# Patient Record
Sex: Male | Born: 1945 | Race: White | Hispanic: No | Marital: Married | Smoking: Former smoker
Health system: Southern US, Community
[De-identification: ages and names within clinical notes are randomized; demographics above are authoritative.]

## PROBLEM LIST (undated history)

## (undated) DIAGNOSIS — I454 Nonspecific intraventricular block: Secondary | ICD-10-CM

## (undated) DIAGNOSIS — K219 Gastro-esophageal reflux disease without esophagitis: Secondary | ICD-10-CM

## (undated) DIAGNOSIS — N200 Calculus of kidney: Secondary | ICD-10-CM

## (undated) DIAGNOSIS — E559 Vitamin D deficiency, unspecified: Secondary | ICD-10-CM

## (undated) DIAGNOSIS — Z794 Long term (current) use of insulin: Secondary | ICD-10-CM

## (undated) DIAGNOSIS — I447 Left bundle-branch block, unspecified: Secondary | ICD-10-CM

## (undated) DIAGNOSIS — E538 Deficiency of other specified B group vitamins: Secondary | ICD-10-CM

## (undated) DIAGNOSIS — E291 Testicular hypofunction: Secondary | ICD-10-CM

## (undated) DIAGNOSIS — E041 Nontoxic single thyroid nodule: Secondary | ICD-10-CM

## (undated) DIAGNOSIS — I700 Atherosclerosis of aorta: Secondary | ICD-10-CM

## (undated) DIAGNOSIS — E119 Type 2 diabetes mellitus without complications: Secondary | ICD-10-CM

## (undated) DIAGNOSIS — R0600 Dyspnea, unspecified: Secondary | ICD-10-CM

## (undated) DIAGNOSIS — I517 Cardiomegaly: Secondary | ICD-10-CM

## (undated) DIAGNOSIS — I2510 Atherosclerotic heart disease of native coronary artery without angina pectoris: Secondary | ICD-10-CM

## (undated) DIAGNOSIS — I200 Unstable angina: Secondary | ICD-10-CM

## (undated) DIAGNOSIS — R011 Cardiac murmur, unspecified: Secondary | ICD-10-CM

## (undated) DIAGNOSIS — I452 Bifascicular block: Secondary | ICD-10-CM

## (undated) DIAGNOSIS — K760 Fatty (change of) liver, not elsewhere classified: Secondary | ICD-10-CM

## (undated) DIAGNOSIS — K635 Polyp of colon: Secondary | ICD-10-CM

## (undated) DIAGNOSIS — N183 Chronic kidney disease, stage 3 unspecified: Secondary | ICD-10-CM

## (undated) DIAGNOSIS — G4733 Obstructive sleep apnea (adult) (pediatric): Secondary | ICD-10-CM

## (undated) DIAGNOSIS — I509 Heart failure, unspecified: Secondary | ICD-10-CM

## (undated) DIAGNOSIS — Z7902 Long term (current) use of antithrombotics/antiplatelets: Secondary | ICD-10-CM

## (undated) DIAGNOSIS — Z9289 Personal history of other medical treatment: Secondary | ICD-10-CM

## (undated) DIAGNOSIS — E785 Hyperlipidemia, unspecified: Secondary | ICD-10-CM

## (undated) DIAGNOSIS — I272 Pulmonary hypertension, unspecified: Secondary | ICD-10-CM

## (undated) DIAGNOSIS — Z7901 Long term (current) use of anticoagulants: Secondary | ICD-10-CM

## (undated) DIAGNOSIS — M1990 Unspecified osteoarthritis, unspecified site: Secondary | ICD-10-CM

## (undated) DIAGNOSIS — I440 Atrioventricular block, first degree: Secondary | ICD-10-CM

## (undated) DIAGNOSIS — D649 Anemia, unspecified: Secondary | ICD-10-CM

## (undated) DIAGNOSIS — K429 Umbilical hernia without obstruction or gangrene: Secondary | ICD-10-CM

## (undated) DIAGNOSIS — I4891 Unspecified atrial fibrillation: Secondary | ICD-10-CM

## (undated) DIAGNOSIS — K4090 Unilateral inguinal hernia, without obstruction or gangrene, not specified as recurrent: Secondary | ICD-10-CM

## (undated) DIAGNOSIS — N62 Hypertrophy of breast: Secondary | ICD-10-CM

## (undated) DIAGNOSIS — N189 Chronic kidney disease, unspecified: Secondary | ICD-10-CM

## (undated) DIAGNOSIS — I10 Essential (primary) hypertension: Secondary | ICD-10-CM

## (undated) DIAGNOSIS — G4730 Sleep apnea, unspecified: Secondary | ICD-10-CM

## (undated) HISTORY — PX: APPENDECTOMY: SHX54

## (undated) HISTORY — PX: FRACTURE SURGERY: SHX138

## (undated) HISTORY — PX: COLON SURGERY: SHX602

---

## 2006-08-09 ENCOUNTER — Ambulatory Visit: Payer: Self-pay | Admitting: Internal Medicine

## 2007-10-18 ENCOUNTER — Emergency Department: Payer: Self-pay | Admitting: Emergency Medicine

## 2007-11-05 ENCOUNTER — Other Ambulatory Visit: Payer: Self-pay

## 2007-11-05 ENCOUNTER — Ambulatory Visit: Payer: Self-pay | Admitting: Specialist

## 2007-11-19 ENCOUNTER — Ambulatory Visit: Payer: Self-pay | Admitting: Specialist

## 2009-07-17 ENCOUNTER — Inpatient Hospital Stay: Payer: Self-pay | Admitting: Internal Medicine

## 2009-10-12 ENCOUNTER — Ambulatory Visit: Payer: Self-pay | Admitting: Gastroenterology

## 2010-09-11 ENCOUNTER — Ambulatory Visit: Payer: Self-pay | Admitting: Internal Medicine

## 2010-11-07 ENCOUNTER — Ambulatory Visit: Payer: Self-pay | Admitting: Gastroenterology

## 2010-11-09 LAB — PATHOLOGY REPORT

## 2011-02-09 ENCOUNTER — Ambulatory Visit: Payer: Self-pay | Admitting: Internal Medicine

## 2011-02-27 ENCOUNTER — Ambulatory Visit: Payer: Self-pay | Admitting: Internal Medicine

## 2011-03-26 ENCOUNTER — Ambulatory Visit: Payer: Self-pay | Admitting: Internal Medicine

## 2011-04-18 ENCOUNTER — Ambulatory Visit: Payer: Self-pay | Admitting: Internal Medicine

## 2011-10-05 ENCOUNTER — Ambulatory Visit: Payer: Self-pay | Admitting: Internal Medicine

## 2012-04-22 ENCOUNTER — Ambulatory Visit: Payer: Self-pay | Admitting: Gastroenterology

## 2012-07-22 ENCOUNTER — Ambulatory Visit: Payer: Self-pay | Admitting: Gastroenterology

## 2012-08-04 ENCOUNTER — Ambulatory Visit: Payer: Self-pay | Admitting: Surgery

## 2012-08-04 DIAGNOSIS — I10 Essential (primary) hypertension: Secondary | ICD-10-CM

## 2012-08-04 LAB — COMPREHENSIVE METABOLIC PANEL
Anion Gap: 7 (ref 7–16)
BUN: 17 mg/dL (ref 7–18)
Bilirubin,Total: 0.3 mg/dL (ref 0.2–1.0)
Chloride: 105 mmol/L (ref 98–107)
Creatinine: 1.03 mg/dL (ref 0.60–1.30)
EGFR (African American): >60
Osmolality: 281 (ref 275–301)
Potassium: 4.1 mmol/L (ref 3.5–5.1)
SGOT(AST): 24 U/L (ref 15–37)
SGPT (ALT): 33 U/L (ref 12–78)
Sodium: 139 mmol/L (ref 136–145)
Total Protein: 7.6 g/dL (ref 6.4–8.2)

## 2012-08-12 ENCOUNTER — Inpatient Hospital Stay: Payer: Self-pay | Admitting: Surgery

## 2012-08-12 DIAGNOSIS — Z9049 Acquired absence of other specified parts of digestive tract: Secondary | ICD-10-CM

## 2012-08-12 HISTORY — DX: Acquired absence of other specified parts of digestive tract: Z90.49

## 2012-08-12 HISTORY — PX: LAPAROSCOPIC PARTIAL RIGHT COLECTOMY: SHX5913

## 2012-08-12 LAB — PLATELET COUNT: Platelet: 205 10*3/uL (ref 150–440)

## 2012-08-15 LAB — PATHOLOGY REPORT

## 2012-08-16 LAB — PLATELET COUNT: Platelet: 205 10*3/uL (ref 150–440)

## 2013-02-23 IMAGING — CT CT PARANASAL SINUSES W/O CM
1 series · 16 of 30 positions shown, 20 images · non-contrast
Comparison: none

REASON FOR EXAM: persistent drainage and cough
COMMENTS:

PROCEDURE:     KCT - KCT SINUSES WITHOUT CONTRAST  - April 18, 2011  [DATE]
RESULT:
TECHNIQUE: Multiplanar imaging of the paranasal sinuses was obtained
utilizing helical 2 mm acquisition and bone reconstruction algorithm.

[Series 2: osteo (id) · axial · 0.40mm/px · z∈[-116,+20]mm · 16 of 99 slices shown, 20 images]
[im 4/99  brain]
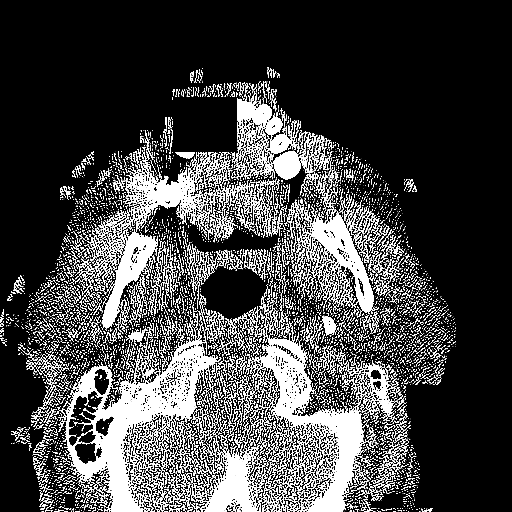
[im 4/99  bone]
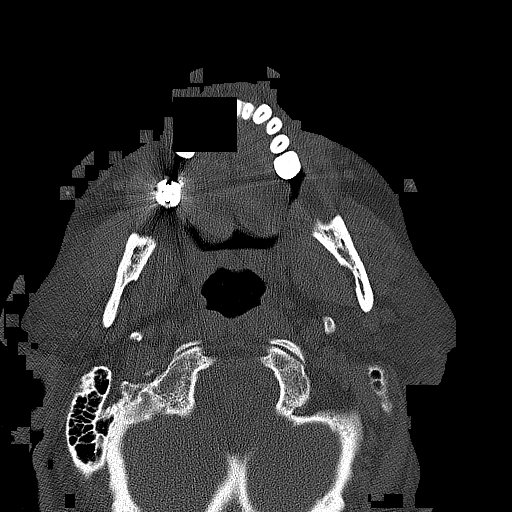
[im 11/99  bone]
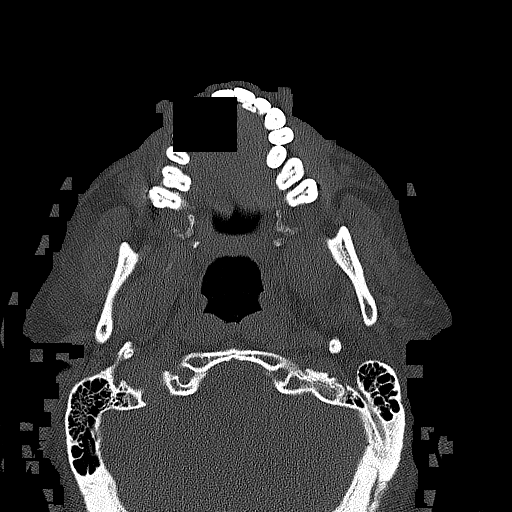
[im 17/99  bone]
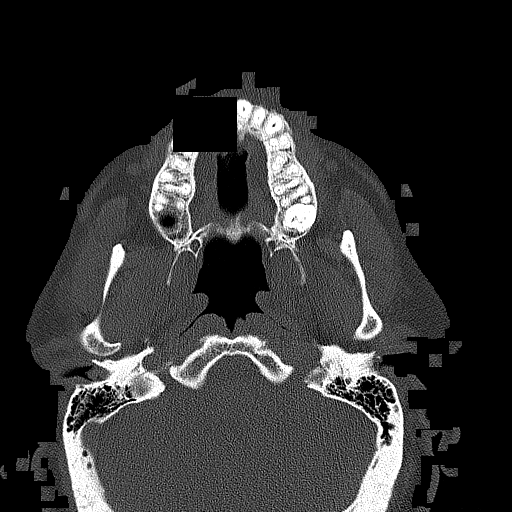
[im 24/99  bone]
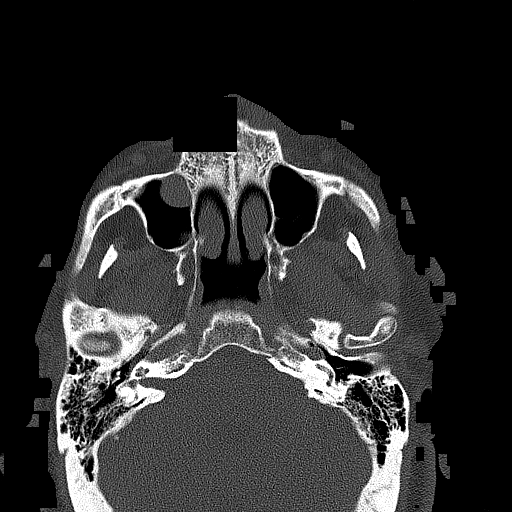
[im 28/99  brain]
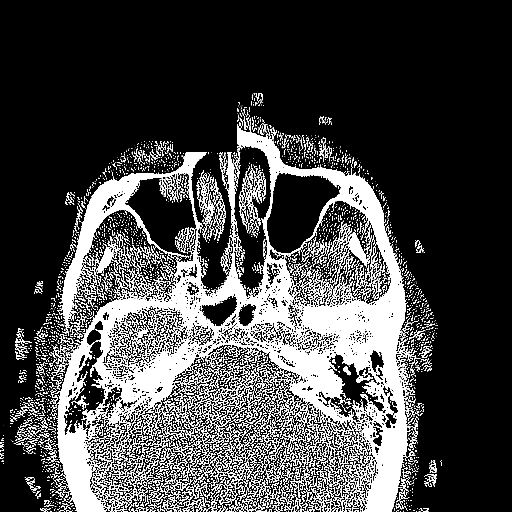
[im 28/99  bone]
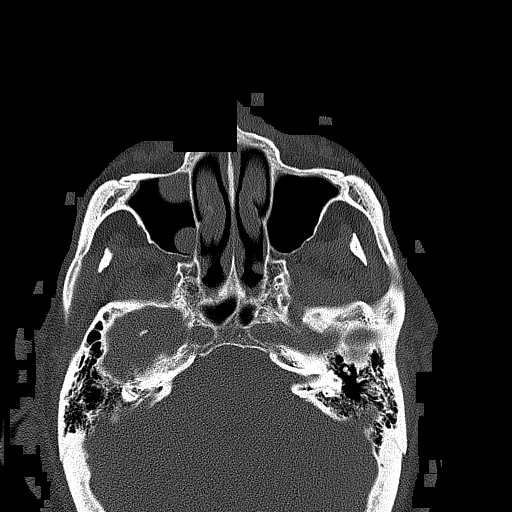
[im 34/99  bone]
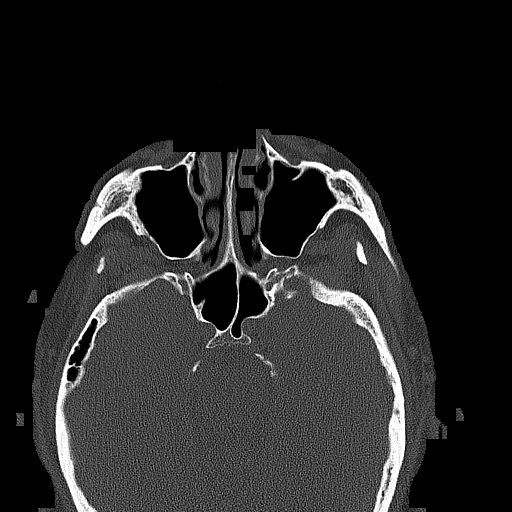
[im 41/99  bone]
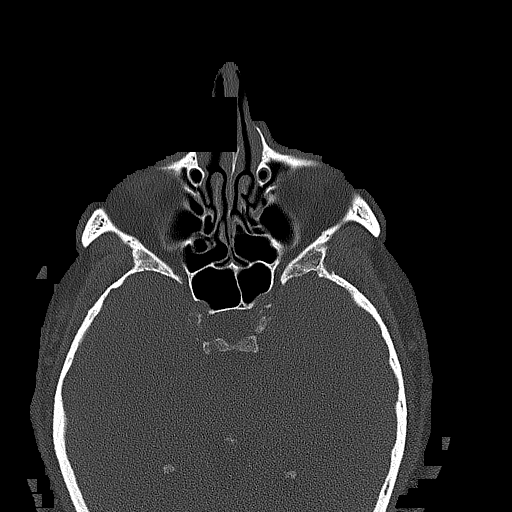
[im 48/99  bone]
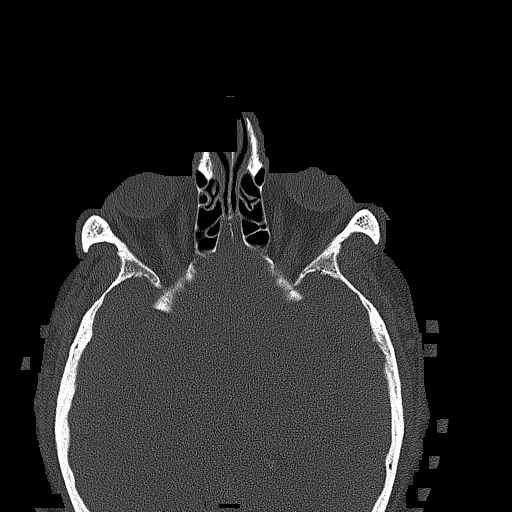
[im 51/99  brain]
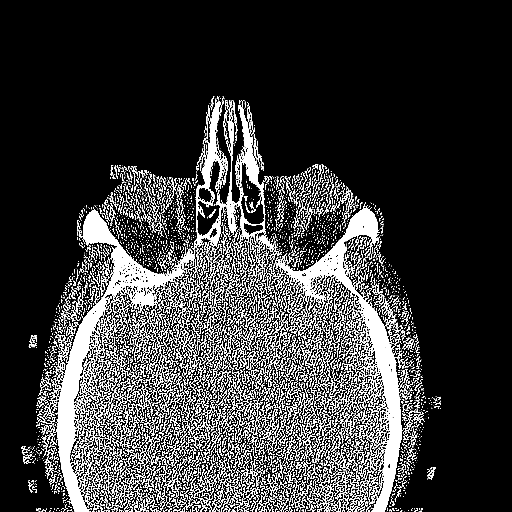
[im 51/99  bone]
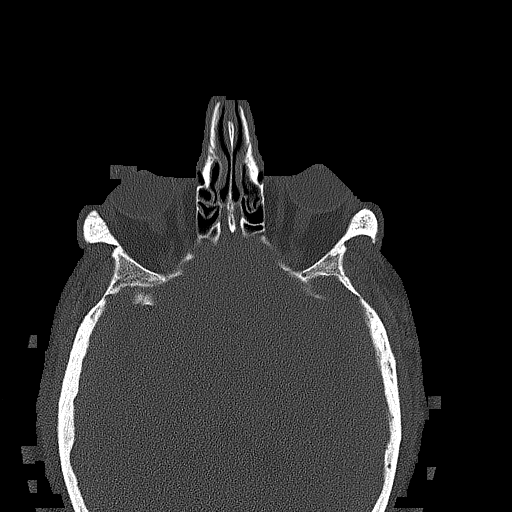
[im 58/99  bone]
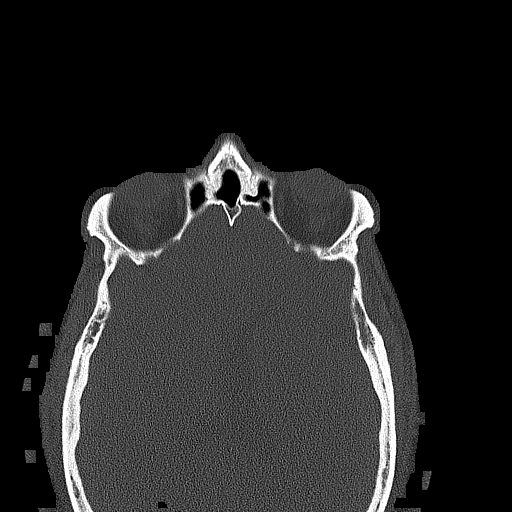
[im 65/99  bone]
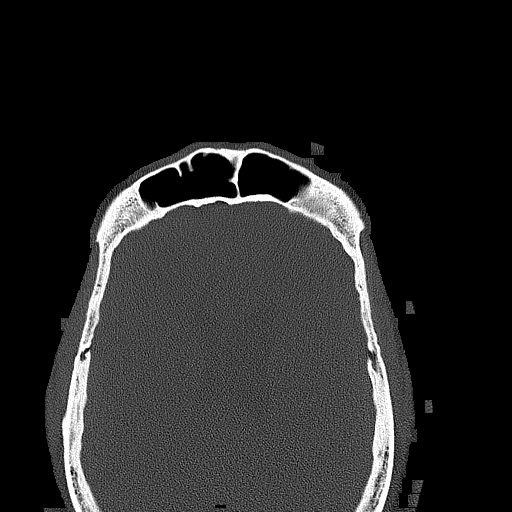
[im 71/99  bone]
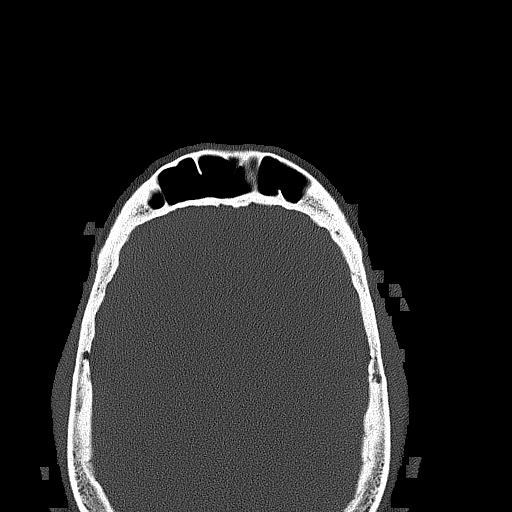
[im 75/99  brain]
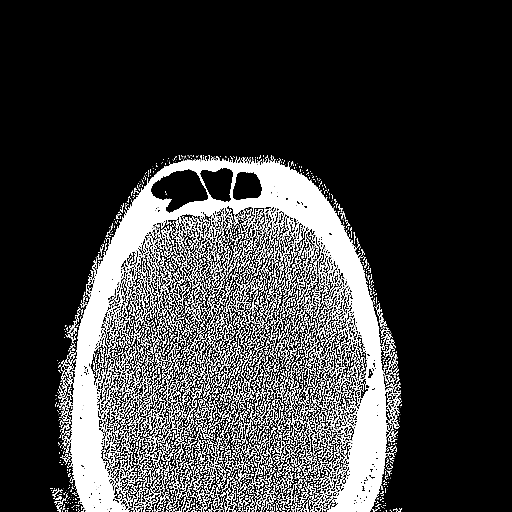
[im 75/99  bone]
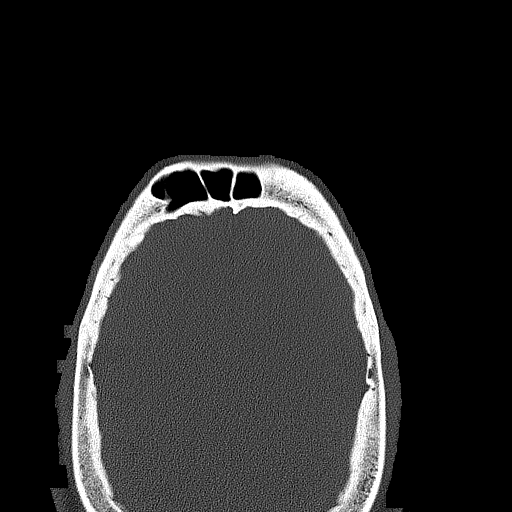
[im 82/99  bone]
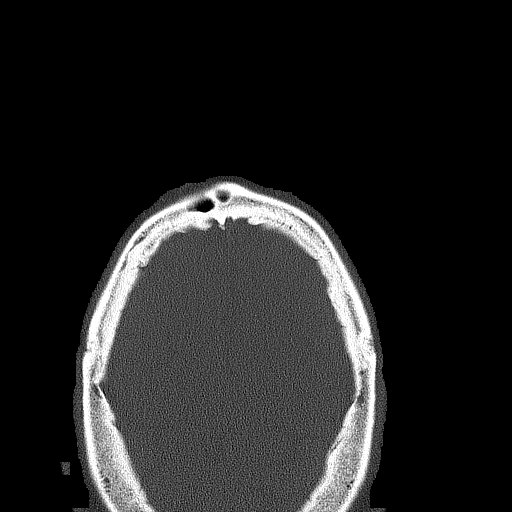
[im 88/99  bone]
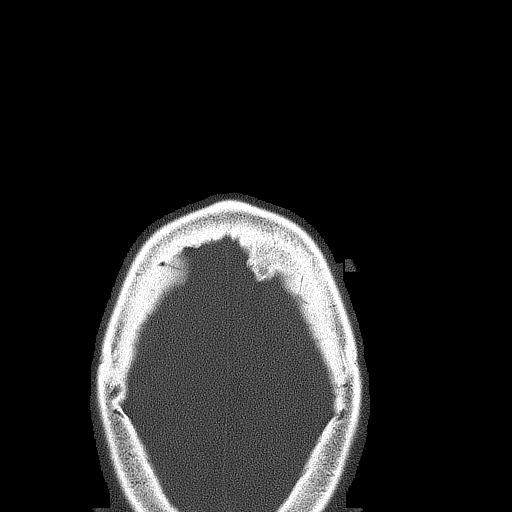
[im 95/99  bone]
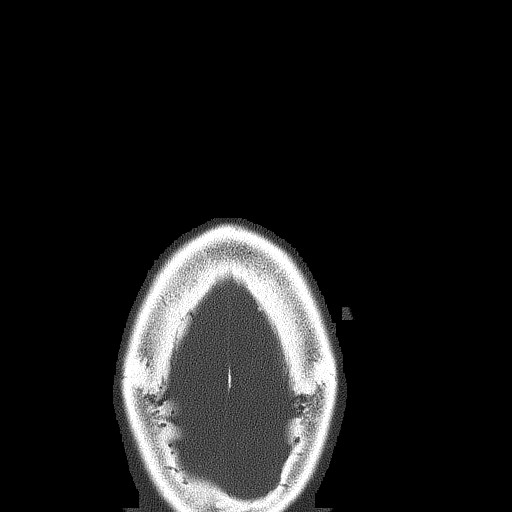

[16 of 30 positions shown; findings below may reference images not displayed]

FINDINGS: Two, rounded, bulbous, low attenuating areas project within the
medial periphery of the right maxillary sinus. Differential considerations
are mucus retention cysts versus polyps. A smaller area is demonstrated
within the posterior base of the left maxillary sinus. The paranasal sinuses
are otherwise clear without evidence of air-fluid levels or mucosal
thickening. The ostiomeatal complex is unremarkable. Rounded areas of low
attenuation project within the deep portion of the outer ear canals. These
areas may represent cerumen.
IMPRESSION: 1.  No CT evidence of paranasal sinus disease.
2.  Findings likely representing polyps versus mucus retention cysts in the
right maxillary sinus.

## 2013-11-05 DIAGNOSIS — Z86010 Personal history of colonic polyps: Secondary | ICD-10-CM | POA: Insufficient documentation

## 2013-12-04 ENCOUNTER — Ambulatory Visit: Payer: Self-pay | Admitting: Gastroenterology

## 2013-12-08 LAB — PATHOLOGY REPORT

## 2014-12-03 NOTE — Discharge Summary (Signed)
PATIENT NAME:  [name], [name] MR#:  [id number] DATE OF BIRTH:  09/13/1945  DATE OF ADMISSION:  08/12/2012 DATE OF DISCHARGE:  08/16/2012  HISTORY OF PRESENT ILLNESS: This 69 year old male recently had a screening colonoscopy. He had findings of a 4-cm carpet-like mass of the ascending colon, which could not be removed with colonoscopy, had biopsy findings of tubulovillous adenoma.   PAST MEDICAL HISTORY: Diabetes, hypertension, sleep apnea. Details of the past history and physical were recorded on the typed history and physical.   MEDICATIONS: Recorded.   The patient had a bowel preparation at home and came in through the outpatient surgery department, did have a preop prophylactic antibiotic. Had a laparoscopic right colectomy.  Postoperatively, he was treated with IV fluids, analgesics and subcutaneous heparin, and made satisfactory progress. He had minimal discomfort in the postoperative period.  Soon began a liquid diet and gradually advanced to solid food. Did demonstrate bowel activity prior to discharge.   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   OPERATION: Laparoscopic right colectomy.   Wound care instructions were given.  Plans were made for a followup in the office for removal of skin clips.  Continue his usual diabetic diet.    ____________________________ Lenna Sciara. Rochel Brome, MD jws:th D: 08/28/2012 17:54:26 ET T: 08/28/2012 20:39:05 ET JOB#: [id number]  cc: Loreli Dollar, MD, <Dictator> Loreli Dollar MD ELECTRONICALLY SIGNED 08/29/2012 16:59

## 2014-12-03 NOTE — Op Note (Signed)
ADDENDUM  PATIENT NAME:  [name], [name] MR#:  [id number] DATE OF BIRTH:  1946-06-09  DATE OF PROCEDURE:  08/12/2012  Addendum: I have reviewed the operative note and found that it needs correction. The correct preoperative diagnosis is sessile polyp of the ascending colon and the correct postoperative diagnosis is sessile polyp of the ascending colon.    ____________________________ Lenna Sciara. Rochel Brome, MD jws:es D: 08/18/2012 19:35:36 ET T: 08/19/2012 11:15:37 ET JOB#: [id number]  cc: Loreli Dollar, MD, <Dictator> Loreli Dollar MD ELECTRONICALLY SIGNED 08/22/2012 18:58

## 2014-12-03 NOTE — Op Note (Signed)
PATIENT NAME:  [name], [name] MR#:  [id number] DATE OF BIRTH:  1946-06-03  DATE OF PROCEDURE:  08/12/2012  PREOPERATIVE DIAGNOSIS: Sessile polyp of the descending colon.   POSTOPERATIVE DIAGNOSIS: Sessile polyp of the descending colon.   PROCEDURE: Laparoscopic right colectomy.   SURGEON: Rochel Brome, M.D.   ANESTHESIA: General.   INDICATION: This 69 year old male recently had colonoscopy with findings of a 4 cm carpet-like mass of the ascending colon. Biopsy demonstrated tubulovillous adenoma. Surgery was recommended for definitive treatment. I did note he had an umbilical hernia which was not symptomatic and was small. Did also have findings of diastasis recti.   DESCRIPTION OF PROCEDURE: The patient was placed on the operating room table in the supine position under general endotracheal anesthesia. The abdomen was prepared with clippers and ChloraPrep, and draped in a sterile manner. A short incision was made just below the umbilicus and carried down to the deep fascia which was grasped with laryngeal hook and elevated. A Veress needle was inserted, aspirated, and irrigated with a saline solution. Next, the peritoneal cavity was insufflated with carbon dioxide. The Veress needle was removed. The 10 mm cannula was inserted. The 10 mm, 0-degree laparoscope was inserted to view the peritoneal cavity. Initially noted omentum covered most of intestines. Two additional port sites were placed in the epigastric midline. Each site inserted an 11 mm cannula and also another 11 mm cannula was inserted in the lateral aspect of the right mid-abdomen for a total of 4 port sites. The liver appeared normal. The omentum was reflected up over the transverse colon, over the stomach exposing the transverse colon. Further examination demonstrated there was scarring in the right lower quadrant which appeared to be related to history of appendectomy. Multiple adhesions in the right lower quadrant were taken down with  scissors and with Harmonic scalpel mobilizing the small bowel off the anterior abdominal wall, and also adhesions between the cecum and the anterior abdominal wall. Next, the right colon was mobilized with incision of the peritoneal reflection. Next, a portion of the omentum was dissected away from the right transverse colon, the right transverse colon was dissected away from the gallbladder, and the hepatic flexure was mobilized. Harmonic scalpel was used for much of the dissection. Next, additional dissection was carried out mobilizing the terminal ileum. There were a number of adhesions which were lysed and this was further mobilized and subsequently additional dissection was carried out mobilizing the right transverse colon and dissecting the mesentery away from the duodenum, and after satisfactory mobilization so that the right colon could be passed far over to the left, the laparoscopic instruments were subsequently removed. An incision was made from one epigastric port to the other so that the incision was approximately 3 inches in length, carried down through the midline fascia and the peritoneum, and subsequently brought the right colon out on the abdominal wall. I had noted the ink mark previously, but the ink mark was further noted in the ascending colon. Next, a portion of the omentum in the midline was divided. The middle colic vessels were palpated. Just to the right of the middle colic vessels, a window was created in the transverse mesocolon. Also, further examining the terminal ileum, a window was created in the mesentery just about 3 inches proximal to the ileocecal valve. The mesenteric dissection was carried out with the Harmonic scalpel, dissecting the mesentery of the terminal ileum and mesentery of the transverse colon. The ileocolic vessels were suture ligated with 0  chromic and also two other vessels were ligated with 0 chromic, and each of these were also divided with the Harmonic scalpel.  Subsequently, the terminal ileum was placed adjacent to the transverse colon at the intended site of the anastomosis anticipating anti-mesenteric side-to-side functional end-to-end anastomosis. The ileum was grasped with an Allis clamp and also the transverse colon enterotomy and a colotomy were made with electrocautery. The 75 mm GIA stapler was introduced, engaged, and activated to begin the anastomosis. The staple line appeared to be hemostatic. Next, the anastomosis was completed with the application of the WC-58 stapler which was placed perpendicular to the first, engaged, and activated, and the specimen was excised with a scalpel and passed off to a side table. The anastomosis was inspected and looked good. It was widely patent. One point was imbricated with 5-0 Vicryl. Hemostasis was intact. The mesentery was closed with a running 3-0 chromic.   Next, the specimen was examined on a side table and made a 3 inch incision in the colon and everted the mucosa demonstrating an approximately 3 cm sessile polyp in the ascending colon near the ink mark and this was submitted in formalin for routine pathology.   Gloves were changed and next the operative site was examined. Hemostasis was intact. A pull suction was introduced along the right abdomen and there was no significant amount of blood found. Next, the omentum was brought beneath the wound and the midline fascia was closed with interrupted inverted 0 Maxon figure-of-eight sutures closing the fascia. Next, all skin incisions were closed with clips, dressings were applied with paper tape. The patient tolerated surgery satisfactorily and was then prepared for transfer to the recovery room.     ____________________________ Lenna Sciara. Rochel Brome, MD jws:es D: 08/12/2012 13:10:34 ET T: 08/12/2012 13:49:18 ET JOB#: [id number]  cc: Loreli Dollar, MD, <Dictator> Loreli Dollar MD ELECTRONICALLY SIGNED 08/14/2012 16:08

## 2016-07-25 DIAGNOSIS — I350 Nonrheumatic aortic (valve) stenosis: Secondary | ICD-10-CM

## 2016-07-25 DIAGNOSIS — I5030 Unspecified diastolic (congestive) heart failure: Secondary | ICD-10-CM

## 2016-07-25 HISTORY — DX: Unspecified diastolic (congestive) heart failure: I50.30

## 2016-07-25 HISTORY — DX: Nonrheumatic aortic (valve) stenosis: I35.0

## 2016-08-16 ENCOUNTER — Ambulatory Visit: Admit: 2016-08-16 | Payer: Medicare Other | Admitting: Internal Medicine

## 2016-08-16 ENCOUNTER — Encounter: Payer: Self-pay | Admitting: *Deleted

## 2016-08-16 ENCOUNTER — Encounter
Admission: RE | Disposition: A | Discharge status: Home / Self Care | Payer: Self-pay | Source: Ambulatory Visit | Attending: Internal Medicine

## 2016-08-16 ENCOUNTER — Ambulatory Visit
Admission: RE | Admit: 2016-08-16 | Discharge: 2016-08-16 | Disposition: A | Discharge status: Home / Self Care | Payer: Medicare Other | Source: Ambulatory Visit | Attending: Internal Medicine | Admitting: Internal Medicine

## 2016-08-16 DIAGNOSIS — R600 Localized edema: Secondary | ICD-10-CM | POA: Diagnosis not present

## 2016-08-16 DIAGNOSIS — E119 Type 2 diabetes mellitus without complications: Secondary | ICD-10-CM | POA: Diagnosis not present

## 2016-08-16 DIAGNOSIS — K219 Gastro-esophageal reflux disease without esophagitis: Secondary | ICD-10-CM | POA: Insufficient documentation

## 2016-08-16 DIAGNOSIS — I10 Essential (primary) hypertension: Secondary | ICD-10-CM | POA: Diagnosis not present

## 2016-08-16 DIAGNOSIS — Z7984 Long term (current) use of oral hypoglycemic drugs: Secondary | ICD-10-CM | POA: Diagnosis not present

## 2016-08-16 DIAGNOSIS — G4733 Obstructive sleep apnea (adult) (pediatric): Secondary | ICD-10-CM | POA: Diagnosis not present

## 2016-08-16 DIAGNOSIS — I2510 Atherosclerotic heart disease of native coronary artery without angina pectoris: Secondary | ICD-10-CM

## 2016-08-16 DIAGNOSIS — Z6837 Body mass index (BMI) 37.0-37.9, adult: Secondary | ICD-10-CM | POA: Diagnosis not present

## 2016-08-16 DIAGNOSIS — I25110 Atherosclerotic heart disease of native coronary artery with unstable angina pectoris: Secondary | ICD-10-CM | POA: Diagnosis not present

## 2016-08-16 DIAGNOSIS — Z87891 Personal history of nicotine dependence: Secondary | ICD-10-CM | POA: Insufficient documentation

## 2016-08-16 DIAGNOSIS — Z7982 Long term (current) use of aspirin: Secondary | ICD-10-CM | POA: Diagnosis not present

## 2016-08-16 DIAGNOSIS — Z79899 Other long term (current) drug therapy: Secondary | ICD-10-CM | POA: Insufficient documentation

## 2016-08-16 DIAGNOSIS — R079 Chest pain, unspecified: Secondary | ICD-10-CM | POA: Diagnosis present

## 2016-08-16 DIAGNOSIS — E669 Obesity, unspecified: Secondary | ICD-10-CM | POA: Diagnosis not present

## 2016-08-16 HISTORY — PX: CARDIAC CATHETERIZATION: SHX172

## 2016-08-16 HISTORY — DX: Unspecified osteoarthritis, unspecified site: M19.90

## 2016-08-16 HISTORY — DX: Sleep apnea, unspecified: G47.30

## 2016-08-16 HISTORY — DX: Atherosclerotic heart disease of native coronary artery without angina pectoris: I25.10

## 2016-08-16 HISTORY — DX: Gastro-esophageal reflux disease without esophagitis: K21.9

## 2016-08-16 HISTORY — DX: Type 2 diabetes mellitus without complications: E11.9

## 2016-08-16 LAB — GLUCOSE, CAPILLARY: GLUCOSE-CAPILLARY: 148 mg/dL — AB (ref 65–99)

## 2016-08-16 SURGERY — LEFT HEART CATH AND CORONARY ANGIOGRAPHY
Anesthesia: Moderate Sedation

## 2016-08-16 SURGERY — LEFT HEART CATH AND CORONARY ANGIOGRAPHY
Anesthesia: Moderate Sedation | Laterality: Left

## 2016-08-16 MED ORDER — SODIUM CHLORIDE 0.9 % WEIGHT BASED INFUSION: 3.0000 mL/kg/h | INTRAVENOUS | Status: DC

## 2016-08-16 MED ORDER — ONDANSETRON HCL 4 MG/2ML IJ SOLN
4.0000 mg | Freq: Four times a day (QID) | INTRAMUSCULAR | Status: DC | PRN
Start: 2016-08-16 — End: 2016-08-16

## 2016-08-16 MED ORDER — FENTANYL CITRATE (PF) 100 MCG/2ML IJ SOLN
INTRAMUSCULAR | Status: DC | PRN
  Administered 2016-08-16: 50 ug via INTRAVENOUS

## 2016-08-16 MED ORDER — ASPIRIN 81 MG PO CHEW: 81.0000 mg | CHEWABLE_TABLET | ORAL | Status: DC

## 2016-08-16 MED ORDER — SODIUM CHLORIDE 0.9% FLUSH: 3.0000 mL | Freq: Two times a day (BID) | INTRAVENOUS | Status: DC

## 2016-08-16 MED ORDER — MIDAZOLAM HCL 2 MG/2ML IJ SOLN
INTRAMUSCULAR | Status: AC
  Filled 2016-08-16: qty 2

## 2016-08-16 MED ORDER — SODIUM CHLORIDE 0.9 % IV SOLN: 250.0000 mL | INTRAVENOUS | Status: DC | PRN

## 2016-08-16 MED ORDER — ACETAMINOPHEN 325 MG PO TABS: 650.0000 mg | ORAL_TABLET | ORAL | Status: DC | PRN

## 2016-08-16 MED ORDER — HEPARIN (PORCINE) IN NACL 2-0.9 UNIT/ML-% IJ SOLN
INTRAMUSCULAR | Status: AC
  Filled 2016-08-16: qty 500

## 2016-08-16 MED ORDER — SODIUM CHLORIDE 0.9% FLUSH: 3.0000 mL | INTRAVENOUS | Status: DC | PRN

## 2016-08-16 MED ORDER — MIDAZOLAM HCL 2 MG/2ML IJ SOLN
INTRAMUSCULAR | Status: DC | PRN
  Administered 2016-08-16: 1 mg via INTRAVENOUS

## 2016-08-16 MED ORDER — SODIUM CHLORIDE 0.9 % WEIGHT BASED INFUSION: 1.0000 mL/kg/h | INTRAVENOUS | Status: DC

## 2016-08-16 MED ORDER — FENTANYL CITRATE (PF) 100 MCG/2ML IJ SOLN
INTRAMUSCULAR | Status: AC
  Filled 2016-08-16: qty 2

## 2016-08-16 SURGICAL SUPPLY — 12 items
CATH 5FR JL4 DIAGNOSTIC (CATHETERS) ×3 IMPLANT
CATH 5FR PIGTAIL DIAGNOSTIC (CATHETERS) ×2 IMPLANT
CATH INFINITI 5FR JL5 (CATHETERS) ×3 IMPLANT
CATH INFINITI JR4 5F (CATHETERS) ×3 IMPLANT
DEVICE CLOSURE MYNXGRIP 5F (Vascular Products) IMPLANT
DEVICE CLOSURE MYNXGRIP 6/7F (Vascular Products) ×3 IMPLANT
DEVICE INFLAT 30 PLUS (MISCELLANEOUS) IMPLANT
KIT MANI 3VAL PERCEP (MISCELLANEOUS) ×3 IMPLANT
NEEDLE PERC 18GX7CM (NEEDLE) ×3 IMPLANT
PACK CARDIAC CATH (CUSTOM PROCEDURE TRAY) ×3 IMPLANT
SHEATH AVANTI 6FR X 11CM (SHEATH) ×3 IMPLANT
WIRE EMERALD 3MM-J .035X150CM (WIRE) ×3 IMPLANT

## 2016-08-16 NOTE — Discharge Instructions (Signed)

## 2016-08-16 NOTE — Progress Notes (Signed)
Discussed with pt follow up instructions. Pt aware that [facility] will be contacting pt about follow up procedure for Cardiac status. All questions answered. Pt states he understands. Discharge instructions given for Minx closure device. Pt again states he understands. Pt tol procedure well. Dressing dry and intact upon discharge. resp regular and unlabored. Pink warm and dry.

## 2016-08-17 ENCOUNTER — Encounter: Payer: Self-pay | Admitting: Internal Medicine

## 2016-08-17 LAB — CARDIAC CATHETERIZATION: CATHEFQUANT: 60 %

## 2016-08-17 MED ORDER — IOPAMIDOL (ISOVUE-300) INJECTION 61%
INTRAVENOUS | Status: DC | PRN
  Administered 2016-08-16: 150 mL via INTRA_ARTERIAL

## 2016-08-20 DIAGNOSIS — I2510 Atherosclerotic heart disease of native coronary artery without angina pectoris: Secondary | ICD-10-CM | POA: Insufficient documentation

## 2016-08-30 DIAGNOSIS — I440 Atrioventricular block, first degree: Secondary | ICD-10-CM | POA: Insufficient documentation

## 2016-08-30 DIAGNOSIS — Z951 Presence of aortocoronary bypass graft: Secondary | ICD-10-CM | POA: Insufficient documentation

## 2016-08-30 DIAGNOSIS — I454 Nonspecific intraventricular block: Secondary | ICD-10-CM | POA: Insufficient documentation

## 2016-08-30 DIAGNOSIS — E669 Obesity, unspecified: Secondary | ICD-10-CM | POA: Insufficient documentation

## 2016-08-30 HISTORY — DX: Presence of aortocoronary bypass graft: Z95.1

## 2016-08-30 HISTORY — PX: CORONARY ARTERY BYPASS GRAFT: SHX141

## 2016-09-08 DIAGNOSIS — Z9189 Other specified personal risk factors, not elsewhere classified: Secondary | ICD-10-CM | POA: Insufficient documentation

## 2016-10-01 ENCOUNTER — Encounter: Payer: Medicare Other | Attending: Internal Medicine | Admitting: *Deleted

## 2016-10-01 DIAGNOSIS — Z951 Presence of aortocoronary bypass graft: Secondary | ICD-10-CM

## 2016-10-01 NOTE — Progress Notes (Signed)
Incomplete Session Note  Patient Details  Name: [name] MRN: [id number] Date of Birth: [birth year] Referring Provider:    [name] did not complete his rehab session.  Arrived and told staff he is experiencing symptoms on left side of chest with exertion that does resolve with rest. He stated that he talked to his surgeon about the symptoms and was told it is probably form the surgery.  After talking to Theo in depth, the symptoms are very close to his cardiac symptoms prior to the surgery. He was advised to call his cardiologist and review the symptoms with him.

## 2016-11-20 ENCOUNTER — Encounter
Admission: RE | Disposition: A | Discharge status: Home / Self Care | Payer: Self-pay | Source: Ambulatory Visit | Attending: Internal Medicine

## 2016-11-20 ENCOUNTER — Ambulatory Visit
Admission: RE | Admit: 2016-11-20 | Discharge: 2016-11-20 | Disposition: A | Discharge status: Home / Self Care | Payer: Medicare Other | Source: Ambulatory Visit | Attending: Internal Medicine | Admitting: Internal Medicine

## 2016-11-20 DIAGNOSIS — Z79899 Other long term (current) drug therapy: Secondary | ICD-10-CM | POA: Diagnosis not present

## 2016-11-20 DIAGNOSIS — Z87891 Personal history of nicotine dependence: Secondary | ICD-10-CM | POA: Insufficient documentation

## 2016-11-20 DIAGNOSIS — E669 Obesity, unspecified: Secondary | ICD-10-CM | POA: Diagnosis not present

## 2016-11-20 DIAGNOSIS — Z7982 Long term (current) use of aspirin: Secondary | ICD-10-CM | POA: Insufficient documentation

## 2016-11-20 DIAGNOSIS — R079 Chest pain, unspecified: Secondary | ICD-10-CM | POA: Diagnosis present

## 2016-11-20 DIAGNOSIS — Z6834 Body mass index (BMI) 34.0-34.9, adult: Secondary | ICD-10-CM | POA: Diagnosis not present

## 2016-11-20 DIAGNOSIS — Z951 Presence of aortocoronary bypass graft: Secondary | ICD-10-CM | POA: Insufficient documentation

## 2016-11-20 DIAGNOSIS — I10 Essential (primary) hypertension: Secondary | ICD-10-CM | POA: Diagnosis not present

## 2016-11-20 DIAGNOSIS — G4730 Sleep apnea, unspecified: Secondary | ICD-10-CM | POA: Insufficient documentation

## 2016-11-20 DIAGNOSIS — K219 Gastro-esophageal reflux disease without esophagitis: Secondary | ICD-10-CM | POA: Insufficient documentation

## 2016-11-20 DIAGNOSIS — Z7984 Long term (current) use of oral hypoglycemic drugs: Secondary | ICD-10-CM | POA: Insufficient documentation

## 2016-11-20 DIAGNOSIS — E119 Type 2 diabetes mellitus without complications: Secondary | ICD-10-CM | POA: Diagnosis not present

## 2016-11-20 DIAGNOSIS — I25709 Atherosclerosis of coronary artery bypass graft(s), unspecified, with unspecified angina pectoris: Secondary | ICD-10-CM | POA: Diagnosis not present

## 2016-11-20 HISTORY — PX: LEFT HEART CATH AND CORS/GRAFTS ANGIOGRAPHY: CATH118250

## 2016-11-20 LAB — GLUCOSE, CAPILLARY: Glucose-Capillary: 194 mg/dL — ABNORMAL HIGH (ref 65–99)

## 2016-11-20 SURGERY — LEFT HEART CATH AND CORS/GRAFTS ANGIOGRAPHY
Anesthesia: Moderate Sedation | Laterality: Left

## 2016-11-20 SURGERY — LEFT HEART CATH AND CORS/GRAFTS ANGIOGRAPHY
Anesthesia: Moderate Sedation

## 2016-11-20 MED ORDER — LABETALOL HCL 5 MG/ML IV SOLN
INTRAVENOUS | Status: AC
  Filled 2016-11-20: qty 4

## 2016-11-20 MED ORDER — MIDAZOLAM HCL 2 MG/2ML IJ SOLN
INTRAMUSCULAR | Status: DC | PRN
  Administered 2016-11-20: 1 mg via INTRAVENOUS

## 2016-11-20 MED ORDER — ASPIRIN 81 MG PO CHEW: 81.0000 mg | CHEWABLE_TABLET | ORAL | Status: DC

## 2016-11-20 MED ORDER — SODIUM CHLORIDE 0.9% FLUSH: 3.0000 mL | Freq: Two times a day (BID) | INTRAVENOUS | Status: DC

## 2016-11-20 MED ORDER — LIDOCAINE HCL (PF) 1 % IJ SOLN
INTRAMUSCULAR | Status: DC | PRN
  Administered 2016-11-20: 20 mL via SUBCUTANEOUS

## 2016-11-20 MED ORDER — ONDANSETRON HCL 4 MG/2ML IJ SOLN: 4.0000 mg | Freq: Four times a day (QID) | INTRAMUSCULAR | Status: DC | PRN

## 2016-11-20 MED ORDER — LIDOCAINE HCL (PF) 1 % IJ SOLN
INTRAMUSCULAR | Status: AC
  Filled 2016-11-20: qty 30

## 2016-11-20 MED ORDER — SODIUM CHLORIDE 0.9 % IV SOLN: 250.0000 mL | INTRAVENOUS | Status: DC | PRN

## 2016-11-20 MED ORDER — FENTANYL CITRATE (PF) 100 MCG/2ML IJ SOLN
INTRAMUSCULAR | Status: AC
  Filled 2016-11-20: qty 2

## 2016-11-20 MED ORDER — HEPARIN (PORCINE) IN NACL 2-0.9 UNIT/ML-% IJ SOLN
INTRAMUSCULAR | Status: AC
  Filled 2016-11-20: qty 500

## 2016-11-20 MED ORDER — LABETALOL HCL 5 MG/ML IV SOLN
10.0000 mg | Freq: Once | INTRAVENOUS | Status: AC
  Administered 2016-11-20: 10 mg via INTRAVENOUS

## 2016-11-20 MED ORDER — LABETALOL HCL 5 MG/ML IV SOLN
INTRAVENOUS | Status: DC | PRN
  Administered 2016-11-20: 10 mg via INTRAVENOUS

## 2016-11-20 MED ORDER — ACETAMINOPHEN 325 MG PO TABS: 650.0000 mg | ORAL_TABLET | ORAL | Status: DC | PRN

## 2016-11-20 MED ORDER — SODIUM CHLORIDE 0.9 % WEIGHT BASED INFUSION
3.0000 mL/kg/h | INTRAVENOUS | Status: DC
  Administered 2016-11-20: 3 mL/kg/h via INTRAVENOUS

## 2016-11-20 MED ORDER — SODIUM CHLORIDE 0.9% FLUSH: 3.0000 mL | INTRAVENOUS | Status: DC | PRN

## 2016-11-20 MED ORDER — FENTANYL CITRATE (PF) 100 MCG/2ML IJ SOLN
INTRAMUSCULAR | Status: DC | PRN
  Administered 2016-11-20: 50 ug via INTRAVENOUS

## 2016-11-20 MED ORDER — SODIUM CHLORIDE 0.9 % WEIGHT BASED INFUSION: 1.0000 mL/kg/h | INTRAVENOUS | Status: DC

## 2016-11-20 MED ORDER — MIDAZOLAM HCL 2 MG/2ML IJ SOLN
INTRAMUSCULAR | Status: AC
  Filled 2016-11-20: qty 2

## 2016-11-20 SURGICAL SUPPLY — 12 items
CATH 5FR JR4 DIAGNOSTIC (CATHETERS) ×2 IMPLANT
CATH 5FR PIGTAIL DIAGNOSTIC (CATHETERS) ×2 IMPLANT
CATH INFINITI 5 FR IM (CATHETERS) ×2 IMPLANT
CATH INFINITI 5 FR LCB (CATHETERS) ×2 IMPLANT
CATH INFINITI 5 FR MPA2 (CATHETERS) ×2 IMPLANT
CATH INFINITI 5FR JL4 (CATHETERS) ×2 IMPLANT
DEVICE CLOSURE MYNXGRIP 5F (Vascular Products) ×2 IMPLANT
KIT MANI 3VAL PERCEP (MISCELLANEOUS) ×2 IMPLANT
NEEDLE PERC 18GX7CM (NEEDLE) ×2 IMPLANT
PACK CARDIAC CATH (CUSTOM PROCEDURE TRAY) ×2 IMPLANT
SHEATH AVANTI 5FR X 11CM (SHEATH) ×2 IMPLANT
WIRE J 3MM .035X145CM (WIRE) ×2 IMPLANT

## 2016-11-20 NOTE — Progress Notes (Signed)
Patient clinically stable post heart cath per Dr Clayborn Bigness, bp improved post prn labatelol 10mg  iv, right groin without bleeding nor hematoma. Patient';s son concerned regarding patient does not check bp at home, and every day post CABG, has headache and bp elevated even with being on present medication for hypertension. Called and spoke with Dr Clayborn Bigness regarding concerns, instructed patient to take extra 5mg  Benicar this pm and will call new change of bp meds to pharmacy for patient to increase medications. Discharge teaching with return appointment for patient with son present. Questions answered.

## 2016-11-20 NOTE — Discharge Instructions (Signed)

## 2016-11-21 ENCOUNTER — Encounter: Payer: Self-pay | Admitting: Internal Medicine

## 2016-11-26 DIAGNOSIS — Z955 Presence of coronary angioplasty implant and graft: Secondary | ICD-10-CM

## 2016-11-26 HISTORY — DX: Presence of coronary angioplasty implant and graft: Z95.5

## 2016-11-26 HISTORY — PX: CORONARY ANGIOPLASTY WITH STENT PLACEMENT: SHX49

## 2016-12-17 HISTORY — PX: CORONARY STENT INTERVENTION: CATH118234

## 2017-01-14 ENCOUNTER — Encounter: Payer: Medicare Other | Attending: Internal Medicine | Admitting: *Deleted

## 2017-01-14 VITALS — Ht 73.8 in | Wt 273.9 lb

## 2017-01-14 DIAGNOSIS — K219 Gastro-esophageal reflux disease without esophagitis: Secondary | ICD-10-CM | POA: Diagnosis not present

## 2017-01-14 DIAGNOSIS — Z7902 Long term (current) use of antithrombotics/antiplatelets: Secondary | ICD-10-CM | POA: Diagnosis not present

## 2017-01-14 DIAGNOSIS — Z7982 Long term (current) use of aspirin: Secondary | ICD-10-CM | POA: Insufficient documentation

## 2017-01-14 DIAGNOSIS — I10 Essential (primary) hypertension: Secondary | ICD-10-CM | POA: Insufficient documentation

## 2017-01-14 DIAGNOSIS — I2510 Atherosclerotic heart disease of native coronary artery without angina pectoris: Secondary | ICD-10-CM | POA: Insufficient documentation

## 2017-01-14 DIAGNOSIS — E119 Type 2 diabetes mellitus without complications: Secondary | ICD-10-CM | POA: Diagnosis not present

## 2017-01-14 DIAGNOSIS — G4733 Obstructive sleep apnea (adult) (pediatric): Secondary | ICD-10-CM | POA: Insufficient documentation

## 2017-01-14 DIAGNOSIS — Z79899 Other long term (current) drug therapy: Secondary | ICD-10-CM | POA: Diagnosis not present

## 2017-01-14 DIAGNOSIS — Z9861 Coronary angioplasty status: Secondary | ICD-10-CM | POA: Diagnosis present

## 2017-01-14 NOTE — Progress Notes (Signed)
Cardiac Individual Treatment Plan  Patient Details  Name: [name] MRN: [id number] Date of Birth: September 17, 1945 Referring Provider:     Cardiac Rehab from 01/14/2017 in [facility] Cardiac and Pulmonary Rehab  Referring Provider  Barrie Lyme MD      Initial Encounter Date:    Cardiac Rehab from 01/14/2017 in [facility] Cardiac and Pulmonary Rehab  Date  01/14/17  Referring Provider  Barrie Lyme MD      Visit Diagnosis: S/P PTCA (percutaneous transluminal coronary angioplasty)  Patient's Home Medications on Admission:  Current Outpatient Prescriptions:  .  aspirin 325 MG tablet, Take 325 mg by mouth daily., Disp: , Rfl:  .  clopidogrel (PLAVIX) 75 MG tablet, Take by mouth., Disp: , Rfl:  .  metFORMIN (GLUCOPHAGE) 1000 MG tablet, Take 1,000 mg by mouth 2 (two) times daily with a meal., Disp: , Rfl:  .  olmesartan (BENICAR) 5 MG tablet, Take 5 mg by mouth daily. , Disp: , Rfl:  .  omeprazole (PRILOSEC) 40 MG capsule, Take 40 mg by mouth daily., Disp: , Rfl:  .  sitaGLIPtin (JANUVIA) 100 MG tablet, Take 100 mg by mouth daily., Disp: , Rfl:  .  nebivolol (BYSTOLIC) 10 MG tablet, Take 10 mg by mouth daily. , Disp: , Rfl:  .  triamterene-hydrochlorothiazide (DYAZIDE) 37.5-25 MG capsule, Take 1 capsule by mouth daily., Disp: , Rfl:   Past Medical History: Past Medical History:  Diagnosis Date  . Arthritis   . Coronary artery disease   . Diabetes mellitus without complication ([place])   . GERD (gastroesophageal reflux disease)   . Sleep apnea     Tobacco Use: History  Smoking Status  . Never Smoker  Smokeless Tobacco  . Never Used    Labs: Recent Review Flowsheet Data    There is no flowsheet data to display.       Exercise Target Goals: Date: 01/14/17  Exercise Program Goal: Individual exercise prescription set with THRR, safety & activity barriers. Participant demonstrates ability to understand and report RPE using BORG scale, to self-measure pulse accurately, and to  acknowledge the importance of the exercise prescription.  Exercise Prescription Goal: Starting with aerobic activity 30 plus minutes a day, 3 days per week for initial exercise prescription. Provide home exercise prescription and guidelines that participant acknowledges understanding prior to discharge.  Activity Barriers & Risk Stratification:     Activity Barriers & Cardiac Risk Stratification - 01/14/17 1216      Activity Barriers & Cardiac Risk Stratification   Activity Barriers Deconditioning;Muscular Weakness;Chest Pain/Angina;Arthritis  arthritis in r knee   Cardiac Risk Stratification High      6 Minute Walk:     6 Minute Walk    Row Name 01/14/17 1409         6 Minute Walk   Phase Initial     Distance 1040 feet     Walk Time 6 minutes     # of Rest Breaks 0     MPH 1.97     METS 2.55     RPE 13     VO2 Peak 8.91     Symptoms No     Resting HR 72 bpm     Resting BP 178/100  recheck 164/84     Max Ex. HR 101 bpm     Max Ex. BP 196/86     2 Minute Post BP 172/86        Oxygen Initial Assessment:   Oxygen Re-Evaluation:   Oxygen  Discharge (Final Oxygen Re-Evaluation):   Initial Exercise Prescription:     Initial Exercise Prescription - 01/14/17 1400      Date of Initial Exercise RX and Referring Provider   Date 01/14/17   Referring Provider Barrie Lyme MD     Treadmill   MPH 1.8   Grade 0   Minutes 15   METs 2.38     Recumbant Bike   Level 1   RPM 50   Watts 22   Minutes 15   METs 2.5     NuStep   Level 2   SPM 80   Minutes 15   METs 2     Prescription Details   Frequency (times per week) 3   Duration Progress to 45 minutes of aerobic exercise without signs/symptoms of physical distress     Intensity   THRR 40-80% of Max Heartrate 110-137   Ratings of Perceived Exertion 11-13   Perceived Dyspnea 0-4     Progression   Progression Continue to progress workloads to maintain intensity without signs/symptoms of physical  distress.     Resistance Training   Training Prescription Yes   Weight 3 lbs   Reps 10-15      Perform Capillary Blood Glucose checks as needed.  Exercise Prescription Changes:      Exercise Prescription Changes    Row Name 01/14/17 1400             Response to Exercise   Blood Pressure (Admit) 178/100  recheck 164/84       Blood Pressure (Exercise) 196/86       Blood Pressure (Exit) 172/86       Heart Rate (Admit) 83 bpm       Heart Rate (Exercise) 101 bpm       Heart Rate (Exit) 80 bpm       Oxygen Saturation (Admit) 100 %       Oxygen Saturation (Exercise) 96 %       Rating of Perceived Exertion (Exercise) 13       Symptoms none       Comments walk test results          Exercise Comments:   Exercise Goals and Review:      Exercise Goals    Row Name 01/14/17 1413             Exercise Goals   Increase Physical Activity Yes       Intervention Provide advice, education, support and counseling about physical activity/exercise needs.;Develop an individualized exercise prescription for aerobic and resistive training based on initial evaluation findings, risk stratification, comorbidities and participant's personal goals.       Expected Outcomes Achievement of increased cardiorespiratory fitness and enhanced flexibility, muscular endurance and strength shown through measurements of functional capacity and personal statement of participant.       Increase Strength and Stamina Yes       Intervention Provide advice, education, support and counseling about physical activity/exercise needs.;Develop an individualized exercise prescription for aerobic and resistive training based on initial evaluation findings, risk stratification, comorbidities and participant's personal goals.       Expected Outcomes Achievement of increased cardiorespiratory fitness and enhanced flexibility, muscular endurance and strength shown through measurements of functional capacity and personal  statement of participant.          Exercise Goals Re-Evaluation :   Discharge Exercise Prescription (Final Exercise Prescription Changes):     Exercise Prescription Changes - 01/14/17 1400  Response to Exercise   Blood Pressure (Admit) 178/100  recheck 164/84   Blood Pressure (Exercise) 196/86   Blood Pressure (Exit) 172/86   Heart Rate (Admit) 83 bpm   Heart Rate (Exercise) 101 bpm   Heart Rate (Exit) 80 bpm   Oxygen Saturation (Admit) 100 %   Oxygen Saturation (Exercise) 96 %   Rating of Perceived Exertion (Exercise) 13   Symptoms none   Comments walk test results      Nutrition:  Target Goals: Understanding of nutrition guidelines, daily intake of sodium 1500mg , cholesterol 200mg , calories 30% from fat and 7% or less from saturated fats, daily to have 5 or more servings of fruits and vegetables.  Biometrics:     Pre Biometrics - 01/14/17 1413      Pre Biometrics   Height 6' 1.8" (1.875 m)   Weight 273 lb 14.4 oz (124.2 kg)   Waist Circumference 50 inches   Hip Circumference 47 inches   Waist to Hip Ratio 1.06 %   BMI (Calculated) 35.4   Single Leg Stand 9.59 seconds       Nutrition Therapy Plan and Nutrition Goals:     Nutrition Therapy & Goals - 01/14/17 1216      Nutrition Therapy   RD appointment defered Yes   Drug/Food Interactions Statins/Certain Fruits     Intervention Plan   Intervention Prescribe, educate and counsel regarding individualized specific dietary modifications aiming towards targeted core components such as weight, hypertension, lipid management, diabetes, heart failure and other comorbidities.;Nutrition handout(s) given to patient.   Expected Outcomes Short Term Goal: Understand basic principles of dietary content, such as calories, fat, sodium, cholesterol and nutrients.;Long Term Goal: Adherence to prescribed nutrition plan.      Nutrition Discharge: Rate Your Plate Scores:     Nutrition Assessments - 01/14/17 1100       MEDFICTS Scores   Pre Score 119      Nutrition Goals Re-Evaluation:   Nutrition Goals Discharge (Final Nutrition Goals Re-Evaluation):   Psychosocial: Target Goals: Acknowledge presence or absence of significant depression and/or stress, maximize coping skills, provide positive support system. Participant is able to verbalize types and ability to use techniques and skills needed for reducing stress and depression.   Initial Review & Psychosocial Screening:     Initial Psych Review & Screening - 01/14/17 1055      Initial Review   Current issues with Current Sleep Concerns     Screening Interventions   Interventions Encouraged to exercise      Quality of Life Scores:      Quality of Life - 01/14/17 1051      Quality of Life Scores   Health/Function Pre 24.75 %   Socioeconomic Pre 30 %   Psych/Spiritual Pre 28.57 %   Family Pre 25.5 %   GLOBAL Pre 26.69 %      PHQ-9: Recent Review Flowsheet Data    Depression screen [facility] 2/9 01/14/2017   Decreased Interest 0   Down, Depressed, Hopeless 0   PHQ - 2 Score 0   Altered sleeping 0   Tired, decreased energy 3   Change in appetite 0   Feeling bad or failure about yourself  0   Trouble concentrating 0   Moving slowly or fidgety/restless 0   Suicidal thoughts 0   PHQ-9 Score 3   Difficult doing work/chores Not difficult at all     Interpretation of Total Score  Total Score Depression Severity:  1-4 = Minimal  depression, 5-9 = Mild depression, 10-14 = Moderate depression, 15-19 = Moderately severe depression, 20-27 = Severe depression   Psychosocial Evaluation and Intervention:   Psychosocial Re-Evaluation:   Psychosocial Discharge (Final Psychosocial Re-Evaluation):   Vocational Rehabilitation: Provide vocational rehab assistance to qualifying candidates.   Vocational Rehab Evaluation & Intervention:     Vocational Rehab - 01/14/17 1053      Initial Vocational Rehab Evaluation & Intervention    Assessment shows need for Vocational Rehabilitation No      Education: Education Goals: Education classes will be provided on a weekly basis, covering required topics. Participant will state understanding/return demonstration of topics presented.  Learning Barriers/Preferences:     Learning Barriers/Preferences - 01/14/17 1053      Learning Barriers/Preferences   Learning Barriers None   Learning Preferences Individual Instruction      Education Topics: General Nutrition Guidelines/Fats and Fiber: -Group instruction provided by verbal, written material, models and posters to present the general guidelines for heart healthy nutrition. Gives an explanation and review of dietary fats and fiber.   Controlling Sodium/Reading Food Labels: -Group verbal and written material supporting the discussion of sodium use in heart healthy nutrition. Review and explanation with models, verbal and written materials for utilization of the food label.   Exercise Physiology & Risk Factors: - Group verbal and written instruction with models to review the exercise physiology of the cardiovascular system and associated critical values. Details cardiovascular disease risk factors and the goals associated with each risk factor.   Aerobic Exercise & Resistance Training: - Gives group verbal and written discussion on the health impact of inactivity. On the components of aerobic and resistive training programs and the benefits of this training and how to safely progress through these programs.   Flexibility, Balance, General Exercise Guidelines: - Provides group verbal and written instruction on the benefits of flexibility and balance training programs. Provides general exercise guidelines with specific guidelines to those with heart or lung disease. Demonstration and skill practice provided.   Stress Management: - Provides group verbal and written instruction about the health risks of elevated stress,  cause of high stress, and healthy ways to reduce stress.   Depression: - Provides group verbal and written instruction on the correlation between heart/lung disease and depressed mood, treatment options, and the stigmas associated with seeking treatment.   Anatomy & Physiology of the Heart: - Group verbal and written instruction and models provide basic cardiac anatomy and physiology, with the coronary electrical and arterial systems. Review of: AMI, Angina, Valve disease, Heart Failure, Cardiac Arrhythmia, Pacemakers, and the ICD.   Cardiac Procedures: - Group verbal and written instruction and models to describe the testing methods done to diagnose heart disease. Reviews the outcomes of the test results. Describes the treatment choices: Medical Management, Angioplasty, or Coronary Bypass Surgery.   Cardiac Medications: - Group verbal and written instruction to review commonly prescribed medications for heart disease. Reviews the medication, class of the drug, and side effects. Includes the steps to properly store meds and maintain the prescription regimen.   Go Sex-Intimacy & Heart Disease, Get SMART - Goal Setting: - Group verbal and written instruction through game format to discuss heart disease and the return to sexual intimacy. Provides group verbal and written material to discuss and apply goal setting through the application of the S.M.A.R.T. Method.   Other Matters of the Heart: - Provides group verbal, written materials and models to describe Heart Failure, Angina, Valve Disease, and Diabetes in the  realm of heart disease. Includes description of the disease process and treatment options available to the cardiac patient.   Exercise & Equipment Safety: - Individual verbal instruction and demonstration of equipment use and safety with use of the equipment.   Cardiac Rehab from 01/14/2017 in [facility] Cardiac and Pulmonary Rehab  Date  01/14/17  Educator  C. EnterkinRN  Instruction  Review Code  1- partially meets, needs review/practice      Infection Prevention: - Provides verbal and written material to individual with discussion of infection control including proper hand washing and proper equipment cleaning during exercise session.   Cardiac Rehab from 01/14/2017 in [facility] Cardiac and Pulmonary Rehab  Date  01/14/17  Educator  C.[email or web address]  Instruction Review Code  2- meets goals/outcomes      Falls Prevention: - Provides verbal and written material to individual with discussion of falls prevention and safety.   Cardiac Rehab from 01/14/2017 in [facility] Cardiac and Pulmonary Rehab  Date  01/14/17  Educator  C. Alfarata  Instruction Review Code  2- meets goals/outcomes      Diabetes: - Individual verbal and written instruction to review signs/symptoms of diabetes, desired ranges of glucose level fasting, after meals and with exercise. Advice that pre and post exercise glucose checks will be done for 3 sessions at entry of program.   Cardiac Rehab from 01/14/2017 in [facility] Cardiac and Pulmonary Rehab  Date  01/14/17  Educator  C. Starlin Steib, RN  Instruction Review Code  1- partially meets, needs review/practice       Knowledge Questionnaire Score:     Knowledge Questionnaire Score - 01/14/17 1215      Knowledge Questionnaire Score   Pre Score 1      Core Components/Risk Factors/Patient Goals at Admission:     Personal Goals and Risk Factors at Admission - 01/14/17 1217      Core Components/Risk Factors/Patient Goals on Admission    Weight Management Yes;Obesity   Intervention Weight Management: Develop a combined nutrition and exercise program designed to reach desired caloric intake, while maintaining appropriate intake of nutrient and fiber, sodium and fats, and appropriate energy expenditure required for the weight goal.;Weight Management: Provide education and appropriate resources to help participant work on and attain dietary goals.;Weight  Management/Obesity: Establish reasonable short term and long term weight goals.;Obesity: Provide education and appropriate resources to help participant work on and attain dietary goals.   Admit Weight 273 lb 14.4 oz (124.2 kg)   Goal Weight: Short Term 270 lb (122.5 kg)   Goal Weight: Long Term 260 lb (117.9 kg)   Expected Outcomes Short Term: Continue to assess and modify interventions until short term weight is achieved;Weight Maintenance: Understanding of the daily nutrition guidelines, which includes 25-35% calories from fat, 7% or less cal from saturated fats, less than 200mg  cholesterol, less than 1.5gm of sodium, & 5 or more servings of fruits and vegetables daily;Understanding recommendations for meals to include 15-35% energy as protein, 25-35% energy from fat, 35-60% energy from carbohydrates, less than 200mg  of dietary cholesterol, 20-35 gm of total fiber daily;Long Term: Adherence to nutrition and physical activity/exercise program aimed toward attainment of established weight goal;Weight Loss: Understanding of general recommendations for a balanced deficit meal plan, which promotes 1-2 lb weight loss per week and includes a negative energy balance of [phone] kcal/d;Understanding of distribution of calorie intake throughout the day with the consumption of 4-5 meals/snacks   Diabetes Yes   Intervention Provide education about signs/symptoms and action to take  for hypo/hyperglycemia.;Provide education about proper nutrition, including hydration, and aerobic/resistive exercise prescription along with prescribed medications to achieve blood glucose in normal ranges: Fasting glucose 65-99 mg/dL   Expected Outcomes Short Term: Participant verbalizes understanding of the signs/symptoms and immediate care of hyper/hypoglycemia, proper foot care and importance of medication, aerobic/resistive exercise and nutrition plan for blood glucose control.;Long Term: Attainment of HbA1C < 7%.   Hypertension Yes    Intervention Provide education on lifestyle modifcations including regular physical activity/exercise, weight management, moderate sodium restriction and increased consumption of fresh fruit, vegetables, and low fat dairy, alcohol moderation, and smoking cessation.;Monitor prescription use compliance.   Expected Outcomes Short Term: Continued assessment and intervention until BP is < 140/31mm HG in hypertensive participants. < 130/29mm HG in hypertensive participants with diabetes, heart failure or chronic kidney disease.;Long Term: Maintenance of blood pressure at goal levels.   Lipids Yes   Intervention Provide education and support for participant on nutrition & aerobic/resistive exercise along with prescribed medications to achieve LDL 70mg , HDL >40mg .   Expected Outcomes Short Term: Participant states understanding of desired cholesterol values and is compliant with medications prescribed. Participant is following exercise prescription and nutrition guidelines.;Long Term: Cholesterol controlled with medications as prescribed, with individualized exercise RX and with personalized nutrition plan. Value goals: LDL < 70mg , HDL > 40 mg.      Core Components/Risk Factors/Patient Goals Review:    Core Components/Risk Factors/Patient Goals at Discharge (Final Review):    ITP Comments:     ITP Comments    Row Name 01/14/17 1218           ITP Comments ITP created during Medical review after Cardiac Rehab informed consent was signed by Cindi Carbon" Urda. Documentation of Diagnosis CHL/EPIC          Comments: Ready to start Cardiac rehab and to get his strength back "since my son has been doing about everything for me ie yard work

## 2017-01-14 NOTE — Patient Instructions (Addendum)
Patient Instructions  Patient Details  Name: [name] MRN: [id number] Date of Birth: Oct 06, 1945 Referring Provider:  Yolonda Kida, MD  Below are the personal goals you chose as well as exercise and nutrition goals. Our goal is to help you keep on track towards obtaining and maintaining your goals. We will be discussing your progress on these goals with you throughout the program.  Initial Exercise Prescription:     Initial Exercise Prescription - 01/14/17 1400      Date of Initial Exercise RX and Referring Provider   Date 01/14/17   Referring Provider Barrie Lyme MD     Treadmill   MPH 1.8   Grade 0   Minutes 15   METs 2.38     Recumbant Bike   Level 1   RPM 50   Watts 22   Minutes 15   METs 2.5     NuStep   Level 2   SPM 80   Minutes 15   METs 2     Prescription Details   Frequency (times per week) 3   Duration Progress to 45 minutes of aerobic exercise without signs/symptoms of physical distress     Intensity   THRR 40-80% of Max Heartrate 110-137   Ratings of Perceived Exertion 11-13   Perceived Dyspnea 0-4     Progression   Progression Continue to progress workloads to maintain intensity without signs/symptoms of physical distress.     Resistance Training   Training Prescription Yes   Weight 3 lbs   Reps 10-15      Exercise Goals: Frequency: Be able to perform aerobic exercise three times per week working toward 3-5 days per week.  Intensity: Work with a perceived exertion of 11 (fairly light) - 15 (hard) as tolerated. Follow your new exercise prescription and watch for changes in prescription as you progress with the program. Changes will be reviewed with you when they are made.  Duration: You should be able to do 30 minutes of continuous aerobic exercise in addition to a 5 minute warm-up and a 5 minute cool-down routine.  Nutrition Goals: Your personal nutrition goals will be established when you do your nutrition analysis with the  dietician.  The following are nutrition guidelines to follow: Cholesterol < 200mg /day Sodium < 1500mg /day Fiber: Men over 50 yrs - 30 grams per day  Personal Goals:     Personal Goals and Risk Factors at Admission - 01/14/17 1217      Core Components/Risk Factors/Patient Goals on Admission    Weight Management Yes;Obesity   Intervention Weight Management: Develop a combined nutrition and exercise program designed to reach desired caloric intake, while maintaining appropriate intake of nutrient and fiber, sodium and fats, and appropriate energy expenditure required for the weight goal.;Weight Management: Provide education and appropriate resources to help participant work on and attain dietary goals.;Weight Management/Obesity: Establish reasonable short term and long term weight goals.;Obesity: Provide education and appropriate resources to help participant work on and attain dietary goals.   Admit Weight 273 lb 14.4 oz (124.2 kg)   Goal Weight: Short Term 270 lb (122.5 kg)   Goal Weight: Long Term 260 lb (117.9 kg)   Expected Outcomes Short Term: Continue to assess and modify interventions until short term weight is achieved;Weight Maintenance: Understanding of the daily nutrition guidelines, which includes 25-35% calories from fat, 7% or less cal from saturated fats, less than 200mg  cholesterol, less than 1.5gm of sodium, & 5 or more servings of fruits and  vegetables daily;Understanding recommendations for meals to include 15-35% energy as protein, 25-35% energy from fat, 35-60% energy from carbohydrates, less than 200mg  of dietary cholesterol, 20-35 gm of total fiber daily;Long Term: Adherence to nutrition and physical activity/exercise program aimed toward attainment of established weight goal;Weight Loss: Understanding of general recommendations for a balanced deficit meal plan, which promotes 1-2 lb weight loss per week and includes a negative energy balance of [id number] kcal/d;Understanding of  distribution of calorie intake throughout the day with the consumption of 4-5 meals/snacks   Diabetes Yes   Intervention Provide education about signs/symptoms and action to take for hypo/hyperglycemia.;Provide education about proper nutrition, including hydration, and aerobic/resistive exercise prescription along with prescribed medications to achieve blood glucose in normal ranges: Fasting glucose 65-99 mg/dL   Expected Outcomes Short Term: Participant verbalizes understanding of the signs/symptoms and immediate care of hyper/hypoglycemia, proper foot care and importance of medication, aerobic/resistive exercise and nutrition plan for blood glucose control.;Long Term: Attainment of HbA1C < 7%.   Hypertension Yes   Intervention Provide education on lifestyle modifcations including regular physical activity/exercise, weight management, moderate sodium restriction and increased consumption of fresh fruit, vegetables, and low fat dairy, alcohol moderation, and smoking cessation.;Monitor prescription use compliance.   Expected Outcomes Short Term: Continued assessment and intervention until BP is < 140/95mm HG in hypertensive participants. < 130/54mm HG in hypertensive participants with diabetes, heart failure or chronic kidney disease.;Long Term: Maintenance of blood pressure at goal levels.   Lipids Yes   Intervention Provide education and support for participant on nutrition & aerobic/resistive exercise along with prescribed medications to achieve LDL 70mg , HDL >40mg .   Expected Outcomes Short Term: Participant states understanding of desired cholesterol values and is compliant with medications prescribed. Participant is following exercise prescription and nutrition guidelines.;Long Term: Cholesterol controlled with medications as prescribed, with individualized exercise RX and with personalized nutrition plan. Value goals: LDL < 70mg , HDL > 40 mg.      Tobacco Use Initial Evaluation: History   Smoking Status  . Never Smoker  Smokeless Tobacco  . Never Used    Copy of goals given to participant.

## 2017-01-14 NOTE — Progress Notes (Signed)
Daily Session Note  Patient Details  Name: [name] MRN: [id number] Date of Birth: [birth year] Referring Provider:    Encounter Date: 01/14/2017  Check In:     Session Check In - 01/14/17 1216      Check-In   Warm-up and Cool-down Performed as group-led instruction   Resistance Training Performed Yes   VAD Patient? No     Pain Assessment   Currently in Pain? No/denies         History  Smoking Status  . Never Smoker  Smokeless Tobacco  . Never Used    Goals Met:  Proper associated with RPD/PD & O2 Sat Exercise tolerated well  Goals Unmet:  Not Applicable  Comments:     Dr. Emily Filbert is Medical Director for [place] and [name].

## 2017-01-16 DIAGNOSIS — Z9861 Coronary angioplasty status: Secondary | ICD-10-CM | POA: Diagnosis not present

## 2017-01-16 DIAGNOSIS — Z951 Presence of aortocoronary bypass graft: Secondary | ICD-10-CM

## 2017-01-16 LAB — GLUCOSE, CAPILLARY
GLUCOSE-CAPILLARY: 174 mg/dL — AB (ref 65–99)
Glucose-Capillary: 173 mg/dL — ABNORMAL HIGH (ref 65–99)

## 2017-01-16 NOTE — Progress Notes (Signed)
Daily Session Note  Patient Details  Name: [name] MRN: [id number] Date of Birth: 1946/01/07 Referring Provider:     Cardiac Rehab from 01/14/2017 in [facility] Cardiac and Pulmonary Rehab  Referring Provider  Barrie Lyme MD      Encounter Date: 01/16/2017  Check In:     Session Check In - 01/16/17 0918      Check-In   Location [facility]-Cardiac & Pulmonary Rehab   Staff Present Heath Lark, RN, BSN, CCRP;Jessica Luan Pulling, MA, ACSM RCEP, Exercise Physiologist;Amanda Oletta Darter, BA, ACSM CEP, Exercise Physiologist   Supervising physician immediately available to respond to emergencies See telemetry face sheet for immediately available ER MD   Medication changes reported     No   Fall or balance concerns reported    No   Warm-up and Cool-down Performed on first and last piece of equipment   Resistance Training Performed Yes   VAD Patient? No     Pain Assessment   Currently in Pain? No/denies         History  Smoking Status  . Never Smoker  Smokeless Tobacco  . Never Used    Goals Met:  Independence with exercise equipment Exercise tolerated well No report of cardiac concerns or symptoms Strength training completed today  Goals Unmet:  Not Applicable  Comments: First full day of exercise!  Patient was oriented to gym and equipment including functions, settings, policies, and procedures.  Patient's individual exercise prescription and treatment plan were reviewed.  All starting workloads were established based on the results of the 6 minute walk test done at initial orientation visit.  The plan for exercise progression was also introduced and progression will be customized based on patient's performance and goals.    Dr. Emily Filbert is Medical Director for [place] and [name].

## 2017-01-18 DIAGNOSIS — Z951 Presence of aortocoronary bypass graft: Secondary | ICD-10-CM

## 2017-01-18 DIAGNOSIS — Z9861 Coronary angioplasty status: Secondary | ICD-10-CM | POA: Diagnosis not present

## 2017-01-18 LAB — GLUCOSE, CAPILLARY
GLUCOSE-CAPILLARY: 170 mg/dL — AB (ref 65–99)
GLUCOSE-CAPILLARY: 159 mg/dL — AB (ref 65–99)

## 2017-01-18 NOTE — Progress Notes (Signed)
Daily Session Note  Patient Details  Name: [name] MRN: [id number] Date of Birth: [birth year] Referring Provider:     Cardiac Rehab from 01/14/2017 in [facility] Cardiac and Pulmonary Rehab  Referring Provider  Barrie Lyme MD      Encounter Date: 01/18/2017  Check In:     Session Check In - 01/18/17 0810      Check-In   Location [facility]-Cardiac & Pulmonary Rehab   Staff Present Gerlene Burdock, RN, BSN;Jessica Luan Pulling, MA, ACSM RCEP, Exercise Physiologist;Tailor Lucking Oletta Darter, BA, ACSM CEP, Exercise Physiologist   Supervising physician immediately available to respond to emergencies See telemetry face sheet for immediately available ER MD   Medication changes reported     No   Fall or balance concerns reported    No   Warm-up and Cool-down Performed on first and last piece of equipment   Resistance Training Performed Yes   VAD Patient? No     Pain Assessment   Currently in Pain? No/denies         History  Smoking Status  . Never Smoker  Smokeless Tobacco  . Never Used    Goals Met:  Independence with exercise equipment Exercise tolerated well No report of cardiac concerns or symptoms Strength training completed today  Goals Unmet:  Not Applicable  Comments: Pt able to follow exercise prescription today without complaint.  Will continue to monitor for progression.    Dr. Emily Filbert is Medical Director for [name] and [name].

## 2017-01-21 ENCOUNTER — Encounter: Payer: Medicare Other | Admitting: *Deleted

## 2017-01-21 DIAGNOSIS — Z951 Presence of aortocoronary bypass graft: Secondary | ICD-10-CM

## 2017-01-21 DIAGNOSIS — Z9861 Coronary angioplasty status: Secondary | ICD-10-CM

## 2017-01-21 NOTE — Progress Notes (Signed)
Daily Session Note  Patient Details  Name: MAXAMILLIAN TIENDA MRN: [id number] Date of Birth: 01-Sep-1945 Referring Provider:     Cardiac Rehab from 01/14/2017 in [facility] Cardiac and Pulmonary Rehab  Referring Provider  Barrie Lyme MD      Encounter Date: 01/21/2017  Check In:     Session Check In - 01/21/17 0752      Check-In   Location [facility]-Cardiac & Pulmonary Rehab   Staff Present Earlean Shawl, BS, ACSM CEP, Exercise Physiologist;Carroll Enterkin, RN, Levie Heritage, MA, ACSM RCEP, Exercise Physiologist   Supervising physician immediately available to respond to emergencies See telemetry face sheet for immediately available ER MD   Medication changes reported     No   Fall or balance concerns reported    No   Warm-up and Cool-down Performed on first and last piece of equipment   Resistance Training Performed Yes   VAD Patient? No     Pain Assessment   Currently in Pain? No/denies   Multiple Pain Sites No         History  Smoking Status  . Never Smoker  Smokeless Tobacco  . Never Used    Goals Met:  Independence with exercise equipment Exercise tolerated well No report of cardiac concerns or symptoms Strength training completed today  Goals Unmet:  Not Applicable  Comments: Pt able to follow exercise prescription today without complaint.  Will continue to monitor for progression.    Dr. Emily Filbert is Medical Director for [place] and [name].

## 2017-01-25 ENCOUNTER — Encounter: Payer: Medicare Other | Admitting: *Deleted

## 2017-01-25 VITALS — BP 148/80 | Wt 271.6 lb

## 2017-01-25 DIAGNOSIS — Z9861 Coronary angioplasty status: Secondary | ICD-10-CM | POA: Diagnosis not present

## 2017-01-25 NOTE — Progress Notes (Signed)
Daily Session Note  Patient Details  Name: AHMAUD DUTHIE MRN: [id number] Date of Birth: 18-Apr-1946 Referring Provider:     Cardiac Rehab from 01/14/2017 in [facility] Cardiac and Pulmonary Rehab  Referring Provider  Barrie Lyme MD      Encounter Date: 01/25/2017  Check In:     Session Check In - 01/25/17 0819      Check-In   Location [facility]-Cardiac & Pulmonary Rehab   Staff Present Gerlene Burdock, RN, Vickki Hearing, BA, ACSM CEP, Exercise Physiologist;Joseph Hood RCP,RRT,BSRT;Diane Joya Gaskins RN,BSN;Susanne Bice, RN, BSN, [employer] physician immediately available to respond to emergencies See telemetry face sheet for immediately available ER MD   Medication changes reported     No   Fall or balance concerns reported    No   Tobacco Cessation No Change   Warm-up and Cool-down Performed on first and last piece of equipment   Resistance Training Performed Yes   VAD Patient? No     Pain Assessment   Currently in Pain? No/denies         History  Smoking Status  . Never Smoker  Smokeless Tobacco  . Never Used    Goals Met:  Proper associated with RPD/PD & O2 Sat Exercise tolerated well No report of cardiac concerns or symptoms Strength training completed today  Goals Unmet:  Not Applicable  Comments:     Dr. Emily Filbert is Medical Director for [place] and [name].

## 2017-01-28 ENCOUNTER — Encounter: Payer: Medicare Other | Admitting: *Deleted

## 2017-01-28 VITALS — BP 124/60 | Wt 274.0 lb

## 2017-01-28 DIAGNOSIS — Z951 Presence of aortocoronary bypass graft: Secondary | ICD-10-CM

## 2017-01-28 DIAGNOSIS — Z9861 Coronary angioplasty status: Secondary | ICD-10-CM

## 2017-01-28 NOTE — Progress Notes (Signed)
Daily Session Note  Patient Details  Name: [name] MRN: [id number] Date of Birth: 19-Apr-1946 Referring Provider:     Cardiac Rehab from 01/14/2017 in [facility] Cardiac and Pulmonary Rehab  Referring Provider  Barrie Lyme MD      Encounter Date: 01/28/2017  Check In:     Session Check In - 01/28/17 0753      Check-In   Location [facility]-Cardiac & Pulmonary Rehab   Staff Present Earlean Shawl, BS, ACSM CEP, Exercise Physiologist;Jessica Luan Pulling, MA, ACSM RCEP, Exercise Physiologist;Carroll Enterkin, RN, BSN   Supervising physician immediately available to respond to emergencies See telemetry face sheet for immediately available ER MD   Medication changes reported     No   Fall or balance concerns reported    No   Warm-up and Cool-down Performed on first and last piece of equipment   Resistance Training Performed Yes   VAD Patient? No     Pain Assessment   Currently in Pain? No/denies   Multiple Pain Sites No         History  Smoking Status  . Never Smoker  Smokeless Tobacco  . Never Used    Goals Met:  Independence with exercise equipment Exercise tolerated well No report of cardiac concerns or symptoms Strength training completed today  Goals Unmet:  Not Applicable  Comments: Pt able to follow exercise prescription today without complaint.  Will continue to monitor for progression.    Dr. Emily Filbert is Medical Director for [place] and [name].

## 2017-01-30 ENCOUNTER — Encounter: Payer: Self-pay | Admitting: *Deleted

## 2017-01-30 ENCOUNTER — Encounter: Payer: Medicare Other | Admitting: *Deleted

## 2017-01-30 DIAGNOSIS — Z9861 Coronary angioplasty status: Secondary | ICD-10-CM | POA: Diagnosis not present

## 2017-01-30 DIAGNOSIS — Z951 Presence of aortocoronary bypass graft: Secondary | ICD-10-CM

## 2017-01-30 NOTE — Progress Notes (Signed)
Cardiac Individual Treatment Plan  Patient Details  Name: [name] MRN: [id number] Date of Birth: [birth year] Referring Provider:     Cardiac Rehab from 01/14/2017 in [facility] Cardiac and Pulmonary Rehab  Referring Provider  Barrie Lyme MD      Initial Encounter Date:    Cardiac Rehab from 01/14/2017 in [facility] Cardiac and Pulmonary Rehab  Date  01/14/17  Referring Provider  Barrie Lyme MD      Visit Diagnosis: S/P PTCA (percutaneous transluminal coronary angioplasty)  S/P CABG x 3  Patient's Home Medications on Admission:  Current Outpatient Prescriptions:  .  aspirin 325 MG tablet, Take 325 mg by mouth daily., Disp: , Rfl:  .  clopidogrel (PLAVIX) 75 MG tablet, Take by mouth., Disp: , Rfl:  .  metFORMIN (GLUCOPHAGE) 1000 MG tablet, Take 1,000 mg by mouth 2 (two) times daily with a meal., Disp: , Rfl:  .  nebivolol (BYSTOLIC) 10 MG tablet, Take 10 mg by mouth daily. , Disp: , Rfl:  .  olmesartan (BENICAR) 5 MG tablet, Take 5 mg by mouth daily. , Disp: , Rfl:  .  omeprazole (PRILOSEC) 40 MG capsule, Take 40 mg by mouth daily., Disp: , Rfl:  .  sitaGLIPtin (JANUVIA) 100 MG tablet, Take 100 mg by mouth daily., Disp: , Rfl:  .  triamterene-hydrochlorothiazide (DYAZIDE) 37.5-25 MG capsule, Take 1 capsule by mouth daily., Disp: , Rfl:   Past Medical History: Past Medical History:  Diagnosis Date  . Arthritis   . Coronary artery disease   . Diabetes mellitus without complication ([place])   . GERD (gastroesophageal reflux disease)   . Sleep apnea     Tobacco Use: History  Smoking Status  . Never Smoker  Smokeless Tobacco  . Never Used    Labs: Recent Review Flowsheet Data    There is no flowsheet data to display.       Exercise Target Goals:    Exercise Program Goal: Individual exercise prescription set with THRR, safety & activity barriers. Participant demonstrates ability to understand and report RPE using BORG scale, to self-measure pulse accurately, and to  acknowledge the importance of the exercise prescription.  Exercise Prescription Goal: Starting with aerobic activity 30 plus minutes a day, 3 days per week for initial exercise prescription. Provide home exercise prescription and guidelines that participant acknowledges understanding prior to discharge.  Activity Barriers & Risk Stratification:     Activity Barriers & Cardiac Risk Stratification - 01/14/17 1216      Activity Barriers & Cardiac Risk Stratification   Activity Barriers Deconditioning;Muscular Weakness;Chest Pain/Angina;Arthritis  arthritis in r knee   Cardiac Risk Stratification High      6 Minute Walk:     6 Minute Walk    Row Name 01/14/17 1409         6 Minute Walk   Phase Initial     Distance 1040 feet     Walk Time 6 minutes     # of Rest Breaks 0     MPH 1.97     METS 2.55     RPE 13     VO2 Peak 8.91     Symptoms No     Resting HR 72 bpm     Resting BP 178/100  recheck 164/84     Max Ex. HR 101 bpm     Max Ex. BP 196/86     2 Minute Post BP 172/86        Oxygen Initial Assessment:  Oxygen Re-Evaluation:   Oxygen Discharge (Final Oxygen Re-Evaluation):   Initial Exercise Prescription:     Initial Exercise Prescription - 01/14/17 1400      Date of Initial Exercise RX and Referring Provider   Date 01/14/17   Referring Provider Barrie Lyme MD     Treadmill   MPH 1.8   Grade 0   Minutes 15   METs 2.38     Recumbant Bike   Level 1   RPM 50   Watts 22   Minutes 15   METs 2.5     NuStep   Level 2   SPM 80   Minutes 15   METs 2     Prescription Details   Frequency (times per week) 3   Duration Progress to 45 minutes of aerobic exercise without signs/symptoms of physical distress     Intensity   THRR 40-80% of Max Heartrate 110-137   Ratings of Perceived Exertion 11-13   Perceived Dyspnea 0-4     Progression   Progression Continue to progress workloads to maintain intensity without signs/symptoms of physical  distress.     Resistance Training   Training Prescription Yes   Weight 3 lbs   Reps 10-15      Perform Capillary Blood Glucose checks as needed.  Exercise Prescription Changes:     Exercise Prescription Changes    Row Name 01/14/17 1400 01/23/17 1400           Response to Exercise   Blood Pressure (Admit) 178/100  recheck 164/84 126/64      Blood Pressure (Exercise) 196/86 168/84      Blood Pressure (Exit) 172/86 136/74      Heart Rate (Admit) 83 bpm 75 bpm      Heart Rate (Exercise) 101 bpm 117 bpm      Heart Rate (Exit) 80 bpm 79 bpm      Oxygen Saturation (Admit) 100 %  -      Oxygen Saturation (Exercise) 96 %  -      Rating of Perceived Exertion (Exercise) 13 13      Symptoms none none      Comments walk test results  -      Duration  - Progress to 45 minutes of aerobic exercise without signs/symptoms of physical distress      Intensity  - THRR unchanged        Progression   Progression  - Continue to progress workloads to maintain intensity without signs/symptoms of physical distress.      Average METs  - 2.45        Resistance Training   Training Prescription  - Yes      Weight  - 3 lbs      Reps  - 10-15        Interval Training   Interval Training  - No        Treadmill   MPH  - 1.8      Grade  - 0      Minutes  - 15      METs  - 2.38        Recumbant Bike   Level  - 4      Watts  - 26      Minutes  - 15      METs  - 2.66        NuStep   Level  - 5      Minutes  - 15  METs  - 1.9         Exercise Comments:     Exercise Comments    Row Name 01/16/17 0919           Exercise Comments First full day of exercise!  Patient was oriented to gym and equipment including functions, settings, policies, and procedures.  Patient's individual exercise prescription and treatment plan were reviewed.  All starting workloads were established based on the results of the 6 minute walk test done at initial orientation visit.  The plan for exercise  progression was also introduced and progression will be customized based on patient's performance and goals.          Exercise Goals and Review:     Exercise Goals    Row Name 01/14/17 1413             Exercise Goals   Increase Physical Activity Yes       Intervention Provide advice, education, support and counseling about physical activity/exercise needs.;Develop an individualized exercise prescription for aerobic and resistive training based on initial evaluation findings, risk stratification, comorbidities and participant's personal goals.       Expected Outcomes Achievement of increased cardiorespiratory fitness and enhanced flexibility, muscular endurance and strength shown through measurements of functional capacity and personal statement of participant.       Increase Strength and Stamina Yes       Intervention Provide advice, education, support and counseling about physical activity/exercise needs.;Develop an individualized exercise prescription for aerobic and resistive training based on initial evaluation findings, risk stratification, comorbidities and participant's personal goals.       Expected Outcomes Achievement of increased cardiorespiratory fitness and enhanced flexibility, muscular endurance and strength shown through measurements of functional capacity and personal statement of participant.          Exercise Goals Re-Evaluation :     Exercise Goals Re-Evaluation    Row Name 01/23/17 1432             Exercise Goal Re-Evaluation   Exercise Goals Review Increase Strenth and Stamina;Increase Physical Activity       Comments [name] is off to a good start in rehab.  He has completed three exercise sessions with [place] so far.  He is already up to level 5 on the NuStep.  We will continue to monitor his progress.       Expected Outcomes Short: Review home exercise.   Long: Work on Education administrator.          Discharge Exercise Prescription (Final Exercise  Prescription Changes):     Exercise Prescription Changes - 01/23/17 1400      Response to Exercise   Blood Pressure (Admit) 126/64   Blood Pressure (Exercise) 168/84   Blood Pressure (Exit) 136/74   Heart Rate (Admit) 75 bpm   Heart Rate (Exercise) 117 bpm   Heart Rate (Exit) 79 bpm   Rating of Perceived Exertion (Exercise) 13   Symptoms none   Duration Progress to 45 minutes of aerobic exercise without signs/symptoms of physical distress   Intensity THRR unchanged     Progression   Progression Continue to progress workloads to maintain intensity without signs/symptoms of physical distress.   Average METs 2.45     Resistance Training   Training Prescription Yes   Weight 3 lbs   Reps 10-15     Interval Training   Interval Training No     Treadmill   MPH 1.8   Grade 0  Minutes 15   METs 2.38     Recumbant Bike   Level 4   Watts 26   Minutes 15   METs 2.66     NuStep   Level 5   Minutes 15   METs 1.9      Nutrition:  Target Goals: Understanding of nutrition guidelines, daily intake of sodium <1545m, cholesterol <2042m calories 30% from fat and 7% or less from saturated fats, daily to have 5 or more servings of fruits and vegetables.  Biometrics:     Pre Biometrics - 01/14/17 1413      Pre Biometrics   Height 6' 1.8" (1.875 m)   Weight 273 lb 14.4 oz (124.2 kg)   Waist Circumference 50 inches   Hip Circumference 47 inches   Waist to Hip Ratio 1.06 %   BMI (Calculated) 35.4   Single Leg Stand 9.59 seconds       Nutrition Therapy Plan and Nutrition Goals:     Nutrition Therapy & Goals - 01/14/17 1216      Nutrition Therapy   RD appointment defered Yes   Drug/Food Interactions Statins/Certain Fruits     Intervention Plan   Intervention Prescribe, educate and counsel regarding individualized specific dietary modifications aiming towards targeted core components such as weight, hypertension, lipid management, diabetes, heart failure and other  comorbidities.;Nutrition handout(s) given to patient.   Expected Outcomes Short Term Goal: Understand basic principles of dietary content, such as calories, fat, sodium, cholesterol and nutrients.;Long Term Goal: Adherence to prescribed nutrition plan.      Nutrition Discharge: Rate Your Plate Scores:     Nutrition Assessments - 01/14/17 1100      MEDFICTS Scores   Pre Score 119      Nutrition Goals Re-Evaluation:   Nutrition Goals Discharge (Final Nutrition Goals Re-Evaluation):   Psychosocial: Target Goals: Acknowledge presence or absence of significant depression and/or stress, maximize coping skills, provide positive support system. Participant is able to verbalize types and ability to use techniques and skills needed for reducing stress and depression.   Initial Review & Psychosocial Screening:     Initial Psych Review & Screening - 01/14/17 1055      Initial Review   Current issues with Current Sleep Concerns     Screening Interventions   Interventions Encouraged to exercise      Quality of Life Scores:      Quality of Life - 01/14/17 1051      Quality of Life Scores   Health/Function Pre 24.75 %   Socioeconomic Pre 30 %   Psych/Spiritual Pre 28.57 %   Family Pre 25.5 %   GLOBAL Pre 26.69 %      PHQ-9: Recent Review Flowsheet Data    [facility] 01/14/2017   Decreased Interest 0   Down, Depressed, Hopeless 0   PHQ - 2 Score 0   Altered sleeping 0   Tired, decreased energy 3   Change in appetite 0   Feeling bad or failure about yourself  0   Trouble concentrating 0   Moving slowly or fidgety/restless 0   Suicidal thoughts 0   PHQ-9 Score 3   Difficult doing work/chores Not difficult at all     Interpretation of Total Score  Total Score Depression Severity:  1-4 = Minimal depression, 5-9 = Mild depression, 10-14 = Moderate depression, 15-19 = Moderately severe depression, 20-27 = Severe depression   Psychosocial Evaluation and  Intervention:     Psychosocial Evaluation -  01/16/17 0940      Psychosocial Evaluation & Interventions   Interventions Encouraged to exercise with the program and follow exercise prescription;Stress management education   Comments Counselor met with Mr. Molner [facility]) for initial psychosocial evaluation.  He is a 71 year old who had multiple blockages resulting in a triple bypass 6 months ago - which was unsuccessful.  This resulted in subsequent procedures in April and May with 4 stents inserted, so this has been quite a journey for him.  [name] has diabetes; high blood pressure and sleep apnea in addition to his cardiac issues.  He has a strong support system with a spouse of 5 years and a son and his family (including 74 yr old twins) live locally as well.  [name] reports sleeping in his recliner and typically gets approximately 6 hrs per night.  His appetite is good and he denies a history of depression or anxiety or current symptoms.  He states he is typically in a positive mood and has minimal stress; other than his own health and that of his wife.  [name] has a very positive attitude; and appears to cope well with life - taking one day at a time.  He has goals for this program to increase his stamina and strength.  Staff will be following with [name] throughout the course of this program.     Expected Outcomes [name] will exercise consistently to achieve his stated goals.  He will benefit from the psychoeducational components of this program to help with stress in his life.  He will need to meet with the exercise physiologists to create a plan for consistent exercise upon completion of this program.     Continue Psychosocial Services  Follow up required by staff      Psychosocial Re-Evaluation:   Psychosocial Discharge (Final Psychosocial Re-Evaluation):   Vocational Rehabilitation: Provide vocational rehab assistance to qualifying candidates.   Vocational Rehab Evaluation & Intervention:      Vocational Rehab - 01/14/17 1053      Initial Vocational Rehab Evaluation & Intervention   Assessment shows need for Vocational Rehabilitation No      Education: Education Goals: Education classes will be provided on a weekly basis, covering required topics. Participant will state understanding/return demonstration of topics presented.  Learning Barriers/Preferences:     Learning Barriers/Preferences - 01/14/17 1053      Learning Barriers/Preferences   Learning Barriers None   Learning Preferences Individual Instruction      Education Topics: General Nutrition Guidelines/Fats and Fiber: -Group instruction provided by verbal, written material, models and posters to present the general guidelines for heart healthy nutrition. Gives an explanation and review of dietary fats and fiber.   Controlling Sodium/Reading Food Labels: -Group verbal and written material supporting the discussion of sodium use in heart healthy nutrition. Review and explanation with models, verbal and written materials for utilization of the food label.   Exercise Physiology & Risk Factors: - Group verbal and written instruction with models to review the exercise physiology of the cardiovascular system and associated critical values. Details cardiovascular disease risk factors and the goals associated with each risk factor.   Aerobic Exercise & Resistance Training: - Gives group verbal and written discussion on the health impact of inactivity. On the components of aerobic and resistive training programs and the benefits of this training and how to safely progress through these programs.   Flexibility, Balance, General Exercise Guidelines: - Provides group verbal and written instruction on the benefits of flexibility  and balance training programs. Provides general exercise guidelines with specific guidelines to those with heart or lung disease. Demonstration and skill practice provided.   Stress  Management: - Provides group verbal and written instruction about the health risks of elevated stress, cause of high stress, and healthy ways to reduce stress.   Depression: - Provides group verbal and written instruction on the correlation between heart/lung disease and depressed mood, treatment options, and the stigmas associated with seeking treatment.   Anatomy & Physiology of the Heart: - Group verbal and written instruction and models provide basic cardiac anatomy and physiology, with the coronary electrical and arterial systems. Review of: AMI, Angina, Valve disease, Heart Failure, Cardiac Arrhythmia, Pacemakers, and the ICD.   Cardiac Procedures: - Group verbal and written instruction and models to describe the testing methods done to diagnose heart disease. Reviews the outcomes of the test results. Describes the treatment choices: Medical Management, Angioplasty, or Coronary Bypass Surgery.   Cardiac Medications: - Group verbal and written instruction to review commonly prescribed medications for heart disease. Reviews the medication, class of the drug, and side effects. Includes the steps to properly store meds and maintain the prescription regimen.   Cardiac Rehab from 01/28/2017 in [facility] Cardiac and Pulmonary Rehab  Date  01/21/17  Educator  CE  Instruction Review Code  2- meets goals/outcomes      Go Sex-Intimacy & Heart Disease, Get SMART - Goal Setting: - Group verbal and written instruction through game format to discuss heart disease and the return to sexual intimacy. Provides group verbal and written material to discuss and apply goal setting through the application of the S.M.A.R.T. Method.   Other Matters of the Heart: - Provides group verbal, written materials and models to describe Heart Failure, Angina, Valve Disease, and Diabetes in the realm of heart disease. Includes description of the disease process and treatment options available to the cardiac  patient.   Exercise & Equipment Safety: - Individual verbal instruction and demonstration of equipment use and safety with use of the equipment.   Cardiac Rehab from 01/28/2017 in [facility] Cardiac and Pulmonary Rehab  Date  01/14/17  Educator  C. EnterkinRN  Instruction Review Code  1- partially meets, needs review/practice      Infection Prevention: - Provides verbal and written material to individual with discussion of infection control including proper hand washing and proper equipment cleaning during exercise session.   Cardiac Rehab from 01/28/2017 in [facility] Cardiac and Pulmonary Rehab  Date  01/14/17  Educator  C.EnterkinRN  Instruction Review Code  2- meets goals/outcomes      Falls Prevention: - Provides verbal and written material to individual with discussion of falls prevention and safety.   Cardiac Rehab from 01/28/2017 in [facility] Cardiac and Pulmonary Rehab  Date  01/14/17  Educator  C. Largo  Instruction Review Code  2- meets goals/outcomes      Diabetes: - Individual verbal and written instruction to review signs/symptoms of diabetes, desired ranges of glucose level fasting, after meals and with exercise. Advice that pre and post exercise glucose checks will be done for 3 sessions at entry of program.   Cardiac Rehab from 01/28/2017 in [facility] Cardiac and Pulmonary Rehab  Date  01/14/17  Educator  C. Enterkin, RN  Instruction Review Code  1- partially meets, needs review/practice       Knowledge Questionnaire Score:     Knowledge Questionnaire Score - 01/14/17 1215      Knowledge Questionnaire Score   Pre  Score 1      Core Components/Risk Factors/Patient Goals at Admission:     Personal Goals and Risk Factors at Admission - 01/14/17 1217      Core Components/Risk Factors/Patient Goals on Admission    Weight Management Yes;Obesity   Intervention Weight Management: Develop a combined nutrition and exercise program designed to reach desired caloric intake,  while maintaining appropriate intake of nutrient and fiber, sodium and fats, and appropriate energy expenditure required for the weight goal.;Weight Management: Provide education and appropriate resources to help participant work on and attain dietary goals.;Weight Management/Obesity: Establish reasonable short term and long term weight goals.;Obesity: Provide education and appropriate resources to help participant work on and attain dietary goals.   Admit Weight 273 lb 14.4 oz (124.2 kg)   Goal Weight: Short Term 270 lb (122.5 kg)   Goal Weight: Long Term 260 lb (117.9 kg)   Expected Outcomes Short Term: Continue to assess and modify interventions until short term weight is achieved;Weight Maintenance: Understanding of the daily nutrition guidelines, which includes 25-35% calories from fat, 7% or less cal from saturated fats, less than 264m cholesterol, less than 1.5gm of sodium, & 5 or more servings of fruits and vegetables daily;Understanding recommendations for meals to include 15-35% energy as protein, 25-35% energy from fat, 35-60% energy from carbohydrates, less than 203mof dietary cholesterol, 20-35 gm of total fiber daily;Long Term: Adherence to nutrition and physical activity/exercise program aimed toward attainment of established weight goal;Weight Loss: Understanding of general recommendations for a balanced deficit meal plan, which promotes 1-2 lb weight loss per week and includes a negative energy balance of [phone] kcal/d;Understanding of distribution of calorie intake throughout the day with the consumption of 4-5 meals/snacks   Diabetes Yes   Intervention Provide education about signs/symptoms and action to take for hypo/hyperglycemia.;Provide education about proper nutrition, including hydration, and aerobic/resistive exercise prescription along with prescribed medications to achieve blood glucose in normal ranges: Fasting glucose 65-99 mg/dL   Expected Outcomes Short Term: Participant  verbalizes understanding of the signs/symptoms and immediate care of hyper/hypoglycemia, proper foot care and importance of medication, aerobic/resistive exercise and nutrition plan for blood glucose control.;Long Term: Attainment of HbA1C < 7%.   Hypertension Yes   Intervention Provide education on lifestyle modifcations including regular physical activity/exercise, weight management, moderate sodium restriction and increased consumption of fresh fruit, vegetables, and low fat dairy, alcohol moderation, and smoking cessation.;Monitor prescription use compliance.   Expected Outcomes Short Term: Continued assessment and intervention until BP is < 140/9027mG in hypertensive participants. < 130/28m28m in hypertensive participants with diabetes, heart failure or chronic kidney disease.;Long Term: Maintenance of blood pressure at goal levels.   Lipids Yes   Intervention Provide education and support for participant on nutrition & aerobic/resistive exercise along with prescribed medications to achieve LDL <70mg50mL >40mg.75mxpected Outcomes Short Term: Participant states understanding of desired cholesterol values and is compliant with medications prescribed. Participant is following exercise prescription and nutrition guidelines.;Long Term: Cholesterol controlled with medications as prescribed, with individualized exercise RX and with personalized nutrition plan. Value goals: LDL < 70mg, 71m> 40 mg.      Core Components/Risk Factors/Patient Goals Review:    Core Components/Risk Factors/Patient Goals at Discharge (Final Review):    ITP Comments:     ITP Comments    Row Name 01/14/17 1218 01/30/17 0632 [id number]       ITP Comments ITP created during Medical review after Cardiac Rehab informed consent was  signed by Cindi Carbon" Egolf. Documentation of Diagnosis CHL/EPIC 30 day review. Continue with ITP unless directed changes per Medical Director review   New to program 30 day review.  Continue with ITP unless directed changes per Medical Director review   New to program        Comments:

## 2017-01-30 NOTE — Progress Notes (Signed)
Cardiac Individual Treatment Plan  Patient Details  Name: [name] MRN: [id number] Date of Birth: [birth year] Referring Provider:     Cardiac Rehab from 01/14/2017 in [facility] Cardiac and Pulmonary Rehab  Referring Provider  Barrie Lyme MD      Initial Encounter Date:    Cardiac Rehab from 01/14/2017 in [facility] Cardiac and Pulmonary Rehab  Date  01/14/17  Referring Provider  Barrie Lyme MD      Visit Diagnosis: S/P PTCA (percutaneous transluminal coronary angioplasty)  Patient's Home Medications on Admission:  Current Outpatient Prescriptions:  .  aspirin 325 MG tablet, Take 325 mg by mouth daily., Disp: , Rfl:  .  clopidogrel (PLAVIX) 75 MG tablet, Take by mouth., Disp: , Rfl:  .  metFORMIN (GLUCOPHAGE) 1000 MG tablet, Take 1,000 mg by mouth 2 (two) times daily with a meal., Disp: , Rfl:  .  nebivolol (BYSTOLIC) 10 MG tablet, Take 10 mg by mouth daily. , Disp: , Rfl:  .  olmesartan (BENICAR) 5 MG tablet, Take 5 mg by mouth daily. , Disp: , Rfl:  .  omeprazole (PRILOSEC) 40 MG capsule, Take 40 mg by mouth daily., Disp: , Rfl:  .  sitaGLIPtin (JANUVIA) 100 MG tablet, Take 100 mg by mouth daily., Disp: , Rfl:  .  triamterene-hydrochlorothiazide (DYAZIDE) 37.5-25 MG capsule, Take 1 capsule by mouth daily., Disp: , Rfl:   Past Medical History: Past Medical History:  Diagnosis Date  . Arthritis   . Coronary artery disease   . Diabetes mellitus without complication ([place])   . GERD (gastroesophageal reflux disease)   . Sleep apnea     Tobacco Use: History  Smoking Status  . Never Smoker  Smokeless Tobacco  . Never Used    Labs: Recent Review Flowsheet Data    There is no flowsheet data to display.       Exercise Target Goals:    Exercise Program Goal: Individual exercise prescription set with THRR, safety & activity barriers. Participant demonstrates ability to understand and report RPE using BORG scale, to self-measure pulse accurately, and to acknowledge the  importance of the exercise prescription.  Exercise Prescription Goal: Starting with aerobic activity 30 plus minutes a day, 3 days per week for initial exercise prescription. Provide home exercise prescription and guidelines that participant acknowledges understanding prior to discharge.  Activity Barriers & Risk Stratification:     Activity Barriers & Cardiac Risk Stratification - 01/14/17 1216      Activity Barriers & Cardiac Risk Stratification   Activity Barriers Deconditioning;Muscular Weakness;Chest Pain/Angina;Arthritis  arthritis in r knee   Cardiac Risk Stratification High      6 Minute Walk:     6 Minute Walk    Row Name 01/14/17 1409         6 Minute Walk   Phase Initial     Distance 1040 feet     Walk Time 6 minutes     # of Rest Breaks 0     MPH 1.97     METS 2.55     RPE 13     VO2 Peak 8.91     Symptoms No     Resting HR 72 bpm     Resting BP 178/100  recheck 164/84     Max Ex. HR 101 bpm     Max Ex. BP 196/86     2 Minute Post BP 172/86        Oxygen Initial Assessment:   Oxygen Re-Evaluation:   Oxygen  Discharge (Final Oxygen Re-Evaluation):   Initial Exercise Prescription:     Initial Exercise Prescription - 01/14/17 1400      Date of Initial Exercise RX and Referring Provider   Date 01/14/17   Referring Provider Barrie Lyme MD     Treadmill   MPH 1.8   Grade 0   Minutes 15   METs 2.38     Recumbant Bike   Level 1   RPM 50   Watts 22   Minutes 15   METs 2.5     NuStep   Level 2   SPM 80   Minutes 15   METs 2     Prescription Details   Frequency (times per week) 3   Duration Progress to 45 minutes of aerobic exercise without signs/symptoms of physical distress     Intensity   THRR 40-80% of Max Heartrate 110-137   Ratings of Perceived Exertion 11-13   Perceived Dyspnea 0-4     Progression   Progression Continue to progress workloads to maintain intensity without signs/symptoms of physical distress.      Resistance Training   Training Prescription Yes   Weight 3 lbs   Reps 10-15      Perform Capillary Blood Glucose checks as needed.  Exercise Prescription Changes:     Exercise Prescription Changes    Row Name 01/14/17 1400 01/23/17 1400           Response to Exercise   Blood Pressure (Admit) 178/100  recheck 164/84 126/64      Blood Pressure (Exercise) 196/86 168/84      Blood Pressure (Exit) 172/86 136/74      Heart Rate (Admit) 83 bpm 75 bpm      Heart Rate (Exercise) 101 bpm 117 bpm      Heart Rate (Exit) 80 bpm 79 bpm      Oxygen Saturation (Admit) 100 %  -      Oxygen Saturation (Exercise) 96 %  -      Rating of Perceived Exertion (Exercise) 13 13      Symptoms none none      Comments walk test results  -      Duration  - Progress to 45 minutes of aerobic exercise without signs/symptoms of physical distress      Intensity  - THRR unchanged        Progression   Progression  - Continue to progress workloads to maintain intensity without signs/symptoms of physical distress.      Average METs  - 2.45        Resistance Training   Training Prescription  - Yes      Weight  - 3 lbs      Reps  - 10-15        Interval Training   Interval Training  - No        Treadmill   MPH  - 1.8      Grade  - 0      Minutes  - 15      METs  - 2.38        Recumbant Bike   Level  - 4      Watts  - 26      Minutes  - 15      METs  - 2.66        NuStep   Level  - 5      Minutes  - 15      METs  -  1.9         Exercise Comments:     Exercise Comments    Row Name 01/16/17 0919           Exercise Comments First full day of exercise!  Patient was oriented to gym and equipment including functions, settings, policies, and procedures.  Patient's individual exercise prescription and treatment plan were reviewed.  All starting workloads were established based on the results of the 6 minute walk test done at initial orientation visit.  The plan for exercise progression was  also introduced and progression will be customized based on patient's performance and goals.          Exercise Goals and Review:     Exercise Goals    Row Name 01/14/17 1413             Exercise Goals   Increase Physical Activity Yes       Intervention Provide advice, education, support and counseling about physical activity/exercise needs.;Develop an individualized exercise prescription for aerobic and resistive training based on initial evaluation findings, risk stratification, comorbidities and participant's personal goals.       Expected Outcomes Achievement of increased cardiorespiratory fitness and enhanced flexibility, muscular endurance and strength shown through measurements of functional capacity and personal statement of participant.       Increase Strength and Stamina Yes       Intervention Provide advice, education, support and counseling about physical activity/exercise needs.;Develop an individualized exercise prescription for aerobic and resistive training based on initial evaluation findings, risk stratification, comorbidities and participant's personal goals.       Expected Outcomes Achievement of increased cardiorespiratory fitness and enhanced flexibility, muscular endurance and strength shown through measurements of functional capacity and personal statement of participant.          Exercise Goals Re-Evaluation :     Exercise Goals Re-Evaluation    Row Name 01/23/17 1432             Exercise Goal Re-Evaluation   Exercise Goals Review Increase Strenth and Stamina;Increase Physical Activity       Comments [name] is off to a good start in rehab.  He has completed three exercise sessions with [place] so far.  He is already up to level 5 on the NuStep.  We will continue to monitor his progress.       Expected Outcomes Short: Review home exercise.   Long: Work on Education administrator.          Discharge Exercise Prescription (Final Exercise Prescription  Changes):     Exercise Prescription Changes - 01/23/17 1400      Response to Exercise   Blood Pressure (Admit) 126/64   Blood Pressure (Exercise) 168/84   Blood Pressure (Exit) 136/74   Heart Rate (Admit) 75 bpm   Heart Rate (Exercise) 117 bpm   Heart Rate (Exit) 79 bpm   Rating of Perceived Exertion (Exercise) 13   Symptoms none   Duration Progress to 45 minutes of aerobic exercise without signs/symptoms of physical distress   Intensity THRR unchanged     Progression   Progression Continue to progress workloads to maintain intensity without signs/symptoms of physical distress.   Average METs 2.45     Resistance Training   Training Prescription Yes   Weight 3 lbs   Reps 10-15     Interval Training   Interval Training No     Treadmill   MPH 1.8   Grade 0   Minutes  15   METs 2.38     Recumbant Bike   Level 4   Watts 26   Minutes 15   METs 2.66     NuStep   Level 5   Minutes 15   METs 1.9      Nutrition:  Target Goals: Understanding of nutrition guidelines, daily intake of sodium <1557m, cholesterol <2031m calories 30% from fat and 7% or less from saturated fats, daily to have 5 or more servings of fruits and vegetables.  Biometrics:     Pre Biometrics - 01/14/17 1413      Pre Biometrics   Height 6' 1.8" (1.875 m)   Weight 273 lb 14.4 oz (124.2 kg)   Waist Circumference 50 inches   Hip Circumference 47 inches   Waist to Hip Ratio 1.06 %   BMI (Calculated) 35.4   Single Leg Stand 9.59 seconds       Nutrition Therapy Plan and Nutrition Goals:     Nutrition Therapy & Goals - 01/14/17 1216      Nutrition Therapy   RD appointment defered Yes   Drug/Food Interactions Statins/Certain Fruits     Intervention Plan   Intervention Prescribe, educate and counsel regarding individualized specific dietary modifications aiming towards targeted core components such as weight, hypertension, lipid management, diabetes, heart failure and other  comorbidities.;Nutrition handout(s) given to patient.   Expected Outcomes Short Term Goal: Understand basic principles of dietary content, such as calories, fat, sodium, cholesterol and nutrients.;Long Term Goal: Adherence to prescribed nutrition plan.      Nutrition Discharge: Rate Your Plate Scores:     Nutrition Assessments - 01/14/17 1100      MEDFICTS Scores   Pre Score 119      Nutrition Goals Re-Evaluation:   Nutrition Goals Discharge (Final Nutrition Goals Re-Evaluation):   Psychosocial: Target Goals: Acknowledge presence or absence of significant depression and/or stress, maximize coping skills, provide positive support system. Participant is able to verbalize types and ability to use techniques and skills needed for reducing stress and depression.   Initial Review & Psychosocial Screening:     Initial Psych Review & Screening - 01/14/17 1055      Initial Review   Current issues with Current Sleep Concerns     Screening Interventions   Interventions Encouraged to exercise      Quality of Life Scores:      Quality of Life - 01/14/17 1051      Quality of Life Scores   Health/Function Pre 24.75 %   Socioeconomic Pre 30 %   Psych/Spiritual Pre 28.57 %   Family Pre 25.5 %   GLOBAL Pre 26.69 %      PHQ-9: Recent Review Flowsheet Data    [facility]   Decreased Interest 0   Down, Depressed, Hopeless 0   PHQ - 2 Score 0   Altered sleeping 0   Tired, decreased energy 3   Change in appetite 0   Feeling bad or failure about yourself  0   Trouble concentrating 0   Moving slowly or fidgety/restless 0   Suicidal thoughts 0   PHQ-9 Score 3   Difficult doing work/chores Not difficult at all     Interpretation of Total Score  Total Score Depression Severity:  1-4 = Minimal depression, 5-9 = Mild depression, 10-14 = Moderate depression, 15-19 = Moderately severe depression, 20-27 = Severe depression   Psychosocial Evaluation and  Intervention:     Psychosocial Evaluation - 01/16/17  0940      Psychosocial Evaluation & Interventions   Interventions Encouraged to exercise with the program and follow exercise prescription;Stress management education   Comments Counselor met with Mr. Sturdivant [facility]) for initial psychosocial evaluation.  He is a 71 year old who had multiple blockages resulting in a triple bypass 6 months ago - which was unsuccessful.  This resulted in subsequent procedures in April and May with 4 stents inserted, so this has been quite a journey for him.  [name] has diabetes; high blood pressure and sleep apnea in addition to his cardiac issues.  He has a strong support system with a spouse of 57 years and a son and his family (including 70 yr old twins) live locally as well.  [name] reports sleeping in his recliner and typically gets approximately 6 hrs per night.  His appetite is good and he denies a history of depression or anxiety or current symptoms.  He states he is typically in a positive mood and has minimal stress; other than his own health and that of his wife.  [name] has a very positive attitude; and appears to cope well with life - taking one day at a time.  He has goals for this program to increase his stamina and strength.  Staff will be following with [name] throughout the course of this program.     Expected Outcomes [name] will exercise consistently to achieve his stated goals.  He will benefit from the psychoeducational components of this program to help with stress in his life.  He will need to meet with the exercise physiologists to create a plan for consistent exercise upon completion of this program.     Continue Psychosocial Services  Follow up required by staff      Psychosocial Re-Evaluation:   Psychosocial Discharge (Final Psychosocial Re-Evaluation):   Vocational Rehabilitation: Provide vocational rehab assistance to qualifying candidates.   Vocational Rehab Evaluation & Intervention:      Vocational Rehab - 01/14/17 1053      Initial Vocational Rehab Evaluation & Intervention   Assessment shows need for Vocational Rehabilitation No      Education: Education Goals: Education classes will be provided on a weekly basis, covering required topics. Participant will state understanding/return demonstration of topics presented.  Learning Barriers/Preferences:     Learning Barriers/Preferences - 01/14/17 1053      Learning Barriers/Preferences   Learning Barriers None   Learning Preferences Individual Instruction      Education Topics: General Nutrition Guidelines/Fats and Fiber: -Group instruction provided by verbal, written material, models and posters to present the general guidelines for heart healthy nutrition. Gives an explanation and review of dietary fats and fiber.   Controlling Sodium/Reading Food Labels: -Group verbal and written material supporting the discussion of sodium use in heart healthy nutrition. Review and explanation with models, verbal and written materials for utilization of the food label.   Exercise Physiology & Risk Factors: - Group verbal and written instruction with models to review the exercise physiology of the cardiovascular system and associated critical values. Details cardiovascular disease risk factors and the goals associated with each risk factor.   Aerobic Exercise & Resistance Training: - Gives group verbal and written discussion on the health impact of inactivity. On the components of aerobic and resistive training programs and the benefits of this training and how to safely progress through these programs.   Flexibility, Balance, General Exercise Guidelines: - Provides group verbal and written instruction on the benefits of flexibility and  balance training programs. Provides general exercise guidelines with specific guidelines to those with heart or lung disease. Demonstration and skill practice provided.   Stress  Management: - Provides group verbal and written instruction about the health risks of elevated stress, cause of high stress, and healthy ways to reduce stress.   Depression: - Provides group verbal and written instruction on the correlation between heart/lung disease and depressed mood, treatment options, and the stigmas associated with seeking treatment.   Anatomy & Physiology of the Heart: - Group verbal and written instruction and models provide basic cardiac anatomy and physiology, with the coronary electrical and arterial systems. Review of: AMI, Angina, Valve disease, Heart Failure, Cardiac Arrhythmia, Pacemakers, and the ICD.   Cardiac Procedures: - Group verbal and written instruction and models to describe the testing methods done to diagnose heart disease. Reviews the outcomes of the test results. Describes the treatment choices: Medical Management, Angioplasty, or Coronary Bypass Surgery.   Cardiac Medications: - Group verbal and written instruction to review commonly prescribed medications for heart disease. Reviews the medication, class of the drug, and side effects. Includes the steps to properly store meds and maintain the prescription regimen.   Cardiac Rehab from 01/28/2017 in [facility] Cardiac and Pulmonary Rehab  Date  01/21/17  Educator  CE  Instruction Review Code  2- meets goals/outcomes      Go Sex-Intimacy & Heart Disease, Get SMART - Goal Setting: - Group verbal and written instruction through game format to discuss heart disease and the return to sexual intimacy. Provides group verbal and written material to discuss and apply goal setting through the application of the S.M.A.R.T. Method.   Other Matters of the Heart: - Provides group verbal, written materials and models to describe Heart Failure, Angina, Valve Disease, and Diabetes in the realm of heart disease. Includes description of the disease process and treatment options available to the cardiac  patient.   Exercise & Equipment Safety: - Individual verbal instruction and demonstration of equipment use and safety with use of the equipment.   Cardiac Rehab from 01/28/2017 in [facility] Cardiac and Pulmonary Rehab  Date  01/14/17  Educator  C. EnterkinRN  Instruction Review Code  1- partially meets, needs review/practice      Infection Prevention: - Provides verbal and written material to individual with discussion of infection control including proper hand washing and proper equipment cleaning during exercise session.   Cardiac Rehab from 01/28/2017 in [facility] Cardiac and Pulmonary Rehab  Date  01/14/17  Educator  C.EnterkinRN  Instruction Review Code  2- meets goals/outcomes      Falls Prevention: - Provides verbal and written material to individual with discussion of falls prevention and safety.   Cardiac Rehab from 01/28/2017 in [facility] Cardiac and Pulmonary Rehab  Date  01/14/17  Educator  C. Morton  Instruction Review Code  2- meets goals/outcomes      Diabetes: - Individual verbal and written instruction to review signs/symptoms of diabetes, desired ranges of glucose level fasting, after meals and with exercise. Advice that pre and post exercise glucose checks will be done for 3 sessions at entry of program.   Cardiac Rehab from 01/28/2017 in [facility] Cardiac and Pulmonary Rehab  Date  01/14/17  Educator  C. Enterkin, RN  Instruction Review Code  1- partially meets, needs review/practice       Knowledge Questionnaire Score:     Knowledge Questionnaire Score - 01/14/17 1215      Knowledge Questionnaire Score   Pre Score  1      Core Components/Risk Factors/Patient Goals at Admission:     Personal Goals and Risk Factors at Admission - 01/14/17 1217      Core Components/Risk Factors/Patient Goals on Admission    Weight Management Yes;Obesity   Intervention Weight Management: Develop a combined nutrition and exercise program designed to reach desired caloric intake,  while maintaining appropriate intake of nutrient and fiber, sodium and fats, and appropriate energy expenditure required for the weight goal.;Weight Management: Provide education and appropriate resources to help participant work on and attain dietary goals.;Weight Management/Obesity: Establish reasonable short term and long term weight goals.;Obesity: Provide education and appropriate resources to help participant work on and attain dietary goals.   Admit Weight 273 lb 14.4 oz (124.2 kg)   Goal Weight: Short Term 270 lb (122.5 kg)   Goal Weight: Long Term 260 lb (117.9 kg)   Expected Outcomes Short Term: Continue to assess and modify interventions until short term weight is achieved;Weight Maintenance: Understanding of the daily nutrition guidelines, which includes 25-35% calories from fat, 7% or less cal from saturated fats, less than 245m cholesterol, less than 1.5gm of sodium, & 5 or more servings of fruits and vegetables daily;Understanding recommendations for meals to include 15-35% energy as protein, 25-35% energy from fat, 35-60% energy from carbohydrates, less than 2071mof dietary cholesterol, 20-35 gm of total fiber daily;Long Term: Adherence to nutrition and physical activity/exercise program aimed toward attainment of established weight goal;Weight Loss: Understanding of general recommendations for a balanced deficit meal plan, which promotes 1-2 lb weight loss per week and includes a negative energy balance of [phone] kcal/d;Understanding of distribution of calorie intake throughout the day with the consumption of 4-5 meals/snacks   Diabetes Yes   Intervention Provide education about signs/symptoms and action to take for hypo/hyperglycemia.;Provide education about proper nutrition, including hydration, and aerobic/resistive exercise prescription along with prescribed medications to achieve blood glucose in normal ranges: Fasting glucose 65-99 mg/dL   Expected Outcomes Short Term: Participant  verbalizes understanding of the signs/symptoms and immediate care of hyper/hypoglycemia, proper foot care and importance of medication, aerobic/resistive exercise and nutrition plan for blood glucose control.;Long Term: Attainment of HbA1C < 7%.   Hypertension Yes   Intervention Provide education on lifestyle modifcations including regular physical activity/exercise, weight management, moderate sodium restriction and increased consumption of fresh fruit, vegetables, and low fat dairy, alcohol moderation, and smoking cessation.;Monitor prescription use compliance.   Expected Outcomes Short Term: Continued assessment and intervention until BP is < 140/9039mG in hypertensive participants. < 130/68m18m in hypertensive participants with diabetes, heart failure or chronic kidney disease.;Long Term: Maintenance of blood pressure at goal levels.   Lipids Yes   Intervention Provide education and support for participant on nutrition & aerobic/resistive exercise along with prescribed medications to achieve LDL <70mg14mL >40mg.26mxpected Outcomes Short Term: Participant states understanding of desired cholesterol values and is compliant with medications prescribed. Participant is following exercise prescription and nutrition guidelines.;Long Term: Cholesterol controlled with medications as prescribed, with individualized exercise RX and with personalized nutrition plan. Value goals: LDL < 70mg, 62m> 40 mg.      Core Components/Risk Factors/Patient Goals Review:    Core Components/Risk Factors/Patient Goals at Discharge (Final Review):    ITP Comments:     ITP Comments    Row Name 01/14/17 1218 01/30/17 0632   [id number]  ITP Comments ITP created during Medical review after Cardiac Rehab informed consent was signed  by Cindi Carbon" Lang[place]. Documentation of Diagnosis CHL/EPIC 30 day review. Continue with ITP unless directed changes per Medical Director review   New to program         Comments:

## 2017-01-30 NOTE — Progress Notes (Signed)
Daily Session Note  Patient Details  Name: [name] MRN: [id number] Date of Birth: November 23, 1945 Referring Provider:     Cardiac Rehab from 01/14/2017 in [facility] Cardiac and Pulmonary Rehab  Referring Provider  Barrie Lyme MD      Encounter Date: 01/30/2017  Check In:     Session Check In - 01/30/17 0930      Check-In   Location [facility]-Cardiac & Pulmonary Rehab   Staff Present Heath Lark, RN, BSN, CCRP;Detron Carras [place], MA, ACSM RCEP, Exercise Physiologist;Amanda Oletta Darter, BA, ACSM CEP, Exercise Physiologist   Supervising physician immediately available to respond to emergencies See telemetry face sheet for immediately available ER MD   Medication changes reported     No   Fall or balance concerns reported    No   Warm-up and Cool-down Performed on first and last piece of equipment   Resistance Training Performed Yes   VAD Patient? No     Pain Assessment   Currently in Pain? No/denies   Multiple Pain Sites No         History  Smoking Status  . Never Smoker  Smokeless Tobacco  . Never Used    Goals Met:  Independence with exercise equipment Exercise tolerated well No report of cardiac concerns or symptoms Strength training completed today  Goals Unmet:  Not Applicable  Comments: Pt able to follow exercise prescription today without complaint.  Will continue to monitor for progression. Reviewed home exercise with pt today.  Pt plans to walk at home for exercise.  Reviewed THR, pulse, RPE, sign and symptoms, NTG use, and when to call 911 or MD.  Also discussed weather considerations and indoor options.  Pt voiced understanding.    Dr. Emily Filbert is Medical Director for [place] and [name].

## 2017-02-01 ENCOUNTER — Encounter: Payer: Medicare Other | Admitting: *Deleted

## 2017-02-01 VITALS — BP 196/94 | Wt 274.2 lb

## 2017-02-01 DIAGNOSIS — Z9861 Coronary angioplasty status: Secondary | ICD-10-CM | POA: Diagnosis not present

## 2017-02-01 DIAGNOSIS — Z951 Presence of aortocoronary bypass graft: Secondary | ICD-10-CM

## 2017-02-01 NOTE — Progress Notes (Addendum)
Daily Session Note  Patient Details  Name: [name] MRN: [id number] Date of Birth: 10-04-1945 Referring Provider:     Cardiac Rehab from 01/14/2017 in [facility] Cardiac and Pulmonary Rehab  Referring Provider  Barrie Lyme MD      Encounter Date: 02/01/2017  Check In:     Session Check In - 02/01/17 0938      Check-In   Location [facility]-Cardiac & Pulmonary Rehab   Staff Present Heath Lark, RN, BSN, CCRP;Masud Holub Luan [place]ing, MA, ACSM RCEP, Exercise Physiologist;Mary Kellie Shropshire, RN, BSN, Lauretta Grill RCP,RRT,BSRT   Supervising physician immediately available to respond to emergencies See telemetry face sheet for immediately available ER MD   Medication changes reported     No   Fall or balance concerns reported    No   Warm-up and Cool-down Performed as group-led instruction   Resistance Training Performed Yes   VAD Patient? No     Pain Assessment   Currently in Pain? No/denies   Multiple Pain Sites No         History  Smoking Status  . Never Smoker  Smokeless Tobacco  . Never Used    Goals Met:  Independence with exercise equipment Exercise tolerated well No report of cardiac concerns or symptoms Strength training completed today  Goals Unmet:  Not Applicable  Comments: Pt able to follow exercise prescription today without complaint.  Will continue to monitor for progression. When [name] came in today, his blood pressure was 190/96.  He sat for awhile and recovered to 174/76.  He was able to exercise and came down throughout exercise.  After exercise he was down to 126/74.    Dr. Emily Filbert is Medical Director for [place] and [name].

## 2017-02-04 ENCOUNTER — Encounter: Payer: Medicare Other | Admitting: *Deleted

## 2017-02-04 VITALS — BP 190/98 | Wt 269.0 lb

## 2017-02-04 DIAGNOSIS — Z9861 Coronary angioplasty status: Secondary | ICD-10-CM

## 2017-02-04 DIAGNOSIS — Z951 Presence of aortocoronary bypass graft: Secondary | ICD-10-CM

## 2017-02-04 NOTE — Progress Notes (Signed)
Daily Session Note  Patient Details  Name: [name] MRN: [id number] Date of Birth: 1945/08/28 Referring Provider:     Cardiac Rehab from 01/14/2017 in [facility] Cardiac and Pulmonary Rehab  Referring Provider  Barrie Lyme MD      Encounter Date: 02/04/2017  Check In:     Session Check In - 02/04/17 0839      Check-In   Location [facility]-Cardiac & Pulmonary Rehab   Staff Present Alberteen Sam, MA, ACSM RCEP, Exercise Physiologist;Carroll Enterkin, RN, Moises Blood, BS, ACSM CEP, Exercise Physiologist   Supervising physician immediately available to respond to emergencies See telemetry face sheet for immediately available ER MD   Medication changes reported     No   Fall or balance concerns reported    No   Warm-up and Cool-down Performed on first and last piece of equipment   Resistance Training Performed Yes   VAD Patient? No     Pain Assessment   Currently in Pain? No/denies   Multiple Pain Sites No         History  Smoking Status  . Never Smoker  Smokeless Tobacco  . Never Used    Goals Met:  Independence with exercise equipment Exercise tolerated well No report of cardiac concerns or symptoms Strength training completed today  Goals Unmet:  Not Applicable  Comments: Pt able to follow exercise prescription today without complaint.  Will continue to monitor for progression.    Dr. Emily Filbert is Medical Director for [place] and [name].

## 2017-02-08 ENCOUNTER — Encounter: Payer: Medicare Other | Admitting: *Deleted

## 2017-02-08 VITALS — BP 196/94 | Wt 269.1 lb

## 2017-02-08 DIAGNOSIS — Z9861 Coronary angioplasty status: Secondary | ICD-10-CM | POA: Diagnosis not present

## 2017-02-08 DIAGNOSIS — Z951 Presence of aortocoronary bypass graft: Secondary | ICD-10-CM

## 2017-02-08 NOTE — Progress Notes (Signed)
Daily Session Note  Patient Details  Name: [name] MRN: [id number] Date of Birth: [birth year] Referring Provider:     Cardiac Rehab from 01/14/2017 in [facility] Cardiac and Pulmonary Rehab  Referring Provider  Barrie Lyme MD      Encounter Date: 02/08/2017  Check In:     Session Check In - 02/08/17 0810      Check-In   Location [facility]-Cardiac & Pulmonary Rehab   Staff Present Gerlene Burdock, RN, BSN;Diane [facility] RN,BSN   Supervising physician immediately available to respond to emergencies See telemetry face sheet for immediately available ER MD   Medication changes reported     No   Fall or balance concerns reported    No   Tobacco Cessation No Change   Warm-up and Cool-down Performed on first and last piece of equipment   Resistance Training Performed Yes   VAD Patient? No     Pain Assessment   Currently in Pain? No/denies         History  Smoking Status  . Never Smoker  Smokeless Tobacco  . Never Used    Goals Met:  Proper associated with RPD/PD & O2 Sat Exercise tolerated well No report of cardiac concerns or symptoms Strength training completed today  Goals Unmet:  Not Applicable  Comments:     Dr. Emily Filbert is Medical Director for [place] and [name].

## 2017-02-11 ENCOUNTER — Emergency Department: Payer: Medicare Other

## 2017-02-11 ENCOUNTER — Emergency Department
Admission: EM | Admit: 2017-02-11 | Discharge: 2017-02-11 | Disposition: A | Discharge status: Home / Self Care | Payer: Medicare Other | Attending: Emergency Medicine | Admitting: Emergency Medicine

## 2017-02-11 ENCOUNTER — Encounter: Payer: Medicare Other | Attending: Internal Medicine | Admitting: *Deleted

## 2017-02-11 ENCOUNTER — Encounter: Payer: Self-pay | Admitting: Emergency Medicine

## 2017-02-11 VITALS — BP 200/104 | Wt 275.0 lb

## 2017-02-11 DIAGNOSIS — Z794 Long term (current) use of insulin: Secondary | ICD-10-CM | POA: Insufficient documentation

## 2017-02-11 DIAGNOSIS — E119 Type 2 diabetes mellitus without complications: Secondary | ICD-10-CM | POA: Insufficient documentation

## 2017-02-11 DIAGNOSIS — I2510 Atherosclerotic heart disease of native coronary artery without angina pectoris: Secondary | ICD-10-CM | POA: Insufficient documentation

## 2017-02-11 DIAGNOSIS — K219 Gastro-esophageal reflux disease without esophagitis: Secondary | ICD-10-CM | POA: Insufficient documentation

## 2017-02-11 DIAGNOSIS — Z7982 Long term (current) use of aspirin: Secondary | ICD-10-CM | POA: Diagnosis not present

## 2017-02-11 DIAGNOSIS — Z7902 Long term (current) use of antithrombotics/antiplatelets: Secondary | ICD-10-CM | POA: Insufficient documentation

## 2017-02-11 DIAGNOSIS — Z951 Presence of aortocoronary bypass graft: Secondary | ICD-10-CM

## 2017-02-11 DIAGNOSIS — I10 Essential (primary) hypertension: Secondary | ICD-10-CM | POA: Insufficient documentation

## 2017-02-11 DIAGNOSIS — Z79899 Other long term (current) drug therapy: Secondary | ICD-10-CM | POA: Diagnosis not present

## 2017-02-11 DIAGNOSIS — Z7984 Long term (current) use of oral hypoglycemic drugs: Secondary | ICD-10-CM | POA: Insufficient documentation

## 2017-02-11 DIAGNOSIS — Z9861 Coronary angioplasty status: Secondary | ICD-10-CM | POA: Insufficient documentation

## 2017-02-11 LAB — COMPREHENSIVE METABOLIC PANEL
ALK PHOS: 107 U/L (ref 38–126)
ALT: 17 U/L (ref 17–63)
ANION GAP: 10 (ref 5–15)
AST: 23 U/L (ref 15–41)
Albumin: 4.4 g/dL (ref 3.5–5.0)
BUN: 20 mg/dL (ref 6–20)
CALCIUM: 10.5 mg/dL — AB (ref 8.9–10.3)
CHLORIDE: 102 mmol/L (ref 101–111)
CO2: 28 mmol/L (ref 22–32)
Creatinine, Ser: 0.9 mg/dL (ref 0.61–1.24)
GFR calc non Af Amer: >60 mL/min (ref >60)
Glucose, Bld: 160 mg/dL — ABNORMAL HIGH (ref 65–99)
POTASSIUM: 3.9 mmol/L (ref 3.5–5.1)
SODIUM: 140 mmol/L (ref 135–145)
Total Bilirubin: 0.7 mg/dL (ref 0.3–1.2)
Total Protein: 8.3 g/dL — ABNORMAL HIGH (ref 6.5–8.1)

## 2017-02-11 LAB — CBC
HCT: 40.8 % (ref 40.0–52.0)
Hemoglobin: 13.2 g/dL (ref 13.0–18.0)
MCH: 25.5 pg — AB (ref 26.0–34.0)
MCHC: 32.4 g/dL (ref 32.0–36.0)
MCV: 78.9 fL — ABNORMAL LOW (ref 80.0–100.0)
PLATELETS: 187 10*3/uL (ref 150–440)
RBC: 5.17 MIL/uL (ref 4.40–5.90)
RDW: 18.3 % — AB (ref 11.5–14.5)
WBC: 6.9 10*3/uL (ref 3.8–10.6)

## 2017-02-11 MED ORDER — AMLODIPINE BESYLATE 5 MG PO TABS
5.0000 mg | ORAL_TABLET | Freq: Once | ORAL | Status: AC
  Administered 2017-02-11: 5 mg via ORAL
  Filled 2017-02-11: qty 1

## 2017-02-11 MED ORDER — AMLODIPINE BESYLATE 5 MG PO TABS: 5.0000 mg | ORAL_TABLET | Freq: Every day | ORAL | 0 refills | Status: DC

## 2017-02-11 MED ORDER — HYDRALAZINE HCL 20 MG/ML IJ SOLN
5.0000 mg | Freq: Once | INTRAMUSCULAR | Status: AC
  Administered 2017-02-11: 5 mg via INTRAVENOUS
  Filled 2017-02-11: qty 1

## 2017-02-11 NOTE — Progress Notes (Signed)
Incomplete Session Note  Patient Details  Name: [name] MRN: [id number] Date of Birth: [birth year] Referring Provider:     Cardiac Rehab from 01/14/2017 in [facility] Cardiac and Pulmonary Rehab  Referring Provider  Barrie Lyme MD      [name] did not complete his rehab session.  We did not have him exercise since his blood pressure was up.

## 2017-02-11 NOTE — Progress Notes (Signed)
Incomplete Session Note  Patient Details  Name: [name] MRN: [id number] Date of Birth: [birth year] Referring Provider:     Cardiac Rehab from 01/14/2017 in [facility] Cardiac and Pulmonary Rehab  Referring Provider  Barrie Lyme MD      [name] did not complete his rehab session.  [name] reports his blood pressure has been up all weekend. He checked his new home blood pressure machine with staff checking manually last week and it correlated. No c/o chest pain. No shortness of breath. Blood pressure upon arrival was 220/110 and repeated 200/104. We did not allow him to exericse but took him to [place] via wheelchair.

## 2017-02-11 NOTE — ED Provider Notes (Signed)
[facility] Emergency Department Provider Note   ____________________________________________    I have reviewed the triage vital signs and the nursing notes.   HISTORY  Chief Complaint Hypertension     HPI [name] is a 71 y.o. male who presents with complaints of high blood pressure. Patient reports a history of coronary artery bypass graft in January followed by multiple stents. He went to cardiac rehabilitation today but was told that his blood pressure was high and to come to the emergency department. He feels well and has no complaints. No chest pain. No shortness of breath. No neuro complaints. Dr. Clayborn Bigness as his cardiologist. He believes he takes Benicar only for blood pressure.    Past Medical History:  Diagnosis Date  . Arthritis   . Coronary artery disease   . Diabetes mellitus without complication ([place])   . GERD (gastroesophageal reflux disease)   . Sleep apnea     Patient Active Problem List   Diagnosis Date Noted  . Diabetes ([place]) 01/14/2017  . Essential hypertension 01/14/2017  . OSA (obstructive sleep apnea) 01/14/2017  . BBB (bundle branch block) 08/30/2016  . Obesity with body mass index (BMI) of 30.0 to 39.9 08/30/2016  . 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 APPENDECTOMY    . CARDIAC CATHETERIZATION Left 08/16/2016   Procedure: Left Heart Cath and Coronary Angiography;  Surgeon: Yolonda Kida, MD;  Location: [place] CV [facility];  Service: Cardiovascular;  Laterality: Left;  . COLON SURGERY    . CORONARY ARTERY BYPASS GRAFT  08/30/2016   triple  . LEFT HEART CATH AND CORS/GRAFTS ANGIOGRAPHY Left 11/20/2016   Procedure: Left Heart Cath and Cors/Grafts Angiography;  Surgeon: Yolonda Kida, MD;  Location: [place] CV [facility];  Service: Cardiovascular;  Laterality: Left;    Prior to Admission medications   Medication Sig Start Date End Date Taking? Authorizing  Provider  aspirin EC 81 MG tablet Take 81 mg by mouth daily.   Yes [provider]  clopidogrel (PLAVIX) 75 MG tablet Take by mouth daily.  11/28/16 11/28/17 Yes [provider]  insulin glargine (LANTUS) 100 UNIT/ML injection Inject 10 Units into the skin at bedtime.   Yes [provider]  metFORMIN (GLUCOPHAGE) 1000 MG tablet Take 500 mg by mouth 2 (two) times daily with a meal.    Yes [provider]  nebivolol (BYSTOLIC) 10 MG tablet Take 10 mg by mouth daily.    Yes [provider]  olmesartan (BENICAR) 5 MG tablet Take 10 mg by mouth daily.    Yes [provider]  omeprazole (PRILOSEC) 40 MG capsule Take 40 mg by mouth daily.   Yes [provider]  rosuvastatin (CRESTOR) 20 MG tablet Take 20 mg by mouth daily.   Yes [provider]  sitaGLIPtin (JANUVIA) 100 MG tablet Take 100 mg by mouth daily.   Yes [provider]  amLODipine (NORVASC) 5 MG tablet Take 1 tablet (5 mg total) by mouth daily. 02/11/17   Lavonia Drafts, MD     Allergies Isosorbide  History reviewed. No pertinent family history.  Social History Social History  Substance Use Topics  . Smoking status: Never Smoker  . Smokeless tobacco: Never Used  . Alcohol use No    Review of Systems  Constitutional: No fever/chills Eyes: No visual changes.  ENT: No sore throat. Cardiovascular: Denies chest pain. Respiratory: Denies shortness of breath.  Gastrointestinal: No abdominal pain.  No nausea, no vomiting.   Genitourinary: Negative for frequency Musculoskeletal: Negative for back pain. Skin: Negative for rash. Neurological: Negative for headaches or weakness   ____________________________________________   PHYSICAL EXAM:  VITAL SIGNS: ED Triage Vitals [02/11/17 0830]  Enc Vitals Group     BP      Pulse      Resp      Temp      Temp src      SpO2      Weight      Height      Head Circumference      Peak Flow      Pain Score  0     Pain Loc      Pain Edu?      Excl. in [place]?     Constitutional: Alert and oriented. No acute distress. Pleasant and interactive   Nose: No congestion/rhinnorhea. Mouth/Throat: Mucous membranes are moist.   Neck:  Painless ROM Cardiovascular: Normal rate, regular rhythm. Systolic murmur. Good peripheral circulation. Respiratory: Normal respiratory effort.  No retractions. Lungs CTAB. Gastrointestinal: Soft and nontender. No distention.  No CVA tenderness. Genitourinary: deferred Musculoskeletal: 1+ edema. Warm and well perfused Neurologic:  Normal speech and language. No gross focal neurologic deficits are appreciated.  Skin:  Skin is warm, dry and intact. No rash noted. Psychiatric: Mood and affect are normal. Speech and behavior are normal.  ____________________________________________   LABS (all labs ordered are listed, but only abnormal results are displayed)  Labs Reviewed  CBC - Abnormal; Notable for the following:       Result Value   MCV 78.9 (*)    MCH 25.5 (*)    RDW 18.3 (*)    All other components within normal limits  COMPREHENSIVE METABOLIC PANEL - Abnormal; Notable for the following:    Glucose, Bld 160 (*)    Calcium 10.5 (*)    Total Protein 8.3 (*)    All other components within normal limits   ____________________________________________  EKG  ED ECG REPORT I, Lavonia Drafts, the attending physician, personally viewed and interpreted this ECG.  Date: 02/11/2017  Rhythm: normal sinus rhythm QRS Axis: normal Intervals: normal ST/T Wave abnormalities: normal Narrative Interpretation: unremarkable  ____________________________________________  RADIOLOGY  Chest x-ray unremarkable, mild cardiomegaly ____________________________________________   PROCEDURES  Procedure(s) performed: No    Critical Care performed: No ____________________________________________   INITIAL IMPRESSION / ASSESSMENT AND PLAN / ED COURSE  Pertinent  labs & imaging results that were available during my care of the patient were reviewed by me and considered in my medical decision making (see chart for details).  Patient well-appearing and in no acute distress. Blood pressure is elevated and reviewing his cardiac rehabilitation blood pressures has been elevated for some time. We will check labs, EKG discuss with cardiology regarding adjusting blood pressure medications  Discussed with Dr. Saralyn Pilar re: bp medications. He recommends adding 5mg  amlodipine.  Discussed with patient and he agrees. He continues to feel well and has no complaints. Lab work is unremarkable and the patient is asymptomatic. Has a history of high blood pressure, he will follow up with his PCP for recheck    ____________________________________________   FINAL CLINICAL IMPRESSION(S) / ED DIAGNOSES  Final diagnoses:  Essential hypertension      NEW MEDICATIONS STARTED DURING THIS VISIT:  Discharge Medication List as of 02/11/2017 12:27 PM    START taking these medications   Details  amLODipine (NORVASC) 5 MG tablet  Take 1 tablet (5 mg total) by mouth daily., Starting Mon 02/11/2017, Print         Note:  This document was prepared using Dragon voice recognition software and may include unintentional dictation errors.    Lavonia Drafts, MD 02/11/17 1330

## 2017-02-11 NOTE — ED Notes (Signed)
Unhooked patient so he could go to the bathroom.

## 2017-02-11 NOTE — ED Triage Notes (Signed)
Pt to ed with c/o HTN from cardiac rehab today.  Pt states he has had issues with this recently.  Pt denies chest pain, denies sob.  Reports mild headache.

## 2017-02-18 ENCOUNTER — Encounter: Payer: Medicare Other | Admitting: *Deleted

## 2017-02-18 DIAGNOSIS — E119 Type 2 diabetes mellitus without complications: Secondary | ICD-10-CM | POA: Diagnosis not present

## 2017-02-18 DIAGNOSIS — Z9861 Coronary angioplasty status: Secondary | ICD-10-CM | POA: Diagnosis present

## 2017-02-18 DIAGNOSIS — Z951 Presence of aortocoronary bypass graft: Secondary | ICD-10-CM

## 2017-02-18 DIAGNOSIS — K219 Gastro-esophageal reflux disease without esophagitis: Secondary | ICD-10-CM | POA: Diagnosis not present

## 2017-02-18 DIAGNOSIS — Z7902 Long term (current) use of antithrombotics/antiplatelets: Secondary | ICD-10-CM | POA: Diagnosis not present

## 2017-02-18 DIAGNOSIS — I2510 Atherosclerotic heart disease of native coronary artery without angina pectoris: Secondary | ICD-10-CM | POA: Diagnosis not present

## 2017-02-18 DIAGNOSIS — Z7982 Long term (current) use of aspirin: Secondary | ICD-10-CM | POA: Diagnosis not present

## 2017-02-18 DIAGNOSIS — Z79899 Other long term (current) drug therapy: Secondary | ICD-10-CM | POA: Diagnosis not present

## 2017-02-18 NOTE — Progress Notes (Signed)
Daily Session Note  Patient Details  Name: [name] MRN: [id number] Date of Birth: 12-12-1945 Referring Provider:     Cardiac Rehab from 01/14/2017 in [facility] Cardiac and Pulmonary Rehab  Referring Provider  Barrie Lyme MD      Encounter Date: 02/18/2017  Check In:     Session Check In - 02/18/17 0854      Check-In   Location [facility]-Cardiac & Pulmonary Rehab   Staff Present Gerlene Burdock, RN, Moises Blood, BS, ACSM CEP, Exercise Physiologist;Holly Pring Frederico Hamman, RN BSN   Supervising physician immediately available to respond to emergencies See telemetry face sheet for immediately available ER MD   Medication changes reported     No   Fall or balance concerns reported    No   Tobacco Cessation No Change   Warm-up and Cool-down Performed on first and last piece of equipment   Resistance Training Performed Yes   VAD Patient? No     Pain Assessment   Currently in Pain? No/denies         History  Smoking Status  . Never Smoker  Smokeless Tobacco  . Never Used    Goals Met:  Independence with exercise equipment Exercise tolerated well No report of cardiac concerns or symptoms Strength training completed today  Goals Unmet:  Not Applicable  Comments: Pt able to follow exercise prescription today without complaint.  Will continue to monitor for progression.    Dr. Emily Filbert is Medical Director for [place] and [name].

## 2017-02-20 DIAGNOSIS — Z951 Presence of aortocoronary bypass graft: Secondary | ICD-10-CM

## 2017-02-20 DIAGNOSIS — Z9861 Coronary angioplasty status: Secondary | ICD-10-CM | POA: Diagnosis not present

## 2017-02-20 LAB — GLUCOSE, CAPILLARY: Glucose-Capillary: 179 mg/dL — ABNORMAL HIGH (ref 65–99)

## 2017-02-20 NOTE — Progress Notes (Signed)
Daily Session Note  Patient Details  Name: [name] MRN: [id number] Date of Birth: 06-04-1946 Referring Provider:     Cardiac Rehab from 01/14/2017 in [facility] Cardiac and Pulmonary Rehab  Referring Provider  Barrie Lyme MD      Encounter Date: 02/20/2017  Check In:     Session Check In - 02/20/17 0812      Check-In   Location [facility]-Cardiac & Pulmonary Rehab   Staff Present Nyoka Cowden, RN, BSN, MA;Susanne Bice, RN, BSN, CCRP;Melquiades Kovar Flavia Shipper   Supervising physician immediately available to respond to emergencies See telemetry face sheet for immediately available ER MD   Medication changes reported     No   Fall or balance concerns reported    No   Tobacco Cessation No Change   Warm-up and Cool-down Performed on first and last piece of equipment   Resistance Training Performed Yes   VAD Patient? No     Pain Assessment   Currently in Pain? No/denies   Multiple Pain Sites No           Exercise Prescription Changes - 02/19/17 1000      Response to Exercise   Blood Pressure (Admit) 160/80   Blood Pressure (Exercise) 192/88   Blood Pressure (Exit) 176/80   Heart Rate (Admit) 81 bpm   Heart Rate (Exercise) 93 bpm   Heart Rate (Exit) 80 bpm   Rating of Perceived Exertion (Exercise) 13   Symptoms none   Duration Continue with 45 min of aerobic exercise without signs/symptoms of physical distress.   Intensity THRR unchanged     Progression   Progression Continue to progress workloads to maintain intensity without signs/symptoms of physical distress.   Average METs 2.65     Resistance Training   Training Prescription Yes   Weight 3   Reps 10-15     Interval Training   Interval Training No     NuStep   Level 6   Minutes 15   METs 3.8     REL-XR   Level 2   Minutes 30   METs 1.5      History  Smoking Status  . Never Smoker  Smokeless Tobacco  . Never Used    Goals Met:  Independence with exercise equipment Exercise tolerated  well No report of cardiac concerns or symptoms Strength training completed today  Goals Unmet:  Not Applicable  Comments: Pt able to follow exercise prescription today without complaint.  Will continue to monitor for progression.   Dr. Emily Filbert is Medical Director for [place] and [name].

## 2017-02-22 ENCOUNTER — Encounter: Payer: Medicare Other | Admitting: *Deleted

## 2017-02-22 DIAGNOSIS — Z9861 Coronary angioplasty status: Secondary | ICD-10-CM | POA: Diagnosis not present

## 2017-02-22 DIAGNOSIS — Z951 Presence of aortocoronary bypass graft: Secondary | ICD-10-CM

## 2017-02-22 NOTE — Progress Notes (Signed)
Daily Session Note  Patient Details  Name: [name] MRN: [id number] Date of Birth: Dec 16, 1945 Referring Provider:     Cardiac Rehab from 01/14/2017 in [facility] Cardiac and Pulmonary Rehab  Referring Provider  Barrie Lyme MD      Encounter Date: 02/22/2017  Check In:     Session Check In - 02/22/17 0846      Check-In   Location [facility]-Cardiac & Pulmonary Rehab   Staff Present Nyoka Cowden, RN, BSN, MA;Susanne Bice, RN, BSN, Lance Sell, BA, ACSM CEP, Exercise Physiologist   Supervising physician immediately available to respond to emergencies See telemetry face sheet for immediately available ER MD   Medication changes reported     No   Fall or balance concerns reported    No   Warm-up and Cool-down Performed on first and last piece of equipment   Resistance Training Performed Yes   VAD Patient? No     Pain Assessment   Currently in Pain? No/denies         History  Smoking Status  . Never Smoker  Smokeless Tobacco  . Never Used    Goals Met:  Independence with exercise equipment Exercise tolerated well No report of cardiac concerns or symptoms Strength training completed today  Goals Unmet:  Not Applicable  Comments: Doing well with exercise prescription progression.    Dr. Emily Filbert is Medical Director for [place] and [name].

## 2017-02-27 ENCOUNTER — Encounter: Payer: Self-pay | Admitting: *Deleted

## 2017-02-27 DIAGNOSIS — Z9861 Coronary angioplasty status: Secondary | ICD-10-CM

## 2017-02-27 DIAGNOSIS — Z951 Presence of aortocoronary bypass graft: Secondary | ICD-10-CM

## 2017-02-27 NOTE — Progress Notes (Signed)
Daily Session Note  Patient Details  Name: [name] MRN: [id number] Date of Birth: [birth year] Referring Provider:     Cardiac Rehab from 01/14/2017 in [facility] Cardiac and Pulmonary Rehab  Referring Provider  Barrie Lyme MD      Encounter Date: 02/27/2017  Check In:     Session Check In - 02/27/17 0803      Check-In   Location [facility]-Cardiac & Pulmonary Rehab   Staff Present Alberteen Sam, MA, ACSM RCEP, Exercise Physiologist;Krista Frederico Hamman, RN BSN;[place] Denis Flavia Shipper   Supervising physician immediately available to respond to emergencies See telemetry face sheet for immediately available ER MD   Medication changes reported     Yes   Comments Norvasc dose was increased    Fall or balance concerns reported    No   Tobacco Cessation No Change   Warm-up and Cool-down Performed on first and last piece of equipment   Resistance Training Performed Yes   VAD Patient? No     Pain Assessment   Currently in Pain? No/denies   Multiple Pain Sites No         History  Smoking Status  . Never Smoker  Smokeless Tobacco  . Never Used    Goals Met:  Independence with exercise equipment Exercise tolerated well No report of cardiac concerns or symptoms Strength training completed today  Goals Unmet:  Not Applicable  Comments: Pt able to follow exercise prescription today without complaint.  Will continue to monitor for progression.   Dr. Emily Filbert is Medical Director for [place] and [name].

## 2017-02-27 NOTE — Progress Notes (Signed)
Cardiac Individual Treatment Plan  Patient Details  Name: [name] MRN: [id number] Date of Birth: 1946-06-27 Referring Provider:     Cardiac Rehab from 01/14/2017 in [facility] Cardiac and Pulmonary Rehab  Referring Provider  Barrie Lyme MD      Initial Encounter Date:    Cardiac Rehab from 01/14/2017 in [facility] Cardiac and Pulmonary Rehab  Date  01/14/17  Referring Provider  Barrie Lyme MD      Visit Diagnosis: S/P CABG x 3  S/P PTCA (percutaneous transluminal coronary angioplasty)  Patient's Home Medications on Admission:  Current Outpatient Prescriptions:  .  amLODipine (NORVASC) 5 MG tablet, Take 1 tablet (5 mg total) by mouth daily., Disp: 30 tablet, Rfl: 0 .  aspirin EC 81 MG tablet, Take 81 mg by mouth daily., Disp: , Rfl:  .  clopidogrel (PLAVIX) 75 MG tablet, Take by mouth daily. , Disp: , Rfl:  .  insulin glargine (LANTUS) 100 UNIT/ML injection, Inject 10 Units into the skin at bedtime., Disp: , Rfl:  .  metFORMIN (GLUCOPHAGE) 1000 MG tablet, Take 500 mg by mouth 2 (two) times daily with a meal. , Disp: , Rfl:  .  nebivolol (BYSTOLIC) 10 MG tablet, Take 10 mg by mouth daily. , Disp: , Rfl:  .  olmesartan (BENICAR) 5 MG tablet, Take 10 mg by mouth daily. , Disp: , Rfl:  .  omeprazole (PRILOSEC) 40 MG capsule, Take 40 mg by mouth daily., Disp: , Rfl:  .  rosuvastatin (CRESTOR) 20 MG tablet, Take 20 mg by mouth daily., Disp: , Rfl:  .  sitaGLIPtin (JANUVIA) 100 MG tablet, Take 100 mg by mouth daily., Disp: , Rfl:   Past Medical History: Past Medical History:  Diagnosis Date  . Arthritis   . Coronary artery disease   . Diabetes mellitus without complication ([place])   . GERD (gastroesophageal reflux disease)   . Sleep apnea     Tobacco Use: History  Smoking Status  . Never Smoker  Smokeless Tobacco  . Never Used    Labs: Recent Review Flowsheet Data    There is no flowsheet data to display.       Exercise Target Goals:    Exercise Program  Goal: Individual exercise prescription set with THRR, safety & activity barriers. Participant demonstrates ability to understand and report RPE using BORG scale, to self-measure pulse accurately, and to acknowledge the importance of the exercise prescription.  Exercise Prescription Goal: Starting with aerobic activity 30 plus minutes a day, 3 days per week for initial exercise prescription. Provide home exercise prescription and guidelines that participant acknowledges understanding prior to discharge.  Activity Barriers & Risk Stratification:     Activity Barriers & Cardiac Risk Stratification - 01/14/17 1216      Activity Barriers & Cardiac Risk Stratification   Activity Barriers Deconditioning;Muscular Weakness;Chest Pain/Angina;Arthritis  arthritis in r knee   Cardiac Risk Stratification High      6 Minute Walk:     6 Minute Walk    Row Name 01/14/17 1409         6 Minute Walk   Phase Initial     Distance 1040 feet     Walk Time 6 minutes     # of Rest Breaks 0     MPH 1.97     METS 2.55     RPE 13     VO2 Peak 8.91     Symptoms No     Resting HR 72 bpm  Resting BP 178/100  recheck 164/84     Max Ex. HR 101 bpm     Max Ex. BP 196/86     2 Minute Post BP 172/86        Oxygen Initial Assessment:   Oxygen Re-Evaluation:   Oxygen Discharge (Final Oxygen Re-Evaluation):   Initial Exercise Prescription:     Initial Exercise Prescription - 01/14/17 1400      Date of Initial Exercise RX and Referring Provider   Date 01/14/17   Referring Provider Barrie Lyme MD     Treadmill   MPH 1.8   Grade 0   Minutes 15   METs 2.38     Recumbant Bike   Level 1   RPM 50   Watts 22   Minutes 15   METs 2.5     NuStep   Level 2   SPM 80   Minutes 15   METs 2     Prescription Details   Frequency (times per week) 3   Duration Progress to 45 minutes of aerobic exercise without signs/symptoms of physical distress     Intensity   THRR 40-80% of Max  Heartrate 110-137   Ratings of Perceived Exertion 11-13   Perceived Dyspnea 0-4     Progression   Progression Continue to progress workloads to maintain intensity without signs/symptoms of physical distress.     Resistance Training   Training Prescription Yes   Weight 3 lbs   Reps 10-15      Perform Capillary Blood Glucose checks as needed.  Exercise Prescription Changes:     Exercise Prescription Changes    Row Name 01/14/17 1400 01/23/17 1400 01/30/17 0900 02/05/17 1100 02/19/17 1000     Response to Exercise   Blood Pressure (Admit) 178/100  recheck 164/84 126/64  - 132/60 160/80   Blood Pressure (Exercise) 196/86 168/84  - 158/92 192/88   Blood Pressure (Exit) 172/86 136/74  - 124/60 176/80   Heart Rate (Admit) 83 bpm 75 bpm  - 77 bpm 81 bpm   Heart Rate (Exercise) 101 bpm 117 bpm  - 97 bpm 93 bpm   Heart Rate (Exit) 80 bpm 79 bpm  - 67 bpm 80 bpm   Oxygen Saturation (Admit) 100 %  -  -  -  -   Oxygen Saturation (Exercise) 96 %  -  -  -  -   Rating of Perceived Exertion (Exercise) 13 13  - 13 13   Symptoms _0    Comments walk test results  -  -  -  -   Duration  - Progress to 45 minutes of aerobic exercise without signs/symptoms of physical distress Progress to 45 minutes of aerobic exercise without signs/symptoms of physical distress Continue with 45 min of aerobic exercise without signs/symptoms of physical distress. Continue with 45 min of aerobic exercise without signs/symptoms of physical distress.   Intensity  - THRR unchanged THRR unchanged THRR unchanged THRR unchanged     Progression   Progression  - Continue to progress workloads to maintain intensity without signs/symptoms of physical distress. Continue to progress workloads to maintain intensity without signs/symptoms of physical distress. Continue to progress workloads to maintain intensity without signs/symptoms of physical distress. Continue to progress workloads to maintain intensity  without signs/symptoms of physical distress.   Average METs  - 2.45 2.45 2.46 2.65     Resistance Training   Training Prescription  - Yes Yes Yes Yes   Weight  -  3 lbs 3 lbs 3 lbs 3   Reps  - 10-15 10-15 10-15 10-15     Interval Training   Interval Training  - No No No No     Treadmill   MPH  - 1.8 1.8 1.8  -   Grade  - 0 0 0  -   Minutes  - _0 -   METs  - 2.38 2.38 2.38  -     Recumbant Bike   Level  - _1 -   Watts  - _2 -   Minutes  - _3 -   METs  - 2.66 2.66 2.5  -     NuStep   Level  - _4 Minutes  - _5 METs  - 1.9 1.9 2.5 3.8     REL-XR   Level  -  -  -  - 2   Minutes  -  -  -  - 30   METs  -  -  -  - 1.5     Home Exercise Plan   Plans to continue exercise at  -  - Home (comment)  walking Home (comment)  walking  -   Frequency  -  - Add 2 additional days to program exercise sessions. Add 2 additional days to program exercise sessions.  -   Initial Home Exercises Provided  -  - 01/30/17 01/30/17  -      Exercise Comments:     Exercise Comments    Row Name 01/16/17 0919           Exercise Comments First full day of exercise!  Patient was oriented to gym and equipment including functions, settings, policies, and procedures.  Patient's individual exercise prescription and treatment plan were reviewed.  All starting workloads were established based on the results of the 6 minute walk test done at initial orientation visit.  The plan for exercise progression was also introduced and progression will be customized based on patient's performance and goals.          Exercise Goals and Review:     Exercise Goals    Row Name 01/14/17 1413             Exercise Goals   Increase Physical Activity Yes       Intervention Provide advice, education, support and counseling about physical activity/exercise needs.;Develop an individualized exercise prescription for aerobic and resistive training based on initial evaluation  findings, risk stratification, comorbidities and participant's personal goals.       Expected Outcomes Achievement of increased cardiorespiratory fitness and enhanced flexibility, muscular endurance and strength shown through measurements of functional capacity and personal statement of participant.       Increase Strength and Stamina Yes       Intervention Provide advice, education, support and counseling about physical activity/exercise needs.;Develop an individualized exercise prescription for aerobic and resistive training based on initial evaluation findings, risk stratification, comorbidities and participant's personal goals.       Expected Outcomes Achievement of increased cardiorespiratory fitness and enhanced flexibility, muscular endurance and strength shown through measurements of functional capacity and personal statement of participant.          Exercise Goals Re-Evaluation :     Exercise Goals Re-Evaluation    Row Name 01/23/17 1432 01/30/17 0932 02/05/17 1058 02/19/17 1020  Exercise Goal Re-Evaluation   Exercise Goals Review Increase Strenth and Stamina;Increase Physical Activity Increase Strenth and Stamina;Increase Physical Activity Increase Strenth and Stamina;Increase Physical Activity Increase Physical Activity;Increase Strenth and Stamina    Comments [name] is off to a good start in rehab.  He has completed three exercise sessions with [place] so far.  He is already up to level 5 on the NuStep.  We will continue to monitor his progress. Reviewed home exercise with pt today.  Pt plans to walk at home for exercise.  Reviewed THR, pulse, RPE, sign and symptoms, NTG use, and when to call 911 or MD.  Also discussed weather considerations and indoor options.  Pt voiced understanding. [name] has been doing well in rehab.  He is now using the XR and walking some as his foot is feeling a little better.  We will continue to monitor his progression.   [name] is progressing well with exercise and  has increased MET level.    Expected Outcomes Short: Review home exercise.   Long: Work on Education administrator. Short: Add in at least one day of exercise at home.  Long: Make exercise part of routine. Short: Continue to walk some.  Long: Contine to exercise at home as well. [name] will continue to attend regularly and increase overall strength and stamina.       Discharge Exercise Prescription (Final Exercise Prescription Changes):     Exercise Prescription Changes - 02/19/17 1000      Response to Exercise   Blood Pressure (Admit) 160/80   Blood Pressure (Exercise) 192/88   Blood Pressure (Exit) 176/80   Heart Rate (Admit) 81 bpm   Heart Rate (Exercise) 93 bpm   Heart Rate (Exit) 80 bpm   Rating of Perceived Exertion (Exercise) 13   Symptoms none   Duration Continue with 45 min of aerobic exercise without signs/symptoms of physical distress.   Intensity THRR unchanged     Progression   Progression Continue to progress workloads to maintain intensity without signs/symptoms of physical distress.   Average METs 2.65     Resistance Training   Training Prescription Yes   Weight 3   Reps 10-15     Interval Training   Interval Training No     NuStep   Level 6   Minutes 15   METs 3.8     REL-XR   Level 2   Minutes 30   METs 1.5      Nutrition:  Target Goals: Understanding of nutrition guidelines, daily intake of sodium <1546m, cholesterol <2054m calories 30% from fat and 7% or less from saturated fats, daily to have 5 or more servings of fruits and vegetables.  Biometrics:     Pre Biometrics - 01/14/17 1413      Pre Biometrics   Height 6' 1.8" (1.875 m)   Weight 273 lb 14.4 oz (124.2 kg)   Waist Circumference 50 inches   Hip Circumference 47 inches   Waist to Hip Ratio 1.06 %   BMI (Calculated) 35.4   Single Leg Stand 9.59 seconds       Nutrition Therapy Plan and Nutrition Goals:     Nutrition Therapy & Goals - 02/08/17 0822      Nutrition  Therapy   RD appointment defered Yes      Nutrition Discharge: Rate Your Plate Scores:     Nutrition Assessments - 01/14/17 1100      MEDFICTS Scores   Pre Score 119  Nutrition Goals Re-Evaluation:     Nutrition Goals Re-Evaluation    [place] Name 02/08/17 0823             Goals   Current Weight 269 lb (122 kg)       Comment [name] feels like he doesn't need to see the dietician.           Nutrition Goals Discharge (Final Nutrition Goals Re-Evaluation):     Nutrition Goals Re-Evaluation - 02/08/17 6948      Goals   Current Weight 269 lb (122 kg)   Comment [name] feels like he doesn't need to see the dietician.       Psychosocial: Target Goals: Acknowledge presence or absence of significant depression and/or stress, maximize coping skills, provide positive support system. Participant is able to verbalize types and ability to use techniques and skills needed for reducing stress and depression.   Initial Review & Psychosocial Screening:     Initial Psych Review & Screening - 01/14/17 1055      Initial Review   Current issues with Current Sleep Concerns     Screening Interventions   Interventions Encouraged to exercise      Quality of Life Scores:      Quality of Life - 01/14/17 1051      Quality of Life Scores   Health/Function Pre 24.75 %   Socioeconomic Pre 30 %   Psych/Spiritual Pre 28.57 %   Family Pre 25.5 %   GLOBAL Pre 26.69 %      PHQ-9: Recent Review Flowsheet Data    Depression screen [facility] 2/9 01/14/2017   Decreased Interest 0   Down, Depressed, Hopeless 0   PHQ - 2 Score 0   Altered sleeping 0   Tired, decreased energy 3   Change in appetite 0   Feeling bad or failure about yourself  0   Trouble concentrating 0   Moving slowly or fidgety/restless 0   Suicidal thoughts 0   PHQ-9 Score 3   Difficult doing work/chores Not difficult at all     Interpretation of Total Score  Total Score Depression Severity:  1-4 = Minimal  depression, 5-9 = Mild depression, 10-14 = Moderate depression, 15-19 = Moderately severe depression, 20-27 = Severe depression   Psychosocial Evaluation and Intervention:     Psychosocial Evaluation - 02/08/17 0829      Psychosocial Evaluation & Interventions   Comments [name] just said "I don't sleep more than an hour so I am not going to mess with any sleep apnea machines. I sleep in a recliner still. [name] is still helping his wife who is on oxgyen but he says she cooks  a little then sits since she is short of breath but won't do Pulm Rehab.       Psychosocial Re-Evaluation:     Psychosocial Re-Evaluation    Row Name 02/08/17 [phone]             Psychosocial Re-Evaluation   Comments see info under Evaluation since I typed it in there . Sleep is the same. Stress with his wife is the same.        Interventions Encouraged to attend Cardiac Rehabilitation for the exercise       Continue Psychosocial Services  Follow up required by staff          Psychosocial Discharge (Final Psychosocial Re-Evaluation):     Psychosocial Re-Evaluation - 02/08/17 0829      Psychosocial Re-Evaluation   Comments see info  under Evaluation since I typed it in there . Sleep is the same. Stress with his wife is the same.    Interventions Encouraged to attend Cardiac Rehabilitation for the exercise   Continue Psychosocial Services  Follow up required by staff      Vocational Rehabilitation: Provide vocational rehab assistance to qualifying candidates.   Vocational Rehab Evaluation & Intervention:     Vocational Rehab - 01/14/17 1053      Initial Vocational Rehab Evaluation & Intervention   Assessment shows need for Vocational Rehabilitation No      Education: Education Goals: Education classes will be provided on a weekly basis, covering required topics. Participant will state understanding/return demonstration of topics presented.  Learning Barriers/Preferences:     Learning  Barriers/Preferences - 01/14/17 1053      Learning Barriers/Preferences   Learning Barriers None   Learning Preferences Individual Instruction      Education Topics: General Nutrition Guidelines/Fats and Fiber: -Group instruction provided by verbal, written material, models and posters to present the general guidelines for heart healthy nutrition. Gives an explanation and review of dietary fats and fiber.   Cardiac Rehab from 02/20/2017 in [facility] Cardiac and Pulmonary Rehab  Date  02/04/17  Educator  CR  Instruction Review Code  2- meets goals/outcomes      Controlling Sodium/Reading Food Labels: -Group verbal and written material supporting the discussion of sodium use in heart healthy nutrition. Review and explanation with models, verbal and written materials for utilization of the food label.   Exercise Physiology & Risk Factors: - Group verbal and written instruction with models to review the exercise physiology of the cardiovascular system and associated critical values. Details cardiovascular disease risk factors and the goals associated with each risk factor.   Cardiac Rehab from 02/20/2017 in [facility] Cardiac and Pulmonary Rehab  Date  02/18/17  Educator  [facility]  Instruction Review Code  2- meets goals/outcomes      Aerobic Exercise & Resistance Training: - Gives group verbal and written discussion on the health impact of inactivity. On the components of aerobic and resistive training programs and the benefits of this training and how to safely progress through these programs.   Cardiac Rehab from 02/20/2017 in [facility] Cardiac and Pulmonary Rehab  Date  02/20/17  Educator  SB  Instruction Review Code  2- meets goals/outcomes      Flexibility, Balance, General Exercise Guidelines: - Provides group verbal and written instruction on the benefits of flexibility and balance training programs. Provides general exercise guidelines with specific guidelines to those with heart or lung  disease. Demonstration and skill practice provided.   Stress Management: - Provides group verbal and written instruction about the health risks of elevated stress, cause of high stress, and healthy ways to reduce stress.   Depression: - Provides group verbal and written instruction on the correlation between heart/lung disease and depressed mood, treatment options, and the stigmas associated with seeking treatment.   Anatomy & Physiology of the Heart: - Group verbal and written instruction and models provide basic cardiac anatomy and physiology, with the coronary electrical and arterial systems. Review of: AMI, Angina, Valve disease, Heart Failure, Cardiac Arrhythmia, Pacemakers, and the ICD.   Cardiac Procedures: - Group verbal and written instruction and models to describe the testing methods done to diagnose heart disease. Reviews the outcomes of the test results. Describes the treatment choices: Medical Management, Angioplasty, or Coronary Bypass Surgery.   Cardiac Medications: - Group verbal and written instruction to review  commonly prescribed medications for heart disease. Reviews the medication, class of the drug, and side effects. Includes the steps to properly store meds and maintain the prescription regimen.   Cardiac Rehab from 02/20/2017 in [facility] Cardiac and Pulmonary Rehab  Date  01/21/17  Educator  CE  Instruction Review Code  2- meets goals/outcomes      Go Sex-Intimacy & Heart Disease, Get SMART - Goal Setting: - Group verbal and written instruction through game format to discuss heart disease and the return to sexual intimacy. Provides group verbal and written material to discuss and apply goal setting through the application of the S.M.A.R.T. Method.   Other Matters of the Heart: - Provides group verbal, written materials and models to describe Heart Failure, Angina, Valve Disease, and Diabetes in the realm of heart disease. Includes description of the disease  process and treatment options available to the cardiac patient.   Exercise & Equipment Safety: - Individual verbal instruction and demonstration of equipment use and safety with use of the equipment.   Cardiac Rehab from 02/20/2017 in [facility] Cardiac and Pulmonary Rehab  Date  01/14/17  Educator  C. EnterkinRN  Instruction Review Code  1- partially meets, needs review/practice      Infection Prevention: - Provides verbal and written material to individual with discussion of infection control including proper hand washing and proper equipment cleaning during exercise session.   Cardiac Rehab from 02/20/2017 in [facility] Cardiac and Pulmonary Rehab  Date  01/14/17  Educator  C.[email or web address]  Instruction Review Code  2- meets goals/outcomes      Falls Prevention: - Provides verbal and written material to individual with discussion of falls prevention and safety.   Cardiac Rehab from 02/20/2017 in [facility] Cardiac and Pulmonary Rehab  Date  01/14/17  Educator  C. [place]  Instruction Review Code  2- meets goals/outcomes      Diabetes: - Individual verbal and written instruction to review signs/symptoms of diabetes, desired ranges of glucose level fasting, after meals and with exercise. Advice that pre and post exercise glucose checks will be done for 3 sessions at entry of program.   Cardiac Rehab from 02/20/2017 in [facility] Cardiac and Pulmonary Rehab  Date  01/14/17  Educator  C. Enterkin, RN  Instruction Review Code  1- partially meets, needs review/practice       Knowledge Questionnaire Score:     Knowledge Questionnaire Score - 01/14/17 1215      Knowledge Questionnaire Score   Pre Score 1      Core Components/Risk Factors/Patient Goals at Admission:     Personal Goals and Risk Factors at Admission - 01/14/17 1217      Core Components/Risk Factors/Patient Goals on Admission    Weight Management Yes;Obesity   Intervention Weight Management: Develop a combined nutrition and  exercise program designed to reach desired caloric intake, while maintaining appropriate intake of nutrient and fiber, sodium and fats, and appropriate energy expenditure required for the weight goal.;Weight Management: Provide education and appropriate resources to help participant work on and attain dietary goals.;Weight Management/Obesity: Establish reasonable short term and long term weight goals.;Obesity: Provide education and appropriate resources to help participant work on and attain dietary goals.   Admit Weight 273 lb 14.4 oz (124.2 kg)   Goal Weight: Short Term 270 lb (122.5 kg)   Goal Weight: Long Term 260 lb (117.9 kg)   Expected Outcomes Short Term: Continue to assess and modify interventions until short term weight is achieved;Weight Maintenance: Understanding of the daily  nutrition guidelines, which includes 25-35% calories from fat, 7% or less cal from saturated fats, less than 229m cholesterol, less than 1.5gm of sodium, & 5 or more servings of fruits and vegetables daily;Understanding recommendations for meals to include 15-35% energy as protein, 25-35% energy from fat, 35-60% energy from carbohydrates, less than 2035mof dietary cholesterol, 20-35 gm of total fiber daily;Long Term: Adherence to nutrition and physical activity/exercise program aimed toward attainment of established weight goal;Weight Loss: Understanding of general recommendations for a balanced deficit meal plan, which promotes 1-2 lb weight loss per week and includes a negative energy balance of [phone] kcal/d;Understanding of distribution of calorie intake throughout the day with the consumption of 4-5 meals/snacks   Diabetes Yes   Intervention Provide education about signs/symptoms and action to take for hypo/hyperglycemia.;Provide education about proper nutrition, including hydration, and aerobic/resistive exercise prescription along with prescribed medications to achieve blood glucose in normal ranges: Fasting glucose  65-99 mg/dL   Expected Outcomes Short Term: Participant verbalizes understanding of the signs/symptoms and immediate care of hyper/hypoglycemia, proper foot care and importance of medication, aerobic/resistive exercise and nutrition plan for blood glucose control.;Long Term: Attainment of HbA1C < 7%.   Hypertension Yes   Intervention Provide education on lifestyle modifcations including regular physical activity/exercise, weight management, moderate sodium restriction and increased consumption of fresh fruit, vegetables, and low fat dairy, alcohol moderation, and smoking cessation.;Monitor prescription use compliance.   Expected Outcomes Short Term: Continued assessment and intervention until BP is < 140/9057mG in hypertensive participants. < 130/26m44m in hypertensive participants with diabetes, heart failure or chronic kidney disease.;Long Term: Maintenance of blood pressure at goal levels.   Lipids Yes   Intervention Provide education and support for participant on nutrition & aerobic/resistive exercise along with prescribed medications to achieve LDL <70mg42mL >40mg.38mxpected Outcomes Short Term: Participant states understanding of desired cholesterol values and is compliant with medications prescribed. Participant is following exercise prescription and nutrition guidelines.;Long Term: Cholesterol controlled with medications as prescribed, with individualized exercise RX and with personalized nutrition plan. Value goals: LDL < 70mg, 66m> 40 mg.      Core Components/Risk Factors/Patient Goals Review:      Goals and Risk Factor Review    Row Name 02/08/17 0824 02/08/17 0826 02/08/17 0827         Core Components/Risk Factors/Patient Goals Review   Personal Goals Review Weight Management/Obesity Weight Management/Obesity Diabetes;Hypertension;Stress     Review 269lbs today. Stress still with his wife "she said I have to do it and order her a dress or clothes since she has gained alot of  weight due to her illness". [name] repClare Gandys today is the first day that he has felt better since the MD increased his Benicar on Monday and he finally doesn't have a headache. [name] gotClare Gandynew blood pressure machine since he had his last one for 18 years and it wasn't correlating with us [place] it manually.  [name] hasClare Gandyst 4lbs.  [name]'s blood pressure and he is feeling better since MD increased his Benicar on Monday. [name] repClare Gandys he finally doesn't have a headache. He helps take care of his wife who takes a few bites then is so short of breath even on oxgyen that she has to rest in between bites of food.      Expected Outcomes  -  - Heart healthy lifestyle. Feeling better since MD increased his Benicar-to cont this.         Core Components/Risk  Factors/Patient Goals at Discharge (Final Review):      Goals and Risk Factor Review - 02/08/17 0827      Core Components/Risk Factors/Patient Goals Review   Personal Goals Review Diabetes;Hypertension;Stress   Review [name]'s blood pressure and he is feeling better since MD increased his Benicar on Monday. [name] reports he finally doesn't have a headache. He helps take care of his wife who takes a few bites then is so short of breath even on oxgyen that she has to rest in between bites of food.    Expected Outcomes Heart healthy lifestyle. Feeling better since MD increased his Benicar-to cont this.       ITP Comments:     ITP Comments    Row Name 01/14/17 1218 01/30/17 2550 01/30/17 0634 02/01/17 0943 02/11/17 0829   ITP Comments ITP created during Medical review after Cardiac Rehab informed consent was signed by Cindi Carbon" [name]. Documentation of Diagnosis CHL/EPIC 30 day review. Continue with ITP unless directed changes per Medical Director review   New to program 30 day review. Continue with ITP unless directed changes per Medical Director review   New to program When [name] came in today, his blood pressure was 190/96.  He sat for awhile and recovered to 174/76.   He was able to exercise and came down throughout exercise.  After exercise he was down to 126/74.  [name] arrived today in Cardiac REhab with a high blood pressure after he reported that [place] increased his benicar but his blood pressure was still up all weekend.    [place] Name 02/11/17 [phone] 02/11/17 1634 02/27/17 0624 02/27/17 0625     ITP Comments Taken to [facility] Emergency Dept since [place] said he has been worried all weekend about his blood pressure which has been high.  [name] was discharged and added amlodipine 5 mg daily 30 day review. Continue with ITP unless directed changes per Medical Director review    30 day review. Continue with ITP unless directed changes per Medical Director review          Comments:

## 2017-03-01 DIAGNOSIS — Z951 Presence of aortocoronary bypass graft: Secondary | ICD-10-CM

## 2017-03-01 DIAGNOSIS — Z9861 Coronary angioplasty status: Secondary | ICD-10-CM

## 2017-03-01 NOTE — Progress Notes (Signed)
Daily Session Note  Patient Details  Name: [name] MRN: [id number] Date of Birth: 08/25/1945 Referring Provider:     Cardiac Rehab from 01/14/2017 in [facility] Cardiac and Pulmonary Rehab  Referring Provider  [place], Bruce MD      Encounter Date: 03/01/2017  Check In:     Session Check In - 03/01/17 0847      Check-In   Location [facility]-Cardiac & Pulmonary Rehab   Staff Present Diane Wright RN,BSN;Amanda Sommer, BA, ACSM CEP, Exercise Physiologist;Jessica Hawkins, MA, ACSM RCEP, Exercise Physiologist   Supervising physician immediately available to respond to emergencies See telemetry face sheet for immediately available ER MD   Medication changes reported     No   Warm-up and Cool-down Performed on first and last piece of equipment   Resistance Training Performed Yes   VAD Patient? No     Pain Assessment   Currently in Pain? No/denies         History  Smoking Status  . Never Smoker  Smokeless Tobacco  . Never Used    Goals Met:  Independence with exercise equipment Exercise tolerated well No report of cardiac concerns or symptoms Strength training completed today  Goals Unmet:  Not Applicable  Comments: Pt able to follow exercise prescription today without complaint.  Will continue to monitor for progression.    Dr. Mark Miller is Medical Director for [facility] and [name]. 

## 2017-03-04 ENCOUNTER — Encounter: Payer: Medicare Other | Admitting: *Deleted

## 2017-03-04 DIAGNOSIS — Z9861 Coronary angioplasty status: Secondary | ICD-10-CM | POA: Diagnosis not present

## 2017-03-04 DIAGNOSIS — Z951 Presence of aortocoronary bypass graft: Secondary | ICD-10-CM

## 2017-03-04 NOTE — Progress Notes (Signed)
Daily Session Note  Patient Details  Name: [name] MRN: [id number] Date of Birth: Dec 23, 1945 Referring Provider:     Cardiac Rehab from 01/14/2017 in [facility] Cardiac and Pulmonary Rehab  Referring Provider  Barrie Lyme MD      Encounter Date: 03/04/2017  Check In:     Session Check In - 03/04/17 0746      Check-In   Location [facility]Cardiac & Pulmonary Rehab   Staff Present Gerlene Burdock, RN, Levie Heritage, MA, ACSM RCEP, Exercise Physiologist;Dyson Sevey Amedeo Plenty, BS, ACSM CEP, Exercise Physiologist   Supervising physician immediately available to respond to emergencies See telemetry face sheet for immediately available ER MD   Medication changes reported     No   Fall or balance concerns reported    No   Warm-up and Cool-down Performed on first and last piece of equipment   Resistance Training Performed Yes   VAD Patient? No     Pain Assessment   Currently in Pain? No/denies   Multiple Pain Sites No         History  Smoking Status  . Never Smoker  Smokeless Tobacco  . Never Used    Goals Met:  Independence with exercise equipment Exercise tolerated well No report of cardiac concerns or symptoms Strength training completed today  Goals Unmet:  Not Applicable  Comments: Pt able to follow exercise prescription today without complaint.  Will continue to monitor for progression.    Dr. Emily Filbert is Medical Director for [place] and [name].

## 2017-03-08 ENCOUNTER — Encounter: Payer: Medicare Other | Admitting: *Deleted

## 2017-03-08 DIAGNOSIS — Z9861 Coronary angioplasty status: Secondary | ICD-10-CM

## 2017-03-08 DIAGNOSIS — Z951 Presence of aortocoronary bypass graft: Secondary | ICD-10-CM

## 2017-03-08 NOTE — Progress Notes (Signed)
Daily Session Note  Patient Details  Name: NAMARI BRETON MRN: [id number] Date of Birth: [birth year] Referring Provider:     Cardiac Rehab from 01/14/2017 in [facility] Cardiac and Pulmonary Rehab  Referring Provider  Barrie Lyme MD      Encounter Date: 03/08/2017  Check In:     Session Check In - 03/08/17 0837      Check-In   Staff Present Alberteen Sam, MA, ACSM RCEP, Exercise Physiologist;Carroll Enterkin, RN, Vickki Hearing, BA, ACSM CEP, Exercise Physiologist   Supervising physician immediately available to respond to emergencies See telemetry face sheet for immediately available ER MD   Medication changes reported     No   Fall or balance concerns reported    No   Warm-up and Cool-down Performed on first and last piece of equipment   Resistance Training Performed Yes   VAD Patient? No     Pain Assessment   Currently in Pain? No/denies   Multiple Pain Sites No         History  Smoking Status  . Never Smoker  Smokeless Tobacco  . Never Used    Goals Met:  Independence with exercise equipment Exercise tolerated well Personal goals reviewed No report of cardiac concerns or symptoms Strength training completed today  Goals Unmet:  Not Applicable  Comments: Pt able to follow exercise prescription today without complaint.  Will continue to monitor for progression.    Dr. Emily Filbert is Medical Director for [place] and [name].

## 2017-03-11 ENCOUNTER — Encounter: Payer: Medicare Other | Admitting: *Deleted

## 2017-03-11 DIAGNOSIS — Z9861 Coronary angioplasty status: Secondary | ICD-10-CM | POA: Diagnosis not present

## 2017-03-11 DIAGNOSIS — Z951 Presence of aortocoronary bypass graft: Secondary | ICD-10-CM

## 2017-03-11 NOTE — Progress Notes (Signed)
Daily Session Note  Patient Details  Name: [name] MRN: [id number] Date of Birth: 1945/10/02 Referring Provider:     Cardiac Rehab from 01/14/2017 in [facility] Cardiac and Pulmonary Rehab  Referring Provider  Barrie Lyme MD      Encounter Date: 03/11/2017  Check In:     Session Check In - 03/11/17 0748      Check-In   Location [facility]Cardiac & [facility] Re[facility]   Staff Present Gerlene Burdock, RN, Levie Heritage, MA, ACSM RCEP, Exercise Physiologist;Kelly Amedeo Plenty, BS, ACSM CEP, Exercise Physiologist   Supervising physician immediately available to respond to emergencies See telemetry face sheet for immediately available ER MD   Medication changes reported     No   Fall or balance concerns reported    No   Warm-up and Cool-down Performed on first and last piece of equipment   Resistance Training Performed Yes   VAD Patient? No     Pain Assessment   Currently in Pain? No/denies   Multiple Pain Sites No         History  Smoking Status  . Never Smoker  Smokeless Tobacco  . Never Used    Goals Met:  Independence with exercise equipment Exercise tolerated well No report of cardiac concerns or symptoms Strength training completed today  Goals Unmet:  Not Applicable  Comments: Pt able to follow exercise prescription today without complaint.  Will continue to monitor for progression.    Dr. Emily Filbert is Medical Director for [place] and [name].

## 2017-03-13 ENCOUNTER — Encounter: Payer: Medicare Other | Attending: Internal Medicine

## 2017-03-13 DIAGNOSIS — Z79899 Other long term (current) drug therapy: Secondary | ICD-10-CM | POA: Diagnosis not present

## 2017-03-13 DIAGNOSIS — Z951 Presence of aortocoronary bypass graft: Secondary | ICD-10-CM

## 2017-03-13 DIAGNOSIS — K219 Gastro-esophageal reflux disease without esophagitis: Secondary | ICD-10-CM | POA: Diagnosis not present

## 2017-03-13 DIAGNOSIS — I2510 Atherosclerotic heart disease of native coronary artery without angina pectoris: Secondary | ICD-10-CM | POA: Diagnosis not present

## 2017-03-13 DIAGNOSIS — Z9861 Coronary angioplasty status: Secondary | ICD-10-CM | POA: Insufficient documentation

## 2017-03-13 DIAGNOSIS — E119 Type 2 diabetes mellitus without complications: Secondary | ICD-10-CM | POA: Diagnosis not present

## 2017-03-13 DIAGNOSIS — Z7902 Long term (current) use of antithrombotics/antiplatelets: Secondary | ICD-10-CM | POA: Insufficient documentation

## 2017-03-13 DIAGNOSIS — Z7982 Long term (current) use of aspirin: Secondary | ICD-10-CM | POA: Insufficient documentation

## 2017-03-13 NOTE — Progress Notes (Signed)
Daily Session Note  Patient Details  Name: [name] MRN: [id number] Date of Birth: [birth year] Referring Provider:     Cardiac Rehab from 01/14/2017 in [facility] Cardiac and Pulmonary Rehab  Referring Provider  Barrie Lyme MD      Encounter Date: 03/13/2017  Check In:     Session Check In - 03/13/17 0755      Check-In   Location [facility]Card[facility] & [facility]monary Rehab   Staff Present Alberteen Sam, MA, ACSM RCEP, Exercise Physiologist;Susanne Bice, RN, BSN, CCRP;Dermot Gremillion Flavia Shipper   Supervising physician immediately available to respond to emergencies See telemetry face sheet for immediately available ER MD   Medication changes reported     No   Fall or balance concerns reported    No   Warm-up and Cool-down Performed on first and last piece of equipment   Resistance Training Performed Yes   VAD Patient? No     Pain Assessment   Currently in Pain? No/denies   Multiple Pain Sites No         History  Smoking Status  . Never Smoker  Smokeless Tobacco  . Never Used    Goals Met:  Independence with exercise equipment Exercise tolerated well No report of cardiac concerns or symptoms Strength training completed today  Goals Unmet:  Not Applicable  Comments: Pt able to follow exercise prescription today without complaint.  Will continue to monitor for progression.   Dr. Emily Filbert is Medical Director for [place] and [name].

## 2017-03-18 ENCOUNTER — Encounter: Payer: Medicare Other | Admitting: *Deleted

## 2017-03-18 DIAGNOSIS — Z9861 Coronary angioplasty status: Secondary | ICD-10-CM | POA: Diagnosis not present

## 2017-03-18 DIAGNOSIS — Z951 Presence of aortocoronary bypass graft: Secondary | ICD-10-CM

## 2017-03-18 NOTE — Progress Notes (Signed)
Daily Session Note  Patient Details  Name: [name] MRN: [id number] Date of Birth: 1945-12-06 Referring Provider:     Cardiac Rehab from 01/14/2017 in [facility] Cardiac and Pulmonary Rehab  Referring Provider  Barrie Lyme MD      Encounter Date: 03/18/2017  Check In:     Session Check In - 03/18/17 0753      Check-In   Location [facility] Re[facility]   Staff Present Gerlene Burdock, RN, Levie Heritage, MA, ACSM RCEP, Exercise Physiologist;Kelly Amedeo Plenty, BS, ACSM CEP, Exercise Physiologist   Supervising physician immediately available to respond to emergencies See telemetry face sheet for immediately available ER MD   Medication changes reported     No   Fall or balance concerns reported    No   Warm-up and Cool-down Performed on first and last piece of equipment   Resistance Training Performed Yes   VAD Patient? No     Pain Assessment   Currently in Pain? No/denies   Multiple Pain Sites No         History  Smoking Status  . Never Smoker  Smokeless Tobacco  . Never Used    Goals Met:  Independence with exercise equipment Exercise tolerated well No report of cardiac concerns or symptoms Strength training completed today  Goals Unmet:  Not Applicable  Comments: Pt able to follow exercise prescription today without complaint.  Will continue to monitor for progression.   Dr. Emily Filbert is Medical Director for [place] and [name].

## 2017-03-20 ENCOUNTER — Encounter: Payer: Medicare Other | Admitting: *Deleted

## 2017-03-20 DIAGNOSIS — Z9861 Coronary angioplasty status: Secondary | ICD-10-CM

## 2017-03-20 DIAGNOSIS — Z951 Presence of aortocoronary bypass graft: Secondary | ICD-10-CM

## 2017-03-20 NOTE — Progress Notes (Signed)
Daily Session Note  Patient Details  Name: [name] MRN: [id number] Date of Birth: 1946-07-21 Referring Provider:     Cardiac Rehab from 01/14/2017 in [facility] Cardiac and Pulmonary Rehab  Referring Provider  Barrie Lyme MD      Encounter Date: 03/20/2017  Check In:     Session Check In - 03/20/17 0801      Check-In   Location [facility] Re[facility]   Staff Present Gerlene Burdock, RN, Levie Heritage, MA, ACSM RCEP, Exercise Physiologist;Joseph Flavia Shipper   Supervising physician immediately available to respond to emergencies See telemetry face sheet for immediately available ER MD   Medication changes reported     No   Fall or balance concerns reported    No   Warm-up and Cool-down Performed on first and last piece of equipment   Resistance Training Performed Yes   VAD Patient? No     Pain Assessment   Currently in Pain? No/denies   Multiple Pain Sites No         History  Smoking Status  . Never Smoker  Smokeless Tobacco  . Never Used    Goals Met:  Independence with exercise equipment Exercise tolerated well No report of cardiac concerns or symptoms Strength training completed today  Goals Unmet:  Not Applicable  Comments: [name]'s blood pressure was elevated on the XR today.  He was 212/84, workload was reduced and recheck was 172/80.  He was able to continue to exercise and stayed in the 170s.  He had started the day in the 160s.  He is looking into getting a new blood pressure cuff for home use, as the one he has is reading him higher than what we are getting here.  He was able to cool down and return his pressure to normal.    Dr. Emily Filbert is Medical Director for [place] and [name].

## 2017-03-20 NOTE — Progress Notes (Signed)
Daily Session Note  Patient Details  Name: ZYREN SEVIGNY MRN: [id number] Date of Birth: 03-13-1946 Referring Provider:     Cardiac Rehab from 01/14/2017 in [facility] Cardiac and Pulmonary Rehab  Referring Provider  Barrie Lyme MD      Encounter Date: 03/20/2017  Check In:     Session Check In - 03/20/17 0801      Check-In   Location [facility] Re[facility]   Staff Present Gerlene Burdock, RN, Levie Heritage, MA, ACSM RCEP, Exercise Physiologist;Joseph Flavia Shipper   Supervising physician immediately available to respond to emergencies See telemetry face sheet for immediately available ER MD   Medication changes reported     No   Fall or balance concerns reported    No   Warm-up and Cool-down Performed on first and last piece of equipment   Resistance Training Performed Yes   VAD Patient? No     Pain Assessment   Currently in Pain? No/denies   Multiple Pain Sites No         History  Smoking Status  . Never Smoker  Smokeless Tobacco  . Never Used    Goals Met:  Proper associated with RPD/PD & O2 Sat Exercise tolerated well No report of cardiac concerns or symptoms Strength training completed today  Goals Unmet:  Not Applicable  Comments:     Dr. Emily Filbert is Medical Director for [place] and [name].

## 2017-03-22 ENCOUNTER — Encounter: Payer: Medicare Other | Admitting: *Deleted

## 2017-03-22 DIAGNOSIS — Z951 Presence of aortocoronary bypass graft: Secondary | ICD-10-CM

## 2017-03-22 DIAGNOSIS — Z9861 Coronary angioplasty status: Secondary | ICD-10-CM | POA: Diagnosis not present

## 2017-03-22 NOTE — Progress Notes (Signed)
Daily Session Note  Patient Details  Name: [name] MRN: [id number] Date of Birth: [birth year] Referring Provider:     Cardiac Rehab from 01/14/2017 in [facility] Cardiac and Pulmonary Rehab  Referring Provider  Jerry Lyme MD      Encounter Date: 03/22/2017  Check In:     Session Check In - 03/22/17 0752      Check-In   Location [facility]-Cardiac & Pulmonary Rehab   Staff Present Heath Lark, RN, BSN, CCRP;Jessica Luan Pulling, MA, ACSM RCEP, Exercise Physiologist;Latoshia Monrroy Flavia Shipper   Supervising physician immediately available to respond to emergencies See telemetry face sheet for immediately available ER MD   Medication changes reported     No   Fall or balance concerns reported    No   Warm-up and Cool-down Performed on first and last piece of equipment   Resistance Training Performed Yes   VAD Patient? No     Pain Assessment   Currently in Pain? No/denies   Multiple Pain Sites No           Exercise Prescription Changes - 03/22/17 0800      Response to Exercise   Blood Pressure (Admit) 162/76   Blood Pressure (Exercise) 212/84  rck 172/80   Blood Pressure (Exit) 138/70   Heart Rate (Admit) 68 bpm   Heart Rate (Exercise) 91 bpm   Heart Rate (Exit) 67 bpm   Rating of Perceived Exertion (Exercise) 14   Symptoms chest pain and htn   Duration Continue with 45 min of aerobic exercise without signs/symptoms of physical distress.   Intensity THRR unchanged     Progression   Progression Continue to progress workloads to maintain intensity without signs/symptoms of physical distress.   Average METs 3.81     Resistance Training   Training Prescription Yes   Weight 3 lbs   Reps 10-15     Interval Training   Interval Training No     Recumbant Bike   Level 9   Watts 43   Minutes 15   METs 3.13     NuStep   Level 6   Minutes 15   METs 2.6     REL-XR   Level 11   Minutes 15   METs 5.7     Home Exercise Plan   Plans to continue exercise at Home  (comment)  walking   Frequency Add 2 additional days to program exercise sessions.   Initial Home Exercises Provided 01/30/17      History  Smoking Status  . Never Smoker  Smokeless Tobacco  . Never Used    Goals Met:  Independence with exercise equipment Exercise tolerated well Strength training completed today  Goals Unmet:  Not Applicable  Comments: Patient has some angina today with a 4/10 pain. He took a nitro tablet that brought his blood pressure down to 158/82 from 182/98. He completed the session today and states he has no chest pain after the nitro tablet. [name] states that if varies day to day with his angina.    Dr. Emily Filbert is Medical Director for [place] and [name].

## 2017-03-27 ENCOUNTER — Encounter: Payer: Self-pay | Admitting: *Deleted

## 2017-03-27 DIAGNOSIS — Z951 Presence of aortocoronary bypass graft: Secondary | ICD-10-CM

## 2017-03-27 DIAGNOSIS — Z9861 Coronary angioplasty status: Secondary | ICD-10-CM

## 2017-03-27 NOTE — Progress Notes (Signed)
Daily Session Note  Patient Details  Name: KELDAN EPLIN MRN: [id number] Date of Birth: August 08, 1946 Referring Provider:     Cardiac Rehab from 01/14/2017 in [facility] Cardiac and Pulmonary Rehab  Referring Provider  Barrie Lyme MD      Encounter Date: 03/27/2017  Check In:     Session Check In - 03/27/17 0744      Check-In   Location [facility]Cardiac [facility] Pulmonary Rehab   Staff Present Heath Lark, RN, BSN, CCRP;Jessica Luan Pulling, MA, ACSM RCEP, Exercise Physiologist;Joseph Flavia Shipper   Supervising physician immediately available to respond to emergencies See telemetry face sheet for immediately available ER MD   Medication changes reported     No   Fall or balance concerns reported    No   Warm-up and Cool-down Performed on first and last piece of equipment   Resistance Training Performed Yes   VAD Patient? No     Pain Assessment   Currently in Pain? No/denies   Multiple Pain Sites No         History  Smoking Status  . Never Smoker  Smokeless Tobacco  . Never Used    Goals Met:  Independence with exercise equipment Exercise tolerated well No report of cardiac concerns or symptoms Strength training completed today  Goals Unmet:  Not Applicable  Comments: Pt able to follow exercise prescription today without complaint.  Will continue to monitor for progression.  [name]'s blood pressure was elevated again today coming in and during exercise.  He worked at reduced workloads to keep it from getting too high.  He was encouraged to contact his doctor.  He said he would call if the headaches returned. He does have an appointment in two weeks.   Dr. Emily Filbert is Medical Director for [place] and [name].

## 2017-03-27 NOTE — Progress Notes (Signed)
Cardiac Individual Treatment Plan  Patient Details  Name: [name] MRN: [id number] Date of Birth: 1946-06-27 Referring Provider:     Cardiac Rehab from 01/14/2017 in [facility] Cardiac and Pulmonary Rehab  Referring Provider  Barrie Lyme MD      Initial Encounter Date:    Cardiac Rehab from 01/14/2017 in [facility] Cardiac and Pulmonary Rehab  Date  01/14/17  Referring Provider  Barrie Lyme MD      Visit Diagnosis: S/P CABG x 3  S/P PTCA (percutaneous transluminal coronary angioplasty)  Patient's Home Medications on Admission:  Current Outpatient Prescriptions:  .  amLODipine (NORVASC) 5 MG tablet, Take 1 tablet (5 mg total) by mouth daily., Disp: 30 tablet, Rfl: 0 .  aspirin EC 81 MG tablet, Take 81 mg by mouth daily., Disp: , Rfl:  .  clopidogrel (PLAVIX) 75 MG tablet, Take by mouth daily. , Disp: , Rfl:  .  insulin glargine (LANTUS) 100 UNIT/ML injection, Inject 10 Units into the skin at bedtime., Disp: , Rfl:  .  metFORMIN (GLUCOPHAGE) 1000 MG tablet, Take 500 mg by mouth 2 (two) times daily with a meal. , Disp: , Rfl:  .  nebivolol (BYSTOLIC) 10 MG tablet, Take 10 mg by mouth daily. , Disp: , Rfl:  .  olmesartan (BENICAR) 5 MG tablet, Take 10 mg by mouth daily. , Disp: , Rfl:  .  omeprazole (PRILOSEC) 40 MG capsule, Take 40 mg by mouth daily., Disp: , Rfl:  .  rosuvastatin (CRESTOR) 20 MG tablet, Take 20 mg by mouth daily., Disp: , Rfl:  .  sitaGLIPtin (JANUVIA) 100 MG tablet, Take 100 mg by mouth daily., Disp: , Rfl:   Past Medical History: Past Medical History:  Diagnosis Date  . Arthritis   . Coronary artery disease   . Diabetes mellitus without complication ([place])   . GERD (gastroesophageal reflux disease)   . Sleep apnea     Tobacco Use: History  Smoking Status  . Never Smoker  Smokeless Tobacco  . Never Used    Labs: Recent Review Flowsheet Data    There is no flowsheet data to display.       Exercise Target Goals:    Exercise Program  Goal: Individual exercise prescription set with THRR, safety & activity barriers. Participant demonstrates ability to understand and report RPE using BORG scale, to self-measure pulse accurately, and to acknowledge the importance of the exercise prescription.  Exercise Prescription Goal: Starting with aerobic activity 30 plus minutes a day, 3 days per week for initial exercise prescription. Provide home exercise prescription and guidelines that participant acknowledges understanding prior to discharge.  Activity Barriers & Risk Stratification:     Activity Barriers & Cardiac Risk Stratification - 01/14/17 1216      Activity Barriers & Cardiac Risk Stratification   Activity Barriers Deconditioning;Muscular Weakness;Chest Pain/Angina;Arthritis  arthritis in r knee   Cardiac Risk Stratification High      6 Minute Walk:     6 Minute Walk    Row Name 01/14/17 1409         6 Minute Walk   Phase Initial     Distance 1040 feet     Walk Time 6 minutes     # of Rest Breaks 0     MPH 1.97     METS 2.55     RPE 13     VO2 Peak 8.91     Symptoms No     Resting HR 72 bpm  Resting BP 178/100  recheck 164/84     Max Ex. HR 101 bpm     Max Ex. BP 196/86     2 Minute Post BP 172/86        Oxygen Initial Assessment:   Oxygen Re-Evaluation:   Oxygen Discharge (Final Oxygen Re-Evaluation):   Initial Exercise Prescription:     Initial Exercise Prescription - 01/14/17 1400      Date of Initial Exercise RX and Referring Provider   Date 01/14/17   Referring Provider Barrie Lyme MD     Treadmill   MPH 1.8   Grade 0   Minutes 15   METs 2.38     Recumbant Bike   Level 1   RPM 50   Watts 22   Minutes 15   METs 2.5     NuStep   Level 2   SPM 80   Minutes 15   METs 2     Prescription Details   Frequency (times per week) 3   Duration Progress to 45 minutes of aerobic exercise without signs/symptoms of physical distress     Intensity   THRR 40-80% of Max  Heartrate 110-137   Ratings of Perceived Exertion 11-13   Perceived Dyspnea 0-4     Progression   Progression Continue to progress workloads to maintain intensity without signs/symptoms of physical distress.     Resistance Training   Training Prescription Yes   Weight 3 lbs   Reps 10-15      Perform Capillary Blood Glucose checks as needed.  Exercise Prescription Changes:     Exercise Prescription Changes    Row Name 01/14/17 1400 01/23/17 1400 01/30/17 0900 02/05/17 1100 02/19/17 1000     Response to Exercise   Blood Pressure (Admit) 178/100  recheck 164/84 126/64  - 132/60 160/80   Blood Pressure (Exercise) 196/86 168/84  - 158/92 192/88   Blood Pressure (Exit) 172/86 136/74  - 124/60 176/80   Heart Rate (Admit) 83 bpm 75 bpm  - 77 bpm 81 bpm   Heart Rate (Exercise) 101 bpm 117 bpm  - 97 bpm 93 bpm   Heart Rate (Exit) 80 bpm 79 bpm  - 67 bpm 80 bpm   Oxygen Saturation (Admit) 100 %  -  -  -  -   Oxygen Saturation (Exercise) 96 %  -  -  -  -   Rating of Perceived Exertion (Exercise) 13 13  - 13 13   Symptoms _0    Comments walk test results  -  -  -  -   Duration  - Progress to 45 minutes of aerobic exercise without signs/symptoms of physical distress Progress to 45 minutes of aerobic exercise without signs/symptoms of physical distress Continue with 45 min of aerobic exercise without signs/symptoms of physical distress. Continue with 45 min of aerobic exercise without signs/symptoms of physical distress.   Intensity  - THRR unchanged THRR unchanged THRR unchanged THRR unchanged     Progression   Progression  - Continue to progress workloads to maintain intensity without signs/symptoms of physical distress. Continue to progress workloads to maintain intensity without signs/symptoms of physical distress. Continue to progress workloads to maintain intensity without signs/symptoms of physical distress. Continue to progress workloads to maintain intensity  without signs/symptoms of physical distress.   Average METs  - 2.45 2.45 2.46 2.65     Resistance Training   Training Prescription  - Yes Yes Yes Yes   Weight  -  3 lbs 3 lbs 3 lbs 3   Reps  - 10-15 10-15 10-15 10-15     Interval Training   Interval Training  - No No No No     Treadmill   MPH  - 1.8 1.8 1.8  -   Grade  - 0 0 0  -   Minutes  - _0 -   METs  - 2.38 2.38 2.38  -     Recumbant Bike   Level  - _1 -   Watts  - _2 -   Minutes  - _3 -   METs  - 2.66 2.66 2.5  -     NuStep   Level  - _4 Minutes  - _5 METs  - 1.9 1.9 2.5 3.8     REL-XR   Level  -  -  -  - 2   Minutes  -  -  -  - 30   METs  -  -  -  - 1.5     Home Exercise Plan   Plans to continue exercise at  -  - Home (comment)  walking Home (comment)  walking  -   Frequency  -  - Add 2 additional days to program exercise sessions. Add 2 additional days to program exercise sessions.  -   Initial Home Exercises Provided  -  - 01/30/17 01/30/17  -   [place] Name 03/06/17 1500 03/22/17 0800           Response to Exercise   Blood Pressure (Admit) 136/74 162/76      Blood Pressure (Exercise) 162/82 212/84  rck 172/80      Blood Pressure (Exit) 162/88 138/70      Heart Rate (Admit) 75 bpm 68 bpm      Heart Rate (Exercise) 94 bpm 91 bpm      Heart Rate (Exit) 67 bpm 67 bpm      Rating of Perceived Exertion (Exercise) 13 14      Symptoms none chest pain and htn      Duration Continue with 45 min of aerobic exercise without signs/symptoms of physical distress. Continue with 45 min of aerobic exercise without signs/symptoms of physical distress.      Intensity THRR unchanged THRR unchanged        Progression   Progression Continue to progress workloads to maintain intensity without signs/symptoms of physical distress. Continue to progress workloads to maintain intensity without signs/symptoms of physical distress.      Average METs 3.35 3.81        Resistance Training    Training Prescription Yes Yes      Weight 3 lbs 3 lbs      Reps 10-15 10-15        Interval Training   Interval Training No No        Recumbant Bike   Level 4 9      Watts 29 43      Minutes 15 15      METs 2.76 3.13        NuStep   Level 6 6      Minutes 15 15      METs 3.7 2.6        REL-XR   Level 6 11      Minutes 15 15      METs  3.6 5.7        Home Exercise Plan   Plans to continue exercise at Home (comment)  walking Home (comment)  walking      Frequency Add 2 additional days to program exercise sessions. Add 2 additional days to program exercise sessions.      Initial Home Exercises Provided 01/30/17 01/30/17         Exercise Comments:     Exercise Comments    Row Name 01/16/17 0919 03/20/17 0848         Exercise Comments First full day of exercise!  Patient was oriented to gym and equipment including functions, settings, policies, and procedures.  Patient's individual exercise prescription and treatment plan were reviewed.  All starting workloads were established based on the results of the 6 minute walk test done at initial orientation visit.  The plan for exercise progression was also introduced and progression will be customized based on patient's performance and goals. [name]'s blood pressure was elevated on the XR today.  He was 212/84, workload was reduced and recheck was 172/80.  He was able to continue to exercise and stayed in the 170s.  He had started the day in the 160s.  He is looking into getting a new blood pressure cuff for home use, as the one he has is reading him higher than what we are getting here.  He was able to cool down and return his pressure to normal.          Exercise Goals and Review:     Exercise Goals    Row Name 01/14/17 1413             Exercise Goals   Increase Physical Activity Yes       Intervention Provide advice, education, support and counseling about physical activity/exercise needs.;Develop an individualized  exercise prescription for aerobic and resistive training based on initial evaluation findings, risk stratification, comorbidities and participant's personal goals.       Expected Outcomes Achievement of increased cardiorespiratory fitness and enhanced flexibility, muscular endurance and strength shown through measurements of functional capacity and personal statement of participant.       Increase Strength and Stamina Yes       Intervention Provide advice, education, support and counseling about physical activity/exercise needs.;Develop an individualized exercise prescription for aerobic and resistive training based on initial evaluation findings, risk stratification, comorbidities and participant's personal goals.       Expected Outcomes Achievement of increased cardiorespiratory fitness and enhanced flexibility, muscular endurance and strength shown through measurements of functional capacity and personal statement of participant.          Exercise Goals Re-Evaluation :     Exercise Goals Re-Evaluation    Row Name 01/23/17 1432 01/30/17 0932 02/05/17 1058 02/19/17 1020 03/06/17 1538     Exercise Goal Re-Evaluation   Exercise Goals Review Increase Strenth and Stamina;Increase Physical Activity Increase Strenth and Stamina;Increase Physical Activity Increase Strenth and Stamina;Increase Physical Activity Increase Physical Activity;Increase Strenth and Stamina Increase Physical Activity;Increase Strenth and Stamina   Comments [name] is off to a good start in rehab.  He has completed three exercise sessions with [place] so far.  He is already up to level 5 on the NuStep.  We will continue to monitor his progress. Reviewed home exercise with pt today.  Pt plans to walk at home for exercise.  Reviewed THR, pulse, RPE, sign and symptoms, NTG use, and when to call 911 or MD.  Also discussed weather  considerations and indoor options.  Pt voiced understanding. [name] has been doing well in rehab.  He is now using the  XR and walking some as his foot is feeling a little better.  We will continue to monitor his progression.   [name] is progressing well with exercise and has increased MET level. [name] continues to do well in rehab.  His blood pressures have been doing better.  He is up to level 6 on everything.  We will continue to monitor his progression.   Expected Outcomes Short: Review home exercise.   Long: Work on Education administrator. Short: Add in at least one day of exercise at home.  Long: Make exercise part of routine. Short: Continue to walk some.  Long: Contine to exercise at home as well. [name] will continue to attend regularly and increase overall strength and stamina. Short: Continue to keep a close on eye on his blood pressures.   Long: Continue to exercise routinely.   [place] Name 03/08/17 5056 03/22/17 0834           Exercise Goal Re-Evaluation   Exercise Goals Review Increase Strenth and Stamina;Increase Physical Activity Increase Strenth and Stamina;Increase Physical Activity      Comments [name] has been getting stronger. He has really noticed a difference in his leg strength with all of the exercise.  He is doing some at home with strength and stretching.  We talked about increasing his cardio at home.    [name] has been doing well in rehab.  He does develop some chest pain during exercise from time to time.  He is still hesistant to exercise more at home.  We will continue to monitor his progression.       Expected Outcomes Short: Add in more cardio.  Long: Make exercise part of daily routine. Short: Add in more exercise at home.  Long: Make exercise part of his normal routine.          Discharge Exercise Prescription (Final Exercise Prescription Changes):     Exercise Prescription Changes - 03/22/17 0800      Response to Exercise   Blood Pressure (Admit) 162/76   Blood Pressure (Exercise) 212/84  rck 172/80   Blood Pressure (Exit) 138/70   Heart Rate (Admit) 68 bpm   Heart Rate (Exercise)  91 bpm   Heart Rate (Exit) 67 bpm   Rating of Perceived Exertion (Exercise) 14   Symptoms chest pain and htn   Duration Continue with 45 min of aerobic exercise without signs/symptoms of physical distress.   Intensity THRR unchanged     Progression   Progression Continue to progress workloads to maintain intensity without signs/symptoms of physical distress.   Average METs 3.81     Resistance Training   Training Prescription Yes   Weight 3 lbs   Reps 10-15     Interval Training   Interval Training No     Recumbant Bike   Level 9   Watts 43   Minutes 15   METs 3.13     NuStep   Level 6   Minutes 15   METs 2.6     REL-XR   Level 11   Minutes 15   METs 5.7     Home Exercise Plan   Plans to continue exercise at Home (comment)  walking   Frequency Add 2 additional days to program exercise sessions.   Initial Home Exercises Provided 01/30/17      Nutrition:  Target Goals: Understanding of  nutrition guidelines, daily intake of sodium <1571m, cholesterol <2037m calories 30% from fat and 7% or less from saturated fats, daily to have 5 or more servings of fruits and vegetables.  Biometrics:     Pre Biometrics - 01/14/17 1413      Pre Biometrics   Height 6' 1.8" (1.875 m)   Weight 273 lb 14.4 oz (124.2 kg)   Waist Circumference 50 inches   Hip Circumference 47 inches   Waist to Hip Ratio 1.06 %   BMI (Calculated) 35.4   Single Leg Stand 9.59 seconds       Nutrition Therapy Plan and Nutrition Goals:     Nutrition Therapy & Goals - 03/08/17 0850      Nutrition Therapy   RD appointment defered Yes      Nutrition Discharge: Rate Your Plate Scores:     Nutrition Assessments - 01/14/17 1100      MEDFICTS Scores   Pre Score 119      Nutrition Goals Re-Evaluation:     Nutrition Goals Re-Evaluation    Row Name 02/08/17 0823 03/08/17 0851           Goals   Current Weight 269 lb (122 kg)  -      Nutrition Goal  - Heart Healthy Diet       Comment [name] feels like he doesn't need to see the dietician.  TeClare Gandyontinues to decline the nutrition appointments.  He has been eating better, but admits to cheating some on his diet.  He has been sticking to lower sodium and using more pepper.      Expected Outcome  - Short: Cut out snacking at night.  Long: Continue with heart healthy diet.         Nutrition Goals Discharge (Final Nutrition Goals Re-Evaluation):     Nutrition Goals Re-Evaluation - 03/08/17 0851      Goals   Nutrition Goal Heart Healthy Diet   Comment TeClare Gandyontinues to decline the nutrition appointments.  He has been eating better, but admits to cheating some on his diet.  He has been sticking to lower sodium and using more pepper.   Expected Outcome Short: Cut out snacking at night.  Long: Continue with heart healthy diet.      Psychosocial: Target Goals: Acknowledge presence or absence of significant depression and/or stress, maximize coping skills, provide positive support system. Participant is able to verbalize types and ability to use techniques and skills needed for reducing stress and depression.   Initial Review & Psychosocial Screening:     Initial Psych Review & Screening - 01/14/17 1055      Initial Review   Current issues with Current Sleep Concerns     Screening Interventions   Interventions Encouraged to exercise      Quality of Life Scores:      Quality of Life - 01/14/17 1051      Quality of Life Scores   Health/Function Pre 24.75 %   Socioeconomic Pre 30 %   Psych/Spiritual Pre 28.57 %   Family Pre 25.5 %   GLOBAL Pre 26.69 %      PHQ-9: Recent Review Flowsheet Data    [facility] 01/[facility]   Decreased Interest 0   Down, Depressed, Hopeless 0   PHQ - 2 Score 0   Altered sleeping 0   Tired, decreased energy 3   Change in appetite 0   Feeling bad or failure about yourself  0   Trouble  concentrating 0   Moving slowly or fidgety/restless 0   Suicidal thoughts 0    PHQ-9 Score 3   Difficult doing work/chores Not difficult at all     Interpretation of Total Score  Total Score Depression Severity:  1-4 = Minimal depression, 5-9 = Mild depression, 10-14 = Moderate depression, 15-19 = Moderately severe depression, 20-27 = Severe depression   Psychosocial Evaluation and Intervention:     Psychosocial Evaluation - 02/08/17 0829      Psychosocial Evaluation & Interventions   Comments [name] just said "I don't sleep more than an hour so I am not going to mess with any sleep apnea machines. I sleep in a recliner still. [name] is still helping his wife who is on oxgyen but he says she cooks  a little then sits since she is short of breath but won't do Pulm Rehab.       Psychosocial Re-Evaluation:     Psychosocial Re-Evaluation    [place] Name 02/08/17 [phone] 03/08/17 0854           Psychosocial Re-Evaluation   Current issues with  - Current Sleep Concerns;Current Stress Concerns      Comments see info under Evaluation since I typed it in there . Sleep is the same. Stress with his wife is the same.  [name] feels that he has been doing well mentally.  Mood has been stable.  He tries to take things just as they come since his surgery trying not to let anything get to him too much.  Sleep continues to evade him, but this has been a long term issue.  He feels that coming to the program has been very beneficial and he has gotten a lot from it.      Expected Outcomes  - Short: Contiue to maintain positive outlook and stable mood.  Long: Continue to work on improving his sleep quality.      Interventions Encouraged to attend Cardiac Rehabilitation for the exercise Stress management education;Encouraged to attend Cardiac Rehabilitation for the exercise      Continue Psychosocial Services  Follow up required by staff Follow up required by staff        Initial Review   Source of Stress Concerns  - Chronic Illness         Psychosocial Discharge (Final Psychosocial  Re-Evaluation):     Psychosocial Re-Evaluation - 03/08/17 0854      Psychosocial Re-Evaluation   Current issues with Current Sleep Concerns;Current Stress Concerns   Comments [name] feels that he has been doing well mentally.  Mood has been stable.  He tries to take things just as they come since his surgery trying not to let anything get to him too much.  Sleep continues to evade him, but this has been a long term issue.  He feels that coming to the program has been very beneficial and he has gotten a lot from it.   Expected Outcomes Short: Contiue to maintain positive outlook and stable mood.  Long: Continue to work on improving his sleep quality.   Interventions Stress management education;Encouraged to attend Cardiac Rehabilitation for the exercise   Continue Psychosocial Services  Follow up required by staff     Initial Review   Source of Stress Concerns Chronic Illness      Vocational Rehabilitation: Provide vocational rehab assistance to qualifying candidates.   Vocational Rehab Evaluation & Intervention:     Vocational Rehab - 01/14/17 1053      Initial Vocational Rehab  Evaluation & Intervention   Assessment shows need for Vocational Rehabilitation No      Education: Education Goals: Education classes will be provided on a weekly basis, covering required topics. Participant will state understanding/return demonstration of topics presented.  Learning Barriers/Preferences:     Learning Barriers/Preferences - 01/14/17 1053      Learning Barriers/Preferences   Learning Barriers None   Learning Preferences Individual Instruction      Education Topics: General Nutrition Guidelines/Fats and Fiber: -Group instruction provided by verbal, written material, models and posters to present the general guidelines for heart healthy nutrition. Gives an explanation and review of dietary fats and fiber.   Cardiac Rehab from 03/20/2017 in [facility] Cardiac and Pulmonary Rehab  Date   02/04/17  Educator  CR  Instruction Review Code  2- meets goals/outcomes      Controlling Sodium/Reading Food Labels: -Group verbal and written material supporting the discussion of sodium use in heart healthy nutrition. Review and explanation with models, verbal and written materials for utilization of the food label.   Exercise Physiology & Risk Factors: - Group verbal and written instruction with models to review the exercise physiology of the cardiovascular system and associated critical values. Details cardiovascular disease risk factors and the goals associated with each risk factor.   Cardiac Rehab from 03/20/2017 in [facility] Cardiac and Pulmonary Rehab  Date  02/18/17  Educator  [facility]  Instruction Review Code  2- meets goals/outcomes      Aerobic Exercise & Resistance Training: - Gives group verbal and written discussion on the health impact of inactivity. On the components of aerobic and resistive training programs and the benefits of this training and how to safely progress through these programs.   Cardiac Rehab from 03/20/2017 in [facility] Cardiac and Pulmonary Rehab  Date  02/20/17  Educator  SB  Instruction Review Code  2- meets goals/outcomes      Flexibility, Balance, General Exercise Guidelines: - Provides group verbal and written instruction on the benefits of flexibility and balance training programs. Provides general exercise guidelines with specific guidelines to those with heart or lung disease. Demonstration and skill practice provided.   Stress Management: - Provides group verbal and written instruction about the health risks of elevated stress, cause of high stress, and healthy ways to reduce stress.   Depression: - Provides group verbal and written instruction on the correlation between heart/lung disease and depressed mood, treatment options, and the stigmas associated with seeking treatment.   Anatomy & Physiology of the Heart: - Group verbal and written  instruction and models provide basic cardiac anatomy and physiology, with the coronary electrical and arterial systems. Review of: AMI, Angina, Valve disease, Heart Failure, Cardiac Arrhythmia, Pacemakers, and the ICD.   Cardiac Rehab from 03/20/2017 in [facility] Cardiac and Pulmonary Rehab  Date  03/04/17  Educator  CE  Instruction Review Code  2- meets goals/outcomes      Cardiac Procedures: - Group verbal and written instruction and models to describe the testing methods done to diagnose heart disease. Reviews the outcomes of the test results. Describes the treatment choices: Medical Management, Angioplasty, or Coronary Bypass Surgery.   Cardiac Rehab from 03/20/2017 in [facility] Cardiac and Pulmonary Rehab  Date  03/11/17  Educator  CE  Instruction Review Code  2- meets goals/outcomes      Cardiac Medications: - Group verbal and written instruction to review commonly prescribed medications for heart disease. Reviews the medication, class of the drug, and side effects. Includes the steps  to properly store meds and maintain the prescription regimen.   Cardiac Rehab from 03/20/2017 in [facility] Cardiac and Pulmonary Rehab  Date  03/18/17 [03/18/17 Part 1 8//8/18 Part 2]  Educator  CE  Instruction Review Code  2- meets goals/outcomes      Go Sex-Intimacy & Heart Disease, Get SMART - Goal Setting: - Group verbal and written instruction through game format to discuss heart disease and the return to sexual intimacy. Provides group verbal and written material to discuss and apply goal setting through the application of the S.M.A.R.T. Method.   Cardiac Rehab from 03/20/2017 in [facility] Cardiac and Pulmonary Rehab  Date  03/11/17  Educator  CE  Instruction Review Code  2- meets goals/outcomes      Other Matters of the Heart: - Provides group verbal, written materials and models to describe Heart Failure, Angina, Valve Disease, and Diabetes in the realm of heart disease. Includes description of the disease process  and treatment options available to the cardiac patient.   Cardiac Rehab from 03/20/2017 in [facility] Cardiac and Pulmonary Rehab  Date  03/04/17  Educator  CE  Instruction Review Code  2- meets goals/outcomes      Exercise & Equipment Safety: - Individual verbal instruction and demonstration of equipment use and safety with use of the equipment.   Cardiac Rehab from 03/20/2017 in [facility] Cardiac and Pulmonary Rehab  Date  01/14/17  Educator  C. EnterkinRN  Instruction Review Code  1- partially meets, needs review/practice      Infection Prevention: - Provides verbal and written material to individual with discussion of infection control including proper hand washing and proper equipment cleaning during exercise session.   Cardiac Rehab from 03/20/2017 in [facility] Cardiac and Pulmonary Rehab  Date  01/14/17  Educator  C.EnterkinRN  Instruction Review Code  2- meets goals/outcomes      Falls Prevention: - Provides verbal and written material to individual with discussion of falls prevention and safety.   Cardiac Rehab from 03/20/2017 in [facility] Cardiac and Pulmonary Rehab  Date  01/14/17  Educator  C. [place]  Instruction Review Code  2- meets goals/outcomes      Diabetes: - Individual verbal and written instruction to review signs/symptoms of diabetes, desired ranges of glucose level fasting, after meals and with exercise. Advice that pre and post exercise glucose checks will be done for 3 sessions at entry of program.   Cardiac Rehab from 03/20/2017 in [facility] Cardiac and Pulmonary Rehab  Date  01/14/17  Educator  C. Enterkin, RN  Instruction Review Code  1- partially meets, needs review/practice       Knowledge Questionnaire Score:     Knowledge Questionnaire Score - 01/14/17 1215      Knowledge Questionnaire Score   Pre Score 1      Core Components/Risk Factors/Patient Goals at Admission:     Personal Goals and Risk Factors at Admission - 01/14/17 1217      Core Components/Risk  Factors/Patient Goals on Admission    Weight Management Yes;Obesity   Intervention Weight Management: Develop a combined nutrition and exercise program designed to reach desired caloric intake, while maintaining appropriate intake of nutrient and fiber, sodium and fats, and appropriate energy expenditure required for the weight goal.;Weight Management: Provide education and appropriate resources to help participant work on and attain dietary goals.;Weight Management/Obesity: Establish reasonable short term and long term weight goals.;Obesity: Provide education and appropriate resources to help participant work on and attain dietary goals.   Admit Weight  273 lb 14.4 oz (124.2 kg)   Goal Weight: Short Term 270 lb (122.5 kg)   Goal Weight: Long Term 260 lb (117.9 kg)   Expected Outcomes Short Term: Continue to assess and modify interventions until short term weight is achieved;Weight Maintenance: Understanding of the daily nutrition guidelines, which includes 25-35% calories from fat, 7% or less cal from saturated fats, less than 255m cholesterol, less than 1.5gm of sodium, & 5 or more servings of fruits and vegetables daily;Understanding recommendations for meals to include 15-35% energy as protein, 25-35% energy from fat, 35-60% energy from carbohydrates, less than 2046mof dietary cholesterol, 20-35 gm of total fiber daily;Long Term: Adherence to nutrition and physical activity/exercise program aimed toward attainment of established weight goal;Weight Loss: Understanding of general recommendations for a balanced deficit meal plan, which promotes 1-2 lb weight loss per week and includes a negative energy balance of [phone] kcal/d;Understanding of distribution of calorie intake throughout the day with the consumption of 4-5 meals/snacks   Diabetes Yes   Intervention Provide education about signs/symptoms and action to take for hypo/hyperglycemia.;Provide education about proper nutrition, including hydration,  and aerobic/resistive exercise prescription along with prescribed medications to achieve blood glucose in normal ranges: Fasting glucose 65-99 mg/dL   Expected Outcomes Short Term: Participant verbalizes understanding of the signs/symptoms and immediate care of hyper/hypoglycemia, proper foot care and importance of medication, aerobic/resistive exercise and nutrition plan for blood glucose control.;Long Term: Attainment of HbA1C < 7%.   Hypertension Yes   Intervention Provide education on lifestyle modifcations including regular physical activity/exercise, weight management, moderate sodium restriction and increased consumption of fresh fruit, vegetables, and low fat dairy, alcohol moderation, and smoking cessation.;Monitor prescription use compliance.   Expected Outcomes Short Term: Continued assessment and intervention until BP is < 140/9073mG in hypertensive participants. < 130/42m44m in hypertensive participants with diabetes, heart failure or chronic kidney disease.;Long Term: Maintenance of blood pressure at goal levels.   Lipids Yes   Intervention Provide education and support for participant on nutrition & aerobic/resistive exercise along with prescribed medications to achieve LDL <70mg47mL >40mg.36mxpected Outcomes Short Term: Participant states understanding of desired cholesterol values and is compliant with medications prescribed. Participant is following exercise prescription and nutrition guidelines.;Long Term: Cholesterol controlled with medications as prescribed, with individualized exercise RX and with personalized nutrition plan. Value goals: LDL < 70mg, 3m> 40 mg.      Core Components/Risk Factors/Patient Goals Review:      Goals and Risk Factor Review    Row Name 02/08/17 0824 02/08/17 0826 02/08/17 0827 03/08/17 0843       Core Components/Risk Factors/Patient Goals Review   Personal Goals Review Weight Management/Obesity Weight Management/Obesity  Diabetes;Hypertension;Stress Diabetes;Hypertension;Weight Management/Obesity;Lipids    Review 269lbs today. Stress still with his wife "she said I have to do it and order her a dress or clothes since she has gained alot of weight due to her illness". [name] repClare Gandys today is the first day that he has felt better since the MD increased his Benicar on Monday and he finally doesn't have a headache. [name] gotClare Gandynew blood pressure machine since he had his last one for 18 years and it wasn't correlating with us [place] it manually.  [name] hasClare Gandyst 4lbs.  [name]'s blood pressure and he is feeling better since MD increased his Benicar on Monday. [name] repClare Gandys he finally doesn't have a headache. He helps take care of his wife who takes a few bites then is  so short of breath even on oxgyen that she has to rest in between bites of food.  [name] has been doing well in rehab.  His weight has been pretty steady.  He would like to lose more weight and we talked about increasing his cardio at home to help.  [name]'s blood pressures have been getting better. He is still getting high readings at home on his new cuff.  He is going to bring in his cuff next week to compare against ours.  [name] has been disappointed in his blood sugars recently as they have been running above 200 mg/dl.  He admits to snacking and not eating as well as he should that may be playing a part in it.  He is doing well with his medications.    Expected Outcomes  -  - Heart healthy lifestyle. Feeling better since MD increased his Benicar-to cont this.  Short: Continue to keep a close eye on his blood pressures and blood sugars.  Long: Continue to work on risk factor modifications.       Core Components/Risk Factors/Patient Goals at Discharge (Final Review):      Goals and Risk Factor Review - 03/08/17 0843      Core Components/Risk Factors/Patient Goals Review   Personal Goals Review Diabetes;Hypertension;Weight Management/Obesity;Lipids   Review [name] has been doing well  in rehab.  His weight has been pretty steady.  He would like to lose more weight and we talked about increasing his cardio at home to help.  [name]'s blood pressures have been getting better. He is still getting high readings at home on his new cuff.  He is going to bring in his cuff next week to compare against ours.  [name] has been disappointed in his blood sugars recently as they have been running above 200 mg/dl.  He admits to snacking and not eating as well as he should that may be playing a part in it.  He is doing well with his medications.   Expected Outcomes Short: Continue to keep a close eye on his blood pressures and blood sugars.  Long: Continue to work on risk factor modifications.      ITP Comments:     ITP Comments    Row Name 01/14/17 1218 01/30/17 6734 01/30/17 0634 02/01/17 0943 02/11/17 0829   ITP Comments ITP created during Medical review after Cardiac Rehab informed consent was signed by Cindi Carbon" Sesma. Documentation of Diagnosis CHL/EPIC 30 day review. Continue with ITP unless directed changes per Medical Director review   New to program 30 day review. Continue with ITP unless directed changes per Medical Director review   New to program When [name] came in today, his blood pressure was 190/96.  He sat for awhile and recovered to 174/76.  He was able to exercise and came down throughout exercise.  After exercise he was down to 126/74.  [name] arrived today in Cardiac REhab with a high blood pressure after he reported that [place] increased his benicar but his blood pressure was still up all weekend.    [place] Name 02/11/17 [phone] 02/11/17 1634 02/27/17 0624 02/27/17 0625 03/22/17 0854   ITP Comments Taken to [facility] since [place] said he has been worried all weekend about his blood pressure which has been high.  [name] was discharged and added amlodipine 5 mg daily 30 day review. Continue with ITP unless directed changes per Medical Director review    30 day review. Continue with ITP  unless directed changes  per Medical Director review    Patient has some angina today with a 4/10 pain. He took a nitro tablet that brought his blood pressure down to 158/82 from 182/98. He completed the session today and states he has no chest pain after the nitro tablet. [name] states that if varies day to day with his angina.    [place] Name 03/27/17 0556           ITP Comments 30 day review. Continue with ITP unless directed changes per Medical Director review           Comments:

## 2017-03-29 ENCOUNTER — Encounter: Payer: Medicare Other | Admitting: *Deleted

## 2017-03-29 DIAGNOSIS — Z9861 Coronary angioplasty status: Secondary | ICD-10-CM | POA: Diagnosis not present

## 2017-03-29 DIAGNOSIS — Z951 Presence of aortocoronary bypass graft: Secondary | ICD-10-CM

## 2017-03-29 NOTE — Progress Notes (Signed)
Daily Session Note  Patient Details  Name: LINWOOD GULLIKSON MRN: [id number] Date of Birth: Sep 18, 1945 Referring Provider:     Cardiac Rehab from 01/14/2017 in [facility] Cardiac and Pulmonary Rehab  Referring Provider  Barrie Lyme MD      Encounter Date: 03/29/2017  Check In:     Session Check In - 03/29/17 0749      Check-In   Location [facility]-Cardiac & Pulmonary Rehab   Staff Present Gerlene Burdock, RN, Geralyn Corwin, RN Vickki Hearing, BA, ACSM CEP, Exercise Physiologist   Supervising physician immediately available to respond to emergencies See telemetry face sheet for immediately available ER MD   Medication changes reported     No   Fall or balance concerns reported    No   Tobacco Cessation No Change   Warm-up and Cool-down Performed on first and last piece of equipment   Resistance Training Performed Yes   VAD Patient? No     Pain Assessment   Currently in Pain? No/denies   Multiple Pain Sites No         History  Smoking Status  . Never Smoker  Smokeless Tobacco  . Never Used    Goals Met:  Independence with exercise equipment Exercise tolerated well No report of cardiac concerns or symptoms Strength training completed today  Goals Unmet:  Not Applicable  Comments: Pt able to follow exercise prescription today without complaint.  Will continue to monitor for progression.    Dr. Emily Filbert is Medical Director for [facility] and [name].

## 2017-04-01 ENCOUNTER — Encounter: Payer: Medicare Other | Admitting: *Deleted

## 2017-04-01 DIAGNOSIS — Z9861 Coronary angioplasty status: Secondary | ICD-10-CM

## 2017-04-01 DIAGNOSIS — Z951 Presence of aortocoronary bypass graft: Secondary | ICD-10-CM

## 2017-04-01 NOTE — Patient Instructions (Signed)
Discharge Instructions  Patient Details  Name: [name] MRN: [id number] Date of Birth: [birth year] Referring Provider:  Yolonda Kida, MD   Number of Visits: 36/36  Reason for Discharge:  Patient reached a stable level of exercise. Patient independent in their exercise.  Smoking History:  History  Smoking Status  . Never Smoker  Smokeless Tobacco  . Never Used    Diagnosis:  S/P CABG x 3  S/P PTCA (percutaneous transluminal coronary angioplasty)  Initial Exercise Prescription:     Initial Exercise Prescription - 01/14/17 1400      Date of Initial Exercise RX and Referring Provider   Date 01/14/17   Referring Provider Barrie Lyme MD     Treadmill   MPH 1.8   Grade 0   Minutes 15   METs 2.38     Recumbant Bike   Level 1   RPM 50   Watts 22   Minutes 15   METs 2.5     NuStep   Level 2   SPM 80   Minutes 15   METs 2     Prescription Details   Frequency (times per week) 3   Duration Progress to 45 minutes of aerobic exercise without signs/symptoms of physical distress     Intensity   THRR 40-80% of Max Heartrate 110-137   Ratings of Perceived Exertion 11-13   Perceived Dyspnea 0-4     Progression   Progression Continue to progress workloads to maintain intensity without signs/symptoms of physical distress.     Resistance Training   Training Prescription Yes   Weight 3 lbs   Reps 10-15      Discharge Exercise Prescription (Final Exercise Prescription Changes):     Exercise Prescription Changes - 03/22/17 0800      Response to Exercise   Blood Pressure (Admit) 162/76   Blood Pressure (Exercise) 212/84  rck 172/80   Blood Pressure (Exit) 138/70   Heart Rate (Admit) 68 bpm   Heart Rate (Exercise) 91 bpm   Heart Rate (Exit) 67 bpm   Rating of Perceived Exertion (Exercise) 14   Symptoms chest pain and htn   Duration Continue with 45 min of aerobic exercise without signs/symptoms of physical distress.   Intensity THRR  unchanged     Progression   Progression Continue to progress workloads to maintain intensity without signs/symptoms of physical distress.   Average METs 3.81     Resistance Training   Training Prescription Yes   Weight 3 lbs   Reps 10-15     Interval Training   Interval Training No     Recumbant Bike   Level 9   Watts 43   Minutes 15   METs 3.13     NuStep   Level 6   Minutes 15   METs 2.6     REL-XR   Level 11   Minutes 15   METs 5.7     Home Exercise Plan   Plans to continue exercise at Home (comment)  walking   Frequency Add 2 additional days to program exercise sessions.   Initial Home Exercises Provided 01/30/17      Functional Capacity:     6 Minute Walk    Row Name 01/14/17 1409 04/01/17 0832       6 Minute Walk   Phase Initial Discharge    Distance 1040 feet 1260 feet    Distance % Change  - 21 %  220 ft    Walk Time 6  minutes 6 minutes    # of Rest Breaks 0 0    MPH 1.97 2.39    METS 2.55 2.89    RPE 13 13    VO2 Peak 8.91 10.11    Symptoms No No    Resting HR 72 bpm 66 bpm    Resting BP 178/100  recheck 164/84 134/66    Max Ex. HR 101 bpm 109 bpm    Max Ex. BP 196/86 172/70    2 Minute Post BP 172/86  -       Quality of Life:     Quality of Life - 04/01/17 0835      Quality of Life Scores   Health/Function Pre 24.75 %   Health/Function Post 22.53 %   Health/Function % Change -8.97 %   Socioeconomic Pre 30 %   Socioeconomic Post 28.75 %   Socioeconomic % Change  -4.17 %   Psych/Spiritual Pre 28.57 %   Psych/Spiritual Post 23.57 %   Psych/Spiritual % Change -17.5 %   Family Pre 25.5 %   Family Post 22.9 %   Family % Change -10.2 %   GLOBAL Pre 26.69 %   GLOBAL Post 23.94 %   GLOBAL % Change -10.3 %      Personal Goals: Goals established at orientation with interventions provided to work toward goal.     Personal Goals and Risk Factors at Admission - 01/14/17 1217      Core Components/Risk Factors/Patient Goals on  Admission    Weight Management Yes;Obesity   Intervention Weight Management: Develop a combined nutrition and exercise program designed to reach desired caloric intake, while maintaining appropriate intake of nutrient and fiber, sodium and fats, and appropriate energy expenditure required for the weight goal.;Weight Management: Provide education and appropriate resources to help participant work on and attain dietary goals.;Weight Management/Obesity: Establish reasonable short term and long term weight goals.;Obesity: Provide education and appropriate resources to help participant work on and attain dietary goals.   Admit Weight 273 lb 14.4 oz (124.2 kg)   Goal Weight: Short Term 270 lb (122.5 kg)   Goal Weight: Long Term 260 lb (117.9 kg)   Expected Outcomes Short Term: Continue to assess and modify interventions until short term weight is achieved;Weight Maintenance: Understanding of the daily nutrition guidelines, which includes 25-35% calories from fat, 7% or less cal from saturated fats, less than 200mg  cholesterol, less than 1.5gm of sodium, & 5 or more servings of fruits and vegetables daily;Understanding recommendations for meals to include 15-35% energy as protein, 25-35% energy from fat, 35-60% energy from carbohydrates, less than 200mg  of dietary cholesterol, 20-35 gm of total fiber daily;Long Term: Adherence to nutrition and physical activity/exercise program aimed toward attainment of established weight goal;Weight Loss: Understanding of general recommendations for a balanced deficit meal plan, which promotes 1-2 lb weight loss per week and includes a negative energy balance of [phone] kcal/d;Understanding of distribution of calorie intake throughout the day with the consumption of 4-5 meals/snacks   Diabetes Yes   Intervention Provide education about signs/symptoms and action to take for hypo/hyperglycemia.;Provide education about proper nutrition, including hydration, and aerobic/resistive  exercise prescription along with prescribed medications to achieve blood glucose in normal ranges: Fasting glucose 65-99 mg/dL   Expected Outcomes Short Term: Participant verbalizes understanding of the signs/symptoms and immediate care of hyper/hypoglycemia, proper foot care and importance of medication, aerobic/resistive exercise and nutrition plan for blood glucose control.;Long Term: Attainment of HbA1C < 7%.   Hypertension Yes  Intervention Provide education on lifestyle modifcations including regular physical activity/exercise, weight management, moderate sodium restriction and increased consumption of fresh fruit, vegetables, and low fat dairy, alcohol moderation, and smoking cessation.;Monitor prescription use compliance.   Expected Outcomes Short Term: Continued assessment and intervention until BP is < 140/71mm HG in hypertensive participants. < 130/24mm HG in hypertensive participants with diabetes, heart failure or chronic kidney disease.;Long Term: Maintenance of blood pressure at goal levels.   Lipids Yes   Intervention Provide education and support for participant on nutrition & aerobic/resistive exercise along with prescribed medications to achieve LDL 70mg , HDL >40mg .   Expected Outcomes Short Term: Participant states understanding of desired cholesterol values and is compliant with medications prescribed. Participant is following exercise prescription and nutrition guidelines.;Long Term: Cholesterol controlled with medications as prescribed, with individualized exercise RX and with personalized nutrition plan. Value goals: LDL < 70mg , HDL > 40 mg.       Personal Goals Discharge:     Goals and Risk Factor Review - 03/08/17 0843      Core Components/Risk Factors/Patient Goals Review   Personal Goals Review Diabetes;Hypertension;Weight Management/Obesity;Lipids   Review [name] has been doing well in rehab.  His weight has been pretty steady.  He would like to lose more weight and we  talked about increasing his cardio at home to help.  [name]'s blood pressures have been getting better. He is still getting high readings at home on his new cuff.  He is going to bring in his cuff next week to compare against ours.  [name] has been disappointed in his blood sugars recently as they have been running above 200 mg/dl.  He admits to snacking and not eating as well as he should that may be playing a part in it.  He is doing well with his medications.   Expected Outcomes Short: Continue to keep a close eye on his blood pressures and blood sugars.  Long: Continue to work on risk factor modifications.      Nutrition & Weight - Outcomes:     Pre Biometrics - 01/14/17 1413      Pre Biometrics   Height 6' 1.8" (1.875 m)   Weight 273 lb 14.4 oz (124.2 kg)   Waist Circumference 50 inches   Hip Circumference 47 inches   Waist to Hip Ratio 1.06 %   BMI (Calculated) 35.4   Single Leg Stand 9.59 seconds       Nutrition:     Nutrition Therapy & Goals - 03/08/17 0850      Nutrition Therapy   RD appointment defered Yes      Nutrition Discharge:     Nutrition Assessments - 04/01/17 0833      MEDFICTS Scores   Pre Score 119   Post Score 74   Score Difference -45      Education Questionnaire Score:     Knowledge Questionnaire Score - 04/01/17 0833      Knowledge Questionnaire Score   Pre Score 1/28   Post Score 24/28      Goals reviewed with patient; copy given to patient.

## 2017-04-01 NOTE — Progress Notes (Signed)
Daily Session Note  Patient Details  Name: [name] MRN: [id number] Date of Birth: 05/25/1946 Referring Provider:     Cardiac Rehab from 01/14/2017 in [facility] Cardiac and Pulmonary Rehab  Referring Provider  [place], Bruce MD      Encounter Date: 04/01/2017  Check In:     Session Check In - 04/01/17 0830      Check-In   Location [facility]-Cardiac & Pulmonary Rehab   Staff Present Jessica Hawkins, MA, ACSM RCEP, Exercise Physiologist;Kelly Hayes, BS, ACSM CEP, Exercise Physiologist;Carroll Enterkin, RN, BSN   Supervising physician immediately available to respond to emergencies See telemetry face sheet for immediately available ER MD   Medication changes reported     No   Fall or balance concerns reported    No   Warm-up and Cool-down Performed on first and last piece of equipment   Resistance Training Performed Yes   VAD Patient? No     Pain Assessment   Currently in Pain? No/denies   Multiple Pain Sites No         History  Smoking Status  . Never Smoker  Smokeless Tobacco  . Never Used    Goals Met:  Independence with exercise equipment Exercise tolerated well No report of cardiac concerns or symptoms Strength training completed today  Goals Unmet:  Not Applicable  Comments: Pt able to follow exercise prescription today without complaint.  Will continue to monitor for progression.    Dr. Mark Miller is Medical Director for [facility] and [name]. 

## 2017-04-03 DIAGNOSIS — Z9861 Coronary angioplasty status: Secondary | ICD-10-CM

## 2017-04-03 DIAGNOSIS — Z951 Presence of aortocoronary bypass graft: Secondary | ICD-10-CM

## 2017-04-03 NOTE — Progress Notes (Signed)
Cardiac Individual Treatment Plan  Patient Details  Name: [name] MRN: [id number] Date of Birth: Jul 09, 1946 Referring Provider:     Cardiac Rehab from 01/14/2017 in [facility] Cardiac and Pulmonary Rehab  Referring Provider  Barrie Lyme MD      Initial Encounter Date:    Cardiac Rehab from 01/14/2017 in [facility] Cardiac and Pulmonary Rehab  Date  01/14/17  Referring Provider  Barrie Lyme MD      Visit Diagnosis: S/P CABG x 3  S/P PTCA (percutaneous transluminal coronary angioplasty)  Patient's Home Medications on Admission:  Current Outpatient Prescriptions:  .  amLODipine (NORVASC) 5 MG tablet, Take 1 tablet (5 mg total) by mouth daily., Disp: 30 tablet, Rfl: 0 .  aspirin EC 81 MG tablet, Take 81 mg by mouth daily., Disp: , Rfl:  .  clopidogrel (PLAVIX) 75 MG tablet, Take by mouth daily. , Disp: , Rfl:  .  insulin glargine (LANTUS) 100 UNIT/ML injection, Inject 10 Units into the skin at bedtime., Disp: , Rfl:  .  metFORMIN (GLUCOPHAGE) 1000 MG tablet, Take 500 mg by mouth 2 (two) times daily with a meal. , Disp: , Rfl:  .  nebivolol (BYSTOLIC) 10 MG tablet, Take 10 mg by mouth daily. , Disp: , Rfl:  .  olmesartan (BENICAR) 5 MG tablet, Take 10 mg by mouth daily. , Disp: , Rfl:  .  omeprazole (PRILOSEC) 40 MG capsule, Take 40 mg by mouth daily., Disp: , Rfl:  .  rosuvastatin (CRESTOR) 20 MG tablet, Take 20 mg by mouth daily., Disp: , Rfl:  .  sitaGLIPtin (JANUVIA) 100 MG tablet, Take 100 mg by mouth daily., Disp: , Rfl:   Past Medical History: Past Medical History:  Diagnosis Date  . Arthritis   . Coronary artery disease   . Diabetes mellitus without complication ([place])   . GERD (gastroesophageal reflux disease)   . Sleep apnea     Tobacco Use: History  Smoking Status  . Never Smoker  Smokeless Tobacco  . Never Used    Labs: Recent Review Flowsheet Data    There is no flowsheet data to display.       Exercise Target Goals:    Exercise Program  Goal: Individual exercise prescription set with THRR, safety & activity barriers. Participant demonstrates ability to understand and report RPE using BORG scale, to self-measure pulse accurately, and to acknowledge the importance of the exercise prescription.  Exercise Prescription Goal: Starting with aerobic activity 30 plus minutes a day, 3 days per week for initial exercise prescription. Provide home exercise prescription and guidelines that participant acknowledges understanding prior to discharge.  Activity Barriers & Risk Stratification:     Activity Barriers & Cardiac Risk Stratification - 01/14/17 1216      Activity Barriers & Cardiac Risk Stratification   Activity Barriers Deconditioning;Muscular Weakness;Chest Pain/Angina;Arthritis  arthritis in r knee   Cardiac Risk Stratification High      6 Minute Walk:     6 Minute Walk    Row Name 01/14/17 1409 04/01/17 0832       6 Minute Walk   Phase Initial Discharge    Distance 1040 feet 1260 feet    Distance % Change  - 21 %  220 ft    Walk Time 6 minutes 6 minutes    # of Rest Breaks 0 0    MPH 1.97 2.39    METS 2.55 2.89    RPE 13 13    VO2 Peak 8.91 10.11  Symptoms No No    Resting HR 72 bpm 66 bpm    Resting BP 178/100  recheck 164/84 134/66    Max Ex. HR 101 bpm 109 bpm    Max Ex. BP 196/86 172/70    2 Minute Post BP 172/86  -       Oxygen Initial Assessment:   Oxygen Re-Evaluation:   Oxygen Discharge (Final Oxygen Re-Evaluation):   Initial Exercise Prescription:     Initial Exercise Prescription - 01/14/17 1400      Date of Initial Exercise RX and Referring Provider   Date 01/14/17   Referring Provider Barrie Lyme MD     Treadmill   MPH 1.8   Grade 0   Minutes 15   METs 2.38     Recumbant Bike   Level 1   RPM 50   Watts 22   Minutes 15   METs 2.5     NuStep   Level 2   SPM 80   Minutes 15   METs 2     Prescription Details   Frequency (times per week) 3   Duration  Progress to 45 minutes of aerobic exercise without signs/symptoms of physical distress     Intensity   THRR 40-80% of Max Heartrate 110-137   Ratings of Perceived Exertion 11-13   Perceived Dyspnea 0-4     Progression   Progression Continue to progress workloads to maintain intensity without signs/symptoms of physical distress.     Resistance Training   Training Prescription Yes   Weight 3 lbs   Reps 10-15      Perform Capillary Blood Glucose checks as needed.  Exercise Prescription Changes:     Exercise Prescription Changes    Row Name 01/14/17 1400 01/23/17 1400 01/30/17 0900 02/05/17 1100 02/19/17 1000     Response to Exercise   Blood Pressure (Admit) 178/100  recheck 164/84 126/64  - 132/60 160/80   Blood Pressure (Exercise) 196/86 168/84  - 158/92 192/88   Blood Pressure (Exit) 172/86 136/74  - 124/60 176/80   Heart Rate (Admit) 83 bpm 75 bpm  - 77 bpm 81 bpm   Heart Rate (Exercise) 101 bpm 117 bpm  - 97 bpm 93 bpm   Heart Rate (Exit) 80 bpm 79 bpm  - 67 bpm 80 bpm   Oxygen Saturation (Admit) 100 %  -  -  -  -   Oxygen Saturation (Exercise) 96 %  -  -  -  -   Rating of Perceived Exertion (Exercise) 13 13  - 13 13   Symptoms _0    Comments walk test results  -  -  -  -   Duration  - Progress to 45 minutes of aerobic exercise without signs/symptoms of physical distress Progress to 45 minutes of aerobic exercise without signs/symptoms of physical distress Continue with 45 min of aerobic exercise without signs/symptoms of physical distress. Continue with 45 min of aerobic exercise without signs/symptoms of physical distress.   Intensity  - THRR unchanged THRR unchanged THRR unchanged THRR unchanged     Progression   Progression  - Continue to progress workloads to maintain intensity without signs/symptoms of physical distress. Continue to progress workloads to maintain intensity without signs/symptoms of physical distress. Continue to progress workloads  to maintain intensity without signs/symptoms of physical distress. Continue to progress workloads to maintain intensity without signs/symptoms of physical distress.   Average METs  - 2.45 2.45 2.46 2.65  Resistance Training   Training Prescription  - Yes Yes Yes Yes   Weight  - 3 lbs 3 lbs 3 lbs 3   Reps  - 10-15 10-15 10-15 10-15     Interval Training   Interval Training  - No No No No     Treadmill   MPH  - 1.8 1.8 1.8  -   Grade  - 0 0 0  -   Minutes  - _0 -   METs  - 2.38 2.38 2.38  -     Recumbant Bike   Level  - _1 -   Watts  - _2 -   Minutes  - _3 -   METs  - 2.66 2.66 2.5  -     NuStep   Level  - _4 Minutes  - _5 METs  - 1.9 1.9 2.5 3.8     REL-XR   Level  -  -  -  - 2   Minutes  -  -  -  - 30   METs  -  -  -  - 1.5     Home Exercise Plan   Plans to continue exercise at  -  - Home (comment)  walking Home (comment)  walking  -   Frequency  -  - Add 2 additional days to program exercise sessions. Add 2 additional days to program exercise sessions.  -   Initial Home Exercises Provided  -  - 01/30/17 01/30/17  -   [place] Name 03/06/17 1500 03/22/17 0800 04/02/17 1400         Response to Exercise   Blood Pressure (Admit) 136/74 162/76 134/66     Blood Pressure (Exercise) 162/82 212/84  rck 172/80 172/70     Blood Pressure (Exit) 162/88 138/70 126/60     Heart Rate (Admit) 75 bpm 68 bpm 66 bpm     Heart Rate (Exercise) 94 bpm 91 bpm 98 bpm     Heart Rate (Exit) 67 bpm 67 bpm 72 bpm     Rating of Perceived Exertion (Exercise) _6 Symptoms none chest pain and htn none     Duration Continue with 45 min of aerobic exercise without signs/symptoms of physical distress. Continue with 45 min of aerobic exercise without signs/symptoms of physical distress. Continue with 45 min of aerobic exercise without signs/symptoms of physical distress.     Intensity THRR unchanged THRR unchanged THRR unchanged       Progression    Progression Continue to progress workloads to maintain intensity without signs/symptoms of physical distress. Continue to progress workloads to maintain intensity without signs/symptoms of physical distress. Continue to progress workloads to maintain intensity without signs/symptoms of physical distress.     Average METs 3.35 3.81 3.74       Resistance Training   Training Prescription Yes Yes Yes     Weight 3 lbs 3 lbs 7 lbs     Reps 10-15 10-15 10-15       Interval Training   Interval Training No No No       Recumbant Bike   Level _7 Watts 29 43 43     Minutes _8 METs 2.76 3.13 3.13       NuStep   Level 6  6 9     Minutes _0 METs 3.7 2.6 2.6       REL-XR   Level _1 Minutes _2 METs 3.6 5.7 5.5       Home Exercise Plan   Plans to continue exercise at Home (comment)  walking Home (comment)  walking Home (comment)  walking     Frequency Add 2 additional days to program exercise sessions. Add 2 additional days to program exercise sessions. Add 2 additional days to program exercise sessions.     Initial Home Exercises Provided 01/30/17 01/30/17 01/30/17        Exercise Comments:     Exercise Comments    Row Name 01/16/17 0919 03/20/17 0848         Exercise Comments First full day of exercise!  Patient was oriented to gym and equipment including functions, settings, policies, and procedures.  Patient's individual exercise prescription and treatment plan were reviewed.  All starting workloads were established based on the results of the 6 minute walk test done at initial orientation visit.  The plan for exercise progression was also introduced and progression will be customized based on patient's performance and goals. [name]'s blood pressure was elevated on the XR today.  He was 212/84, workload was reduced and recheck was 172/80.  He was able to continue to exercise and stayed in the 170s.  He had started the day in the 160s.  He is  looking into getting a new blood pressure cuff for home use, as the one he has is reading him higher than what we are getting here.  He was able to cool down and return his pressure to normal.          Exercise Goals and Review:     Exercise Goals    Row Name 01/14/17 1413             Exercise Goals   Increase Physical Activity Yes       Intervention Provide advice, education, support and counseling about physical activity/exercise needs.;Develop an individualized exercise prescription for aerobic and resistive training based on initial evaluation findings, risk stratification, comorbidities and participant's personal goals.       Expected Outcomes Achievement of increased cardiorespiratory fitness and enhanced flexibility, muscular endurance and strength shown through measurements of functional capacity and personal statement of participant.       Increase Strength and Stamina Yes       Intervention Provide advice, education, support and counseling about physical activity/exercise needs.;Develop an individualized exercise prescription for aerobic and resistive training based on initial evaluation findings, risk stratification, comorbidities and participant's personal goals.       Expected Outcomes Achievement of increased cardiorespiratory fitness and enhanced flexibility, muscular endurance and strength shown through measurements of functional capacity and personal statement of participant.          Exercise Goals Re-Evaluation :     Exercise Goals Re-Evaluation    Row Name 01/23/17 1432 01/30/17 0932 02/05/17 1058 02/19/17 1020 03/06/17 1538     Exercise Goal Re-Evaluation   Exercise Goals Review Increase Strenth and Stamina;Increase Physical Activity Increase Strenth and Stamina;Increase Physical Activity Increase Strenth and Stamina;Increase Physical Activity Increase Physical Activity;Increase Strenth and Stamina Increase Physical Activity;Increase Strenth and Stamina   Comments  [name] is off to a good start in rehab.  He has completed three exercise sessions with [place] so far.  He is already up to level 5 on the NuStep.  We will continue to monitor his progress. Reviewed home exercise with pt today.  Pt plans to walk at home for exercise.  Reviewed THR, pulse, RPE, sign and symptoms, NTG use, and when to call 911 or MD.  Also discussed weather considerations and indoor options.  Pt voiced understanding. [name] has been doing well in rehab.  He is now using the XR and walking some as his foot is feeling a little better.  We will continue to monitor his progression.   [name] is progressing well with exercise and has increased MET level. [name] continues to do well in rehab.  His blood pressures have been doing better.  He is up to level 6 on everything.  We will continue to monitor his progression.   Expected Outcomes Short: Review home exercise.   Long: Work on Education administrator. Short: Add in at least one day of exercise at home.  Long: Make exercise part of routine. Short: Continue to walk some.  Long: Contine to exercise at home as well. [name] will continue to attend regularly and increase overall strength and stamina. Short: Continue to keep a close on eye on his blood pressures.   Long: Continue to exercise routinely.   [place] Name 03/08/17 [phone] 03/22/17 0834 04/01/17 0833 04/02/17 1353       Exercise Goal Re-Evaluation   Exercise Goals Review Increase Strenth and Stamina;Increase Physical Activity Increase Strenth and Stamina;Increase Physical Activity Increase Strenth and Stamina;Increase Physical Activity Increase Strenth and Stamina;Increase Physical Activity    Comments [name] has been getting stronger. He has really noticed a difference in his leg strength with all of the exercise.  He is doing some at home with strength and stretching.  We talked about increasing his cardio at home.    [name] has been doing well in rehab.  He does develop some chest pain during exercise from time to  time.  He is still hesistant to exercise more at home.  We will continue to monitor his progression.  Improved post 6MWT by 21%!! [name] is nearing graduation!!  He continues to have issues with elevated blood pressure, but he does have an appointment coming up to discuss better control.  We will continue to monitor his progress.    Expected Outcomes Short: Add in more cardio.  Long: Make exercise part of daily routine. Short: Add in more exercise at home.  Long: Make exercise part of his normal routine.   - Short: Graduate!!  Long: Continue to exercise by walking at home,.        Discharge Exercise Prescription (Final Exercise Prescription Changes):     Exercise Prescription Changes - 04/02/17 1400      Response to Exercise   Blood Pressure (Admit) 134/66   Blood Pressure (Exercise) 172/70   Blood Pressure (Exit) 126/60   Heart Rate (Admit) 66 bpm   Heart Rate (Exercise) 98 bpm   Heart Rate (Exit) 72 bpm   Rating of Perceived Exertion (Exercise) 13   Symptoms none   Duration Continue with 45 min of aerobic exercise without signs/symptoms of physical distress.   Intensity THRR unchanged     Progression   Progression Continue to progress workloads to maintain intensity without signs/symptoms of physical distress.   Average METs 3.74     Resistance Training   Training Prescription Yes   Weight 7 lbs   Reps 10-15     Interval Training  Interval Training No     Recumbant Bike   Level 3   Watts 43   Minutes 15   METs 3.13     NuStep   Level 9   Minutes 15   METs 2.6     REL-XR   Level 11   Minutes 15   METs 5.5     Home Exercise Plan   Plans to continue exercise at Home (comment)  walking   Frequency Add 2 additional days to program exercise sessions.   Initial Home Exercises Provided 01/30/17      Nutrition:  Target Goals: Understanding of nutrition guidelines, daily intake of sodium <1510m, cholesterol <2030m calories 30% from fat and 7% or less from saturated  fats, daily to have 5 or more servings of fruits and vegetables.  Biometrics:     Pre Biometrics - 01/14/17 1413      Pre Biometrics   Height 6' 1.8" (1.875 m)   Weight 273 lb 14.4 oz (124.2 kg)   Waist Circumference 50 inches   Hip Circumference 47 inches   Waist to Hip Ratio 1.06 %   BMI (Calculated) 35.4   Single Leg Stand 9.59 seconds       Nutrition Therapy Plan and Nutrition Goals:     Nutrition Therapy & Goals - 03/08/17 0850      Nutrition Therapy   RD appointment defered Yes      Nutrition Discharge: Rate Your Plate Scores:     Nutrition Assessments - 04/01/17 0833      MEDFICTS Scores   Pre Score 119   Post Score 74   Score Difference -45      Nutrition Goals Re-Evaluation:     Nutrition Goals Re-Evaluation    Row Name 02/08/17 0823 03/08/17 0851           Goals   Current Weight 269 lb (122 kg)  -      Nutrition Goal  - Heart Healthy Diet      Comment [name] feels like he doesn't need to see the dietician.  TeClare Gandyontinues to decline the nutrition appointments.  He has been eating better, but admits to cheating some on his diet.  He has been sticking to lower sodium and using more pepper.      Expected Outcome  - Short: Cut out snacking at night.  Long: Continue with heart healthy diet.         Nutrition Goals Discharge (Final Nutrition Goals Re-Evaluation):     Nutrition Goals Re-Evaluation - 03/08/17 0851      Goals   Nutrition Goal Heart Healthy Diet   Comment TeClare Gandyontinues to decline the nutrition appointments.  He has been eating better, but admits to cheating some on his diet.  He has been sticking to lower sodium and using more pepper.   Expected Outcome Short: Cut out snacking at night.  Long: Continue with heart healthy diet.      Psychosocial: Target Goals: Acknowledge presence or absence of significant depression and/or stress, maximize coping skills, provide positive support system. Participant is able to verbalize types and  ability to use techniques and skills needed for reducing stress and depression.   Initial Review & Psychosocial Screening:     Initial Psych Review & Screening - 01/14/17 1055      Initial Review   Current issues with Current Sleep Concerns     Screening Interventions   Interventions Encouraged to exercise      Quality of Life  Scores:      Quality of Life - 04/01/17 0835      Quality of Life Scores   Health/Function Pre 24.75 %   Health/Function Post 22.53 %   Health/Function % Change -8.97 %   Socioeconomic Pre 30 %   Socioeconomic Post 28.75 %   Socioeconomic % Change  -4.17 %   Psych/Spiritual Pre 28.57 %   Psych/Spiritual Post 23.57 %   Psych/Spiritual % Change -17.5 %   Family Pre 25.5 %   Family Post 22.9 %   Family % Change -10.2 %   GLOBAL Pre 26.69 %   GLOBAL Post 23.94 %   GLOBAL % Change -10.3 %      PHQ-9: Recent Review Flowsheet Data    Depression screen [facility] 2/9 04/01/2017 01/14/2017   Decreased Interest 0 0   Down, Depressed, Hopeless 0 0   PHQ - 2 Score 0 0   Altered sleeping 0 0   Tired, decreased energy 1 3   Change in appetite 0 0   Feeling bad or failure about yourself  0 0   Trouble concentrating 0 0   Moving slowly or fidgety/restless 0 0   Suicidal thoughts 0 0   PHQ-9 Score 1 3   Difficult doing work/chores Not difficult at all Not difficult at all     Interpretation of Total Score  Total Score Depression Severity:  1-4 = Minimal depression, 5-9 = Mild depression, 10-14 = Moderate depression, 15-19 = Moderately severe depression, 20-27 = Severe depression   Psychosocial Evaluation and Intervention:     Psychosocial Evaluation - 02/08/17 0829      Psychosocial Evaluation & Interventions   Comments [name] just said "I don't sleep more than an hour so I am not going to mess with any sleep apnea machines. I sleep in a recliner still. [name] is still helping his wife who is on oxgyen but he says she cooks  a little then sits since she is  short of breath but won't do Pulm Rehab.       Psychosocial Re-Evaluation:     Psychosocial Re-Evaluation    Row Name 02/08/17 [phone] 03/08/17 0854           Psychosocial Re-Evaluation   Current issues with  - Current Sleep Concerns;Current Stress Concerns      Comments see info under Evaluation since I typed it in there . Sleep is the same. Stress with his wife is the same.  [name] feels that he has been doing well mentally.  Mood has been stable.  He tries to take things just as they come since his surgery trying not to let anything get to him too much.  Sleep continues to evade him, but this has been a long term issue.  He feels that coming to the program has been very beneficial and he has gotten a lot from it.      Expected Outcomes  - Short: Contiue to maintain positive outlook and stable mood.  Long: Continue to work on improving his sleep quality.      Interventions Encouraged to attend Cardiac Rehabilitation for the exercise Stress management education;Encouraged to attend Cardiac Rehabilitation for the exercise      Continue Psychosocial Services  Follow up required by staff Follow up required by staff        Initial Review   Source of Stress Concerns  - Chronic Illness         Psychosocial Discharge (Final Psychosocial Re-Evaluation):  Psychosocial Re-Evaluation - 03/08/17 0854      Psychosocial Re-Evaluation   Current issues with Current Sleep Concerns;Current Stress Concerns   Comments [name] feels that he has been doing well mentally.  Mood has been stable.  He tries to take things just as they come since his surgery trying not to let anything get to him too much.  Sleep continues to evade him, but this has been a long term issue.  He feels that coming to the program has been very beneficial and he has gotten a lot from it.   Expected Outcomes Short: Contiue to maintain positive outlook and stable mood.  Long: Continue to work on improving his sleep quality.   Interventions  Stress management education;Encouraged to attend Cardiac Rehabilitation for the exercise   Continue Psychosocial Services  Follow up required by staff     Initial Review   Source of Stress Concerns Chronic Illness      Vocational Rehabilitation: Provide vocational rehab assistance to qualifying candidates.   Vocational Rehab Evaluation & Intervention:     Vocational Rehab - 01/14/17 1053      Initial Vocational Rehab Evaluation & Intervention   Assessment shows need for Vocational Rehabilitation No      Education: Education Goals: Education classes will be provided on a weekly basis, covering required topics. Participant will state understanding/return demonstration of topics presented.  Learning Barriers/Preferences:     Learning Barriers/Preferences - 01/14/17 1053      Learning Barriers/Preferences   Learning Barriers None   Learning Preferences Individual Instruction      Education Topics: General Nutrition Guidelines/Fats and Fiber: -Group instruction provided by verbal, written material, models and posters to present the general guidelines for heart healthy nutrition. Gives an explanation and review of dietary fats and fiber.   Cardiac Rehab from 04/03/2017 in [facility] Cardiac and Pulmonary Rehab  Date  04/01/17  Educator  CR  Instruction Review Code  2- meets goals/outcomes      Controlling Sodium/Reading Food Labels: -Group verbal and written material supporting the discussion of sodium use in heart healthy nutrition. Review and explanation with models, verbal and written materials for utilization of the food label.   Exercise Physiology & Risk Factors: - Group verbal and written instruction with models to review the exercise physiology of the cardiovascular system and associated critical values. Details cardiovascular disease risk factors and the goals associated with each risk factor.   Cardiac Rehab from 04/03/2017 in [facility] Cardiac and Pulmonary Rehab  Date   02/18/17  Educator  [facility]  Instruction Review Code  2- meets goals/outcomes      Aerobic Exercise & Resistance Training: - Gives group verbal and written discussion on the health impact of inactivity. On the components of aerobic and resistive training programs and the benefits of this training and how to safely progress through these programs.   Cardiac Rehab from 04/03/2017 in [facility] Cardiac and Pulmonary Rehab  Date  02/20/17  Educator  SB  Instruction Review Code  2- meets goals/outcomes      Flexibility, Balance, General Exercise Guidelines: - Provides group verbal and written instruction on the benefits of flexibility and balance training programs. Provides general exercise guidelines with specific guidelines to those with heart or lung disease. Demonstration and skill practice provided.   Stress Management: - Provides group verbal and written instruction about the health risks of elevated stress, cause of high stress, and healthy ways to reduce stress.   Depression: - Provides group verbal and written  instruction on the correlation between heart/lung disease and depressed mood, treatment options, and the stigmas associated with seeking treatment.   Cardiac Rehab from 04/03/2017 in [facility] Cardiac and Pulmonary Rehab  Date  04/03/17  Educator  [facility]  Instruction Review Code  2- meets goals/outcomes      Anatomy & Physiology of the Heart: - Group verbal and written instruction and models provide basic cardiac anatomy and physiology, with the coronary electrical and arterial systems. Review of: AMI, Angina, Valve disease, Heart Failure, Cardiac Arrhythmia, Pacemakers, and the ICD.   Cardiac Rehab from 04/03/2017 in [facility] Cardiac and Pulmonary Rehab  Date  03/04/17  Educator  CE  Instruction Review Code  2- meets goals/outcomes      Cardiac Procedures: - Group verbal and written instruction and models to describe the testing methods done to diagnose heart disease. Reviews the outcomes  of the test results. Describes the treatment choices: Medical Management, Angioplasty, or Coronary Bypass Surgery.   Cardiac Rehab from 04/03/2017 in [facility] Cardiac and Pulmonary Rehab  Date  03/11/17  Educator  CE  Instruction Review Code  2- meets goals/outcomes      Cardiac Medications: - Group verbal and written instruction to review commonly prescribed medications for heart disease. Reviews the medication, class of the drug, and side effects. Includes the steps to properly store meds and maintain the prescription regimen.   Cardiac Rehab from 04/03/2017 in [facility] Cardiac and Pulmonary Rehab  Date  03/18/17 [03/18/17 Part 1 8//8/18 Part 2]  Educator  CE  Instruction Review Code  2- meets goals/outcomes      Go Sex-Intimacy & Heart Disease, Get SMART - Goal Setting: - Group verbal and written instruction through game format to discuss heart disease and the return to sexual intimacy. Provides group verbal and written material to discuss and apply goal setting through the application of the S.M.A.R.T. Method.   Cardiac Rehab from 04/03/2017 in [facility] Cardiac and Pulmonary Rehab  Date  03/11/17  Educator  CE  Instruction Review Code  2- meets goals/outcomes      Other Matters of the Heart: - Provides group verbal, written materials and models to describe Heart Failure, Angina, Valve Disease, and Diabetes in the realm of heart disease. Includes description of the disease process and treatment options available to the cardiac patient.   Cardiac Rehab from 04/03/2017 in [facility] Cardiac and Pulmonary Rehab  Date  03/04/17  Educator  CE  Instruction Review Code  2- meets goals/outcomes      Exercise & Equipment Safety: - Individual verbal instruction and demonstration of equipment use and safety with use of the equipment.   Cardiac Rehab from 04/03/2017 in [facility] Cardiac and Pulmonary Rehab  Date  01/14/17  Educator  C. EnterkinRN  Instruction Review Code  1- partially meets, needs review/practice       Infection Prevention: - Provides verbal and written material to individual with discussion of infection control including proper hand washing and proper equipment cleaning during exercise session.   Cardiac Rehab from 04/03/2017 in [facility] Cardiac and Pulmonary Rehab  Date  01/14/17  Educator  C.EnterkinRN  Instruction Review Code  2- meets goals/outcomes      Falls Prevention: - Provides verbal and written material to individual with discussion of falls prevention and safety.   Cardiac Rehab from 04/03/2017 in [facility] Cardiac and Pulmonary Rehab  Date  01/14/17  Educator  C. EnterkinRN  Instruction Review Code  2- meets goals/outcomes      Diabetes: -  Individual verbal and written instruction to review signs/symptoms of diabetes, desired ranges of glucose level fasting, after meals and with exercise. Advice that pre and post exercise glucose checks will be done for 3 sessions at entry of program.   Cardiac Rehab from 04/03/2017 in [facility] Cardiac and Pulmonary Rehab  Date  01/14/17  Educator  C. Enterkin, RN  Instruction Review Code  1- partially meets, needs review/practice       Knowledge Questionnaire Score:     Knowledge Questionnaire Score - 04/01/17 0833      Knowledge Questionnaire Score   Pre Score 1/28   Post Score 24/28      Core Components/Risk Factors/Patient Goals at Admission:     Personal Goals and Risk Factors at Admission - 01/14/17 1217      Core Components/Risk Factors/Patient Goals on Admission    Weight Management Yes;Obesity   Intervention Weight Management: Develop a combined nutrition and exercise program designed to reach desired caloric intake, while maintaining appropriate intake of nutrient and fiber, sodium and fats, and appropriate energy expenditure required for the weight goal.;Weight Management: Provide education and appropriate resources to help participant work on and attain dietary goals.;Weight Management/Obesity: Establish reasonable  short term and long term weight goals.;Obesity: Provide education and appropriate resources to help participant work on and attain dietary goals.   Admit Weight 273 lb 14.4 oz (124.2 kg)   Goal Weight: Short Term 270 lb (122.5 kg)   Goal Weight: Long Term 260 lb (117.9 kg)   Expected Outcomes Short Term: Continue to assess and modify interventions until short term weight is achieved;Weight Maintenance: Understanding of the daily nutrition guidelines, which includes 25-35% calories from fat, 7% or less cal from saturated fats, less than '200mg'$  cholesterol, less than 1.5gm of sodium, & 5 or more servings of fruits and vegetables daily;Understanding recommendations for meals to include 15-35% energy as protein, 25-35% energy from fat, 35-60% energy from carbohydrates, less than '200mg'$  of dietary cholesterol, 20-35 gm of total fiber daily;Long Term: Adherence to nutrition and physical activity/exercise program aimed toward attainment of established weight goal;Weight Loss: Understanding of general recommendations for a balanced deficit meal plan, which promotes 1-2 lb weight loss per week and includes a negative energy balance of [phone] kcal/d;Understanding of distribution of calorie intake throughout the day with the consumption of 4-5 meals/snacks   Diabetes Yes   Intervention Provide education about signs/symptoms and action to take for hypo/hyperglycemia.;Provide education about proper nutrition, including hydration, and aerobic/resistive exercise prescription along with prescribed medications to achieve blood glucose in normal ranges: Fasting glucose 65-99 mg/dL   Expected Outcomes Short Term: Participant verbalizes understanding of the signs/symptoms and immediate care of hyper/hypoglycemia, proper foot care and importance of medication, aerobic/resistive exercise and nutrition plan for blood glucose control.;Long Term: Attainment of HbA1C < 7%.   Hypertension Yes   Intervention Provide education on  lifestyle modifcations including regular physical activity/exercise, weight management, moderate sodium restriction and increased consumption of fresh fruit, vegetables, and low fat dairy, alcohol moderation, and smoking cessation.;Monitor prescription use compliance.   Expected Outcomes Short Term: Continued assessment and intervention until BP is < 140/33m HG in hypertensive participants. < 130/864mHG in hypertensive participants with diabetes, heart failure or chronic kidney disease.;Long Term: Maintenance of blood pressure at goal levels.   Lipids Yes   Intervention Provide education and support for participant on nutrition & aerobic/resistive exercise along with prescribed medications to achieve LDL '70mg'$ , HDL >'40mg'$ .   Expected Outcomes Short Term: Participant states  understanding of desired cholesterol values and is compliant with medications prescribed. Participant is following exercise prescription and nutrition guidelines.;Long Term: Cholesterol controlled with medications as prescribed, with individualized exercise RX and with personalized nutrition plan. Value goals: LDL < 59m, HDL > 40 mg.      Core Components/Risk Factors/Patient Goals Review:      Goals and Risk Factor Review    Row Name 02/08/17 0824 02/08/17 0826 02/08/17 0827 03/08/17 0843       Core Components/Risk Factors/Patient Goals Review   Personal Goals Review Weight Management/Obesity Weight Management/Obesity Diabetes;Hypertension;Stress Diabetes;Hypertension;Weight Management/Obesity;Lipids    Review 269lbs today. Stress still with his wife "she said I have to do it and order her a dress or clothes since she has gained alot of weight due to her illness". TClare Gandyreports today is the first day that he has felt better since the MD increased his Benicar on Monday and he finally doesn't have a headache. TClare Gandygot a new blood pressure machine since he had his last one for 18 years and it wasn't correlating with u[place]ore[place] it  manually.  TClare Gandyhas lost 4lbs.  [name]'s blood pressure and he is feeling better since MD increased his Benicar on Monday. TClare Gandyreports he finally doesn't have a headache. He helps take care of his wife who takes a few bites then is so short of breath even on oxgyen that she has to rest in between bites of food.  TClare Gandyhas been doing well in rehab.  His weight has been pretty steady.  He would like to lose more weight and we talked about increasing his cardio at home to help.  [name]'s blood pressures have been getting better. He is still getting high readings at home on his new cuff.  He is going to bring in his cuff next week to compare against ours.  TClare Gandyhas been disappointed in his blood sugars recently as they have been running above 200 mg/dl.  He admits to snacking and not eating as well as he should that may be playing a part in it.  He is doing well with his medications.    Expected Outcomes  -  - Heart healthy lifestyle. Feeling better since MD increased his Benicar-to cont this.  Short: Continue to keep a close eye on his blood pressures and blood sugars.  Long: Continue to work on risk factor modifications.       Core Components/Risk Factors/Patient Goals at Discharge (Final Review):      Goals and Risk Factor Review - 03/08/17 0843      Core Components/Risk Factors/Patient Goals Review   Personal Goals Review Diabetes;Hypertension;Weight Management/Obesity;Lipids   Review TClare Gandyhas been doing well in rehab.  His weight has been pretty steady.  He would like to lose more weight and we talked about increasing his cardio at home to help.  [name]'s blood pressures have been getting better. He is still getting high readings at home on his new cuff.  He is going to bring in his cuff next week to compare against ours.  TClare Gandyhas been disappointed in his blood sugars recently as they have been running above 200 mg/dl.  He admits to snacking and not eating as well as he should that may be playing a part in it.  He is  doing well with his medications.   Expected Outcomes Short: Continue to keep a close eye on his blood pressures and blood sugars.  Long: Continue to work on risk factor modifications.  ITP Comments:     ITP Comments    Row Name 01/14/17 1218 01/30/17 3710 01/30/17 0634 02/01/17 0943 02/11/17 0829   ITP Comments ITP created during Medical review after Cardiac Rehab informed consent was signed by Cindi Carbon" [place]. Documentation of Diagnosis CHL/EPIC 30 day review. Continue with ITP unless directed changes per Medical Director review   New to program 30 day review. Continue with ITP unless directed changes per Medical Director review   New to program When [name] came in today, his blood pressure was 190/96.  He sat for awhile and recovered to 174/76.  He was able to exercise and came down throughout exercise.  After exercise he was down to 126/74.  [name] arrived today in Cardiac REhab with a high blood pressure after he reported that [place] increased his benicar but his blood pressure was still up all weekend.    [place] Name 02/11/17 [phone] 02/11/17 1634 02/27/17 0624 02/27/17 0625 03/22/17 0854   ITP Comments Taken to [facility] since [place] said he has been worried all weekend about his blood pressure which has been high.  [name] was discharged and added amlodipine 5 mg daily 30 day review. Continue with ITP unless directed changes per Medical Director review    30 day review. Continue with ITP unless directed changes per Medical Director review    Patient has some angina today with a 4/10 pain. He took a nitro tablet that brought his blood pressure down to 158/82 from 182/98. He completed the session today and states he has no chest pain after the nitro tablet. [name] states that if varies day to day with his angina.    [place] Name 03/27/17 0556 03/27/17 0920         ITP Comments 30 day review. Continue with ITP unless directed changes per Medical Director review  [name]'s blood pressure was elevated again  today coming in and during exercise.  He worked at reduced workloads to keep it from getting too high.  He was encouraged to contact his doctor.  He said he would call if the headaches returned. He does have an appointment in two weeks.          Comments: discharge ITP

## 2017-04-03 NOTE — Progress Notes (Signed)
Daily Session Note  Patient Details  Name: [name] MRN: [id number] Date of Birth: 09-Oct-1945 Referring Provider:     Cardiac Rehab from 01/14/2017 in [facility] Cardiac and Pulmonary Rehab  Referring Provider  Barrie Lyme MD      Encounter Date: 04/03/2017  Check In:     Session Check In - 04/03/17 0831      Check-In   Location [facility]-Cardiac & Pulmonary Rehab   Staff Present Heath Lark, RN, BSN, CCRP;Jessica Luan Pulling, MA, ACSM RCEP, Exercise Physiologist;Pahola Dimmitt Flavia Shipper   Supervising physician immediately available to respond to emergencies See telemetry face sheet for immediately available ER MD   Medication changes reported     No   Fall or balance concerns reported    No   Warm-up and Cool-down Performed on first and last piece of equipment   Resistance Training Performed Yes   VAD Patient? No     Pain Assessment   Currently in Pain? No/denies   Multiple Pain Sites No           Exercise Prescription Changes - 04/02/17 1400      Response to Exercise   Blood Pressure (Admit) 134/66   Blood Pressure (Exercise) 172/70   Blood Pressure (Exit) 126/60   Heart Rate (Admit) 66 bpm   Heart Rate (Exercise) 98 bpm   Heart Rate (Exit) 72 bpm   Rating of Perceived Exertion (Exercise) 13   Symptoms none   Duration Continue with 45 min of aerobic exercise without signs/symptoms of physical distress.   Intensity THRR unchanged     Progression   Progression Continue to progress workloads to maintain intensity without signs/symptoms of physical distress.   Average METs 3.74     Resistance Training   Training Prescription Yes   Weight 7 lbs   Reps 10-15     Interval Training   Interval Training No     Recumbant Bike   Level 3   Watts 43   Minutes 15   METs 3.13     NuStep   Level 9   Minutes 15   METs 2.6     REL-XR   Level 11   Minutes 15   METs 5.5     Home Exercise Plan   Plans to continue exercise at Home (comment)  walking   Frequency  Add 2 additional days to program exercise sessions.   Initial Home Exercises Provided 01/30/17      History  Smoking Status  . Never Smoker  Smokeless Tobacco  . Never Used    Goals Met:  Independence with exercise equipment Exercise tolerated well No report of cardiac concerns or symptoms Strength training completed today  Goals Unmet:  Not Applicable  Comments:  [name] graduated today from cardiac rehab with 36 sessions completed.  Details of the patient's exercise prescription and what He needs to do in order to continue the prescription and progress were discussed with patient.  Patient was given a copy of prescription and goals.  Patient verbalized understanding.  [name] plans to continue to exercise by going to [employer]   Dr. Emily Filbert is Medical Director for [name].

## 2017-04-03 NOTE — Progress Notes (Signed)
Discharge Summary  Patient Details  Name: [name] MRN: [id number] Date of Birth: 05-09-1946 Referring Provider:     Cardiac Rehab from 01/14/2017 in [facility]  Referring Provider  Jerry Lyme MD       Number of Visits: 56  Reason for Discharge:  Patient reached a stable level of exercise. Patient independent in their exercise.  Smoking History:  History  Smoking Status  . Never Smoker  Smokeless Tobacco  . Never Used    Diagnosis:  S/P CABG x 3  S/P PTCA (percutaneous transluminal coronary angioplasty)  ADL UCSD:   Initial Exercise Prescription:     Initial Exercise Prescription - 01/14/17 1400      Date of Initial Exercise RX and Referring Provider   Date 01/14/17   Referring Provider Jerry Lyme MD     Treadmill   MPH 1.8   Grade 0   Minutes 15   METs 2.38     Recumbant Bike   Level 1   RPM 50   Watts 22   Minutes 15   METs 2.5     NuStep   Level 2   SPM 80   Minutes 15   METs 2     Prescription Details   Frequency (times per week) 3   Duration Progress to 45 minutes of aerobic exercise without signs/symptoms of physical distress     Intensity   THRR 40-80% of Max Heartrate 110-137   Ratings of Perceived Exertion 11-13   Perceived Dyspnea 0-4     Progression   Progression Continue to progress workloads to maintain intensity without signs/symptoms of physical distress.     Resistance Training   Training Prescription Yes   Weight 3 lbs   Reps 10-15      Discharge Exercise Prescription (Final Exercise Prescription Changes):     Exercise Prescription Changes - 04/02/17 1400      Response to Exercise   Blood Pressure (Admit) 134/66   Blood Pressure (Exercise) 172/70   Blood Pressure (Exit) 126/60   Heart Rate (Admit) 66 bpm   Heart Rate (Exercise) 98 bpm   Heart Rate (Exit) 72 bpm   Rating of Perceived Exertion (Exercise) 13   Symptoms none   Duration Continue with 45 min of aerobic  exercise without signs/symptoms of physical distress.   Intensity THRR unchanged     Progression   Progression Continue to progress workloads to maintain intensity without signs/symptoms of physical distress.   Average METs 3.74     Resistance Training   Training Prescription Yes   Weight 7 lbs   Reps 10-15     Interval Training   Interval Training No     Recumbant Bike   Level 3   Watts 43   Minutes 15   METs 3.13     NuStep   Level 9   Minutes 15   METs 2.6     REL-XR   Level 11   Minutes 15   METs 5.5     Home Exercise Plan   Plans to continue exercise at Home (comment)  walking   Frequency Add 2 additional days to program exercise sessions.   Initial Home Exercises Provided 01/30/17      Functional Capacity:     6 Minute Walk    Row Name 01/14/17 1409 04/01/17 0832       6 Minute Walk   Phase Initial Discharge    Distance 1040 feet 1260 feet  Distance % Change  - 21 %  220 ft    Walk Time 6 minutes 6 minutes    # of Rest Breaks 0 0    MPH 1.97 2.39    METS 2.55 2.89    RPE 13 13    VO2 Peak 8.91 10.11    Symptoms No No    Resting HR 72 bpm 66 bpm    Resting BP 178/100  recheck 164/84 134/66    Max Ex. HR 101 bpm 109 bpm    Max Ex. BP 196/86 172/70    2 Minute Post BP 172/86  -       Psychological, QOL, Others - Outcomes: PHQ 2/9: Depression screen [facility] 2/9 04/01/2017 01/14/2017  Decreased Interest 0 0  Down, Depressed, Hopeless 0 0  PHQ - 2 Score 0 0  Altered sleeping 0 0  Tired, decreased energy 1 3  Change in appetite 0 0  Feeling bad or failure about yourself  0 0  Trouble concentrating 0 0  Moving slowly or fidgety/restless 0 0  Suicidal thoughts 0 0  PHQ-9 Score 1 3  Difficult doing work/chores Not difficult at all Not difficult at all    Quality of Life:     Quality of Life - 04/01/17 0835      Quality of Life Scores   Health/Function Pre 24.75 %   Health/Function Post 22.53 %   Health/Function % Change -8.97 %    Socioeconomic Pre 30 %   Socioeconomic Post 28.75 %   Socioeconomic % Change  -4.17 %   Psych/Spiritual Pre 28.57 %   Psych/Spiritual Post 23.57 %   Psych/Spiritual % Change -17.5 %   Family Pre 25.5 %   Family Post 22.9 %   Family % Change -10.2 %   GLOBAL Pre 26.69 %   GLOBAL Post 23.94 %   GLOBAL % Change -10.3 %      Personal Goals: Goals established at orientation with interventions provided to work toward goal.     Personal Goals and Risk Factors at Admission - 01/14/17 1217      Core Components/Risk Factors/Patient Goals on Admission    Weight Management Yes;Obesity   Intervention Weight Management: Develop a combined nutrition and exercise program designed to reach desired caloric intake, while maintaining appropriate intake of nutrient and fiber, sodium and fats, and appropriate energy expenditure required for the weight goal.;Weight Management: Provide education and appropriate resources to help participant work on and attain dietary goals.;Weight Management/Obesity: Establish reasonable short term and long term weight goals.;Obesity: Provide education and appropriate resources to help participant work on and attain dietary goals.   Admit Weight 273 lb 14.4 oz (124.2 kg)   Goal Weight: Short Term 270 lb (122.5 kg)   Goal Weight: Long Term 260 lb (117.9 kg)   Expected Outcomes Short Term: Continue to assess and modify interventions until short term weight is achieved;Weight Maintenance: Understanding of the daily nutrition guidelines, which includes 25-35% calories from fat, 7% or less cal from saturated fats, less than 200mg  cholesterol, less than 1.5gm of sodium, & 5 or more servings of fruits and vegetables daily;Understanding recommendations for meals to include 15-35% energy as protein, 25-35% energy from fat, 35-60% energy from carbohydrates, less than 200mg  of dietary cholesterol, 20-35 gm of total fiber daily;Long Term: Adherence to nutrition and physical activity/exercise  program aimed toward attainment of established weight goal;Weight Loss: Understanding of general recommendations for a balanced deficit meal plan, which promotes 1-2 lb weight  loss per week and includes a negative energy balance of [phone] kcal/d;Understanding of distribution of calorie intake throughout the day with the consumption of 4-5 meals/snacks   Diabetes Yes   Intervention Provide education about signs/symptoms and action to take for hypo/hyperglycemia.;Provide education about proper nutrition, including hydration, and aerobic/resistive exercise prescription along with prescribed medications to achieve blood glucose in normal ranges: Fasting glucose 65-99 mg/dL   Expected Outcomes Short Term: Participant verbalizes understanding of the signs/symptoms and immediate care of hyper/hypoglycemia, proper foot care and importance of medication, aerobic/resistive exercise and nutrition plan for blood glucose control.;Long Term: Attainment of HbA1C < 7%.   Hypertension Yes   Intervention Provide education on lifestyle modifcations including regular physical activity/exercise, weight management, moderate sodium restriction and increased consumption of fresh fruit, vegetables, and low fat dairy, alcohol moderation, and smoking cessation.;Monitor prescription use compliance.   Expected Outcomes Short Term: Continued assessment and intervention until BP is < 140/84mm HG in hypertensive participants. < 130/69mm HG in hypertensive participants with diabetes, heart failure or chronic kidney disease.;Long Term: Maintenance of blood pressure at goal levels.   Lipids Yes   Intervention Provide education and support for participant on nutrition & aerobic/resistive exercise along with prescribed medications to achieve LDL 70mg , HDL >40mg .   Expected Outcomes Short Term: Participant states understanding of desired cholesterol values and is compliant with medications prescribed. Participant is following exercise  prescription and nutrition guidelines.;Long Term: Cholesterol controlled with medications as prescribed, with individualized exercise RX and with personalized nutrition plan. Value goals: LDL < 70mg , HDL > 40 mg.       Personal Goals Discharge:     Goals and Risk Factor Review    Row Name 02/08/17 0824 02/08/17 0826 02/08/17 0827 03/08/17 0843       Core Components/Risk Factors/Patient Goals Review   Personal Goals Review Weight Management/Obesity Weight Management/Obesity Diabetes;Hypertension;Stress Diabetes;Hypertension;Weight Management/Obesity;Lipids    Review 269lbs today. Stress still with his wife "she said I have to do it and order her a dress or clothes since she has gained alot of weight due to her illness". [name] reports today is the first day that he has felt better since the MD increased his Benicar on Monday and he finally doesn't have a headache. [name] got a new blood pressure machine since he had his last one for 18 years and it wasn't correlating with [place] taking it manually.  [name] has lost 4lbs.  [name]'s blood pressure and he is feeling better since MD increased his Benicar on Monday. [name] reports he finally doesn't have a headache. He helps take care of his wife who takes a few bites then is so short of breath even on oxgyen that she has to rest in between bites of food.  [name] has been doing well in rehab.  His weight has been pretty steady.  He would like to lose more weight and we talked about increasing his cardio at home to help.  [name]'s blood pressures have been getting better. He is still getting high readings at home on his new cuff.  He is going to bring in his cuff next week to compare against ours.  [name] has been disappointed in his blood sugars recently as they have been running above 200 mg/dl.  He admits to snacking and not eating as well as he should that may be playing a part in it.  He is doing well with his medications.    Expected Outcomes  -  - Heart healthy lifestyle. Feeling  better since MD increased his Benicar-to cont this.  Short: Continue to keep a close eye on his blood pressures and blood sugars.  Long: Continue to work on risk factor modifications.       Nutrition & Weight - Outcomes:     Pre Biometrics - 01/14/17 1413      Pre Biometrics   Height 6' 1.8" (1.875 m)   Weight 273 lb 14.4 oz (124.2 kg)   Waist Circumference 50 inches   Hip Circumference 47 inches   Waist to Hip Ratio 1.06 %   BMI (Calculated) 35.4   Single Leg Stand 9.59 seconds       Nutrition:     Nutrition Therapy & Goals - 03/08/17 0850      Nutrition Therapy   RD appointment defered Yes      Nutrition Discharge:     Nutrition Assessments - 04/01/17 0833      MEDFICTS Scores   Pre Score 119   Post Score 74   Score Difference -45      Education Questionnaire Score:     Knowledge Questionnaire Score - 04/01/17 0833      Knowledge Questionnaire Score   Pre Score 1/28   Post Score 24/28      Goals reviewed with patient; copy given to patient.

## 2017-04-16 ENCOUNTER — Encounter: Payer: Self-pay | Admitting: *Deleted

## 2017-04-16 DIAGNOSIS — Z9861 Coronary angioplasty status: Secondary | ICD-10-CM

## 2017-04-16 DIAGNOSIS — Z951 Presence of aortocoronary bypass graft: Secondary | ICD-10-CM

## 2017-04-16 NOTE — Progress Notes (Signed)
Cardiac Individual Treatment Plan  Patient Details  Name: [name] MRN: [id number] Date of Birth: [birth year] Referring Provider:     Cardiac Rehab from 01/14/2017 in [facility] Cardiac and Pulmonary Rehab  Referring Provider  Barrie Lyme MD      Initial Encounter Date:    Cardiac Rehab from 01/14/2017 in [facility] Cardiac and Pulmonary Rehab  Date  01/14/17  Referring Provider  Barrie Lyme MD      Visit Diagnosis: S/P CABG x 3  S/P PTCA (percutaneous transluminal coronary angioplasty)  Patient's Home Medications on Admission:  Current Outpatient Prescriptions:  .  amLODipine (NORVASC) 5 MG tablet, Take 1 tablet (5 mg total) by mouth daily., Disp: 30 tablet, Rfl: 0 .  aspirin EC 81 MG tablet, Take 81 mg by mouth daily., Disp: , Rfl:  .  clopidogrel (PLAVIX) 75 MG tablet, Take by mouth daily. , Disp: , Rfl:  .  insulin glargine (LANTUS) 100 UNIT/ML injection, Inject 10 Units into the skin at bedtime., Disp: , Rfl:  .  metFORMIN (GLUCOPHAGE) 1000 MG tablet, Take 500 mg by mouth 2 (two) times daily with a meal. , Disp: , Rfl:  .  nebivolol (BYSTOLIC) 10 MG tablet, Take 10 mg by mouth daily. , Disp: , Rfl:  .  olmesartan (BENICAR) 5 MG tablet, Take 10 mg by mouth daily. , Disp: , Rfl:  .  omeprazole (PRILOSEC) 40 MG capsule, Take 40 mg by mouth daily., Disp: , Rfl:  .  rosuvastatin (CRESTOR) 20 MG tablet, Take 20 mg by mouth daily., Disp: , Rfl:  .  sitaGLIPtin (JANUVIA) 100 MG tablet, Take 100 mg by mouth daily., Disp: , Rfl:   Past Medical History: Past Medical History:  Diagnosis Date  . Arthritis   . Coronary artery disease   . Diabetes mellitus without complication ([place])   . GERD (gastroesophageal reflux disease)   . Sleep apnea     Tobacco Use: History  Smoking Status  . Never Smoker  Smokeless Tobacco  . Never Used    Labs: Recent Review Flowsheet Data    There is no flowsheet data to display.       Exercise Target Goals:    Exercise Program  Goal: Individual exercise prescription set with THRR, safety & activity barriers. Participant demonstrates ability to understand and report RPE using BORG scale, to self-measure pulse accurately, and to acknowledge the importance of the exercise prescription.  Exercise Prescription Goal: Starting with aerobic activity 30 plus minutes a day, 3 days per week for initial exercise prescription. Provide home exercise prescription and guidelines that participant acknowledges understanding prior to discharge.  Activity Barriers & Risk Stratification:   6 Minute Walk:     6 Minute Walk    Row Name 04/01/17 0832         6 Minute Walk   Phase Discharge     Distance 1260 feet     Distance % Change 21 %  220 ft     Walk Time 6 minutes     # of Rest Breaks 0     MPH 2.39     METS 2.89     RPE 13     VO2 Peak 10.11     Symptoms No     Resting HR 66 bpm     Resting BP 134/66     Max Ex. HR 109 bpm     Max Ex. BP 172/70        Oxygen Initial Assessment:  Oxygen Re-Evaluation:   Oxygen Discharge (Final Oxygen Re-Evaluation):   Initial Exercise Prescription:   Perform Capillary Blood Glucose checks as needed.  Exercise Prescription Changes:     Exercise Prescription Changes    Row Name 02/19/17 1000 03/06/17 1500 03/22/17 0800 04/02/17 1400       Response to Exercise   Blood Pressure (Admit) 160/80 136/74 162/76 134/66    Blood Pressure (Exercise) 192/88 162/82 212/84  rck 172/80 172/70    Blood Pressure (Exit) 176/80 162/88 138/70 126/60    Heart Rate (Admit) 81 bpm 75 bpm 68 bpm 66 bpm    Heart Rate (Exercise) 93 bpm 94 bpm 91 bpm 98 bpm    Heart Rate (Exit) 80 bpm 67 bpm 67 bpm 72 bpm    Rating of Perceived Exertion (Exercise) _0 Symptoms none none chest pain and htn none    Duration Continue with 45 min of aerobic exercise without signs/symptoms of physical distress. Continue with 45 min of aerobic exercise without signs/symptoms of physical distress.  Continue with 45 min of aerobic exercise without signs/symptoms of physical distress. Continue with 45 min of aerobic exercise without signs/symptoms of physical distress.    Intensity THRR unchanged THRR unchanged THRR unchanged THRR unchanged      Progression   Progression Continue to progress workloads to maintain intensity without signs/symptoms of physical distress. Continue to progress workloads to maintain intensity without signs/symptoms of physical distress. Continue to progress workloads to maintain intensity without signs/symptoms of physical distress. Continue to progress workloads to maintain intensity without signs/symptoms of physical distress.    Average METs 2.65 3.35 3.81 3.74      Resistance Training   Training Prescription Yes Yes Yes Yes    Weight 3 3 lbs 3 lbs 7 lbs    Reps 10-15 10-15 10-15 10-15      Interval Training   Interval Training No No No No      Recumbant Bike   Level  - _1 Watts  - 29 43 43    Minutes  - _2 METs  - 2.76 3.13 3.13      NuStep   Level _3 Minutes _4 METs 3.8 3.7 2.6 2.6      REL-XR   Level _5 Minutes _6 METs 1.5 3.6 5.7 5.5      Home Exercise Plan   Plans to continue exercise at  - Home (comment)  walking Home (comment)  walking Home (comment)  walking    Frequency  - Add 2 additional days to program exercise sessions. Add 2 additional days to program exercise sessions. Add 2 additional days to program exercise sessions.    Initial Home Exercises Provided  - 01/30/17 01/30/17 01/30/17       Exercise Comments:     Exercise Comments    Row Name 03/20/17 0848           Exercise Comments [name]'s blood pressure was elevated on the XR today.  He was 212/84, workload was reduced and recheck was 172/80.  He was able to continue to exercise and stayed in the 170s.  He had started the day in the 160s.  He is looking into getting a new blood pressure cuff for home use, as the  one he has is  reading him higher than what we are getting here.  He was able to cool down and return his pressure to normal.           Exercise Goals and Review:   Exercise Goals Re-Evaluation :     Exercise Goals Re-Evaluation    Row Name 02/19/17 1020 03/06/17 1538 03/08/17 0839 03/22/17 0834 04/01/17 0833     Exercise Goal Re-Evaluation   Exercise Goals Review Increase Physical Activity;Increase Strenth and Stamina Increase Physical Activity;Increase Strenth and Stamina Increase Strenth and Stamina;Increase Physical Activity Increase Strenth and Stamina;Increase Physical Activity Increase Strenth and Stamina;Increase Physical Activity   Comments [name] is progressing well with exercise and has increased MET level. [name] continues to do well in rehab.  His blood pressures have been doing better.  He is up to level 6 on everything.  We will continue to monitor his progression. [name] has been getting stronger. He has really noticed a difference in his leg strength with all of the exercise.  He is doing some at home with strength and stretching.  We talked about increasing his cardio at home.    [name] has been doing well in rehab.  He does develop some chest pain during exercise from time to time.  He is still hesistant to exercise more at home.  We will continue to monitor his progression.  Improved post 6MWT by 21%!!   Expected Outcomes [name] will continue to attend regularly and increase overall strength and stamina. Short: Continue to keep a close on eye on his blood pressures.   Long: Continue to exercise routinely. Short: Add in more cardio.  Long: Make exercise part of daily routine. Short: Add in more exercise at home.  Long: Make exercise part of his normal routine.   -   Row Name 04/02/17 1353             Exercise Goal Re-Evaluation   Exercise Goals Review Increase Strenth and Stamina;Increase Physical Activity       Comments [name] is nearing graduation!!  He continues to have issues with  elevated blood pressure, but he does have an appointment coming up to discuss better control.  We will continue to monitor his progress.       Expected Outcomes Short: Graduate!!  Long: Continue to exercise by walking at home,.           Discharge Exercise Prescription (Final Exercise Prescription Changes):     Exercise Prescription Changes - 04/02/17 1400      Response to Exercise   Blood Pressure (Admit) 134/66   Blood Pressure (Exercise) 172/70   Blood Pressure (Exit) 126/60   Heart Rate (Admit) 66 bpm   Heart Rate (Exercise) 98 bpm   Heart Rate (Exit) 72 bpm   Rating of Perceived Exertion (Exercise) 13   Symptoms none   Duration Continue with 45 min of aerobic exercise without signs/symptoms of physical distress.   Intensity THRR unchanged     Progression   Progression Continue to progress workloads to maintain intensity without signs/symptoms of physical distress.   Average METs 3.74     Resistance Training   Training Prescription Yes   Weight 7 lbs   Reps 10-15     Interval Training   Interval Training No     Recumbant Bike   Level 3   Watts 43   Minutes 15   METs 3.13     NuStep   Level 9   Minutes 15   METs 2.6  REL-XR   Level 11   Minutes 15   METs 5.5     Home Exercise Plan   Plans to continue exercise at Home (comment)  walking   Frequency Add 2 additional days to program exercise sessions.   Initial Home Exercises Provided 01/30/17      Nutrition:  Target Goals: Understanding of nutrition guidelines, daily intake of sodium <1590m, cholesterol <2058m calories 30% from fat and 7% or less from saturated fats, daily to have 5 or more servings of fruits and vegetables.  Biometrics:    Nutrition Therapy Plan and Nutrition Goals:     Nutrition Therapy & Goals - 03/08/17 0850      Nutrition Therapy   RD appointment defered Yes      Nutrition Discharge: Rate Your Plate Scores:     Nutrition Assessments - 04/01/17 0833       MEDFICTS Scores   Pre Score 119   Post Score 74   Score Difference -45      Nutrition Goals Re-Evaluation:     Nutrition Goals Re-Evaluation    Row Name 03/08/17 0851             Goals   Nutrition Goal Heart Healthy Diet       Comment TeClare Gandyontinues to decline the nutrition appointments.  He has been eating better, but admits to cheating some on his diet.  He has been sticking to lower sodium and using more pepper.       Expected Outcome Short: Cut out snacking at night.  Long: Continue with heart healthy diet.          Nutrition Goals Discharge (Final Nutrition Goals Re-Evaluation):     Nutrition Goals Re-Evaluation - 03/08/17 0851      Goals   Nutrition Goal Heart Healthy Diet   Comment TeClare Gandyontinues to decline the nutrition appointments.  He has been eating better, but admits to cheating some on his diet.  He has been sticking to lower sodium and using more pepper.   Expected Outcome Short: Cut out snacking at night.  Long: Continue with heart healthy diet.      Psychosocial: Target Goals: Acknowledge presence or absence of significant depression and/or stress, maximize coping skills, provide positive support system. Participant is able to verbalize types and ability to use techniques and skills needed for reducing stress and depression.   Initial Review & Psychosocial Screening:   Quality of Life Scores:      Quality of Life - 04/01/17 0835      Quality of Life Scores   Health/Function Pre 24.75 %   Health/Function Post 22.53 %   Health/Function % Change -8.97 %   Socioeconomic Pre 30 %   Socioeconomic Post 28.75 %   Socioeconomic % Change  -4.17 %   Psych/Spiritual Pre 28.57 %   Psych/Spiritual Post 23.57 %   Psych/Spiritual % Change -17.5 %   Family Pre 25.5 %   Family Post 22.9 %   Family % Change -10.2 %   GLOBAL Pre 26.69 %   GLOBAL Post 23.94 %   GLOBAL % Change -10.3 %      PHQ-9: Recent Review Flowsheet Data    [facility]  04/01/2017 01/14/2017   Decreased Interest 0 0   Down, Depressed, Hopeless 0 0   PHQ - 2 Score 0 0   Altered sleeping 0 0   Tired, decreased energy 1 3   Change in appetite 0 0   Feeling  bad or failure about yourself  0 0   Trouble concentrating 0 0   Moving slowly or fidgety/restless 0 0   Suicidal thoughts 0 0   PHQ-9 Score 1 3   Difficult doing work/chores Not difficult at all Not difficult at all     Interpretation of Total Score  Total Score Depression Severity:  1-4 = Minimal depression, 5-9 = Mild depression, 10-14 = Moderate depression, 15-19 = Moderately severe depression, 20-27 = Severe depression   Psychosocial Evaluation and Intervention:   Psychosocial Re-Evaluation:     Psychosocial Re-Evaluation    Row Name 03/08/17 [phone]             Psychosocial Re-Evaluation   Current issues with Current Sleep Concerns;Current Stress Concerns       Comments [name] feels that he has been doing well mentally.  Mood has been stable.  He tries to take things just as they come since his surgery trying not to let anything get to him too much.  Sleep continues to evade him, but this has been a long term issue.  He feels that coming to the program has been very beneficial and he has gotten a lot from it.       Expected Outcomes Short: Contiue to maintain positive outlook and stable mood.  Long: Continue to work on improving his sleep quality.       Interventions Stress management education;Encouraged to attend Cardiac Rehabilitation for the exercise       Continue Psychosocial Services  Follow up required by staff         Initial Review   Source of Stress Concerns Chronic Illness          Psychosocial Discharge (Final Psychosocial Re-Evaluation):     Psychosocial Re-Evaluation - 03/08/17 0854      Psychosocial Re-Evaluation   Current issues with Current Sleep Concerns;Current Stress Concerns   Comments [name] feels that he has been doing well mentally.  Mood has been stable.  He  tries to take things just as they come since his surgery trying not to let anything get to him too much.  Sleep continues to evade him, but this has been a long term issue.  He feels that coming to the program has been very beneficial and he has gotten a lot from it.   Expected Outcomes Short: Contiue to maintain positive outlook and stable mood.  Long: Continue to work on improving his sleep quality.   Interventions Stress management education;Encouraged to attend Cardiac Rehabilitation for the exercise   Continue Psychosocial Services  Follow up required by staff     Initial Review   Source of Stress Concerns Chronic Illness      Vocational Rehabilitation: Provide vocational rehab assistance to qualifying candidates.   Vocational Rehab Evaluation & Intervention:   Education: Education Goals: Education classes will be provided on a variety of topics geared toward better understanding of heart health and risk factor modification. Participant will state understanding/return demonstration of topics presented as noted by education test scores.  Learning Barriers/Preferences:   Education Topics: General Nutrition Guidelines/Fats and Fiber: -Group instruction provided by verbal, written material, models and posters to present the general guidelines for heart healthy nutrition. Gives an explanation and review of dietary fats and fiber.   Cardiac Rehab from 04/03/2017 in [facility] Cardiac and Pulmonary Rehab  Date  04/01/17  Educator  CR  Instruction Review Code (retired)  2- meets goals/outcomes      Controlling Sodium/Reading Food Labels: -  Group verbal and written material supporting the discussion of sodium use in heart healthy nutrition. Review and explanation with models, verbal and written materials for utilization of the food label.   Exercise Physiology & Risk Factors: - Group verbal and written instruction with models to review the exercise physiology of the cardiovascular system and  associated critical values. Details cardiovascular disease risk factors and the goals associated with each risk factor.   Cardiac Rehab from 04/03/2017 in [facility] Cardiac and Pulmonary Rehab  Date  02/18/17  Educator  [facility]  Instruction Review Code (retired)  2- meets goals/outcomes      Aerobic Exercise & Resistance Training: - Gives group verbal and written discussion on the health impact of inactivity. On the components of aerobic and resistive training programs and the benefits of this training and how to safely progress through these programs.   Cardiac Rehab from 04/03/2017 in [facility] Cardiac and Pulmonary Rehab  Date  02/20/17  Educator  SB  Instruction Review Code (retired)  2- Statistician, Balance, General Exercise Guidelines: - Provides group verbal and written instruction on the benefits of flexibility and balance training programs. Provides general exercise guidelines with specific guidelines to those with heart or lung disease. Demonstration and skill practice provided.   Stress Management: - Provides group verbal and written instruction about the health risks of elevated stress, cause of high stress, and healthy ways to reduce stress.   Depression: - Provides group verbal and written instruction on the correlation between heart/lung disease and depressed mood, treatment options, and the stigmas associated with seeking treatment.   Cardiac Rehab from 04/03/2017 in [facility] Cardiac and Pulmonary Rehab  Date  04/03/17  Educator  [facility]  Instruction Review Code (retired)  2- meets Designer, fashion/clothing & Physiology of the Heart: - Group verbal and written instruction and models provide basic cardiac anatomy and physiology, with the coronary electrical and arterial systems. Review of: AMI, Angina, Valve disease, Heart Failure, Cardiac Arrhythmia, Pacemakers, and the ICD.   Cardiac Rehab from 04/03/2017 in [facility] Cardiac and Pulmonary Rehab  Date  03/04/17   Educator  CE  Instruction Review Code (retired)  2- meets goals/outcomes      Cardiac Procedures: - Group verbal and written instruction to review commonly prescribed medications for heart disease. Reviews the medication, class of the drug, and side effects. Includes the steps to properly store meds and maintain the prescription regimen. (beta blockers and nitrates)   Cardiac Rehab from 04/03/2017 in [facility] Cardiac and Pulmonary Rehab  Date  03/11/17  Educator  CE  Instruction Review Code (retired)  2- meets goals/outcomes      Cardiac Medications I: - Group verbal and written instruction to review commonly prescribed medications for heart disease. Reviews the medication, class of the drug, and side effects. Includes the steps to properly store meds and maintain the prescription regimen.   Cardiac Rehab from 04/03/2017 in [facility] Cardiac and Pulmonary Rehab  Date  03/18/17 [03/18/17 Part 1 8//8/18 Part 2]  Educator  CE  Instruction Review Code (retired)  2- meets goals/outcomes      Cardiac Medications II: -Group verbal and written instruction to review commonly prescribed medications for heart disease. Reviews the medication, class of the drug, and side effects. (all other drug classes)    Go Sex-Intimacy & Heart Disease, Get SMART - Goal Setting: - Group verbal and written instruction through game format to discuss heart disease and the  return to sexual intimacy. Provides group verbal and written material to discuss and apply goal setting through the application of the S.M.A.R.T. Method.   Cardiac Rehab from 04/03/2017 in [facility] Cardiac and Pulmonary Rehab  Date  03/11/17  Educator  CE  Instruction Review Code (retired)  2- meets goals/outcomes      Other Matters of the Heart: - Provides group verbal, written materials and models to describe Heart Failure, Angina, Valve Disease, Peripheral Artery Disease, and Diabetes in the realm of heart disease. Includes description of the disease  process and treatment options available to the cardiac patient.   Cardiac Rehab from 04/03/2017 in [facility] Cardiac and Pulmonary Rehab  Date  03/04/17  Educator  CE  Instruction Review Code (retired)  2- meets goals/outcomes      Exercise & Equipment Safety: - Individual verbal instruction and demonstration of equipment use and safety with use of the equipment.   Cardiac Rehab from 04/03/2017 in [facility] Cardiac and Pulmonary Rehab  Date  01/14/17  Educator  C. Roscoe  Instruction Review Code (retired)  1- partially meets, needs review/practice      Infection Prevention: - Provides verbal and written material to individual with discussion of infection control including proper hand washing and proper equipment cleaning during exercise session.   Cardiac Rehab from 04/03/2017 in [facility] Cardiac and Pulmonary Rehab  Date  01/14/17  Educator  C.[place]  Instruction Review Code (retired)  2- meets [employer] Prevention: - Provides verbal and written material to individual with discussion of falls prevention and safety.   Cardiac Rehab from 04/03/2017 in [facility] Cardiac and Pulmonary Rehab  Date  01/14/17  Educator  C. [place]  Instruction Review Code (retired)  2- meets goals/outcomes      Diabetes: - Individual verbal and written instruction to review signs/symptoms of diabetes, desired ranges of glucose level fasting, after meals and with exercise. Acknowledge that pre and post exercise glucose checks will be done for 3 sessions at entry of program.   Cardiac Rehab from 04/03/2017 in [facility] Cardiac and Pulmonary Rehab  Date  01/14/17  Educator  C. [place]kin, RN  Instruction Review Code (retired)  1- partially meets, needs review/practice      Other: -Provides group and verbal instruction on various topics (see comments)    Knowledge Questionnaire Score:     Knowledge Questionnaire Score - 04/01/17 0833      Knowledge Questionnaire Score   Pre Score 1/28   Post  Score 24/28      Core Components/Risk Factors/Patient Goals at Admission:   Core Components/Risk Factors/Patient Goals Review:      Goals and Risk Factor Review    Row Name 03/08/17 0843             Core Components/Risk Factors/Patient Goals Review   Personal Goals Review Diabetes;Hypertension;Weight Management/Obesity;Lipids       Review [name] has been doing well in rehab.  His weight has been pretty steady.  He would like to lose more weight and we talked about increasing his cardio at home to help.  [name]'s blood pressures have been getting better. He is still getting high readings at home on his new cuff.  He is going to bring in his cuff next week to compare against ours.  [name] has been disappointed in his blood sugars recently as they have been running above 200 mg/dl.  He admits to snacking and not eating as well as he should that may be playing  a part in it.  He is doing well with his medications.       Expected Outcomes Short: Continue to keep a close eye on his blood pressures and blood sugars.  Long: Continue to work on risk factor modifications.          Core Components/Risk Factors/Patient Goals at Discharge (Final Review):      Goals and Risk Factor Review - 03/08/17 0843      Core Components/Risk Factors/Patient Goals Review   Personal Goals Review Diabetes;Hypertension;Weight Management/Obesity;Lipids   Review [name] has been doing well in rehab.  His weight has been pretty steady.  He would like to lose more weight and we talked about increasing his cardio at home to help.  [name]'s blood pressures have been getting better. He is still getting high readings at home on his new cuff.  He is going to bring in his cuff next week to compare against ours.  [name] has been disappointed in his blood sugars recently as they have been running above 200 mg/dl.  He admits to snacking and not eating as well as he should that may be playing a part in it.  He is doing well with his medications.    Expected Outcomes Short: Continue to keep a close eye on his blood pressures and blood sugars.  Long: Continue to work on risk factor modifications.      ITP Comments:     ITP Comments    Row Name 02/27/17 [phone] 02/27/17 0625 03/22/17 0854 03/27/17 0556 03/27/17 0920   ITP Comments 30 day review. Continue with ITP unless directed changes per Medical Director review    30 day review. Continue with ITP unless directed changes per Medical Director review    Patient has some angina today with a 4/10 pain. He took a nitro tablet that brought his blood pressure down to 158/82 from 182/98. He completed the session today and states he has no chest pain after the nitro tablet. [name] states that if varies day to day with his angina.  30 day review. Continue with ITP unless directed changes per Medical Director review  [name]'s blood pressure was elevated again today coming in and during exercise.  He worked at reduced workloads to keep it from getting too high.  He was encouraged to contact his doctor.  He said he would call if the headaches returned. He does have an appointment in two weeks.    [place] Name 04/16/17 1536           ITP Comments chart review          Comments:

## 2019-07-30 ENCOUNTER — Other Ambulatory Visit: Payer: Self-pay

## 2019-07-30 ENCOUNTER — Ambulatory Visit
Admission: EM | Admit: 2019-07-30 | Discharge: 2019-07-30 | Disposition: A | Discharge status: Home / Self Care | Payer: Medicare HMO | Attending: Family Medicine | Admitting: Family Medicine

## 2019-07-30 ENCOUNTER — Encounter: Payer: Self-pay | Admitting: Emergency Medicine

## 2019-07-30 DIAGNOSIS — I872 Venous insufficiency (chronic) (peripheral): Secondary | ICD-10-CM | POA: Diagnosis not present

## 2019-07-30 DIAGNOSIS — L03116 Cellulitis of left lower limb: Secondary | ICD-10-CM | POA: Diagnosis not present

## 2019-07-30 MED ORDER — DOXYCYCLINE HYCLATE 100 MG PO CAPS: 100.0000 mg | ORAL_CAPSULE | Freq: Two times a day (BID) | ORAL | 0 refills | Status: DC

## 2019-07-30 NOTE — ED Provider Notes (Signed)
[facility]    CSN: [id number] Arrival date & time: 07/30/19  1203  History   Chief Complaint Chief Complaint  Patient presents with  . Leg Pain  . Leg Swelling   HPI   73 year old male presents with the above complaints.  Patient reports ongoing and chronic lower extremity edema.  He states that this started after he had bypass surgery and they used a vein graft from his left leg.  Patient reports that over the past 2 days he has had worsening pain, worsening swelling, and redness and warmth of his left lower extremity.  He rates his pain as 4/10 in severity.  No known inciting factor.  Patient states that he normally wears compression stockings.  He did not do so today so that he could be examined.  No fever.  He has been applying topical ointment without resolution.  No known exacerbating factors.  No other associated symptoms.  No other complaints.  History reviewed and updated as below. Past Medical History:  Diagnosis Date  . Arthritis   . Coronary artery disease   . Diabetes mellitus without complication ([place])   . GERD (gastroesophageal reflux disease)   . Sleep apnea    Patient Active Problem List   Diagnosis Date Noted  . Diabetes ([place]) 01/14/2017  . Essential hypertension 01/14/2017  . OSA (obstructive sleep apnea) 01/14/2017  . BBB (bundle branch block) 08/30/2016  . Obesity with body mass index (BMI) of 30.0 to 39.9 08/30/2016  . 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 APPENDECTOMY    . CARDIAC CATHETERIZATION Left 08/16/2016   Procedure: Left Heart Cath and Coronary Angiography;  Surgeon: Yolonda Kida, MD;  Location: [facility];  Service: Cardiovascular;  Laterality: Left;  . COLON SURGERY    . CORONARY ARTERY BYPASS GRAFT  08/30/2016   triple  . LEFT HEART CATH AND CORS/GRAFTS ANGIOGRAPHY Left 11/20/2016   Procedure: Left Heart Cath and Cors/Grafts Angiography;  Surgeon: Yolonda Kida, MD;   Location: [facility];  Service: Cardiovascular;  Laterality: Left;   Home Medications    Prior to Admission medications   Medication Sig Start Date End Date Taking? Authorizing Provider  amLODipine (NORVASC) 5 MG tablet Take 1 tablet (5 mg total) by mouth daily. Patient taking differently: Take 10 mg by mouth daily.  02/11/17  Yes Lavonia Drafts, MD  aspirin EC 81 MG tablet Take 81 mg by mouth daily.   Yes [provider]  clopidogrel (PLAVIX) 75 MG tablet  12/10/17  Yes [provider]  insulin glargine (LANTUS) 100 UNIT/ML injection Inject 18 Units into the skin at bedtime.    Yes [provider]  lisinopril (ZESTRIL) 5 MG tablet Take by mouth. 12/29/18 12/29/19 Yes [provider]  metFORMIN (GLUCOPHAGE) 1000 MG tablet Take 500 mg by mouth 2 (two) times daily with a meal.    Yes [provider]  nebivolol (BYSTOLIC) 10 MG tablet Take by mouth. 06/29/19 06/28/20 Yes [provider]  olmesartan (BENICAR) 5 MG tablet Take 10 mg by mouth daily.    Yes [provider]  omeprazole (PRILOSEC) 40 MG capsule Take 40 mg by mouth daily.   Yes [provider]  rosuvastatin (CRESTOR) 20 MG tablet  12/02/17  Yes [provider]  sitaGLIPtin (JANUVIA) 100 MG tablet Take 100 mg by mouth daily.   Yes [provider]  triamcinolone (KENALOG) 0.025 % cream APPLY  ON LEGS DAILY 06/23/19  Yes [provider]  doxycycline (VIBRAMYCIN) 100 MG capsule Take 1 capsule (100 mg total) by mouth 2 (two) times daily. 07/30/19   Coral Spikes, DO   Social History Social History   Tobacco Use  . Smoking status: Never Smoker  . Smokeless tobacco: Never Used  Substance Use Topics  . Alcohol use: No  . Drug use: No     Allergies   Isosorbide   Review of Systems Review of Systems  Constitutional: Negative for fever.  Respiratory: Negative for shortness of breath.   Cardiovascular: Positive for leg swelling.    Skin:       Left lower extremity edema, redness, warmth.   Physical Exam Triage Vital Signs ED Triage Vitals  Enc Vitals Group     BP 07/30/19 1224 130/69     Pulse Rate 07/30/19 1224 82     Resp 07/30/19 1224 18     Temp 07/30/19 1224 99.5 F (37.5 C)     Temp Source 07/30/19 1224 Oral     SpO2 07/30/19 1224 97 %     Weight 07/30/19 1218 275 lb (124.7 kg)     Height 07/30/19 1218 6\' 1"  (1.854 m)     Head Circumference --      Peak Flow --      Pain Score 07/30/19 1218 4     Pain Loc --      Pain Edu? --      Excl. in [place]? --    Updated Vital Signs BP 130/69 (BP Location: Right Arm)   Pulse 82   Temp 99.5 F (37.5 C) (Oral)   Resp 18   Ht 6\' 1"  (1.854 m)   Wt 124.7 kg   SpO2 97%   BMI 36.28 kg/m   Visual Acuity Right Eye Distance:   Left Eye Distance:   Bilateral Distance:    Right Eye Near:   Left Eye Near:    Bilateral Near:     Physical Exam Vitals and nursing note reviewed.  Constitutional:      General: He is not in acute distress.    Appearance: Normal appearance. He is obese. He is not ill-appearing.  HENT:     Head: Normocephalic and atraumatic.  Eyes:     General:        Right eye: No discharge.        Left eye: No discharge.     Conjunctiva/sclera: Conjunctivae normal.  Cardiovascular:     Rate and Rhythm: Normal rate and regular rhythm.     Heart sounds: No murmur.  Pulmonary:     Effort: Pulmonary effort is normal.     Breath sounds: Normal breath sounds. No wheezing, rhonchi or rales.  Musculoskeletal:     Left lower leg: Edema present.  Skin:    Comments: Left lower extremity with edema, erythema, and warmth.  Neurological:     Mental Status: He is alert.  Psychiatric:        Mood and Affect: Mood normal.        Behavior: Behavior normal.      UC Treatments / Results  Labs (all labs ordered are listed, but only abnormal results are displayed) Labs Reviewed - No data to display  EKG   Radiology No results  found.  Procedures Procedures (including critical care time)  Medications Ordered in UC Medications - No data to display  Initial Impression / Assessment and Plan / UC Course  I  have reviewed the triage vital signs and the nursing notes.  Pertinent labs & imaging results that were available during my care of the patient were reviewed by me and considered in my medical decision making (see chart for details).    73 year old male presents with cellulitis of the left lower extremity in the setting of chronic venous insufficiency.  Doxycycline.  Supportive care.  Final Clinical Impressions(s) / UC Diagnoses   Final diagnoses:  Cellulitis of left lower extremity  Venous insufficiency of left leg     Discharge Instructions     Rest.  Elevate leg.  Antibiotic as prescribed.  After improves can return back to wearing compression stocking.  Take care  Dr. Lacinda Axon    ED Prescriptions    Medication Sig Dispense Auth. Provider   doxycycline (VIBRAMYCIN) 100 MG capsule Take 1 capsule (100 mg total) by mouth 2 (two) times daily. 20 capsule Coral Spikes, DO     PDMP not reviewed this encounter.   Coral Spikes, [place] 07/30/19 1338

## 2019-07-30 NOTE — Discharge Instructions (Signed)
Rest.  Elevate leg.  Antibiotic as prescribed.  After improves can return back to wearing compression stocking.  Take care  Dr. Lacinda Axon

## 2019-07-30 NOTE — ED Triage Notes (Signed)
Patient c/o LLE edema and pain that started a few weeks ago.

## 2019-08-18 ENCOUNTER — Other Ambulatory Visit: Payer: Self-pay

## 2019-08-18 DIAGNOSIS — E7849 Other hyperlipidemia: Secondary | ICD-10-CM | POA: Insufficient documentation

## 2019-08-18 DIAGNOSIS — R350 Frequency of micturition: Secondary | ICD-10-CM

## 2019-08-19 ENCOUNTER — Ambulatory Visit: Payer: Self-pay | Admitting: Urology

## 2019-08-21 ENCOUNTER — Ambulatory Visit: Payer: Medicare HMO | Admitting: Urology

## 2019-08-21 ENCOUNTER — Other Ambulatory Visit: Payer: Self-pay

## 2019-08-21 ENCOUNTER — Other Ambulatory Visit
Admission: RE | Admit: 2019-08-21 | Discharge: 2019-08-21 | Disposition: A | Discharge status: Home / Self Care | Payer: Medicare HMO | Attending: Urology | Admitting: Urology

## 2019-08-21 VITALS — BP 183/98 | HR 83

## 2019-08-21 DIAGNOSIS — R351 Nocturia: Secondary | ICD-10-CM

## 2019-08-21 DIAGNOSIS — Z8639 Personal history of other endocrine, nutritional and metabolic disease: Secondary | ICD-10-CM | POA: Diagnosis not present

## 2019-08-21 DIAGNOSIS — R350 Frequency of micturition: Secondary | ICD-10-CM

## 2019-08-21 LAB — URINALYSIS, COMPLETE (UACMP) WITH MICROSCOPIC
Bacteria, UA: NONE SEEN
Bilirubin Urine: NEGATIVE
Glucose, UA: NEGATIVE mg/dL
Ketones, ur: NEGATIVE mg/dL
Leukocytes,Ua: NEGATIVE
Nitrite: NEGATIVE
Protein, ur: >300 mg/dL — AB
Specific Gravity, Urine: 1.025 (ref 1.005–1.030)
Squamous Epithelial / HPF: NONE SEEN (ref 0–5)
pH: 6 (ref 5.0–8.0)

## 2019-08-21 LAB — BLADDER SCAN AMB NON-IMAGING

## 2019-08-21 NOTE — Progress Notes (Signed)
08/21/2019 9:41 AM   [name] Nov 10, 1945 [id number]  Referring provider: Albina Billet, MD [address]   [place],  [place] [address]  Chief Complaint  Patient presents with  . Urinary Frequency    New Patient    HPI: 74 year old male who presents today to establish care for urinary symptoms and history of hypogonadism.  He reports that he was a patient of [facility] many years ago.  He did not have any switch to Dr. Yves Dill for a few years for insurance reasons would like to reestablish care.  Is not been seen by urologist about 3 years since his cardiac bypass surgery.  He reports he has a personal history of hypogonadism.  He reports he primarily started this due to energy issues.  He was initially on Testopel pellets which worked well.  Interested pain for these any ended up switching over to topical cream.  He is not uses it about 3 years since his heart attack.  He believes his energy level has decreased is not sure if it is related to his hypogonadism or other issues.  He is not sexually active due to his wife having multiple medical comorbidities.  He reports he was primarily treated for hypogonadism for energy issues rather than erections anyway.  In addition to the above, he has had worsening daytime and nighttime urinary frequency of the past several years.  This is chronic but seems to be getting more bothersome.  He reports he likes to go travel ball games with his grandson and this is interfering with his ability to travel and watch the full game.  He voids up to every 15 minutes sometimes during the day and gets up sometimes as frequently as once an hour at night to void.   He denies any significant obstructive urinary symptoms including no significant weak stream, feels like he empties his bladder completely, no history of retention.  No history of UTIs, no dysuria or gross hematuria.  Is not currently on any BPH medications is never taken such  medications in the past.   He does have multiple medical comorbidities likely contributing to his urinary symptoms including a personal history of diabetes, sleep apnea for which he uses a CPAP, and chronic lower extremity edema.  He no longer takes Lasix.  He does note today that he is uncircumcised.  He did not pull back or clean his foreskin prior to giving a urine.  IPSS    Row Name 08/21/19 0900         International Prostate Symptom Score   How often have you had the sensation of not emptying your bladder?  Less than 1 in 5     How often have you had to urinate less than every two hours?  More than half the time     How often have you found you stopped and started again several times when you urinated?  Less than 1 in 5 times     How often have you found it difficult to postpone urination?  Less than 1 in 5 times     How often have you had a weak urinary stream?  Less than half the time     How often have you had to strain to start urination?  Less than 1 in 5 times     How many times did you typically get up at night to urinate?  3 Times     Total IPSS Score  13  Quality of Life due to urinary symptoms   If you were to spend the rest of your life with your urinary condition just the way it is now how would you feel about that?  Mixed        Score:  1-7 Mild 8-19 Moderate 20-35 Severe    PMH: Past Medical History:  Diagnosis Date  . Arthritis   . Coronary artery disease   . Diabetes mellitus without complication ([place])   . GERD (gastroesophageal reflux disease)   . Sleep apnea     Surgical History: Past Surgical History:  Procedure Laterality Date  . APPENDECTOMY    . CARDIAC CATHETERIZATION Left 08/16/2016   Procedure: Left Heart Cath and Coronary Angiography;  Surgeon: Yolonda Kida, MD;  Location: [facility];  Service: Cardiovascular;  Laterality: Left;  . COLON SURGERY    . CORONARY ARTERY BYPASS GRAFT  08/30/2016   triple  . LEFT HEART  CATH AND CORS/GRAFTS ANGIOGRAPHY Left 11/20/2016   Procedure: Left Heart Cath and Cors/Grafts Angiography;  Surgeon: Yolonda Kida, MD;  Location: [facility];  Service: Cardiovascular;  Laterality: Left;    Home Medications:  Allergies as of 08/21/2019      Reactions   Isosorbide Cough      Medication List       Accurate as of August 21, 2019  9:41 AM. If you have any questions, ask your nurse or doctor.        STOP taking these medications   doxycycline 100 MG capsule Commonly known as: VIBRAMYCIN Stopped by: Hollice Espy, MD     TAKE these medications   amLODipine 5 MG tablet Commonly known as: NORVASC Take 1 tablet (5 mg total) by mouth daily. What changed: how much to take   aspirin EC 81 MG tablet Take 81 mg by mouth daily.   clopidogrel 75 MG tablet Commonly known as: PLAVIX   insulin glargine 100 UNIT/ML injection Commonly known as: LANTUS Inject 18 Units into the skin at bedtime.   lisinopril 5 MG tablet Commonly known as: ZESTRIL Take by mouth.   metFORMIN 1000 MG tablet Commonly known as: GLUCOPHAGE Take 500 mg by mouth 2 (two) times daily with a meal.   nebivolol 10 MG tablet Commonly known as: BYSTOLIC Take by mouth.   olmesartan 5 MG tablet Commonly known as: BENICAR Take 10 mg by mouth daily.   omeprazole 40 MG capsule Commonly known as: PRILOSEC Take 40 mg by mouth daily.   rosuvastatin 20 MG tablet Commonly known as: CRESTOR   sitaGLIPtin 100 MG tablet Commonly known as: JANUVIA Take 100 mg by mouth daily.   triamcinolone 0.025 % cream Commonly known as: KENALOG APPLY ON LEGS DAILY       Allergies:  Allergies  Allergen Reactions  . Isosorbide Cough    Family History: No family history on file.  Social History:  reports that he has never smoked. He has never used smokeless tobacco. He reports that he does not drink alcohol or use drugs.  ROS: UROLOGY Frequent Urination?: Yes Hard to postpone urination?:  No Burning/pain with urination?: No Get up at night to urinate?: Yes Leakage of urine?: No Urine stream starts and stops?: No Trouble starting stream?: No Do you have to strain to urinate?: No Blood in urine?: No Urinary tract infection?: No Sexually transmitted disease?: No Injury to kidneys or bladder?: No Painful intercourse?: No Weak stream?: No Erection problems?: No Penile pain?: No  Gastrointestinal Nausea?:  No Vomiting?: No Indigestion/heartburn?: No Diarrhea?: No Constipation?: No  Constitutional Fever: No Night sweats?: No Weight loss?: No Fatigue?: No  Skin Skin rash/lesions?: No Itching?: No  Eyes Blurred vision?: No Double vision?: No  Ears/Nose/Throat Sore throat?: No Sinus problems?: No  Hematologic/Lymphatic Swollen glands?: No Easy bruising?: No  Cardiovascular Leg swelling?: No Chest pain?: No  Respiratory Cough?: No Shortness of breath?: No  Endocrine Excessive thirst?: No  Musculoskeletal Back pain?: No Joint pain?: No  Neurological Headaches?: No Dizziness?: No  Psychologic Depression?: No Anxiety?: No  Physical Exam: BP (!) 183/98   Pulse 83   Constitutional:  Alert and oriented, No acute distress. HEENT: [place] AT, moist mucus membranes.  Trachea midline, no masses. Cardiovascular: No clubbing, cyanosis, or edema. Respiratory: Normal respiratory effort, no increased work of breathing. GI: Abdomen is soft, nontender, nondistended, no abdominal masses Rectal: External hemorrhoids appreciated.  Normal sphincter tone.  50 cc prostate, nontender, no nodules. Extremity: Left pitting edema with venous stasis changes.  Right-sided edema, 1+ of the lower extremity. Skin: No rashes, bruises or suspicious lesions. Neurologic: Grossly intact, no focal deficits, moving all 4 extremities. Psychiatric: Normal mood and affect.  Laboratory Data: Lab Results  Component Value Date   WBC 6.9 02/11/2017   HGB 13.2 02/11/2017   HCT  40.8 02/11/2017   MCV 78.9 (L) 02/11/2017   PLT 187 02/11/2017    Lab Results  Component Value Date   CREATININE 0.90 02/11/2017    Urinalysis Results for orders placed or performed in visit on 08/21/19  Bladder Scan (Post Void Residual) in office  Result Value Ref Range   Scan Result 85ml    Component     Latest Ref Rng & Units 08/21/2019  Color, Urine     YELLOW YELLOW  Appearance     CLEAR CLEAR  Specific Gravity, Urine     1.005 - 1.030 1.025  pH     5.0 - 8.0 6.0  Glucose, UA     NEGATIVE mg/dL NEGATIVE  Hgb urine dipstick     NEGATIVE TRACE (A)  Bilirubin Urine     NEGATIVE NEGATIVE  Ketones, ur     NEGATIVE mg/dL NEGATIVE  Protein     NEGATIVE mg/dL >300 (A)  Nitrite     NEGATIVE NEGATIVE  Leukocytes,Ua     NEGATIVE NEGATIVE  Squamous Epithelial / LPF     0 - 5 NONE SEEN  WBC, UA     0 - 5 WBC/hpf 6-10  RBC / HPF     0 - 5 RBC/hpf 6-10  Bacteria, UA     NONE SEEN NONE SEEN  Mucus      PRESENT  Hyaline Casts, UA      PRESENT  Granular Casts, UA      PRESENT    Assessment & Plan:    1. Urinary frequency Suspect this is likely multifactorial given his medical comorbidities lower extremity edema, etc.  There certainly is likely a component of overactive bladder.  He is bothered enough by this that he is interested in further treatment.  Samples of Myrbetriq 25 mg x 4 weeks given today, will reassess symptoms based on his response to this medication.  No significant obstructive urinary symptoms, unclear whether or not the prostate is playing a role in his urinary symptoms.  Behavioral modification was also reviewed.  Do not suspect urinary tract infection, pyuria and microscopic blood likely related to unretracted, unclean foreskin.  Will recheck urine at next visit  with retracted foreskin.  PSA screening today  - Bladder Scan (Post Void Residual) in office - Testosterone; Future - PSA; Future - Urine culture; Future  2. Nocturia Likely  multifactorial as above  3. History of hypogonadism Patient is interested in resuming testosterone therapy  Need to document that he continues to have hypogonadism given that he spent 3 years since his last treatment  We will plan for early morning labs x2 and have him follow-up with [facility] to discuss his treatment options should he qualify for therapy   Return in about 6 months (around 02/18/2020) for AM labs in [place], f/u shannon .  Hollice Espy, MD  [facility] [address], [place], [place] [address] [phone]

## 2019-08-22 LAB — URINE CULTURE: Culture: <10000 — AB

## 2019-08-25 ENCOUNTER — Other Ambulatory Visit: Payer: Self-pay

## 2019-08-25 ENCOUNTER — Other Ambulatory Visit
Admission: RE | Admit: 2019-08-25 | Discharge: 2019-08-25 | Disposition: A | Discharge status: Home / Self Care | Payer: Medicare HMO | Attending: Urology | Admitting: Urology

## 2019-08-25 DIAGNOSIS — R350 Frequency of micturition: Secondary | ICD-10-CM | POA: Diagnosis present

## 2019-08-25 LAB — PSA: Prostatic Specific Antigen: 0.4 ng/mL (ref 0.00–4.00)

## 2019-08-25 NOTE — Addendum Note (Signed)
Addended by: Elmo Putt on: 08/25/2019 08:38 AM   Modules accepted: Orders

## 2019-08-26 ENCOUNTER — Telehealth: Payer: Self-pay | Admitting: *Deleted

## 2019-08-26 DIAGNOSIS — Z8639 Personal history of other endocrine, nutritional and metabolic disease: Secondary | ICD-10-CM

## 2019-08-26 LAB — TESTOSTERONE: Testosterone: 139 ng/dL — ABNORMAL LOW (ref 264–916)

## 2019-08-26 NOTE — Telephone Encounter (Addendum)
Patient informed-scheduled appointment for [facility] in one month in [place]. Orders placed.   ----- Message from Hollice Espy, MD sent at 08/26/2019  3:36 PM EST ----- We need one more testosterone, early AM, to document hypogonadism.  Please order and scheduled.  PSA is fine.  He was actually supposed to be scheduled with Larene [facility] in 1 MONTH not 6, please reschedule.

## 2019-08-27 ENCOUNTER — Telehealth: Payer: Self-pay | Admitting: *Deleted

## 2019-08-27 NOTE — Telephone Encounter (Signed)
-----   Message from Hollice Espy, MD sent at 08/26/2019  3:36 PM EST ----- We need one more testosterone, early AM, to document hypogonadism.  Please order and scheduled.  PSA is fine.  He was actually supposed to be scheduled with Larene [facility] in 1 MONTH not 6, please reschedule.

## 2019-09-21 ENCOUNTER — Other Ambulatory Visit
Admission: RE | Admit: 2019-09-21 | Discharge: 2019-09-21 | Disposition: A | Discharge status: Home / Self Care | Payer: Medicare HMO | Attending: Urology | Admitting: Urology

## 2019-09-21 DIAGNOSIS — Z8639 Personal history of other endocrine, nutritional and metabolic disease: Secondary | ICD-10-CM | POA: Diagnosis present

## 2019-09-22 LAB — TESTOSTERONE: Testosterone: 135 ng/dL — ABNORMAL LOW (ref 264–916)

## 2019-09-28 ENCOUNTER — Telehealth: Payer: Self-pay | Admitting: Urology

## 2019-09-28 ENCOUNTER — Ambulatory Visit: Payer: Medicare HMO | Admitting: Urology

## 2019-09-28 ENCOUNTER — Encounter: Payer: Self-pay | Admitting: Urology

## 2019-09-28 ENCOUNTER — Other Ambulatory Visit
Admission: RE | Admit: 2019-09-28 | Discharge: 2019-09-28 | Disposition: A | Discharge status: Home / Self Care | Payer: Medicare HMO | Attending: Urology | Admitting: Urology

## 2019-09-28 ENCOUNTER — Other Ambulatory Visit: Payer: Self-pay | Admitting: Family Medicine

## 2019-09-28 ENCOUNTER — Other Ambulatory Visit: Payer: Self-pay

## 2019-09-28 VITALS — BP 166/92 | HR 86 | Ht 73.0 in | Wt 272.0 lb

## 2019-09-28 DIAGNOSIS — E349 Endocrine disorder, unspecified: Secondary | ICD-10-CM

## 2019-09-28 DIAGNOSIS — R399 Unspecified symptoms and signs involving the genitourinary system: Secondary | ICD-10-CM | POA: Diagnosis not present

## 2019-09-28 DIAGNOSIS — Z8639 Personal history of other endocrine, nutritional and metabolic disease: Secondary | ICD-10-CM | POA: Insufficient documentation

## 2019-09-28 DIAGNOSIS — R350 Frequency of micturition: Secondary | ICD-10-CM | POA: Diagnosis present

## 2019-09-28 DIAGNOSIS — N529 Male erectile dysfunction, unspecified: Secondary | ICD-10-CM | POA: Diagnosis not present

## 2019-09-28 LAB — URINALYSIS, COMPLETE (UACMP) WITH MICROSCOPIC
Bilirubin Urine: NEGATIVE
Glucose, UA: NEGATIVE mg/dL
Ketones, ur: NEGATIVE mg/dL
Leukocytes,Ua: NEGATIVE
Nitrite: NEGATIVE
Protein, ur: >300 mg/dL — AB
Specific Gravity, Urine: 1.025 (ref 1.005–1.030)
Squamous Epithelial / HPF: NONE SEEN (ref 0–5)
pH: 6 (ref 5.0–8.0)

## 2019-09-28 LAB — BLADDER SCAN AMB NON-IMAGING: Scan Result: 24

## 2019-09-28 MED ORDER — TOVIAZ 8 MG PO TB24: 8.0000 mg | ORAL_TABLET | Freq: Every day | ORAL | 0 refills | Status: DC

## 2019-09-28 NOTE — Telephone Encounter (Signed)
We need cardiac clearance and insurance clearance for his Testopel insertion.

## 2019-09-28 NOTE — Progress Notes (Signed)
09/28/2019 12:16 PM   [name] Mar 27, 1946 [id number]  Referring provider: Albina Billet, MD [address]   [place],  [place] [address]  Chief Complaint  Patient presents with  . Hypogonadism    HPI: [name] is a 74 year old male with testosterone deficiency, urinary frequency and nocturia who presents today for a one month follow up.  Testosterone deficiency Patient is experiencing a decrease in libido, a lack of energy, a decrease in strength, a decreased enjoyment in life and erections being less strong.  This is indicated by his responses to the ADAM questionnaire. He is no longer having spontaneous erections at night.  He has sleep apnea and is sleeping with a CPAP machine.  He is reporting a loss of body hair, obesity and type 2 diabetes mellitus.    His pretreatment testosterone level was 139 ng/dL on 08/25/2019 (afternoon draw) and 135 ng/dL on 09/21/2019 (morning draw).  Today's testosterone is pending.    He was a previous patient of Dr. Letta Kocher and has had testosterone pellets and topical gels.    Androgen Deficiency in the Aging Male    [place] Name 09/28/19 1100         Androgen Deficiency in the Aging Male   Do you have a decrease in libido (sex drive)  Yes     Do you have lack of energy  Yes     Do you have a decrease in strength and/or endurance  Yes     Have you lost height  No     Have you noticed a decreased "enjoyment of life"  Yes     Are you sad and/or grumpy  No     Are your erections less strong  Yes     Have you noticed a recent deterioration in your ability to play sports  Yes     Are you falling asleep after dinner  No     Has there been a recent deterioration in your work performance  No         BPH WITH LUTS  (prostate and/or bladder) IPSS score: 14/3    PVR: 24 mL   Previous I PSS: 13/3   Previous PVR: 48 mL  Major complaint(s):  Incomplete emptying, frequency, intermittency, urgency, weak urinary stream, straining to urinate  and nocturia x 2 for ten years. Denies any dysuria, hematuria or suprapubic pain.   Currently taking: Myrbetriq 25 mg daily.  He did not noticed any improvement in his urinary symptoms.    His has had urethroplasty as a child.     Denies any recent fevers, chills, nausea or vomiting.  He does not have a family history of PCa.  IPSS    Row Name 09/28/19 1100         International Prostate Symptom Score   How often have you had the sensation of not emptying your bladder?  About half the time     How often have you had to urinate less than every two hours?  About half the time     How often have you found you stopped and started again several times when you urinated?  Less than 1 in 5 times     How often have you found it difficult to postpone urination?  About half the time     How often have you had a weak urinary stream?  Less than 1 in 5 times     How often have  you had to strain to start urination?  Less than 1 in 5 times     How many times did you typically get up at night to urinate?  2 Times     Total IPSS Score  14       Quality of Life due to urinary symptoms   If you were to spend the rest of your life with your urinary condition just the way it is now how would you feel about that?  Mixed        Score:  1-7 Mild 8-19 Moderate 20-35 Severe   Erectile dysfunction SHIM score: 5     Main complaint: no erections x several years Risk factors:  age, BPH, testosterone deficiency, DM, HTN, HLD, sleep apnea and CAD. No painful erections or curvatures with his erections.    No longer having spontaneous erections.   SHIM    Row Name 09/28/19 1148         SHIM: Over the last 6 months:   How do you rate your confidence that you could get and keep an erection?  Very Low     When you had erections with sexual stimulation, how often were your erections hard enough for penetration (entering your partner)?  Almost Never or Never     During sexual intercourse, how often were  you able to maintain your erection after you had penetrated (entered) your partner?  Almost Never or Never     During sexual intercourse, how difficult was it to maintain your erection to completion of intercourse?  Extremely Difficult     When you attempted sexual intercourse, how often was it satisfactory for you?  Almost Never or Never       SHIM Total Score   SHIM  5        Score: 1-7 Severe ED 8-11 Moderate ED 12-16 Mild-Moderate ED 17-21 Mild ED 22-25 No ED  PMH: Past Medical History:  Diagnosis Date  . Arthritis   . Coronary artery disease   . Diabetes mellitus without complication ([place])   . GERD (gastroesophageal reflux disease)   . Sleep apnea     Surgical History: Past Surgical History:  Procedure Laterality Date  . APPENDECTOMY    . CARDIAC CATHETERIZATION Left 08/16/2016   Procedure: Left Heart Cath and Coronary Angiography;  Surgeon: Yolonda Kida, MD;  Location: [facility];  Service: Cardiovascular;  Laterality: Left;  . COLON SURGERY    . CORONARY ARTERY BYPASS GRAFT  08/30/2016   triple  . LEFT HEART CATH AND CORS/GRAFTS ANGIOGRAPHY Left 11/20/2016   Procedure: Left Heart Cath and Cors/Grafts Angiography;  Surgeon: Yolonda Kida, MD;  Location: [facility];  Service: Cardiovascular;  Laterality: Left;    Home Medications:  Allergies as of 09/28/2019      Reactions   Isosorbide Cough      Medication List       Accurate as of September 28, 2019 12:16 PM. If you have any questions, ask your nurse or doctor.        amLODipine 5 MG tablet Commonly known as: NORVASC Take 1 tablet (5 mg total) by mouth daily. What changed: how much to take   aspirin EC 81 MG tablet Take 81 mg by mouth daily.   clopidogrel 75 MG tablet Commonly known as: PLAVIX   insulin glargine 100 UNIT/ML injection Commonly known as: LANTUS Inject 18 Units into the skin at bedtime.   lisinopril 5 MG tablet Commonly  known as: ZESTRIL Take by mouth.    metFORMIN 1000 MG tablet Commonly known as: GLUCOPHAGE Take 500 mg by mouth 2 (two) times daily with a meal.   nebivolol 10 MG tablet Commonly known as: BYSTOLIC Take by mouth.   olmesartan 5 MG tablet Commonly known as: BENICAR Take 10 mg by mouth daily.   omeprazole 40 MG capsule Commonly known as: PRILOSEC Take 40 mg by mouth daily.   rosuvastatin 20 MG tablet Commonly known as: CRESTOR   sitaGLIPtin 100 MG tablet Commonly known as: JANUVIA Take 100 mg by mouth daily.   triamcinolone 0.025 % cream Commonly known as: KENALOG APPLY ON LEGS DAILY       Allergies:  Allergies  Allergen Reactions  . Isosorbide Cough    Family History: History reviewed. No pertinent family history.  Social History:  reports that he has never smoked. He has never used smokeless tobacco. He reports that he does not drink alcohol or use drugs.  ROS: Pertinent ROS in HPI  Physical Exam: BP (!) 166/92   Pulse 86   Ht 6\' 1"  (1.854 m)   Wt 272 lb (123.4 kg)   BMI 35.89 kg/m   Constitutional:  Well nourished. Alert and oriented, No acute distress. HEENT: [place] AT, mask in place.  Trachea midline, no masses. Cardiovascular: No clubbing, cyanosis, or edema. Respiratory: Normal respiratory effort, no increased work of breathing. Neurologic: Grossly intact, no focal deficits, moving all 4 extremities. Psychiatric: Normal mood and affect.  Laboratory Data: Component     Latest Ref Rng & Units 08/25/2019  Prostatic Specific Antigen     0.00 - 4.00 ng/mL 0.40   Lab Results  Component Value Date   WBC 6.9 02/11/2017   HGB 13.2 02/11/2017   HCT 40.8 02/11/2017   MCV 78.9 (L) 02/11/2017   PLT 187 02/11/2017    Lab Results  Component Value Date   CREATININE 0.90 02/11/2017    Lab Results  Component Value Date   TESTOSTERONE 135 (L) 09/21/2019    Lab Results  Component Value Date   AST 23 02/11/2017   Lab Results  Component Value Date   ALT 17 02/11/2017     Urinalysis Component     Latest Ref Rng & Units 09/28/2019  Color, Urine     YELLOW YELLOW  Appearance     CLEAR CLEAR  Specific Gravity, Urine     1.005 - 1.030 1.025  pH     5.0 - 8.0 6.0  Glucose, UA     NEGATIVE mg/dL NEGATIVE  Hgb urine dipstick     NEGATIVE TRACE (A)  Bilirubin Urine     NEGATIVE NEGATIVE  Ketones, ur     NEGATIVE mg/dL NEGATIVE  Protein     NEGATIVE mg/dL >300 (A)  Nitrite     NEGATIVE NEGATIVE  Leukocytes,Ua     NEGATIVE NEGATIVE  Squamous Epithelial / LPF     0 - 5 NONE SEEN  WBC, UA     0 - 5 WBC/hpf 0-5  RBC / HPF     0 - 5 RBC/hpf 0-5  Bacteria, UA     NONE SEEN RARE (A)  Mucus      PRESENT  Hyaline Casts, UA      PRESENT    I have reviewed the labs.   Pertinent Imaging: Results for SHINE, ANTAL (MRN [id number]) as of 09/28/2019 13:19  Ref. Range 09/28/2019 11:49  Scan Result Unknown 24   I have independently  reviewed the films.    Assessment & Plan:    1. Testosterone deficiency Explained to patient that the current guidelines from the [employer] reports the diagnosis of hypogonadism requires a morning serum total testosterone level at least 2 days apart that is below 300 ng/dL - most insurances require the blood work to be done before 9 am - today's testosterone is pending Advised patient that due to his cardiac history we will need clearance from Dr. Clayborn Bigness to begin testosterone treatments He prefers the Testopel pellets, so if his insurance will cover the insertion we will schedule- if not we will prescribe the gels and check his testosterone level in one month  2. BPH with LUTS IPSS score is 14/3, it is stable Continue conservative management, avoiding bladder irritants and timed voiding's Most bothersome symptoms is/are frequency and urgency I have given him Toviaz 8 mg daily, #28 samples - advised of side effects of dry mouth, constipation, dry eyes and cognitive changes RTC in one month for I PSS and PVR   3.  Erectile dysfunction SHIM score is 5  Not interested in treatment at this time  4. Proteinuria Advised to notify PCP   Return for pending labs, insurance and cardiac clearance .  These notes generated with voice recognition software. I apologize for typographical errors.  Zara Council, PA-C  [facility] [address]  [place], [place] [address] [phone]

## 2019-09-29 ENCOUNTER — Telehealth: Payer: Self-pay | Admitting: Family Medicine

## 2019-09-29 LAB — TESTOSTERONE: Testosterone: 154 ng/dL — ABNORMAL LOW (ref 264–916)

## 2019-09-29 NOTE — Telephone Encounter (Signed)
-----   Message from Nori Riis, PA-C sent at 09/29/2019  7:59 AM EST ----- Please let Mr. Barb know that his second testosterone returned low as expected.  We just need Dr. Etta Quill clearance and the okay from your insurance.

## 2019-09-29 NOTE — Progress Notes (Signed)
Please let [name] know that his second testosterone returned low as expected.  We just need Dr. Etta Quill clearance and the okay from your insurance.

## 2019-09-29 NOTE — Telephone Encounter (Signed)
LMOM for patient to return call.

## 2019-09-30 NOTE — Telephone Encounter (Signed)
Patient returned call.  He was notified of his testosterone lab results.   He would like to proceed with testosterone therapy.  Please proceed with the following:    1) send cardiac clearance form to Dr. Clayborn Bigness 2) start prior authorization with his insurance  Keep him updated on the progress  Thank you

## 2019-10-02 NOTE — Telephone Encounter (Signed)
Cardiac clearance has been faxed, waiting response to start PA.

## 2019-10-15 NOTE — Telephone Encounter (Signed)
Received clearance from cardiology.

## 2019-10-16 ENCOUNTER — Telehealth: Payer: Self-pay | Admitting: Family Medicine

## 2019-10-16 NOTE — Telephone Encounter (Signed)
LMOM for patient to return call. Need to go over Authorization for Testopel.

## 2019-10-16 NOTE — Telephone Encounter (Signed)
Pt returned your call, asks that you call him back on his house number.

## 2019-10-20 NOTE — Telephone Encounter (Signed)
Spoke with patient and informed him of approval for Testopel. He is going to find out how much his out of pocket will be before he decides. I gave him the cost and CPT numbers so he can ask insurance.

## 2019-11-23 ENCOUNTER — Other Ambulatory Visit: Payer: Self-pay | Admitting: Urology

## 2019-11-23 ENCOUNTER — Telehealth: Payer: Self-pay

## 2019-11-23 MED ORDER — TESTOSTERONE 20.25 MG/1.25GM (1.62%) TD GEL: 2.0000 | Freq: Every day | TRANSDERMAL | 0 refills | Status: DC

## 2019-11-23 NOTE — Telephone Encounter (Signed)
Incoming call from pt who states that he was debating on testopel but has decided that is too expensive. He would like to resume using testosterone gel. He requests a 90 day be sent to [facility] if possible.

## 2019-11-23 NOTE — Telephone Encounter (Signed)
I have sent a prescription in for the AndroGel 1.62%.  He will apply two pumps daily to clean,dry and intact skin.   He should not place it on his face or scrotum.  He should be aware of transference issues and not have bare skin contact with children or women of child bearing age.  We need to check a testosterone level in one month, four hours after applying the gel.

## 2019-11-23 NOTE — Telephone Encounter (Signed)
Patient called back and was given a reminder of codes to contact the insurance company. States he will be calling them and calling us back.

## 2019-11-24 NOTE — Telephone Encounter (Signed)
Pt aware.

## 2019-11-26 ENCOUNTER — Telehealth: Payer: Self-pay | Admitting: Family Medicine

## 2019-11-26 NOTE — Telephone Encounter (Signed)
   Testosterone gel has been approved  Case ID: [id number] 10/26/2019-11/24/2020

## 2020-02-22 ENCOUNTER — Ambulatory Visit: Payer: Medicare HMO | Admitting: Urology

## 2020-03-04 ENCOUNTER — Other Ambulatory Visit: Payer: Self-pay

## 2020-03-04 ENCOUNTER — Other Ambulatory Visit
Admission: RE | Admit: 2020-03-04 | Discharge: 2020-03-04 | Disposition: A | Discharge status: Home / Self Care | Payer: Medicare HMO | Attending: Urology | Admitting: Urology

## 2020-03-04 DIAGNOSIS — E349 Endocrine disorder, unspecified: Secondary | ICD-10-CM | POA: Diagnosis present

## 2020-03-04 DIAGNOSIS — R399 Unspecified symptoms and signs involving the genitourinary system: Secondary | ICD-10-CM | POA: Diagnosis present

## 2020-03-04 LAB — HEMOGLOBIN AND HEMATOCRIT, BLOOD
HCT: 34.2 % — ABNORMAL LOW (ref 39.0–52.0)
Hemoglobin: 10.7 g/dL — ABNORMAL LOW (ref 13.0–17.0)

## 2020-03-05 LAB — PSA: Prostatic Specific Antigen: 0.49 ng/mL (ref 0.00–4.00)

## 2020-03-05 LAB — TESTOSTERONE: Testosterone: 365 ng/dL (ref 264–916)

## 2020-03-06 NOTE — Progress Notes (Signed)
03/07/2020 2:17 PM   [name] [birth year] [id number]  Referring provider: Albina Billet, MD [address]   [place],  [place] [address]  Chief Complaint  Patient presents with  . Follow-up    48mth follow-up    HPI: [name] is a 74 year old male with testosterone deficiency, urinary frequency and nocturia who presents today for a 6 month follow up.  Testosterone deficiency He is no longer having spontaneous erections at night.  He has sleep apnea and is not sleeping with a CPAP machine.  He is reporting a loss of body hair, obesity and type 2 diabetes mellitus.  He is currently managing his testosterone deficiency with testosterone topical 1.62%, 2 pumps daily.    Component     Latest Ref Rng & Units 03/04/2020  Testosterone     264 - 916 ng/dL 365   Component     Latest Ref Rng & Units 03/04/2020  Hemoglobin     13.0 - 17.0 g/dL 10.7 (L)  HCT     39 - 52 % 34.2 (L)    He denies any passage of blood in stool, hematic emesis, gross hematuria or dark tarry stools.  He states it has been a while since he had a colonoscopy.  He is likely to undergo a cardiac cath in the future.  BPH WITH LUTS  (prostate and/or bladder) IPSS score: 13/2 PVR: 131 mL   Previous I PSS: 14/3  Previous PVR: 24 mL  Major complaint(s):  Frequency - daytime x 4-6 and nocturia x q hour.  Denies any dysuria, hematuria or suprapubic pain.    His has had urethroplasty as a child.     Denies any recent fevers, chills, nausea or vomiting.  He does not have a family history of PCa.   IPSS    Row Name 03/07/20 1300         International Prostate Symptom Score   How often have you had the sensation of not emptying your bladder? Less than 1 in 5     How often have you had to urinate less than every two hours? More than half the time     How often have you found you stopped and started again several times when you urinated? Less than 1 in 5 times     How often have you found it difficult  to postpone urination? Less than half the time     How often have you had a weak urinary stream? Less than 1 in 5 times     How often have you had to strain to start urination? Less than 1 in 5 times     How many times did you typically get up at night to urinate? 3 Times     Total IPSS Score 13       Quality of Life due to urinary symptoms   If you were to spend the rest of your life with your urinary condition just the way it is now how would you feel about that? Mostly Satisfied            Score:  1-7 Mild 8-19 Moderate 20-35 Severe   Erectile dysfunction SHIM score: 5     Main complaint: no erections x several years Risk factors:  age, BPH, testosterone deficiency, DM, HTN, HLD, sleep apnea and CAD. No painful erections or curvatures with his erections.    No longer having spontaneous erections.    PMH: Past  Medical History:  Diagnosis Date  . Arthritis   . Coronary artery disease   . Diabetes mellitus without complication ([place])   . GERD (gastroesophageal reflux disease)   . Sleep apnea     Surgical History: Past Surgical History:  Procedure Laterality Date  . APPENDECTOMY    . CARDIAC CATHETERIZATION Left 08/16/2016   Procedure: Left Heart Cath and Coronary Angiography;  Surgeon: Yolonda Kida, MD;  Location: [facility];  Service: Cardiovascular;  Laterality: Left;  . COLON SURGERY    . CORONARY ARTERY BYPASS GRAFT  08/30/2016   triple  . LEFT HEART CATH AND CORS/GRAFTS ANGIOGRAPHY Left 11/20/2016   Procedure: Left Heart Cath and Cors/Grafts Angiography;  Surgeon: Yolonda Kida, MD;  Location: [facility];  Service: Cardiovascular;  Laterality: Left;    Home Medications:  Allergies as of 03/07/2020      Reactions   Isosorbide Cough      Medication List       Accurate as of March 07, 2020  2:17 PM. If you have any questions, ask your nurse or doctor.        STOP taking these medications   Toviaz 8 MG Tb24 tablet Generic drug:  fesoterodine Stopped by: Zara Council, PA-C     TAKE these medications   amLODipine 5 MG tablet Commonly known as: NORVASC Take 1 tablet (5 mg total) by mouth daily.   aspirin EC 81 MG tablet Take 81 mg by mouth daily.   clopidogrel 75 MG tablet Commonly known as: PLAVIX   insulin glargine 100 UNIT/ML injection Commonly known as: LANTUS Inject 25 Units into the skin at bedtime.   lisinopril 5 MG tablet Commonly known as: ZESTRIL Take by mouth.   metFORMIN 1000 MG tablet Commonly known as: GLUCOPHAGE Take 500 mg by mouth 2 (two) times daily with a meal.   nebivolol 10 MG tablet Commonly known as: BYSTOLIC Take by mouth.   olmesartan 5 MG tablet Commonly known as: BENICAR Take 10 mg by mouth daily.   omeprazole 40 MG capsule Commonly known as: PRILOSEC Take 40 mg by mouth daily.   rosuvastatin 20 MG tablet Commonly known as: CRESTOR   sitaGLIPtin 100 MG tablet Commonly known as: JANUVIA Take 100 mg by mouth daily.   Testosterone 20.25 MG/1.25GM (1.62%) Gel Commonly known as: AndroGel Place 2 Pump onto the skin daily.   triamcinolone 0.025 % cream Commonly known as: KENALOG APPLY ON LEGS DAILY       Allergies:  Allergies  Allergen Reactions  . Isosorbide Cough    Family History: No family history on file.  Social History:  reports that he has never smoked. He has never used smokeless tobacco. He reports that he does not drink alcohol and does not use drugs.  ROS: Pertinent ROS in HPI  Physical Exam: BP (!) 196/78   Pulse 73   Ht 6\' 1"  (1.854 m)   Wt (!) 271 lb (122.9 kg)   BMI 35.75 kg/m   Constitutional:  Well nourished. Alert and oriented, No acute distress. HEENT: [place] AT, mask in place.  Trachea midline Cardiovascular: No clubbing, cyanosis, or edema. Respiratory: Normal respiratory effort, no increased work of breathing. Neurologic: Grossly intact, no focal deficits, moving all 4 extremities. Psychiatric: Normal mood and  affect.  Laboratory Data: Component     Latest Ref Rng & Units 08/25/2019 03/04/2020  Prostatic Specific Antigen     0.00 - 4.00 ng/mL 0.40 0.49   Lab Results  Component Value Date   WBC 6.9 02/11/2017   HGB 10.7 (L) 03/04/2020   HCT 34.2 (L) 03/04/2020   MCV 78.9 (L) 02/11/2017   PLT 187 02/11/2017    Lab Results  Component Value Date   CREATININE 0.90 02/11/2017    Lab Results  Component Value Date   TESTOSTERONE 365 03/04/2020    Lab Results  Component Value Date   AST 23 02/11/2017   Lab Results  Component Value Date   ALT 17 02/11/2017    Urinalysis Component     Latest Ref Rng & Units 09/28/2019  Color, Urine     YELLOW YELLOW  Appearance     CLEAR CLEAR  Specific Gravity, Urine     1.005 - 1.030 1.025  pH     5.0 - 8.0 6.0  Glucose, UA     NEGATIVE mg/dL NEGATIVE  Hgb urine dipstick     NEGATIVE TRACE (A)  Bilirubin Urine     NEGATIVE NEGATIVE  Ketones, ur     NEGATIVE mg/dL NEGATIVE  Protein     NEGATIVE mg/dL >300 (A)  Nitrite     NEGATIVE NEGATIVE  Leukocytes,Ua     NEGATIVE NEGATIVE  Squamous Epithelial / LPF     0 - 5 NONE SEEN  WBC, UA     0 - 5 WBC/hpf 0-5  RBC / HPF     0 - 5 RBC/hpf 0-5  Bacteria, UA     NONE SEEN RARE (A)  Mucus      PRESENT  Hyaline Casts, UA      PRESENT    I have reviewed the labs.   Pertinent Imaging: Results for PATRYK, CONANT (MRN [id number]) as of 03/07/2020 14:01  Ref. Range 03/07/2020 13:37  Scan Result Unknown 131lb    I have independently reviewed the films.    Assessment & Plan:    1. Testosterone deficiency  Testosterone within normal limits Continue AndroGel 1.62%, 2 pumps daily RTC in 6 months for HCT/HBG, testosterone and exam  2. BPH with LUTS IPSS score is 13/2, it is stable Continue conservative management, avoiding bladder irritants and timed voiding's Most bothersome symptom is frequency and nocturia which is definitely worsened due to his comorbidities, but we will have  a trial of Gemtesa 75 mg, # 28 samples given RTC in 6 months for IPSS, PSA, PVR and exam, as testosterone therapy can cause prostate enlargement and worsen LUTS  3. Erectile dysfunction:    SHIM score is 5  4. Anemia Hemoglobin 10.7 Advised him to contact his primary care physician regarding the drop in hemoglobin-he states he has a follow-up with his primary care physician, Dr. Hall Busing, in September, I will fax this note and labs over to his office     Return in about 6 months (around 09/07/2020) for IPSS, PVR and exam, PSA, testosterone  H & H.  These notes generated with voice recognition software. I apologize for typographical errors.  Zara Council, PA-C  [facility] [address]  [place], [place] [address] [phone]

## 2020-03-07 ENCOUNTER — Other Ambulatory Visit: Payer: Self-pay

## 2020-03-07 ENCOUNTER — Encounter: Payer: Self-pay | Admitting: Urology

## 2020-03-07 ENCOUNTER — Ambulatory Visit: Payer: Medicare HMO | Admitting: Urology

## 2020-03-07 VITALS — BP 196/78 | HR 73 | Ht 73.0 in | Wt 271.0 lb

## 2020-03-07 DIAGNOSIS — N529 Male erectile dysfunction, unspecified: Secondary | ICD-10-CM

## 2020-03-07 DIAGNOSIS — D649 Anemia, unspecified: Secondary | ICD-10-CM

## 2020-03-07 DIAGNOSIS — E349 Endocrine disorder, unspecified: Secondary | ICD-10-CM

## 2020-03-07 DIAGNOSIS — N401 Enlarged prostate with lower urinary tract symptoms: Secondary | ICD-10-CM

## 2020-03-07 DIAGNOSIS — N138 Other obstructive and reflux uropathy: Secondary | ICD-10-CM

## 2020-03-07 LAB — BLADDER SCAN AMB NON-IMAGING

## 2020-03-07 MED ORDER — GEMTESA 75 MG PO TABS: 75.0000 mg | ORAL_TABLET | Freq: Every day | ORAL | 0 refills | Status: DC

## 2020-03-08 ENCOUNTER — Other Ambulatory Visit: Payer: Self-pay | Admitting: Urology

## 2020-05-24 ENCOUNTER — Other Ambulatory Visit
Admission: RE | Admit: 2020-05-24 | Discharge: 2020-05-24 | Disposition: A | Discharge status: Home / Self Care | Payer: Medicare HMO | Source: Ambulatory Visit | Attending: Student | Admitting: Student

## 2020-05-24 DIAGNOSIS — Z01818 Encounter for other preprocedural examination: Secondary | ICD-10-CM | POA: Diagnosis present

## 2020-05-24 DIAGNOSIS — I10 Essential (primary) hypertension: Secondary | ICD-10-CM | POA: Insufficient documentation

## 2020-05-24 DIAGNOSIS — I25118 Atherosclerotic heart disease of native coronary artery with other forms of angina pectoris: Secondary | ICD-10-CM | POA: Insufficient documentation

## 2020-05-24 DIAGNOSIS — I440 Atrioventricular block, first degree: Secondary | ICD-10-CM | POA: Insufficient documentation

## 2020-05-24 LAB — BRAIN NATRIURETIC PEPTIDE: B Natriuretic Peptide: 142.4 pg/mL — ABNORMAL HIGH (ref 0.0–100.0)

## 2020-05-27 ENCOUNTER — Other Ambulatory Visit: Payer: Self-pay | Admitting: Urology

## 2020-06-07 ENCOUNTER — Other Ambulatory Visit: Payer: Self-pay

## 2020-06-07 ENCOUNTER — Other Ambulatory Visit
Admission: RE | Admit: 2020-06-07 | Discharge: 2020-06-07 | Disposition: A | Discharge status: Home / Self Care | Payer: Medicare HMO | Source: Ambulatory Visit | Attending: Internal Medicine | Admitting: Internal Medicine

## 2020-06-07 DIAGNOSIS — Z01812 Encounter for preprocedural laboratory examination: Secondary | ICD-10-CM | POA: Insufficient documentation

## 2020-06-07 DIAGNOSIS — Z20822 Contact with and (suspected) exposure to covid-19: Secondary | ICD-10-CM | POA: Insufficient documentation

## 2020-06-07 LAB — SARS CORONAVIRUS 2 (TAT 6-24 HRS): SARS Coronavirus 2: NEGATIVE

## 2020-06-09 ENCOUNTER — Observation Stay
Admission: RE | Admit: 2020-06-09 | Discharge: 2020-06-10 | Disposition: A | Discharge status: Home / Self Care | Payer: Medicare HMO | Attending: Internal Medicine | Admitting: Internal Medicine

## 2020-06-09 ENCOUNTER — Encounter: Payer: Self-pay | Admitting: Internal Medicine

## 2020-06-09 ENCOUNTER — Other Ambulatory Visit: Payer: Self-pay

## 2020-06-09 ENCOUNTER — Encounter
Admission: RE | Disposition: A | Discharge status: Home / Self Care | Payer: Self-pay | Source: Home / Self Care | Attending: Internal Medicine

## 2020-06-09 DIAGNOSIS — G4733 Obstructive sleep apnea (adult) (pediatric): Secondary | ICD-10-CM | POA: Diagnosis not present

## 2020-06-09 DIAGNOSIS — I2510 Atherosclerotic heart disease of native coronary artery without angina pectoris: Secondary | ICD-10-CM | POA: Diagnosis present

## 2020-06-09 DIAGNOSIS — I10 Essential (primary) hypertension: Secondary | ICD-10-CM | POA: Insufficient documentation

## 2020-06-09 DIAGNOSIS — I200 Unstable angina: Principal | ICD-10-CM | POA: Insufficient documentation

## 2020-06-09 DIAGNOSIS — E1159 Type 2 diabetes mellitus with other circulatory complications: Secondary | ICD-10-CM | POA: Insufficient documentation

## 2020-06-09 DIAGNOSIS — Z955 Presence of coronary angioplasty implant and graft: Secondary | ICD-10-CM

## 2020-06-09 HISTORY — PX: LEFT HEART CATH AND CORONARY ANGIOGRAPHY: CATH118249

## 2020-06-09 HISTORY — PX: CORONARY STENT INTERVENTION: CATH118234

## 2020-06-09 LAB — GLUCOSE, CAPILLARY
Glucose-Capillary: 133 mg/dL — ABNORMAL HIGH (ref 70–99)
Glucose-Capillary: 156 mg/dL — ABNORMAL HIGH (ref 70–99)

## 2020-06-09 LAB — POCT ACTIVATED CLOTTING TIME: Activated Clotting Time: 334 s

## 2020-06-09 SURGERY — LEFT HEART CATH AND CORONARY ANGIOGRAPHY
Anesthesia: Moderate Sedation

## 2020-06-09 MED ORDER — HYDRALAZINE HCL 20 MG/ML IJ SOLN
INTRAMUSCULAR | Status: DC | PRN
  Administered 2020-06-09 (×2): 10 mg via INTRAVENOUS

## 2020-06-09 MED ORDER — LABETALOL HCL 5 MG/ML IV SOLN: 10.0000 mg | INTRAVENOUS | Status: AC | PRN

## 2020-06-09 MED ORDER — TICAGRELOR 90 MG PO TABS
90.0000 mg | ORAL_TABLET | Freq: Two times a day (BID) | ORAL | Status: DC
  Administered 2020-06-10 (×2): 90 mg via ORAL
  Filled 2020-06-09 (×2): qty 1

## 2020-06-09 MED ORDER — ONDANSETRON HCL 4 MG/2ML IJ SOLN: 4.0000 mg | Freq: Four times a day (QID) | INTRAMUSCULAR | Status: DC | PRN

## 2020-06-09 MED ORDER — SODIUM CHLORIDE 0.9 % IV SOLN: 250.0000 mL | INTRAVENOUS | Status: DC | PRN

## 2020-06-09 MED ORDER — SODIUM CHLORIDE 0.9 % IV SOLN
0.2500 mg/kg/h | INTRAVENOUS | Status: AC
  Filled 2020-06-09: qty 250

## 2020-06-09 MED ORDER — ASPIRIN 81 MG PO CHEW
81.0000 mg | CHEWABLE_TABLET | Freq: Every day | ORAL | Status: DC
  Administered 2020-06-10: 81 mg via ORAL
  Filled 2020-06-09: qty 1

## 2020-06-09 MED ORDER — SODIUM CHLORIDE 0.9 % IV SOLN
INTRAVENOUS | Status: AC | PRN
  Administered 2020-06-09: 1.75 mg/kg/h via INTRAVENOUS

## 2020-06-09 MED ORDER — ONDANSETRON HCL 4 MG/2ML IJ SOLN
INTRAMUSCULAR | Status: AC
  Filled 2020-06-09: qty 2

## 2020-06-09 MED ORDER — IOHEXOL 300 MG/ML  SOLN
INTRAMUSCULAR | Status: DC | PRN
  Administered 2020-06-09: 291 mL

## 2020-06-09 MED ORDER — LABETALOL HCL 5 MG/ML IV SOLN
INTRAVENOUS | Status: AC
  Filled 2020-06-09: qty 4

## 2020-06-09 MED ORDER — INSULIN GLARGINE 100 UNIT/ML ~~LOC~~ SOLN
18.0000 [IU] | Freq: Every day | SUBCUTANEOUS | Status: DC
  Administered 2020-06-09: 18 [IU] via SUBCUTANEOUS
  Filled 2020-06-09 (×2): qty 0.18

## 2020-06-09 MED ORDER — NEBIVOLOL HCL 10 MG PO TABS
10.0000 mg | ORAL_TABLET | Freq: Every day | ORAL | Status: DC
  Administered 2020-06-10: 10 mg via ORAL
  Filled 2020-06-09 (×2): qty 1

## 2020-06-09 MED ORDER — ISOSORBIDE MONONITRATE ER 60 MG PO TB24
60.0000 mg | ORAL_TABLET | Freq: Every day | ORAL | Status: DC
  Administered 2020-06-10: 60 mg via ORAL
  Filled 2020-06-09: qty 1

## 2020-06-09 MED ORDER — FENTANYL CITRATE (PF) 100 MCG/2ML IJ SOLN
INTRAMUSCULAR | Status: AC
  Filled 2020-06-09: qty 2

## 2020-06-09 MED ORDER — SODIUM CHLORIDE 0.9 % WEIGHT BASED INFUSION
360.0000 mL/h | INTRAVENOUS | Status: AC
  Administered 2020-06-09: 360 mL/h via INTRAVENOUS

## 2020-06-09 MED ORDER — BIVALIRUDIN BOLUS VIA INFUSION - CUPID
INTRAVENOUS | Status: DC | PRN
  Administered 2020-06-09: 90.15 mg via INTRAVENOUS

## 2020-06-09 MED ORDER — ROSUVASTATIN CALCIUM 20 MG PO TABS
40.0000 mg | ORAL_TABLET | Freq: Every day | ORAL | Status: DC
  Filled 2020-06-09 (×2): qty 2

## 2020-06-09 MED ORDER — SODIUM CHLORIDE 0.9% FLUSH: 3.0000 mL | INTRAVENOUS | Status: DC | PRN

## 2020-06-09 MED ORDER — PANTOPRAZOLE SODIUM 40 MG PO TBEC
40.0000 mg | DELAYED_RELEASE_TABLET | Freq: Every day | ORAL | Status: DC
  Administered 2020-06-10: 40 mg via ORAL
  Filled 2020-06-09: qty 1

## 2020-06-09 MED ORDER — SODIUM CHLORIDE 0.9% FLUSH
3.0000 mL | Freq: Two times a day (BID) | INTRAVENOUS | Status: DC
  Administered 2020-06-09 – 2020-06-10 (×3): 3 mL via INTRAVENOUS

## 2020-06-09 MED ORDER — TICAGRELOR 90 MG PO TABS
ORAL_TABLET | ORAL | Status: AC
  Filled 2020-06-09: qty 2

## 2020-06-09 MED ORDER — ASPIRIN 81 MG PO CHEW
CHEWABLE_TABLET | ORAL | Status: DC | PRN
  Administered 2020-06-09: 243 mg via ORAL

## 2020-06-09 MED ORDER — HYDRALAZINE HCL 20 MG/ML IJ SOLN
INTRAMUSCULAR | Status: AC
  Filled 2020-06-09: qty 1

## 2020-06-09 MED ORDER — SODIUM CHLORIDE 0.9% FLUSH
3.0000 mL | Freq: Two times a day (BID) | INTRAVENOUS | Status: DC
  Administered 2020-06-10 (×2): 3 mL via INTRAVENOUS

## 2020-06-09 MED ORDER — INSULIN ASPART 100 UNIT/ML ~~LOC~~ SOLN: 0.0000 [IU] | Freq: Every day | SUBCUTANEOUS | Status: DC

## 2020-06-09 MED ORDER — SODIUM CHLORIDE 0.9 % WEIGHT BASED INFUSION
1.0000 mL/kg/h | INTRAVENOUS | Status: AC
  Administered 2020-06-09 (×2): 1 mL/kg/h via INTRAVENOUS

## 2020-06-09 MED ORDER — BIVALIRUDIN TRIFLUOROACETATE 250 MG IV SOLR
INTRAVENOUS | Status: AC
  Filled 2020-06-09: qty 250

## 2020-06-09 MED ORDER — SODIUM CHLORIDE 0.9 % WEIGHT BASED INFUSION: 120.0000 mL/h | INTRAVENOUS | Status: DC

## 2020-06-09 MED ORDER — FESOTERODINE FUMARATE ER 8 MG PO TB24
8.0000 mg | ORAL_TABLET | Freq: Every day | ORAL | Status: DC
  Administered 2020-06-10: 8 mg via ORAL
  Filled 2020-06-09 (×2): qty 1

## 2020-06-09 MED ORDER — ASPIRIN 81 MG PO CHEW: 81.0000 mg | CHEWABLE_TABLET | ORAL | Status: DC

## 2020-06-09 MED ORDER — TICAGRELOR 90 MG PO TABS
ORAL_TABLET | ORAL | Status: DC | PRN
  Administered 2020-06-09: 180 mg via ORAL

## 2020-06-09 MED ORDER — FENTANYL CITRATE (PF) 100 MCG/2ML IJ SOLN
INTRAMUSCULAR | Status: DC | PRN
  Administered 2020-06-09: 50 ug via INTRAVENOUS

## 2020-06-09 MED ORDER — HYDRALAZINE HCL 20 MG/ML IJ SOLN: 10.0000 mg | INTRAMUSCULAR | Status: AC | PRN

## 2020-06-09 MED ORDER — LINAGLIPTIN 5 MG PO TABS
5.0000 mg | ORAL_TABLET | Freq: Every day | ORAL | Status: DC
  Administered 2020-06-10: 5 mg via ORAL
  Filled 2020-06-09: qty 1

## 2020-06-09 MED ORDER — ACETAMINOPHEN 325 MG PO TABS: 650.0000 mg | ORAL_TABLET | ORAL | Status: DC | PRN

## 2020-06-09 MED ORDER — MIDAZOLAM HCL 2 MG/2ML IJ SOLN
INTRAMUSCULAR | Status: AC
  Filled 2020-06-09: qty 2

## 2020-06-09 MED ORDER — INSULIN ASPART 100 UNIT/ML ~~LOC~~ SOLN
0.0000 [IU] | Freq: Three times a day (TID) | SUBCUTANEOUS | Status: DC
  Administered 2020-06-09: 2 [IU] via SUBCUTANEOUS
  Administered 2020-06-10: 3 [IU] via SUBCUTANEOUS
  Filled 2020-06-09 (×2): qty 1

## 2020-06-09 MED ORDER — HEPARIN (PORCINE) IN NACL 2000-0.9 UNIT/L-% IV SOLN
INTRAVENOUS | Status: DC | PRN
  Administered 2020-06-09: 1000 mL

## 2020-06-09 MED ORDER — IRBESARTAN 150 MG PO TABS
150.0000 mg | ORAL_TABLET | Freq: Every day | ORAL | Status: DC
  Administered 2020-06-10: 150 mg via ORAL
  Filled 2020-06-09: qty 1

## 2020-06-09 MED ORDER — MIDAZOLAM HCL 2 MG/2ML IJ SOLN
INTRAMUSCULAR | Status: DC | PRN
  Administered 2020-06-09: 1 mg via INTRAVENOUS

## 2020-06-09 MED ORDER — LIDOCAINE HCL (PF) 1 % IJ SOLN
INTRAMUSCULAR | Status: AC
  Filled 2020-06-09: qty 30

## 2020-06-09 MED ORDER — LABETALOL HCL 5 MG/ML IV SOLN
INTRAVENOUS | Status: DC | PRN
  Administered 2020-06-09: 10 mg via INTRAVENOUS

## 2020-06-09 MED ORDER — ASPIRIN 81 MG PO CHEW
CHEWABLE_TABLET | ORAL | Status: AC
  Filled 2020-06-09: qty 3

## 2020-06-09 SURGICAL SUPPLY — 18 items
BALLN TREK RX 2.5X20 (BALLOONS) ×4
BALLOON TREK RX 2.5X20 (BALLOONS) ×2 IMPLANT
CATH INFINITI 5 FR MPA2 (CATHETERS) ×4 IMPLANT
CATH INFINITI 5FR JL4 (CATHETERS) ×4 IMPLANT
CATH INFINITI JR4 5F (CATHETERS) ×4 IMPLANT
CATH VISTA GUIDE 6FR MPA1 (CATHETERS) ×4 IMPLANT
DEVICE CLOSURE MYNXGRIP 6/7F (Vascular Products) ×4 IMPLANT
KIT ENCORE 26 ADVANTAGE (KITS) ×4 IMPLANT
NEEDLE PERC 18GX7CM (NEEDLE) ×4 IMPLANT
PACK CARDIAC CATH (CUSTOM PROCEDURE TRAY) ×4 IMPLANT
PROTECTION STATION PRESSURIZED (MISCELLANEOUS) ×4
SET ATX SIMPLICITY (MISCELLANEOUS) ×4 IMPLANT
SHEATH AVANTI 5FR X 11CM (SHEATH) ×4 IMPLANT
SHEATH AVANTI 6FR X 11CM (SHEATH) ×4 IMPLANT
STATION PROTECTION PRESSURIZED (MISCELLANEOUS) ×2 IMPLANT
STENT RESOLUTE ONYX 2.5X18 (Permanent Stent) ×4 IMPLANT
WIRE G HI TQ BMW 190 (WIRE) ×4 IMPLANT
WIRE HITORQ VERSACORE ST 145CM (WIRE) ×4 IMPLANT

## 2020-06-09 NOTE — Progress Notes (Signed)
Report called to [employer], pt is awake/alert x4  Voiding without event.  Right femoral site c/d/i no s/s hematoma noted. Pulses intactg  Tolerated gingerale/ice chips while in recovery

## 2020-06-09 NOTE — Progress Notes (Signed)
Dr. Clayborn Bigness spoke to patient and patient's son:  Elberta Fortis regarding procedure, follow up, medication change.  Patient verbalizes understanding  To be admitted overnight

## 2020-06-10 ENCOUNTER — Other Ambulatory Visit: Payer: Medicare HMO

## 2020-06-10 LAB — GLUCOSE, CAPILLARY: Glucose-Capillary: 151 mg/dL — ABNORMAL HIGH (ref 70–99)

## 2020-06-10 MED ORDER — TICAGRELOR 90 MG PO TABS
90.0000 mg | ORAL_TABLET | Freq: Two times a day (BID) | ORAL | 3 refills | Status: DC
Start: 2020-06-10 — End: 2020-09-03

## 2020-06-10 NOTE — Discharge Instructions (Signed)
Continue Brilinta 90 twice a day until seen in the office will consider switching to Plavix at a later date Recommend modest aerobic exercise including walking Continue other home medications Call the office if any questions or problems

## 2020-06-10 NOTE — Discharge Summary (Signed)
[facility]    SUBJECTIVE: Patient states to feel reasonably well still has trouble with blood pressure problems denies any shortness of breath no angina today but needs to walk around more denies any groin issues here for hopefully discharge today follow-up with cardiology as an outpatient   Vitals:   06/09/20 1948 06/10/20 0532 06/10/20 0533 06/10/20 0733  BP: (!) 162/74  (!) 195/90 (!) 175/88  Pulse: 66  (!) 58 63  Resp:    19  Temp: 97.7 F (36.5 C)  98 F (36.7 C) 97.6 F (36.4 C)  TempSrc: Oral  Oral Oral  SpO2: 97%  98% 100%  Weight:  119.3 kg    Height:         Intake/Output Summary (Last 24 hours) at 06/10/2020 1056 Last data filed at 06/10/2020 1000 Gross per 24 hour  Intake 1284.56 ml  Output 850 ml  Net 434.56 ml      PHYSICAL EXAM  General: Well developed, well nourished, in no acute distress HEENT:  Normocephalic and atramatic Neck:  No JVD.  Lungs: Clear bilaterally to auscultation and percussion. Heart: HRRR . Normal S1 and S2 without gallops or murmurs.  Abdomen: Bowel sounds are positive, abdomen soft and non-tender  Msk:  Back normal, normal gait. Normal strength and tone for age. Extremities: No clubbing, cyanosis or edema.   Neuro: Alert and oriented X 3. Psych:  Good affect, responds appropriately   LABS: Basic Metabolic Panel: No results for input(s): NA, K, CL, CO2, GLUCOSE, BUN, CREATININE, CALCIUM, MG, PHOS in the last 72 hours. Liver Function Tests: No results for input(s): AST, ALT, ALKPHOS, BILITOT, PROT, ALBUMIN in the last 72 hours. No results for input(s): LIPASE, AMYLASE in the last 72 hours. CBC: No results for input(s): WBC, NEUTROABS, HGB, HCT, MCV, PLT in the last 72 hours. Cardiac Enzymes: No results for input(s): CKTOTAL, CKMB, CKMBINDEX, TROPONINI in the last 72 hours. BNP: Invalid input(s): POCBNP D-Dimer: No results for input(s): DDIMER in the last 72 hours. Hemoglobin A1C: No results for input(s): HGBA1C in the  last 72 hours. Fasting Lipid Panel: No results for input(s): CHOL, HDL, LDLCALC, TRIG, CHOLHDL, LDLDIRECT in the last 72 hours. Thyroid Function Tests: No results for input(s): TSH, T4TOTAL, T3FREE, THYROIDAB in the last 72 hours.  Invalid input(s): FREET3 Anemia Panel: No results for input(s): VITAMINB12, FOLATE, FERRITIN, TIBC, IRON, RETICCTPCT in the last 72 hours.  CARDIAC CATHETERIZATION  Result Date: 06/09/2020  LM lesion is 40% stenosed.  Prox RCA lesion is 99% stenosed.  Mid RCA lesion is 75% stenosed.  Dist RCA lesion is 95% stenosed.  RPDA lesion is 90% stenosed.  SVG and is normal in caliber.  The flow is not reversed.  There is no competitive flow  SVG and is normal in caliber.  The flow is not reversed.  There is no competitive flow  Prox Graft lesion is 100% stenosed.  LIMA and is normal in caliber.  The flow is not reversed.  There is no competitive flow  Origin lesion is 95% stenosed.  Previously placed Prox Cx to Dist Cx stent (unknown type) is widely patent.  Dist Cx lesion is 90% stenosed.  Previously placed Ost LAD to Mid LAD stent (unknown type) is widely patent.  Origin to Prox Graft lesion is 100% stenosed.  A drug-eluting stent was successfully placed using a STENT RESOLUTE ONYX 2.5X18.  Post intervention, there is a 0% residual stenosis.  Ost Cx lesion is 70% stenosed.  Dist LAD-1 lesion  is 50% stenosed.  Previously placed Dist LAD-2 stent (unknown type) is widely patent.  Ost LAD lesion is 75% stenosed.      Echo previously normal EF of 60%  TELEMETRY: Normal sinus rhythm nonspecific T2 changes:  ASSESSMENT AND PLAN:  Active Problems:   S/P drug eluting coronary stent placement Coronary disease Unstable angina Hypertension Diabetes Obesity Obstructive sleep apnea Hyperlipidemia History of failed coronary bypass surgery Postop day 1 PCI and stent DES to SVG to PDA  Plan Continue aspirin Brilinta beta-blocker ARB statin Increase  activity no heavy lifting Continue diabetes management and control Maintain Crestor therapy for statin management Patient is been referred to cardiac rehab Have patient follow-up with cardiology in 1 to 2 weeks   Yolonda Kida, MD 06/10/2020 10:56 AM

## 2020-06-10 NOTE — Progress Notes (Signed)
Rt groin site unchanged, dressing still clean, dry, intact and area surrounding is soft and nontender.  Pt denies pain or distress.  Bp high, bp meds given early as PRN hydralazine has been DC'd.  Will pass on to next shift.

## 2020-06-13 ENCOUNTER — Encounter: Payer: Self-pay | Admitting: Internal Medicine

## 2020-08-21 ENCOUNTER — Emergency Department
Admission: EM | Admit: 2020-08-21 | Discharge: 2020-08-21 | Disposition: A | Discharge status: Home / Self Care | Payer: Medicare HMO | Attending: Emergency Medicine | Admitting: Emergency Medicine

## 2020-08-21 ENCOUNTER — Encounter: Payer: Self-pay | Admitting: Emergency Medicine

## 2020-08-21 ENCOUNTER — Other Ambulatory Visit: Payer: Self-pay

## 2020-08-21 ENCOUNTER — Emergency Department: Payer: Medicare HMO

## 2020-08-21 DIAGNOSIS — Z7901 Long term (current) use of anticoagulants: Secondary | ICD-10-CM | POA: Diagnosis not present

## 2020-08-21 DIAGNOSIS — Z7982 Long term (current) use of aspirin: Secondary | ICD-10-CM | POA: Insufficient documentation

## 2020-08-21 DIAGNOSIS — Z79899 Other long term (current) drug therapy: Secondary | ICD-10-CM | POA: Insufficient documentation

## 2020-08-21 DIAGNOSIS — Z955 Presence of coronary angioplasty implant and graft: Secondary | ICD-10-CM | POA: Diagnosis not present

## 2020-08-21 DIAGNOSIS — R0602 Shortness of breath: Secondary | ICD-10-CM | POA: Insufficient documentation

## 2020-08-21 DIAGNOSIS — Z20822 Contact with and (suspected) exposure to covid-19: Secondary | ICD-10-CM | POA: Diagnosis not present

## 2020-08-21 DIAGNOSIS — E7849 Other hyperlipidemia: Secondary | ICD-10-CM | POA: Diagnosis not present

## 2020-08-21 DIAGNOSIS — I2510 Atherosclerotic heart disease of native coronary artery without angina pectoris: Secondary | ICD-10-CM | POA: Insufficient documentation

## 2020-08-21 DIAGNOSIS — I10 Essential (primary) hypertension: Secondary | ICD-10-CM | POA: Insufficient documentation

## 2020-08-21 DIAGNOSIS — R059 Cough, unspecified: Secondary | ICD-10-CM | POA: Insufficient documentation

## 2020-08-21 DIAGNOSIS — Z794 Long term (current) use of insulin: Secondary | ICD-10-CM | POA: Diagnosis not present

## 2020-08-21 DIAGNOSIS — Z7984 Long term (current) use of oral hypoglycemic drugs: Secondary | ICD-10-CM | POA: Insufficient documentation

## 2020-08-21 DIAGNOSIS — Z951 Presence of aortocoronary bypass graft: Secondary | ICD-10-CM | POA: Diagnosis not present

## 2020-08-21 DIAGNOSIS — R0789 Other chest pain: Secondary | ICD-10-CM | POA: Diagnosis present

## 2020-08-21 DIAGNOSIS — E1169 Type 2 diabetes mellitus with other specified complication: Secondary | ICD-10-CM | POA: Insufficient documentation

## 2020-08-21 DIAGNOSIS — Z86010 Personal history of colonic polyps: Secondary | ICD-10-CM | POA: Insufficient documentation

## 2020-08-21 LAB — BASIC METABOLIC PANEL
Anion gap: 12 (ref 5–15)
BUN: 17 mg/dL (ref 8–23)
CO2: 25 mmol/L (ref 22–32)
Calcium: 11 mg/dL — ABNORMAL HIGH (ref 8.9–10.3)
Chloride: 105 mmol/L (ref 98–111)
Creatinine, Ser: 1.17 mg/dL (ref 0.61–1.24)
GFR, Estimated: >60 mL/min (ref >60)
Glucose, Bld: 163 mg/dL — ABNORMAL HIGH (ref 70–99)
Potassium: 4.5 mmol/L (ref 3.5–5.1)
Sodium: 142 mmol/L (ref 135–145)

## 2020-08-21 LAB — CBC
HCT: 29.9 % — ABNORMAL LOW (ref 39.0–52.0)
Hemoglobin: 8.7 g/dL — ABNORMAL LOW (ref 13.0–17.0)
MCH: 22.7 pg — ABNORMAL LOW (ref 26.0–34.0)
MCHC: 29.1 g/dL — ABNORMAL LOW (ref 30.0–36.0)
MCV: 77.9 fL — ABNORMAL LOW (ref 80.0–100.0)
Platelets: 215 10*3/uL (ref 150–400)
RBC: 3.84 MIL/uL — ABNORMAL LOW (ref 4.22–5.81)
RDW: 16.6 % — ABNORMAL HIGH (ref 11.5–15.5)
WBC: 7.3 10*3/uL (ref 4.0–10.5)
nRBC: 0 % (ref 0.0–0.2)

## 2020-08-21 LAB — TROPONIN I (HIGH SENSITIVITY): Troponin I (High Sensitivity): 14 ng/L (ref <18)

## 2020-08-21 MED ORDER — BENZONATATE 100 MG PO CAPS: 100.0000 mg | ORAL_CAPSULE | Freq: Four times a day (QID) | ORAL | 0 refills | Status: DC | PRN

## 2020-08-21 MED ORDER — FAMOTIDINE 20 MG PO TABS: 20.0000 mg | ORAL_TABLET | Freq: Every day | ORAL | 0 refills | Status: DC

## 2020-08-21 MED ORDER — PREDNISONE 50 MG PO TABS: 50.0000 mg | ORAL_TABLET | Freq: Every day | ORAL | 0 refills | Status: DC

## 2020-08-21 MED ORDER — PSEUDOEPH-BROMPHEN-DM 30-2-10 MG/5ML PO SYRP: 10.0000 mL | ORAL_SOLUTION | Freq: Four times a day (QID) | ORAL | 0 refills | Status: DC | PRN

## 2020-08-21 NOTE — ED Triage Notes (Signed)
Pt to ED via POV c/o intermittent chest pain, shortness of breath, and cough. Pt states that he is coughing up little white balls pf phlegm. Pt is in NAD at this time.

## 2020-08-21 NOTE — ED Notes (Signed)
Lab called to obtain more Covid swabs.

## 2020-08-21 NOTE — ED Notes (Signed)
ED Provider at bedside.( PA) 

## 2020-08-21 NOTE — ED Notes (Signed)
ED Provider at bedside. 

## 2020-08-21 NOTE — ED Notes (Addendum)
Pt c/o productive cough, SOB with eating, L rib/chest pain with coughing x 6 days. Pt denies fever. Pt sts "it feels like the mucus gets hung in my throat". Hx of CABGx3 and stent. Pt noted to have bilateral LE pitting edema which he states is baseline. Pt A&Ox4 at this time.

## 2020-08-21 NOTE — ED Provider Notes (Signed)
[facility] Emergency Department Provider Note  ____________________________________________  Time seen: Approximately 7:59 PM  I have reviewed the triage vital signs and the nursing notes.   HISTORY  Chief Complaint Chest Pain and Shortness of Breath    HPI [name] is a 75 y.o. male who presents the emergency department complaining of cough and chest wall pain from coughing.  Patient states that he has been  experiencing a nonproductive cough x6 days.  No nasal congestion, sore throat.  Patient has had no shortness of breath.  He states that when he coughs he has left-sided rib pain but denies any substernal pain.  He does have a significant cardiac history with a recent CABG and stent placement.  Patient states that this has no similarity in his cardiac chest pain.  He states that he only has pain with cough and it is described as reproducible left rib pain.  Patient states that he had a similar episode with a nonproductive cough several years ago, it was determined to be medication side effect.  Patient states that he has had no return until recently.  He denies any sick contacts.  Again no other URI symptoms other than the cough.  Patient states that he is here "so I can get something to help me sleep."  He is not "worried that it could be something else."  Patient has a history of coronary artery disease, diabetes, GERD, sleep apnea.        Past Medical History:  Diagnosis Date  . Arthritis   . Coronary artery disease   . Diabetes mellitus without complication ([place])   . GERD (gastroesophageal reflux disease)   . Sleep apnea     Patient Active Problem List   Diagnosis Date Noted  . S/P drug eluting coronary stent placement 06/09/2020  . Other hyperlipidemia 08/18/2019  . Diabetes ([place]) 01/14/2017  . Essential hypertension 01/14/2017  . OSA (obstructive sleep apnea) 01/14/2017  . At risk for fluid volume overload 09/08/2016  . BBB (bundle  branch block) 08/30/2016  . Obesity with body mass index (BMI) of 30.0 to 39.9 08/30/2016  . 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 1st degree AV block 08/30/2016  . CAD (coronary artery disease), native coronary artery 08/20/2016  . History of colonic polyps 11/05/2013    Past Surgical History:  Procedure Laterality Date  . APPENDECTOMY    . CARDIAC CATHETERIZATION Left 08/16/2016   Procedure: Left Heart Cath and Coronary Angiography;  Surgeon: Yolonda Kida, MD;  Location: [place] [facility];  Service: Cardiovascular;  Laterality: Left;  . COLON SURGERY    . CORONARY ARTERY BYPASS GRAFT  08/30/2016   triple  . CORONARY STENT INTERVENTION N/A 06/09/2020   Procedure: CORONARY STENT INTERVENTION;  Surgeon: Yolonda Kida, MD;  Location: [place] [facility];  Service: Cardiovascular;  Laterality: N/A;  . LEFT HEART CATH AND CORONARY ANGIOGRAPHY Left 06/09/2020   Procedure: LEFT HEART CATH AND CORONARY ANGIOGRAPHY;  Surgeon: Yolonda Kida, MD;  Location: [place] [facility];  Service: Cardiovascular;  Laterality: Left;  . LEFT HEART CATH AND CORS/GRAFTS ANGIOGRAPHY Left 11/20/2016   Procedure: Left Heart Cath and Cors/Grafts Angiography;  Surgeon: Yolonda Kida, MD;  Location: [place] [facility];  Service: Cardiovascular;  Laterality: Left;    Prior to Admission medications   Medication Sig Start Date End Date Taking? Authorizing Provider  benzonatate (TESSALON PERLES) 100 MG capsule Take 1 capsule (100 mg total) by mouth every  6 (six) hours as needed. 08/21/20 08/21/21 Yes Nicholis Stepanek, Charline Bills, PA-C  brompheniramine-pseudoephedrine-DM 30-2-10 MG/5ML syrup Take 10 mLs by mouth 4 (four) times daily as needed. 08/21/20  Yes Sriya Kroeze, Charline Bills, PA-C  famotidine (PEPCID) 20 MG tablet Take 1 tablet (20 mg total) by mouth daily. 08/21/20 08/21/21 Yes Beatriz Settles, Charline Bills, PA-C  predniSONE (DELTASONE) 50 MG tablet Take 1 tablet (50 mg total) by mouth daily with breakfast. 08/21/20  Yes Somer Trotter,  Charline Bills, PA-C  amLODipine (NORVASC) 10 MG tablet Take 10 mg by mouth daily.    [provider]  aspirin EC 81 MG tablet Take 81 mg by mouth daily.    [provider]  chlorthalidone (HYGROTON) 25 MG tablet Take 12.5 mg by mouth daily. 04/12/20 04/12/21  [provider]  insulin glargine (LANTUS) 100 UNIT/ML injection Inject 25 Units into the skin at bedtime.     [provider]  isosorbide mononitrate (IMDUR) 30 MG 24 hr tablet Take 120 mg by mouth daily.    [provider]  metFORMIN (GLUCOPHAGE-XR) 500 MG 24 hr tablet Take 1,000 mg by mouth 2 (two) times daily.    [provider]  nebivolol (BYSTOLIC) 10 MG tablet Take 10 mg by mouth daily.  06/29/19 06/28/20  [provider]  olmesartan (BENICAR) 20 MG tablet Take 20 mg by mouth daily.    [provider]  omeprazole (PRILOSEC) 40 MG capsule Take 40 mg by mouth daily.    [provider]  rosuvastatin (CRESTOR) 20 MG tablet Take 20 mg by mouth daily.  12/02/17   [provider]  sitaGLIPtin (JANUVIA) 100 MG tablet Take 100 mg by mouth daily.    [provider]  Testosterone 1.62 % GEL APPLY 2 PUMPS TO THE SKIN DAILY Patient taking differently: Apply 2 Pump topically daily.  05/29/20   McGowan, Hunt Oris, PA-C  ticagrelor (BRILINTA) 90 MG TABS tablet Take 1 tablet (90 mg total) by mouth 2 (two) times daily. 06/10/20   Callwood, Dwayne D, MD  triamcinolone (KENALOG) 0.025 % cream Apply 1 application topically daily.  06/23/19   [provider]    Allergies Patient has no known allergies.  History reviewed. No pertinent family history.  Social History Social History   Tobacco Use  . Smoking status: Never Smoker  . Smokeless tobacco: Never Used  Substance Use Topics  . Alcohol use: No  . Drug use: No     Review of Systems  Constitutional: No fever/chills Eyes: No visual changes. No discharge ENT: No upper respiratory  complaints. Cardiovascular: no chest pain. Respiratory: Nonproductive cough. No SOB. Gastrointestinal: No abdominal pain.  No nausea, no vomiting.  No diarrhea.  No constipation. Genitourinary: Negative for dysuria. No hematuria Musculoskeletal: Negative for musculoskeletal pain. Skin: Negative for rash, abrasions, lacerations, ecchymosis. Neurological: Negative for headaches, focal weakness or numbness.  10 System ROS otherwise negative.  ____________________________________________   PHYSICAL EXAM:  VITAL SIGNS: ED Triage Vitals  Enc Vitals Group     BP 08/21/20 1113 (!) 170/70     Pulse Rate 08/21/20 1113 65     Resp 08/21/20 1113 20     Temp 08/21/20 1113 98.3 F (36.8 C)     Temp Source 08/21/20 1113 Oral     SpO2 08/21/20 1113 97 %     Weight 08/21/20 1115 265 lb (120.2 kg)     Height 08/21/20 1115 6\' 1"  (1.854 m)     Head Circumference --  Peak Flow --      Pain Score 08/21/20 1119 0     Pain Loc --      Pain Edu? --      Excl. in [place]? --      Constitutional: Alert and oriented. Well appearing and in no acute distress. Eyes: Conjunctivae are normal. PERRL. EOMI. Head: Atraumatic. ENT:      Ears:       Nose: No congestion/rhinnorhea.      Mouth/Throat: Mucous membranes are moist.  No oropharyngeal erythema or edema noted. Neck: No stridor.   Hematological/Lymphatic/Immunilogical: No cervical lymphadenopathy. Cardiovascular: Normal rate, regular rhythm. Normal S1 and S2.  Good peripheral circulation. Respiratory: Normal respiratory effort without tachypnea or retractions. Lungs CTAB. Good air entry to the bases with no decreased or absent breath sounds. Gastrointestinal: Bowel sounds 4 quadrants. Soft and nontender to palpation. No guarding or rigidity. No palpable masses. No distention. No CVA tenderness. Musculoskeletal: Full range of motion to all extremities. No gross deformities appreciated. Neurologic:  Normal speech and language. No gross focal  neurologic deficits are appreciated.  Skin:  Skin is warm, dry and intact. No rash noted. Psychiatric: Mood and affect are normal. Speech and behavior are normal. Patient exhibits appropriate insight and judgement.   ____________________________________________   LABS (all labs ordered are listed, but only abnormal results are displayed)  Labs Reviewed  BASIC METABOLIC PANEL - Abnormal; Notable for the following components:      Result Value   Glucose, Bld 163 (*)    Calcium 11.0 (*)    All other components within normal limits  CBC - Abnormal; Notable for the following components:   RBC 3.84 (*)    Hemoglobin 8.7 (*)    HCT 29.9 (*)    MCV 77.9 (*)    MCH 22.7 (*)    MCHC 29.1 (*)    RDW 16.6 (*)    All other components within normal limits  SARS CORONAVIRUS 2 (TAT 6-24 HRS)  TROPONIN I (HIGH SENSITIVITY)   ____________________________________________  EKG   ____________________________________________  RADIOLOGY I personally viewed and evaluated these images as part of my medical decision making, as well as reviewing the written report by the radiologist.  ED Provider Interpretation: No consolidation concerning for pneumonia.  Previous CABG identified.  Mild cardiomegaly.  DG Chest 2 View  Result Date: 08/21/2020 CLINICAL DATA:  Cough and shortness of breath EXAM: CHEST - 2 VIEW COMPARISON:  02/11/2017 FINDINGS: Previous median sternotomy and CABG. Mild cardiomegaly. Aortic atherosclerosis and tortuosity. No evidence of heart failure or effusion. Mild chronic scarring at the lung bases. IMPRESSION: No active disease. Previous CABG. Mild cardiomegaly. Aortic atherosclerosis. Electronically Signed   By: Nelson Chimes M.D.   On: 08/21/2020 14:06    ____________________________________________    PROCEDURES  Procedure(s) performed:    Procedures    Medications - No data to display   ____________________________________________   INITIAL IMPRESSION /  ASSESSMENT AND PLAN / ED COURSE  Pertinent labs & imaging results that were available during my care of the patient were reviewed by me and considered in my medical decision making (see chart for details).  Review of the [place] CSRS was performed in accordance of the [place] prior to dispensing any controlled drugs.           Patient's diagnosis is consistent with cough.  Patient presented to emergency department with for 6 days of nonproductive cough.  He also endorsed left-sided rib pain with coughing but states that  the pain was only present during the cough, he had no substernal pain and this was not anywhere consistent with his previous cardiac symptoms.  Patient is here for medication to assist with improvement of his cough.  Differential included esophagitis, GERD, pneumonia, Covid, bronchitis, ACS/STEMI.  Patient had initial labs, chest x-ray, EKG which were reassuring.  However patient did not want a serial troponin drawn.  Patient states that he does not believe that it is any GERD symptoms, states that he believes it is medication side effect and is here only for symptom relief.  Patient states he will talk to his primary care about switching or changing any of the medications to see if this alleviates his symptoms.  Again initial work-up was reassuring, I have not had a second/serial troponin but patient denies adamantly that this is consistent with his cardiac chest pain.  As this pain is only present in the ribs with coughing I do feel that this is unlikely cardiac.  I have discussed with the patient why wanted to complete further work-up.  Patient verbalizes understanding but states that he just wants prescriptions and discharge.  Patient will have symptom control medications of short course of steroid, Bromfed, Tessalon Perles and famotidine prescribed.  Return precautions discussed at length with the patient and he verbalizes understanding.  Patient is given ED precautions to return to the ED  for any worsening or new symptoms.     ____________________________________________  FINAL CLINICAL IMPRESSION(S) / ED DIAGNOSES  Final diagnoses:  Cough  Chest wall pain      NEW MEDICATIONS STARTED DURING THIS VISIT:  ED Discharge Orders         Ordered    predniSONE (DELTASONE) 50 MG tablet  Daily with breakfast        08/21/20 1741    brompheniramine-pseudoephedrine-DM 30-2-10 MG/5ML syrup  4 times daily PRN        08/21/20 1741    benzonatate (TESSALON PERLES) 100 MG capsule  Every 6 hours PRN        08/21/20 1741    famotidine (PEPCID) 20 MG tablet  Daily        08/21/20 1741              This chart was dictated using voice recognition software/Dragon. Despite best efforts to proofread, errors can occur which can change the meaning. Any change was purely unintentional.    Brynda Peon 08/21/20 2010    Carrie Mew, MD 08/21/20 2130

## 2020-08-22 LAB — SARS CORONAVIRUS 2 (TAT 6-24 HRS): SARS Coronavirus 2: NEGATIVE

## 2020-09-02 ENCOUNTER — Other Ambulatory Visit: Payer: Self-pay

## 2020-09-02 ENCOUNTER — Inpatient Hospital Stay
Admission: EM | Admit: 2020-09-02 | Discharge: 2020-09-08 | DRG: 811 | Disposition: A | Discharge status: Home / Self Care | Payer: Medicare HMO | Attending: Internal Medicine | Admitting: Internal Medicine

## 2020-09-02 ENCOUNTER — Other Ambulatory Visit
Admission: RE | Admit: 2020-09-02 | Discharge: 2020-09-02 | Disposition: A | Discharge status: Home / Self Care | Payer: Medicare HMO | Source: Home / Self Care | Attending: Urology | Admitting: Urology

## 2020-09-02 ENCOUNTER — Encounter: Payer: Self-pay | Admitting: Emergency Medicine

## 2020-09-02 ENCOUNTER — Other Ambulatory Visit: Payer: Self-pay | Admitting: *Deleted

## 2020-09-02 ENCOUNTER — Telehealth: Payer: Self-pay | Admitting: Family Medicine

## 2020-09-02 DIAGNOSIS — K648 Other hemorrhoids: Secondary | ICD-10-CM | POA: Diagnosis present

## 2020-09-02 DIAGNOSIS — D500 Iron deficiency anemia secondary to blood loss (chronic): Secondary | ICD-10-CM | POA: Diagnosis present

## 2020-09-02 DIAGNOSIS — Z7901 Long term (current) use of anticoagulants: Secondary | ICD-10-CM

## 2020-09-02 DIAGNOSIS — E349 Endocrine disorder, unspecified: Secondary | ICD-10-CM | POA: Insufficient documentation

## 2020-09-02 DIAGNOSIS — N138 Other obstructive and reflux uropathy: Secondary | ICD-10-CM

## 2020-09-02 DIAGNOSIS — D649 Anemia, unspecified: Secondary | ICD-10-CM

## 2020-09-02 DIAGNOSIS — Z7984 Long term (current) use of oral hypoglycemic drugs: Secondary | ICD-10-CM

## 2020-09-02 DIAGNOSIS — I10 Essential (primary) hypertension: Secondary | ICD-10-CM

## 2020-09-02 DIAGNOSIS — E876 Hypokalemia: Secondary | ICD-10-CM | POA: Diagnosis present

## 2020-09-02 DIAGNOSIS — Z8719 Personal history of other diseases of the digestive system: Secondary | ICD-10-CM

## 2020-09-02 DIAGNOSIS — E1169 Type 2 diabetes mellitus with other specified complication: Secondary | ICD-10-CM | POA: Diagnosis present

## 2020-09-02 DIAGNOSIS — D490 Neoplasm of unspecified behavior of digestive system: Secondary | ICD-10-CM | POA: Diagnosis present

## 2020-09-02 DIAGNOSIS — N401 Enlarged prostate with lower urinary tract symptoms: Secondary | ICD-10-CM | POA: Insufficient documentation

## 2020-09-02 DIAGNOSIS — I2510 Atherosclerotic heart disease of native coronary artery without angina pectoris: Secondary | ICD-10-CM | POA: Diagnosis present

## 2020-09-02 DIAGNOSIS — Z7982 Long term (current) use of aspirin: Secondary | ICD-10-CM

## 2020-09-02 DIAGNOSIS — Z8616 Personal history of COVID-19: Secondary | ICD-10-CM

## 2020-09-02 DIAGNOSIS — K644 Residual hemorrhoidal skin tags: Secondary | ICD-10-CM | POA: Diagnosis present

## 2020-09-02 DIAGNOSIS — Z7952 Long term (current) use of systemic steroids: Secondary | ICD-10-CM

## 2020-09-02 DIAGNOSIS — K635 Polyp of colon: Secondary | ICD-10-CM

## 2020-09-02 DIAGNOSIS — E119 Type 2 diabetes mellitus without complications: Secondary | ICD-10-CM

## 2020-09-02 DIAGNOSIS — K922 Gastrointestinal hemorrhage, unspecified: Secondary | ICD-10-CM

## 2020-09-02 DIAGNOSIS — D62 Acute posthemorrhagic anemia: Secondary | ICD-10-CM | POA: Diagnosis not present

## 2020-09-02 DIAGNOSIS — Z79890 Hormone replacement therapy (postmenopausal): Secondary | ICD-10-CM

## 2020-09-02 DIAGNOSIS — I25119 Atherosclerotic heart disease of native coronary artery with unspecified angina pectoris: Secondary | ICD-10-CM | POA: Diagnosis present

## 2020-09-02 DIAGNOSIS — K3189 Other diseases of stomach and duodenum: Secondary | ICD-10-CM

## 2020-09-02 DIAGNOSIS — Z951 Presence of aortocoronary bypass graft: Secondary | ICD-10-CM

## 2020-09-02 DIAGNOSIS — K219 Gastro-esophageal reflux disease without esophagitis: Secondary | ICD-10-CM | POA: Diagnosis present

## 2020-09-02 DIAGNOSIS — Z794 Long term (current) use of insulin: Secondary | ICD-10-CM

## 2020-09-02 DIAGNOSIS — M1990 Unspecified osteoarthritis, unspecified site: Secondary | ICD-10-CM | POA: Diagnosis present

## 2020-09-02 DIAGNOSIS — K317 Polyp of stomach and duodenum: Secondary | ICD-10-CM | POA: Diagnosis present

## 2020-09-02 DIAGNOSIS — N183 Chronic kidney disease, stage 3 unspecified: Secondary | ICD-10-CM | POA: Diagnosis present

## 2020-09-02 DIAGNOSIS — E7849 Other hyperlipidemia: Secondary | ICD-10-CM | POA: Diagnosis present

## 2020-09-02 DIAGNOSIS — U071 COVID-19: Secondary | ICD-10-CM

## 2020-09-02 DIAGNOSIS — E041 Nontoxic single thyroid nodule: Secondary | ICD-10-CM | POA: Diagnosis present

## 2020-09-02 DIAGNOSIS — I129 Hypertensive chronic kidney disease with stage 1 through stage 4 chronic kidney disease, or unspecified chronic kidney disease: Secondary | ICD-10-CM | POA: Diagnosis present

## 2020-09-02 DIAGNOSIS — Z955 Presence of coronary angioplasty implant and graft: Secondary | ICD-10-CM

## 2020-09-02 DIAGNOSIS — E1122 Type 2 diabetes mellitus with diabetic chronic kidney disease: Secondary | ICD-10-CM | POA: Diagnosis present

## 2020-09-02 DIAGNOSIS — E785 Hyperlipidemia, unspecified: Secondary | ICD-10-CM | POA: Diagnosis present

## 2020-09-02 DIAGNOSIS — D519 Vitamin B12 deficiency anemia, unspecified: Secondary | ICD-10-CM

## 2020-09-02 DIAGNOSIS — E538 Deficiency of other specified B group vitamins: Secondary | ICD-10-CM | POA: Diagnosis present

## 2020-09-02 DIAGNOSIS — Z79899 Other long term (current) drug therapy: Secondary | ICD-10-CM

## 2020-09-02 HISTORY — DX: Personal history of COVID-19: Z86.16

## 2020-09-02 HISTORY — DX: Personal history of other diseases of the digestive system: Z87.19

## 2020-09-02 LAB — CBC WITH DIFFERENTIAL/PLATELET
Abs Immature Granulocytes: 0.02 10*3/uL (ref 0.00–0.07)
Basophils Absolute: 0.1 10*3/uL (ref 0.0–0.1)
Basophils Relative: 1 %
Eosinophils Absolute: 0 10*3/uL (ref 0.0–0.5)
Eosinophils Relative: 1 %
HCT: 27.8 % — ABNORMAL LOW (ref 39.0–52.0)
Hemoglobin: 8.2 g/dL — ABNORMAL LOW (ref 13.0–17.0)
Immature Granulocytes: 0 %
Lymphocytes Relative: 12 %
Lymphs Abs: 1 10*3/uL (ref 0.7–4.0)
MCH: 22.5 pg — ABNORMAL LOW (ref 26.0–34.0)
MCHC: 29.5 g/dL — ABNORMAL LOW (ref 30.0–36.0)
MCV: 76.2 fL — ABNORMAL LOW (ref 80.0–100.0)
Monocytes Absolute: 0.6 10*3/uL (ref 0.1–1.0)
Monocytes Relative: 7 %
Neutro Abs: 7.1 10*3/uL (ref 1.7–7.7)
Neutrophils Relative %: 79 %
Platelets: 226 10*3/uL (ref 150–400)
RBC: 3.65 MIL/uL — ABNORMAL LOW (ref 4.22–5.81)
RDW: 16.5 % — ABNORMAL HIGH (ref 11.5–15.5)
WBC: 8.9 10*3/uL (ref 4.0–10.5)
nRBC: 0.2 % (ref 0.0–0.2)

## 2020-09-02 LAB — COMPREHENSIVE METABOLIC PANEL
ALT: 18 U/L (ref 0–44)
AST: 23 U/L (ref 15–41)
Albumin: 4 g/dL (ref 3.5–5.0)
Alkaline Phosphatase: 69 U/L (ref 38–126)
Anion gap: 15 (ref 5–15)
BUN: 36 mg/dL — ABNORMAL HIGH (ref 8–23)
CO2: 23 mmol/L (ref 22–32)
Calcium: 11.1 mg/dL — ABNORMAL HIGH (ref 8.9–10.3)
Chloride: 107 mmol/L (ref 98–111)
Creatinine, Ser: 1.38 mg/dL — ABNORMAL HIGH (ref 0.61–1.24)
GFR, Estimated: 54 mL/min — ABNORMAL LOW (ref >60)
Glucose, Bld: 104 mg/dL — ABNORMAL HIGH (ref 70–99)
Potassium: 4.3 mmol/L (ref 3.5–5.1)
Sodium: 145 mmol/L (ref 135–145)
Total Bilirubin: 0.7 mg/dL (ref 0.3–1.2)
Total Protein: 7 g/dL (ref 6.5–8.1)

## 2020-09-02 LAB — HEMOGLOBIN AND HEMATOCRIT, BLOOD
HCT: 27.4 % — ABNORMAL LOW (ref 39.0–52.0)
HCT: 27.9 % — ABNORMAL LOW (ref 39.0–52.0)
Hemoglobin: 7.8 g/dL — ABNORMAL LOW (ref 13.0–17.0)
Hemoglobin: 8.3 g/dL — ABNORMAL LOW (ref 13.0–17.0)

## 2020-09-02 LAB — PSA: Prostatic Specific Antigen: 0.43 ng/mL (ref 0.00–4.00)

## 2020-09-02 MED ORDER — ONDANSETRON HCL 4 MG/2ML IJ SOLN: 4.0000 mg | Freq: Four times a day (QID) | INTRAMUSCULAR | Status: DC | PRN

## 2020-09-02 MED ORDER — IRBESARTAN 150 MG PO TABS
300.0000 mg | ORAL_TABLET | Freq: Every day | ORAL | Status: DC
  Administered 2020-09-03 – 2020-09-08 (×6): 300 mg via ORAL
  Filled 2020-09-02 (×7): qty 2

## 2020-09-02 MED ORDER — ACETAMINOPHEN 650 MG RE SUPP: 650.0000 mg | Freq: Four times a day (QID) | RECTAL | Status: DC | PRN

## 2020-09-02 MED ORDER — INSULIN ASPART 100 UNIT/ML ~~LOC~~ SOLN
0.0000 [IU] | Freq: Three times a day (TID) | SUBCUTANEOUS | Status: DC
  Administered 2020-09-03: 3 [IU] via SUBCUTANEOUS
  Administered 2020-09-03: 2 [IU] via SUBCUTANEOUS
  Administered 2020-09-04 – 2020-09-05 (×4): 1 [IU] via SUBCUTANEOUS
  Administered 2020-09-05 – 2020-09-07 (×3): 2 [IU] via SUBCUTANEOUS
  Administered 2020-09-08: 1 [IU] via SUBCUTANEOUS
  Administered 2020-09-08: 2 [IU] via SUBCUTANEOUS
  Filled 2020-09-02 (×10): qty 1

## 2020-09-02 MED ORDER — NEBIVOLOL HCL 10 MG PO TABS
10.0000 mg | ORAL_TABLET | Freq: Every day | ORAL | Status: DC
  Administered 2020-09-03 – 2020-09-08 (×6): 10 mg via ORAL
  Filled 2020-09-02 (×8): qty 1

## 2020-09-02 MED ORDER — INSULIN GLARGINE 100 UNIT/ML ~~LOC~~ SOLN
10.0000 [IU] | Freq: Every day | SUBCUTANEOUS | Status: DC
  Administered 2020-09-02 – 2020-09-07 (×6): 10 [IU] via SUBCUTANEOUS
  Filled 2020-09-02 (×8): qty 0.1

## 2020-09-02 MED ORDER — PANTOPRAZOLE SODIUM 40 MG IV SOLR
40.0000 mg | Freq: Two times a day (BID) | INTRAVENOUS | Status: DC
  Administered 2020-09-02 – 2020-09-08 (×12): 40 mg via INTRAVENOUS
  Filled 2020-09-02 (×12): qty 40

## 2020-09-02 MED ORDER — ONDANSETRON HCL 4 MG PO TABS: 4.0000 mg | ORAL_TABLET | Freq: Four times a day (QID) | ORAL | Status: DC | PRN

## 2020-09-02 MED ORDER — ACETAMINOPHEN 325 MG PO TABS: 650.0000 mg | ORAL_TABLET | Freq: Four times a day (QID) | ORAL | Status: DC | PRN

## 2020-09-02 MED ORDER — ROSUVASTATIN CALCIUM 10 MG PO TABS
40.0000 mg | ORAL_TABLET | Freq: Every day | ORAL | Status: DC
  Administered 2020-09-03 – 2020-09-07 (×5): 40 mg via ORAL
  Filled 2020-09-02 (×3): qty 4
  Filled 2020-09-02: qty 2
  Filled 2020-09-02: qty 4

## 2020-09-02 MED ORDER — ISOSORBIDE MONONITRATE ER 30 MG PO TB24
60.0000 mg | ORAL_TABLET | Freq: Every day | ORAL | Status: DC
  Administered 2020-09-03: 60 mg via ORAL
  Filled 2020-09-02: qty 1

## 2020-09-02 MED ORDER — AMLODIPINE BESYLATE 10 MG PO TABS
10.0000 mg | ORAL_TABLET | Freq: Every day | ORAL | Status: DC
  Administered 2020-09-03 – 2020-09-08 (×6): 10 mg via ORAL
  Filled 2020-09-02: qty 1
  Filled 2020-09-02: qty 2
  Filled 2020-09-02 (×4): qty 1

## 2020-09-02 NOTE — Telephone Encounter (Signed)
Patient returned call and states he will call his PCP.

## 2020-09-02 NOTE — ED Provider Notes (Signed)
[facility] Emergency Department Provider Note   ____________________________________________    I have reviewed the triage vital signs and the nursing notes.   HISTORY  Chief Complaint Anemia     HPI [name] is a 75 y.o. male with history of CAD, diabetes, on aspirin and Plavix who presents with reports of low blood counts. Patient reports he was at [facility] emergency department yesterday because he has been having shortness of breath for several weeks. He reports an overall reassuring work-up. Had urology appointment today and noted to have a hemoglobin of 7.9, down from 9.8 at [facility]. He reports shortness of breath for several weeks that is worse with exertion, denies chest pain, no cough fevers or chills. Reports dark stools which she has had tested by his PCP in the past which were negative for blood  Past Medical History:  Diagnosis Date   Arthritis    Coronary artery disease    Diabetes mellitus without complication (HCC)    GERD (gastroesophageal reflux disease)    Sleep apnea     Patient Active Problem List   Diagnosis Date Noted   Symptomatic anemia 09/02/2020   Hyperlipidemia associated with type 2 diabetes mellitus (HCC) 09/02/2020   S/P drug eluting coronary stent placement 06/09/2020   Other hyperlipidemia 08/18/2019   Insulin dependent type 2 diabetes mellitus (HCC) 01/14/2017   Hypertension associated with diabetes (HCC) 01/14/2017   OSA (obstructive sleep apnea) 01/14/2017   At risk for fluid volume overload 09/08/2016   BBB (bundle branch block) 08/30/2016   Obesity with body mass index (BMI) of 30.0 to 39.9 08/30/2016   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   1st degree AV block 08/30/2016   CAD (coronary artery disease), native coronary artery 08/20/2016   History of colonic polyps 11/05/2013    Past Surgical History:  Procedure Laterality Date   APPENDECTOMY     CARDIAC CATHETERIZATION Left 08/16/2016    Procedure: Left Heart Cath and Coronary Angiography;  Surgeon: Alwyn Pea, MD;  Location: [facility] INVASIVE CV LAB;  Service: Cardiovascular;  Laterality: Left;   COLON SURGERY     CORONARY ARTERY BYPASS GRAFT  08/30/2016   triple   CORONARY STENT INTERVENTION N/A 06/09/2020   Procedure: CORONARY STENT INTERVENTION;  Surgeon: Alwyn Pea, MD;  Location: [facility] INVASIVE CV LAB;  Service: Cardiovascular;  Laterality: N/A;   LEFT HEART CATH AND CORONARY ANGIOGRAPHY Left 06/09/2020   Procedure: LEFT HEART CATH AND CORONARY ANGIOGRAPHY;  Surgeon: Alwyn Pea, MD;  Location: [facility] INVASIVE CV LAB;  Service: Cardiovascular;  Laterality: Left;   LEFT HEART CATH AND CORS/GRAFTS ANGIOGRAPHY Left 11/20/2016   Procedure: Left Heart Cath and Cors/Grafts Angiography;  Surgeon: Alwyn Pea, MD;  Location: [facility] INVASIVE CV LAB;  Service: Cardiovascular;  Laterality: Left;    Prior to Admission medications   Medication Sig Start Date End Date Taking? Authorizing Provider  amLODipine (NORVASC) 10 MG tablet Take 10 mg by mouth daily.    [provider]  aspirin EC 81 MG tablet Take 81 mg by mouth daily.    [provider]  benzonatate (TESSALON PERLES) 100 MG capsule Take 1 capsule (100 mg total) by mouth every 6 (six) hours as needed. 08/21/20 08/21/21  Cuthriell, Delorise Royals, PA-C  brompheniramine-pseudoephedrine-DM 30-2-10 MG/5ML syrup Take 10 mLs by mouth 4 (four) times daily as needed. 08/21/20   Cuthriell, Delorise Royals, PA-C  chlorthalidone (HYGROTON) 25 MG tablet Take 12.5 mg by mouth daily. 04/12/20  04/12/21  [provider]  famotidine (PEPCID) 20 MG tablet Take 1 tablet (20 mg total) by mouth daily. 08/21/20 08/21/21  Cuthriell, Charline Bills, PA-C  insulin glargine (LANTUS) 100 UNIT/ML injection Inject 25 Units into the skin at bedtime.     [provider]  isosorbide mononitrate (IMDUR) 30 MG 24 hr tablet Take 120 mg by mouth daily.    [provider]   metFORMIN (GLUCOPHAGE-XR) 500 MG 24 hr tablet Take 1,000 mg by mouth 2 (two) times daily.    [provider]  nebivolol (BYSTOLIC) 10 MG tablet Take 10 mg by mouth daily.  06/29/19 06/28/20  [provider]  olmesartan (BENICAR) 20 MG tablet Take 20 mg by mouth daily.    [provider]  omeprazole (PRILOSEC) 40 MG capsule Take 40 mg by mouth daily.    [provider]  predniSONE (DELTASONE) 50 MG tablet Take 1 tablet (50 mg total) by mouth daily with breakfast. 08/21/20   Cuthriell, Charline Bills, PA-C  rosuvastatin (CRESTOR) 20 MG tablet Take 20 mg by mouth daily.  12/02/17   [provider]  sitaGLIPtin (JANUVIA) 100 MG tablet Take 100 mg by mouth daily.    [provider]  Testosterone 1.62 % GEL APPLY 2 PUMPS TO THE SKIN DAILY Patient taking differently: Apply 2 Pump topically daily.  05/29/20   McGowan, Hunt Oris, PA-C  ticagrelor (BRILINTA) 90 MG TABS tablet Take 1 tablet (90 mg total) by mouth 2 (two) times daily. 06/10/20   Callwood, Dwayne D, MD  triamcinolone (KENALOG) 0.025 % cream Apply 1 application topically daily.  06/23/19   [provider]     Allergies Patient has no known allergies.  No family history on file.  Social History Social History   Tobacco Use   Smoking status: Never Smoker   Smokeless tobacco: Never Used  Substance Use Topics   Alcohol use: No   Drug use: No    Review of Systems  Constitutional: No fever/chills Eyes: No visual changes.  ENT: No sore throat. Cardiovascular: Denies chest pain. Respiratory: As above Gastrointestinal: No abdominal pain Genitourinary: Negative for dysuria. Musculoskeletal: Negative for back pain. Skin: Negative for rash. Neurological: Negative for headaches or weakness   ____________________________________________   PHYSICAL EXAM:  VITAL SIGNS: ED Triage Vitals  Enc Vitals Group     BP 09/02/20 1535 (!) 154/77     Pulse Rate 09/02/20 1535 78      Resp 09/02/20 1535 16     Temp 09/02/20 1535 98.2 F (36.8 C)     Temp Source 09/02/20 1535 Oral     SpO2 09/02/20 1535 100 %     Weight 09/02/20 1531 117.9 kg (260 lb)     Height 09/02/20 1531 1.854 m (6\' 1" )     Head Circumference --      Peak Flow --      Pain Score 09/02/20 1531 0     Pain Loc --      Pain Edu? --      Excl. in [place]? --     Constitutional: Alert and oriented. Pleasant and interactive  Nose: No congestion/rhinnorhea. Mouth/Throat: Mucous membranes are moist.    Cardiovascular: Normal rate, regular rhythm. Grossly normal heart sounds.  Good peripheral circulation. Respiratory: Normal respiratory effort.  No retractions. Lungs CTAB. Gastrointestinal: Soft and nontender. No distention.  No CVA tenderness. Rectal exam guaiac positive black stool reddish tinge  Musculoskeletal:  Warm and well perfused Neurologic:  Normal speech  and language. No gross focal neurologic deficits are appreciated.  Skin:  Skin is warm, dry and intact. No rash noted. Psychiatric: Mood and affect are normal. Speech and behavior are normal.  ____________________________________________   LABS (all labs ordered are listed, but only abnormal results are displayed)  Labs Reviewed  COMPREHENSIVE METABOLIC PANEL - Abnormal; Notable for the following components:      Result Value   Glucose, Bld 104 (*)    BUN 36 (*)    Creatinine, Ser 1.38 (*)    Calcium 11.1 (*)    GFR, Estimated 54 (*)    All other components within normal limits  CBC WITH DIFFERENTIAL/PLATELET - Abnormal; Notable for the following components:   RBC 3.65 (*)    Hemoglobin 8.2 (*)    HCT 27.8 (*)    MCV 76.2 (*)    MCH 22.5 (*)    MCHC 29.5 (*)    RDW 16.5 (*)    All other components within normal limits  HEMOGLOBIN AND HEMATOCRIT, BLOOD - Abnormal; Notable for the following components:   Hemoglobin 8.3 (*)    HCT 27.9 (*)    All other components within normal limits  SARS CORONAVIRUS 2 (TAT 6-24 HRS)   FERRITIN  IRON AND TIBC  POC OCCULT BLOOD, ED  TYPE AND SCREEN   ____________________________________________  EKG  None ____________________________________________  RADIOLOGY  None ____________________________________________   PROCEDURES  Procedure(s) performed: No  Procedures   Critical Care performed: yes  CRITICAL CARE Performed by: Lavonia Drafts   Total critical care time: 30 minutes  Critical care time was exclusive of separately billable procedures and treating other patients.  Critical care was necessary to treat or prevent imminent or life-threatening deterioration.  Critical care was time spent personally by me on the following activities: development of treatment plan with patient and/or surrogate as well as nursing, discussions with consultants, evaluation of patient's response to treatment, examination of patient, obtaining history from patient or surrogate, ordering and performing treatments and interventions, ordering and review of laboratory studies, ordering and review of radiographic studies, pulse oximetry and re-evaluation of patient's condition.  ____________________________________________   INITIAL IMPRESSION / ASSESSMENT AND PLAN / ED COURSE  Pertinent labs & imaging results that were available during my care of the patient were reviewed by me and considered in my medical decision making (see chart for details).  Patient presents with decreased hemoglobin, shortness of breath consistent with symptomatic anemia. Hemoglobin 9.8 yesterday at [facility], 7.9 and his urology appointment. Today it is 8.2 in the emergency department. Reviewed medical records over the last several months, there does appear to be a steady decline, more rapid recently.  Low MCV but atypical for iron deficiency anemia.  Rectal exam Black stool, guaiac positive, this is the cause for patient's decreased hemoglobin. He will require admission given abrupt drop in hemoglobin  from yesterday at this time we will not transfuse the patient although discussed with him that he may require 1.    ____________________________________________   FINAL CLINICAL IMPRESSION(S) / ED DIAGNOSES  Final diagnoses:  Gastrointestinal hemorrhage, unspecified gastrointestinal hemorrhage type        Note:  This document was prepared using Dragon voice recognition software and may include unintentional dictation errors.   Lavonia Drafts, MD 09/02/20 [phone]

## 2020-09-02 NOTE — ED Triage Notes (Signed)
Pt to ED via POV stating that he was seen at [facility] yesterday for shortness of breath. Pt had labs done while at [facility] Pt is to see his urologist next week and he had blood done work this morning. Pts Hgb dropped from 9.8 yesterday to 7.8 today. Pt denies blood in his stool. Pt denies vomiting blood. Pt states that he is short of breath but this has been going on for a few weeks. Pt is in NAD at this time.

## 2020-09-02 NOTE — Telephone Encounter (Signed)
Nori Riis, PA-C  09/02/2020 12:22 PM EST  I left a message for Mr. Vickerman to call us back. If he calls back, he needs to call his PCP right away or go the the ED because his hemoglobin has dropped since his ED visit last week. He may be actively bleeding somewhere and needs to be evaluated soon. If he does not and his levels continue to drop, he could suffer a heart attack, stroke or pass out.

## 2020-09-02 NOTE — H&P (Signed)
History and Physical    [name] [id number] DOB: 13-Apr-1946 DOA: 09/02/2020  PCP: Albina Billet, MD  Patient coming from: Home  I have personally briefly reviewed patient's old medical records in [place]  Chief Complaint: Shortness of breath, anemia  HPI: [name] is a 75 y.o. male with medical history significant for CAD s/p CABG 2018, failed bypass s/p PCI with DES to RCA 06/09/2020 currently on aspirin/Plavix, IDT2DM, HTN, hyperlipidemia, chronic lower extremity edema left > right, and obesity who presents to the ED for evaluation of anemia.  Patient has been having approximately 2 weeks of shortness of breath and cough.  He was initially seen in [facility] ED 08/21/2020 at which time COVID test was negative, troponin was negative, hemoglobin 8.7, and chest x-ray negative for pneumonia, edema, or effusion.  Patient was then seen at the [facility] ED yesterday 09/01/2020.  He was noted to have asymmetric swelling in his lower extremities, left greater than right. LLE venous duplex was negative for evidence of DVT.  CXR was clear.  CTA chest was negative for PE.  A right thyroid nodule was noted.  Hemoglobin was 9.8.  Troponin was reassuring.  Rapid flu and COVID tests were again negative.  Patient had labs performed earlier today (09/02/2020) minus urology team.  Labs are notable for drop in hemoglobin to 7.8.  Patient was notified of result and advised to proceed to the ED for further evaluation and management.  Patient states that he noticed the shortness of breath occurring more often and with decrease severity at mealtime.  He has milder symptoms with exertion.  He does have some occasional lightheadedness/dizziness when he is exerting himself as well.  He has occasional chest wall pain related to cough and also intermittent anginal discomfort which is unchanged from his baseline.  He has not seen any obvious bleeding until today when he noticed dark-colored stool.  He denies any  nausea, vomiting.  He says he last took both aspirin and Plavix this morning.  ED Course:  Initial vitals showed BP 154/77, pulse 78, RR 16, temp 98.2 F, SPO2 100% on room air.  Initial labs notable for hemoglobin 8.2, hematocrit 27.8, MCV 76.2, RDW 16.5, platelets 226,000, WBC 8.9, sodium 145, potassium 4.3, bicarb 23, BUN 36, creatinine 1.38, serum glucose 104, LFTs within normal limits, calcium 11.1, albumin 4.0.  Repeat hemoglobin 8.3.  SARS-CoV-2 PCR is ordered and pending.  Per EDP, rectal exam was notable for guaiac positive dark stool with dark red blood.  The hospitalist service was consulted to admit for further evaluation and management.  Review of Systems: All systems reviewed and are negative except as documented in history of present illness above.   Past Medical History:  Diagnosis Date  . Arthritis   . Coronary artery disease   . Diabetes mellitus without complication ([place])   . GERD (gastroesophageal reflux disease)   . Sleep apnea     Past Surgical History:  Procedure Laterality Date  . APPENDECTOMY    . CARDIAC CATHETERIZATION Left 08/16/2016   Procedure: Left Heart Cath and Coronary Angiography;  Surgeon: Yolonda Kida, MD;  Location: [place] CV LAB;  Service: Cardiovascular;  Laterality: Left;  . COLON SURGERY    . CORONARY ARTERY BYPASS GRAFT  08/30/2016   triple  . CORONARY STENT INTERVENTION N/A 06/09/2020   Procedure: CORONARY STENT INTERVENTION;  Surgeon: Yolonda Kida, MD;  Location: [place] CV LAB;  Service: Cardiovascular;  Laterality: N/A;  .  LEFT HEART CATH AND CORONARY ANGIOGRAPHY Left 06/09/2020   Procedure: LEFT HEART CATH AND CORONARY ANGIOGRAPHY;  Surgeon: Yolonda Kida, MD;  Location: [place] CV LAB;  Service: Cardiovascular;  Laterality: Left;  . LEFT HEART CATH AND CORS/GRAFTS ANGIOGRAPHY Left 11/20/2016   Procedure: Left Heart Cath and Cors/Grafts Angiography;  Surgeon: Yolonda Kida, MD;  Location: [place] CV LAB;  Service: Cardiovascular;  Laterality: Left;    Social History:  reports that he has never smoked. He has never used smokeless tobacco. He reports that he does not drink alcohol and does not use drugs.  No Known Allergies  History reviewed. No pertinent family history.   Prior to Admission medications   Medication Sig Start Date End Date Taking? Authorizing Provider  amLODipine (NORVASC) 10 MG tablet Take 10 mg by mouth daily.    [provider]  aspirin EC 81 MG tablet Take 81 mg by mouth daily.    [provider]  benzonatate (TESSALON PERLES) 100 MG capsule Take 1 capsule (100 mg total) by mouth every 6 (six) hours as needed. 08/21/20 08/21/21  Cuthriell, Charline Bills, PA-C  brompheniramine-pseudoephedrine-DM 30-2-10 MG/5ML syrup Take 10 mLs by mouth 4 (four) times daily as needed. 08/21/20   Cuthriell, Charline Bills, PA-C  chlorthalidone (HYGROTON) 25 MG tablet Take 12.5 mg by mouth daily. 04/12/20 04/12/21  [provider]  famotidine (PEPCID) 20 MG tablet Take 1 tablet (20 mg total) by mouth daily. 08/21/20 08/21/21  Cuthriell, Charline Bills, PA-C  insulin glargine (LANTUS) 100 UNIT/ML injection Inject 25 Units into the skin at bedtime.     [provider]  isosorbide mononitrate (IMDUR) 30 MG 24 hr tablet Take 120 mg by mouth daily.    [provider]  metFORMIN (GLUCOPHAGE-XR) 500 MG 24 hr tablet Take 1,000 mg by mouth 2 (two) times daily.    [provider]  nebivolol (BYSTOLIC) 10 MG tablet Take 10 mg by mouth daily.  06/29/19 06/28/20  [provider]  olmesartan (BENICAR) 20 MG tablet Take 20 mg by mouth daily.    [provider]  omeprazole (PRILOSEC) 40 MG capsule Take 40 mg by mouth daily.    [provider]  predniSONE (DELTASONE) 50 MG tablet Take 1 tablet (50 mg total) by mouth daily with breakfast. 08/21/20   Cuthriell, Charline Bills, PA-C  rosuvastatin (CRESTOR) 20 MG tablet Take 20 mg by mouth  daily.  12/02/17   [provider]  sitaGLIPtin (JANUVIA) 100 MG tablet Take 100 mg by mouth daily.    [provider]  Testosterone 1.62 % GEL APPLY 2 PUMPS TO THE SKIN DAILY Patient taking differently: Apply 2 Pump topically daily.  05/29/20   McGowan, Hunt Oris, PA-C  ticagrelor (BRILINTA) 90 MG TABS tablet Take 1 tablet (90 mg total) by mouth 2 (two) times daily. 06/10/20   Callwood, Dwayne D, MD  triamcinolone (KENALOG) 0.025 % cream Apply 1 application topically daily.  06/23/19   [provider]    Physical Exam: Vitals:   09/02/20 1531 09/02/20 1535 09/02/20 1958  BP:  (!) 154/77 (!) 182/76  Pulse:  78 83  Resp:  16 15  Temp:  98.2 F (36.8 C)   TempSrc:  Oral   SpO2:  100% 100%  Weight: 117.9 kg    Height: 6\' 1"  (1.854 m)     Constitutional: Sitting up in bed, NAD, calm, comfortable Eyes: PERRL, lids and conjunctivae normal ENMT: Mucous membranes are moist. Posterior pharynx  clear of any exudate or lesions.Normal dentition.  Neck: normal, supple, no masses. Respiratory: clear to auscultation bilaterally, no wheezing, no crackles. Normal respiratory effort. No accessory muscle use.  Cardiovascular: Regular rate and rhythm, 2/6 systolic murmur.  +1 bilateral lower extremity edema.  Unchanged from baseline per patient. Abdomen: no tenderness, no masses palpated. No hepatosplenomegaly. Bowel sounds positive.  Musculoskeletal: no clubbing / cyanosis. No joint deformity upper and lower extremities. Good ROM, no contractures. Normal muscle tone.  Skin: no rashes, lesions, ulcers. No induration Neurologic: CN 2-12 grossly intact. Sensation intact. Strength 5/5 in all 4.  Psychiatric: Normal judgment and insight. Alert and oriented x 3. Normal mood.   Labs on Admission: I have personally reviewed following labs and imaging studies  CBC: Recent Labs  Lab 09/02/20 1156 09/02/20 1534 09/02/20 1932  WBC  --  8.9  --   NEUTROABS  --  7.1  --   HGB 7.8*  8.2* 8.3*  HCT 27.4* 27.8* 27.9*  MCV  --  76.2*  --   PLT  --  226  --    Basic Metabolic Panel: Recent Labs  Lab 09/02/20 1534  NA 145  K 4.3  CL 107  CO2 23  GLUCOSE 104*  BUN 36*  CREATININE 1.38*  CALCIUM 11.1*   GFR: Estimated Creatinine Clearance: 65.1 mL/min (A) (by C-G formula based on SCr of 1.38 mg/dL (H)). Liver Function Tests: Recent Labs  Lab 09/02/20 1534  AST 23  ALT 18  ALKPHOS 69  BILITOT 0.7  PROT 7.0  ALBUMIN 4.0   No results for input(s): LIPASE, AMYLASE in the last 168 hours. No results for input(s): AMMONIA in the last 168 hours. Coagulation Profile: No results for input(s): INR, PROTIME in the last 168 hours. Cardiac Enzymes: No results for input(s): CKTOTAL, CKMB, CKMBINDEX, TROPONINI in the last 168 hours. BNP (last 3 results) No results for input(s): PROBNP in the last 8760 hours. HbA1C: No results for input(s): HGBA1C in the last 72 hours. CBG: No results for input(s): GLUCAP in the last 168 hours. Lipid Profile: No results for input(s): CHOL, HDL, LDLCALC, TRIG, CHOLHDL, LDLDIRECT in the last 72 hours. Thyroid Function Tests: No results for input(s): TSH, T4TOTAL, FREET4, T3FREE, THYROIDAB in the last 72 hours. Anemia Panel: No results for input(s): VITAMINB12, FOLATE, FERRITIN, TIBC, IRON, RETICCTPCT in the last 72 hours. Urine analysis:    Component Value Date/Time   COLORURINE YELLOW 09/28/2019 1104   APPEARANCEUR CLEAR 09/28/2019 1104   LABSPEC 1.025 09/28/2019 1104   PHURINE 6.0 09/28/2019 1104   GLUCOSEU NEGATIVE 09/28/2019 1104   HGBUR TRACE (A) 09/28/2019 1104   BILIRUBINUR NEGATIVE 09/28/2019 1104   [place] 09/28/2019 1104   PROTEINUR >300 (A) 09/28/2019 1104   NITRITE NEGATIVE 09/28/2019 1104   LEUKOCYTESUR NEGATIVE 09/28/2019 1104    Radiological Exams on Admission: No results found.  EKG: Not performed.  Assessment/Plan Principal Problem:   Symptomatic anemia Active Problems:   Insulin  dependent type 2 diabetes mellitus (HCC)   Hypertension associated with diabetes (HCC)   S/P CABG x 3   CAD (coronary artery disease), native coronary artery   S/P drug eluting coronary stent placement   Hyperlipidemia associated with type 2 diabetes mellitus (HCC)  LAFAYETTE DUNLEVY is a 75 y.o. male with medical history significant for CAD s/p CABG 2018, failed bypass s/p PCI with DES to RCA 06/09/2020 currently on aspirin/Plavix, IDT2DM, HTN, hyperlipidemia, chronic lower extremity edema left > right, and obesity who is  admitted with symptomatic anemia.  Symptomatic anemia: Hemoglobin 9.8 > 7.8 as an outpatient within 24-hour period.  Guaiac positive dark stool with dark red blood noted on rectal exam in the ED.  Repeat Hgb stable at 8.3.  Has been on aspirin/Plavix due to recent drug-eluting stent placement. -Hold aspirin/Plavix for now -Start IV Protonix 40 mg twice daily -Keep n.p.o. after midnight -A.m. consult to GI placed -Follow ferritin, iron studies, B12 -Repeat CBC in a.m., transfuse 1 unit PRBC if hemoglobin <8.0  CAD s/p CABG and PCI with DES to RCA 06/09/2020: Chronic appears stable.  Denies any active chest pain.  Follows with cardiology, Dr. Clayborn Bigness. -Holding aspirin/Plavix for now as above, will need to resume as soon as able -Continue Bystolic, Imdur, rosuvastatin  Insulin-dependent type 2 diabetes: Placed on reduced home Lantus 10 units nightly plus sensitive SSI. Hold home metformin and Januvia.  Hypercalcemia: Mild and noted on labs since October 2021.  Check PTH and TSH.  Hypertension: BP elevated on arrival.  Resume home Bystolic, olmesartan, Imdur.  Patient states he is no longer taking chlorthalidone.  Hyperlipidemia: Continue rosuvastatin.  Right thyroid nodule: Incidental finding on CTA chest 09/01/2020.  Outpatient dedicated thyroid ultrasound recommended.  Check TSH.  DVT prophylaxis: SCDs Code Status: Full code, confirmed with patient Family  Communication: Discussed with patient's son at bedside Disposition Plan: From home and likely discharge to home pending further evaluation/management of symptomatic anemia Consults called: AM consult to GI placed Level of care: Med-Surg Admission status:  Status is: Observation  The patient remains OBS appropriate and will d/c before 2 midnights.  Dispo: The patient is from: Home              Anticipated d/c is to: Home              Anticipated d/c date is: 1 day              Patient currently is not medically stable to d/c.   Difficult to place patient No   Zada Finders MD [facility]  If 7PM-7AM, please contact night-coverage [email or web address]  09/02/2020, 9:30 PM

## 2020-09-03 DIAGNOSIS — F5089 Other specified eating disorder: Secondary | ICD-10-CM

## 2020-09-03 DIAGNOSIS — D62 Acute posthemorrhagic anemia: Secondary | ICD-10-CM | POA: Diagnosis not present

## 2020-09-03 DIAGNOSIS — I2510 Atherosclerotic heart disease of native coronary artery without angina pectoris: Secondary | ICD-10-CM | POA: Diagnosis not present

## 2020-09-03 DIAGNOSIS — I10 Essential (primary) hypertension: Secondary | ICD-10-CM

## 2020-09-03 DIAGNOSIS — E1169 Type 2 diabetes mellitus with other specified complication: Secondary | ICD-10-CM

## 2020-09-03 DIAGNOSIS — E041 Nontoxic single thyroid nodule: Secondary | ICD-10-CM

## 2020-09-03 DIAGNOSIS — R0600 Dyspnea, unspecified: Secondary | ICD-10-CM | POA: Diagnosis not present

## 2020-09-03 DIAGNOSIS — D500 Iron deficiency anemia secondary to blood loss (chronic): Secondary | ICD-10-CM | POA: Insufficient documentation

## 2020-09-03 DIAGNOSIS — E785 Hyperlipidemia, unspecified: Secondary | ICD-10-CM

## 2020-09-03 LAB — CBC
HCT: 26.4 % — ABNORMAL LOW (ref 39.0–52.0)
Hemoglobin: 7.8 g/dL — ABNORMAL LOW (ref 13.0–17.0)
MCH: 22.5 pg — ABNORMAL LOW (ref 26.0–34.0)
MCHC: 29.5 g/dL — ABNORMAL LOW (ref 30.0–36.0)
MCV: 76.1 fL — ABNORMAL LOW (ref 80.0–100.0)
Platelets: 224 10*3/uL (ref 150–400)
RBC: 3.47 MIL/uL — ABNORMAL LOW (ref 4.22–5.81)
RDW: 16.9 % — ABNORMAL HIGH (ref 11.5–15.5)
WBC: 8.5 10*3/uL (ref 4.0–10.5)
nRBC: 0 % (ref 0.0–0.2)

## 2020-09-03 LAB — BASIC METABOLIC PANEL
Anion gap: 10 (ref 5–15)
BUN: 33 mg/dL — ABNORMAL HIGH (ref 8–23)
CO2: 26 mmol/L (ref 22–32)
Calcium: 10.9 mg/dL — ABNORMAL HIGH (ref 8.9–10.3)
Chloride: 107 mmol/L (ref 98–111)
Creatinine, Ser: 1.38 mg/dL — ABNORMAL HIGH (ref 0.61–1.24)
GFR, Estimated: 54 mL/min — ABNORMAL LOW (ref >60)
Glucose, Bld: 126 mg/dL — ABNORMAL HIGH (ref 70–99)
Potassium: 3.5 mmol/L (ref 3.5–5.1)
Sodium: 143 mmol/L (ref 135–145)

## 2020-09-03 LAB — GLUCOSE, CAPILLARY
Glucose-Capillary: 157 mg/dL — ABNORMAL HIGH (ref 70–99)
Glucose-Capillary: 187 mg/dL — ABNORMAL HIGH (ref 70–99)

## 2020-09-03 LAB — TSH: TSH: 3.235 u[IU]/mL (ref 0.350–4.500)

## 2020-09-03 LAB — HEMOGLOBIN A1C
Hgb A1c MFr Bld: 6.6 % — ABNORMAL HIGH (ref 4.8–5.6)
Mean Plasma Glucose: 142.72 mg/dL

## 2020-09-03 LAB — PREPARE RBC (CROSSMATCH)

## 2020-09-03 LAB — SARS CORONAVIRUS 2 (TAT 6-24 HRS): SARS Coronavirus 2: POSITIVE — AB

## 2020-09-03 LAB — VITAMIN B12: Vitamin B-12: 141 pg/mL — ABNORMAL LOW (ref 180–914)

## 2020-09-03 LAB — CBG MONITORING, ED
Glucose-Capillary: 128 mg/dL — ABNORMAL HIGH (ref 70–99)
Glucose-Capillary: 238 mg/dL — ABNORMAL HIGH (ref 70–99)

## 2020-09-03 LAB — FERRITIN: Ferritin: 10 ng/mL — ABNORMAL LOW (ref 24–336)

## 2020-09-03 LAB — VITAMIN D 25 HYDROXY (VIT D DEFICIENCY, FRACTURES): Vit D, 25-Hydroxy: 25.81 ng/mL — ABNORMAL LOW (ref 30–100)

## 2020-09-03 LAB — IRON AND TIBC
Iron: 17 ug/dL — ABNORMAL LOW (ref 45–182)
Saturation Ratios: 4 % — ABNORMAL LOW (ref 17.9–39.5)
TIBC: 447 ug/dL (ref 250–450)
UIBC: 430 ug/dL

## 2020-09-03 LAB — ABO/RH: ABO/RH(D): A POS

## 2020-09-03 LAB — TESTOSTERONE: Testosterone: 399 ng/dL (ref 264–916)

## 2020-09-03 MED ORDER — FUROSEMIDE 10 MG/ML IJ SOLN
20.0000 mg | Freq: Once | INTRAMUSCULAR | Status: AC
  Administered 2020-09-03: 20 mg via INTRAVENOUS
  Filled 2020-09-03: qty 4

## 2020-09-03 MED ORDER — CLOPIDOGREL BISULFATE 75 MG PO TABS
75.0000 mg | ORAL_TABLET | Freq: Every day | ORAL | Status: DC
  Administered 2020-09-03: 75 mg via ORAL
  Filled 2020-09-03: qty 1

## 2020-09-03 MED ORDER — SODIUM CHLORIDE 0.9 % IV SOLN
400.0000 mg | Freq: Once | INTRAVENOUS | Status: AC
  Administered 2020-09-03: 400 mg via INTRAVENOUS
  Filled 2020-09-03 (×2): qty 20

## 2020-09-03 MED ORDER — ASPIRIN EC 81 MG PO TBEC
81.0000 mg | DELAYED_RELEASE_TABLET | Freq: Every day | ORAL | Status: DC
  Filled 2020-09-03: qty 1

## 2020-09-03 MED ORDER — SODIUM CHLORIDE 0.9% IV SOLUTION
Freq: Once | INTRAVENOUS | Status: AC
  Filled 2020-09-03: qty 250

## 2020-09-03 MED ORDER — ACETAMINOPHEN 325 MG PO TABS
650.0000 mg | ORAL_TABLET | Freq: Once | ORAL | Status: AC
  Administered 2020-09-03: 650 mg via ORAL
  Filled 2020-09-03: qty 2

## 2020-09-03 MED ORDER — SODIUM BICARBONATE 650 MG PO TABS: 650.0000 mg | ORAL_TABLET | Freq: Three times a day (TID) | ORAL | Status: DC

## 2020-09-03 NOTE — Consult Note (Signed)
[name] , MD [address], [place], [place], [place], [id number], [place], [place], [place], [address] Phone: [phone]  Fax: [phone]  Consultation  Referring Provider:     Dr. Vito Berger primary Care Physician:  Albina Billet, MD Primary Gastroenterologist: None         Reason for Consultation:     Iron deficiency anemia  Date of Admission:  09/02/2020 Date of Consultation:  09/03/2020         HPI:   MARCELIS WISSNER is a 75 y.o. male presented to the emergency room last night with shortness of breath and anemia.  He has a history of coronary artery disease status post CABG in 2018, PCI in 2021 October on aspirin and Plavix.  Presented to the emergency room for anemia.  Progressive shortness of breath for over 2 weeks.  At presentation his hemoglobin was 8.3 g and this morning is 7.8 g with an MCV of 76.113 days back his hemoglobin is 8.7 g with an MCV of 77.9 and even 6 months back he was anemic with a hemoglobin of 10.7 g.B12 is in process.  Creatinine of 1.38 BUN of 33 TSH normal.  Iron studies demonstrate a ferritin of 10 TIBC of 447.  Chest x-ray on 08/21/2020 showed no abnormalities.  He denies any NSAID use , says for the past 1 day has had dark stools almost black, denies any abdominal pain. Shortness of breath is better after coming into the hospital. Had a colonoscopy over 5 years back , had a large polyp that had to be taken out with surgery and had a foot of his colon resected, did not have a follow up colonoscopy as he had his CABG at that time. Denies any abdominal pain.   Past Medical History:  Diagnosis Date  . Arthritis   . Coronary artery disease   . Diabetes mellitus without complication ([place])   . GERD (gastroesophageal reflux disease)   . Sleep apnea     Past Surgical History:  Procedure Laterality Date  . APPENDECTOMY    . CARDIAC CATHETERIZATION Left 08/16/2016   Procedure: Left Heart Cath and Coronary Angiography;  Surgeon: Yolonda Kida, MD;  Location: [facility];  Service: Cardiovascular;  Laterality: Left;  . COLON SURGERY    . CORONARY ARTERY BYPASS GRAFT  08/30/2016   triple  . CORONARY STENT INTERVENTION N/A 06/09/2020   Procedure: CORONARY STENT INTERVENTION;  Surgeon: Yolonda Kida, MD;  Location: [facility];  Service: Cardiovascular;  Laterality: N/A;  . LEFT HEART CATH AND CORONARY ANGIOGRAPHY Left 06/09/2020   Procedure: LEFT HEART CATH AND CORONARY ANGIOGRAPHY;  Surgeon: Yolonda Kida, MD;  Location: [facility];  Service: Cardiovascular;  Laterality: Left;  . LEFT HEART CATH AND CORS/GRAFTS ANGIOGRAPHY Left 11/20/2016   Procedure: Left Heart Cath and Cors/Grafts Angiography;  Surgeon: Yolonda Kida, MD;  Location: [facility];  Service: Cardiovascular;  Laterality: Left;    Prior to Admission medications   Medication Sig Start Date End Date Taking? Authorizing Provider  amLODipine (NORVASC) 10 MG tablet Take 10 mg by mouth daily.   Yes [provider]  aspirin EC 81 MG tablet Take 81 mg by mouth daily.   Yes [provider]  chlorthalidone (HYGROTON) 25 MG tablet Take 12.5 mg by mouth daily. 04/12/20 04/12/21 Yes [provider]  clopidogrel (PLAVIX) 75 MG tablet Take 75 mg by mouth daily. 06/27/20  Yes [provider]  insulin glargine (LANTUS) 100 UNIT/ML injection Inject 25 Units into the skin at bedtime.    Yes [provider]  metFORMIN (GLUCOPHAGE-XR) 500 MG 24 hr tablet Take 500 mg by mouth 2 (two) times daily.   Yes [provider]  nebivolol (BYSTOLIC) 10 MG tablet Take 10 mg by mouth daily.  06/29/19 09/03/20 Yes [provider]  omeprazole (PRILOSEC) 40 MG capsule Take 40 mg by mouth daily.   Yes [provider]  rosuvastatin (CRESTOR) 40 MG tablet Take 40 mg by mouth daily. 06/22/20 06/22/21 Yes [provider]  sitaGLIPtin (JANUVIA) 100 MG tablet Take 100 mg by  mouth daily.   Yes [provider]  Testosterone 1.62 % GEL APPLY 2 PUMPS TO THE SKIN DAILY Patient taking differently: Apply 2 Pump topically daily. 05/29/20  Yes McGowan, Larene Beach A, PA-C  triamcinolone (KENALOG) 0.025 % cream Apply 1 application topically daily.  06/23/19  Yes [provider]  predniSONE (DELTASONE) 50 MG tablet Take 1 tablet (50 mg total) by mouth daily with breakfast. Patient not taking: Reported on 09/03/2020 08/21/20   Cuthriell, Charline Bills, PA-C    History reviewed. No pertinent family history.   Social History   Tobacco Use  . Smoking status: Never Smoker  . Smokeless tobacco: Never Used  Substance Use Topics  . Alcohol use: No  . Drug use: No    Allergies as of 09/02/2020  . (No Known Allergies)    Review of Systems:    All systems reviewed and negative except where noted in HPI.   Physical Exam:  Vital signs in last 24 hours: Temp:  [97.6 F (36.4 C)-98.2 F (36.8 C)] 98 F (36.7 C) (01/22 0942) Pulse Rate:  [62-83] 68 (01/22 0942) Resp:  [15-20] 18 (01/22 0942) BP: (147-182)/(70-95) 147/75 (01/22 0942) SpO2:  [96 %-100 %] 97 % (01/22 0942) Weight:  [117.9 kg] 117.9 kg (01/21 1531)   General:   Pleasant, cooperative in NAD Head:  Normocephalic and atraumatic. Eyes:   No icterus.   Conjunctiva pink. PERRLA. Ears:  Normal auditory acuity. Neck:  Supple; no masses or thyroidomegaly Lungs: Respirations even and unlabored. Lungs clear to auscultation bilaterally.   No wheezes, crackles, or rhonchi.  Heart:  Regular rate and rhythm;  Without murmur, clicks, rubs or gallops Abdomen:  Soft, nondistended, nontender. Normal bowel sounds. No appreciable masses or hepatomegaly.  No rebound or guarding.  Neurologic:  Alert and oriented x3;  grossly normal neurologically. Skin:  Intact without significant lesions or rashes. Cervical Nodes:  No significant cervical adenopathy. Psych:  Alert and cooperative. Normal affect.  LAB  RESULTS: Recent Labs    09/02/20 1534 09/02/20 1932 09/03/20 0330  WBC 8.9  --  8.5  HGB 8.2* 8.3* 7.8*  HCT 27.8* 27.9* 26.4*  PLT 226  --  224   BMET Recent Labs    09/02/20 1534 09/03/20 0330  NA 145 143  K 4.3 3.5  CL 107 107  CO2 23 26  GLUCOSE 104* 126*  BUN 36* 33*  CREATININE 1.38* 1.38*  CALCIUM 11.1* 10.9*   LFT Recent Labs    09/02/20 1534  PROT 7.0  ALBUMIN 4.0  AST 23  ALT 18  ALKPHOS 69  BILITOT 0.7   PT/INR No results for input(s): LABPROT, INR in the last 72 hours.  STUDIES: No results found.    Impression / Plan:   VICENT FEBLES is a 76 y.o. y/o male with history  of coronary artery disease status post CABG in 2018 and coronary angioplasty with stenting in October 2021 on aspirin Plavix.  Presented to the emergency room with shortness of breath and was found to have significant microcytic severely iron deficient anemia.  Reviewing his labs it is evident that he has gradually been dropping his hemoglobin for over 6 months.  And this is likely a chronic process that has continued over some time.  Unlikely to be an acute drop.  The anemia has been compounded by the use of aspirin and Plavix which has been started recently.h/o of colon polyp, required surgery of his colon some years back and didn't have a surveillance colonoscopy.    Plan 1.  Monitor CBC and transfuse 2.  Suggest IV iron 3.  Follow-up B12 levels and if low please replace. 4.  With iron deficiency anemia he would require endoscopy as well as colonoscopy.  Antiplatellet agents have been held - once off for between 3-5 days ( Dr Bonna Gains will decide the timing on Tuesday  ) will then proceed with procedures. 5.   No role for checking stool occult testing.  It is only meant for colon cancer screening in the outpatient and has no other role in any other setting.   Thank you for involving me in the care of this patient.      LOS: 0 days   Jerry Bellows, MD  09/03/2020, 9:44 AM

## 2020-09-03 NOTE — ED Notes (Signed)
Pt oob to hallway bathroom - ambulates independently with slow but steady gait - RR even and unlabored on RA; No acute distress noted.

## 2020-09-03 NOTE — ED Notes (Signed)
cbg 238

## 2020-09-03 NOTE — ED Notes (Signed)
Pt ate 100% breakfast. Alert and oriented, clear speech, NAD noted, RR even and unlabored

## 2020-09-03 NOTE — Consult Note (Signed)
[facility] Consultation Note  Patient ID: [name], MRN: [id number], DOB/AGE: [birth year] 75 y.o. Admit date: 09/02/2020   Date of Consult: 09/03/2020 Primary Physician: Albina Billet, MD Primary Cardiologist: [facility]  Chief Complaint:  Chief Complaint  Patient presents with  . Anemia   Reason for Consult: Anemia  HPI: 75 y.o. male with known coronary artery disease status post coronary artery bypass graft in the remote past with a recent stent placement of the native right coronary artery due to anginal symptoms but without evidence of myocardial infarction in October 2021.  He does have hypertension and hyperlipidemia for which he is done fairly well.  He is on appropriate medication management.  He has been on dual antiplatelet therapy including Brilinta and then switching to Plavix in recent month.  With this the patient has had significant weakness and fatigue and shortness of breath and concerns of issues for which she has had been found to be anemic.  There is significant anemia appears to have primary concerns.  Appears a he has not hemoglobin of 7.8 a chest x-ray which is normal without evidence of changes CT scan showing normal.  His glomerular filtration rate is 54.  Currently he is stable and feeling well without any cardiovascular symptoms.  EKG is pending Past Medical History:  Diagnosis Date  . Arthritis   . Coronary artery disease   . Diabetes mellitus without complication ([place])   . GERD (gastroesophageal reflux disease)   . Sleep apnea       Surgical History:  Past Surgical History:  Procedure Laterality Date  . APPENDECTOMY    . CARDIAC CATHETERIZATION Left 08/16/2016   Procedure: Left Heart Cath and Coronary Angiography;  Surgeon: Yolonda Kida, MD;  Location: [facility];  Service: Cardiovascular;  Laterality: Left;  . COLON SURGERY    . CORONARY ARTERY BYPASS GRAFT  08/30/2016   triple  . CORONARY STENT INTERVENTION N/A 06/09/2020    Procedure: CORONARY STENT INTERVENTION;  Surgeon: Yolonda Kida, MD;  Location: [facility];  Service: Cardiovascular;  Laterality: N/A;  . LEFT HEART CATH AND CORONARY ANGIOGRAPHY Left 06/09/2020   Procedure: LEFT HEART CATH AND CORONARY ANGIOGRAPHY;  Surgeon: Yolonda Kida, MD;  Location: [facility];  Service: Cardiovascular;  Laterality: Left;  . LEFT HEART CATH AND CORS/GRAFTS ANGIOGRAPHY Left 11/20/2016   Procedure: Left Heart Cath and Cors/Grafts Angiography;  Surgeon: Yolonda Kida, MD;  Location: [facility];  Service: Cardiovascular;  Laterality: Left;     Home Meds: Prior to Admission medications   Medication Sig Start Date End Date Taking? Authorizing Provider  amLODipine (NORVASC) 10 MG tablet Take 10 mg by mouth daily.   Yes [provider]  aspirin EC 81 MG tablet Take 81 mg by mouth daily.   Yes [provider]  chlorthalidone (HYGROTON) 25 MG tablet Take 12.5 mg by mouth daily. 04/12/20 04/12/21 Yes [provider]  clopidogrel (PLAVIX) 75 MG tablet Take 75 mg by mouth daily. 06/27/20  Yes [provider]  insulin glargine (LANTUS) 100 UNIT/ML injection Inject 25 Units into the skin at bedtime.    Yes [provider]  metFORMIN (GLUCOPHAGE-XR) 500 MG 24 hr tablet Take 500 mg by mouth 2 (two) times daily.   Yes [provider]  nebivolol (BYSTOLIC) 10 MG tablet Take 10 mg by mouth daily.  06/29/19 09/03/20 Yes [provider]  omeprazole (PRILOSEC) 40 MG capsule Take 40  mg by mouth daily.   Yes [provider]  rosuvastatin (CRESTOR) 40 MG tablet Take 40 mg by mouth daily. 06/22/20 06/22/21 Yes [provider]  sitaGLIPtin (JANUVIA) 100 MG tablet Take 100 mg by mouth daily.   Yes [provider]  Testosterone 1.62 % GEL APPLY 2 PUMPS TO THE SKIN DAILY Patient taking differently: Apply 2 Pump topically daily. 05/29/20  Yes McGowan, Larene Beach A, PA-C   triamcinolone (KENALOG) 0.025 % cream Apply 1 application topically daily.  06/23/19  Yes [provider]  predniSONE (DELTASONE) 50 MG tablet Take 1 tablet (50 mg total) by mouth daily with breakfast. Patient not taking: Reported on 09/03/2020 08/21/20   Cuthriell, Charline Bills, PA-C    Inpatient Medications:  . amLODipine  10 mg Oral Daily  . clopidogrel  75 mg Oral Daily  . insulin aspart  0-9 Units Subcutaneous TID WC  . insulin glargine  10 Units Subcutaneous QHS  . irbesartan  300 mg Oral Daily  . isosorbide mononitrate  60 mg Oral Daily  . nebivolol  10 mg Oral Daily  . pantoprazole (PROTONIX) IV  40 mg Intravenous BID  . rosuvastatin  40 mg Oral q1800   . iron sucrose      Allergies: No Known Allergies  Social History   Socioeconomic History  . Marital status: Married    Spouse name: Not on file  . Number of children: Not on file  . Years of education: Not on file  . Highest education level: Not on file  Occupational History  . Not on file  Tobacco Use  . Smoking status: Never Smoker  . Smokeless tobacco: Never Used  Substance and Sexual Activity  . Alcohol use: No  . Drug use: No  . Sexual activity: Not on file  Other Topics Concern  . Not on file  Social History Narrative  . Not on file   Social Determinants of Health   Financial Resource Strain: Not on file  Food Insecurity: Not on file  Transportation Needs: Not on file  Physical Activity: Not on file  Stress: Not on file  Social Connections: Not on file  Intimate Partner Violence: Not on file     History reviewed. No pertinent family history.   Review of Systems Positive for weakness fatigue Negative for: General:  chills, fever, night sweats or weight changes.  Cardiovascular: PND orthopnea syncope dizziness  Dermatological skin lesions rashes Respiratory: Cough congestion Urologic: Frequent urination urination at night and hematuria Abdominal: negative for nausea, vomiting,  diarrhea, bright red blood per rectum, melena, or hematemesis Neurologic: negative for visual changes, and/or hearing changes  All other systems reviewed and are otherwise negative except as noted above.  Labs: No results for input(s): CKTOTAL, CKMB, TROPONINI in the last 72 hours. Lab Results  Component Value Date   WBC 8.5 09/03/2020   HGB 7.8 (L) 09/03/2020   HCT 26.4 (L) 09/03/2020   MCV 76.1 (L) 09/03/2020   PLT 224 09/03/2020    Recent Labs  Lab 09/02/20 1534 09/03/20 0330  NA 145 143  K 4.3 3.5  CL 107 107  CO2 23 26  BUN 36* 33*  CREATININE 1.38* 1.38*  CALCIUM 11.1* 10.9*  PROT 7.0  --   BILITOT 0.7  --   ALKPHOS 69  --   ALT 18  --   AST 23  --   GLUCOSE 104* 126*   No results found for: CHOL, HDL, LDLCALC, TRIG No results found for: DDIMER  Radiology/Studies:  DG Chest 2 View  Result Date: 08/21/2020 CLINICAL DATA:  Cough and shortness of breath EXAM: CHEST - 2 VIEW COMPARISON:  02/11/2017 FINDINGS: Previous median sternotomy and CABG. Mild cardiomegaly. Aortic atherosclerosis and tortuosity. No evidence of heart failure or effusion. Mild chronic scarring at the lung bases. IMPRESSION: No active disease. Previous CABG. Mild cardiomegaly. Aortic atherosclerosis. Electronically Signed   By: Nelson Chimes M.D.   On: 08/21/2020 14:06    EKG:   Weights: Filed Weights   09/02/20 1531  Weight: 117.9 kg     Physical Exam: Blood pressure (!) 147/75, pulse 68, temperature 98 F (36.7 C), temperature source Oral, resp. rate 18, height 6\' 1"  (1.854 m), weight 117.9 kg, SpO2 97 %. Body mass index is 34.3 kg/m. General: Well developed, well nourished, in no acute distress. Head eyes ears nose throat: Normocephalic, atraumatic, sclera non-icteric, no xanthomas, nares are without discharge. No apparent thyromegaly and/or mass  Lungs: Normal respiratory effort.  no wheezes, no rales, no rhonchi.  Heart: RRR with normal S1 S2. no murmur gallop, no rub, PMI is normal  size and placement, carotid upstroke normal without bruit, jugular venous pressure is normal Abdomen: Soft, non-tender, non-distended with normoactive bowel sounds. No hepatomegaly. No rebound/guarding. No obvious abdominal masses. Abdominal aorta is normal size without bruit Extremities: No edema. no cyanosis, no clubbing, no ulcers  Peripheral : 2+ bilateral upper extremity pulses, 2+ bilateral femoral pulses, 2+ bilateral dorsal pedal pulse Neuro: Alert and oriented. No facial asymmetry. No focal deficit. Moves all extremities spontaneously. Musculoskeletal: Normal muscle tone without kyphosis Psych:  Responds to questions appropriately with a normal affect.    Assessment: 75 year old male with hypertension hyperlipidemia and previous stenting of right coronary artery status post coronary bypass graft in the remote past with weakness fatigue and most consistent with new onset anemia and bleeding without evidence of acute coronary syndrome myocardial infarction or congestive heart failure.  Plan: 1.  Packed red blood cells for acute anemia for which she may improve significant symptoms of weakness and fatigue 2.  No further cardiac diagnostics necessary at this time due to no evidence of congestive heart failure or acute coronary syndrome and/or myocardial infarction or true angina 3.  Abstain from antiplatelet medication management until further evaluation of source of bleeding and then would consider aspirin alone 4.  Proceed to endoscopy colonoscopy as necessary for further evaluation of bleeding and bruising complications 5.  High intensity cholesterol therapy, blood pressure control with current outpatient medication management  Signed, Corey Skains M.D. [facility] 09/03/2020, 10:42 AM

## 2020-09-03 NOTE — ED Notes (Signed)
Pt ambulatory to restroom, steady gait, NAD noted 

## 2020-09-03 NOTE — Progress Notes (Signed)
Patient ID: [name], male   DOB: 1946/01/07, 75 y.o.   MRN: [id number] [facility] PROGRESS NOTE  KAELEB EMOND [id number] DOB: 12-Apr-1946 DOA: 09/02/2020 PCP: Albina Billet, MD  HPI/Subjective: Patient feeling more short of breath with walking around.  Some cough.  Seen in [facility] ED on 08/21/2020 and COVID test was negative and troponin was negative.  Hemoglobin 8.7 at that time.  Patient was seen at [facility] ED on 09/01/2020 and had lower extremity Doppler negative and CTA chest negative.  Objective: Vitals:   09/03/20 1100 09/03/20 1210  BP: (!) 139/93 (!) 155/80  Pulse: 71 68  Resp:  17  Temp:  (!) 97.5 F (36.4 C)  SpO2: 96% 99%    Intake/Output Summary (Last 24 hours) at 09/03/2020 1218 Last data filed at 09/03/2020 1216 Gross per 24 hour  Intake 150 ml  Output --  Net 150 ml   Filed Weights   09/02/20 1531  Weight: 117.9 kg    ROS: Review of Systems  Respiratory: Positive for shortness of breath.   Cardiovascular: Negative for chest pain.  Gastrointestinal: Negative for abdominal pain, nausea and vomiting.  Musculoskeletal: Negative for joint pain.   Exam: Physical Exam HENT:     Head: Normocephalic.     Mouth/Throat:     Pharynx: No oropharyngeal exudate.  Eyes:     General: Lids are normal.     Conjunctiva/sclera: Conjunctivae normal.  Cardiovascular:     Rate and Rhythm: Normal rate and regular rhythm.     Heart sounds: Normal heart sounds, S1 normal and S2 normal.  Pulmonary:     Breath sounds: No decreased breath sounds, wheezing, rhonchi or rales.  Abdominal:     Palpations: Abdomen is soft.     Tenderness: There is no abdominal tenderness.  Musculoskeletal:     Right lower leg: Swelling present.     Left lower leg: Swelling present.  Skin:    General: Skin is warm.     Findings: No rash.  Neurological:     Mental Status: He is alert and oriented to person, place, and time.       Data Reviewed: Basic Metabolic Panel: Recent Labs   Lab 09/02/20 1534 09/03/20 0330  NA 145 143  K 4.3 3.5  CL 107 107  CO2 23 26  GLUCOSE 104* 126*  BUN 36* 33*  CREATININE 1.38* 1.38*  CALCIUM 11.1* 10.9*   Liver Function Tests: Recent Labs  Lab 09/02/20 1534  AST 23  ALT 18  ALKPHOS 69  BILITOT 0.7  PROT 7.0  ALBUMIN 4.0   CBC: Recent Labs  Lab 09/02/20 1156 09/02/20 1534 09/02/20 1932 09/03/20 0330  WBC  --  8.9  --  8.5  NEUTROABS  --  7.1  --   --   HGB 7.8* 8.2* 8.3* 7.8*  HCT 27.4* 27.8* 27.9* 26.4*  MCV  --  76.2*  --  76.1*  PLT  --  226  --  224   BNP (last 3 results) Recent Labs    05/24/20 1533  BNP 142.4*     CBG: Recent Labs  Lab 09/03/20 0741 09/03/20 1115  GLUCAP 128* 238*   Scheduled Meds: . amLODipine  10 mg Oral Daily  . [START ON 09/04/2020] aspirin EC  81 mg Oral Daily  . furosemide  20 mg Intravenous Once  . insulin aspart  0-9 Units Subcutaneous TID WC  . insulin glargine  10 Units Subcutaneous QHS  . irbesartan  300 mg Oral Daily  . isosorbide mononitrate  60 mg Oral Daily  . nebivolol  10 mg Oral Daily  . pantoprazole (PROTONIX) IV  40 mg Intravenous BID  . rosuvastatin  40 mg Oral q1800   Continuous Infusions: . iron sucrose      Assessment/Plan:  1. Acute on chronic blood loss anemia.  Symptomatic iron deficiency anemia.  We will transfuse 1 unit of packed red blood cells today benefits and risks explained to patient.  We will also give IV iron this evening.  Case discussed with gastroenterology Dr. Vicente Males And okay to feed him today and continue Plavix.  I will hold aspirin.  Patient on IV Protonix. 2. History of CAD with recent stent 06/09/2020.  Continue Plavix.  Hold aspirin.  On Bystolic, Imdur and Crestor.  Case discussed with Dr. Nehemiah Massed covering for Dr. Clayborn Bigness. 3. Type 2 diabetes mellitus with hyperlipidemia.  On Crestor.  On Lantus insulin and sliding scale.  Hold oral medications. 4. Hypercalcemia.  Check PTH, PTH RP, SPEP and vitamin D level.  TSH  normal. 5. Essential hypertension on Bystolic, irbesartan and Imdur 6. Right thyroid nodule on CT scan of the chest at [facility].  Will need outpatient thyroid ultrasound. 7. Pica.  With severe iron deficiency anemia I advised to stop eating ice.        Code Status:     Code Status Orders  (From admission, onward)         Start     Ordered   09/02/20 2139  Full code  Continuous        09/02/20 2140        Code Status History    Date Active Date Inactive Code Status Order ID Comments User Context   06/09/2020 1411 06/10/2020 1720 Full Code [id number]  Yolonda Kida, MD Inpatient   11/20/2016 1418 11/20/2016 1911 Full Code [id number]  Yolonda Kida, MD Inpatient   08/16/2016 1608 08/16/2016 1946 Full Code [id number]  Yolonda Kida, MD Inpatient   Advance Care Planning Activity     Family Communication: Spoke with wife on the phone Disposition Plan: Status is: Observation  Dispo: The patient is from: Home              Anticipated d/c is to: Home              Anticipated d/c date is: Dependent on repeat hemoglobin and how he is feeling and whether or not GI wants to do procedure while here.              Patient currently receiving blood transfusion today and will receive IV iron this evening    Time spent: 29 minutes, case discussed with GI and cardiology  [place]

## 2020-09-03 NOTE — ED Notes (Signed)
Report to nicole, rn.

## 2020-09-03 NOTE — ED Notes (Signed)
Pt GCS 15 oriented to new environment -- RR even and unlabored on RA with symmetrical rise and fall of chest.  Denies any immediate needs, questions concerns-- call bell in reach.  Will monitor for acute changes and maintain plan of care.

## 2020-09-03 NOTE — ED Notes (Signed)
Pt provided lunch tray.

## 2020-09-04 DIAGNOSIS — N183 Chronic kidney disease, stage 3 unspecified: Secondary | ICD-10-CM | POA: Diagnosis present

## 2020-09-04 DIAGNOSIS — D500 Iron deficiency anemia secondary to blood loss (chronic): Secondary | ICD-10-CM | POA: Diagnosis present

## 2020-09-04 DIAGNOSIS — D519 Vitamin B12 deficiency anemia, unspecified: Secondary | ICD-10-CM

## 2020-09-04 DIAGNOSIS — Z7901 Long term (current) use of anticoagulants: Secondary | ICD-10-CM | POA: Diagnosis not present

## 2020-09-04 DIAGNOSIS — E1122 Type 2 diabetes mellitus with diabetic chronic kidney disease: Secondary | ICD-10-CM | POA: Diagnosis present

## 2020-09-04 DIAGNOSIS — K635 Polyp of colon: Secondary | ICD-10-CM | POA: Diagnosis present

## 2020-09-04 DIAGNOSIS — Z79890 Hormone replacement therapy (postmenopausal): Secondary | ICD-10-CM | POA: Diagnosis not present

## 2020-09-04 DIAGNOSIS — E7849 Other hyperlipidemia: Secondary | ICD-10-CM | POA: Diagnosis present

## 2020-09-04 DIAGNOSIS — U071 COVID-19: Secondary | ICD-10-CM

## 2020-09-04 DIAGNOSIS — D490 Neoplasm of unspecified behavior of digestive system: Secondary | ICD-10-CM | POA: Diagnosis present

## 2020-09-04 DIAGNOSIS — E876 Hypokalemia: Secondary | ICD-10-CM | POA: Diagnosis present

## 2020-09-04 DIAGNOSIS — D62 Acute posthemorrhagic anemia: Secondary | ICD-10-CM | POA: Diagnosis present

## 2020-09-04 DIAGNOSIS — K644 Residual hemorrhoidal skin tags: Secondary | ICD-10-CM | POA: Diagnosis present

## 2020-09-04 DIAGNOSIS — E041 Nontoxic single thyroid nodule: Secondary | ICD-10-CM | POA: Diagnosis present

## 2020-09-04 DIAGNOSIS — D649 Anemia, unspecified: Secondary | ICD-10-CM | POA: Diagnosis present

## 2020-09-04 DIAGNOSIS — M1990 Unspecified osteoarthritis, unspecified site: Secondary | ICD-10-CM | POA: Diagnosis present

## 2020-09-04 DIAGNOSIS — E538 Deficiency of other specified B group vitamins: Secondary | ICD-10-CM | POA: Diagnosis present

## 2020-09-04 DIAGNOSIS — E1169 Type 2 diabetes mellitus with other specified complication: Secondary | ICD-10-CM | POA: Diagnosis present

## 2020-09-04 DIAGNOSIS — D518 Other vitamin B12 deficiency anemias: Secondary | ICD-10-CM | POA: Diagnosis not present

## 2020-09-04 DIAGNOSIS — K219 Gastro-esophageal reflux disease without esophagitis: Secondary | ICD-10-CM | POA: Diagnosis present

## 2020-09-04 DIAGNOSIS — I129 Hypertensive chronic kidney disease with stage 1 through stage 4 chronic kidney disease, or unspecified chronic kidney disease: Secondary | ICD-10-CM | POA: Diagnosis present

## 2020-09-04 DIAGNOSIS — Z7982 Long term (current) use of aspirin: Secondary | ICD-10-CM | POA: Diagnosis not present

## 2020-09-04 DIAGNOSIS — F5089 Other specified eating disorder: Secondary | ICD-10-CM | POA: Diagnosis not present

## 2020-09-04 DIAGNOSIS — I25119 Atherosclerotic heart disease of native coronary artery with unspecified angina pectoris: Secondary | ICD-10-CM | POA: Diagnosis present

## 2020-09-04 DIAGNOSIS — K317 Polyp of stomach and duodenum: Secondary | ICD-10-CM | POA: Diagnosis present

## 2020-09-04 DIAGNOSIS — I2510 Atherosclerotic heart disease of native coronary artery without angina pectoris: Secondary | ICD-10-CM | POA: Diagnosis not present

## 2020-09-04 DIAGNOSIS — K648 Other hemorrhoids: Secondary | ICD-10-CM | POA: Diagnosis present

## 2020-09-04 DIAGNOSIS — K3189 Other diseases of stomach and duodenum: Secondary | ICD-10-CM | POA: Diagnosis not present

## 2020-09-04 LAB — GLUCOSE, CAPILLARY
Glucose-Capillary: 137 mg/dL — ABNORMAL HIGH (ref 70–99)
Glucose-Capillary: 122 mg/dL — ABNORMAL HIGH (ref 70–99)
Glucose-Capillary: 148 mg/dL — ABNORMAL HIGH (ref 70–99)
Glucose-Capillary: 117 mg/dL — ABNORMAL HIGH (ref 70–99)

## 2020-09-04 LAB — BASIC METABOLIC PANEL
Anion gap: 11 (ref 5–15)
BUN: 28 mg/dL — ABNORMAL HIGH (ref 8–23)
CO2: 26 mmol/L (ref 22–32)
Calcium: 10.2 mg/dL (ref 8.9–10.3)
Chloride: 107 mmol/L (ref 98–111)
Creatinine, Ser: 1.38 mg/dL — ABNORMAL HIGH (ref 0.61–1.24)
GFR, Estimated: 54 mL/min — ABNORMAL LOW (ref >60)
Glucose, Bld: 176 mg/dL — ABNORMAL HIGH (ref 70–99)
Potassium: 3.6 mmol/L (ref 3.5–5.1)
Sodium: 144 mmol/L (ref 135–145)

## 2020-09-04 LAB — FIBRIN DERIVATIVES D-DIMER (ARMC ONLY): Fibrin derivatives D-dimer (ARMC): 614.76 ng/mL (FEU) — ABNORMAL HIGH (ref 0.00–499.00)

## 2020-09-04 LAB — CBC
HCT: 26.9 % — ABNORMAL LOW (ref 39.0–52.0)
Hemoglobin: 8.1 g/dL — ABNORMAL LOW (ref 13.0–17.0)
MCH: 23.3 pg — ABNORMAL LOW (ref 26.0–34.0)
MCHC: 30.1 g/dL (ref 30.0–36.0)
MCV: 77.3 fL — ABNORMAL LOW (ref 80.0–100.0)
Platelets: 153 10*3/uL (ref 150–400)
RBC: 3.48 MIL/uL — ABNORMAL LOW (ref 4.22–5.81)
RDW: 17.2 % — ABNORMAL HIGH (ref 11.5–15.5)
WBC: 6.7 10*3/uL (ref 4.0–10.5)
nRBC: 0 % (ref 0.0–0.2)

## 2020-09-04 LAB — C-REACTIVE PROTEIN: CRP: <0.5 mg/dL (ref <1.0)

## 2020-09-04 MED ORDER — SODIUM CHLORIDE 0.9 % IV SOLN
100.0000 mg | Freq: Every day | INTRAVENOUS | Status: AC
  Administered 2020-09-05 – 2020-09-06 (×2): 100 mg via INTRAVENOUS
  Filled 2020-09-04 (×2): qty 100

## 2020-09-04 MED ORDER — ZINC SULFATE 220 (50 ZN) MG PO CAPS
220.0000 mg | ORAL_CAPSULE | Freq: Every day | ORAL | Status: DC
  Administered 2020-09-04 – 2020-09-08 (×5): 220 mg via ORAL
  Filled 2020-09-04 (×5): qty 1

## 2020-09-04 MED ORDER — CYANOCOBALAMIN 1000 MCG/ML IJ SOLN
1000.0000 ug | Freq: Every day | INTRAMUSCULAR | Status: AC
  Administered 2020-09-04 – 2020-09-06 (×3): 1000 ug via INTRAMUSCULAR
  Filled 2020-09-04 (×3): qty 1

## 2020-09-04 MED ORDER — SODIUM CHLORIDE 0.9 % IV SOLN
200.0000 mg | Freq: Once | INTRAVENOUS | Status: AC
  Administered 2020-09-04: 200 mg via INTRAVENOUS
  Filled 2020-09-04: qty 200
  Filled 2020-09-04: qty 40

## 2020-09-04 MED ORDER — ASPIRIN EC 81 MG PO TBEC
81.0000 mg | DELAYED_RELEASE_TABLET | Freq: Every day | ORAL | Status: DC
  Filled 2020-09-04: qty 1

## 2020-09-04 MED ORDER — SODIUM CHLORIDE 0.9 % IV SOLN
400.0000 mg | Freq: Once | INTRAVENOUS | Status: AC
  Administered 2020-09-05: 400 mg via INTRAVENOUS
  Filled 2020-09-04: qty 20

## 2020-09-04 MED ORDER — VITAMIN D 25 MCG (1000 UNIT) PO TABS
1000.0000 [IU] | ORAL_TABLET | Freq: Every day | ORAL | Status: DC
  Administered 2020-09-04 – 2020-09-08 (×5): 1000 [IU] via ORAL
  Filled 2020-09-04 (×5): qty 1

## 2020-09-04 NOTE — Progress Notes (Incomplete)
09/05/2020 10:20 PM   [name] [birth year] [id number]  Referring provider: Albina Billet, MD [address]   [place],  [place] [address]  No chief complaint on file.  Urological history 1. Testosterone deficiency - managing with AndroGel 1.62%, 2 pumps daily - no longer having spontaneous erections at night - untreated sleep apnea - testosterone 399 in 08/2020  2. BPH with LU TS - PSA 0.43 in 08/2020 - I PSS *** - PVR *** - had urethroplasty as a child  3. ED - SHIM *** - contributing factors of age, BPH, testosterone deficiency, DM, HTN, HLD, sleep apnea and CAD - managed with   HPI: [name] is a 75 y.o. male who presents today for a 6 month follow up.        Score:  1-7 Mild 8-19 Moderate 20-35 Severe     PMH: Past Medical History:  Diagnosis Date  . Arthritis   . Coronary artery disease   . Diabetes mellitus without complication ([name])   . GERD (gastroesophageal reflux disease)   . Sleep apnea     Surgical History: Past Surgical History:  Procedure Laterality Date  . APPENDECTOMY    . CARDIAC CATHETERIZATION Left 08/16/2016   Procedure: Left Heart Cath and Coronary Angiography;  Surgeon: Yolonda Kida, MD;  Location: [facility];  Service: Cardiovascular;  Laterality: Left;  . COLON SURGERY    . CORONARY ARTERY BYPASS GRAFT  08/30/2016   triple  . CORONARY STENT INTERVENTION N/A 06/09/2020   Procedure: CORONARY STENT INTERVENTION;  Surgeon: Yolonda Kida, MD;  Location: [facility];  Service: Cardiovascular;  Laterality: N/A;  . LEFT HEART CATH AND CORONARY ANGIOGRAPHY Left 06/09/2020   Procedure: LEFT HEART CATH AND CORONARY ANGIOGRAPHY;  Surgeon: Yolonda Kida, MD;  Location: [facility];  Service: Cardiovascular;  Laterality: Left;  . LEFT HEART CATH AND CORS/GRAFTS ANGIOGRAPHY Left 11/20/2016   Procedure: Left Heart Cath and Cors/Grafts Angiography;  Surgeon: Yolonda Kida, MD;   Location: [facility];  Service: Cardiovascular;  Laterality: Left;    Home Medications:  Allergies as of 09/05/2020   No Known Allergies     Medication List    Notice   This visit is during an admission. Changes to the med list made in this visit will be reflected in the After Visit Summary of the admission.     Allergies:  No Known Allergies  Family History: No family history on file.  Social History:  reports that he has never smoked. He has never used smokeless tobacco. He reports that he does not drink alcohol and does not use drugs.  ROS: Pertinent ROS in HPI  Physical Exam: There were no vitals taken for this visit.  Constitutional:  Well nourished. Alert and oriented, No acute distress. HEENT: [place] AT, moist mucus membranes.  Trachea midline Cardiovascular: No clubbing, cyanosis, or edema. Respiratory: Normal respiratory effort, no increased work of breathing. GI: Abdomen is soft, non tender, non distended, no abdominal masses. Liver and spleen not palpable.  No hernias appreciated.  Stool sample for occult testing is not indicated.   GU: No CVA tenderness.  No bladder fullness or masses.  Patient with circumcised/uncircumcised phallus. ***Foreskin easily retracted***  Urethral meatus is patent.  No penile discharge. No penile lesions or rashes. Scrotum without lesions, cysts, rashes and/or edema.  Testicles are located scrotally bilaterally. No masses are appreciated in the testicles. Left and  right epididymis are normal. Rectal: Patient with  normal sphincter tone. Anus and perineum without scarring or rashes. No rectal masses are appreciated. Prostate is approximately *** grams, *** nodules are appreciated. Seminal vesicles are normal. Skin: No rashes, bruises or suspicious lesions. Lymph: No inguinal adenopathy. Neurologic: Grossly intact, no focal deficits, moving all 4 extremities. Psychiatric: Normal mood and affect.  Laboratory Data: Component      Latest Ref Rng & Units 08/25/2019 03/04/2020  Prostatic Specific Antigen     0.00 - 4.00 ng/mL 0.40 0.49   Lab Results  Component Value Date   WBC 6.7 09/04/2020   HGB 8.1 (L) 09/04/2020   HCT 26.9 (L) 09/04/2020   MCV 77.3 (L) 09/04/2020   PLT 153 09/04/2020    Lab Results  Component Value Date   CREATININE 1.38 (H) 09/04/2020    Lab Results  Component Value Date   TESTOSTERONE 399 09/02/2020    Lab Results  Component Value Date   AST 23 09/02/2020   Lab Results  Component Value Date   ALT 18 09/02/2020    Urinalysis *** I have reviewed the labs.   Pertinent Imaging: ***   I have independently reviewed the films.    Assessment & Plan:    1. Testosterone deficiency  Testosterone within normal limits Continue AndroGel 1.62%, 2 pumps daily RTC in 6 months for HCT/HBG, testosterone and exam  2. BPH with LUTS IPSS score is 13/2, it is stable Continue conservative management, avoiding bladder irritants and timed voiding's Most bothersome symptom is frequency and nocturia which is definitely worsened due to his comorbidities, but we will have a trial of Gemtesa 75 mg, # 28 samples given RTC in 6 months for IPSS, PSA, PVR and exam, as testosterone therapy can cause prostate enlargement and worsen LUTS  3. Erectile dysfunction:    SHIM score is 5    No follow-ups on file.  These notes generated with voice recognition software. I apologize for typographical errors.  Zara Council, PA-C  [facility] [address]  [place], [place] [address] [phone]

## 2020-09-04 NOTE — Progress Notes (Signed)
Patient ID: [name], male   DOB: 23-Oct-1945, 75 y.o.   MRN: [id number] [facility] PROGRESS NOTE  [name] NLG:[id number] DOB: 1946-01-10 DOA: 09/02/2020 PCP: Albina Billet, MD  HPI/Subjective: Patient feeling a little bit better after transfusion yesterday and iron infusion.  The 6 to 24-hour COVID test came back positive last night.  Patient is not hypoxic.  Recent CT scan of the chest negative for pneumonia.  Came in with symptomatic anemia  Objective: Vitals:   09/04/20 0004 09/04/20 0743  BP: 139/73 (!) 147/79  Pulse: 74 75  Resp: 18 18  Temp: 97.8 F (36.6 C) 98.4 F (36.9 C)  SpO2: 98% 100%    Intake/Output Summary (Last 24 hours) at 09/04/2020 1425 Last data filed at 09/04/2020 0446 Gross per 24 hour  Intake 270 ml  Output --  Net 270 ml   Filed Weights   09/02/20 1531  Weight: 117.9 kg    ROS: Review of Systems  Respiratory: Negative for cough and shortness of breath.   Cardiovascular: Negative for chest pain.  Gastrointestinal: Negative for abdominal pain, nausea and vomiting.   Exam: Physical Exam HENT:     Head: Normocephalic.     Mouth/Throat:     Pharynx: No oropharyngeal exudate.  Eyes:     General: Lids are normal.     Conjunctiva/sclera: Conjunctivae normal.  Cardiovascular:     Rate and Rhythm: Normal rate and regular rhythm.     Heart sounds: Normal heart sounds, S1 normal and S2 normal.  Pulmonary:     Breath sounds: Normal breath sounds. No decreased breath sounds, wheezing or rales.  Abdominal:     Palpations: Abdomen is soft.     Tenderness: There is no abdominal tenderness.  Musculoskeletal:     Right lower leg: No swelling.     Left lower leg: No swelling.  Skin:    General: Skin is warm.     Findings: No rash.  Neurological:     Mental Status: He is alert and oriented to person, place, and time.       Data Reviewed: Basic Metabolic Panel: Recent Labs  Lab 09/02/20 1534 09/03/20 0330 09/04/20 0414  NA  145 143 144  K 4.3 3.5 3.6  CL 107 107 107  CO2 23 26 26   GLUCOSE 104* 126* 176*  BUN 36* 33* 28*  CREATININE 1.38* 1.38* 1.38*  CALCIUM 11.1* 10.9* 10.2   Liver Function Tests: Recent Labs  Lab 09/02/20 1534  AST 23  ALT 18  ALKPHOS 69  BILITOT 0.7  PROT 7.0  ALBUMIN 4.0   CBC: Recent Labs  Lab 09/02/20 1156 09/02/20 1534 09/02/20 1932 09/03/20 0330 09/04/20 0414  WBC  --  8.9  --  8.5 6.7  NEUTROABS  --  7.1  --   --   --   HGB 7.8* 8.2* 8.3* 7.8* 8.1*  HCT 27.4* 27.8* 27.9* 26.4* 26.9*  MCV  --  76.2*  --  76.1* 77.3*  PLT  --  226  --  224 153   CBG: Recent Labs  Lab 09/03/20 1115 09/03/20 1759 09/03/20 2039 09/04/20 0743 09/04/20 1259  GLUCAP 238* 157* 187* 137* 122*    Recent Results (from the past 240 hour(s))  SARS CORONAVIRUS 2 (TAT 6-24 HRS) Nasopharyngeal Nasopharyngeal Swab     Status: Abnormal   Collection Time: 09/02/20 10:05 PM   Specimen: Nasopharyngeal Swab  Result Value Ref Range Status   SARS Coronavirus 2 POSITIVE (A)  NEGATIVE Final    Comment: (NOTE) SARS-CoV-2 target nucleic acids are DETECTED.  The SARS-CoV-2 RNA is generally detectable in upper and lower respiratory specimens during the acute phase of infection. Positive results are indicative of the presence of SARS-CoV-2 RNA. Clinical correlation with patient history and other diagnostic information is  necessary to determine patient infection status. Positive results do not rule out bacterial infection or co-infection with other viruses.  The expected result is Negative.  Fact Sheet for Patients: SugarRoll.be  Fact Sheet for Healthcare Providers: https://www.woods-mathews.com/  This test is not yet approved or cleared by the [place] FDA and  has been authorized for detection and/or diagnosis of SARS-CoV-2 by FDA under an Emergency Use Authorization (EUA). This EUA will remain  in effect (meaning this test can be used) for  the duration of the COVID-19 declaration under Section 564(b)(1) of the Act, 21 U. S.C. section 360bbb-3(b)(1), unless the authorization is terminated or revoked sooner.   Performed at [facility], [place] [address], [place], [place] [address]      Scheduled Meds: . amLODipine  10 mg Oral Daily  . cholecalciferol  1,000 Units Oral Daily  . cyanocobalamin  1,000 mcg Intramuscular Daily  . insulin aspart  0-9 Units Subcutaneous TID WC  . insulin glargine  10 Units Subcutaneous QHS  . irbesartan  300 mg Oral Daily  . nebivolol  10 mg Oral Daily  . pantoprazole (PROTONIX) IV  40 mg Intravenous BID  . rosuvastatin  40 mg Oral q1800  . zinc sulfate  220 mg Oral Daily   Continuous Infusions: . [START ON 09/05/2020] iron sucrose    . remdesivir 200 mg in sodium chloride 0.9% 250 mL IVPB     Followed by  . [START ON 09/05/2020] remdesivir 100 mg in NS 100 mL      Assessment/Plan:  1. Acute on chronic blood loss anemia.  Symptomatic iron deficiency anemia.  Patient received 1 unit of packed red blood cells and had a poor response.  Hemoglobin only up to 8.1.  I did give IV iron yesterday.  We will recheck hemoglobin tomorrow morning.  We will give IV iron again tomorrow morning.  Case discussed with gastroenterology and they will decide on whether or not to do EGD and colonoscopy while here based on next hemoglobin and how he is doing.  Cardiology okay with discontinuing Plavix. 2. COVID-19 infection.  Starting remdesivir today.  Hold off on steroids since not hypoxic. 3. History of CAD with recent stent 06/09/2020.  Cardiology okay with discontinuing Plavix and continuing aspirin.  Continue Bystolic, Crestor and Imdur. 4. Type 2 diabetes mellitus with hyperlipidemia on Crestor.  On low-dose Lantus. 5. Hypercalcemia.  Calcium improved with fluids.  Vitamin D level little low will replace that.  Still awaiting PTH, PTH RP and serum protein electrophoresis. 6. Vitamin B12 deficiency.  IM  B12 injections while here and oral supplementation upon disposition 7. Pica.  Stop eating ice 8. Essential hypertension on Bystolic irbesartan and Imdur 9. Right thyroid nodule on CT scan of the chest at [facility] Will need outpatient thyroid ultrasound.      Code Status:     Code Status Orders  (From admission, onward)         Start     Ordered   09/02/20 2139  Full code  Continuous        09/02/20 2140        Code Status History    Date Active  Date Inactive Code Status Order ID Comments User Context   06/09/2020 1411 06/10/2020 1720 Full Code [id number]  Yolonda Kida, MD Inpatient   11/20/2016 1418 11/20/2016 1911 Full Code [id number]  Yolonda Kida, MD Inpatient   08/16/2016 1608 08/16/2016 1946 Full Code [id number]  Yolonda Kida, MD Inpatient   Advance Care Planning Activity     Family Communication: Spoke with wife on the phone Disposition Plan: Status is: Inpatient  Dispo: The patient is from: Home              Anticipated d/c is to: Home              Anticipated d/c date is: Dependent on how he does with his blood counts and whether or not GI wants to do endoscopy and colonoscopy while here (earliest Wednesday)              Patient currently watching patient's blood counts closely   Difficult to place patient.  No patient will go home.  Time spent: 27 minutes  [place]

## 2020-09-04 NOTE — Progress Notes (Signed)
GI note.   Overnight his COVID test has returned back as positive.  B12 levels are very low in addition to very low iron levels.  Discussed with Dr. Leslye Peer since he is COVID-positive we would not rush to do any endoscopy procedures unless absolutely needed which would be an active GI bleed.  Monitor hemoglobin next 24 to 48 hours.  If stable could consider outpatient evaluation as he has had his Plavix discontinued.  At that point of time he could restart his aspirin if stable.  Obviously if his hemoglobin continues to drop we would have no choice but to perform endoscopy as an inpatient.  We will continue to follow the patient  Dr Jonathon Bellows MD,MRCP [facility]) Gastroenterology/Hepatology Pager: [phone]

## 2020-09-04 NOTE — Progress Notes (Signed)
Remdesivir - Pharmacy Brief Note   O:  ALT: 18 (09/02/20) CXR: n/a SpO2: 98% on RA   A/P:  Remdesivir 200 mg IVPB once followed by 100 mg IVPB daily x 4 days.   Chinita Greenland PharmD Clinical Pharmacist 09/04/2020

## 2020-09-04 NOTE — Progress Notes (Signed)
[facility] Encounter Note  Patient: [name] / Admit Date: 09/02/2020 / Date of Encounter: 09/04/2020, 7:45 AM   Subjective: 1/23.  Patient is overall doing much better than yesterday.  Denies shortness of breath weakness or fatigue.  No evidence of chest discomfort.  Patient has not not had any evidence of active bleeding at this time.  Patient had discontinuation of Plavix and aspirin but will potentially reinstate aspirin if able.  Patient now has COVID positive although no evidence of symptoms.  Hemoglobin increased and improved to 8.1.  Patient is continuing to be at low risk for cardiovascular complication with colonoscopy and endoscopy  Review of Systems: Positive for: None Negative for: Vision change, hearing change, syncope, dizziness, nausea, vomiting,diarrhea, bloody stool, stomach pain, cough, congestion, diaphoresis, urinary frequency, urinary pain,skin lesions, skin rashes Others previously listed  Objective: Telemetry: Normal sinus rhythm Physical Exam: Blood pressure (!) 147/79, pulse 75, temperature 98.4 F (36.9 C), resp. rate 18, height 6\' 1"  (1.854 m), weight 117.9 kg, SpO2 100 %. Body mass index is 34.3 kg/m. General: Well developed, well nourished, in no acute distress. Head: Normocephalic, atraumatic, sclera non-icteric, no xanthomas, nares are without discharge. Neck: No apparent masses Lungs: Normal respirations with no wheezes, no rhonchi, no rales , no crackles   Heart: Regular rate and rhythm, normal S1 S2, no murmur, no rub, no gallop, PMI is normal size and placement, carotid upstroke normal without bruit, jugular venous pressure normal Abdomen: Soft, non-tender, non-distended with normoactive bowel sounds. No hepatosplenomegaly. Abdominal aorta is normal size without bruit Extremities: No edema, no clubbing, no cyanosis, no ulcers,  Peripheral: 2+ radial, 2+ femoral, 2+ dorsal pedal pulses Neuro: Alert and oriented. Moves all  extremities spontaneously. Psych:  Responds to questions appropriately with a normal affect.   Intake/Output Summary (Last 24 hours) at 09/04/2020 0745 Last data filed at 09/04/2020 0446 Gross per 24 hour  Intake 420 ml  Output --  Net 420 ml    Inpatient Medications:  . amLODipine  10 mg Oral Daily  . aspirin EC  81 mg Oral Daily  . cholecalciferol  1,000 Units Oral Daily  . cyanocobalamin  1,000 mcg Intramuscular Daily  . insulin aspart  0-9 Units Subcutaneous TID WC  . insulin glargine  10 Units Subcutaneous QHS  . irbesartan  300 mg Oral Daily  . nebivolol  10 mg Oral Daily  . pantoprazole (PROTONIX) IV  40 mg Intravenous BID  . rosuvastatin  40 mg Oral q1800  . zinc sulfate  220 mg Oral Daily   Infusions:  . remdesivir 200 mg in sodium chloride 0.9% 250 mL IVPB     Followed by  . [START ON 09/05/2020] remdesivir 100 mg in NS 100 mL      Labs: Recent Labs    09/03/20 0330 09/04/20 0414  NA 143 144  K 3.5 3.6  CL 107 107  CO2 26 26  GLUCOSE 126* 176*  BUN 33* 28*  CREATININE 1.38* 1.38*  CALCIUM 10.9* 10.2   Recent Labs    09/02/20 1534  AST 23  ALT 18  ALKPHOS 69  BILITOT 0.7  PROT 7.0  ALBUMIN 4.0   Recent Labs    09/02/20 1534 09/02/20 1932 09/03/20 0330 09/04/20 0414  WBC 8.9  --  8.5 6.7  NEUTROABS 7.1  --   --   --   HGB 8.2*   < > 7.8* 8.1*  HCT 27.8*   < > 26.4* 26.9*  MCV 76.2*  --  76.1* 77.3*  PLT 226  --  224 153   < > = values in this interval not displayed.   No results for input(s): CKTOTAL, CKMB, TROPONINI in the last 72 hours. Invalid input(s): POCBNP Recent Labs    09/03/20 0330  HGBA1C 6.6*     Weights: Filed Weights   09/02/20 1531  Weight: 117.9 kg     Radiology/Studies:  DG Chest 2 View  Result Date: 08/21/2020 CLINICAL DATA:  Cough and shortness of breath EXAM: CHEST - 2 VIEW COMPARISON:  02/11/2017 FINDINGS: Previous median sternotomy and CABG. Mild cardiomegaly. Aortic atherosclerosis and tortuosity. No  evidence of heart failure or effusion. Mild chronic scarring at the lung bases. IMPRESSION: No active disease. Previous CABG. Mild cardiomegaly. Aortic atherosclerosis. Electronically Signed   By: Nelson Chimes M.D.   On: 08/21/2020 14:06     Assessment and Recommendation  75 y.o. male with known coronary disease status post coronary bypass graft with a recent stent in right coronary artery 10/21 hypertension hyperlipidemia chronic kidney disease stage III with acute bleeding issues of unknown etiology but no further active bleeding and no current evidence of congestive heart failure or myocardial infarction 1.  Continue treatment of anemia and bleeding 2.  No further cardiac diagnostics necessary at this time due to no evidence of congestive heart failure or angina and/or myocardial infarction 3.  Continue medication management for further risk reduction cardiovascular event including high intensity cholesterol therapy, hypertension control, as well as diabetes control 4.  Abstain from Plavix at this time due to patient having no issues since stent placement greater than 3 months ago and potentially reinstatement of aspirin if able as per gastroenterology. 5.  Proceed to colonoscopy and endoscopy when able without restrictions due to no current evidence of cardiovascular symptoms and patient remaining at low risk  Signed, Serafina Royals M.D. FACC

## 2020-09-04 NOTE — Plan of Care (Signed)
  Problem: Education: Goal: Knowledge of General Education information will improve Description: Including pain rating scale, medication(s)/side effects and non-pharmacologic comfort measures 09/04/2020 1257 by Cristela Blue, RN Outcome: Progressing 09/04/2020 1257 by Cristela Blue, RN Outcome: Progressing   Problem: Health Behavior/Discharge Planning: Goal: Ability to manage health-related needs will improve 09/04/2020 1257 by Cristela Blue, RN Outcome: Progressing 09/04/2020 1257 by Cristela Blue, RN Outcome: Progressing   Problem: Clinical Measurements: Goal: Ability to maintain clinical measurements within normal limits will improve 09/04/2020 1257 by Cristela Blue, RN Outcome: Progressing 09/04/2020 1257 by Cristela Blue, RN Outcome: Progressing Goal: Will remain free from infection 09/04/2020 1257 by Cristela Blue, RN Outcome: Progressing 09/04/2020 1257 by Cristela Blue, RN Outcome: Progressing Goal: Diagnostic test results will improve 09/04/2020 1257 by Cristela Blue, RN Outcome: Progressing 09/04/2020 1257 by Cristela Blue, RN Outcome: Progressing Goal: Respiratory complications will improve 09/04/2020 1257 by Cristela Blue, RN Outcome: Progressing 09/04/2020 1257 by Cristela Blue, RN Outcome: Progressing Goal: Cardiovascular complication will be avoided 09/04/2020 1257 by Cristela Blue, RN Outcome: Progressing 09/04/2020 1257 by Cristela Blue, RN Outcome: Progressing   Problem: Activity: Goal: Risk for activity intolerance will decrease 09/04/2020 1257 by Cristela Blue, RN Outcome: Progressing 09/04/2020 1257 by Cristela Blue, RN Outcome: Progressing   Problem: Nutrition: Goal: Adequate nutrition will be maintained 09/04/2020 1257 by Cristela Blue, RN Outcome: Progressing 09/04/2020 1257 by Cristela Blue, RN Outcome: Progressing   Problem: Coping: Goal: Level of anxiety will decrease 09/04/2020 1257 by Cristela Blue, RN Outcome:  Progressing 09/04/2020 1257 by Cristela Blue, RN Outcome: Progressing   Problem: Elimination: Goal: Will not experience complications related to bowel motility 09/04/2020 1257 by Cristela Blue, RN Outcome: Progressing 09/04/2020 1257 by Cristela Blue, RN Outcome: Progressing Goal: Will not experience complications related to urinary retention 09/04/2020 1257 by Cristela Blue, RN Outcome: Progressing 09/04/2020 1257 by Cristela Blue, RN Outcome: Progressing   Problem: Pain Managment: Goal: General experience of comfort will improve 09/04/2020 1257 by Cristela Blue, RN Outcome: Progressing 09/04/2020 1257 by Cristela Blue, RN Outcome: Progressing   Problem: Safety: Goal: Ability to remain free from injury will improve 09/04/2020 1257 by Cristela Blue, RN Outcome: Progressing 09/04/2020 1257 by Cristela Blue, RN Outcome: Progressing   Problem: Skin Integrity: Goal: Risk for impaired skin integrity will decrease 09/04/2020 1257 by Cristela Blue, RN Outcome: Progressing 09/04/2020 1257 by Cristela Blue, RN Outcome: Progressing

## 2020-09-05 ENCOUNTER — Ambulatory Visit: Payer: Medicare HMO | Admitting: Urology

## 2020-09-05 DIAGNOSIS — N138 Other obstructive and reflux uropathy: Secondary | ICD-10-CM

## 2020-09-05 DIAGNOSIS — D518 Other vitamin B12 deficiency anemias: Secondary | ICD-10-CM

## 2020-09-05 DIAGNOSIS — D62 Acute posthemorrhagic anemia: Secondary | ICD-10-CM | POA: Diagnosis not present

## 2020-09-05 DIAGNOSIS — D500 Iron deficiency anemia secondary to blood loss (chronic): Secondary | ICD-10-CM | POA: Diagnosis not present

## 2020-09-05 DIAGNOSIS — E349 Endocrine disorder, unspecified: Secondary | ICD-10-CM

## 2020-09-05 DIAGNOSIS — N529 Male erectile dysfunction, unspecified: Secondary | ICD-10-CM

## 2020-09-05 LAB — BASIC METABOLIC PANEL
Anion gap: 11 (ref 5–15)
BUN: 16 mg/dL (ref 8–23)
CO2: 26 mmol/L (ref 22–32)
Calcium: 10.2 mg/dL (ref 8.9–10.3)
Chloride: 106 mmol/L (ref 98–111)
Creatinine, Ser: 1.24 mg/dL (ref 0.61–1.24)
GFR, Estimated: >60 mL/min (ref >60)
Glucose, Bld: 135 mg/dL — ABNORMAL HIGH (ref 70–99)
Potassium: 3.4 mmol/L — ABNORMAL LOW (ref 3.5–5.1)
Sodium: 143 mmol/L (ref 135–145)

## 2020-09-05 LAB — GLUCOSE, CAPILLARY
Glucose-Capillary: 139 mg/dL — ABNORMAL HIGH (ref 70–99)
Glucose-Capillary: 154 mg/dL — ABNORMAL HIGH (ref 70–99)
Glucose-Capillary: 156 mg/dL — ABNORMAL HIGH (ref 70–99)

## 2020-09-05 LAB — CBC
HCT: 25.7 % — ABNORMAL LOW (ref 39.0–52.0)
Hemoglobin: 7.6 g/dL — ABNORMAL LOW (ref 13.0–17.0)
MCH: 22.8 pg — ABNORMAL LOW (ref 26.0–34.0)
MCHC: 29.6 g/dL — ABNORMAL LOW (ref 30.0–36.0)
MCV: 76.9 fL — ABNORMAL LOW (ref 80.0–100.0)
Platelets: 159 10*3/uL (ref 150–400)
RBC: 3.34 MIL/uL — ABNORMAL LOW (ref 4.22–5.81)
RDW: 17.2 % — ABNORMAL HIGH (ref 11.5–15.5)
WBC: 4.3 10*3/uL (ref 4.0–10.5)
nRBC: 0 % (ref 0.0–0.2)

## 2020-09-05 LAB — PTH, INTACT AND CALCIUM
Calcium, Total (PTH): 10.8 mg/dL — ABNORMAL HIGH (ref 8.6–10.2)
PTH: 61 pg/mL (ref 15–65)

## 2020-09-05 LAB — FIBRIN DERIVATIVES D-DIMER (ARMC ONLY): Fibrin derivatives D-dimer (ARMC): 533.36 ng/mL (FEU) — ABNORMAL HIGH (ref 0.00–499.00)

## 2020-09-05 LAB — FOLATE: Folate: 17.9 ng/mL (ref >5.9)

## 2020-09-05 LAB — PREPARE RBC (CROSSMATCH)

## 2020-09-05 MED ORDER — FUROSEMIDE 10 MG/ML IJ SOLN
20.0000 mg | Freq: Once | INTRAMUSCULAR | Status: AC
  Administered 2020-09-05: 20 mg via INTRAVENOUS
  Filled 2020-09-05: qty 4

## 2020-09-05 MED ORDER — SODIUM CHLORIDE 0.9% IV SOLUTION: Freq: Once | INTRAVENOUS | Status: AC

## 2020-09-05 MED ORDER — ACETAMINOPHEN 325 MG PO TABS
650.0000 mg | ORAL_TABLET | Freq: Once | ORAL | Status: AC
  Administered 2020-09-05: 650 mg via ORAL
  Filled 2020-09-05: qty 2

## 2020-09-05 MED ORDER — VITAMIN B-12 1000 MCG PO TABS
1000.0000 ug | ORAL_TABLET | Freq: Every day | ORAL | Status: DC
  Administered 2020-09-07: 1000 ug via ORAL
  Filled 2020-09-05: qty 1

## 2020-09-05 NOTE — Progress Notes (Signed)
Patient ID: [name], male   DOB: [birth year], 75 y.o.   MRN: [id number] [facility] PROGRESS NOTE  [name] ATF:[id number] DOB: 05/29/1946 DOA: 09/02/2020 PCP: Albina Billet, MD  HPI/Subjective: Patient feels a little short of breath with moving around.  Hemoglobin dipped down to 7.6 today.  No chest pain.  States he has had black bowel movement.  Objective: Vitals:   09/05/20 1157 09/05/20 1406  BP: (!) 150/69 (!) 146/74  Pulse: (!) 59 (!) 59  Resp: 18 14  Temp: 98 F (36.7 C) 98 F (36.7 C)  SpO2: 97% 96%    Intake/Output Summary (Last 24 hours) at 09/05/2020 1426 Last data filed at 09/05/2020 1406 Gross per 24 hour  Intake 1038.33 ml  Output --  Net 1038.33 ml   Filed Weights   09/02/20 1531  Weight: 117.9 kg    ROS: Review of Systems  Respiratory: Positive for shortness of breath.   Cardiovascular: Negative for chest pain.  Gastrointestinal: Negative for abdominal pain, nausea and vomiting.   Exam: Physical Exam HENT:     Head: Normocephalic.     Mouth/Throat:     Pharynx: No oropharyngeal exudate.  Eyes:     General: Lids are normal.     Comments: Pale conjunctiva  Cardiovascular:     Rate and Rhythm: Normal rate and regular rhythm.     Heart sounds: Normal heart sounds, S1 normal and S2 normal.  Pulmonary:     Breath sounds: Examination of the right-lower field reveals decreased breath sounds. Examination of the left-lower field reveals decreased breath sounds. Decreased breath sounds present. No wheezing, rhonchi or rales.  Abdominal:     Palpations: Abdomen is soft.     Tenderness: There is no abdominal tenderness.  Musculoskeletal:     Right lower leg: No swelling.     Left lower leg: No swelling.  Skin:    General: Skin is warm.     Findings: No rash.  Neurological:     Mental Status: He is alert and oriented to person, place, and time.       Data Reviewed: Basic Metabolic Panel: Recent Labs  Lab 09/02/20 1534  09/03/20 0330 09/03/20 0857 09/04/20 0414 09/05/20 0412  NA 145 143  --  144 143  K 4.3 3.5  --  3.6 3.4*  CL 107 107  --  107 106  CO2 23 26  --  26 26  GLUCOSE 104* 126*  --  176* 135*  BUN 36* 33*  --  28* 16  CREATININE 1.38* 1.38*  --  1.38* 1.24  CALCIUM 11.1* 10.9* 10.8* 10.2 10.2   Liver Function Tests: Recent Labs  Lab 09/02/20 1534  AST 23  ALT 18  ALKPHOS 69  BILITOT 0.7  PROT 7.0  ALBUMIN 4.0  CBC: Recent Labs  Lab 09/02/20 1534 09/02/20 1932 09/03/20 0330 09/04/20 0414 09/05/20 0412  WBC 8.9  --  8.5 6.7 4.3  NEUTROABS 7.1  --   --   --   --   HGB 8.2* 8.3* 7.8* 8.1* 7.6*  HCT 27.8* 27.9* 26.4* 26.9* 25.7*  MCV 76.2*  --  76.1* 77.3* 76.9*  PLT 226  --  224 153 159   BNP (last 3 results) Recent Labs    05/24/20 1533  BNP 142.4*    CBG: Recent Labs  Lab 09/04/20 1259 09/04/20 1644 09/04/20 2014 09/05/20 0758 09/05/20 1155  GLUCAP 122* 148* 117* 139* 154*    Recent Results (  from the past 240 hour(s))  SARS CORONAVIRUS 2 (TAT 6-24 HRS) Nasopharyngeal Nasopharyngeal Swab     Status: Abnormal   Collection Time: 09/02/20 10:05 PM   Specimen: Nasopharyngeal Swab  Result Value Ref Range Status   SARS Coronavirus 2 POSITIVE (A) NEGATIVE Final    Comment: (NOTE) SARS-CoV-2 target nucleic acids are DETECTED.  The SARS-CoV-2 RNA is generally detectable in upper and lower respiratory specimens during the acute phase of infection. Positive results are indicative of the presence of SARS-CoV-2 RNA. Clinical correlation with patient history and other diagnostic information is  necessary to determine patient infection status. Positive results do not rule out bacterial infection or co-infection with other viruses.  The expected result is Negative.  Fact Sheet for Patients: SugarRoll.be  Fact Sheet for Healthcare Providers: https://www.woods-mathews.com/  This test is not yet approved or cleared by the  [place] FDA and  has been authorized for detection and/or diagnosis of SARS-CoV-2 by FDA under an Emergency Use Authorization (EUA). This EUA will remain  in effect (meaning this test can be used) for the duration of the COVID-19 declaration under Section 564(b)(1) of the Act, 21 U. S.C. section 360bbb-3(b)(1), unless the authorization is terminated or revoked sooner.   Performed at [facility], [place] [address] [place], [place] [address]       Scheduled Meds: . amLODipine  10 mg Oral Daily  . aspirin EC  81 mg Oral Daily  . cholecalciferol  1,000 Units Oral Daily  . cyanocobalamin  1,000 mcg Intramuscular Daily  . insulin aspart  0-9 Units Subcutaneous TID WC  . insulin glargine  10 Units Subcutaneous QHS  . irbesartan  300 mg Oral Daily  . nebivolol  10 mg Oral Daily  . pantoprazole (PROTONIX) IV  40 mg Intravenous BID  . rosuvastatin  40 mg Oral q1800  . zinc sulfate  220 mg Oral Daily   Continuous Infusions: . remdesivir 100 mg in NS 100 mL 100 mg (09/05/20 1047)    Assessment/Plan:  1. Acute on chronic blood loss anemia.  Patient's hemoglobin dipped down to 7.6 today.  Giving another unit of blood today and also a second iron infusion.  Continue to monitor hemoglobin.  Case discussed with gastroenterology and they will do an EGD and colonoscopy on Wednesday after Plavix out of the system. 2. COVID-19 infection day 2 of 3 of remdesivir.  Hold off on steroids since not hypoxic. 3. History of CAD with recent stent on 06/09/2020.  Cardiology okay with discontinuing Plavix and just using aspirin as outpatient.  Continue Bystolic Crestor and Imdur.  Will hold aspirin prior to colonoscopy. 4. Vitamin B12 deficiency.  IM B12 injections for 3 days and then oral supplementation after that 5. Pica.  Stop eating ice.  This can cause severe iron deficiency anemia 6. Hypercalcemia.  Calcium in the normal range.  PTH normal range.  Awaiting PTH RP and serum protein  electrophoresis.  Vitamin D level low so replacing that also. 7. Type 2 diabetes mellitus with hyperlipidemia on low-dose Lantus and Crestor. 8. Essential hypertension.  Continue Bystolic, irbesartan and Imdur 9. Right thyroid nodule on CT scan of the chest at [facility].  Will need outpatient thyroid ultrasound.  TSH normal.        Code Status:     Code Status Orders  (From admission, onward)         Start     Ordered   09/02/20 2139  Full code  Continuous  09/02/20 2140        Code Status History    Date Active Date Inactive Code Status Order ID Comments User Context   06/09/2020 1411 06/10/2020 1720 Full Code [id number]  Yolonda Kida, MD Inpatient   11/20/2016 1418 11/20/2016 1911 Full Code [id number]  Yolonda Kida, MD Inpatient   08/16/2016 1608 08/16/2016 1946 Full Code [id number]  Yolonda Kida, MD Inpatient   Advance Care Planning Activity     Family Communication: Spoke with wife on the phone Disposition Plan: Status is: Inpatient  Dispo: The patient is from: Home              Anticipated d/c is to: Home              Anticipated d/c date is: Either Wednesday evening or Thursday morning              Patient currently receiving another unit of blood and another IV iron today.  GI will do a endoscopy colonoscopy on Wednesday once Plavix out of the system.   Difficult to place patient.  No.  Patient will go home  Time spent: 27 minutes, case discussed with gastroenterology  Loletha Grayer  [facility]

## 2020-09-05 NOTE — Progress Notes (Signed)
Cephas Darby, MD [address]  [place]  [place], [place] [address]  Main: [phone]  Fax: [phone] Pager: [phone]   Subjective: Patient reports having black bowel movement yesterday His hemoglobin dropped from 8.1 to 7.6 today Patient denies chest pain, shortness of breath, cough, congestion, runny nose or any other respiratory symptoms   Objective: Vital signs in last 24 hours: Vitals:   09/05/20 1041 09/05/20 1157 09/05/20 1406 09/05/20 1637  BP: (!) 164/69 (!) 150/69 (!) 146/74 (!) 166/71  Pulse: 60 (!) 59 (!) 59 64  Resp: 14 18 14 18   Temp: 98.5 F (36.9 C) 98 F (36.7 C) 98 F (36.7 C) 97.9 F (36.6 C)  TempSrc: Oral Oral Oral Oral  SpO2: 99% 97% 96% 100%  Weight:      Height:       Weight change:   Intake/Output Summary (Last 24 hours) at 09/05/2020 1755 Last data filed at 09/05/2020 1406 Gross per 24 hour  Intake 458.33 ml  Output -  Net 458.33 ml     Exam: Heart:: Regular rate and rhythm, S1S2 present or without murmur or extra heart sounds Lungs: normal and clear to auscultation Abdomen: soft, nontender, normal bowel sounds   Lab Results: CBC Latest Ref Rng & Units 09/05/2020 09/04/2020 09/03/2020  WBC 4.0 - 10.5 K/uL 4.3 6.7 8.5  Hemoglobin 13.0 - 17.0 g/dL 7.6(L) 8.1(L) 7.8(L)  Hematocrit 39.0 - 52.0 % 25.7(L) 26.9(L) 26.4(L)  Platelets 150 - 400 K/uL 159 153 224    Micro Results: Recent Results (from the past 240 hour(s))  SARS CORONAVIRUS 2 (TAT 6-24 HRS) Nasopharyngeal Nasopharyngeal Swab     Status: Abnormal   Collection Time: 09/02/20 10:05 PM   Specimen: Nasopharyngeal Swab  Result Value Ref Range Status   SARS Coronavirus 2 POSITIVE (A) NEGATIVE Final    Comment: (NOTE) SARS-CoV-2 target nucleic acids are DETECTED.  The SARS-CoV-2 RNA is generally detectable in upper and lower respiratory specimens during the acute phase of infection. Positive results are indicative of the presence of SARS-CoV-2 RNA.  Clinical correlation with patient history and other diagnostic information is  necessary to determine patient infection status. Positive results do not rule out bacterial infection or co-infection with other viruses.  The expected result is Negative.  Fact Sheet for Patients: SugarRoll.be  Fact Sheet for Healthcare Providers: https://www.woods-mathews.com/  This test is not yet approved or cleared by the [place] FDA and  has been authorized for detection and/or diagnosis of SARS-CoV-2 by FDA under an Emergency Use Authorization (EUA). This EUA will remain  in effect (meaning this test can be used) for the duration of the COVID-19 declaration under Section 564(b)(1) of the Act, 21 U. S.C. section 360bbb-3(b)(1), unless the authorization is terminated or revoked sooner.   Performed at [facility], [place] [address] [place], [place] [address]    Studies/Results: No results found. Medications:  I have reviewed the patient's current medications. Prior to Admission:  Medications Prior to Admission  Medication Sig Dispense Refill Last Dose  . amLODipine (NORVASC) 10 MG tablet Take 10 mg by mouth daily.   09/02/2020 at 0730  . aspirin EC 81 MG tablet Take 81 mg by mouth daily.   09/02/2020 at 0730  . chlorthalidone (HYGROTON) 25 MG tablet Take 12.5 mg by mouth daily.     . clopidogrel (PLAVIX) 75 MG tablet Take 75 mg by mouth daily.   09/02/2020 at 0730  . insulin glargine (LANTUS) 100 UNIT/ML  injection Inject 25 Units into the skin at bedtime.    09/02/2020 at Unknown time  . metFORMIN (GLUCOPHAGE-XR) 500 MG 24 hr tablet Take 500 mg by mouth 2 (two) times daily.   09/02/2020 at 0730  . nebivolol (BYSTOLIC) 10 MG tablet Take 10 mg by mouth daily.    09/02/2020 at 0730  . omeprazole (PRILOSEC) 40 MG capsule Take 40 mg by mouth daily.   09/02/2020 at 0730  . rosuvastatin (CRESTOR) 40 MG tablet Take 40 mg by mouth daily.   09/02/2020 at 0730  .  sitaGLIPtin (JANUVIA) 100 MG tablet Take 100 mg by mouth daily.   09/02/2020 at 0730  . Testosterone 1.62 % GEL APPLY 2 PUMPS TO THE SKIN DAILY (Patient taking differently: Apply 2 Pump topically daily.) 225 g 0 09/02/2020 at 0730  . triamcinolone (KENALOG) 0.025 % cream Apply 1 application topically daily.    09/02/2020 at 0730  . predniSONE (DELTASONE) 50 MG tablet Take 1 tablet (50 mg total) by mouth daily with breakfast. (Patient not taking: Reported on 09/03/2020) 5 tablet 0 Completed Course at Unknown time   Scheduled: . amLODipine  10 mg Oral Daily  . cholecalciferol  1,000 Units Oral Daily  . cyanocobalamin  1,000 mcg Intramuscular Daily  . insulin aspart  0-9 Units Subcutaneous TID WC  . insulin glargine  10 Units Subcutaneous QHS  . irbesartan  300 mg Oral Daily  . nebivolol  10 mg Oral Daily  . pantoprazole (PROTONIX) IV  40 mg Intravenous BID  . rosuvastatin  40 mg Oral q1800  . [START ON 09/07/2020] vitamin B-12  1,000 mcg Oral Daily  . zinc sulfate  220 mg Oral Daily   Continuous: . remdesivir 100 mg in NS 100 mL 100 mg (09/05/20 1047)   [id number] **OR** acetaminophen, ondansetron **OR** ondansetron (ZOFRAN) IV Anti-infectives (From admission, onward)   Start     Dose/Rate Route Frequency Ordered Stop   09/05/20 1000  remdesivir 100 mg in sodium chloride 0.9 % 100 mL IVPB       "Followed by" Linked Group Details   100 mg 200 mL/hr over 30 Minutes Intravenous Daily 09/04/20 0705 09/06/20 2359   09/04/20 0900  remdesivir 200 mg in sodium chloride 0.9% 250 mL IVPB       "Followed by" Linked Group Details   200 mg 580 mL/hr over 30 Minutes Intravenous Once 09/04/20 0705 09/04/20 1617     Scheduled Meds: . amLODipine  10 mg Oral Daily  . cholecalciferol  1,000 Units Oral Daily  . cyanocobalamin  1,000 mcg Intramuscular Daily  . insulin aspart  0-9 Units Subcutaneous TID WC  . insulin glargine  10 Units Subcutaneous QHS  . irbesartan  300 mg Oral Daily  . nebivolol   10 mg Oral Daily  . pantoprazole (PROTONIX) IV  40 mg Intravenous BID  . rosuvastatin  40 mg Oral q1800  . [START ON 09/07/2020] vitamin B-12  1,000 mcg Oral Daily  . zinc sulfate  220 mg Oral Daily   Continuous Infusions: . remdesivir 100 mg in NS 100 mL 100 mg (09/05/20 1047)   PRN Meds:.acetaminophen **OR** acetaminophen, ondansetron **OR** ondansetron (ZOFRAN) IV   Assessment: Principal Problem:   Acute on chronic blood loss anemia Active Problems:   Insulin dependent type 2 diabetes mellitus (HCC)   Essential hypertension   S/P CABG x 3   CAD (coronary artery disease), native coronary artery   S/P drug eluting coronary stent placement   Hyperlipidemia associated  with type 2 diabetes mellitus ([place])   Acute blood loss anemia   COVID-19 virus infection   Anemia due to vitamin B12 deficiency  75 year old male with history of coronary disease s/p CABG in 2018, s/p PCI in 05/2020, on DAPT presented to ER with symptomatic anemia and melena.  Prior history of partial colectomy.  Patient is Covid positive, oxygenating on room air, received remdesivir on 1/23  Plan: Melena with symptomatic anemia: Uremia has been corrected Hemoglobin continues to decline, has severe iron and B12 deficiency, normal folate levels Receiving parenteral iron as well as B12 supplementation Receiving 1 unit of PRBCs today Continue Protonix 40 mg IV twice daily Given that patient has asymptomatic Covid 19 infection, ongoing melena and declining hemoglobin, recommend inpatient endoscopic evaluation.  His DAPT has been held since 1/22 Tentative plan to perform upper endoscopy and colonoscopy likely Wednesday Clear liquid diet tomorrow Discussed my recommendations with Dr.Weiting and the patient  Dr. Bonna Gains will cover from tomorrow   LOS: 1 day   Rohini Vanga 09/05/2020, 5:55 PM

## 2020-09-06 LAB — TYPE AND SCREEN
ABO/RH(D): A POS
Antibody Screen: NEGATIVE
Unit division: 0
Unit division: 0

## 2020-09-06 LAB — GLUCOSE, CAPILLARY
Glucose-Capillary: 171 mg/dL — ABNORMAL HIGH (ref 70–99)
Glucose-Capillary: 143 mg/dL — ABNORMAL HIGH (ref 70–99)
Glucose-Capillary: 139 mg/dL — ABNORMAL HIGH (ref 70–99)

## 2020-09-06 LAB — PROTEIN ELECTROPHORESIS, SERUM
A/G Ratio: 1.4 (ref 0.7–1.7)
Albumin ELP: 3.8 g/dL (ref 2.9–4.4)
Alpha-1-Globulin: 0.3 g/dL (ref 0.0–0.4)
Alpha-2-Globulin: 0.7 g/dL (ref 0.4–1.0)
Beta Globulin: 1 g/dL (ref 0.7–1.3)
Gamma Globulin: 0.7 g/dL (ref 0.4–1.8)
Globulin, Total: 2.7 g/dL (ref 2.2–3.9)
M-Spike, %: 0.3 g/dL — ABNORMAL HIGH
Total Protein ELP: 6.5 g/dL (ref 6.0–8.5)

## 2020-09-06 LAB — BPAM RBC
Blood Product Expiration Date: 202202122359
Blood Product Expiration Date: 202202182359
ISSUE DATE / TIME: 202201220921
ISSUE DATE / TIME: 202201241012
Unit Type and Rh: 6200
Unit Type and Rh: 6200

## 2020-09-06 LAB — C-REACTIVE PROTEIN
CRP: 0.8 mg/dL (ref <1.0)
CRP: 0.6 mg/dL (ref <1.0)

## 2020-09-06 LAB — CBC
HCT: 28.3 % — ABNORMAL LOW (ref 39.0–52.0)
Hemoglobin: 8.7 g/dL — ABNORMAL LOW (ref 13.0–17.0)
MCH: 24.1 pg — ABNORMAL LOW (ref 26.0–34.0)
MCHC: 30.7 g/dL (ref 30.0–36.0)
MCV: 78.4 fL — ABNORMAL LOW (ref 80.0–100.0)
Platelets: 159 10*3/uL (ref 150–400)
RBC: 3.61 MIL/uL — ABNORMAL LOW (ref 4.22–5.81)
RDW: 17.3 % — ABNORMAL HIGH (ref 11.5–15.5)
WBC: 4.9 10*3/uL (ref 4.0–10.5)
nRBC: 0.6 % — ABNORMAL HIGH (ref 0.0–0.2)

## 2020-09-06 LAB — FIBRIN DERIVATIVES D-DIMER (ARMC ONLY): Fibrin derivatives D-dimer (ARMC): 466.8 ng/mL (FEU) (ref 0.00–499.00)

## 2020-09-06 MED ORDER — SODIUM CHLORIDE 0.9 % IV SOLN
400.0000 mg | Freq: Once | INTRAVENOUS | Status: AC
  Administered 2020-09-07: 400 mg via INTRAVENOUS
  Filled 2020-09-06: qty 20

## 2020-09-06 MED ORDER — PEG 3350-KCL-NA BICARB-NACL 420 G PO SOLR
4000.0000 mL | Freq: Once | ORAL | Status: AC
  Administered 2020-09-06: 4000 mL via ORAL
  Filled 2020-09-06: qty 4000

## 2020-09-06 MED ORDER — BISACODYL 5 MG PO TBEC
10.0000 mg | DELAYED_RELEASE_TABLET | Freq: Once | ORAL | Status: AC
  Administered 2020-09-06: 10 mg via ORAL
  Filled 2020-09-06: qty 2

## 2020-09-06 NOTE — Progress Notes (Signed)
Patient ID: [name], male   DOB: [birth year], 75 y.o.   MRN: [id number] [facility] PROGRESS NOTE  [name]:[id number] DOB: May 22, 1946 DOA: 09/02/2020 PCP: Albina Billet, MD  HPI/Subjective: Patient admitted with symptomatic anemia.  He is feeling better.  He responded to blood transfusion yesterday.  His stool has been black.  No abdominal pain.  No nausea or vomiting.  GI to do procedures tomorrow.  Objective: Vitals:   09/06/20 0412 09/06/20 0755  BP: (!) 159/76 (!) 162/102  Pulse: (!) 55 (!) 56  Resp: 17 20  Temp: 98.4 F (36.9 C) 98 F (36.7 C)  SpO2: 99% 97%    Intake/Output Summary (Last 24 hours) at 09/06/2020 1633 Last data filed at 09/06/2020 0055 Gross per 24 hour  Intake 81.79 ml  Output --  Net 81.79 ml   Filed Weights   09/02/20 1531  Weight: 117.9 kg    ROS: Review of Systems  Respiratory: Negative for shortness of breath.   Cardiovascular: Negative for chest pain.  Gastrointestinal: Positive for melena. Negative for abdominal pain and vomiting.   Exam: Physical Exam HENT:     Head: Normocephalic.     Mouth/Throat:     Pharynx: No oropharyngeal exudate.  Eyes:     General: Lids are normal.     Conjunctiva/sclera: Conjunctivae normal.     Pupils: Pupils are equal, round, and reactive to light.  Cardiovascular:     Rate and Rhythm: Normal rate and regular rhythm.     Heart sounds: Normal heart sounds, S1 normal and S2 normal.  Pulmonary:     Breath sounds: Normal breath sounds. No decreased breath sounds, wheezing, rhonchi or rales.  Abdominal:     Palpations: Abdomen is soft.     Tenderness: There is no abdominal tenderness.  Musculoskeletal:     Right lower leg: No swelling.     Left lower leg: No swelling.  Skin:    General: Skin is warm.  Neurological:     Mental Status: He is alert and oriented to person, place, and time.       Data Reviewed: Basic Metabolic Panel: Recent Labs  Lab 09/02/20 1534  09/03/20 0330 09/03/20 0857 09/04/20 0414 09/05/20 0412  NA 145 143  --  144 143  K 4.3 3.5  --  3.6 3.4*  CL 107 107  --  107 106  CO2 23 26  --  26 26  GLUCOSE 104* 126*  --  176* 135*  BUN 36* 33*  --  28* 16  CREATININE 1.38* 1.38*  --  1.38* 1.24  CALCIUM 11.1* 10.9* 10.8* 10.2 10.2   Liver Function Tests: Recent Labs  Lab 09/02/20 1534  AST 23  ALT 18  ALKPHOS 69  BILITOT 0.7  PROT 7.0  ALBUMIN 4.0   CBC: Recent Labs  Lab 09/02/20 1534 09/02/20 1932 09/03/20 0330 09/04/20 0414 09/05/20 0412 09/06/20 0716  WBC 8.9  --  8.5 6.7 4.3 4.9  NEUTROABS 7.1  --   --   --   --   --   HGB 8.2* 8.3* 7.8* 8.1* 7.6* 8.7*  HCT 27.8* 27.9* 26.4* 26.9* 25.7* 28.3*  MCV 76.2*  --  76.1* 77.3* 76.9* 78.4*  PLT 226  --  224 153 159 159   BNP (last 3 results) Recent Labs    05/24/20 1533  BNP 142.4*     CBG: Recent Labs  Lab 09/04/20 2014 09/05/20 0758 09/05/20 1155 09/05/20 1635 09/05/20  2238  GLUCAP 117* 139* 154* 156* 171*    Recent Results (from the past 240 hour(s))  SARS CORONAVIRUS 2 (TAT 6-24 HRS) Nasopharyngeal Nasopharyngeal Swab     Status: Abnormal   Collection Time: 09/02/20 10:05 PM   Specimen: Nasopharyngeal Swab  Result Value Ref Range Status   SARS Coronavirus 2 POSITIVE (A) NEGATIVE Final    Comment: (NOTE) SARS-CoV-2 target nucleic acids are DETECTED.  The SARS-CoV-2 RNA is generally detectable in upper and lower respiratory specimens during the acute phase of infection. Positive results are indicative of the presence of SARS-CoV-2 RNA. Clinical correlation with patient history and other diagnostic information is  necessary to determine patient infection status. Positive results do not rule out bacterial infection or co-infection with other viruses.  The expected result is Negative.  Fact Sheet for Patients: SugarRoll.be  Fact Sheet for Healthcare  Providers: https://www.woods-mathews.com/  This test is not yet approved or cleared by the [place] FDA and  has been authorized for detection and/or diagnosis of SARS-CoV-2 by FDA under an Emergency Use Authorization (EUA). This EUA will remain  in effect (meaning this test can be used) for the duration of the COVID-19 declaration under Section 564(b)(1) of the Act, 21 U. S.C. section 360bbb-3(b)(1), unless the authorization is terminated or revoked sooner.   Performed at [facility], [place] [address], [place], [place] [address]       Scheduled Meds: . amLODipine  10 mg Oral Daily  . cholecalciferol  1,000 Units Oral Daily  . insulin aspart  0-9 Units Subcutaneous TID WC  . insulin glargine  10 Units Subcutaneous QHS  . irbesartan  300 mg Oral Daily  . nebivolol  10 mg Oral Daily  . pantoprazole (PROTONIX) IV  40 mg Intravenous BID  . polyethylene glycol-electrolytes  4,000 mL Oral Once  . rosuvastatin  40 mg Oral q1800  . [START ON 09/07/2020] vitamin B-12  1,000 mcg Oral Daily  . zinc sulfate  220 mg Oral Daily   Continuous Infusions: . [START ON 09/07/2020] iron sucrose     Brief history.  Patient admitted with shortness of breath and anemia.  Initially sent off for 6 to 24-hour Covid test and this came back positive.  He is relatively asymptomatic for this and I did give 3 days of remdesivir.  The patient's hemoglobin did drop down to 7.8 on day 2.  I gave him 1 unit of blood at that time.  He had a poor response to that unit of blood and I had to give him a second unit of blood on 09/05/2020.  The patient has also received 2 doses of IV iron I will give a third dose on the morning of 126.  Patient also eats ice and he was told not to eat ice.  His B12 level was also very low and I am giving him IM B12 injections for 3 days.  Patient will have an endoscopy and colonoscopy on 09/07/2020.  Depending on results will depend on when he can go home.  Patient does  have a history of CAD and recent stent.  Cardiology saw the patient and okay to hold Plavix at this time and can restart aspirin once done with procedure.  Assessment/Plan:  1. Acute on chronic blood loss anemia.  Patient's hemoglobin was 7.8 few days ago and given a unit of blood.  He had a poor response to that unit of blood and hemoglobin dipped down to 7.6 yesterday and given another  unit of blood.  Hemoglobin 8.7 today.  Patient given 2 doses of IV iron and I will give a third dose tomorrow on 09/07/2020.  Patient will have endoscopy and colonoscopy on 09/07/2020 by Dr. Marius Ditch.  Holding aspirin.  Cardiology okay with discontinuing Plavix. 2. COVID-19 infection.  Day 3 of 3 of remdesivir.  Hold off on steroids since not hypoxic. 3. History of CAD with recent stent on 06/09/2020.  Cardiology okay with discontinuing Plavix at this point and restarting aspirin after procedure.  Continue Bystolic, Crestor and Imdur.  Hold aspirin prior to colonoscopy. 4. Vitamin B12 deficiency.  Continue IM B12 injections for 3 days then oral supplementation upon disposition. 5. Pica.  Stop eating ice.  This can cause severe iron deficiency anemia 6. Hypercalcemia.  Calcium now in the normal range.  PTH normal range.  Still awaiting serum protein electrophoresis and PTH RP.  Vitamin D a little low will replace vitamin D. 7. Type 2 diabetes mellitus with hyperlipidemia on low-dose Lantus and Crestor 8. Essential hypertension on Bystolic, irbesartan and Imdur 9. Right thyroid nodule on CT scan of the chest at [facility].  Will need outpatient thyroid ultrasound.  TSH normal range        Code Status:     Code Status Orders  (From admission, onward)         Start     Ordered   09/02/20 2139  Full code  Continuous        09/02/20 2140        Code Status History    Date Active Date Inactive Code Status Order ID Comments User Context   06/09/2020 1411 06/10/2020 1720 Full Code [id number]  Yolonda Kida, MD  Inpatient   11/20/2016 1418 11/20/2016 1911 Full Code [id number]  Yolonda Kida, MD Inpatient   08/16/2016 1608 08/16/2016 1946 Full Code [id number]  Yolonda Kida, MD Inpatient   Advance Care Planning Activity     Family Communication: Spoke with patient's wife on the phone Disposition Plan: Status is: Inpatient  Dispo: The patient is from: Home              Anticipated d/c is to: Home              Anticipated d/c date is: Potential disposition 09/07/2020 depending on colonoscopy and endoscopy results              Patient currently requiring procedure while here   Difficult to place patient.  No.  Will go home  Time spent: 27 minutes  [place]

## 2020-09-06 NOTE — Progress Notes (Signed)
[facility] Encounter Note  Patient: [name] / Admit Date: 09/02/2020 / Date of Encounter: 09/06/2020, 1:41 PM   Subjective: 1/23.  Patient is overall doing much better than yesterday.  Denies shortness of breath weakness or fatigue.  No evidence of chest discomfort.  Patient has not not had any evidence of active bleeding at this time.  Patient had discontinuation of Plavix and aspirin but will potentially reinstate aspirin if able.  Patient now has COVID positive although no evidence of symptoms.  Hemoglobin increased and improved to 8.1.  Patient is continuing to be at low risk for cardiovascular complication with colonoscopy and endoscopy  1/25.  Patient feeling much better since admission with stabilized hemoglobin and no evidence of active bleeding at this time.  Patient has tolerated being off of his antiplatelet medication management.  Blood pressure is significantly elevated today and has been maxed out on multiple medications including beta-blocker angiotensin receptor blocker and amlodipine.  Will consider hydralazine if necessary moving forward.  Patient remains at low risk for cardiovascular complication with colonoscopy and endoscopy to assess for primary cause of bleeding complication.  Review of Systems: Positive for: None Negative for: Vision change, hearing change, syncope, dizziness, nausea, vomiting,diarrhea, bloody stool, stomach pain, cough, congestion, diaphoresis, urinary frequency, urinary pain,skin lesions, skin rashes Others previously listed  Objective: Telemetry: Normal sinus rhythm Physical Exam: Blood pressure (!) 162/102, pulse (!) 56, temperature 98 F (36.7 C), temperature source Oral, resp. rate 20, height 6\' 1"  (1.854 m), weight 117.9 kg, SpO2 97 %. Body mass index is 34.3 kg/m. General: Well developed, well nourished, in no acute distress. Head: Normocephalic, atraumatic, sclera non-icteric, no xanthomas, nares are without  discharge. Neck: No apparent masses Lungs: Normal respirations with no wheezes, no rhonchi, no rales , no crackles   Heart: Regular rate and rhythm, normal S1 S2, no murmur, no rub, no gallop, PMI is normal size and placement, carotid upstroke normal without bruit, jugular venous pressure normal Abdomen: Soft, non-tender, non-distended with normoactive bowel sounds. No hepatosplenomegaly. Abdominal aorta is normal size without bruit Extremities: No edema, no clubbing, no cyanosis, no ulcers,  Peripheral: 2+ radial, 2+ femoral, 2+ dorsal pedal pulses Neuro: Alert and oriented. Moves all extremities spontaneously. Psych:  Responds to questions appropriately with a normal affect.   Intake/Output Summary (Last 24 hours) at 09/06/2020 1341 Last data filed at 09/06/2020 0055 Gross per 24 hour  Intake 508.87 ml  Output -  Net 508.87 ml    Inpatient Medications:  . amLODipine  10 mg Oral Daily  . bisacodyl  10 mg Oral Once  . cholecalciferol  1,000 Units Oral Daily  . insulin aspart  0-9 Units Subcutaneous TID WC  . insulin glargine  10 Units Subcutaneous QHS  . irbesartan  300 mg Oral Daily  . nebivolol  10 mg Oral Daily  . pantoprazole (PROTONIX) IV  40 mg Intravenous BID  . polyethylene glycol-electrolytes  4,000 mL Oral Once  . rosuvastatin  40 mg Oral q1800  . [START ON 09/07/2020] vitamin B-12  1,000 mcg Oral Daily  . zinc sulfate  220 mg Oral Daily   Infusions:  . [START ON 09/07/2020] iron sucrose      Labs: Recent Labs    09/04/20 0414 09/05/20 0412  NA 144 143  K 3.6 3.4*  CL 107 106  CO2 26 26  GLUCOSE 176* 135*  BUN 28* 16  CREATININE 1.38* 1.24  CALCIUM 10.2 10.2   No results for  input(s): AST, ALT, ALKPHOS, BILITOT, PROT, ALBUMIN in the last 72 hours. Recent Labs    09/05/20 0412 09/06/20 0716  WBC 4.3 4.9  HGB 7.6* 8.7*  HCT 25.7* 28.3*  MCV 76.9* 78.4*  PLT 159 159   No results for input(s): CKTOTAL, CKMB, TROPONINI in the last 72 hours. Invalid  input(s): POCBNP No results for input(s): HGBA1C in the last 72 hours.   Weights: Filed Weights   09/02/20 1531  Weight: 117.9 kg     Radiology/Studies:  DG Chest 2 View  Result Date: 08/21/2020 CLINICAL DATA:  Cough and shortness of breath EXAM: CHEST - 2 VIEW COMPARISON:  02/11/2017 FINDINGS: Previous median sternotomy and CABG. Mild cardiomegaly. Aortic atherosclerosis and tortuosity. No evidence of heart failure or effusion. Mild chronic scarring at the lung bases. IMPRESSION: No active disease. Previous CABG. Mild cardiomegaly. Aortic atherosclerosis. Electronically Signed   By: Nelson Chimes M.D.   On: 08/21/2020 14:06     Assessment and Recommendation  75 y.o. male with known coronary disease status post coronary bypass graft with a recent stent in right coronary artery 10/21 hypertension hyperlipidemia chronic kidney disease stage III with acute bleeding issues of unknown etiology but no further active bleeding and no current evidence of congestive heart failure or myocardial infarction.  Patient is at lowest risk possible for cardiovascular complication and further treatment options from the cardiovascular standpoint with colonoscopy and endoscopy 1.  Continue treatment of anemia and bleeding as necessary if new active bleeding occurs 2.  No further cardiac diagnostics necessary at this time due to no evidence of congestive heart failure or angina and/or myocardial infarction 3.  Continue medication management for further risk reduction cardiovascular event including high intensity cholesterol therapy, hypertension control, as well as diabetes control.  Will continue to consider the addition of hydralazine if necessary for blood pressure control 4.  Abstain from Plavix at this time due to patient having no issues since stent placement greater than 3 months ago and potentially reinstatement of aspirin if able as per gastroenterology after endoscopy colonoscopy has revealed primary source  of bleeding. 5.  Proceed to colonoscopy and endoscopy when able without restrictions due to no current evidence of cardiovascular symptoms and patient remaining at low risk 6.  Call if further questions  Signed, Serafina Royals M.D. FACC

## 2020-09-07 ENCOUNTER — Inpatient Hospital Stay: Payer: Medicare HMO | Admitting: Certified Registered Nurse Anesthetist

## 2020-09-07 ENCOUNTER — Encounter: Payer: Self-pay | Admitting: Internal Medicine

## 2020-09-07 ENCOUNTER — Encounter
Admission: EM | Disposition: A | Discharge status: Home / Self Care | Payer: Self-pay | Source: Home / Self Care | Attending: Internal Medicine

## 2020-09-07 DIAGNOSIS — K3189 Other diseases of stomach and duodenum: Secondary | ICD-10-CM

## 2020-09-07 DIAGNOSIS — K635 Polyp of colon: Secondary | ICD-10-CM | POA: Diagnosis not present

## 2020-09-07 DIAGNOSIS — D62 Acute posthemorrhagic anemia: Secondary | ICD-10-CM | POA: Diagnosis not present

## 2020-09-07 HISTORY — PX: ESOPHAGOGASTRODUODENOSCOPY: SHX5428

## 2020-09-07 HISTORY — PX: COLONOSCOPY: SHX5424

## 2020-09-07 LAB — CBC
HCT: 30.2 % — ABNORMAL LOW (ref 39.0–52.0)
Hemoglobin: 9.3 g/dL — ABNORMAL LOW (ref 13.0–17.0)
MCH: 24.3 pg — ABNORMAL LOW (ref 26.0–34.0)
MCHC: 30.8 g/dL (ref 30.0–36.0)
MCV: 78.9 fL — ABNORMAL LOW (ref 80.0–100.0)
Platelets: 168 10*3/uL (ref 150–400)
RBC: 3.83 MIL/uL — ABNORMAL LOW (ref 4.22–5.81)
RDW: 17.6 % — ABNORMAL HIGH (ref 11.5–15.5)
WBC: 4.1 10*3/uL (ref 4.0–10.5)
nRBC: 0 % (ref 0.0–0.2)

## 2020-09-07 LAB — FIBRIN DERIVATIVES D-DIMER (ARMC ONLY): Fibrin derivatives D-dimer (ARMC): 498.75 ng/mL (FEU) (ref 0.00–499.00)

## 2020-09-07 LAB — GLUCOSE, CAPILLARY
Glucose-Capillary: 123 mg/dL — ABNORMAL HIGH (ref 70–99)
Glucose-Capillary: 128 mg/dL — ABNORMAL HIGH (ref 70–99)
Glucose-Capillary: 193 mg/dL — ABNORMAL HIGH (ref 70–99)

## 2020-09-07 LAB — C-REACTIVE PROTEIN: CRP: 0.7 mg/dL (ref <1.0)

## 2020-09-07 SURGERY — EGD (ESOPHAGOGASTRODUODENOSCOPY)
Anesthesia: General

## 2020-09-07 MED ORDER — PROPOFOL 10 MG/ML IV BOLUS
INTRAVENOUS | Status: DC | PRN
  Administered 2020-09-07 (×2): 20 mg via INTRAVENOUS
  Administered 2020-09-07: 60 mg via INTRAVENOUS

## 2020-09-07 MED ORDER — SODIUM CHLORIDE 0.9 % IV SOLN: INTRAVENOUS | Status: DC | PRN

## 2020-09-07 MED ORDER — CYANOCOBALAMIN 1000 MCG/ML IJ SOLN
1000.0000 ug | Freq: Every day | INTRAMUSCULAR | Status: DC
  Administered 2020-09-08: 1000 ug via SUBCUTANEOUS
  Filled 2020-09-07 (×2): qty 1

## 2020-09-07 MED ORDER — PROPOFOL 500 MG/50ML IV EMUL
INTRAVENOUS | Status: DC | PRN
  Administered 2020-09-07: 150 ug/kg/min via INTRAVENOUS

## 2020-09-07 MED ORDER — LIDOCAINE HCL (CARDIAC) PF 100 MG/5ML IV SOSY
PREFILLED_SYRINGE | INTRAVENOUS | Status: DC | PRN
  Administered 2020-09-07: 50 mg via INTRAVENOUS

## 2020-09-07 MED ORDER — SODIUM CHLORIDE 0.9 % IV SOLN: INTRAVENOUS | Status: DC

## 2020-09-07 NOTE — Care Management Important Message (Signed)
Important Message  Patient Details  Name: [name] MRN: [id number] Date of Birth: 08-24-1945   Medicare Important Message Given:  Yes     Shelbie Ammons, RN 09/07/2020, 4:02 PM

## 2020-09-07 NOTE — Transfer of Care (Signed)
Immediate Anesthesia Transfer of Care Note  Patient: [name]  Procedure(s) Performed: ESOPHAGOGASTRODUODENOSCOPY (EGD) (N/A ) COLONOSCOPY (N/A )  Patient Location: PACU and Endoscopy Unit  Anesthesia Type:General  Level of Consciousness: drowsy  Airway & Oxygen Therapy: Patient Spontanous Breathing and Patient connected to face mask oxygen  Post-op Assessment: Report given to RN and Post -op Vital signs reviewed and stable  Post vital signs: Reviewed and stable  Last Vitals:  Vitals Value Taken Time  BP 110/56 09/07/20  1220  Temp    Pulse 58   Resp 10   SpO2 99     Last Pain:  Vitals:   09/07/20 0817  TempSrc: Oral  PainSc:          Complications: No complications documented.

## 2020-09-07 NOTE — Anesthesia Procedure Notes (Signed)
Procedure Name: MAC Date/Time: 09/07/2020 11:30 AM Performed by: Jerrye Noble, CRNA Pre-anesthesia Checklist: Patient identified, Emergency Drugs available, Suction available and Patient being monitored Patient Re-evaluated:Patient Re-evaluated prior to induction Oxygen Delivery Method: Simple face mask

## 2020-09-07 NOTE — Anesthesia Preprocedure Evaluation (Addendum)
Anesthesia Evaluation  Patient identified by MRN, date of birth, ID band Patient awake    Reviewed: Allergy & Precautions, NPO status , Patient's Chart, lab work & pertinent test results  Airway Mallampati: II  TM Distance: >3 FB     Dental   Pulmonary sleep apnea ,    Pulmonary exam normal        Cardiovascular hypertension, + CAD  Normal cardiovascular exam     Neuro/Psych negative neurological ROS  negative psych ROS   GI/Hepatic GERD  Medicated,  Endo/Other  diabetes  Renal/GU   negative genitourinary   Musculoskeletal  (+) Arthritis , Osteoarthritis,    Abdominal Normal abdominal exam  (+)   Peds negative pediatric ROS (+)  Hematology  (+) anemia ,   Anesthesia Other Findings Past Medical History: No date: Arthritis No date: Coronary artery disease No date: Diabetes mellitus without complication (HCC) No date: GERD (gastroesophageal reflux disease) No date: Sleep apnea EF 60% with no regional wall motion abnormalities  Reproductive/Obstetrics                            Anesthesia Physical Anesthesia Plan  ASA: III  Anesthesia Plan: General   Post-op Pain Management:    Induction: Intravenous  PONV Risk Score and Plan: Propofol infusion  Airway Management Planned: Nasal Cannula  Additional Equipment:   Intra-op Plan:   Post-operative Plan:   Informed Consent: I have reviewed the patients History and Physical, chart, labs and discussed the procedure including the risks, benefits and alternatives for the proposed anesthesia with the patient or authorized representative who has indicated his/her understanding and acceptance.     Dental advisory given  Plan Discussed with: CRNA and Surgeon  Anesthesia Plan Comments:         Anesthesia Quick Evaluation

## 2020-09-07 NOTE — Op Note (Signed)
[facility] Patient Name: [name] Procedure Date: 09/07/2020 11:06 AM MRN: [id number] Account #: [id number] Date of Birth: 02/12/1946 Admit Type: Outpatient Age: 75 Room: [facility] ENDO ROOM 3 Gender: Male Note Status: Finalized Procedure:             Colonoscopy Indications:           Iron deficiency anemia Providers:             Jamela Cumbo B. Bonna Gains MD, MD Medicines:             Monitored Anesthesia Care Complications:         No immediate complications. Procedure:             Pre-Anesthesia Assessment:                        - ASA Grade Assessment: III - A patient with severe                         systemic disease.                        - Prior to the procedure, a History and Physical was                         performed, and patient medications, allergies and                         sensitivities were reviewed. The patient's tolerance                         of previous anesthesia was reviewed.                        - The risks and benefits of the procedure and the                         sedation options and risks were discussed with the                         patient. All questions were answered and informed                         consent was obtained.                        - Patient identification and proposed procedure were                         verified prior to the procedure by the physician, the                         nurse, the anesthesiologist, the anesthetist and the                         technician. The procedure was verified in the                         procedure room.  After obtaining informed consent, the colonoscope was                         passed under direct vision. Throughout the procedure,                         the patient's blood pressure, pulse, and oxygen                         saturations were monitored continuously. The                         Colonoscope was introduced  through the anus and                         advanced to the the cecum, identified by appendiceal                         orifice and ileocecal valve. The colonoscopy was                         performed with ease. The patient tolerated the                         procedure well. The quality of the bowel preparation                         was fair. Findings:      The perianal exam findings include non-thrombosed external hemorrhoids.      There was evidence of a prior side-to-side ileo-colonic anastomosis in       the cecum. This was patent and was characterized by healthy appearing       mucosa.      A 6 mm polyp was found in the sigmoid colon. The polyp was sessile. The       polyp was removed with a cold snare. Resection and retrieval were       complete.      The exam was otherwise without abnormality.      Anal papilla(e) were hypertrophied.      Non-bleeding internal hemorrhoids were found during retroflexion. Impression:            - Preparation of the colon was fair.                        - Non-thrombosed external hemorrhoids found on                         perianal exam.                        - One 6 mm polyp in the sigmoid colon, removed with a                         cold snare. Resected and retrieved.                        - The examination was otherwise normal.                        -  Anal papilla(e) were hypertrophied.                        - Non-bleeding internal hemorrhoids.                        - Due to the prep, this was not a satisfactory exam                         for colorectal cancer screening, that is evaluation                         for small or flat lesions or polyps. Patient should                         follow up in clinic after discharge to discuss need                         for future colonoscopy and colorectal cancer screening. Recommendation:        - - Repeat colonoscopy at next available appointment                         (within 3  months), with 2 day prep. Would recommend                         elective colonoscopy to be done atleast a month after                         now due to acute COVID-19 infection.                        - Refer to [place] [facility] as outpatient due to large                         hypertrophied anal papillae noted. Evaluation if this                         needs EUA to rule out polyp tissue.                        Return to GI clinic in 3-4 weeks                        - High fiber diet.                        - Continue present medications.                        - Await pathology results.                        - The findings and recommendations were discussed with                         the patient.                        - Return to primary care physician as previously  scheduled. Procedure Code(s):     --- Professional ---                        [phone], Colonoscopy, flexible; with removal of                         tumor(s), polyp(s), or other lesion(s) by snare                         technique Diagnosis Code(s):     --- Professional ---                        K64.4, Residual hemorrhoidal skin tags                        K64.8, Other hemorrhoids                        K63.5, Polyp of colon                        K62.89, Other specified diseases of anus and rectum                        D50.9, Iron deficiency anemia, unspecified CPT copyright 2019 American Medical Association. All rights reserved. The codes documented in this report are preliminary and upon coder review may  be revised to meet current compliance requirements.  Vonda Antigua, MD Margretta Sidle B. Bonna Gains MD, MD 09/07/2020 12:28:00 PM This report has been signed electronically. Number of Addenda: 0 Note Initiated On: 09/07/2020 11:06 AM Scope Withdrawal Time: 0 hours 10 minutes 50 seconds  Total Procedure Duration: 0 hours 14 minutes 28 seconds  Estimated Blood Loss:  Estimated blood loss:  none.      [facility]

## 2020-09-07 NOTE — Anesthesia Postprocedure Evaluation (Signed)
Anesthesia Post Note  Patient: [name]  Procedure(s) Performed: ESOPHAGOGASTRODUODENOSCOPY (EGD) (N/A ) COLONOSCOPY (N/A )  Patient location during evaluation: Endoscopy Anesthesia Type: General Level of consciousness: awake and alert and oriented Pain management: pain level controlled Vital Signs Assessment: post-procedure vital signs reviewed and stable Respiratory status: spontaneous breathing Cardiovascular status: blood pressure returned to baseline Anesthetic complications: no   No complications documented.   Last Vitals:  Vitals:   09/07/20 1232 09/07/20 1242  BP: 121/61 (!) 132/59  Pulse: 60 (!) 58  Resp: 16 18  Temp:    SpO2: 97% 95%    Last Pain:  Vitals:   09/07/20 1242  TempSrc:   PainSc: 0-No pain                 Alvaretta Eisenberger

## 2020-09-07 NOTE — Op Note (Signed)
[facility] Gastroenterology Patient Name: [name] Procedure Date: 09/07/2020 11:07 AM MRN: [id number] Account #: [id number] Date of Birth: 04-06-1946 Admit Type: Outpatient Age: 75 Room: [facility] ENDO ROOM 3 Gender: Male Note Status: Finalized Procedure:             Upper GI endoscopy Indications:           Iron deficiency anemia Providers:             Earmon Sherrow B. Bonna Gains MD, MD Medicines:             Monitored Anesthesia Care Complications:         No immediate complications. Procedure:             Pre-Anesthesia Assessment:                        - The risks and benefits of the procedure and the                         sedation options and risks were discussed with the                         patient. All questions were answered and informed                         consent was obtained.                        - Patient identification and proposed procedure were                         verified prior to the procedure.                        - ASA Grade Assessment: III - A patient with severe                         systemic disease.                        After obtaining informed consent, the endoscope was                         passed under direct vision. Throughout the procedure,                         the patient's blood pressure, pulse, and oxygen                         saturations were monitored continuously. The Endoscope                         was introduced through the mouth, and advanced to the                         second part of duodenum. The upper GI endoscopy was                         accomplished with ease. The patient tolerated the  procedure well. Findings:      The examined esophagus was normal.      A large, polypoid and ulcerated, non-circumferential mass with oozing       bleeding was found on the lesser curvature of the stomach. Biopsies were       taken with a cold forceps for histology.      A single 4  mm sessile polyp with no bleeding and no stigmata of recent       bleeding was found in the gastric antrum. Biopsies were taken with a       cold forceps for histology.      A single 5 mm sessile polyp with no bleeding and no stigmata of recent       bleeding was found at the incisura. Biopsies were taken with a cold       forceps for histology.      Multiple 4 to 5 mm sessile polyps with no bleeding and no stigmata of       recent bleeding were found in the gastric fundus. Biopsies were taken       with a cold forceps for histology.      The examined duodenum was normal. Impression:            - Normal esophagus.                        - Rule out malignancy, gastric tumor on the lesser                         curvature of the stomach. Biopsied.                        - A single gastric polyp. Biopsied.                        - A single gastric polyp. Biopsied.                        - Multiple gastric polyps. Biopsied.                        - Normal examined duodenum. Recommendation:        - Await pathology results.                        - Return patient to hospital ward for ongoing care.                        - Continue present medications.                        - The findings and recommendations were discussed with                         the patient.                        - Perform a colonoscopy today. Procedure Code(s):     --- Professional ---                        [phone], Esophagogastroduodenoscopy, flexible,  transoral; with biopsy, single or multiple Diagnosis Code(s):     --- Professional ---                        D49.0, Neoplasm of unspecified behavior of digestive                         system                        K31.7, Polyp of stomach and duodenum                        D50.9, Iron deficiency anemia, unspecified CPT copyright 2019 American Medical Association. All rights reserved. The codes documented in this report are preliminary and  upon coder review may  be revised to meet current compliance requirements.  Vonda Antigua, MD Margretta Sidle B. Bonna Gains MD, MD 09/07/2020 12:00:43 PM This report has been signed electronically. Number of Addenda: 0 Note Initiated On: 09/07/2020 11:07 AM Estimated Blood Loss:  Estimated blood loss: none.      [facility]

## 2020-09-07 NOTE — Progress Notes (Signed)
[name]  YYT:[id number] DOB: 1946/04/25 DOA: 09/02/2020 PCP: Albina Billet, MD    Brief Narrative:  75 year old with a history of CAD status post CABG 2018, failed bypass status post PCI with DES to RCA October 2021 (on ASA + Plavix), DM 2, HTN, HLD, obesity, and chronic lower extremity edema who presented to the ED with 2 weeks of shortness of breath. He was initially seen in the ED 1/9 at which time a Covid test was negative and his hemoglobin was noted to be 8.7. He was subsequently evaluated in the [facility] ED 1/20 where he was noted to have asymmetric edema with venous duplex unremarkable for DVT. CTA chest was reportedly negative for PE and CXR was clear. Hemoglobin was found to be 9.8. He again was Covid negative. He had follow-up labs obtained 1/21 at which time he was found to have a hemoglobin of 7.8 and was therefore advised to present to the ED.  Significant Events:  1/21 admit via ED  Date of Positive COVID Test:  1/21  Vaccination Status: Unknown  COVID-19 specific Treatment: Remdesivir 1/23 > 1/25  Antimicrobials:  SCDs  DVT prophylaxis: SCDs  Subjective: Resting comfortably in his room post EGD/colonoscopy.  No new complaints.  Is quite hungry.  Assessment & Plan:  Symptomatic anemia of acute blood loss - occult GI bleed Baseline hemoglobin approximately 9.8 -presented with hemoglobin 7.8 -guaiac positive dark stool -holding aspirin and Plavix for now -continue Protonix -GI completed endoscopic evaluation today   Recent Labs  Lab 09/03/20 0330 09/04/20 0414 09/05/20 0412 09/06/20 0716 09/07/20 0745  HGB 7.8* 8.1* 7.6* 8.7* 9.3*     CAD status post CABG and PCI with DES to RCA October 2021 Cardiology consulted -okay to hold aspirin and Plavix for now but plan to resume as soon as possible  Covid positive incidentally noted and essentially asymptomatic at this time -met criteria for 3-day course of Remdesivir which has been provided   Recent Labs   Lab 09/02/20 1534 09/02/20 1932 09/04/20 0717 09/05/20 0412 09/06/20 0716  FERRITIN  --  10*  --   --   --   CRP  --   --  <0.5 0.8 0.6  ALT 18  --   --   --   --      Hypercalcemia noted in labs October 2021 -will need outpatient follow-up  Mild hypokalemia Likely simply due to poor intake -supplement and recheck in a.m.  B12 deficiency  B12 141 - supplement w/ SQ load   HTN continue usual home medical therapy  HLD continue usual home medical therapy  R thyroid nodule incidentally noted on CTA chest September 01, 2020 -suggest outpatient dedicated thyroid ultrasound -TSH normal    Code Status: FULL CODE Family Communication:  Status is: Inpatient  Remains inpatient appropriate because:Inpatient level of care appropriate due to severity of illness   Dispo: The patient is from: Home              Anticipated d/c is to: Home              Anticipated d/c date is: 1 day              Patient currently is not medically stable to d/c.   Difficult to place patient No   Consultants:  GI Cardiology   Objective: Blood pressure (!) 172/77, pulse (!) 57, temperature 97.8 F (36.6 C), temperature source Oral, resp. rate 15, height '6\' 1"'  (1.854 m), weight  117.9 kg, SpO2 98 %. No intake or output data in the 24 hours ending 09/07/20 0954 Filed Weights   09/02/20 1531  Weight: 117.9 kg    Examination: General: No acute respiratory distress Lungs: Clear to auscultation bilaterally without wheezes or crackles Cardiovascular: Regular rate and rhythm without murmur gallop or rub normal S1 and S2 Abdomen: Nontender, nondistended, soft, bowel sounds positive, no rebound, no ascites, no appreciable mass Extremities: No significant cyanosis, clubbing, or edema bilateral lower extremities  CBC: Recent Labs  Lab 09/02/20 1534 09/02/20 1932 09/05/20 0412 09/06/20 0716 09/07/20 0745  WBC 8.9   < > 4.3 4.9 4.1  NEUTROABS 7.1  --   --   --   --   HGB 8.2*   < > 7.6* 8.7*  9.3*  HCT 27.8*   < > 25.7* 28.3* 30.2*  MCV 76.2*   < > 76.9* 78.4* 78.9*  PLT 226   < > 159 159 168   < > = values in this interval not displayed.   Basic Metabolic Panel: Recent Labs  Lab 09/03/20 0330 09/03/20 0857 09/04/20 0414 09/05/20 0412  NA 143  --  144 143  K 3.5  --  3.6 3.4*  CL 107  --  107 106  CO2 26  --  26 26  GLUCOSE 126*  --  176* 135*  BUN 33*  --  28* 16  CREATININE 1.38*  --  1.38* 1.24  CALCIUM 10.9* 10.8* 10.2 10.2   GFR: Estimated Creatinine Clearance: 72.4 mL/min (by C-G formula based on SCr of 1.24 mg/dL).  Liver Function Tests: Recent Labs  Lab 09/02/20 1534  AST 23  ALT 18  ALKPHOS 69  BILITOT 0.7  PROT 7.0  ALBUMIN 4.0    HbA1C: Hgb A1c MFr Bld  Date/Time Value Ref Range Status  09/03/2020 03:30 AM 6.6 (H) 4.8 - 5.6 % Final    Comment:    (NOTE) Pre diabetes:          5.7%-6.4%  Diabetes:              >6.4%  Glycemic control for   <7.0% adults with diabetes     CBG: Recent Labs  Lab 09/05/20 1635 09/05/20 2238 09/06/20 1704 09/06/20 2027 09/07/20 0754  GLUCAP 156* 171* 143* 139* 123*    Recent Results (from the past 240 hour(s))  SARS CORONAVIRUS 2 (TAT 6-24 HRS) Nasopharyngeal Nasopharyngeal Swab     Status: Abnormal   Collection Time: 09/02/20 10:05 PM   Specimen: Nasopharyngeal Swab  Result Value Ref Range Status   SARS Coronavirus 2 POSITIVE (A) NEGATIVE Final    Comment: (NOTE) SARS-CoV-2 target nucleic acids are DETECTED.  The SARS-CoV-2 RNA is generally detectable in upper and lower respiratory specimens during the acute phase of infection. Positive results are indicative of the presence of SARS-CoV-2 RNA. Clinical correlation with patient history and other diagnostic information is  necessary to determine patient infection status. Positive results do not rule out bacterial infection or co-infection with other viruses.  The expected result is Negative.  Fact Sheet for  Patients: SugarRoll.be  Fact Sheet for Healthcare Providers: https://www.woods-mathews.com/  This test is not yet approved or cleared by the [place] FDA and  has been authorized for detection and/or diagnosis of SARS-CoV-2 by FDA under an Emergency Use Authorization (EUA). This EUA will remain  in effect (meaning this test can be used) for the duration of the COVID-19 declaration under Section 564(b)(1) of the Act, 21  [place] section 360bbb-3(b)(1), unless the authorization is terminated or revoked sooner.   Performed at [facility], [place] [address], [place], [place] [address]      Scheduled Meds: . amLODipine  10 mg Oral Daily  . cholecalciferol  1,000 Units Oral Daily  . insulin aspart  0-9 Units Subcutaneous TID WC  . insulin glargine  10 Units Subcutaneous QHS  . irbesartan  300 mg Oral Daily  . nebivolol  10 mg Oral Daily  . pantoprazole (PROTONIX) IV  40 mg Intravenous BID  . rosuvastatin  40 mg Oral q1800  . vitamin B-12  1,000 mcg Oral Daily  . zinc sulfate  220 mg Oral Daily     LOS: 3 days   Cherene Altes, MD [facility] Office  [phone] Pager - Text Page per Amion  If 7PM-7AM, please contact night-coverage per Amion 09/07/2020, 9:54 AM

## 2020-09-07 NOTE — Progress Notes (Signed)
Jerry Antigua, MD [address] [place], [place], [place], [address] [address], [place], [place], [place], [address] Phone: [phone]  Fax: [phone]   Subjective:  Flowsheets document brown stools.  Patient completed his colonoscopy prep.  Hemoglobin has been stable.  Objective: Exam: Vital signs in last 24 hours: Vitals:   09/07/20 0048 09/07/20 0507 09/07/20 0751 09/07/20 0817  BP: (!) 170/71 (!) 155/71 (!) 172/77   Pulse: (!) 55 (!) 58 (!) 57   Resp: 16 16 15    Temp: 97.6 F (36.4 C) (!) 97.5 F (36.4 C)  97.8 F (36.6 C)  TempSrc: Oral Oral  Oral  SpO2: 97% 100% 98%   Weight:      Height:       Weight change:  No intake or output data in the 24 hours ending 09/07/20 1038  General: No acute distress, AAO x3 Abd: Soft, NT/ND, No HSM Skin: Warm, no rashes Neck: Supple, Trachea midline   Lab Results: Lab Results  Component Value Date   WBC 4.1 09/07/2020   HGB 9.3 (L) 09/07/2020   HCT 30.2 (L) 09/07/2020   MCV 78.9 (L) 09/07/2020   PLT 168 09/07/2020   Micro Results: Recent Results (from the past 240 hour(s))  SARS CORONAVIRUS 2 (TAT 6-24 HRS) Nasopharyngeal Nasopharyngeal Swab     Status: Abnormal   Collection Time: 09/02/20 10:05 PM   Specimen: Nasopharyngeal Swab  Result Value Ref Range Status   SARS Coronavirus 2 POSITIVE (A) NEGATIVE Final    Comment: (NOTE) SARS-CoV-2 target nucleic acids are DETECTED.  The SARS-CoV-2 RNA is generally detectable in upper and lower respiratory specimens during the acute phase of infection. Positive results are indicative of the presence of SARS-CoV-2 RNA. Clinical correlation with patient history and other diagnostic information is  necessary to determine patient infection status. Positive results do not rule out bacterial infection or co-infection with other viruses.  The expected result is Negative.  Fact Sheet for Patients: SugarRoll.be  Fact Sheet for  Healthcare Providers: https://www.woods-mathews.com/  This test is not yet approved or cleared by the [place] FDA and  has been authorized for detection and/or diagnosis of SARS-CoV-2 by FDA under an Emergency Use Authorization (EUA). This EUA will remain  in effect (meaning this test can be used) for the duration of the COVID-19 declaration under Section 564(b)(1) of the Act, 21 U. S.C. section 360bbb-3(b)(1), unless the authorization is terminated or revoked sooner.   Performed at [facility], [place] [address], [place], [place] [address]    Studies/Results: No results found. Medications:  Scheduled Meds: . amLODipine  10 mg Oral Daily  . cholecalciferol  1,000 Units Oral Daily  . insulin aspart  0-9 Units Subcutaneous TID WC  . insulin glargine  10 Units Subcutaneous QHS  . irbesartan  300 mg Oral Daily  . nebivolol  10 mg Oral Daily  . pantoprazole (PROTONIX) IV  40 mg Intravenous BID  . rosuvastatin  40 mg Oral q1800  . vitamin B-12  1,000 mcg Oral Daily  . zinc sulfate  220 mg Oral Daily   Continuous Infusions: PRN Meds:.acetaminophen **OR** acetaminophen, ondansetron **OR** ondansetron (ZOFRAN) IV   Assessment: Principal Problem:   Acute on chronic blood loss anemia Active Problems:   Insulin dependent type 2 diabetes mellitus (HCC)   Essential hypertension   S/P CABG x 3   CAD (coronary artery disease), native coronary artery   S/P drug eluting coronary stent placement   Hyperlipidemia associated  with type 2 diabetes mellitus ([place])   Acute blood loss anemia   COVID-19 virus infection   Anemia due to vitamin B12 deficiency    Plan: Proceed with EGD and colonoscopy today for evaluation of iron deficiency anemia  I have discussed alternative options, risks & benefits,  which include, but are not limited to, bleeding, infection, perforation,respiratory complication & drug reaction.  The patient agrees with this plan & written consent will  be obtained.    Patient has received iron infusion due to low ferritin at 10  Continue iron infusions as necessary  Continue monitoring CBC and transfuse as needed    LOS: 3 days   Jerry Antigua, MD 09/07/2020, 10:38 AM

## 2020-09-08 ENCOUNTER — Encounter: Payer: Self-pay | Admitting: Gastroenterology

## 2020-09-08 LAB — COMPREHENSIVE METABOLIC PANEL
ALT: 20 U/L (ref 0–44)
AST: 24 U/L (ref 15–41)
Albumin: 3.7 g/dL (ref 3.5–5.0)
Alkaline Phosphatase: 88 U/L (ref 38–126)
Anion gap: 9 (ref 5–15)
BUN: 12 mg/dL (ref 8–23)
CO2: 26 mmol/L (ref 22–32)
Calcium: 9.8 mg/dL (ref 8.9–10.3)
Chloride: 104 mmol/L (ref 98–111)
Creatinine, Ser: 1.19 mg/dL (ref 0.61–1.24)
GFR, Estimated: >60 mL/min (ref >60)
Glucose, Bld: 214 mg/dL — ABNORMAL HIGH (ref 70–99)
Potassium: 3.5 mmol/L (ref 3.5–5.1)
Sodium: 139 mmol/L (ref 135–145)
Total Bilirubin: 0.4 mg/dL (ref 0.3–1.2)
Total Protein: 6.4 g/dL — ABNORMAL LOW (ref 6.5–8.1)

## 2020-09-08 LAB — CBC
HCT: 31.9 % — ABNORMAL LOW (ref 39.0–52.0)
Hemoglobin: 9.8 g/dL — ABNORMAL LOW (ref 13.0–17.0)
MCH: 24.6 pg — ABNORMAL LOW (ref 26.0–34.0)
MCHC: 30.7 g/dL (ref 30.0–36.0)
MCV: 80.2 fL (ref 80.0–100.0)
Platelets: 176 10*3/uL (ref 150–400)
RBC: 3.98 MIL/uL — ABNORMAL LOW (ref 4.22–5.81)
RDW: 19.3 % — ABNORMAL HIGH (ref 11.5–15.5)
WBC: 5.5 10*3/uL (ref 4.0–10.5)
nRBC: 0 % (ref 0.0–0.2)

## 2020-09-08 LAB — GLUCOSE, CAPILLARY
Glucose-Capillary: 100 mg/dL — ABNORMAL HIGH (ref 70–99)
Glucose-Capillary: 198 mg/dL — ABNORMAL HIGH (ref 70–99)
Glucose-Capillary: 137 mg/dL — ABNORMAL HIGH (ref 70–99)

## 2020-09-08 LAB — SURGICAL PATHOLOGY

## 2020-09-08 MED ORDER — IRBESARTAN 300 MG PO TABS: 300.0000 mg | ORAL_TABLET | Freq: Every day | ORAL | 1 refills | Status: DC

## 2020-09-08 MED ORDER — PANTOPRAZOLE SODIUM 40 MG PO TBEC
40.0000 mg | DELAYED_RELEASE_TABLET | Freq: Two times a day (BID) | ORAL | Status: DC
  Administered 2020-09-08: 40 mg via ORAL
  Filled 2020-09-08: qty 1

## 2020-09-08 MED ORDER — ACETAMINOPHEN 325 MG PO TABS: 650.0000 mg | ORAL_TABLET | Freq: Four times a day (QID) | ORAL | Status: DC | PRN

## 2020-09-08 MED ORDER — HYDRALAZINE HCL 10 MG PO TABS
10.0000 mg | ORAL_TABLET | Freq: Three times a day (TID) | ORAL | Status: DC
  Administered 2020-09-08: 10 mg via ORAL
  Filled 2020-09-08 (×2): qty 1

## 2020-09-08 MED ORDER — HYDRALAZINE HCL 10 MG PO TABS: 10.0000 mg | ORAL_TABLET | Freq: Three times a day (TID) | ORAL | 1 refills | Status: DC

## 2020-09-08 MED ORDER — VITAMIN B-12 1000 MCG PO TABS: 1000.0000 ug | ORAL_TABLET | Freq: Every day | ORAL | 0 refills | Status: DC

## 2020-09-08 MED ORDER — OMEPRAZOLE 40 MG PO CPDR: 40.0000 mg | DELAYED_RELEASE_CAPSULE | Freq: Two times a day (BID) | ORAL | 0 refills | Status: DC

## 2020-09-08 NOTE — Plan of Care (Signed)

## 2020-09-08 NOTE — Discharge Instructions (Signed)
Date of Positive COVID Test: 09/02/20  Date Quarantine Ends: 09/07/20   Gastrointestinal Bleeding Gastrointestinal (GI) bleeding is bleeding somewhere along the path that food travels through the body (digestive tract). This path is anywhere between the mouth and the opening of the butt (anus). You may have blood in your poop (stool) or have black poop. If you throw up (vomit), there may be blood in it. This condition can be mild, serious, or even life-threatening. If you have a lot of bleeding, you may need to stay in the hospital. What are the causes? This condition may be caused by:  Irritation and swelling of the esophagus (esophagitis). The esophagus is part of the body that moves food from your mouth to your stomach.  Swollen veins in the butt (hemorrhoids).  Areas of painful tearing in the opening of the butt (anal fissures). These are often caused by passing hard poop.  Pouches that form on the colon over time (diverticulosis).  Irritation and swelling (diverticulitis) in areas where pouches have formed on the colon.  Growths (polyps) or cancer. Colon cancer often starts out as growths that are not cancer.  Irritation of the stomach lining (gastritis).  Sores (ulcers) in the stomach. What increases the risk? You are more likely to develop this condition if you:  Have a certain type of infection in your stomach (Helicobacter pylori infection).  Take certain medicines.  Smoke.  Drink alcohol. What are the signs or symptoms? Common symptoms of this condition include:  Throwing up (vomiting) material that has bright red blood in it. It may look like coffee grounds.  Changes in your poop. The poop may: ? Have red blood in it. ? Be black, look like tar, and smell stronger than normal. ? Be red.  Pain or cramping in the belly (abdomen). How is this treated? Treatment for this condition depends on the cause of the bleeding. For example:  Sometimes, the bleeding can  be stopped during a procedure that is done to find the problem (endoscopy or colonoscopy).  Medicines can be used to: ? Help control irritation, swelling, or infection. ? Reduce acid in your stomach.  Certain problems can be treated with: ? Creams. ? Medicines that are put in the butt (suppositories). ? Warm baths.  Surgery is sometimes needed.  If you lose a lot of blood, you may need a blood transfusion. If bleeding is mild, you may be allowed to go home. If there is a lot of bleeding, you will need to stay in the hospital. Follow these instructions at home:  Take over-the-counter and prescription medicines only as told by your doctor.  Eat foods that have a lot of fiber in them. These foods include beans, whole grains, and fresh fruits and vegetables. You can also try eating 1-3 prunes each day.  Drink enough fluid to keep your pee (urine) pale yellow.  Keep all follow-up visits as told by your doctor. This is important.   Contact a doctor if:  Your symptoms do not get better. Get help right away if:  Your bleeding does not stop.  You feel dizzy or you pass out (faint).  You feel weak.  You have very bad cramps in your back or belly.  You pass large clumps of blood (clots) in your poop.  Your symptoms are getting worse.  You have chest pain or fast heartbeats. Summary  GI bleeding is bleeding somewhere along the path that food travels through the body (digestive tract).  This bleeding can  be caused by many things. Treatment depends on the cause of the bleeding.  Take medicines only as told by your doctor.  Keep all follow-up visits as told by your doctor. This is important. This information is not intended to replace advice given to you by your health care provider. Make sure you discuss any questions you have with your health care provider. Document Revised: 03/12/2018 Document Reviewed: 03/12/2018 Elsevier Patient Education  2021 [employer].   Vitamin  B12 Deficiency Vitamin B12 deficiency occurs when the body does not have enough vitamin B12, which is an important vitamin. The body needs this vitamin:  To make red blood cells.  To make DNA. This is the genetic material inside cells.  To help the nerves work properly so they can carry messages from the brain to the body. Vitamin B12 deficiency can cause various health problems, such as a low red blood cell count (anemia) or nerve damage. What are the causes? This condition may be caused by:  Not eating enough foods that contain vitamin B12.  Not having enough stomach acid and digestive fluids to properly absorb vitamin B12 from the food that you eat.  Certain digestive system diseases that make it hard to absorb vitamin B12. These diseases include Crohn's disease, chronic pancreatitis, and cystic fibrosis.  A condition in which the body does not make enough of a protein (intrinsic factor), resulting in too few red blood cells (pernicious anemia).  Having a surgery in which part of the stomach or small intestine is removed.  Taking certain medicines that make it hard for the body to absorb vitamin B12. These medicines include: ? Heartburn medicines (antacids and proton pump inhibitors). ? Certain antibiotic medicines. ? Some medicines that are used to treat diabetes, tuberculosis, gout, or high cholesterol. What increases the risk? The following factors may make you more likely to develop a B12 deficiency:  Being older than age 75.  Eating a vegetarian or vegan diet, especially while you are pregnant.  Eating a poor diet while you are pregnant.  Taking certain medicines.  Having alcoholism. What are the signs or symptoms? In some cases, there are no symptoms of this condition. If the condition leads to anemia or nerve damage, various symptoms can occur, such as:  Weakness.  Fatigue.  Loss of appetite.  Weight loss.  Numbness or tingling in your hands and  feet.  Redness and burning of the tongue.  Confusion or memory problems.  Depression.  Sensory problems, such as color blindness, ringing in the ears, or loss of taste.  Diarrhea or constipation.  Trouble walking. If anemia is severe, symptoms can include:  Shortness of breath.  Dizziness.  Rapid heart rate (tachycardia). How is this diagnosed? This condition may be diagnosed with a blood test to measure the level of vitamin B12 in your blood. You may also have other tests, including:  A group of tests that measure certain characteristics of blood cells (complete blood count, CBC).  A blood test to measure intrinsic factor.  A procedure where a thin tube with a camera on the end is used to look into your stomach or intestines (endoscopy). Other tests may be needed to discover the cause of B12 deficiency. How is this treated? Treatment for this condition depends on the cause. This condition may be treated by:  Changing your eating and drinking habits, such as: ? Eating more foods that contain vitamin B12. ? Drinking less alcohol or no alcohol.  Getting vitamin B12 injections.  Taking vitamin B12 supplements. Your health care provider will tell you which dosage is best for you. Follow these instructions at home: Eating and drinking  Eat lots of healthy foods that contain vitamin B12, including: ? Meats and poultry. This includes beef, pork, chicken, [place], and organ meats, such as liver. ? Seafood. This includes clams, rainbow trout, salmon, tuna, and haddock. ? Eggs. ? Cereal and dairy products that are fortified. This means that vitamin B12 has been added to the food. Check the label on the package to see if the food is fortified. The items listed above may not be a complete list of recommended foods and beverages. Contact a dietitian for more information.   General instructions  Get any injections that are prescribed by your health care provider.  Take  supplements only as told by your health care provider. Follow the directions carefully.  Do not drink alcohol if your health care provider tells you not to. In some cases, you may only be asked to limit alcohol use.  Keep all follow-up visits as told by your health care provider. This is important. Contact a health care provider if:  Your symptoms come back. Get help right away if you:  Develop shortness of breath.  Have a rapid heart rate.  Have chest pain.  Become dizzy or lose consciousness. Summary  Vitamin B12 deficiency occurs when the body does not have enough vitamin B12.  The main causes of vitamin B12 deficiency include dietary deficiency, digestive diseases, pernicious anemia, and having a surgery in which part of the stomach or small intestine is removed.  In some cases, there are no symptoms of this condition. If the condition leads to anemia or nerve damage, various symptoms can occur, such as weakness, shortness of breath, and numbness.  Treatment may include getting vitamin B12 injections or taking vitamin B12 supplements. Eat lots of healthy foods that contain vitamin B12. This information is not intended to replace advice given to you by your health care provider. Make sure you discuss any questions you have with your health care provider. Document Revised: 01/16/2019 Document Reviewed: 04/08/2018 Elsevier Patient Education  2021 [employer].

## 2020-09-08 NOTE — Discharge Summary (Signed)
DISCHARGE SUMMARY  [name]  MR#: [id number]  DOB:Feb 17, 1946  Date of Admission: 09/02/2020 Date of Discharge: 09/08/2020  Attending Physician:Connor Foxworthy Hennie Duos, MD  Patient's WSF:KCLE, Leona Carry, MD  Consults: GI Cards  Disposition: D/C home   Date of Positive COVID Test: 09/02/20  Date Quarantine Ends: 09/07/20  COVID-19 specific Treatment: Remdesivir x3 days   Follow-up Appts:  Follow-up Information    Yolonda Kida, MD On 09/14/2020.   Specialties: Cardiology, Internal Medicine Why: @ 10:00 am Contact information: [address] [place] [address] [phone]        Virgel Manifold, MD. Go on 10/26/2020.   Specialty: Gastroenterology Why: (Office will put Patient on Cancellation Waiting List) @ 2:15 pm Contact information: [place] [address] [phone]        Albina Billet, MD In 1 week.   Specialty: Internal Medicine Why: Patient to call office for Appt.  Contact information: [address] [place] [address] [phone]               Tests Needing Follow-up: -CBC is suggested in 7 days  -Recheck of B12 level suggested in 3 weeks to determine if scheduled B12 injections needed  -Outpatient thyroid ultrasound in next 3 months suggested to follow-up incidentally noted right thyroid nodule -Results of multiple biopsies taken via endoscopy to be followed up by GI -Needs repeat outpt colo in short course due to incomplete prep  Discharge Diagnoses: Symptomatic anemia of acute blood loss GI bleed Bleeding Gastric tumor - lesser curvature  4 mm sessile polyp gastric antrum 5 mm sessile polyp incisura Multiple 4 to 5 mm sessile polyps in the gastric fundus 6 mm polyp in the sigmoid colon 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 He was initially seen in the ED 1/9 at which time a Covid test was negative and his hemoglobin was noted to be 8.7. He was subsequently evaluated in the [facility] ED 1/20 where he was noted to have asymmetric edema with venous duplex unremarkable for DVT. CTA chest was reportedly negative for PE and CXR was clear. Hemoglobin was found to be 9.8. He again was Covid negative. He had follow-up labs obtained 1/21 at which time he was found to have a hemoglobin of 7.8 and was therefore advised to present to the ED. In the ED 09/02/20 he was incidentally found to be CoViD+.   Hospital Course:  Symptomatic anemia of acute blood loss - GI bleed Baseline hemoglobin approximately 9.8 - presented with hemoglobin 7.8 -  guaiac positive dark stool -Plavix fully discontinued per Cards recs - ASA on hold until full outpt GI eval completed - continue PPI - GI completed endoscopic evaluation 1/26 - Hgb stable at 9.8 at time of d/c   Bleeding Gastric tumor - lesser curvature  A large, polypoid and ulcerated, non-circumferential mass with oozing bleeding was found on the lesser curvature of the stomach via EGD 1/26 - biopsies pending at time of d/c - pt aware further treatment options will need to be  discussed once the bx results are available - f/u w/ GI as outpt   4 mm sessile polyp gastric antrum Noted on EGD 1/26 - biopsy results pending at d/c   5 mm sessile polyp incisura Noted on EGD 1/26 - biopsy pending at d/c   Multiple 4 to 5 mm sessile polyps in the gastric fundus Noted on EGD 1/26 - biopsy pending at d/c   6 mm polyp in the sigmoid colon Noted on colo 1/26 - biopsy pending at d/c - needs repeat outpt colo in short course due to incomplete  prep  Large hypertrophied anal papillae Noted on colo 1/26 - GI to refer pt to Gen Surgery as outpt   CAD status post CABG and PCI with DES to RCA October 2021 Cardiology consulted - okay to hold aspirin for now, with plan to resume as soon as possible pending completion of GI w/u - Plavix discontinued   Asymptomatic CoViD positive incidentally noted and essentially asymptomatic at this time - met criteria for 3-day course of Remdesivir which was provided - clear to discontinue isolation after 5 days - is fully vaccinated and boosted   Mild Hypercalcemia noted in labs October 2021 -will need outpatient follow-up - Ca normal at time of d/c   Mild hypokalemia Likely simply due to poor intake -corrected with supplementation  B12 deficiency  B12 found to be 141 during this hospital stay - supplemented w/ SQ load x2 during his admission with transition to oral replacement at time of discharge -will need outpatient follow-up in 3-4 weeks as it is likely he is not absorbing it and may require scheduled B12 injections  HTN continue usual home medical therapy  HLD continue usual home medical therapy  R thyroid nodule incidentally noted on CTA chest September 01, 2020 -suggest outpatient dedicated thyroid ultrasound -TSH normal   Allergies as of 09/08/2020   No Known Allergies     Medication List    STOP taking these medications   aspirin EC 81 MG tablet   chlorthalidone 25 MG tablet Commonly known as: HYGROTON   clopidogrel 75 MG tablet Commonly known as: PLAVIX     TAKE these medications   acetaminophen 325 MG tablet Commonly known as: TYLENOL Take 2 tablets (650 mg total) by mouth every 6 (six) hours as needed for mild pain (or Fever >/= 101).   amLODipine 10 MG tablet Commonly known as: NORVASC Take 10 mg by mouth daily.   hydrALAZINE 10 MG tablet Commonly known as: APRESOLINE Take 1 tablet (10 mg total) by mouth 3 (three) times daily.   insulin glargine 100  UNIT/ML injection Commonly known as: LANTUS Inject 25 Units into the skin at bedtime.   irbesartan 300 MG tablet Commonly known as: AVAPRO Take 1 tablet (300 mg total) by mouth daily. Start taking on: September 09, 2020   metFORMIN 500 MG 24 hr tablet Commonly known as: GLUCOPHAGE-XR Take 500 mg by mouth 2 (two) times daily.   nebivolol 10 MG tablet Commonly known as: BYSTOLIC Take 10 mg by mouth daily.   omeprazole 40 MG capsule Commonly known as: PRILOSEC Take 1 capsule (40 mg total) by mouth 2 (two) times daily with a meal. What changed: when to take this   rosuvastatin 40 MG tablet Commonly known as: CRESTOR Take 40 mg by mouth daily.   sitaGLIPtin 100 MG tablet Commonly known as: JANUVIA Take 100 mg by mouth daily.   Testosterone 1.62 % Gel APPLY 2 PUMPS TO THE SKIN DAILY  What changed: See the new instructions.   triamcinolone 0.025 % cream Commonly known as: KENALOG Apply 1 application topically daily.   vitamin B-12 1000 MCG tablet Commonly known as: CYANOCOBALAMIN Take 1 tablet (1,000 mcg total) by mouth daily.       Day of Discharge BP (!) 164/67 (BP Location: Right Arm)   Pulse 60   Temp 97.7 F (36.5 C) (Oral)   Resp 18   Ht '6\' 1"'  (1.854 m)   Wt 117.9 kg   SpO2 100%   BMI 34.30 kg/m   Physical Exam: General: No acute respiratory distress Lungs: Clear to auscultation bilaterally without wheezes or crackles Cardiovascular: Regular rate and rhythm without murmur gallop or rub normal S1 and S2 Abdomen: Nontender, nondistended, soft, bowel sounds positive, no rebound, no ascites, no appreciable mass Extremities: No significant cyanosis, clubbing, or edema bilateral lower extremities  Basic Metabolic Panel: Recent Labs  Lab 09/02/20 1534 09/03/20 0330 09/03/20 0857 09/04/20 0414 09/05/20 0412 09/08/20 0650  NA 145 143  --  144 143 139  K 4.3 3.5  --  3.6 3.4* 3.5  CL 107 107  --  107 106 104  CO2 23 26  --  '26 26 26  ' GLUCOSE 104* 126*   --  176* 135* 214*  BUN 36* 33*  --  28* 16 12  CREATININE 1.38* 1.38*  --  1.38* 1.24 1.19  CALCIUM 11.1* 10.9* 10.8* 10.2 10.2 9.8    Liver Function Tests: Recent Labs  Lab 09/02/20 1534 09/08/20 0650  AST 23 24  ALT 18 20  ALKPHOS 69 88  BILITOT 0.7 0.4  PROT 7.0 6.4*  ALBUMIN 4.0 3.7   CBC: Recent Labs  Lab 09/02/20 1534 09/02/20 1932 09/04/20 0414 09/05/20 0412 09/06/20 0716 09/07/20 0745 09/08/20 0650  WBC 8.9   < > 6.7 4.3 4.9 4.1 5.5  NEUTROABS 7.1  --   --   --   --   --   --   HGB 8.2*   < > 8.1* 7.6* 8.7* 9.3* 9.8*  HCT 27.8*   < > 26.9* 25.7* 28.3* 30.2* 31.9*  MCV 76.2*   < > 77.3* 76.9* 78.4* 78.9* 80.2  PLT 226   < > 153 159 159 168 176   < > = values in this interval not displayed.    BNP (last 3 results) Recent Labs    05/24/20 1533  BNP 142.4*    CBG: Recent Labs  Lab 09/07/20 1323 09/07/20 1658 09/07/20 2012 09/08/20 0815 09/08/20 1210  GLUCAP 128* 193* 100* 198* 137*    Recent Results (from the past 240 hour(s))  SARS CORONAVIRUS 2 (TAT 6-24 HRS) Nasopharyngeal Nasopharyngeal Swab     Status: Abnormal   Collection Time: 09/02/20 10:05 PM   Specimen: Nasopharyngeal Swab  Result Value Ref Range Status   SARS Coronavirus 2 POSITIVE (A) NEGATIVE Final    Comment: (NOTE) SARS-CoV-2 target nucleic acids are DETECTED.  The SARS-CoV-2 RNA is generally detectable in upper and lower respiratory specimens during the acute phase of infection. Positive results are indicative of the presence of SARS-CoV-2 RNA. Clinical correlation with patient history and other diagnostic information is  necessary to determine patient infection status. Positive results do not rule out bacterial infection or co-infection with other viruses.  The expected result is Negative.  Fact Sheet for Patients: SugarRoll.be  Fact Sheet for Healthcare Providers: https://www.woods-mathews.com/  This test is not yet approved  or cleared by the [place] FDA and  has been authorized for detection and/or diagnosis of SARS-CoV-2 by FDA under an Emergency Use Authorization (EUA). This EUA will remain  in effect (meaning this test can be used) for the duration of the COVID-19 declaration under Section 564(b)(1) of the Act, 21 U. S.C. section 360bbb-3(b)(1), unless the authorization is terminated or revoked sooner.   Performed at [facility], [place] [address], [place], [place] [address]      Time spent in discharge (includes decision making & examination of pt): 35 minutes  09/08/2020, 3:28 PM   Cherene Altes, MD [facility] Office  [phone]

## 2020-09-08 NOTE — Progress Notes (Signed)
DISCHARGE WITH D/C INSTRUCTIONS AND F/U APPOINTMENTS , IV REMOVED FROM [facility]. PATIENT TRANSFERRED VIA W/C TO MEDICAL MALL, SON PICKED HIM UP WITHOUT INCIDENT

## 2020-09-08 NOTE — Progress Notes (Signed)
Final [facility] Encounter Note  Patient: [name] / Admit Date: 09/02/2020 / Date of Encounter: 09/08/2020, 8:58 AM   Subjective: 1/23.  Patient is overall doing much better than yesterday.  Denies shortness of breath weakness or fatigue.  No evidence of chest discomfort.  Patient has not not had any evidence of active bleeding at this time.  Patient had discontinuation of Plavix and aspirin but will potentially reinstate aspirin if able.  Patient now has COVID positive although no evidence of symptoms.  Hemoglobin increased and improved to 8.1.  Patient is continuing to be at low risk for cardiovascular complication with colonoscopy and endoscopy  1/25.  Patient feeling much better since admission with stabilized hemoglobin and no evidence of active bleeding at this time.  Patient has tolerated being off of his antiplatelet medication management.  Blood pressure is significantly elevated today and has been maxed out on multiple medications including beta-blocker angiotensin receptor blocker and amlodipine.  Will consider hydralazine if necessary moving forward.  Patient remains at low risk for cardiovascular complication with colonoscopy and endoscopy to assess for primary cause of bleeding complication.  1/27. Patient has felt quite well from the cardiovascular standpoint with no evidence of chest pain shortness of breath PND orthopnea syncope dizziness nausea or diaphoresis. The patient has done well with abstinence of Plavix and aspirin and has had no further episodes of bleeding. Blood pressure is still significantly elevated at this time despite appropriate medication management including maximal dose of Bystolic, irbesartan, amlodipine. Would have some consideration of addition of hydralazine to help before patient discharge. Review of Systems: Positive for: None Negative for: Vision change, hearing change, syncope, dizziness, nausea, vomiting,diarrhea, bloody  stool, stomach pain, cough, congestion, diaphoresis, urinary frequency, urinary pain,skin lesions, skin rashes Others previously listed  Objective: Telemetry: Normal sinus rhythm Physical Exam: Blood pressure (!) 180/82, pulse 62, temperature 97.8 F (36.6 C), temperature source Oral, resp. rate (!) 22, height 6\' 1"  (1.854 m), weight 117.9 kg, SpO2 99 %. Body mass index is 34.3 kg/m. General: Well developed, well nourished, in no acute distress. Head: Normocephalic, atraumatic, sclera non-icteric, no xanthomas, nares are without discharge. Neck: No apparent masses Lungs: Normal respirations with no wheezes, no rhonchi, no rales , no crackles   Heart: Regular rate and rhythm, normal S1 S2, no murmur, no rub, no gallop, PMI is normal size and placement, carotid upstroke normal without bruit, jugular venous pressure normal Abdomen: Soft, non-tender, non-distended with normoactive bowel sounds. No hepatosplenomegaly. Abdominal aorta is normal size without bruit Extremities: No edema, no clubbing, no cyanosis, no ulcers,  Peripheral: 2+ radial, 2+ femoral, 2+ dorsal pedal pulses Neuro: Alert and oriented. Moves all extremities spontaneously. Psych:  Responds to questions appropriately with a normal affect.   Intake/Output Summary (Last 24 hours) at 09/08/2020 0858 Last data filed at 09/07/2020 1217 Gross per 24 hour  Intake 100 ml  Output -  Net 100 ml    Inpatient Medications:  . amLODipine  10 mg Oral Daily  . cholecalciferol  1,000 Units Oral Daily  . cyanocobalamin  1,000 mcg Subcutaneous Daily  . insulin aspart  0-9 Units Subcutaneous TID WC  . insulin glargine  10 Units Subcutaneous QHS  . irbesartan  300 mg Oral Daily  . nebivolol  10 mg Oral Daily  . pantoprazole (PROTONIX) IV  40 mg Intravenous BID  . rosuvastatin  40 mg Oral q1800  . zinc sulfate  220 mg Oral Daily   Infusions:  Labs: Recent Labs    09/08/20 0650  NA 139  K 3.5  CL 104  CO2 26  GLUCOSE 214*   BUN 12  CREATININE 1.19  CALCIUM 9.8   Recent Labs    09/08/20 0650  AST 24  ALT 20  ALKPHOS 88  BILITOT 0.4  PROT 6.4*  ALBUMIN 3.7   Recent Labs    09/07/20 0745 09/08/20 0650  WBC 4.1 5.5  HGB 9.3* 9.8*  HCT 30.2* 31.9*  MCV 78.9* 80.2  PLT 168 176   No results for input(s): CKTOTAL, CKMB, TROPONINI in the last 72 hours. Invalid input(s): POCBNP No results for input(s): HGBA1C in the last 72 hours.   Weights: Filed Weights   09/02/20 1531  Weight: 117.9 kg     Radiology/Studies:  DG Chest 2 View  Result Date: 08/21/2020 CLINICAL DATA:  Cough and shortness of breath EXAM: CHEST - 2 VIEW COMPARISON:  02/11/2017 FINDINGS: Previous median sternotomy and CABG. Mild cardiomegaly. Aortic atherosclerosis and tortuosity. No evidence of heart failure or effusion. Mild chronic scarring at the lung bases. IMPRESSION: No active disease. Previous CABG. Mild cardiomegaly. Aortic atherosclerosis. Electronically Signed   By: Nelson Chimes M.D.   On: 08/21/2020 14:06     Assessment and Recommendation  75 y.o. male with known coronary disease status post coronary bypass graft with a recent stent in right coronary artery 10/21 hypertension hyperlipidemia chronic kidney disease stage III with acute bleeding issues of unknown etiology but no further active bleeding and no current evidence of congestive heart failure or myocardial infarction. The patient has been found to have a significant gastric mass possibly needing further intervention. Currently he is at lowest risk possible for cardiovascular complication with surgical intervention and or other treatments. 1.  Continue treatment of anemia and bleeding as necessary if new active bleeding occurs. With endoscopy suggesting still oozing and some mild bleeding the patient will continue to abstain from aspirin and Plavix at this time 2.  No further cardiac diagnostics necessary at this time due to no evidence of congestive heart failure or  angina and/or myocardial infarction 3.  Continue medication management for further risk reduction cardiovascular event including high intensity cholesterol therapy, hypertension control, as well as diabetes control. Addition of hydralazine for better hypertension control while watching as above with adjustments as outpatient 4.  Abstain from Plavix at this time due to patient having no issues since stent placement greater than 3 months ago and potentially reinstatement of aspirin if able as an outpatient after full resolution of bleeding and concerns of gastric mass  5. Patient is still at low risk for cardiovascular complication with further intervention if necessary for gastric mass. Otherwise okay for discharged home as well from the cardiovascular standpoint with further treatment options after above. The patient is scheduled to see Dr. Clayborn Bigness on February 2 for further assessment and treatments 6.  Call if further questions if needed  Signed, Serafina Royals M.D. FACC

## 2020-09-09 ENCOUNTER — Telehealth: Payer: Self-pay

## 2020-09-09 NOTE — Telephone Encounter (Signed)
-----   Message from Irving Copas., MD sent at 09/08/2020  5:12 PM EST ----- VT, Reviewed the EGD.  Happy to be of assistance.  If you think this is the etiology of the iron deficiency we certainly can go ahead and remove it and then monitor his counts.  In interim IV iron likely will be helpful.   Talal Fritchman, schedule this patient a 60-minute EGD EMR in the hospital-based setting next available with me.  I am okay to not see the patient in clinic unless he wants to be seen. Please update Dr. Bonna Gains and myself with what date this turns out to be. He will not necessarily need to be retested because he had asymptomatic Covid during this recent hospitalization. Thanks. GM ----- Message ----- From: Virgel Manifold, MD Sent: 09/08/2020   1:48 PM EST To: Irving Copas., MD  Hi Dr. Jerilynn Mages,  Would you mind taking a look at this patient's chart. He had iron deficiency anemia and underwent upper endoscopy that showed a large ulcerated friable gastric lesion with biopsies only showing hyperplastic polyp. Seeing if you can look into removing this polyp? Thank you

## 2020-09-10 LAB — PTH-RELATED PEPTIDE: PTH-related peptide: <2 pmol/L

## 2020-09-12 NOTE — Telephone Encounter (Signed)
Jerry Manifold, MD  Mansouraty, Telford Nab., MD; Timothy Lasso, RN Doristine Devoid, thank you! I spoke with the pt and he will be expecting a call from your office to set up the procedure. And we are getting him set up with hematology as well

## 2020-09-13 ENCOUNTER — Other Ambulatory Visit: Payer: Self-pay

## 2020-09-13 DIAGNOSIS — K3189 Other diseases of stomach and duodenum: Secondary | ICD-10-CM

## 2020-09-13 DIAGNOSIS — D500 Iron deficiency anemia secondary to blood loss (chronic): Secondary | ICD-10-CM

## 2020-09-13 NOTE — Telephone Encounter (Signed)
Appt made for 09/22/20 at 730 am at [facility] with Dr Rush Landmark.  No COVID test needed.  The pt was called and we discussed all information at length. He has been instructed. All information will be mailed to his home as well.  He states he is off of his blood thinner at this time.  He will see cardiology tomorrow and make sure it is ok to remain off until after the procedure.    FYI Dr Bonna Gains

## 2020-09-14 NOTE — Telephone Encounter (Signed)
Thanks for update. GM 

## 2020-09-15 ENCOUNTER — Telehealth: Payer: Self-pay

## 2020-09-15 ENCOUNTER — Other Ambulatory Visit: Payer: Self-pay

## 2020-09-15 DIAGNOSIS — D500 Iron deficiency anemia secondary to blood loss (chronic): Secondary | ICD-10-CM

## 2020-09-15 NOTE — Telephone Encounter (Signed)
Referral sent to Oncology for iron deficiency anemia and IV iron infusion.

## 2020-09-15 NOTE — Telephone Encounter (Signed)
-----   Message from Virgel Manifold, MD sent at 09/12/2020  9:50 AM EST ----- Herb Grays, please refer to Dr. Janese Banks for iron deficiency anemia  I called the patient and notified him of the results.  I communicated with Dr. Rush Landmark and patient will be set up for EGD/EMR of hyperplastic polyp as it is the likely cause of his iron deficiency anemia.  Patient has follow-up with me in clinic already scheduled and agrees to keep this appointment for further management as well.  Patient agreeable to hematology referral for IV iron as well

## 2020-09-19 ENCOUNTER — Encounter (HOSPITAL_COMMUNITY): Payer: Self-pay | Admitting: Gastroenterology

## 2020-09-19 ENCOUNTER — Other Ambulatory Visit: Payer: Self-pay

## 2020-09-19 NOTE — Progress Notes (Signed)
PCP - Dr Benita Stabile Cardiologist - Dr Lujean Amel Urology - Zara Council, PA-C  Chest x-ray - 08/21/20 (2V); CT Chest 09/01/20 CE EKG - 09/03/20 Stress Test - 01/21/20 CE ECHO - 01/21/20 CE Cardiac Cath - 06/09/20  Sleep Study -  Yes CPAP - Does not use cpap  Fasting Blood Sugar - 115-190s Checks Blood Sugar 1 times a day  . Do not take oral diabetes medicines (metformin and januvia) the morning of surgery.  . THE NIGHT BEFORE SURGERY, take 12 Units of Lantus Insulin.      . If your blood sugar is less than 70 mg/dL, you will need to treat for low blood sugar: o Treat a low blood sugar (less than 70 mg/dL) with  cup of clear juice (cranberry or apple), 4 glucose tablets, OR glucose gel. o Recheck blood sugar in 15 minutes after treatment (to make sure it is greater than 70 mg/dL). If your blood sugar is not greater than 70 mg/dL on recheck, call [phone] for further instructions.  Anesthesia review: Yes  STOP now taking any Aspirin (unless otherwise instructed by your surgeon), Aleve, Naproxen, Ibuprofen, Motrin, Advil, Goody's, BC's, all herbal medications, fish oil, and all vitamins.   Coronavirus Screening Covid test on 09/02/20 was positive.   Do you have any of the following symptoms:  Cough yes/no: No Fever (>100.5F)  yes/no: No Runny nose yes/no: No Sore throat yes/no: No Difficulty breathing/shortness of breath  yes/no: No  Have you traveled in the last 14 days and where? yes/no: No  Patient verbalized understanding of instructions that were given via phone.

## 2020-09-20 NOTE — Progress Notes (Signed)
Anesthesia Chart Review: [name]   Case: [id number] Date/Time: 09/22/20 0730   Procedures:      ENDOSCOPIC MUCOSAL RESECTION (N/A )     ESOPHAGOGASTRODUODENOSCOPY (EGD) WITH PROPOFOL (N/A )   Anesthesia type: Monitor Anesthesia Care   Pre-op diagnosis: gastric lesion, IDA   Location: [facility] ROOM 1 / [place] [facility]   Surgeons: Mansouraty, Telford Nab., MD      DISCUSSION: Patient is a 75 year old male scheduled for the above procedure. Recent [facility] admission for symptomatic anemia/GI bleed. He was found to have a large, polypoid and ulcerated mass with oozing blood on the lesser curvature of the stomach. Repeat out-patient colonoscopy recommended for incomplete prep. Also with hopes to resect hyperplastic polyp as thought to be the likely cause of his IDA.   History includes never smoker, CAD (s/p CABG: LIMA-LAD, SVG-PDA, SVG-OM3 08/30/16 @ [place]; DES SVG-PDA 06/09/20), DM2, CKD, HTN, dyspnea, GERD, anemia, OSA (does not use CPAP), sessile polyp (s/p right colectomy 08/12/12). + COVID-19 09/02/20.  - Admission [facility] 09/02/20-09/08/20 for GI bleed with HGB 7.8, guaiac positive. Also found to be COVID +. Boosted by notes. S/p remdesivir x 3 days. (Of note, had been seen 09/01/20 at [facility] for SOB and asymmetric edema and had had a negative COVID test at that time. LLE venous Duplex was negative for DVT and CTA negative for PE.). Cardiology and GI consulted. Plavix discontinued and ASA held until full GI work-up completed. EGD and colonoscopy done 09/07/20. He was found to have a large, polypoid and ulcerated mass with oozing blood on the lesser curvature of the stomach. Biopsies were negative for malignancy. Colonoscopy prep was incomplete, so he was referred for out-patient repeat colonoscopy as well as to general sugery due to hypertrophied anal papillae. Outpatient thyroid US recommended for finding of right thyroid nodule on 09/01/20 chest CTA.   Last visit with cardiologist Dr. Clayborn Bigness was on  09/14/20. ASA and Plavix on hold until bleeding issues resolved. He wrote, "patient appears to be an acceptable risk for upcoming EGD once await GIs okay to restart aspirin and Plavix."   VS: Ht '6\' 1"'  (1.854 m)   Wt 117.9 kg   BMI 34.30 kg/m   BP Readings from Last 3 Encounters:  09/08/20 (!) 164/67  08/21/20 (!) 177/74  06/10/20 (!) 175/88    PROVIDERS: Albina Billet, MD is PCP  Lujean Amel, MD is cardiologist  Vonda Antigua, MD is GI   LABS: Labs as of 09/08/20 include: Lab Results  Component Value Date   WBC 5.5 09/08/2020   HGB 9.8 (L) 09/08/2020   HCT 31.9 (L) 09/08/2020   PLT 176 09/08/2020   GLUCOSE 214 (H) 09/08/2020   ALT 20 09/08/2020   AST 24 09/08/2020   NA 139 09/08/2020   K 3.5 09/08/2020   CL 104 09/08/2020   CREATININE 1.19 09/08/2020   BUN 12 09/08/2020   CO2 26 09/08/2020   TSH 3.235 09/03/2020   HGBA1C 6.6 (H) 09/03/2020    OTHER:  Colonoscopy 09/07/20: - Preparation of the colon was fair. - Non-thrombosed external hemorrhoids found on perianal exam. - One 6 mm polyp in the sigmoid colon, removed with a cold snare. Resected and retrieved. - The examination was otherwise normal. - Anal papilla(e) were hypertrophied. - Non-bleeding internal hemorrhoids. - Due to the prep, this was not a satisfactory exam for colorectal cancer screening, that is evaluation for small or flat lesions or polyps. Patient should follow up in clinic  after discharge to discuss need for future colonoscopy and colorectal cancer screening. Impression: - Repeat colonoscopy at next available appointment (within 3 months), with 2 day prep. Would recommend elective colonoscopy to be done atleast a month after now due to acute COVID-19 infection. - Refer to [place] [facility] as outpatient due to large hypertrophied anal papillae noted. Evaluation if this needs EUA to rule out polyp tissue.  EGD 09/07/20: - Normal esophagus. - Rule out malignancy, gastric tumor on the  lesser curvature of the stomach. Biopsied. - A single gastric polyp. Biopsied. - A single gastric polyp. Biopsied. - Multiple gastric polyps. Biopsied. - Normal examined duodenum.  Pathology 09/07/20: A. STOMACH, ANTRUM; COLD BIOPSY:  - REACTIVE GASTROPATHY.  - NEGATIVE FOR ACTIVE INFLAMMATION AND H PYLORI.  - NEGATIVE FOR INTESTINAL METAPLASIA, DYSPLASIA, AND MALIGNANCY.   B. STOMACH, INCISURA POLYP; COLD BIOPSY:  - HYPERPLASTIC POLYP.  - NEGATIVE FOR DYSPLASIA AND MALIGNANCY.   C. STOMACH MASS; COLD BIOPSY:  - HYPERPLASTIC POLYP, HEMORRHAGIC AND ACUTELY INFLAMED, WITH ULCERATION.  - NEGATIVE FOR DYSPLASIA AND MALIGNANCY.   D. STOMACH POLYP, FUNDUS; COLD BIOPSY:  - REACTIVE FOVEOLAR HYPERPLASIA.  - NEGATIVE FOR DYSPLASIA AND MALIGNANCY.   E. COLON POLYP, SIGMOID; COLD SNARE:  - TUBULAR ADENOMA.  - NEGATIVE FOR HIGH-GRADE DYSPLASIA AND MALIGNANCY.    IMAGES: CTA Chest 09/01/20 [facility]): Impression: -No pulmonary embolism.  -Right thyroid nodule. Recommend further evaluation with dedicated thyroid ultrasound.  -Otherwise, no evidence of malignancy within the chest.  CXR 08/21/20: FINDINGS: Previous median sternotomy and CABG. Mild cardiomegaly. Aortic atherosclerosis and tortuosity. No evidence of heart failure or effusion. Mild chronic scarring at the lung bases. IMPRESSION: No active disease. Previous CABG. Mild cardiomegaly. Aortic atherosclerosis.   EKG: 09/03/20: Normal sinus rhythm Sinus rhythm with first degree AV block Right bundle branch block Left anterior fascicular block Trifascicular block Minimal voltage criteria for LVH, may be normal variant ( R in aVL ) Abnormal ECG When compared with ECG of 21-Aug-2020 11:01, No significant change was found Confirmed by End, Harrell Gave [phone]) on 09/06/2020 10:57:43 PM   CV: LE Venous US 09/01/20 [facility]): Final Interpretation  Right  There is no evidence of obstruction proximal to the inguinal ligament or in  the common femoral vein.  Left  There is no evidence of DVT in the lower extremity. However, portions of this examination were limited- see technologist comments above. There is no evidence of obstruction proximal to the inguinal ligament or in the common femoral vein.     Cardiac cath 06/09/20:  LM lesion is 40% stenosed.  Prox RCA lesion is 99% stenosed.  Mid RCA lesion is 75% stenosed.  Dist RCA lesion is 95% stenosed.  RPDA lesion is 90% stenosed.  SVG and is normal in caliber.  The flow is not reversed.  There is no competitive flow  SVG and is normal in caliber.  The flow is not reversed.  There is no competitive flow  Prox Graft lesion is 100% stenosed.  LIMA and is normal in caliber.  The flow is not reversed.  There is no competitive flow  Origin lesion is 95% stenosed.  Previously placed Prox Cx to Dist Cx stent (unknown type) is widely patent.  Dist Cx lesion is 90% stenosed.  Previously placed Ost LAD to Mid LAD stent (unknown type) is widely patent.  Origin to Prox Graft lesion is 100% stenosed.  A drug-eluting stent was successfully placed using a STENT RESOLUTE ONYX 2.5X18.  Post intervention, there  is a 0% residual stenosis.  Ost Cx lesion is 70% stenosed.  Dist LAD-1 lesion is 50% stenosed.  Previously placed Dist LAD-2 stent (unknown type) is widely patent.  Ost LAD lesion is 75% stenosed.  Preserved left ventricular function ejection fraction of 60%   Conclusion Normal left ventricular function 60% Coronaries Left main large calcified moderate lesion moderate to large calcium Distal LAD large ostial lesion 75% moderate calcification proximal LAD stent widely patent mid LAD stent widely patent distal LAD diffuse 50% Circumflex is large 70% ostial mid stent widely patent distal circumflex 95% TIMI-3 flow RCA medium proximal 90% lesion 90% mid distal 75 Grafts LIMA to LAD incompletely evaluated probably occluded SVG to circumflex  100% occluded SVG to PDA 95% ostial in-stent TIMI-3 flow Intervention Successful PCI and stent to in-stent restenosis ostially with 2.5 x 18 mm resolute Onyx to 20 atm Lesion reduced from 95 down to 0% TIMI-3 flow maintained throughout  Successful PCI and stent ostial SVG in-stent restenosis. Lesion reduced from 95 down to 0%   Past Medical History:  Diagnosis Date  . Anemia   . Arthritis   . Chronic kidney disease    patient not aware of this dx  . Coronary artery disease   . Diabetes mellitus without complication ([place])    type 2  . Dyspnea   . GERD (gastroesophageal reflux disease)   . History of blood transfusion   . Hypertension   . Sleep apnea    does not use cpap    Past Surgical History:  Procedure Laterality Date  . APPENDECTOMY    . CARDIAC CATHETERIZATION Left 08/16/2016   Procedure: Left Heart Cath and Coronary Angiography;  Surgeon: Yolonda Kida, MD;  Location: [place] CV LAB;  Service: Cardiovascular;  Laterality: Left;  . COLON SURGERY     part of colon removed  . COLONOSCOPY N/A 09/07/2020   Procedure: COLONOSCOPY;  Surgeon: Virgel Manifold, MD;  Location: [facility] ENDOSCOPY;  Service: Endoscopy;  Laterality: N/A;  . CORONARY ARTERY BYPASS GRAFT  08/30/2016   triple  . CORONARY STENT INTERVENTION N/A 06/09/2020   Procedure: CORONARY STENT INTERVENTION;  Surgeon: Yolonda Kida, MD;  Location: [place] CV LAB;  Service: Cardiovascular;  Laterality: N/A;  . ESOPHAGOGASTRODUODENOSCOPY N/A 09/07/2020   Procedure: ESOPHAGOGASTRODUODENOSCOPY (EGD);  Surgeon: Virgel Manifold, MD;  Location: [facility] ENDOSCOPY;  Service: Endoscopy;  Laterality: N/A;  . LEFT HEART CATH AND CORONARY ANGIOGRAPHY Left 06/09/2020   Procedure: LEFT HEART CATH AND CORONARY ANGIOGRAPHY;  Surgeon: Yolonda Kida, MD;  Location: [place] CV LAB;  Service: Cardiovascular;  Laterality: Left;  . LEFT HEART CATH AND CORS/GRAFTS ANGIOGRAPHY Left 11/20/2016   Procedure:  Left Heart Cath and Cors/Grafts Angiography;  Surgeon: Yolonda Kida, MD;  Location: [place] CV LAB;  Service: Cardiovascular;  Laterality: Left;    MEDICATIONS: No current facility-administered medications for this encounter.   Marland Kitchen acetaminophen (TYLENOL) 325 MG tablet  . amLODipine (NORVASC) 10 MG tablet  . hydrALAZINE (APRESOLINE) 25 MG tablet  . hydrochlorothiazide (HYDRODIURIL) 12.5 MG tablet  . insulin glargine (LANTUS) 100 UNIT/ML injection  . irbesartan (AVAPRO) 300 MG tablet  . metFORMIN (GLUCOPHAGE-XR) 500 MG 24 hr tablet  . nebivolol (BYSTOLIC) 10 MG tablet  . omeprazole (PRILOSEC) 40 MG capsule  . rosuvastatin (CRESTOR) 40 MG tablet  . sitaGLIPtin (JANUVIA) 100 MG tablet  . Testosterone 1.62 % GEL  . triamcinolone (KENALOG) 0.025 % cream  . vitamin B-12 (CYANOCOBALAMIN) 1000 MCG tablet  .  hydrALAZINE (APRESOLINE) 10 MG tablet     Myra Gianotti, PA-C Surgical Short Stay/Anesthesiology [facility] Phone [phone] [facility] Phone [phone] 09/21/2020 9:40 AM

## 2020-09-21 NOTE — Anesthesia Preprocedure Evaluation (Addendum)
Anesthesia Evaluation  Patient identified by MRN, date of birth, ID band Patient awake    Reviewed: Allergy & Precautions, NPO status , Patient's Chart, lab work & pertinent test results, reviewed documented beta blocker date and time   Airway Mallampati: II  TM Distance: >3 FB Neck ROM: Full    Dental no notable dental hx. (+) Teeth Intact, Dental Advisory Given   Pulmonary sleep apnea (does not use CPAP) ,  09/02/2020 COVID POSITIVE (vaccinated, boosted,remdesivir, and has finished quarantine)   breath sounds clear to auscultation       Cardiovascular hypertension, Pt. on medications and Pt. on home beta blockers + CAD and + Cardiac Stents  Normal cardiovascular exam+ dysrhythmias (1st degree AVB)  Rhythm:Regular Rate:Normal  05/2020 Cath: stents patent, EF 60%  CAD (s/p CABG: LIMA-LAD, SVG-PDA, SVG-OM3 08/30/16 @ [place]; DES SVG-PDA 06/09/20)   Neuro/Psych negative neurological ROS  negative psych ROS   GI/Hepatic GERD  Medicated and Controlled,Known gastric ulcer, previously bleeding   Endo/Other  diabetes, Well Controlled, Type 2, Insulin Dependent, Oral Hypoglycemic AgentsObesity BMI 34 Last a1c 6.6  Renal/GU Renal InsufficiencyRenal diseaseCr 1.19  negative genitourinary   Musculoskeletal  (+) Arthritis ,   Abdominal (+) + obese,   Peds  Hematology  (+) Blood dyscrasia (Hb 9.8), anemia , 09/08/20 H/H 9.8/31.9   Anesthesia Other Findings   Reproductive/Obstetrics negative OB ROS                           Anesthesia Physical Anesthesia Plan  ASA: III  Anesthesia Plan: MAC   Post-op Pain Management:    Induction:   PONV Risk Score and Plan: 1 and Treatment may vary due to age or medical condition  Airway Management Planned: Natural Airway and Nasal Cannula  Additional Equipment: None  Intra-op Plan:   Post-operative Plan:   Informed Consent: I have reviewed the patients  History and Physical, chart, labs and discussed the procedure including the risks, benefits and alternatives for the proposed anesthesia with the patient or authorized representative who has indicated his/her understanding and acceptance.       Plan Discussed with: CRNA  Anesthesia Plan Comments:        Anesthesia Quick Evaluation

## 2020-09-22 ENCOUNTER — Encounter (HOSPITAL_COMMUNITY)
Admission: RE | Disposition: A | Discharge status: Home / Self Care | Payer: Self-pay | Source: Home / Self Care | Attending: Gastroenterology

## 2020-09-22 ENCOUNTER — Other Ambulatory Visit: Payer: Self-pay

## 2020-09-22 ENCOUNTER — Encounter (HOSPITAL_COMMUNITY): Payer: Self-pay | Admitting: Gastroenterology

## 2020-09-22 ENCOUNTER — Ambulatory Visit (HOSPITAL_COMMUNITY): Payer: Medicare HMO | Admitting: Vascular Surgery

## 2020-09-22 ENCOUNTER — Ambulatory Visit (HOSPITAL_COMMUNITY)
Admission: RE | Admit: 2020-09-22 | Discharge: 2020-09-22 | Disposition: A | Discharge status: Home / Self Care | Payer: Medicare HMO | Attending: Gastroenterology | Admitting: Gastroenterology

## 2020-09-22 DIAGNOSIS — K449 Diaphragmatic hernia without obstruction or gangrene: Secondary | ICD-10-CM | POA: Insufficient documentation

## 2020-09-22 DIAGNOSIS — E1122 Type 2 diabetes mellitus with diabetic chronic kidney disease: Secondary | ICD-10-CM | POA: Diagnosis not present

## 2020-09-22 DIAGNOSIS — D509 Iron deficiency anemia, unspecified: Secondary | ICD-10-CM | POA: Diagnosis not present

## 2020-09-22 DIAGNOSIS — Z8616 Personal history of COVID-19: Secondary | ICD-10-CM | POA: Insufficient documentation

## 2020-09-22 DIAGNOSIS — N189 Chronic kidney disease, unspecified: Secondary | ICD-10-CM | POA: Insufficient documentation

## 2020-09-22 DIAGNOSIS — K254 Chronic or unspecified gastric ulcer with hemorrhage: Secondary | ICD-10-CM | POA: Diagnosis not present

## 2020-09-22 DIAGNOSIS — K317 Polyp of stomach and duodenum: Secondary | ICD-10-CM | POA: Diagnosis not present

## 2020-09-22 DIAGNOSIS — K3189 Other diseases of stomach and duodenum: Secondary | ICD-10-CM

## 2020-09-22 DIAGNOSIS — I129 Hypertensive chronic kidney disease with stage 1 through stage 4 chronic kidney disease, or unspecified chronic kidney disease: Secondary | ICD-10-CM | POA: Diagnosis not present

## 2020-09-22 DIAGNOSIS — K921 Melena: Secondary | ICD-10-CM | POA: Diagnosis present

## 2020-09-22 DIAGNOSIS — K222 Esophageal obstruction: Secondary | ICD-10-CM

## 2020-09-22 DIAGNOSIS — D500 Iron deficiency anemia secondary to blood loss (chronic): Secondary | ICD-10-CM

## 2020-09-22 HISTORY — DX: Essential (primary) hypertension: I10

## 2020-09-22 HISTORY — PX: POLYPECTOMY: SHX5525

## 2020-09-22 HISTORY — DX: Dyspnea, unspecified: R06.00

## 2020-09-22 HISTORY — DX: Chronic kidney disease, unspecified: N18.9

## 2020-09-22 HISTORY — PX: HEMOSTASIS CONTROL: SHX6838

## 2020-09-22 HISTORY — PX: HEMOSTASIS CLIP PLACEMENT: SHX6857

## 2020-09-22 HISTORY — PX: ENDOSCOPIC MUCOSAL RESECTION: SHX6839

## 2020-09-22 HISTORY — PX: ESOPHAGOGASTRODUODENOSCOPY (EGD) WITH PROPOFOL: SHX5813

## 2020-09-22 HISTORY — DX: Anemia, unspecified: D64.9

## 2020-09-22 HISTORY — DX: Personal history of other medical treatment: Z92.89

## 2020-09-22 HISTORY — PX: SUBMUCOSAL LIFTING INJECTION: SHX6855

## 2020-09-22 LAB — GLUCOSE, CAPILLARY: Glucose-Capillary: 127 mg/dL — ABNORMAL HIGH (ref 70–99)

## 2020-09-22 SURGERY — RESECTION, MUCOSAL LESION, GI TRACT, ENDOSCOPIC
Anesthesia: Monitor Anesthesia Care

## 2020-09-22 MED ORDER — PROPOFOL 500 MG/50ML IV EMUL
INTRAVENOUS | Status: DC | PRN
  Administered 2020-09-22: 125 ug/kg/min via INTRAVENOUS

## 2020-09-22 MED ORDER — LIDOCAINE 2% (20 MG/ML) 5 ML SYRINGE
INTRAMUSCULAR | Status: DC | PRN
  Administered 2020-09-22: 40 mg via INTRAVENOUS

## 2020-09-22 MED ORDER — SODIUM CHLORIDE 0.9 % IV SOLN: INTRAVENOUS | Status: DC

## 2020-09-22 MED ORDER — EPINEPHRINE 1 MG/10ML IJ SOSY
PREFILLED_SYRINGE | INTRAMUSCULAR | Status: AC
  Filled 2020-09-22: qty 10

## 2020-09-22 MED ORDER — LACTATED RINGERS IV SOLN
INTRAVENOUS | Status: DC
  Administered 2020-09-22: 1000 mL via INTRAVENOUS

## 2020-09-22 MED ORDER — SODIUM CHLORIDE (PF) 0.9 % IJ SOLN
PREFILLED_SYRINGE | INTRAMUSCULAR | Status: DC | PRN
  Administered 2020-09-22: 3 mL

## 2020-09-22 MED ORDER — SUCRALFATE 1 GM/10ML PO SUSP: 1.0000 g | Freq: Three times a day (TID) | ORAL | 1 refills | Status: DC

## 2020-09-22 SURGICAL SUPPLY — 14 items

## 2020-09-22 NOTE — Anesthesia Postprocedure Evaluation (Signed)
Anesthesia Post Note  Patient: [name]  Procedure(s) Performed: ENDOSCOPIC MUCOSAL RESECTION (N/A ) ESOPHAGOGASTRODUODENOSCOPY (EGD) WITH PROPOFOL (N/A ) SCLEROTHERAPY HEMOSTASIS CLIP PLACEMENT POLYPECTOMY FOREIGN BODY RETRIEVAL (N/A ) SUBMUCOSAL LIFTING INJECTION HEMOSTASIS CONTROL     Patient location during evaluation: PACU Anesthesia Type: MAC Level of consciousness: awake and alert Pain management: pain level controlled Vital Signs Assessment: post-procedure vital signs reviewed and stable Respiratory status: spontaneous breathing, nonlabored ventilation and respiratory function stable Cardiovascular status: blood pressure returned to baseline and stable Postop Assessment: no apparent nausea or vomiting Anesthetic complications: no   No complications documented.  Last Vitals:  Vitals:   09/22/20 0916 09/22/20 0930  BP: (!) 164/68 (!) 189/87  Pulse: (!) 57 60  Resp: 20 19  Temp:    SpO2: 100% 100%    Last Pain:  Vitals:   09/22/20 0902  PainSc: 0-No pain                 Pervis Hocking

## 2020-09-22 NOTE — Op Note (Signed)
[facility] Patient Name: [name] Procedure Date : 09/22/2020 MRN: [id number] Attending MD: Justice Britain , MD Date of Birth: 1946-05-03 CSN: [id number] Age: 75 Admit Type: Outpatient Procedure:                Upper GI endoscopy Indications:              Therapeutic procedure, Melena, Occult blood in                            stool, Recent gastrointestinal bleeding, Gastric                            polyps, For therapy of gastric polyps Providers:                Justice Britain, MD, Kary Kos RN, RN,                            Laverda Sorenson, Technician, Rhae Lerner, CRNA Referring MD:             Lennette Bihari. Bonna Gains MD, MD, Dwayne D. Clayborn Bigness,                            MD, Leona Carry. Hall Busing, MD Medicines:                Monitored Anesthesia Care Complications:            No immediate complications. Estimated Blood Loss:     Estimated blood loss was minimal. Procedure:                Pre-Anesthesia Assessment:                           - Prior to the procedure, a History and Physical                            was performed, and patient medications and                            allergies were reviewed. The patient's tolerance of                            previous anesthesia was also reviewed. The risks                            and benefits of the procedure and the sedation                            options and risks were discussed with the patient.                            All questions were answered, and informed consent                            was obtained. Prior Anticoagulants: The patient has  taken Plavix (clopidogrel), last dose was 14 days                            prior to procedure. ASA Grade Assessment: III - A                            patient with severe systemic disease. After                            reviewing the risks and benefits, the patient was                            deemed in satisfactory  condition to undergo the                            procedure.                           After obtaining informed consent, the endoscope was                            passed under direct vision. Throughout the                            procedure, the patient's blood pressure, pulse, and                            oxygen saturations were monitored continuously. The                            GIF-H190 ([id number]) Olympus gastroscope was                            introduced through the mouth, and advanced to the                            second part of duodenum. The upper GI endoscopy was                            accomplished without difficulty. The patient                            tolerated the procedure. Scope In: Scope Out: Findings:      No gross lesions were noted in the entire esophagus.      A widely patent and non-obstructing Schatzki ring was found at the       gastroesophageal junction.      A 2 cm hiatal hernia was present.      Multiple 4 to 25 mm pedunculated and sessile polyps with some with 2       active oozing/bleeding and 3 showing stigmata of recent bleeding were       found in the cardia, in the gastric body, at the incisura and in the       gastric antrum. The largest lesion was in the proximal antrum on the  lesser curve. Preparations were made for mucosal resection. NBI imaging       and White-[place] endoscopy was done to demarcate the borders of the       lesion. A 1:100,000 solution of epinephrine was injected to raise the       lesion (total of 3.5 mL). Snare mucosal resection was performed.       Resection and retrieval were complete. To prevent bleeding after mucosal       resection, four hemostatic clips were successfully placed (MR       conditional). There was no bleeding at the end of the procedure. The       other 4 polyps that were oozing or oozed with manipulation were removed       with a saline injection-lift technique using a hot snare each.  Resection       and retrieval were complete. To prevent bleeding after the polypectomy,       four hemostatic clips were successfully placed (MR conditional) on a       gastric body site. There was no bleeding at the end of the procedure. To       prevent bleeding after the polypectomy, two hemostatic clips were       successfully placed (MR conditional) on a gastric cardia site. There was       no bleeding at the end of the procedure. To prevent bleeding after the       polypectomy, two hemostatic clips were successfully placed (MR       conditional) on a gastric incisura site. There was no bleeding at the       end of the procedure. To prevent bleeding after the polypectomy, two       hemostatic clips were successfully placed (MR conditional) on a gastric       body site. There was no bleeding at the end of the procedure.      No gross lesions were noted in the duodenal bulb, in the first portion       of the duodenum and in the second portion of the duodenum. Impression:               - No gross lesions in esophagus.                           - Widely patent and non-obstructing Schatzki ring.                           - 2 cm hiatal hernia.                           - Multiple gastric polyps. The polyps that had                            stigmata or were actively oozing were removed (5 in                            total. 1 with Mucosal resection and 4 with saline                            lift polypectomy) Clips (MR conditional) were  placed on each site to decrease risk of bleeding                            post-intervention.                           - No gross lesions in the duodenal bulb, in the                            first portion of the duodenum and in the second                            portion of the duodenum. Recommendation:           - The patient will be observed post-procedure,                            until all discharge criteria are  met.                           - Discharge patient to home.                           - Patient has a contact number available for                            emergencies. The signs and symptoms of potential                            delayed complications were discussed with the                            patient. Return to normal activities tomorrow.                            Written discharge instructions were provided to the                            patient.                           - Full liquid diet today and if doing well into                            tomorrow then soft diet for 24 hours and then                            advance to regular diet.                           - Observe patient's clinical course.                           - Trend Hgb/Hct.                           -  Continue PPI 40 mg twice daily.                           - Initiate Carafate 1 g QAC + QHS if able to                            tolerate with other medications but at least twice                            daily (make sure to take no medications within 1                            hour of administration).                           - PO Iron can re-initiate in 1-week. Will benefit                            from IV Iron infusions as well.                           - Decision tree in regards to restart of Plavix vs                            Aspirin vs both is not an easy one, though I think                            best to be on at least one. I feel comfortable with                            restart of Aspirin in 72 hours (2/13 PM). It is                            possible he could have recurrent IDA and bleeding                            from the other polyps that are still present but                            all that had oozing with manipulation today were                            removed. I will forward this to the patient's                            primary GI and Cardiology team to  help decide about                            Plavix restart depending on how he does, but I will  allow patient to restart Aspirin first in 3-days.                           - The findings and recommendations were discussed                            with the patient.                           - The findings and recommendations were discussed                            with the patient's family. Procedure Code(s):        --- Professional ---                           [phone], Esophagogastroduodenoscopy, flexible,                            transoral; with endoscopic mucosal resection Diagnosis Code(s):        --- Professional ---                           K22.2, Esophageal obstruction                           K44.9, Diaphragmatic hernia without obstruction or                            gangrene                           K31.7, Polyp of stomach and duodenum                           K92.1, Melena (includes Hematochezia)                           R19.5, Other fecal abnormalities                           K92.2, Gastrointestinal hemorrhage, unspecified CPT copyright 2019 [employer]. All rights reserved. The codes documented in this report are preliminary and upon coder review may  be revised to meet current compliance requirements. Justice Britain, MD 09/22/2020 9:12:24 AM Number of Addenda: 0

## 2020-09-22 NOTE — Transfer of Care (Signed)
Immediate Anesthesia Transfer of Care Note  Patient: [name]  Procedure(s) Performed: ENDOSCOPIC MUCOSAL RESECTION (N/A ) ESOPHAGOGASTRODUODENOSCOPY (EGD) WITH PROPOFOL (N/A ) SCLEROTHERAPY HEMOSTASIS CLIP PLACEMENT POLYPECTOMY FOREIGN BODY RETRIEVAL (N/A ) SUBMUCOSAL LIFTING INJECTION HEMOSTASIS CONTROL  Patient Location: PACU  Anesthesia Type:MAC  Level of Consciousness: awake, alert  and oriented  Airway & Oxygen Therapy: Patient Spontanous Breathing and Patient connected to nasal cannula oxygen  Post-op Assessment: Report given to RN, Post -op Vital signs reviewed and stable and Patient moving all extremities  Post vital signs: Reviewed and stable  Last Vitals:  Vitals Value Taken Time  BP    Temp    Pulse 71 09/22/20 0901  Resp 14 09/22/20 0901  SpO2 97 % 09/22/20 0901  Vitals shown include unvalidated device data.  Last Pain:  Vitals:   09/22/20 3149  PainSc: 0-No pain         Complications: No complications documented.

## 2020-09-22 NOTE — H&P (Signed)
GASTROENTEROLOGY PROCEDURE H&P NOTE   Primary Care Physician: Albina Billet, MD  HPI: [name] is a 75 y.o. male who presents for EGD/EUS to evaluate and possibly remove gastric hyperplastic oozing polyp that may be leading to IDA.  Past Medical History:  Diagnosis Date  . Anemia   . Arthritis   . Chronic kidney disease    patient not aware of this dx  . Coronary artery disease   . Diabetes mellitus without complication ([place])    type 2  . Dyspnea   . GERD (gastroesophageal reflux disease)   . History of blood transfusion   . Hypertension   . Sleep apnea    does not use cpap   Past Surgical History:  Procedure Laterality Date  . APPENDECTOMY    . CARDIAC CATHETERIZATION Left 08/16/2016   Procedure: Left Heart Cath and Coronary Angiography;  Surgeon: Yolonda Kida, MD;  Location: [place] [facility];  Service: Cardiovascular;  Laterality: Left;  . COLON SURGERY     part of colon removed  . COLONOSCOPY N/A 09/07/2020   Procedure: COLONOSCOPY;  Surgeon: Virgel Manifold, MD;  Location: [facility] ENDOSCOPY;  Service: Endoscopy;  Laterality: N/A;  . CORONARY ARTERY BYPASS GRAFT  08/30/2016   triple  . CORONARY STENT INTERVENTION N/A 06/09/2020   Procedure: CORONARY STENT INTERVENTION;  Surgeon: Yolonda Kida, MD;  Location: [place] [facility];  Service: Cardiovascular;  Laterality: N/A;  . ESOPHAGOGASTRODUODENOSCOPY N/A 09/07/2020   Procedure: ESOPHAGOGASTRODUODENOSCOPY (EGD);  Surgeon: Virgel Manifold, MD;  Location: [facility] ENDOSCOPY;  Service: Endoscopy;  Laterality: N/A;  . LEFT HEART CATH AND CORONARY ANGIOGRAPHY Left 06/09/2020   Procedure: LEFT HEART CATH AND CORONARY ANGIOGRAPHY;  Surgeon: Yolonda Kida, MD;  Location: [place] [facility];  Service: Cardiovascular;  Laterality: Left;  . LEFT HEART CATH AND CORS/GRAFTS ANGIOGRAPHY Left 11/20/2016   Procedure: Left Heart Cath and Cors/Grafts Angiography;  Surgeon: Yolonda Kida, MD;  Location:  [place] [facility];  Service: Cardiovascular;  Laterality: Left;   Current Facility-Administered Medications  Medication Dose Route Frequency Provider Last Rate Last Admin  . 0.9 %  sodium chloride infusion   Intravenous Continuous Mansouraty, Telford Nab., MD      . lactated ringers infusion   Intravenous Continuous Mansouraty, Telford Nab., MD 10 mL/hr at 09/22/20 0641 1,000 mL at 09/22/20 0641   No Known Allergies History reviewed. No pertinent family history. Social History   Socioeconomic History  . Marital status: Married    Spouse name: Not on file  . Number of children: Not on file  . Years of education: Not on file  . Highest education level: Not on file  Occupational History  . Not on file  Tobacco Use  . Smoking status: Never Smoker  . Smokeless tobacco: Never Used  Vaping Use  . Vaping Use: Never used  Substance and Sexual Activity  . Alcohol use: No  . Drug use: No  . Sexual activity: Yes  Other Topics Concern  . Not on file  Social History Narrative  . Not on file   Social Determinants of Health   Financial Resource Strain: Not on file  Food Insecurity: Not on file  Transportation Needs: Not on file  Physical Activity: Not on file  Stress: Not on file  Social Connections: Not on file  Intimate Partner Violence: Not on file    Physical Exam: Vital signs in last 24 hours: Temp:  [98.1 F (36.7 C)] 98.1  F (36.7 C) (02/10 0628) Pulse Rate:  [63-66] 63 (02/10 0643) Resp:  [19] 19 (02/10 0632) BP: (162-199)/(63-79) 162/63 (02/10 0643) SpO2:  [97 %] 97 % (02/10 5885) Weight:  [117.9 kg] 117.9 kg (02/10 0277)   GEN: NAD EYE: Sclerae anicteric ENT: MMM CV: Non-tachycardic GI: Soft, NT/ND NEURO:  Alert & Oriented x 3  Lab Results: No results for input(s): WBC, HGB, HCT, PLT in the last 72 hours. BMET No results for input(s): NA, K, CL, CO2, GLUCOSE, BUN, CREATININE, CALCIUM in the last 72 hours. LFT No results for input(s): PROT, ALBUMIN, AST,  ALT, ALKPHOS, BILITOT, BILIDIR, IBILI in the last 72 hours. PT/INR No results for input(s): LABPROT, INR in the last 72 hours.   Impression / Plan: This is a 75 y.o.male who presents for EGD/EUS to evaluate and possibly remove gastric hyperplastic oozing polyp that may be leading to IDA.  The risks of an EUS including intestinal perforation, bleeding, infection, aspiration, and medication effects were discussed as was the possibility it may not give a definitive diagnosis if a biopsy is performed.  When a biopsy of the pancreas is done as part of the EUS, there is an additional risk of pancreatitis at the rate of about 1-2%.  It was explained that procedure related pancreatitis is typically mild, although it can be severe and even life threatening, which is why we do not perform random pancreatic biopsies and only biopsy a lesion/area we feel is concerning enough to warrant the risk.  The risks and benefits of endoscopic evaluation were discussed with the patient; these include but are not limited to the risk of perforation, infection, bleeding, missed lesions, lack of diagnosis, severe illness requiring hospitalization, as well as anesthesia and sedation related illnesses.  The patient is agreeable to proceed.    Justice Britain, MD [facility]oscopy Office # [phone]

## 2020-09-22 NOTE — Discharge Instructions (Signed)
Start Aspirin Sunday, 2/13. Would not recommend Plavix and Aspirin. In 1 week can restart iron. Full Liquids today, 2/10. Soft Diet, 2/11.

## 2020-09-22 NOTE — Anesthesia Procedure Notes (Signed)
Procedure Name: MAC Date/Time: 09/22/2020 7:52 AM Performed by: Kyung Rudd, CRNA Pre-anesthesia Checklist: Patient identified, Emergency Drugs available, Suction available and Patient being monitored Patient Re-evaluated:Patient Re-evaluated prior to induction Oxygen Delivery Method: Nasal cannula Preoxygenation: Pre-oxygenation with 100% oxygen Induction Type: IV induction Placement Confirmation: positive ETCO2 Dental Injury: Teeth and Oropharynx as per pre-operative assessment

## 2020-09-23 LAB — SURGICAL PATHOLOGY

## 2020-09-25 ENCOUNTER — Encounter (HOSPITAL_COMMUNITY): Payer: Self-pay | Admitting: Gastroenterology

## 2020-09-25 ENCOUNTER — Encounter: Payer: Self-pay | Admitting: Gastroenterology

## 2020-09-29 LAB — GLUCOSE, CAPILLARY: Glucose-Capillary: 104 mg/dL — ABNORMAL HIGH (ref 70–99)

## 2020-10-04 ENCOUNTER — Other Ambulatory Visit: Payer: Self-pay | Admitting: Internal Medicine

## 2020-10-04 DIAGNOSIS — E079 Disorder of thyroid, unspecified: Secondary | ICD-10-CM

## 2020-10-12 ENCOUNTER — Other Ambulatory Visit: Payer: Self-pay

## 2020-10-12 ENCOUNTER — Ambulatory Visit
Admission: RE | Admit: 2020-10-12 | Discharge: 2020-10-12 | Disposition: A | Discharge status: Home / Self Care | Payer: Medicare HMO | Source: Ambulatory Visit | Attending: Internal Medicine | Admitting: Internal Medicine

## 2020-10-12 DIAGNOSIS — E079 Disorder of thyroid, unspecified: Secondary | ICD-10-CM | POA: Diagnosis not present

## 2020-10-26 ENCOUNTER — Ambulatory Visit: Payer: Medicare HMO | Admitting: Gastroenterology

## 2020-10-27 ENCOUNTER — Other Ambulatory Visit: Payer: Self-pay

## 2020-10-27 ENCOUNTER — Ambulatory Visit (INDEPENDENT_AMBULATORY_CARE_PROVIDER_SITE_OTHER): Payer: Medicare HMO | Admitting: Vascular Surgery

## 2020-10-27 ENCOUNTER — Encounter (INDEPENDENT_AMBULATORY_CARE_PROVIDER_SITE_OTHER): Payer: Self-pay | Admitting: Vascular Surgery

## 2020-10-27 VITALS — BP 184/71 | HR 69 | Ht 73.0 in | Wt 258.0 lb

## 2020-10-27 DIAGNOSIS — I890 Lymphedema, not elsewhere classified: Secondary | ICD-10-CM | POA: Diagnosis not present

## 2020-10-27 DIAGNOSIS — I10 Essential (primary) hypertension: Secondary | ICD-10-CM

## 2020-10-27 DIAGNOSIS — I2510 Atherosclerotic heart disease of native coronary artery without angina pectoris: Secondary | ICD-10-CM | POA: Diagnosis not present

## 2020-10-27 DIAGNOSIS — E119 Type 2 diabetes mellitus without complications: Secondary | ICD-10-CM

## 2020-10-27 DIAGNOSIS — E7849 Other hyperlipidemia: Secondary | ICD-10-CM | POA: Diagnosis not present

## 2020-10-27 DIAGNOSIS — Z794 Long term (current) use of insulin: Secondary | ICD-10-CM

## 2020-10-27 NOTE — Progress Notes (Signed)
MRN : [id number]  [name] is a 75 y.o. ([birth year]) male who presents with chief complaint of  Chief Complaint  Patient presents with  . New Patient (Initial Visit)    [name]. Edema. Venous stasis  .  History of Present Illness:   Patient is seen for evaluation of leg swelling. The patient first noticed the swelling remotely but is now concerned because of a significant increase in the overall edema. The swelling is associated with pain and discoloration. The patient notes that in the morning the legs are significantly improved but they steadily worsened throughout the course of the day. Elevation makes the legs better, dependency makes them much worse.   There is no history of ulcerations associated with the swelling.   The patient denies any recent changes in their medications.  The patient has not been wearing graduated compression.  The patient has no had any past angiography, interventions or vascular surgery.  The patient denies a history of DVT or PE. There is no prior history of phlebitis. There is no history of primary lymphedema.  There is no history of radiation treatment to the groin or pelvis No history of malignancies. No history of trauma or groin or pelvic surgery. No history of foreign travel or parasitic infections area    Current Meds  Medication Sig  . acetaminophen (TYLENOL) 325 MG tablet Take 2 tablets (650 mg total) by mouth every 6 (six) hours as needed for mild pain (or Fever >/= 101).  Marland Kitchen amLODipine (NORVASC) 10 MG tablet Take 10 mg by mouth daily.  . hydrALAZINE (APRESOLINE) 10 MG tablet Take 1 tablet (10 mg total) by mouth 3 (three) times daily.  . hydrALAZINE (APRESOLINE) 25 MG tablet Take 25 mg by mouth 3 (three) times daily.  . hydrochlorothiazide (HYDRODIURIL) 12.5 MG tablet Take 12.5 mg by mouth daily.  . insulin glargine (LANTUS) 100 UNIT/ML injection Inject 25 Units into the skin at bedtime.   . irbesartan (AVAPRO) 300 MG tablet Take  1 tablet (300 mg total) by mouth daily.  . metFORMIN (GLUCOPHAGE-XR) 500 MG 24 hr tablet Take 500 mg by mouth 2 (two) times daily.  Marland Kitchen omeprazole (PRILOSEC) 40 MG capsule Take 1 capsule (40 mg total) by mouth 2 (two) times daily with a meal.  . rosuvastatin (CRESTOR) 40 MG tablet Take 40 mg by mouth daily.  . sitaGLIPtin (JANUVIA) 100 MG tablet Take 100 mg by mouth daily.  . sucralfate (CARAFATE) 1 GM/10ML suspension Take 10 mLs (1 g total) by mouth 4 (four) times daily -  with meals and at bedtime.  . Testosterone 1.62 % GEL APPLY 2 PUMPS TO THE SKIN DAILY (Patient taking differently: Apply 2 Pump topically daily. One pump on each arm)  . vitamin B-12 (CYANOCOBALAMIN) 1000 MCG tablet Take 1 tablet (1,000 mcg total) by mouth daily.    Past Medical History:  Diagnosis Date  . Anemia   . Arthritis   . Chronic kidney disease    patient not aware of this dx  . Coronary artery disease   . Diabetes mellitus without complication ([place])    type 2  . Dyspnea   . GERD (gastroesophageal reflux disease)   . History of blood transfusion   . Hypertension   . Sleep apnea    does not use cpap    Past Surgical History:  Procedure Laterality Date  . APPENDECTOMY    . CARDIAC CATHETERIZATION Left 08/16/2016   Procedure: Left Heart Cath and Coronary Angiography;  Surgeon: Yolonda Kida, MD;  Location: [place] [facility];  Service: Cardiovascular;  Laterality: Left;  . COLON SURGERY     part of colon removed  . COLONOSCOPY N/A 09/07/2020   Procedure: COLONOSCOPY;  Surgeon: Virgel Manifold, MD;  Location: [facility] ENDOSCOPY;  Service: Endoscopy;  Laterality: N/A;  . CORONARY ARTERY BYPASS GRAFT  08/30/2016   triple  . CORONARY STENT INTERVENTION N/A 06/09/2020   Procedure: CORONARY STENT INTERVENTION;  Surgeon: Yolonda Kida, MD;  Location: [place] [facility];  Service: Cardiovascular;  Laterality: N/A;  . ENDOSCOPIC MUCOSAL RESECTION N/A 09/22/2020   Procedure: ENDOSCOPIC MUCOSAL  RESECTION;  Surgeon: Rush Landmark Telford Nab., MD;  Location: [place];  Service: Gastroenterology;  Laterality: N/A;  . ESOPHAGOGASTRODUODENOSCOPY N/A 09/07/2020   Procedure: ESOPHAGOGASTRODUODENOSCOPY (EGD);  Surgeon: Virgel Manifold, MD;  Location: [facility] ENDOSCOPY;  Service: Endoscopy;  Laterality: N/A;  . ESOPHAGOGASTRODUODENOSCOPY (EGD) WITH PROPOFOL N/A 09/22/2020   Procedure: ESOPHAGOGASTRODUODENOSCOPY (EGD) WITH PROPOFOL;  Surgeon: Rush Landmark Telford Nab., MD;  Location: [place];  Service: Gastroenterology;  Laterality: N/A;  . HEMOSTASIS CLIP PLACEMENT  09/22/2020   Procedure: HEMOSTASIS CLIP PLACEMENT;  Surgeon: Irving Copas., MD;  Location: [place];  Service: Gastroenterology;;  . HEMOSTASIS CONTROL  09/22/2020   Procedure: HEMOSTASIS CONTROL;  Surgeon: Irving Copas., MD;  Location: [place];  Service: Gastroenterology;;  . LEFT HEART CATH AND CORONARY ANGIOGRAPHY Left 06/09/2020   Procedure: LEFT HEART CATH AND CORONARY ANGIOGRAPHY;  Surgeon: Yolonda Kida, MD;  Location: [place] [facility];  Service: Cardiovascular;  Laterality: Left;  . LEFT HEART CATH AND CORS/GRAFTS ANGIOGRAPHY Left 11/20/2016   Procedure: Left Heart Cath and Cors/Grafts Angiography;  Surgeon: Yolonda Kida, MD;  Location: [place] [facility];  Service: Cardiovascular;  Laterality: Left;  . POLYPECTOMY  09/22/2020   Procedure: POLYPECTOMY;  Surgeon: Mansouraty, Telford Nab., MD;  Location: [place];  Service: Gastroenterology;;  . Lia Foyer LIFTING INJECTION  09/22/2020   Procedure: SUBMUCOSAL LIFTING INJECTION;  Surgeon: Irving Copas., MD;  Location: [facility] ENDOSCOPY;  Service: Gastroenterology;;    Social History Social History   Tobacco Use  . Smoking status: Never Smoker  . Smokeless tobacco: Never Used  Vaping Use  . Vaping Use: Never used  Substance Use Topics  . Alcohol use: No  . Drug use: No    Family History No family history of  bleeding/clotting disorders, porphyria or autoimmune disease   No Known Allergies   REVIEW OF SYSTEMS (Negative unless checked)  Constitutional: [] Weight loss  [] Fever  [] Chills Cardiac: [] Chest pain   [] Chest pressure   [] Palpitations   [] Shortness of breath when laying flat   [] Shortness of breath with exertion. Vascular:  [] Pain in legs with walking   [x] Pain in legs at rest  [] History of DVT   [] Phlebitis   [x] Swelling in legs   [] Varicose veins   [] Non-healing ulcers Pulmonary:   [] Uses home oxygen   [] Productive cough   [] Hemoptysis   [] Wheeze  [] COPD   [] Asthma Neurologic:  [] Dizziness   [] Seizures   [] History of stroke   [] History of TIA  [] Aphasia   [] Vissual changes   [] Weakness or numbness in arm   [] Weakness or numbness in leg Musculoskeletal:   [] Joint swelling   [x] Joint pain   [] Low back pain Hematologic:  [] Easy bruising  [] Easy bleeding   [] Hypercoagulable state   [] Anemic Gastrointestinal:  [] Diarrhea   [] Vomiting  [] Gastroesophageal reflux/heartburn   [] Difficulty swallowing. Genitourinary:  [] Chronic kidney disease   []   Difficult urination  [] Frequent urination   [] Blood in urine Skin:  [x] Rashes   [] Ulcers  Psychological:  [] History of anxiety   []  History of major depression.  Physical Examination  Vitals:   10/27/20 1321  BP: (!) 184/71  Pulse: 69  Weight: 258 lb (117 kg)  Height: 6\' 1"  (1.854 m)   Body mass index is 34.04 kg/m. Gen: WD/WN, NAD Head: [place]/AT, No temporalis wasting.  Ear/Nose/Throat: Hearing grossly intact, nares w/o erythema or drainage, poor dentition Eyes: PER, EOMI, sclera nonicteric.  Neck: Supple, no masses.  No bruit or JVD.  Pulmonary:  Good air movement, clear to auscultation bilaterally, no use of accessory muscles.  Cardiac: RRR, normal S1, S2, no Murmurs. Vascular: scattered varicosities present bilaterally.  Moderate to severe venous stasis changes to the legs bilaterally.  4+ hard pitting edema left > right Vessel Right Left   Radial Palpable Palpable  PT Palpable Palpable  DP Palpable Palpable  Gastrointestinal: soft, non-distended. No guarding/no peritoneal signs.  Musculoskeletal: M/S 5/5 throughout.  No deformity or atrophy.  Neurologic: CN 2-12 intact. Pain and light touch intact in extremities.  Symmetrical.  Speech is fluent. Motor exam as listed above. Psychiatric: Judgment intact, Mood & affect appropriate for pt's clinical situation. Dermatologic: Moderate venous rashes no ulcers noted at this time.  No changes consistent with cellulitis. Lymph : +lichenification or skin changes of chronic lymphedema.  CBC Lab Results  Component Value Date   WBC 5.5 09/08/2020   HGB 9.8 (L) 09/08/2020   HCT 31.9 (L) 09/08/2020   MCV 80.2 09/08/2020   PLT 176 09/08/2020    BMET    Component Value Date/Time   NA 139 09/08/2020 0650   NA 139 08/04/2012 1103   K 3.5 09/08/2020 0650   K 4.1 08/04/2012 1103   CL 104 09/08/2020 0650   CL 105 08/04/2012 1103   CO2 26 09/08/2020 0650   CO2 27 08/04/2012 1103   GLUCOSE 214 (H) 09/08/2020 0650   GLUCOSE 126 (H) 08/04/2012 1103   BUN 12 09/08/2020 0650   BUN 17 08/04/2012 1103   CREATININE 1.19 09/08/2020 0650   CREATININE 1.03 08/04/2012 1103   CALCIUM 9.8 09/08/2020 0650   CALCIUM 10.8 (H) 09/03/2020 0857   GFRNONAA >60 09/08/2020 0650   GFRNONAA >60 08/04/2012 1103   GFRAA >60 02/11/2017 0915   GFRAA >60 08/04/2012 1103   CrCl cannot be calculated (Patient's most recent lab result is older than the maximum 21 days allowed.).  COAG No results found for: INR, PROTIME  Radiology US THYROID  Result Date: 10/12/2020 CLINICAL DATA:  Thyroid nodule follow-up EXAM: THYROID ULTRASOUND TECHNIQUE: Ultrasound examination of the thyroid gland and adjacent soft tissues was performed. COMPARISON:  07/19/2009 FINDINGS: Parenchymal Echotexture: Moderately heterogenous Isthmus: 1.4 cm Right lobe: 4.9 x 2.2 x 2.4 cm Left lobe: 4 x 2.4 x 1.7 cm  _________________________________________________________ Estimated total number of nodules >/= 1 cm: 1 Number of spongiform nodules >/=  2 cm not described below (TR1): 0 Number of mixed cystic and solid nodules >/= 1.5 cm not described below (TR2): 0 _________________________________________________________ Examination is limited by patient body habitus. The thyroid gland is deep beneath significant subcutaneous soft tissue. Nodule # 1: Prior biopsy: No Location: Right; Inferior Maximum size: 2.9 cm; Other 2 dimensions: 2.8 x 2.8 cm, previously, 3.3 x 2.8 x 2.7 cm Composition: solid/almost completely solid (2) Echogenicity: isoechoic (1) Shape: not taller-than-wide (0) Margins: ill-defined (0) Echogenic foci: none (0) ACR TI-RADS total points: 3.  ACR TI-RADS risk category:  TR3 (3 points). Significant change in size (>/= 20% in two dimensions and minimal increase of 2 mm): No Change in features: No Change in ACR TI-RADS risk category: No ACR TI-RADS recommendations: **Given size (>/= 2.5 cm) and appearance, fine needle aspiration of this mildly suspicious nodule should be considered based on TI-RADS criteria. _________________________________________________________ There is a small subcentimeter thyroid nodule in the left inferior thyroid gland measuring up to approximately 0.8 cm. This does not meet criteria for follow-up or fine-needle aspiration. IMPRESSION: 1. Very limited study secondary to depth of the thyroid gland and patient body habitus. 2. Persistent TR 3 thyroid nodule in the right inferior thyroid gland measuring up to 2.9 cm (previously measuring 3.3 cm). While this thyroid nodule meets criteria for fine-needle aspiration, its stability across greater than 10 years is consistent with a benign process. The above is in keeping with the ACR TI-RADS recommendations - J Am Coll Radiol 2017;14:587-595. Electronically Signed   By: Constance Holster M.D.   On: 10/12/2020 19:08     Assessment/Plan 1.  Lymphedema I have had a long discussion with the patient regarding swelling and why it  causes symptoms.  Patient will begin wearing graduated compression stockings class 1 (20-30 mmHg) on a daily basis a prescription was given. The patient will  beginning wearing the stockings first thing in the morning and removing them in the evening. The patient is instructed specifically not to sleep in the stockings.   In addition, behavioral modification will be initiated.  This will include frequent elevation, use of over the counter pain medications and exercise such as walking.  I have reviewed systemic causes for chronic edema such as liver, kidney and cardiac etiologies.  The patient denies problems with these organ systems.    Consideration for a lymph pump will also be made based upon the effectiveness of conservative therapy.  This would help to improve the edema control and prevent sequela such as ulcers and infections   Patient should undergo duplex ultrasound of the venous system to ensure that DVT or reflux is not present.  The patient will follow-up with me after the ultrasound.   - VAS [place] LOWER EXTREMITY VENOUS (DVT); Future  2. Coronary artery disease involving native coronary artery of native heart without angina pectoris Continue cardiac and antihypertensive medications as already ordered and reviewed, no changes at this time.  Continue statin as ordered and reviewed, no changes at this time  Nitrates PRN for chest pain   3. Essential hypertension Continue antihypertensive medications as already ordered, these medications have been reviewed and there are no changes at this time.   4. Other hyperlipidemia Continue statin as ordered and reviewed, no changes at this time   5. Insulin dependent type 2 diabetes mellitus ([place]) Continue hypoglycemic medications as already ordered, these medications have been reviewed and there are no changes at this time.  Hgb A1C to be monitored as  already arranged by primary service     Hortencia Pilar, MD  10/27/2020 1:33 PM

## 2020-11-01 ENCOUNTER — Other Ambulatory Visit: Payer: Self-pay

## 2020-11-01 ENCOUNTER — Encounter: Payer: Self-pay | Admitting: Gastroenterology

## 2020-11-01 ENCOUNTER — Ambulatory Visit: Payer: Medicare HMO | Admitting: Gastroenterology

## 2020-11-01 VITALS — BP 191/86 | HR 78 | Temp 98.6°F | Ht 73.0 in | Wt 263.8 lb

## 2020-11-01 DIAGNOSIS — D500 Iron deficiency anemia secondary to blood loss (chronic): Secondary | ICD-10-CM

## 2020-11-01 DIAGNOSIS — K317 Polyp of stomach and duodenum: Secondary | ICD-10-CM

## 2020-11-01 NOTE — Progress Notes (Signed)
Vonda Antigua, MD [address]  [place], [place] [address]  Main: [phone]  Fax: [phone]   Primary Care Physician: Albina Billet, MD   Chief Complaint  Patient presents with  . IDA    Patient stated that he had not seen blood in his stools.    HPI: [name] is a 74 y.o. male here for follow-up of iron deficiency anemia.  Patient has undergone EGD with Dr. Rush Landmark with removal of large gastric hyperplastic polyps that were thought to be causing his iron deficiency anemia.  Patient states he feels better than he has in a while with improved energy level since the procedure.  No melena.  No abdominal pain.  No nausea or vomiting.  Has been taking aspirin but has not restarted Plavix as per Dr. Donneta Romberg instructions after his upper endoscopy  Current Outpatient Medications  Medication Sig Dispense Refill  . acetaminophen (TYLENOL) 325 MG tablet Take 2 tablets (650 mg total) by mouth every 6 (six) hours as needed for mild pain (or Fever >/= 101).    Marland Kitchen amLODipine (NORVASC) 10 MG tablet Take 10 mg by mouth daily.    Marland Kitchen aspirin EC 81 MG tablet Take 81 mg by mouth daily. Swallow whole.    . hydrALAZINE (APRESOLINE) 25 MG tablet Take 25 mg by mouth 3 (three) times daily.    . hydrochlorothiazide (HYDRODIURIL) 12.5 MG tablet Take 12.5 mg by mouth daily.    . insulin glargine (LANTUS) 100 UNIT/ML injection Inject 25 Units into the skin at bedtime.     . irbesartan (AVAPRO) 300 MG tablet Take 1 tablet (300 mg total) by mouth daily. 30 tablet 1  . metFORMIN (GLUCOPHAGE-XR) 500 MG 24 hr tablet Take 500 mg by mouth 2 (two) times daily.    . nebivolol (BYSTOLIC) 10 MG tablet Take 10 mg by mouth daily.     Marland Kitchen omeprazole (PRILOSEC) 40 MG capsule Take 1 capsule (40 mg total) by mouth 2 (two) times daily with a meal. 60 capsule 0  . rosuvastatin (CRESTOR) 40 MG tablet Take 40 mg by mouth daily.    . sitaGLIPtin (JANUVIA) 100 MG tablet Take 100 mg by mouth  daily.    . sucralfate (CARAFATE) 1 GM/10ML suspension Take 10 mLs (1 g total) by mouth 4 (four) times daily -  with meals and at bedtime. 420 mL 1  . Testosterone 1.62 % GEL APPLY 2 PUMPS TO THE SKIN DAILY (Patient taking differently: Apply 2 Pump topically daily. One pump on each arm) 225 g 0  . triamcinolone (KENALOG) 0.025 % cream Apply 1 application topically 3 (three) times a week.    . vitamin B-12 (CYANOCOBALAMIN) 1000 MCG tablet Take 1 tablet (1,000 mcg total) by mouth daily. 30 tablet 0   No current facility-administered medications for this visit.    Allergies as of 11/01/2020  . (No Known Allergies)    ROS:  General: Negative for anorexia, weight loss, fever, chills, fatigue, weakness. ENT: Negative for hoarseness, difficulty swallowing , nasal congestion. CV: Negative for chest pain, angina, palpitations, dyspnea on exertion, peripheral edema.  Respiratory: Negative for dyspnea at rest, dyspnea on exertion, cough, sputum, wheezing.  GI: See history of present illness. GU:  Negative for dysuria, hematuria, urinary incontinence, urinary frequency, nocturnal urination.  Endo: Negative for unusual weight change.    Physical Examination:   BP (!) 191/86   Pulse 78   Temp 98.6 F (37 C) (Skin)  Ht 6\' 1"  (1.854 m)   Wt 263 lb 12.8 oz (119.7 kg)   BMI 34.80 kg/m   General: Well-nourished, well-developed in no acute distress.  Eyes: No icterus. Conjunctivae pink. Mouth: Oropharyngeal mucosa moist and pink , no lesions erythema or exudate. Neck: Supple, Trachea midline Abdomen: Bowel sounds are normal, nontender, nondistended, no hepatosplenomegaly or masses, no abdominal bruits or hernia , no rebound or guarding.   Extremities: No lower extremity edema. No clubbing or deformities. Neuro: Alert and oriented x 3.  Grossly intact. Skin: Warm and dry, no jaundice.   Psych: Alert and cooperative, normal mood and affect.   Labs: CMP     Component Value Date/Time   NA  139 09/08/2020 0650   NA 139 08/04/2012 1103   K 3.5 09/08/2020 0650   K 4.1 08/04/2012 1103   CL 104 09/08/2020 0650   CL 105 08/04/2012 1103   CO2 26 09/08/2020 0650   CO2 27 08/04/2012 1103   GLUCOSE 214 (H) 09/08/2020 0650   GLUCOSE 126 (H) 08/04/2012 1103   BUN 12 09/08/2020 0650   BUN 17 08/04/2012 1103   CREATININE 1.19 09/08/2020 0650   CREATININE 1.03 08/04/2012 1103   CALCIUM 9.8 09/08/2020 0650   CALCIUM 10.8 (H) 09/03/2020 0857   PROT 6.4 (L) 09/08/2020 0650   PROT 7.6 08/04/2012 1103   ALBUMIN 3.7 09/08/2020 0650   ALBUMIN 4.1 08/04/2012 1103   AST 24 09/08/2020 0650   AST 24 08/04/2012 1103   ALT 20 09/08/2020 0650   ALT 33 08/04/2012 1103   ALKPHOS 88 09/08/2020 0650   ALKPHOS 117 08/04/2012 1103   BILITOT 0.4 09/08/2020 0650   BILITOT 0.3 08/04/2012 1103   GFRNONAA >60 09/08/2020 0650   GFRNONAA >60 08/04/2012 1103   GFRAA >60 02/11/2017 0915   GFRAA >60 08/04/2012 1103   Lab Results  Component Value Date   WBC 5.5 09/08/2020   HGB 9.8 (L) 09/08/2020   HCT 31.9 (L) 09/08/2020   MCV 80.2 09/08/2020   PLT 176 09/08/2020    Imaging Studies: US THYROID  Result Date: 10/12/2020 CLINICAL DATA:  Thyroid nodule follow-up EXAM: THYROID ULTRASOUND TECHNIQUE: Ultrasound examination of the thyroid gland and adjacent soft tissues was performed. COMPARISON:  07/19/2009 FINDINGS: Parenchymal Echotexture: Moderately heterogenous Isthmus: 1.4 cm Right lobe: 4.9 x 2.2 x 2.4 cm Left lobe: 4 x 2.4 x 1.7 cm _________________________________________________________ Estimated total number of nodules >/= 1 cm: 1 Number of spongiform nodules >/=  2 cm not described below (TR1): 0 Number of mixed cystic and solid nodules >/= 1.5 cm not described below (TR2): 0 _________________________________________________________ Examination is limited by patient body habitus. The thyroid gland is deep beneath significant subcutaneous soft tissue. Nodule # 1: Prior biopsy: No Location: Right;  Inferior Maximum size: 2.9 cm; Other 2 dimensions: 2.8 x 2.8 cm, previously, 3.3 x 2.8 x 2.7 cm Composition: solid/almost completely solid (2) Echogenicity: isoechoic (1) Shape: not taller-than-wide (0) Margins: ill-defined (0) Echogenic foci: none (0) ACR TI-RADS total points: 3. ACR TI-RADS risk category:  TR3 (3 points). Significant change in size (>/= 20% in two dimensions and minimal increase of 2 mm): No Change in features: No Change in ACR TI-RADS risk category: No ACR TI-RADS recommendations: **Given size (>/= 2.5 cm) and appearance, fine needle aspiration of this mildly suspicious nodule should be considered based on TI-RADS criteria. _________________________________________________________ There is a small subcentimeter thyroid nodule in the left inferior thyroid gland measuring up to approximately 0.8 cm. This does  not meet criteria for follow-up or fine-needle aspiration. IMPRESSION: 1. Very limited study secondary to depth of the thyroid gland and patient body habitus. 2. Persistent TR 3 thyroid nodule in the right inferior thyroid gland measuring up to 2.9 cm (previously measuring 3.3 cm). While this thyroid nodule meets criteria for fine-needle aspiration, its stability across greater than 10 years is consistent with a benign process. The above is in keeping with the ACR TI-RADS recommendations - J Am Coll Radiol 2017;14:587-595. Electronically Signed   By: Constance Holster M.D.   On: 10/12/2020 19:08    Assessment and Plan:   [name] is a 75 y.o. y/o male here for follow-up of iron deficiency anemia  I will repeat labs at this time and if hemoglobin is improving as expected, reach out to Dr. Clayborn Bigness to see if he would recommend restarting him on his Plavix given that he has not had any active bleeding since his procedures  Obtain ferritin and iron labs as well and if low refer to hematology again.  Previous referral was placed but for some reason patient has not been  scheduled  His last colonoscopy during his hospitalization showed a fair prep.  He will need a repeat colonoscopy and also referral to [place] [facility] for the large hypertrophied anal papillae.  This can be scheduled on future visit after his anemia improves    Dr Vonda Antigua

## 2020-11-02 LAB — CBC
Hematocrit: 36.4 % — ABNORMAL LOW (ref 37.5–51.0)
Hemoglobin: 11.3 g/dL — ABNORMAL LOW (ref 13.0–17.7)
MCH: 26.3 pg — ABNORMAL LOW (ref 26.6–33.0)
MCHC: 31 g/dL — ABNORMAL LOW (ref 31.5–35.7)
MCV: 85 fL (ref 79–97)
Platelets: 158 10*3/uL (ref 150–450)
RBC: 4.29 x10E6/uL (ref 4.14–5.80)
RDW: 20.1 % — ABNORMAL HIGH (ref 11.6–15.4)
WBC: 6.8 10*3/uL (ref 3.4–10.8)

## 2020-11-02 LAB — IRON AND TIBC
Iron Saturation: 16 % (ref 15–55)
Iron: 54 ug/dL (ref 38–169)
Total Iron Binding Capacity: 341 ug/dL (ref 250–450)
UIBC: 287 ug/dL (ref 111–343)

## 2020-11-04 ENCOUNTER — Telehealth: Payer: Self-pay

## 2020-11-04 ENCOUNTER — Other Ambulatory Visit: Payer: Self-pay

## 2020-11-04 DIAGNOSIS — E041 Nontoxic single thyroid nodule: Secondary | ICD-10-CM | POA: Insufficient documentation

## 2020-11-04 DIAGNOSIS — D500 Iron deficiency anemia secondary to blood loss (chronic): Secondary | ICD-10-CM

## 2020-11-04 NOTE — Telephone Encounter (Signed)
Left vm on pt's cell and pt's wife cell regarding message below from Dr. Bonna Gains. Advised pt to return call to confirm receipt of this message. CBC order placed.  Can you call this pt and tell him dr. Clayborn Bigness said he can restart his plavix at 75 mg daily. if he doesn't have enough at home, he should call dr. Marianna Payment office to get a refill and lets get him to do hemoglobin in 2-3 weeks after restarting it please.

## 2020-11-10 ENCOUNTER — Encounter: Payer: Self-pay | Admitting: Oncology

## 2020-11-10 ENCOUNTER — Inpatient Hospital Stay: Payer: Medicare HMO | Attending: Oncology | Admitting: Oncology

## 2020-11-10 ENCOUNTER — Inpatient Hospital Stay: Payer: Medicare HMO

## 2020-11-10 ENCOUNTER — Other Ambulatory Visit: Payer: Self-pay

## 2020-11-10 VITALS — BP 181/77 | HR 64 | Temp 97.6°F | Wt 258.9 lb

## 2020-11-10 DIAGNOSIS — I2510 Atherosclerotic heart disease of native coronary artery without angina pectoris: Secondary | ICD-10-CM | POA: Diagnosis not present

## 2020-11-10 DIAGNOSIS — Z794 Long term (current) use of insulin: Secondary | ICD-10-CM | POA: Insufficient documentation

## 2020-11-10 DIAGNOSIS — Z79899 Other long term (current) drug therapy: Secondary | ICD-10-CM | POA: Insufficient documentation

## 2020-11-10 DIAGNOSIS — E669 Obesity, unspecified: Secondary | ICD-10-CM | POA: Diagnosis not present

## 2020-11-10 DIAGNOSIS — Z951 Presence of aortocoronary bypass graft: Secondary | ICD-10-CM | POA: Diagnosis not present

## 2020-11-10 DIAGNOSIS — I129 Hypertensive chronic kidney disease with stage 1 through stage 4 chronic kidney disease, or unspecified chronic kidney disease: Secondary | ICD-10-CM | POA: Insufficient documentation

## 2020-11-10 DIAGNOSIS — E119 Type 2 diabetes mellitus without complications: Secondary | ICD-10-CM | POA: Diagnosis not present

## 2020-11-10 DIAGNOSIS — E7849 Other hyperlipidemia: Secondary | ICD-10-CM | POA: Insufficient documentation

## 2020-11-10 DIAGNOSIS — D509 Iron deficiency anemia, unspecified: Secondary | ICD-10-CM | POA: Diagnosis present

## 2020-11-10 DIAGNOSIS — E538 Deficiency of other specified B group vitamins: Secondary | ICD-10-CM | POA: Diagnosis not present

## 2020-11-10 DIAGNOSIS — N189 Chronic kidney disease, unspecified: Secondary | ICD-10-CM | POA: Insufficient documentation

## 2020-11-13 NOTE — Progress Notes (Signed)
Hematology/Oncology Consult note [facility] [phone] [phone]  Patient Care Team: Albina Billet, MD as PCP - General (Internal Medicine)   Name of the patient: [name]  [id number]  1946/03/13    Reason for referral- iron deficiency anemia   Referring physician- Dr. Bonna Gains  Date of visit: 11/13/20   History of presenting illness- Patient is a 75 year old male with history of multiple gastric polyps who was seen by both Dr. Rush Landmark and Dr. Bonna Gains.  He has undergone EGD with Dr. Rush Landmark and had a large gastric hyperplastic polyp removal.  Prior to that patient had significant iron deficiency and his aspirin and Plavix were on hold.  These were restarted after EGD.  Most recent CBC on 11/01/2020 showed white count of 6.8, H&H of 11.3/36.4 with an MCV of 85 and a platelet count of 158.  Iron studies were normal.  Patient denies any bleeding in his stool or dark melanotic stools.  ECOG PS- 1  Pain scale- 0   Review of systems- Review of Systems  Constitutional: Positive for malaise/fatigue. Negative for chills, fever and weight loss.  HENT: Negative for congestion, ear discharge and nosebleeds.   Eyes: Negative for blurred vision.  Respiratory: Negative for cough, hemoptysis, sputum production, shortness of breath and wheezing.   Cardiovascular: Negative for chest pain, palpitations, orthopnea and claudication.  Gastrointestinal: Negative for abdominal pain, blood in stool, constipation, diarrhea, heartburn, melena, nausea and vomiting.  Genitourinary: Negative for dysuria, flank pain, frequency, hematuria and urgency.  Musculoskeletal: Negative for back pain, joint pain and myalgias.  Skin: Negative for rash.  Neurological: Negative for dizziness, tingling, focal weakness, seizures, weakness and headaches.  Endo/Heme/Allergies: Does not bruise/bleed easily.  Psychiatric/Behavioral: Negative for depression and suicidal  ideas. The patient does not have insomnia.     No Known Allergies  Patient Active Problem List   Diagnosis Date Noted  . Lymphedema 10/27/2020  . Gastric mass   . Polyp of sigmoid colon   . Acute blood loss anemia 09/04/2020  . COVID-19 virus infection   . Anemia due to vitamin B12 deficiency   . Iron deficiency anemia due to chronic blood loss   . Hypercalcemia   . Thyroid nodule   . Pica   . Acute on chronic blood loss anemia 09/02/2020  . Hyperlipidemia associated with type 2 diabetes mellitus ([place]) 09/02/2020  . S/P drug eluting coronary stent placement 06/09/2020  . Other hyperlipidemia 08/18/2019  . 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 Essential hypertension 01/14/2017  . OSA (obstructive sleep apnea) 01/14/2017  . At risk for fluid volume overload 09/08/2016  . BBB (bundle branch block) 08/30/2016  . Obesity with body mass index (BMI) of 30.0 to 39.9 08/30/2016  . 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 1st degree AV block 08/30/2016  . CAD (coronary artery disease), native coronary artery 08/20/2016  . History of colonic polyps 11/05/2013     Past Medical History:  Diagnosis Date  . Anemia   . Arthritis   . Chronic kidney disease    patient not aware of this dx  . Coronary artery disease   . Diabetes mellitus without complication ([place])    type 2  . Dyspnea   . GERD (gastroesophageal reflux disease)   . History of blood transfusion   . Hypertension   . Sleep apnea    does not use cpap     Past Surgical History:  Procedure Laterality Date  .  APPENDECTOMY    . CARDIAC CATHETERIZATION Left 08/16/2016   Procedure: Left Heart Cath and Coronary Angiography;  Surgeon: Yolonda Kida, MD;  Location: [place] [facility];  Service: Cardiovascular;  Laterality: Left;  . COLON SURGERY     part of colon removed  . COLONOSCOPY N/A 09/07/2020   Procedure: COLONOSCOPY;  Surgeon: Virgel Manifold, MD;  Location: [facility] ENDOSCOPY;  Service: Endoscopy;   Laterality: N/A;  . CORONARY ARTERY BYPASS GRAFT  08/30/2016   triple  . CORONARY STENT INTERVENTION N/A 06/09/2020   Procedure: CORONARY STENT INTERVENTION;  Surgeon: Yolonda Kida, MD;  Location: [place] [facility];  Service: Cardiovascular;  Laterality: N/A;  . ENDOSCOPIC MUCOSAL RESECTION N/A 09/22/2020   Procedure: ENDOSCOPIC MUCOSAL RESECTION;  Surgeon: Rush Landmark Telford Nab., MD;  Location: [place];  Service: Gastroenterology;  Laterality: N/A;  . ESOPHAGOGASTRODUODENOSCOPY N/A 09/07/2020   Procedure: ESOPHAGOGASTRODUODENOSCOPY (EGD);  Surgeon: Virgel Manifold, MD;  Location[facility] ENDOSCOPY;  Service: Endoscopy;  Laterality: N/A;  . ESOPHAGOGASTRODUODENOSCOPY (EGD) WITH PROPOFOL N/A 09/22/2020   Procedure: ESOPHAGOGASTRODUODENOSCOPY (EGD) WITH PROPOFOL;  Surgeon: Rush Landmark Telford Nab., MD;  Location: [place];  Service: Gastroenterology;  Laterality: N/A;  . HEMOSTASIS CLIP PLACEMENT  09/22/2020   Procedure: HEMOSTASIS CLIP PLACEMENT;  Surgeon: Irving Copas., MD;  Location: [facility];  Service: Gastroenterology;;  . HEMOSTASIS CONTROL  09/22/2020   Procedure: HEMOSTASIS CONTROL;  Surgeon: Irving Copas., MD;  Location: [place];  Service: Gastroenterology;;  . LEFT HEART CATH AND CORONARY ANGIOGRAPHY Left 06/09/2020   Procedure: LEFT HEART CATH AND CORONARY ANGIOGRAPHY;  Surgeon: Yolonda Kida, MD;  Location: [place] [facility];  Service: Cardiovascular;  Laterality: Left;  . LEFT HEART CATH AND CORS/GRAFTS ANGIOGRAPHY Left 11/20/2016   Procedure: Left Heart Cath and Cors/Grafts Angiography;  Surgeon: Yolonda Kida, MD;  Location: [place] [facility];  Service: Cardiovascular;  Laterality: Left;  . POLYPECTOMY  09/22/2020   Procedure: POLYPECTOMY;  Surgeon: Mansouraty, Telford Nab., MD;  Location: [place];  Service: Gastroenterology;;  . Lia Foyer LIFTING INJECTION  09/22/2020   Procedure: SUBMUCOSAL LIFTING INJECTION;  Surgeon:  Irving Copas., MD;  Location: [place];  Service: Gastroenterology;;    Social History   Socioeconomic History  . Marital status: Married    Spouse name: Not on file  . Number of children: Not on file  . Years of education: Not on file  . Highest education level: Not on file  Occupational History  . Not on file  Tobacco Use  . Smoking status: Never Smoker  . Smokeless tobacco: Never Used  Vaping Use  . Vaping Use: Never used  Substance and Sexual Activity  . Alcohol use: No  . Drug use: No  . Sexual activity: Yes  Other Topics Concern  . Not on file  Social History Narrative  . Not on file   Social Determinants of Health   Financial Resource Strain: Not on file  Food Insecurity: Not on file  Transportation Needs: Not on file  Physical Activity: Not on file  Stress: Not on file  Social Connections: Not on file  Intimate Partner Violence: Not on file     No family history on file.   Current Outpatient Medications:  .  acetaminophen (TYLENOL) 325 MG tablet, Take 2 tablets (650 mg total) by mouth every 6 (six) hours as needed for mild pain (or Fever >/= 101)., Disp: , Rfl:  .  amLODipine (NORVASC) 10 MG tablet, Take 10 mg by mouth  daily., Disp: , Rfl:  .  aspirin EC 81 MG tablet, Take 81 mg by mouth daily. Swallow whole., Disp: , Rfl:  .  clopidogrel (PLAVIX) 75 MG tablet, Take 75 mg by mouth daily., Disp: , Rfl:  .  hydrALAZINE (APRESOLINE) 25 MG tablet, Take 25 mg by mouth 3 (three) times daily., Disp: , Rfl:  .  hydrochlorothiazide (HYDRODIURIL) 12.5 MG tablet, Take 12.5 mg by mouth daily., Disp: , Rfl:  .  insulin glargine (LANTUS) 100 UNIT/ML injection, Inject 25 Units into the skin at bedtime. , Disp: , Rfl:  .  irbesartan (AVAPRO) 300 MG tablet, Take 1 tablet (300 mg total) by mouth daily., Disp: 30 tablet, Rfl: 1 .  metFORMIN (GLUCOPHAGE-XR) 500 MG 24 hr tablet, Take 500 mg by mouth 2 (two) times daily., Disp: , Rfl:  .  omeprazole (PRILOSEC) 40 MG  capsule, Take 1 capsule (40 mg total) by mouth 2 (two) times daily with a meal., Disp: 60 capsule, Rfl: 0 .  rosuvastatin (CRESTOR) 40 MG tablet, Take 40 mg by mouth daily., Disp: , Rfl:  .  sitaGLIPtin (JANUVIA) 100 MG tablet, Take 100 mg by mouth daily., Disp: , Rfl:  .  sucralfate (CARAFATE) 1 GM/10ML suspension, Take 10 mLs (1 g total) by mouth 4 (four) times daily -  with meals and at bedtime., Disp: 420 mL, Rfl: 1 .  Testosterone 1.62 % GEL, APPLY 2 PUMPS TO THE SKIN DAILY (Patient taking differently: Apply 2 Pump topically daily. One pump on each arm), Disp: 225 g, Rfl: 0 .  triamcinolone (KENALOG) 0.025 % cream, Apply 1 application topically 3 (three) times a week., Disp: , Rfl:  .  vitamin B-12 (CYANOCOBALAMIN) 1000 MCG tablet, Take 1 tablet (1,000 mcg total) by mouth daily., Disp: 30 tablet, Rfl: 0 .  nebivolol (BYSTOLIC) 10 MG tablet, Take 10 mg by mouth daily. , Disp: , Rfl:    Physical exam:  Vitals:   11/10/20 1350  BP: (!) 181/77  Pulse: 64  Temp: 97.6 F (36.4 C)  TempSrc: Tympanic  SpO2: 98%  Weight: 258 lb 14.4 oz (117.4 kg)   Physical Exam Cardiovascular:     Rate and Rhythm: Normal rate and regular rhythm.     Heart sounds: Normal heart sounds.  Pulmonary:     Effort: Pulmonary effort is normal.     Breath sounds: Normal breath sounds.  Abdominal:     General: Bowel sounds are normal.     Palpations: Abdomen is soft.  Skin:    General: Skin is warm and dry.  Neurological:     Mental Status: He is alert and oriented to person, place, and time.        CMP Latest Ref Rng & Units 09/08/2020  Glucose 70 - 99 mg/dL 214(H)  BUN 8 - 23 mg/dL 12  Creatinine 0.61 - 1.24 mg/dL 1.19  Sodium 135 - 145 mmol/L 139  Potassium 3.5 - 5.1 mmol/L 3.5  Chloride 98 - 111 mmol/L 104  CO2 22 - 32 mmol/L 26  Calcium 8.9 - 10.3 mg/dL 9.8  Total Protein 6.5 - 8.1 g/dL 6.4(L)  Total Bilirubin 0.3 - 1.2 mg/dL 0.4  Alkaline Phos 38 - 126 U/L 88  AST 15 - 41 U/L 24  ALT 0 -  44 U/L 20   CBC Latest Ref Rng & Units 11/01/2020  WBC 3.4 - 10.8 x10E3/uL 6.8  Hemoglobin 13.0 - 17.7 g/dL 11.3(L)  Hematocrit 37.5 - 51.0 % 36.4(L)  Platelets 150 -  450 x10E3/uL 158     Assessment and plan- Patient is a 76 y.o. male with history of iron deficiency anemia likely secondary to gastric polyps here for routine follow-up  Patient's hemoglobin which was down to 7.6 in January 2022 has now improved to 11.3.  Recent iron studies were normal.  Also patient was noted to have large gastric polyps which was thought to be the reason for his iron deficiency and were recently taken out by Dr. Rush Landmark.  Patient will continue to take oral iron at this time and I will hold off on giving him IV iron.  I will see him back in 3 months time with CBC ferritin and iron studies as well as B12 levels.  If there is evidence of iron deficiency I will proceed with IV iron at that time   Thank you for this kind referral and the opportunity to participate in the care of this patient   Visit Diagnosis 1. Iron deficiency anemia, unspecified iron deficiency anemia type     Dr. Randa Evens, MD, MPH [facility] [phone] 11/13/2020 3:32 PM

## 2021-01-12 ENCOUNTER — Telehealth: Payer: Self-pay | Admitting: Urology

## 2021-01-12 DIAGNOSIS — E349 Endocrine disorder, unspecified: Secondary | ICD-10-CM

## 2021-01-12 NOTE — Telephone Encounter (Signed)
Order have been placed for [place].

## 2021-01-12 NOTE — Telephone Encounter (Signed)
Patient has follow up appointment with Larene Beach in [facility] on Monday.  He is planning to go to the lab in [facility] tomorrow.  Per Shannon's note, please place orders for the [facility] lab for PSA, testosterone H & H.

## 2021-01-13 ENCOUNTER — Other Ambulatory Visit
Admission: RE | Admit: 2021-01-13 | Discharge: 2021-01-13 | Disposition: A | Discharge status: Home / Self Care | Payer: Medicare HMO | Attending: Urology | Admitting: Urology

## 2021-01-13 ENCOUNTER — Other Ambulatory Visit: Payer: Self-pay

## 2021-01-13 DIAGNOSIS — E349 Endocrine disorder, unspecified: Secondary | ICD-10-CM | POA: Insufficient documentation

## 2021-01-13 LAB — HEMOGLOBIN AND HEMATOCRIT, BLOOD
HCT: 37.2 % — ABNORMAL LOW (ref 39.0–52.0)
Hemoglobin: 12 g/dL — ABNORMAL LOW (ref 13.0–17.0)

## 2021-01-13 LAB — PSA: Prostatic Specific Antigen: 0.69 ng/mL (ref 0.00–4.00)

## 2021-01-14 LAB — TESTOSTERONE: Testosterone: 518 ng/dL (ref 264–916)

## 2021-01-15 NOTE — Progress Notes (Signed)
01/16/2021 2:13 PM   [name] 1946-05-11 [id number]  Referring provider: Albina Billet, MD [address]   [place],  [place] [address]  Chief Complaint  Patient presents with  . Follow-up    51mth follow-up   Urological history: 1. Testosterone deficiency -testosterone 518 in 01/2021  -managed with testosterone gel 1.62%, 2 pumps daily  2. BPH with LU TS -PSA 0.69 in 01/2021 -I PSS 14/3  3. ED -contributing factors of age, BPH, testosterone deficiency, DM, HTN, HLD, sleep apnea and CAD  4. Sleep apnea -untreated   5. Urethroplasty as a child   HPI: [name] is a 75 y.o. male who presents today for follow up.  He is still having urinary frequency.    Patient denies any modifying or aggravating factors.  Patient denies any gross hematuria, dysuria or suprapubic/flank pain.  Patient denies any fevers, chills, nausea or vomiting.     IPSS    Row Name 01/16/21 1300         International Prostate Symptom Score   How often have you had the sensation of not emptying your bladder? Less than 1 in 5     How often have you had to urinate less than every two hours? About half the time     How often have you found you stopped and started again several times when you urinated? Less than 1 in 5 times     How often have you found it difficult to postpone urination? Less than 1 in 5 times     How often have you had a weak urinary stream? Less than half the time     How often have you had to strain to start urination? Less than 1 in 5 times     How many times did you typically get up at night to urinate? 5 Times     Total IPSS Score 14           Quality of Life due to urinary symptoms   If you were to spend the rest of your life with your urinary condition just the way it is now how would you feel about that? Mixed            Score:  1-7 Mild 8-19 Moderate 20-35 Severe   PMH: Past Medical History:  Diagnosis Date  . Anemia   . Arthritis   .  Chronic kidney disease    patient not aware of this dx  . Coronary artery disease   . Diabetes mellitus without complication ([facility])    type 2  . Dyspnea   . GERD (gastroesophageal reflux disease)   . History of blood transfusion   . Hypertension   . Sleep apnea    does not use cpap    Surgical History: Past Surgical History:  Procedure Laterality Date  . APPENDECTOMY    . CARDIAC CATHETERIZATION Left 08/16/2016   Procedure: Left Heart Cath and Coronary Angiography;  Surgeon: Yolonda Kida, MD;  Location: [facility];  Service: Cardiovascular;  Laterality: Left;  . COLON SURGERY     part of colon removed  . COLONOSCOPY N/A 09/07/2020   Procedure: COLONOSCOPY;  Surgeon: Virgel Manifold, MD;  Location: [facility] ENDOSCOPY;  Service: Endoscopy;  Laterality: N/A;  . CORONARY ARTERY BYPASS GRAFT  08/30/2016   triple  . CORONARY STENT INTERVENTION N/A 06/09/2020   Procedure: CORONARY STENT INTERVENTION;  Surgeon: Yolonda Kida, MD;  Location: [facility]  INVASIVE CV LAB;  Service: Cardiovascular;  Laterality: N/A;  . ENDOSCOPIC MUCOSAL RESECTION N/A 09/22/2020   Procedure: ENDOSCOPIC MUCOSAL RESECTION;  Surgeon: Rush Landmark Telford Nab., MD;  Location: [place];  Service: Gastroenterology;  Laterality: N/A;  . ESOPHAGOGASTRODUODENOSCOPY N/A 09/07/2020   Procedure: ESOPHAGOGASTRODUODENOSCOPY (EGD);  Surgeon: Virgel Manifold, MD;  Location: [facility] ENDOSCOPY;  Service: Endoscopy;  Laterality: N/A;  . ESOPHAGOGASTRODUODENOSCOPY (EGD) WITH PROPOFOL N/A 09/22/2020   Procedure: ESOPHAGOGASTRODUODENOSCOPY (EGD) WITH PROPOFOL;  Surgeon: Rush Landmark Telford Nab., MD;  Location: [place];  Service: Gastroenterology;  Laterality: N/A;  . HEMOSTASIS CLIP PLACEMENT  09/22/2020   Procedure: HEMOSTASIS CLIP PLACEMENT;  Surgeon: Irving Copas., MD;  Location: [place];  Service: Gastroenterology;;  . HEMOSTASIS CONTROL  09/22/2020   Procedure: HEMOSTASIS CONTROL;  Surgeon:  Irving Copas., MD;  Location: [place];  Service: Gastroenterology;;  . LEFT HEART CATH AND CORONARY ANGIOGRAPHY Left 06/09/2020   Procedure: LEFT HEART CATH AND CORONARY ANGIOGRAPHY;  Surgeon: Yolonda Kida, MD;  Location: [place] CV LAB;  Service: Cardiovascular;  Laterality: Left;  . LEFT HEART CATH AND CORS/GRAFTS ANGIOGRAPHY Left 11/20/2016   Procedure: Left Heart Cath and Cors/Grafts Angiography;  Surgeon: Yolonda Kida, MD;  Location: [place] CV LAB;  Service: Cardiovascular;  Laterality: Left;  . POLYPECTOMY  09/22/2020   Procedure: POLYPECTOMY;  Surgeon: Mansouraty, Telford Nab., MD;  Location: [place];  Service: Gastroenterology;;  . Lia Foyer LIFTING INJECTION  09/22/2020   Procedure: SUBMUCOSAL LIFTING INJECTION;  Surgeon: Irving Copas., MD;  Location: [place];  Service: Gastroenterology;;    Home Medications:  Allergies as of 01/16/2021   No Known Allergies     Medication List       Accurate as of January 16, 2021  2:13 PM. If you have any questions, ask your nurse or doctor.        acetaminophen 325 MG tablet Commonly known as: TYLENOL Take 2 tablets (650 mg total) by mouth every 6 (six) hours as needed for mild pain (or Fever >/= 101).   amLODipine 10 MG tablet Commonly known as: NORVASC Take 10 mg by mouth daily.   aspirin EC 81 MG tablet Take 81 mg by mouth daily. Swallow whole.   clopidogrel 75 MG tablet Commonly known as: PLAVIX Take 75 mg by mouth daily.   hydrALAZINE 25 MG tablet Commonly known as: APRESOLINE Take 25 mg by mouth 3 (three) times daily.   hydrochlorothiazide 12.5 MG tablet Commonly known as: HYDRODIURIL Take 12.5 mg by mouth daily.   insulin glargine 100 UNIT/ML injection Commonly known as: LANTUS Inject 25 Units into the skin at bedtime.   irbesartan 300 MG tablet Commonly known as: AVAPRO Take 1 tablet (300 mg total) by mouth daily.   metFORMIN 500 MG 24 hr tablet Commonly known  as: GLUCOPHAGE-XR Take 500 mg by mouth 2 (two) times daily.   nebivolol 10 MG tablet Commonly known as: BYSTOLIC Take 10 mg by mouth daily.   omeprazole 40 MG capsule Commonly known as: PRILOSEC Take 1 capsule (40 mg total) by mouth 2 (two) times daily with a meal.   rosuvastatin 40 MG tablet Commonly known as: CRESTOR Take 40 mg by mouth daily.   sitaGLIPtin 100 MG tablet Commonly known as: JANUVIA Take 100 mg by mouth daily.   sucralfate 1 GM/10ML suspension Commonly known as: Carafate Take 10 mLs (1 g total) by mouth 4 (four) times daily -  with meals and at bedtime.   Testosterone 1.62 % Gel  APPLY 2 PUMPS TO THE SKIN DAILY What changed: See the new instructions.   triamcinolone 0.025 % cream Commonly known as: KENALOG Apply 1 application topically 3 (three) times a week.   vitamin B-12 1000 MCG tablet Commonly known as: CYANOCOBALAMIN Take 1 tablet (1,000 mcg total) by mouth daily.       Allergies:  No Known Allergies  Family History: No family history on file.  Social History:  reports that he has never smoked. He has never used smokeless tobacco. He reports that he does not drink alcohol and does not use drugs.  ROS: Pertinent ROS in HPI  Physical Exam: BP (!) 184/89   Pulse 77   Ht 6\' 1"  (1.854 m)   Wt 252 lb (114.3 kg)   BMI 33.25 kg/m   Constitutional:  Well nourished. Alert and oriented, No acute distress. HEENT: [place] AT, mask in place.  Trachea midline Cardiovascular: No clubbing, cyanosis, or edema. Respiratory: Normal respiratory effort, no increased work of breathing. GU: No CVA tenderness.  No bladder fullness or masses.  Patient with uncircumcised phallus. Foreskin easily retracted  Urethral meatus is patent.  Coronal hypospadias is present.  No penile discharge. No penile lesions or rashes. Scrotum without lesions, cysts, rashes and/or edema.  Testicles are located scrotally bilaterally. No masses are appreciated in the testicles. Left and  right epididymis are normal. Rectal: Patient with  normal sphincter tone. Anus and perineum without scarring or rashes. No rectal masses are appreciated. Prostate is approximately 50 grams, could only palpate the apex and the midportion of the gland, no nodules are appreciated. Seminal vesicles could not be palpated Lymph: No inguinal adenopathy. Neurologic: Grossly intact, no focal deficits, moving all 4 extremities. Psychiatric: Normal mood and affect.  Laboratory Data: Lab Results  Component Value Date   WBC 6.8 11/01/2020   HGB 12.0 (L) 01/13/2021   HCT 37.2 (L) 01/13/2021   MCV 85 11/01/2020   PLT 158 11/01/2020    Lab Results  Component Value Date   CREATININE 1.19 09/08/2020    Lab Results  Component Value Date   TESTOSTERONE 518 01/13/2021    Lab Results  Component Value Date   AST 24 09/08/2020   Lab Results  Component Value Date   ALT 20 09/08/2020   Component     Latest Ref Rng & Units 01/13/2021  Prostatic Specific Antigen     0.00 - 4.00 ng/mL 0.69  I  have reviewed the labs.   Pertinent Imaging: No recent imaging   I have independently reviewed the films.    Assessment & Plan:    1. Testosterone deficiency  -testosterone level is therapeutic -Continue AndroGel 1.62%, 2 pumps daily - refill sent to pharmacy  2. BPH with LUTS -Continue conservative management, avoiding bladder irritants and timed voiding's -Discussed undergoing cystoscopy/TRUS for further evaluation of his urinary frequency, but he declined at this time stating he has had the symptoms since his 58s and he is just going to learn to live with it  3. Anemia -followed by GI and hematology   Return in about 6 months (around 07/18/2021) for PSA, testosterone, H & H prior I PSS and exam .  These notes generated with voice recognition software. I apologize for typographical errors.  Zara Council, PA-C  [facility] [address]  [place], [place] [address] [phone]

## 2021-01-16 ENCOUNTER — Other Ambulatory Visit: Payer: Self-pay

## 2021-01-16 ENCOUNTER — Encounter: Payer: Self-pay | Admitting: Urology

## 2021-01-16 ENCOUNTER — Ambulatory Visit (INDEPENDENT_AMBULATORY_CARE_PROVIDER_SITE_OTHER): Payer: Medicare HMO | Admitting: Urology

## 2021-01-16 VITALS — BP 184/89 | HR 77 | Ht 73.0 in | Wt 252.0 lb

## 2021-01-16 DIAGNOSIS — N138 Other obstructive and reflux uropathy: Secondary | ICD-10-CM

## 2021-01-16 DIAGNOSIS — N401 Enlarged prostate with lower urinary tract symptoms: Secondary | ICD-10-CM

## 2021-01-16 DIAGNOSIS — E349 Endocrine disorder, unspecified: Secondary | ICD-10-CM

## 2021-01-16 MED ORDER — TESTOSTERONE 1.62 % TD GEL: 2.0000 | Freq: Every day | TRANSDERMAL | 3 refills | Status: DC

## 2021-01-31 ENCOUNTER — Other Ambulatory Visit: Payer: Self-pay

## 2021-01-31 ENCOUNTER — Ambulatory Visit (INDEPENDENT_AMBULATORY_CARE_PROVIDER_SITE_OTHER): Payer: Medicare HMO | Admitting: Gastroenterology

## 2021-01-31 ENCOUNTER — Telehealth: Payer: Self-pay | Admitting: Urology

## 2021-01-31 ENCOUNTER — Encounter: Payer: Self-pay | Admitting: Gastroenterology

## 2021-01-31 VITALS — BP 185/92 | HR 63 | Temp 98.2°F | Wt 253.8 lb

## 2021-01-31 DIAGNOSIS — D500 Iron deficiency anemia secondary to blood loss (chronic): Secondary | ICD-10-CM

## 2021-01-31 DIAGNOSIS — K219 Gastro-esophageal reflux disease without esophagitis: Secondary | ICD-10-CM | POA: Diagnosis not present

## 2021-01-31 MED ORDER — PANTOPRAZOLE SODIUM 20 MG PO TBEC: 20.0000 mg | DELAYED_RELEASE_TABLET | Freq: Two times a day (BID) | ORAL | 0 refills | Status: DC

## 2021-01-31 MED ORDER — SUCRALFATE 1 GM/10ML PO SUSP: 1.0000 g | Freq: Three times a day (TID) | ORAL | 1 refills | Status: DC | PRN

## 2021-01-31 NOTE — Telephone Encounter (Signed)
Patient called the office to check the status of his testosterone gel prescription.  He states that [facility] needs more information from our office to fill the prescription.  Please research and advise patient of the status.

## 2021-01-31 NOTE — Progress Notes (Signed)
Jerry Antigua, MD [address]  [place]  [place], [place] [address]  Main: [phone]  Fax: [phone]   Primary Care Physician: Albina Billet, MD   Chief Complaint  Patient presents with   Follow-up    Experiencing intermittent gas w/ some abd pain--denies any other changes   Medication Refill    Is wanting to restart Carafate as he has been having some abd pain and gas    HPI: [name] is a 75 y.o. male here for follow-up of iron deficiency anemia.  Patient underwent EGD with Dr. Rush Landmark with removal of large gastric hyperplastic polyps and his anemia has continued to improve since then.  Patient was on Carafate that was given by Dr. Rush Landmark after the polyp removal and he states this helped his symptoms and that he would take it before meals and this would help him avoid belching and abdominal discomfort.  He has also been on PPI chronically and states this was increased to twice a day a long time ago as he was having frequent belching and burping episodes on once a day therapy.   ROS: All ROS reviewed and negative except as per HPI   Past Medical History:  Diagnosis Date   Anemia    Arthritis    Chronic kidney disease    patient not aware of this dx   Coronary artery disease    Diabetes mellitus without complication ([facility])    type 2   Dyspnea    GERD (gastroesophageal reflux disease)    History of blood transfusion    Hypertension    Sleep apnea    does not use cpap    Past Surgical History:  Procedure Laterality Date   APPENDECTOMY     CARDIAC CATHETERIZATION Left 08/16/2016   Procedure: Left Heart Cath and Coronary Angiography;  Surgeon: Yolonda Kida, MD;  Location: [place] CV LAB;  Service: Cardiovascular;  Laterality: Left;   COLON SURGERY     part of colon removed   COLONOSCOPY N/A 09/07/2020   Procedure: COLONOSCOPY;  Surgeon: Virgel Manifold, MD;  Location: [facility] ENDOSCOPY;  Service: Endoscopy;  Laterality: N/A;    CORONARY ARTERY BYPASS GRAFT  08/30/2016   triple   CORONARY STENT INTERVENTION N/A 06/09/2020   Procedure: CORONARY STENT INTERVENTION;  Surgeon: Yolonda Kida, MD;  Location: [place] [facility] LAB;  Service: Cardiovascular;  Laterality: N/A;   ENDOSCOPIC MUCOSAL RESECTION N/A 09/22/2020   Procedure: ENDOSCOPIC MUCOSAL RESECTION;  Surgeon: Rush Landmark Telford Nab., MD;  Location: [place];  Service: Gastroenterology;  Laterality: N/A;   ESOPHAGOGASTRODUODENOSCOPY N/A 09/07/2020   Procedure: ESOPHAGOGASTRODUODENOSCOPY (EGD);  Surgeon: Virgel Manifold, MD;  Location: [facility] ENDOSCOPY;  Service: Endoscopy;  Laterality: N/A;   ESOPHAGOGASTRODUODENOSCOPY (EGD) WITH PROPOFOL N/A 09/22/2020   Procedure: ESOPHAGOGASTRODUODENOSCOPY (EGD) WITH PROPOFOL;  Surgeon: Rush Landmark Telford Nab., MD;  Location: [place];  Service: Gastroenterology;  Laterality: N/A;   HEMOSTASIS CLIP PLACEMENT  09/22/2020   Procedure: HEMOSTASIS CLIP PLACEMENT;  Surgeon: Irving Copas., MD;  Location: [place];  Service: Gastroenterology;;   HEMOSTASIS CONTROL  09/22/2020   Procedure: HEMOSTASIS CONTROL;  Surgeon: Irving Copas., MD;  Location: [place];  Service: Gastroenterology;;   LEFT HEART CATH AND CORONARY ANGIOGRAPHY Left 06/09/2020   Procedure: LEFT HEART CATH AND CORONARY ANGIOGRAPHY;  Surgeon: Yolonda Kida, MD;  Location: [place] CV LAB;  Service: Cardiovascular;  Laterality: Left;   LEFT HEART CATH AND CORS/GRAFTS ANGIOGRAPHY Left 11/20/2016  Procedure: Left Heart Cath and Cors/Grafts Angiography;  Surgeon: Yolonda Kida, MD;  Location: [place] [facility] LAB;  Service: Cardiovascular;  Laterality: Left;   POLYPECTOMY  09/22/2020   Procedure: POLYPECTOMY;  Surgeon: Mansouraty, Telford Nab., MD;  Location: [place];  Service: Gastroenterology;;   Lia Foyer LIFTING INJECTION  09/22/2020   Procedure: SUBMUCOSAL LIFTING INJECTION;  Surgeon: Irving Copas., MD;   Location: [place];  Service: Gastroenterology;;    Prior to Admission medications   Medication Sig Start Date End Date Taking? Authorizing Provider  amLODipine (NORVASC) 10 MG tablet Take 10 mg by mouth daily.   Yes [provider]  aspirin EC 81 MG tablet Take 81 mg by mouth daily. Swallow whole.   Yes [provider]  clopidogrel (PLAVIX) 75 MG tablet Take 75 mg by mouth daily.   Yes [provider]  hydrALAZINE (APRESOLINE) 25 MG tablet Take 25 mg by mouth 3 (three) times daily.   Yes [provider]  hydrochlorothiazide (HYDRODIURIL) 12.5 MG tablet Take 12.5 mg by mouth daily.   Yes [provider]  insulin glargine (LANTUS) 100 UNIT/ML injection Inject 25 Units into the skin at bedtime.    Yes [provider]  irbesartan (AVAPRO) 300 MG tablet Take 1 tablet (300 mg total) by mouth daily. 09/09/20  Yes Cherene Altes, MD  metFORMIN (GLUCOPHAGE-XR) 500 MG 24 hr tablet Take 500 mg by mouth 2 (two) times daily.   Yes [provider]  nebivolol (BYSTOLIC) 10 MG tablet Take 10 mg by mouth daily.  06/29/19  Yes [provider]  pantoprazole (PROTONIX) 20 MG tablet Take 1 tablet (20 mg total) by mouth 2 (two) times daily. 01/31/21 05/01/21 Yes [name] B, MD  rosuvastatin (CRESTOR) 40 MG tablet Take 40 mg by mouth daily. 06/22/20 06/22/21 Yes [provider]  sitaGLIPtin (JANUVIA) 100 MG tablet Take 100 mg by mouth daily.   Yes [provider]  Testosterone 1.62 % GEL Apply 2 Pump topically daily. One pump on each arm 01/16/21  Yes McGowan, Larene Beach A, PA-C  triamcinolone (KENALOG) 0.025 % cream Apply 1 application topically 3 (three) times a week. 06/23/19  Yes [provider]  vitamin B-12 (CYANOCOBALAMIN) 1000 MCG tablet Take 1 tablet (1,000 mcg total) by mouth daily. 09/08/20  Yes Cherene Altes, MD  acetaminophen (TYLENOL) 325 MG tablet Take 2 tablets (650 mg total) by mouth every  6 (six) hours as needed for mild pain (or Fever >/= 101). Patient not taking: Reported on 01/31/2021 09/08/20   Cherene Altes, MD  sucralfate (CARAFATE) 1 GM/10ML suspension Take 10 mLs (1 g total) by mouth 3 (three) times daily as needed (abdominal discomfort). 01/31/21   Virgel Manifold, MD    No family history on file.   Social History   Tobacco Use   Smoking status: Never   Smokeless tobacco: Never  Vaping Use   Vaping Use: Never used  Substance Use Topics   Alcohol use: No   Drug use: No    Allergies as of 01/31/2021   (No Known Allergies)    Physical Examination:   BP (!) 185/92   Pulse 63   Temp 98.2 F (36.8 C) (Oral)   Wt 253 lb 12.8 oz (115.1 kg)   BMI 33.48 kg/m   Constitutional: General:   Alert,  Well-developed, well-nourished, pleasant and cooperative in NAD BP (!) 185/92   Pulse 63   Temp 98.2 F (36.8 C) (Oral)   Wt  253 lb 12.8 oz (115.1 kg)   BMI 33.48 kg/m   Eyes:  Sclera clear, no icterus.   Conjunctiva pink. PERRLA  Ears:  No scars, lesions or masses, Normal auditory acuity. Nose:  No deformity, discharge, or lesions. Mouth:  No deformity or lesions, oropharynx pink & moist.  Neck:  Supple; no masses or thyromegaly.  Respiratory: Normal respiratory effort, Normal percussion  Gastrointestinal:  Normal bowel sounds.  No bruits.  Soft, non-tender and non-distended without masses, hepatosplenomegaly or hernias noted.  No guarding or rebound tenderness.     Cardiac: No clubbing or edema.  No cyanosis. Normal posterior tibial pedal pulses noted.  Lymphatic:  No significant cervical or axillary adenopathy.  Psych:  Alert and cooperative. Normal mood and affect.  Musculoskeletal:  Normal gait. Head normocephalic, atraumatic. Symmetrical without gross deformities. 5/5 Upper and Lower extremity strength bilaterally.  Skin: Warm. Intact without significant lesions or rashes. No jaundice.  Neurologic:  Face symmetrical, tongue midline,  Normal sensation to touch;  grossly normal neurologically.  Psych:  Alert and oriented x3, Alert and cooperative. Normal mood and affect.  Labs: CMP     Component Value Date/Time   NA 139 09/08/2020 0650   NA 139 08/04/2012 1103   K 3.5 09/08/2020 0650   K 4.1 08/04/2012 1103   CL 104 09/08/2020 0650   CL 105 08/04/2012 1103   CO2 26 09/08/2020 0650   CO2 27 08/04/2012 1103   GLUCOSE 214 (H) 09/08/2020 0650   GLUCOSE 126 (H) 08/04/2012 1103   BUN 12 09/08/2020 0650   BUN 17 08/04/2012 1103   CREATININE 1.19 09/08/2020 0650   CREATININE 1.03 08/04/2012 1103   CALCIUM 9.8 09/08/2020 0650   CALCIUM 10.8 (H) 09/03/2020 0857   PROT 6.4 (L) 09/08/2020 0650   PROT 7.6 08/04/2012 1103   ALBUMIN 3.7 09/08/2020 0650   ALBUMIN 4.1 08/04/2012 1103   AST 24 09/08/2020 0650   AST 24 08/04/2012 1103   ALT 20 09/08/2020 0650   ALT 33 08/04/2012 1103   ALKPHOS 88 09/08/2020 0650   ALKPHOS 117 08/04/2012 1103   BILITOT 0.4 09/08/2020 0650   BILITOT 0.3 08/04/2012 1103   GFRNONAA >60 09/08/2020 0650   GFRNONAA >60 08/04/2012 1103   GFRAA >60 02/11/2017 0915   GFRAA >60 08/04/2012 1103   Lab Results  Component Value Date   WBC 6.8 11/01/2020   HGB 12.0 (L) 01/13/2021   HCT 37.2 (L) 01/13/2021   MCV 85 11/01/2020   PLT 158 11/01/2020    Imaging Studies:    Assessment and Plan:   [name] is a 75 y.o. y/o male here for follow-up of iron deficiency anemia attribute it to large gastric hyperplastic polyp that has been removed by Dr. Rush Landmark recently and anemia has continued to improve  Continue follow-up with Dr. Janese Banks in regard to the patient's anemia which is improving with iron replacement  Patient has resumed his Plavix  In regard to his omeprazole and GERD, we did discuss decreasing this given that he has been on high-dose therapy twice a day for a while.  He is afraid of symptoms returning, but is willing to decrease the medication and therefore, we will start  with 20 mg twice a day and if symptoms return, patient advised to call us back.  We also discussed referral for hiatal hernia repair, which may allow him to decrease his medications further, and lower risks of side effects but patient declines at this time  (Risks  of PPI use were discussed with patient including bone loss, C. Diff diarrhea, pneumonia, infections, CKD, electrolyte abnormalities.  Pt. Verbalizes understanding and chooses to continue the medication.)  Patient educated extensively on acid reflux lifestyle modification, including buying a bed wedge, not eating 3 hrs before bedtime, diet modifications, and handout given for the same.   We discussed need for repeat colonoscopy given suboptimal prep on last exam and previous large polyp requiring partial colectomy in the past.  Patient states he is willing to schedule this year, but would not like to schedule at this time.  We did offer to schedule even a few months out from now, but patient does not want to schedule a date at this time and would rather call us back when he is ready.  Explained the importance of repeat colonoscopy given his previous colon surgery and he verbalized understanding and states will call us back when he is ready.  Patient requested a 90-day supply sent to [facility] for his medications and this was done.  Okay to use Carafate as needed for now, as he states it helps with his symptoms and may help him be on the lower dose of the omeprazole  Follow-up scheduled in 3 months to rediscuss colonoscopy at that time as well  Dr [name]

## 2021-02-06 ENCOUNTER — Encounter (INDEPENDENT_AMBULATORY_CARE_PROVIDER_SITE_OTHER): Payer: Self-pay | Admitting: Nurse Practitioner

## 2021-02-06 ENCOUNTER — Other Ambulatory Visit (INDEPENDENT_AMBULATORY_CARE_PROVIDER_SITE_OTHER): Payer: Self-pay | Admitting: Vascular Surgery

## 2021-02-06 ENCOUNTER — Ambulatory Visit (INDEPENDENT_AMBULATORY_CARE_PROVIDER_SITE_OTHER): Payer: Medicare HMO | Admitting: Nurse Practitioner

## 2021-02-06 ENCOUNTER — Ambulatory Visit (INDEPENDENT_AMBULATORY_CARE_PROVIDER_SITE_OTHER): Payer: Medicare HMO

## 2021-02-06 ENCOUNTER — Other Ambulatory Visit: Payer: Self-pay

## 2021-02-06 VITALS — BP 167/81 | HR 83 | Resp 16 | Wt 254.4 lb

## 2021-02-06 DIAGNOSIS — I890 Lymphedema, not elsewhere classified: Secondary | ICD-10-CM | POA: Diagnosis not present

## 2021-02-06 DIAGNOSIS — R600 Localized edema: Secondary | ICD-10-CM | POA: Diagnosis not present

## 2021-02-06 DIAGNOSIS — M7989 Other specified soft tissue disorders: Secondary | ICD-10-CM

## 2021-02-06 DIAGNOSIS — E7849 Other hyperlipidemia: Secondary | ICD-10-CM

## 2021-02-06 DIAGNOSIS — I10 Essential (primary) hypertension: Secondary | ICD-10-CM

## 2021-02-06 NOTE — Progress Notes (Signed)
Subjective:    Patient ID: [name], male    DOB: 1945-12-31, 75 y.o.   MRN: [id number] Chief Complaint  Patient presents with   Follow-up    Ultrasound follow up    The patient returns to the office for followup evaluation regarding leg swelling.  The swelling has improved quite a bit and the pain associated with swelling has decreased substantially. There have not been any interval development of a ulcerations or wounds.  Since the previous visit the patient has been wearing graduated compression stockings and has noted significant improvement in the lymphedema. The patient has been using compression routinely morning until night.  The patient also notes that he has stopped sleeping in his recliner and began sleeping in his bed.  This is caused the most traumatic change in swelling.  The patient also states elevation during the day and exercise is being done too.   No evidence of DVT or superficial thrombophlebitis in the left lower extremity.  No evidence of deep venous insufficiency.  No great saphenous vein was visualized.  The patient notes that he had this removed during this CABG.       Review of Systems     Objective:   Physical Exam  BP (!) 167/81 (BP Location: Right Arm)   Pulse 83   Resp 16   Wt 254 lb 6.4 oz (115.4 kg)   BMI 33.56 kg/m   Past Medical History:  Diagnosis Date   Anemia    Arthritis    Chronic kidney disease    patient not aware of this dx   Coronary artery disease    Diabetes mellitus without complication (HCC)    type 2   Dyspnea    GERD (gastroesophageal reflux disease)    History of blood transfusion    Hypertension    Sleep apnea    does not use cpap    Social History   Socioeconomic History   Marital status: Married    Spouse name: Not on file   Number of children: Not on file   Years of education: Not on file   Highest education level: Not on file  Occupational History   Not on file  Tobacco Use   Smoking  status: Never   Smokeless tobacco: Never  Vaping Use   Vaping Use: Never used  Substance and Sexual Activity   Alcohol use: No   Drug use: No   Sexual activity: Yes  Other Topics Concern   Not on file  Social History Narrative   Not on file   Social Determinants of Health   Financial Resource Strain: Not on file  Food Insecurity: Not on file  Transportation Needs: Not on file  Physical Activity: Not on file  Stress: Not on file  Social Connections: Not on file  Intimate Partner Violence: Not on file    Past Surgical History:  Procedure Laterality Date   APPENDECTOMY     CARDIAC CATHETERIZATION Left 08/16/2016   Procedure: Left Heart Cath and Coronary Angiography;  Surgeon: Yolonda Kida, MD;  Location: [place] [facility];  Service: Cardiovascular;  Laterality: Left;   COLON SURGERY     part of colon removed   COLONOSCOPY N/A 09/07/2020   Procedure: COLONOSCOPY;  Surgeon: Virgel Manifold, MD;  Location: [facility];  Service: Endoscopy;  Laterality: N/A;   CORONARY ARTERY BYPASS GRAFT  08/30/2016   triple   CORONARY STENT INTERVENTION N/A 06/09/2020   Procedure: CORONARY STENT INTERVENTION;  Surgeon: Yolonda Kida, MD;  Location: [place] [facility];  Service: Cardiovascular;  Laterality: N/A;   ENDOSCOPIC MUCOSAL RESECTION N/A 09/22/2020   Procedure: ENDOSCOPIC MUCOSAL RESECTION;  Surgeon: Rush Landmark Telford Nab., MD;  Location: [place];  Service: Gastroenterology;  Laterality: N/A;   ESOPHAGOGASTRODUODENOSCOPY N/A 09/07/2020   Procedure: ESOPHAGOGASTRODUODENOSCOPY (EGD);  Surgeon: Virgel Manifold, MD;  Location: [facility];  Service: Endoscopy;  Laterality: N/A;   ESOPHAGOGASTRODUODENOSCOPY (EGD) WITH PROPOFOL N/A 09/22/2020   Procedure: ESOPHAGOGASTRODUODENOSCOPY (EGD) WITH PROPOFOL;  Surgeon: Rush Landmark Telford Nab., MD;  Location: [place];  Service: Gastroenterology;  Laterality: N/A;   HEMOSTASIS CLIP PLACEMENT  09/22/2020   Procedure:  HEMOSTASIS CLIP PLACEMENT;  Surgeon: Irving Copas., MD;  Location: [place];  Service: Gastroenterology;;   HEMOSTASIS CONTROL  09/22/2020   Procedure: HEMOSTASIS CONTROL;  Surgeon: Irving Copas., MD;  Location: [place];  Service: Gastroenterology;;   LEFT HEART CATH AND CORONARY ANGIOGRAPHY Left 06/09/2020   Procedure: LEFT HEART CATH AND CORONARY ANGIOGRAPHY;  Surgeon: Yolonda Kida, MD;  Location: [place] [facility];  Service: Cardiovascular;  Laterality: Left;   LEFT HEART CATH AND CORS/GRAFTS ANGIOGRAPHY Left 11/20/2016   Procedure: Left Heart Cath and Cors/Grafts Angiography;  Surgeon: Yolonda Kida, MD;  Location: [place] [facility];  Service: Cardiovascular;  Laterality: Left;   POLYPECTOMY  09/22/2020   Procedure: POLYPECTOMY;  Surgeon: Rush Landmark Telford Nab., MD;  Location: [place];  Service: Gastroenterology;;   SUBMUCOSAL LIFTING INJECTION  09/22/2020   Procedure: SUBMUCOSAL LIFTING INJECTION;  Surgeon: Irving Copas., MD;  Location: [place];  Service: Gastroenterology;;    History reviewed. No pertinent family history.  No Known Allergies  CBC Latest Ref Rng & Units 01/13/2021 11/01/2020 09/08/2020  WBC 3.4 - 10.8 x10E3/uL - 6.8 5.5  Hemoglobin 13.0 - 17.0 g/dL 12.0(L) 11.3(L) 9.8(L)  Hematocrit 39.0 - 52.0 % 37.2(L) 36.4(L) 31.9(L)  Platelets 150 - 450 x10E3/uL - 158 176      CMP     Component Value Date/Time   NA 139 09/08/2020 0650   NA 139 08/04/2012 1103   K 3.5 09/08/2020 0650   K 4.1 08/04/2012 1103   CL 104 09/08/2020 0650   CL 105 08/04/2012 1103   CO2 26 09/08/2020 0650   CO2 27 08/04/2012 1103   GLUCOSE 214 (H) 09/08/2020 0650   GLUCOSE 126 (H) 08/04/2012 1103   BUN 12 09/08/2020 0650   BUN 17 08/04/2012 1103   CREATININE 1.19 09/08/2020 0650   CREATININE 1.03 08/04/2012 1103   CALCIUM 9.8 09/08/2020 0650   CALCIUM 10.8 (H) 09/03/2020 0857   PROT 6.4 (L) 09/08/2020 0650   PROT 7.6 08/04/2012  1103   ALBUMIN 3.7 09/08/2020 0650   ALBUMIN 4.1 08/04/2012 1103   AST 24 09/08/2020 0650   AST 24 08/04/2012 1103   ALT 20 09/08/2020 0650   ALT 33 08/04/2012 1103   ALKPHOS 88 09/08/2020 0650   ALKPHOS 117 08/04/2012 1103   BILITOT 0.4 09/08/2020 0650   BILITOT 0.3 08/04/2012 1103   GFRNONAA >60 09/08/2020 0650   GFRNONAA >60 08/04/2012 1103   GFRAA >60 02/11/2017 0915   GFRAA >60 08/04/2012 1103     No results found.     Assessment & Plan:   1. Lymphedema No surgery or intervention at this point in time.  I have reviewed my discussion with the patient regarding venous insufficiency and why it causes symptoms. I have discussed with the patient the chronic skin changes that  accompany venous insufficiency and the long term sequela such as ulceration. Patient will contnue wearing graduated compression stockings on a daily basis, as this has provided excellent control of his edema. The patient will put the stockings on first thing in the morning and removing them in the evening. The patient is reminded not to sleep in the stockings.  In addition, behavioral modification including elevation during the day will be initiated. Exercise is strongly encouraged.  Duplex ultrasound of the left lower extremity shows no DVT or venous insufficiency   Given the patient's good control and lack of any problems regarding the venous insufficiency and lymphedema a lymph pump in not need at this time.  The patient will follow up with me PRN should anything change.  The patient voices agreement with this plan.   2. Other hyperlipidemia Continue statin as ordered and reviewed, no changes at this time   3. Essential hypertension Continue antihypertensive medications as already ordered, these medications have been reviewed and there are no changes at this time.    Current Outpatient Medications on File Prior to Visit  Medication Sig Dispense Refill   amLODipine (NORVASC) 10 MG tablet Take 10 mg  by mouth daily.     aspirin EC 81 MG tablet Take 81 mg by mouth daily. Swallow whole.     clopidogrel (PLAVIX) 75 MG tablet Take 75 mg by mouth daily.     hydrALAZINE (APRESOLINE) 25 MG tablet Take 25 mg by mouth 3 (three) times daily.     hydrochlorothiazide (HYDRODIURIL) 12.5 MG tablet Take 12.5 mg by mouth daily.     insulin glargine (LANTUS) 100 UNIT/ML injection Inject 25 Units into the skin at bedtime.      irbesartan (AVAPRO) 300 MG tablet Take 1 tablet (300 mg total) by mouth daily. 30 tablet 1   metFORMIN (GLUCOPHAGE-XR) 500 MG 24 hr tablet Take 500 mg by mouth 2 (two) times daily.     nebivolol (BYSTOLIC) 10 MG tablet Take 10 mg by mouth daily.      pantoprazole (PROTONIX) 20 MG tablet Take 1 tablet (20 mg total) by mouth 2 (two) times daily. 180 tablet 0   rosuvastatin (CRESTOR) 40 MG tablet Take 40 mg by mouth daily.     sitaGLIPtin (JANUVIA) 100 MG tablet Take 100 mg by mouth daily.     Testosterone 1.62 % GEL Apply 2 Pump topically daily. One pump on each arm 225 g 3   triamcinolone (KENALOG) 0.025 % cream Apply 1 application topically 3 (three) times a week.     vitamin B-12 (CYANOCOBALAMIN) 1000 MCG tablet Take 1 tablet (1,000 mcg total) by mouth daily. 30 tablet 0   acetaminophen (TYLENOL) 325 MG tablet Take 2 tablets (650 mg total) by mouth every 6 (six) hours as needed for mild pain (or Fever >/= 101). (Patient not taking: Reported on 02/06/2021)     sucralfate (CARAFATE) 1 GM/10ML suspension Take 10 mLs (1 g total) by mouth 3 (three) times daily as needed (abdominal discomfort). (Patient not taking: Reported on 02/06/2021) 800 mL 1   No current facility-administered medications on file prior to visit.    There are no Patient Instructions on file for this visit. No follow-ups on file.   Kris Hartmann, NP

## 2021-02-07 ENCOUNTER — Inpatient Hospital Stay (HOSPITAL_BASED_OUTPATIENT_CLINIC_OR_DEPARTMENT_OTHER): Payer: Medicare HMO | Admitting: Oncology

## 2021-02-07 ENCOUNTER — Inpatient Hospital Stay: Payer: Medicare HMO | Attending: Oncology

## 2021-02-07 VITALS — BP 180/85 | HR 60 | Temp 97.0°F | Resp 18 | Wt 255.0 lb

## 2021-02-07 DIAGNOSIS — K219 Gastro-esophageal reflux disease without esophagitis: Secondary | ICD-10-CM | POA: Insufficient documentation

## 2021-02-07 DIAGNOSIS — E119 Type 2 diabetes mellitus without complications: Secondary | ICD-10-CM | POA: Diagnosis not present

## 2021-02-07 DIAGNOSIS — Z79899 Other long term (current) drug therapy: Secondary | ICD-10-CM | POA: Insufficient documentation

## 2021-02-07 DIAGNOSIS — N189 Chronic kidney disease, unspecified: Secondary | ICD-10-CM | POA: Diagnosis not present

## 2021-02-07 DIAGNOSIS — Z7982 Long term (current) use of aspirin: Secondary | ICD-10-CM | POA: Insufficient documentation

## 2021-02-07 DIAGNOSIS — I10 Essential (primary) hypertension: Secondary | ICD-10-CM | POA: Diagnosis not present

## 2021-02-07 DIAGNOSIS — D509 Iron deficiency anemia, unspecified: Secondary | ICD-10-CM | POA: Insufficient documentation

## 2021-02-07 DIAGNOSIS — G4730 Sleep apnea, unspecified: Secondary | ICD-10-CM | POA: Insufficient documentation

## 2021-02-07 DIAGNOSIS — Z794 Long term (current) use of insulin: Secondary | ICD-10-CM | POA: Diagnosis not present

## 2021-02-07 LAB — CBC
HCT: 39.1 % (ref 39.0–52.0)
Hemoglobin: 12.5 g/dL — ABNORMAL LOW (ref 13.0–17.0)
MCH: 29.6 pg (ref 26.0–34.0)
MCHC: 32 g/dL (ref 30.0–36.0)
MCV: 92.7 fL (ref 80.0–100.0)
Platelets: 155 10*3/uL (ref 150–400)
RBC: 4.22 MIL/uL (ref 4.22–5.81)
RDW: 15.9 % — ABNORMAL HIGH (ref 11.5–15.5)
WBC: 6.8 10*3/uL (ref 4.0–10.5)
nRBC: 0 % (ref 0.0–0.2)

## 2021-02-07 LAB — IRON AND TIBC
Iron: 59 ug/dL (ref 45–182)
Saturation Ratios: 14 % — ABNORMAL LOW (ref 17.9–39.5)
TIBC: 412 ug/dL (ref 250–450)
UIBC: 353 ug/dL

## 2021-02-07 LAB — VITAMIN B12: Vitamin B-12: 439 pg/mL (ref 180–914)

## 2021-02-07 LAB — FERRITIN: Ferritin: 21 ng/mL — ABNORMAL LOW (ref 24–336)

## 2021-02-07 NOTE — Progress Notes (Signed)
IDA Follow up. No new concerns. Remains tired.

## 2021-02-10 ENCOUNTER — Encounter: Payer: Self-pay | Admitting: Oncology

## 2021-02-10 NOTE — Progress Notes (Signed)
Hematology/Oncology Consult note [facility]  [phone] [phone]  Patient Care Team: Albina Billet, MD as PCP - General (Internal Medicine)   Name of the patient: [name]  [id number]  [birth year]   Date of visit: 02/10/21  Diagnosis-iron deficiency anemia  Chief complaint/ Reason for visit-routine follow-up of iron deficiency anemia  Heme/Onc history: Patient is a 75 year old male with history of multiple gastric polyps who was seen by both Dr. Rush Landmark and Dr. Bonna Gains.  He has undergone EGD with Dr. Rush Landmark and had a large gastric hyperplastic polyp removal.  Prior to that patient had significant iron deficiency and his aspirin and Plavix were on hold.  These were restarted after EGD.  Most recent CBC on 11/01/2020 showed white count of 6.8, H&H of 11.3/36.4 with an MCV of 85 and a platelet count of 158.  Iron studies were normal.  Patient denies any bleeding in his stool or dark melanotic stools.    Interval history-patient is doing well presently.  Denies any specific complaints at this time.  Denies any bleeding in his stool or urine.  Denies any dark melanotic stools  ECOG PS- 1 Pain scale- 0   Review of systems- Review of Systems  Constitutional:  Negative for chills, fever, malaise/fatigue and weight loss.  HENT:  Negative for congestion, ear discharge and nosebleeds.   Eyes:  Negative for blurred vision.  Respiratory:  Negative for cough, hemoptysis, sputum production, shortness of breath and wheezing.   Cardiovascular:  Negative for chest pain, palpitations, orthopnea and claudication.  Gastrointestinal:  Negative for abdominal pain, blood in stool, constipation, diarrhea, heartburn, melena, nausea and vomiting.  Genitourinary:  Negative for dysuria, flank pain, frequency, hematuria and urgency.  Musculoskeletal:  Negative for back pain, joint pain and myalgias.  Skin:  Negative for rash.  Neurological:   Negative for dizziness, tingling, focal weakness, seizures, weakness and headaches.  Endo/Heme/Allergies:  Does not bruise/bleed easily.  Psychiatric/Behavioral:  Negative for depression and suicidal ideas. The patient does not have insomnia.     No Known Allergies   Past Medical History:  Diagnosis Date   Anemia    Arthritis    Chronic kidney disease    patient not aware of this dx   Coronary artery disease    Diabetes mellitus without complication (HCC)    type 2   Dyspnea    GERD (gastroesophageal reflux disease)    History of blood transfusion    Hypertension    Sleep apnea    does not use cpap     Past Surgical History:  Procedure Laterality Date   APPENDECTOMY     CARDIAC CATHETERIZATION Left 08/16/2016   Procedure: Left Heart Cath and Coronary Angiography;  Surgeon: Yolonda Kida, MD;  Location: [place] [facility];  Service: Cardiovascular;  Laterality: Left;   COLON SURGERY     part of colon removed   COLONOSCOPY N/A 09/07/2020   Procedure: COLONOSCOPY;  Surgeon: Virgel Manifold, MD;  Location: [facility];  Service: Endoscopy;  Laterality: N/A;   CORONARY ARTERY BYPASS GRAFT  08/30/2016   triple   CORONARY STENT INTERVENTION N/A 06/09/2020   Procedure: CORONARY STENT INTERVENTION;  Surgeon: Yolonda Kida, MD;  Location: [place] [facility];  Service: Cardiovascular;  Laterality: N/A;   ENDOSCOPIC MUCOSAL RESECTION N/A 09/22/2020   Procedure: ENDOSCOPIC MUCOSAL RESECTION;  Surgeon: Rush Landmark Telford Nab., MD;  Location: [place];  Service: Gastroenterology;  Laterality: N/A;   ESOPHAGOGASTRODUODENOSCOPY N/A  09/07/2020   Procedure: ESOPHAGOGASTRODUODENOSCOPY (EGD);  Surgeon: Virgel Manifold, MD;  Location: [facility];  Service: Endoscopy;  Laterality: N/A;   ESOPHAGOGASTRODUODENOSCOPY (EGD) WITH PROPOFOL N/A 09/22/2020   Procedure: ESOPHAGOGASTRODUODENOSCOPY (EGD) WITH PROPOFOL;  Surgeon: Rush Landmark Telford Nab., MD;  Location: [place];  Service: Gastroenterology;  Laterality: N/A;   HEMOSTASIS CLIP PLACEMENT  09/22/2020   Procedure: HEMOSTASIS CLIP PLACEMENT;  Surgeon: Irving Copas., MD;  Location: [place];  Service: Gastroenterology;;   HEMOSTASIS CONTROL  09/22/2020   Procedure: HEMOSTASIS CONTROL;  Surgeon: Irving Copas., MD;  Location: [place];  Service: Gastroenterology;;   LEFT HEART CATH AND CORONARY ANGIOGRAPHY Left 06/09/2020   Procedure: LEFT HEART CATH AND CORONARY ANGIOGRAPHY;  Surgeon: Yolonda Kida, MD;  Location: [place] [facility];  Service: Cardiovascular;  Laterality: Left;   LEFT HEART CATH AND CORS/GRAFTS ANGIOGRAPHY Left 11/20/2016   Procedure: Left Heart Cath and Cors/Grafts Angiography;  Surgeon: Yolonda Kida, MD;  Location: [place] [facility];  Service: Cardiovascular;  Laterality: Left;   POLYPECTOMY  09/22/2020   Procedure: POLYPECTOMY;  Surgeon: Mansouraty, Telford Nab., MD;  Location: [place];  Service: Gastroenterology;;   SUBMUCOSAL LIFTING INJECTION  09/22/2020   Procedure: SUBMUCOSAL LIFTING INJECTION;  Surgeon: Irving Copas., MD;  Location: [facility];  Service: Gastroenterology;;    Social History   Socioeconomic History   Marital status: Married    Spouse name: Not on file   Number of children: Not on file   Years of education: Not on file   Highest education level: Not on file  Occupational History   Not on file  Tobacco Use   Smoking status: Never   Smokeless tobacco: Never  Vaping Use   Vaping Use: Never used  Substance and Sexual Activity   Alcohol use: No   Drug use: No   Sexual activity: Yes  Other Topics Concern   Not on file  Social History Narrative   Not on file   Social Determinants of Health   Financial Resource Strain: Not on file  Food Insecurity: Not on file  Transportation Needs: Not on file  Physical Activity: Not on file  Stress: Not on file  Social Connections: Not on file  Intimate  Partner Violence: Not on file    No family history on file.   Current Outpatient Medications:    acetaminophen (TYLENOL) 325 MG tablet, Take 2 tablets (650 mg total) by mouth every 6 (six) hours as needed for mild pain (or Fever >/= 101)., Disp: , Rfl:    amLODipine (NORVASC) 10 MG tablet, Take 10 mg by mouth daily., Disp: , Rfl:    aspirin EC 81 MG tablet, Take 81 mg by mouth daily. Swallow whole., Disp: , Rfl:    clopidogrel (PLAVIX) 75 MG tablet, Take 75 mg by mouth daily., Disp: , Rfl:    hydrALAZINE (APRESOLINE) 25 MG tablet, Take 25 mg by mouth 3 (three) times daily., Disp: , Rfl:    hydrochlorothiazide (HYDRODIURIL) 12.5 MG tablet, Take 12.5 mg by mouth daily., Disp: , Rfl:    insulin glargine (LANTUS) 100 UNIT/ML injection, Inject 25 Units into the skin at bedtime. , Disp: , Rfl:    irbesartan (AVAPRO) 300 MG tablet, Take 1 tablet (300 mg total) by mouth daily., Disp: 30 tablet, Rfl: 1   metFORMIN (GLUCOPHAGE-XR) 500 MG 24 hr tablet, Take 500 mg by mouth 2 (two) times daily., Disp: , Rfl:    nebivolol (BYSTOLIC) 10 MG tablet, Take 10  mg by mouth daily. , Disp: , Rfl:    pantoprazole (PROTONIX) 20 MG tablet, Take 1 tablet (20 mg total) by mouth 2 (two) times daily., Disp: 180 tablet, Rfl: 0   rosuvastatin (CRESTOR) 40 MG tablet, Take 40 mg by mouth daily., Disp: , Rfl:    sitaGLIPtin (JANUVIA) 100 MG tablet, Take 100 mg by mouth daily., Disp: , Rfl:    Testosterone 1.62 % GEL, Apply 2 Pump topically daily. One pump on each arm, Disp: 225 g, Rfl: 3   triamcinolone (KENALOG) 0.025 % cream, Apply 1 application topically 3 (three) times a week., Disp: , Rfl:    vitamin B-12 (CYANOCOBALAMIN) 1000 MCG tablet, Take 1 tablet (1,000 mcg total) by mouth daily., Disp: 30 tablet, Rfl: 0  Physical exam:  Vitals:   02/07/21 1153  BP: (!) 180/85  Pulse: 60  Resp: 18  Temp: (!) 97 F (36.1 C)  TempSrc: Tympanic  SpO2: 99%  Weight: 255 lb (115.7 kg)   Physical Exam Constitutional:       General: He is not in acute distress. Cardiovascular:     Rate and Rhythm: Normal rate and regular rhythm.     Heart sounds: Normal heart sounds.  Pulmonary:     Effort: Pulmonary effort is normal.     Breath sounds: Normal breath sounds.  Abdominal:     General: Bowel sounds are normal.     Palpations: Abdomen is soft.  Skin:    General: Skin is warm and dry.  Neurological:     Mental Status: He is alert and oriented to person, place, and time.     CMP Latest Ref Rng & Units 09/08/2020  Glucose 70 - 99 mg/dL 214(H)  BUN 8 - 23 mg/dL 12  Creatinine 0.61 - 1.24 mg/dL 1.19  Sodium 135 - 145 mmol/L 139  Potassium 3.5 - 5.1 mmol/L 3.5  Chloride 98 - 111 mmol/L 104  CO2 22 - 32 mmol/L 26  Calcium 8.9 - 10.3 mg/dL 9.8  Total Protein 6.5 - 8.1 g/dL 6.4(L)  Total Bilirubin 0.3 - 1.2 mg/dL 0.4  Alkaline Phos 38 - 126 U/L 88  AST 15 - 41 U/L 24  ALT 0 - 44 U/L 20   CBC Latest Ref Rng & Units 02/07/2021  WBC 4.0 - 10.5 K/uL 6.8  Hemoglobin 13.0 - 17.0 g/dL 12.5(L)  Hematocrit 39.0 - 52.0 % 39.1  Platelets 150 - 400 K/uL 155     Assessment and plan- Patient is a 75 y.o. male with history of iron deficiency anemia likely secondary to bleeding from gastric polyp s/p endoscopic intervention here for routine follow-up  Patient's hemoglobin continues to trend up and is now at 12.5 as compared to 7.6 in January 2022.His iron studies show a ferritin level of 21 which is still lower than 30 with an iron saturation of 14% but overall gradually improving.  B12 levels have now normalized.  We did discuss IV iron versus restarting oral iron which patient had stopped taking.  He has not really received any IV iron since January 2022.  Patient would like to try oral iron at this time and I have recommended that he should take it either once daily or every other day.  We will repeat CBC ferritin and iron studies in 3 in 6 months and see him back in 6 months   Visit Diagnosis 1. Iron deficiency anemia,  unspecified iron deficiency anemia type      Dr. Randa Evens, MD, MPH [facility]  at [facility] [phone] 02/10/2021 8:26 AM

## 2021-02-15 ENCOUNTER — Telehealth: Payer: Self-pay | Admitting: *Deleted

## 2021-02-15 ENCOUNTER — Telehealth: Payer: Self-pay | Admitting: Gastroenterology

## 2021-02-15 NOTE — Telephone Encounter (Signed)
pantoprazole (PROTONIX) 20 MG tablet  Insurance will not pay for two a day, patient needs 40 mg once a day.   [facility] w/ a 90 day refill.

## 2021-02-15 NOTE — Telephone Encounter (Signed)
Patient called in today and was checking to see if we ever got his prior auth.for his gel. Checked with [place] and she has not seen it. Asked patient to tell them to fax over paper.

## 2021-02-15 NOTE — Telephone Encounter (Signed)
Looks like carrie was working on this per phone note 01/31/2021

## 2021-02-16 ENCOUNTER — Other Ambulatory Visit: Payer: Self-pay

## 2021-02-16 MED ORDER — PANTOPRAZOLE SODIUM 20 MG PO TBEC: 20.0000 mg | DELAYED_RELEASE_TABLET | Freq: Every day | ORAL | 3 refills | Status: DC

## 2021-02-16 NOTE — Telephone Encounter (Signed)
[facility] per pt request.

## 2021-03-21 ENCOUNTER — Telehealth: Payer: Self-pay

## 2021-03-21 NOTE — Telephone Encounter (Signed)
Pt report RLQ abd pain intermittent x 3 days along with constipation.... pt has been taking Miralax QD x 3 days and was able to have a small BM today and some relief w/ abd pain... Pt denies blood in stool or N/V  Please advise

## 2021-03-21 NOTE — Telephone Encounter (Signed)
Patient verbalized understanding of instructions. Made his follow up appointment for 03/30/21

## 2021-03-30 ENCOUNTER — Encounter: Payer: Self-pay | Admitting: Gastroenterology

## 2021-03-30 ENCOUNTER — Ambulatory Visit (INDEPENDENT_AMBULATORY_CARE_PROVIDER_SITE_OTHER): Payer: Medicare HMO | Admitting: Gastroenterology

## 2021-03-30 VITALS — BP 188/92 | HR 66 | Temp 98.3°F | Ht 73.0 in | Wt 253.2 lb

## 2021-03-30 DIAGNOSIS — D500 Iron deficiency anemia secondary to blood loss (chronic): Secondary | ICD-10-CM

## 2021-03-30 DIAGNOSIS — K219 Gastro-esophageal reflux disease without esophagitis: Secondary | ICD-10-CM

## 2021-03-30 DIAGNOSIS — K5900 Constipation, unspecified: Secondary | ICD-10-CM | POA: Diagnosis not present

## 2021-03-30 NOTE — Progress Notes (Signed)
Vonda Antigua, MD [address]  [place]  [place], [place] [address]  Main: [phone]  Fax: [phone]   Primary Care Physician: Albina Billet, MD   Chief complaint: Constipation, anemia  HPI: [name] is a 75 y.o. male here for follow-up of iron deficiency anemia and constipation.  Iron deficiency anemia has continued to improve since removal of large gastric hyperplastic polyp by Dr. Rush Landmark.  Patient is having constipation previously and after taking Dulcolax as needed, this is resolving as he is now having 1 soft bowel movement daily.  He is eating a high-fiber diet.  Denies any abdominal pain, no nausea or vomiting.  Patient has a hiatal hernia, Schatzki's ring, wide open.  Denies any dysphagia.  Patient is taking Protonix once daily and states continues to have significant reflux with nighttime symptoms when PPIs were first started.   ROS: All ROS reviewed and negative except as per HPI   Past Medical History:  Diagnosis Date   Anemia    Arthritis    Chronic kidney disease    patient not aware of this dx   Coronary artery disease    Diabetes mellitus without complication (HCC)    type 2   Dyspnea    GERD (gastroesophageal reflux disease)    History of blood transfusion    Hypertension    Sleep apnea    does not use cpap    Past Surgical History:  Procedure Laterality Date   APPENDECTOMY     CARDIAC CATHETERIZATION Left 08/16/2016   Procedure: Left Heart Cath and Coronary Angiography;  Surgeon: Yolonda Kida, MD;  Location: [place] [facility];  Service: Cardiovascular;  Laterality: Left;   COLON SURGERY     part of colon removed   COLONOSCOPY N/A 09/07/2020   Procedure: COLONOSCOPY;  Surgeon: Virgel Manifold, MD;  Location: [facility];  Service: Endoscopy;  Laterality: N/A;   CORONARY ARTERY BYPASS GRAFT  08/30/2016   triple   CORONARY STENT INTERVENTION N/A 06/09/2020   Procedure: CORONARY STENT INTERVENTION;  Surgeon:  Yolonda Kida, MD;  Location: [place] [facility];  Service: Cardiovascular;  Laterality: N/A;   ENDOSCOPIC MUCOSAL RESECTION N/A 09/22/2020   Procedure: ENDOSCOPIC MUCOSAL RESECTION;  Surgeon: Rush Landmark Telford Nab., MD;  Location: [place];  Service: Gastroenterology;  Laterality: N/A;   ESOPHAGOGASTRODUODENOSCOPY N/A 09/07/2020   Procedure: ESOPHAGOGASTRODUODENOSCOPY (EGD);  Surgeon: Virgel Manifold, MD;  Location: [facility];  Service: Endoscopy;  Laterality: N/A;   ESOPHAGOGASTRODUODENOSCOPY (EGD) WITH PROPOFOL N/A 09/22/2020   Procedure: ESOPHAGOGASTRODUODENOSCOPY (EGD) WITH PROPOFOL;  Surgeon: Rush Landmark Telford Nab., MD;  Location: [place];  Service: Gastroenterology;  Laterality: N/A;   HEMOSTASIS CLIP PLACEMENT  09/22/2020   Procedure: HEMOSTASIS CLIP PLACEMENT;  Surgeon: Irving Copas., MD;  Location: [place];  Service: Gastroenterology;;   HEMOSTASIS CONTROL  09/22/2020   Procedure: HEMOSTASIS CONTROL;  Surgeon: Irving Copas., MD;  Location: [place];  Service: Gastroenterology;;   LEFT HEART CATH AND CORONARY ANGIOGRAPHY Left 06/09/2020   Procedure: LEFT HEART CATH AND CORONARY ANGIOGRAPHY;  Surgeon: Yolonda Kida, MD;  Location: [place] [facility];  Service: Cardiovascular;  Laterality: Left;   LEFT HEART CATH AND CORS/GRAFTS ANGIOGRAPHY Left 11/20/2016   Procedure: Left Heart Cath and Cors/Grafts Angiography;  Surgeon: Yolonda Kida, MD;  Location: [place] [facility];  Service: Cardiovascular;  Laterality: Left;   POLYPECTOMY  09/22/2020   Procedure: POLYPECTOMY;  Surgeon: Mansouraty, Telford Nab., MD;  Location: [facility]  ENDOSCOPY;  Service: Gastroenterology;;   SUBMUCOSAL LIFTING INJECTION  09/22/2020   Procedure: SUBMUCOSAL LIFTING INJECTION;  Surgeon: Irving Copas., MD;  Location: [place];  Service: Gastroenterology;;    Prior to Admission medications   Medication Sig Start Date End Date Taking? Authorizing  Provider  acetaminophen (TYLENOL) 325 MG tablet Take 2 tablets (650 mg total) by mouth every 6 (six) hours as needed for mild pain (or Fever >/= 101). 09/08/20   Cherene Altes, MD  amLODipine (NORVASC) 10 MG tablet Take 10 mg by mouth daily.    [provider]  aspirin EC 81 MG tablet Take 81 mg by mouth daily. Swallow whole.    [provider]  clopidogrel (PLAVIX) 75 MG tablet Take 75 mg by mouth daily.    [provider]  hydrALAZINE (APRESOLINE) 25 MG tablet Take 25 mg by mouth 3 (three) times daily.    [provider]  hydrochlorothiazide (HYDRODIURIL) 12.5 MG tablet Take 12.5 mg by mouth daily.    [provider]  insulin glargine (LANTUS) 100 UNIT/ML injection Inject 25 Units into the skin at bedtime.     [provider]  irbesartan (AVAPRO) 300 MG tablet Take 1 tablet (300 mg total) by mouth daily. 09/09/20   Cherene Altes, MD  metFORMIN (GLUCOPHAGE-XR) 500 MG 24 hr tablet Take 500 mg by mouth 2 (two) times daily.    [provider]  nebivolol (BYSTOLIC) 10 MG tablet Take 10 mg by mouth daily.  06/29/19   [provider]  pantoprazole (PROTONIX) 20 MG tablet Take 1 tablet (20 mg total) by mouth daily. 02/16/21   Lucilla Lame, MD  rosuvastatin (CRESTOR) 40 MG tablet Take 40 mg by mouth daily. 06/22/20 06/22/21  [provider]  sitaGLIPtin (JANUVIA) 100 MG tablet Take 100 mg by mouth daily.    [provider]  Testosterone 1.62 % GEL Apply 2 Pump topically daily. One pump on each arm 01/16/21   McGowan, Larene Beach A, PA-C  triamcinolone (KENALOG) 0.025 % cream Apply 1 application topically 3 (three) times a week. 06/23/19   [provider]  vitamin B-12 (CYANOCOBALAMIN) 1000 MCG tablet Take 1 tablet (1,000 mcg total) by mouth daily. 09/08/20   Cherene Altes, MD    No family history on file.   Social History   Tobacco Use   Smoking status: Never   Smokeless tobacco: Never  Vaping  Use   Vaping Use: Never used  Substance Use Topics   Alcohol use: No   Drug use: No    Allergies as of 03/30/2021   (No Known Allergies)    Physical Examination:  Constitutional: General:   Alert,  Well-developed, well-nourished, pleasant and cooperative in NAD BP (!) 188/92   Pulse 66   Temp 98.3 F (36.8 C) (Oral)   Ht '6\' 1"'$  (1.854 m)   Wt 253 lb 3.2 oz (114.9 kg)   BMI 33.41 kg/m   Respiratory: Normal respiratory effort  Gastrointestinal:  Soft, non-tender and non-distended without masses, hepatosplenomegaly or hernias noted.  No guarding or rebound tenderness.     Cardiac: No clubbing or edema.  No cyanosis. Normal posterior tibial pedal pulses noted.  Psych:  Alert and cooperative. Normal mood and affect.  Musculoskeletal:  Normal gait. Head normocephalic, atraumatic. Symmetrical without gross deformities. 5/5 Lower extremity strength bilaterally.  Skin: Warm. Intact without significant lesions or rashes. No jaundice.  Neck: Supple, trachea midline  Lymph: No cervical lymphadenopathy  Psych:  Alert and  oriented x3, Alert and cooperative. Normal mood and affect.  Labs: CMP     Component Value Date/Time   NA 139 09/08/2020 0650   NA 139 08/04/2012 1103   K 3.5 09/08/2020 0650   K 4.1 08/04/2012 1103   CL 104 09/08/2020 0650   CL 105 08/04/2012 1103   CO2 26 09/08/2020 0650   CO2 27 08/04/2012 1103   GLUCOSE 214 (H) 09/08/2020 0650   GLUCOSE 126 (H) 08/04/2012 1103   BUN 12 09/08/2020 0650   BUN 17 08/04/2012 1103   CREATININE 1.19 09/08/2020 0650   CREATININE 1.03 08/04/2012 1103   CALCIUM 9.8 09/08/2020 0650   CALCIUM 10.8 (H) 09/03/2020 0857   PROT 6.4 (L) 09/08/2020 0650   PROT 7.6 08/04/2012 1103   ALBUMIN 3.7 09/08/2020 0650   ALBUMIN 4.1 08/04/2012 1103   AST 24 09/08/2020 0650   AST 24 08/04/2012 1103   ALT 20 09/08/2020 0650   ALT 33 08/04/2012 1103   ALKPHOS 88 09/08/2020 0650   ALKPHOS 117 08/04/2012 1103   BILITOT 0.4 09/08/2020  0650   BILITOT 0.3 08/04/2012 1103   GFRNONAA >60 09/08/2020 0650   GFRNONAA >60 08/04/2012 1103   GFRAA >60 02/11/2017 0915   GFRAA >60 08/04/2012 1103   Lab Results  Component Value Date   WBC 6.8 02/07/2021   HGB 12.5 (L) 02/07/2021   HCT 39.1 02/07/2021   MCV 92.7 02/07/2021   PLT 155 02/07/2021    Imaging Studies:   Assessment and Plan:   [name] is a 74 y.o. y/o male in for follow-up of iron deficiency anemia, and constipation  Iron deficiency anemia has continued to improve on most recent labs  Continue follow-up with hematology and patient is currently on oral iron  We discussed colonoscopy again given suboptimal prep noted on last procedure.  Patient again states that he would like to schedule in a couple months, but does not want to schedule today.  States he will talk to his son and will call us back when he is ready to schedule.  I offered to make him an appointment again in 2 to 3 months to rediscuss this, but he states he would rather just call us back when he is ready after discussing it with his son.  Clinic follow-up recall set for 6 months as per his wishes  We have been able to decrease his PPI to low-dose at Protonix 20 mg once daily compared to nocardiosis that he was taking before.  Given the presence of Schatzki's ring, hiatal hernia, and previous severe symptoms especially at nighttime prior to PPI therapy, low-dose PPI therapy would have however benefits at this time recommended.  Literature has shown that patients with Schatzki's ring, do better as far as needing dilations when they are on PPI compared to not on PPI.  In addition, patient is hesitant to discontinue the medication completely  We also discussed hiatal hernia surgery referral, but patient does not interested in this time but states he will think about it and will call us back if he is interested in the future  (Risks of PPI use were discussed with patient including bone loss, C. Diff  diarrhea, pneumonia, infections, CKD, electrolyte abnormalities.  Pt. Verbalizes understanding and chooses to continue the medication.)  Patient educated extensively on acid reflux lifestyle modification, including buying a bed wedge, not eating 3 hrs before bedtime, diet modifications, and handout given for the same.    Constipation resolved after he took  Dulcolax for 1 to 2 days.  He is eating a high-fiber diet and is now having 1 soft bowel movement today.  However, if he starts getting constipated again, I have asked him to start taking MiraLAX once a day for about 3 to 4 weeks and to let us know if this occurs as well.    Dr Vonda Antigua

## 2021-04-20 ENCOUNTER — Telehealth: Payer: Self-pay

## 2021-04-20 NOTE — Telephone Encounter (Signed)
Pt. Calling to schedule colonoscopy 

## 2021-04-20 NOTE — Telephone Encounter (Signed)
Patient is wanting to schedule her colonoscopy that Dr. Bonna Gains wanted to have done at last office visit

## 2021-04-21 ENCOUNTER — Telehealth: Payer: Self-pay

## 2021-04-21 ENCOUNTER — Other Ambulatory Visit: Payer: Self-pay

## 2021-04-21 DIAGNOSIS — D500 Iron deficiency anemia secondary to blood loss (chronic): Secondary | ICD-10-CM

## 2021-04-21 DIAGNOSIS — Z86010 Personal history of colonic polyps: Secondary | ICD-10-CM

## 2021-04-21 NOTE — Telephone Encounter (Signed)
Colonoscopy instructions have been mailed to pt  Also blood thinner request faxed to [facility]

## 2021-04-21 NOTE — Telephone Encounter (Signed)
I spoke to pt and he would like to schedule colonoscopy for Oct 5.... what diagnosis do I need to use for the procedure?

## 2021-04-28 ENCOUNTER — Telehealth: Payer: Self-pay | Admitting: Gastroenterology

## 2021-04-28 NOTE — Telephone Encounter (Signed)
Pt. Calling about his upcoming procedure. He says he needs to know when to stop taking his plavix and his other meds. He is requesting a call back

## 2021-05-02 NOTE — Telephone Encounter (Signed)
Received blood thinner request back from Dr Clayborn Bigness and states pt will need to stop Plavix/Aspirin 7 days prior to procedure and restart 2 days after.... Pt expressed understanding and request from sent to be scanned into chart

## 2021-05-03 ENCOUNTER — Ambulatory Visit: Payer: Medicare HMO | Admitting: Gastroenterology

## 2021-05-09 ENCOUNTER — Other Ambulatory Visit: Payer: Self-pay

## 2021-05-09 ENCOUNTER — Inpatient Hospital Stay: Payer: Medicare HMO | Attending: Oncology

## 2021-05-09 DIAGNOSIS — D509 Iron deficiency anemia, unspecified: Secondary | ICD-10-CM | POA: Diagnosis present

## 2021-05-09 LAB — CBC WITH DIFFERENTIAL/PLATELET
Abs Immature Granulocytes: 0.02 10*3/uL (ref 0.00–0.07)
Basophils Absolute: 0 10*3/uL (ref 0.0–0.1)
Basophils Relative: 1 %
Eosinophils Absolute: 0.1 10*3/uL (ref 0.0–0.5)
Eosinophils Relative: 2 %
HCT: 36.5 % — ABNORMAL LOW (ref 39.0–52.0)
Hemoglobin: 11.7 g/dL — ABNORMAL LOW (ref 13.0–17.0)
Immature Granulocytes: 0 %
Lymphocytes Relative: 13 %
Lymphs Abs: 0.8 10*3/uL (ref 0.7–4.0)
MCH: 29.3 pg (ref 26.0–34.0)
MCHC: 32.1 g/dL (ref 30.0–36.0)
MCV: 91.5 fL (ref 80.0–100.0)
Monocytes Absolute: 0.6 10*3/uL (ref 0.1–1.0)
Monocytes Relative: 9 %
Neutro Abs: 4.4 10*3/uL (ref 1.7–7.7)
Neutrophils Relative %: 75 %
Platelets: 125 10*3/uL — ABNORMAL LOW (ref 150–400)
RBC: 3.99 MIL/uL — ABNORMAL LOW (ref 4.22–5.81)
RDW: 15.5 % (ref 11.5–15.5)
WBC: 5.8 10*3/uL (ref 4.0–10.5)
nRBC: 0 % (ref 0.0–0.2)

## 2021-05-09 LAB — FERRITIN: Ferritin: 22 ng/mL — ABNORMAL LOW (ref 24–336)

## 2021-05-09 LAB — IRON AND TIBC
Iron: 57 ug/dL (ref 45–182)
Saturation Ratios: 14 % — ABNORMAL LOW (ref 17.9–39.5)
TIBC: 402 ug/dL (ref 250–450)
UIBC: 345 ug/dL

## 2021-05-10 ENCOUNTER — Telehealth: Payer: Self-pay | Admitting: Oncology

## 2021-05-10 ENCOUNTER — Telehealth: Payer: Self-pay | Admitting: Gastroenterology

## 2021-05-10 MED ORDER — PEG 3350-KCL-NABCB-NACL-NASULF 236 G PO SOLR: 4000.0000 mL | Freq: Once | ORAL | 0 refills | Status: AC

## 2021-05-10 NOTE — Telephone Encounter (Signed)
Attempt made to reach patient on home/mobile #'s. Left VM requesting he call back in order to get set up for a total of 5 iron infusions.

## 2021-05-10 NOTE — Telephone Encounter (Signed)
Pt. Has questions about his procedure

## 2021-05-11 NOTE — Telephone Encounter (Signed)
I spoke to pt and he stated that he no longer had any questions at this time

## 2021-05-17 ENCOUNTER — Encounter: Payer: Self-pay | Admitting: Gastroenterology

## 2021-05-17 ENCOUNTER — Other Ambulatory Visit: Payer: Self-pay | Admitting: Oncology

## 2021-05-17 ENCOUNTER — Other Ambulatory Visit: Payer: Self-pay

## 2021-05-17 ENCOUNTER — Encounter
Admission: RE | Disposition: A | Discharge status: Home / Self Care | Payer: Self-pay | Source: Home / Self Care | Attending: Gastroenterology

## 2021-05-17 ENCOUNTER — Ambulatory Visit
Admission: RE | Admit: 2021-05-17 | Discharge: 2021-05-17 | Disposition: A | Discharge status: Home / Self Care | Payer: Medicare HMO | Attending: Gastroenterology | Admitting: Gastroenterology

## 2021-05-17 ENCOUNTER — Ambulatory Visit: Payer: Medicare HMO | Admitting: Anesthesiology

## 2021-05-17 DIAGNOSIS — Z79899 Other long term (current) drug therapy: Secondary | ICD-10-CM | POA: Diagnosis not present

## 2021-05-17 DIAGNOSIS — Z7902 Long term (current) use of antithrombotics/antiplatelets: Secondary | ICD-10-CM | POA: Diagnosis not present

## 2021-05-17 DIAGNOSIS — Z7984 Long term (current) use of oral hypoglycemic drugs: Secondary | ICD-10-CM | POA: Insufficient documentation

## 2021-05-17 DIAGNOSIS — Z9049 Acquired absence of other specified parts of digestive tract: Secondary | ICD-10-CM | POA: Diagnosis not present

## 2021-05-17 DIAGNOSIS — D500 Iron deficiency anemia secondary to blood loss (chronic): Secondary | ICD-10-CM

## 2021-05-17 DIAGNOSIS — D508 Other iron deficiency anemias: Secondary | ICD-10-CM

## 2021-05-17 DIAGNOSIS — Z794 Long term (current) use of insulin: Secondary | ICD-10-CM | POA: Insufficient documentation

## 2021-05-17 DIAGNOSIS — K648 Other hemorrhoids: Secondary | ICD-10-CM | POA: Diagnosis not present

## 2021-05-17 DIAGNOSIS — Z7982 Long term (current) use of aspirin: Secondary | ICD-10-CM | POA: Insufficient documentation

## 2021-05-17 DIAGNOSIS — Z1211 Encounter for screening for malignant neoplasm of colon: Secondary | ICD-10-CM | POA: Insufficient documentation

## 2021-05-17 DIAGNOSIS — D123 Benign neoplasm of transverse colon: Secondary | ICD-10-CM | POA: Diagnosis not present

## 2021-05-17 DIAGNOSIS — K635 Polyp of colon: Secondary | ICD-10-CM

## 2021-05-17 DIAGNOSIS — Z86010 Personal history of colonic polyps: Secondary | ICD-10-CM

## 2021-05-17 DIAGNOSIS — D509 Iron deficiency anemia, unspecified: Secondary | ICD-10-CM | POA: Insufficient documentation

## 2021-05-17 HISTORY — PX: COLONOSCOPY WITH PROPOFOL: SHX5780

## 2021-05-17 LAB — GLUCOSE, CAPILLARY: Glucose-Capillary: 145 mg/dL — ABNORMAL HIGH (ref 70–99)

## 2021-05-17 SURGERY — COLONOSCOPY WITH PROPOFOL
Anesthesia: General

## 2021-05-17 MED ORDER — LIDOCAINE HCL (CARDIAC) PF 100 MG/5ML IV SOSY
PREFILLED_SYRINGE | INTRAVENOUS | Status: DC | PRN
  Administered 2021-05-17: 50 mg via INTRAVENOUS

## 2021-05-17 MED ORDER — PROPOFOL 500 MG/50ML IV EMUL
INTRAVENOUS | Status: AC
  Filled 2021-05-17: qty 50

## 2021-05-17 MED ORDER — PROPOFOL 10 MG/ML IV BOLUS
INTRAVENOUS | Status: DC | PRN
  Administered 2021-05-17: 30 mg via INTRAVENOUS
  Administered 2021-05-17: 50 mg via INTRAVENOUS

## 2021-05-17 MED ORDER — SODIUM CHLORIDE 0.9 % IV SOLN
INTRAVENOUS | Status: DC
  Administered 2021-05-17: 1000 mL via INTRAVENOUS

## 2021-05-17 MED ORDER — PROPOFOL 500 MG/50ML IV EMUL
INTRAVENOUS | Status: DC | PRN
  Administered 2021-05-17: 175 ug/kg/min via INTRAVENOUS

## 2021-05-17 NOTE — Progress Notes (Signed)
Pt noted to be in afib when he arrived for colonoscopy today. Rate controlled and asymptomatic. Colonoscopy performed with no significant events. 12 lead ECG obtained post procedure that confirms afib. Talked with Dr. Corky Sox from cardiology and said pt is ok to follow up with Dr. Clayborn Bigness as an outpatient.

## 2021-05-17 NOTE — Anesthesia Preprocedure Evaluation (Signed)
Anesthesia Evaluation  Patient identified by MRN, date of birth, ID band Patient awake    Reviewed: Allergy & Precautions, NPO status , Patient's Chart, lab work & pertinent test results  History of Anesthesia Complications Negative for: history of anesthetic complications  Airway Mallampati: III  TM Distance: >3 FB     Dental  (+) Dental Advidsory Given, Poor Dentition   Pulmonary shortness of breath and with exertion, sleep apnea , neg COPD, neg recent URI,    Pulmonary exam normal        Cardiovascular hypertension, (-) angina+ CAD, + Cardiac Stents and + CABG  (-) Past MI Normal cardiovascular exam+ dysrhythmias (-) Valvular Problems/Murmurs     Neuro/Psych negative neurological ROS  negative psych ROS   GI/Hepatic GERD  Medicated,  Endo/Other  diabetes  Renal/GU CRFRenal disease  negative genitourinary   Musculoskeletal  (+) Arthritis , Osteoarthritis,    Abdominal Normal abdominal exam  (+)   Peds negative pediatric ROS (+)  Hematology  (+) Blood dyscrasia, anemia ,   Anesthesia Other Findings Past Medical History: No date: Arthritis No date: Coronary artery disease No date: Diabetes mellitus without complication (HCC) No date: GERD (gastroesophageal reflux disease) No date: Sleep apnea EF 60% with no regional wall motion abnormalities  Reproductive/Obstetrics                             Anesthesia Physical  Anesthesia Plan  ASA: 3  Anesthesia Plan: General   Post-op Pain Management:    Induction: Intravenous  PONV Risk Score and Plan: Propofol infusion and TIVA  Airway Management Planned: Nasal Cannula and Natural Airway  Additional Equipment:   Intra-op Plan:   Post-operative Plan:   Informed Consent: I have reviewed the patients History and Physical, chart, labs and discussed the procedure including the risks, benefits and alternatives for the proposed  anesthesia with the patient or authorized representative who has indicated his/her understanding and acceptance.     Dental advisory given  Plan Discussed with: CRNA and Surgeon  Anesthesia Plan Comments:         Anesthesia Quick Evaluation

## 2021-05-17 NOTE — Op Note (Signed)
[facility] Patient Name: [name] Procedure Date: 05/17/2021 9:20 AM MRN: [id number] Account #: [id number] Date of Birth: Oct 10, 1945 Admit Type: Outpatient Age: 75 Room: [facility] ENDO ROOM 3 Gender: Male Note Status: Finalized Instrument Name: Jasper Riling [id number] Procedure:             Colonoscopy Indications:           Screening for colorectal malignant neoplasm Providers:             Darlena Koval B. Bonna Gains MD, MD Referring MD:          Leona Carry. Hall Busing, MD (Referring MD) Medicines:             Monitored Anesthesia Care Complications:         No immediate complications. Procedure:             Pre-Anesthesia Assessment:                        - ASA Grade Assessment: II - A patient with mild                         systemic disease.                        - Prior to the procedure, a History and Physical was                         performed, and patient medications, allergies and                         sensitivities were reviewed. The patient's tolerance                         of previous anesthesia was reviewed.                        - The risks and benefits of the procedure and the                         sedation options and risks were discussed with the                         patient. All questions were answered and informed                         consent was obtained.                        - Patient identification and proposed procedure were                         verified prior to the procedure by the physician, the                         nurse, the anesthesiologist, the anesthetist and the                         technician. The procedure was verified in the  procedure room.                        After obtaining informed consent, the colonoscope was                         passed under direct vision. Throughout the procedure,                         the patient's blood pressure, pulse, and oxygen                          saturations were monitored continuously. The                         Colonoscope was introduced through the anus and                         advanced to the the ileocolonic anastomosis. The                         colonoscopy was performed with ease. The patient                         tolerated the procedure well. The quality of the bowel                         preparation was good. Findings:      Skin tags were found on perianal exam.      The anastomosis appeared normal.      A 6 mm polyp was found in the transverse colon. The polyp was sessile.       The polyp was removed with a cold snare. Resection and retrieval were       complete.      A 4 mm polyp was found in the transverse colon. The polyp was sessile.       The polyp was removed with a jumbo cold forceps. Resection and retrieval       were complete.      The exam was otherwise without abnormality.      Non-bleeding internal hemorrhoids were found during retroflexion.      Anal papilla(e) were hypertrophied. Impression:            - The colonic anastomosis is normal.                        - One 6 mm polyp in the transverse colon, removed with                         a cold snare. Resected and retrieved.                        - One 4 mm polyp in the transverse colon, removed with                         a jumbo cold forceps. Resected and retrieved.                        - The examination was otherwise normal.                        -  Non-bleeding internal hemorrhoids.                        - Anal papilla(e) were hypertrophied. Recommendation:        - Await pathology results.                        - High fiber diet.                        - The large hypertrophied anal papillae was previously                         seen and referral to surgery for further evaluation vs                         EUA was recommended. It does not appear that pt has                         seen surgery, and we will re-refer.                         - Discharge patient to home (with escort).                        - Advance diet as tolerated.                        - Continue present medications.                        - Repeat colonoscopy date to be determined after                         pending pathology results are reviewed.                        - The findings and recommendations were discussed with                         the patient.                        - The findings and recommendations were discussed with                         the patient's family.                        - Return to primary care physician as previously                         scheduled. Procedure Code(s):     --- Professional ---                        [phone], Colonoscopy, flexible; with removal of                         tumor(s), polyp(s), or other lesion(s) by snare  technique                        45380, 59, Colonoscopy, flexible; with biopsy, single                         or multiple Diagnosis Code(s):     --- Professional ---                        Z12.11, Encounter for screening for malignant neoplasm                         of colon                        K63.5, Polyp of colon CPT copyright 2019 American Medical Association. All rights reserved. The codes documented in this report are preliminary and upon coder review may  be revised to meet current compliance requirements.  Vonda Antigua, MD Margretta Sidle B. Bonna Gains MD, MD 05/17/2021 10:21:55 AM This report has been signed electronically. Number of Addenda: 0 Note Initiated On: 05/17/2021 9:20 AM Scope Withdrawal Time: 0 hours 19 minutes 28 seconds  Total Procedure Duration: 0 hours 22 minutes 58 seconds  Estimated Blood Loss:  Estimated blood loss: none.      [facility]

## 2021-05-17 NOTE — Transfer of Care (Signed)
Immediate Anesthesia Transfer of Care Note  Patient: [name]  Procedure(s) Performed: COLONOSCOPY WITH PROPOFOL  Patient Location: PACU  Anesthesia Type:General  Level of Consciousness: awake and drowsy  Airway & Oxygen Therapy: Patient Spontanous Breathing  Post-op Assessment: Report given to RN and Post -op Vital signs reviewed and stable  Post vital signs: Reviewed and stable  Last Vitals:  Vitals Value Taken Time  BP 156/81 05/17/21 1013  Temp    Pulse 58 05/17/21 1013  Resp 19 05/17/21 1013  SpO2 97 % 05/17/21 1013  Vitals shown include unvalidated device data.  Last Pain:  Vitals:   05/17/21 0841  TempSrc: Temporal  PainSc: 0-No pain         Complications: No notable events documented.

## 2021-05-17 NOTE — H&P (Signed)
Vonda Antigua, MD [address] [place], [place], [place], [address] [address], [place], [place], [place], [address] Phone: [phone]  Fax: [phone]  Primary Care Physician:  Albina Billet, MD   Pre-Procedure History & Physical: HPI:  [name] is a 74 y.o. male is here for a colonoscopy.   Past Medical History:  Diagnosis Date   Anemia    Arthritis    Chronic kidney disease    patient not aware of this dx   Coronary artery disease    Diabetes mellitus without complication (HCC)    type 2   Dyspnea    GERD (gastroesophageal reflux disease)    History of blood transfusion    Hypertension    Sleep apnea    does not use cpap    Past Surgical History:  Procedure Laterality Date   APPENDECTOMY     CARDIAC CATHETERIZATION Left 08/16/2016   Procedure: Left Heart Cath and Coronary Angiography;  Surgeon: Yolonda Kida, MD;  Location: [facility];  Service: Cardiovascular;  Laterality: Left;   COLON SURGERY     part of colon removed   COLONOSCOPY N/A 09/07/2020   Procedure: COLONOSCOPY;  Surgeon: Virgel Manifold, MD;  Location: [facility];  Service: Endoscopy;  Laterality: N/A;   CORONARY ARTERY BYPASS GRAFT  08/30/2016   triple   CORONARY STENT INTERVENTION N/A 06/09/2020   Procedure: CORONARY STENT INTERVENTION;  Surgeon: Yolonda Kida, MD;  Location: [facility];  Service: Cardiovascular;  Laterality: N/A;   ENDOSCOPIC MUCOSAL RESECTION N/A 09/22/2020   Procedure: ENDOSCOPIC MUCOSAL RESECTION;  Surgeon: Rush Landmark Telford Nab., MD;  Location: [facility];  Service: Gastroenterology;  Laterality: N/A;   ESOPHAGOGASTRODUODENOSCOPY N/A 09/07/2020   Procedure: ESOPHAGOGASTRODUODENOSCOPY (EGD);  Surgeon: Virgel Manifold, MD;  Location: [facility];  Service: Endoscopy;  Laterality: N/A;   ESOPHAGOGASTRODUODENOSCOPY (EGD) WITH PROPOFOL N/A 09/22/2020   Procedure: ESOPHAGOGASTRODUODENOSCOPY (EGD) WITH PROPOFOL;   Surgeon: Rush Landmark Telford Nab., MD;  Location: [facility];  Service: Gastroenterology;  Laterality: N/A;   FRACTURE SURGERY     HEMOSTASIS CLIP PLACEMENT  09/22/2020   Procedure: HEMOSTASIS CLIP PLACEMENT;  Surgeon: Irving Copas., MD;  Location: [facility];  Service: Gastroenterology;;   HEMOSTASIS CONTROL  09/22/2020   Procedure: HEMOSTASIS CONTROL;  Surgeon: Irving Copas., MD;  Location: [facility];  Service: Gastroenterology;;   LEFT HEART CATH AND CORONARY ANGIOGRAPHY Left 06/09/2020   Procedure: LEFT HEART CATH AND CORONARY ANGIOGRAPHY;  Surgeon: Yolonda Kida, MD;  Location: [facility];  Service: Cardiovascular;  Laterality: Left;   LEFT HEART CATH AND CORS/GRAFTS ANGIOGRAPHY Left 11/20/2016   Procedure: Left Heart Cath and Cors/Grafts Angiography;  Surgeon: Yolonda Kida, MD;  Location: [facility];  Service: Cardiovascular;  Laterality: Left;   POLYPECTOMY  09/22/2020   Procedure: POLYPECTOMY;  Surgeon: Mansouraty, Telford Nab., MD;  Location: [facility];  Service: Gastroenterology;;   SUBMUCOSAL LIFTING INJECTION  09/22/2020   Procedure: SUBMUCOSAL LIFTING INJECTION;  Surgeon: Irving Copas., MD;  Location: [facility];  Service: Gastroenterology;;    Prior to Admission medications   Medication Sig Start Date End Date Taking? Authorizing Provider  hydrALAZINE (APRESOLINE) 25 MG tablet Take 25 mg by mouth 3 (three) times daily.   Yes [provider]  hydrochlorothiazide (HYDRODIURIL) 12.5 MG tablet Take 12.5 mg by mouth daily.   Yes [provider]  insulin glargine (LANTUS) 100 UNIT/ML injection Inject 25 Units into the skin at bedtime.  Yes [provider]  irbesartan (AVAPRO) 300 MG tablet Take 1 tablet (300 mg total) by mouth daily. 09/09/20  Yes Cherene Altes, MD  metFORMIN (GLUCOPHAGE-XR) 500 MG 24 hr tablet Take 500 mg by mouth 2 (two) times daily.   Yes [provider]   nebivolol (BYSTOLIC) 10 MG tablet Take 10 mg by mouth daily.  06/29/19  Yes [provider]  pantoprazole (PROTONIX) 20 MG tablet Take 1 tablet (20 mg total) by mouth daily. 02/16/21  Yes Lucilla Lame, MD  rosuvastatin (CRESTOR) 40 MG tablet Take 40 mg by mouth daily. 06/22/20 06/22/21 Yes [provider]  sitaGLIPtin (JANUVIA) 100 MG tablet Take 100 mg by mouth daily.   Yes [provider]  Testosterone 1.62 % GEL Apply 2 Pump topically daily. One pump on each arm 01/16/21  Yes McGowan, Larene Beach A, PA-C  triamcinolone (KENALOG) 0.025 % cream Apply 1 application topically 3 (three) times a week. 06/23/19  Yes [provider]  vitamin B-12 (CYANOCOBALAMIN) 1000 MCG tablet Take 1 tablet (1,000 mcg total) by mouth daily. 09/08/20  Yes Cherene Altes, MD  acetaminophen (TYLENOL) 325 MG tablet Take 2 tablets (650 mg total) by mouth every 6 (six) hours as needed for mild pain (or Fever >/= 101). 09/08/20   Cherene Altes, MD  amLODipine (NORVASC) 10 MG tablet Take 10 mg by mouth daily.    [provider]  aspirin EC 81 MG tablet Take 81 mg by mouth daily. Swallow whole.    [provider]  clopidogrel (PLAVIX) 75 MG tablet Take 75 mg by mouth daily.    [provider]    Allergies as of 04/21/2021   (No Known Allergies)    History reviewed. No pertinent family history.  Social History   Socioeconomic History   Marital status: Married    Spouse name: Not on file   Number of children: Not on file   Years of education: Not on file   Highest education level: Not on file  Occupational History   Not on file  Tobacco Use   Smoking status: Never   Smokeless tobacco: Never  Vaping Use   Vaping Use: Never used  Substance and Sexual Activity   Alcohol use: No   Drug use: No   Sexual activity: Yes  Other Topics Concern   Not on file  Social History Narrative   Not on file   Social Determinants of Health   Financial Resource  Strain: Not on file  Food Insecurity: Not on file  Transportation Needs: Not on file  Physical Activity: Not on file  Stress: Not on file  Social Connections: Not on file  Intimate Partner Violence: Not on file    Review of Systems: See HPI, otherwise negative ROS  Physical Exam: Constitutional: General:   Alert,  Well-developed, well-nourished, pleasant and cooperative in NAD BP (!) 208/98   Pulse 62   Temp (!) 96.8 F (36 C) (Temporal)   Resp 18   Ht 6\' 1"  (1.854 m)   Wt 117.3 kg   SpO2 98%   BMI 34.11 kg/m   Head: Normocephalic, atraumatic.   Eyes:  Sclera clear, no icterus.   Conjunctiva pink.   Mouth:  No deformity or lesions, oropharynx pink & moist.  Neck:  Supple, trachea midline  Respiratory: Normal respiratory effort  Gastrointestinal:  Soft, non-tender and non-distended without masses, hepatosplenomegaly or hernias noted.  No guarding or rebound tenderness.     Cardiac: No clubbing  or edema.  No cyanosis. Normal posterior tibial pedal pulses noted.  Lymphatic:  No significant cervical adenopathy.  Psych:  Alert and cooperative. Normal mood and affect.  Musculoskeletal:   Symmetrical without gross deformities. 5/5 Lower extremity strength bilaterally.  Skin: Warm. Intact without significant lesions or rashes. No jaundice.  Neurologic:  Face symmetrical, tongue midline, Normal sensation to touch;  grossly normal neurologically.  Psych:  Alert and oriented x3, Alert and cooperative. Normal mood and affect.  Impression/Plan: [name] is here for a colonoscopy to be performed for previous history of adenoma polyps and fair prep on last exam.  Risks, benefits, limitations, and alternatives regarding  colonoscopy have been reviewed with the patient.  Questions have been answered.  All parties agreeable.   Virgel Manifold, MD  05/17/2021, 9:28 AM

## 2021-05-17 NOTE — Anesthesia Postprocedure Evaluation (Signed)
Anesthesia Post Note  Patient: [name]  Procedure(s) Performed: COLONOSCOPY WITH PROPOFOL  Patient location during evaluation: Endoscopy Anesthesia Type: General Level of consciousness: awake and alert and oriented Pain management: pain level controlled Vital Signs Assessment: post-procedure vital signs reviewed and stable Respiratory status: spontaneous breathing, nonlabored ventilation and respiratory function stable Cardiovascular status: blood pressure returned to baseline and stable Postop Assessment: no signs of nausea or vomiting Anesthetic complications: no   No notable events documented.   Last Vitals:  Vitals:   05/17/21 1030 05/17/21 1040  BP: (!) 177/89 (!) 186/86  Pulse:    Resp:    Temp:    SpO2:      Last Pain:  Vitals:   05/17/21 1040  TempSrc:   PainSc: 0-No pain                 Lyam Provencio

## 2021-05-17 NOTE — Anesthesia Procedure Notes (Signed)
Date/Time: 05/17/2021 9:33 AM Performed by: Johnna Acosta, CRNA Pre-anesthesia Checklist: Patient identified, Emergency Drugs available, Suction available, Patient being monitored and Timeout performed Patient Re-evaluated:Patient Re-evaluated prior to induction Oxygen Delivery Method: Nasal cannula Preoxygenation: Pre-oxygenation with 100% oxygen Induction Type: IV induction

## 2021-05-18 ENCOUNTER — Encounter: Payer: Self-pay | Admitting: Gastroenterology

## 2021-05-18 LAB — SURGICAL PATHOLOGY

## 2021-05-22 ENCOUNTER — Telehealth: Payer: Self-pay

## 2021-05-22 ENCOUNTER — Ambulatory Visit: Payer: Medicare HMO

## 2021-05-22 NOTE — Telephone Encounter (Signed)
Referral form, procedure, progress notes, insurance card and demographics attached 

## 2021-05-23 ENCOUNTER — Inpatient Hospital Stay: Payer: Medicare HMO | Attending: Oncology

## 2021-05-23 ENCOUNTER — Other Ambulatory Visit: Payer: Self-pay

## 2021-05-23 VITALS — BP 171/66 | HR 62 | Temp 97.0°F | Resp 18

## 2021-05-23 DIAGNOSIS — D509 Iron deficiency anemia, unspecified: Secondary | ICD-10-CM | POA: Insufficient documentation

## 2021-05-23 DIAGNOSIS — D508 Other iron deficiency anemias: Secondary | ICD-10-CM

## 2021-05-23 MED ORDER — IRON SUCROSE 20 MG/ML IV SOLN
200.0000 mg | Freq: Once | INTRAVENOUS | Status: AC
  Administered 2021-05-23: 200 mg via INTRAVENOUS

## 2021-05-23 MED ORDER — SODIUM CHLORIDE 0.9 % IV SOLN
Freq: Once | INTRAVENOUS | Status: AC
  Filled 2021-05-23: qty 250

## 2021-05-23 MED ORDER — SODIUM CHLORIDE 0.9 % IV SOLN: 200.0000 mg | INTRAVENOUS | Status: DC

## 2021-05-25 ENCOUNTER — Other Ambulatory Visit: Payer: Self-pay

## 2021-05-25 ENCOUNTER — Inpatient Hospital Stay: Payer: Medicare HMO

## 2021-05-25 VITALS — BP 157/71 | HR 63 | Temp 96.5°F | Resp 18

## 2021-05-25 DIAGNOSIS — D508 Other iron deficiency anemias: Secondary | ICD-10-CM

## 2021-05-25 DIAGNOSIS — D509 Iron deficiency anemia, unspecified: Secondary | ICD-10-CM | POA: Diagnosis not present

## 2021-05-25 MED ORDER — SODIUM CHLORIDE 0.9 % IV SOLN: 200.0000 mg | INTRAVENOUS | Status: DC

## 2021-05-25 MED ORDER — SODIUM CHLORIDE 0.9 % IV SOLN
Freq: Once | INTRAVENOUS | Status: AC
  Filled 2021-05-25: qty 250

## 2021-05-25 MED ORDER — IRON SUCROSE 20 MG/ML IV SOLN
200.0000 mg | Freq: Once | INTRAVENOUS | Status: AC
  Administered 2021-05-25: 200 mg via INTRAVENOUS

## 2021-05-30 ENCOUNTER — Other Ambulatory Visit: Payer: Self-pay

## 2021-05-30 ENCOUNTER — Inpatient Hospital Stay: Payer: Medicare HMO

## 2021-05-30 VITALS — BP 163/82 | HR 56 | Temp 97.0°F | Resp 18

## 2021-05-30 DIAGNOSIS — D509 Iron deficiency anemia, unspecified: Secondary | ICD-10-CM | POA: Diagnosis not present

## 2021-05-30 DIAGNOSIS — D508 Other iron deficiency anemias: Secondary | ICD-10-CM

## 2021-05-30 MED ORDER — SODIUM CHLORIDE 0.9 % IV SOLN: 200.0000 mg | INTRAVENOUS | Status: DC

## 2021-05-30 MED ORDER — IRON SUCROSE 20 MG/ML IV SOLN
200.0000 mg | Freq: Once | INTRAVENOUS | Status: AC
  Administered 2021-05-30: 200 mg via INTRAVENOUS
  Filled 2021-05-30: qty 10

## 2021-05-30 MED ORDER — SODIUM CHLORIDE 0.9 % IV SOLN
Freq: Once | INTRAVENOUS | Status: AC
  Filled 2021-05-30: qty 250

## 2021-06-01 ENCOUNTER — Other Ambulatory Visit: Payer: Self-pay

## 2021-06-01 ENCOUNTER — Inpatient Hospital Stay: Payer: Medicare HMO

## 2021-06-01 VITALS — BP 175/75 | HR 58 | Temp 97.0°F | Resp 18

## 2021-06-01 DIAGNOSIS — D509 Iron deficiency anemia, unspecified: Secondary | ICD-10-CM | POA: Diagnosis not present

## 2021-06-01 DIAGNOSIS — D508 Other iron deficiency anemias: Secondary | ICD-10-CM

## 2021-06-01 MED ORDER — SODIUM CHLORIDE 0.9 % IV SOLN: 200.0000 mg | INTRAVENOUS | Status: DC

## 2021-06-01 MED ORDER — SODIUM CHLORIDE 0.9 % IV SOLN
Freq: Once | INTRAVENOUS | Status: AC
  Filled 2021-06-01: qty 250

## 2021-06-01 MED ORDER — IRON SUCROSE 20 MG/ML IV SOLN
200.0000 mg | Freq: Once | INTRAVENOUS | Status: AC
Start: 2021-06-01 — End: 2021-06-01
  Administered 2021-06-01: 200 mg via INTRAVENOUS
  Filled 2021-06-01: qty 10

## 2021-06-06 ENCOUNTER — Other Ambulatory Visit: Payer: Self-pay

## 2021-06-06 ENCOUNTER — Inpatient Hospital Stay: Payer: Medicare HMO

## 2021-06-06 VITALS — BP 147/73 | HR 75 | Temp 97.6°F | Resp 18

## 2021-06-06 DIAGNOSIS — D509 Iron deficiency anemia, unspecified: Secondary | ICD-10-CM | POA: Diagnosis not present

## 2021-06-06 DIAGNOSIS — D508 Other iron deficiency anemias: Secondary | ICD-10-CM

## 2021-06-06 MED ORDER — SODIUM CHLORIDE 0.9 % IV SOLN
Freq: Once | INTRAVENOUS | Status: AC
  Filled 2021-06-06: qty 250

## 2021-06-06 MED ORDER — SODIUM CHLORIDE 0.9 % IV SOLN: 200.0000 mg | INTRAVENOUS | Status: DC

## 2021-06-06 MED ORDER — IRON SUCROSE 20 MG/ML IV SOLN
200.0000 mg | Freq: Once | INTRAVENOUS | Status: AC
  Administered 2021-06-06: 200 mg via INTRAVENOUS

## 2021-06-13 ENCOUNTER — Telehealth: Payer: Self-pay | Admitting: Gastroenterology

## 2021-07-03 ENCOUNTER — Ambulatory Visit: Payer: Self-pay | Admitting: Surgery

## 2021-07-14 ENCOUNTER — Telehealth: Payer: Self-pay | Admitting: Urology

## 2021-07-14 DIAGNOSIS — E349 Endocrine disorder, unspecified: Secondary | ICD-10-CM

## 2021-07-14 DIAGNOSIS — Z8639 Personal history of other endocrine, nutritional and metabolic disease: Secondary | ICD-10-CM

## 2021-07-14 DIAGNOSIS — N138 Other obstructive and reflux uropathy: Secondary | ICD-10-CM

## 2021-07-14 NOTE — Telephone Encounter (Signed)
Labs ordered per last OV, pt notified

## 2021-07-14 NOTE — Telephone Encounter (Signed)
Patient has an appointment with [name] on 07/24/21.  He called asking if he needs to have lab work done prior to his appt

## 2021-07-17 ENCOUNTER — Other Ambulatory Visit
Admission: RE | Admit: 2021-07-17 | Discharge: 2021-07-17 | Disposition: A | Discharge status: Home / Self Care | Payer: Medicare HMO | Attending: Urology | Admitting: Urology

## 2021-07-17 ENCOUNTER — Other Ambulatory Visit: Payer: Self-pay

## 2021-07-17 DIAGNOSIS — N401 Enlarged prostate with lower urinary tract symptoms: Secondary | ICD-10-CM | POA: Diagnosis present

## 2021-07-17 DIAGNOSIS — E349 Endocrine disorder, unspecified: Secondary | ICD-10-CM | POA: Insufficient documentation

## 2021-07-17 DIAGNOSIS — Z8639 Personal history of other endocrine, nutritional and metabolic disease: Secondary | ICD-10-CM | POA: Insufficient documentation

## 2021-07-17 DIAGNOSIS — N138 Other obstructive and reflux uropathy: Secondary | ICD-10-CM | POA: Insufficient documentation

## 2021-07-17 LAB — HEMOGLOBIN AND HEMATOCRIT, BLOOD
HCT: 35.5 % — ABNORMAL LOW (ref 39.0–52.0)
Hemoglobin: 11.3 g/dL — ABNORMAL LOW (ref 13.0–17.0)

## 2021-07-17 LAB — PSA: Prostatic Specific Antigen: 0.35 ng/mL (ref 0.00–4.00)

## 2021-07-18 LAB — TESTOSTERONE: Testosterone: 189 ng/dL — ABNORMAL LOW (ref 264–916)

## 2021-07-23 NOTE — Progress Notes (Signed)
07/24/2021 10:15 AM   [name] Sep 05, 1945 [id number]  Referring provider: Albina Billet, MD [address]   [place],  [place] [address]  Chief Complaint  Patient presents with   Follow-up    27mth follow-up   Urological history: 1. Testosterone deficiency -contributing factors of age, obesity, diabetes and CKD -testosterone 189 in 07/2021  -H & H stable, but anemic in 07/2021 -managed with testosterone gel 1.62%, 2 pumps daily  2. BPH with LU TS -PSA 0.35 in 07/2021 -I PSS 13/3 -PVR 38 mL   3. ED -contributing factors of age, BPH, testosterone deficiency, DM, HTN, HLD, sleep apnea and CAD  4. Sleep apnea -untreated   5. Urethroplasty as a child   HPI: [name] is a 75 y.o. male who presents today for follow up.  He states he is not applying the testosterone gel in a consistent manner.  He continues to have issues with urinary frequency, but he states it is not bothersome.  Patient denies any modifying or aggravating factors.  Patient denies any gross hematuria, dysuria or suprapubic/flank pain.  Patient denies any fevers, chills, nausea or vomiting.      IPSS     Row Name 07/24/21 1000         International Prostate Symptom Score   How often have you had the sensation of not emptying your bladder? Less than half the time     How often have you had to urinate less than every two hours? About half the time     How often have you found you stopped and started again several times when you urinated? Less than half the time     How often have you found it difficult to postpone urination? Less than 1 in 5 times     How often have you had a weak urinary stream? Less than 1 in 5 times     How often have you had to strain to start urination? Less than 1 in 5 times     How many times did you typically get up at night to urinate? 3 Times     Total IPSS Score 13       Quality of Life due to urinary symptoms   If you were to spend the rest of your  life with your urinary condition just the way it is now how would you feel about that? Mixed               Score:  1-7 Mild 8-19 Moderate 20-35 Severe   PMH: Past Medical History:  Diagnosis Date   Anemia    Arthritis    Chronic kidney disease    patient not aware of this dx   Coronary artery disease    Diabetes mellitus without complication (HCC)    type 2   Dyspnea    GERD (gastroesophageal reflux disease)    History of blood transfusion    Hypertension    Sleep apnea    does not use cpap    Surgical History: Past Surgical History:  Procedure Laterality Date   APPENDECTOMY     CARDIAC CATHETERIZATION Left 08/16/2016   Procedure: Left Heart Cath and Coronary Angiography;  Surgeon: Yolonda Kida, MD;  Location: [facility];  Service: Cardiovascular;  Laterality: Left;   COLON SURGERY     part of colon removed   COLONOSCOPY N/A 09/07/2020   Procedure: COLONOSCOPY;  Surgeon: Virgel Manifold, MD;  Location: [facility];  Service: Endoscopy;  Laterality: N/A;   COLONOSCOPY WITH PROPOFOL N/A 05/17/2021   Procedure: COLONOSCOPY WITH PROPOFOL;  Surgeon: Virgel Manifold, MD;  Location: [facility];  Service: Endoscopy;  Laterality: N/A;   CORONARY ARTERY BYPASS GRAFT  08/30/2016   triple   CORONARY STENT INTERVENTION N/A 06/09/2020   Procedure: CORONARY STENT INTERVENTION;  Surgeon: Yolonda Kida, MD;  Location: [facility];  Service: Cardiovascular;  Laterality: N/A;   ENDOSCOPIC MUCOSAL RESECTION N/A 09/22/2020   Procedure: ENDOSCOPIC MUCOSAL RESECTION;  Surgeon: Rush Landmark Telford Nab., MD;  Location: [facility];  Service: Gastroenterology;  Laterality: N/A;   ESOPHAGOGASTRODUODENOSCOPY N/A 09/07/2020   Procedure: ESOPHAGOGASTRODUODENOSCOPY (EGD);  Surgeon: Virgel Manifold, MD;  Location: [facility];  Service: Endoscopy;  Laterality: N/A;   ESOPHAGOGASTRODUODENOSCOPY (EGD) WITH PROPOFOL N/A 09/22/2020   Procedure:  ESOPHAGOGASTRODUODENOSCOPY (EGD) WITH PROPOFOL;  Surgeon: Rush Landmark Telford Nab., MD;  Location: [facility];  Service: Gastroenterology;  Laterality: N/A;   FRACTURE SURGERY     HEMOSTASIS CLIP PLACEMENT  09/22/2020   Procedure: HEMOSTASIS CLIP PLACEMENT;  Surgeon: Irving Copas., MD;  Location: [facility];  Service: Gastroenterology;;   HEMOSTASIS CONTROL  09/22/2020   Procedure: HEMOSTASIS CONTROL;  Surgeon: Irving Copas., MD;  Location: [facility];  Service: Gastroenterology;;   LEFT HEART CATH AND CORONARY ANGIOGRAPHY Left 06/09/2020   Procedure: LEFT HEART CATH AND CORONARY ANGIOGRAPHY;  Surgeon: Yolonda Kida, MD;  Location: [facility];  Service: Cardiovascular;  Laterality: Left;   LEFT HEART CATH AND CORS/GRAFTS ANGIOGRAPHY Left 11/20/2016   Procedure: Left Heart Cath and Cors/Grafts Angiography;  Surgeon: Yolonda Kida, MD;  Location: [facility];  Service: Cardiovascular;  Laterality: Left;   POLYPECTOMY  09/22/2020   Procedure: POLYPECTOMY;  Surgeon: Mansouraty, Telford Nab., MD;  Location: [facility];  Service: Gastroenterology;;   SUBMUCOSAL LIFTING INJECTION  09/22/2020   Procedure: SUBMUCOSAL LIFTING INJECTION;  Surgeon: Irving Copas., MD;  Location: [facility];  Service: Gastroenterology;;    Home Medications:  Allergies as of 07/24/2021   No Known Allergies      Medication List        Accurate as of July 24, 2021 10:15 AM. If you have any questions, ask your nurse or doctor.          STOP taking these medications    nebivolol 10 MG tablet Commonly known as: BYSTOLIC Stopped by: Malaki Koury, PA-C   triamcinolone 0.025 % cream Commonly known as: KENALOG Stopped by: Gesselle Fitzsimons, PA-C       TAKE these medications    acetaminophen 325 MG tablet Commonly known as: TYLENOL Take 2 tablets (650 mg total) by mouth every 6 (six) hours as needed for mild pain (or Fever >/= 101).    amLODipine 10 MG tablet Commonly known as: NORVASC Take 10 mg by mouth daily.   aspirin EC 81 MG tablet Take 81 mg by mouth daily. Swallow whole.   clopidogrel 75 MG tablet Commonly known as: PLAVIX Take 75 mg by mouth daily.   hydrALAZINE 50 MG tablet Commonly known as: APRESOLINE Take 1 tablet by mouth 3 (three) times daily. What changed: Another medication with the same name was removed. Continue taking this medication, and follow the directions you see here. Changed by: Zara Council, PA-C   hydrochlorothiazide 12.5 MG tablet Commonly known as: HYDRODIURIL Take 12.5 mg by mouth daily.   insulin glargine 100 UNIT/ML injection Commonly known as: LANTUS Inject 25 Units  into the skin at bedtime.   irbesartan 300 MG tablet Commonly known as: AVAPRO Take 1 tablet (300 mg total) by mouth daily.   isosorbide mononitrate 30 MG 24 hr tablet Commonly known as: IMDUR Take 1 tablet by mouth daily.   labetalol 100 MG tablet Commonly known as: NORMODYNE Take 1 tablet by mouth 2 (two) times daily.   metFORMIN 500 MG 24 hr tablet Commonly known as: GLUCOPHAGE-XR Take 500 mg by mouth 2 (two) times daily.   pantoprazole 20 MG tablet Commonly known as: Protonix Take 1 tablet (20 mg total) by mouth daily.   rosuvastatin 40 MG tablet Commonly known as: CRESTOR Take 40 mg by mouth daily.   sitaGLIPtin 100 MG tablet Commonly known as: JANUVIA Take 100 mg by mouth daily.   Testosterone 1.62 % Gel Apply 2 Pump topically daily. One pump on each arm   vitamin B-12 1000 MCG tablet Commonly known as: CYANOCOBALAMIN Take 1 tablet (1,000 mcg total) by mouth daily.        Allergies:  No Known Allergies  Family History: No family history on file.  Social History:  reports that he has never smoked. He has never used smokeless tobacco. He reports that he does not drink alcohol and does not use drugs.  ROS: Pertinent ROS in HPI  Physical Exam: BP (!) 162/77   Pulse 79    Ht 6\' 1"  (1.854 m)   Wt 262 lb (118.8 kg)   BMI 34.57 kg/m   Constitutional:  Well nourished. Alert and oriented, No acute distress. HEENT: [place] AT, mask in place.  Trachea midline Cardiovascular: No clubbing, cyanosis, or edema. Respiratory: Normal respiratory effort, no increased work of breathing. Neurologic: Grossly intact, no focal deficits, moving all 4 extremities. Psychiatric: Normal mood and affect.   Laboratory Data: Lab Results  Component Value Date   WBC 5.8 05/09/2021   HGB 11.3 (L) 07/17/2021   HCT 35.5 (L) 07/17/2021   MCV 91.5 05/09/2021   PLT 125 (L) 05/09/2021    Lab Results  Component Value Date   CREATININE 1.19 09/08/2020    Lab Results  Component Value Date   TESTOSTERONE 189 (L) 07/17/2021    Lab Results  Component Value Date   AST 24 09/08/2020   Lab Results  Component Value Date   ALT 20 09/08/2020   Component     Latest Ref Rng & Units 08/25/2019 03/04/2020 09/02/2020 01/13/2021  Prostatic Specific Antigen     0.00 - 4.00 ng/mL 0.40 0.49 0.43 0.69   Component     Latest Ref Rng & Units 07/17/2021  Prostatic Specific Antigen     0.00 - 4.00 ng/mL 0.35  I  have reviewed the labs.   Pertinent Imaging:  07/24/21 10:03  Scan Result 19ml      Assessment & Plan:    1. Testosterone deficiency  -testosterone level is not therapeutic -Continue AndroGel 1.62%, 2 pumps daily making sure it is applied daily  2. BPH with LUTS -PSA stable -PVR < 300 cc -symptoms - frequency  -continue conservative management, avoiding bladder irritants and timed voiding's   3. Anemia -followed by GI and hematology   Return in about 6 months (around 01/22/2022) for PSA, testosterone, H & H , IPSS, exam and PVR- labs prior .  These notes generated with voice recognition software. I apologize for typographical errors.  Zara Council, PA-C  [facility] [address]  [place], [place] [address] [phone]

## 2021-07-24 ENCOUNTER — Ambulatory Visit (INDEPENDENT_AMBULATORY_CARE_PROVIDER_SITE_OTHER): Payer: Medicare HMO | Admitting: Urology

## 2021-07-24 ENCOUNTER — Encounter: Payer: Self-pay | Admitting: Urology

## 2021-07-24 ENCOUNTER — Other Ambulatory Visit: Payer: Self-pay

## 2021-07-24 VITALS — BP 162/77 | HR 79 | Ht 73.0 in | Wt 262.0 lb

## 2021-07-24 DIAGNOSIS — D649 Anemia, unspecified: Secondary | ICD-10-CM | POA: Diagnosis not present

## 2021-07-24 DIAGNOSIS — N401 Enlarged prostate with lower urinary tract symptoms: Secondary | ICD-10-CM | POA: Diagnosis not present

## 2021-07-24 DIAGNOSIS — N138 Other obstructive and reflux uropathy: Secondary | ICD-10-CM

## 2021-07-24 DIAGNOSIS — E349 Endocrine disorder, unspecified: Secondary | ICD-10-CM

## 2021-07-24 LAB — BLADDER SCAN AMB NON-IMAGING

## 2021-08-08 ENCOUNTER — Inpatient Hospital Stay: Payer: Medicare HMO | Attending: Oncology

## 2021-08-08 ENCOUNTER — Telehealth: Payer: Medicare HMO | Admitting: Oncology

## 2021-08-08 ENCOUNTER — Other Ambulatory Visit: Payer: Medicare HMO

## 2021-08-08 ENCOUNTER — Other Ambulatory Visit: Payer: Self-pay

## 2021-08-08 ENCOUNTER — Inpatient Hospital Stay: Payer: Medicare HMO

## 2021-08-08 DIAGNOSIS — D509 Iron deficiency anemia, unspecified: Secondary | ICD-10-CM | POA: Insufficient documentation

## 2021-08-08 LAB — IRON AND TIBC
Iron: 56 ug/dL (ref 45–182)
Saturation Ratios: 18 % (ref 17.9–39.5)
TIBC: 319 ug/dL (ref 250–450)
UIBC: 263 ug/dL

## 2021-08-08 LAB — CBC WITH DIFFERENTIAL/PLATELET
Abs Immature Granulocytes: 0.01 10*3/uL (ref 0.00–0.07)
Basophils Absolute: 0 10*3/uL (ref 0.0–0.1)
Basophils Relative: 1 %
Eosinophils Absolute: 0.1 10*3/uL (ref 0.0–0.5)
Eosinophils Relative: 2 %
HCT: 37.3 % — ABNORMAL LOW (ref 39.0–52.0)
Hemoglobin: 11.8 g/dL — ABNORMAL LOW (ref 13.0–17.0)
Immature Granulocytes: 0 %
Lymphocytes Relative: 9 %
Lymphs Abs: 0.6 10*3/uL — ABNORMAL LOW (ref 0.7–4.0)
MCH: 30.2 pg (ref 26.0–34.0)
MCHC: 31.6 g/dL (ref 30.0–36.0)
MCV: 95.4 fL (ref 80.0–100.0)
Monocytes Absolute: 0.4 10*3/uL (ref 0.1–1.0)
Monocytes Relative: 7 %
Neutro Abs: 4.9 10*3/uL (ref 1.7–7.7)
Neutrophils Relative %: 81 %
Platelets: 133 10*3/uL — ABNORMAL LOW (ref 150–400)
RBC: 3.91 MIL/uL — ABNORMAL LOW (ref 4.22–5.81)
RDW: 15.9 % — ABNORMAL HIGH (ref 11.5–15.5)
WBC: 6.1 10*3/uL (ref 4.0–10.5)
nRBC: 0 % (ref 0.0–0.2)

## 2021-08-08 LAB — FERRITIN: Ferritin: 87 ng/mL (ref 24–336)

## 2021-08-16 ENCOUNTER — Encounter: Payer: Self-pay | Admitting: Oncology

## 2021-08-16 ENCOUNTER — Other Ambulatory Visit: Payer: Self-pay

## 2021-08-16 ENCOUNTER — Inpatient Hospital Stay: Payer: Medicare HMO | Attending: Oncology | Admitting: Oncology

## 2021-08-16 ENCOUNTER — Telehealth: Payer: Medicare HMO | Admitting: Oncology

## 2021-08-16 VITALS — BP 173/89 | HR 70 | Temp 97.5°F | Resp 18 | Wt 260.0 lb

## 2021-08-16 DIAGNOSIS — D509 Iron deficiency anemia, unspecified: Secondary | ICD-10-CM | POA: Diagnosis present

## 2021-08-16 DIAGNOSIS — D508 Other iron deficiency anemias: Secondary | ICD-10-CM

## 2021-08-16 NOTE — Progress Notes (Signed)
Hematology/Oncology Consult note [facility]  Telephone:([phone] [phone]  Patient Care Team: Albina Billet, MD as PCP - General (Internal Medicine) Michael Boston, MD as Consulting Physician (General Surgery) Virgel Manifold, MD as Consulting Physician (Gastroenterology) Yolonda Kida, MD as Consulting Physician (Cardiology)   Name of the patient: [name]  [id number]  07/24/1946   Date of visit: 08/16/21  Diagnosis-iron deficiency anemia  Chief complaint/ Reason for visit-routine follow-up of iron deficiency anemia  Heme/Onc history: Patient is a 76 year old male with history of multiple gastric polyps who was seen by both Dr. Rush Landmark and Dr. Bonna Gains.  He has undergone EGD with Dr. Rush Landmark and had a large gastric hyperplastic polyp removal.  Prior to that patient had significant iron deficiency and his aspirin and Plavix were on hold.  These were restarted after EGD.  Most recent CBC on 11/01/2020 showed white count of 6.8, H&H of 11.3/36.4 with an MCV of 85 and a platelet count of 158.  Iron studies were normal.  Patient denies any bleeding in his stool or dark melanotic stools.  Patient received 5 doses of Venofer in October 2022 and his hemoglobin improved from 7-12  Interval history-patient states that ever since he had his gastric polyp removed he has symptoms of intermittent nausea and queasiness.  Denies any blood loss in his stool or urine.  Denies any dark melanotic stools.  He is not presently taking any oral iron  ECOG PS- 1 Pain scale- 0   Review of systems- Review of Systems  Constitutional:  Negative for chills, fever, malaise/fatigue and weight loss.  HENT:  Negative for congestion, ear discharge and nosebleeds.   Eyes:  Negative for blurred vision.  Respiratory:  Negative for cough, hemoptysis, sputum production, shortness of breath and wheezing.   Cardiovascular:  Negative for chest pain,  palpitations, orthopnea and claudication.  Gastrointestinal:  Positive for nausea. Negative for abdominal pain, blood in stool, constipation, diarrhea, heartburn, melena and vomiting.  Genitourinary:  Negative for dysuria, flank pain, frequency, hematuria and urgency.  Musculoskeletal:  Negative for back pain, joint pain and myalgias.  Skin:  Negative for rash.  Neurological:  Negative for dizziness, tingling, focal weakness, seizures, weakness and headaches.  Endo/Heme/Allergies:  Does not bruise/bleed easily.  Psychiatric/Behavioral:  Negative for depression and suicidal ideas. The patient does not have insomnia.      No Known Allergies   Past Medical History:  Diagnosis Date   Anemia    Arthritis    Chronic kidney disease    patient not aware of this dx   Coronary artery disease    Diabetes mellitus without complication (HCC)    type 2   Dyspnea    GERD (gastroesophageal reflux disease)    History of blood transfusion    Hypertension    Sleep apnea    does not use cpap     Past Surgical History:  Procedure Laterality Date   APPENDECTOMY     CARDIAC CATHETERIZATION Left 08/16/2016   Procedure: Left Heart Cath and Coronary Angiography;  Surgeon: Yolonda Kida, MD;  Location: [place] [facility];  Service: Cardiovascular;  Laterality: Left;   COLON SURGERY     part of colon removed   COLONOSCOPY N/A 09/07/2020   Procedure: COLONOSCOPY;  Surgeon: Virgel Manifold, MD;  Location: [facility] ENDOSCOPY;  Service: Endoscopy;  Laterality: N/A;   COLONOSCOPY WITH PROPOFOL N/A 05/17/2021   Procedure: COLONOSCOPY WITH PROPOFOL;  Surgeon: Virgel Manifold,  MD;  Location: [facility] ENDOSCOPY;  Service: Endoscopy;  Laterality: N/A;   CORONARY ARTERY BYPASS GRAFT  08/30/2016   triple   CORONARY STENT INTERVENTION N/A 06/09/2020   Procedure: CORONARY STENT INTERVENTION;  Surgeon: Yolonda Kida, MD;  Location: [place] [facility];  Service: Cardiovascular;  Laterality: N/A;    ENDOSCOPIC MUCOSAL RESECTION N/A 09/22/2020   Procedure: ENDOSCOPIC MUCOSAL RESECTION;  Surgeon: Rush Landmark Telford Nab., MD;  Location: [place];  Service: Gastroenterology;  Laterality: N/A;   ESOPHAGOGASTRODUODENOSCOPY N/A 09/07/2020   Procedure: ESOPHAGOGASTRODUODENOSCOPY (EGD);  Surgeon: Virgel Manifold, MD;  Location: [facility] ENDOSCOPY;  Service: Endoscopy;  Laterality: N/A;   ESOPHAGOGASTRODUODENOSCOPY (EGD) WITH PROPOFOL N/A 09/22/2020   Procedure: ESOPHAGOGASTRODUODENOSCOPY (EGD) WITH PROPOFOL;  Surgeon: Rush Landmark Telford Nab., MD;  Location: [place];  Service: Gastroenterology;  Laterality: N/A;   FRACTURE SURGERY     HEMOSTASIS CLIP PLACEMENT  09/22/2020   Procedure: HEMOSTASIS CLIP PLACEMENT;  Surgeon: Irving Copas., MD;  Location: [place];  Service: Gastroenterology;;   HEMOSTASIS CONTROL  09/22/2020   Procedure: HEMOSTASIS CONTROL;  Surgeon: Irving Copas., MD;  Location: [place];  Service: Gastroenterology;;   LEFT HEART CATH AND CORONARY ANGIOGRAPHY Left 06/09/2020   Procedure: LEFT HEART CATH AND CORONARY ANGIOGRAPHY;  Surgeon: Yolonda Kida, MD;  Location: [place] [facility];  Service: Cardiovascular;  Laterality: Left;   LEFT HEART CATH AND CORS/GRAFTS ANGIOGRAPHY Left 11/20/2016   Procedure: Left Heart Cath and Cors/Grafts Angiography;  Surgeon: Yolonda Kida, MD;  Location: [place] [facility];  Service: Cardiovascular;  Laterality: Left;   POLYPECTOMY  09/22/2020   Procedure: POLYPECTOMY;  Surgeon: Mansouraty, Telford Nab., MD;  Location: [place];  Service: Gastroenterology;;   SUBMUCOSAL LIFTING INJECTION  09/22/2020   Procedure: SUBMUCOSAL LIFTING INJECTION;  Surgeon: Irving Copas., MD;  Location: [facility] ENDOSCOPY;  Service: Gastroenterology;;    Social History   Socioeconomic History   Marital status: Married    Spouse name: Not on file   Number of children: Not on file   Years of education: Not on file    Highest education level: Not on file  Occupational History   Not on file  Tobacco Use   Smoking status: Never   Smokeless tobacco: Never  Vaping Use   Vaping Use: Never used  Substance and Sexual Activity   Alcohol use: No   Drug use: No   Sexual activity: Yes  Other Topics Concern   Not on file  Social History Narrative   Not on file   Social Determinants of Health   Financial Resource Strain: Not on file  Food Insecurity: Not on file  Transportation Needs: Not on file  Physical Activity: Not on file  Stress: Not on file  Social Connections: Not on file  Intimate Partner Violence: Not on file    History reviewed. No pertinent family history.   Current Outpatient Medications:    acetaminophen (TYLENOL) 325 MG tablet, Take 2 tablets (650 mg total) by mouth every 6 (six) hours as needed for mild pain (or Fever >/= 101)., Disp: , Rfl:    amLODipine (NORVASC) 10 MG tablet, Take 10 mg by mouth daily., Disp: , Rfl:    aspirin EC 81 MG tablet, Take 81 mg by mouth daily. Swallow whole., Disp: , Rfl:    clopidogrel (PLAVIX) 75 MG tablet, Take 75 mg by mouth daily., Disp: , Rfl:    hydrALAZINE (APRESOLINE) 50 MG tablet, Take 1 tablet by mouth 3 (three) times daily., Disp: ,  Rfl:    hydrochlorothiazide (HYDRODIURIL) 12.5 MG tablet, Take 12.5 mg by mouth daily., Disp: , Rfl:    insulin glargine (LANTUS) 100 UNIT/ML injection, Inject 25 Units into the skin at bedtime. , Disp: , Rfl:    irbesartan (AVAPRO) 300 MG tablet, Take 1 tablet (300 mg total) by mouth daily., Disp: 30 tablet, Rfl: 1   isosorbide mononitrate (IMDUR) 30 MG 24 hr tablet, Take 1 tablet by mouth daily., Disp: , Rfl:    labetalol (NORMODYNE) 100 MG tablet, Take 1 tablet by mouth 2 (two) times daily., Disp: , Rfl:    metFORMIN (GLUCOPHAGE-XR) 500 MG 24 hr tablet, Take 500 mg by mouth 2 (two) times daily., Disp: , Rfl:    nebivolol (BYSTOLIC) 10 MG tablet, Take 10 mg by mouth daily., Disp: , Rfl:    pantoprazole  (PROTONIX) 20 MG tablet, Take 1 tablet (20 mg total) by mouth daily., Disp: 90 tablet, Rfl: 3   rosuvastatin (CRESTOR) 40 MG tablet, Take 40 mg by mouth daily., Disp: , Rfl:    sitaGLIPtin (JANUVIA) 100 MG tablet, Take 100 mg by mouth daily., Disp: , Rfl:    Testosterone 1.62 % GEL, Apply 2 Pump topically daily. One pump on each arm, Disp: 225 g, Rfl: 3   vitamin B-12 (CYANOCOBALAMIN) 1000 MCG tablet, Take 1 tablet (1,000 mcg total) by mouth daily., Disp: 30 tablet, Rfl: 0  Physical exam:  Vitals:   08/16/21 1005  BP: (!) 173/89  Pulse: 70  Resp: 18  Temp: (!) 97.5 F (36.4 C)  SpO2: 97%  Weight: 260 lb (117.9 kg)   Physical Exam Constitutional:      General: He is not in acute distress. Cardiovascular:     Rate and Rhythm: Normal rate and regular rhythm.     Heart sounds: Normal heart sounds.  Pulmonary:     Effort: Pulmonary effort is normal.  Skin:    General: Skin is warm and dry.  Neurological:     Mental Status: He is alert and oriented to person, place, and time.     CMP Latest Ref Rng & Units 09/08/2020  Glucose 70 - 99 mg/dL 214(H)  BUN 8 - 23 mg/dL 12  Creatinine 0.61 - 1.24 mg/dL 1.19  Sodium 135 - 145 mmol/L 139  Potassium 3.5 - 5.1 mmol/L 3.5  Chloride 98 - 111 mmol/L 104  CO2 22 - 32 mmol/L 26  Calcium 8.9 - 10.3 mg/dL 9.8  Total Protein 6.5 - 8.1 g/dL 6.4(L)  Total Bilirubin 0.3 - 1.2 mg/dL 0.4  Alkaline Phos 38 - 126 U/L 88  AST 15 - 41 U/L 24  ALT 0 - 44 U/L 20   CBC Latest Ref Rng & Units 08/08/2021  WBC 4.0 - 10.5 K/uL 6.1  Hemoglobin 13.0 - 17.0 g/dL 11.8(L)  Hematocrit 39.0 - 52.0 % 37.3(L)  Platelets 150 - 400 K/uL 133(L)    Assessment and plan- Patient is a 76 y.o. male with iron deficiency anemia here for routine follow-up  Patient isMildly anemic presently with a hemoglobin of 11.8 but his overall hemoglobin is remained stable between 11-12 for the last 9 months.  Ferritin is normal at 87 and iron studies showed normal TIBC and iron  saturation of 18%.  He does not require any IV iron at this time.  Repeat CBC ferritin and iron studies in 6 months along with B12 and folate and I will see him thereafter.  He will hold off on taking any oral iron at  this time.  With regards to his nausea of asked him to get in touch with Dr. Hall Busing who he would be seen in 2 days time to see if he needs to be started on any nausea medications versus GI follow-up   Visit Diagnosis 1. Other iron deficiency anemia      Dr. Randa Evens, MD, MPH [facility] [phone] 08/16/2021 12:47 PM

## 2021-08-16 NOTE — Progress Notes (Signed)
Patient here for oncology follow-up appointment, concerns of stomach pain

## 2021-09-08 ENCOUNTER — Encounter (HOSPITAL_BASED_OUTPATIENT_CLINIC_OR_DEPARTMENT_OTHER): Payer: Self-pay | Admitting: Surgery

## 2021-09-08 ENCOUNTER — Other Ambulatory Visit: Payer: Self-pay

## 2021-09-08 NOTE — Progress Notes (Addendum)
Spoke w/ via phone for pre-op interview---patient Lab needs dos----  I-stat             Lab results------EKG 05/17/21 Epic COVID test -----patient states no new symptoms, no test needed. Arrive at -------05:30 09/14/21. NPO after MN NO Solid Food.  Clear liquids from MN until---04:30am. Med rec completed. Medications to take morning of surgery -----Nebivolol, hydralazine, labetolol, amlodipine, and protonix only. Follow cardiologist instructions for holding plavix and aspirin. Diabetic medication -----Take 1/2 dose lantus night before. No metformin am of surgery. Patient instructed to bring photo id and insurance card day of surgery. Patient aware to have Driver (ride ) / caregiver for 24 hours after surgery.  Patient Special Instructions -----NA Pre-Op special Istructions -----NA Patient verbalized understanding of instructions that were given at this phone interview. Patient denies fever and cough at this phone interview.   Patient states he experiences chest pain and SOB in mornings and after eating. Last EKG 05/17/21 shows A-fib. Patient has significant cardiac history which has been reviewed by Dr. Kalman Shan, anesthesiologist. Pt has been cleared by cardiologist 08/30/21.  Okay to proceed with surgery as planned per Dr. Kalman Shan as well.

## 2021-09-14 ENCOUNTER — Ambulatory Visit (HOSPITAL_BASED_OUTPATIENT_CLINIC_OR_DEPARTMENT_OTHER): Admission: RE | Admit: 2021-09-14 | Payer: Medicare HMO | Source: Home / Self Care | Admitting: Surgery

## 2021-09-14 HISTORY — DX: Atrioventricular block, first degree: I44.0

## 2021-09-14 HISTORY — DX: Nonspecific intraventricular block: I45.4

## 2021-09-14 HISTORY — DX: Unspecified atrial fibrillation: I48.91

## 2021-09-14 SURGERY — EXCISION MASS
Anesthesia: General

## 2021-11-20 ENCOUNTER — Other Ambulatory Visit: Payer: Self-pay | Admitting: Urology

## 2021-11-20 DIAGNOSIS — E349 Endocrine disorder, unspecified: Secondary | ICD-10-CM

## 2021-11-20 NOTE — Telephone Encounter (Signed)
Pt needs refill sent to [facility] for a 90 day supply of Testosterone Gel. ?

## 2021-11-21 MED ORDER — TESTOSTERONE 1.62 % TD GEL: 2.0000 | Freq: Every day | TRANSDERMAL | 3 refills | Status: DC

## 2022-01-19 ENCOUNTER — Other Ambulatory Visit
Admission: RE | Admit: 2022-01-19 | Discharge: 2022-01-19 | Disposition: A | Discharge status: Home / Self Care | Payer: Medicare HMO | Attending: Urology | Admitting: Urology

## 2022-01-19 ENCOUNTER — Other Ambulatory Visit: Payer: Self-pay

## 2022-01-19 DIAGNOSIS — E349 Endocrine disorder, unspecified: Secondary | ICD-10-CM

## 2022-01-19 DIAGNOSIS — N401 Enlarged prostate with lower urinary tract symptoms: Secondary | ICD-10-CM | POA: Insufficient documentation

## 2022-01-19 DIAGNOSIS — E291 Testicular hypofunction: Secondary | ICD-10-CM | POA: Diagnosis not present

## 2022-01-19 DIAGNOSIS — Z8639 Personal history of other endocrine, nutritional and metabolic disease: Secondary | ICD-10-CM

## 2022-01-19 LAB — HEMOGLOBIN AND HEMATOCRIT, BLOOD
HCT: 35.8 % — ABNORMAL LOW (ref 39.0–52.0)
Hemoglobin: 11.3 g/dL — ABNORMAL LOW (ref 13.0–17.0)

## 2022-01-19 LAB — PSA: Prostatic Specific Antigen: 0.93 ng/mL (ref 0.00–4.00)

## 2022-01-20 LAB — TESTOSTERONE: Testosterone: 691 ng/dL (ref 264–916)

## 2022-01-22 ENCOUNTER — Ambulatory Visit: Payer: Medicare HMO | Admitting: Urology

## 2022-01-22 ENCOUNTER — Encounter: Payer: Self-pay | Admitting: Urology

## 2022-01-22 VITALS — BP 205/95 | HR 74 | Ht 73.0 in | Wt 254.8 lb

## 2022-01-22 DIAGNOSIS — N138 Other obstructive and reflux uropathy: Secondary | ICD-10-CM

## 2022-01-22 DIAGNOSIS — E349 Endocrine disorder, unspecified: Secondary | ICD-10-CM

## 2022-01-22 DIAGNOSIS — D649 Anemia, unspecified: Secondary | ICD-10-CM | POA: Diagnosis not present

## 2022-01-22 DIAGNOSIS — N401 Enlarged prostate with lower urinary tract symptoms: Secondary | ICD-10-CM

## 2022-01-22 DIAGNOSIS — E291 Testicular hypofunction: Secondary | ICD-10-CM | POA: Diagnosis not present

## 2022-01-22 NOTE — Progress Notes (Signed)
01/22/22 10:37 AM   [name] May 15, 1946 [id number]  Referring provider:  Albina Billet, MD [address]   [place],  [place] [address]  Chief Complaint  Patient presents with   Testosterone deficiency    Urological history  1. Testosterone deficiency -contributing factors of age, obesity, diabetes and CKD -testosterone 691 in 01/2022  -H & H stable, but anemic in 01/19/2022 -managed with testosterone gel 1.62%, 2 pumps daily   2. BPH with LU TS -PSA 0.93 in 01/2022 -I PSS 18/4   3. ED -contributing factors of age, BPH, testosterone deficiency, DM, HTN, HLD, sleep apnea and CAD  - SHIM deferred   4. Sleep apnea -untreated    5. Urethroplasty as a child    HPI: [name] is a 76 y.o.male who presents today for a 6 month follow-up with PSA, testosterone, an H&H labs with [facility].   He reports that he has urinary frequency that happens during the day and nighttime. He reports that he does not sleep with his CPAP machine.  It is becoming very bothersome to him as he had to leave his grandson's baseball games to find a restroom.    Patient denies any modifying or aggravating factors.  Patient denies any gross hematuria, dysuria or suprapubic/flank pain.  Patient denies any fevers, chills, nausea or vomiting.     IPSS     Row Name 01/22/22 1000         International Prostate Symptom Score   How often have you had the sensation of not emptying your bladder? Less than half the time     How often have you had to urinate less than every two hours? More than half the time     How often have you found you stopped and started again several times when you urinated? Less than half the time     How often have you found it difficult to postpone urination? About half the time     How often have you had a weak urinary stream? Less than half the time     How often have you had to strain to start urination? Less than 1 in 5 times     How many times did you typically get  up at night to urinate? 4 Times     Total IPSS Score 18       Quality of Life due to urinary symptoms   If you were to spend the rest of your life with your urinary condition just the way it is now how would you feel about that? 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    Chronic kidney disease    patient not aware of this dx   Coronary artery disease    Diabetes mellitus without complication ([place])    type 2   Dyspnea    GERD (gastroesophageal reflux disease)    History of blood transfusion    Hypertension    Sleep apnea    does not use cpap    Surgical History: Past Surgical History:  Procedure Laterality Date   APPENDECTOMY     CARDIAC CATHETERIZATION Left 08/16/2016   Procedure: Left Heart Cath and Coronary  Angiography;  Surgeon: Yolonda Kida, MD;  Location: [place] [facility];  Service: Cardiovascular;  Laterality: Left;   COLON SURGERY     part of colon removed   COLONOSCOPY N/A 09/07/2020   Procedure: COLONOSCOPY;  Surgeon: Virgel Manifold, MD;  Location: [facility] ENDOSCOPY;  Service: Endoscopy;  Laterality: N/A;   COLONOSCOPY WITH PROPOFOL N/A 05/17/2021   Procedure: COLONOSCOPY WITH PROPOFOL;  Surgeon: Virgel Manifold, MD;  Location: [facility] ENDOSCOPY;  Service: Endoscopy;  Laterality: N/A;   CORONARY ARTERY BYPASS GRAFT  08/30/2016   triple   CORONARY STENT INTERVENTION N/A 06/09/2020   Procedure: CORONARY STENT INTERVENTION;  Surgeon: Yolonda Kida, MD;  Location: [place] [facility];  Service: Cardiovascular;  Laterality: N/A;   ENDOSCOPIC MUCOSAL RESECTION N/A 09/22/2020   Procedure: ENDOSCOPIC MUCOSAL RESECTION;  Surgeon: Rush Landmark Telford Nab., MD;  Location: [place];  Service: Gastroenterology;  Laterality: N/A;   ESOPHAGOGASTRODUODENOSCOPY N/A  09/07/2020   Procedure: ESOPHAGOGASTRODUODENOSCOPY (EGD);  Surgeon: Virgel Manifold, MD;  Location: [facility] ENDOSCOPY;  Service: Endoscopy;  Laterality: N/A;   ESOPHAGOGASTRODUODENOSCOPY (EGD) WITH PROPOFOL N/A 09/22/2020   Procedure: ESOPHAGOGASTRODUODENOSCOPY (EGD) WITH PROPOFOL;  Surgeon: Rush Landmark Telford Nab., MD;  Location: [place];  Service: Gastroenterology;  Laterality: N/A;   FRACTURE SURGERY     HEMOSTASIS CLIP PLACEMENT  09/22/2020   Procedure: HEMOSTASIS CLIP PLACEMENT;  Surgeon: Irving Copas., MD;  Location: [place];  Service: Gastroenterology;;   HEMOSTASIS CONTROL  09/22/2020   Procedure: HEMOSTASIS CONTROL;  Surgeon: Irving Copas., MD;  Location: [place];  Service: Gastroenterology;;   LEFT HEART CATH AND CORONARY ANGIOGRAPHY Left 06/09/2020   Procedure: LEFT HEART CATH AND CORONARY ANGIOGRAPHY;  Surgeon: Yolonda Kida, MD;  Location: [place] [facility];  Service: Cardiovascular;  Laterality: Left;   LEFT HEART CATH AND CORS/GRAFTS ANGIOGRAPHY Left 11/20/2016   Procedure: Left Heart Cath and Cors/Grafts Angiography;  Surgeon: Yolonda Kida, MD;  Location: [place] [facility];  Service: Cardiovascular;  Laterality: Left;   POLYPECTOMY  09/22/2020   Procedure: POLYPECTOMY;  Surgeon: Mansouraty, Telford Nab., MD;  Location: [place];  Service: Gastroenterology;;   SUBMUCOSAL LIFTING INJECTION  09/22/2020   Procedure: SUBMUCOSAL LIFTING INJECTION;  Surgeon: Irving Copas., MD;  Location: [place];  Service: Gastroenterology;;    Home Medications:  Allergies as of 01/22/2022       Reactions   Isosorbide Cough        Medication List        Accurate as of January 22, 2022 10:37 AM. If you have any questions, ask your nurse or doctor.          STOP taking these medications    isosorbide mononitrate 30 MG 24 hr tablet Commonly known as: IMDUR   pantoprazole 20 MG tablet Commonly known as: Protonix    vitamin B-12 1000 MCG tablet Commonly known as: CYANOCOBALAMIN       TAKE these medications    acetaminophen 325 MG tablet Commonly known as: TYLENOL Take 2 tablets (650 mg total) by mouth every 6 (six) hours as needed for mild pain (or Fever >/= 101).   amLODipine 10 MG tablet Commonly known as: NORVASC Take 10 mg by mouth daily.   aspirin EC 81 MG tablet Take 81 mg by mouth daily. Swallow whole.   clopidogrel 75 MG tablet Commonly known as: PLAVIX Take 75 mg by mouth daily.   dexlansoprazole 60 MG capsule Commonly known as: DEXILANT Take 1 capsule by mouth daily.  hydrALAZINE 50 MG tablet Commonly known as: APRESOLINE Take 1 tablet by mouth 3 (three) times daily.   hydrochlorothiazide 12.5 MG tablet Commonly known as: HYDRODIURIL Take 12.5 mg by mouth daily.   insulin glargine 100 UNIT/ML injection Commonly known as: LANTUS Inject 25 Units into the skin at bedtime.   irbesartan 300 MG tablet Commonly known as: AVAPRO Take 1 tablet (300 mg total) by mouth daily.   labetalol 100 MG tablet Commonly known as: NORMODYNE Take 1 tablet by mouth 2 (two) times daily.   metFORMIN 500 MG 24 hr tablet Commonly known as: GLUCOPHAGE-XR Take 500 mg by mouth 2 (two) times daily.   nebivolol 10 MG tablet Commonly known as: BYSTOLIC Take 10 mg by mouth daily.   rosuvastatin 40 MG tablet Commonly known as: CRESTOR Take 40 mg by mouth daily.   sitaGLIPtin 100 MG tablet Commonly known as: JANUVIA Take 100 mg by mouth daily.   Testosterone 1.62 % Gel Apply 2 Pump topically daily. One pump on each arm        Allergies:  Allergies  Allergen Reactions   Isosorbide Cough    Family History: No family history on file.  Social History:  reports that he has never smoked. He has never used smokeless tobacco. He reports that he does not drink alcohol and does not use drugs.   Physical Exam: BP (!) 205/95   Pulse 74   Ht '6\' 1"'$  (1.854 m)   Wt 254 lb 12.8 oz  (115.6 kg)   BMI 33.62 kg/m   Constitutional:  Alert and oriented, No acute distress. HEENT: [place] AT, moist mucus membranes.  Trachea midline, no masses. Cardiovascular: No clubbing, cyanosis, or bilateral pedal edema. Respiratory: Normal respiratory effort, no increased work of breathing. GU: No CVA tenderness.  No bladder fullness or masses.  Patient with uncircumcised phallus. Foreskin easily retracted  Glandular hypospadius is noted.   No penile discharge. No penile lesions or rashes. Scrotum without lesions, cysts, rashes and/or edema.  Bilateral hydroceles, testicles are located scrotally bilaterally. No masses are appreciated in the testicles. Left and right epididymis are normal. Rectal: Patient with  normal sphincter tone. Anus and perineum without scarring or rashes. No rectal masses are appreciated. Prostate is approximately 50 grams, could only palpate the apex, no nodules are appreciated. Seminal vesicles could not be palpated Skin: No rashes, bruises or suspicious lesions. Neurologic: Grossly intact, no focal deficits, moving all 4 extremities. Psychiatric: Normal mood and affect.   Laboratory Data: Component     Latest Ref Rng 01/19/2022  Hemoglobin     13.0 - 17.0 g/dL 11.3 (L)   HCT     39.0 - 52.0 % 35.8 (L)      Component     Latest Ref Rng 01/19/2022  Testosterone     264 - 916 ng/dL 691     Component     Latest Ref Rng 01/19/2022  Prostatic Specific Antigen     0.00 - 4.00 ng/mL 0.93   I have reviewed the labs.   Pertinent Imaging N/A    Assessment & Plan:    1. Testosterone deficiency  -testosterone level is therapeutic -Continue AndroGel 1.62%, 2 pumps daily making sure it is applied daily  2. BPH with LUTS  - PSA is stable  - symptoms - frequency  -failed OAB medications -continue conservative management, avoiding bladder irritants and timed voiding's -Discussed undergoing a cystoscopy to further evaluate bladder and prostate anatomy and rule out any  underlying pathology  or malignancy - he is very hesitant as he has had numerous procedures himself and now his wife had an unfortunate complication with one of her procedures and is recovering -he also wonders if he would have a difficult time with recovering from a procedure due to his age and co-morbidities  -he would like more time to think about this and will call if he wishes to schedule   3. Anemia  - followed by hematology  Return in about 6 months (around 07/24/2022) for PSA, testosterone, H & H, I PSS and exam .  [facility] [address], [place], [place] [address] [phone]  I, Kirke Shaggy Littlejohn,acting as a scribe for [facility], PA-C.,have documented all relevant documentation on the behalf of Anyelo Mccue, PA-C,as directed by  [facility], PA-C while in the presence of [place], PA-C.  I have reviewed the above documentation for accuracy and completeness, and I agree with the above.    Zara Council, PA-C

## 2022-02-14 ENCOUNTER — Encounter: Payer: Self-pay | Admitting: Oncology

## 2022-02-14 ENCOUNTER — Inpatient Hospital Stay: Payer: Medicare HMO | Attending: Oncology

## 2022-02-14 ENCOUNTER — Other Ambulatory Visit: Payer: Self-pay

## 2022-02-14 ENCOUNTER — Inpatient Hospital Stay: Payer: Medicare HMO | Admitting: Oncology

## 2022-02-14 VITALS — BP 178/83 | HR 58 | Temp 97.7°F | Resp 18 | Wt 256.0 lb

## 2022-02-14 DIAGNOSIS — Z79899 Other long term (current) drug therapy: Secondary | ICD-10-CM | POA: Insufficient documentation

## 2022-02-14 DIAGNOSIS — D649 Anemia, unspecified: Secondary | ICD-10-CM

## 2022-02-14 DIAGNOSIS — D508 Other iron deficiency anemias: Secondary | ICD-10-CM

## 2022-02-14 DIAGNOSIS — D509 Iron deficiency anemia, unspecified: Secondary | ICD-10-CM

## 2022-02-14 DIAGNOSIS — Z86010 Personal history of colonic polyps: Secondary | ICD-10-CM | POA: Diagnosis not present

## 2022-02-14 LAB — CBC
HCT: 36 % — ABNORMAL LOW (ref 39.0–52.0)
Hemoglobin: 11.4 g/dL — ABNORMAL LOW (ref 13.0–17.0)
MCH: 29.2 pg (ref 26.0–34.0)
MCHC: 31.7 g/dL (ref 30.0–36.0)
MCV: 92.3 fL (ref 80.0–100.0)
Platelets: 124 10*3/uL — ABNORMAL LOW (ref 150–400)
RBC: 3.9 MIL/uL — ABNORMAL LOW (ref 4.22–5.81)
RDW: 17.6 % — ABNORMAL HIGH (ref 11.5–15.5)
WBC: 5.6 10*3/uL (ref 4.0–10.5)
nRBC: 0 % (ref 0.0–0.2)

## 2022-02-14 LAB — FOLATE: Folate: 13.3 ng/mL (ref >5.9)

## 2022-02-14 LAB — IRON AND TIBC
Iron: 43 ug/dL — ABNORMAL LOW (ref 45–182)
Saturation Ratios: 13 % — ABNORMAL LOW (ref 17.9–39.5)
TIBC: 323 ug/dL (ref 250–450)
UIBC: 280 ug/dL

## 2022-02-14 LAB — VITAMIN B12: Vitamin B-12: 326 pg/mL (ref 180–914)

## 2022-02-14 LAB — FERRITIN: Ferritin: 86 ng/mL (ref 24–336)

## 2022-02-14 NOTE — Progress Notes (Signed)
Hematology/Oncology Consult note [facility]  [phone] [phone]  Patient Care Team: Albina Billet, MD as PCP - General (Internal Medicine) Michael Boston, MD as Consulting Physician (General Surgery) Virgel Manifold, MD (Inactive) as Consulting Physician (Gastroenterology) Yolonda Kida, MD as Consulting Physician (Cardiology)   Name of the patient: Manan Olmo  [id number]  01-30-1946   Date of visit: 02/14/22  Diagnosis-normocytic anemia likely secondary to chronic disease as well as component of iron deficiency  Chief complaint/ Reason for visit-routine follow-up of anemia  Heme/Onc history: Patient is a 76 year old male with history of multiple gastric polyps who was seen by both Dr. Rush Landmark and Dr. Bonna Gains.  He has undergone EGD with Dr. Rush Landmark and had a large gastric hyperplastic polyp removal.  Prior to that patient had significant iron deficiency and his aspirin and Plavix were on hold.  These were restarted after EGD.  Most recent CBC on 11/01/2020 showed white count of 6.8, H&H of 11.3/36.4 with an MCV of 85 and a platelet count of 158.  Iron studies were normal.  Patient denies any bleeding in his stool or dark melanotic stools.   Patient received 5 doses of Venofer in October 2022 and his hemoglobin improved from 7-12  Interval history-patient received IV iron last in October 2022.He reports some improvement in his energy levels after receiving IV iron but presently reports feeling fatigued again.  He is not presently taking any oral iron.  ECOG PS- 1 Pain scale- 0   Review of systems- Review of Systems  Constitutional:  Positive for malaise/fatigue. Negative for chills, fever and weight loss.  HENT:  Negative for congestion, ear discharge and nosebleeds.   Eyes:  Negative for blurred vision.  Respiratory:  Negative for cough, hemoptysis, sputum production, shortness of breath and wheezing.    Cardiovascular:  Negative for chest pain, palpitations, orthopnea and claudication.  Gastrointestinal:  Negative for abdominal pain, blood in stool, constipation, diarrhea, heartburn, melena, nausea and vomiting.  Genitourinary:  Negative for dysuria, flank pain, frequency, hematuria and urgency.  Musculoskeletal:  Negative for back pain, joint pain and myalgias.  Skin:  Negative for rash.  Neurological:  Negative for dizziness, tingling, focal weakness, seizures, weakness and headaches.  Endo/Heme/Allergies:  Does not bruise/bleed easily.  Psychiatric/Behavioral:  Negative for depression and suicidal ideas. The patient does not have insomnia.       Allergies  Allergen Reactions   Isosorbide Cough     Past Medical History:  Diagnosis Date   Anemia    Arthritis    Atrial fibrillation (HCC)    AV block, 1st degree    Bundle branch block    Chronic kidney disease    patient not aware of this dx   Coronary artery disease    Diabetes mellitus without complication (HCC)    type 2   Dyspnea    GERD (gastroesophageal reflux disease)    History of blood transfusion    Hypertension    Sleep apnea    does not use cpap     Past Surgical History:  Procedure Laterality Date   APPENDECTOMY     CARDIAC CATHETERIZATION Left 08/16/2016   Procedure: Left Heart Cath and Coronary Angiography;  Surgeon: Yolonda Kida, MD;  Location: [place] [facility];  Service: Cardiovascular;  Laterality: Left;   COLON SURGERY     part of colon removed   COLONOSCOPY N/A 09/07/2020   Procedure: COLONOSCOPY;  Surgeon: Virgel Manifold, MD;  Location: [facility];  Service: Endoscopy;  Laterality: N/A;   COLONOSCOPY WITH PROPOFOL N/A 05/17/2021   Procedure: COLONOSCOPY WITH PROPOFOL;  Surgeon: Virgel Manifold, MD;  Location: [facility];  Service: Endoscopy;  Laterality: N/A;   CORONARY ARTERY BYPASS GRAFT  08/30/2016   triple   CORONARY STENT INTERVENTION N/A 06/09/2020    Procedure: CORONARY STENT INTERVENTION;  Surgeon: Yolonda Kida, MD;  Location: [place] [facility];  Service: Cardiovascular;  Laterality: N/A;   ENDOSCOPIC MUCOSAL RESECTION N/A 09/22/2020   Procedure: ENDOSCOPIC MUCOSAL RESECTION;  Surgeon: Rush Landmark Telford Nab., MD;  Location: [place];  Service: Gastroenterology;  Laterality: N/A;   ESOPHAGOGASTRODUODENOSCOPY N/A 09/07/2020   Procedure: ESOPHAGOGASTRODUODENOSCOPY (EGD);  Surgeon: Virgel Manifold, MD;  Location: [facility];  Service: Endoscopy;  Laterality: N/A;   ESOPHAGOGASTRODUODENOSCOPY (EGD) WITH PROPOFOL N/A 09/22/2020   Procedure: ESOPHAGOGASTRODUODENOSCOPY (EGD) WITH PROPOFOL;  Surgeon: Rush Landmark Telford Nab., MD;  Location: [place];  Service: Gastroenterology;  Laterality: N/A;   FRACTURE SURGERY     HEMOSTASIS CLIP PLACEMENT  09/22/2020   Procedure: HEMOSTASIS CLIP PLACEMENT;  Surgeon: Irving Copas., MD;  Location: [place];  Service: Gastroenterology;;   HEMOSTASIS CONTROL  09/22/2020   Procedure: HEMOSTASIS CONTROL;  Surgeon: Irving Copas., MD;  Location: [place];  Service: Gastroenterology;;   LEFT HEART CATH AND CORONARY ANGIOGRAPHY Left 06/09/2020   Procedure: LEFT HEART CATH AND CORONARY ANGIOGRAPHY;  Surgeon: Yolonda Kida, MD;  Location: [place] [facility];  Service: Cardiovascular;  Laterality: Left;   LEFT HEART CATH AND CORS/GRAFTS ANGIOGRAPHY Left 11/20/2016   Procedure: Left Heart Cath and Cors/Grafts Angiography;  Surgeon: Yolonda Kida, MD;  Location: [place] [facility];  Service: Cardiovascular;  Laterality: Left;   POLYPECTOMY  09/22/2020   Procedure: POLYPECTOMY;  Surgeon: Mansouraty, Telford Nab., MD;  Location: [place];  Service: Gastroenterology;;   SUBMUCOSAL LIFTING INJECTION  09/22/2020   Procedure: SUBMUCOSAL LIFTING INJECTION;  Surgeon: Irving Copas., MD;  Location: [facility];  Service: Gastroenterology;;    Social  History   Socioeconomic History   Marital status: Married    Spouse name: Not on file   Number of children: Not on file   Years of education: Not on file   Highest education level: Not on file  Occupational History   Not on file  Tobacco Use   Smoking status: Never   Smokeless tobacco: Never  Vaping Use   Vaping Use: Never used  Substance and Sexual Activity   Alcohol use: No   Drug use: No   Sexual activity: Yes  Other Topics Concern   Not on file  Social History Narrative   Not on file   Social Determinants of Health   Financial Resource Strain: Not on file  Food Insecurity: Not on file  Transportation Needs: Not on file  Physical Activity: Not on file  Stress: Not on file  Social Connections: Not on file  Intimate Partner Violence: Not on file    History reviewed. No pertinent family history.   Current Outpatient Medications:    amLODipine (NORVASC) 10 MG tablet, Take 10 mg by mouth daily., Disp: , Rfl:    aspirin EC 81 MG tablet, Take 81 mg by mouth daily. Swallow whole., Disp: , Rfl:    clopidogrel (PLAVIX) 75 MG tablet, Take 75 mg by mouth daily., Disp: , Rfl:    dexlansoprazole (DEXILANT) 60 MG capsule, Take 1 capsule by mouth daily., Disp: , Rfl:    hydrALAZINE (APRESOLINE) 50  MG tablet, Take 1 tablet by mouth 3 (three) times daily., Disp: , Rfl:    hydrochlorothiazide (HYDRODIURIL) 12.5 MG tablet, Take 12.5 mg by mouth daily., Disp: , Rfl:    insulin glargine (LANTUS) 100 UNIT/ML injection, Inject 25 Units into the skin at bedtime. , Disp: , Rfl:    irbesartan (AVAPRO) 300 MG tablet, Take 1 tablet (300 mg total) by mouth daily., Disp: 30 tablet, Rfl: 1   labetalol (NORMODYNE) 100 MG tablet, Take 1 tablet by mouth 2 (two) times daily., Disp: , Rfl:    metFORMIN (GLUCOPHAGE-XR) 500 MG 24 hr tablet, Take 500 mg by mouth 2 (two) times daily., Disp: , Rfl:    nebivolol (BYSTOLIC) 10 MG tablet, Take 10 mg by mouth daily., Disp: , Rfl:    rosuvastatin (CRESTOR) 40  MG tablet, Take 40 mg by mouth daily., Disp: , Rfl:    sitaGLIPtin (JANUVIA) 100 MG tablet, Take 100 mg by mouth daily., Disp: , Rfl:    Testosterone 1.62 % GEL, Apply 2 Pump topically daily. One pump on each arm, Disp: 225 g, Rfl: 3   acetaminophen (TYLENOL) 325 MG tablet, Take 2 tablets (650 mg total) by mouth every 6 (six) hours as needed for mild pain (or Fever >/= 101). (Patient not taking: Reported on 02/14/2022), Disp: , Rfl:   Physical exam:  Vitals:   02/14/22 1110  BP: (!) 178/83  Pulse: (!) 58  Resp: 18  Temp: 97.7 F (36.5 C)  SpO2: 97%  Weight: 256 lb (116.1 kg)   Physical Exam Constitutional:      General: He is not in acute distress. Cardiovascular:     Rate and Rhythm: Normal rate and regular rhythm.     Heart sounds: Normal heart sounds.  Pulmonary:     Effort: Pulmonary effort is normal.     Breath sounds: Normal breath sounds.  Skin:    General: Skin is warm and dry.  Neurological:     Mental Status: He is alert and oriented to person, place, and time.         Latest Ref Rng & Units 09/08/2020    6:50 AM  CMP  Glucose 70 - 99 mg/dL 214   BUN 8 - 23 mg/dL 12   Creatinine 0.61 - 1.24 mg/dL 1.19   Sodium 135 - 145 mmol/L 139   Potassium 3.5 - 5.1 mmol/L 3.5   Chloride 98 - 111 mmol/L 104   CO2 22 - 32 mmol/L 26   Calcium 8.9 - 10.3 mg/dL 9.8   Total Protein 6.5 - 8.1 g/dL 6.4   Total Bilirubin 0.3 - 1.2 mg/dL 0.4   Alkaline Phos 38 - 126 U/L 88   AST 15 - 41 U/L 24   ALT 0 - 44 U/L 20       Latest Ref Rng & Units 02/14/2022   10:48 AM  CBC  WBC 4.0 - 10.5 K/uL 5.6   Hemoglobin 13.0 - 17.0 g/dL 11.4   Hematocrit 39.0 - 52.0 % 36.0   Platelets 150 - 400 K/uL 124     No images are attached to the encounter.  No results found.   Assessment and plan- Patient is a 76 y.o. male with history of iron deficiency anemia here for routine follow-up  Patient's hemoglobin has been stable around 11 for the last 1 year.  Prior to that it was closer to  12.He last received IV iron in October 2022.  Presently his B12 levels are pending.  Ferritin levels are however normal at 86.  Iron saturation is low at 13%.  Given iron saturation is less than 20% I will consider giving him IV iron although I am not sure how much it will improve his hemoglobin.  B12 levels are also pending  Repeat CBC ferritin and iron studies in 3 in 6 months.  See me in 6 months   Visit Diagnosis 1. Normocytic anemia   2. Iron deficiency anemia, unspecified iron deficiency anemia type      Dr. Randa Evens, MD, MPH [facility] at [facility] [phone] 02/14/2022 12:09 PM

## 2022-02-19 ENCOUNTER — Telehealth: Payer: Self-pay

## 2022-02-19 NOTE — Telephone Encounter (Signed)
Pt approved for Testosterone gel. Case [id number]; Status:Approved; Review Type:Prior Auth; Coverage Start Date:01/20/2022; Coverage End Date:02/19/2023.

## 2022-02-23 ENCOUNTER — Other Ambulatory Visit: Payer: Self-pay | Admitting: Oncology

## 2022-02-26 ENCOUNTER — Inpatient Hospital Stay: Payer: Medicare HMO

## 2022-02-26 VITALS — BP 178/67 | HR 65 | Temp 97.3°F | Resp 18

## 2022-02-26 DIAGNOSIS — D508 Other iron deficiency anemias: Secondary | ICD-10-CM

## 2022-02-26 DIAGNOSIS — D509 Iron deficiency anemia, unspecified: Secondary | ICD-10-CM | POA: Diagnosis not present

## 2022-02-26 MED ORDER — SODIUM CHLORIDE 0.9 % IV SOLN
200.0000 mg | INTRAVENOUS | Status: DC
  Administered 2022-02-26: 200 mg via INTRAVENOUS
  Filled 2022-02-26: qty 200

## 2022-02-26 MED ORDER — SODIUM CHLORIDE 0.9 % IV SOLN
Freq: Once | INTRAVENOUS | Status: AC
  Filled 2022-02-26: qty 250

## 2022-02-26 MED ORDER — SODIUM CHLORIDE 0.9% FLUSH
10.0000 mL | Freq: Once | INTRAVENOUS | Status: AC | PRN
  Administered 2022-02-26: 10 mL
  Filled 2022-02-26: qty 10

## 2022-02-26 NOTE — Patient Instructions (Signed)

## 2022-02-26 NOTE — Progress Notes (Signed)
Patient tolerated Venofer infusion well. Patient states BP always elevated. Asymptomatic, no questions/concerns voiced. Patient stable at discharge. AVS given.

## 2022-02-27 MED FILL — Iron Sucrose Inj 20 MG/ML (Fe Equiv): INTRAVENOUS | Qty: 10 | Status: AC

## 2022-02-28 ENCOUNTER — Inpatient Hospital Stay: Payer: Medicare HMO

## 2022-02-28 VITALS — BP 157/67 | HR 53 | Temp 96.0°F

## 2022-02-28 DIAGNOSIS — D509 Iron deficiency anemia, unspecified: Secondary | ICD-10-CM | POA: Diagnosis not present

## 2022-02-28 DIAGNOSIS — D508 Other iron deficiency anemias: Secondary | ICD-10-CM

## 2022-02-28 MED ORDER — SODIUM CHLORIDE 0.9 % IV SOLN
Freq: Once | INTRAVENOUS | Status: AC
  Filled 2022-02-28: qty 250

## 2022-02-28 MED ORDER — SODIUM CHLORIDE 0.9 % IV SOLN
200.0000 mg | INTRAVENOUS | Status: DC
  Administered 2022-02-28: 200 mg via INTRAVENOUS
  Filled 2022-02-28: qty 200

## 2022-02-28 NOTE — Patient Instructions (Signed)

## 2022-03-01 MED FILL — Iron Sucrose Inj 20 MG/ML (Fe Equiv): INTRAVENOUS | Qty: 10 | Status: AC

## 2022-03-02 ENCOUNTER — Inpatient Hospital Stay: Payer: Medicare HMO

## 2022-03-02 VITALS — BP 153/75 | HR 50 | Temp 96.0°F | Resp 18

## 2022-03-02 DIAGNOSIS — D509 Iron deficiency anemia, unspecified: Secondary | ICD-10-CM | POA: Diagnosis not present

## 2022-03-02 DIAGNOSIS — D508 Other iron deficiency anemias: Secondary | ICD-10-CM

## 2022-03-02 MED ORDER — SODIUM CHLORIDE 0.9 % IV SOLN
200.0000 mg | INTRAVENOUS | Status: DC
  Administered 2022-03-02: 200 mg via INTRAVENOUS
  Filled 2022-03-02: qty 200

## 2022-03-02 MED ORDER — SODIUM CHLORIDE 0.9 % IV SOLN
Freq: Once | INTRAVENOUS | Status: AC
  Filled 2022-03-02: qty 250

## 2022-03-05 ENCOUNTER — Inpatient Hospital Stay: Payer: Medicare HMO

## 2022-03-05 VITALS — BP 163/82 | HR 56 | Temp 96.0°F | Resp 19

## 2022-03-05 DIAGNOSIS — D508 Other iron deficiency anemias: Secondary | ICD-10-CM

## 2022-03-05 DIAGNOSIS — D509 Iron deficiency anemia, unspecified: Secondary | ICD-10-CM | POA: Diagnosis not present

## 2022-03-05 MED ORDER — SODIUM CHLORIDE 0.9 % IV SOLN
Freq: Once | INTRAVENOUS | Status: AC
  Filled 2022-03-05: qty 250

## 2022-03-05 MED ORDER — SODIUM CHLORIDE 0.9 % IV SOLN
200.0000 mg | INTRAVENOUS | Status: DC
  Administered 2022-03-05: 200 mg via INTRAVENOUS
  Filled 2022-03-05: qty 200

## 2022-03-05 NOTE — Patient Instructions (Signed)

## 2022-03-08 ENCOUNTER — Inpatient Hospital Stay: Payer: Medicare HMO

## 2022-03-08 VITALS — BP 156/63 | HR 66 | Temp 96.0°F | Resp 18

## 2022-03-08 DIAGNOSIS — D508 Other iron deficiency anemias: Secondary | ICD-10-CM

## 2022-03-08 DIAGNOSIS — D509 Iron deficiency anemia, unspecified: Secondary | ICD-10-CM | POA: Diagnosis not present

## 2022-03-08 MED ORDER — SODIUM CHLORIDE 0.9 % IV SOLN
200.0000 mg | INTRAVENOUS | Status: DC
  Administered 2022-03-08: 200 mg via INTRAVENOUS
  Filled 2022-03-08: qty 200

## 2022-03-08 MED ORDER — SODIUM CHLORIDE 0.9 % IV SOLN
Freq: Once | INTRAVENOUS | Status: AC
  Filled 2022-03-08: qty 250

## 2022-05-17 ENCOUNTER — Inpatient Hospital Stay: Payer: Medicare HMO | Attending: Oncology

## 2022-05-17 DIAGNOSIS — D649 Anemia, unspecified: Secondary | ICD-10-CM

## 2022-05-17 DIAGNOSIS — D509 Iron deficiency anemia, unspecified: Secondary | ICD-10-CM | POA: Insufficient documentation

## 2022-05-17 LAB — CBC WITH DIFFERENTIAL/PLATELET
Abs Immature Granulocytes: 0.01 10*3/uL (ref 0.00–0.07)
Basophils Absolute: 0 10*3/uL (ref 0.0–0.1)
Basophils Relative: 1 %
Eosinophils Absolute: 0.1 10*3/uL (ref 0.0–0.5)
Eosinophils Relative: 2 %
HCT: 37.6 % — ABNORMAL LOW (ref 39.0–52.0)
Hemoglobin: 12 g/dL — ABNORMAL LOW (ref 13.0–17.0)
Immature Granulocytes: 0 %
Lymphocytes Relative: 12 %
Lymphs Abs: 0.7 10*3/uL (ref 0.7–4.0)
MCH: 30 pg (ref 26.0–34.0)
MCHC: 31.9 g/dL (ref 30.0–36.0)
MCV: 94 fL (ref 80.0–100.0)
Monocytes Absolute: 0.5 10*3/uL (ref 0.1–1.0)
Monocytes Relative: 9 %
Neutro Abs: 4.4 10*3/uL (ref 1.7–7.7)
Neutrophils Relative %: 76 %
Platelets: 93 10*3/uL — ABNORMAL LOW (ref 150–400)
RBC: 4 MIL/uL — ABNORMAL LOW (ref 4.22–5.81)
RDW: 16.5 % — ABNORMAL HIGH (ref 11.5–15.5)
WBC: 5.7 10*3/uL (ref 4.0–10.5)
nRBC: 0 % (ref 0.0–0.2)

## 2022-05-17 LAB — IRON AND TIBC
Iron: 48 ug/dL (ref 45–182)
Saturation Ratios: 15 % — ABNORMAL LOW (ref 17.9–39.5)
TIBC: 312 ug/dL (ref 250–450)
UIBC: 264 ug/dL

## 2022-05-17 LAB — FERRITIN: Ferritin: 137 ng/mL (ref 24–336)

## 2022-06-07 ENCOUNTER — Other Ambulatory Visit: Payer: Self-pay

## 2022-06-07 DIAGNOSIS — E349 Endocrine disorder, unspecified: Secondary | ICD-10-CM

## 2022-06-07 NOTE — Telephone Encounter (Signed)
Incoming fax from [place] regarding a medication refill request for testosterone gel.

## 2022-06-08 MED ORDER — TESTOSTERONE 1.62 % TD GEL: 2.0000 | Freq: Every day | TRANSDERMAL | 3 refills | Status: DC

## 2022-06-29 ENCOUNTER — Other Ambulatory Visit: Payer: Self-pay | Admitting: Urology

## 2022-06-29 IMAGING — CR DG CHEST 2V
2 series · 2 of 2 positions shown · non-contrast
Comparison: 02/11/2017

CLINICAL DATA: Cough and shortness of breath

EXAM:
CHEST - 2 VIEW

[chest pa]
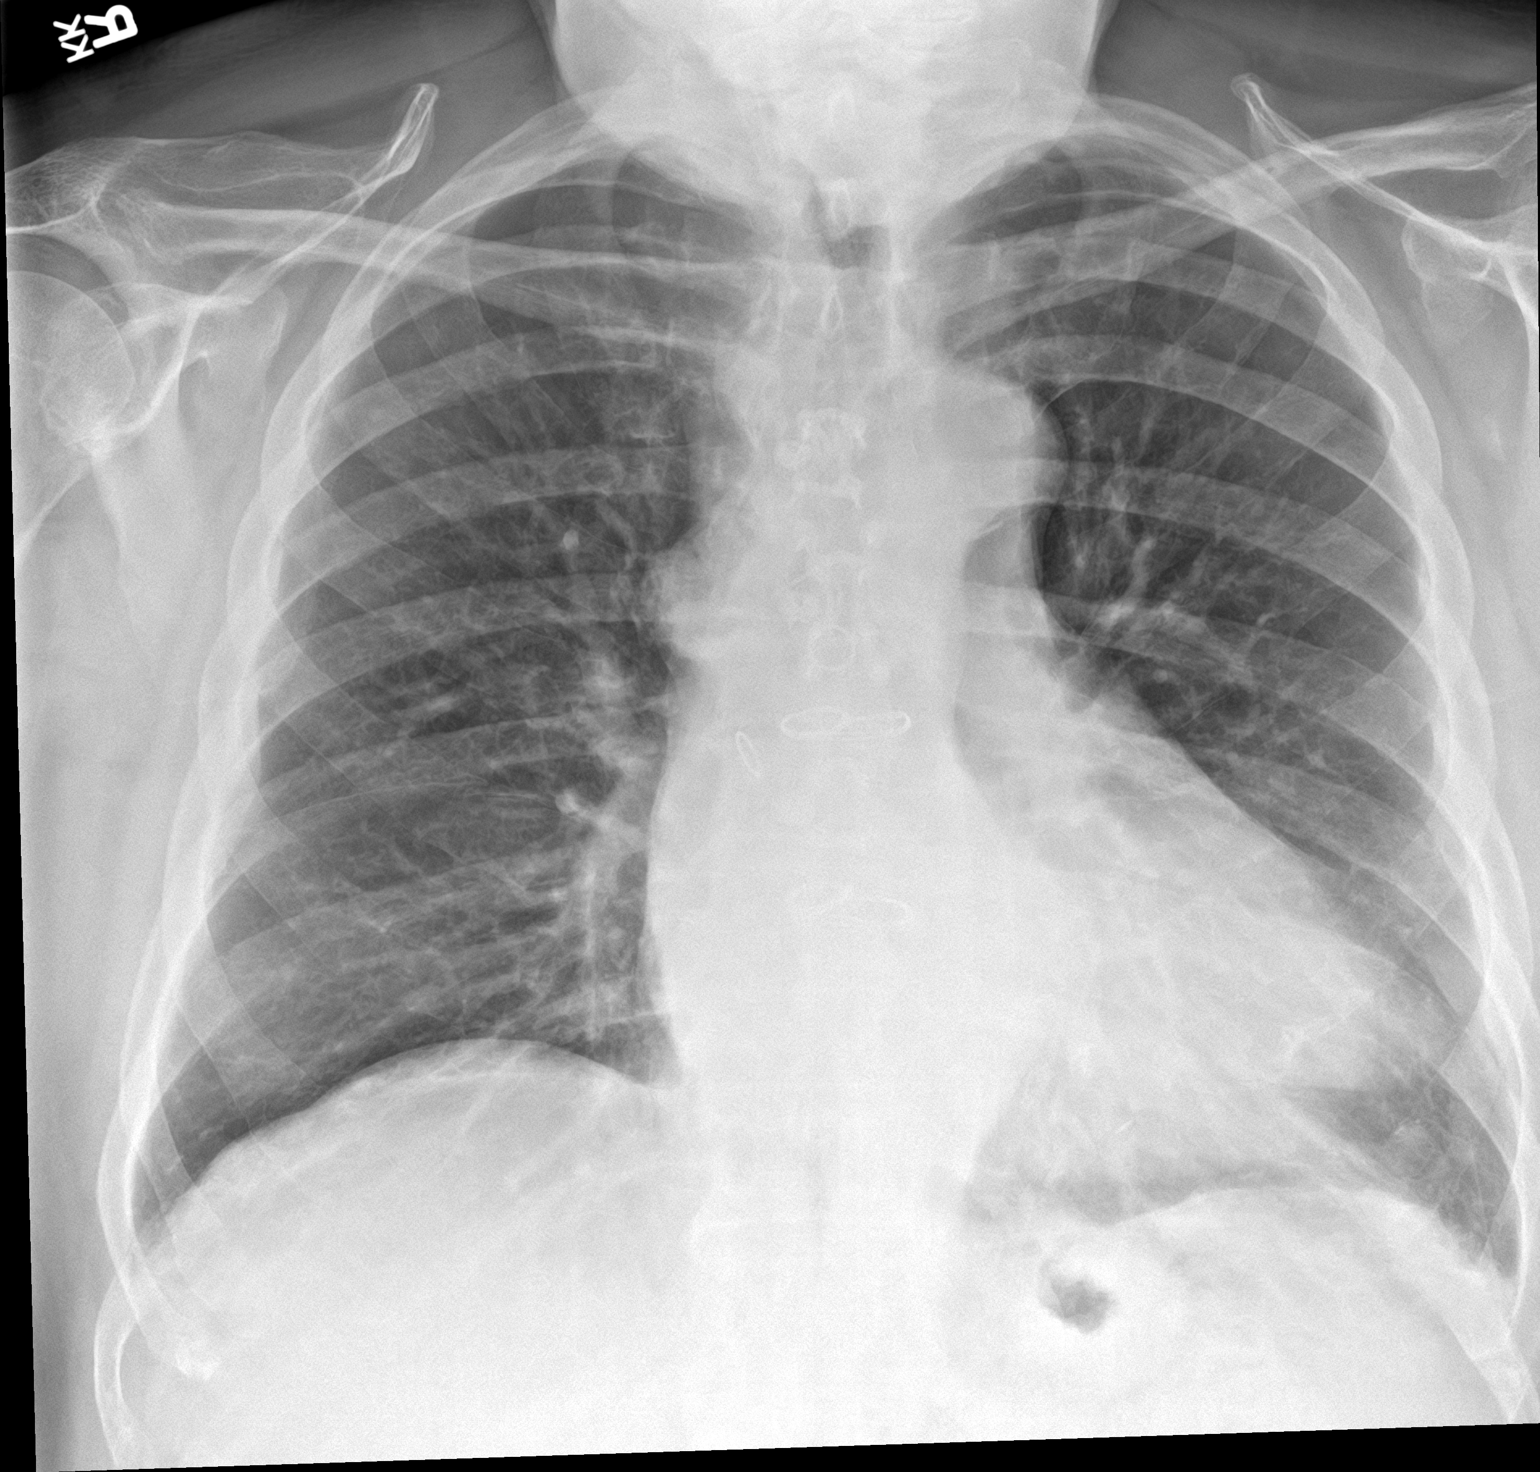

[chest lat]
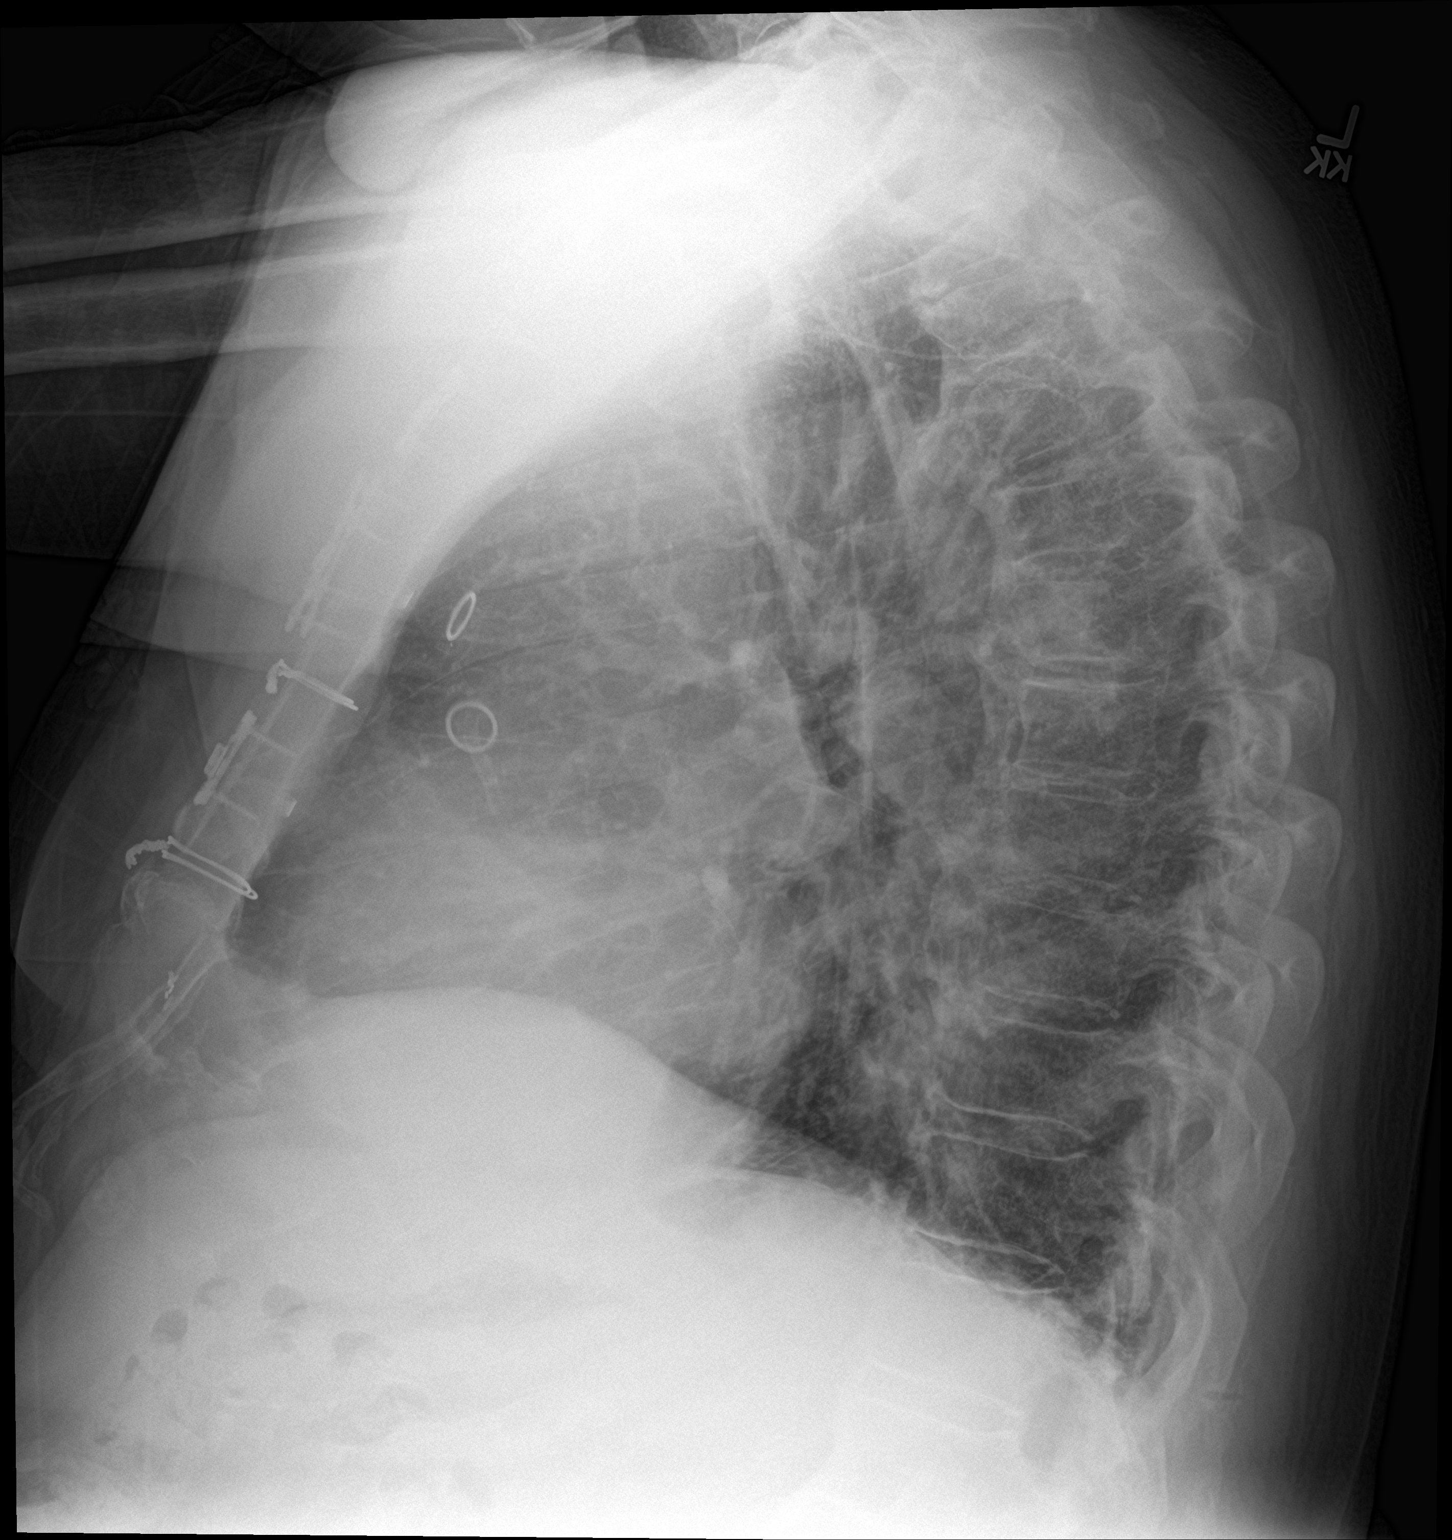

[2 of 2 positions shown; findings below may reference images not displayed]

FINDINGS: Previous median sternotomy and CABG. Mild cardiomegaly. Aortic
atherosclerosis and tortuosity. No evidence of heart failure or
effusion. Mild chronic scarring at the lung bases.
IMPRESSION: No active disease. Previous CABG. Mild cardiomegaly. Aortic
atherosclerosis.

## 2022-07-16 ENCOUNTER — Other Ambulatory Visit: Payer: Self-pay | Admitting: *Deleted

## 2022-07-16 DIAGNOSIS — N401 Enlarged prostate with lower urinary tract symptoms: Secondary | ICD-10-CM

## 2022-07-16 DIAGNOSIS — E349 Endocrine disorder, unspecified: Secondary | ICD-10-CM

## 2022-07-19 ENCOUNTER — Other Ambulatory Visit
Admission: RE | Admit: 2022-07-19 | Discharge: 2022-07-19 | Disposition: A | Discharge status: Home / Self Care | Payer: Medicare HMO | Attending: Urology | Admitting: Urology

## 2022-07-19 DIAGNOSIS — N138 Other obstructive and reflux uropathy: Secondary | ICD-10-CM

## 2022-07-19 DIAGNOSIS — N401 Enlarged prostate with lower urinary tract symptoms: Secondary | ICD-10-CM | POA: Insufficient documentation

## 2022-07-19 DIAGNOSIS — E349 Endocrine disorder, unspecified: Secondary | ICD-10-CM | POA: Diagnosis present

## 2022-07-19 LAB — PSA: Prostatic Specific Antigen: 0.91 ng/mL (ref 0.00–4.00)

## 2022-07-20 NOTE — Progress Notes (Signed)
07/23/22 10:25 AM   [name] 04-15-1946 [id number]  Referring provider:  Jaclyn Shaggy, MD [address]   [place],  [place] [address]  Urological history  1. Testosterone deficiency -contributing factors of age, obesity, diabetes and CKD -testosterone (07/2022) 1183 -H & H stable (07/2022) 11.2/34.8 -testosterone gel 1.62%, 2 pumps daily   2. BPH with LU TS -PSA (07/2022) 0.91 -I PSS 18/4   3. ED -contributing factors of age, BPH, testosterone deficiency, DM, HTN, HLD, sleep apnea and CAD  - SHIM deferred   4. Sleep apnea -untreated    5. Urethroplasty as a child   Chief Complaint  Patient presents with   Benign Prostatic Hypertrophy      HPI: [name] is a 76 y.o.male who presents today for a 6 month follow-up with PSA, testosterone, an H&H labs with PVR.   Testosterone levels super therapeutic.  He continues to have urinary frequency.  Patient denies any modifying or aggravating factors.  Patient denies any gross hematuria, dysuria or suprapubic/flank pain.  Patient denies any fevers, chills, nausea or vomiting.    UA yellow hazy, SG 1.020, pH 6.5, >300 protein, 6-10 WBC's and few bacteria.     IPSS     Row Name 07/23/22 1000         International Prostate Symptom Score   How often have you had the sensation of not emptying your bladder? Less than 1 in 5     How often have you had to urinate less than every two hours? More than half the time     How often have you found you stopped and started again several times when you urinated? Less than half the time     How often have you found it difficult to postpone urination? Less than half the time     How often have you had a weak urinary stream? About half the time     How often have you had to strain to start urination? Less than half the time     How many times did you typically get up at night to urinate? 4 Times     Total IPSS Score 18       Quality of Life due to urinary symptoms   If  you were to spend the rest of your life with your urinary condition just the way it is now how would you feel about that? 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    Chronic kidney disease    patient not aware of this dx   Coronary artery disease    Diabetes mellitus without complication (HCC)    type 2   Dyspnea    GERD (gastroesophageal reflux disease)    History of blood transfusion    Hypertension    Sleep apnea    does not use cpap    Surgical History: Past Surgical History:  Procedure Laterality Date   APPENDECTOMY     CARDIAC CATHETERIZATION Left 08/16/2016   Procedure: Left Heart Cath and Coronary Angiography;  Surgeon: Alwyn Pea, MD;  Location: [facility];  Service: Cardiovascular;  Laterality: Left;   COLON SURGERY  part of colon removed   COLONOSCOPY N/A 09/07/2020   Procedure: COLONOSCOPY;  Surgeon: Pasty Spillers, MD;  Location: [facility];  Service: Endoscopy;  Laterality: N/A;   COLONOSCOPY WITH PROPOFOL N/A 05/17/2021   Procedure: COLONOSCOPY WITH PROPOFOL;  Surgeon: Pasty Spillers, MD;  Location: [facility];  Service: Endoscopy;  Laterality: N/A;   CORONARY ARTERY BYPASS GRAFT  08/30/2016   triple   CORONARY STENT INTERVENTION N/A 06/09/2020   Procedure: CORONARY STENT INTERVENTION;  Surgeon: Alwyn Pea, MD;  Location: [facility];  Service: Cardiovascular;  Laterality: N/A;   ENDOSCOPIC MUCOSAL RESECTION N/A 09/22/2020   Procedure: ENDOSCOPIC MUCOSAL RESECTION;  Surgeon: Meridee Score Netty Starring., MD;  Location: [facility] END[facility]COPY;  Service: Gastroenterology;  Laterality: N/A;   ESOPHAGOGASTRODUODENOSCOPY N/A 09/07/2020   Procedure: ESOPHAGOGASTRODUODENOSCOPY (EGD);  Surgeon: Pasty Spillers, MD;  Location: [facility]  ENDOSCOPY;  Service: Endoscopy;  Laterality: N/A;   ESOPHAGOGASTRODUODENOSCOPY (EGD) WITH PROPOFOL N/A 09/22/2020   Procedure: ESOPHAGOGASTRODUODENOSCOPY (EGD) WITH PROPOFOL;  Surgeon: Meridee Score Netty Starring., MD;  Location: [facility] ENDOSCOPY;  Service: Gastroenterology;  Laterality: N/A;   FRACTURE SURGERY     HEMOSTASIS CLIP PLACEMENT  09/22/2020   Procedure: HEMOSTASIS CLIP PLACEMENT;  Surgeon: Lemar Lofty., MD;  Location: [facility] ENDOSCOPY;  Service: Gastroenterology;;   HEMOSTASIS CONTROL  09/22/2020   Procedure: HEMOSTASIS CONTROL;  Surgeon: Lemar Lofty., MD;  Location: [place] [facility];  Service: Gastroenterology;;   LEFT HEART CATH AND CORONARY ANGIOGRAPHY Left 06/09/2020   Procedure: LEFT HEART CATH AND CORONARY ANGIOGRAPHY;  Surgeon: Alwyn Pea, MD;  Location: [facility];  Service: Cardiovascular;  Laterality: Left;   LEFT HEART CATH AND CORS/GRAFTS ANGIOGRAPHY Left 11/20/2016   Procedure: Left Heart Cath and Cors/Grafts Angiography;  Surgeon: Alwyn Pea, MD;  Location: [facility];  Service: Cardiovascular;  Laterality: Left;   POLYPECTOMY  09/22/2020   Procedure: POLYPECTOMY;  Surgeon: Mansouraty, Netty Starring., MD;  Location: [facility] ENDOSCOPY;  Service: Gastroenterology;;   SUBMUCOSAL LIFTING INJECTION  09/22/2020   Procedure: SUBMUCOSAL LIFTING INJECTION;  Surgeon: Lemar Lofty., MD;  Location: [facility] ENDOSCOPY;  Service: Gastroenterology;;    Home Medications:  Allergies as of 07/23/2022       Reactions   Isosorbide Cough        Medication List        Accurate as of July 23, 2022 10:25 AM. If you have any questions, ask your nurse or doctor.          acetaminophen 325 MG tablet Commonly known as: TYLENOL Take 2 tablets (650 mg total) by mouth every 6 (six) hours as needed for mild pain (or Fever >/= 101).   amLODipine 10 MG tablet Commonly known as: NORVASC Take 10 mg by mouth daily.   aspirin EC 81 MG  tablet Take 81 mg by mouth daily. Swallow whole.   clopidogrel 75 MG tablet Commonly known as: PLAVIX Take 75 mg by mouth daily.   dexlansoprazole 60 MG capsule Commonly known as: DEXILANT Take 1 capsule by mouth daily.   hydrALAZINE 50 MG tablet Commonly known as: APRESOLINE Take 1 tablet by mouth 3 (three) times daily.   hydrochlorothiazide 12.5 MG tablet Commonly known as: HYDRODIURIL Take 12.5 mg by mouth daily.   insulin glargine 100 UNIT/ML injection Commonly known as: LANTUS Inject 25 Units into the skin at bedtime.   irbesartan 300 MG tablet Commonly known as: AVAPRO Take 1 tablet (300 mg total) by mouth daily.   labetalol 100 MG  tablet Commonly known as: NORMODYNE Take 1 tablet by mouth 2 (two) times daily.   metFORMIN 500 MG 24 hr tablet Commonly known as: GLUCOPHAGE-XR Take 500 mg by mouth 2 (two) times daily.   nebivolol 10 MG tablet Commonly known as: BYSTOLIC Take 10 mg by mouth daily.   rosuvastatin 40 MG tablet Commonly known as: CRESTOR Take 40 mg by mouth daily.   sitaGLIPtin 100 MG tablet Commonly known as: JANUVIA Take 100 mg by mouth daily.   Testosterone 1.62 % Gel Apply 2 Pump topically daily. One pump on each arm        Allergies:  Allergies  Allergen Reactions   Isosorbide Cough    Family History: No family history on file.  Social History:  reports that he has never smoked. He has never used smokeless tobacco. He reports that he does not drink alcohol and does not use drugs.   Physical Exam: BP (!) 154/74 (BP Location: Left Arm, Patient Position: Sitting, Cuff Size: Normal)   Pulse 78   Ht 6\' 1"  (1.854 m)   Wt 237 lb (107.5 kg)   BMI 31.27 kg/m   Constitutional:  Alert and oriented, No acute distress. HEENT: [place] AT, moist mucus membranes.  Trachea midline Cardiovascular: No clubbing, cyanosis, or bilateral pedal edema. Respiratory: Normal respiratory effort, no increased work of breathing. Neurologic: Grossly  intact, no focal deficits, moving all 4 extremities. Psychiatric: Normal mood and affect.   Laboratory Data: See Urological History and HPI and EPIC. I have reviewed the labs.   Pertinent Imaging N/A    Assessment & Plan:    1. Testosterone deficiency  -testosterone level is super therapeutic -Continue AndroGel 1.62%, 2 pumps/1 pump rotation daily making sure it is applied daily  2. BPH with LUTS  - PSA is stable  - symptoms - frequency  -failed OAB medications -continue conservative management, avoiding bladder irritants and timed voiding's -He would like to proceed with cystoscopy/TRUS sometime in the near future, but he would like to wait till his grandchildren are out of school so he is not responsible for further transportation  3. Anemia  - followed by hematology  4. Proteinuria ->300 protein on last serial UA's - explained that damage to the kidney is typically the culprit  -printed the results so that he can take them to his PCP for further evaluation  Return in about 6 months (around 01/22/2023) for PSA, testosterone, hemoglobin and hematocrit, SHIM, I PSS and exam .  Cloretta Ned  [facility] [address], [address] [place], [place] [address] [phone]

## 2022-07-21 LAB — TESTOSTERONE: Testosterone: 1183 ng/dL — ABNORMAL HIGH (ref 264–916)

## 2022-07-23 ENCOUNTER — Other Ambulatory Visit
Admission: RE | Admit: 2022-07-23 | Discharge: 2022-07-23 | Disposition: A | Discharge status: Home / Self Care | Payer: Medicare HMO | Attending: Urology | Admitting: Urology

## 2022-07-23 ENCOUNTER — Ambulatory Visit: Payer: Medicare HMO | Admitting: Urology

## 2022-07-23 ENCOUNTER — Other Ambulatory Visit: Payer: Self-pay

## 2022-07-23 ENCOUNTER — Encounter: Payer: Self-pay | Admitting: Urology

## 2022-07-23 VITALS — BP 154/74 | HR 78 | Ht 73.0 in | Wt 237.0 lb

## 2022-07-23 DIAGNOSIS — E349 Endocrine disorder, unspecified: Secondary | ICD-10-CM | POA: Insufficient documentation

## 2022-07-23 DIAGNOSIS — N401 Enlarged prostate with lower urinary tract symptoms: Secondary | ICD-10-CM | POA: Diagnosis present

## 2022-07-23 DIAGNOSIS — E291 Testicular hypofunction: Secondary | ICD-10-CM | POA: Diagnosis not present

## 2022-07-23 DIAGNOSIS — R808 Other proteinuria: Secondary | ICD-10-CM | POA: Diagnosis not present

## 2022-07-23 DIAGNOSIS — N138 Other obstructive and reflux uropathy: Secondary | ICD-10-CM | POA: Diagnosis present

## 2022-07-23 DIAGNOSIS — N529 Male erectile dysfunction, unspecified: Secondary | ICD-10-CM

## 2022-07-23 LAB — BLADDER SCAN AMB NON-IMAGING

## 2022-07-23 LAB — URINALYSIS, COMPLETE (UACMP) WITH MICROSCOPIC
Bilirubin Urine: NEGATIVE
Glucose, UA: NEGATIVE mg/dL
Hgb urine dipstick: NEGATIVE
Ketones, ur: NEGATIVE mg/dL
Leukocytes,Ua: NEGATIVE
Nitrite: NEGATIVE
Protein, ur: >300 mg/dL — AB
RBC / HPF: NONE SEEN RBC/hpf (ref 0–5)
Specific Gravity, Urine: 1.02 (ref 1.005–1.030)
Squamous Epithelial / HPF: NONE SEEN (ref 0–5)
pH: 6.5 (ref 5.0–8.0)

## 2022-07-23 LAB — HEMOGLOBIN AND HEMATOCRIT, BLOOD
HCT: 34.8 % — ABNORMAL LOW (ref 39.0–52.0)
Hemoglobin: 11.2 g/dL — ABNORMAL LOW (ref 13.0–17.0)

## 2022-07-23 MED ORDER — TESTOSTERONE 1.62 % TD GEL: 2.0000 | Freq: Every day | TRANSDERMAL | 3 refills | Status: DC

## 2022-08-20 IMAGING — US US THYROID
1 series · 13 of 25 positions shown · non-contrast
Comparison: 07/19/2009

CLINICAL DATA: Thyroid nodule follow-up

EXAM:
THYROID ULTRASOUND
TECHNIQUE: Ultrasound examination of the thyroid gland and adjacent soft
tissues was performed.

[Series 1: us thyroid · 0.10mm/px · 13 of 60 slices shown]
[im 1/60]
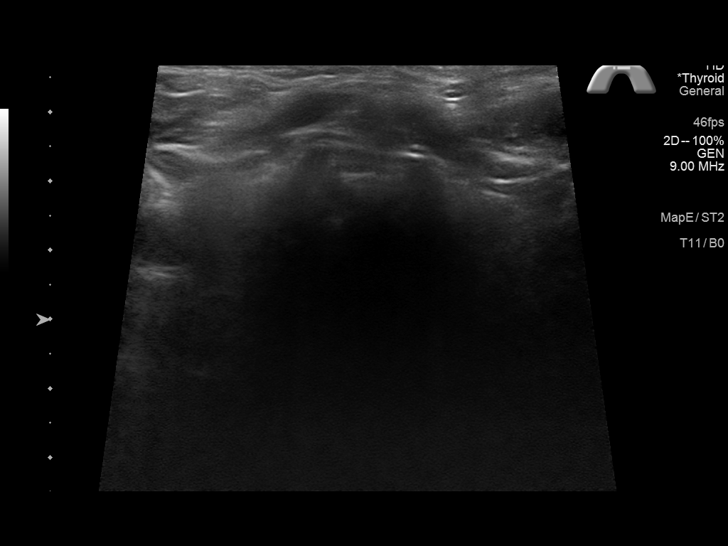
[im 5/60]
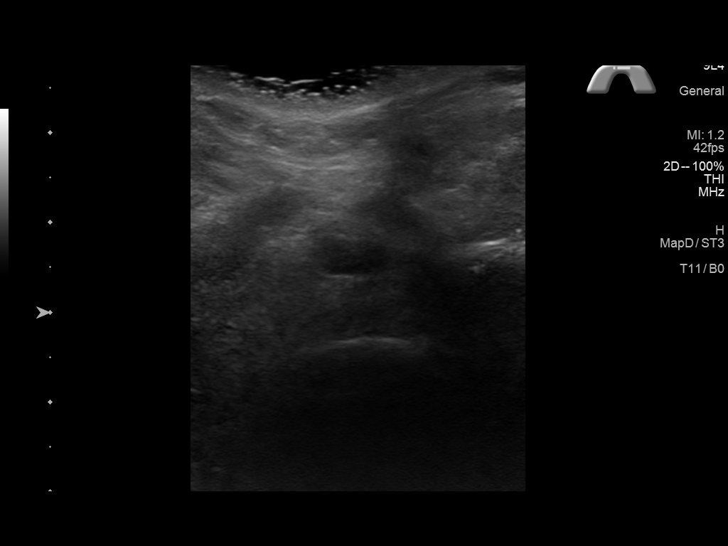
[im 10/60]
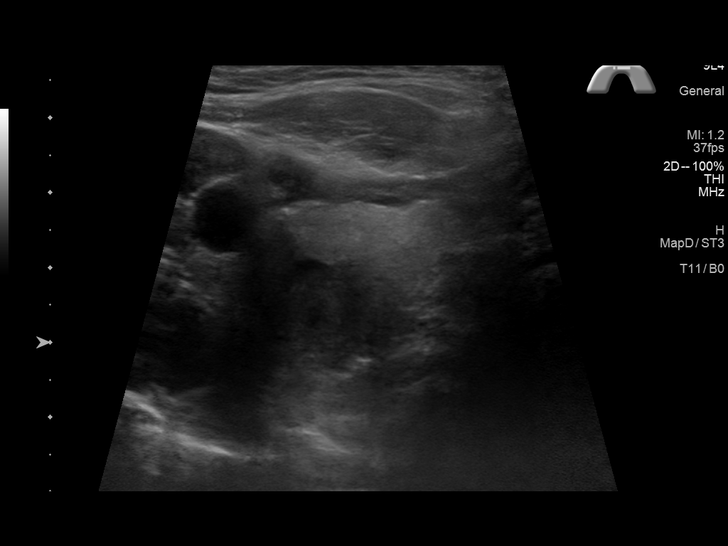
[im 15/60]
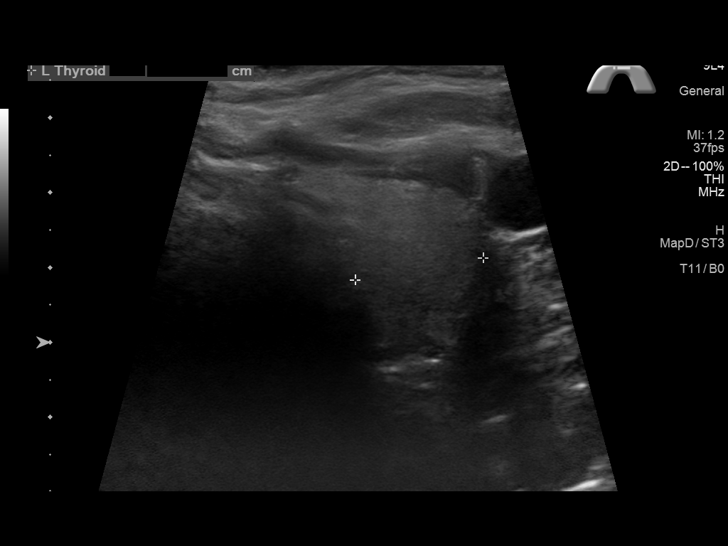
[im 20/60]
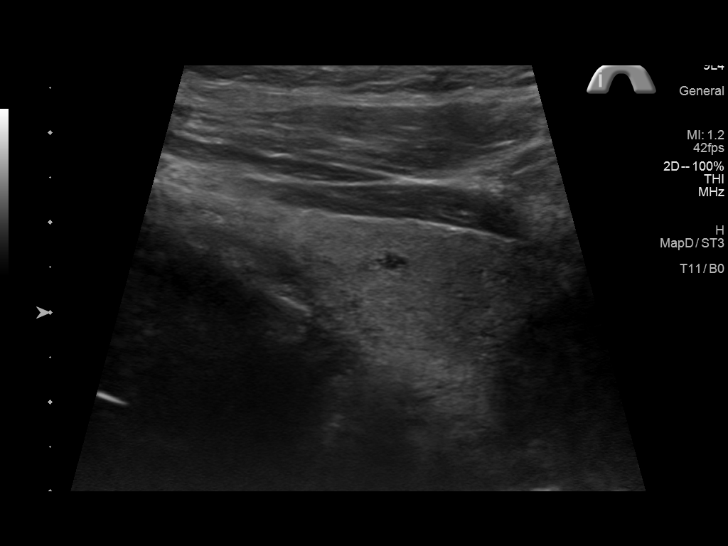
[im 25/60]
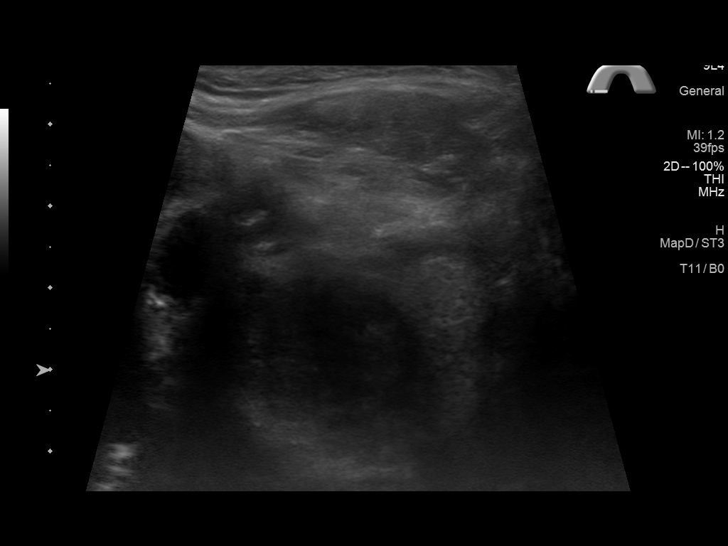
[im 30/60]
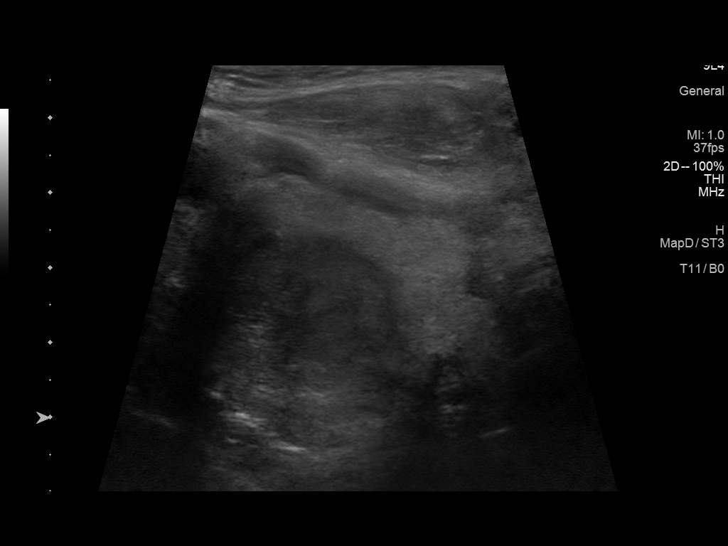
[im 35/60]
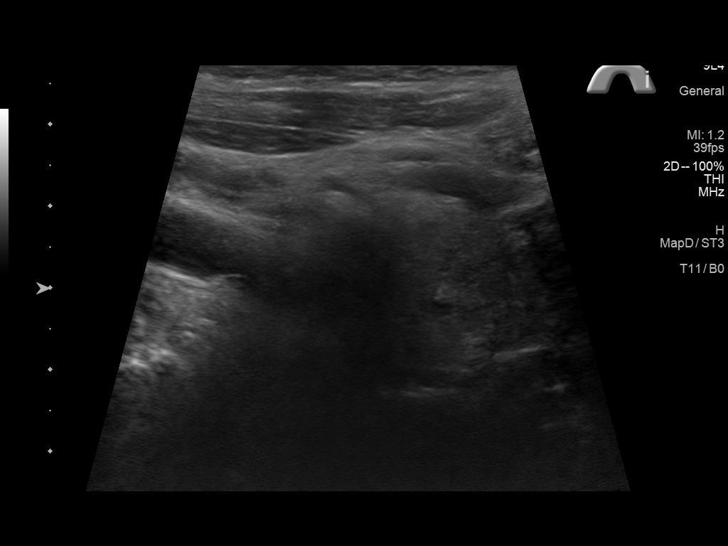
[im 40/60]
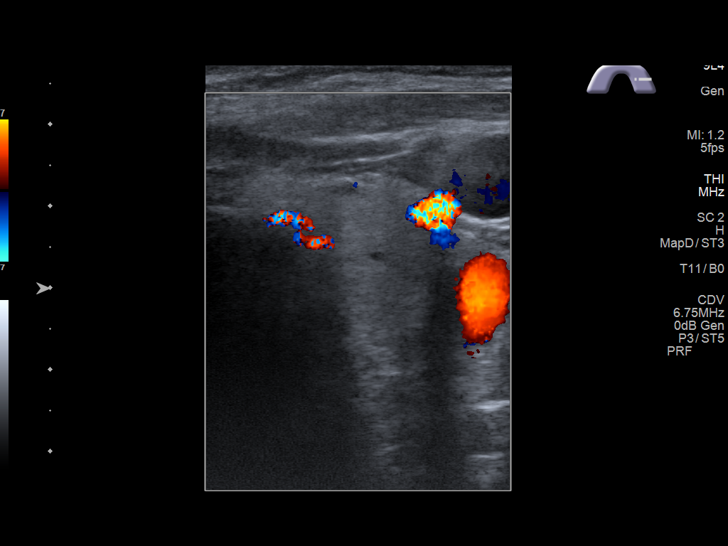
[im 45/60]
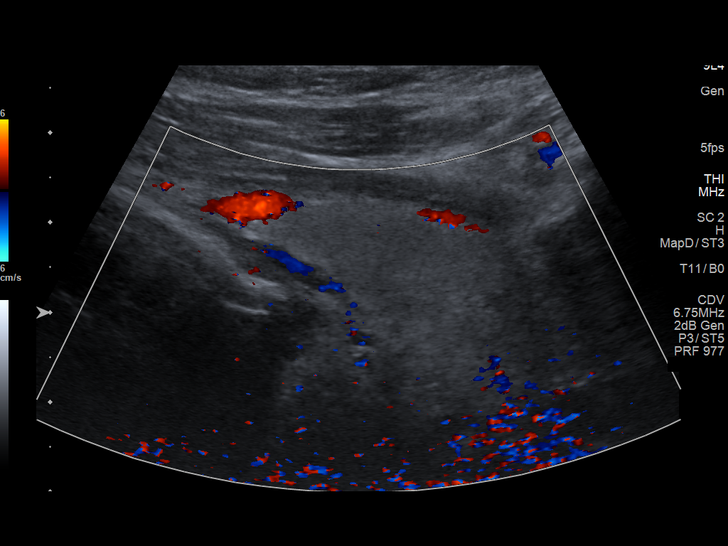
[im 50/60]
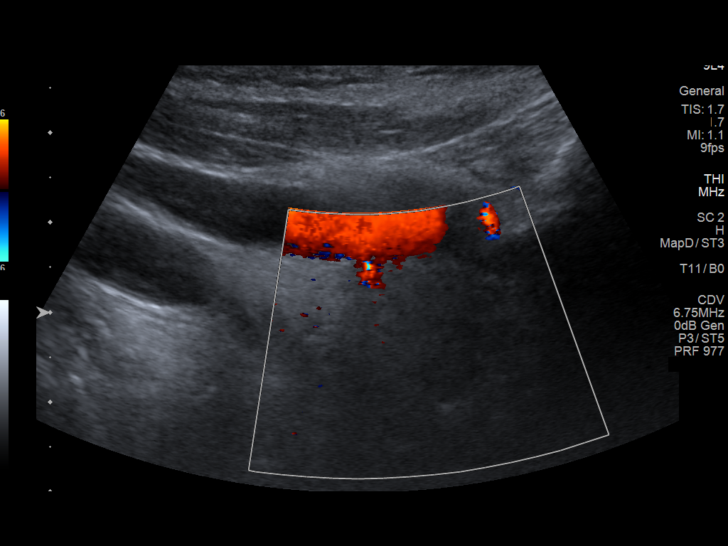
[im 55/60]
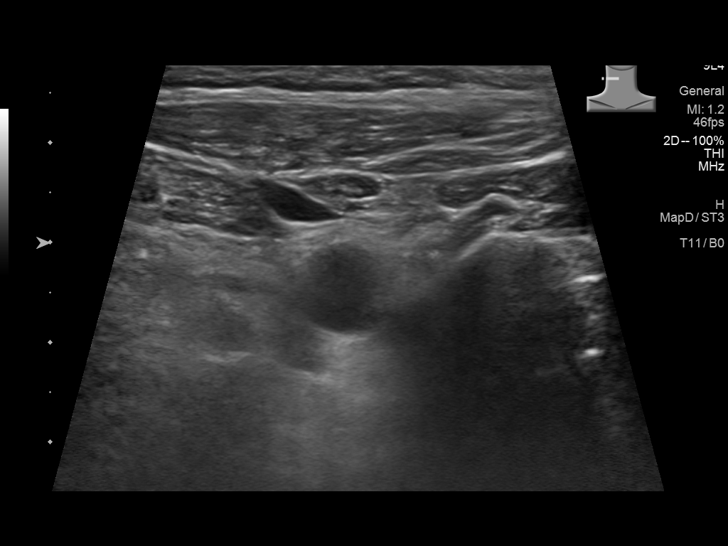
[im 60/60]
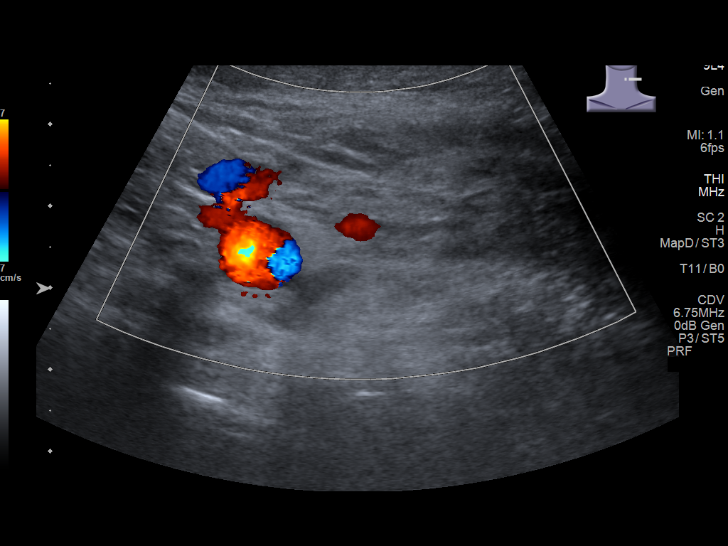

[13 of 25 positions shown; findings below may reference images not displayed]

FINDINGS: Parenchymal Echotexture: Moderately heterogenous

Isthmus: 1.4 cm

Right lobe: 4.9 x 2.2 x 2.4 cm

Left lobe: 4 x 2.4 x 1.7 cm

_________________________________________________________

Estimated total number of nodules >/= 1 cm: 1

Number of spongiform nodules >/=  2 cm not described below (TR1): 0

Number of mixed cystic and solid nodules >/= 1.5 cm not described
below (TR2): 0

_________________________________________________________

Examination is limited by patient body habitus. The thyroid gland is
deep beneath significant subcutaneous soft tissue.

Nodule # 1:

Prior biopsy: No

Location: Right; Inferior

Maximum size: 2.9 cm; Other 2 dimensions: 2.8 x 2.8 cm, previously,
3.3 x 2.8 x 2.7 cm

Composition: solid/almost completely solid (2)

Echogenicity: isoechoic (1)

Shape: not taller-than-wide (0)

Margins: ill-defined (0)

Echogenic foci: none (0)

ACR TI-RADS total points: 3.

ACR TI-RADS risk category:  TR3 (3 points).

Significant change in size (>/= 20% in two dimensions and minimal
increase of 2 mm): No

Change in features: No

Change in ACR TI-RADS risk category: No

ACR TI-RADS recommendations:

**Given size (>/= 2.5 cm) and appearance, fine needle aspiration of
this mildly suspicious nodule should be considered based on TI-RADS
criteria.

_________________________________________________________

There is a small subcentimeter thyroid nodule in the left inferior
thyroid gland measuring up to approximately 0.8 cm. This does not
meet criteria for follow-up or fine-needle aspiration.
IMPRESSION: 1. Very limited study secondary to depth of the thyroid gland and
patient body habitus.
2. Persistent TR 3 thyroid nodule in the right inferior thyroid
gland measuring up to 2.9 cm (previously measuring 3.3 cm). While
this thyroid nodule meets criteria for fine-needle aspiration, its
stability across greater than 10 years is consistent with a benign
process.

The above is in keeping with the ACR TI-RADS recommendations - [HOSPITAL] 4825;[DATE].

## 2022-08-21 ENCOUNTER — Inpatient Hospital Stay: Payer: Medicare HMO | Admitting: Oncology

## 2022-08-21 ENCOUNTER — Inpatient Hospital Stay: Payer: Medicare HMO | Attending: Oncology

## 2022-08-21 ENCOUNTER — Encounter: Payer: Self-pay | Admitting: Oncology

## 2022-08-21 DIAGNOSIS — D649 Anemia, unspecified: Secondary | ICD-10-CM

## 2022-08-21 DIAGNOSIS — D638 Anemia in other chronic diseases classified elsewhere: Secondary | ICD-10-CM

## 2022-08-21 DIAGNOSIS — N189 Chronic kidney disease, unspecified: Secondary | ICD-10-CM | POA: Insufficient documentation

## 2022-08-21 DIAGNOSIS — D509 Iron deficiency anemia, unspecified: Secondary | ICD-10-CM | POA: Insufficient documentation

## 2022-08-21 DIAGNOSIS — D508 Other iron deficiency anemias: Secondary | ICD-10-CM

## 2022-08-21 DIAGNOSIS — E538 Deficiency of other specified B group vitamins: Secondary | ICD-10-CM | POA: Insufficient documentation

## 2022-08-21 LAB — CBC WITH DIFFERENTIAL/PLATELET
Abs Immature Granulocytes: 0.03 10*3/uL (ref 0.00–0.07)
Basophils Absolute: 0.1 10*3/uL (ref 0.0–0.1)
Basophils Relative: 1 %
Eosinophils Absolute: 0.1 10*3/uL (ref 0.0–0.5)
Eosinophils Relative: 2 %
HCT: 34.8 % — ABNORMAL LOW (ref 39.0–52.0)
Hemoglobin: 11.1 g/dL — ABNORMAL LOW (ref 13.0–17.0)
Immature Granulocytes: 1 %
Lymphocytes Relative: 8 %
Lymphs Abs: 0.5 10*3/uL — ABNORMAL LOW (ref 0.7–4.0)
MCH: 31 pg (ref 26.0–34.0)
MCHC: 31.9 g/dL (ref 30.0–36.0)
MCV: 97.2 fL (ref 80.0–100.0)
Monocytes Absolute: 0.4 10*3/uL (ref 0.1–1.0)
Monocytes Relative: 7 %
Neutro Abs: 4.5 10*3/uL (ref 1.7–7.7)
Neutrophils Relative %: 81 %
Platelets: 118 10*3/uL — ABNORMAL LOW (ref 150–400)
RBC: 3.58 MIL/uL — ABNORMAL LOW (ref 4.22–5.81)
RDW: 17.8 % — ABNORMAL HIGH (ref 11.5–15.5)
WBC: 5.5 10*3/uL (ref 4.0–10.5)
nRBC: 0 % (ref 0.0–0.2)

## 2022-08-21 LAB — IRON AND TIBC
Iron: 68 ug/dL (ref 45–182)
Saturation Ratios: 23 % (ref 17.9–39.5)
TIBC: 298 ug/dL (ref 250–450)
UIBC: 230 ug/dL

## 2022-08-21 LAB — VITAMIN B12: Vitamin B-12: 232 pg/mL (ref 180–914)

## 2022-08-21 LAB — FERRITIN: Ferritin: 146 ng/mL (ref 24–336)

## 2022-08-21 NOTE — Progress Notes (Signed)
Hematology/Oncology Consult note [facility]  [phone] [phone]  Patient Care Team: Albina Billet, MD as PCP - General (Internal Medicine) Michael Boston, MD as Consulting Physician (General Surgery) Virgel Manifold, MD (Inactive) as Consulting Physician (Gastroenterology) Yolonda Kida, MD as Consulting Physician (Cardiology)   Name of the patient: [name]  [id number]  [birth year]   Date of visit: 08/21/22  Diagnosis-anemia likely secondary to chronic disease as well as component of iron deficiency  Chief complaint/ Reason for visit-routine follow-up of anemia  Heme/Onc history: Patient is a 77 year old male with history of multiple gastric polyps who was seen by both Dr. Rush Landmark and Dr. Bonna Gains.  He has undergone EGD with Dr. Rush Landmark and had a large gastric hyperplastic polyp removal.  Prior to that patient had significant iron deficiency and his aspirin and Plavix were on hold.  These were restarted after EGD.  Most recent CBC on 11/01/2020 showed white count of 6.8, H&H of 11.3/36.4 with an MCV of 85 and a platelet count of 158.  Iron studies were normal.  Patient denies any bleeding in his stool or dark melanotic stools.   Patient received 5 doses of Venofer in October 2022 and his hemoglobin improved from 7-12  Interval history-patient follows up with cardiology for his chronic cardiac issues.  He gets occasional episodes ofChest wall pain.  Has baseline fatigue  ECOG PS- 1 Pain scale- 2   Review of systems- Review of Systems  Constitutional:  Positive for malaise/fatigue. Negative for chills, fever and weight loss.  HENT:  Negative for congestion, ear discharge and nosebleeds.   Eyes:  Negative for blurred vision.  Respiratory:  Negative for cough, hemoptysis, sputum production, shortness of breath and wheezing.   Cardiovascular:  Negative for chest pain, palpitations, orthopnea and claudication.   Gastrointestinal:  Negative for abdominal pain, blood in stool, constipation, diarrhea, heartburn, melena, nausea and vomiting.  Genitourinary:  Negative for dysuria, flank pain, frequency, hematuria and urgency.  Musculoskeletal:  Negative for back pain, joint pain and myalgias.  Skin:  Negative for rash.  Neurological:  Negative for dizziness, tingling, focal weakness, seizures, weakness and headaches.  Endo/Heme/Allergies:  Does not bruise/bleed easily.  Psychiatric/Behavioral:  Negative for depression and suicidal ideas. The patient does not have insomnia.       Allergies  Allergen Reactions   Isosorbide Cough     Past Medical History:  Diagnosis Date   Anemia    Arthritis    Atrial fibrillation (HCC)    AV block, 1st degree    Bundle branch block    Chronic kidney disease    patient not aware of this dx   Coronary artery disease    Diabetes mellitus without complication (HCC)    type 2   Dyspnea    GERD (gastroesophageal reflux disease)    History of blood transfusion    Hypertension    Sleep apnea    does not use cpap     Past Surgical History:  Procedure Laterality Date   APPENDECTOMY     CARDIAC CATHETERIZATION Left 08/16/2016   Procedure: Left Heart Cath and Coronary Angiography;  Surgeon: Yolonda Kida, MD;  Location: [place] [facility];  Service: Cardiovascular;  Laterality: Left;   COLON SURGERY     part of colon removed   COLONOSCOPY N/A 09/07/2020   Procedure: COLONOSCOPY;  Surgeon: Virgel Manifold, MD;  Location: [facility];  Service: Endoscopy;  Laterality: N/A;   COLONOSCOPY  WITH PROPOFOL N/A 05/17/2021   Procedure: COLONOSCOPY WITH PROPOFOL;  Surgeon: Virgel Manifold, MD;  Location: [facility];  Service: Endoscopy;  Laterality: N/A;   CORONARY ARTERY BYPASS GRAFT  08/30/2016   triple   CORONARY STENT INTERVENTION N/A 06/09/2020   Procedure: CORONARY STENT INTERVENTION;  Surgeon: Yolonda Kida, MD;  Location: [place] [facility];  Service: Cardiovascular;  Laterality: N/A;   ENDOSCOPIC MUCOSAL RESECTION N/A 09/22/2020   Procedure: ENDOSCOPIC MUCOSAL RESECTION;  Surgeon: Rush Landmark Telford Nab., MD;  Location: [place];  Service: Gastroenterology;  Laterality: N/A;   ESOPHAGOGASTRODUODENOSCOPY N/A 09/07/2020   Procedure: ESOPHAGOGASTRODUODENOSCOPY (EGD);  Surgeon: Virgel Manifold, MD;  Location: [facility]COPY;  Service: Endoscopy;  Laterality: N/A;   ESOPHAGOGASTRODUODENOSCOPY (EGD) WITH PROPOFOL N/A 09/22/2020   Procedure: ESOPHAGOGASTRODUODENOSCOPY (EGD) WITH PROPOFOL;  Surgeon: Rush Landmark Telford Nab., MD;  Location: [place];  Service: Gastroenterology;  Laterality: N/A;   FRACTURE SURGERY     HEMOSTASIS CLIP PLACEMENT  09/22/2020   Procedure: HEMOSTASIS CLIP PLACEMENT;  Surgeon: Irving Copas., MD;  Location: [place];  Service: Gastroenterology;;   HEMOSTASIS CONTROL  09/22/2020   Procedure: HEMOSTASIS CONTROL;  Surgeon: Irving Copas., MD;  Location: [place];  Service: Gastroenterology;;   LEFT HEART CATH AND CORONARY ANGIOGRAPHY Left 06/09/2020   Procedure: LEFT HEART CATH AND CORONARY ANGIOGRAPHY;  Surgeon: Yolonda Kida, MD;  Location: [place] [facility];  Service: Cardiovascular;  Laterality: Left;   LEFT HEART CATH AND CORS/GRAFTS ANGIOGRAPHY Left 11/20/2016   Procedure: Left Heart Cath and Cors/Grafts Angiography;  Surgeon: Yolonda Kida, MD;  Location: [place] [facility];  Service: Cardiovascular;  Laterality: Left;   POLYPECTOMY  09/22/2020   Procedure: POLYPECTOMY;  Surgeon: Mansouraty, Telford Nab., MD;  Location: [place];  Service: Gastroenterology;;   SUBMUCOSAL LIFTING INJECTION  09/22/2020   Procedure: SUBMUCOSAL LIFTING INJECTION;  Surgeon: Irving Copas., MD;  Location: [facility] ENDOSCOPY;  Service: Gastroenterology;;    Social History   Socioeconomic History   Marital status: Married    Spouse name: Not on file    Number of children: Not on file   Years of education: Not on file   Highest education level: Not on file  Occupational History   Not on file  Tobacco Use   Smoking status: Never   Smokeless tobacco: Never  Vaping Use   Vaping Use: Never used  Substance and Sexual Activity   Alcohol use: No   Drug use: No   Sexual activity: Not Currently  Other Topics Concern   Not on file  Social History Narrative   Not on file   Social Determinants of Health   Financial Resource Strain: Not on file  Food Insecurity: Not on file  Transportation Needs: Not on file  Physical Activity: Not on file  Stress: Not on file  Social Connections: Not on file  Intimate Partner Violence: Not on file    History reviewed. No pertinent family history.   Current Outpatient Medications:    amLODipine (NORVASC) 10 MG tablet, Take 10 mg by mouth daily., Disp: , Rfl:    aspirin EC 81 MG tablet, Take 81 mg by mouth daily. Swallow whole., Disp: , Rfl:    clopidogrel (PLAVIX) 75 MG tablet, Take 75 mg by mouth daily., Disp: , Rfl:    dexlansoprazole (DEXILANT) 60 MG capsule, Take 1 capsule by mouth daily., Disp: , Rfl:    hydrALAZINE (APRESOLINE) 50 MG tablet, Take 1 tablet by mouth 3 (three) times daily.,  Disp: , Rfl:    hydrochlorothiazide (HYDRODIURIL) 12.5 MG tablet, Take 12.5 mg by mouth daily., Disp: , Rfl:    insulin glargine (LANTUS) 100 UNIT/ML injection, Inject 25 Units into the skin at bedtime. , Disp: , Rfl:    irbesartan (AVAPRO) 300 MG tablet, Take 1 tablet (300 mg total) by mouth daily., Disp: 30 tablet, Rfl: 1   labetalol (NORMODYNE) 100 MG tablet, Take 1 tablet by mouth 2 (two) times daily., Disp: , Rfl:    metFORMIN (GLUCOPHAGE-XR) 500 MG 24 hr tablet, Take 500 mg by mouth 2 (two) times daily., Disp: , Rfl:    nebivolol (BYSTOLIC) 10 MG tablet, Take 10 mg by mouth daily., Disp: , Rfl:    rosuvastatin (CRESTOR) 40 MG tablet, Take 40 mg by mouth daily., Disp: , Rfl:    sitaGLIPtin (JANUVIA) 100  MG tablet, Take 100 mg by mouth daily., Disp: , Rfl:    Testosterone 1.62 % GEL, Apply 2 Pump topically daily. One pump on each arm, Disp: 225 g, Rfl: 3   acetaminophen (TYLENOL) 325 MG tablet, Take 2 tablets (650 mg total) by mouth every 6 (six) hours as needed for mild pain (or Fever >/= 101). (Patient not taking: Reported on 08/21/2022), Disp: , Rfl:   Physical exam: There were no vitals filed for this visit. Physical Exam Constitutional:      General: He is not in acute distress. Cardiovascular:     Rate and Rhythm: Normal rate and regular rhythm.     Heart sounds: Murmur heard.  Pulmonary:     Effort: Pulmonary effort is normal.     Breath sounds: Normal breath sounds.  Abdominal:     General: Bowel sounds are normal.     Palpations: Abdomen is soft.  Skin:    General: Skin is warm and dry.  Neurological:     Mental Status: He is alert and oriented to person, place, and time.         Latest Ref Rng & Units 09/08/2020    6:50 AM  CMP  Glucose 70 - 99 mg/dL 214   BUN 8 - 23 mg/dL 12   Creatinine 0.61 - 1.24 mg/dL 1.19   Sodium 135 - 145 mmol/L 139   Potassium 3.5 - 5.1 mmol/L 3.5   Chloride 98 - 111 mmol/L 104   CO2 22 - 32 mmol/L 26   Calcium 8.9 - 10.3 mg/dL 9.8   Total Protein 6.5 - 8.1 g/dL 6.4   Total Bilirubin 0.3 - 1.2 mg/dL 0.4   Alkaline Phos 38 - 126 U/L 88   AST 15 - 41 U/L 24   ALT 0 - 44 U/L 20       Latest Ref Rng & Units 08/21/2022   10:45 AM  CBC  WBC 4.0 - 10.5 K/uL 5.5   Hemoglobin 13.0 - 17.0 g/dL 11.1   Hematocrit 39.0 - 52.0 % 34.8   Platelets 150 - 400 K/uL 118      Assessment and plan- Patient is a 77 y.o. male here for routine follow-up of anemia of chronic disease  Patient last received IV iron in July 2023.  Presently his hemoglobin is 11.1 which is close to his baseline.  Iron studies are not indicated of iron deficiency.  He does not require any IV iron at this time.  B12 levels are mildly low at 232 and we will confirm if patient is  taking any oral B12 or if he would like to get started on  B12 injections.  Repeat CBC ferritin and iron studies in 4 and 8 months and I will see him back in 8 months   Visit Diagnosis 1. Other iron deficiency anemia   2. Anemia of chronic disease      Dr. Randa Evens, MD, MPH [facility] [phone] 08/21/2022 6:02 PM

## 2022-08-24 ENCOUNTER — Telehealth: Payer: Self-pay | Admitting: *Deleted

## 2022-08-24 NOTE — Telephone Encounter (Signed)
Pt called me back and said he has not been on b12 pills for along time and he will get more and start soon 1046mg daily . He is aware.

## 2022-08-24 NOTE — Telephone Encounter (Signed)
-----  Message from Sindy Guadeloupe, MD sent at 08/21/2022  6:07 PM EST ----- Polease check if patient is taking po b12 1000 mccg daily. His levels are low. If he is not on po b12- he should start that. If he is taking that we can switch him to monthly injections

## 2022-09-10 ENCOUNTER — Encounter: Payer: Self-pay | Admitting: Internal Medicine

## 2022-09-10 DIAGNOSIS — I25118 Atherosclerotic heart disease of native coronary artery with other forms of angina pectoris: Secondary | ICD-10-CM

## 2022-09-11 ENCOUNTER — Other Ambulatory Visit: Payer: Self-pay | Admitting: Internal Medicine

## 2022-09-11 DIAGNOSIS — I25118 Atherosclerotic heart disease of native coronary artery with other forms of angina pectoris: Secondary | ICD-10-CM

## 2022-09-11 DIAGNOSIS — I447 Left bundle-branch block, unspecified: Secondary | ICD-10-CM

## 2022-09-19 ENCOUNTER — Ambulatory Visit
Admission: RE | Admit: 2022-09-19 | Discharge: 2022-09-19 | Disposition: A | Discharge status: Home / Self Care | Payer: Medicare HMO | Source: Ambulatory Visit | Attending: Internal Medicine | Admitting: Internal Medicine

## 2022-09-19 ENCOUNTER — Other Ambulatory Visit: Payer: Self-pay | Admitting: Internal Medicine

## 2022-09-19 DIAGNOSIS — Z8673 Personal history of transient ischemic attack (TIA), and cerebral infarction without residual deficits: Secondary | ICD-10-CM | POA: Diagnosis not present

## 2022-09-19 DIAGNOSIS — Z87891 Personal history of nicotine dependence: Secondary | ICD-10-CM | POA: Diagnosis not present

## 2022-09-19 DIAGNOSIS — Z136 Encounter for screening for cardiovascular disorders: Secondary | ICD-10-CM | POA: Diagnosis not present

## 2022-09-19 DIAGNOSIS — I25118 Atherosclerotic heart disease of native coronary artery with other forms of angina pectoris: Secondary | ICD-10-CM | POA: Diagnosis present

## 2022-09-19 DIAGNOSIS — I447 Left bundle-branch block, unspecified: Secondary | ICD-10-CM | POA: Insufficient documentation

## 2022-09-24 DIAGNOSIS — I272 Pulmonary hypertension, unspecified: Secondary | ICD-10-CM

## 2022-09-24 HISTORY — DX: Pulmonary hypertension, unspecified: I27.20

## 2022-12-03 ENCOUNTER — Other Ambulatory Visit
Admission: RE | Admit: 2022-12-03 | Discharge: 2022-12-03 | Disposition: A | Discharge status: Home / Self Care | Payer: Medicare HMO | Source: Ambulatory Visit | Attending: Internal Medicine | Admitting: Internal Medicine

## 2022-12-03 DIAGNOSIS — I25118 Atherosclerotic heart disease of native coronary artery with other forms of angina pectoris: Secondary | ICD-10-CM | POA: Diagnosis not present

## 2022-12-03 DIAGNOSIS — Z01810 Encounter for preprocedural cardiovascular examination: Secondary | ICD-10-CM | POA: Insufficient documentation

## 2022-12-03 DIAGNOSIS — Z79899 Other long term (current) drug therapy: Secondary | ICD-10-CM | POA: Diagnosis not present

## 2022-12-03 DIAGNOSIS — R0602 Shortness of breath: Secondary | ICD-10-CM | POA: Insufficient documentation

## 2022-12-03 DIAGNOSIS — I10 Essential (primary) hypertension: Secondary | ICD-10-CM | POA: Diagnosis not present

## 2022-12-03 LAB — BRAIN NATRIURETIC PEPTIDE: B Natriuretic Peptide: 861.1 pg/mL — ABNORMAL HIGH (ref 0.0–100.0)

## 2022-12-20 ENCOUNTER — Other Ambulatory Visit: Payer: Medicare HMO

## 2022-12-20 ENCOUNTER — Inpatient Hospital Stay
Admission: EM | Admit: 2022-12-20 | Discharge: 2022-12-27 | DRG: 291 | Disposition: A | Discharge status: Home / Self Care | Payer: Medicare HMO | Attending: Internal Medicine | Admitting: Internal Medicine

## 2022-12-20 ENCOUNTER — Encounter: Payer: Self-pay | Admitting: Internal Medicine

## 2022-12-20 ENCOUNTER — Ambulatory Visit
Admission: RE | Admit: 2022-12-20 | Discharge: 2022-12-20 | Disposition: A | Discharge status: Home / Self Care | Payer: Medicare HMO | Source: Home / Self Care | Attending: Internal Medicine | Admitting: Internal Medicine

## 2022-12-20 ENCOUNTER — Encounter
Admission: RE | Disposition: A | Discharge status: Home / Self Care | Payer: Self-pay | Source: Home / Self Care | Attending: Internal Medicine

## 2022-12-20 ENCOUNTER — Inpatient Hospital Stay: Payer: Medicare HMO

## 2022-12-20 ENCOUNTER — Emergency Department: Payer: Medicare HMO

## 2022-12-20 ENCOUNTER — Other Ambulatory Visit: Payer: Self-pay

## 2022-12-20 DIAGNOSIS — I071 Rheumatic tricuspid insufficiency: Secondary | ICD-10-CM | POA: Insufficient documentation

## 2022-12-20 DIAGNOSIS — E871 Hypo-osmolality and hyponatremia: Secondary | ICD-10-CM | POA: Insufficient documentation

## 2022-12-20 DIAGNOSIS — I890 Lymphedema, not elsewhere classified: Secondary | ICD-10-CM | POA: Diagnosis present

## 2022-12-20 DIAGNOSIS — Z955 Presence of coronary angioplasty implant and graft: Secondary | ICD-10-CM

## 2022-12-20 DIAGNOSIS — G4733 Obstructive sleep apnea (adult) (pediatric): Secondary | ICD-10-CM | POA: Diagnosis present

## 2022-12-20 DIAGNOSIS — Z87891 Personal history of nicotine dependence: Secondary | ICD-10-CM | POA: Diagnosis not present

## 2022-12-20 DIAGNOSIS — I2510 Atherosclerotic heart disease of native coronary artery without angina pectoris: Secondary | ICD-10-CM | POA: Diagnosis present

## 2022-12-20 DIAGNOSIS — Z538 Procedure and treatment not carried out for other reasons: Secondary | ICD-10-CM | POA: Diagnosis not present

## 2022-12-20 DIAGNOSIS — I5031 Acute diastolic (congestive) heart failure: Secondary | ICD-10-CM | POA: Diagnosis not present

## 2022-12-20 DIAGNOSIS — I130 Hypertensive heart and chronic kidney disease with heart failure and stage 1 through stage 4 chronic kidney disease, or unspecified chronic kidney disease: Principal | ICD-10-CM | POA: Diagnosis present

## 2022-12-20 DIAGNOSIS — I083 Combined rheumatic disorders of mitral, aortic and tricuspid valves: Secondary | ICD-10-CM | POA: Diagnosis present

## 2022-12-20 DIAGNOSIS — Z794 Long term (current) use of insulin: Secondary | ICD-10-CM

## 2022-12-20 DIAGNOSIS — J9601 Acute respiratory failure with hypoxia: Secondary | ICD-10-CM | POA: Diagnosis present

## 2022-12-20 DIAGNOSIS — Z7902 Long term (current) use of antithrombotics/antiplatelets: Secondary | ICD-10-CM

## 2022-12-20 DIAGNOSIS — I480 Paroxysmal atrial fibrillation: Secondary | ICD-10-CM | POA: Diagnosis present

## 2022-12-20 DIAGNOSIS — Z539 Procedure and treatment not carried out, unspecified reason: Secondary | ICD-10-CM | POA: Insufficient documentation

## 2022-12-20 DIAGNOSIS — Z7984 Long term (current) use of oral hypoglycemic drugs: Secondary | ICD-10-CM | POA: Diagnosis not present

## 2022-12-20 DIAGNOSIS — Z1152 Encounter for screening for COVID-19: Secondary | ICD-10-CM

## 2022-12-20 DIAGNOSIS — E559 Vitamin D deficiency, unspecified: Secondary | ICD-10-CM | POA: Insufficient documentation

## 2022-12-20 DIAGNOSIS — I5033 Acute on chronic diastolic (congestive) heart failure: Secondary | ICD-10-CM | POA: Diagnosis present

## 2022-12-20 DIAGNOSIS — K219 Gastro-esophageal reflux disease without esophagitis: Secondary | ICD-10-CM | POA: Diagnosis present

## 2022-12-20 DIAGNOSIS — Z79899 Other long term (current) drug therapy: Secondary | ICD-10-CM

## 2022-12-20 DIAGNOSIS — E785 Hyperlipidemia, unspecified: Secondary | ICD-10-CM | POA: Diagnosis present

## 2022-12-20 DIAGNOSIS — N179 Acute kidney failure, unspecified: Secondary | ICD-10-CM | POA: Insufficient documentation

## 2022-12-20 DIAGNOSIS — I10 Essential (primary) hypertension: Secondary | ICD-10-CM

## 2022-12-20 DIAGNOSIS — E119 Type 2 diabetes mellitus without complications: Secondary | ICD-10-CM

## 2022-12-20 DIAGNOSIS — E039 Hypothyroidism, unspecified: Secondary | ICD-10-CM | POA: Diagnosis present

## 2022-12-20 DIAGNOSIS — I340 Nonrheumatic mitral (valve) insufficiency: Secondary | ICD-10-CM | POA: Insufficient documentation

## 2022-12-20 DIAGNOSIS — Z951 Presence of aortocoronary bypass graft: Secondary | ICD-10-CM | POA: Diagnosis not present

## 2022-12-20 DIAGNOSIS — I878 Other specified disorders of veins: Secondary | ICD-10-CM | POA: Diagnosis present

## 2022-12-20 DIAGNOSIS — E1165 Type 2 diabetes mellitus with hyperglycemia: Secondary | ICD-10-CM | POA: Diagnosis present

## 2022-12-20 DIAGNOSIS — E1122 Type 2 diabetes mellitus with diabetic chronic kidney disease: Secondary | ICD-10-CM | POA: Diagnosis present

## 2022-12-20 DIAGNOSIS — I509 Heart failure, unspecified: Secondary | ICD-10-CM

## 2022-12-20 DIAGNOSIS — Z7982 Long term (current) use of aspirin: Secondary | ICD-10-CM

## 2022-12-20 DIAGNOSIS — I25118 Atherosclerotic heart disease of native coronary artery with other forms of angina pectoris: Secondary | ICD-10-CM | POA: Diagnosis not present

## 2022-12-20 DIAGNOSIS — I272 Pulmonary hypertension, unspecified: Secondary | ICD-10-CM | POA: Diagnosis present

## 2022-12-20 DIAGNOSIS — Z888 Allergy status to other drugs, medicaments and biological substances status: Secondary | ICD-10-CM

## 2022-12-20 LAB — CBC WITH DIFFERENTIAL/PLATELET
Abs Immature Granulocytes: 0.02 10*3/uL (ref 0.00–0.07)
Basophils Absolute: 0.1 10*3/uL (ref 0.0–0.1)
Basophils Relative: 1 %
Eosinophils Absolute: 0.1 10*3/uL (ref 0.0–0.5)
Eosinophils Relative: 1 %
HCT: 35.7 % — ABNORMAL LOW (ref 39.0–52.0)
Hemoglobin: 11 g/dL — ABNORMAL LOW (ref 13.0–17.0)
Immature Granulocytes: 0 %
Lymphocytes Relative: 8 %
Lymphs Abs: 0.5 10*3/uL — ABNORMAL LOW (ref 0.7–4.0)
MCH: 29.3 pg (ref 26.0–34.0)
MCHC: 30.8 g/dL (ref 30.0–36.0)
MCV: 94.9 fL (ref 80.0–100.0)
Monocytes Absolute: 0.5 10*3/uL (ref 0.1–1.0)
Monocytes Relative: 7 %
Neutro Abs: 5.3 10*3/uL (ref 1.7–7.7)
Neutrophils Relative %: 83 %
Platelets: 141 10*3/uL — ABNORMAL LOW (ref 150–400)
RBC: 3.76 MIL/uL — ABNORMAL LOW (ref 4.22–5.81)
RDW: 17.6 % — ABNORMAL HIGH (ref 11.5–15.5)
WBC: 6.5 10*3/uL (ref 4.0–10.5)
nRBC: 0 % (ref 0.0–0.2)

## 2022-12-20 LAB — URINALYSIS, ROUTINE W REFLEX MICROSCOPIC
Bacteria, UA: NONE SEEN
Bilirubin Urine: NEGATIVE
Glucose, UA: NEGATIVE mg/dL
Hgb urine dipstick: NEGATIVE
Ketones, ur: NEGATIVE mg/dL
Leukocytes,Ua: NEGATIVE
Nitrite: NEGATIVE
Protein, ur: >=300 mg/dL — AB
Specific Gravity, Urine: 1.013 (ref 1.005–1.030)
Squamous Epithelial / HPF: NONE SEEN /HPF (ref 0–5)
pH: 7 (ref 5.0–8.0)

## 2022-12-20 LAB — COMPREHENSIVE METABOLIC PANEL
ALT: 21 U/L (ref 0–44)
AST: 22 U/L (ref 15–41)
Albumin: 3.9 g/dL (ref 3.5–5.0)
Alkaline Phosphatase: 141 U/L — ABNORMAL HIGH (ref 38–126)
Anion gap: 10 (ref 5–15)
BUN: 25 mg/dL — ABNORMAL HIGH (ref 8–23)
CO2: 26 mmol/L (ref 22–32)
Calcium: 10.3 mg/dL (ref 8.9–10.3)
Chloride: 105 mmol/L (ref 98–111)
Creatinine, Ser: 1.11 mg/dL (ref 0.61–1.24)
GFR, Estimated: >60 mL/min (ref >60)
Glucose, Bld: 132 mg/dL — ABNORMAL HIGH (ref 70–99)
Potassium: 3.7 mmol/L (ref 3.5–5.1)
Sodium: 141 mmol/L (ref 135–145)
Total Bilirubin: 1.5 mg/dL — ABNORMAL HIGH (ref 0.3–1.2)
Total Protein: 7.8 g/dL (ref 6.5–8.1)

## 2022-12-20 LAB — PROTIME-INR
INR: 1.3 — ABNORMAL HIGH (ref 0.8–1.2)
Prothrombin Time: 16 seconds — ABNORMAL HIGH (ref 11.4–15.2)

## 2022-12-20 LAB — BRAIN NATRIURETIC PEPTIDE: B Natriuretic Peptide: 1180.2 pg/mL — ABNORMAL HIGH (ref 0.0–100.0)

## 2022-12-20 LAB — GLUCOSE, CAPILLARY
Glucose-Capillary: 123 mg/dL — ABNORMAL HIGH (ref 70–99)
Glucose-Capillary: 133 mg/dL — ABNORMAL HIGH (ref 70–99)

## 2022-12-20 LAB — APTT: aPTT: 35 seconds (ref 24–36)

## 2022-12-20 LAB — TROPONIN I (HIGH SENSITIVITY)
Troponin I (High Sensitivity): 12 ng/L (ref <18)
Troponin I (High Sensitivity): 14 ng/L (ref <18)

## 2022-12-20 LAB — SARS CORONAVIRUS 2 BY RT PCR: SARS Coronavirus 2 by RT PCR: NEGATIVE

## 2022-12-20 SURGERY — INVASIVE LAB ABORTED CASE
Anesthesia: Moderate Sedation | Laterality: Left

## 2022-12-20 MED ORDER — HEPARIN (PORCINE) 25000 UT/250ML-% IV SOLN
2300.0000 [IU]/h | INTRAVENOUS | Status: DC
  Administered 2022-12-20: 1500 [IU]/h via INTRAVENOUS
  Administered 2022-12-21: 2100 [IU]/h via INTRAVENOUS
  Administered 2022-12-21: 1900 [IU]/h via INTRAVENOUS
  Administered 2022-12-22 (×3): 2300 [IU]/h via INTRAVENOUS
  Filled 2022-12-20 (×6): qty 250

## 2022-12-20 MED ORDER — METOPROLOL TARTRATE 25 MG PO TABS
25.0000 mg | ORAL_TABLET | Freq: Two times a day (BID) | ORAL | Status: DC
  Administered 2022-12-20 – 2022-12-27 (×14): 25 mg via ORAL
  Filled 2022-12-20 (×14): qty 1

## 2022-12-20 MED ORDER — HEPARIN BOLUS VIA INFUSION
4000.0000 [IU] | Freq: Once | INTRAVENOUS | Status: AC
  Administered 2022-12-20: 4000 [IU] via INTRAVENOUS
  Filled 2022-12-20: qty 4000

## 2022-12-20 MED ORDER — HYDRALAZINE HCL 50 MG PO TABS
50.0000 mg | ORAL_TABLET | Freq: Three times a day (TID) | ORAL | Status: DC
  Administered 2022-12-20 – 2022-12-24 (×12): 50 mg via ORAL
  Filled 2022-12-20 (×12): qty 1

## 2022-12-20 MED ORDER — SODIUM CHLORIDE 0.9% FLUSH: 3.0000 mL | Freq: Two times a day (BID) | INTRAVENOUS | Status: DC

## 2022-12-20 MED ORDER — FUROSEMIDE 10 MG/ML IJ SOLN
10.0000 mg/h | INTRAVENOUS | Status: DC
  Administered 2022-12-20: 8 mg/h via INTRAVENOUS
  Administered 2022-12-21 – 2022-12-26 (×6): 10 mg/h via INTRAVENOUS
  Filled 2022-12-20 (×7): qty 20

## 2022-12-20 MED ORDER — PANTOPRAZOLE SODIUM 40 MG PO TBEC
40.0000 mg | DELAYED_RELEASE_TABLET | Freq: Every day | ORAL | Status: DC
  Administered 2022-12-20 – 2022-12-27 (×8): 40 mg via ORAL
  Filled 2022-12-20 (×8): qty 1

## 2022-12-20 MED ORDER — SODIUM CHLORIDE 0.9% FLUSH: 3.0000 mL | INTRAVENOUS | Status: DC | PRN

## 2022-12-20 MED ORDER — ACETAMINOPHEN 325 MG PO TABS: 650.0000 mg | ORAL_TABLET | ORAL | Status: DC | PRN

## 2022-12-20 MED ORDER — CLOPIDOGREL BISULFATE 75 MG PO TABS
75.0000 mg | ORAL_TABLET | Freq: Every day | ORAL | Status: DC
  Administered 2022-12-20 – 2022-12-27 (×8): 75 mg via ORAL
  Filled 2022-12-20 (×8): qty 1

## 2022-12-20 MED ORDER — NITROGLYCERIN 2 % TD OINT
1.0000 [in_us] | TOPICAL_OINTMENT | Freq: Four times a day (QID) | TRANSDERMAL | Status: DC
  Administered 2022-12-20 – 2022-12-25 (×19): 1 [in_us] via TOPICAL
  Filled 2022-12-20 (×19): qty 1

## 2022-12-20 MED ORDER — SODIUM CHLORIDE 0.9% FLUSH
3.0000 mL | Freq: Two times a day (BID) | INTRAVENOUS | Status: DC
  Administered 2022-12-20 – 2022-12-23 (×4): 3 mL via INTRAVENOUS

## 2022-12-20 MED ORDER — ASPIRIN 81 MG PO CHEW: 81.0000 mg | CHEWABLE_TABLET | Freq: Every day | ORAL | Status: DC

## 2022-12-20 MED ORDER — SODIUM CHLORIDE 0.9 % WEIGHT BASED INFUSION: 1.0000 mL/kg/h | INTRAVENOUS | Status: DC

## 2022-12-20 MED ORDER — INSULIN GLARGINE-YFGN 100 UNIT/ML ~~LOC~~ SOLN
12.0000 [IU] | Freq: Every day | SUBCUTANEOUS | Status: DC
  Administered 2022-12-20 – 2022-12-21 (×2): 12 [IU] via SUBCUTANEOUS
  Filled 2022-12-20 (×3): qty 0.12

## 2022-12-20 MED ORDER — ASPIRIN 81 MG PO CHEW: 81.0000 mg | CHEWABLE_TABLET | ORAL | Status: DC

## 2022-12-20 MED ORDER — SODIUM CHLORIDE 0.9 % IV SOLN
250.0000 mL | INTRAVENOUS | Status: DC | PRN
  Administered 2022-12-20: 250 mL via INTRAVENOUS

## 2022-12-20 MED ORDER — FUROSEMIDE 10 MG/ML IJ SOLN: 40.0000 mg | Freq: Two times a day (BID) | INTRAMUSCULAR | Status: DC

## 2022-12-20 MED ORDER — POTASSIUM CHLORIDE CRYS ER 20 MEQ PO TBCR
40.0000 meq | EXTENDED_RELEASE_TABLET | Freq: Once | ORAL | Status: AC
  Administered 2022-12-20: 40 meq via ORAL
  Filled 2022-12-20: qty 2

## 2022-12-20 MED ORDER — INSULIN ASPART 100 UNIT/ML IJ SOLN
0.0000 [IU] | Freq: Three times a day (TID) | INTRAMUSCULAR | Status: DC
  Administered 2022-12-22: 5 [IU] via SUBCUTANEOUS
  Administered 2022-12-23: 2 [IU] via SUBCUTANEOUS
  Administered 2022-12-23: 3 [IU] via SUBCUTANEOUS
  Administered 2022-12-24 (×2): 2 [IU] via SUBCUTANEOUS
  Administered 2022-12-24 – 2022-12-25 (×2): 5 [IU] via SUBCUTANEOUS
  Administered 2022-12-25: 3 [IU] via SUBCUTANEOUS
  Administered 2022-12-25: 2 [IU] via SUBCUTANEOUS
  Administered 2022-12-26 (×2): 5 [IU] via SUBCUTANEOUS
  Administered 2022-12-26: 3 [IU] via SUBCUTANEOUS
  Administered 2022-12-27: 2 [IU] via SUBCUTANEOUS
  Filled 2022-12-20 (×13): qty 1

## 2022-12-20 MED ORDER — ROSUVASTATIN CALCIUM 10 MG PO TABS
40.0000 mg | ORAL_TABLET | Freq: Every day | ORAL | Status: DC
  Administered 2022-12-20 – 2022-12-27 (×8): 40 mg via ORAL
  Filled 2022-12-20 (×7): qty 4
  Filled 2022-12-20: qty 2

## 2022-12-20 MED ORDER — SODIUM CHLORIDE 0.9 % IV SOLN: 250.0000 mL | INTRAVENOUS | Status: DC | PRN

## 2022-12-20 MED ORDER — SODIUM CHLORIDE 0.9 % WEIGHT BASED INFUSION: 3.0000 mL/kg/h | INTRAVENOUS | Status: AC

## 2022-12-20 MED ORDER — FUROSEMIDE 10 MG/ML IJ SOLN
40.0000 mg | Freq: Once | INTRAMUSCULAR | Status: AC
  Administered 2022-12-20: 40 mg via INTRAVENOUS
  Filled 2022-12-20: qty 4

## 2022-12-20 MED ORDER — ONDANSETRON HCL 4 MG/2ML IJ SOLN: 4.0000 mg | Freq: Four times a day (QID) | INTRAMUSCULAR | Status: DC | PRN

## 2022-12-20 NOTE — Assessment & Plan Note (Signed)
-   CPAP at bedtime 

## 2022-12-20 NOTE — Assessment & Plan Note (Signed)
-   Hold home antiglycemic agents - A1c pending - SSI, moderate - Semglee 12 units at bedtime

## 2022-12-20 NOTE — Assessment & Plan Note (Signed)
Since admission, patient has been started on Lasix infusion, metoprolol and nitroglycerin.  At home, he was previously on amlodipine, hydralazine, HCTZ, irbesartan, labetalol.  He remains hypertensive above goal at this time.   -Will discuss with cardiology regarding restarting home antihypertensives, as he would likely benefit from at least 1 or 2 agents being restarted today.

## 2022-12-20 NOTE — Consult Note (Signed)
[facility] CLINIC CARDIOLOGY CONSULT NOTE       Patient [name] MRN: [id number] DOB/AGE: [birth year] 75 y.o.  Admit date: 12/20/2022 Referring Physician Dr. Artis Delay Primary Physician  Primary Cardiologist Dr. Juliann Pares Reason for Consultation AoCHF  HPI: [name]. Calverley is a 89yoM with a PMH of CAD s/p CABG (LIMA-LAD, SVG - LCX 100% occluded, SVG - PDA s/p PCI (2021) with prior stents, paroxysmal AF (not anticoagulated), HFpEF (severe LVH, mild AS, moderate AI, moderate to severe MR, moderate TR by echo 09/2022) DM2, CKD, OSA, who presented to [facility] for an outpatient left heart cath with Dr. Juliann Pares the morning of 5/9.  He was orthopneic with significant peripheral edema and was ultimately unable to lay flat for the procedure. He was referred to [facility] ED for further evaluation and management of acute on chronic heart failure.  The patient presents with his son.  For the past several months he has been having worsening dyspnea on exertion and increased frequency of chest pain/pressure with exertion.  He has known significant underlying CAD status post CABG and multiple subsequent PCI's to his native vessels and most recently his SVG to PDA graft in 2021 with Dr. Juliann Pares.  He has had at least a 10 pound weight gain with severe tense lower extremity edema with weeping from his legs that is worsened over the past couple weeks.  He was prescribed torsemide by Dr. Juliann Pares on 4/22 to help with his peripheral edema and shortness of breath, but his symptoms have worsened despite this.  He presented to [facility] initially as an outpatient left heart catheterization for further evaluation of his progressive chest pain shortness of breath, however when he arrived to specials in preparation for the procedure, he was significantly dyspneic and was unable to lie flat.  He was referred immediately to the emergency department for further evaluation.  At my time of evaluation this afternoon he is sitting upright  in the ED stretcher with his son present, notes he feels "so-so."  He had just received a dose of IV Lasix and has been urinating well from this.  He has not noted too much improvement in his peripheral edema or dyspnea since his admission.  He denies current chest pain.  Vitals are notable for elevated blood pressure of 189/99, he is somewhat short of breath appearing on 3 L of oxygen by nasal cannula, heart rate in the 70s to 80s in atrial fibrillation on telemetry.  Labs are notable for a potassium 3.7, BUN/creatinine 25/1.11 and GFR >60.  BNP markedly elevated at 1180.  High-sensitivity troponin negative at 12, 14.  H&H 11/35.7, platelets 114.  Chest x-ray with cardiomegaly, vascular congestion, and small pleural effusions.  Review of systems complete and found to be negative unless listed above     Past Medical History:  Diagnosis Date   Anemia    Arthritis    Atrial fibrillation (HCC)    AV block, 1st degree    Bundle branch block    Chronic kidney disease    patient not aware of this dx   Coronary artery disease    Diabetes mellitus without complication (HCC)    type 2   Dyspnea    GERD (gastroesophageal reflux disease)    History of blood transfusion    Hypertension    Sleep apnea    does not use cpap    Past Surgical History:  Procedure Laterality Date   APPENDECTOMY     CARDIAC CATHETERIZATION Left 08/16/2016  Procedure: Left Heart Cath and Coronary Angiography;  Surgeon: Alwyn Pea, MD;  Location: [facility] INVASIVE CV LAB;  Service: Cardiovascular;  Laterality: Left;   COLON SURGERY     part of colon removed   COLONOSCOPY N/A 09/07/2020   Procedure: COLONOSCOPY;  Surgeon: Pasty Spillers, MD;  Location: [facility] ENDOSCOPY;  Service: Endoscopy;  Laterality: N/A;   COLONOSCOPY WITH PROPOFOL N/A 05/17/2021   Procedure: COLONOSCOPY WITH PROPOFOL;  Surgeon: Pasty Spillers, MD;  Location: [facility] ENDOSCOPY;  Service: Endoscopy;  Laterality: N/A;   CORONARY ARTERY  BYPASS GRAFT  08/30/2016   triple   CORONARY STENT INTERVENTION N/A 06/09/2020   Procedure: CORONARY STENT INTERVENTION;  Surgeon: Alwyn Pea, MD;  Location: [facility] INVASIVE CV LAB;  Service: Cardiovascular;  Laterality: N/A;   ENDOSCOPIC MUCOSAL RESECTION N/A 09/22/2020   Procedure: ENDOSCOPIC MUCOSAL RESECTION;  Surgeon: Meridee Score Netty Starring., MD;  Location: [facility] ENDOSCOPY;  Service: Gastroenterology;  Laterality: N/A;   ESOPHAGOGASTRODUODENOSCOPY N/A 09/07/2020   Procedure: ESOPHAGOGASTRODUODENOSCOPY (EGD);  Surgeon: Pasty Spillers, MD;  Location: [facility] ENDOSCOPY;  Service: Endoscopy;  Laterality: N/A;   ESOPHAGOGASTRODUODENOSCOPY (EGD) WITH PROPOFOL N/A 09/22/2020   Procedure: ESOPHAGOGASTRODUODENOSCOPY (EGD) WITH PROPOFOL;  Surgeon: Meridee Score Netty Starring., MD;  Location: [facility] ENDOSCOPY;  Service: Gastroenterology;  Laterality: N/A;   FRACTURE SURGERY     HEMOSTASIS CLIP PLACEMENT  09/22/2020   Procedure: HEMOSTASIS CLIP PLACEMENT;  Surgeon: Lemar Lofty., MD;  Location: [facility] ENDOSCOPY;  Service: Gastroenterology;;   HEMOSTASIS CONTROL  09/22/2020   Procedure: HEMOSTASIS CONTROL;  Surgeon: Lemar Lofty., MD;  Location: [facility] ENDOSCOPY;  Service: Gastroenterology;;   LEFT HEART CATH AND CORONARY ANGIOGRAPHY Left 06/09/2020   Procedure: LEFT HEART CATH AND CORONARY ANGIOGRAPHY;  Surgeon: Alwyn Pea, MD;  Location: [facility] INVASIVE CV LAB;  Service: Cardiovascular;  Laterality: Left;   LEFT HEART CATH AND CORS/GRAFTS ANGIOGRAPHY Left 11/20/2016   Procedure: Left Heart Cath and Cors/Grafts Angiography;  Surgeon: Alwyn Pea, MD;  Location: [facility] INVASIVE CV LAB;  Service: Cardiovascular;  Laterality: Left;   POLYPECTOMY  09/22/2020   Procedure: POLYPECTOMY;  Surgeon: Mansouraty, Netty Starring., MD;  Location: [facility] ENDOSCOPY;  Service: Gastroenterology;;   SUBMUCOSAL LIFTING INJECTION  09/22/2020   Procedure: SUBMUCOSAL LIFTING INJECTION;  Surgeon: Lemar Lofty., MD;  Location: [facility] ENDOSCOPY;  Service: Gastroenterology;;    (Not in a hospital admission)  Social History   Socioeconomic History   Marital status: Married    Spouse name: Not on file   Number of children: Not on file   Years of education: Not on file   Highest education level: Not on file  Occupational History   Not on file  Tobacco Use   Smoking status: Former    Types: Cigarettes   Smokeless tobacco: Never  Vaping Use   Vaping Use: Never used  Substance and Sexual Activity   Alcohol use: No   Drug use: No   Sexual activity: Not Currently  Other Topics Concern   Not on file  Social History Narrative   Not on file   Social Determinants of Health   Financial Resource Strain: Not on file  Food Insecurity: Not on file  Transportation Needs: Not on file  Physical Activity: Not on file  Stress: Not on file  Social Connections: Not on file  Intimate Partner Violence: Not on file    History reviewed. No pertinent family history.   No intake or output data in the 24 hours ending 12/20/22 1641  Vitals:  12/20/22 1250 12/20/22 1300 12/20/22 1430 12/20/22 1500  BP: (!) 178/88 (!) 190/75 (!) 183/81 (!) 201/91  Pulse: 63 68 (!) 59 (!) 118  Resp: 20 (!) 26 (!) 24 (!) 23  Temp: 98.2 F (36.8 C)     TempSrc: Oral     SpO2: 93% 94% 95% 93%  Weight:      Height:        PHYSICAL EXAM General: Pleasant ill-appearing, Caucasian male, sitting upright with legs off ED stretcher, son at bedside.   HEENT:  Normocephalic and atraumatic. Neck:  + JVD.  Lungs: Somewhat short of breath appearing on oxygen by nasal cannula.  Crackles bilaterally to auscultation with trace expiratory wheezing in upper lung fields. Heart: Irregularly irregular with controlled rate.  3/6 systolic murmur heard throughout with S3 present. Abdomen: distended appearing.  Msk: Normal strength and tone for age. Extremities: Tense woody lower extremity edema bilaterally from toes up to thighs  with weeping from wound on his right lower leg.  Generalized erythema to both lower legs. Neuro: Alert and oriented X 3. Psych:  Answers questions appropriately.   Labs: Basic Metabolic Panel: Recent Labs    12/20/22 1258  NA 141  K 3.7  CL 105  CO2 26  GLUCOSE 132*  BUN 25*  CREATININE 1.11  CALCIUM 10.3   Liver Function Tests: Recent Labs    12/20/22 1258  AST 22  ALT 21  ALKPHOS 141*  BILITOT 1.5*  PROT 7.8  ALBUMIN 3.9   No results for input(s): "LIPASE", "AMYLASE" in the last 72 hours. CBC: Recent Labs    12/20/22 1258  WBC 6.5  NEUTROABS 5.3  HGB 11.0*  HCT 35.7*  MCV 94.9  PLT 141*   Cardiac Enzymes: Recent Labs    12/20/22 1258 12/20/22 1443  TROPONINIHS 12 14   BNP: Recent Labs    12/20/22 1258  BNP 1,180.2*   D-Dimer: No results for input(s): "DDIMER" in the last 72 hours. Hemoglobin A1C: No results for input(s): "HGBA1C" in the last 72 hours. Fasting Lipid Panel: No results for input(s): "CHOL", "HDL", "LDLCALC", "TRIG", "CHOLHDL", "LDLDIRECT" in the last 72 hours. Thyroid Function Tests: No results for input(s): "TSH", "T4TOTAL", "T3FREE", "THYROIDAB" in the last 72 hours.  Invalid input(s): "FREET3" Anemia Panel: No results for input(s): "VITAMINB12", "FOLATE", "FERRITIN", "TIBC", "IRON", "RETICCTPCT" in the last 72 hours.   Radiology: US Venous Img Lower Bilateral  Result Date: 12/20/2022 CLINICAL DATA:  Bilateral lower extremity swelling and pain EXAM: BILATERAL LOWER EXTREMITY VENOUS DOPPLER ULTRASOUND TECHNIQUE: Gray-scale sonography with graded compression, as well as color Doppler and duplex ultrasound were performed to evaluate the lower extremity deep venous systems from the level of the common femoral vein and including the common femoral, femoral, profunda femoral, popliteal and calf veins including the posterior tibial, peroneal and gastrocnemius veins when visible. Spectral Doppler was utilized to evaluate flow at rest and  with distal augmentation maneuvers in the common femoral, femoral and popliteal veins. COMPARISON:  None Available. FINDINGS: RIGHT LOWER EXTREMITY Common Femoral Vein: No evidence of thrombus. Normal compressibility, respiratory phasicity and response to augmentation. Saphenofemoral Junction: No evidence of thrombus. Normal compressibility and flow on color Doppler imaging. Profunda Femoral Vein: No evidence of thrombus. Normal compressibility and flow on color Doppler imaging. Femoral Vein: No evidence of thrombus. Normal compressibility, respiratory phasicity and response to augmentation. Popliteal Vein: No evidence of thrombus. Normal compressibility, respiratory phasicity and response to augmentation. Calf Veins: No evidence of thrombus. Normal compressibility and  flow on color Doppler imaging. LEFT LOWER EXTREMITY Common Femoral Vein: No evidence of thrombus. Normal compressibility, respiratory phasicity and response to augmentation. Saphenofemoral Junction: No evidence of thrombus. Normal compressibility and flow on color Doppler imaging. Profunda Femoral Vein: No evidence of thrombus. Normal compressibility and flow on color Doppler imaging. Femoral Vein: No evidence of thrombus. Normal compressibility, respiratory phasicity and response to augmentation. Popliteal Vein: No evidence of thrombus. Normal compressibility, respiratory phasicity and response to augmentation. Calf Veins: No evidence of thrombus. Normal compressibility and flow on color Doppler imaging. Other Findings: Benign-appearing fatty replaced elongated lymph nodes noted bilaterally. Extremity subcutaneous edema present bilaterally. IMPRESSION: No evidence of deep venous thrombosis in either lower extremity. Electronically Signed   By: Judie Petit.  Shick M.D.   On: 12/20/2022 14:38   DG Chest Portable 1 View  Result Date: 12/20/2022 CLINICAL DATA:  Shortness of breath EXAM: PORTABLE CHEST 1 VIEW COMPARISON:  X-ray 08/21/2020 FINDINGS:  Underinflation with bilateral small pleural effusions, new from previous. Adjacent opacity. Vascular congestion. No pneumothorax. Status post median sternotomy. The heart is enlarged. Overlapping cardiac leads. IMPRESSION: Postop chest. Enlarged cardiopericardial silhouette with vascular congestion and small effusions. Adjacent lung opacities. Recommend follow-up Electronically Signed   By: Karen Kays M.D.   On: 12/20/2022 13:37    LHC 2021  LM lesion is 40% stenosed. Prox RCA lesion is 99% stenosed. Mid RCA lesion is 75% stenosed. Dist RCA lesion is 95% stenosed. RPDA lesion is 90% stenosed. SVG and is normal in caliber. The flow is not reversed. There is no competitive flow SVG and is normal in caliber. The flow is not reversed. There is no competitive flow Prox Graft lesion is 100% stenosed. LIMA and is normal in caliber. The flow is not reversed. There is no competitive flow Origin lesion is 95% stenosed. Previously placed Prox Cx to Dist Cx stent (unknown type) is widely patent. Dist Cx lesion is 90% stenosed. Previously placed Ost LAD to Mid LAD stent (unknown type) is widely patent. Origin to Prox Graft lesion is 100% stenosed. A drug-eluting stent was successfully placed using a STENT RESOLUTE ONYX 2.5X18. Post intervention, there is a 0% residual stenosis. Ost Cx lesion is 70% stenosed. Dist LAD-1 lesion is 50% stenosed. Previously placed Dist LAD-2 stent (unknown type) is widely patent. Ost LAD lesion is 75% stenosed. Preserved left ventricular function ejection fraction of 60%   Conclusion Normal left ventricular function 60% Coronaries Left main large calcified moderate lesion moderate to large calcium Distal LAD large ostial lesion 75% moderate calcification proximal LAD stent widely patent mid LAD stent widely patent distal LAD diffuse 50% Circumflex is large 70% ostial mid stent widely patent distal circumflex 95% TIMI-3 flow RCA medium proximal 90% lesion 90%  mid distal 75 Grafts LIMA to LAD incompletely evaluated probably occluded SVG to circumflex 100% occluded SVG to PDA 95% ostial in-stent TIMI-3 flow Intervention Successful PCI and stent to in-stent restenosis ostially with 2.5 x 18 mm resolute Onyx to 20 atm Lesion reduced from 95 down to 0% TIMI-3 flow maintained throughout   Successful PCI and stent ostial SVG in-stent restenosis. Lesion reduced from 95 down to 0%  ECHO  NORMAL LEFT VENTRICULAR SYSTOLIC FUNCTION   WITH SEVERE LVH  NORMAL RIGHT VENTRICULAR SYSTOLIC FUNCTION  MODERATE VALVULAR REGURGITATION (See above)  MILD VALVULAR STENOSIS (See above)  IRREGULAR HEART RHYTHM CAPTURED THROUGHOUT EXAM  ESTIMATED LVEF >55%  MODERATE AI  MILD AS; Max Velocity: 3.33m/s; AVA: 1.1cm^2  (PREV. ECHO: 2.22m/s; AVA: 1.9cm^2)  MODERATE - SEVERE MR (MVP: MILD - AMVL)  MODERATE TR; SEVERE PHTN  SEVERE LAE; MILD RAE  MILDLY DILATED ASCENDING AORTA MEASURING UP TO 3.7cm  PATIENT IMAGED SUPINE FROM APICALS TO PEDOFF TO SUBCOSTAL; PATIENT HAD DIFFICULTY LAYING  DOWN; FURTHER EVALUATION DID NOT SHOW ANY SIGNS OF TAMPONADE  _________________________________________________________________________________________  Electronically signed by    Dorothyann Peng, MD on 09/28/2022 07: 54 AM           Performed By: Verdis Prime     Ordering Physician: Dorothyann Peng, MD   TELEMETRY reviewed by me (LT) 12/20/2022 : AF rate 70s-80s  EKG reviewed by me: AF RBBB and LAFB rate 70  Data reviewed by me (LT) 12/20/2022: Last outpatient cardiology note, ED note, nursing notes last 24h vitals tele labs imaging I/O    Principal Problem:   Acute heart failure with preserved ejection fraction (HFpEF) (HCC)    ASSESSMENT AND PLAN:  Normand Sloop P. Reinsmith is a 89yoM with a PMH of CAD s/p CABG (LIMA-LAD, SVG - LCX 100% occluded, SVG - PDA s/p PCI (2021) with prior stents, paroxysmal AF (not anticoagulated), HFpEF (severe LVH, mild AS, moderate AI, moderate to severe MR,  moderate TR by echo 09/2022) DM2, CKD, OSA, who presented to [facility] for an outpatient left heart cath with Dr. Juliann Pares the morning of 5/9.  He was orthopneic with significant peripheral edema and was ultimately unable to lay flat for the procedure. He was referred to [facility] ED for further evaluation and management of acute on chronic heart failure.  # Acute on chronic HFpEF # Moderate to severe MR # Moderate AI Presents with 2 months of progressively worsening shortness of breath and exertional chest pain for which LHC was initially recommended by Dr. Juliann Pares.  Over the past 2 weeks his peripheral edema, orthopnea, and dyspnea have significantly worsened, refractory to outpatient torsemide.  He is clinically massively volume overloaded on exam with tense woody edema with weeping from wounds on his legs. -S/p initial IV Lasix, will start a Lasix infusion at 8 mg/h -Start Nitropaste 1 inch every 6 hours to assist with vasodilation and BP control -Start metoprolol tartrate 25 mg twice daily -Further additions of ARB, MRA, SGLT2i pending initial diuresis and stability of his renal function. -Echo complete pending  # CAD s/p CABG and multiple prior PCI's Chest pain-free at rest, EKGs nonacute, troponins negative.  Plan to continue aspirin and statin.  Risk factor modification with lipid panel and A1c in the morning.  No immediate plan for invasive cardiac diagnostics, continue management of his heart failure as above  # Paroxysmal atrial fibrillation Rate controlled on telemetry, the patient does not seem to understand a lot about his atrial fibrillation and was not on anticoagulation prior to this admission.  Will start metoprolol for rate control and heparin for anticoagulation.  Will discuss the risks and benefits of chronic anticoagulation more during this hospitalization and likely recommend DOAC at discharge if the patient is agreeable.  CHA2DS2-VASc 6 (age, CHF, HTN, CAD, DM2)   This patient's plan  of care was discussed and created with Dr. Darrold Junker and he is in agreement.  Signed: Rebeca Allegra , PA-C 12/20/2022, 4:41 PM [facility]

## 2022-12-20 NOTE — Assessment & Plan Note (Signed)
Patient is presenting with DOE, orthopnea, and significant peripheral edema with BNP of 1100 consistent with acute heart failure exacerbation.  He had initially come to the hospital for a left heart cath but was unable to lay flat.  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  Cardiology has been consulted and initiated Lasix drip.  - Cardiology consulted; appreciate their recommendations - Continue Lasix drip per cardiology - Echocardiogram ordered. - Strict in and out - Daily weights - Nitroglycerin paste every 6 hours

## 2022-12-20 NOTE — ED Notes (Signed)
Pt sitting at the side of the bed, urinal at bedside, pt given cranberry juice, denies other needs

## 2022-12-20 NOTE — Assessment & Plan Note (Signed)
Bilateral woody appearance of lower extremities consistent with chronic venous stasis.  Patient states that lower extremity edema below the knees is unchanged compared to prior and has been this way for years, however I do suspect that they are slightly worsened given significant hypervolemia    - Unna Boots ordered to help with wound healing and compression

## 2022-12-20 NOTE — ED Triage Notes (Signed)
Patient to ED from cath lab; patient had scheduled left heart cath today but was unable to tolerate lying flat due to increased shortness of breath for the past 2 weeks. Patient on room air at baseline but now requiring 2L [place]. Patient also complaining of bilateral leg swelling.

## 2022-12-20 NOTE — Consult Note (Signed)
ANTICOAGULATION CONSULT NOTE  Pharmacy Consult for heparin infusion Indication: atrial fibrillation  Allergies  Allergen Reactions   Isosorbide Cough    Patient Measurements: Height: 6\' 1"  (185.4 cm) Weight: 111.8 kg (246 lb 8 oz) IBW/kg (Calculated) : 79.9 Heparin Dosing Weight: 103.5 kg  Vital Signs: Temp: 98.2 F (36.8 C) (05/09 1250) Temp Source: Oral (05/09 1250) BP: 201/91 (05/09 1500) Pulse Rate: 118 (05/09 1500)  Labs: Recent Labs    12/20/22 1258 12/20/22 1443  HGB 11.0*  --   HCT 35.7*  --   PLT 141*  --   CREATININE 1.11  --   TROPONINIHS 12 14    Estimated Creatinine Clearance: 75.4 mL/min (by C-G formula based on SCr of 1.11 mg/dL).   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  EKG was found to be concerning for Afib.  Pharmacy consulted to initiate heparin infusion.  Goal of Therapy:  Heparin level 0.3-0.7 units/ml Monitor platelets by anticoagulation protocol: Yes   Plan:  Give 4000 units bolus x 1 Start heparin infusion at 1500 units/hr Check anti-Xa level in 8 hours and daily while on heparin Continue to monitor H&H and platelets  Barrie Folk, PharmD 12/20/2022,4:29 PM

## 2022-12-20 NOTE — ED Provider Notes (Signed)
[facility] Provider Note    Event Date/Time   First MD Initiated Contact with Patient 12/20/22 1245     (approximate)   History   Shortness of Breath   HPI  [name] is a 77 y.o. male history of coronary disease who comes in with concerns for shortness of breath.  Patient was scheduled a left heart cath today but is unable to lay flat due to increasing shortness of breath for the past 2 weeks.  Patient in his room air on baseline but requiring 2 L of oxygen and concerned about increasing leg swelling.  He reports some exertional chest pain and shortness of breath which was why they are planning to do a catheterization today however they were unable to do the cath due to him unable to lay flat.  Does report swelling in his legs but he reports that is baseline for him.  He states that he is not on any fluid pills.   Physical Exam   Triage Vital Signs: ED Triage Vitals  Enc Vitals Group     BP      Pulse      Resp      Temp      Temp src      SpO2      Weight      Height      Head Circumference      Peak Flow      Pain Score      Pain Loc      Pain Edu?      Excl. in GC?     Most recent vital signs: Vitals:   12/20/22 1250 12/20/22 1300  BP: (!) 178/88 (!) 190/75  Pulse: 63 68  Resp: 20 (!) 26  Temp: 98.2 F (36.8 C)   SpO2: 93% 94%     General: Awake, no distress.  CV:  Good peripheral perfusion.  Resp:  Normal effort.  Patient on 2 L Abd:  No distention.  Soft nontender Other:  Patient has edema noted with evidence of chronic venous stasis with some redness noted bilaterally.   ED Results / Procedures / Treatments   Labs (all labs ordered are listed, but only abnormal results are displayed) Labs Reviewed  CBC WITH DIFFERENTIAL/PLATELET - Abnormal; Notable for the following components:      Result Value   RBC 3.76 (*)    Hemoglobin 11.0 (*)    HCT 35.7 (*)    RDW 17.6 (*)    Platelets 141 (*)    Lymphs Abs 0.5 (*)     All other components within normal limits  COMPREHENSIVE METABOLIC PANEL - Abnormal; Notable for the following components:   Glucose, Bld 132 (*)    BUN 25 (*)    Alkaline Phosphatase 141 (*)    Total Bilirubin 1.5 (*)    All other components within normal limits  BRAIN NATRIURETIC PEPTIDE - Abnormal; Notable for the following components:   B Natriuretic Peptide 1,180.2 (*)    All other components within normal limits  URINALYSIS, ROUTINE W REFLEX MICROSCOPIC - Abnormal; Notable for the following components:   Color, Urine YELLOW (*)    APPearance HAZY (*)    Protein, ur >=300 (*)    All other components within normal limits  SARS CORONAVIRUS 2 BY RT PCR  TROPONIN I (HIGH SENSITIVITY)  TROPONIN I (HIGH SENSITIVITY)     EKG  My interpretation of EKG:  Atrial fibrillation rate of 70  without any ST elevation, T wave inversion in V2 and V3, normal intervals  RADIOLOGY I have reviewed the xray personally and interpreted patient has some edema noted bilaterally. PROCEDURES:  Critical Care performed: Yes, see critical care procedure note(s)  .1-3 Lead EKG Interpretation  Performed by: Concha Se, MD Authorized by: Concha Se, MD     Interpretation: normal     ECG rate:  60   ECG rate assessment: normal     Rhythm: atrial fibrillation     Ectopy: none     Conduction: normal   .Critical Care  Performed by: Concha Se, MD Authorized by: Concha Se, MD   Critical care provider statement:    Critical care time (minutes):  30   Critical care was necessary to treat or prevent imminent or life-threatening deterioration of the following conditions:  Respiratory failure   Critical care was time spent personally by me on the following activities:  Development of treatment plan with patient or surrogate, discussions with consultants, evaluation of patient's response to treatment, examination of patient, ordering and review of laboratory studies, ordering and review of  radiographic studies, ordering and performing treatments and interventions, pulse oximetry, re-evaluation of patient's condition and review of old charts    MEDICATIONS ORDERED IN ED: Medications  furosemide (LASIX) injection 40 mg (has no administration in time range)     IMPRESSION / MDM / ASSESSMENT AND PLAN / ED COURSE  I reviewed the triage vital signs and the nursing notes.   Patient's presentation is most consistent with acute presentation with potential threat to life or bodily function.   Patient comes in with inability to lay flat worsening leg swelling and inability to have catheterization done.  EKG looks like A-fib but patient is rate controlled.  Will get workup to evaluate for CHF.  I added on ultrasounds to evaluate for DVT given the leg swelling is pretty significant but I suspect this is more likely chronic venous stasis.  Patient was placed on 2 L of oxygen due to desaturations in Cath Lab.  Urine has protein in it but has had this previously question nephrotic syndrome not sure if he is ever seen nephrology before CBC shows stable hemoglobin.  CMP is reassuring.  Initial troponin is negative BNP however is elevated and chest x-ray looks concerning for some small effusions with vascular congestion.  Discussed with patient getting a repeat x-ray in 4 to 6 weeks.  This seems most consistent with CHF given his examination I will give a dose of IV Lasix.  I will discuss with the hospital team for admission.  DVT ultrasounds are negative and given patient's history and examination this seems most consistent with CHF at this time.  The patient is on the cardiac monitor to evaluate for evidence of arrhythmia and/or significant heart rate changes.      FINAL CLINICAL IMPRESSION(S) / ED DIAGNOSES   Final diagnoses:  Acute respiratory failure with hypoxia (HCC)  Acute on chronic congestive heart failure, unspecified heart failure type (HCC)     Rx / DC Orders   ED  Discharge Orders     None        Note:  This document was prepared using Dragon voice recognition software and may include unintentional dictation errors.   Concha Se, MD 12/20/22 1501

## 2022-12-20 NOTE — Assessment & Plan Note (Signed)
Per chart review, patient has had atrial fibrillation for a while now, however does not appear that anticoagulation has been initiated.  CHA2DS2-VASc is elevated at 6.  No contraindications at this time  - Metoprolol 25 mg twice daily - Start heparin infusion given possible need for catheterization while admitted

## 2022-12-20 NOTE — Assessment & Plan Note (Signed)
History of stable angina with plan for LHC today, however patient was unable to lay flat.   - Cardiology consulted; appreciate their recommendations - Nitropaste for 6 hours - Continue aspirin and statin

## 2022-12-20 NOTE — H&P (Addendum)
History and Physical    Patient: [name]:[id number] DOB: 06/07/1946 DOA: 12/20/2022 DOS: the patient was seen and examined on 12/20/2022 PCP: Jaclyn Shaggy, MD  Patient coming from: Home  Chief Complaint:  Chief Complaint  Patient presents with   Shortness of Breath   HPI: [name] is a 77 y.o. male with medical history significant of CAD s/p CABG and DES, severe pulmonary hypertension, hypertension, hyperlipidemia, OSA, type 2 diabetes, hypothyroidism, who presents to the ED due to shortness of breath.  [name] states he has been experiencing shortness of breath with exertion for the last several weeks that has been gradually worsening.  In addition, he has been experiencing orthopnea that is worsened particularly in the last 2 weeks. He endorses chest pain with exertion only, particularly when his oxygen levels are low after walking.  He notes that lower extremity edema is chronic and unchanged, however his legs appear slightly more red.  The weeping has been going on for a long time now as well.  He denies any nausea, vomiting, diarrhea, abdominal pain, abdominal distention, fever, chills.  Patient presented today to [facility] for his scheduled LHC, however was unable to lay flat.  Due to this, he was brought to the ED.  ED course: On arrival to the ED, patient was hypertensive at 167/93 with heart rate of 66.  He was saturating at 94% on 2 L.  He was afebrile at 97.9. Initial workup notable for hemoglobin of 11, platelets of 141, potassium 3.7, BUN 25, creatinine 1.11 with GFR over 60, BNP of 1180, troponin of 12.  Chest x-ray was obtained that demonstrated vascular congestion with small pleural effusions.  Bilateral DVT study negative.  Patient started on Lasix and TRH contacted for admission.  Review of Systems: As mentioned in the history of present illness. All other systems reviewed and are negative.  Past Medical History:  Diagnosis Date   Anemia    Arthritis     Atrial fibrillation (HCC)    AV block, 1st degree    Bundle branch block    Chronic kidney disease    patient not aware of this dx   Coronary artery disease    Diabetes mellitus without complication (HCC)    type 2   Dyspnea    GERD (gastroesophageal reflux disease)    History of blood transfusion    Hypertension    Sleep apnea    does not use cpap   Past Surgical History:  Procedure Laterality Date   APPENDECTOMY     CARDIAC CATHETERIZATION Left 08/16/2016   Procedure: Left Heart Cath and Coronary Angiography;  Surgeon: Alwyn Pea, MD;  Location: [facility] INVASIVE CV LAB;  Service: Cardiovascular;  Laterality: Left;   COLON SURGERY     part of colon removed   COLONOSCOPY N/A 09/07/2020   Procedure: COLONOSCOPY;  Surgeon: Pasty Spillers, MD;  Location: [facility] ENDOSCOPY;  Service: Endoscopy;  Laterality: N/A;   COLONOSCOPY WITH PROPOFOL N/A 05/17/2021   Procedure: COLONOSCOPY WITH PROPOFOL;  Surgeon: Pasty Spillers, MD;  Location: [facility] ENDOSCOPY;  Service: Endoscopy;  Laterality: N/A;   CORONARY ARTERY BYPASS GRAFT  08/30/2016   triple   CORONARY STENT INTERVENTION N/A 06/09/2020   Procedure: CORONARY STENT INTERVENTION;  Surgeon: Alwyn Pea, MD;  Location: [facility] INVASIVE CV LAB;  Service: Cardiovascular;  Laterality: N/A;   ENDOSCOPIC MUCOSAL RESECTION N/A 09/22/2020   Procedure: ENDOSCOPIC MUCOSAL RESECTION;  Surgeon: Meridee Score Netty Starring., MD;  Location: [facility]  ENDOSCOPY;  Service: Gastroenterology;  Laterality: N/A;   ESOPHAGOGASTRODUODENOSCOPY N/A 09/07/2020   Procedure: ESOPHAGOGASTRODUODENOSCOPY (EGD);  Surgeon: Pasty Spillers, MD;  Location: [facility] ENDOSCOPY;  Service: Endoscopy;  Laterality: N/A;   ESOPHAGOGASTRODUODENOSCOPY (EGD) WITH PROPOFOL N/A 09/22/2020   Procedure: ESOPHAGOGASTRODUODENOSCOPY (EGD) WITH PROPOFOL;  Surgeon: Meridee Score Netty Starring., MD;  Location: [facility] ENDOSCOPY;  Service: Gastroenterology;  Laterality: N/A;   FRACTURE SURGERY      HEMOSTASIS CLIP PLACEMENT  09/22/2020   Procedure: HEMOSTASIS CLIP PLACEMENT;  Surgeon: Lemar Lofty., MD;  Location: [facility] ENDOSCOPY;  Service: Gastroenterology;;   HEMOSTASIS CONTROL  09/22/2020   Procedure: HEMOSTASIS CONTROL;  Surgeon: Lemar Lofty., MD;  Location: [facility] ENDOSCOPY;  Service: Gastroenterology;;   LEFT HEART CATH AND CORONARY ANGIOGRAPHY Left 06/09/2020   Procedure: LEFT HEART CATH AND CORONARY ANGIOGRAPHY;  Surgeon: Alwyn Pea, MD;  Location: [facility] INVASIVE CV LAB;  Service: Cardiovascular;  Laterality: Left;   LEFT HEART CATH AND CORS/GRAFTS ANGIOGRAPHY Left 11/20/2016   Procedure: Left Heart Cath and Cors/Grafts Angiography;  Surgeon: Alwyn Pea, MD;  Location: [facility] INVASIVE CV LAB;  Service: Cardiovascular;  Laterality: Left;   POLYPECTOMY  09/22/2020   Procedure: POLYPECTOMY;  Surgeon: Mansouraty, Netty Starring., MD;  Location: [facility] ENDOSCOPY;  Service: Gastroenterology;;   SUBMUCOSAL LIFTING INJECTION  09/22/2020   Procedure: SUBMUCOSAL LIFTING INJECTION;  Surgeon: Lemar Lofty., MD;  Location: [facility] ENDOSCOPY;  Service: Gastroenterology;;   Social History:  reports that he has quit smoking. His smoking use included cigarettes. He has never used smokeless tobacco. He reports that he does not drink alcohol and does not use drugs.  Allergies  Allergen Reactions   Isosorbide Cough    History reviewed. No pertinent family history.  Prior to Admission medications   Medication Sig Start Date End Date Taking? Authorizing Provider  acetaminophen (TYLENOL) 325 MG tablet Take 2 tablets (650 mg total) by mouth every 6 (six) hours as needed for mild pain (or Fever >/= 101). Patient not taking: Reported on 08/21/2022 09/08/20   Lonia Blood, MD  amLODipine (NORVASC) 10 MG tablet Take 10 mg by mouth daily.    [provider]  aspirin EC 81 MG tablet Take 81 mg by mouth daily. Swallow whole.    [provider]  clopidogrel  (PLAVIX) 75 MG tablet Take 75 mg by mouth daily.    [provider]  dexlansoprazole (DEXILANT) 60 MG capsule Take 1 capsule by mouth daily. 12/25/21   [provider]  hydrALAZINE (APRESOLINE) 50 MG tablet Take 1 tablet by mouth 3 (three) times daily. 05/10/21   [provider]  hydrochlorothiazide (HYDRODIURIL) 12.5 MG tablet Take 12.5 mg by mouth daily.    [provider]  insulin glargine (LANTUS) 100 UNIT/ML injection Inject 25 Units into the skin at bedtime.     [provider]  irbesartan (AVAPRO) 300 MG tablet Take 1 tablet (300 mg total) by mouth daily. 09/09/20   Lonia Blood, MD  labetalol (NORMODYNE) 100 MG tablet Take 1 tablet by mouth 2 (two) times daily. 06/27/22   [provider]  metFORMIN (GLUCOPHAGE-XR) 500 MG 24 hr tablet Take 500 mg by mouth 2 (two) times daily.    [provider]  nebivolol (BYSTOLIC) 10 MG tablet Take 10 mg by mouth daily.    [provider]  rosuvastatin (CRESTOR) 40 MG tablet Take 40 mg by mouth daily. 06/22/20   [provider]  sitaGLIPtin (JANUVIA) 100 MG tablet Take 100 mg by  mouth daily.    [provider]  Testosterone 1.62 % GEL Apply 2 Pump topically daily. One pump on each arm 07/23/22   Harle Battiest, PA-C    Physical Exam: Vitals:   12/20/22 1715 12/20/22 1722 12/20/22 1740 12/20/22 1830  BP:   (!) 187/99 (!) 184/92  Pulse: 76  89 (!) 51  Resp: (!) 27  (!) 24 (!) 22  Temp:      TempSrc:      SpO2: (!) 86% 93% 95% 95%  Weight:      Height:       Physical Exam Vitals and nursing note reviewed.  Constitutional:      Appearance: He is obese.  HENT:     Head: Normocephalic and atraumatic.  Cardiovascular:     Rate and Rhythm: Normal rate and regular rhythm.     Heart sounds: Murmur heard.     Gallop (S3 vs persistently split S2) present.     Comments: Pitting edema extends to the bilateral upper thighs Pulmonary:     Effort: Pulmonary  effort is normal.  Musculoskeletal:     Right lower leg: 2+ Pitting Edema present.     Left lower leg: 2+ Pitting Edema present.  Skin:    General: Skin is warm and dry.     Comments: Bilateral significant venous stasis dermatitis, with some serous weeping. Not consistent with cellulitis given bilateral.   Neurological:     General: No focal deficit present.     Mental Status: He is alert and oriented to person, place, and time.  Psychiatric:        Mood and Affect: Mood normal.        Behavior: Behavior normal.      Data Reviewed: CBC with WBC of 6.5, hemoglobin 11.0, MCV 94 and platelets of 141 CMP with sodium of 141, potassium 3.7, bicarb 26, glucose 132, BUN 25, creatinine 1.11, alkaline phosphatase 141, and GFR above 60 BNP elevated at 1180 Troponin within normal limits at 12 Urinalysis with significant proteinuria but no other significant findings  EKG personally reviewed atrial fibrillation with right bundle branch block.  T wave inversions in lateral leads.  No acutely ischemic appearing changes.  US Venous Img Lower Bilateral  Result Date: 12/20/2022 CLINICAL DATA:  Bilateral lower extremity swelling and pain EXAM: BILATERAL LOWER EXTREMITY VENOUS DOPPLER ULTRASOUND TECHNIQUE: Gray-scale sonography with graded compression, as well as color Doppler and duplex ultrasound were performed to evaluate the lower extremity deep venous systems from the level of the common femoral vein and including the common femoral, femoral, profunda femoral, popliteal and calf veins including the posterior tibial, peroneal and gastrocnemius veins when visible. Spectral Doppler was utilized to evaluate flow at rest and with distal augmentation maneuvers in the common femoral, femoral and popliteal veins. COMPARISON:  None Available. FINDINGS: RIGHT LOWER EXTREMITY Common Femoral Vein: No evidence of thrombus. Normal compressibility, respiratory phasicity and response to augmentation. Saphenofemoral  Junction: No evidence of thrombus. Normal compressibility and flow on color Doppler imaging. Profunda Femoral Vein: No evidence of thrombus. Normal compressibility and flow on color Doppler imaging. Femoral Vein: No evidence of thrombus. Normal compressibility, respiratory phasicity and response to augmentation. Popliteal Vein: No evidence of thrombus. Normal compressibility, respiratory phasicity and response to augmentation. Calf Veins: No evidence of thrombus. Normal compressibility and flow on color Doppler imaging. LEFT LOWER EXTREMITY Common Femoral Vein: No evidence of thrombus. Normal compressibility, respiratory phasicity and response to augmentation. Saphenofemoral Junction: No evidence of  thrombus. Normal compressibility and flow on color Doppler imaging. Profunda Femoral Vein: No evidence of thrombus. Normal compressibility and flow on color Doppler imaging. Femoral Vein: No evidence of thrombus. Normal compressibility, respiratory phasicity and response to augmentation. Popliteal Vein: No evidence of thrombus. Normal compressibility, respiratory phasicity and response to augmentation. Calf Veins: No evidence of thrombus. Normal compressibility and flow on color Doppler imaging. Other Findings: Benign-appearing fatty replaced elongated lymph nodes noted bilaterally. Extremity subcutaneous edema present bilaterally. IMPRESSION: No evidence of deep venous thrombosis in either lower extremity. Electronically Signed   By: Judie Petit.  Shick M.D.   On: 12/20/2022 14:38   DG Chest Portable 1 View  Result Date: 12/20/2022 CLINICAL DATA:  Shortness of breath EXAM: PORTABLE CHEST 1 VIEW COMPARISON:  X-ray 08/21/2020 FINDINGS: Underinflation with bilateral small pleural effusions, new from previous. Adjacent opacity. Vascular congestion. No pneumothorax. Status post median sternotomy. The heart is enlarged. Overlapping cardiac leads. IMPRESSION: Postop chest. Enlarged cardiopericardial silhouette with vascular  congestion and small effusions. Adjacent lung opacities. Recommend follow-up Electronically Signed   By: Karen Kays M.D.   On: 12/20/2022 13:37    Results are pending, will review when available.  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  He had initially come to the hospital for a left heart cath but was unable to lay flat.  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  Cardiology has been consulted and initiated Lasix drip.  - Cardiology consulted; appreciate their recommendations - Continue Lasix drip per cardiology - Echocardiogram ordered. - Strict in and out - Daily weights - Nitroglycerin paste every 6 hours  CAD (coronary artery disease), native coronary artery History of stable angina with plan for LHC today, however patient was unable to lay flat.   - Cardiology consulted; appreciate their recommendations - Nitropaste for 6 hours - Continue aspirin and statin  Paroxysmal atrial fibrillation (HCC) Per chart review, patient has had atrial fibrillation for a while now, however does not appear that anticoagulation has been initiated.  CHA2DS2-VASc is elevated at 6.  No contraindications at this time  - Metoprolol 25 mg twice daily - Start heparin infusion given possible need for catheterization while admitted  Chronic venous stasis Bilateral woody appearance of lower extremities consistent with chronic venous stasis.  Patient states that lower extremity edema below the knees is unchanged compared to prior and has been this way for years, however I do suspect that they are slightly worsened given significant hypervolemia    - Unna Boots ordered to help with wound healing and  compression  OSA (obstructive sleep apnea) - CPAP at bedtime  Essential hypertension Since admission, patient has been started on Lasix infusion, metoprolol and nitroglycerin.  At home, he was previously on amlodipine, hydralazine, HCTZ, irbesartan, labetalol.  He remains hypertensive above goal at this time.   -Will discuss with cardiology regarding restarting home antihypertensives, as he would likely benefit from at least 1 or 2 agents being restarted today.  Insulin dependent type 2 diabetes mellitus (HCC) - Hold home antiglycemic agents - A1c pending - SSI, moderate - Semglee 12 units at bedtime  Advance Care Planning:   Code Status: Full Code verified by patient with his son at bedside.  He would not want long-term life support but is  okay with short-term resuscitative efforts  Consults: Cardiology  Family Communication: Patient's son updated bedside  Severity of Illness: The appropriate patient status for this patient is INPATIENT. Inpatient status is judged to be reasonable and necessary in order to provide the required intensity of service to ensure the patient's safety. The patient's presenting symptoms, physical exam findings, and initial radiographic and laboratory data in the context of their chronic comorbidities is felt to place them at high risk for further clinical deterioration. Furthermore, it is not anticipated that the patient will be medically stable for discharge from the hospital within 2 midnights of admission.   * I certify that at the point of admission it is my clinical judgment that the patient will require inpatient hospital care spanning beyond 2 midnights from the point of admission due to high intensity of service, high risk for further deterioration and high frequency of surveillance required.*  Author: Verdene Lennert, MD 12/20/2022 7:02 PM  For on call review [email or web address].

## 2022-12-20 NOTE — Progress Notes (Signed)
Patient came in for left heart cath today, patient SOB on exertion, patients O2 sats 86% on RA, placed on 2L [place] came up to 94%, patient BLE both weeping and OTA, foul smelling drainage noted, patient states this has been going on for last 2-3 weeks and has not seen a doctor yet, Dr Juliann Pares messaged about both issues, patient here with son Ethelene Browns, patient went back for procedure but unable to lay flat, procedure cancelled, Dr Juliann Pares wants patient to go to ED for evaluation, patient and son aware, patient back in Specials in bay 12 waiting for ED bed.  Vidal Schwalbe, RN

## 2022-12-20 NOTE — Progress Notes (Signed)
Report given to ED RN and patient transported in stable condition to ED #[id number].  Vidal Schwalbe, RN

## 2022-12-21 ENCOUNTER — Other Ambulatory Visit (HOSPITAL_COMMUNITY): Payer: Self-pay

## 2022-12-21 ENCOUNTER — Telehealth (HOSPITAL_COMMUNITY): Payer: Self-pay | Admitting: Pharmacy Technician

## 2022-12-21 ENCOUNTER — Encounter: Payer: Self-pay | Admitting: Oncology

## 2022-12-21 DIAGNOSIS — I5031 Acute diastolic (congestive) heart failure: Secondary | ICD-10-CM | POA: Diagnosis not present

## 2022-12-21 LAB — CBC WITH DIFFERENTIAL/PLATELET
Abs Immature Granulocytes: 0.03 10*3/uL (ref 0.00–0.07)
Basophils Absolute: 0 10*3/uL (ref 0.0–0.1)
Basophils Relative: 1 %
Eosinophils Absolute: 0 10*3/uL (ref 0.0–0.5)
Eosinophils Relative: 0 %
HCT: 36.1 % — ABNORMAL LOW (ref 39.0–52.0)
Hemoglobin: 11.3 g/dL — ABNORMAL LOW (ref 13.0–17.0)
Immature Granulocytes: 0 %
Lymphocytes Relative: 5 %
Lymphs Abs: 0.3 10*3/uL — ABNORMAL LOW (ref 0.7–4.0)
MCH: 29.5 pg (ref 26.0–34.0)
MCHC: 31.3 g/dL (ref 30.0–36.0)
MCV: 94.3 fL (ref 80.0–100.0)
Monocytes Absolute: 0.6 10*3/uL (ref 0.1–1.0)
Monocytes Relative: 8 %
Neutro Abs: 6.2 10*3/uL (ref 1.7–7.7)
Neutrophils Relative %: 86 %
Platelets: 125 10*3/uL — ABNORMAL LOW (ref 150–400)
RBC: 3.83 MIL/uL — ABNORMAL LOW (ref 4.22–5.81)
RDW: 17.3 % — ABNORMAL HIGH (ref 11.5–15.5)
WBC: 7.2 10*3/uL (ref 4.0–10.5)
nRBC: 0 % (ref 0.0–0.2)

## 2022-12-21 LAB — LIPID PANEL
Cholesterol: 85 mg/dL (ref 0–200)
HDL: 45 mg/dL (ref >40)
LDL Cholesterol: 29 mg/dL (ref 0–99)
Total CHOL/HDL Ratio: 1.9 RATIO
Triglycerides: 53 mg/dL (ref <150)
VLDL: 11 mg/dL (ref 0–40)

## 2022-12-21 LAB — HEMOGLOBIN A1C
Hgb A1c MFr Bld: 5.8 % — ABNORMAL HIGH (ref 4.8–5.6)
Mean Plasma Glucose: 119.76 mg/dL

## 2022-12-21 LAB — HEPARIN LEVEL (UNFRACTIONATED)
Heparin Unfractionated: <0.1 IU/mL — ABNORMAL LOW (ref 0.30–0.70)
Heparin Unfractionated: 0.22 IU/mL — ABNORMAL LOW (ref 0.30–0.70)

## 2022-12-21 LAB — BASIC METABOLIC PANEL
Anion gap: 9 (ref 5–15)
BUN: 23 mg/dL (ref 8–23)
CO2: 25 mmol/L (ref 22–32)
Calcium: 10.2 mg/dL (ref 8.9–10.3)
Chloride: 104 mmol/L (ref 98–111)
Creatinine, Ser: 1.19 mg/dL (ref 0.61–1.24)
GFR, Estimated: >60 mL/min (ref >60)
Glucose, Bld: 108 mg/dL — ABNORMAL HIGH (ref 70–99)
Potassium: 3.8 mmol/L (ref 3.5–5.1)
Sodium: 138 mmol/L (ref 135–145)

## 2022-12-21 LAB — GLUCOSE, CAPILLARY
Glucose-Capillary: 106 mg/dL — ABNORMAL HIGH (ref 70–99)
Glucose-Capillary: 112 mg/dL — ABNORMAL HIGH (ref 70–99)
Glucose-Capillary: 160 mg/dL — ABNORMAL HIGH (ref 70–99)

## 2022-12-21 LAB — MAGNESIUM: Magnesium: 1.9 mg/dL (ref 1.7–2.4)

## 2022-12-21 MED ORDER — SPIRONOLACTONE 12.5 MG HALF TABLET: 12.5000 mg | ORAL_TABLET | Freq: Every day | ORAL | Status: DC

## 2022-12-21 MED ORDER — HEPARIN BOLUS VIA INFUSION
3100.0000 [IU] | Freq: Once | INTRAVENOUS | Status: AC
  Administered 2022-12-21: 3100 [IU] via INTRAVENOUS
  Filled 2022-12-21: qty 3100

## 2022-12-21 MED ORDER — HEPARIN BOLUS VIA INFUSION
1550.0000 [IU] | Freq: Once | INTRAVENOUS | Status: AC
  Administered 2022-12-21: 1550 [IU] via INTRAVENOUS
  Filled 2022-12-21: qty 1550

## 2022-12-21 MED ORDER — SPIRONOLACTONE 12.5 MG HALF TABLET
12.5000 mg | ORAL_TABLET | Freq: Every day | ORAL | Status: DC
  Administered 2022-12-21 – 2022-12-22 (×2): 12.5 mg via ORAL
  Filled 2022-12-21 (×2): qty 1

## 2022-12-21 MED ORDER — POTASSIUM CHLORIDE CRYS ER 20 MEQ PO TBCR
40.0000 meq | EXTENDED_RELEASE_TABLET | Freq: Once | ORAL | Status: AC
  Administered 2022-12-21: 40 meq via ORAL
  Filled 2022-12-21: qty 2

## 2022-12-21 NOTE — Evaluation (Signed)
Occupational Therapy Evaluation Patient Details Name: [name] MRN: [id number] DOB: 1946-05-09 Today's Date: 12/21/2022   History of Present Illness 77yoM with a PMH of CAD s/p CABG (LIMA-LAD, SVG - LCX 100% occluded, SVG - PDA s/p PCI (2021) with prior stents, paroxysmal AF (not anticoagulated), HFpEF (severe LVH, mild AS, moderate AI, moderate to severe MR, moderate TR by echo 09/2022) DM2, CKD, OSA, who presented to [facility] for an outpatient left heart cath with Dr. Juliann Pares the morning of 5/9.  He was orthopneic with significant peripheral edema and was ultimately unable to lay flat for the procedure. He was referred to [facility] ED for further evaluation and management of acute on chronic heart failure.   Clinical Impression   Pt was seen for OT evaluation this date. Prior to hospital admission, pt was independent with ADL and mobility, however, pt does endorse that when he uses his pulse oximeter he gets down to 83-84% on room air and takes a bit to come back up to mid 90's. No falls in past 40mo. Pt presents to acute OT demonstrating impaired ADL performance and functional mobility 2/2 cardiopulmonary status, decr BLE strength, balance, and activity tolerance (See OT problem list). Pt currently requires SBA for ADL transfers, CGA for completing LB dressing in standing involving UE versus no UE support on RW in standing. Able to doff/don socks seated in recliner with incr time/effort but no direct assist required. SpO2 88-89% on 3L after briefly marching in place and sitting promptly 2/2 pt endorsing urgent need to use the urinal. With time, improves to low 90's on 3L.   Pt educated in use of pulse oximeter to support activity and safety to ensure SpO2 does not decrease too low and support pt in knowing when to take rest breaks. Educated in activity pacing and need for rest breaks. Educated in home/routines modifications and handheld showerhead for improved comfort/safety with seated shower. Educated  in PLB for breath recovery. Instructed in strategy for making compression stockings easier to don. Pt verbalizes understanding. Pt would benefit from skilled OT services to address noted impairments and functional limitations (see below for any additional details) in order to maximize safety and independence while minimizing falls risk and caregiver burden.     Recommendations for follow up therapy are one component of a multi-disciplinary discharge planning process, led by the attending physician.  Recommendations may be updated based on patient status, additional functional criteria and insurance authorization.   Assistance Recommended at Discharge PRN  Patient can return home with the following A little help with walking and/or transfers;A little help with bathing/dressing/bathroom;Assistance with cooking/housework;Assist for transportation;Help with stairs or ramp for entrance    Functional Status Assessment  Patient has had a recent decline in their functional status and demonstrates the ability to make significant improvements in function in a reasonable and predictable amount of time.  Equipment Recommendations  Other (comment) (HH shower head)    Recommendations for Other Services       Precautions / Restrictions Precautions Precautions: Fall Restrictions Weight Bearing Restrictions: No      Mobility Bed Mobility               General bed mobility comments: NT in recliner    Transfers Overall transfer level: Needs assistance Equipment used: Rolling walker (2 wheels) Transfers: Sit to/from Stand Sit to Stand: Supervision, Min guard           General transfer comment: slight LOB posteriorly initially  Balance Overall balance assessment: Needs assistance Sitting-balance support: Feet supported Sitting balance-Leahy Scale: Good       Standing balance-Leahy Scale: Fair                             ADL either performed or assessed with  clinical judgement   ADL                                         General ADL Comments: Pt able to doff and don socks from seated position with incr time/effort, CGA for LB dressing over hips in standing 2/2 decr balance, anticipate CGA for ADL transfers for toileting     Vision         Perception     Praxis      Pertinent Vitals/Pain Pain Assessment Pain Assessment: No/denies pain     Hand Dominance     Extremity/Trunk Assessment Upper Extremity Assessment Upper Extremity Assessment: Overall WFL for tasks assessed   Lower Extremity Assessment Lower Extremity Assessment: Generalized weakness   Cervical / Trunk Assessment Cervical / Trunk Assessment: Normal   Communication Communication Communication: No difficulties   Cognition Arousal/Alertness: Awake/alert Behavior During Therapy: WFL for tasks assessed/performed Overall Cognitive Status: Within Functional Limits for tasks assessed                                       General Comments       Exercises Other Exercises Other Exercises: Pt educated in use of pulse oximeter to support activity and safety to ensure SpO2 does not decrease too low and support pt in knowing when to take rest breaks. Educated in activity pacing and need for rest breaks. Educated in home/routines modifications and handheld showerhead for improved comfort/safety with seated shower. Educated in PLB for breath recovery. Instructed in strategy for making compression stockings easier to don.   Shoulder Instructions      Home Living Family/patient expects to be discharged to:: Private residence Living Arrangements: Spouse/significant other Available Help at Discharge: Family Type of Home: House Home Access: Stairs to enter Secretary/administrator of Steps: 3 Entrance Stairs-Rails: Left;Right;Can reach both Home Layout: One level     Bathroom Shower/Tub: Producer, television/film/video: Standard      Home Equipment: Cane - single point;Toilet riser (WC, RW (belongs to wife))          Prior Functioning/Environment Prior Level of Function : Independent/Modified Independent;Driving             Mobility Comments: stated he "occasionally uses a SPC"          OT Problem List: Cardiopulmonary status limiting activity;Decreased activity tolerance;Impaired balance (sitting and/or standing);Decreased knowledge of use of DME or AE      OT Treatment/Interventions: Self-care/ADL training;Therapeutic exercise;Therapeutic activities;Energy conservation;DME and/or AE instruction;Patient/family education;Balance training    OT Goals(Current goals can be found in the care plan section) Acute Rehab OT Goals Patient Stated Goal: return to PLOF OT Goal Formulation: With patient/family Time For Goal Achievement: 01/04/23 Potential to Achieve Goals: Good ADL Goals Pt Will Transfer to Toilet: with modified independence;ambulating (LRAD) Pt Will Perform Toileting - Clothing Manipulation and hygiene: with modified independence Additional ADL Goal #1: Pt will demo independence with pulse oximeter use  during ADL/mobility to self-monitor HR/SpO2 in order to maximize safety, 2/2 opportunities. Additional ADL Goal #2: Pt will utilize learned ECS during ADL tasks without VC to utilize, 2/2 opportunities.  OT Frequency: Min 1X/week    Co-evaluation              AM-PAC OT "6 Clicks" Daily Activity     Outcome Measure Help from another person eating meals?: None Help from another person taking care of personal grooming?: None Help from another person toileting, which includes using toliet, bedpan, or urinal?: A Little Help from another person bathing (including washing, rinsing, drying)?: A Little Help from another person to put on and taking off regular upper body clothing?: None Help from another person to put on and taking off regular lower body clothing?: A Little 6 Click Score: 21   End  of Session Equipment Utilized During Treatment: Rolling walker (2 wheels);Oxygen  Activity Tolerance: Patient tolerated treatment well Patient left: in chair;with call bell/phone within reach;with family/visitor present  OT Visit Diagnosis: Other abnormalities of gait and mobility (R26.89)                Time: 1610-9604 OT Time Calculation (min): 17 min Charges:  OT General Charges $OT Visit: 1 Visit OT Evaluation $OT Eval Low Complexity: 1 Low OT Treatments $Self Care/Home Management : 8-22 mins  Arman Filter., MPH, MS, OTR/L ascom [phone] 12/21/22, 1:03 PM

## 2022-12-21 NOTE — Plan of Care (Signed)

## 2022-12-21 NOTE — Progress Notes (Signed)
PROGRESS NOTE    METRO PICKETT  ZOX:[id number] DOB: 1946/05/06 DOA: 12/20/2022 PCP: Jaclyn Shaggy, MD  250A/250A-AA  LOS: 1 day   Brief hospital course:   Assessment & Plan: [name] is a 77 y.o. male with medical history significant of CAD s/p CABG and DES, severe pulmonary hypertension, hypertension, hyperlipidemia, OSA, type 2 diabetes, hypothyroidism, who presents to the ED due to shortness of breath.     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  He had initially come to the hospital for a left heart cath but was unable to lay flat.  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  Cardiology has been consulted and initiated Lasix drip. Plan: --cont lasix gtt at increased rate of 10 mg/hr --cont Nitropaste 1 inch every 6 hours to assist with vasodilation and BP control -cont metoprolol tartrate 25 mg twice daily (new) -Continue hydralazine 50 mg 3 times daily -Start spironolactone 12.5 mg daily  Acute hypoxemic respiratory failure --O2 sats 86% on RA, placed on 2L [place]. --Continue supplemental O2 to keep sats >=90%, wean as tolerated  # CAD s/p CABG and multiple prior PCI's  History of stable angina with plan for LHC today, however patient was unable to lay flat.  --Chest pain-free at rest, EKGs nonacute, troponins negative.  --cont plavix and statin  Paroxysmal atrial fibrillation (HCC) Per chart review, patient has had atrial fibrillation for a while now, however does not appear that anticoagulation has been initiated.  CHA2DS2-VASc is elevated at 6.  No contraindications at this time.  Started on heparin gtt by cardio Plan: --cont heparin gtt for stroke ppx, per cardio --cont metop  (new)  Chronic venous stasis Bilateral woody appearance of lower extremities consistent with chronic venous stasis.  Patient states that lower extremity edema below the knees is unchanged compared to prior and has been this way for years, however I do suspect that they are slightly worsened given significant hypervolemia    - Unna Boots ordered to help with wound healing and compression  OSA (obstructive sleep apnea) - CPAP at bedtime  Essential hypertension Since admission, patient has been started on Lasix infusion, metoprolol and nitroglycerin.  At home, he was previously on amlodipine, hydralazine, HCTZ, irbesartan, labetalol.  He remains hypertensive above goal at this time.  --cont hydralazine, Lopressor  --start aldactone  Insulin dependent type 2 diabetes mellitus (HCC) --A1c 5.8, well controlled - SSI, moderate - Semglee 12 units at bedtime   DVT prophylaxis: WJ:[id number] gtt Code Status: Full code  Family Communication: son updated at bedside today Level of care: Telemetry Cardiac Dispo:   The patient is from: home Anticipated d/c is to: home Anticipated d/c date is: 2-3 days   Subjective and Interval History:  Pt reported breathing improved.     Objective: Vitals:   12/21/22 0534 12/21/22 0816 12/21/22 1204 12/21/22 1550  BP: (!) 164/68 (!) 146/87 (!) 182/82 (!) 171/79  Pulse: (!) 56 65 63 (!) 59  Resp: 18 18 18 18   Temp: 97.8 F (36.6 C) 98.3 F (36.8 C) 97.7 F (36.5 C) 97.7 F (36.5 C)  TempSrc: Oral Oral    SpO2: 100% 95% 96% 98%  Weight: 111.6 kg     Height:        Intake/Output Summary (Last  24 hours) at 12/21/2022 1807 Last data filed at 12/21/2022 1700 Gross per 24 hour  Intake 1142.18 ml  Output 2960 ml  Net -1817.82 ml   Filed Weights   12/20/22 1249 12/21/22 0534  Weight: 111.8 kg 111.6 kg    Examination:   Constitutional: NAD, AAOx3 HEENT: conjunctivae and lids normal, EOMI CV: No cyanosis.   RESP: normal respiratory effort, on  3L Extremities: tense pitting edema in BLE SKIN: warm, dry Neuro: II - XII grossly intact.   Psych: Normal mood and affect.  Appropriate judgement and reason   Data Reviewed: I have personally reviewed labs and imaging studies  Time spent: 50 minutes  Darlin Priestly, MD [facility] If 7PM-7AM, please contact night-coverage 12/21/2022, 6:07 PM

## 2022-12-21 NOTE — Evaluation (Signed)
Physical Therapy Evaluation Patient Details Name: [name] MRN: [id number] DOB: June 09, 1946 Today's Date: 12/21/2022  History of Present Illness  77yoM with a PMH of CAD s/p CABG (LIMA-LAD, SVG - LCX 100% occluded, SVG - PDA s/p PCI (2021) with prior stents, paroxysmal AF (not anticoagulated), HFpEF (severe LVH, mild AS, moderate AI, moderate to severe MR, moderate TR by echo 09/2022) DM2, CKD, OSA, who presented to [facility] for an outpatient left heart cath with Dr. Juliann Pares the morning of 5/9.  He was orthopneic with significant peripheral edema and was ultimately unable to lay flat for the procedure. He was referred to [facility] ED for further evaluation and management of acute on chronic heart failure.   Clinical Impression  Patient sleeping sitting on side of bed, wakes to voice, family at bedside. Pt reported at baseline he is independent (says he has a cane that he uses occasionally), lives with his wife and his son lives right behind him who can assists with IADLs if needed. Pt able to drive, cook, clean, but in the last several weeks has more trouble due to SOB.  The patient was able to perform sit <> Stand with RW and supervision twice, cued for hand placement. Trialed on room air b ut with mobility dropped to 87%, returned to 3L for remainder of session. He ambulated ~34ft but deferred a further distance due to lasix. Up in chair with needs in reach.  Overall the patient demonstrated deficits (see "PT Problem List") that impede the patient's functional abilities, safety, and mobility and would benefit from skilled PT intervention. Recommendation is to continue skilled PT services to maximize mobility and activity tolerance/endurance.         Recommendations for follow up therapy are one component of a multi-disciplinary discharge planning process, led by the attending physician.  Recommendations may be updated based on patient status, additional functional criteria and insurance  authorization.  Follow Up Recommendations       Assistance Recommended at Discharge Intermittent Supervision/Assistance  Patient can return home with the following  Assistance with cooking/housework;Assist for transportation;Help with stairs or ramp for entrance    Equipment Recommendations Other (comment) (TBD)  Recommendations for Other Services       Functional Status Assessment Patient has had a recent decline in their functional status and demonstrates the ability to make significant improvements in function in a reasonable and predictable amount of time.     Precautions / Restrictions Precautions Precautions: Fall Restrictions Weight Bearing Restrictions: No      Mobility  Bed Mobility               General bed mobility comments: pt sitting EOB at start of session    Transfers Overall transfer level: Needs assistance Equipment used: Rolling walker (2 wheels) Transfers: Sit to/from Stand Sit to Stand: Supervision           General transfer comment: cued for hand placement, RW not necessarily required    Ambulation/Gait Ambulation/Gait assistance: Min guard Gait Distance (Feet): 20 Feet Assistive device: Rolling walker (2 wheels)         General Gait Details: pt deferred further distance due to lasix. RW may not have been necessarily required  Stairs            Wheelchair Mobility    Modified Rankin (Stroke Patients Only)       Balance Overall balance assessment: Needs assistance Sitting-balance support: Feet supported Sitting balance-Leahy Scale: Good       Standing  balance-Leahy Scale: Good                               Pertinent Vitals/Pain Pain Assessment Pain Assessment: No/denies pain    Home Living Family/patient expects to be discharged to:: Private residence Living Arrangements: Spouse/significant other Available Help at Discharge: Family Type of Home: House Home Access: Stairs to enter Entrance  Stairs-Rails: Left;Right;Can reach both Secretary/administrator of Steps: 3   Home Layout: One level Home Equipment: Cane - single point;Toilet riser (WC, RW (belongs to wife))      Prior Function Prior Level of Function : Independent/Modified Independent;Driving             Mobility Comments: stated he "occasionally uses a SPC"       Hand Dominance        Extremity/Trunk Assessment   Upper Extremity Assessment Upper Extremity Assessment: Overall WFL for tasks assessed    Lower Extremity Assessment Lower Extremity Assessment: Generalized weakness    Cervical / Trunk Assessment Cervical / Trunk Assessment: Normal  Communication   Communication: No difficulties  Cognition Arousal/Alertness: Awake/alert Behavior During Therapy: WFL for tasks assessed/performed Overall Cognitive Status: Within Functional Limits for tasks assessed                                          General Comments      Exercises     Assessment/Plan    PT Assessment Patient needs continued PT services  PT Problem List Decreased strength;Decreased mobility;Decreased range of motion;Decreased activity tolerance;Decreased balance;Decreased knowledge of use of DME;Decreased knowledge of precautions       PT Treatment Interventions Therapeutic activities;DME instruction;Gait training;Therapeutic exercise;Patient/family education;Stair training;Balance training;Functional mobility training;Neuromuscular re-education    PT Goals (Current goals can be found in the Care Plan section)  Acute Rehab PT Goals Patient Stated Goal: to go home PT Goal Formulation: With patient Time For Goal Achievement: 01/04/23 Potential to Achieve Goals: Good    Frequency Min 2X/week     Co-evaluation               AM-PAC PT "6 Clicks" Mobility  Outcome Measure Help needed turning from your back to your side while in a flat bed without using bedrails?: None Help needed moving from  lying on your back to sitting on the side of a flat bed without using bedrails?: None Help needed moving to and from a bed to a chair (including a wheelchair)?: None Help needed standing up from a chair using your arms (e.g., wheelchair or bedside chair)?: None Help needed to walk in hospital room?: A Little Help needed climbing 3-5 steps with a railing? : A Little 6 Click Score: 22    End of Session Equipment Utilized During Treatment: Oxygen (0-3L) Activity Tolerance: Patient tolerated treatment well Patient left: in chair;with call bell/phone within reach;with family/visitor present Nurse Communication: Mobility status PT Visit Diagnosis: Other abnormalities of gait and mobility (R26.89);Difficulty in walking, not elsewhere classified (R26.2);Muscle weakness (generalized) (M62.81)    Time: 1610-9604 PT Time Calculation (min) (ACUTE ONLY): 17 min   Charges:   PT Evaluation $PT Eval Low Complexity: 1 Low PT Treatments $Therapeutic Activity: 8-22 mins        Olga Coaster PT, DPT 11:36 AM,12/21/22

## 2022-12-21 NOTE — Telephone Encounter (Signed)
Pharmacy Patient Advocate Encounter  Insurance verification completed.    The patient is insured through [employer] Medicare Part D   The patient is currently admitted and ran test claims for the following: Eliquis.  Copays and coinsurance results were relayed to Inpatient clinical team. 

## 2022-12-21 NOTE — Consult Note (Signed)
ANTICOAGULATION CONSULT NOTE  Pharmacy Consult for heparin infusion Indication: atrial fibrillation  Allergies  Allergen Reactions   Isosorbide Cough    Patient Measurements: Height: 6\' 1"  (185.4 cm) Weight: 111.6 kg (246 lb 0.5 oz) IBW/kg (Calculated) : 79.9 Heparin Dosing Weight: 103.5 kg  Vital Signs: Temp: 97.7 F (36.5 C) (05/10 1204) Temp Source: Oral (05/10 0816) BP: 182/82 (05/10 1204) Pulse Rate: 63 (05/10 1204)  Labs: Recent Labs    12/20/22 1258 12/20/22 1308 12/20/22 1443 12/21/22 0443 12/21/22 0605 12/21/22 1355  HGB 11.0*  --   --   --  11.3*  --   HCT 35.7*  --   --   --  36.1*  --   PLT 141*  --   --   --  125*  --   APTT  --  35  --   --   --   --   LABPROT  --  16.0*  --   --   --   --   INR  --  1.3*  --   --   --   --   HEPARINUNFRC  --   --   --  <0.10*  --  0.22*  CREATININE 1.11  --   --  1.19  --   --   TROPONINIHS 12  --  14  --   --   --      Estimated Creatinine Clearance: 70.2 mL/min (by C-G formula based on SCr of 1.19 mg/dL).   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  EKG was found to be concerning for Afib.  Pharmacy consulted to initiate heparin infusion.  Date Time aPTT/HL Rate/Comment 5/10 0443 <0.10  Subtherapeutic 5/10 1355 0.22  Subtherapeutic      Goal of Therapy:  Heparin level 0.3-0.7 units/ml Monitor platelets by anticoagulation protocol: Yes   Plan:  Bolus  heparin 1550 units x 1 and increase drip rate to 2100 un/hr HL check in 8 hrs CBC daily while on heparin  Will M. Dareen Piano, PharmD PGY-1 Pharmacy Resident 12/21/2022 3:29 PM

## 2022-12-21 NOTE — Progress Notes (Signed)
Pt refused bed alarm but was educated about safety.. Will continue to monitor. 

## 2022-12-21 NOTE — Progress Notes (Signed)
[facility] CLINIC CARDIOLOGY CONSULT NOTE       Patient ID: [name] MRN: [id number] DOB/AGE: September 29, 1945 77 y.o.  Admit date: 12/20/2022 Referring Physician Dr. Artis Delay Primary Physician  Primary Cardiologist Dr. Juliann Pares Reason for Consultation AoCHF  HPI: Christobal Alsbrooks. Fallah is a 72yoM with a PMH of CAD s/p CABG (LIMA-LAD, SVG - LCX 100% occluded, SVG - PDA s/p PCI (2021) with prior stents, paroxysmal AF (not anticoagulated), HFpEF (severe LVH, mild AS, moderate AI, moderate to severe MR, moderate TR by echo 09/2022) DM2, CKD, OSA, who presented to [facility] for an outpatient left heart cath with Dr. Juliann Pares the morning of 5/9.  He was orthopneic with significant peripheral edema and was ultimately unable to lay flat for the procedure. He was referred to [facility] ED for further evaluation and management of acute on chronic heart failure.  Interval history: -Feels a little better today, diuresing well on Lasix infusion. Net IO Since Admission: -2,097.82 mL [12/21/22 1609]  -Remains in AF on telemetry, rate controlled in the 50s to 60s -Lymphedema remains significant, albeit with marginal improvement -Denies chest pain, no dyspnea at rest but remains with supplemental oxygen  Review of systems complete and found to be negative unless listed above     Past Medical History:  Diagnosis Date   Anemia    Arthritis    Atrial fibrillation (HCC)    AV block, 1st degree    Bundle branch block    Chronic kidney disease    patient not aware of this dx   Coronary artery disease    Diabetes mellitus without complication (HCC)    type 2   Dyspnea    GERD (gastroesophageal reflux disease)    History of blood transfusion    Hypertension    Sleep apnea    does not use cpap    Past Surgical History:  Procedure Laterality Date   APPENDECTOMY     CARDIAC CATHETERIZATION Left 08/16/2016   Procedure: Left Heart Cath and Coronary Angiography;  Surgeon: Alwyn Pea, MD;  Location: [facility]  INVASIVE CV LAB;  Service: Cardiovascular;  Laterality: Left;   COLON SURGERY     part of colon removed   COLONOSCOPY N/A 09/07/2020   Procedure: COLONOSCOPY;  Surgeon: Pasty Spillers, MD;  Location: [facility] ENDOSCOPY;  Service: Endoscopy;  Laterality: N/A;   COLONOSCOPY WITH PROPOFOL N/A 05/17/2021   Procedure: COLONOSCOPY WITH PROPOFOL;  Surgeon: Pasty Spillers, MD;  Location: [facility] ENDOSCOPY;  Service: Endoscopy;  Laterality: N/A;   CORONARY ARTERY BYPASS GRAFT  08/30/2016   triple   CORONARY STENT INTERVENTION N/A 06/09/2020   Procedure: CORONARY STENT INTERVENTION;  Surgeon: Alwyn Pea, MD;  Location: [facility] INVASIVE CV LAB;  Service: Cardiovascular;  Laterality: N/A;   ENDOSCOPIC MUCOSAL RESECTION N/A 09/22/2020   Procedure: ENDOSCOPIC MUCOSAL RESECTION;  Surgeon: Meridee Score Netty Starring., MD;  Location: [facility] ENDOSCOPY;  Service: Gastroenterology;  Laterality: N/A;   ESOPHAGOGASTRODUODENOSCOPY N/A 09/07/2020   Procedure: ESOPHAGOGASTRODUODENOSCOPY (EGD);  Surgeon: Pasty Spillers, MD;  Location: [facility] ENDOSCOPY;  Service: Endoscopy;  Laterality: N/A;   ESOPHAGOGASTRODUODENOSCOPY (EGD) WITH PROPOFOL N/A 09/22/2020   Procedure: ESOPHAGOGASTRODUODENOSCOPY (EGD) WITH PROPOFOL;  Surgeon: Meridee Score Netty Starring., MD;  Location: [facility] ENDOSCOPY;  Service: Gastroenterology;  Laterality: N/A;   FRACTURE SURGERY     HEMOSTASIS CLIP PLACEMENT  09/22/2020   Procedure: HEMOSTASIS CLIP PLACEMENT;  Surgeon: Lemar Lofty., MD;  Location: [facility] ENDOSCOPY;  Service: Gastroenterology;;   HEMOSTASIS CONTROL  09/22/2020   Procedure: HEMOSTASIS  CONTROL;  Surgeon: Lemar Lofty., MD;  Location: [facility] ENDOSCOPY;  Service: Gastroenterology;;   LEFT HEART CATH AND CORONARY ANGIOGRAPHY Left 06/09/2020   Procedure: LEFT HEART CATH AND CORONARY ANGIOGRAPHY;  Surgeon: Alwyn Pea, MD;  Location: [facility] INVASIVE CV LAB;  Service: Cardiovascular;  Laterality: Left;   LEFT HEART CATH AND  CORS/GRAFTS ANGIOGRAPHY Left 11/20/2016   Procedure: Left Heart Cath and Cors/Grafts Angiography;  Surgeon: Alwyn Pea, MD;  Location: [facility] INVASIVE CV LAB;  Service: Cardiovascular;  Laterality: Left;   POLYPECTOMY  09/22/2020   Procedure: POLYPECTOMY;  Surgeon: Mansouraty, Netty Starring., MD;  Location: [facility] ENDOSCOPY;  Service: Gastroenterology;;   SUBMUCOSAL LIFTING INJECTION  09/22/2020   Procedure: SUBMUCOSAL LIFTING INJECTION;  Surgeon: Lemar Lofty., MD;  Location: [facility] ENDOSCOPY;  Service: Gastroenterology;;    Medications Prior to Admission  Medication Sig Dispense Refill Last Dose   amLODipine (NORVASC) 10 MG tablet Take 10 mg by mouth daily.   Past Week   aspirin EC 81 MG tablet Take 81 mg by mouth daily. Swallow whole.   Past Week   clopidogrel (PLAVIX) 75 MG tablet Take 75 mg by mouth daily.   Past Week   dexlansoprazole (DEXILANT) 60 MG capsule Take 1 capsule by mouth daily.   Past Week   hydrALAZINE (APRESOLINE) 50 MG tablet Take 1 tablet by mouth 3 (three) times daily.   Past Week   hydrochlorothiazide (HYDRODIURIL) 12.5 MG tablet Take 12.5 mg by mouth daily.   Past Week   insulin glargine (LANTUS) 100 UNIT/ML injection Inject 25 Units into the skin at bedtime.    Past Week   irbesartan (AVAPRO) 300 MG tablet Take 1 tablet (300 mg total) by mouth daily. 30 tablet 1 Past Week   labetalol (NORMODYNE) 100 MG tablet Take 1 tablet by mouth 2 (two) times daily.   Past Week   metFORMIN (GLUCOPHAGE-XR) 500 MG 24 hr tablet Take 500 mg by mouth 2 (two) times daily.   Past Week   rosuvastatin (CRESTOR) 40 MG tablet Take 40 mg by mouth daily.   Past Week   sitaGLIPtin (JANUVIA) 100 MG tablet Take 100 mg by mouth daily.   Past Week   Testosterone 1.62 % GEL Apply 2 Pump topically daily. One pump on each arm 225 g 3 Past Week   acetaminophen (TYLENOL) 325 MG tablet Take 2 tablets (650 mg total) by mouth every 6 (six) hours as needed for mild pain (or Fever >/= 101). (Patient not  taking: Reported on 08/21/2022)      nebivolol (BYSTOLIC) 10 MG tablet Take 10 mg by mouth daily. (Patient not taking: Reported on 12/20/2022)   Not Taking   torsemide (DEMADEX) 20 MG tablet Take 20 mg by mouth 2 (two) times daily.       Social History   Socioeconomic History   Marital status: Married    Spouse name: Not on file   Number of children: Not on file   Years of education: Not on file   Highest education level: Not on file  Occupational History   Not on file  Tobacco Use   Smoking status: Former    Types: Cigarettes   Smokeless tobacco: Never  Vaping Use   Vaping Use: Never used  Substance and Sexual Activity   Alcohol use: No   Drug use: No   Sexual activity: Not Currently  Other Topics Concern   Not on file  Social History Narrative   Not on file   Social  Determinants of Health   Financial Resource Strain: Not on file  Food Insecurity: Not on file  Transportation Needs: Not on file  Physical Activity: Not on file  Stress: Not on file  Social Connections: Not on file  Intimate Partner Violence: Not on file    History reviewed. No pertinent family history.    Intake/Output Summary (Last 24 hours) at 12/21/2022 1607 Last data filed at 12/21/2022 1035 Gross per 24 hour  Intake 912.18 ml  Output 3010 ml  Net -2097.82 ml    Vitals:   12/21/22 0534 12/21/22 0816 12/21/22 1204 12/21/22 1550  BP: (!) 164/68 (!) 146/87 (!) 182/82 (!) 171/79  Pulse: (!) 56 65 63 (!) 59  Resp: 18 18 18 18   Temp: 97.8 F (36.6 C) 98.3 F (36.8 C) 97.7 F (36.5 C) 97.7 F (36.5 C)  TempSrc: Oral Oral    SpO2: 100% 95% 96% 98%  Weight: 111.6 kg     Height:        PHYSICAL EXAM General: Pleasant ill-appearing, Caucasian male, upright in recliner with son at bedside HEENT:  Normocephalic and atraumatic. Neck:  + JVD.  Lungs: Normal respiratory effort on oxygen by nasal cannula crackles bilaterally to auscultation without appreciable wheezing.   Heart: Irregularly  irregular with controlled rate.  S1-S2 present 3/6 systolic murmur heard throughout Abdomen: distended appearing.  Msk: Normal strength and tone for age. Extremities: Tense woody lower extremity lymphedema bilaterally from toes up to thighs with weeping from wound on his right lower leg.  Generalized erythema to both lower legs. Neuro: Alert and oriented X 3. Psych:  Answers questions appropriately.   Labs: Basic Metabolic Panel: Recent Labs    12/20/22 1258 12/21/22 0443  NA 141 138  K 3.7 3.8  CL 105 104  CO2 26 25  GLUCOSE 132* 108*  BUN 25* 23  CREATININE 1.11 1.19  CALCIUM 10.3 10.2  MG  --  1.9    Liver Function Tests: Recent Labs    12/20/22 1258  AST 22  ALT 21  ALKPHOS 141*  BILITOT 1.5*  PROT 7.8  ALBUMIN 3.9    No results for input(s): "LIPASE", "AMYLASE" in the last 72 hours. CBC: Recent Labs    12/20/22 1258 12/21/22 0605  WBC 6.5 7.2  NEUTROABS 5.3 6.2  HGB 11.0* 11.3*  HCT 35.7* 36.1*  MCV 94.9 94.3  PLT 141* 125*    Cardiac Enzymes: Recent Labs    12/20/22 1258 12/20/22 1443  TROPONINIHS 12 14    BNP: Recent Labs    12/20/22 1258  BNP 1,180.2*    D-Dimer: No results for input(s): "DDIMER" in the last 72 hours. Hemoglobin A1C: Recent Labs    12/21/22 0605  HGBA1C 5.8*   Fasting Lipid Panel: Recent Labs    12/21/22 0443  CHOL 85  HDL 45  LDLCALC 29  TRIG 53  CHOLHDL 1.9   Thyroid Function Tests: No results for input(s): "TSH", "T4TOTAL", "T3FREE", "THYROIDAB" in the last 72 hours.  Invalid input(s): "FREET3" Anemia Panel: No results for input(s): "VITAMINB12", "FOLATE", "FERRITIN", "TIBC", "IRON", "RETICCTPCT" in the last 72 hours.   Radiology: US Venous Img Lower Bilateral  Result Date: 12/20/2022 CLINICAL DATA:  Bilateral lower extremity swelling and pain EXAM: BILATERAL LOWER EXTREMITY VENOUS DOPPLER ULTRASOUND TECHNIQUE: Gray-scale sonography with graded compression, as well as color Doppler and duplex  ultrasound were performed to evaluate the lower extremity deep venous systems from the level of the common femoral vein and including the  common femoral, femoral, profunda femoral, popliteal and calf veins including the posterior tibial, peroneal and gastrocnemius veins when visible. Spectral Doppler was utilized to evaluate flow at rest and with distal augmentation maneuvers in the common femoral, femoral and popliteal veins. COMPARISON:  None Available. FINDINGS: RIGHT LOWER EXTREMITY Common Femoral Vein: No evidence of thrombus. Normal compressibility, respiratory phasicity and response to augmentation. Saphenofemoral Junction: No evidence of thrombus. Normal compressibility and flow on color Doppler imaging. Profunda Femoral Vein: No evidence of thrombus. Normal compressibility and flow on color Doppler imaging. Femoral Vein: No evidence of thrombus. Normal compressibility, respiratory phasicity and response to augmentation. Popliteal Vein: No evidence of thrombus. Normal compressibility, respiratory phasicity and response to augmentation. Calf Veins: No evidence of thrombus. Normal compressibility and flow on color Doppler imaging. LEFT LOWER EXTREMITY Common Femoral Vein: No evidence of thrombus. Normal compressibility, respiratory phasicity and response to augmentation. Saphenofemoral Junction: No evidence of thrombus. Normal compressibility and flow on color Doppler imaging. Profunda Femoral Vein: No evidence of thrombus. Normal compressibility and flow on color Doppler imaging. Femoral Vein: No evidence of thrombus. Normal compressibility, respiratory phasicity and response to augmentation. Popliteal Vein: No evidence of thrombus. Normal compressibility, respiratory phasicity and response to augmentation. Calf Veins: No evidence of thrombus. Normal compressibility and flow on color Doppler imaging. Other Findings: Benign-appearing fatty replaced elongated lymph nodes noted bilaterally. Extremity  subcutaneous edema present bilaterally. IMPRESSION: No evidence of deep venous thrombosis in either lower extremity. Electronically Signed   By: Judie Petit.  Shick M.D.   On: 12/20/2022 14:38   DG Chest Portable 1 View  Result Date: 12/20/2022 CLINICAL DATA:  Shortness of breath EXAM: PORTABLE CHEST 1 VIEW COMPARISON:  X-ray 08/21/2020 FINDINGS: Underinflation with bilateral small pleural effusions, new from previous. Adjacent opacity. Vascular congestion. No pneumothorax. Status post median sternotomy. The heart is enlarged. Overlapping cardiac leads. IMPRESSION: Postop chest. Enlarged cardiopericardial silhouette with vascular congestion and small effusions. Adjacent lung opacities. Recommend follow-up Electronically Signed   By: Karen Kays M.D.   On: 12/20/2022 13:37    LHC 2021  LM lesion is 40% stenosed. Prox RCA lesion is 99% stenosed. Mid RCA lesion is 75% stenosed. Dist RCA lesion is 95% stenosed. RPDA lesion is 90% stenosed. SVG and is normal in caliber. The flow is not reversed. There is no competitive flow SVG and is normal in caliber. The flow is not reversed. There is no competitive flow Prox Graft lesion is 100% stenosed. LIMA and is normal in caliber. The flow is not reversed. There is no competitive flow Origin lesion is 95% stenosed. Previously placed Prox Cx to Dist Cx stent (unknown type) is widely patent. Dist Cx lesion is 90% stenosed. Previously placed Ost LAD to Mid LAD stent (unknown type) is widely patent. Origin to Prox Graft lesion is 100% stenosed. A drug-eluting stent was successfully placed using a STENT RESOLUTE ONYX 2.5X18. Post intervention, there is a 0% residual stenosis. Ost Cx lesion is 70% stenosed. Dist LAD-1 lesion is 50% stenosed. Previously placed Dist LAD-2 stent (unknown type) is widely patent. Ost LAD lesion is 75% stenosed. Preserved left ventricular function ejection fraction of 60%   Conclusion Normal left ventricular function  60% Coronaries Left main large calcified moderate lesion moderate to large calcium Distal LAD large ostial lesion 75% moderate calcification proximal LAD stent widely patent mid LAD stent widely patent distal LAD diffuse 50% Circumflex is large 70% ostial mid stent widely patent distal circumflex 95% TIMI-3 flow RCA medium proximal 90% lesion 90%  mid distal 75 Grafts LIMA to LAD incompletely evaluated probably occluded SVG to circumflex 100% occluded SVG to PDA 95% ostial in-stent TIMI-3 flow Intervention Successful PCI and stent to in-stent restenosis ostially with 2.5 x 18 mm resolute Onyx to 20 atm Lesion reduced from 95 down to 0% TIMI-3 flow maintained throughout   Successful PCI and stent ostial SVG in-stent restenosis. Lesion reduced from 95 down to 0%  ECHO  NORMAL LEFT VENTRICULAR SYSTOLIC FUNCTION   WITH SEVERE LVH  NORMAL RIGHT VENTRICULAR SYSTOLIC FUNCTION  MODERATE VALVULAR REGURGITATION (See above)  MILD VALVULAR STENOSIS (See above)  IRREGULAR HEART RHYTHM CAPTURED THROUGHOUT EXAM  ESTIMATED LVEF >55%  MODERATE AI  MILD AS; Max Velocity: 3.7m/s; AVA: 1.1cm^2  (PREV. ECHO: 2.3m/s; AVA: 1.9cm^2)  MODERATE - SEVERE MR (MVP: MILD - AMVL)  MODERATE TR; SEVERE PHTN  SEVERE LAE; MILD RAE  MILDLY DILATED ASCENDING AORTA MEASURING UP TO 3.7cm  PATIENT IMAGED SUPINE FROM APICALS TO PEDOFF TO SUBCOSTAL; PATIENT HAD DIFFICULTY LAYING  DOWN; FURTHER EVALUATION DID NOT SHOW ANY SIGNS OF TAMPONADE  _________________________________________________________________________________________  Electronically signed by    Dorothyann Peng, MD on 09/28/2022 07: 54 AM           Performed By: Verdis Prime     Ordering Physician: Dorothyann Peng, MD   TELEMETRY reviewed by me (LT) 12/21/2022 : AF rate 60s to 70s  EKG reviewed by me: AF RBBB and LAFB rate 70  Data reviewed by me (LT) 12/21/2022: Hospitalist progress note, nursing notes last 24h vitals tele labs imaging I/O    Principal  Problem:   Acute heart failure with preserved ejection fraction (HFpEF) (HCC) Active Problems:   Insulin dependent type 2 diabetes mellitus (HCC)   Essential hypertension   OSA (obstructive sleep apnea)   CAD (coronary artery disease), native coronary artery   Chronic venous stasis   Paroxysmal atrial fibrillation (HCC)    ASSESSMENT AND PLAN:  Normand Sloop P. Hnat is a 10yoM with a PMH of CAD s/p CABG (LIMA-LAD, SVG - LCX 100% occluded, SVG - PDA s/p PCI (2021) with prior stents, paroxysmal AF (not anticoagulated), HFpEF (severe LVH, mild AS, moderate AI, moderate to severe MR, moderate TR by echo 09/2022) DM2, CKD, OSA, who presented to [facility] for an outpatient left heart cath with Dr. Juliann Pares the morning of 5/9.  He was orthopneic with significant peripheral edema and was ultimately unable to lay flat for the procedure. He was referred to [facility] ED for further evaluation and management of acute on chronic heart failure.  # Acute on chronic HFpEF # Moderate to severe MR # Moderate AI Presents with 2 months of progressively worsening shortness of breath and exertional chest pain for which LHC was initially recommended by Dr. Juliann Pares.  Over the past 2 weeks his peripheral edema, orthopnea, and dyspnea have significantly worsened, refractory to outpatient torsemide.  He remains massively volume overloaded on exam with tense woody edema with weeping from wounds on his legs, with good initial diuresis.  Remains orthopneic with a supplemental oxygen requirement.  Anticipate need for at least several additional days of IV diuresis -Increase Lasix infusion to 10 mg/h -Continue Nitropaste 1 inch every 6 hours to assist with vasodilation and BP control -Continue metoprolol tartrate 25 mg twice daily -Continue hydralazine 50 mg 3 times daily -Start spironolactone 12.5 mg daily -Further additions of ARB, SGLT2i pending stability of his renal function. -Echo complete pending  # CAD s/p CABG and multiple  prior PCI's Chest pain-free at  rest, EKGs nonacute, troponins negative.  Plan to continue aspirin and statin.   No immediate plan for invasive cardiac diagnostics, but may consider LHC before discharge once volume status improves.  Continue management of his heart failure as above  # Paroxysmal atrial fibrillation Rate controlled on telemetry, was not on anticoagulation prior to this admission for unknown reasons.  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  Will continue metoprolol for rate control and heparin for anticoagulation.   -discussed the risks and benefits of chronic anticoagulation with Eliquis 5 mg twice daily for stroke prevention with the patient and his son.  He is agreeable to starting a DOAC at discharge with close monitoring for bleeding side effects and H&H. CHA2DS2-VASc 5 (age, CHF, HTN, CAD, )   This patient's plan of care was discussed and created with Dr. Darrold Junker and he is in agreement.  Signed: Rebeca Allegra , PA-C 12/21/2022, 4:07 PM [facility]

## 2022-12-21 NOTE — Progress Notes (Signed)
Mobility Specialist - Progress Note  Pre-mobility: SpO2(96) During mobility: SpO2(84) Post-mobility: SPO2(93)     12/21/22 1438  Mobility  Activity Ambulated with assistance in room;Dangled on edge of bed  Level of Assistance Standby assist, set-up cues, supervision of patient - no hands on  Assistive Device Front wheel walker  Distance Ambulated (ft) 40 ft  Range of Motion/Exercises Active  Activity Response Tolerated well  Mobility Referral Yes  $Mobility charge 1 Mobility  Mobility Specialist Start Time (ACUTE ONLY) 1322  Mobility Specialist Stop Time (ACUTE ONLY) 1342  Mobility Specialist Time Calculation (min) (ACUTE ONLY) 20 min   Pt resting in bed on 3L upon entry. Pt STS and ambulates to room door x2 with RW SBA. Pt desat to SpO2 84% after second lap. Pt returned to bed and given VC to perform deep breathing;SpO2 returns to 93%. Pt left with needs in reach. Pt family member present in room.   Johnathan Hausen Mobility Specialist 12/21/22, 2:48 PM

## 2022-12-21 NOTE — Consult Note (Signed)
   Heart Failure Nurse Navigator Note   HFpEF greater than 55%.  Moderate mitral valve regurgitation.  Mild tricuspid valve regurgitation.  Mild aortic stenosis.  Moderate LVH.  Mild right ventricular enlargement.  Mild biatrial enlargement.   Comorbidities:  Coronary artery disease with bypass grafting and stenting Pulmonary hypertension Atrial fibrillation Hypertension Obesity Obstructive sleep apnea Diabetes  Medications:  Plavix 75 mg daily Furosemide infusion at 8 mg an hour Hydralazine 50 mg 3 times a day Metoprolol tartrate 25 mg 2 times a day Crestor 40 mg daily  Labs:  Sodium 138, potassium 3.8, chloride 104, CO2 25, BUN 23, creatinine 1.19, estimated GFR greater than 60 Weight 111.6 kg Intake 552 mL Output 2510 mL   Initial meeting with patient and his son who was at the bedside.  Discussed heart failure and what it means.  He states that he lives with his wife and she is the one that prepares most of the meals.  Discussed removing the saltshaker from the table and the reasoning behind.  He voices understanding.  Went over abstaining from processed foods and eating foods in their fresh natural state.  Made aware of fluid restriction no more than 64 ounces in a days time, listed what is considered a liquid.  States that this time he does not weigh himself on a daily basis, explained the rationale behind weight being done daily reporting 2 pound weight gain overnight or 5 pounds within the week.  He is compliant with his medications.  He had questions about wearing compression stockings.  Made aware to put on first thing in the morning and then remove them when he is going to bed at night.  He was given the living with heart failure teaching booklet, zone magnet, info on low-sodium and heart failure along with weight chart.  He has an appointment in the outpatient heart failure clinic for May 23,2024.  He had no further questions.  Tresa Endo RN

## 2022-12-21 NOTE — TOC Benefit Eligibility Note (Addendum)
Patient Product/process development scientist completed.    The patient is currently admitted and upon discharge could be taking Eliquis 5 mg.  The current 30 day co-pay is $60.00.   The patient is currently admitted and upon discharge could be taking Jardiance 10 mg.  The current 30 day co-pay is $60.00.   The patient is insured through [employer]   This test claim was processed through [facility]- copay amounts may vary at other pharmacies due to pharmacy/plan contracts, or as the patient moves through the different stages of their insurance plan.  Roland Earl, CPHT Pharmacy Patient Advocate Specialist [facility] Patient Advocate Team Direct Number: [phone]  Fax: [phone]

## 2022-12-21 NOTE — Consult Note (Signed)
ANTICOAGULATION CONSULT NOTE  Pharmacy Consult for heparin infusion Indication: atrial fibrillation  Allergies  Allergen Reactions   Isosorbide Cough    Patient Measurements: Height: 6\' 1"  (185.4 cm) Weight: 111.8 kg (246 lb 8 oz) IBW/kg (Calculated) : 79.9 Heparin Dosing Weight: 103.5 kg  Vital Signs: Temp: 97.8 F (36.6 C) (05/10 0534) Temp Source: Oral (05/10 0059) BP: 164/68 (05/10 0534) Pulse Rate: 56 (05/10 0534)  Labs: Recent Labs    12/20/22 1258 12/20/22 1308 12/20/22 1443 12/21/22 0443  HGB 11.0*  --   --   --   HCT 35.7*  --   --   --   PLT 141*  --   --   --   APTT  --  35  --   --   LABPROT  --  16.0*  --   --   INR  --  1.3*  --   --   HEPARINUNFRC  --   --   --  <0.10*  CREATININE 1.11  --   --  1.19  TROPONINIHS 12  --  14  --      Estimated Creatinine Clearance: 70.3 mL/min (by C-G formula based on SCr of 1.19 mg/dL).   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  EKG was found to be concerning for Afib.  Pharmacy consulted to initiate heparin infusion.  Goal of Therapy:  Heparin level 0.3-0.7 units/ml Monitor platelets by anticoagulation protocol: Yes   Plan:  5/10:  HL @ 0443:  < 0.10, SUBtherapeutic - Will order heparin 3100 units IV X 1 bolus and increase drip rate to 1900 units/hr. - Will recheck HL 8 hrs after rate change.   Lubna Stegeman D, PharmD 12/21/2022,5:56 AM

## 2022-12-22 DIAGNOSIS — I5031 Acute diastolic (congestive) heart failure: Secondary | ICD-10-CM | POA: Diagnosis not present

## 2022-12-22 LAB — BASIC METABOLIC PANEL
Anion gap: 6 (ref 5–15)
BUN: 25 mg/dL — ABNORMAL HIGH (ref 8–23)
CO2: 30 mmol/L (ref 22–32)
Calcium: 9.9 mg/dL (ref 8.9–10.3)
Chloride: 101 mmol/L (ref 98–111)
Creatinine, Ser: 1.22 mg/dL (ref 0.61–1.24)
GFR, Estimated: >60 mL/min (ref >60)
Glucose, Bld: 97 mg/dL (ref 70–99)
Potassium: 3 mmol/L — ABNORMAL LOW (ref 3.5–5.1)
Sodium: 137 mmol/L (ref 135–145)

## 2022-12-22 LAB — HEPARIN LEVEL (UNFRACTIONATED)
Heparin Unfractionated: 0.27 IU/mL — ABNORMAL LOW (ref 0.30–0.70)
Heparin Unfractionated: 0.41 IU/mL (ref 0.30–0.70)
Heparin Unfractionated: 0.46 IU/mL (ref 0.30–0.70)

## 2022-12-22 LAB — MAGNESIUM: Magnesium: 1.8 mg/dL (ref 1.7–2.4)

## 2022-12-22 LAB — CBC
HCT: 29.7 % — ABNORMAL LOW (ref 39.0–52.0)
Hemoglobin: 9.5 g/dL — ABNORMAL LOW (ref 13.0–17.0)
MCH: 29.7 pg (ref 26.0–34.0)
MCHC: 32 g/dL (ref 30.0–36.0)
MCV: 92.8 fL (ref 80.0–100.0)
Platelets: 105 10*3/uL — ABNORMAL LOW (ref 150–400)
RBC: 3.2 MIL/uL — ABNORMAL LOW (ref 4.22–5.81)
RDW: 17 % — ABNORMAL HIGH (ref 11.5–15.5)
WBC: 4.3 10*3/uL (ref 4.0–10.5)
nRBC: 0 % (ref 0.0–0.2)

## 2022-12-22 LAB — GLUCOSE, CAPILLARY
Glucose-Capillary: 77 mg/dL (ref 70–99)
Glucose-Capillary: 236 mg/dL — ABNORMAL HIGH (ref 70–99)
Glucose-Capillary: 86 mg/dL (ref 70–99)
Glucose-Capillary: 167 mg/dL — ABNORMAL HIGH (ref 70–99)

## 2022-12-22 MED ORDER — POTASSIUM CHLORIDE CRYS ER 20 MEQ PO TBCR
40.0000 meq | EXTENDED_RELEASE_TABLET | Freq: Once | ORAL | Status: AC
  Administered 2022-12-22: 40 meq via ORAL
  Filled 2022-12-22: qty 2

## 2022-12-22 MED ORDER — MAGNESIUM SULFATE 2 GM/50ML IV SOLN
2.0000 g | Freq: Once | INTRAVENOUS | Status: AC
  Administered 2022-12-22: 2 g via INTRAVENOUS
  Filled 2022-12-22: qty 50

## 2022-12-22 MED ORDER — HEPARIN BOLUS VIA INFUSION
1500.0000 [IU] | Freq: Once | INTRAVENOUS | Status: AC
  Administered 2022-12-22: 1500 [IU] via INTRAVENOUS
  Filled 2022-12-22: qty 1500

## 2022-12-22 MED ORDER — INSULIN GLARGINE-YFGN 100 UNIT/ML ~~LOC~~ SOLN
10.0000 [IU] | Freq: Every day | SUBCUTANEOUS | Status: DC
  Administered 2022-12-22 – 2022-12-26 (×5): 10 [IU] via SUBCUTANEOUS
  Filled 2022-12-22 (×6): qty 0.1

## 2022-12-22 MED ORDER — SPIRONOLACTONE 25 MG PO TABS
25.0000 mg | ORAL_TABLET | Freq: Every day | ORAL | Status: DC
  Administered 2022-12-23 – 2022-12-27 (×5): 25 mg via ORAL
  Filled 2022-12-22 (×5): qty 1

## 2022-12-22 NOTE — Progress Notes (Signed)
[facility] CLINIC CARDIOLOGY CONSULT NOTE       Patient ID: [name] MRN: [id number] DOB/AGE: 1945-08-23 77 y.o.  Admit date: 12/20/2022 Referring Physician Dr. Artis Delay Primary Physician  Primary Cardiologist Dr. Juliann Pares Reason for Consultation AoCHF  HPI: [name] is a 77yoM with a PMH of CAD s/p CABG (LIMA-LAD, SVG - LCX 100% occluded, SVG - PDA s/p PCI (2021) with prior stents, paroxysmal AF (not anticoagulated), HFpEF (severe LVH, mild AS, moderate AI, moderate to severe MR, moderate TR by echo 09/2022) DM2, CKD, OSA, who presented to [facility] for an outpatient left heart cath with Dr. Juliann Pares the morning of 5/9.  He was orthopneic with significant peripheral edema and was ultimately unable to lay flat for the procedure. He was referred to [facility] ED for further evaluation and management of acute on chronic heart failure.  Interval history: -Patient reports he feels a little better than yesterday, still diuresing well with Lasix infusion. Net negative 5L this AM.  -Remains in atrial fibrillation on tele, rate controlled 40s-50s overnight. Pauses noted on tele. Patient denies dizziness, lightheadedness.  -Continued marginal improvement in LE edema.  -Denies CP, SOB. Still requiring 3L [place].   Review of systems complete and found to be negative unless listed above     Past Medical History:  Diagnosis Date   Anemia    Arthritis    Atrial fibrillation (HCC)    AV block, 1st degree    Bundle branch block    Chronic kidney disease    patient not aware of this dx   Coronary artery disease    Diabetes mellitus without complication (HCC)    type 2   Dyspnea    GERD (gastroesophageal reflux disease)    History of blood transfusion    Hypertension    Sleep apnea    does not use cpap    Past Surgical History:  Procedure Laterality Date   APPENDECTOMY     CARDIAC CATHETERIZATION Left 08/16/2016   Procedure: Left Heart Cath and Coronary Angiography;  Surgeon: Alwyn Pea, MD;  Location: [facility] INVASIVE CV LAB;  Service: Cardiovascular;  Laterality: Left;   COLON SURGERY     part of colon removed   COLONOSCOPY N/A 09/07/2020   Procedure: COLONOSCOPY;  Surgeon: Pasty Spillers, MD;  Location: [facility] ENDOSCOPY;  Service: Endoscopy;  Laterality: N/A;   COLONOSCOPY WITH PROPOFOL N/A 05/17/2021   Procedure: COLONOSCOPY WITH PROPOFOL;  Surgeon: Pasty Spillers, MD;  Location: [facility] ENDOSCOPY;  Service: Endoscopy;  Laterality: N/A;   CORONARY ARTERY BYPASS GRAFT  08/30/2016   triple   CORONARY STENT INTERVENTION N/A 06/09/2020   Procedure: CORONARY STENT INTERVENTION;  Surgeon: Alwyn Pea, MD;  Location: [facility] INVASIVE CV LAB;  Service: Cardiovascular;  Laterality: N/A;   ENDOSCOPIC MUCOSAL RESECTION N/A 09/22/2020   Procedure: ENDOSCOPIC MUCOSAL RESECTION;  Surgeon: Meridee Score Netty Starring., MD;  Location: [facility] ENDOSCOPY;  Service: Gastroenterology;  Laterality: N/A;   ESOPHAGOGASTRODUODENOSCOPY N/A 09/07/2020   Procedure: ESOPHAGOGASTRODUODENOSCOPY (EGD);  Surgeon: Pasty Spillers, MD;  Location: [facility] ENDOSCOPY;  Service: Endoscopy;  Laterality: N/A;   ESOPHAGOGASTRODUODENOSCOPY (EGD) WITH PROPOFOL N/A 09/22/2020   Procedure: ESOPHAGOGASTRODUODENOSCOPY (EGD) WITH PROPOFOL;  Surgeon: Meridee Score Netty Starring., MD;  Location: [facility] ENDOSCOPY;  Service: Gastroenterology;  Laterality: N/A;   FRACTURE SURGERY     HEMOSTASIS CLIP PLACEMENT  09/22/2020   Procedure: HEMOSTASIS CLIP PLACEMENT;  Surgeon: Lemar Lofty., MD;  Location: [facility] ENDOSCOPY;  Service: Gastroenterology;;   HEMOSTASIS CONTROL  09/22/2020   Procedure: HEMOSTASIS CONTROL;  Surgeon: Lemar Lofty., MD;  Location: [facility] ENDOSCOPY;  Service: Gastroenterology;;   LEFT HEART CATH AND CORONARY ANGIOGRAPHY Left 06/09/2020   Procedure: LEFT HEART CATH AND CORONARY ANGIOGRAPHY;  Surgeon: Alwyn Pea, MD;  Location: [facility] INVASIVE CV LAB;  Service: Cardiovascular;  Laterality:  Left;   LEFT HEART CATH AND CORS/GRAFTS ANGIOGRAPHY Left 11/20/2016   Procedure: Left Heart Cath and Cors/Grafts Angiography;  Surgeon: Alwyn Pea, MD;  Location: [facility] INVASIVE CV LAB;  Service: Cardiovascular;  Laterality: Left;   POLYPECTOMY  09/22/2020   Procedure: POLYPECTOMY;  Surgeon: Mansouraty, Netty Starring., MD;  Location: [facility] ENDOSCOPY;  Service: Gastroenterology;;   SUBMUCOSAL LIFTING INJECTION  09/22/2020   Procedure: SUBMUCOSAL LIFTING INJECTION;  Surgeon: Lemar Lofty., MD;  Location: [facility] ENDOSCOPY;  Service: Gastroenterology;;    Medications Prior to Admission  Medication Sig Dispense Refill Last Dose   amLODipine (NORVASC) 10 MG tablet Take 10 mg by mouth daily.   Past Week   aspirin EC 81 MG tablet Take 81 mg by mouth daily. Swallow whole.   Past Week   clopidogrel (PLAVIX) 75 MG tablet Take 75 mg by mouth daily.   Past Week   dexlansoprazole (DEXILANT) 60 MG capsule Take 1 capsule by mouth daily.   Past Week   hydrALAZINE (APRESOLINE) 50 MG tablet Take 1 tablet by mouth 3 (three) times daily.   Past Week   hydrochlorothiazide (HYDRODIURIL) 12.5 MG tablet Take 12.5 mg by mouth daily.   Past Week   insulin glargine (LANTUS) 100 UNIT/ML injection Inject 25 Units into the skin at bedtime.    Past Week   irbesartan (AVAPRO) 300 MG tablet Take 1 tablet (300 mg total) by mouth daily. 30 tablet 1 Past Week   labetalol (NORMODYNE) 100 MG tablet Take 1 tablet by mouth 2 (two) times daily.   Past Week   metFORMIN (GLUCOPHAGE-XR) 500 MG 24 hr tablet Take 500 mg by mouth 2 (two) times daily.   Past Week   rosuvastatin (CRESTOR) 40 MG tablet Take 40 mg by mouth daily.   Past Week   sitaGLIPtin (JANUVIA) 100 MG tablet Take 100 mg by mouth daily.   Past Week   Testosterone 1.62 % GEL Apply 2 Pump topically daily. One pump on each arm 225 g 3 Past Week   acetaminophen (TYLENOL) 325 MG tablet Take 2 tablets (650 mg total) by mouth every 6 (six) hours as needed for mild pain (or  Fever >/= 101). (Patient not taking: Reported on 08/21/2022)      nebivolol (BYSTOLIC) 10 MG tablet Take 10 mg by mouth daily. (Patient not taking: Reported on 12/20/2022)   Not Taking   torsemide (DEMADEX) 20 MG tablet Take 20 mg by mouth 2 (two) times daily.       Social History   Socioeconomic History   Marital status: Married    Spouse name: Not on file   Number of children: Not on file   Years of education: Not on file   Highest education level: Not on file  Occupational History   Not on file  Tobacco Use   Smoking status: Former    Types: Cigarettes   Smokeless tobacco: Never  Vaping Use   Vaping Use: Never used  Substance and Sexual Activity   Alcohol use: No   Drug use: No   Sexual activity: Not Currently  Other Topics Concern   Not on file  Social History Narrative   Not  on file   Social Determinants of Health   Financial Resource Strain: Not on file  Food Insecurity: Not on file  Transportation Needs: Not on file  Physical Activity: Not on file  Stress: Not on file  Social Connections: Not on file  Intimate Partner Violence: Not on file    History reviewed. No pertinent family history.    Intake/Output Summary (Last 24 hours) at 12/22/2022 0845 Last data filed at 12/22/2022 0703 Gross per 24 hour  Intake 904.35 ml  Output 4000 ml  Net -3095.65 ml    Vitals:   12/21/22 1949 12/21/22 2352 12/22/22 0354 12/22/22 0800  BP: (!) 174/79 (!) 173/83 133/71 (!) 158/75  Pulse: (!) 57 63 66 65  Resp: 18 20 18 17   Temp: 97.8 F (36.6 C) 98.5 F (36.9 C) 97.7 F (36.5 C) 98 F (36.7 C)  TempSrc: Oral Oral Oral   SpO2: 96% 98% 96% 95%  Weight:      Height:        PHYSICAL EXAM General: Pleasant ill-appearing, Caucasian male, upright in bed without family present HEENT:  Normocephalic and atraumatic. Neck:  + JVD.  Lungs: Normal respiratory effort on oxygen by nasal cannula crackles bilaterally to auscultation without appreciable wheezing.   Heart:  Irregularly irregular with controlled rate.  S1-S2 present 3/6 systolic murmur heard throughout Abdomen: Distended appearing.  Msk: Normal strength and tone for age. Extremities: Tense woody lower extremity lymphedema bilaterally from toes up to thighs with weeping from wound on his right lower leg. Generalized erythema to both lower legs. Neuro: Alert and oriented X 3. Psych:  Answers questions appropriately.   Labs: Basic Metabolic Panel: Recent Labs    12/21/22 0443 12/22/22 0454  NA 138 137  K 3.8 3.0*  CL 104 101  CO2 25 30  GLUCOSE 108* 97  BUN 23 25*  CREATININE 1.19 1.22  CALCIUM 10.2 9.9  MG 1.9 1.8   Liver Function Tests: Recent Labs    12/20/22 1258  AST 22  ALT 21  ALKPHOS 141*  BILITOT 1.5*  PROT 7.8  ALBUMIN 3.9   No results for input(s): "LIPASE", "AMYLASE" in the last 72 hours. CBC: Recent Labs    12/20/22 1258 12/21/22 0605 12/22/22 0454  WBC 6.5 7.2 4.3  NEUTROABS 5.3 6.2  --   HGB 11.0* 11.3* 9.5*  HCT 35.7* 36.1* 29.7*  MCV 94.9 94.3 92.8  PLT 141* 125* 105*   Cardiac Enzymes: Recent Labs    12/20/22 1258 12/20/22 1443  TROPONINIHS 12 14   BNP: Recent Labs    12/20/22 1258  BNP 1,180.2*   D-Dimer: No results for input(s): "DDIMER" in the last 72 hours. Hemoglobin A1C: Recent Labs    12/21/22 0605  HGBA1C 5.8*   Fasting Lipid Panel: Recent Labs    12/21/22 0443  CHOL 85  HDL 45  LDLCALC 29  TRIG 53  CHOLHDL 1.9   Thyroid Function Tests: No results for input(s): "TSH", "T4TOTAL", "T3FREE", "THYROIDAB" in the last 72 hours.  Invalid input(s): "FREET3" Anemia Panel: No results for input(s): "VITAMINB12", "FOLATE", "FERRITIN", "TIBC", "IRON", "RETICCTPCT" in the last 72 hours.   Radiology: US Venous Img Lower Bilateral  Result Date: 12/20/2022 CLINICAL DATA:  Bilateral lower extremity swelling and pain EXAM: BILATERAL LOWER EXTREMITY VENOUS DOPPLER ULTRASOUND TECHNIQUE: Gray-scale sonography with graded compression,  as well as color Doppler and duplex ultrasound were performed to evaluate the lower extremity deep venous systems from the level of the common femoral vein  and including the common femoral, femoral, profunda femoral, popliteal and calf veins including the posterior tibial, peroneal and gastrocnemius veins when visible. Spectral Doppler was utilized to evaluate flow at rest and with distal augmentation maneuvers in the common femoral, femoral and popliteal veins. COMPARISON:  None Available. FINDINGS: RIGHT LOWER EXTREMITY Common Femoral Vein: No evidence of thrombus. Normal compressibility, respiratory phasicity and response to augmentation. Saphenofemoral Junction: No evidence of thrombus. Normal compressibility and flow on color Doppler imaging. Profunda Femoral Vein: No evidence of thrombus. Normal compressibility and flow on color Doppler imaging. Femoral Vein: No evidence of thrombus. Normal compressibility, respiratory phasicity and response to augmentation. Popliteal Vein: No evidence of thrombus. Normal compressibility, respiratory phasicity and response to augmentation. Calf Veins: No evidence of thrombus. Normal compressibility and flow on color Doppler imaging. LEFT LOWER EXTREMITY Common Femoral Vein: No evidence of thrombus. Normal compressibility, respiratory phasicity and response to augmentation. Saphenofemoral Junction: No evidence of thrombus. Normal compressibility and flow on color Doppler imaging. Profunda Femoral Vein: No evidence of thrombus. Normal compressibility and flow on color Doppler imaging. Femoral Vein: No evidence of thrombus. Normal compressibility, respiratory phasicity and response to augmentation. Popliteal Vein: No evidence of thrombus. Normal compressibility, respiratory phasicity and response to augmentation. Calf Veins: No evidence of thrombus. Normal compressibility and flow on color Doppler imaging. Other Findings: Benign-appearing fatty replaced elongated lymph nodes  noted bilaterally. Extremity subcutaneous edema present bilaterally. IMPRESSION: No evidence of deep venous thrombosis in either lower extremity. Electronically Signed   By: Judie Petit.  Shick M.D.   On: 12/20/2022 14:38   DG Chest Portable 1 View  Result Date: 12/20/2022 CLINICAL DATA:  Shortness of breath EXAM: PORTABLE CHEST 1 VIEW COMPARISON:  X-ray 08/21/2020 FINDINGS: Underinflation with bilateral small pleural effusions, new from previous. Adjacent opacity. Vascular congestion. No pneumothorax. Status post median sternotomy. The heart is enlarged. Overlapping cardiac leads. IMPRESSION: Postop chest. Enlarged cardiopericardial silhouette with vascular congestion and small effusions. Adjacent lung opacities. Recommend follow-up Electronically Signed   By: Karen Kays M.D.   On: 12/20/2022 13:37    LHC 2021  LM lesion is 40% stenosed. Prox RCA lesion is 99% stenosed. Mid RCA lesion is 75% stenosed. Dist RCA lesion is 95% stenosed. RPDA lesion is 90% stenosed. SVG and is normal in caliber. The flow is not reversed. There is no competitive flow SVG and is normal in caliber. The flow is not reversed. There is no competitive flow Prox Graft lesion is 100% stenosed. LIMA and is normal in caliber. The flow is not reversed. There is no competitive flow Origin lesion is 95% stenosed. Previously placed Prox Cx to Dist Cx stent (unknown type) is widely patent. Dist Cx lesion is 90% stenosed. Previously placed Ost LAD to Mid LAD stent (unknown type) is widely patent. Origin to Prox Graft lesion is 100% stenosed. A drug-eluting stent was successfully placed using a STENT RESOLUTE ONYX 2.5X18. Post intervention, there is a 0% residual stenosis. Ost Cx lesion is 70% stenosed. Dist LAD-1 lesion is 50% stenosed. Previously placed Dist LAD-2 stent (unknown type) is widely patent. Ost LAD lesion is 75% stenosed. Preserved left ventricular function ejection fraction of 60%   Conclusion Normal left  ventricular function 60% Coronaries Left main large calcified moderate lesion moderate to large calcium Distal LAD large ostial lesion 75% moderate calcification proximal LAD stent widely patent mid LAD stent widely patent distal LAD diffuse 50% Circumflex is large 70% ostial mid stent widely patent distal circumflex 95% TIMI-3 flow RCA medium proximal  90% lesion 90% mid distal 75 Grafts LIMA to LAD incompletely evaluated probably occluded SVG to circumflex 100% occluded SVG to PDA 95% ostial in-stent TIMI-3 flow Intervention Successful PCI and stent to in-stent restenosis ostially with 2.5 x 18 mm resolute Onyx to 20 atm Lesion reduced from 95 down to 0% TIMI-3 flow maintained throughout   Successful PCI and stent ostial SVG in-stent restenosis. Lesion reduced from 95 down to 0%  ECHO  NORMAL LEFT VENTRICULAR SYSTOLIC FUNCTION   WITH SEVERE LVH  NORMAL RIGHT VENTRICULAR SYSTOLIC FUNCTION  MODERATE VALVULAR REGURGITATION (See above)  MILD VALVULAR STENOSIS (See above)  IRREGULAR HEART RHYTHM CAPTURED THROUGHOUT EXAM  ESTIMATED LVEF >55%  MODERATE AI  MILD AS; Max Velocity: 3.79m/s; AVA: 1.1cm^2  (PREV. ECHO: 2.47m/s; AVA: 1.9cm^2)  MODERATE - SEVERE MR (MVP: MILD - AMVL)  MODERATE TR; SEVERE PHTN  SEVERE LAE; MILD RAE  MILDLY DILATED ASCENDING AORTA MEASURING UP TO 3.7cm  PATIENT IMAGED SUPINE FROM APICALS TO PEDOFF TO SUBCOSTAL; PATIENT HAD DIFFICULTY LAYING  DOWN; FURTHER EVALUATION DID NOT SHOW ANY SIGNS OF TAMPONADE  _________________________________________________________________________________________  Electronically signed by    Dorothyann Peng, MD on 09/28/2022 07: 54 AM           Performed By: Verdis Prime     Ordering Physician: Dorothyann Peng, MD   TELEMETRY reviewed by me [facility]) 12/22/2022 : AF rate 50s with overnight pauses  EKG reviewed by me: AF RBBB and LAFB rate 70  Data reviewed by me [facility]) 12/22/2022: Hospitalist progress note, nursing notes last 24h vitals  tele labs imaging I/O    Principal Problem:   Acute heart failure with preserved ejection fraction (HFpEF) (HCC) Active Problems:   Insulin dependent type 2 diabetes mellitus (HCC)   Essential hypertension   OSA (obstructive sleep apnea)   CAD (coronary artery disease), native coronary artery   Chronic venous stasis   Paroxysmal atrial fibrillation (HCC)    ASSESSMENT AND PLAN:  Normand Sloop P. Jacka is a 48yoM with a PMH of CAD s/p CABG (LIMA-LAD, SVG - LCX 100% occluded, SVG - PDA s/p PCI (2021) with prior stents, paroxysmal AF (not anticoagulated), HFpEF (severe LVH, mild AS, moderate AI, moderate to severe MR, moderate TR by echo 09/2022) DM2, CKD, OSA, who presented to [facility] for an outpatient left heart cath with Dr. Juliann Pares the morning of 5/9.  He was orthopneic with significant peripheral edema and was ultimately unable to lay flat for the procedure. He was referred to [facility] ED for further evaluation and management of acute on chronic heart failure.  # Acute on chronic HFpEF # Moderate to severe MR # Moderate AI Presents with 2 months of progressively worsening shortness of breath and exertional chest pain for which LHC was initially recommended by Dr. Juliann Pares.  Over the past 2 weeks his peripheral edema, orthopnea, and dyspnea have significantly worsened, refractory to outpatient torsemide.  He remains massively volume overloaded on exam with tense woody edema with weeping from wounds on his legs, with good initial diuresis.  Remains orthopneic with a supplemental oxygen requirement.  Anticipate need for at least several additional days of IV diuresis -Continue Lasix infusion at 10 mg/h. Cr 1.22 today from 1.19 yesterday, will continue to monitor.  -Will give K and mag today. Monitor and replenish electrolytes for a goal K >4, mag >2 -Continue Nitropaste 1 inch every 6 hours to assist with vasodilation and BP control -Continue metoprolol tartrate 25 mg twice daily with hold parameters for  HR <60 -  Continue hydralazine 50 mg 3 times daily -Increase spironolactone to 25 mg daily starting tomorrow -Further additions of ARB, SGLT2i pending stability of his renal function. -Echo complete pending  # CAD s/p CABG and multiple prior PCI's Chest pain-free at rest, EKGs nonacute, troponins negative.  Plan to continue aspirin and statin.   No immediate plan for invasive cardiac diagnostics, but may consider LHC before discharge once volume status improves.  Continue management of his heart failure as above  # Paroxysmal atrial fibrillation # Pauses Slow rate on telemetry today, was not on anticoagulation prior to this admission for unknown reasons.  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   -Pauses overnight noted on tele, will continue to monitor. Patient asymptomatic -Will continue metoprolol for rate control and heparin for anticoagulation.   -After discussion of risks/benefits on 5/10, patient is agreeable to starting a DOAC at discharge with close monitoring for bleeding side effects and H&H. CHA2DS2-VASc 5 (age, CHF, HTN, CAD)   This patient's plan of care was discussed and created with Dr. Darrold Junker and he is in agreement.  Signed: Gale Journey , PA-C 12/22/2022, 8:45 AM [facility] Card[facility]

## 2022-12-22 NOTE — Consult Note (Signed)
ANTICOAGULATION CONSULT NOTE  Pharmacy Consult for heparin infusion Indication: atrial fibrillation  Allergies  Allergen Reactions   Isosorbide Cough    Patient Measurements: Height: 6\' 1"  (185.4 cm) Weight: 111.6 kg (246 lb 0.5 oz) IBW/kg (Calculated) : 79.9 Heparin Dosing Weight: 103.5 kg  Vital Signs: Temp: 98.5 F (36.9 C) (05/10 2352) Temp Source: Oral (05/10 2352) BP: 173/83 (05/10 2352) Pulse Rate: 63 (05/10 2352)  Labs: Recent Labs    12/20/22 1258 12/20/22 1308 12/20/22 1443 12/21/22 0443 12/21/22 0605 12/21/22 1355 12/22/22 0001  HGB 11.0*  --   --   --  11.3*  --   --   HCT 35.7*  --   --   --  36.1*  --   --   PLT 141*  --   --   --  125*  --   --   APTT  --  35  --   --   --   --   --   LABPROT  --  16.0*  --   --   --   --   --   INR  --  1.3*  --   --   --   --   --   HEPARINUNFRC  --   --   --  <0.10*  --  0.22* 0.27*  CREATININE 1.11  --   --  1.19  --   --   --   TROPONINIHS 12  --  14  --   --   --   --      Estimated Creatinine Clearance: 70.2 mL/min (by C-G formula based on SCr of 1.19 mg/dL).   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  EKG was found to be concerning for Afib.  Pharmacy consulted to initiate heparin infusion.  Date Time aPTT/HL Rate/Comment 5/10 0443 <0.10  Subtherapeutic 5/10 1355 0.22  Subtherapeutic  5/11     0001    0.27                 Subtherapeutic      Goal  of Therapy:  Heparin level 0.3-0.7 units/ml Monitor platelets by anticoagulation protocol: Yes   Plan:  Bolus heparin 1550 units x 1 and increase drip rate to 2300 un/hr HL check in 8 hrs CBC daily while on heparin  Kamen Hanken D 12/22/2022 1:01 AM

## 2022-12-22 NOTE — TOC Initial Note (Signed)
Transition of Care [facility]) - Initial/Assessment Note    Patient Details  Name: [name] MRN: [id number] Date of Birth: October 26, 1945  Transition of Care [facility]) CM/SW Contact:    Kemper Durie, RN Phone Number: 12/22/2022, 12:10 PM  Clinical Narrative:                  Patient admitted from home, lives with wife.  Dr. Arlana Pouch is PCP, uses [facility] for medication, if short term medication, uses [facility].  Denies having or needing DME currently.  Aware that recommendation is for outpatient PT, state he would like to discuss further with wife and MD prior to saying yes.  Report he would like to use [facility] should he decide to participate, aware to inform PCP for orders if he agrees.   Expected Discharge Plan: OP Rehab Barriers to Discharge: Continued Medical Work up   Patient Goals and CMS Choice Patient states their goals for this hospitalization and ongoing recovery are:: Home, will do outpatient PT   Choice offered to / list presented to : Patient      Expected Discharge Plan and Services       Living arrangements for the past 2 months: Single Family Home                                      Prior Living Arrangements/Services Living arrangements for the past 2 months: Single Family Home Lives with:: Spouse Patient language and need for interpreter reviewed:: Yes        Need for Family Participation in Patient Care: No (Comment)     Criminal Activity/Legal Involvement Pertinent to Current Situation/Hospitalization: No - Comment as needed  Activities of Daily Living      Permission Sought/Granted                  Emotional Assessment   Attitude/Demeanor/Rapport: Charismatic, Engaged Affect (typically observed): Calm Orientation: : Oriented to Self, Oriented to Place, Oriented to  Time, Oriented to Situation   Psych Involvement: No (comment)  Admission diagnosis:  Acute respiratory failure with hypoxia (HCC) [J96.01] Acute on chronic  congestive heart failure, unspecified heart failure type (HCC) [I50.9] Acute heart failure with preserved ejection fraction (HFpEF) (HCC) [I50.31] Patient Active Problem List   Diagnosis Date Noted   Acute heart failure with preserved ejection fraction (HFpEF) (HCC) 12/20/2022   Chronic venous stasis 12/20/2022   Paroxysmal atrial fibrillation (HCC) 12/20/2022   Iron deficiency anemia 05/17/2021   Thyroid nodule 11/04/2020   Lymphedema 10/27/2020   Gastric mass    Polyp of sigmoid colon    Acute blood loss anemia 09/04/2020   COVID-19 virus infection    Anemia due to vitamin B12 deficiency    Iron deficiency anemia due to chronic blood loss    Hypercalcemia    Pica    Acute on chronic blood loss anemia 09/02/2020   Hyperlipidemia associated with type 2 diabetes mellitus (HCC) 09/02/2020   S/P drug eluting coronary stent placement 06/09/2020   Other hyperlipidemia 08/18/2019   Insulin dependent type 2 diabetes mellitus (HCC) 01/14/2017   Essential hypertension 01/14/2017   OSA (obstructive sleep apnea) 01/14/2017   At risk for fluid volume overload 09/08/2016   BBB (bundle branch block) 08/30/2016   Obesity with body mass index (BMI) of 30.0 to 39.9 08/30/2016   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   1st degree AV block 08/30/2016  CAD (coronary artery disease), native coronary artery 08/20/2016   History of colonic polyps 11/05/2013   PCP:  Jaclyn Shaggy, MD Pharmacy:   [facility] [phone] [place], [place] - [address] [place] [address] Phone: [phone] Fax: [phone]  [facility] - [place], [place] - [address] [place] [address] Phone: [phone] Fax: [phone]  [id number] [place], [place] - [address] [place] [address] Phone: [phone] Fax: [phone]  [facility] - [place], [place] - [address] [place] [address] Phone: [phone] Fax: [phone]     Social Determinants of Health (SDOH) Social History: SDOH Screenings   Tobacco Use: Medium Risk (12/20/2022)   SDOH Interventions:     Readmission Risk Interventions     No data to display

## 2022-12-22 NOTE — Progress Notes (Signed)
PROGRESS NOTE    [name]  ZOX:[id number] DOB: 1946-02-29 DOA: 12/20/2022 PCP: Jaclyn Shaggy, MD  250A/250A-AA  LOS: 2 days   Brief hospital course:   Assessment & Plan: [name] is a 77 y.o. male with medical history significant of CAD s/p CABG and DES, severe pulmonary hypertension, hypertension, hyperlipidemia, OSA, type 2 diabetes, hypothyroidism, who presents to the ED due to shortness of breath.     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  He had initially come to the hospital for a left heart cath but was unable to lay flat.  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  Cardiology has been consulted and initiated Lasix drip. Plan: --cont lasix gtt@10  --cont Nitropaste 1 inch every 6 hours to assist with vasodilation and BP control -cont metoprolol tartrate 25 mg twice daily (new) -Continue hydralazine 50 mg 3 times daily -cont spironolactone 12.5 mg daily (new)  Acute hypoxemic respiratory failure --O2 sats 86% on RA, placed on 2L [place]. --Continue supplemental O2 to keep sats >=90%, wean as tolerated  # CAD s/p CABG and multiple prior PCI's  History of stable angina with plan for LHC today, however patient was unable to lay flat.  --Chest pain-free at rest, EKGs nonacute, troponins negative.  --cont plavix and statin  Paroxysmal atrial fibrillation (HCC) Per chart review, patient has had atrial fibrillation for a while now, however does not appear that anticoagulation has been initiated.  CHA2DS2-VASc is elevated at 6.  No contraindications at this time.  Started on heparin gtt by cardio Plan: --cont heparin gtt for stroke ppx, per cardio --cont metop (new)  Chronic venous  stasis Bilateral woody appearance of lower extremities consistent with chronic venous stasis.  Patient states that lower extremity edema below the knees is unchanged compared to prior and has been this way for years, however I do suspect that they are slightly worsened given significant hypervolemia   --apply "dry boot" by RN after discussing with wound care RN  OSA (obstructive sleep apnea) - CPAP at bedtime  Essential hypertension Since admission, patient has been started on Lasix infusion, metoprolol and nitroglycerin.  At home, he was previously on amlodipine, hydralazine, HCTZ, irbesartan, labetalol.  He remains hypertensive above goal at this time.  --cont hydralazine, Lopressor  --cont nitro paste, per cardio --cont aldactone (new)  Insulin dependent type 2 diabetes mellitus (HCC) --A1c 5.8, well controlled - SSI, moderate --reduce glargine to 10u nightly   DVT prophylaxis: [id number] gtt Code Status: Full code  Family Communication: son updated at bedside today Level of care: Telemetry Cardiac Dispo:   The patient is from: home Anticipated d/c is to: home Anticipated d/c date is: 2-3 days   Subjective and Interval History:  Pt reported continued good urine output.   Objective: Vitals:   12/21/22 2352 12/22/22 0354 12/22/22 0800 12/22/22 1621  BP: (!) 173/83 133/71 (!) 158/75 (!) 142/72  Pulse: 63 66 65 63  Resp: 20 18 17 18   Temp: 98.5 F (36.9 C) 97.7 F (36.5 C) 98 F (36.7 C) 98.5 F (36.9 C)  TempSrc: Oral Oral  Oral  SpO2: 98% 96% 95% 99%  Weight:      Height:        Intake/Output Summary (Last 24 hours) at 12/22/2022 1819  Last data filed at 12/22/2022 1628 Gross per 24 hour  Intake 1379.18 ml  Output 3500 ml  Net -2120.82 ml   Filed Weights   12/20/22 1249 12/21/22 0534  Weight: 111.8 kg 111.6 kg    Examination:   Constitutional: NAD, AAOx3 HEENT: conjunctivae and lids normal, EOMI CV: No cyanosis.   RESP: normal respiratory  effort Extremities: both legs wrapped in gauze and ACE Neuro: II - XII grossly intact.   Psych: Normal mood and affect.  Appropriate judgement and reason   Data Reviewed: I have personally reviewed labs and imaging studies  Time spent: 35 minutes  Darlin Priestly, MD [facility] If 7PM-7AM, please contact night-coverage 12/22/2022, 6:19 PM

## 2022-12-22 NOTE — Consult Note (Signed)
ANTICOAGULATION CONSULT NOTE  Pharmacy Consult for heparin infusion Indication: atrial fibrillation  Allergies  Allergen Reactions   Isosorbide Cough    Patient Measurements: Height: 6\' 1"  (185.4 cm) Weight: 111.6 kg (246 lb 0.5 oz) IBW/kg (Calculated) : 79.9 Heparin Dosing Weight: 103.5 kg  Vital Signs: Temp: 98 F (36.7 C) (05/11 0800) Temp Source: Oral (05/11 0354) BP: 158/75 (05/11 0800) Pulse Rate: 65 (05/11 0800)  Labs: Recent Labs    12/20/22 1258 12/20/22 1258 12/20/22 1308 12/20/22 1443 12/21/22 0443 12/21/22 0605 12/21/22 1355 12/22/22 0001 12/22/22 0454 12/22/22 0855  HGB 11.0*  --   --   --   --  11.3*  --   --  9.5*  --   HCT 35.7*  --   --   --   --  36.1*  --   --  29.7*  --   PLT 141*  --   --   --   --  125*  --   --  105*  --   APTT  --   --  35  --   --   --   --   --   --   --   LABPROT  --   --  16.0*  --   --   --   --   --   --   --   INR  --   --  1.3*  --   --   --   --   --   --   --   HEPARINUNFRC  --    < >  --   --  <0.10*  --  0.22* 0.27*  --  0.46  CREATININE 1.11  --   --   --  1.19  --   --   --  1.22  --   TROPONINIHS 12  --   --  14  --   --   --   --   --   --    < > = values in this interval not displayed.     Estimated Creatinine Clearance: 68.5 mL/min (by C-G formula based on SCr of 1.22 mg/dL).   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  EKG was found to be  concerning for Afib.  Pharmacy consulted to initiate heparin infusion.  Date Time aPTT/HL Rate/Comment 5/10 0443 <0.10  Subtherapeutic 5/10 1355 0.22  Subtherapeutic  5/11     0001    0.27                 Subtherapeutic  5/11 0855 0.46  Therapeutic x1    Goal of Therapy:  Heparin level 0.3-0.7 units/ml Monitor platelets by anticoagulation protocol: Yes   Plan:  5/11  0855  HL=0.46  Therapeutic x1 Continue heparin drip rate at 2300 un/hr Check confirmatory HL in 8 hrs Cardiology recommends starting a DOAC at discharge CBC daily while on heparin  Minerva Bluett A 12/22/2022 10:58 AM

## 2022-12-22 NOTE — Consult Note (Signed)
ANTICOAGULATION CONSULT NOTE  Pharmacy Consult for heparin infusion Indication: atrial fibrillation  Allergies  Allergen Reactions   Isosorbide Cough    Patient Measurements: Height: 6\' 1"  (185.4 cm) Weight: 111.6 kg (246 lb 0.5 oz) IBW/kg (Calculated) : 79.9 Heparin Dosing Weight: 103.5 kg  Vital Signs: Temp: 98.5 F (36.9 C) (05/11 1621) Temp Source: Oral (05/11 1621) BP: 142/72 (05/11 1621) Pulse Rate: 63 (05/11 1621)  Labs: Recent Labs    12/20/22 1258 12/20/22 1308 12/20/22 1443 12/21/22 0443 12/21/22 0605 12/21/22 1355 12/22/22 0001 12/22/22 0454 12/22/22 0855 12/22/22 1657  HGB 11.0*  --   --   --  11.3*  --   --  9.5*  --   --   HCT 35.7*  --   --   --  36.1*  --   --  29.7*  --   --   PLT 141*  --   --   --  125*  --   --  105*  --   --   APTT  --  35  --   --   --   --   --   --   --   --   LABPROT  --  16.0*  --   --   --   --   --   --   --   --   INR  --  1.3*  --   --   --   --   --   --   --   --   HEPARINUNFRC  --   --   --  <0.10*  --    < > 0.27*  --  0.46 0.41  CREATININE 1.11  --   --  1.19  --   --   --  1.22  --   --   TROPONINIHS 12  --  14  --   --   --   --   --   --   --    < > = values in this interval not displayed.     Estimated Creatinine Clearance: 68.5 mL/min (by C-G formula based on SCr of 1.22 mg/dL).   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  EKG was found to be  concerning for Afib.  Pharmacy consulted to initiate heparin infusion.  Date Time aPTT/HL Rate/Comment 5/10 0443 <0.10  Subtherapeutic 5/10 1355 0.22  Subtherapeutic  5/11     0001    0.27                 Subtherapeutic  5/11 0855 0.46  Therapeutic x1 5/11 1657 0.41  Therapeutic x 2    Goal of Therapy:  Heparin level 0.3-0.7 units/ml Monitor platelets by anticoagulation protocol: Yes   Plan:  HL therapeutic x 2 Continue heparin drip rate at 2300 un/hr Check heparin level daily with AM labs Cardiology recommends starting a DOAC at discharge CBC daily while on heparin  Barrie Folk, PharmD 12/22/2022 5:25 PM

## 2022-12-23 ENCOUNTER — Inpatient Hospital Stay
Admit: 2022-12-23 | Discharge: 2022-12-23 | Disposition: A | Discharge status: Home / Self Care | Payer: Medicare HMO | Attending: Cardiology | Admitting: Cardiology

## 2022-12-23 DIAGNOSIS — I5031 Acute diastolic (congestive) heart failure: Secondary | ICD-10-CM | POA: Diagnosis not present

## 2022-12-23 LAB — ECHOCARDIOGRAM COMPLETE
AR max vel: 1.53 cm2
AV Area VTI: 1.63 cm2
AV Area mean vel: 1.55 cm2
AV Mean grad: 15 mmHg
AV Peak grad: 25.6 mmHg
Ao pk vel: 2.53 m/s
Area-P 1/2: 4.06 cm2
Height: 73 in
MV VTI: 2.69 cm2
S' Lateral: 2.3 cm
Weight: 3529.6 oz

## 2022-12-23 LAB — BASIC METABOLIC PANEL
Anion gap: 8 (ref 5–15)
BUN: 27 mg/dL — ABNORMAL HIGH (ref 8–23)
CO2: 32 mmol/L (ref 22–32)
Calcium: 10.2 mg/dL (ref 8.9–10.3)
Chloride: 95 mmol/L — ABNORMAL LOW (ref 98–111)
Creatinine, Ser: 1.4 mg/dL — ABNORMAL HIGH (ref 0.61–1.24)
GFR, Estimated: 52 mL/min — ABNORMAL LOW (ref >60)
Glucose, Bld: 146 mg/dL — ABNORMAL HIGH (ref 70–99)
Potassium: 3.4 mmol/L — ABNORMAL LOW (ref 3.5–5.1)
Sodium: 135 mmol/L (ref 135–145)

## 2022-12-23 LAB — CBC
HCT: 33 % — ABNORMAL LOW (ref 39.0–52.0)
Hemoglobin: 10.5 g/dL — ABNORMAL LOW (ref 13.0–17.0)
MCH: 29.5 pg (ref 26.0–34.0)
MCHC: 31.8 g/dL (ref 30.0–36.0)
MCV: 92.7 fL (ref 80.0–100.0)
Platelets: 131 10*3/uL — ABNORMAL LOW (ref 150–400)
RBC: 3.56 MIL/uL — ABNORMAL LOW (ref 4.22–5.81)
RDW: 17 % — ABNORMAL HIGH (ref 11.5–15.5)
WBC: 5.5 10*3/uL (ref 4.0–10.5)
nRBC: 0 % (ref 0.0–0.2)

## 2022-12-23 LAB — GLUCOSE, CAPILLARY
Glucose-Capillary: 126 mg/dL — ABNORMAL HIGH (ref 70–99)
Glucose-Capillary: 192 mg/dL — ABNORMAL HIGH (ref 70–99)
Glucose-Capillary: 139 mg/dL — ABNORMAL HIGH (ref 70–99)
Glucose-Capillary: 221 mg/dL — ABNORMAL HIGH (ref 70–99)

## 2022-12-23 LAB — HEPARIN LEVEL (UNFRACTIONATED): Heparin Unfractionated: 0.44 IU/mL (ref 0.30–0.70)

## 2022-12-23 LAB — MAGNESIUM: Magnesium: 1.9 mg/dL (ref 1.7–2.4)

## 2022-12-23 MED ORDER — POTASSIUM CHLORIDE CRYS ER 20 MEQ PO TBCR
40.0000 meq | EXTENDED_RELEASE_TABLET | Freq: Two times a day (BID) | ORAL | Status: DC
  Administered 2022-12-23 – 2022-12-25 (×6): 40 meq via ORAL
  Filled 2022-12-23 (×7): qty 2

## 2022-12-23 MED ORDER — APIXABAN 5 MG PO TABS
5.0000 mg | ORAL_TABLET | Freq: Two times a day (BID) | ORAL | Status: DC
  Administered 2022-12-23 – 2022-12-27 (×8): 5 mg via ORAL
  Filled 2022-12-23 (×8): qty 1

## 2022-12-23 MED ORDER — BACITRACIN ZINC 500 UNIT/GM EX OINT
TOPICAL_OINTMENT | Freq: Two times a day (BID) | CUTANEOUS | Status: DC
  Administered 2022-12-25 – 2022-12-27 (×3): 1 via TOPICAL
  Filled 2022-12-23 (×7): qty 0.9

## 2022-12-23 NOTE — Progress Notes (Signed)
*  PRELIMINARY RESULTS* Echocardiogram 2D Echocardiogram has been performed.  Cristela Blue 12/23/2022, 1:20 PM

## 2022-12-23 NOTE — Progress Notes (Signed)
PROGRESS NOTE    [name]  WGN:[id number] DOB: 04-Feb-1946 DOA: 12/20/2022 PCP: Jaclyn Shaggy, MD  250A/250A-AA  LOS: 3 days   Brief hospital course:   Assessment & Plan: [name] is a 77 y.o. male with medical history significant of CAD s/p CABG and DES, severe pulmonary hypertension, hypertension, hyperlipidemia, OSA, type 2 diabetes, hypothyroidism, who presents to the ED due to shortness of breath.     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  He had initially come to the hospital for a left heart cath but was unable to lay flat.  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  Cardiology has been consulted and initiated Lasix drip. Plan: --cont lasix gtt@10  --cont Nitropaste 1 inch every 6 hours to assist with vasodilation and BP control -cont metoprolol tartrate 25 mg twice daily (new) -Continue hydralazine 50 mg 3 times daily --increase spironolactone to 25 mg daily (new)  Acute hypoxemic respiratory failure --O2 sats 86% on RA, placed on 2L [place]. --weaned to RA today  # CAD s/p CABG and multiple prior PCI's  History of stable angina with plan for LHC today, however patient was unable to lay flat.  --Chest pain-free at rest, EKGs nonacute, troponins negative.  --cont plavix and statin  Paroxysmal atrial fibrillation (HCC) Per chart review, patient has had atrial fibrillation for a while now, however does not appear that anticoagulation has been initiated.  CHA2DS2-VASc is elevated at 6.  No contraindications at this time.  Started on heparin gtt by cardio Plan: --transition from heparin gtt to Eliquis today for stroke ppx --cont metop (new)  Chronic venous stasis Bilateral woody  appearance of lower extremities consistent with chronic venous stasis.  Patient states that lower extremity edema below the knees is unchanged compared to prior and has been this way for years, however I do suspect that they are slightly worsened given significant hypervolemia   --apply "dry boot" by RN after discussing with wound care RN --dressing change daily per order  OSA (obstructive sleep apnea) - CPAP at bedtime  Essential hypertension Since admission, patient has been started on Lasix infusion, metoprolol and nitroglycerin.  At home, he was previously on amlodipine, hydralazine, HCTZ, irbesartan, labetalol.  He remains hypertensive above goal at this time.  --cont hydralazine, Lopressor  --cont nitro paste, per cardio --cont aldactone (new)  Insulin dependent type 2 diabetes mellitus (HCC) --A1c 5.8, well controlled - SSI, moderate --cont glargine 10u nightly  Non-healing wound of left ear --need outpatient dermatology eval   DVT prophylaxis: HQ:[id number] Code Status: Full code  Family Communication: son updated at bedside today Level of care: Telemetry Cardiac Dispo:   The patient is from: home Anticipated d/c is to: home Anticipated d/c date is: 2-3 days   Subjective and Interval History:  Continued to have good urine output.  Weaned down to RA today.   Objective: Vitals:   12/23/22 0800 12/23/22 0920 12/23/22 1140 12/23/22 1538  BP: (!) 163/87  (!) 147/68 (!) 156/81  Pulse: 62  (!) 49 (!) 54  Resp: 17  16 16   Temp: 98.8 F (37.1 C)  98.2 F (36.8 C) 98 F (36.7 C)  TempSrc:      SpO2: 95%  97% 100%  Weight:  100.1 kg  Height:        Intake/Output Summary (Last 24 hours) at 12/23/2022 1736 Last data filed at 12/23/2022 1547 Gross per 24 hour  Intake 1500 ml  Output 4325 ml  Net -2825 ml   Filed Weights   12/20/22 1249 12/21/22 0534 12/23/22 0920  Weight: 111.8 kg 111.6 kg 100.1 kg    Examination:   Constitutional: NAD, AAOx3 HEENT:  conjunctivae and lids normal, EOMI CV: No cyanosis.   RESP: normal respiratory effort, on RA Extremities: both legs wrapped with gauze and ACE SKIN: warm, dry.  Non-healing wound with black scab over left ear Neuro: II - XII grossly intact.   Psych: Normal mood and affect.  Appropriate judgement and reason   Data Reviewed: I have personally reviewed labs and imaging studies  Time spent: 35 minutes  Darlin Priestly, MD [facility] If 7PM-7AM, please contact night-coverage 12/23/2022, 5:36 PM

## 2022-12-23 NOTE — Consult Note (Signed)
ANTICOAGULATION CONSULT NOTE  Pharmacy Consult for heparin infusion Indication: atrial fibrillation  Allergies  Allergen Reactions   Isosorbide Cough    Patient Measurements: Height: 6\' 1"  (185.4 cm) Weight: 111.6 kg (246 lb 0.5 oz) IBW/kg (Calculated) : 79.9 Heparin Dosing Weight: 103.5 kg  Vital Signs: Temp: 99.7 F (37.6 C) (05/12 0400) Temp Source: Oral (05/12 0400) BP: 161/81 (05/12 0400) Pulse Rate: 60 (05/12 0400)  Labs: Recent Labs    12/20/22 1258 12/20/22 1308 12/20/22 1443 12/21/22 0443 12/21/22 0605 12/21/22 1355 12/22/22 0454 12/22/22 0855 12/22/22 1657 12/23/22 0604  HGB 11.0*  --   --   --  11.3*  --  9.5*  --   --  10.5*  HCT 35.7*  --   --   --  36.1*  --  29.7*  --   --  33.0*  PLT 141*  --   --   --  125*  --  105*  --   --  131*  APTT  --  35  --   --   --   --   --   --   --   --   LABPROT  --  16.0*  --   --   --   --   --   --   --   --   INR  --  1.3*  --   --   --   --   --   --   --   --   HEPARINUNFRC  --   --   --  <0.10*  --    < >  --  0.46 0.41 0.44  CREATININE 1.11  --   --  1.19  --   --  1.22  --   --   --   TROPONINIHS 12  --  14  --   --   --   --   --   --   --    < > = values in this interval not displayed.     Estimated Creatinine Clearance: 68.5 mL/min (by C-G formula based on SCr of 1.22 mg/dL).   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  EKG was found to be  concerning for Afib.  Pharmacy consulted to initiate heparin infusion.  Date Time aPTT/HL Rate/Comment 5/10 0443 <0.10  Subtherapeutic 5/10 1355 0.22  Subtherapeutic  5/11     0001    0.27                 Subtherapeutic  5/11 0855 0.46  Therapeutic x1 5/11 1657 0.41  Therapeutic x 2 5/12     0604    0.44                Therapeutic X  3     Goal of Therapy:  Heparin level 0.3-0.7 units/ml Monitor platelets by anticoagulation protocol: Yes   Plan:  HL therapeutic x 3 Continue heparin drip rate at 2300 un/hr Check heparin level daily with AM labs Cardiology recommends starting a DOAC at discharge CBC daily while on heparin  Oluwatosin Bracy D, PharmD 12/23/2022 6:42 AM

## 2022-12-23 NOTE — Progress Notes (Signed)
[facility] CLINIC CARDIOLOGY CONSULT NOTE       Patient ID: [name] MRN: [id number] DOB/AGE: April 06, 1946 77 y.o.  Admit date: 12/20/2022 Referring Physician Dr. Artis Delay Primary Physician  Primary Cardiologist Dr. Juliann Pares Reason for Consultation AoCHF  HPI: [name] is a 40yoM with a PMH of CAD s/p CABG (LIMA-LAD, SVG - LCX 100% occluded, SVG - PDA s/p PCI (2021) with prior stents, paroxysmal AF (not anticoagulated), HFpEF (severe LVH, mild AS, moderate AI, moderate to severe MR, moderate TR by echo 09/2022) DM2, CKD, OSA, who presented to [facility] for an outpatient left heart cath with Dr. Juliann Pares the morning of 5/9.  He was orthopneic with significant peripheral edema and was ultimately unable to lay flat for the procedure. He was referred to [facility] ED for further evaluation and management of acute on chronic heart failure.  Interval history: -Continues to diurese well with Lasix infusion, net negative 7.2L. Down 11 kg since 5/9. Slight bump in Cr to 1.4 today from 1.22 yesterday. -Weaned to room air today.  -Atrial fibrillation with rates in 50-60s this AM, continues to have <3 sec pauses overnight. Pt asymptomatic.  Review of systems complete and found to be negative unless listed above     Past Medical History:  Diagnosis Date   Anemia    Arthritis    Atrial fibrillation (HCC)    AV block, 1st degree    Bundle branch block    Chronic kidney disease    patient not aware of this dx   Coronary artery disease    Diabetes mellitus without complication (HCC)    type 2   Dyspnea    GERD (gastroesophageal reflux disease)    History of blood transfusion    Hypertension    Sleep apnea    does not use cpap    Past Surgical History:  Procedure Laterality Date   APPENDECTOMY     CARDIAC CATHETERIZATION Left 08/16/2016   Procedure: Left Heart Cath and Coronary Angiography;  Surgeon: Alwyn Pea, MD;  Location: [facility] INVASIVE CV LAB;  Service: Cardiovascular;   Laterality: Left;   COLON SURGERY     part of colon removed   COLONOSCOPY N/A 09/07/2020   Procedure: COLONOSCOPY;  Surgeon: Pasty Spillers, MD;  Location: [facility] ENDOSCOPY;  Service: Endoscopy;  Laterality: N/A;   COLONOSCOPY WITH PROPOFOL N/A 05/17/2021   Procedure: COLONOSCOPY WITH PROPOFOL;  Surgeon: Pasty Spillers, MD;  Location: [facility] ENDOSCOPY;  Service: Endoscopy;  Laterality: N/A;   CORONARY ARTERY BYPASS GRAFT  08/30/2016   triple   CORONARY STENT INTERVENTION N/A 06/09/2020   Procedure: CORONARY STENT INTERVENTION;  Surgeon: Alwyn Pea, MD;  Location: [facility] INVASIVE CV LAB;  Service: Cardiovascular;  Laterality: N/A;   ENDOSCOPIC MUCOSAL RESECTION N/A 09/22/2020   Procedure: ENDOSCOPIC MUCOSAL RESECTION;  Surgeon: Meridee Score Netty Starring., MD;  Location: [facility] ENDOSCOPY;  Service: Gastroenterology;  Laterality: N/A;   ESOPHAGOGASTRODUODENOSCOPY N/A 09/07/2020   Procedure: ESOPHAGOGASTRODUODENOSCOPY (EGD);  Surgeon: Pasty Spillers, MD;  Location: [facility] ENDOSCOPY;  Service: Endoscopy;  Laterality: N/A;   ESOPHAGOGASTRODUODENOSCOPY (EGD) WITH PROPOFOL N/A 09/22/2020   Procedure: ESOPHAGOGASTRODUODENOSCOPY (EGD) WITH PROPOFOL;  Surgeon: Meridee Score Netty Starring., MD;  Location: [facility] ENDOSCOPY;  Service: Gastroenterology;  Laterality: N/A;   FRACTURE SURGERY     HEMOSTASIS CLIP PLACEMENT  09/22/2020   Procedure: HEMOSTASIS CLIP PLACEMENT;  Surgeon: Lemar Lofty., MD;  Location: [facility] ENDOSCOPY;  Service: Gastroenterology;;   HEMOSTASIS CONTROL  09/22/2020   Procedure: HEMOSTASIS CONTROL;  Surgeon: Lemar Lofty., MD;  Location: [facility] ENDOSCOPY;  Service: Gastroenterology;;   LEFT HEART CATH AND CORONARY ANGIOGRAPHY Left 06/09/2020   Procedure: LEFT HEART CATH AND CORONARY ANGIOGRAPHY;  Surgeon: Alwyn Pea, MD;  Location: [facility] INVASIVE CV LAB;  Service: Cardiovascular;  Laterality: Left;   LEFT HEART CATH AND CORS/GRAFTS ANGIOGRAPHY Left 11/20/2016    Procedure: Left Heart Cath and Cors/Grafts Angiography;  Surgeon: Alwyn Pea, MD;  Location: [facility] INVASIVE CV LAB;  Service: Cardiovascular;  Laterality: Left;   POLYPECTOMY  09/22/2020   Procedure: POLYPECTOMY;  Surgeon: Mansouraty, Netty Starring., MD;  Location: [facility] ENDOSCOPY;  Service: Gastroenterology;;   SUBMUCOSAL LIFTING INJECTION  09/22/2020   Procedure: SUBMUCOSAL LIFTING INJECTION;  Surgeon: Lemar Lofty., MD;  Location: [facility] ENDOSCOPY;  Service: Gastroenterology;;    Medications Prior to Admission  Medication Sig Dispense Refill Last Dose   amLODipine (NORVASC) 10 MG tablet Take 10 mg by mouth daily.   Past Week   aspirin EC 81 MG tablet Take 81 mg by mouth daily. Swallow whole.   Past Week   clopidogrel (PLAVIX) 75 MG tablet Take 75 mg by mouth daily.   Past Week   dexlansoprazole (DEXILANT) 60 MG capsule Take 1 capsule by mouth daily.   Past Week   hydrALAZINE (APRESOLINE) 50 MG tablet Take 1 tablet by mouth 3 (three) times daily.   Past Week   hydrochlorothiazide (HYDRODIURIL) 12.5 MG tablet Take 12.5 mg by mouth daily.   Past Week   insulin glargine (LANTUS) 100 UNIT/ML injection Inject 25 Units into the skin at bedtime.    Past Week   irbesartan (AVAPRO) 300 MG tablet Take 1 tablet (300 mg total) by mouth daily. 30 tablet 1 Past Week   labetalol (NORMODYNE) 100 MG tablet Take 1 tablet by mouth 2 (two) times daily.   Past Week   metFORMIN (GLUCOPHAGE-XR) 500 MG 24 hr tablet Take 500 mg by mouth 2 (two) times daily.   Past Week   rosuvastatin (CRESTOR) 40 MG tablet Take 40 mg by mouth daily.   Past Week   sitaGLIPtin (JANUVIA) 100 MG tablet Take 100 mg by mouth daily.   Past Week   Testosterone 1.62 % GEL Apply 2 Pump topically daily. One pump on each arm 225 g 3 Past Week   acetaminophen (TYLENOL) 325 MG tablet Take 2 tablets (650 mg total) by mouth every 6 (six) hours as needed for mild pain (or Fever >/= 101). (Patient not taking: Reported on 08/21/2022)      nebivolol  (BYSTOLIC) 10 MG tablet Take 10 mg by mouth daily. (Patient not taking: Reported on 12/20/2022)   Not Taking   torsemide (DEMADEX) 20 MG tablet Take 20 mg by mouth 2 (two) times daily.       Social History   Socioeconomic History   Marital status: Married    Spouse name: Not on file   Number of children: Not on file   Years of education: Not on file   Highest education level: Not on file  Occupational History   Not on file  Tobacco Use   Smoking status: Former    Types: Cigarettes   Smokeless tobacco: Never  Vaping Use   Vaping Use: Never used  Substance and Sexual Activity   Alcohol use: No   Drug use: No   Sexual activity: Not Currently  Other Topics Concern   Not on file  Social History Narrative   Not on file   Social Determinants of  Health   Financial Resource Strain: Not on file  Food Insecurity: Not on file  Transportation Needs: Not on file  Physical Activity: Not on file  Stress: Not on file  Social Connections: Not on file  Intimate Partner Violence: Not on file    History reviewed. No pertinent family history.    Intake/Output Summary (Last 24 hours) at 12/23/2022 1129 Last data filed at 12/23/2022 1048 Gross per 24 hour  Intake 1964.83 ml  Output 4225 ml  Net -2260.17 ml    Vitals:   12/22/22 2000 12/23/22 0400 12/23/22 0800 12/23/22 0920  BP: (!) 163/67 (!) 161/81 (!) 163/87   Pulse: 66 60 62   Resp: 17 16 17    Temp: 98.3 F (36.8 C) 99.7 F (37.6 C) 98.8 F (37.1 C)   TempSrc: Oral Oral    SpO2: 95% 98% 95%   Weight:    100.1 kg  Height:        PHYSICAL EXAM General: Pleasant ill-appearing, Caucasian male, upright in bed  HEENT:  Normocephalic and atraumatic. Neck:  No JVD.  Lungs: Normal respiratory effort on oxygen by nasal cannula crackles bilaterally to auscultation without appreciable wheezing.   Heart: Irregularly irregular with controlled rate.  S1-S2 present 3/6 systolic murmur heard throughout Abdomen: Distended appearing.   Msk: Normal strength and tone for age. Extremities: Legs now wrapped. Neuro: Alert and oriented X 3. Psych:  Answers questions appropriately.   Labs: Basic Metabolic Panel: Recent Labs    12/22/22 0454 12/23/22 0604  NA 137 135  K 3.0* 3.4*  CL 101 95*  CO2 30 32  GLUCOSE 97 146*  BUN 25* 27*  CREATININE 1.22 1.40*  CALCIUM 9.9 10.2  MG 1.8 1.9   Liver Function Tests: Recent Labs    12/20/22 1258  AST 22  ALT 21  ALKPHOS 141*  BILITOT 1.5*  PROT 7.8  ALBUMIN 3.9   No results for input(s): "LIPASE", "AMYLASE" in the last 72 hours. CBC: Recent Labs    12/20/22 1258 12/21/22 0605 12/22/22 0454 12/23/22 0604  WBC 6.5 7.2 4.3 5.5  NEUTROABS 5.3 6.2  --   --   HGB 11.0* 11.3* 9.5* 10.5*  HCT 35.7* 36.1* 29.7* 33.0*  MCV 94.9 94.3 92.8 92.7  PLT 141* 125* 105* 131*   Cardiac Enzymes: Recent Labs    12/20/22 1258 12/20/22 1443  TROPONINIHS 12 14   BNP: Recent Labs    12/20/22 1258  BNP 1,180.2*   D-Dimer: No results for input(s): "DDIMER" in the last 72 hours. Hemoglobin A1C: Recent Labs    12/21/22 0605  HGBA1C 5.8*   Fasting Lipid Panel: Recent Labs    12/21/22 0443  CHOL 85  HDL 45  LDLCALC 29  TRIG 53  CHOLHDL 1.9   Thyroid Function Tests: No results for input(s): "TSH", "T4TOTAL", "T3FREE", "THYROIDAB" in the last 72 hours.  Invalid input(s): "FREET3" Anemia Panel: No results for input(s): "VITAMINB12", "FOLATE", "FERRITIN", "TIBC", "IRON", "RETICCTPCT" in the last 72 hours.   Radiology: US Venous Img Lower Bilateral  Result Date: 12/20/2022 CLINICAL DATA:  Bilateral lower extremity swelling and pain EXAM: BILATERAL LOWER EXTREMITY VENOUS DOPPLER ULTRASOUND TECHNIQUE: Gray-scale sonography with graded compression, as well as color Doppler and duplex ultrasound were performed to evaluate the lower extremity deep venous systems from the level of the common femoral vein and including the common femoral, femoral, profunda femoral,  popliteal and calf veins including the posterior tibial, peroneal and gastrocnemius veins when visible. Spectral Doppler was  utilized to evaluate flow at rest and with distal augmentation maneuvers in the common femoral, femoral and popliteal veins. COMPARISON:  None Available. FINDINGS: RIGHT LOWER EXTREMITY Common Femoral Vein: No evidence of thrombus. Normal compressibility, respiratory phasicity and response to augmentation. Saphenofemoral Junction: No evidence of thrombus. Normal compressibility and flow on color Doppler imaging. Profunda Femoral Vein: No evidence of thrombus. Normal compressibility and flow on color Doppler imaging. Femoral Vein: No evidence of thrombus. Normal compressibility, respiratory phasicity and response to augmentation. Popliteal Vein: No evidence of thrombus. Normal compressibility, respiratory phasicity and response to augmentation. Calf Veins: No evidence of thrombus. Normal compressibility and flow on color Doppler imaging. LEFT LOWER EXTREMITY Common Femoral Vein: No evidence of thrombus. Normal compressibility, respiratory phasicity and response to augmentation. Saphenofemoral Junction: No evidence of thrombus. Normal compressibility and flow on color Doppler imaging. Profunda Femoral Vein: No evidence of thrombus. Normal compressibility and flow on color Doppler imaging. Femoral Vein: No evidence of thrombus. Normal compressibility, respiratory phasicity and response to augmentation. Popliteal Vein: No evidence of thrombus. Normal compressibility, respiratory phasicity and response to augmentation. Calf Veins: No evidence of thrombus. Normal compressibility and flow on color Doppler imaging. Other Findings: Benign-appearing fatty replaced elongated lymph nodes noted bilaterally. Extremity subcutaneous edema present bilaterally. IMPRESSION: No evidence of deep venous thrombosis in either lower extremity. Electronically Signed   By: Judie Petit.  Shick M.D.   On: 12/20/2022 14:38   DG  Chest Portable 1 View  Result Date: 12/20/2022 CLINICAL DATA:  Shortness of breath EXAM: PORTABLE CHEST 1 VIEW COMPARISON:  X-ray 08/21/2020 FINDINGS: Underinflation with bilateral small pleural effusions, new from previous. Adjacent opacity. Vascular congestion. No pneumothorax. Status post median sternotomy. The heart is enlarged. Overlapping cardiac leads. IMPRESSION: Postop chest. Enlarged cardiopericardial silhouette with vascular congestion and small effusions. Adjacent lung opacities. Recommend follow-up Electronically Signed   By: Karen Kays M.D.   On: 12/20/2022 13:37    LHC 2021  LM lesion is 40% stenosed. Prox RCA lesion is 99% stenosed. Mid RCA lesion is 75% stenosed. Dist RCA lesion is 95% stenosed. RPDA lesion is 90% stenosed. SVG and is normal in caliber. The flow is not reversed. There is no competitive flow SVG and is normal in caliber. The flow is not reversed. There is no competitive flow Prox Graft lesion is 100% stenosed. LIMA and is normal in caliber. The flow is not reversed. There is no competitive flow Origin lesion is 95% stenosed. Previously placed Prox Cx to Dist Cx stent (unknown type) is widely patent. Dist Cx lesion is 90% stenosed. Previously placed Ost LAD to Mid LAD stent (unknown type) is widely patent. Origin to Prox Graft lesion is 100% stenosed. A drug-eluting stent was successfully placed using a STENT RESOLUTE ONYX 2.5X18. Post intervention, there is a 0% residual stenosis. Ost Cx lesion is 70% stenosed. Dist LAD-1 lesion is 50% stenosed. Previously placed Dist LAD-2 stent (unknown type) is widely patent. Ost LAD lesion is 75% stenosed. Preserved left ventricular function ejection fraction of 60%   Conclusion Normal left ventricular function 60% Coronaries Left main large calcified moderate lesion moderate to large calcium Distal LAD large ostial lesion 75% moderate calcification proximal LAD stent widely patent mid LAD stent widely  patent distal LAD diffuse 50% Circumflex is large 70% ostial mid stent widely patent distal circumflex 95% TIMI-3 flow RCA medium proximal 90% lesion 90% mid distal 75 Grafts LIMA to LAD incompletely evaluated probably occluded SVG to circumflex 100% occluded SVG to PDA 95% ostial in-stent  TIMI-3 flow Intervention Successful PCI and stent to in-stent restenosis ostially with 2.5 x 18 mm resolute Onyx to 20 atm Lesion reduced from 95 down to 0% TIMI-3 flow maintained throughout   Successful PCI and stent ostial SVG in-stent restenosis. Lesion reduced from 95 down to 0%  ECHO  NORMAL LEFT VENTRICULAR SYSTOLIC FUNCTION   WITH SEVERE LVH  NORMAL RIGHT VENTRICULAR SYSTOLIC FUNCTION  MODERATE VALVULAR REGURGITATION (See above)  MILD VALVULAR STENOSIS (See above)  IRREGULAR HEART RHYTHM CAPTURED THROUGHOUT EXAM  ESTIMATED LVEF >55%  MODERATE AI  MILD AS; Max Velocity: 3.40m/s; AVA: 1.1cm^2  (PREV. ECHO: 2.67m/s; AVA: 1.9cm^2)  MODERATE - SEVERE MR (MVP: MILD - AMVL)  MODERATE TR; SEVERE PHTN  SEVERE LAE; MILD RAE  MILDLY DILATED ASCENDING AORTA MEASURING UP TO 3.7cm  PATIENT IMAGED SUPINE FROM APICALS TO PEDOFF TO SUBCOSTAL; PATIENT HAD DIFFICULTY LAYING  DOWN; FURTHER EVALUATION DID NOT SHOW ANY SIGNS OF TAMPONADE  _________________________________________________________________________________________  Electronically signed by    Dorothyann Peng, MD on 09/28/2022 07: 54 AM           Performed By: Verdis Prime     Ordering Physician: Dorothyann Peng, MD   TELEMETRY reviewed by me [facility]) 12/23/2022 : AF rate 50-60s with overnight pauses  EKG reviewed by me: AF RBBB and LAFB rate 70  Data reviewed by me [facility]) 12/23/2022: Hospitalist progress note, nursing notes last 24h vitals tele labs imaging I/O    Principal Problem:   Acute heart failure with preserved ejection fraction (HFpEF) (HCC) Active Problems:   Insulin dependent type 2 diabetes mellitus (HCC)   Essential hypertension    OSA (obstructive sleep apnea)   CAD (coronary artery disease), native coronary artery   Chronic venous stasis   Paroxysmal atrial fibrillation (HCC)    ASSESSMENT AND PLAN:  [name] is a 69yoM with a PMH of CAD s/p CABG (LIMA-LAD, SVG - LCX 100% occluded, SVG - PDA s/p PCI (2021) with prior stents, paroxysmal AF (not anticoagulated), HFpEF (severe LVH, mild AS, moderate AI, moderate to severe MR, moderate TR by echo 09/2022) DM2, CKD, OSA, who presented to [facility] for an outpatient left heart cath with Dr. Juliann Pares the morning of 5/9.  He was orthopneic with significant peripheral edema and was ultimately unable to lay flat for the procedure. He was referred to [facility] ED for further evaluation and management of acute on chronic heart failure.  # Acute on chronic HFpEF # Moderate to severe MR # Moderate AI Presents with 2 months of progressively worsening shortness of breath and exertional chest pain for which LHC was initially recommended by Dr. Juliann Pares.  Over the past 2 weeks his peripheral edema, orthopnea, and dyspnea have significantly worsened, refractory to outpatient torsemide. On admission he was massively volume overloaded on exam with tense woody edema with weeping from wounds on his legs, with good initial diuresis. Anticipate need for at least several additional days of IV diuresis -Continue Lasix infusion at 10 mg/h. Cr 1.22 today from 1.19 yesterday, will continue to monitor.  -Will give K today. Monitor and replenish electrolytes for a goal K >4, mag >2 -Continue Nitropaste 1 inch every 6 hours to assist with vasodilation and BP control -Continue metoprolol tartrate 25 mg twice daily with hold parameters for HR <60 -Continue hydralazine 50 mg 3 times daily -Continue spironolactone to 25 mg daily  -Further additions of ARB, SGLT2i pending stability of his renal function. -Echo complete pending  # CAD s/p CABG and multiple prior  PCI's Chest pain-free at rest, EKGs nonacute,  troponins negative.  Plan to continue aspirin and statin.   No immediate plan for invasive cardiac diagnostics.  Continue management of his heart failure as above -Will defer LHC during this hospitalization, plan to reassess at outpatient hospital follow up appointment.   # Paroxysmal atrial fibrillation # Pauses Slow rate on telemetry today, was not on anticoagulation prior to this admission for unknown reasons.  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   -Pauses overnight noted on tele, will continue to monitor. Patient asymptomatic -Will continue metoprolol for rate control -After discussion of risks/benefits on 5/10, patient is agreeable to starting a DOAC at discharge with close monitoring for bleeding side effects and H&H. CHA2DS2-VASc 5 (age, CHF, HTN, CAD)  -Transition from IV heparin to po Eliquis 5mg  twice daily for stroke prevention.     This patient's plan of care was discussed and created with Dr. Darrold Junker and he is in agreement.  Signed: Gale Journey , PA-C 12/23/2022, 11:29 AM [facility] Cardiology

## 2022-12-24 DIAGNOSIS — I5031 Acute diastolic (congestive) heart failure: Secondary | ICD-10-CM | POA: Diagnosis not present

## 2022-12-24 LAB — CBC
HCT: 35 % — ABNORMAL LOW (ref 39.0–52.0)
Hemoglobin: 11.3 g/dL — ABNORMAL LOW (ref 13.0–17.0)
MCH: 29.6 pg (ref 26.0–34.0)
MCHC: 32.3 g/dL (ref 30.0–36.0)
MCV: 91.6 fL (ref 80.0–100.0)
Platelets: 137 10*3/uL — ABNORMAL LOW (ref 150–400)
RBC: 3.82 MIL/uL — ABNORMAL LOW (ref 4.22–5.81)
RDW: 16.8 % — ABNORMAL HIGH (ref 11.5–15.5)
WBC: 8.5 10*3/uL (ref 4.0–10.5)
nRBC: 0 % (ref 0.0–0.2)

## 2022-12-24 LAB — BASIC METABOLIC PANEL
Anion gap: 11 (ref 5–15)
BUN: 29 mg/dL — ABNORMAL HIGH (ref 8–23)
CO2: 33 mmol/L — ABNORMAL HIGH (ref 22–32)
Calcium: 10.4 mg/dL — ABNORMAL HIGH (ref 8.9–10.3)
Chloride: 90 mmol/L — ABNORMAL LOW (ref 98–111)
Creatinine, Ser: 1.37 mg/dL — ABNORMAL HIGH (ref 0.61–1.24)
GFR, Estimated: 54 mL/min — ABNORMAL LOW (ref >60)
Glucose, Bld: 156 mg/dL — ABNORMAL HIGH (ref 70–99)
Potassium: 3.9 mmol/L (ref 3.5–5.1)
Sodium: 134 mmol/L — ABNORMAL LOW (ref 135–145)

## 2022-12-24 LAB — GLUCOSE, CAPILLARY
Glucose-Capillary: 132 mg/dL — ABNORMAL HIGH (ref 70–99)
Glucose-Capillary: 150 mg/dL — ABNORMAL HIGH (ref 70–99)
Glucose-Capillary: 215 mg/dL — ABNORMAL HIGH (ref 70–99)
Glucose-Capillary: 131 mg/dL — ABNORMAL HIGH (ref 70–99)
Glucose-Capillary: 189 mg/dL — ABNORMAL HIGH (ref 70–99)

## 2022-12-24 LAB — MAGNESIUM: Magnesium: 2 mg/dL (ref 1.7–2.4)

## 2022-12-24 MED ORDER — HYDRALAZINE HCL 50 MG PO TABS
100.0000 mg | ORAL_TABLET | Freq: Three times a day (TID) | ORAL | Status: DC
  Administered 2022-12-24 – 2022-12-27 (×9): 100 mg via ORAL
  Filled 2022-12-24 (×9): qty 2

## 2022-12-24 MED ORDER — HYDRALAZINE HCL 50 MG PO TABS: 100.0000 mg | ORAL_TABLET | Freq: Three times a day (TID) | ORAL | Status: DC

## 2022-12-24 NOTE — Progress Notes (Signed)
[facility] CLINIC CARDIOLOGY CONSULT NOTE       Patient ID: [name] MRN: [id number] DOB/AGE: Jul 08, 1946 77 y.o.  Admit date: 12/20/2022 Referring Physician Dr. Artis Delay Primary Physician  Primary Cardiologist Dr. Juliann Pares Reason for Consultation AoCHF  HPI: [name]. [name] is a 40yoM with a PMH of CAD s/p CABG (LIMA-LAD, SVG - LCX 100% occluded, SVG - PDA s/p PCI (2021) with prior stents, paroxysmal AF (not anticoagulated), HFpEF (severe LVH, mild AS, moderate AI, moderate to severe MR, moderate TR by echo 09/2022) DM2, CKD, OSA, who presented to [facility] for an outpatient left heart cath with Dr. Juliann Pares the morning of 5/9.  He was orthopneic with significant peripheral edema and was ultimately unable to lay flat for the procedure. He was referred to [facility] ED for further evaluation and management of acute on chronic heart failure.  Interval history: -Continues to diurese well and with overall clinical improvement -On room air, legs with compression wrappings -Peripheral edema remains significant but overall improved -Denies chest pain or shortness of breath -Renal function stable -Echo resulted with fortunately preserved LVEF, mild to moderate left atrial dilation, moderate MR, and mild to moderate aortic stenosis, mild TR - Net IO Since Admission: -12,483.64 mL [12/24/22 1529]   Review of systems complete and found to be negative unless listed above     Past Medical History:  Diagnosis Date   Anemia    Arthritis    Atrial fibrillation (HCC)    AV block, 1st degree    Bundle branch block    Chronic kidney disease    patient not aware of this dx   Coronary artery disease    Diabetes mellitus without complication (HCC)    type 2   Dyspnea    GERD (gastroesophageal reflux disease)    History of blood transfusion    Hypertension    Sleep apnea    does not use cpap    Past Surgical History:  Procedure Laterality Date   APPENDECTOMY     CARDIAC CATHETERIZATION Left  08/16/2016   Procedure: Left Heart Cath and Coronary Angiography;  Surgeon: Alwyn Pea, MD;  Location: [facility] INVASIVE CV LAB;  Service: Cardiovascular;  Laterality: Left;   COLON SURGERY     part of colon removed   COLONOSCOPY N/A 09/07/2020   Procedure: COLONOSCOPY;  Surgeon: Pasty Spillers, MD;  Location: [facility] ENDOSCOPY;  Service: Endoscopy;  Laterality: N/A;   COLONOSCOPY WITH PROPOFOL N/A 05/17/2021   Procedure: COLONOSCOPY WITH PROPOFOL;  Surgeon: Pasty Spillers, MD;  Location: [facility] ENDOSCOPY;  Service: Endoscopy;  Laterality: N/A;   CORONARY ARTERY BYPASS GRAFT  08/30/2016   triple   CORONARY STENT INTERVENTION N/A 06/09/2020   Procedure: CORONARY STENT INTERVENTION;  Surgeon: Alwyn Pea, MD;  Location: [facility] INVASIVE CV LAB;  Service: Cardiovascular;  Laterality: N/A;   ENDOSCOPIC MUCOSAL RESECTION N/A 09/22/2020   Procedure: ENDOSCOPIC MUCOSAL RESECTION;  Surgeon: Meridee Score Netty Starring., MD;  Location: [facility] ENDOSCOPY;  Service: Gastroenterology;  Laterality: N/A;   ESOPHAGOGASTRODUODENOSCOPY N/A 09/07/2020   Procedure: ESOPHAGOGASTRODUODENOSCOPY (EGD);  Surgeon: Pasty Spillers, MD;  Location: [facility] ENDOSCOPY;  Service: Endoscopy;  Laterality: N/A;   ESOPHAGOGASTRODUODENOSCOPY (EGD) WITH PROPOFOL N/A 09/22/2020   Procedure: ESOPHAGOGASTRODUODENOSCOPY (EGD) WITH PROPOFOL;  Surgeon: Meridee Score Netty Starring., MD;  Location: [facility] ENDOSCOPY;  Service: Gastroenterology;  Laterality: N/A;   FRACTURE SURGERY     HEMOSTASIS CLIP PLACEMENT  09/22/2020   Procedure: HEMOSTASIS CLIP PLACEMENT;  Surgeon: Lemar Lofty., MD;  Location:  [facility] ENDOSCOPY;  Service: Gastroenterology;;   HEMOSTASIS CONTROL  09/22/2020   Procedure: HEMOSTASIS CONTROL;  Surgeon: Lemar Lofty., MD;  Location: [facility] ENDOSCOPY;  Service: Gastroenterology;;   LEFT HEART CATH AND CORONARY ANGIOGRAPHY Left 06/09/2020   Procedure: LEFT HEART CATH AND CORONARY ANGIOGRAPHY;  Surgeon: Alwyn Pea, MD;  Location: [facility] INVASIVE CV LAB;  Service: Cardiovascular;  Laterality: Left;   LEFT HEART CATH AND CORS/GRAFTS ANGIOGRAPHY Left 11/20/2016   Procedure: Left Heart Cath and Cors/Grafts Angiography;  Surgeon: Alwyn Pea, MD;  Location: [facility] INVASIVE CV LAB;  Service: Cardiovascular;  Laterality: Left;   POLYPECTOMY  09/22/2020   Procedure: POLYPECTOMY;  Surgeon: Mansouraty, Netty Starring., MD;  Location: [facility] ENDOSCOPY;  Service: Gastroenterology;;   SUBMUCOSAL LIFTING INJECTION  09/22/2020   Procedure: SUBMUCOSAL LIFTING INJECTION;  Surgeon: Lemar Lofty., MD;  Location: [facility] ENDOSCOPY;  Service: Gastroenterology;;    Medications Prior to Admission  Medication Sig Dispense Refill Last Dose   amLODipine (NORVASC) 10 MG tablet Take 10 mg by mouth daily.   Past Week   aspirin EC 81 MG tablet Take 81 mg by mouth daily. Swallow whole.   Past Week   clopidogrel (PLAVIX) 75 MG tablet Take 75 mg by mouth daily.   Past Week   dexlansoprazole (DEXILANT) 60 MG capsule Take 1 capsule by mouth daily.   Past Week   hydrALAZINE (APRESOLINE) 50 MG tablet Take 1 tablet by mouth 3 (three) times daily.   Past Week   hydrochlorothiazide (HYDRODIURIL) 12.5 MG tablet Take 12.5 mg by mouth daily.   Past Week   insulin glargine (LANTUS) 100 UNIT/ML injection Inject 25 Units into the skin at bedtime.    Past Week   irbesartan (AVAPRO) 300 MG tablet Take 1 tablet (300 mg total) by mouth daily. 30 tablet 1 Past Week   labetalol (NORMODYNE) 100 MG tablet Take 1 tablet by mouth 2 (two) times daily.   Past Week   metFORMIN (GLUCOPHAGE-XR) 500 MG 24 hr tablet Take 500 mg by mouth 2 (two) times daily.   Past Week   rosuvastatin (CRESTOR) 40 MG tablet Take 40 mg by mouth daily.   Past Week   sitaGLIPtin (JANUVIA) 100 MG tablet Take 100 mg by mouth daily.   Past Week   Testosterone 1.62 % GEL Apply 2 Pump topically daily. One pump on each arm 225 g 3 Past Week   acetaminophen (TYLENOL) 325 MG tablet  Take 2 tablets (650 mg total) by mouth every 6 (six) hours as needed for mild pain (or Fever >/= 101). (Patient not taking: Reported on 08/21/2022)      nebivolol (BYSTOLIC) 10 MG tablet Take 10 mg by mouth daily. (Patient not taking: Reported on 12/20/2022)   Not Taking   torsemide (DEMADEX) 20 MG tablet Take 20 mg by mouth 2 (two) times daily.       Social History   Socioeconomic History   Marital status: Married    Spouse name: Not on file   Number of children: Not on file   Years of education: Not on file   Highest education level: Not on file  Occupational History   Not on file  Tobacco Use   Smoking status: Former    Types: Cigarettes   Smokeless tobacco: Never  Vaping Use   Vaping Use: Never used  Substance and Sexual Activity   Alcohol use: No   Drug use: No   Sexual activity: Not Currently  Other Topics Concern  Not on file  Social History Narrative   Not on file   Social Determinants of Health   Financial Resource Strain: Not on file  Food Insecurity: Not on file  Transportation Needs: Not on file  Physical Activity: Not on file  Stress: Not on file  Social Connections: Not on file  Intimate Partner Violence: Not on file    History reviewed. No pertinent family history.    Intake/Output Summary (Last 24 hours) at 12/24/2022 0818 Last data filed at 12/24/2022 0525 Gross per 24 hour  Intake 810 ml  Output 4850 ml  Net -4040 ml     Vitals:   12/23/22 2000 12/23/22 2352 12/24/22 0400 12/24/22 0743  BP: (!) 168/86 (!) 170/92 (!) 168/84 (!) 152/75  Pulse: 67 66 66 70  Resp: 19 17 16 18   Temp: 97.8 F (36.6 C) 98.4 F (36.9 C) 98.2 F (36.8 C) 98.9 F (37.2 C)  TempSrc: Oral Oral Oral Oral  SpO2: 93% 96% 95% 93%  Weight:      Height:        PHYSICAL EXAM General: Pleasant ill-appearing, Caucasian male, upright in bed  HEENT:  Normocephalic and atraumatic. Neck:  No JVD.  Lungs: Normal respiratory effort on oxygen by room air, trace right basilar  crackles without appreciable wheezes.   Heart: Irregularly irregular with controlled rate.  S1-S2 present 3/6 systolic murmur heard throughout Abdomen: Distended appearing.  Msk: Normal strength and tone for age. Extremities: Both lower extremities with compression wrappings, 1+ edema to upper calf, overall clinical improvement. Neuro: Alert and oriented X 3. Psych:  Answers questions appropriately.   Labs: Basic Metabolic Panel: Recent Labs    12/23/22 0604 12/24/22 0558  NA 135 134*  K 3.4* 3.9  CL 95* 90*  CO2 32 33*  GLUCOSE 146* 156*  BUN 27* 29*  CREATININE 1.40* 1.37*  CALCIUM 10.2 10.4*  MG 1.9 2.0    Liver Function Tests: No results for input(s): "AST", "ALT", "ALKPHOS", "BILITOT", "PROT", "ALBUMIN" in the last 72 hours.  No results for input(s): "LIPASE", "AMYLASE" in the last 72 hours. CBC: Recent Labs    12/23/22 0604 12/24/22 0558  WBC 5.5 8.5  HGB 10.5* 11.3*  HCT 33.0* 35.0*  MCV 92.7 91.6  PLT 131* 137*    Cardiac Enzymes: No results for input(s): "CKTOTAL", "CKMB", "CKMBINDEX", "TROPONINIHS" in the last 72 hours.  BNP: No results for input(s): "BNP" in the last 72 hours.  D-Dimer: No results for input(s): "DDIMER" in the last 72 hours. Hemoglobin A1C: No results for input(s): "HGBA1C" in the last 72 hours.  Fasting Lipid Panel: No results for input(s): "CHOL", "HDL", "LDLCALC", "TRIG", "CHOLHDL", "LDLDIRECT" in the last 72 hours.  Thyroid Function Tests: No results for input(s): "TSH", "T4TOTAL", "T3FREE", "THYROIDAB" in the last 72 hours.  Invalid input(s): "FREET3" Anemia Panel: No results for input(s): "VITAMINB12", "FOLATE", "FERRITIN", "TIBC", "IRON", "RETICCTPCT" in the last 72 hours.   Radiology: ECHOCARDIOGRAM COMPLETE  Result Date: 12/23/2022    ECHOCARDIOGRAM REPORT   Patient Name:   LEDARRIUS ROSATI [facility] Date of Exam: 12/23/2022 Medical Rec #:  [id number]        Height:       73.0 in Accession #:    [id number]       Weight:        220.6 lb Date of Birth:  Jan 08, 1946         BSA:          2.243 m Patient Age:  77 years         BP:           147/68 mmHg Patient Gender: M                HR:           49 bpm. Exam Location:  [facility] Procedure: 2D Echo, Color Doppler and Cardiac Doppler Indications:     CHF--acute systolic I50.21  History:         Patient has no prior history of Echocardiogram examinations.                  Arrythmias:Atrial Fibrillation; Risk Factors:Diabetes,                  Hypertension and Sleep Apnea.  Sonographer:     Cristela Blue Referring Phys:  [id number] [facility] MICHELLE Nautia Lem Diagnosing Phys: Marcina Millard MD IMPRESSIONS  1. Left ventricular ejection fraction, by estimation, is 60 to 65%. The left ventricle has normal function. The left ventricle has no regional wall motion abnormalities. Left ventricular diastolic parameters were normal.  2. Right ventricular systolic function is normal. The right ventricular size is normal.  3. Left atrial size was mild to moderately dilated.  4. The mitral valve is normal in structure. Moderate mitral valve regurgitation. No evidence of mitral stenosis.  5. The aortic valve is normal in structure. Aortic valve regurgitation is not visualized. Mild to moderate aortic valve stenosis.  6. The inferior vena cava is normal in size with greater than 50% respiratory variability, suggesting right atrial pressure of 3 mmHg. FINDINGS  Left Ventricle: Left ventricular ejection fraction, by estimation, is 60 to 65%. The left ventricle has normal function. The left ventricle has no regional wall motion abnormalities. The left ventricular internal cavity size was normal in size. There is  no left ventricular hypertrophy. Left ventricular diastolic parameters were normal. Right Ventricle: The right ventricular size is normal. No increase in right ventricular wall thickness. Right ventricular systolic function is normal. Left Atrium: Left atrial size was mild to moderately dilated. Right Atrium: Right  atrial size was normal in size. Pericardium: There is no evidence of pericardial effusion. Mitral Valve: The mitral valve is normal in structure. Moderate mitral valve regurgitation. No evidence of mitral valve stenosis. MV peak gradient, 7.2 mmHg. The mean mitral valve gradient is 2.0 mmHg. Tricuspid Valve: The tricuspid valve is normal in structure. Tricuspid valve regurgitation is mild . No evidence of tricuspid stenosis. Aortic Valve: The aortic valve is normal in structure. Aortic valve regurgitation is not visualized. Mild to moderate aortic stenosis is present. Aortic valve mean gradient measures 15.0 mmHg. Aortic valve peak gradient measures 25.6 mmHg. Aortic valve area, by VTI measures 1.63 cm. Pulmonic Valve: The pulmonic valve was normal in structure. Pulmonic valve regurgitation is not visualized. No evidence of pulmonic stenosis. Aorta: The aortic root is normal in size and structure. Venous: The inferior vena cava is normal in size with greater than 50% respiratory variability, suggesting right atrial pressure of 3 mmHg. IAS/Shunts: No atrial level shunt detected by color flow Doppler.  LEFT VENTRICLE PLAX 2D LVIDd:         3.60 cm   Diastology LVIDs:         2.30 cm   LV e' medial:    6.42 cm/s LV PW:         1.90 cm   LV E/e' medial:  20.9 LV IVS:  1.80 cm   LV e' lateral:   9.25 cm/s LVOT diam:     2.00 cm   LV E/e' lateral: 14.5 LV SV:         86 LV SV Index:   38 LVOT Area:     3.14 cm  RIGHT VENTRICLE RV Basal diam:  5.00 cm RV Mid diam:    3.50 cm RV S prime:     13.70 cm/s TAPSE (M-mode): 2.5 cm LEFT ATRIUM             Index        RIGHT ATRIUM           Index LA diam:        4.90 cm 2.18 cm/m   RA Area:     21.20 cm LA Vol (A2C):   27.6 ml 12.30 ml/m  RA Volume:   64.70 ml  28.84 ml/m LA Vol (A4C):   57.9 ml 25.81 ml/m LA Biplane Vol: 43.3 ml 19.30 ml/m  AORTIC VALVE AV Area (Vmax):    1.53 cm AV Area (Vmean):   1.55 cm AV Area (VTI):     1.63 cm AV Vmax:           253.00 cm/s  AV Vmean:          180.667 cm/s AV VTI:            0.525 m AV Peak Grad:      25.6 mmHg AV Mean Grad:      15.0 mmHg LVOT Vmax:         123.00 cm/s LVOT Vmean:        89.100 cm/s LVOT VTI:          0.273 m LVOT/AV VTI ratio: 0.52  AORTA Ao Root diam: 3.20 cm MITRAL VALVE                TRICUSPID VALVE MV Area (PHT): 4.06 cm     TR Peak grad:   28.3 mmHg MV Area VTI:   2.69 cm     TR Vmax:        266.00 cm/s MV Peak grad:  7.2 mmHg MV Mean grad:  2.0 mmHg     SHUNTS MV Vmax:       1.34 m/s     Systemic VTI:  0.27 m MV Vmean:      70.8 cm/s    Systemic Diam: 2.00 cm MV Decel Time: 187 msec MV E velocity: 134.00 cm/s MV A velocity: 70.70 cm/s MV E/A ratio:  1.90 Marcina Millard MD Electronically signed by Marcina Millard MD Signature Date/Time: 12/23/2022/3:18:55 PM    Final    US Venous Img Lower Bilateral  Result Date: 12/20/2022 CLINICAL DATA:  Bilateral lower extremity swelling and pain EXAM: BILATERAL LOWER EXTREMITY VENOUS DOPPLER ULTRASOUND TECHNIQUE: Gray-scale sonography with graded compression, as well as color Doppler and duplex ultrasound were performed to evaluate the lower extremity deep venous systems from the level of the common femoral vein and including the common femoral, femoral, profunda femoral, popliteal and calf veins including the posterior tibial, peroneal and gastrocnemius veins when visible. Spectral Doppler was utilized to evaluate flow at rest and with distal augmentation maneuvers in the common femoral, femoral and popliteal veins. COMPARISON:  None Available. FINDINGS: RIGHT LOWER EXTREMITY Common Femoral Vein: No evidence of thrombus. Normal compressibility, respiratory phasicity and response to augmentation. Saphenofemoral Junction: No evidence of thrombus. Normal compressibility and flow on color Doppler imaging. Profunda Femoral Vein:  No evidence of thrombus. Normal compressibility and flow on color Doppler imaging. Femoral Vein: No evidence of thrombus. Normal  compressibility, respiratory phasicity and response to augmentation. Popliteal Vein: No evidence of thrombus. Normal compressibility, respiratory phasicity and response to augmentation. Calf Veins: No evidence of thrombus. Normal compressibility and flow on color Doppler imaging. LEFT LOWER EXTREMITY Common Femoral Vein: No evidence of thrombus. Normal compressibility, respiratory phasicity and response to augmentation. Saphenofemoral Junction: No evidence of thrombus. Normal compressibility and flow on color Doppler imaging. Profunda Femoral Vein: No evidence of thrombus. Normal compressibility and flow on color Doppler imaging. Femoral Vein: No evidence of thrombus. Normal compressibility, respiratory phasicity and response to augmentation. Popliteal Vein: No evidence of thrombus. Normal compressibility, respiratory phasicity and response to augmentation. Calf Veins: No evidence of thrombus. Normal compressibility and flow on color Doppler imaging. Other Findings: Benign-appearing fatty replaced elongated lymph nodes noted bilaterally. Extremity subcutaneous edema present bilaterally. IMPRESSION: No evidence of deep venous thrombosis in either lower extremity. Electronically Signed   By: Judie Petit.  Shick M.D.   On: 12/20/2022 14:38   DG Chest Portable 1 View  Result Date: 12/20/2022 CLINICAL DATA:  Shortness of breath EXAM: PORTABLE CHEST 1 VIEW COMPARISON:  X-ray 08/21/2020 FINDINGS: Underinflation with bilateral small pleural effusions, new from previous. Adjacent opacity. Vascular congestion. No pneumothorax. Status post median sternotomy. The heart is enlarged. Overlapping cardiac leads. IMPRESSION: Postop chest. Enlarged cardiopericardial silhouette with vascular congestion and small effusions. Adjacent lung opacities. Recommend follow-up Electronically Signed   By: Karen Kays M.D.   On: 12/20/2022 13:37    LHC 2021  LM lesion is 40% stenosed. Prox RCA lesion is 99% stenosed. Mid RCA lesion is 75%  stenosed. Dist RCA lesion is 95% stenosed. RPDA lesion is 90% stenosed. SVG and is normal in caliber. The flow is not reversed. There is no competitive flow SVG and is normal in caliber. The flow is not reversed. There is no competitive flow Prox Graft lesion is 100% stenosed. LIMA and is normal in caliber. The flow is not reversed. There is no competitive flow Origin lesion is 95% stenosed. Previously placed Prox Cx to Dist Cx stent (unknown type) is widely patent. Dist Cx lesion is 90% stenosed. Previously placed Ost LAD to Mid LAD stent (unknown type) is widely patent. Origin to Prox Graft lesion is 100% stenosed. A drug-eluting stent was successfully placed using a STENT RESOLUTE ONYX 2.5X18. Post intervention, there is a 0% residual stenosis. Ost Cx lesion is 70% stenosed. Dist LAD-1 lesion is 50% stenosed. Previously placed Dist LAD-2 stent (unknown type) is widely patent. Ost LAD lesion is 75% stenosed. Preserved left ventricular function ejection fraction of 60%   Conclusion Normal left ventricular function 60% Coronaries Left main large calcified moderate lesion moderate to large calcium Distal LAD large ostial lesion 75% moderate calcification proximal LAD stent widely patent mid LAD stent widely patent distal LAD diffuse 50% Circumflex is large 70% ostial mid stent widely patent distal circumflex 95% TIMI-3 flow RCA medium proximal 90% lesion 90% mid distal 75 Grafts LIMA to LAD incompletely evaluated probably occluded SVG to circumflex 100% occluded SVG to PDA 95% ostial in-stent TIMI-3 flow Intervention Successful PCI and stent to in-stent restenosis ostially with 2.5 x 18 mm resolute Onyx to 20 atm Lesion reduced from 95 down to 0% TIMI-3 flow maintained throughout   Successful PCI and stent ostial SVG in-stent restenosis. Lesion reduced from 95 down to 0%  ECHO  NORMAL LEFT VENTRICULAR SYSTOLIC FUNCTION  WITH SEVERE LVH  NORMAL RIGHT VENTRICULAR SYSTOLIC  FUNCTION  MODERATE VALVULAR REGURGITATION (See above)  MILD VALVULAR STENOSIS (See above)  IRREGULAR HEART RHYTHM CAPTURED THROUGHOUT EXAM  ESTIMATED LVEF >55%  MODERATE AI  MILD AS; Max Velocity: 3.39m/s; AVA: 1.1cm^2  (PREV. ECHO: 2.45m/s; AVA: 1.9cm^2)  MODERATE - SEVERE MR (MVP: MILD - AMVL)  MODERATE TR; SEVERE PHTN  SEVERE LAE; MILD RAE  MILDLY DILATED ASCENDING AORTA MEASURING UP TO 3.7cm  PATIENT IMAGED SUPINE FROM APICALS TO PEDOFF TO SUBCOSTAL; PATIENT HAD DIFFICULTY LAYING  DOWN; FURTHER EVALUATION DID NOT SHOW ANY SIGNS OF TAMPONADE  _________________________________________________________________________________________  Electronically signed by    Dorothyann Peng, MD on 09/28/2022 07: 54 AM           Performed By: Verdis Prime     Ordering Physician: Dorothyann Peng, MD   TELEMETRY reviewed by me (LT) 12/24/2022 : AF rate 60s to 70s  EKG reviewed by me: AF RBBB and LAFB rate 70  Data reviewed by me (LT) 12/24/2022: Hospitalist progress note, nursing notes last 24h vitals tele labs imaging I/O    Principal Problem:   Acute heart failure with preserved ejection fraction (HFpEF) (HCC) Active Problems:   Insulin dependent type 2 diabetes mellitus (HCC)   Essential hypertension   OSA (obstructive sleep apnea)   CAD (coronary artery disease), native coronary artery   Chronic venous stasis   Paroxysmal atrial fibrillation (HCC)    ASSESSMENT AND PLAN:  Normand Sloop P. Maga is a 71yoM with a PMH of CAD s/p CABG (LIMA-LAD, SVG - LCX 100% occluded, SVG - PDA s/p PCI (2021) with prior stents, paroxysmal AF (not anticoagulated), HFpEF (severe LVH, mild AS, moderate AI, moderate to severe MR, moderate TR by echo 09/2022) DM2, CKD, OSA, who presented to [facility] for an outpatient left heart cath with Dr. Juliann Pares the morning of 5/9.  He was orthopneic with significant peripheral edema and was ultimately unable to lay flat for the procedure. He was referred to [facility] ED for further evaluation  and management of acute on chronic heart failure.  # Acute on chronic HFpEF # Moderate to severe MR # Mild-moderate AS, no AI Presents with 2 months of progressively worsening shortness of breath and exertional chest pain for which LHC was initially recommended by Dr. Juliann Pares.  Over the past 2 weeks his peripheral edema, orthopnea, and dyspnea have significantly worsened, refractory to outpatient torsemide. On admission he was massively volume overloaded on exam with tense woody edema with weeping from wounds on his legs, excellent diuresis on a Lasix drip with Net IO Since Admission: -12,483.64 mL [12/24/22 1531].  Echo resulted with preserved LVEF, moderate MR, mild to moderate AAS without aortic insufficiency visualized -Continue Lasix infusion at 10 mg/h.  - Monitor and replenish electrolytes for a goal K >4, mag >2 -Continue Nitropaste 1 inch every 6 hours to assist with vasodilation and BP control, can likely change this to p.o. Isordil tomorrow -Continue metoprolol tartrate 25 mg twice daily with hold parameters for HR <60 -Continue hydralazine 50 mg 3 times daily -Continue spironolactone to 25 mg daily  -Further additions of ARB, SGLT2i pending stability of his renal function. -Discussed low-salt diet / alternatives which the patient is motivated to adhere to  # CAD s/p CABG and multiple prior PCI's Chest pain-free at rest, EKGs nonacute, troponins negative.     No immediate plan for invasive cardiac diagnostics.  Continue management of his heart failure as above -Will defer LHC during this hospitalization, plan  to reassess at outpatient hospital follow up appointment.  -Continue Plavix 75 mg daily -Drop aspirin 81 mg daily as Eliquis started.  # Paroxysmal atrial fibrillation # Pauses Rate 60s-70s without pauses today.  Not historically anticoagulated prior to this admission for unknown reasons.  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   -Continue telemetry monitoring while inpatient -Will continue metoprolol for rate control -Continue rate control, echo with some LA remodeling suggesting that rhythm control may be difficult to achieve -After discussion of risks/benefits on 5/10, patient is agreeable to starting a DOAC at discharge with close monitoring for bleeding side effects and H&H. CHA2DS2-VASc 5 (age, CHF, HTN, CAD)  -Continue Eliquis 5mg  twice daily for stroke prevention.    This patient's plan of care was discussed and created with Dr. Darrold Junker and he is in agreement.  Signed: Rebeca Allegra , PA-C 12/24/2022, 8:18 AM [facility]

## 2022-12-24 NOTE — Progress Notes (Signed)
PROGRESS NOTE    [name]  WUJ:[id number] DOB: 02-21-1946 DOA: 12/20/2022 PCP: Jaclyn Shaggy, MD  250A/250A-AA  LOS: 4 days   Brief hospital course:   Assessment & Plan: [name] is a 77 y.o. male with medical history significant of CAD s/p CABG and DES, severe pulmonary hypertension, hypertension, hyperlipidemia, OSA, type 2 diabetes, hypothyroidism, who presents to the ED due to shortness of breath.     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  He had initially come to the hospital for a left heart cath but was unable to lay flat.  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  Cardiology has been consulted and initiated Lasix drip. Plan: --cont Lasix gtt --cont Nitropaste 1 inch every 6 hours to assist with vasodilation and BP control -cont metoprolol tartrate 25 mg twice daily (new) --increase hydralazine to 100 mg TID due to elevated BP --cont spironolactone 25 mg daily (new)  Acute hypoxemic respiratory failure --O2 sats 86% on RA, placed on 2L [place]. --weaned to RA on 5/12  # CAD s/p CABG and multiple prior PCI's  History of stable angina with plan for LHC today, however patient was unable to lay flat.  --Chest pain-free at rest, EKGs nonacute, troponins negative.  --cont plavix and statin  Paroxysmal atrial fibrillation (HCC) Per chart review, patient has had atrial fibrillation for a while now, however does not appear that anticoagulation has been initiated.  CHA2DS2-VASc is elevated at 6.  No contraindications at this time.  Started on heparin gtt by cardio, transitioned to Eliquis Plan: --cont Eliquis for stroke ppx --cont metop (new)  Chronic venous stasis Bilateral woody  appearance of lower extremities consistent with chronic venous stasis.  Patient states that lower extremity edema below the knees is unchanged compared to prior and has been this way for years, however I do suspect that they are slightly worsened given significant hypervolemia   --apply "dry boot" by RN after discussing with wound care RN --dressing change daily per order  OSA (obstructive sleep apnea) - CPAP at bedtime  Essential hypertension Since admission, patient has been started on Lasix infusion, metoprolol and nitroglycerin.  At home, he was previously on amlodipine, hydralazine, HCTZ, irbesartan, labetalol.  He remains hypertensive above goal at this time.  --cont Lopressor  --increase hydralazine to 100 mg TID due to elevated BP --cont nitro paste, per cardio --cont aldactone (new)  Insulin dependent type 2 diabetes mellitus (HCC) --A1c 5.8, well controlled - SSI, moderate --cont glargine 10u nightly  Non-healing wound of left ear --need outpatient dermatology eval   DVT prophylaxis: [id number] Code Status: Full code  Family Communication:  Level of care: Telemetry Cardiac Dispo:   The patient is from: home Anticipated d/c is to: home Anticipated d/c date is: 2-3 days   Subjective and Interval History:  Pt reported leg swelling improved.  Continues to put out large amount of urine.   Objective: Vitals:   12/24/22 0400 12/24/22 0743 12/24/22 1135 12/24/22 1556  BP: (!) 168/84 (!) 152/75 (!) 143/61 (!) 172/75  Pulse: 66 70 63 (!) 59  Resp: 16 18 20 18   Temp: 98.2 F (36.8 C) 98.9 F (37.2 C) 99 F (37.2 C) 97.9 F (36.6 C)  TempSrc: Oral Oral Oral Oral  SpO2: 95%  93% 99% 95%  Weight:      Height:        Intake/Output Summary (Last 24 hours) at 12/24/2022 1859 Last data filed at 12/24/2022 1744 Gross per 24 hour  Intake 720 ml  Output 6200 ml  Net -5480 ml   Filed Weights   12/20/22 1249 12/21/22 0534 12/23/22 0920  Weight: 111.8 kg 111.6 kg 100.1  kg    Examination:   Constitutional: NAD, AAOx3 HEENT: conjunctivae and lids normal, EOMI CV: No cyanosis.   RESP: normal respiratory effort, on RA Extremities: compression wraps over BLE SKIN: warm, dry Neuro: II - XII grossly intact.   Psych: Normal mood and affect.  Appropriate judgement and reason   Data Reviewed: I have personally reviewed labs and imaging studies  Time spent: 35 minutes  Darlin Priestly, MD [facility] If 7PM-7AM, please contact night-coverage 12/24/2022, 6:59 PM

## 2022-12-24 NOTE — Progress Notes (Signed)
Physical Therapy Treatment Patient Details Name: [name] MRN: [id number] DOB: [birth year] Today's Date: 12/24/2022   History of Present Illness 77yoM with a PMH of CAD s/p CABG (LIMA-LAD, SVG - LCX 100% occluded, SVG - PDA s/p PCI (2021) with prior stents, paroxysmal AF (not anticoagulated), HFpEF (severe LVH, mild AS, moderate AI, moderate to severe MR, moderate TR by echo 09/2022) DM2, CKD, OSA, who presented to [facility] for an outpatient left heart cath with Dr. Juliann Pares the morning of 5/9.  He was orthopneic with significant peripheral edema and was ultimately unable to lay flat for the procedure. He was referred to [facility] ED for further evaluation and management of acute on chronic heart failure.    PT Comments    Pt is making good progress and will be discharged from PT caseload. Discussed with mobility specialist and they will resume treatment and progress mobility. Pt is indep with short in room distances and O2 sats at 88% on RA. Pt has made progress since last PT visit. Will sign off at this time.  Recommendations for follow up therapy are one component of a multi-disciplinary discharge planning process, led by the attending physician.  Recommendations may be updated based on patient status, additional functional criteria and insurance authorization.  Follow Up Recommendations       Assistance Recommended at Discharge PRN  Patient can return home with the following A little help with walking and/or transfers   Equipment Recommendations  None recommended by PT    Recommendations for Other Services       Precautions / Restrictions Precautions Precautions: Fall Restrictions Weight Bearing Restrictions: No     Mobility  Bed Mobility               General bed mobility comments: NT, received in recliner    Transfers Overall transfer level: Independent Equipment used: None               General transfer comment: safe technique. Once standing, able to bend  down to electrical outlet and unplug IV pole for mobilization    Ambulation/Gait Ambulation/Gait assistance: Independent Gait Distance (Feet): 80 Feet Assistive device: IV Pole Gait Pattern/deviations: Step-through pattern       General Gait Details: able to turn in tight room and performs obstacle avoidance well. No LOB or SOB symptoms. O2 sats at 88% with exertion on RA   Stairs             Wheelchair Mobility    Modified Rankin (Stroke Patients Only)       Balance Overall balance assessment: Modified Independent                                          Cognition Arousal/Alertness: Awake/alert Behavior During Therapy: WFL for tasks assessed/performed Overall Cognitive Status: Within Functional Limits for tasks assessed                                          Exercises Other Exercises Other Exercises: Pt ambulated to bathroom with independence. Safe technique.    General Comments        Pertinent Vitals/Pain Pain Assessment Pain Assessment: No/denies pain    Home Living  Prior Function            PT Goals (current goals can now be found in the care plan section) Acute Rehab PT Goals Patient Stated Goal: to go home PT Goal Formulation: With patient Time For Goal Achievement: 12/24/22 Potential to Achieve Goals: Good Progress towards PT goals: Goals met/education completed, patient discharged from PT    Frequency           PT Plan Discharge plan needs to be updated;Frequency needs to be updated    Co-evaluation              AM-PAC PT "6 Clicks" Mobility   Outcome Measure  Help needed turning from your back to your side while in a flat bed without using bedrails?: None Help needed moving from lying on your back to sitting on the side of a flat bed without using bedrails?: None Help needed moving to and from a bed to a chair (including a wheelchair)?: None Help  needed standing up from a chair using your arms (e.g., wheelchair or bedside chair)?: None Help needed to walk in hospital room?: A Little Help needed climbing 3-5 steps with a railing? : A Little 6 Click Score: 22    End of Session   Activity Tolerance: Patient tolerated treatment well Patient left: in chair (with CNA in room) Nurse Communication: Mobility status PT Visit Diagnosis: Other abnormalities of gait and mobility (R26.89);Difficulty in walking, not elsewhere classified (R26.2);Muscle weakness (generalized) (M62.81)     Time: 1324-4010 PT Time Calculation (min) (ACUTE ONLY): 10 min  Charges:  $Gait Training: 8-22 mins                     Elizabeth Palau, PT, DPT, GCS [phone]    Jinx Gilden 12/24/2022, 4:23 PM

## 2022-12-25 DIAGNOSIS — I5031 Acute diastolic (congestive) heart failure: Secondary | ICD-10-CM | POA: Diagnosis not present

## 2022-12-25 LAB — BASIC METABOLIC PANEL
Anion gap: 11 (ref 5–15)
BUN: 36 mg/dL — ABNORMAL HIGH (ref 8–23)
CO2: 34 mmol/L — ABNORMAL HIGH (ref 22–32)
Calcium: 10.4 mg/dL — ABNORMAL HIGH (ref 8.9–10.3)
Chloride: 90 mmol/L — ABNORMAL LOW (ref 98–111)
Creatinine, Ser: 1.44 mg/dL — ABNORMAL HIGH (ref 0.61–1.24)
GFR, Estimated: 51 mL/min — ABNORMAL LOW (ref >60)
Glucose, Bld: 131 mg/dL — ABNORMAL HIGH (ref 70–99)
Potassium: 4.1 mmol/L (ref 3.5–5.1)
Sodium: 135 mmol/L (ref 135–145)

## 2022-12-25 LAB — CBC
HCT: 33.9 % — ABNORMAL LOW (ref 39.0–52.0)
Hemoglobin: 10.9 g/dL — ABNORMAL LOW (ref 13.0–17.0)
MCH: 29.2 pg (ref 26.0–34.0)
MCHC: 32.2 g/dL (ref 30.0–36.0)
MCV: 90.9 fL (ref 80.0–100.0)
Platelets: 159 10*3/uL (ref 150–400)
RBC: 3.73 MIL/uL — ABNORMAL LOW (ref 4.22–5.81)
RDW: 16.9 % — ABNORMAL HIGH (ref 11.5–15.5)
WBC: 8.4 10*3/uL (ref 4.0–10.5)
nRBC: 0 % (ref 0.0–0.2)

## 2022-12-25 LAB — GLUCOSE, CAPILLARY
Glucose-Capillary: 142 mg/dL — ABNORMAL HIGH (ref 70–99)
Glucose-Capillary: 229 mg/dL — ABNORMAL HIGH (ref 70–99)
Glucose-Capillary: 158 mg/dL — ABNORMAL HIGH (ref 70–99)
Glucose-Capillary: 202 mg/dL — ABNORMAL HIGH (ref 70–99)

## 2022-12-25 LAB — MAGNESIUM: Magnesium: 2 mg/dL (ref 1.7–2.4)

## 2022-12-25 MED ORDER — NITROGLYCERIN 2 % TD OINT: 1.0000 [in_us] | TOPICAL_OINTMENT | Freq: Three times a day (TID) | TRANSDERMAL | Status: DC | PRN

## 2022-12-25 NOTE — Progress Notes (Signed)
Occupational Therapy Treatment Patient Details Name: [name] MRN: [id number] DOB: 02/02/1946 Today's Date: 12/25/2022   History of present illness 77yoM with a PMH of CAD s/p CABG (LIMA-LAD, SVG - LCX 100% occluded, SVG - PDA s/p PCI (2021) with prior stents, paroxysmal AF (not anticoagulated), HFpEF (severe LVH, mild AS, moderate AI, moderate to severe MR, moderate TR by echo 09/2022) DM2, CKD, OSA, who presented to [facility] for an outpatient left heart cath with Dr. Juliann Pares the morning of 5/9.  He was orthopneic with significant peripheral edema and was ultimately unable to lay flat for the procedure. He was referred to [facility] ED for further evaluation and management of acute on chronic heart failure.   OT comments  Pt seen for OT tx this date. Pt denies complaints, endorses feeling much better over the past couple days. Pt indep with in-room mobility and IV pole management. Pt completed all aspects of LB bathing and all UB bathing except washing back which he required assist for from standing at the sink with supervision for safety. Pt denied SOB and endorsed 4/10 RPE after washing up. MIN A for gown mgt. Pt notes he typically has a long handled sponge brush he uses to wash his back. Pt educated in ECS including activity pacing and routines modifications to optimize safety/indep while ramping back up into normal ADL/IADL routines. Pt verbalized understanding. Pt has met all OT goals and will be discharged from OT services while hospitalized. Pt in agreement, appreciative of therapy involvement in his care.    Recommendations for follow up therapy are one component of a multi-disciplinary discharge planning process, led by the attending physician.  Recommendations may be updated based on patient status, additional functional criteria and insurance authorization.    Assistance Recommended at Discharge PRN  Patient can return home with the following      Equipment Recommendations  Other (comment)  (HH shower head)    Recommendations for Other Services      Precautions / Restrictions Precautions Precautions: Fall Restrictions Weight Bearing Restrictions: No       Mobility Bed Mobility               General bed mobility comments: NT, received in recliner    Transfers Overall transfer level: Independent Equipment used: None               General transfer comment: Pt managed IV pole independently for in-room mobility and transfers without deficits appreciated     Balance Overall balance assessment: Modified Independent                                         ADL either performed or assessed with clinical judgement   ADL                                         General ADL Comments: Pt completed all aspects of LB bathing and all UB bathing except washing back which he required assist for from standing at the sink with supervision for safety. Pt denied SOB and endorsed 4/10 RPE after washing up. MIN A for gown mgt.    Extremity/Trunk Assessment              Vision       Perception  Praxis      Cognition Arousal/Alertness: Awake/alert Behavior During Therapy: WFL for tasks assessed/performed Overall Cognitive Status: Within Functional Limits for tasks assessed                                          Exercises Other Exercises Other Exercises: Pt educated in activity pacing and other ECS to maximize safety and gradual return to normal ADL/IADL routines at home.    Shoulder Instructions       General Comments      Pertinent Vitals/ Pain       Pain Assessment Pain Assessment: No/denies pain  Home Living                                          Prior Functioning/Environment              Frequency  Min 1X/week        Progress Toward Goals  OT Goals(current goals can now be found in the care plan section)  Progress towards OT goals: Goals  met/education completed, patient discharged from OT  Acute Rehab OT Goals Patient Stated Goal: return to PLOF OT Goal Formulation: All assessment and education complete, DC therapy  Plan All goals met and education completed, patient discharged from OT services    Co-evaluation                 AM-PAC OT "6 Clicks" Daily Activity     Outcome Measure   Help from another person eating meals?: None Help from another person taking care of personal grooming?: None Help from another person toileting, which includes using toliet, bedpan, or urinal?: None Help from another person bathing (including washing, rinsing, drying)?: A Little Help from another person to put on and taking off regular upper body clothing?: None Help from another person to put on and taking off regular lower body clothing?: A Little 6 Click Score: 22    End of Session    OT Visit Diagnosis: Other abnormalities of gait and mobility (R26.89)   Activity Tolerance Patient tolerated treatment well   Patient Left in chair;with call bell/phone within reach   Nurse Communication          Time: 1330-1350 OT Time Calculation (min): 20 min  Charges: OT General Charges $OT Visit: 1 Visit OT Treatments $Self Care/Home Management : 8-22 mins  Arman Filter., MPH, MS, OTR/L ascom [phone] 12/25/22, 2:20 PM

## 2022-12-25 NOTE — Progress Notes (Signed)
[facility] CLINIC CARDIOLOGY CONSULT NOTE       Patient ID: [name] MRN: [id number] DOB/AGE: [birth year] 77 y.o.  Admit date: 12/20/2022 Referring Physician Dr. Artis Delay Primary Physician  Primary Cardiologist Dr. Juliann Pares Reason for Consultation AoCHF  HPI: [name]. Gish is a 20yoM with a PMH of CAD s/p CABG (LIMA-LAD, SVG - LCX 100% occluded, SVG - PDA s/p PCI (2021) with prior stents, paroxysmal AF (not anticoagulated), HFpEF (severe LVH, mild AS, moderate AI, moderate to severe MR, moderate TR by echo 09/2022) DM2, CKD, OSA, who presented to [facility] for an outpatient left heart cath with Dr. Juliann Pares the morning of 5/9.  He was orthopneic with significant peripheral edema and was ultimately unable to lay flat for the procedure. He was referred to [facility] ED for further evaluation and management of acute on chronic heart failure. Echo resulted with fortunately preserved LVEF, mild to moderate left atrial dilation, moderate MR, and mild to moderate aortic stenosis, mild TR  Interval history: -Continues to "feel better and better each day." -Remains on room air, still large volume of urine output yesterday (3.8 L) -Denies chest pain or dyspnea -In AF on telemetry, rate controlled without pauses or bradycardia - Net IO Since Admission: -40,981.64 mL [12/25/22 1512]  - weight down from to 246lbs on admission to 209lbs today  Review of systems complete and found to be negative unless listed above     Past Medical History:  Diagnosis Date   Anemia    Arthritis    Atrial fibrillation (HCC)    AV block, 1st degree    Bundle branch block    Chronic kidney disease    patient not aware of this dx   Coronary artery disease    Diabetes mellitus without complication (HCC)    type 2   Dyspnea    GERD (gastroesophageal reflux disease)    History of blood transfusion    Hypertension    Sleep apnea    does not use cpap    Past Surgical History:  Procedure Laterality Date    APPENDECTOMY     CARDIAC CATHETERIZATION Left 08/16/2016   Procedure: Left Heart Cath and Coronary Angiography;  Surgeon: Alwyn Pea, MD;  Location: [facility] INVASIVE CV LAB;  Service: Cardiovascular;  Laterality: Left;   COLON SURGERY     part of colon removed   COLONOSCOPY N/A 09/07/2020   Procedure: COLONOSCOPY;  Surgeon: Pasty Spillers, MD;  Location: [facility] ENDOSCOPY;  Service: Endoscopy;  Laterality: N/A;   COLONOSCOPY WITH PROPOFOL N/A 05/17/2021   Procedure: COLONOSCOPY WITH PROPOFOL;  Surgeon: Pasty Spillers, MD;  Location: [facility] ENDOSCOPY;  Service: Endoscopy;  Laterality: N/A;   CORONARY ARTERY BYPASS GRAFT  08/30/2016   triple   CORONARY STENT INTERVENTION N/A 06/09/2020   Procedure: CORONARY STENT INTERVENTION;  Surgeon: Alwyn Pea, MD;  Location: [facility] INVASIVE CV LAB;  Service: Cardiovascular;  Laterality: N/A;   ENDOSCOPIC MUCOSAL RESECTION N/A 09/22/2020   Procedure: ENDOSCOPIC MUCOSAL RESECTION;  Surgeon: Meridee Score Netty Starring., MD;  Location: [facility] ENDOSCOPY;  Service: Gastroenterology;  Laterality: N/A;   ESOPHAGOGASTRODUODENOSCOPY N/A 09/07/2020   Procedure: ESOPHAGOGASTRODUODENOSCOPY (EGD);  Surgeon: Pasty Spillers, MD;  Location: [facility] ENDOSCOPY;  Service: Endoscopy;  Laterality: N/A;   ESOPHAGOGASTRODUODENOSCOPY (EGD) WITH PROPOFOL N/A 09/22/2020   Procedure: ESOPHAGOGASTRODUODENOSCOPY (EGD) WITH PROPOFOL;  Surgeon: Meridee Score Netty Starring., MD;  Location: [facility] ENDOSCOPY;  Service: Gastroenterology;  Laterality: N/A;   FRACTURE SURGERY     HEMOSTASIS CLIP PLACEMENT  09/22/2020  Procedure: HEMOSTASIS CLIP PLACEMENT;  Surgeon: Lemar Lofty., MD;  Location: [facility] ENDOSCOPY;  Service: Gastroenterology;;   HEMOSTASIS CONTROL  09/22/2020   Procedure: HEMOSTASIS CONTROL;  Surgeon: Lemar Lofty., MD;  Location: [facility] ENDOSCOPY;  Service: Gastroenterology;;   LEFT HEART CATH AND CORONARY ANGIOGRAPHY Left 06/09/2020   Procedure: LEFT HEART CATH  AND CORONARY ANGIOGRAPHY;  Surgeon: Alwyn Pea, MD;  Location: [facility] INVASIVE CV LAB;  Service: Cardiovascular;  Laterality: Left;   LEFT HEART CATH AND CORS/GRAFTS ANGIOGRAPHY Left 11/20/2016   Procedure: Left Heart Cath and Cors/Grafts Angiography;  Surgeon: Alwyn Pea, MD;  Location: [facility] INVASIVE CV LAB;  Service: Cardiovascular;  Laterality: Left;   POLYPECTOMY  09/22/2020   Procedure: POLYPECTOMY;  Surgeon: Mansouraty, Netty Starring., MD;  Location: [facility] ENDOSCOPY;  Service: Gastroenterology;;   SUBMUCOSAL LIFTING INJECTION  09/22/2020   Procedure: SUBMUCOSAL LIFTING INJECTION;  Surgeon: Lemar Lofty., MD;  Location: [facility] ENDOSCOPY;  Service: Gastroenterology;;    Medications Prior to Admission  Medication Sig Dispense Refill Last Dose   amLODipine (NORVASC) 10 MG tablet Take 10 mg by mouth daily.   Past Week   aspirin EC 81 MG tablet Take 81 mg by mouth daily. Swallow whole.   Past Week   clopidogrel (PLAVIX) 75 MG tablet Take 75 mg by mouth daily.   Past Week   dexlansoprazole (DEXILANT) 60 MG capsule Take 1 capsule by mouth daily.   Past Week   hydrALAZINE (APRESOLINE) 50 MG tablet Take 1 tablet by mouth 3 (three) times daily.   Past Week   hydrochlorothiazide (HYDRODIURIL) 12.5 MG tablet Take 12.5 mg by mouth daily.   Past Week   insulin glargine (LANTUS) 100 UNIT/ML injection Inject 25 Units into the skin at bedtime.    Past Week   irbesartan (AVAPRO) 300 MG tablet Take 1 tablet (300 mg total) by mouth daily. 30 tablet 1 Past Week   labetalol (NORMODYNE) 100 MG tablet Take 1 tablet by mouth 2 (two) times daily.   Past Week   metFORMIN (GLUCOPHAGE-XR) 500 MG 24 hr tablet Take 500 mg by mouth 2 (two) times daily.   Past Week   rosuvastatin (CRESTOR) 40 MG tablet Take 40 mg by mouth daily.   Past Week   sitaGLIPtin (JANUVIA) 100 MG tablet Take 100 mg by mouth daily.   Past Week   Testosterone 1.62 % GEL Apply 2 Pump topically daily. One pump on each arm 225 g 3 Past  Week   acetaminophen (TYLENOL) 325 MG tablet Take 2 tablets (650 mg total) by mouth every 6 (six) hours as needed for mild pain (or Fever >/= 101). (Patient not taking: Reported on 08/21/2022)      nebivolol (BYSTOLIC) 10 MG tablet Take 10 mg by mouth daily. (Patient not taking: Reported on 12/20/2022)   Not Taking   torsemide (DEMADEX) 20 MG tablet Take 20 mg by mouth 2 (two) times daily.       Social History   Socioeconomic History   Marital status: Married    Spouse name: Not on file   Number of children: Not on file   Years of education: Not on file   Highest education level: Not on file  Occupational History   Not on file  Tobacco Use   Smoking status: Former    Types: Cigarettes   Smokeless tobacco: Never  Vaping Use   Vaping Use: Never used  Substance and Sexual Activity   Alcohol use: No   Drug use: No  Sexual activity: Not Currently  Other Topics Concern   Not on file  Social History Narrative   Not on file   Social Determinants of Health   Financial Resource Strain: Not on file  Food Insecurity: Not on file  Transportation Needs: Not on file  Physical Activity: Not on file  Stress: Not on file  Social Connections: Not on file  Intimate Partner Violence: Not on file    History reviewed. No pertinent family history.    Intake/Output Summary (Last 24 hours) at 12/25/2022 1512 Last data filed at 12/25/2022 1127 Gross per 24 hour  Intake 830 ml  Output 4355 ml  Net -3525 ml    Vitals:   12/25/22 0346 12/25/22 0810 12/25/22 1013 12/25/22 1119  BP: (!) 149/77 136/69  126/61  Pulse: (!) 57 60  66  Resp: 20 20  (!) 21  Temp: 98.2 F (36.8 C) 97.9 F (36.6 C)  98.4 F (36.9 C)  TempSrc: Oral Oral  Oral  SpO2: 94% 90%  96%  Weight:   94.9 kg   Height:        PHYSICAL EXAM General: Pleasant ill-appearing, Caucasian male, upright in bed  HEENT:  Normocephalic and atraumatic.  Chronic appearing crusted lesion present on left ear Neck:  No JVD.  Lungs:  Normal respiratory effort on oxygen by room air, trace right basilar crackles without appreciable wheezes.   Heart: Irregularly irregular with controlled rate.  S1-S2 present 3/6 systolic murmur heard throughout Abdomen: Soft, nontender, mildly distended with reducible umbilical hernia, nontender to palpation Msk: Normal strength and tone for age. Extremities: Both lower extremities with compression wrappings, 1+ edema to upper calf, overall significant clinical improvement. Neuro: Alert and oriented X 3. Psych:  Answers questions appropriately.   Labs: Basic Metabolic Panel: Recent Labs    12/24/22 0558 12/25/22 0512  NA 134* 135  K 3.9 4.1  CL 90* 90*  CO2 33* 34*  GLUCOSE 156* 131*  BUN 29* 36*  CREATININE 1.37* 1.44*  CALCIUM 10.4* 10.4*  MG 2.0 2.0   Liver Function Tests: No results for input(s): "AST", "ALT", "ALKPHOS", "BILITOT", "PROT", "ALBUMIN" in the last 72 hours.  No results for input(s): "LIPASE", "AMYLASE" in the last 72 hours. CBC: Recent Labs    12/24/22 0558 12/25/22 0512  WBC 8.5 8.4  HGB 11.3* 10.9*  HCT 35.0* 33.9*  MCV 91.6 90.9  PLT 137* 159   Cardiac Enzymes: No results for input(s): "CKTOTAL", "CKMB", "CKMBINDEX", "TROPONINIHS" in the last 72 hours.  BNP: No results for input(s): "BNP" in the last 72 hours.  D-Dimer: No results for input(s): "DDIMER" in the last 72 hours. Hemoglobin A1C: No results for input(s): "HGBA1C" in the last 72 hours.  Fasting Lipid Panel: No results for input(s): "CHOL", "HDL", "LDLCALC", "TRIG", "CHOLHDL", "LDLDIRECT" in the last 72 hours.  Thyroid Function Tests: No results for input(s): "TSH", "T4TOTAL", "T3FREE", "THYROIDAB" in the last 72 hours.  Invalid input(s): "FREET3" Anemia Panel: No results for input(s): "VITAMINB12", "FOLATE", "FERRITIN", "TIBC", "IRON", "RETICCTPCT" in the last 72 hours.   Radiology: ECHOCARDIOGRAM COMPLETE  Result Date: 12/23/2022    ECHOCARDIOGRAM REPORT   Patient Name:    LUDDIE HEBELER [facility] Date of Exam: 12/23/2022 Medical Rec #:  [id number]        Height:       73.0 in Accession #:    [id number]       Weight:       220.6 lb Date of Birth:  July 18, 1946  BSA:          2.243 m Patient Age:    77 years         BP:           147/68 mmHg Patient Gender: M                HR:           49 bpm. Exam Location:  [facility] Procedure: 2D Echo, Color Doppler and Cardiac Doppler Indications:     CHF--acute systolic I50.21  History:         Patient has no prior history of Echocardiogram examinations.                  Arrythmias:Atrial Fibrillation; Risk Factors:Diabetes,                  Hypertension and Sleep Apnea.  Sonographer:     Cristela Blue Referring Phys:  [id number] [facility] MICHELLE Ahmaya Ostermiller Diagnosing Phys: Marcina Millard MD IMPRESSIONS  1. Left ventricular ejection fraction, by estimation, is 60 to 65%. The left ventricle has normal function. The left ventricle has no regional wall motion abnormalities. Left ventricular diastolic parameters were normal.  2. Right ventricular systolic function is normal. The right ventricular size is normal.  3. Left atrial size was mild to moderately dilated.  4. The mitral valve is normal in structure. Moderate mitral valve regurgitation. No evidence of mitral stenosis.  5. The aortic valve is normal in structure. Aortic valve regurgitation is not visualized. Mild to moderate aortic valve stenosis.  6. The inferior vena cava is normal in size with greater than 50% respiratory variability, suggesting right atrial pressure of 3 mmHg. FINDINGS  Left Ventricle: Left ventricular ejection fraction, by estimation, is 60 to 65%. The left ventricle has normal function. The left ventricle has no regional wall motion abnormalities. The left ventricular internal cavity size was normal in size. There is  no left ventricular hypertrophy. Left ventricular diastolic parameters were normal. Right Ventricle: The right ventricular size is normal. No increase in right ventricular  wall thickness. Right ventricular systolic function is normal. Left Atrium: Left atrial size was mild to moderately dilated. Right Atrium: Right atrial size was normal in size. Pericardium: There is no evidence of pericardial effusion. Mitral Valve: The mitral valve is normal in structure. Moderate mitral valve regurgitation. No evidence of mitral valve stenosis. MV peak gradient, 7.2 mmHg. The mean mitral valve gradient is 2.0 mmHg. Tricuspid Valve: The tricuspid valve is normal in structure. Tricuspid valve regurgitation is mild . No evidence of tricuspid stenosis. Aortic Valve: The aortic valve is normal in structure. Aortic valve regurgitation is not visualized. Mild to moderate aortic stenosis is present. Aortic valve mean gradient measures 15.0 mmHg. Aortic valve peak gradient measures 25.6 mmHg. Aortic valve area, by VTI measures 1.63 cm. Pulmonic Valve: The pulmonic valve was normal in structure. Pulmonic valve regurgitation is not visualized. No evidence of pulmonic stenosis. Aorta: The aortic root is normal in size and structure. Venous: The inferior vena cava is normal in size with greater than 50% respiratory variability, suggesting right atrial pressure of 3 mmHg. IAS/Shunts: No atrial level shunt detected by color flow Doppler.  LEFT VENTRICLE PLAX 2D LVIDd:         3.60 cm   Diastology LVIDs:         2.30 cm   LV e' medial:    6.42 cm/s LV PW:  1.90 cm   LV E/e' medial:  20.9 LV IVS:        1.80 cm   LV e' lateral:   9.25 cm/s LVOT diam:     2.00 cm   LV E/e' lateral: 14.5 LV SV:         86 LV SV Index:   38 LVOT Area:     3.14 cm  RIGHT VENTRICLE RV Basal diam:  5.00 cm RV Mid diam:    3.50 cm RV S prime:     13.70 cm/s TAPSE (M-mode): 2.5 cm LEFT ATRIUM             Index        RIGHT ATRIUM           Index LA diam:        4.90 cm 2.18 cm/m   RA Area:     21.20 cm LA Vol (A2C):   27.6 ml 12.30 ml/m  RA Volume:   64.70 ml  28.84 ml/m LA Vol (A4C):   57.9 ml 25.81 ml/m LA Biplane Vol:  43.3 ml 19.30 ml/m  AORTIC VALVE AV Area (Vmax):    1.53 cm AV Area (Vmean):   1.55 cm AV Area (VTI):     1.63 cm AV Vmax:           253.00 cm/s AV Vmean:          180.667 cm/s AV VTI:            0.525 m AV Peak Grad:      25.6 mmHg AV Mean Grad:      15.0 mmHg LVOT Vmax:         123.00 cm/s LVOT Vmean:        89.100 cm/s LVOT VTI:          0.273 m LVOT/AV VTI ratio: 0.52  AORTA Ao Root diam: 3.20 cm MITRAL VALVE                TRICUSPID VALVE MV Area (PHT): 4.06 cm     TR Peak grad:   28.3 mmHg MV Area VTI:   2.69 cm     TR Vmax:        266.00 cm/s MV Peak grad:  7.2 mmHg MV Mean grad:  2.0 mmHg     SHUNTS MV Vmax:       1.34 m/s     Systemic VTI:  0.27 m MV Vmean:      70.8 cm/s    Systemic Diam: 2.00 cm MV Decel Time: 187 msec MV E velocity: 134.00 cm/s MV A velocity: 70.70 cm/s MV E/A ratio:  1.90 Marcina Millard MD Electronically signed by Marcina Millard MD Signature Date/Time: 12/23/2022/3:18:55 PM    Final    US Venous Img Lower Bilateral  Result Date: 12/20/2022 CLINICAL DATA:  Bilateral lower extremity swelling and pain EXAM: BILATERAL LOWER EXTREMITY VENOUS DOPPLER ULTRASOUND TECHNIQUE: Gray-scale sonography with graded compression, as well as color Doppler and duplex ultrasound were performed to evaluate the lower extremity deep venous systems from the level of the common femoral vein and including the common femoral, femoral, profunda femoral, popliteal and calf veins including the posterior tibial, peroneal and gastrocnemius veins when visible. Spectral Doppler was utilized to evaluate flow at rest and with distal augmentation maneuvers in the common femoral, femoral and popliteal veins. COMPARISON:  None Available. FINDINGS: RIGHT LOWER EXTREMITY Common Femoral Vein: No evidence of thrombus. Normal compressibility, respiratory phasicity and response to  augmentation. Saphenofemoral Junction: No evidence of thrombus. Normal compressibility and flow on color Doppler imaging. Profunda  Femoral Vein: No evidence of thrombus. Normal compressibility and flow on color Doppler imaging. Femoral Vein: No evidence of thrombus. Normal compressibility, respiratory phasicity and response to augmentation. Popliteal Vein: No evidence of thrombus. Normal compressibility, respiratory phasicity and response to augmentation. Calf Veins: No evidence of thrombus. Normal compressibility and flow on color Doppler imaging. LEFT LOWER EXTREMITY Common Femoral Vein: No evidence of thrombus. Normal compressibility, respiratory phasicity and response to augmentation. Saphenofemoral Junction: No evidence of thrombus. Normal compressibility and flow on color Doppler imaging. Profunda Femoral Vein: No evidence of thrombus. Normal compressibility and flow on color Doppler imaging. Femoral Vein: No evidence of thrombus. Normal compressibility, respiratory phasicity and response to augmentation. Popliteal Vein: No evidence of thrombus. Normal compressibility, respiratory phasicity and response to augmentation. Calf Veins: No evidence of thrombus. Normal compressibility and flow on color Doppler imaging. Other Findings: Benign-appearing fatty replaced elongated lymph nodes noted bilaterally. Extremity subcutaneous edema present bilaterally. IMPRESSION: No evidence of deep venous thrombosis in either lower extremity. Electronically Signed   By: Judie Petit.  Shick M.D.   On: 12/20/2022 14:38   DG Chest Portable 1 View  Result Date: 12/20/2022 CLINICAL DATA:  Shortness of breath EXAM: PORTABLE CHEST 1 VIEW COMPARISON:  X-ray 08/21/2020 FINDINGS: Underinflation with bilateral small pleural effusions, new from previous. Adjacent opacity. Vascular congestion. No pneumothorax. Status post median sternotomy. The heart is enlarged. Overlapping cardiac leads. IMPRESSION: Postop chest. Enlarged cardiopericardial silhouette with vascular congestion and small effusions. Adjacent lung opacities. Recommend follow-up Electronically Signed   By: Karen Kays M.D.   On: 12/20/2022 13:37    LHC 2021  LM lesion is 40% stenosed. Prox RCA lesion is 99% stenosed. Mid RCA lesion is 75% stenosed. Dist RCA lesion is 95% stenosed. RPDA lesion is 90% stenosed. SVG and is normal in caliber. The flow is not reversed. There is no competitive flow SVG and is normal in caliber. The flow is not reversed. There is no competitive flow Prox Graft lesion is 100% stenosed. LIMA and is normal in caliber. The flow is not reversed. There is no competitive flow Origin lesion is 95% stenosed. Previously placed Prox Cx to Dist Cx stent (unknown type) is widely patent. Dist Cx lesion is 90% stenosed. Previously placed Ost LAD to Mid LAD stent (unknown type) is widely patent. Origin to Prox Graft lesion is 100% stenosed. A drug-eluting stent was successfully placed using a STENT RESOLUTE ONYX 2.5X18. Post intervention, there is a 0% residual stenosis. Ost Cx lesion is 70% stenosed. Dist LAD-1 lesion is 50% stenosed. Previously placed Dist LAD-2 stent (unknown type) is widely patent. Ost LAD lesion is 75% stenosed. Preserved left ventricular function ejection fraction of 60%   Conclusion Normal left ventricular function 60% Coronaries Left main large calcified moderate lesion moderate to large calcium Distal LAD large ostial lesion 75% moderate calcification proximal LAD stent widely patent mid LAD stent widely patent distal LAD diffuse 50% Circumflex is large 70% ostial mid stent widely patent distal circumflex 95% TIMI-3 flow RCA medium proximal 90% lesion 90% mid distal 75 Grafts LIMA to LAD incompletely evaluated probably occluded SVG to circumflex 100% occluded SVG to PDA 95% ostial in-stent TIMI-3 flow Intervention Successful PCI and stent to in-stent restenosis ostially with 2.5 x 18 mm resolute Onyx to 20 atm Lesion reduced from 95 down to 0% TIMI-3 flow maintained throughout   Successful PCI and stent ostial SVG in-stent  restenosis. Lesion reduced from 95 down to 0%  ECHO  NORMAL LEFT VENTRICULAR SYSTOLIC FUNCTION   WITH SEVERE LVH  NORMAL RIGHT VENTRICULAR SYSTOLIC FUNCTION  MODERATE VALVULAR REGURGITATION (See above)  MILD VALVULAR STENOSIS (See above)  IRREGULAR HEART RHYTHM CAPTURED THROUGHOUT EXAM  ESTIMATED LVEF >55%  MODERATE AI  MILD AS; Max Velocity: 3.4m/s; AVA: 1.1cm^2  (PREV. ECHO: 2.78m/s; AVA: 1.9cm^2)  MODERATE - SEVERE MR (MVP: MILD - AMVL)  MODERATE TR; SEVERE PHTN  SEVERE LAE; MILD RAE  MILDLY DILATED ASCENDING AORTA MEASURING UP TO 3.7cm  PATIENT IMAGED SUPINE FROM APICALS TO PEDOFF TO SUBCOSTAL; PATIENT HAD DIFFICULTY LAYING  DOWN; FURTHER EVALUATION DID NOT SHOW ANY SIGNS OF TAMPONADE  _________________________________________________________________________________________  Electronically signed by    Dorothyann Peng, MD on 09/28/2022 07: 54 AM           Performed By: Verdis Prime     Ordering Physician: Dorothyann Peng, MD   TELEMETRY reviewed by me (LT) 12/25/2022 : AF rate 60s to 70s without pauses or bradycardia  EKG reviewed by me: AF RBBB and LAFB rate 70  Data reviewed by me (LT) 12/25/2022: Hospitalist progress note, nursing notes last 24h vitals tele labs imaging I/O    Principal Problem:   Acute heart failure with preserved ejection fraction (HFpEF) (HCC) Active Problems:   Insulin dependent type 2 diabetes mellitus (HCC)   Essential hypertension   OSA (obstructive sleep apnea)   CAD (coronary artery disease), native coronary artery   Chronic venous stasis   Paroxysmal atrial fibrillation (HCC)    ASSESSMENT AND PLAN:  Normand Sloop P. Rudisill is a 56yoM with a PMH of CAD s/p CABG (LIMA-LAD, SVG - LCX 100% occluded, SVG - PDA s/p PCI (2021) with prior stents, paroxysmal AF (not anticoagulated), HFpEF (severe LVH, mild AS, moderate AI, moderate to severe MR, moderate TR by echo 09/2022) DM2, CKD, OSA, who presented to [facility] for an outpatient left heart cath with Dr.  Juliann Pares the morning of 5/9.  He was orthopneic with significant peripheral edema and was ultimately unable to lay flat for the procedure. He was referred to [facility] ED for further evaluation and management of acute on chronic heart failure.  # Acute on chronic HFpEF # Moderate to severe MR # Mild-moderate AS, no AI Presents with 2 months of progressively worsening shortness of breath and exertional chest pain for which LHC was initially recommended by Dr. Juliann Pares.  Over the past 2 weeks his peripheral edema, orthopnea, and dyspnea have significantly worsened, refractory to outpatient torsemide. On admission he was massively volume overloaded on exam with tense woody edema with weeping from wounds on his legs, excellent diuresis on a Lasix drip with Net IO Since Admission: -16,109.64 mL [12/25/22 1512].  Echo resulted with preserved LVEF, moderate MR, mild to moderate AS without aortic insufficiency visualized -Continue Lasix infusion at 10 mg/h for today, will consider switching to p.o. diuretics tomorrow if urine output starts to slow.  - Monitor and replenish electrolytes for a goal K >4, mag >2 -change Nitropaste to PRN. Cough with PO nitrates -Continue metoprolol tartrate 25 mg twice daily with hold parameters for HR <60 -Continue hydralazine 100 mg 3 times daily -Continue spironolactone to 25 mg daily  -Further additions of ARB, SGLT2i pending stability of his renal function. -Discussed low-salt diet / alternatives which the patient is motivated to adhere to  # CAD s/p CABG and multiple prior PCI's Chest pain-free at rest, EKGs nonacute, troponins negative.     No  immediate plan for invasive cardiac diagnostics.  Continue management of his heart failure as above -Will defer LHC during this hospitalization, plan to reassess at outpatient hospital follow up appointment.  -Continue Plavix 75 mg daily -Drop aspirin 81 mg daily as Eliquis started.  # Paroxysmal atrial fibrillation # Pauses Rate  60s-70s without pauses today.  Not historically anticoagulated prior to this admission for unknown reasons.  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   -Continue telemetry monitoring while inpatient -Will continue metoprolol for rate control -Continue rate control, echo with some LA remodeling suggesting that rhythm control may be difficult to achieve -After discussion of risks/benefits on 5/10, patient is agreeable to starting a DOAC at discharge with close monitoring for bleeding side effects and H&H. CHA2DS2-VASc 5 (age, CHF, HTN, CAD)  -Continue Eliquis 5mg  twice daily for stroke prevention.    This patient's plan of care was discussed and created with Dr. Darrold Junker and he is in agreement.  Signed: Rebeca Allegra , PA-C 12/25/2022, 3:12 PM [facility]

## 2022-12-25 NOTE — Progress Notes (Signed)
PROGRESS NOTE    [name]  ZOX:[id number] DOB: 10-28-1945 DOA: 12/20/2022 PCP: Jaclyn Shaggy, MD  250A/250A-AA  LOS: 5 days   Brief hospital course:   Assessment & Plan: [name] is a 77 y.o. male with medical history significant of CAD s/p CABG and DES, severe pulmonary hypertension, hypertension, hyperlipidemia, OSA, type 2 diabetes, hypothyroidism, who presented to the ED due to shortness of breath.     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  He had initially come to the hospital for a left heart cath but was unable to lay flat.  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  Cardiology has been consulted and initiated Lasix drip. Plan: --cont Lasix gtt --d/c nitro paste today -cont metoprolol tartrate 25 mg twice daily (new) --cont hydralazine to 100 mg TID  --cont spironolactone 25 mg daily (new) -Further additions of ARB, SGLT2i pending stability of his renal function.   Acute hypoxemic respiratory failure --O2 sats 86% on RA, placed on 2L [place]. --weaned to RA on 5/12  # CAD s/p CABG and multiple prior PCI's  History of stable angina with plan for LHC today, however patient was unable to lay flat.  --Chest pain-free at rest, EKGs nonacute, troponins negative.  --cont plavix and statin --d/c home ASA now that pt is started on Eliquis  Paroxysmal atrial fibrillation (HCC) Per chart review, patient has had atrial fibrillation for a while now, however does not appear that anticoagulation has been initiated.  CHA2DS2-VASc is elevated at 6.  No contraindications at this time.  Started on heparin gtt by cardio, transitioned to Eliquis Plan: --cont Eliquis for stroke ppx --cont metop  (new)  Chronic venous stasis Bilateral woody appearance of lower extremities consistent with chronic venous stasis.  Patient states that lower extremity edema below the knees is unchanged compared to prior and has been this way for years, however I do suspect that they are slightly worsened given significant hypervolemia   --apply "dry boot" by RN after discussing with wound care RN --dressing change daily per order  OSA (obstructive sleep apnea) - CPAP at bedtime  Essential hypertension Since admission, patient has been started on Lasix infusion, metoprolol and nitroglycerin.  At home, he was previously on amlodipine, hydralazine, HCTZ, irbesartan, labetalol.  He remains hypertensive above goal at this time.  --cont Lopressor  --cont hydralazine to 100 mg TID --d/c nitro paste today --cont aldactone (new)  Insulin dependent type 2 diabetes mellitus (HCC) --A1c 5.8, well controlled - SSI, moderate --cont glargine 10u nightly  Non-healing wound of left ear --need outpatient dermatology eval   DVT prophylaxis: [id number] Code Status: Full code  Family Communication:  Level of care: Telemetry Cardiac Dispo:   The patient is from: home Anticipated d/c is to: home Anticipated d/c date is: 2-3 days.  On lasix gtt.   Subjective and Interval History:  Pt reported doing well, leg swelling improved, still having great urine output.   Objective: Vitals:   12/25/22 1013 12/25/22 1119 12/25/22 1620 12/25/22 2024  BP:  126/61 (!) 141/69 (!) 146/79  Pulse:  66 64 70  Resp:  (!) 21 18 18   Temp:  98.4 F (36.9 C) 98.3 F (36.8 C) 98.2 F (36.8 C)  TempSrc:  Oral Oral   SpO2:  96% 98% 93%  Weight: 94.9 kg     Height:        Intake/Output Summary (Last 24 hours) at 12/25/2022 2056 Last data filed at 12/25/2022 1921 Gross per 24 hour  Intake 1070 ml  Output 3705 ml  Net -2635 ml   Filed Weights   12/21/22 0534 12/23/22 0920 12/25/22 1013  Weight: 111.6 kg 100.1 kg 94.9 kg     Examination:   Constitutional: NAD, AAOx3 HEENT: conjunctivae and lids normal, EOMI CV: No cyanosis.   RESP: normal respiratory effort, on RA Extremities: edema in BLE improved, both legs in compression wrap SKIN: warm, dry Neuro: II - XII grossly intact.   Psych: Normal mood and affect.  Appropriate judgement and reason   Data Reviewed: I have personally reviewed labs and imaging studies  Time spent: 35 minutes  Darlin Priestly, MD [facility] If 7PM-7AM, please contact night-coverage 12/25/2022, 8:56 PM

## 2022-12-25 NOTE — Care Management Important Message (Signed)
Important Message  Patient Details  Name: [name] MRN: [id number] Date of Birth: [birth year]   Medicare Important Message Given:  Yes  Patient asleep upon time of visit, no family in room.  Copy of Medicare IM left on bedside tray for reference.    Johnell Comings 12/25/2022, 11:36 AM

## 2022-12-26 DIAGNOSIS — E559 Vitamin D deficiency, unspecified: Secondary | ICD-10-CM | POA: Insufficient documentation

## 2022-12-26 DIAGNOSIS — I071 Rheumatic tricuspid insufficiency: Secondary | ICD-10-CM | POA: Insufficient documentation

## 2022-12-26 DIAGNOSIS — J9601 Acute respiratory failure with hypoxia: Secondary | ICD-10-CM | POA: Diagnosis not present

## 2022-12-26 DIAGNOSIS — N179 Acute kidney failure, unspecified: Secondary | ICD-10-CM | POA: Diagnosis not present

## 2022-12-26 DIAGNOSIS — E871 Hypo-osmolality and hyponatremia: Secondary | ICD-10-CM | POA: Insufficient documentation

## 2022-12-26 DIAGNOSIS — I272 Pulmonary hypertension, unspecified: Secondary | ICD-10-CM

## 2022-12-26 DIAGNOSIS — I340 Nonrheumatic mitral (valve) insufficiency: Secondary | ICD-10-CM | POA: Insufficient documentation

## 2022-12-26 DIAGNOSIS — I5031 Acute diastolic (congestive) heart failure: Secondary | ICD-10-CM | POA: Diagnosis not present

## 2022-12-26 DIAGNOSIS — I480 Paroxysmal atrial fibrillation: Secondary | ICD-10-CM | POA: Diagnosis not present

## 2022-12-26 LAB — BASIC METABOLIC PANEL
Anion gap: 10 (ref 5–15)
BUN: 44 mg/dL — ABNORMAL HIGH (ref 8–23)
CO2: 34 mmol/L — ABNORMAL HIGH (ref 22–32)
Calcium: 10.8 mg/dL — ABNORMAL HIGH (ref 8.9–10.3)
Chloride: 90 mmol/L — ABNORMAL LOW (ref 98–111)
Creatinine, Ser: 1.63 mg/dL — ABNORMAL HIGH (ref 0.61–1.24)
GFR, Estimated: 44 mL/min — ABNORMAL LOW (ref >60)
Glucose, Bld: 158 mg/dL — ABNORMAL HIGH (ref 70–99)
Potassium: 4.3 mmol/L (ref 3.5–5.1)
Sodium: 134 mmol/L — ABNORMAL LOW (ref 135–145)

## 2022-12-26 LAB — CBC
HCT: 36.5 % — ABNORMAL LOW (ref 39.0–52.0)
Hemoglobin: 11.5 g/dL — ABNORMAL LOW (ref 13.0–17.0)
MCH: 28.8 pg (ref 26.0–34.0)
MCHC: 31.5 g/dL (ref 30.0–36.0)
MCV: 91.3 fL (ref 80.0–100.0)
Platelets: 200 10*3/uL (ref 150–400)
RBC: 4 MIL/uL — ABNORMAL LOW (ref 4.22–5.81)
RDW: 16.7 % — ABNORMAL HIGH (ref 11.5–15.5)
WBC: 9.7 10*3/uL (ref 4.0–10.5)
nRBC: 0 % (ref 0.0–0.2)

## 2022-12-26 LAB — GLUCOSE, CAPILLARY
Glucose-Capillary: 159 mg/dL — ABNORMAL HIGH (ref 70–99)
Glucose-Capillary: 241 mg/dL — ABNORMAL HIGH (ref 70–99)
Glucose-Capillary: 203 mg/dL — ABNORMAL HIGH (ref 70–99)
Glucose-Capillary: 152 mg/dL — ABNORMAL HIGH (ref 70–99)

## 2022-12-26 LAB — MAGNESIUM: Magnesium: 2.1 mg/dL (ref 1.7–2.4)

## 2022-12-26 MED ORDER — VITAMIN D 25 MCG (1000 UNIT) PO TABS
1000.0000 [IU] | ORAL_TABLET | Freq: Every day | ORAL | Status: DC
  Administered 2022-12-26 – 2022-12-27 (×2): 1000 [IU] via ORAL
  Filled 2022-12-26 (×2): qty 1

## 2022-12-26 MED ORDER — TORSEMIDE 20 MG PO TABS
40.0000 mg | ORAL_TABLET | Freq: Two times a day (BID) | ORAL | Status: DC
  Administered 2022-12-26 (×2): 40 mg via ORAL
  Filled 2022-12-26 (×2): qty 2

## 2022-12-26 NOTE — Hospital Course (Signed)
[name] is a 77 y.o. male with medical history significant of CAD s/p CABG and DES, severe pulmonary hypertension, hypertension, hyperlipidemia, OSA, type 2 diabetes, hypothyroidism, who presented to the ED due to shortness of breath.   Recent echocardiogram showed preserved LV systolic function, severe LVH, moderate to severe mitral regurgitation and tricuspid regurgitation and severe pulm hypertension.  Patient was diagnosed with acute on chronic diastolic congestive heart failure, was given IV Lasix drip.  Treatment was switched to torsemide in spironolactone 5/15. Patient renal function slightly worsened today, but short of breath much improved.  Discussed with cardiology, patient still medically stable to be discharged, but hold diuretics for 2 days, hold ARB until next office visit.

## 2022-12-26 NOTE — Progress Notes (Signed)
Mobility Specialist - Progress Note   12/26/22 0953  Mobility  Activity Ambulated independently in hallway  Level of Assistance Independent  Assistive Device Front wheel walker  Distance Ambulated (ft) 180 ft  Activity Response Tolerated well  Mobility Referral Yes  $Mobility charge 1 Mobility  Mobility Specialist Start Time (ACUTE ONLY) 0941  Mobility Specialist Stop Time (ACUTE ONLY) 0950  Mobility Specialist Time Calculation (min) (ACUTE ONLY) 9 min   Pt sitting in recliner on RA upon arrival. Pt STS and ambulates in hallway indep with no LOB noted. Pt returns to recliner with needs in reach.  Terrilyn Saver  Mobility Specialist  12/26/22 9:54 AM

## 2022-12-26 NOTE — Progress Notes (Addendum)
[facility] CLINIC CARDIOLOGY CONSULT NOTE       Patient ID: [name] MRN: [id number] DOB/AGE: Nov 21, 1945 77 y.o.  Admit date: 12/20/2022 Referring Physician Dr. Artis Delay Primary Physician  Primary Cardiologist Dr. Juliann Pares Reason for Consultation AoCHF  HPI: [name] is a 4yoM with a PMH of CAD s/p CABG (LIMA-LAD, SVG - LCX 100% occluded, SVG - PDA s/p PCI (2021) with prior stents, paroxysmal AF (not anticoagulated), HFpEF (severe LVH, mild AS, moderate AI, moderate to severe MR, moderate TR by echo 09/2022) DM2, CKD, OSA, who presented to [facility] for an outpatient left heart cath with Dr. Juliann Pares the morning of 5/9.  He was orthopneic with significant peripheral edema and was ultimately unable to lay flat for the procedure. He was referred to [facility] ED for further evaluation and management of acute on chronic heart failure. Echo resulted with fortunately preserved LVEF, mild to moderate left atrial dilation, moderate MR, and mild to moderate aortic stenosis, mild TR  Interval history: - Patient continues to report improvement in SOB symptoms, denies CP. Remains on RA.  - Has been OOB walking around unit with PT.  - Urine output has slowed but continues to be significant. Net IO Since Admission: -17,566.81 mL [12/26/22 1114]  - Weight down from to 246 lbs on admission to 204 lbs today  Review of systems complete and found to be negative unless listed above    Past Medical History:  Diagnosis Date   Anemia    Arthritis    Atrial fibrillation (HCC)    AV block, 1st degree    Bundle branch block    Chronic kidney disease    patient not aware of this dx   Coronary artery disease    Diabetes mellitus without complication (HCC)    type 2   Dyspnea    GERD (gastroesophageal reflux disease)    History of blood transfusion    Hypertension    Sleep apnea    does not use cpap    Past Surgical History:  Procedure Laterality Date   APPENDECTOMY     CARDIAC CATHETERIZATION  Left 08/16/2016   Procedure: Left Heart Cath and Coronary Angiography;  Surgeon: Alwyn Pea, MD;  Location: [facility] INVASIVE CV LAB;  Service: Cardiovascular;  Laterality: Left;   COLON SURGERY     part of colon removed   COLONOSCOPY N/A 09/07/2020   Procedure: COLONOSCOPY;  Surgeon: Pasty Spillers, MD;  Location: [facility] ENDOSCOPY;  Service: Endoscopy;  Laterality: N/A;   COLONOSCOPY WITH PROPOFOL N/A 05/17/2021   Procedure: COLONOSCOPY WITH PROPOFOL;  Surgeon: Pasty Spillers, MD;  Location: [facility] ENDOSCOPY;  Service: Endoscopy;  Laterality: N/A;   CORONARY ARTERY BYPASS GRAFT  08/30/2016   triple   CORONARY STENT INTERVENTION N/A 06/09/2020   Procedure: CORONARY STENT INTERVENTION;  Surgeon: Alwyn Pea, MD;  Location: [facility] INVASIVE CV LAB;  Service: Cardiovascular;  Laterality: N/A;   ENDOSCOPIC MUCOSAL RESECTION N/A 09/22/2020   Procedure: ENDOSCOPIC MUCOSAL RESECTION;  Surgeon: Meridee Score Netty Starring., MD;  Location: [facility] ENDOSCOPY;  Service: Gastroenterology;  Laterality: N/A;   ESOPHAGOGASTRODUODENOSCOPY N/A 09/07/2020   Procedure: ESOPHAGOGASTRODUODENOSCOPY (EGD);  Surgeon: Pasty Spillers, MD;  Location: [facility] ENDOSCOPY;  Service: Endoscopy;  Laterality: N/A;   ESOPHAGOGASTRODUODENOSCOPY (EGD) WITH PROPOFOL N/A 09/22/2020   Procedure: ESOPHAGOGASTRODUODENOSCOPY (EGD) WITH PROPOFOL;  Surgeon: Meridee Score Netty Starring., MD;  Location: [facility] ENDOSCOPY;  Service: Gastroenterology;  Laterality: N/A;   FRACTURE SURGERY     HEMOSTASIS CLIP PLACEMENT  09/22/2020  Procedure: HEMOSTASIS CLIP PLACEMENT;  Surgeon: Lemar Lofty., MD;  Location: [facility] ENDOSCOPY;  Service: Gastroenterology;;   HEMOSTASIS CONTROL  09/22/2020   Procedure: HEMOSTASIS CONTROL;  Surgeon: Lemar Lofty., MD;  Location: [facility] ENDOSCOPY;  Service: Gastroenterology;;   LEFT HEART CATH AND CORONARY ANGIOGRAPHY Left 06/09/2020   Procedure: LEFT HEART CATH AND CORONARY ANGIOGRAPHY;  Surgeon:  Alwyn Pea, MD;  Location: [facility] INVASIVE CV LAB;  Service: Cardiovascular;  Laterality: Left;   LEFT HEART CATH AND CORS/GRAFTS ANGIOGRAPHY Left 11/20/2016   Procedure: Left Heart Cath and Cors/Grafts Angiography;  Surgeon: Alwyn Pea, MD;  Location: [facility] INVASIVE CV LAB;  Service: Cardiovascular;  Laterality: Left;   POLYPECTOMY  09/22/2020   Procedure: POLYPECTOMY;  Surgeon: Mansouraty, Netty Starring., MD;  Location: [facility] ENDOSCOPY;  Service: Gastroenterology;;   SUBMUCOSAL LIFTING INJECTION  09/22/2020   Procedure: SUBMUCOSAL LIFTING INJECTION;  Surgeon: Lemar Lofty., MD;  Location: [facility] ENDOSCOPY;  Service: Gastroenterology;;    Medications Prior to Admission  Medication Sig Dispense Refill Last Dose   amLODipine (NORVASC) 10 MG tablet Take 10 mg by mouth daily.   Past Week   aspirin EC 81 MG tablet Take 81 mg by mouth daily. Swallow whole.   Past Week   clopidogrel (PLAVIX) 75 MG tablet Take 75 mg by mouth daily.   Past Week   dexlansoprazole (DEXILANT) 60 MG capsule Take 1 capsule by mouth daily.   Past Week   hydrALAZINE (APRESOLINE) 50 MG tablet Take 1 tablet by mouth 3 (three) times daily.   Past Week   hydrochlorothiazide (HYDRODIURIL) 12.5 MG tablet Take 12.5 mg by mouth daily.   Past Week   insulin glargine (LANTUS) 100 UNIT/ML injection Inject 25 Units into the skin at bedtime.    Past Week   irbesartan (AVAPRO) 300 MG tablet Take 1 tablet (300 mg total) by mouth daily. 30 tablet 1 Past Week   labetalol (NORMODYNE) 100 MG tablet Take 1 tablet by mouth 2 (two) times daily.   Past Week   metFORMIN (GLUCOPHAGE-XR) 500 MG 24 hr tablet Take 500 mg by mouth 2 (two) times daily.   Past Week   rosuvastatin (CRESTOR) 40 MG tablet Take 40 mg by mouth daily.   Past Week   sitaGLIPtin (JANUVIA) 100 MG tablet Take 100 mg by mouth daily.   Past Week   Testosterone 1.62 % GEL Apply 2 Pump topically daily. One pump on each arm 225 g 3 Past Week   acetaminophen (TYLENOL) 325 MG  tablet Take 2 tablets (650 mg total) by mouth every 6 (six) hours as needed for mild pain (or Fever >/= 101). (Patient not taking: Reported on 08/21/2022)      nebivolol (BYSTOLIC) 10 MG tablet Take 10 mg by mouth daily. (Patient not taking: Reported on 12/20/2022)   Not Taking   torsemide (DEMADEX) 20 MG tablet Take 20 mg by mouth 2 (two) times daily.       Social History   Socioeconomic History   Marital status: Married    Spouse name: Not on file   Number of children: Not on file   Years of education: Not on file   Highest education level: Not on file  Occupational History   Not on file  Tobacco Use   Smoking status: Former    Types: Cigarettes   Smokeless tobacco: Never  Vaping Use   Vaping Use: Never used  Substance and Sexual Activity   Alcohol use: No   Drug use: No  Sexual activity: Not Currently  Other Topics Concern   Not on file  Social History Narrative   Not on file   Social Determinants of Health   Financial Resource Strain: Not on file  Food Insecurity: Not on file  Transportation Needs: Not on file  Physical Activity: Not on file  Stress: Not on file  Social Connections: Not on file  Intimate Partner Violence: Not on file    History reviewed. No pertinent family history.    Intake/Output Summary (Last 24 hours) at 12/26/2022 1114 Last data filed at 12/26/2022 1051 Gross per 24 hour  Intake 851.83 ml  Output 2875 ml  Net -2023.17 ml    Vitals:   12/25/22 2322 12/26/22 0342 12/26/22 0822 12/26/22 1104  BP: 139/70 (!) 144/76 (!) 157/70   Pulse: (!) 56 63 71   Resp: 18 18 14    Temp: 98.3 F (36.8 C) 97.8 F (36.6 C) 97.8 F (36.6 C)   TempSrc: Oral  Oral   SpO2: 94% 96% 91%   Weight:    92.8 kg  Height:        PHYSICAL EXAM General: Pleasant ill-appearing, Caucasian male, upright in chair  HEENT:  Normocephalic and atraumatic. Chronic appearing crusted lesion present on left ear with minimal oozing this morning.  Neck:  No JVD.  Lungs:  Normal respiratory effort on oxygen by room air, no basilar crackles without appreciable wheezes.   Heart: Irregularly irregular with controlled rate. S1-S2 present 3/6 systolic murmur heard throughout Abdomen: Soft, nontender, mildly distended with reducible umbilical hernia, nontender to palpation Msk: Normal strength and tone for age. Extremities: Both lower extremities with compression wrappings, 1+ edema to upper calf, overall significant clinical improvement. Neuro: Alert and oriented X 3. Psych:  Answers questions appropriately.   Labs: Basic Metabolic Panel: Recent Labs    12/25/22 0512 12/26/22 0607  NA 135 134*  K 4.1 4.3  CL 90* 90*  CO2 34* 34*  GLUCOSE 131* 158*  BUN 36* 44*  CREATININE 1.44* 1.63*  CALCIUM 10.4* 10.8*  MG 2.0 2.1   Liver Function Tests: No results for input(s): "AST", "ALT", "ALKPHOS", "BILITOT", "PROT", "ALBUMIN" in the last 72 hours.  No results for input(s): "LIPASE", "AMYLASE" in the last 72 hours. CBC: Recent Labs    12/25/22 0512 12/26/22 0607  WBC 8.4 9.7  HGB 10.9* 11.5*  HCT 33.9* 36.5*  MCV 90.9 91.3  PLT 159 200   Cardiac Enzymes: No results for input(s): "CKTOTAL", "CKMB", "CKMBINDEX", "TROPONINIHS" in the last 72 hours.  BNP: No results for input(s): "BNP" in the last 72 hours.  D-Dimer: No results for input(s): "DDIMER" in the last 72 hours. Hemoglobin A1C: No results for input(s): "HGBA1C" in the last 72 hours.  Fasting Lipid Panel: No results for input(s): "CHOL", "HDL", "LDLCALC", "TRIG", "CHOLHDL", "LDLDIRECT" in the last 72 hours.  Thyroid Function Tests: No results for input(s): "TSH", "T4TOTAL", "T3FREE", "THYROIDAB" in the last 72 hours.  Invalid input(s): "FREET3" Anemia Panel: No results for input(s): "VITAMINB12", "FOLATE", "FERRITIN", "TIBC", "IRON", "RETICCTPCT" in the last 72 hours.   Radiology: ECHOCARDIOGRAM COMPLETE  Result Date: 12/23/2022    ECHOCARDIOGRAM REPORT   Patient Name:   [name]  [facility] Date of Exam: 12/23/2022 Medical Rec #:  [id number]        Height:       73.0 in Accession #:    [id number]       Weight:       220.6 lb Date of Birth:  08-22-1945         BSA:          2.243 m Patient Age:    77 years         BP:           147/68 mmHg Patient Gender: M                HR:           49 bpm. Exam Location:  [facility] Procedure: 2D Echo, Color Doppler and Cardiac Doppler Indications:     CHF--acute systolic I50.21  History:         Patient has no prior history of Echocardiogram examinations.                  Arrythmias:Atrial Fibrillation; Risk Factors:Diabetes,                  Hypertension and Sleep Apnea.  Sonographer:     Cristela Blue Referring Phys:  [id number] [facility] MICHELLE TANG Diagnosing Phys: Marcina Millard MD IMPRESSIONS  1. Left ventricular ejection fraction, by estimation, is 60 to 65%. The left ventricle has normal function. The left ventricle has no regional wall motion abnormalities. Left ventricular diastolic parameters were normal.  2. Right ventricular systolic function is normal. The right ventricular size is normal.  3. Left atrial size was mild to moderately dilated.  4. The mitral valve is normal in structure. Moderate mitral valve regurgitation. No evidence of mitral stenosis.  5. The aortic valve is normal in structure. Aortic valve regurgitation is not visualized. Mild to moderate aortic valve stenosis.  6. The inferior vena cava is normal in size with greater than 50% respiratory variability, suggesting right atrial pressure of 3 mmHg. FINDINGS  Left Ventricle: Left ventricular ejection fraction, by estimation, is 60 to 65%. The left ventricle has normal function. The left ventricle has no regional wall motion abnormalities. The left ventricular internal cavity size was normal in size. There is  no left ventricular hypertrophy. Left ventricular diastolic parameters were normal. Right Ventricle: The right ventricular size is normal. No increase in right ventricular wall  thickness. Right ventricular systolic function is normal. Left Atrium: Left atrial size was mild to moderately dilated. Right Atrium: Right atrial size was normal in size. Pericardium: There is no evidence of pericardial effusion. Mitral Valve: The mitral valve is normal in structure. Moderate mitral valve regurgitation. No evidence of mitral valve stenosis. MV peak gradient, 7.2 mmHg. The mean mitral valve gradient is 2.0 mmHg. Tricuspid Valve: The tricuspid valve is normal in structure. Tricuspid valve regurgitation is mild . No evidence of tricuspid stenosis. Aortic Valve: The aortic valve is normal in structure. Aortic valve regurgitation is not visualized. Mild to moderate aortic stenosis is present. Aortic valve mean gradient measures 15.0 mmHg. Aortic valve peak gradient measures 25.6 mmHg. Aortic valve area, by VTI measures 1.63 cm. Pulmonic Valve: The pulmonic valve was normal in structure. Pulmonic valve regurgitation is not visualized. No evidence of pulmonic stenosis. Aorta: The aortic root is normal in size and structure. Venous: The inferior vena cava is normal in size with greater than 50% respiratory variability, suggesting right atrial pressure of 3 mmHg. IAS/Shunts: No atrial level shunt detected by color flow Doppler.  LEFT VENTRICLE PLAX 2D LVIDd:         3.60 cm   Diastology LVIDs:         2.30 cm   LV e' medial:    6.42 cm/s LV  PW:         1.90 cm   LV E/e' medial:  20.9 LV IVS:        1.80 cm   LV e' lateral:   9.25 cm/s LVOT diam:     2.00 cm   LV E/e' lateral: 14.5 LV SV:         86 LV SV Index:   38 LVOT Area:     3.14 cm  RIGHT VENTRICLE RV Basal diam:  5.00 cm RV Mid diam:    3.50 cm RV S prime:     13.70 cm/s TAPSE (M-mode): 2.5 cm LEFT ATRIUM             Index        RIGHT ATRIUM           Index LA diam:        4.90 cm 2.18 cm/m   RA Area:     21.20 cm LA Vol (A2C):   27.6 ml 12.30 ml/m  RA Volume:   64.70 ml  28.84 ml/m LA Vol (A4C):   57.9 ml 25.81 ml/m LA Biplane Vol: 43.3 ml  19.30 ml/m  AORTIC VALVE AV Area (Vmax):    1.53 cm AV Area (Vmean):   1.55 cm AV Area (VTI):     1.63 cm AV Vmax:           253.00 cm/s AV Vmean:          180.667 cm/s AV VTI:            0.525 m AV Peak Grad:      25.6 mmHg AV Mean Grad:      15.0 mmHg LVOT Vmax:         123.00 cm/s LVOT Vmean:        89.100 cm/s LVOT VTI:          0.273 m LVOT/AV VTI ratio: 0.52  AORTA Ao Root diam: 3.20 cm MITRAL VALVE                TRICUSPID VALVE MV Area (PHT): 4.06 cm     TR Peak grad:   28.3 mmHg MV Area VTI:   2.69 cm     TR Vmax:        266.00 cm/s MV Peak grad:  7.2 mmHg MV Mean grad:  2.0 mmHg     SHUNTS MV Vmax:       1.34 m/s     Systemic VTI:  0.27 m MV Vmean:      70.8 cm/s    Systemic Diam: 2.00 cm MV Decel Time: 187 msec MV E velocity: 134.00 cm/s MV A velocity: 70.70 cm/s MV E/A ratio:  1.90 Marcina Millard MD Electronically signed by Marcina Millard MD Signature Date/Time: 12/23/2022/3:18:55 PM    Final    US Venous Img Lower Bilateral  Result Date: 12/20/2022 CLINICAL DATA:  Bilateral lower extremity swelling and pain EXAM: BILATERAL LOWER EXTREMITY VENOUS DOPPLER ULTRASOUND TECHNIQUE: Gray-scale sonography with graded compression, as well as color Doppler and duplex ultrasound were performed to evaluate the lower extremity deep venous systems from the level of the common femoral vein and including the common femoral, femoral, profunda femoral, popliteal and calf veins including the posterior tibial, peroneal and gastrocnemius veins when visible. Spectral Doppler was utilized to evaluate flow at rest and with distal augmentation maneuvers in the common femoral, femoral and popliteal veins. COMPARISON:  None Available. FINDINGS: RIGHT LOWER EXTREMITY Common Femoral Vein: No evidence  of thrombus. Normal compressibility, respiratory phasicity and response to augmentation. Saphenofemoral Junction: No evidence of thrombus. Normal compressibility and flow on color Doppler imaging. Profunda Femoral  Vein: No evidence of thrombus. Normal compressibility and flow on color Doppler imaging. Femoral Vein: No evidence of thrombus. Normal compressibility, respiratory phasicity and response to augmentation. Popliteal Vein: No evidence of thrombus. Normal compressibility, respiratory phasicity and response to augmentation. Calf Veins: No evidence of thrombus. Normal compressibility and flow on color Doppler imaging. LEFT LOWER EXTREMITY Common Femoral Vein: No evidence of thrombus. Normal compressibility, respiratory phasicity and response to augmentation. Saphenofemoral Junction: No evidence of thrombus. Normal compressibility and flow on color Doppler imaging. Profunda Femoral Vein: No evidence of thrombus. Normal compressibility and flow on color Doppler imaging. Femoral Vein: No evidence of thrombus. Normal compressibility, respiratory phasicity and response to augmentation. Popliteal Vein: No evidence of thrombus. Normal compressibility, respiratory phasicity and response to augmentation. Calf Veins: No evidence of thrombus. Normal compressibility and flow on color Doppler imaging. Other Findings: Benign-appearing fatty replaced elongated lymph nodes noted bilaterally. Extremity subcutaneous edema present bilaterally. IMPRESSION: No evidence of deep venous thrombosis in either lower extremity. Electronically Signed   By: Judie Petit.  Shick M.D.   On: 12/20/2022 14:38   DG Chest Portable 1 View  Result Date: 12/20/2022 CLINICAL DATA:  Shortness of breath EXAM: PORTABLE CHEST 1 VIEW COMPARISON:  X-ray 08/21/2020 FINDINGS: Underinflation with bilateral small pleural effusions, new from previous. Adjacent opacity. Vascular congestion. No pneumothorax. Status post median sternotomy. The heart is enlarged. Overlapping cardiac leads. IMPRESSION: Postop chest. Enlarged cardiopericardial silhouette with vascular congestion and small effusions. Adjacent lung opacities. Recommend follow-up Electronically Signed   By: Karen Kays  M.D.   On: 12/20/2022 13:37    LHC 2021  LM lesion is 40% stenosed. Prox RCA lesion is 99% stenosed. Mid RCA lesion is 75% stenosed. Dist RCA lesion is 95% stenosed. RPDA lesion is 90% stenosed. SVG and is normal in caliber. The flow is not reversed. There is no competitive flow SVG and is normal in caliber. The flow is not reversed. There is no competitive flow Prox Graft lesion is 100% stenosed. LIMA and is normal in caliber. The flow is not reversed. There is no competitive flow Origin lesion is 95% stenosed. Previously placed Prox Cx to Dist Cx stent (unknown type) is widely patent. Dist Cx lesion is 90% stenosed. Previously placed Ost LAD to Mid LAD stent (unknown type) is widely patent. Origin to Prox Graft lesion is 100% stenosed. A drug-eluting stent was successfully placed using a STENT RESOLUTE ONYX 2.5X18. Post intervention, there is a 0% residual stenosis. Ost Cx lesion is 70% stenosed. Dist LAD-1 lesion is 50% stenosed. Previously placed Dist LAD-2 stent (unknown type) is widely patent. Ost LAD lesion is 75% stenosed. Preserved left ventricular function ejection fraction of 60%   Conclusion Normal left ventricular function 60% Coronaries Left main large calcified moderate lesion moderate to large calcium Distal LAD large ostial lesion 75% moderate calcification proximal LAD stent widely patent mid LAD stent widely patent distal LAD diffuse 50% Circumflex is large 70% ostial mid stent widely patent distal circumflex 95% TIMI-3 flow RCA medium proximal 90% lesion 90% mid distal 75 Grafts LIMA to LAD incompletely evaluated probably occluded SVG to circumflex 100% occluded SVG to PDA 95% ostial in-stent TIMI-3 flow Intervention Successful PCI and stent to in-stent restenosis ostially with 2.5 x 18 mm resolute Onyx to 20 atm Lesion reduced from 95 down to 0% TIMI-3 flow maintained throughout  Successful PCI and stent ostial SVG in-stent restenosis. Lesion  reduced from 95 down to 0%  ECHO  NORMAL LEFT VENTRICULAR SYSTOLIC FUNCTION   WITH SEVERE LVH  NORMAL RIGHT VENTRICULAR SYSTOLIC FUNCTION  MODERATE VALVULAR REGURGITATION (See above)  MILD VALVULAR STENOSIS (See above)  IRREGULAR HEART RHYTHM CAPTURED THROUGHOUT EXAM  ESTIMATED LVEF >55%  MODERATE AI  MILD AS; Max Velocity: 3.83m/s; AVA: 1.1cm^2  (PREV. ECHO: 2.39m/s; AVA: 1.9cm^2)  MODERATE - SEVERE MR (MVP: MILD - AMVL)  MODERATE TR; SEVERE PHTN  SEVERE LAE; MILD RAE  MILDLY DILATED ASCENDING AORTA MEASURING UP TO 3.7cm  PATIENT IMAGED SUPINE FROM APICALS TO PEDOFF TO SUBCOSTAL; PATIENT HAD DIFFICULTY LAYING  DOWN; FURTHER EVALUATION DID NOT SHOW ANY SIGNS OF TAMPONADE  _________________________________________________________________________________________  Electronically signed by    Dorothyann Peng, MD on 09/28/2022 07: 54 AM           Performed By: Verdis Prime     Ordering Physician: Dorothyann Peng, MD   TELEMETRY reviewed by me [facility]) 12/26/2022 : AF rate 50s to 60s without recurrent pauses   EKG reviewed by me: AF RBBB and LAFB rate 70  Data reviewed by me [facility]) 12/26/2022: Hospitalist progress note, nursing notes last 24h vitals tele labs imaging I/O    Principal Problem:   Acute heart failure with preserved ejection fraction (HFpEF) (HCC) Active Problems:   Insulin dependent type 2 diabetes mellitus (HCC)   Essential hypertension   OSA (obstructive sleep apnea)   CAD (coronary artery disease), native coronary artery   Hypercalcemia   Chronic venous stasis   Paroxysmal atrial fibrillation (HCC)   Hyponatremia   AKI (acute kidney injury) (HCC)   Severe pulmonary hypertension (HCC)   Vitamin D deficiency   Acute respiratory failure with hypoxia (HCC)    ASSESSMENT AND PLAN:  [name] is a 19yoM with a PMH of CAD s/p CABG (LIMA-LAD, SVG - LCX 100% occluded, SVG - PDA s/p PCI (2021) with prior stents, paroxysmal AF (not anticoagulated), HFpEF (severe LVH,  mild AS, moderate AI, moderate to severe MR, moderate TR by echo 09/2022) DM2, CKD, OSA, who presented to [facility] for an outpatient left heart cath with Dr. Juliann Pares the morning of 5/9.  He was orthopneic with significant peripheral edema and was ultimately unable to lay flat for the procedure. He was referred to [name] ED for further evaluation and management of acute on chronic heart failure.  # Acute on chronic HFpEF # Moderate to severe MR # Mild-moderate AS, no AI Presents with 2 months of progressively worsening shortness of breath and exertional chest pain for which LHC was initially recommended by Dr. Juliann Pares.  Over the past 2 weeks his peripheral edema, orthopnea, and dyspnea have significantly worsened, refractory to outpatient torsemide. On admission he was massively volume overloaded on exam with tense woody edema with weeping from wounds on his legs, excellent diuresis on a Lasix drip with Net IO Since Admission: -17,566.81 mL [12/26/22 1114].  Echo resulted with preserved LVEF, moderate MR, mild to moderate AS without aortic insufficiency visualized -Lasix infusion discontinued this morning, will start po Torsemide 40 mg twice daily.  - Monitor and replenish electrolytes for a goal K >4, mag >2 -Nitropaste PRN. Cough with PO nitrates. -Continue metoprolol tartrate 25 mg twice daily with hold parameters for HR <60 -Continue hydralazine 100 mg 3 times daily -Continue spironolactone 25 mg daily  -Further additions of ARB, SGLT2i pending stability of his renal function. -Discussed low-salt diet / alternatives which  the patient is motivated to adhere to  # CAD s/p CABG and multiple prior PCI's Chest pain-free at rest, EKGs nonacute, troponins negative.     No immediate plan for invasive cardiac diagnostics.  Continue management of his heart failure as above -Will defer LHC during this hospitalization, plan to reassess at outpatient hospital follow up appointment.  -Continue Plavix 75 mg  daily -Aspirin discontinued as Eliquis was started.  # Paroxysmal atrial fibrillation # Pauses Rate 50s-60s this morning without any recurrent pauses. Not historically anticoagulated prior to this admission for unknown reasons.  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   -Continue telemetry monitoring while inpatient -Will continue metoprolol for rate control -Continue rate control, echo with some LA remodeling suggesting that rhythm control may be difficult to achieve -After discussion of risks/benefits on 5/10, patient is agreeable to starting a DOAC at discharge with close monitoring for bleeding side effects and H&H. CHA2DS2-VASc 5 (age, CHF, HTN, CAD)  -Continue Eliquis 5mg  twice daily for stroke prevention.    Anticipate patient will be ready for discharge tomorrow morning, will evaluate renal function on AM labs.   This patient's plan of care was discussed and created with Dr. Darrold Junker and he is in agreement.  Signed: Gale Journey , PA-C 12/26/2022, 11:14 AM [facility]

## 2022-12-26 NOTE — Progress Notes (Signed)
Progress Note   Patient: [name]:[id number] DOB: September 19, 1945 DOA: 12/20/2022     6 DOS: the patient was seen and examined on 12/26/2022   Brief hospital course: JOVEL BUCKALEW is a 77 y.o. male with medical history significant of CAD s/p CABG and DES, severe pulmonary hypertension, hypertension, hyperlipidemia, OSA, type 2 diabetes, hypothyroidism, who presented to the ED due to shortness of breath.   Recent echocardiogram showed preserved LV systolic function, severe LVH, moderate to severe mitral regurgitation and tricuspid regurgitation and severe pulm hypertension.  Patient was diagnosed with acute on chronic diastolic congestive heart failure, was given IV Lasix drip.  Treatment was switched to torsemide in spironolactone 5/15.    Principal Problem:   Acute heart failure with preserved ejection fraction (HFpEF) (HCC) Active Problems:   CAD (coronary artery disease), native coronary artery   Paroxysmal atrial fibrillation (HCC)   Insulin dependent type 2 diabetes mellitus (HCC)   Essential hypertension   OSA (obstructive sleep apnea)   Hypercalcemia   Chronic venous stasis   Hyponatremia   AKI (acute kidney injury) (HCC)   Severe pulmonary hypertension (HCC)   Vitamin D deficiency   Acute respiratory failure with hypoxia (HCC)   Severe mitral insufficiency   Severe tricuspid regurgitation   Assessment and Plan: Acute on chronic diastolic congestive heart failure. Moderate to severe mitral regurgitation. Moderate to severe tricuspid regurgitation. Severe pulmonary hypertension. Patient was initially placed on IV Lasix drip, condition had improved, switch to oral antibiotics today. Will continue to follow, may be able to discharge tomorrow if condition continues to improve.  Acute hypoxemic respite failure secondary to exacerbation of congestive heart failure. Oxygenation is better today.  Acute kidney injury. Hyponatremia. Hypercalcemia. Renal function is  worse today, diuretics changed to oral. Continue to follow renal function. Patient also has chronic hypercalcemia, he also has vitamin D deficiency.  Prior workup with PTH and PTH related peptide 2 years ago were negative.  Started vitamin D supplement.  Paroxysmal atrial fibrillation. Eliquis was started.  Coronary artery disease. Condition stable, continue Plavix and statin.  Uncontrolled type 2 diabetes with hyperglycemia. Continue sinus insulin and long-acting insulin.    Subjective:  Patient feel much better today, short of breath is better.  Able to walk with assist.  Physical Exam: Vitals:   12/26/22 0342 12/26/22 0822 12/26/22 1104 12/26/22 1207  BP: (!) 144/76 (!) 157/70  130/68  Pulse: 63 71  69  Resp: 18 14  17   Temp: 97.8 F (36.6 C) 97.8 F (36.6 C)  (!) 97.4 F (36.3 C)  TempSrc:  Oral  Oral  SpO2: 96% 91%  94%  Weight:   92.8 kg   Height:       General exam: Appears calm and comfortable  Respiratory system: Clear to auscultation. Respiratory effort normal. Cardiovascular system: S1 & S2 heard, RRR. No JVD, murmurs, rubs, gallops or clicks. 1+ pedal edema. Gastrointestinal system: Abdomen is nondistended, soft and nontender. No organomegaly or masses felt. Normal bowel sounds heard. Central nervous system: Alert and oriented. No focal neurological deficits. Extremities: Symmetric 5 x 5 power. Skin: No rashes, lesions or ulcers Psychiatry: Judgement and insight appear normal. Mood & affect appropriate.    Data Reviewed:  Chest x-ray and lab results reviewed.  Family Communication: None  Disposition: Status is: Inpatient Remains inpatient appropriate because: Severity of disease, IV treatment.     Time spent: 55 minutes  Author: Marrion Coy, MD 12/26/2022 2:19 PM  For on call  review [email or web address].

## 2022-12-27 DIAGNOSIS — J9601 Acute respiratory failure with hypoxia: Secondary | ICD-10-CM | POA: Diagnosis not present

## 2022-12-27 DIAGNOSIS — I480 Paroxysmal atrial fibrillation: Secondary | ICD-10-CM | POA: Diagnosis not present

## 2022-12-27 DIAGNOSIS — I5031 Acute diastolic (congestive) heart failure: Secondary | ICD-10-CM | POA: Diagnosis not present

## 2022-12-27 LAB — CBC
HCT: 35.1 % — ABNORMAL LOW (ref 39.0–52.0)
Hemoglobin: 11.4 g/dL — ABNORMAL LOW (ref 13.0–17.0)
MCH: 29.6 pg (ref 26.0–34.0)
MCHC: 32.5 g/dL (ref 30.0–36.0)
MCV: 91.2 fL (ref 80.0–100.0)
Platelets: 224 10*3/uL (ref 150–400)
RBC: 3.85 MIL/uL — ABNORMAL LOW (ref 4.22–5.81)
RDW: 16.6 % — ABNORMAL HIGH (ref 11.5–15.5)
WBC: 8.8 10*3/uL (ref 4.0–10.5)
nRBC: 0 % (ref 0.0–0.2)

## 2022-12-27 LAB — BASIC METABOLIC PANEL
Anion gap: 11 (ref 5–15)
BUN: 50 mg/dL — ABNORMAL HIGH (ref 8–23)
CO2: 33 mmol/L — ABNORMAL HIGH (ref 22–32)
Calcium: 10.6 mg/dL — ABNORMAL HIGH (ref 8.9–10.3)
Chloride: 89 mmol/L — ABNORMAL LOW (ref 98–111)
Creatinine, Ser: 1.73 mg/dL — ABNORMAL HIGH (ref 0.61–1.24)
GFR, Estimated: 41 mL/min — ABNORMAL LOW (ref >60)
Glucose, Bld: 152 mg/dL — ABNORMAL HIGH (ref 70–99)
Potassium: 4 mmol/L (ref 3.5–5.1)
Sodium: 133 mmol/L — ABNORMAL LOW (ref 135–145)

## 2022-12-27 LAB — GLUCOSE, CAPILLARY: Glucose-Capillary: 150 mg/dL — ABNORMAL HIGH (ref 70–99)

## 2022-12-27 LAB — MAGNESIUM: Magnesium: 2.3 mg/dL (ref 1.7–2.4)

## 2022-12-27 MED ORDER — TORSEMIDE 20 MG PO TABS: 20.0000 mg | ORAL_TABLET | Freq: Two times a day (BID) | ORAL | Status: DC

## 2022-12-27 MED ORDER — VITAMIN D3 25 MCG PO TABS: 1000.0000 [IU] | ORAL_TABLET | Freq: Every day | ORAL | 0 refills | Status: DC

## 2022-12-27 MED ORDER — SPIRONOLACTONE 25 MG PO TABS: 25.0000 mg | ORAL_TABLET | Freq: Every day | ORAL | 0 refills | Status: DC

## 2022-12-27 MED ORDER — TORSEMIDE 20 MG PO TABS: 40.0000 mg | ORAL_TABLET | Freq: Two times a day (BID) | ORAL | Status: DC

## 2022-12-27 MED ORDER — APIXABAN 5 MG PO TABS: 5.0000 mg | ORAL_TABLET | Freq: Two times a day (BID) | ORAL | 0 refills | Status: DC

## 2022-12-27 NOTE — Progress Notes (Signed)
[facility] CLINIC CARDIOLOGY CONSULT NOTE       Patient ID: [name] MRN: [id number] DOB/AGE: [birth year] 77 y.o.  Admit date: 12/20/2022 Referring Physician Dr. Artis Delay Primary Physician Dr. Arlana Pouch Primary Cardiologist Dr. Juliann Pares Reason for Consultation AoCHF  HPI: [name] is a 77yoM with a PMH of CAD s/p CABG (LIMA-LAD, SVG - LCX 100% occluded, SVG - PDA s/p PCI (2021) with prior stents, paroxysmal AF (not anticoagulated), HFpEF (severe LVH, mild AS, moderate AI, moderate to severe MR, moderate TR by echo 09/2022) DM2, CKD, OSA, who presented to [facility] for an outpatient left heart cath with Dr. Juliann Pares the morning of 5/9.  He was orthopneic with significant peripheral edema and was ultimately unable to lay flat for the procedure. He was referred to [facility] ED for further evaluation and management of acute on chronic heart failure. Echo resulted with fortunately preserved LVEF, mild to moderate left atrial dilation, moderate MR, and mild to moderate aortic stenosis, mild TR  Interval history: - continues to deny chest pain, no heart racing of palpitations - Has been OOB walking around nurses station without difficulty or dyspnea - still making urine (2L output yesterday total)  Net IO Since Admission: -18,366.81 mL [12/27/22 0815]  - Weight down from 246 lbs on admission to 202 lbs today  Review of systems complete and found to be negative unless listed above    Past Medical History:  Diagnosis Date   Anemia    Arthritis    Atrial fibrillation (HCC)    AV block, 1st degree    Bundle branch block    Chronic kidney disease    patient not aware of this dx   Coronary artery disease    Diabetes mellitus without complication (HCC)    type 2   Dyspnea    GERD (gastroesophageal reflux disease)    History of blood transfusion    Hypertension    Sleep apnea    does not use cpap    Past Surgical History:  Procedure Laterality Date   APPENDECTOMY     CARDIAC  CATHETERIZATION Left 08/16/2016   Procedure: Left Heart Cath and Coronary Angiography;  Surgeon: Alwyn Pea, MD;  Location: [facility] INVASIVE CV LAB;  Service: Cardiovascular;  Laterality: Left;   COLON SURGERY     part of colon removed   COLONOSCOPY N/A 09/07/2020   Procedure: COLONOSCOPY;  Surgeon: Pasty Spillers, MD;  Location: [facility] ENDOSCOPY;  Service: Endoscopy;  Laterality: N/A;   COLONOSCOPY WITH PROPOFOL N/A 05/17/2021   Procedure: COLONOSCOPY WITH PROPOFOL;  Surgeon: Pasty Spillers, MD;  Location: [facility] ENDOSCOPY;  Service: Endoscopy;  Laterality: N/A;   CORONARY ARTERY BYPASS GRAFT  08/30/2016   triple   CORONARY STENT INTERVENTION N/A 06/09/2020   Procedure: CORONARY STENT INTERVENTION;  Surgeon: Alwyn Pea, MD;  Location: [facility] INVASIVE CV LAB;  Service: Cardiovascular;  Laterality: N/A;   ENDOSCOPIC MUCOSAL RESECTION N/A 09/22/2020   Procedure: ENDOSCOPIC MUCOSAL RESECTION;  Surgeon: Meridee Score Netty Starring., MD;  Location: [facility] ENDOSCOPY;  Service: Gastroenterology;  Laterality: N/A;   ESOPHAGOGASTRODUODENOSCOPY N/A 09/07/2020   Procedure: ESOPHAGOGASTRODUODENOSCOPY (EGD);  Surgeon: Pasty Spillers, MD;  Location: [facility] ENDOSCOPY;  Service: Endoscopy;  Laterality: N/A;   ESOPHAGOGASTRODUODENOSCOPY (EGD) WITH PROPOFOL N/A 09/22/2020   Procedure: ESOPHAGOGASTRODUODENOSCOPY (EGD) WITH PROPOFOL;  Surgeon: Meridee Score Netty Starring., MD;  Location: [facility] ENDOSCOPY;  Service: Gastroenterology;  Laterality: N/A;   FRACTURE SURGERY     HEMOSTASIS CLIP PLACEMENT  09/22/2020   Procedure: HEMOSTASIS  CLIP PLACEMENT;  Surgeon: Mansouraty, Netty Starring., MD;  Location: [facility] ENDOSCOPY;  Service: Gastroenterology;;   HEMOSTASIS CONTROL  09/22/2020   Procedure: HEMOSTASIS CONTROL;  Surgeon: Lemar Lofty., MD;  Location: [facility] ENDOSCOPY;  Service: Gastroenterology;;   LEFT HEART CATH AND CORONARY ANGIOGRAPHY Left 06/09/2020   Procedure: LEFT HEART CATH AND CORONARY ANGIOGRAPHY;   Surgeon: Alwyn Pea, MD;  Location: [facility] INVASIVE CV LAB;  Service: Cardiovascular;  Laterality: Left;   LEFT HEART CATH AND CORS/GRAFTS ANGIOGRAPHY Left 11/20/2016   Procedure: Left Heart Cath and Cors/Grafts Angiography;  Surgeon: Alwyn Pea, MD;  Location: [facility] INVASIVE CV LAB;  Service: Cardiovascular;  Laterality: Left;   POLYPECTOMY  09/22/2020   Procedure: POLYPECTOMY;  Surgeon: Mansouraty, Netty Starring., MD;  Location: [facility] ENDOSCOPY;  Service: Gastroenterology;;   SUBMUCOSAL LIFTING INJECTION  09/22/2020   Procedure: SUBMUCOSAL LIFTING INJECTION;  Surgeon: Lemar Lofty., MD;  Location: [facility] ENDOSCOPY;  Service: Gastroenterology;;    Medications Prior to Admission  Medication Sig Dispense Refill Last Dose   amLODipine (NORVASC) 10 MG tablet Take 10 mg by mouth daily.   Past Week   aspirin EC 81 MG tablet Take 81 mg by mouth daily. Swallow whole.   Past Week   clopidogrel (PLAVIX) 75 MG tablet Take 75 mg by mouth daily.   Past Week   dexlansoprazole (DEXILANT) 60 MG capsule Take 1 capsule by mouth daily.   Past Week   hydrALAZINE (APRESOLINE) 50 MG tablet Take 1 tablet by mouth 3 (three) times daily.   Past Week   hydrochlorothiazide (HYDRODIURIL) 12.5 MG tablet Take 12.5 mg by mouth daily.   Past Week   insulin glargine (LANTUS) 100 UNIT/ML injection Inject 25 Units into the skin at bedtime.    Past Week   irbesartan (AVAPRO) 300 MG tablet Take 1 tablet (300 mg total) by mouth daily. 30 tablet 1 Past Week   labetalol (NORMODYNE) 100 MG tablet Take 1 tablet by mouth 2 (two) times daily.   Past Week   metFORMIN (GLUCOPHAGE-XR) 500 MG 24 hr tablet Take 500 mg by mouth 2 (two) times daily.   Past Week   rosuvastatin (CRESTOR) 40 MG tablet Take 40 mg by mouth daily.   Past Week   sitaGLIPtin (JANUVIA) 100 MG tablet Take 100 mg by mouth daily.   Past Week   Testosterone 1.62 % GEL Apply 2 Pump topically daily. One pump on each arm 225 g 3 Past Week   acetaminophen  (TYLENOL) 325 MG tablet Take 2 tablets (650 mg total) by mouth every 6 (six) hours as needed for mild pain (or Fever >/= 101). (Patient not taking: Reported on 08/21/2022)      nebivolol (BYSTOLIC) 10 MG tablet Take 10 mg by mouth daily. (Patient not taking: Reported on 12/20/2022)   Not Taking   torsemide (DEMADEX) 20 MG tablet Take 20 mg by mouth 2 (two) times daily.       Social History   Socioeconomic History   Marital status: Married    Spouse name: Not on file   Number of children: Not on file   Years of education: Not on file   Highest education level: Not on file  Occupational History   Not on file  Tobacco Use   Smoking status: Former    Types: Cigarettes   Smokeless tobacco: Never  Vaping Use   Vaping Use: Never used  Substance and Sexual Activity   Alcohol use: No   Drug use: No  Sexual activity: Not Currently  Other Topics Concern   Not on file  Social History Narrative   Not on file   Social Determinants of Health   Financial Resource Strain: Not on file  Food Insecurity: No Food Insecurity (12/26/2022)   Hunger Vital Sign    Worried About Running Out of Food in the Last Year: Never true    Ran Out of Food in the Last Year: Never true  Transportation Needs: No Transportation Needs (12/26/2022)   PRAPARE - Administrator, Civil Service (Medical): No    Lack of Transportation (Non-Medical): No  Physical Activity: Not on file  Stress: Not on file  Social Connections: Not on file  Intimate Partner Violence: Not At Risk (12/26/2022)   Humiliation, Afraid, Rape, and Kick questionnaire    Fear of Current or Ex-Partner: No    Emotionally Abused: No    Physically Abused: No    Sexually Abused: No    History reviewed. No pertinent family history.    Intake/Output Summary (Last 24 hours) at 12/27/2022 0815 Last data filed at 12/27/2022 0559 Gross per 24 hour  Intake 1571.83 ml  Output 2000 ml  Net -428.17 ml     Vitals:   12/26/22 1207 12/26/22  2012 12/26/22 2340 12/27/22 0538  BP: 130/68 (!) 147/59 133/79 134/75  Pulse: 69 61 64 61  Resp: 17 18 18 18   Temp: (!) 97.4 F (36.3 C) 98 F (36.7 C) 97.8 F (36.6 C) 97.7 F (36.5 C)  TempSrc: Oral     SpO2: 94% 95% 92% 94%  Weight:      Height:        PHYSICAL EXAM General: Pleasant ill-appearing, Caucasian male, upright in chair  HEENT:  Normocephalic and atraumatic. Chronic appearing dime sized pearly lesion present on left ear  Neck:  No JVD.  Lungs: Normal respiratory effort on oxygen by room air, no crackles or wheezes.   Heart: Irregularly irregular with controlled rate. S1-S2 present 3/6 systolic murmur heard throughout Abdomen: Soft, nontender, mildly distended with reducible umbilical hernia, nontender to palpation Msk: Normal strength and tone for age. Extremities: Both lower extremities with compression wrappings, 1+ edema to upper calf, overall significant clinical improvement. Neuro: Alert and oriented X 3. Psych:  Answers questions appropriately.   Labs: Basic Metabolic Panel: Recent Labs    12/26/22 0607 12/27/22 0514  NA 134* 133*  K 4.3 4.0  CL 90* 89*  CO2 34* 33*  GLUCOSE 158* 152*  BUN 44* 50*  CREATININE 1.63* 1.73*  CALCIUM 10.8* 10.6*  MG 2.1 2.3    Liver Function Tests: No results for input(s): "AST", "ALT", "ALKPHOS", "BILITOT", "PROT", "ALBUMIN" in the last 72 hours.  No results for input(s): "LIPASE", "AMYLASE" in the last 72 hours. CBC: Recent Labs    12/26/22 0607 12/27/22 0514  WBC 9.7 8.8  HGB 11.5* 11.4*  HCT 36.5* 35.1*  MCV 91.3 91.2  PLT 200 224    Cardiac Enzymes: No results for input(s): "CKTOTAL", "CKMB", "CKMBINDEX", "TROPONINIHS" in the last 72 hours.  BNP: No results for input(s): "BNP" in the last 72 hours.  D-Dimer: No results for input(s): "DDIMER" in the last 72 hours. Hemoglobin A1C: No results for input(s): "HGBA1C" in the last 72 hours.  Fasting Lipid Panel: No results for input(s): "CHOL",  "HDL", "LDLCALC", "TRIG", "CHOLHDL", "LDLDIRECT" in the last 72 hours.  Thyroid Function Tests: No results for input(s): "TSH", "T4TOTAL", "T3FREE", "THYROIDAB" in the last 72 hours.  Invalid  input(s): "FREET3" Anemia Panel: No results for input(s): "VITAMINB12", "FOLATE", "FERRITIN", "TIBC", "IRON", "RETICCTPCT" in the last 72 hours.   Radiology: ECHOCARDIOGRAM COMPLETE  Result Date: 12/23/2022    ECHOCARDIOGRAM REPORT   Patient Name:   [name] [facility] Date of Exam: 12/23/2022 Medical Rec #:  [id number]        Height:       73.0 in Accession #:    [id number]       Weight:       220.6 lb Date of Birth:  1946-04-02         BSA:          2.243 m Patient Age:    77 years         BP:           147/68 mmHg Patient Gender: M                HR:           49 bpm. Exam Location:  [facility] Procedure: 2D Echo, Color Doppler and Cardiac Doppler Indications:     CHF--acute systolic I50.21  History:         Patient has no prior history of Echocardiogram examinations.                  Arrythmias:Atrial Fibrillation; Risk Factors:Diabetes,                  Hypertension and Sleep Apnea.  Sonographer:     Cristela Blue Referring Phys:  [id number] [facility] MICHELLE Kayhan Boardley Diagnosing Phys: Marcina Millard MD IMPRESSIONS  1. Left ventricular ejection fraction, by estimation, is 60 to 65%. The left ventricle has normal function. The left ventricle has no regional wall motion abnormalities. Left ventricular diastolic parameters were normal.  2. Right ventricular systolic function is normal. The right ventricular size is normal.  3. Left atrial size was mild to moderately dilated.  4. The mitral valve is normal in structure. Moderate mitral valve regurgitation. No evidence of mitral stenosis.  5. The aortic valve is normal in structure. Aortic valve regurgitation is not visualized. Mild to moderate aortic valve stenosis.  6. The inferior vena cava is normal in size with greater than 50% respiratory variability, suggesting right atrial  pressure of 3 mmHg. FINDINGS  Left Ventricle: Left ventricular ejection fraction, by estimation, is 60 to 65%. The left ventricle has normal function. The left ventricle has no regional wall motion abnormalities. The left ventricular internal cavity size was normal in size. There is  no left ventricular hypertrophy. Left ventricular diastolic parameters were normal. Right Ventricle: The right ventricular size is normal. No increase in right ventricular wall thickness. Right ventricular systolic function is normal. Left Atrium: Left atrial size was mild to moderately dilated. Right Atrium: Right atrial size was normal in size. Pericardium: There is no evidence of pericardial effusion. Mitral Valve: The mitral valve is normal in structure. Moderate mitral valve regurgitation. No evidence of mitral valve stenosis. MV peak gradient, 7.2 mmHg. The mean mitral valve gradient is 2.0 mmHg. Tricuspid Valve: The tricuspid valve is normal in structure. Tricuspid valve regurgitation is mild . No evidence of tricuspid stenosis. Aortic Valve: The aortic valve is normal in structure. Aortic valve regurgitation is not visualized. Mild to moderate aortic stenosis is present. Aortic valve mean gradient measures 15.0 mmHg. Aortic valve peak gradient measures 25.6 mmHg. Aortic valve area, by VTI measures 1.63 cm. Pulmonic Valve: The pulmonic valve was normal in structure. Pulmonic valve  regurgitation is not visualized. No evidence of pulmonic stenosis. Aorta: The aortic root is normal in size and structure. Venous: The inferior vena cava is normal in size with greater than 50% respiratory variability, suggesting right atrial pressure of 3 mmHg. IAS/Shunts: No atrial level shunt detected by color flow Doppler.  LEFT VENTRICLE PLAX 2D LVIDd:         3.60 cm   Diastology LVIDs:         2.30 cm   LV e' medial:    6.42 cm/s LV PW:         1.90 cm   LV E/e' medial:  20.9 LV IVS:        1.80 cm   LV e' lateral:   9.25 cm/s LVOT diam:      2.00 cm   LV E/e' lateral: 14.5 LV SV:         86 LV SV Index:   38 LVOT Area:     3.14 cm  RIGHT VENTRICLE RV Basal diam:  5.00 cm RV Mid diam:    3.50 cm RV S prime:     13.70 cm/s TAPSE (M-mode): 2.5 cm LEFT ATRIUM             Index        RIGHT ATRIUM           Index LA diam:        4.90 cm 2.18 cm/m   RA Area:     21.20 cm LA Vol (A2C):   27.6 ml 12.30 ml/m  RA Volume:   64.70 ml  28.84 ml/m LA Vol (A4C):   57.9 ml 25.81 ml/m LA Biplane Vol: 43.3 ml 19.30 ml/m  AORTIC VALVE AV Area (Vmax):    1.53 cm AV Area (Vmean):   1.55 cm AV Area (VTI):     1.63 cm AV Vmax:           253.00 cm/s AV Vmean:          180.667 cm/s AV VTI:            0.525 m AV Peak Grad:      25.6 mmHg AV Mean Grad:      15.0 mmHg LVOT Vmax:         123.00 cm/s LVOT Vmean:        89.100 cm/s LVOT VTI:          0.273 m LVOT/AV VTI ratio: 0.52  AORTA Ao Root diam: 3.20 cm MITRAL VALVE                TRICUSPID VALVE MV Area (PHT): 4.06 cm     TR Peak grad:   28.3 mmHg MV Area VTI:   2.69 cm     TR Vmax:        266.00 cm/s MV Peak grad:  7.2 mmHg MV Mean grad:  2.0 mmHg     SHUNTS MV Vmax:       1.34 m/s     Systemic VTI:  0.27 m MV Vmean:      70.8 cm/s    Systemic Diam: 2.00 cm MV Decel Time: 187 msec MV E velocity: 134.00 cm/s MV A velocity: 70.70 cm/s MV E/A ratio:  1.90 Marcina Millard MD Electronically signed by Marcina Millard MD Signature Date/Time: 12/23/2022/3:18:55 PM    Final    US Venous Img Lower Bilateral  Result Date: 12/20/2022 CLINICAL DATA:  Bilateral lower extremity swelling and pain EXAM: BILATERAL LOWER EXTREMITY  VENOUS DOPPLER ULTRASOUND TECHNIQUE: Gray-scale sonography with graded compression, as well as color Doppler and duplex ultrasound were performed to evaluate the lower extremity deep venous systems from the level of the common femoral vein and including the common femoral, femoral, profunda femoral, popliteal and calf veins including the posterior tibial, peroneal and gastrocnemius veins when  visible. Spectral Doppler was utilized to evaluate flow at rest and with distal augmentation maneuvers in the common femoral, femoral and popliteal veins. COMPARISON:  None Available. FINDINGS: RIGHT LOWER EXTREMITY Common Femoral Vein: No evidence of thrombus. Normal compressibility, respiratory phasicity and response to augmentation. Saphenofemoral Junction: No evidence of thrombus. Normal compressibility and flow on color Doppler imaging. Profunda Femoral Vein: No evidence of thrombus. Normal compressibility and flow on color Doppler imaging. Femoral Vein: No evidence of thrombus. Normal compressibility, respiratory phasicity and response to augmentation. Popliteal Vein: No evidence of thrombus. Normal compressibility, respiratory phasicity and response to augmentation. Calf Veins: No evidence of thrombus. Normal compressibility and flow on color Doppler imaging. LEFT LOWER EXTREMITY Common Femoral Vein: No evidence of thrombus. Normal compressibility, respiratory phasicity and response to augmentation. Saphenofemoral Junction: No evidence of thrombus. Normal compressibility and flow on color Doppler imaging. Profunda Femoral Vein: No evidence of thrombus. Normal compressibility and flow on color Doppler imaging. Femoral Vein: No evidence of thrombus. Normal compressibility, respiratory phasicity and response to augmentation. Popliteal Vein: No evidence of thrombus. Normal compressibility, respiratory phasicity and response to augmentation. Calf Veins: No evidence of thrombus. Normal compressibility and flow on color Doppler imaging. Other Findings: Benign-appearing fatty replaced elongated lymph nodes noted bilaterally. Extremity subcutaneous edema present bilaterally. IMPRESSION: No evidence of deep venous thrombosis in either lower extremity. Electronically Signed   By: Judie Petit.  Shick M.D.   On: 12/20/2022 14:38   DG Chest Portable 1 View  Result Date: 12/20/2022 CLINICAL DATA:  Shortness of breath EXAM:  PORTABLE CHEST 1 VIEW COMPARISON:  X-ray 08/21/2020 FINDINGS: Underinflation with bilateral small pleural effusions, new from previous. Adjacent opacity. Vascular congestion. No pneumothorax. Status post median sternotomy. The heart is enlarged. Overlapping cardiac leads. IMPRESSION: Postop chest. Enlarged cardiopericardial silhouette with vascular congestion and small effusions. Adjacent lung opacities. Recommend follow-up Electronically Signed   By: Karen Kays M.D.   On: 12/20/2022 13:37    LHC 2021  LM lesion is 40% stenosed. Prox RCA lesion is 99% stenosed. Mid RCA lesion is 75% stenosed. Dist RCA lesion is 95% stenosed. RPDA lesion is 90% stenosed. SVG and is normal in caliber. The flow is not reversed. There is no competitive flow SVG and is normal in caliber. The flow is not reversed. There is no competitive flow Prox Graft lesion is 100% stenosed. LIMA and is normal in caliber. The flow is not reversed. There is no competitive flow Origin lesion is 95% stenosed. Previously placed Prox Cx to Dist Cx stent (unknown type) is widely patent. Dist Cx lesion is 90% stenosed. Previously placed Ost LAD to Mid LAD stent (unknown type) is widely patent. Origin to Prox Graft lesion is 100% stenosed. A drug-eluting stent was successfully placed using a STENT RESOLUTE ONYX 2.5X18. Post intervention, there is a 0% residual stenosis. Ost Cx lesion is 70% stenosed. Dist LAD-1 lesion is 50% stenosed. Previously placed Dist LAD-2 stent (unknown type) is widely patent. Ost LAD lesion is 75% stenosed. Preserved left ventricular function ejection fraction of 60%   Conclusion Normal left ventricular function 60% Coronaries Left main large calcified moderate lesion moderate to large calcium Distal LAD large ostial  lesion 75% moderate calcification proximal LAD stent widely patent mid LAD stent widely patent distal LAD diffuse 50% Circumflex is large 70% ostial mid stent widely patent distal  circumflex 95% TIMI-3 flow RCA medium proximal 90% lesion 90% mid distal 75 Grafts LIMA to LAD incompletely evaluated probably occluded SVG to circumflex 100% occluded SVG to PDA 95% ostial in-stent TIMI-3 flow Intervention Successful PCI and stent to in-stent restenosis ostially with 2.5 x 18 mm resolute Onyx to 20 atm Lesion reduced from 95 down to 0% TIMI-3 flow maintained throughout   Successful PCI and stent ostial SVG in-stent restenosis. Lesion reduced from 95 down to 0%  ECHO  NORMAL LEFT VENTRICULAR SYSTOLIC FUNCTION   WITH SEVERE LVH  NORMAL RIGHT VENTRICULAR SYSTOLIC FUNCTION  MODERATE VALVULAR REGURGITATION (See above)  MILD VALVULAR STENOSIS (See above)  IRREGULAR HEART RHYTHM CAPTURED THROUGHOUT EXAM  ESTIMATED LVEF >55%  MODERATE AI  MILD AS; Max Velocity: 3.28m/s; AVA: 1.1cm^2  (PREV. ECHO: 2.45m/s; AVA: 1.9cm^2)  MODERATE - SEVERE MR (MVP: MILD - AMVL)  MODERATE TR; SEVERE PHTN  SEVERE LAE; MILD RAE  MILDLY DILATED ASCENDING AORTA MEASURING UP TO 3.7cm  PATIENT IMAGED SUPINE FROM APICALS TO PEDOFF TO SUBCOSTAL; PATIENT HAD DIFFICULTY LAYING  DOWN; FURTHER EVALUATION DID NOT SHOW ANY SIGNS OF TAMPONADE  _________________________________________________________________________________________  Electronically signed by    Dorothyann Peng, MD on 09/28/2022 07: 54 AM           Performed By: Verdis Prime     Ordering Physician: Dorothyann Peng, MD   TELEMETRY reviewed by me (LT) 12/27/2022 : none available  EKG reviewed by me: AF RBBB and LAFB rate 70  Data reviewed by me (LT) 12/27/2022: Hospitalist progress note, nursing notes last 24h vitals tele labs imaging I/O    Principal Problem:   Acute heart failure with preserved ejection fraction (HFpEF) (HCC) Active Problems:   Insulin dependent type 2 diabetes mellitus (HCC)   Essential hypertension   OSA (obstructive sleep apnea)   CAD (coronary artery disease), native coronary artery   Hypercalcemia   Chronic  venous stasis   Paroxysmal atrial fibrillation (HCC)   Hyponatremia   AKI (acute kidney injury) (HCC)   Severe pulmonary hypertension (HCC)   Vitamin D deficiency   Acute respiratory failure with hypoxia (HCC)   Severe mitral insufficiency   Severe tricuspid regurgitation    ASSESSMENT AND PLAN:  [name] is a 72yoM with a PMH of CAD s/p CABG (LIMA-LAD, SVG - LCX 100% occluded, SVG - PDA s/p PCI (2021) with prior stents, paroxysmal AF (not anticoagulated), HFpEF (severe LVH, mild AS, moderate AI, moderate to severe MR, moderate TR by echo 09/2022) DM2, CKD, OSA, who presented to [facility] for an outpatient left heart cath with Dr. Juliann Pares the morning of 5/9.  He was orthopneic with significant peripheral edema and was ultimately unable to lay flat for the procedure. He was referred to [facility] ED for further evaluation and management of acute on chronic heart failure.  # Acute on chronic HFpEF # Moderate to severe MR # Mild-moderate AS, no AI Presents with 2 months of progressively worsening shortness of breath and exertional chest pain for which LHC was initially recommended by Dr. Juliann Pares.  Over the past 2 weeks his peripheral edema, orthopnea, and dyspnea have significantly worsened, refractory to outpatient torsemide. On admission he was massively volume overloaded on exam with tense woody edema with weeping from wounds on his legs, excellent diuresis on a Lasix drip with Net IO  Since Admission: -18,366.81 mL [12/27/22 0815].  Echo resulted with preserved LVEF, moderate MR, mild to moderate AS without aortic insufficiency visualized -s/p lasix gtt - started po Torsemide 40 mg twice daily 5/15 - hold torsemide x 2 days (until 5/18) with bump in Cr. Will recheck BMP as outpatient next week  - Monitor and replenish electrolytes for a goal K >4, mag >2 -Nitropaste PRN. Cough with PO nitrates. -Continue metoprolol tartrate 25 mg twice daily with hold parameters for HR <60 -Continue  hydralazine 100 mg 3 times daily -Continue spironolactone 25 mg daily  -Further additions of ARB, SGLT2i on an outpatient basis based off repeat BMP -Discussed low-salt diet / alternatives which the patient is motivated to adhere to  # CAD s/p CABG and multiple prior PCI's Chest pain-free at rest, EKGs nonacute, troponins negative.   -Will defer LHC during this hospitalization, plan to reassess at outpatient hospital follow up appointment.  -Continue Plavix 75 mg daily -Aspirin discontinued as Eliquis was started.  # Paroxysmal atrial fibrillation # Pauses Rate 50s-60s this morning without any recurrent pauses. Not historically anticoagulated prior to this admission for unknown reasons.  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   -Continue telemetry monitoring while inpatient -Will continue metoprolol for rate control -Continue rate control, echo with some LA remodeling suggesting that rhythm control may be difficult to achieve -After discussion of risks/benefits on 5/10, patient is agreeable to starting a DOAC at discharge with close monitoring for bleeding side effects and H&H. CHA2DS2-VASc 5 (age, CHF, HTN, CAD)  -Continue Eliquis 5mg  twice daily for stroke prevention.    Ok for discharge today from a cardiac perspective. Will arrange for follow up in clinic with Dr. Juliann Pares in 1 week   This patient's plan of care was discussed and created with Dr. Juliann Pares and he is in agreement.  Signed: Rebeca Allegra , PA-C 12/27/2022, 8:15 AM [facility]

## 2022-12-27 NOTE — Discharge Summary (Signed)
Physician Discharge Summary   Patient: [name] MRN: [id number] DOB: 01-10-1946  Admit date:     12/20/2022  Discharge date: 12/27/22  Discharge Physician: Marrion Coy   PCP: Jaclyn Shaggy, MD   Recommendations at discharge:   Follow-up with PCP in 1 week. Follow-up with cardiology as scheduled. Hold off diuretics for 3 days.  Resume on Sunday. Hold off ARB until seen by cardiology in office. 5.   Check a BMP at the next office visit.  Discharge Diagnoses: Principal Problem:   Acute heart failure with preserved ejection fraction (HFpEF) (HCC) Active Problems:   CAD (coronary artery disease), native coronary artery   Paroxysmal atrial fibrillation (HCC)   Insulin dependent type 2 diabetes mellitus (HCC)   Essential hypertension   OSA (obstructive sleep apnea)   Hypercalcemia   Chronic venous stasis   Hyponatremia   AKI (acute kidney injury) (HCC)   Severe pulmonary hypertension (HCC)   Vitamin D deficiency   Acute respiratory failure with hypoxia (HCC)   Severe mitral insufficiency   Severe tricuspid regurgitation  Resolved Problems:   * No resolved hospital problems. *  Hospital Course: [name] is a 77 y.o. male with medical history significant of CAD s/p CABG and DES, severe pulmonary hypertension, hypertension, hyperlipidemia, OSA, type 2 diabetes, hypothyroidism, who presented to the ED due to shortness of breath.   Recent echocardiogram showed preserved LV systolic function, severe LVH, moderate to severe mitral regurgitation and tricuspid regurgitation and severe pulm hypertension.  Patient was diagnosed with acute on chronic diastolic congestive heart failure, was given IV Lasix drip.  Treatment was switched to torsemide in spironolactone 5/15. Patient renal function slightly worsened today, but short of breath much improved.  Discussed with cardiology, patient still medically stable to be discharged, but hold diuretics for 2 days, hold ARB until next  office visit.   Assessment and Plan: Acute on chronic diastolic congestive heart failure. Moderate to severe mitral regurgitation. Moderate to severe tricuspid regurgitation. Severe pulmonary hypertension. Condition had improved, short of breath much better.  Medically stable to be discharged.  Due to slightly worsening renal function, diuretics can be on hold for the next 2 or 3 days.  Follow-up with PCP and cardiology as outpatient.  ARB will be on hold until seen by cardiology in the office.   Acute hypoxemic respiratory failure secondary to exacerbation of congestive heart failure. Condition has resolved.  Off oxygen.  Acute kidney injury. Hyponatremia. Hypercalcemia. Patient also has chronic hypercalcemia, he also has vitamin D deficiency.  Prior workup with PTH and PTH related peptide 2 years ago were negative.  Started vitamin D supplement. Renal function slightly worse, but stable.  Sodium level is also stable.    Paroxysmal atrial fibrillation. Eliquis was started.   Coronary artery disease. Condition stable, continue Plavix and statin.   Uncontrolled type 2 diabetes with hyperglycemia. Home treatment.         Consultants: Cardiology. Procedures performed: None  Disposition: Home Diet recommendation:  Discharge Diet Orders (From admission, onward)     Start     Ordered   12/27/22 0000  Diet - low sodium heart healthy        05 /16/24 1027           Cardiac diet DISCHARGE MEDICATION: Allergies as of 12/27/2022       Reactions   Isosorbide Cough        Medication List     STOP taking these medications  amLODipine 10 MG tablet Commonly known as: NORVASC   aspirin EC 81 MG tablet   hydrochlorothiazide 12.5 MG tablet Commonly known as: HYDRODIURIL   irbesartan 300 MG tablet Commonly known as: AVAPRO   nebivolol 10 MG tablet Commonly known as: BYSTOLIC       TAKE these medications    acetaminophen 325 MG tablet Commonly known as:  TYLENOL Take 2 tablets (650 mg total) by mouth every 6 (six) hours as needed for mild pain (or Fever >/= 101).   apixaban 5 MG Tabs tablet Commonly known as: ELIQUIS Take 1 tablet (5 mg total) by mouth 2 (two) times daily.   clopidogrel 75 MG tablet Commonly known as: PLAVIX Take 75 mg by mouth daily.   dexlansoprazole 60 MG capsule Commonly known as: DEXILANT Take 1 capsule by mouth daily.   hydrALAZINE 50 MG tablet Commonly known as: APRESOLINE Take 1 tablet by mouth 3 (three) times daily.   insulin glargine 100 UNIT/ML injection Commonly known as: LANTUS Inject 25 Units into the skin at bedtime.   labetalol 100 MG tablet Commonly known as: NORMODYNE Take 1 tablet by mouth 2 (two) times daily.   metFORMIN 500 MG 24 hr tablet Commonly known as: GLUCOPHAGE-XR Take 500 mg by mouth 2 (two) times daily.   rosuvastatin 40 MG tablet Commonly known as: CRESTOR Take 40 mg by mouth daily.   sitaGLIPtin 100 MG tablet Commonly known as: JANUVIA Take 100 mg by mouth daily.   spironolactone 25 MG tablet Commonly known as: ALDACTONE Take 1 tablet (25 mg total) by mouth daily. Start taking on: Dec 30, 2022   Testosterone 1.62 % Gel Apply 2 Pump topically daily. One pump on each arm   torsemide 20 MG tablet Commonly known as: DEMADEX Take 1 tablet (20 mg total) by mouth 2 (two) times daily. Start taking on: Dec 30, 2022 What changed: These instructions start on Dec 30, 2022. If you are unsure what to do until then, ask your doctor or other care provider.   vitamin D3 25 MCG tablet Commonly known as: CHOLECALCIFEROL Take 1 tablet (1,000 Units total) by mouth daily. Start taking on: Dec 28, 2022               Discharge Care Instructions  (From admission, onward)           Start     Ordered   12/27/22 0000  Discharge wound care:       Comments: none   12/27/22 1027            Follow-up Information     Callwood, Dwayne D, MD Follow up in 1 week(s).    Specialties: Cardiology, Internal Medicine Contact information: [address] [place] [address] [phone]         Jaclyn Shaggy, MD Follow up in 1 week(s).   Specialty: Internal Medicine Contact information: [address]   [place] [address] [phone]                Discharge Exam: Ceasar Mons Weights   12/25/22 1013 12/26/22 1104 12/27/22 0831  Weight: 94.9 kg 92.8 kg 92.1 kg   General exam: Appears calm and comfortable  Respiratory system: Clear to auscultation. Respiratory effort normal. Cardiovascular system: S1 & S2 heard, RRR. No JVD, murmurs, rubs, gallops or clicks. No pedal edema. Gastrointestinal system: Abdomen is nondistended, soft and nontender. No organomegaly or masses felt. Normal bowel sounds heard. Central nervous system: Alert and oriented. No  focal neurological deficits. Extremities: Symmetric 5 x 5 power. Skin: No rashes, lesions or ulcers Psychiatry: Judgement and insight appear normal. Mood & affect appropriate.    Condition at discharge: good  The results of significant diagnostics from this hospitalization (including imaging, microbiology, ancillary and laboratory) are listed below for reference.   Imaging Studies: ECHOCARDIOGRAM COMPLETE  Result Date: 12/23/2022    ECHOCARDIOGRAM REPORT   Patient Name:   GRANTLAND MUNNS [facility] Date of Exam: 12/23/2022 Medical Rec #:  [id number]        Height:       73.0 in Accession #:    [id number]       Weight:       220.6 lb Date of Birth:  [birth year]         BSA:          2.243 m Patient Age:    77 years         BP:           147/68 mmHg Patient Gender: M                HR:           49 bpm. Exam Location:  [facility] Procedure: 2D Echo, Color Doppler and Cardiac Doppler Indications:     CHF--acute systolic I50.21  History:         Patient has no prior history of Echocardiogram examinations.                  Arrythmias:Atrial Fibrillation; Risk Factors:Diabetes,                  Hypertension and  Sleep Apnea.  Sonographer:     Cristela Blue Referring Phys:  [id number] [facility] MICHELLE TANG Diagnosing Phys: Marcina Millard MD IMPRESSIONS  1. Left ventricular ejection fraction, by estimation, is 60 to 65%. The left ventricle has normal function. The left ventricle has no regional wall motion abnormalities. Left ventricular diastolic parameters were normal.  2. Right ventricular systolic function is normal. The right ventricular size is normal.  3. Left atrial size was mild to moderately dilated.  4. The mitral valve is normal in structure. Moderate mitral valve regurgitation. No evidence of mitral stenosis.  5. The aortic valve is normal in structure. Aortic valve regurgitation is not visualized. Mild to moderate aortic valve stenosis.  6. The inferior vena cava is normal in size with greater than 50% respiratory variability, suggesting right atrial pressure of 3 mmHg. FINDINGS  Left Ventricle: Left ventricular ejection fraction, by estimation, is 60 to 65%. The left ventricle has normal function. The left ventricle has no regional wall motion abnormalities. The left ventricular internal cavity size was normal in size. There is  no left ventricular hypertrophy. Left ventricular diastolic parameters were normal. Right Ventricle: The right ventricular size is normal. No increase in right ventricular wall thickness. Right ventricular systolic function is normal. Left Atrium: Left atrial size was mild to moderately dilated. Right Atrium: Right atrial size was normal in size. Pericardium: There is no evidence of pericardial effusion. Mitral Valve: The mitral valve is normal in structure. Moderate mitral valve regurgitation. No evidence of mitral valve stenosis. MV peak gradient, 7.2 mmHg. The mean mitral valve gradient is 2.0 mmHg. Tricuspid Valve: The tricuspid valve is normal in structure. Tricuspid valve regurgitation is mild . No evidence of tricuspid stenosis. Aortic Valve: The aortic valve is normal in structure.  Aortic valve regurgitation is not visualized. Mild to moderate aortic stenosis  is present. Aortic valve mean gradient measures 15.0 mmHg. Aortic valve peak gradient measures 25.6 mmHg. Aortic valve area, by VTI measures 1.63 cm. Pulmonic Valve: The pulmonic valve was normal in structure. Pulmonic valve regurgitation is not visualized. No evidence of pulmonic stenosis. Aorta: The aortic root is normal in size and structure. Venous: The inferior vena cava is normal in size with greater than 50% respiratory variability, suggesting right atrial pressure of 3 mmHg. IAS/Shunts: No atrial level shunt detected by color flow Doppler.  LEFT VENTRICLE PLAX 2D LVIDd:         3.60 cm   Diastology LVIDs:         2.30 cm   LV e' medial:    6.42 cm/s LV PW:         1.90 cm   LV E/e' medial:  20.9 LV IVS:        1.80 cm   LV e' lateral:   9.25 cm/s LVOT diam:     2.00 cm   LV E/e' lateral: 14.5 LV SV:         86 LV SV Index:   38 LVOT Area:     3.14 cm  RIGHT VENTRICLE RV Basal diam:  5.00 cm RV Mid diam:    3.50 cm RV S prime:     13.70 cm/s TAPSE (M-mode): 2.5 cm LEFT ATRIUM             Index        RIGHT ATRIUM           Index LA diam:        4.90 cm 2.18 cm/m   RA Area:     21.20 cm LA Vol (A2C):   27.6 ml 12.30 ml/m  RA Volume:   64.70 ml  28.84 ml/m LA Vol (A4C):   57.9 ml 25.81 ml/m LA Biplane Vol: 43.3 ml 19.30 ml/m  AORTIC VALVE AV Area (Vmax):    1.53 cm AV Area (Vmean):   1.55 cm AV Area (VTI):     1.63 cm AV Vmax:           253.00 cm/s AV Vmean:          180.667 cm/s AV VTI:            0.525 m AV Peak Grad:      25.6 mmHg AV Mean Grad:      15.0 mmHg LVOT Vmax:         123.00 cm/s LVOT Vmean:        89.100 cm/s LVOT VTI:          0.273 m LVOT/AV VTI ratio: 0.52  AORTA Ao Root diam: 3.20 cm MITRAL VALVE                TRICUSPID VALVE MV Area (PHT): 4.06 cm     TR Peak grad:   28.3 mmHg MV Area VTI:   2.69 cm     TR Vmax:        266.00 cm/s MV Peak grad:  7.2 mmHg MV Mean grad:  2.0 mmHg     SHUNTS MV Vmax:        1.34 m/s     Systemic VTI:  0.27 m MV Vmean:      70.8 cm/s    Systemic Diam: 2.00 cm MV Decel Time: 187 msec MV E velocity: 134.00 cm/s MV A velocity: 70.70 cm/s MV E/A ratio:  1.90 Marcina Millard MD Electronically signed by Lyn Hollingshead  Paraschos MD Signature Date/Time: 12/23/2022/3:18:55 PM    Final    US Venous Img Lower Bilateral  Result Date: 12/20/2022 CLINICAL DATA:  Bilateral lower extremity swelling and pain EXAM: BILATERAL LOWER EXTREMITY VENOUS DOPPLER ULTRASOUND TECHNIQUE: Gray-scale sonography with graded compression, as well as color Doppler and duplex ultrasound were performed to evaluate the lower extremity deep venous systems from the level of the common femoral vein and including the common femoral, femoral, profunda femoral, popliteal and calf veins including the posterior tibial, peroneal and gastrocnemius veins when visible. Spectral Doppler was utilized to evaluate flow at rest and with distal augmentation maneuvers in the common femoral, femoral and popliteal veins. COMPARISON:  None Available. FINDINGS: RIGHT LOWER EXTREMITY Common Femoral Vein: No evidence of thrombus. Normal compressibility, respiratory phasicity and response to augmentation. Saphenofemoral Junction: No evidence of thrombus. Normal compressibility and flow on color Doppler imaging. Profunda Femoral Vein: No evidence of thrombus. Normal compressibility and flow on color Doppler imaging. Femoral Vein: No evidence of thrombus. Normal compressibility, respiratory phasicity and response to augmentation. Popliteal Vein: No evidence of thrombus. Normal compressibility, respiratory phasicity and response to augmentation. Calf Veins: No evidence of thrombus. Normal compressibility and flow on color Doppler imaging. LEFT LOWER EXTREMITY Common Femoral Vein: No evidence of thrombus. Normal compressibility, respiratory phasicity and response to augmentation. Saphenofemoral Junction: No evidence of thrombus. Normal  compressibility and flow on color Doppler imaging. Profunda Femoral Vein: No evidence of thrombus. Normal compressibility and flow on color Doppler imaging. Femoral Vein: No evidence of thrombus. Normal compressibility, respiratory phasicity and response to augmentation. Popliteal Vein: No evidence of thrombus. Normal compressibility, respiratory phasicity and response to augmentation. Calf Veins: No evidence of thrombus. Normal compressibility and flow on color Doppler imaging. Other Findings: Benign-appearing fatty replaced elongated lymph nodes noted bilaterally. Extremity subcutaneous edema present bilaterally. IMPRESSION: No evidence of deep venous thrombosis in either lower extremity. Electronically Signed   By: Judie Petit.  Shick M.D.   On: 12/20/2022 14:38   DG Chest Portable 1 View  Result Date: 12/20/2022 CLINICAL DATA:  Shortness of breath EXAM: PORTABLE CHEST 1 VIEW COMPARISON:  X-ray 08/21/2020 FINDINGS: Underinflation with bilateral small pleural effusions, new from previous. Adjacent opacity. Vascular congestion. No pneumothorax. Status post median sternotomy. The heart is enlarged. Overlapping cardiac leads. IMPRESSION: Postop chest. Enlarged cardiopericardial silhouette with vascular congestion and small effusions. Adjacent lung opacities. Recommend follow-up Electronically Signed   By: Karen Kays M.D.   On: 12/20/2022 13:37    Microbiology: Results for orders placed or performed during the hospital encounter of 12/20/22  SARS Coronavirus 2 by RT PCR (hospital order, performed in [facility] hospital lab) *cepheid single result test* Anterior Nasal Swab     Status: None   Collection Time: 12/20/22 12:58 PM   Specimen: Anterior Nasal Swab  Result Value Ref Range Status   SARS Coronavirus 2 by RT PCR NEGATIVE NEGATIVE Final    Comment: (NOTE) SARS-CoV-2 target nucleic acids are NOT DETECTED.  The SARS-CoV-2 RNA is generally detectable in upper and lower respiratory specimens during the acute  phase of infection. The lowest concentration of SARS-CoV-2 viral copies this assay can detect is 250 copies / mL. A negative result does not preclude SARS-CoV-2 infection and should not be used as the sole basis for treatment or other patient management decisions.  A negative result may occur with improper specimen collection / handling, submission of specimen other than nasopharyngeal swab, presence of viral mutation(s) within the areas targeted by this assay, and inadequate  number of viral copies (<250 copies / mL). A negative result must be combined with clinical observations, patient history, and epidemiological information.  Fact Sheet for Patients:   RoadLapTop.co.za  Fact Sheet for Healthcare Providers: http://kim-miller.com/  This test is not yet approved or  cleared by the [place] FDA and has been authorized for detection and/or diagnosis of SARS-CoV-2 by FDA under an Emergency Use Authorization (EUA).  This EUA will remain in effect (meaning this test can be used) for the duration of the COVID-19 declaration under Section 564(b)(1) of the Act, 21 U.S.C. section 360bbb-3(b)(1), unless the authorization is terminated or revoked sooner.  Performed at [facility], [address] [place], [place] [address]     Labs: CBC: Recent Labs  Lab 12/20/22 1258 12/21/22 6045 12/22/22 0454 12/23/22 0604 12/24/22 0558 12/25/22 0512 12/26/22 0607 12/27/22 0514  WBC 6.5 7.2   < > 5.5 8.5 8.4 9.7 8.8  NEUTROABS 5.3 6.2  --   --   --   --   --   --   HGB 11.0* 11.3*   < > 10.5* 11.3* 10.9* 11.5* 11.4*  HCT 35.7* 36.1*   < > 33.0* 35.0* 33.9* 36.5* 35.1*  MCV 94.9 94.3   < > 92.7 91.6 90.9 91.3 91.2  PLT 141* 125*   < > 131* 137* 159 200 224   < > = values in this interval not displayed.   Basic Metabolic Panel: Recent Labs  Lab 12/23/22 0604 12/24/22 0558 12/25/22 0512 12/26/22 0607 12/27/22 0514  NA 135 134*  135 134* 133*  K 3.4* 3.9 4.1 4.3 4.0  CL 95* 90* 90* 90* 89*  CO2 32 33* 34* 34* 33*  GLUCOSE 146* 156* 131* 158* 152*  BUN 27* 29* 36* 44* 50*  CREATININE 1.40* 1.37* 1.44* 1.63* 1.73*  CALCIUM 10.2 10.4* 10.4* 10.8* 10.6*  MG 1.9 2.0 2.0 2.1 2.3   Liver Function Tests: Recent Labs  Lab 12/20/22 1258  AST 22  ALT 21  ALKPHOS 141*  BILITOT 1.5*  PROT 7.8  ALBUMIN 3.9   CBG: Recent Labs  Lab 12/26/22 0751 12/26/22 1209 12/26/22 1923 12/26/22 2103 12/27/22 0823  GLUCAP 159* 241* 203* 152* 150*    Discharge time spent: greater than 30 minutes.  Signed: Marrion Coy, MD [facility] 12/27/2022

## 2022-12-27 NOTE — Discharge Instructions (Signed)
Hold off torsemide out and Aldactone until Sunday. Follow-up with PCP in 1 week. Follow-up with cardiology as scheduled.

## 2022-12-27 NOTE — Progress Notes (Signed)
Mobility Specialist - Progress Note   12/27/22 0954  Mobility  Activity Ambulated independently in hallway  Level of Assistance Independent  Assistive Device None  Distance Ambulated (ft) 160 ft  Activity Response Tolerated well  Mobility Referral Yes  $Mobility charge 1 Mobility  Mobility Specialist Start Time (ACUTE ONLY) 0914  Mobility Specialist Stop Time (ACUTE ONLY) [id number]  Mobility Specialist Time Calculation (min) (ACUTE ONLY) 7 min   Pt sitting in recliner on RA upon arrival. Pt STS and ambulates 1 lap around NS indep. Pt returns to recliner with needs in reach.   Terrilyn Saver  Mobility Specialist  12/27/22 9:55 AM

## 2022-12-31 ENCOUNTER — Inpatient Hospital Stay: Payer: Medicare HMO | Attending: Oncology

## 2022-12-31 DIAGNOSIS — D508 Other iron deficiency anemias: Secondary | ICD-10-CM

## 2022-12-31 DIAGNOSIS — D509 Iron deficiency anemia, unspecified: Secondary | ICD-10-CM | POA: Diagnosis present

## 2022-12-31 LAB — CBC WITH DIFFERENTIAL/PLATELET
Abs Immature Granulocytes: 0.03 10*3/uL (ref 0.00–0.07)
Basophils Absolute: 0.1 10*3/uL (ref 0.0–0.1)
Basophils Relative: 1 %
Eosinophils Absolute: 0.1 10*3/uL (ref 0.0–0.5)
Eosinophils Relative: 2 %
HCT: 34.7 % — ABNORMAL LOW (ref 39.0–52.0)
Hemoglobin: 10.8 g/dL — ABNORMAL LOW (ref 13.0–17.0)
Immature Granulocytes: 1 %
Lymphocytes Relative: 10 %
Lymphs Abs: 0.6 10*3/uL — ABNORMAL LOW (ref 0.7–4.0)
MCH: 29.1 pg (ref 26.0–34.0)
MCHC: 31.1 g/dL (ref 30.0–36.0)
MCV: 93.5 fL (ref 80.0–100.0)
Monocytes Absolute: 0.6 10*3/uL (ref 0.1–1.0)
Monocytes Relative: 10 %
Neutro Abs: 4.5 10*3/uL (ref 1.7–7.7)
Neutrophils Relative %: 76 %
Platelets: 189 10*3/uL (ref 150–400)
RBC: 3.71 MIL/uL — ABNORMAL LOW (ref 4.22–5.81)
RDW: 16 % — ABNORMAL HIGH (ref 11.5–15.5)
WBC: 5.8 10*3/uL (ref 4.0–10.5)
nRBC: 0 % (ref 0.0–0.2)

## 2022-12-31 LAB — VITAMIN B12: Vitamin B-12: 473 pg/mL (ref 180–914)

## 2022-12-31 LAB — IRON AND TIBC
Iron: 39 ug/dL — ABNORMAL LOW (ref 45–182)
Saturation Ratios: 14 % — ABNORMAL LOW (ref 17.9–39.5)
TIBC: 290 ug/dL (ref 250–450)
UIBC: 251 ug/dL

## 2022-12-31 LAB — FERRITIN: Ferritin: 262 ng/mL (ref 24–336)

## 2022-12-31 NOTE — CV Procedure (Signed)
BP 130/75 Hr 70  RR 20 Temp 97.6  Patient is a 77 year old obese male appeared to be an moderate respiratory distress unable to complete full sentences unable to lie flat   Patient was brought to the cardiac Cath Lab as an outpatient because of heart failure symptoms shortness of breath chest pain multivessel coronary disease  Patient was put on a table but had extreme dyspnea hypoxemia shortness of breath inability to lie flat  Because of the patient's extreme dyspnea the procedure.  Not to be safe to perform and was then canceled and patient was admitted for further evaluation and management of heart failure.  Hospitalist were called to admit and treat for heart failure including diuretics supplemental oxygen  Patient was to be placed on a Lasix drip and defer catheterization for now until further management  [facility]

## 2023-01-03 ENCOUNTER — Encounter: Payer: Medicare HMO | Attending: Physician Assistant | Admitting: Physician Assistant

## 2023-01-03 ENCOUNTER — Encounter: Payer: Medicare HMO | Admitting: Family

## 2023-01-03 DIAGNOSIS — Z87891 Personal history of nicotine dependence: Secondary | ICD-10-CM | POA: Insufficient documentation

## 2023-01-03 DIAGNOSIS — I130 Hypertensive heart and chronic kidney disease with heart failure and stage 1 through stage 4 chronic kidney disease, or unspecified chronic kidney disease: Secondary | ICD-10-CM | POA: Insufficient documentation

## 2023-01-03 DIAGNOSIS — N189 Chronic kidney disease, unspecified: Secondary | ICD-10-CM | POA: Insufficient documentation

## 2023-01-03 DIAGNOSIS — E1122 Type 2 diabetes mellitus with diabetic chronic kidney disease: Secondary | ICD-10-CM | POA: Insufficient documentation

## 2023-01-03 DIAGNOSIS — E11622 Type 2 diabetes mellitus with other skin ulcer: Secondary | ICD-10-CM | POA: Insufficient documentation

## 2023-01-03 DIAGNOSIS — Z794 Long term (current) use of insulin: Secondary | ICD-10-CM | POA: Insufficient documentation

## 2023-01-03 DIAGNOSIS — I480 Paroxysmal atrial fibrillation: Secondary | ICD-10-CM | POA: Diagnosis not present

## 2023-01-03 DIAGNOSIS — I87333 Chronic venous hypertension (idiopathic) with ulcer and inflammation of bilateral lower extremity: Secondary | ICD-10-CM | POA: Diagnosis present

## 2023-01-03 DIAGNOSIS — Z7901 Long term (current) use of anticoagulants: Secondary | ICD-10-CM | POA: Insufficient documentation

## 2023-01-03 DIAGNOSIS — L97812 Non-pressure chronic ulcer of other part of right lower leg with fat layer exposed: Secondary | ICD-10-CM | POA: Diagnosis not present

## 2023-01-03 DIAGNOSIS — I2510 Atherosclerotic heart disease of native coronary artery without angina pectoris: Secondary | ICD-10-CM | POA: Diagnosis not present

## 2023-01-03 DIAGNOSIS — I455 Other specified heart block: Secondary | ICD-10-CM | POA: Diagnosis not present

## 2023-01-04 NOTE — Progress Notes (Signed)
Jerry, Fitzgerald ([id number]) [id number] Page 1 of 4 Visit Report for 01/03/2023 Abuse Risk Screen Details Patient Name: Date of Service: [name], [name] 01/03/2023 9:30 Jerry M Medical Record Number: [id number] Patient Account Number: [id number] Date of Birth/Sex: Treating RN: 01-May-1946 (77 y.o. [name]) Yevonne Pax Primary Care Shawnette Augello: Dewaine Oats Other Clinician: Referring Fouad Taul: Treating Angelynn Lemus/Extender: Dennie Bible Weeks in Treatment: 0 Abuse Risk Screen Items Answer ABUSE RISK SCREEN: Has anyone close to you tried to hurt or harm you recentlyo No Do you feel uncomfortable with anyone in your familyo No Has anyone forced you do things that you didnt want to doo No Electronic Signature(s) Signed: 01/03/2023 3:57:39 PM By: Yevonne Pax RN Entered By: Yevonne Pax on 01/03/2023 09:54:41 -------------------------------------------------------------------------------- Activities of Daily Living Details Patient Name: Date of Service: [name], [name] 01/03/2023 9:30 Jerry M Medical Record Number: [id number] Patient Account Number: [id number] Date of Birth/Sex: Treating RN: 01/09/1946 (77 y.o. [name]) Yevonne Pax Primary Care Mennie Spiller: Dewaine Oats Other Clinician: Referring Nansi Birmingham: Treating Avaleen Brownley/Extender: Dennie Bible Weeks in Treatment: 0 Activities of Daily Living Items Answer Activities of Daily Living (Please select one for each item) Drive Automobile Completely Able T Medications ake Completely Able Use T elephone Completely Able Care for Appearance Completely Able Use T oilet Completely Able Bath / Shower Completely Able Dress Self Completely Able Feed Self Completely Able Walk Completely Able Get In / Out Bed Completely Able Housework Completely Able Prepare Meals Completely Able Handle Money Completely Able Shop for Self Completely Able Electronic Signature(s) Signed: 01/03/2023 3:57:39 PM By: Yevonne Pax RN Entered By: Yevonne Pax on 01/03/2023 09:55:15 -------------------------------------------------------------------------------- Education Screening Details Patient Name: Date of Service: [name], [name] 01/03/2023 9:30 Jerry M Medical Record Number: [id number] Patient Account Number: [id number] Date of Birth/Sex: Treating RN: 17-Jul-1946 (77 y.o. [name] Primary Care Jenness Stemler: Dewaine Oats Other Clinician: Referring Lanijah Warzecha: Treating Gailene Youkhana/Extender: Dennie Bible Weeks in Treatment: 0 SATYA, HEADY P ([id number]) [id number] Page 2 of 4 Primary Learner Assessed: Patient Learning Preferences/Education Level/Primary Language Learning Preference: Explanation Preferred Language: English Cognitive Barrier Language Barrier: No Translator Needed: No Memory Deficit: No Emotional Barrier: No Cultural/Religious Beliefs Affecting Medical Care: No Physical Barrier Impaired Vision: No Impaired Hearing: No Decreased Hand dexterity: No Knowledge/Comprehension Knowledge Level: Medium Comprehension Level: High Ability to understand written instructions: High Ability to understand verbal instructions: High Motivation Anxiety Level: Anxious Cooperation: Cooperative Education Importance: Acknowledges Need Interest in Health Problems: Asks Questions Perception: Coherent Willingness to Engage in Self-Management High Activities: Readiness to Engage in Self-Management High Activities: Electronic Signature(s) Signed: 01/03/2023 3:57:39 PM By: Yevonne Pax RN Entered By: Yevonne Pax on 01/03/2023 09:55:56 -------------------------------------------------------------------------------- Fall Risk Assessment Details Patient Name: Date of Service: [name], [name] 01/03/2023 9:30 Jerry M Medical Record Number: [id number] Patient Account Number: [id number] Date of Birth/Sex: Treating RN: 27-Jan-1946 (77 y.o. [name]) Yevonne Pax Primary  Care Rhyanna Sorce: Dewaine Oats Other Clinician: Referring Zafiro Routson: Treating Reah Justo/Extender: Dennie Bible Weeks in Treatment: 0 Fall Risk Assessment Items Have you had 2 or more falls in the last 12 monthso 0 No Have you had any fall that resulted in injury in the last 12 monthso 0 No FALLS RISK SCREEN History of falling - immediate or within 3 months 0 No Secondary diagnosis (Do you have 2 or more medical diagnoseso) 0 No Ambulatory aid None/bed rest/wheelchair/nurse 0 Yes Crutches/cane/walker 0 No Furniture 0 No  Intravenous therapy Access/Saline/Heparin Lock 0 No Gait/Transferring Normal/ bed rest/ wheelchair 0 Yes Weak (short steps with or without shuffle, stooped but able to lift head while walking, may seek 0 No support from furniture) Impaired (short steps with shuffle, may have difficulty arising from chair, head down, impaired 0 No balance) Mental Status Oriented to own ability 0 Yes Overestimates or forgets limitations 0 No Risk Level: Low Risk Score: 0 Fitzgerald, Jerry P ([id number]) [phone] [id number].pdf Page 3 of 4 Electronic Signature(s) -------------------------------------------------------------------------------- Foot Assessment Details Patient Name: Date of Service: [name], [name] 01/03/2023 9:30 Jerry M Medical Record Number: [id number] Patient Account Number: [id number] Date of Birth/Sex: Treating RN: Jul 21, 1946 (77 y.o. [name]) Yevonne Pax Primary Care Jamesha Ellsworth: Dewaine Oats Other Clinician: Referring Alexsus Papadopoulos: Treating Bakary Bramer/Extender: Dennie Bible Weeks in Treatment: 0 Foot Assessment Items Site Locations + = Sensation present, - = Sensation absent, C = Callus, U = Ulcer R = Redness, W = Warmth, M = Maceration, PU = Pre-ulcerative lesion F = Fissure, S = Swelling, D = Dryness Assessment Right: Left: Other Deformity: No No Prior Foot Ulcer: No No Prior Amputation: No No Charcot Joint: No No Ambulatory  Status: Ambulatory Without Help Gait: Steady Electronic Signature(s) Signed: 01/03/2023 3:57:39 PM By: Yevonne Pax RN Entered By: Yevonne Pax on 01/03/2023 10:09:11 -------------------------------------------------------------------------------- Nutrition Risk Screening Details Patient Name: Date of Service: [name], [name] 01/03/2023 9:30 Jerry M Medical Record Number: [id number] Patient Account Number: [id number] Date of Birth/Sex: Treating RN: [birth year] (77 y.o. [name] Primary Care Anyia Gierke: Dewaine Oats Other Clinician: Referring Rhena Glace: Treating Carter Kaman/Extender: Dennie Bible Weeks in Treatment: 0 Height (in): 73 Weight (lbs): 202 Body Mass Index (BMI): 26.6 Clemon, Dakoda P ([id number]) [id number] [id number].pdf Page 4 of 4 Nutrition Risk Screening Items Score Screening NUTRITION RISK SCREEN: I have an illness or condition that made me change the kind and/or amount of food I eat 0 No I eat fewer than two meals per day 0 No I eat few fruits and vegetables, or milk products 0 No I have three or more drinks of beer, liquor or wine almost every day 0 No I have tooth or mouth problems that make it hard for me to eat 0 No I don't always have enough money to buy the food I need 0 No I eat alone most of the time 0 No I take three or more different prescribed or over-the-counter drugs Jerry day 0 No Without wanting to, I have lost or gained 10 pounds in the last six months 0 No I am not always physically able to shop, cook and/or feed myself 0 No Nutrition Protocols Good Risk Protocol Moderate Risk Protocol High Risk Proctocol Risk Level: Good Risk Score: 0 Electronic Signature(s) Signed: 01/03/2023 3:57:39 PM By: Yevonne Pax RN Entered By: Yevonne Pax on 01/03/2023 09:56:58

## 2023-01-15 ENCOUNTER — Encounter: Payer: Medicare HMO | Attending: Physician Assistant | Admitting: Physician Assistant

## 2023-01-15 DIAGNOSIS — I87333 Chronic venous hypertension (idiopathic) with ulcer and inflammation of bilateral lower extremity: Secondary | ICD-10-CM | POA: Diagnosis present

## 2023-01-15 DIAGNOSIS — E1122 Type 2 diabetes mellitus with diabetic chronic kidney disease: Secondary | ICD-10-CM | POA: Diagnosis not present

## 2023-01-15 DIAGNOSIS — L97812 Non-pressure chronic ulcer of other part of right lower leg with fat layer exposed: Secondary | ICD-10-CM | POA: Diagnosis not present

## 2023-01-15 DIAGNOSIS — Z794 Long term (current) use of insulin: Secondary | ICD-10-CM | POA: Insufficient documentation

## 2023-01-15 DIAGNOSIS — I480 Paroxysmal atrial fibrillation: Secondary | ICD-10-CM | POA: Insufficient documentation

## 2023-01-15 DIAGNOSIS — I2510 Atherosclerotic heart disease of native coronary artery without angina pectoris: Secondary | ICD-10-CM | POA: Insufficient documentation

## 2023-01-15 DIAGNOSIS — E11622 Type 2 diabetes mellitus with other skin ulcer: Secondary | ICD-10-CM | POA: Insufficient documentation

## 2023-01-15 DIAGNOSIS — N189 Chronic kidney disease, unspecified: Secondary | ICD-10-CM | POA: Diagnosis not present

## 2023-01-15 DIAGNOSIS — Z7901 Long term (current) use of anticoagulants: Secondary | ICD-10-CM | POA: Diagnosis not present

## 2023-01-15 DIAGNOSIS — I130 Hypertensive heart and chronic kidney disease with heart failure and stage 1 through stage 4 chronic kidney disease, or unspecified chronic kidney disease: Secondary | ICD-10-CM | POA: Diagnosis not present

## 2023-01-15 DIAGNOSIS — I455 Other specified heart block: Secondary | ICD-10-CM | POA: Diagnosis not present

## 2023-01-15 DIAGNOSIS — Z87891 Personal history of nicotine dependence: Secondary | ICD-10-CM | POA: Insufficient documentation

## 2023-01-17 NOTE — Progress Notes (Signed)
01/21/23 10:50 AM   [name] 1945/11/19 [id number]  Referring provider:  Jaclyn Shaggy, MD [address]   [place],  [place] [address]  Urological history  1. Testosterone deficiency -contributing factors of age, obesity, diabetes and CKD -testosterone (01/2023) 320 -Hemoglobin/hematocrit (01/2023) 10.0/30.4 -testosterone gel 1.62%, 2 pumps daily   2. BPH with LU TS -PSA (01/2023) 0.38   3. ED -contributing factors of age, BPH, testosterone deficiency, DM, HTN, HLD, sleep apnea, CHF and CAD  - SHIM deferred   4. Sleep apnea -untreated    5. Urethroplasty as a child   Chief Complaint  Patient presents with   Hypogonadism      HPI: [name] is a 77 y.o.male who presents today for a 6 month follow-up.     He has noticed a large lump in his right lower quadrant.  He believes it is a hernia mass that bulges out when he lifts things had been and he can rub it and the lump will abate.  He has not Development worker, international aid.  He also has a lesion on his left ear which he states has been there for a year after it was hit by a branch and would not heal.  He has not seen a dermatologist.  I PSS 24/4  Complaints of urinary frequency, but he is on high doses of diuretic due to his congestive heart failure.  Patient denies any modifying or aggravating factors.  Patient denies any gross hematuria, dysuria or suprapubic/flank pain.  Patient denies any fevers, chills, nausea or vomiting.     IPSS     Row Name 01/21/23 1000         International Prostate Symptom Score   How often have you had the sensation of not emptying your bladder? Less than half the time     How often have you had to urinate less than every two hours? More than half the time     How often have you found you stopped and started again several times when you urinated? More than half the time     How often have you found it difficult to postpone urination? More than half the time     How often have you  had a weak urinary stream? About half the time     How often have you had to strain to start urination? Less than half the time     How many times did you typically get up at night to urinate? 5 Times     Total IPSS Score 24       Quality of Life due to urinary symptoms   If you were to spend the rest of your life with your urinary condition just the way it is now how would you feel about that? 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    Chronic kidney disease    patient not aware of this dx   Coronary artery disease    Diabetes mellitus without complication (HCC)    type 2   Dyspnea    GERD (gastroesophageal reflux disease)    History of blood transfusion    Hypertension    Sleep apnea  does not use cpap    Surgical History: Past Surgical History:  Procedure Laterality Date   APPENDECTOMY     CARDIAC CATHETERIZATION Left 08/16/2016   Procedure: Left Heart Cath and Coronary Angiography;  Surgeon: Alwyn Pea, MD;  Location: [facility]IVE CV [facility];  Service: Cardiovascular;  Laterality: Left;   COLON SURGERY     part of colon removed   COLONOSCOPY N/A 09/07/2020   Procedure: COLONOSCOPY;  Surgeon: Pasty Spillers, MD;  Location: [facility] ENDOSCOPY;  Service: Endoscopy;  Laterality: N/A;   COLONOSCOPY WITH PROPOFOL N/A 05/17/2021   Procedure: COLONOSCOPY WITH PROPOFOL;  Surgeon: Pasty Spillers, MD;  Location: [facility] ENDOSCOPY;  Service: Endoscopy;  Laterality: N/A;   CORONARY ARTERY BYPASS GRAFT  08/30/2016   triple   CORONARY STENT INTERVENTION N/A 06/09/2020   Procedure: CORONARY STENT INTERVENTION;  Surgeon: Alwyn Pea, MD;  Location: [facility];  Service: Cardiovascular;  Laterality: N/A;   ENDOSCOPIC MUCOSAL RESECTION N/A 09/22/2020   Procedure:  ENDOSCOPIC MUCOSAL RESECTION;  Surgeon: Meridee Score Netty Starring., MD;  Location: [facility] ENDOSCOPY;  Service: Gastroenterology;  Laterality: N/A;   ESOPHAGOGASTRODUODENOSCOPY N/A 09/07/2020   Procedure: ESOPHAGOGASTRODUODENOSCOPY (EGD);  Surgeon: Pasty Spillers, MD;  Location: [facility] ENDOSCOPY;  Service: Endoscopy;  Laterality: N/A;   ESOPHAGOGASTRODUODENOSCOPY (EGD) WITH PROPOFOL N/A 09/22/2020   Procedure: ESOPHAGOGASTRODUODENOSCOPY (EGD) WITH PROPOFOL;  Surgeon: Meridee Score Netty Starring., MD;  Location: [facility] ENDOSCOPY;  Service: Gastroenterology;  Laterality: N/A;   FRACTURE SURGERY     HEMOSTASIS CLIP PLACEMENT  09/22/2020   Procedure: HEMOSTASIS CLIP PLACEMENT;  Surgeon: Lemar Lofty., MD;  Location: [facility] ENDOSCOPY;  Service: Gastroenterology;;   HEMOSTASIS CONTROL  09/22/2020   Procedure: HEMOSTASIS CONTROL;  Surgeon: Lemar Lofty., MD;  Location: [facility] ENDOSCOPY;  Service: Gastroenterology;;   LEFT HEART CATH AND CORONARY ANGIOGRAPHY Left 06/09/2020   Procedure: LEFT HEART CATH AND CORONARY ANGIOGRAPHY;  Surgeon: Alwyn Pea, MD;  Location: [facility] CV [facility];  Service: Cardiovascular;  Laterality: Left;   LEFT HEART CATH AND CORS/GRAFTS ANGIOGRAPHY Left 11/20/2016   Procedure: Left Heart Cath and Cors/Grafts Angiography;  Surgeon: Alwyn Pea, MD;  Location: [facility] CV [facility];  Service: Cardiovascular;  Laterality: Left;   POLYPECTOMY  09/22/2020   Procedure: POLYPECTOMY;  Surgeon: Mansouraty, Netty Starring., MD;  Location: [facility] ENDOSCOPY;  Service: Gastroenterology;;   SUBMUCOSAL LIFTING INJECTION  09/22/2020   Procedure: SUBMUCOSAL LIFTING INJECTION;  Surgeon: Lemar Lofty., MD;  Location: [facility] ENDOSCOPY;  Service: Gastroenterology;;    Home Medications:  Allergies as of 01/21/2023       Reactions   Isosorbide Cough        Medication List        Accurate as of January 21, 2023 10:50 AM. If you have any questions, ask your nurse or doctor.           acetaminophen 325 MG tablet Commonly known as: TYLENOL Take 2 tablets (650 mg total) by mouth every 6 (six) hours as needed for mild pain (or Fever >/= 101).   amLODipine 10 MG tablet Commonly known as: NORVASC Take 10 mg by mouth daily.   apixaban 5 MG Tabs tablet Commonly known as: ELIQUIS Take 1 tablet (5 mg total) by mouth 2 (two) times daily.   clopidogrel 75 MG tablet Commonly known as: PLAVIX Take 75 mg by mouth daily.   dexlansoprazole 60 MG capsule Commonly known as: DEXILANT Take 1 capsule by mouth daily.   hydrALAZINE 50 MG tablet Commonly known  as: APRESOLINE Take 1 tablet by mouth 3 (three) times daily.   insulin glargine 100 UNIT/ML injection Commonly known as: LANTUS Inject 25 Units into the skin at bedtime.   irbesartan 300 MG tablet Commonly known as: AVAPRO Take 300 mg by mouth daily.   labetalol 100 MG tablet Commonly known as: NORMODYNE Take 1 tablet by mouth 2 (two) times daily.   metFORMIN 500 MG 24 hr tablet Commonly known as: GLUCOPHAGE-XR Take 500 mg by mouth 2 (two) times daily.   rosuvastatin 40 MG tablet Commonly known as: CRESTOR Take 40 mg by mouth daily.   sitaGLIPtin 100 MG tablet Commonly known as: JANUVIA Take 100 mg by mouth daily.   spironolactone 25 MG tablet Commonly known as: ALDACTONE Take 1 tablet (25 mg total) by mouth daily.   Testosterone 1.62 % Gel Apply 2 Pump topically daily. One pump on each arm   torsemide 20 MG tablet Commonly known as: DEMADEX Take 1 tablet (20 mg total) by mouth 2 (two) times daily.   triamcinolone ointment 0.1 % Commonly known as: KENALOG Apply 1 Application topically.   vitamin D3 25 MCG tablet Commonly known as: CHOLECALCIFEROL Take 1 tablet (1,000 Units total) by mouth daily.        Allergies:  Allergies  Allergen Reactions   Isosorbide Cough    Family History: No family history on file.  Social History:  reports that he has quit smoking. His smoking use  included cigarettes. He has never used smokeless tobacco. He reports that he does not drink alcohol and does not use drugs.   Physical Exam: BP 133/65 (BP Location: Left Arm, Patient Position: Sitting, Cuff Size: Normal)   Pulse 63   Ht 6\' 1"  (1.854 m)   Wt 184 lb 3.2 oz (83.6 kg)   BMI 24.30 kg/m   Constitutional:  Well nourished. Alert and oriented, No acute distress. HEENT: [place] AT, moist mucus membranes.  Trachea midline.  Left pinna of ear with a scab and a 1 cm line of erythema. Cardiovascular: No clubbing, cyanosis, or edema. Respiratory: Normal respiratory effort, no increased work of breathing. Abdomen: A large reducible right inguinal hernia is noted.  Also noted is a umbilical hernia that is easily reducible. GU: No CVA tenderness.  No bladder fullness or masses.   Neurologic: Grossly intact, no focal deficits, moving all 4 extremities. Psychiatric: Normal mood and affect.   Laboratory Data:    Latest Ref Rng & Units 12/27/2022    5:14 AM 12/26/2022    6:07 AM 12/25/2022    5:12 AM  BMP  Glucose 70 - 99 mg/dL 161  096  045   BUN 8 - 23 mg/dL 50  44  36   Creatinine 0.61 - 1.24 mg/dL 4.09  8.11  9.14   Sodium 135 - 145 mmol/L 133  134  135   Potassium 3.5 - 5.1 mmol/L 4.0  4.3  4.1   Chloride 98 - 111 mmol/L 89  90  90   CO2 22 - 32 mmol/L 33  34  34   Calcium 8.9 - 10.3 mg/dL 78.2  95.6  21.3    I have reviewed the labs.   Pertinent Imaging N/A  Assessment & Plan:    1. Testosterone deficiency  -testosterone level subtherapeutic today, but patient feels clinically well -Hemoglobin/hematocrit normal -Continue AndroGel 1.62%, 2 pumps/1 pump rotation daily making sure it is applied daily  2. BPH with LUTS  - PSA normal - symptoms - frequency -attributed  to the diuretics for his congestive heart failure -continue conservative management, avoiding bladder irritants and timed voiding's  3. Hernias -Referral placed to general surgery for further evaluation -Advised  him to wear a hernia belt in the interim for support and to not engage in lifting greater than 25 pounds -Reviewed red flag signs (extreme pain associated with a nonreducible hernia, changes in stool habits, fever/and or nausea vomiting) and instructed him to seek treatment immediately in the emergency department as this can be any signs of a incarcerated hernia/strangulated hernia and ileus life-threatening  4. Non-healing wound -Offered referral to dermatology, but he would like to wait till after his cardiac appointments and his appointments with general surgery -he will contact the office after he has seen the cardiologist and general surgeon   Return in about 6 months (around 07/23/2023) for PSA, testosterone, H & H, IPSS, SHIM and exam.  Michiel Cowboy, PA-C  [facility] [address] [place], [place] [address] [phone]

## 2023-01-18 ENCOUNTER — Telehealth: Payer: Self-pay | Admitting: Urology

## 2023-01-18 ENCOUNTER — Other Ambulatory Visit
Admission: RE | Admit: 2023-01-18 | Discharge: 2023-01-18 | Disposition: A | Discharge status: Home / Self Care | Payer: Medicare HMO | Attending: Urology | Admitting: Urology

## 2023-01-18 ENCOUNTER — Other Ambulatory Visit: Payer: Self-pay | Admitting: Family Medicine

## 2023-01-18 DIAGNOSIS — E349 Endocrine disorder, unspecified: Secondary | ICD-10-CM

## 2023-01-18 DIAGNOSIS — N401 Enlarged prostate with lower urinary tract symptoms: Secondary | ICD-10-CM | POA: Diagnosis present

## 2023-01-18 DIAGNOSIS — N138 Other obstructive and reflux uropathy: Secondary | ICD-10-CM | POA: Insufficient documentation

## 2023-01-18 LAB — PSA: Prostatic Specific Antigen: 0.38 ng/mL (ref 0.00–4.00)

## 2023-01-18 LAB — HEMOGLOBIN AND HEMATOCRIT, BLOOD
HCT: 30.4 % — ABNORMAL LOW (ref 39.0–52.0)
Hemoglobin: 10 g/dL — ABNORMAL LOW (ref 13.0–17.0)

## 2023-01-18 NOTE — Telephone Encounter (Signed)
Patient has an appointment scheduled with Carollee Herter on Monday 01/21/23 in [place]. He called regarding lab orders for [place] lab. Can those be put in so he can go today?

## 2023-01-18 NOTE — Telephone Encounter (Signed)
Orders have been placed, patient notified.

## 2023-01-20 LAB — TESTOSTERONE: Testosterone: 320 ng/dL (ref 264–916)

## 2023-01-21 ENCOUNTER — Ambulatory Visit: Payer: Medicare HMO | Admitting: Urology

## 2023-01-21 ENCOUNTER — Encounter: Payer: Self-pay | Admitting: Urology

## 2023-01-21 ENCOUNTER — Other Ambulatory Visit
Admission: RE | Admit: 2023-01-21 | Discharge: 2023-01-21 | Disposition: A | Discharge status: Home / Self Care | Payer: Medicare HMO | Attending: Urology | Admitting: Urology

## 2023-01-21 ENCOUNTER — Other Ambulatory Visit: Payer: Self-pay

## 2023-01-21 VITALS — BP 133/65 | HR 63 | Ht 73.0 in | Wt 184.2 lb

## 2023-01-21 DIAGNOSIS — N138 Other obstructive and reflux uropathy: Secondary | ICD-10-CM

## 2023-01-21 DIAGNOSIS — N529 Male erectile dysfunction, unspecified: Secondary | ICD-10-CM

## 2023-01-21 DIAGNOSIS — E349 Endocrine disorder, unspecified: Secondary | ICD-10-CM | POA: Diagnosis present

## 2023-01-21 DIAGNOSIS — K4091 Unilateral inguinal hernia, without obstruction or gangrene, recurrent: Secondary | ICD-10-CM | POA: Diagnosis not present

## 2023-01-21 DIAGNOSIS — N401 Enlarged prostate with lower urinary tract symptoms: Secondary | ICD-10-CM

## 2023-01-21 DIAGNOSIS — S0991XA Unspecified injury of ear, initial encounter: Secondary | ICD-10-CM

## 2023-01-21 DIAGNOSIS — T148XXA Other injury of unspecified body region, initial encounter: Secondary | ICD-10-CM

## 2023-01-21 LAB — PSA: Prostatic Specific Antigen: 0.33 ng/mL (ref 0.00–4.00)

## 2023-01-23 LAB — TESTOSTERONE: Testosterone: 214 ng/dL — ABNORMAL LOW (ref 264–916)

## 2023-01-28 ENCOUNTER — Ambulatory Visit: Payer: Medicare HMO | Admitting: Surgery

## 2023-01-28 ENCOUNTER — Encounter: Payer: Self-pay | Admitting: Surgery

## 2023-01-28 ENCOUNTER — Telehealth: Payer: Self-pay | Admitting: Surgery

## 2023-01-28 VITALS — BP 117/64 | HR 54 | Temp 98.0°F | Ht 73.0 in | Wt 179.0 lb

## 2023-01-28 DIAGNOSIS — M6208 Separation of muscle (nontraumatic), other site: Secondary | ICD-10-CM

## 2023-01-28 DIAGNOSIS — K429 Umbilical hernia without obstruction or gangrene: Secondary | ICD-10-CM | POA: Diagnosis not present

## 2023-01-28 DIAGNOSIS — K4090 Unilateral inguinal hernia, without obstruction or gangrene, not specified as recurrent: Secondary | ICD-10-CM | POA: Diagnosis not present

## 2023-01-28 NOTE — Telephone Encounter (Signed)
Patient has been advised of Pre-Admission date/time, and Surgery date at [facility].  Surgery Date: 02/19/23 Preadmission Testing Date: 02/11/23 (phone 1p-4p)  Patient has been made aware to call [phone], between 1-3:00pm the day before surgery, to find out what time to arrive for surgery.

## 2023-01-28 NOTE — Progress Notes (Signed)
Request for Cardiology clearance and request to stop Plavix and Eliquis has been faxed to Dr Juliann Pares at [facility].

## 2023-01-28 NOTE — Progress Notes (Signed)
01/28/2023  Reason for Visit: Right inguinal hernia, umbilical hernia  Requesting Provider: Michiel Cowboy, PA-C  History of Present Illness: [name] is a 77 y.o. male presenting for evaluation of a right inguinal hernia and umbilical hernia.  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  On his last visit with her on 01/21/2023, the patient mention also that he was having a large bump in his right groin.  On her exam, he was found to have a right inguinal hernia as well as an umbilical hernia and was referred to [place] for further evaluation.  The patient reports that he has noticed a bump in his right groin for a few weeks now.  Reports the wound is bulging more sometimes he can be more tender particular when he is walking.  He usually is able to sit or lie down and push the hernia back in without issues.  The pain does not radiate and is more localized to the right groin.  He denies any symptoms in the left groin.  Denies any particular pain at the umbilicus but he is also noticed bulging in that area.  Of note, the patient has a significant cardiac history with a history of CABG in 2018 as well as stent placement in 2021 with Dr. Juliann Pares.  He was recently admitted 12/20/2022 with worsening shortness of breath and CHF.  He also has history of atrial fibrillation and Eliquis was started on his last admission.  He is also on Plavix.  Since his last admission, he has had a significant amount of weight loss from diuresis and he feels much better from the breathing standpoint.  Past Medical History: Past Medical History:  Diagnosis Date   Anemia    Arthritis    Atrial fibrillation (HCC)    AV block, 1st degree    Bundle branch block    Chronic kidney disease    patient not aware of this dx   Coronary artery disease    Diabetes mellitus without complication (HCC)    type 2   Dyspnea    GERD (gastroesophageal reflux disease)    History of blood transfusion     Hypertension    Sleep apnea    does not use cpap     Past Surgical History: Past Surgical History:  Procedure Laterality Date   APPENDECTOMY     CARDIAC CATHETERIZATION Left 08/16/2016   Procedure: Left Heart Cath and Coronary Angiography;  Surgeon: Alwyn Pea, MD;  Location: [facility];  Service: Cardiovascular;  Laterality: Left;   COLON SURGERY     part of colon removed   COLONOSCOPY N/A 09/07/2020   Procedure: COLONOSCOPY;  Surgeon: Pasty Spillers, MD;  Location: [facility] ENDOSCOPY;  Service: Endoscopy;  Laterality: N/A;   COLONOSCOPY WITH PROPOFOL N/A 05/17/2021   Procedure: COLONOSCOPY WITH PROPOFOL;  Surgeon: Pasty Spillers, MD;  Location: [facility] ENDOSCOPY;  Service: Endoscopy;  Laterality: N/A;   CORONARY ARTERY BYPASS GRAFT  08/30/2016   triple   CORONARY STENT INTERVENTION N/A 06/09/2020   Procedure: CORONARY STENT INTERVENTION;  Surgeon: Alwyn Pea, MD;  Location: [facility];  Service: Cardiovascular;  Laterality: N/A;   ENDOSCOPIC MUCOSAL RESECTION N/A 09/22/2020   Procedure: ENDOSCOPIC MUCOSAL RESECTION;  Surgeon: Meridee Score Netty Starring., MD;  Location: [facility] ENDOSCOPY;  Service: Gastroenterology;  Laterality: N/A;   ESOPHAGOGASTRODUODENOSCOPY N/A 09/07/2020   Procedure: ESOPHAGOGASTRODUODENOSCOPY (EGD);  Surgeon: Pasty Spillers, MD;  Location: [facility] ENDOSCOPY;  Service:  Endoscopy;  Laterality: N/A;   ESOPHAGOGASTRODUODENOSCOPY (EGD) WITH PROPOFOL N/A 09/22/2020   Procedure: ESOPHAGOGASTRODUODENOSCOPY (EGD) WITH PROPOFOL;  Surgeon: Meridee Score Netty Starring., MD;  Location: [facility] ENDOSCOPY;  Service: Gastroenterology;  Laterality: N/A;   FRACTURE SURGERY     HEMOSTASIS CLIP PLACEMENT  09/22/2020   Procedure: HEMOSTASIS CLIP PLACEMENT;  Surgeon: Lemar Lofty., MD;  Location: [facility] ENDOSCOPY;  Service: Gastroenterology;;   HEMOSTASIS CONTROL  09/22/2020   Procedure: HEMOSTASIS CONTROL;  Surgeon: Lemar Lofty., MD;   Location: [facility] ENDOSCOPY;  Service: Gastroenterology;;   LEFT HEART CATH AND CORONARY ANGIOGRAPHY Left 06/09/2020   Procedure: LEFT HEART CATH AND CORONARY ANGIOGRAPHY;  Surgeon: Alwyn Pea, MD;  Location: [facility];  Service: Cardiovascular;  Laterality: Left;   LEFT HEART CATH AND CORS/GRAFTS ANGIOGRAPHY Left 11/20/2016   Procedure: Left Heart Cath and Cors/Grafts Angiography;  Surgeon: Alwyn Pea, MD;  Location: [facility];  Service: Cardiovascular;  Laterality: Left;   POLYPECTOMY  09/22/2020   Procedure: POLYPECTOMY;  Surgeon: Mansouraty, Netty Starring., MD;  Location: [facility] ENDOSCOPY;  Service: Gastroenterology;;   SUBMUCOSAL LIFTING INJECTION  09/22/2020   Procedure: SUBMUCOSAL LIFTING INJECTION;  Surgeon: Lemar Lofty., MD;  Location: [facility] ENDOSCOPY;  Service: Gastroenterology;;    Home Medications: Prior to Admission medications   Medication Sig Start Date End Date Taking? Authorizing Provider  acetaminophen (TYLENOL) 325 MG tablet Take 2 tablets (650 mg total) by mouth every 6 (six) hours as needed for mild pain (or Fever >/= 101). 09/08/20  Yes Lonia Blood, MD  apixaban (ELIQUIS) 5 MG TABS tablet Take 1 tablet (5 mg total) by mouth 2 (two) times daily. 12/27/22  Yes Marrion Coy, MD  cholecalciferol (CHOLECALCIFEROL) 25 MCG tablet Take 1 tablet (1,000 Units total) by mouth daily. 12/28/22  Yes Marrion Coy, MD  clopidogrel (PLAVIX) 75 MG tablet Take 75 mg by mouth daily.   Yes [provider]  dexlansoprazole (DEXILANT) 60 MG capsule Take 1 capsule by mouth daily. 12/25/21  Yes [provider]  hydrALAZINE (APRESOLINE) 50 MG tablet Take 1 tablet by mouth 3 (three) times daily. 05/10/21  Yes [provider]  insulin glargine (LANTUS) 100 UNIT/ML injection Inject 25 Units into the skin at bedtime.    Yes [provider]  labetalol (NORMODYNE) 100 MG tablet Take 1 tablet by mouth 2 (two) times daily. 06/27/22  Yes  [provider]  metFORMIN (GLUCOPHAGE-XR) 500 MG 24 hr tablet Take 500 mg by mouth 2 (two) times daily.   Yes [provider]  rosuvastatin (CRESTOR) 40 MG tablet Take 40 mg by mouth daily. 06/22/20  Yes [provider]  sitaGLIPtin (JANUVIA) 100 MG tablet Take 100 mg by mouth daily.   Yes [provider]  spironolactone (ALDACTONE) 25 MG tablet Take 1 tablet (25 mg total) by mouth daily. 12/30/22  Yes Marrion Coy, MD  Testosterone 1.62 % GEL Apply 2 Pump topically daily. One pump on each arm 07/23/22  Yes McGowan, Carollee Herter A, PA-C  torsemide (DEMADEX) 20 MG tablet Take 1 tablet (20 mg total) by mouth 2 (two) times daily. 12/30/22 12/30/23 Yes Marrion Coy, MD    Allergies: Allergies  Allergen Reactions   Isosorbide Cough    Social History:  reports that he has quit smoking. His smoking use included cigarettes. He has never been exposed to tobacco smoke. He has never used smokeless tobacco. He reports that he does not drink alcohol and does not use drugs.   Family  History: History reviewed. No pertinent family history.  Review of Systems: Review of Systems  Constitutional:  Negative for chills and fever.  HENT:  Negative for hearing loss.   Respiratory:  Negative for shortness of breath.   Cardiovascular:  Negative for chest pain.  Gastrointestinal:  Positive for abdominal pain. Negative for nausea and vomiting.  Genitourinary:  Negative for dysuria.  Musculoskeletal:  Negative for myalgias.  Skin:  Negative for rash.  Neurological:  Negative for dizziness.  Psychiatric/Behavioral:  Negative for depression.     Physical Exam BP 117/64   Pulse (!) 54   Temp 98 F (36.7 C)   Ht 6\' 1"  (1.854 m)   Wt 179 lb (81.2 kg)   SpO2 99%   BMI 23.62 kg/m  CONSTITUTIONAL: No acute distress, well-nourished HEENT:  Normocephalic, atraumatic, extraocular motion intact. NECK: Trachea is midline, and there is no jugular venous distension.  RESPIRATORY:   Lungs are clear, and breath sounds are equal bilaterally. Normal respiratory effort without pathologic use of accessory muscles. CARDIOVASCULAR: Regular rhythm and rate, systolic murmur on auscultation. GI: The abdomen is soft, nondistended, nontender to palpation.  The patient has diastases recti of the upper abdomen without separation of about 2 to 3 cm.  At the umbilicus, the patient does have a 2 cm reducible hernia without tenderness.  In the right groin, the patient does have a reducible right inguinal hernia.  No evidence of hernia on the left groin.  MUSCULOSKELETAL:  Normal muscle strength and tone in all four extremities.  No peripheral edema or cyanosis. SKIN: Skin turgor is normal. There are no pathologic skin lesions.  NEUROLOGIC:  Motor and sensation is grossly normal.  Cranial nerves are grossly intact. PSYCH:  Alert and oriented to person, place and time. Affect is normal.  Laboratory Analysis: Labs from 12/27/2022: Sodium 133, potassium 4, chloride 89, CO2 33, BUN 50, creatinine 1.73.  WBC 8.8, hemoglobin 11.4, hematocrit 35.1, platelets 224.  Imaging: No results found.  Assessment and Plan: This is a 77 y.o. male with a right inguinal hernia and umbilical hernia.  - Discussed with the patient the findings on his exam today.  The patient does have a right inguinal hernia that is reducible as well as an umbilical hernia which is also reducible.  This is in the setting of diastases recti.  Discussed with him the difference between diastases and hernia and that currently he does not need any surgical intervention for the diastases itself.  However given the discomfort in the right groin, would recommend a right inguinal hernia repair.  Discussed with him that at the same time we will be able to repair the umbilical hernia.  However these are all contingent on getting clearance from cardiology team particularly given his cardiac history and that he is on Eliquis and Plavix which will  need to be held for his surgery.  Will send clearance for Dr. Juliann Pares. - Tentatively, we could try scheduling the patient for surgery on 02/19/2023.  Reviewed with him the plan for robotic assisted right inguinal hernia repair, possible bilateral, as well as an open umbilical hernia repair.  Discussed with him the surgery at length including the incisions, risks of bleeding, infection, injury to surrounding structures, activity restrictions, pain control, that this would be an outpatient surgery, the use of mesh, and he is willing to proceed.  He is also aware that the date for surgery may need to be changed based on cardiology recommendations and if any other further  testing is needed particularly given his recent admission. - Patient understands this plan and all of his questions have been answered.  I spent 55 minutes dedicated to the care of this patient on the date of this encounter to include pre-visit review of records, face-to-face time with the patient discussing diagnosis and management, and any post-visit coordination of care.   Howie Ill, MD  [facility]

## 2023-01-28 NOTE — Progress Notes (Signed)
AAMARI, BENDANA ([id number]) [id number] Page 1 of 8 Visit Report for 01/03/2023 Chief Complaint Document Details Patient Name: Date of Service: [name], [name]. 01/03/2023 9:30 Jerry M Medical Record Number: [id number] Patient Account Number: [id number] Date of Birth/Sex: Treating RN: 1946/01/23 (77 y.o. Judie Petit) Yevonne Pax Primary Care Provider: Dewaine Oats Other Clinician: Referring Provider: Treating Provider/Extender: Dennie Bible Weeks in Treatment: 0 Information Obtained from: Patient Chief Complaint Right LE ulcer Electronic Signature(s) Signed: 01/03/2023 10:33:13 AM By: Allen Derry PA-C Entered By: Allen Derry on 01/03/2023 10:33:13 -------------------------------------------------------------------------------- HPI Details Patient Name: Date of Service: [name], [name]. 01/03/2023 9:30 Jerry M Medical Record Number: [id number] Patient Account Number: [id number] Date of Birth/Sex: Treating RN: [birth year] (77 y.o. [name] Primary Care Provider: Dewaine Oats Other Clinician: Referring Provider: Treating Provider/Extender: Dennie Bible Weeks in Treatment: 0 History of Present Illness HPI Description: 01-03-2023 upon evaluation today patient appears for initial evaluation in our clinic concerning Jerry wound on the right lateral leg. 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 He is on long-term anticoagulant therapy has atrial fibrillation and subsequently is also diabetic currently on insulin for control. He was recently in the hospital where over 7 days they were able to get 30 pounds of fluid off of him which has made Jerry significant difference with his legs but he does have issues with  swelling and venous stasis in his legs which has led to wounds. They had wrapped and he said that wrap on for Jerry bit over Jerry week before coming in today. With that being said the wound that we see currently actually there is just 1 on the right leg and that is mostly healed as well he actually seems to be doing quite well. Electronic Signature(s) Signed: 01/03/2023 11:46:18 AM By: Allen Derry PA-C Entered By: Allen Derry on 01/03/2023 11:46:17 [name], [name] ([id number]) [id number] Page 2 of 8 -------------------------------------------------------------------------------- Physical Exam Details Patient Name: Date of Service: [name], [name]. 01/03/2023 9:30 Jerry M Medical Record Number: [id number] Patient Account Number: [id number] Date of Birth/Sex: Treating RN: 04-02-1946 (77 y.o. [name] Primary Care Provider: Dewaine Oats Other Clinician: Referring Provider: Treating Provider/Extender: Dennie Bible Weeks in Treatment: 0 Constitutional sitting or standing blood pressure is within target range for patient.. pulse regular and within target range for patient.Marland Kitchen respirations regular, non-labored and within target range for patient.Marland Kitchen temperature within target range for patient.. Well-nourished and well-hydrated in no acute distress. Eyes conjunctiva clear no eyelid edema noted. pupils equal round and reactive to light and accommodation. Ears, Nose, Mouth, and Throat no gross abnormality of ear auricles or external auditory canals. normal hearing noted during conversation. mucus membranes moist. Respiratory normal breathing without difficulty. Cardiovascular 2+ dorsalis pedis/posterior tibialis pulses. no clubbing, cyanosis, significant edema, <3 sec cap refill. Musculoskeletal normal gait and posture. no significant deformity or arthritic changes, no loss or range of motion, no clubbing. Psychiatric this patient is able to make decisions and  demonstrates good insight into disease process. Alert and Oriented x 3. pleasant and cooperative. Notes Upon inspection patient's wound on the leg actually is showing signs of doing decently well. I do not see any signs of active infection locally  nor systemically which is great news. The wound is very small and in fact mostly skin covered at this point based on what I am seeing. Fortunately I do not see any evidence of active infection at this time. Electronic Signature(s) Signed: 01/03/2023 11:48:00 AM By: Allen Derry PA-C Entered By: Allen Derry on 01/03/2023 11:48:00 -------------------------------------------------------------------------------- Physician Orders Details Patient Name: Date of Service: [name], [name]. 01/03/2023 9:30 Jerry M Medical Record Number: [id number] Patient Account Number: [id number] Date of Birth/Sex: Treating RN: Mar 12, 1946 (77 y.o. [name] Primary Care Provider: Dewaine Oats Other Clinician: Referring Provider: Treating Provider/Extender: Dennie Bible Weeks in Treatment: 0 Verbal / Phone Orders: No [name], [name] ([id number]) [id number] Page 3 of 8 Diagnosis Coding ICD-10 Coding Code Description [phone] Chronic venous hypertension (idiopathic) with ulcer and inflammation of bilateral lower extremity L97.812 Non-pressure chronic ulcer of other part of right lower leg with fat layer exposed E11.622 Type 2 diabetes mellitus with other skin ulcer Z79.4 Long term (current) use of insulin I48.0 Paroxysmal atrial fibrillation Z79.01 Long term (current) use of anticoagulants I45.5 Other specified heart block I25.10 Atherosclerotic heart disease of native coronary artery without angina pectoris N18.9 Chronic kidney disease, unspecified I10 Essential (primary) hypertension Follow-up Appointments Return Appointment in 1 week. Bathing/ Shower/ Hygiene May shower; gently cleanse wound with antibacterial soap, rinse  and pat dry prior to dressing wounds Edema Control - Lymphedema / Segmental Compressive Device / Other Tubigrip single layer applied. - apply TCA to left leg then apply single layer size D tubi grip Elevate, Exercise Daily and Jerry void Standing for Long Periods of Time. Elevate legs to the level of the heart and pump ankles as often as possible Elevate leg(s) parallel to the floor when sitting. Wound Treatment Wound #1 - Lower Leg Wound Laterality: Right, Lateral Topical: Triamcinolone Acetonide Cream, 0.1%, 15 (g) tube 1 x Per Day/30 Days Discharge Instructions: Apply as directed by provider. Secured With: Tubigrip Size D, 3x10 (in/yd) 1 x Per Day/30 Days Patient Medications llergies: isosorbide Jerry Notifications Medication Indication Start End 01/03/2023 triamcinolone acetonide DOSE topical 0.1 % ointment - ointment topical once daily to both legs then cover with tubigrip as directed in clinic x 30 days Electronic Signature(s) Signed: 01/03/2023 11:56:04 AM By: Allen Derry PA-C Previous Signature: 01/03/2023 11:51:47 AM Version By: Yevonne Pax RN Entered By: Allen Derry on 01/03/2023 11:56:04 -------------------------------------------------------------------------------- Problem List Details Patient Name: Date of Service: [name], [name]. 01/03/2023 9:30 Jerry M Medical Record Number: [id number] Patient Account Number: [id number] Date of Birth/Sex: Treating RN: [birth year] (77 y.o. [name] Primary Care Provider: Dewaine Oats Other Clinician: Referring Provider: Treating Provider/Extender: Dennie Bible Weeks in Treatment: 0 [name], [name] ([id number]) [id number] Page 4 of 8 Active Problems ICD-10 Encounter Code Description Active Date MDM Diagnosis I87.333 Chronic venous hypertension (idiopathic) with ulcer and inflammation of 01/03/2023 No Yes bilateral lower extremity L97.812 Non-pressure chronic ulcer of other part of right lower  leg with fat layer 01/03/2023 No Yes exposed E11.622 Type 2 diabetes mellitus with other skin ulcer 01/03/2023 No Yes Z79.4 Long term (current) use of insulin 01/03/2023 No Yes I48.0 Paroxysmal atrial fibrillation 01/03/2023 No Yes Z79.01 Long term (current) use of anticoagulants 01/03/2023 No Yes I45.5 Other specified heart block 01/03/2023 No Yes I25.10 Atherosclerotic heart disease of native coronary artery without angina pectoris 01/03/2023 No Yes N18.9 Chronic kidney disease, unspecified 01/03/2023 No Yes I10 Essential (primary) hypertension 01/03/2023 No Yes Inactive Problems Resolved  Problems Electronic Signature(s) Signed: 01/03/2023 10:31:46 AM By: Allen Derry PA-C Entered By: Allen Derry on 01/03/2023 10:31:46 -------------------------------------------------------------------------------- Progress Note Details Patient Name: Date of Service: [name], [name]. 01/03/2023 9:30 Jerry M Medical Record Number: [id number] Patient Account Number: [id number] Date of Birth/Sex: Treating RN: [birth year] ([address] y.o. Judie Petit) Devone, Batt Fitzgerald ([id number]) [phone].pdf Page 5 of 8 Primary Care Provider: Dewaine Oats Other Clinician: Referring Provider: Treating Provider/Extender: Dennie Bible Weeks in Treatment: 0 Subjective Chief Complaint Information obtained from Patient Right LE ulcer History of Present Illness (HPI) 01-03-2023 upon evaluation today patient appears for initial evaluation in our clinic concerning Jerry wound on the right lateral leg. 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 He is on long-term anticoagulant therapy has atrial fibrillation and subsequently is also diabetic currently on insulin for  control. He was recently in the hospital where over 7 days they were able to get 30 pounds of fluid off of him which has made Jerry significant difference with his legs but he does have issues with swelling and venous stasis in his legs which has led to wounds. They had wrapped and he said that wrap on for Jerry bit over Jerry week before coming in today. With that being said the wound that we see currently actually there is just 1 on the right leg and that is mostly healed as well he actually seems to be doing quite well. Patient History Allergies isosorbide Social History Former smoker, Marital Status - Married, Alcohol Use - Never, Drug Use - No History, Caffeine Use - Daily. Medical History Cardiovascular Patient has history of Coronary Artery Disease, Hypertension Endocrine Patient has history of Type II Diabetes Patient is treated with Insulin, Oral Agents. Objective Constitutional sitting or standing blood pressure is within target range for patient.. pulse regular and within target range for patient.Marland Kitchen respirations regular, non-labored and within target range for patient.Marland Kitchen temperature within target range for patient.. Well-nourished and well-hydrated in no acute distress. Vitals Time Taken: 9:49 AM, Height: 73 in, Source: Stated, Weight: 202 lbs, Source: Stated, BMI: 26.6, Temperature: 98.4 F, Pulse: 63 bpm, Respiratory Rate: 18 breaths/min, Blood Pressure: 139/68 mmHg. Eyes conjunctiva clear no eyelid edema noted. pupils equal round and reactive to light and accommodation. Ears, Nose, Mouth, and Throat no gross abnormality of ear auricles or external auditory canals. normal hearing noted during conversation. mucus membranes moist. Respiratory normal breathing without difficulty. Cardiovascular 2+ dorsalis pedis/posterior tibialis pulses. no clubbing, cyanosis, significant edema, Musculoskeletal normal gait and posture. no significant deformity or arthritic changes, no loss or range of  motion, no clubbing. Psychiatric this patient is able to make decisions and demonstrates good insight into disease process. Alert and Oriented x 3. pleasant and cooperative. General Notes: Upon inspection patient's wound on the leg actually is showing signs of doing decently well. I do not see any signs of active infection locally nor systemically which is great news. The wound is very small and in fact mostly skin covered at this point based on what I am seeing. Fortunately I do not see any evidence of active infection at this time. Integumentary (Hair, Skin) Wound #1 status is Open. Original cause of wound was Gradually Appeared. The date acquired was: 12/12/2022. The wound is  located on the Right,Lateral Lower Leg. The wound measures 1.6cm length x 1cm width x 0.1cm depth; 1.257cm^2 area and 0.126cm^3 volume. There is Fat Layer (Subcutaneous Tissue) exposed. There is no tunneling or undermining noted. There is Jerry medium amount of serosanguineous drainage noted. There is large (67-100%) red granulation within the wound bed. There is Jerry small (1-33%) amount of necrotic tissue within the wound bed including Adherent Slough. 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 Bathing/ Shower/ Hygiene: May shower; gently cleanse wound with antibacterial soap, rinse and  pat dry prior to dressing wounds Edema Control - Lymphedema / Segmental Compressive Device / Other: Tubigrip single layer applied. - apply TCA to left leg then apply single layer size D tubi grip Elevate, Exercise Daily and Avoid Standing for Long Periods of Time. Elevate legs to the level of the heart and pump ankles as often as possible Elevate leg(s) parallel to the floor when sitting. The following medication(s) was prescribed: triamcinolone acetonide topical 0.1 % ointment ointment topical once daily to both legs then cover with tubigrip as directed in clinic x 30 days starting 01/03/2023 WOUND #1: - Lower Leg Wound Laterality: Right, Lateral Topical: Triamcinolone Acetonide Cream, 0.1%, 15 (g) tube 1 x Per Day/30 Days Discharge Instructions: Apply as directed by provider. Secured With: Tubigrip Size D, 3x10 (in/yd) 1 x Per Day/30 Days 1. Based on what I am seeing I do believe that the patient should continue to monitor for any evidence of infection or worsening. I do believe that we are moving in the right direction which is great news and in general I think that we are actually in Jerry good spot to get this completely healed. I think that he would probably do well with using some triamcinolone topically and then subsequently using Tubigrip in place of his compression socks for now although I do think having compression socks will be the way to go long-term. I think this is good to be preferable to wrapping at this point. That way he will be able to wash and bathe daily and be able to get some of the dry skin loosen away as well. 2. Other than the triamcinolone that I am sending in for him I am also going to suggest that the patient should continue with the Tubigrip size D single-layer daily he will put this on after his shower after applying the triamcinolone. 3. In the shower I did recommend that he needs to try to get as much of the dead skin off but do not pick or pluck out it just wash with  his hand and let what comes off, off. He understands. We will see patient back for reevaluation in 1 week here in the clinic. If anything worsens or changes patient will contact our office for additional recommendations. Electronic Signature(s) Signed: 01/03/2023 11:56:56 AM By: Allen Derry PA-C Previous Signature: 01/03/2023 11:48:52 AM Version By: Allen Derry PA-C Entered By: Allen Derry on 01/03/2023 11:56:56 -------------------------------------------------------------------------------- ROS/PFSH Details Patient Name: Date of Service: [name],  [name]. 01/03/2023 9:30 Jerry M Medical Record Number: [id number] Patient Account Number: [id number] Date of Birth/Sex: Treating RN: June 13, 1946 (77 y.o. Judie Petit) Yevonne Pax Primary Care Provider: Dewaine Oats Other Clinician: Referring Provider: Treating Provider/Extender: Dennie Bible Weeks in Treatment: 0 [name], [name] ([id number]) [id number] Page 7 of 8 Cardiovascular Medical History: Positive for: Coronary Artery Disease; Hypertension Endocrine Medical History: Positive for: Type II Diabetes Time with diabetes: 20 Treated with: Insulin, Oral agents Immunizations Pneumococcal Vaccine: Received Pneumococcal Vaccination: No Implantable Devices None Family and Social History Former smoker; Marital Status - Married; Alcohol Use: Never; Drug Use: No History; Caffeine Use: Daily Electronic Signature(s) Signed: 01/03/2023 3:57:39 PM By: Yevonne Pax RN Signed: 01/03/2023 4:01:19 PM By: Allen Derry PA-C Entered By: Yevonne Pax on 01/03/2023 09:54:35 -------------------------------------------------------------------------------- SuperBill Details Patient Name: Date of Service: [name], [name]. 01/03/2023 Medical Record Number: [id number] Patient Account Number: [id number] Date of Birth/Sex: Treating RN: 1946-06-10 (77 y.o. Judie Petit) Yevonne Pax Primary Care Provider: Dewaine Oats Other  Clinician: Referring Provider: Treating Provider/Extender: Dennie Bible Weeks in Treatment: 0 Diagnosis Coding ICD-10 Codes Code Description [phone] Chronic venous hypertension (idiopathic) with ulcer and inflammation of bilateral lower extremity L97.812 Non-pressure chronic ulcer of other part of right lower leg with fat layer exposed E11.622 Type 2 diabetes mellitus with other skin ulcer Z79.4 Long term (current) use of insulin I48.0 Paroxysmal atrial fibrillation Z79.01 Long term (current) use of anticoagulants I45.5 Other specified heart block I25.10 Atherosclerotic heart disease of native coronary artery without angina pectoris N18.9 Chronic kidney disease, unspecified I10 Essential (primary) hypertension Facility Procedures Physician Procedures : CPT4 Code Description Modifier [id number] WC PHYS LEVEL 3 NEW PT ICD-10 Diagnosis Description I87.333 Chronic venous hypertension (idiopathic) with ulcer and inflammation of bilateral lower extremity L97.812 Non-pressure chronic ulcer of other part of  right lower leg with fat layer exposed E11.622 Type 2 diabetes mellitus with other skin ulcer Z79.4 Long term (current) use of insulin Quantity: 1 Electronic Signature(s) Signed: 01/03/2023 11:52:49 AM By: Yevonne Pax RN Signed: 01/03/2023 4:01:19 PM By: Allen Derry PA-C Previous Signature: 01/03/2023 11:49:07 AM Version By: Allen Derry PA-C Entered By: Yevonne Pax on 01/03/2023 11:52:49

## 2023-01-28 NOTE — H&P (View-Only) (Signed)
01/28/2023  Reason for Visit: Right inguinal hernia, umbilical hernia  Requesting Provider: Shannon McGowan, PA-C  History of Present Illness: [name] is a 77 y.o. male presenting for evaluation of a right inguinal hernia and umbilical hernia.  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  On his last visit with her on 01/21/2023, the patient mention also that he was having a large bump in his right groin.  On her exam, he was found to have a right inguinal hernia as well as an umbilical hernia and was referred to us for further evaluation.  The patient reports that he has noticed a bump in his right groin for a few weeks now.  Reports the wound is bulging more sometimes he can be more tender particular when he is walking.  He usually is able to sit or lie down and push the hernia back in without issues.  The pain does not radiate and is more localized to the right groin.  He denies any symptoms in the left groin.  Denies any particular pain at the umbilicus but he is also noticed bulging in that area.  Of note, the patient has a significant cardiac history with a history of CABG in 2018 as well as stent placement in 2021 with Dr. Callwood.  He was recently admitted 12/20/2022 with worsening shortness of breath and CHF.  He also has history of atrial fibrillation and Eliquis was started on his last admission.  He is also on Plavix.  Since his last admission, he has had a significant amount of weight loss from diuresis and he feels much better from the breathing standpoint.  Past Medical History: Past Medical History:  Diagnosis Date   Anemia    Arthritis    Atrial fibrillation (HCC)    AV block, 1st degree    Bundle branch block    Chronic kidney disease    patient not aware of this dx   Coronary artery disease    Diabetes mellitus without complication (HCC)    type 2   Dyspnea    GERD (gastroesophageal reflux disease)    History of blood transfusion     Hypertension    Sleep apnea    does not use cpap     Past Surgical History: Past Surgical History:  Procedure Laterality Date   APPENDECTOMY     CARDIAC CATHETERIZATION Left 08/16/2016   Procedure: Left Heart Cath and Coronary Angiography;  Surgeon: Dwayne D Callwood, MD;  Location: [facility];  Service: Cardiovascular;  Laterality: Left;   COLON SURGERY     part of colon removed   COLONOSCOPY N/A 09/07/2020   Procedure: COLONOSCOPY;  Surgeon: Tahiliani, Varnita B, MD;  Location: [facility];  Service: Endoscopy;  Laterality: N/A;   COLONOSCOPY WITH PROPOFOL N/A 05/17/2021   Procedure: COLONOSCOPY WITH PROPOFOL;  Surgeon: Tahiliani, Varnita B, MD;  Location: [facility];  Service: Endoscopy;  Laterality: N/A;   CORONARY ARTERY BYPASS GRAFT  08/30/2016   triple   CORONARY STENT INTERVENTION N/A 06/09/2020   Procedure: CORONARY STENT INTERVENTION;  Surgeon: Callwood, Dwayne D, MD;  Location: [facility];  Service: Cardiovascular;  Laterality: N/A;   ENDOSCOPIC MUCOSAL RESECTION N/A 09/22/2020   Procedure: ENDOSCOPIC MUCOSAL RESECTION;  Surgeon: Mansouraty, Gabriel Jr., MD;  Location: [facility];  Service: Gastroenterology;  Laterality: N/A;   ESOPHAGOGASTRODUODENOSCOPY N/A 09/07/2020   Procedure: ESOPHAGOGASTRODUODENOSCOPY (EGD);  Surgeon: Tahiliani, Varnita B, MD;  Location: [facility];  Service:   Endoscopy;  Laterality: N/A;   ESOPHAGOGASTRODUODENOSCOPY (EGD) WITH PROPOFOL N/A 09/22/2020   Procedure: ESOPHAGOGASTRODUODENOSCOPY (EGD) WITH PROPOFOL;  Surgeon: Mansouraty, Gabriel Jr., MD;  Location: [facility];  Service: Gastroenterology;  Laterality: N/A;   FRACTURE SURGERY     HEMOSTASIS CLIP PLACEMENT  09/22/2020   Procedure: HEMOSTASIS CLIP PLACEMENT;  Surgeon: Mansouraty, Gabriel Jr., MD;  Location: [facility];  Service: Gastroenterology;;   HEMOSTASIS CONTROL  09/22/2020   Procedure: HEMOSTASIS CONTROL;  Surgeon: Mansouraty, Gabriel Jr., MD;   Location: [facility];  Service: Gastroenterology;;   LEFT HEART CATH AND CORONARY ANGIOGRAPHY Left 06/09/2020   Procedure: LEFT HEART CATH AND CORONARY ANGIOGRAPHY;  Surgeon: Callwood, Dwayne D, MD;  Location: [facility];  Service: Cardiovascular;  Laterality: Left;   LEFT HEART CATH AND CORS/GRAFTS ANGIOGRAPHY Left 11/20/2016   Procedure: Left Heart Cath and Cors/Grafts Angiography;  Surgeon: Dwayne D Callwood, MD;  Location: [facility];  Service: Cardiovascular;  Laterality: Left;   POLYPECTOMY  09/22/2020   Procedure: POLYPECTOMY;  Surgeon: Mansouraty, Gabriel Jr., MD;  Location: [facility];  Service: Gastroenterology;;   SUBMUCOSAL LIFTING INJECTION  09/22/2020   Procedure: SUBMUCOSAL LIFTING INJECTION;  Surgeon: Mansouraty, Gabriel Jr., MD;  Location: [facility];  Service: Gastroenterology;;    Home Medications: Prior to Admission medications   Medication Sig Start Date End Date Taking? Authorizing Provider  acetaminophen (TYLENOL) 325 MG tablet Take 2 tablets (650 mg total) by mouth every 6 (six) hours as needed for mild pain (or Fever >/= 101). 09/08/20  Yes McClung, Jeffrey T, MD  apixaban (ELIQUIS) 5 MG TABS tablet Take 1 tablet (5 mg total) by mouth 2 (two) times daily. 12/27/22  Yes Zhang, Dekui, MD  cholecalciferol (CHOLECALCIFEROL) 25 MCG tablet Take 1 tablet (1,000 Units total) by mouth daily. 12/28/22  Yes Zhang, Dekui, MD  clopidogrel (PLAVIX) 75 MG tablet Take 75 mg by mouth daily.   Yes [provider]  dexlansoprazole (DEXILANT) 60 MG capsule Take 1 capsule by mouth daily. 12/25/21  Yes [provider]  hydrALAZINE (APRESOLINE) 50 MG tablet Take 1 tablet by mouth 3 (three) times daily. 05/10/21  Yes [provider]  insulin glargine (LANTUS) 100 UNIT/ML injection Inject 25 Units into the skin at bedtime.    Yes [provider]  labetalol (NORMODYNE) 100 MG tablet Take 1 tablet by mouth 2 (two) times daily. 06/27/22  Yes  [provider]  metFORMIN (GLUCOPHAGE-XR) 500 MG 24 hr tablet Take 500 mg by mouth 2 (two) times daily.   Yes [provider]  rosuvastatin (CRESTOR) 40 MG tablet Take 40 mg by mouth daily. 06/22/20  Yes [provider]  sitaGLIPtin (JANUVIA) 100 MG tablet Take 100 mg by mouth daily.   Yes [provider]  spironolactone (ALDACTONE) 25 MG tablet Take 1 tablet (25 mg total) by mouth daily. 12/30/22  Yes Zhang, Dekui, MD  Testosterone 1.62 % GEL Apply 2 Pump topically daily. One pump on each arm 07/23/22  Yes McGowan, Shannon A, PA-C  torsemide (DEMADEX) 20 MG tablet Take 1 tablet (20 mg total) by mouth 2 (two) times daily. 12/30/22 12/30/23 Yes Zhang, Dekui, MD    Allergies: Allergies  Allergen Reactions   Isosorbide Cough    Social History:  reports that he has quit smoking. His smoking use included cigarettes. He has never been exposed to tobacco smoke. He has never used smokeless tobacco. He reports that he does not drink alcohol and does not use drugs.   Family   History: History reviewed. No pertinent family history.  Review of Systems: Review of Systems  Constitutional:  Negative for chills and fever.  HENT:  Negative for hearing loss.   Respiratory:  Negative for shortness of breath.   Cardiovascular:  Negative for chest pain.  Gastrointestinal:  Positive for abdominal pain. Negative for nausea and vomiting.  Genitourinary:  Negative for dysuria.  Musculoskeletal:  Negative for myalgias.  Skin:  Negative for rash.  Neurological:  Negative for dizziness.  Psychiatric/Behavioral:  Negative for depression.     Physical Exam BP 117/64   Pulse (!) 54   Temp 98 F (36.7 C)   Ht 6' 1" (1.854 m)   Wt 179 lb (81.2 kg)   SpO2 99%   BMI 23.62 kg/m  CONSTITUTIONAL: No acute distress, well-nourished HEENT:  Normocephalic, atraumatic, extraocular motion intact. NECK: Trachea is midline, and there is no jugular venous distension.  RESPIRATORY:   Lungs are clear, and breath sounds are equal bilaterally. Normal respiratory effort without pathologic use of accessory muscles. CARDIOVASCULAR: Regular rhythm and rate, systolic murmur on auscultation. GI: The abdomen is soft, nondistended, nontender to palpation.  The patient has diastases recti of the upper abdomen without separation of about 2 to 3 cm.  At the umbilicus, the patient does have a 2 cm reducible hernia without tenderness.  In the right groin, the patient does have a reducible right inguinal hernia.  No evidence of hernia on the left groin.  MUSCULOSKELETAL:  Normal muscle strength and tone in all four extremities.  No peripheral edema or cyanosis. SKIN: Skin turgor is normal. There are no pathologic skin lesions.  NEUROLOGIC:  Motor and sensation is grossly normal.  Cranial nerves are grossly intact. PSYCH:  Alert and oriented to person, place and time. Affect is normal.  Laboratory Analysis: Labs from 12/27/2022: Sodium 133, potassium 4, chloride 89, CO2 33, BUN 50, creatinine 1.73.  WBC 8.8, hemoglobin 11.4, hematocrit 35.1, platelets 224.  Imaging: No results found.  Assessment and Plan: This is a 77 y.o. male with a right inguinal hernia and umbilical hernia.  - Discussed with the patient the findings on his exam today.  The patient does have a right inguinal hernia that is reducible as well as an umbilical hernia which is also reducible.  This is in the setting of diastases recti.  Discussed with him the difference between diastases and hernia and that currently he does not need any surgical intervention for the diastases itself.  However given the discomfort in the right groin, would recommend a right inguinal hernia repair.  Discussed with him that at the same time we will be able to repair the umbilical hernia.  However these are all contingent on getting clearance from cardiology team particularly given his cardiac history and that he is on Eliquis and Plavix which will  need to be held for his surgery.  Will send clearance for Dr. Callwood. - Tentatively, we could try scheduling the patient for surgery on 02/19/2023.  Reviewed with him the plan for robotic assisted right inguinal hernia repair, possible bilateral, as well as an open umbilical hernia repair.  Discussed with him the surgery at length including the incisions, risks of bleeding, infection, injury to surrounding structures, activity restrictions, pain control, that this would be an outpatient surgery, the use of mesh, and he is willing to proceed.  He is also aware that the date for surgery may need to be changed based on cardiology recommendations and if any other further   testing is needed particularly given his recent admission. - Patient understands this plan and all of his questions have been answered.  I spent 55 minutes dedicated to the care of this patient on the date of this encounter to include pre-visit review of records, face-to-face time with the patient discussing diagnosis and management, and any post-visit coordination of care.   Debarah Mccumbers Luis Tasheem Elms, MD [facility]    

## 2023-01-28 NOTE — Patient Instructions (Signed)
You will need to be off of your Plavix and Eliquis prior to surgery. We will send a clearance request to Dr Juliann Pares at [facility] for this.    You have chose to have your hernia repaired. This will be done by Dr. Aleen Campi at [facility].  Please see your (blue) Pre-care information that you have been given today. Our surgery scheduler will call you to verify surgery date and to go over information.   You will need to arrange to be out of work for approximately 1-2 weeks and then you may return with a lifting restriction for 4 more weeks. If you have FMLA or Disability paperwork that needs to be filled out, please have your company fax your paperwork to [phone] or you may drop this by either office. This paperwork will be filled out within 3 days after your surgery has been completed.  You may have a bruise in your groin and also swelling and brusing in your testicle area. You may use ice 4-5 times daily for 15-20 minutes each time. Make sure that you place a barrier between you and the ice pack. To decrease the swelling, you may roll up a bath towel and place it vertically in between your thighs with your testicles resting on the towel. You will want to keep this area elevated as much as possible for several days following surgery.    Inguinal Hernia, Adult Muscles help keep everything in the body in its proper place. But if a weak spot in the muscles develops, something can poke through. That is called a hernia. When this happens in the lower part of the belly (abdomen), it is called an inguinal hernia. (It takes its name from a part of the body in this region called the inguinal canal.) A weak spot in the wall of muscles lets some fat or part of the small intestine bulge through. An inguinal hernia can develop at any age. Men get them more often than women. CAUSES  In adults, an inguinal hernia develops over time. It can be triggered by: Suddenly straining the muscles of the lower  abdomen. Lifting heavy objects. Straining to have a bowel movement. Difficult bowel movements (constipation) can lead to this. Constant coughing. This may be caused by smoking or lung disease. Being overweight. Being pregnant. Working at a job that requires long periods of standing or heavy lifting. Having had an inguinal hernia before. One type can be an emergency situation. It is called a strangulated inguinal hernia. It develops if part of the small intestine slips through the weak spot and cannot get back into the abdomen. The blood supply can be cut off. If that happens, part of the intestine may die. This situation requires emergency surgery. SYMPTOMS  Often, a small inguinal hernia has no symptoms. It is found when a healthcare provider does a physical exam. Larger hernias usually have symptoms.  In adults, symptoms may include: A lump in the groin. This is easier to see when the person is standing. It might disappear when lying down. In men, a lump in the scrotum. Pain or burning in the groin. This occurs especially when lifting, straining or coughing. A dull ache or feeling of pressure in the groin. Signs of a strangulated hernia can include: A bulge in the groin that becomes very painful and tender to the touch. A bulge that turns red or purple. Fever, nausea and vomiting. Inability to have a bowel movement or to pass gas. DIAGNOSIS  To decide  if you have an inguinal hernia, a healthcare provider will probably do a physical examination. This will include asking questions about any symptoms you have noticed. The healthcare provider might feel the groin area and ask you to cough. If an inguinal hernia is felt, the healthcare provider may try to slide it back into the abdomen. Usually no other tests are needed. TREATMENT  Treatments can vary. The size of the hernia makes a difference. Options include: Watchful waiting. This is often suggested if the hernia is small and you have had  no symptoms. No medical procedure will be done unless symptoms develop. You will need to watch closely for symptoms. If any occur, contact your healthcare provider right away. Surgery. This is used if the hernia is larger or you have symptoms. Open surgery. This is usually an outpatient procedure (you will not stay overnight in a hospital). An cut (incision) is made through the skin in the groin. The hernia is put back inside the abdomen. The weak area in the muscles is then repaired by herniorrhaphy or hernioplasty. Herniorrhaphy: in this type of surgery, the weak muscles are sewn back together. Hernioplasty: a patch or mesh is used to close the weak area in the abdominal wall. Laparoscopy. In this procedure, a surgeon makes small incisions. A thin tube with a tiny video camera (called a laparoscope) is put into the abdomen. The surgeon repairs the hernia with mesh by looking with the video camera and using two long instruments. HOME CARE INSTRUCTIONS  After surgery to repair an inguinal hernia: You will need to take pain medicine prescribed by your healthcare provider. Follow all directions carefully. You will need to take care of the wound from the incision. Your activity will be restricted for awhile. This will probably include no heavy lifting for several weeks. You also should not do anything too active for a few weeks. When you can return to work will depend on the type of job that you have. During "watchful waiting" periods, you should: Maintain a healthy weight. Eat a diet high in fiber (fruits, vegetables and whole grains). Drink plenty of fluids to avoid constipation. This means drinking enough water and other liquids to keep your urine clear or pale yellow. Do not lift heavy objects. Do not stand for long periods of time. Quit smoking. This should keep you from developing a frequent cough. SEEK MEDICAL CARE IF:  A bulge develops in your groin area. You feel pain, a burning sensation  or pressure in the groin. This might be worse if you are lifting or straining. You develop a fever of more than 100.5 F (38.1 C). SEEK IMMEDIATE MEDICAL CARE IF:  Pain in the groin increases suddenly. A bulge in the groin gets bigger suddenly and does not go down. For men, there is sudden pain in the scrotum. Or, the size of the scrotum increases. A bulge in the groin area becomes red or purple and is painful to touch. You have nausea or vomiting that does not go away. You feel your heart beating much faster than normal. You cannot have a bowel movement or pass gas. You develop a fever of more than 102.0 F (38.9 C).   This information is not intended to replace advice given to you by your health care provider. Make sure you discuss any questions you have with your health care provider.   Document Released: 12/16/2008 Document Revised: 10/22/2011 Document Reviewed: 01/31/2015 Elsevier Interactive Patient Education [employer]

## 2023-01-28 NOTE — Progress Notes (Signed)
[name] ([id number]) [id number] Page 1 of 6 Visit Report for 01/15/2023 Chief Complaint Document Details Patient Name: Date of Service: [name], [name] 01/15/2023 12:30 PM Medical Record Number: [id number] Patient Account Number: [id number] Date of Birth/Sex: Treating RN: 04-28-1946 (77 y.o. [name]) Yevonne Pax Primary Care Provider: Dewaine Oats Other Clinician: Referring Provider: Treating Provider/Extender: Dennie Bible Weeks in Treatment: 1 Information Obtained from: Patient Chief Complaint Right LE ulcer Electronic Signature(s) Signed: 01/15/2023 12:35:35 PM By: Allen Derry PA-C Entered By: Allen Derry on 01/15/2023 12:35:35 -------------------------------------------------------------------------------- HPI Details Patient Name: Date of Service: [name], [name] 01/15/2023 12:30 PM Medical Record Number: [id number] Patient Account Number: [id number] Date of Birth/Sex: Treating RN: 11-20-1945 (77 y.o. [name] Primary Care Provider: Dewaine Oats Other Clinician: Referring Provider: Treating Provider/Extender: Dennie Bible Weeks in Treatment: 1 History of Present Illness HPI Description: 01-03-2023 upon evaluation today patient appears for initial evaluation in our clinic concerning a wound on the right lateral leg. He actually has a quite significant heart history which includes that of having hypertension, chronic kidney disease although I do not know the stage, coronary artery disease, a heart block which I think has led to heart failure the could define heart failure as a definitive diagnosis. He is on long-term anticoagulant therapy has atrial fibrillation and subsequently is also diabetic currently on insulin for control. He was recently in the hospital where over 7 days they were able to get 30 pounds of fluid off of him which has made a significant difference with his legs but he does have issues with swelling  and venous stasis in his legs which has led to wounds. They had wrapped and he said that wrap on for a bit over a week before coming in today. With that being said the wound that we see currently actually there is just 1 on the right leg and that is mostly healed as well he actually seems to be doing quite well. 01-15-2023 upon evaluation today patient appears to be doing excellent in regard to his wound. In fact everything appears to be completely healed which is great news. Fortunately I do not see any signs of active infection at this time. No fevers, chills, nausea, vomiting, or diarrhea. Electronic Signature(s) Signed: 01/15/2023 12:57:31 PM By: Allen Derry PA-C Entered By: Allen Derry on 01/15/2023 12:57:31 Theissen, Janace Aris ([id number]) [id number] Page 2 of 6 -------------------------------------------------------------------------------- Physical Exam Details Patient Name: Date of Service: [name], [name] 01/15/2023 12:30 PM Medical Record Number: [id number] Patient Account Number: [id number] Date of Birth/Sex: Treating RN: 1946/03/03 (77 y.o. [name] Primary Care Provider: Dewaine Oats Other Clinician: Referring Provider: Treating Provider/Extender: Dennie Bible Weeks in Treatment: 1 Constitutional Well-nourished and well-hydrated in no acute distress. Respiratory normal breathing without difficulty. Psychiatric this patient is able to make decisions and demonstrates good insight into disease process. Alert and Oriented x 3. pleasant and cooperative. Notes Upon inspection patient's wounds actually all appear to be healed there is nothing open on his legs and in general I am extremely pleased with what he is doing I think that he is doing excellent. Electronic Signature(s) Signed: 01/15/2023 12:57:44 PM By: Allen Derry PA-C Entered By: Allen Derry on 01/15/2023  12:57:43 -------------------------------------------------------------------------------- Physician Orders Details Patient Name: Date of Service: [name], [name] 01/15/2023 12:30 PM Medical Record Number: [id number] Patient Account Number: [id number] Date of Birth/Sex: Treating RN: 1946-07-14 (78 y.o. 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 Put garments on first thing when you wake up and remove them before bed. Moisturize legs daily after removing compression garments. Elevate, Exercise Daily and A void Standing for Long Periods of Time. DO YOUR BEST to sleep in the bed at night. DO NOT sleep in your recliner. Long hours of sitting in a recliner leads to swelling of the legs and/or potential wounds on your backside. Electronic Signature(s) Signed: 01/15/2023 2:08:58 PM By: Yevonne Pax RN Signed: 01/15/2023 5:37:24 PM By: Allen Derry PA-C Entered By: Yevonne Pax on 01/15/2023  14:08:58 -------------------------------------------------------------------------------- Problem List Details Patient Name: Date of Service: [name], [name] 01/15/2023 12:30 PM Medical Record Number: [id number] Patient Account Number: [id number] Date of Birth/Sex: Treating RN: Nov 29, 1945 (77 y.o. [name] Primary Care Provider: Dewaine Oats Other Clinician: Referring Provider: Treating Provider/Extender: Dennie Bible Weeks in Treatment: 1 Active Problems ICD-10 Encounter Code Description Active Date MDM Diagnosis I87.333 Chronic venous hypertension (idiopathic) with ulcer and inflammation of 01/03/2023 No Yes bilateral lower extremity L97.812 Non-pressure chronic ulcer of other part of right lower leg with fat layer 01/03/2023 No Yes exposed E11.622 Type 2 diabetes mellitus with other skin ulcer 01/03/2023 No Yes Z79.4 Long term (current) use of insulin 01/03/2023 No Yes I48.0 Paroxysmal atrial fibrillation 01/03/2023 No Yes Z79.01 Long term (current) use of anticoagulants 01/03/2023 No Yes I45.5 Other specified heart block 01/03/2023 No Yes Mumpower, Terryn P ([id number]) [phone].pdf Page 4 of 6 I25.10 Atherosclerotic heart disease of native coronary artery without angina pectoris 01/03/2023 No Yes N18.9 Chronic kidney disease, unspecified 01/03/2023 No Yes I10 Essential (primary) hypertension 01/03/2023 No Yes Inactive Problems Resolved Problems Electronic Signature(s) Signed: 01/15/2023 12:35:31 PM By: Allen Derry PA-C Entered By: Allen Derry on 01/15/2023 12:35:31 -------------------------------------------------------------------------------- Progress Note Details Patient Name: Date of Service: [name], [name] 01/15/2023 12:30 PM Medical Record Number: [id number] Patient Account Number: [id number] Date of Birth/Sex: Treating RN: [birth year] (77 y.o. [name] Primary Care Provider: Dewaine Oats Other Clinician: Referring  Provider: Treating Provider/Extender: Dennie Bible Weeks in Treatment: 1 Subjective Chief Complaint Information obtained from Patient Right LE ulcer History of Present Illness (HPI) 01-03-2023 upon evaluation today patient appears for initial evaluation in our clinic concerning a wound on the right lateral leg. He actually has a quite significant heart history which includes that of having hypertension, chronic kidney disease although I do not know the stage, coronary artery disease, a heart block which I think has led to heart failure the could define heart failure as a definitive diagnosis. He is on long-term anticoagulant therapy has atrial fibrillation and subsequently is also diabetic currently on insulin for control. He was recently in the hospital  where over 7 days they were able to get 30 pounds of fluid off of him which has made a significant difference with his legs but he does have issues with swelling and venous stasis in his legs which has led to wounds. They had wrapped and he said that wrap on for a bit over a week before coming in today. With that being said the wound that we see currently actually there is just 1 on the right leg and that is mostly healed as well he actually seems to be doing quite well. 01-15-2023 upon evaluation today patient appears to be doing excellent in regard to his wound. In fact everything appears to be completely healed which is great news. Fortunately I do not see any signs of active infection at this time. No fevers, chills, nausea, vomiting, or diarrhea. Objective Constitutional Well-nourished and well-hydrated in no acute distress. Vitals Time Taken: 12:38 PM, Height: 73 in, Weight: 202 lbs, BMI: 26.6, Temperature: 98.2 F, Pulse: 65 bpm, Respiratory Rate: 18 breaths/min, Blood Long, Rolland P ([id number]) [phone].pdf Page 5 of 6 Pressure: 111/64 mmHg. Respiratory normal breathing without  difficulty. Psychiatric this patient is able to make decisions and demonstrates good insight into disease process. Alert and Oriented x 3. pleasant and cooperative. General Notes: Upon inspection patient's wounds actually all appear to be healed there is nothing open on his legs and in general I am extremely pleased with what he is doing I think that he is doing excellent. Integumentary (Hair, Skin) Wound #1 status is Open. Original cause of wound was Gradually Appeared. The date acquired was: 12/12/2022. The wound has been in treatment 1 weeks. The wound is located on the Right,Lateral Lower Leg. The wound measures 0cm length x 0cm width x 0cm depth; 0cm^2 area and 0cm^3 volume. There is no tunneling or undermining noted. There is a none present amount of drainage noted. There is no granulation within the wound bed. There is no necrotic tissue within the wound bed. 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 I am going to recommend that we have the patient continue to monitor for any evidence of infection or worsening. Based on what I am seeing I do believe that we are making some good progress here which is good news. 2. I am good recommend as well that the patient should continue to monitor for any evidence of infection or worsening overall if anything changes he can contact the office for additional recommendations. Will see him back for follow-up visit as needed. Electronic  Signature(s) Signed: 01/15/2023 12:58:23 PM By: Allen Derry PA-C Entered By: Allen Derry on 01/15/2023 12:58:22 -------------------------------------------------------------------------------- SuperBill Details Patient Name: Date of Service: [name], [name] 01/15/2023 Medical Record Number: [id number] Patient Account Number: [id number] Date of Birth/Sex: Treating RN: 10-30-1945 (77 y.o. 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 Z79.4 Long term (current) use of insulin I48.0 Paroxysmal atrial fibrillation Z79.01 Long term (current) use of anticoagulants I45.5 Other specified heart block I25.10 Atherosclerotic heart disease of native coronary artery without angina pectoris N18.9 Chronic kidney disease, unspecified I10 Essential (primary) hypertension Facility Procedures : CPT4 Code: 16109604 Description: 54098 - WOUND CARE VISIT-LEV 2 EST PT Modifier: Quantity: 1 Physician Procedures : CPT4 Code Description Modifier [id number] - WC PHYS LEVEL 3 - EST PT ICD-10 Diagnosis Description I87.333 Chronic venous hypertension (idiopathic) with ulcer and inflammation of bilateral lower extremity L97.812 Non-pressure chronic ulcer of other  part of right lower leg with fat layer exposed E11.622 Type 2 diabetes mellitus with other skin ulcer Z79.4 Long term (current) use of insulin Quantity: 1 Electronic Signature(s) Signed: 01/15/2023 2:09:44 PM By: Yevonne Pax RN Signed: 01/15/2023 5:37:24 PM By:  Allen Derry PA-C Previous Signature: 01/15/2023 1:01:24 PM Version By: Allen Derry PA-C Entered By: Yevonne Pax on 01/15/2023 14:09:44

## 2023-01-29 ENCOUNTER — Other Ambulatory Visit: Payer: Self-pay | Admitting: Urology

## 2023-01-29 DIAGNOSIS — T148XXA Other injury of unspecified body region, initial encounter: Secondary | ICD-10-CM

## 2023-01-31 ENCOUNTER — Telehealth: Payer: Self-pay

## 2023-01-31 NOTE — Telephone Encounter (Signed)
Spoke with the patient and let him know per Dr Juliann Pares he is to hold his Plavix and Eliquis 5 days prior to surgery. His last dose will be July 3rd.

## 2023-01-31 NOTE — Progress Notes (Unsigned)
Received Cardiology Clearance from Dr Juliann Pares. The patient is cleared at Low risk for surgery. He may hold his Plavix and Eliquis for 5 days prior. I called and spoke with Tresa Endo in [facility] office and did verify the time to hold both the Eliquis and the Plavix.

## 2023-02-11 ENCOUNTER — Encounter: Payer: Self-pay | Admitting: Surgery

## 2023-02-11 ENCOUNTER — Encounter
Admission: RE | Admit: 2023-02-11 | Discharge: 2023-02-11 | Disposition: A | Discharge status: Home / Self Care | Payer: Medicare HMO | Source: Ambulatory Visit | Attending: Surgery | Admitting: Surgery

## 2023-02-11 ENCOUNTER — Other Ambulatory Visit: Payer: Self-pay

## 2023-02-11 VITALS — Ht 73.0 in | Wt 184.6 lb

## 2023-02-11 DIAGNOSIS — E119 Type 2 diabetes mellitus without complications: Secondary | ICD-10-CM

## 2023-02-11 NOTE — Patient Instructions (Addendum)
Your procedure is scheduled on: Tuesday February 19, 2023. Report to the Registration Desk on the 1st floor of the [facility]. To find out your arrival time, please call [phone] between 1PM - 3PM on: Monday February 18, 2023. If your arrival time is 6:00 am, do not arrive before that time as the [facility] entrance doors do not open until 6:00 am.  REMEMBER: Instructions that are not followed completely may result in serious medical risk, up to and including death; or upon the discretion of your surgeon and anesthesiologist your surgery may need to be rescheduled.  Do not eat food after midnight the night before surgery.  No gum chewing or hard candies.  You may however, drink CLEAR liquids up to 2 hours before you are scheduled to arrive for your surgery. Do not drink anything within 2 hours of your scheduled arrival time.  Clear liquids include: - water   One week prior to surgery: Stop Anti-inflammatories (NSAIDS) such as Advil, Aleve, Ibuprofen, Motrin, Naproxen, Naprosyn and Aspirin based products such as Excedrin, Goody's Powder, BC Powder. Stop ANY OVER THE COUNTER supplements until after surgery. You may however, continue to take Tylenol if needed for pain up until the day of surgery.  Continue taking all prescribed medications with the exception of the following: Stop metFORMIN (GLUCOPHAGE-XR) 500 MG 2 days prior to surgery (take last dose Saturday 02/16/23) Stop sitaGLIPtin (JANUVIA) 100 MG 3 days prior to surgery (take last dose Friday 02/15/23) Take 1/2 of your normal insulin glargine (LANTUS) 100 UNIT/ML injection the night before   Follow recommendations from Cardiologist or PCP regarding stopping blood thinners. Stop apixaban (ELIQUIS) 5 MG TABS and clopidogrel (PLAVIX) 75 MG 5 days prior to surgery (take last dose Wednesday 02/13/23)   TAKE ONLY THESE MEDICATIONS THE MORNING OF SURGERY WITH A SIP OF WATER:  hydrALAZINE (APRESOLINE) 50 MG  labetalol (NORMODYNE) 100 MG   rosuvastatin (CRESTOR) 40 MG  dexlansoprazole (DEXILANT) 60 MG    No Alcohol for 24 hours before or after surgery.  No Smoking including e-cigarettes for 24 hours before surgery.  No chewable tobacco products for at least 6 hours before surgery.  No nicotine patches on the day of surgery.  Do not use any "recreational" drugs for at least a week (preferably 2 weeks) before your surgery.  Please be advised that the combination of cocaine and anesthesia may have negative outcomes, up to and including death. If you test positive for cocaine, your surgery will be cancelled.  On the morning of surgery brush your teeth with toothpaste and water, you may rinse your mouth with mouthwash if you wish. Do not swallow any toothpaste or mouthwash.  Use CHG Soap or wipes as directed on instruction sheet.  Do not wear jewelry, make-up, hairpins, clips or nail polish.  Do not wear lotions, powders, or perfumes.   Do not shave body hair from the neck down 48 hours before surgery.  Contact lenses, hearing aids and dentures may not be worn into surgery.  Do not bring valuables to the hospital. [facility] is not responsible for any missing/lost belongings or valuables.   Notify your doctor if there is any change in your medical condition (cold, fever, infection).  Wear comfortable clothing (specific to your surgery type) to the hospital.  After surgery, you can help prevent lung complications by doing breathing exercises.  Take deep breaths and cough every 1-2 hours. Your doctor may order a device called an Incentive Spirometer to help you  take deep breaths. When coughing or sneezing, hold a pillow firmly against your incision with both hands. This is called "splinting." Doing this helps protect your incision. It also decreases belly discomfort.  If you are being admitted to the hospital overnight, leave your suitcase in the car. After surgery it may be brought to your room.  In case of  increased patient census, it may be necessary for you, the patient, to continue your postoperative care in the Same Day Surgery department.  If you are being discharged the day of surgery, you will not be allowed to drive home. You will need a responsible individual to drive you home and stay with you for 24 hours after surgery.   If you are taking public transportation, you will need to have a responsible individual with you.  Please call the Pre-admissions Testing Dept. at [phone] if you have any questions about these instructions.  Surgery Visitation Policy:  Patients having surgery or a procedure may have two visitors.  Children under the age of 24 must have an adult with them who is not the patient.  Inpatient Visitation:    Visiting hours are 7 a.m. to 8 p.m. Up to four visitors are allowed at one time in a patient room. The visitors may rotate out with other people during the day.  One visitor age 69 or older may stay with the patient overnight and must be in the room by 8 p.m.     Preparing for Surgery with CHLORHEXIDINE GLUCONATE (CHG) Soap  Chlorhexidine Gluconate (CHG) Soap  o An antiseptic cleaner that kills germs and bonds with the skin to continue killing germs even after washing  o Used for showering the night before surgery and morning of surgery  Before surgery, you can play an important role by reducing the number of germs on your skin.  CHG (Chlorhexidine gluconate) soap is an antiseptic cleanser which kills germs and bonds with the skin to continue killing germs even after washing.  Please do not use if you have an allergy to CHG or antibacterial soaps. If your skin becomes reddened/irritated stop using the CHG.  1. Shower the NIGHT BEFORE SURGERY and the MORNING OF SURGERY with CHG soap.  2. If you choose to wash your hair, wash your hair first as usual with your normal shampoo.  3. After shampooing, rinse your hair and body thoroughly to remove the  shampoo.  4. Use CHG as you would any other liquid soap. You can apply CHG directly to the skin and wash gently with a scrungie or a clean washcloth.  5. Apply the CHG soap to your body only from the neck down. Do not use on open wounds or open sores. Avoid contact with your eyes, ears, mouth, and genitals (private parts). Wash face and genitals (private parts) with your normal soap.  6. Wash thoroughly, paying special attention to the area where your surgery will be performed.  7. Thoroughly rinse your body with warm water.  8. Do not shower/wash with your normal soap after using and rinsing off the CHG soap.  9. Pat yourself dry with a clean towel.  10. Wear clean pajamas to bed the night before surgery.  12. Place clean sheets on your bed the night of your first shower and do not sleep with pets.  13. Shower again with the CHG soap on the day of surgery prior to arriving at the hospital.  14. Do not apply any deodorants/lotions/powders.  15. Please wear  clean clothes to the hospital.

## 2023-02-12 ENCOUNTER — Encounter: Payer: Self-pay | Admitting: Surgery

## 2023-02-12 NOTE — Progress Notes (Signed)
Perioperative / Anesthesia Services  Pre-Admission Testing Clinical Review / Preoperative Anesthesia Consult  Date: 02/15/23  Patient Demographics:  Name: [name] DOB:   10/27/1945 MRN:   [id number]  Planned Surgical Procedure(s):    Case: [id number] Date/Time: 02/19/23 0947   Procedures:      XI ROBOTIC ASSISTED INGUINAL HERNIA (Right)     HERNIA REPAIR UMBILICAL ADULT, open   Anesthesia type: General   Pre-op diagnosis: right inguinal hernia, reducible umbilical hernia less 3 cm   Location: [facility] OR ROOM 07 / [facility]S FOR ANESTHESIA GROUP   Surgeons: Henrene Dodge, MD     NOTE: Available PAT nursing documentation and vital signs have been reviewed. Clinical nursing staff has updated patient's PMH/PSHx, current medication list, and drug allergies/intolerances to ensure comprehensive history available to assist in medical decision making as it pertains to the aforementioned surgical procedure and anticipated anesthetic course. Extensive review of available clinical information personally performed. [place] PMH and PSHx updated with any diagnoses/procedures that  may have been inadvertently omitted during his intake with the pre-admission testing department's nursing staff.  Clinical Discussion:  [name] is a 77 y.o. male who is submitted for pre-surgical anesthesia review and clearance prior to him undergoing the above procedure. Patient is a Former Games developer. 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  Patient is followed by cardiology Juliann Pares, MD). He was last seen in the cardiology clinic on 01/29/2023; notes reviewed.  At the time of his clinic visit, patient doing well overall from a cardiovascular perspective. Patient denied any chest pain, shortness of breath, PND, orthopnea, palpitations, significant peripheral edema, weakness, fatigue, vertiginous symptoms, or presyncope/syncope. Patient with a past medical history significant for cardiovascular diagnoses. Documented physical exam was grossly benign, providing no evidence of acute exacerbation and/or decompensation of the patient's known cardiovascular conditions.  Patient underwent diagnostic LEFT heart catheterization on 08/16/2016 revealing multivessel CAD; 99% proximal RCA, 75% mid RCA, 95% distal RCA, 90% RPDA, 40% LM, 70% ostial proximal LAD, 75% proximal to mid LAD, 50% distal LAD, 75% proximal LCx, 90% mid LCx, and 95% distal LCx.  Given the degree and complexity of his coronary artery disease, the patient was referred to CVTS for consultation regarding revascularization.  Patient underwent three-vessel CABG on 08/30/2016.  LIMA-LAD, SVG-PDA, and SVG-OM3 bypass grafts were placed.  Patient with recurrent unstable angina prompting repeat LEFT heart catheterization on 11/20/2016.  Native coronary artery stenoses unchanged from previous.  There was 100% stenosis noted in the proximal SVG-LCx and 75% stenosis in the ostial SVG-PDA bypass grafts.  Patient was referred back to [facility] for consideration of complex PCI.  Patient underwent complex PCI on 11/26/2016 placing a 3.5 x 22 mm Resolute Integrity DES to the ostial SVG-RCA stenosis.  Degree of stenosis decreased from 80% down to 0% post procedurally.  Balloon angioplasty was performed to the distal LAD lesion reducing degree of stenosis down to 70%.  Again, patient with recurrent angina despite previous interventions.  Patient underwent complex PCI on 12/17/2016 placing a total of 5 drug-eluting stents as follows:  3.0 x 12 mm Xience Alpine DES to the distal LCx 3.5 x 22 mm Resolute Onyx DES to the mid  LCx 3.5 x 8 mm  Resolute Onyx DES to the mid LCx 2.0 x 26 mm Resolute Onyx DES to the distal LAD 3.0 x 26 mm Resolute Onyx DES to the proximal LAD  Patient again with recurrent angina.  He underwent diagnostic LEFT heart catheterization on 06/09/2020 revealing 95% ISR within the ostial SVG-PDA bypass graft.  PCI was performed placing a 2.5 x 18 mm Resolute Onyx DES x 1 to the ostial SVG-PDA.  Patient with known aortic valve stenosis dating back to 2017, at which time the degree of stenosis was mild with a transvalvular gradient of 13.5 mmHg.  Since the time of diagnosis, degree of stenosis has been monitored serially via echocardiogram.  Most recent TTE was performed on 12/23/2022 revealing a mean transvalvular gradient of 15 mmHg; AVA (VTI) = 1.63 cm.  Echocardiogram performed on 09/24/2022 revealed severe pulmonary hypertension as evidenced by a RVSP of 74.7 mmHg.  This was in the setting of acute fluid overload.  Most recent TTE performed on 12/23/2022 did not list RVSP value, however based on TR Vmax of 2.6 m/s and a presumed right atrial pressure of 3 mmHg, his RVSP is estimated at 31.3 mmHg calculated using the following formula --> RVSP = 4 x (TR Vmax)2 + RAP.  Patient with an atrial fibrillation diagnosis; CHA2DS2-VASc Score = 6 (age x 2, CHF, HTN, vascular disease history, T2DM). His rate and rhythm are currently being maintained on oral labetalol. He is chronically anticoagulated using clopidogrel; reported to be compliant with therapy with no evidence or reports of GI bleeding.  Blood pressure well controlled at 110/60 mmHg on currently prescribed vasodilator (hydralazine), beta-blocker (labetalol) and diuretic (spironolactone + torsemide) therapies. He is on rosuvastatin for his HLD diagnosis and further ASCVD prevention. T2DM well controlled on currently prescribed regimen; last HgbA1c was 5.8% when checked on 12/21/2022. Patient does have an OSAH diagnosis, however he does not currently  utilize nocturnal PAP therapy.  Functional capacity somewhat limited by patient's age and multiple medical comorbidities.  With that said, patient able to complete all ADLs/IADLs independently without significant cardiovascular limitation.  Per the [facility], patient is able to complete at least 4 METS of physical activity without experiencing any significant degree of angina/anginal equivalent symptoms. No changes were made to his medication regimen. Patient to follow-up with outpatient cardiology in 6 months or sooner if needed.  [name] is scheduled for an elective XI ROBOTIC ASSISTED RIGHT INGUINAL HERNIA; OPEN UMBILICAL HERNIA REPAIR on 02/19/2023 with Dr. Ernesto Rutherford, MD.  Given patient's past medical history significant for cardiovascular diagnoses, presurgical cardiac clearance was sought by the PAT team. Per cardiology, "this patient is optimized for surgery and may proceed with the planned procedural course with a LOW risk of significant perioperative cardiovascular complications".  Again, this patient is on daily oral anticoagulation and antithrombotic therapies.  He has been instructed on recommendations for holding his apixaban and clopidogrel for 5 days prior to his procedure with plans to restart as soon as postoperative bleeding risk felt to be minimized by his attending surgeon. The patient has been instructed that his last dose of his apixaban and clopidogrel should be on 02/13/2023.  Patient denies previous perioperative complications with anesthesia in the past. In review of the available records, it is noted that patient underwent a general anesthetic course here at [facility] (ASA III) in 05/2021 without documented complications.      02/11/2023    2:10 PM 01/28/2023    8:29 AM 01/21/2023  10:15 AM  Vitals with BMI  Height 6\' 1"  6\' 1"  6\' 1"   Weight 184 lbs 10 oz 179 lbs 184 lbs 3 oz  BMI 24.36 23.62 24.31  Systolic  117 133  Diastolic  64 65   Pulse  54 63    Providers/Specialists:   NOTE: Primary physician provider listed below. Patient may have been seen by APP or partner within same practice.   PROVIDER ROLE / SPECIALTY LAST Eligha Bridegroom, MD General Surgery (Surgeon) 01/28/2023  Jaclyn Shaggy, MD Primary Care Provider ???  Rudean Hitt, MD Cardiology 01/29/2023  Owens Shark, MD Medical Oncology/Hematology 08/21/2022   Allergies:  Isosorbide  Current Home Medications:   No current facility-administered medications for this encounter.    acetaminophen (TYLENOL) 325 MG tablet   apixaban (ELIQUIS) 5 MG TABS tablet   cholecalciferol (CHOLECALCIFEROL) 25 MCG tablet   clopidogrel (PLAVIX) 75 MG tablet   dexlansoprazole (DEXILANT) 60 MG capsule   hydrALAZINE (APRESOLINE) 50 MG tablet   insulin glargine (LANTUS) 100 UNIT/ML injection   labetalol (NORMODYNE) 100 MG tablet   metFORMIN (GLUCOPHAGE-XR) 500 MG 24 hr tablet   rosuvastatin (CRESTOR) 40 MG tablet   sitaGLIPtin (JANUVIA) 100 MG tablet   spironolactone (ALDACTONE) 25 MG tablet   Testosterone 1.62 % GEL   torsemide (DEMADEX) 20 MG tablet   History:   Past Medical History:  Diagnosis Date   (HFpEF) heart failure with preserved ejection fraction (HCC) 07/25/2016   a.)TTE 07/25/16: EF >55, triv-mild pan regur, mild AS, G2DD; b.)TTE 09/02/16: EF >55, triv TR, mild AS; c.)TTE 11/05/16: EF >55, LAE, triv-mild pan regur, mild AS, G2DD; d.)TTE 01/21/20: EF >55, mod LVH, LAE/RVE, triv-mild AR/PR/TR, mod MR, mild AS, G2DD; e.)TTE 09/24/22: EF>55, sev LVH, sev LAE, RAE, mild PR, mod AR/MR/TR, mild-mod AS, RVSP 74.7; f.)TTE 12/23/22: EF 60-65, LAE, mild-mod MR/TR, mild AS   Anemia    Aortic atherosclerosis (HCC)    Aortic stenosis 07/25/2016   a.) TTE 07/25/2016: mild (MPG 13.5); b.) TTE 09/02/2016: mild (MPG 16); c.) TTE 11/05/2016: mild (MPG 9); d.) TTE 01/21/2020: mild (MPG 11.7); e.) TTE 09/24/2022: mild-mod (MPG 20.3); f.) TTE 12/23/2022: mild (MPG 15)    Arthritis    Atrial fibrillation (HCC)    a.) CHA2DS2VASc = 6 (age x2, CHF, HTN, vascular disease history, T2DM);  b.) rate/rhythm maintained on oral labetalol; chronically anticoagulated with apixaban + clopidogrel   AV block, 1st degree    B12 deficiency    CKD (chronic kidney disease), stage III (HCC)    Colon polyps    Coronary artery disease 08/16/2016   a.) s/p 3v CABG 08/30/2016; b.) s/p PCI 11/26/2016 (DES x 1 oSVG-RCA); c.) s/p PCI 12/17/2016 (DES x 5 --> mLCx x2, dLCx, pLAD, dLAD); c.) s/p PCI 06/09/2020 (DES x1 --> ISR oSVG-PDA)   Dyspnea    GERD (gastroesophageal reflux disease)    Hepatic steatosis    History of 2019 novel coronavirus disease (COVID-19) 09/02/2020   a.) Tx'd with remdesivir   History of blood transfusion    History of GI bleed 09/02/2020   a.) admitted to [facility] 09/02/2020 - 09/08/2020   History of heart artery stent 11/26/2016   TOTAL of 7 stents: a.) 11/26/2016 --> 3.5 x 22 mm Resolute Integrity oSVG-RCA; b.) 12/17/2016: 3.0 x 12 mm Xience Alpine dLCx, 3.5 x 22 mm Resolute Onyx mLCx, 3.5 x 18 mm Resolute Onyx mLCx, 3.0 x 26 mm Resolute Onyx pLAD, 2.0 x 26 mm Resolute Onyx dLAD; c.) 06/09/2020 -->  2.5 x 18 mm Resolute Onyx ISR oSVG-PDA   History of partial colectomy 08/12/2012   a.) large gastric hyperplastic polyp removal   HLD (hyperlipidemia)    Hypertension    LBBB (left bundle branch block)    Long term current use of anticoagulant    a.) apixaban   Long term current use of antithrombotics/antiplatelets    a.) clopidogrel   Male hypogonadism    a.) on exogenous TRT (1.62% gel)   Mild cardiomegaly    Mild gynecomastia (bilateral)    Nephrolithiasis    OSA (obstructive sleep apnea)    a.) does not utilize nocturnal PAP therapy   Pulmonary hypertension (HCC) 09/24/2022   a.) TTE 09/24/2022: RVSP 74.7; b.) TTE 12/23/2022: RVSP 31.3   RBBB (right bundle branch block with left anterior fascicular block)    Right inguinal hernia    Right thyroid  nodule    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   a.) LIMA-LAD, SVG-PDA, SVG-OM3   Type 2 diabetes mellitus treated with insulin (HCC)    Umbilical hernia    Unstable angina (HCC)    Vitamin D deficiency    Past Surgical History:  Procedure Laterality Date   APPENDECTOMY     CARDIAC CATHETERIZATION Left 08/16/2016   Procedure: Left Heart Cath and Coronary Angiography;  Surgeon: Alwyn Pea, MD;  Location: [facility] INVASIVE CV LAB;  Service: Cardiovascular;  Laterality: Left;   COLONOSCOPY N/A 09/07/2020   Procedure: COLONOSCOPY;  Surgeon: Pasty Spillers, MD;  Location: [facility] ENDOSCOPY;  Service: Endoscopy;  Laterality: N/A;   COLONOSCOPY WITH PROPOFOL N/A 05/17/2021   Procedure: COLONOSCOPY WITH PROPOFOL;  Surgeon: Pasty Spillers, MD;  Location: [facility] ENDOSCOPY;  Service: Endoscopy;  Laterality: N/A;   CORONARY ANGIOPLASTY WITH STENT PLACEMENT Left 11/26/2016   Procedure: CORONARY ANGIOPLASTY WITH STENT PLACEMENT; Location: [facility]; Surgeon: Jerrell Mylar, MD   CORONARY ARTERY BYPASS GRAFT N/A 08/30/2016   Procedure: CORONARY ARTERY BYPASS GRAFT; Location: [facility]; Surgeon: Nance Pew, MD   CORONARY STENT INTERVENTION N/A 06/09/2020   Procedure: CORONARY STENT INTERVENTION;  Surgeon: Alwyn Pea, MD;  Location: [facility] INVASIVE CV LAB;  Service: Cardiovascular;  Laterality: N/A;   CORONARY STENT INTERVENTION Left 12/17/2016   Procedure: CORONARY STENT INTERVENTION; Location: [facility]; Surgeon: Jerrell Mylar, MD   ENDOSCOPIC MUCOSAL RESECTION N/A 09/22/2020   Procedure: ENDOSCOPIC MUCOSAL RESECTION;  Surgeon: Lemar Lofty., MD;  Location: [facility] ENDOSCOPY;  Service: Gastroenterology;  Laterality: N/A;   ESOPHAGOGASTRODUODENOSCOPY N/A 09/07/2020   Procedure: ESOPHAGOGASTRODUODENOSCOPY (EGD);  Surgeon: Pasty Spillers, MD;  Location[facility] END[facility]COPY;  Service: Endoscopy;  Laterality: N/A;   ESOPHAGOGASTRODUODENOSCOPY (EGD) WITH PROPOFOL N/A 09/22/2020   Procedure: ESOPHAGOGASTRODUODENOSCOPY  (EGD) WITH PROPOFOL;  Surgeon: Meridee Score Netty Starring., MD;  Location: [facility] ENDOSCOPY;  Service: Gastroenterology;  Laterality: N/A;   FRACTURE SURGERY     HEMOSTASIS CLIP PLACEMENT  09/22/2020   Procedure: HEMOSTASIS CLIP PLACEMENT;  Surgeon: Lemar Lofty., MD;  Location: [facility] ENDOSCOPY;  Service: Gastroenterology;;   HEMOSTASIS CONTROL  09/22/2020   Procedure: HEMOSTASIS CONTROL;  Surgeon: Lemar Lofty., MD;  Location: [facility] ENDOSCOPY;  Service: Gastroenterology;;   LAPAROSCOPIC PARTIAL RIGHT COLECTOMY Right 08/12/2012   Procedure: LAPAROSCOPIC PARTIAL RIGHT COLECTOMY; Location: [facility]; Surgeon: Renda Rolls, MD   LEFT HEART CATH AND CORONARY ANGIOGRAPHY Left 06/09/2020   Procedure: LEFT HEART CATH AND CORONARY ANGIOGRAPHY;  Surgeon: Alwyn Pea, MD;  Location: [facility] INVASIVE CV LAB;  Service: Cardiovascular;  Laterality: Left;   LEFT HEART CATH AND CORS/GRAFTS ANGIOGRAPHY Left  11/20/2016   Procedure: Left Heart Cath and Cors/Grafts Angiography;  Surgeon: Alwyn Pea, MD;  Location: [facility] INVASIVE CV LAB;  Service: Cardiovascular;  Laterality: Left;   POLYPECTOMY  09/22/2020   Procedure: POLYPECTOMY;  Surgeon: Mansouraty, Netty Starring., MD;  Location: [facility] ENDOSCOPY;  Service: Gastroenterology;;   SUBMUCOSAL LIFTING INJECTION  09/22/2020   Procedure: SUBMUCOSAL LIFTING INJECTION;  Surgeon: Lemar Lofty., MD;  Location: [facility] ENDOSCOPY;  Service: Gastroenterology;;   No family history on file. Social History   Tobacco Use   Smoking status: Former    Types: Cigarettes    Passive exposure: Never   Smokeless tobacco: Never  Vaping Use   Vaping Use: Never used  Substance Use Topics   Alcohol use: No   Drug use: No    Pertinent Clinical Results:  LABS:   Lab Results  Component Value Date   WBC 5.8 12/31/2022   HGB 10.0 (L) 01/18/2023   HCT 30.4 (L) 01/18/2023   MCV 93.5 12/31/2022   PLT 189 12/31/2022   Lab Results  Component Value Date   NA 133 (L)  12/27/2022   K 4.0 12/27/2022   CO2 33 (H) 12/27/2022   GLUCOSE 152 (H) 12/27/2022   BUN 50 (H) 12/27/2022   CREATININE 1.73 (H) 12/27/2022   CALCIUM 10.6 (H) 12/27/2022   GFRNONAA 41 (L) 12/27/2022    ECG: Date: 12/20/2022 Time ECG obtained: 1822 PM Rate: 75 bpm Rhythm: atrial fibrillation with PVCs; RBBB Intervals: QRS 178 ms. QTc 486 ms. ST segment and T wave changes: No evidence of acute ST segment elevation or depression Comparison: Similar to previous tracing obtained on 12/03/2022   IMAGING / PROCEDURES: TRANSTHORACIC ECHOCARDIOGRAM performed on 12/23/2022 Left ventricular ejection fraction, by estimation, is 60 to 65%. The left ventricle has normal function. The left ventricle has no regional wall motion abnormalities. Left ventricular diastolic parameters were normal.  Right ventricular systolic function is normal. The right ventricular size is normal.  Left atrial size was mild to moderately dilated.  The mitral valve is normal in structure. Moderate mitral valve regurgitation. No evidence of mitral stenosis.  The aortic valve is normal in structure. Aortic valve regurgitation is not visualized. Mild to moderate aortic valve stenosis.  The inferior vena cava is normal in size with greater than 50% respiratory variability, suggesting right atrial pressure of 3 mmHg.   DIAGNOSTIC RADIOGRAPHS OF CHEST PORTABLE 1 VIEW performed on 12/20/2022 Underinflation with BILATERAL small pleural effusions; new from previous.  Adjacent opacity. Vascular congestion No pneumothorax Status post median sternotomy The heart is enlarged Overlapping cardiac leads noted  CTA CHEST WITH CONTRAST performed on 09/01/2020 Dependent reticular and groundglass opacities, likely atelectasis. No pleural effusion.  Coronary artery calcifications.  Scattered atherosclerotic calcifications.  Sequela of CABG.  Normal caliber thoracic aorta which is mildly tortuous.   Right hilar node measures 1.3 cm  in short axis (4:84).  Multiple additional mediastinal nodes which are not pathologically enlarged.  Subcentimeter calcified granuloma within the liver.  There is diffuse hepatic steatosis.  Partially visualized nonobstructive left renal calculus (4:189).  There is fatty infiltration of the pancreatic parenchyma. The pancreas is atrophic.  The spleen is enlarged.  Mild bilateral gynecomastia.  There is a 1.7 cm sebaceous cyst of the lateral left chest wall (4:113).  Right thyroid nodule is obscured by streak artifact from large contrast bolus. This measures approximately 1.9 cm with foci of calcification (4:38).  Multilevel degenerative disc disease.  Diffuse flowing anterior osteophytosis, which is nonspecific  but can be seen in diffuse idiopathic skeletal hyperostosis.  LEFT HEART CATHETERIZATION AND CORONARY ANGIOGRAPHY performed on 06/09/2020 Normal left ventricular systolic function with an EF of 60% Multivessel CAD 40% LM 99% proximal RCA 75% mid RCA 95% distal RCA 90% RPDA Bypass grafts LIMA-LAD incompletely evaluated-probably occluded SVG-OM 3 100% occluded 95% ostial ISR SVG-PDA Successful PCI 2.5 x 18 mm resolute Onyx DES x 1 to the ostial SVG-PDA lesion yielding reduction to 0% and TIMI-3 flow   MYOCARDIAL PERFUSION IMAGING STUDY (LEXISCAN) performed on 01/21/2020 Severely abnormal myocardial perfusion scan left ventricular enlargement Overall ejection fraction of 52%  Multiple large areas of anterior inferior and lateral wall defect consistent with ischemia.   Recommend further evaluation of symptoms warrant possibly invasively   CORONARY ARTERY BYPASS GRAFTING performed on  08/30/2016 3 vessel revasculatization LIMA-LAD SVG-PDA SVG-OM3  Impression and Plan:  [name] has been referred for pre-anesthesia review and clearance prior to him undergoing the planned anesthetic and procedural courses. Available labs, pertinent testing, and imaging results were  personally reviewed by me in preparation for upcoming operative/procedural course. [facility] medical record has been updated following extensive record review and patient interview with PAT staff.   This patient has been appropriately cleared by cardiology with an overall LOW risk of experiencing significant perioperative cardiovascular complications. Based on clinical review performed today (02/15/23), barring any significant acute changes in the patient's overall condition, it is anticipated that he will be able to proceed with the planned surgical intervention. Any acute changes in clinical condition may necessitate his procedure being postponed and/or cancelled. Patient will meet with anesthesia team (MD and/or CRNA) on the day of his procedure for preoperative evaluation/assessment. Questions regarding anesthetic course will be fielded at that time.   Pre-surgical instructions were reviewed with the patient during his PAT appointment, and questions were fielded to satisfaction by PAT clinical staff. He has been instructed on which medications that he will need to hold prior to surgery, as well as the ones that have been deemed safe/appropriate to take on the day of his procedure. As part of the general education provided by PAT, patient made aware both verbally and in writing, that he would need to abstain from the use of any illegal substances during his perioperative course.  He was advised that failure to follow the provided instructions could necessitate case cancellation or result in serious perioperative complications up to and including death. Patient encouraged to contact PAT and/or his surgeon's office to discuss any questions or concerns that may arise prior to surgery; verbalized understanding.   Quentin Mulling, MSN, APRN, FNP-C, CEN [facility]  Peri-operative Services Nurse Practitioner Phone: [phone] Fax: [phone] 02/15/23 10:27 AM  NOTE: This note has  been prepared using Dragon dictation software. Despite my best ability to proofread, there is always the potential that unintentional transcriptional errors may still occur from this process.

## 2023-02-15 ENCOUNTER — Encounter: Payer: Self-pay | Admitting: Surgery

## 2023-02-19 ENCOUNTER — Encounter: Payer: Self-pay | Admitting: Surgery

## 2023-02-19 ENCOUNTER — Encounter
Admission: RE | Disposition: A | Discharge status: Home / Self Care | Payer: Self-pay | Source: Home / Self Care | Attending: Surgery

## 2023-02-19 ENCOUNTER — Ambulatory Visit: Payer: Medicare HMO | Admitting: Urgent Care

## 2023-02-19 ENCOUNTER — Ambulatory Visit
Admission: RE | Admit: 2023-02-19 | Discharge: 2023-02-19 | Disposition: A | Discharge status: Home / Self Care | Payer: Medicare HMO | Source: Home / Self Care | Attending: Surgery | Admitting: Surgery

## 2023-02-19 ENCOUNTER — Other Ambulatory Visit: Payer: Self-pay

## 2023-02-19 DIAGNOSIS — K4090 Unilateral inguinal hernia, without obstruction or gangrene, not specified as recurrent: Secondary | ICD-10-CM

## 2023-02-19 DIAGNOSIS — K219 Gastro-esophageal reflux disease without esophagitis: Secondary | ICD-10-CM | POA: Diagnosis not present

## 2023-02-19 DIAGNOSIS — Z7984 Long term (current) use of oral hypoglycemic drugs: Secondary | ICD-10-CM | POA: Diagnosis not present

## 2023-02-19 DIAGNOSIS — K429 Umbilical hernia without obstruction or gangrene: Secondary | ICD-10-CM

## 2023-02-19 DIAGNOSIS — M1990 Unspecified osteoarthritis, unspecified site: Secondary | ICD-10-CM | POA: Insufficient documentation

## 2023-02-19 DIAGNOSIS — E1122 Type 2 diabetes mellitus with diabetic chronic kidney disease: Secondary | ICD-10-CM | POA: Insufficient documentation

## 2023-02-19 DIAGNOSIS — I5032 Chronic diastolic (congestive) heart failure: Secondary | ICD-10-CM | POA: Insufficient documentation

## 2023-02-19 DIAGNOSIS — Z6824 Body mass index (BMI) 24.0-24.9, adult: Secondary | ICD-10-CM | POA: Insufficient documentation

## 2023-02-19 DIAGNOSIS — Z951 Presence of aortocoronary bypass graft: Secondary | ICD-10-CM | POA: Diagnosis not present

## 2023-02-19 DIAGNOSIS — Z87891 Personal history of nicotine dependence: Secondary | ICD-10-CM | POA: Diagnosis not present

## 2023-02-19 DIAGNOSIS — N183 Chronic kidney disease, stage 3 unspecified: Secondary | ICD-10-CM | POA: Insufficient documentation

## 2023-02-19 DIAGNOSIS — I350 Nonrheumatic aortic (valve) stenosis: Secondary | ICD-10-CM | POA: Diagnosis not present

## 2023-02-19 DIAGNOSIS — I272 Pulmonary hypertension, unspecified: Secondary | ICD-10-CM | POA: Insufficient documentation

## 2023-02-19 DIAGNOSIS — N400 Enlarged prostate without lower urinary tract symptoms: Secondary | ICD-10-CM | POA: Diagnosis not present

## 2023-02-19 DIAGNOSIS — Z794 Long term (current) use of insulin: Secondary | ICD-10-CM | POA: Insufficient documentation

## 2023-02-19 DIAGNOSIS — Z79899 Other long term (current) drug therapy: Secondary | ICD-10-CM | POA: Insufficient documentation

## 2023-02-19 DIAGNOSIS — I700 Atherosclerosis of aorta: Secondary | ICD-10-CM | POA: Diagnosis not present

## 2023-02-19 DIAGNOSIS — E291 Testicular hypofunction: Secondary | ICD-10-CM | POA: Insufficient documentation

## 2023-02-19 DIAGNOSIS — I25110 Atherosclerotic heart disease of native coronary artery with unstable angina pectoris: Secondary | ICD-10-CM | POA: Diagnosis not present

## 2023-02-19 DIAGNOSIS — I130 Hypertensive heart and chronic kidney disease with heart failure and stage 1 through stage 4 chronic kidney disease, or unspecified chronic kidney disease: Secondary | ICD-10-CM | POA: Insufficient documentation

## 2023-02-19 DIAGNOSIS — E669 Obesity, unspecified: Secondary | ICD-10-CM | POA: Diagnosis not present

## 2023-02-19 DIAGNOSIS — I4891 Unspecified atrial fibrillation: Secondary | ICD-10-CM | POA: Diagnosis not present

## 2023-02-19 DIAGNOSIS — Z955 Presence of coronary angioplasty implant and graft: Secondary | ICD-10-CM | POA: Insufficient documentation

## 2023-02-19 DIAGNOSIS — G4733 Obstructive sleep apnea (adult) (pediatric): Secondary | ICD-10-CM | POA: Insufficient documentation

## 2023-02-19 DIAGNOSIS — Z7902 Long term (current) use of antithrombotics/antiplatelets: Secondary | ICD-10-CM | POA: Diagnosis not present

## 2023-02-19 HISTORY — DX: Unilateral inguinal hernia, without obstruction or gangrene, not specified as recurrent: K40.90

## 2023-02-19 HISTORY — DX: Type 2 diabetes mellitus without complications: Z79.4

## 2023-02-19 HISTORY — DX: Hyperlipidemia, unspecified: E78.5

## 2023-02-19 HISTORY — DX: Atherosclerosis of aorta: I70.0

## 2023-02-19 HISTORY — DX: Testicular hypofunction: E29.1

## 2023-02-19 HISTORY — DX: Calculus of kidney: N20.0

## 2023-02-19 HISTORY — PX: INSERTION OF MESH: SHX5868

## 2023-02-19 HISTORY — DX: Cardiomegaly: I51.7

## 2023-02-19 HISTORY — DX: Polyp of colon: K63.5

## 2023-02-19 HISTORY — DX: Deficiency of other specified B group vitamins: E53.8

## 2023-02-19 HISTORY — DX: Obstructive sleep apnea (adult) (pediatric): G47.33

## 2023-02-19 HISTORY — DX: Fatty (change of) liver, not elsewhere classified: K76.0

## 2023-02-19 HISTORY — DX: Umbilical hernia without obstruction or gangrene: K42.9

## 2023-02-19 HISTORY — PX: XI ROBOTIC ASSISTED INGUINAL HERNIA REPAIR WITH MESH: SHX6706

## 2023-02-19 HISTORY — DX: Pulmonary hypertension, unspecified: I27.20

## 2023-02-19 HISTORY — DX: Left bundle-branch block, unspecified: I44.7

## 2023-02-19 HISTORY — DX: Heart failure, unspecified: I50.9

## 2023-02-19 HISTORY — DX: Unstable angina: I20.0

## 2023-02-19 HISTORY — DX: Long term (current) use of antithrombotics/antiplatelets: Z79.02

## 2023-02-19 HISTORY — DX: Long term (current) use of anticoagulants: Z79.01

## 2023-02-19 HISTORY — DX: Hypertrophy of breast: N62

## 2023-02-19 HISTORY — PX: UMBILICAL HERNIA REPAIR: SHX196

## 2023-02-19 HISTORY — DX: Chronic kidney disease, stage 3 unspecified: N18.30

## 2023-02-19 HISTORY — DX: Type 2 diabetes mellitus without complications: E11.9

## 2023-02-19 HISTORY — DX: Nontoxic single thyroid nodule: E04.1

## 2023-02-19 HISTORY — DX: Vitamin D deficiency, unspecified: E55.9

## 2023-02-19 HISTORY — DX: Bifascicular block: I45.2

## 2023-02-19 LAB — GLUCOSE, CAPILLARY
Glucose-Capillary: 129 mg/dL — ABNORMAL HIGH (ref 70–99)
Glucose-Capillary: 154 mg/dL — ABNORMAL HIGH (ref 70–99)

## 2023-02-19 SURGERY — XI ROBOTIC ASSISTED INGUINAL HERNIA REPAIR WITH MESH
Anesthesia: General | Laterality: Right

## 2023-02-19 MED ORDER — CHLORHEXIDINE GLUCONATE 0.12 % MT SOLN
OROMUCOSAL | Status: AC
  Filled 2023-02-19: qty 15

## 2023-02-19 MED ORDER — OXYCODONE HCL 5 MG PO TABS
5.0000 mg | ORAL_TABLET | Freq: Once | ORAL | Status: AC | PRN
  Administered 2023-02-19: 5 mg via ORAL

## 2023-02-19 MED ORDER — CHLORHEXIDINE GLUCONATE CLOTH 2 % EX PADS: 6.0000 | MEDICATED_PAD | Freq: Once | CUTANEOUS | Status: DC

## 2023-02-19 MED ORDER — ONDANSETRON HCL 4 MG/2ML IJ SOLN
INTRAMUSCULAR | Status: DC | PRN
  Administered 2023-02-19: 4 mg via INTRAVENOUS

## 2023-02-19 MED ORDER — FENTANYL CITRATE (PF) 100 MCG/2ML IJ SOLN
INTRAMUSCULAR | Status: DC | PRN
  Administered 2023-02-19 (×2): 50 ug via INTRAVENOUS

## 2023-02-19 MED ORDER — ROCURONIUM BROMIDE 100 MG/10ML IV SOLN
INTRAVENOUS | Status: DC | PRN
  Administered 2023-02-19: 50 mg via INTRAVENOUS
  Administered 2023-02-19 (×2): 10 mg via INTRAVENOUS
  Administered 2023-02-19: 20 mg via INTRAVENOUS

## 2023-02-19 MED ORDER — EPHEDRINE SULFATE (PRESSORS) 50 MG/ML IJ SOLN
INTRAMUSCULAR | Status: DC | PRN
  Administered 2023-02-19 (×2): 5 mg via INTRAVENOUS

## 2023-02-19 MED ORDER — ACETAMINOPHEN 500 MG PO TABS: 1000.0000 mg | ORAL_TABLET | Freq: Four times a day (QID) | ORAL | Status: DC | PRN

## 2023-02-19 MED ORDER — CHLORHEXIDINE GLUCONATE 0.12 % MT SOLN
15.0000 mL | Freq: Once | OROMUCOSAL | Status: AC
  Administered 2023-02-19: 15 mL via OROMUCOSAL

## 2023-02-19 MED ORDER — ACETAMINOPHEN 10 MG/ML IV SOLN: 1000.0000 mg | Freq: Once | INTRAVENOUS | Status: DC | PRN

## 2023-02-19 MED ORDER — OXYCODONE HCL 5 MG/5ML PO SOLN: 5.0000 mg | Freq: Once | ORAL | Status: AC | PRN

## 2023-02-19 MED ORDER — HEMOSTATIC AGENTS (NO CHARGE) OPTIME
TOPICAL | Status: DC | PRN
  Administered 2023-02-19: 1 via TOPICAL

## 2023-02-19 MED ORDER — FENTANYL CITRATE (PF) 100 MCG/2ML IJ SOLN
INTRAMUSCULAR | Status: AC
  Filled 2023-02-19: qty 2

## 2023-02-19 MED ORDER — PROPOFOL 10 MG/ML IV BOLUS
INTRAVENOUS | Status: DC | PRN
  Administered 2023-02-19: 150 mg via INTRAVENOUS

## 2023-02-19 MED ORDER — HYDROMORPHONE HCL 1 MG/ML IJ SOLN
INTRAMUSCULAR | Status: DC | PRN
  Administered 2023-02-19 (×2): .5 mg via INTRAVENOUS

## 2023-02-19 MED ORDER — DEXAMETHASONE SODIUM PHOSPHATE 10 MG/ML IJ SOLN
INTRAMUSCULAR | Status: DC | PRN
  Administered 2023-02-19: 10 mg via INTRAVENOUS

## 2023-02-19 MED ORDER — BUPIVACAINE LIPOSOME 1.3 % IJ SUSP: 20.0000 mL | Freq: Once | INTRAMUSCULAR | Status: DC

## 2023-02-19 MED ORDER — CEFAZOLIN SODIUM-DEXTROSE 2-4 GM/100ML-% IV SOLN
2.0000 g | INTRAVENOUS | Status: AC
  Administered 2023-02-19: 2 g via INTRAVENOUS

## 2023-02-19 MED ORDER — ACETAMINOPHEN 500 MG PO TABS
1000.0000 mg | ORAL_TABLET | ORAL | Status: AC
  Administered 2023-02-19: 1000 mg via ORAL

## 2023-02-19 MED ORDER — SODIUM CHLORIDE 0.9 % IV SOLN: INTRAVENOUS | Status: DC

## 2023-02-19 MED ORDER — SUGAMMADEX SODIUM 200 MG/2ML IV SOLN
INTRAVENOUS | Status: DC | PRN
  Administered 2023-02-19: 200 mg via INTRAVENOUS

## 2023-02-19 MED ORDER — LACTATED RINGERS IV SOLN: INTRAVENOUS | Status: DC

## 2023-02-19 MED ORDER — LACTATED RINGERS IV SOLN: INTRAVENOUS | Status: DC | PRN

## 2023-02-19 MED ORDER — ONDANSETRON HCL 4 MG/2ML IJ SOLN: 4.0000 mg | Freq: Once | INTRAMUSCULAR | Status: DC | PRN

## 2023-02-19 MED ORDER — LIDOCAINE HCL (CARDIAC) PF 100 MG/5ML IV SOSY
PREFILLED_SYRINGE | INTRAVENOUS | Status: DC | PRN
  Administered 2023-02-19: 80 mg via INTRAVENOUS

## 2023-02-19 MED ORDER — OXYCODONE HCL 5 MG PO TABS
5.0000 mg | ORAL_TABLET | ORAL | 0 refills | Status: DC | PRN
Start: 2023-02-19 — End: 2023-03-06

## 2023-02-19 MED ORDER — OXYCODONE HCL 5 MG PO TABS
ORAL_TABLET | ORAL | Status: AC
  Filled 2023-02-19: qty 1

## 2023-02-19 MED ORDER — ORAL CARE MOUTH RINSE: 15.0000 mL | Freq: Once | OROMUCOSAL | Status: AC

## 2023-02-19 MED ORDER — BUPIVACAINE-EPINEPHRINE (PF) 0.5% -1:200000 IJ SOLN
INTRAMUSCULAR | Status: DC | PRN
  Administered 2023-02-19: 30 mL

## 2023-02-19 MED ORDER — GABAPENTIN 100 MG PO CAPS
200.0000 mg | ORAL_CAPSULE | ORAL | Status: AC
  Administered 2023-02-19: 200 mg via ORAL

## 2023-02-19 MED ORDER — BUPIVACAINE LIPOSOME 1.3 % IJ SUSP
INTRAMUSCULAR | Status: AC
  Filled 2023-02-19: qty 20

## 2023-02-19 MED ORDER — CEFAZOLIN SODIUM-DEXTROSE 2-4 GM/100ML-% IV SOLN
INTRAVENOUS | Status: AC
  Filled 2023-02-19: qty 100

## 2023-02-19 MED ORDER — HYDROMORPHONE HCL 1 MG/ML IJ SOLN
INTRAMUSCULAR | Status: AC
  Filled 2023-02-19: qty 1

## 2023-02-19 MED ORDER — ACETAMINOPHEN 500 MG PO TABS
ORAL_TABLET | ORAL | Status: AC
  Filled 2023-02-19: qty 2

## 2023-02-19 MED ORDER — BUPIVACAINE LIPOSOME 1.3 % IJ SUSP
INTRAMUSCULAR | Status: DC | PRN
  Administered 2023-02-19: 20 mL

## 2023-02-19 MED ORDER — GABAPENTIN 100 MG PO CAPS
ORAL_CAPSULE | ORAL | Status: AC
  Filled 2023-02-19: qty 2

## 2023-02-19 MED ORDER — EPINEPHRINE PF 1 MG/ML IJ SOLN
INTRAMUSCULAR | Status: AC
  Filled 2023-02-19: qty 1

## 2023-02-19 MED ORDER — 0.9 % SODIUM CHLORIDE (POUR BTL) OPTIME
TOPICAL | Status: DC | PRN
  Administered 2023-02-19: 500 mL

## 2023-02-19 MED ORDER — BUPIVACAINE HCL (PF) 0.5 % IJ SOLN
INTRAMUSCULAR | Status: AC
  Filled 2023-02-19: qty 30

## 2023-02-19 MED ORDER — PHENYLEPHRINE HCL-NACL 20-0.9 MG/250ML-% IV SOLN
INTRAVENOUS | Status: DC | PRN
  Administered 2023-02-19: 20 ug/min via INTRAVENOUS

## 2023-02-19 MED ORDER — FENTANYL CITRATE (PF) 100 MCG/2ML IJ SOLN: 25.0000 ug | INTRAMUSCULAR | Status: DC | PRN

## 2023-02-19 SURGICAL SUPPLY — 61 items
ADH SKN CLS APL DERMABOND .7 (GAUZE/BANDAGES/DRESSINGS) ×3
APL PRP STRL LF DISP 70% ISPRP (MISCELLANEOUS) ×3
APL SRG 38 LTWT LNG FL B (MISCELLANEOUS) ×3
APPLICATOR ARISTA FLEXITIP XL (MISCELLANEOUS) ×3
BLADE SURG 15 STRL LF DISP TIS (BLADE) ×3
BLADE SURG 15 STRL SS (BLADE) ×3
CHLORAPREP W/TINT 26 (MISCELLANEOUS) ×3
COVER TIP SHEARS 8 DVNC (MISCELLANEOUS)
COVER WAND RF STERILE (DRAPES) ×3
DERMABOND ADVANCED .7 DNX12 (GAUZE/BANDAGES/DRESSINGS) ×3
DRAPE ARM DVNC X/XI (DISPOSABLE) ×9
DRAPE COLUMN DVNC XI (DISPOSABLE) ×3
DRAPE LAPAROTOMY 77X122 PED (DRAPES) ×3
ELECT CAUTERY BLADE TIP 2.5 (TIP) ×3
ELECT REM PT RETURN 9FT ADLT (ELECTROSURGICAL) ×3
FORCEPS BPLR R/ABLATION 8 DVNC (INSTRUMENTS) ×3
GAUZE 4X4 16PLY ~~LOC~~+RFID DBL (SPONGE) ×3
GLOVE SURG SYN 7.0 (GLOVE) ×6 IMPLANT
GLOVE SURG SYN 7.5 E (GLOVE) ×6 IMPLANT
GOWN STRL REUS W/ TWL LRG LVL3 (GOWN DISPOSABLE) ×12
GOWN STRL REUS W/TWL LRG LVL3 (GOWN DISPOSABLE) ×12
HEMOSTAT ARISTA ABSORB 1G (HEMOSTASIS) ×3
IRRIGATOR STRYKERFLOW (MISCELLANEOUS)
IV NS 1000ML (IV SOLUTION)
IV NS 1000ML BAXH (IV SOLUTION)
KIT PINK PAD W/HEAD ARE REST (MISCELLANEOUS) ×3
LABEL OR SOLS (LABEL) ×3
MANIFOLD NEPTUNE II (INSTRUMENTS) ×3
MESH 3DMAX MID 4X6 RT LRG (Mesh General) ×3 IMPLANT
MESH VENTRALEX ST 8CM LRG (Mesh General) ×3 IMPLANT
NDL DRIVE SUT CUT DVNC (INSTRUMENTS) ×3 IMPLANT
NDL HYPO 22X1.5 SAFETY MO (MISCELLANEOUS) ×3 IMPLANT
NDL INSUFFLATION 14GA 120MM (NEEDLE) ×3 IMPLANT
NEEDLE DRIVE SUT CUT DVNC (INSTRUMENTS) ×3
NEEDLE HYPO 22X1.5 SAFETY MO (MISCELLANEOUS) ×3
NEEDLE INSUFFLATION 14GA 120MM (NEEDLE) ×3
NS IRRIG 500ML POUR BTL (IV SOLUTION) ×3
OBTURATOR OPTICAL STND 8 DVNC (TROCAR) ×3
PACK BASIN MINOR ARMC (MISCELLANEOUS)
PACK LAP CHOLECYSTECTOMY (MISCELLANEOUS) ×3
PENCIL SMOKE EVACUATOR (MISCELLANEOUS) ×3
SCISSORS MNPLR CVD DVNC XI (INSTRUMENTS) ×3
SEAL UNIV 5-12 XI (MISCELLANEOUS) ×9
SET TUBE SMOKE EVAC HIGH FLOW (TUBING) ×3
SOL ELECTROSURG ANTI STICK (MISCELLANEOUS) ×3
SPONGE T-LAP 18X18 ~~LOC~~+RFID (SPONGE)
SUT ETHIBOND 0 MO6 C/R (SUTURE) ×3
SUT MNCRL AB 4-0 PS2 18 (SUTURE) ×6
SUT PROLENE 2 0 SH DA (SUTURE) ×3
SUT VIC AB 2-0 SH 27 (SUTURE) ×6
SUT VIC AB 2-0 SH 27XBRD (SUTURE) ×6
SUT VIC AB 3-0 SH 27 (SUTURE) ×6
SUT VIC AB 3-0 SH 27X BRD (SUTURE) ×6
SUT VICRYL 0 UR6 27IN ABS (SUTURE) ×3
SUT VLOC 90 S/L VL9 GS22 (SUTURE) ×3
SYR 20ML LL LF (SYRINGE) ×3
SYR BULB IRRIG 60ML STRL (SYRINGE)
TAPE TRANSPORE STRL 2 31045 (GAUZE/BANDAGES/DRESSINGS) ×3
TRAP FLUID SMOKE EVACUATOR (MISCELLANEOUS)
TRAY FOLEY SLVR 16FR LF STAT (SET/KITS/TRAYS/PACK) ×3
WATER STERILE IRR 500ML POUR (IV SOLUTION) ×3

## 2023-02-19 NOTE — Discharge Instructions (Addendum)
Discharge Instructions: 1.  Patient may shower, but do not scrub wounds heavily and dab dry only. 2.  Do not submerge wounds in pool/tub until fully healed. 3.  Do not apply ointments or hydrogen peroxide to the wounds. 4.  May apply ice packs to the wounds for comfort. 5.  It is normal for there to be swelling and bruising of the right groin and incisions after this surgery.  This will resolve on its own. 6.  May wear tighter underwear (sports underwear) to help compress the groin area better to decrease the amount of swelling. 7.  May resume your Plavix on 02/21/23 as a morning dose, and your Eliquis on 02/21/23 evening dose (no morning dose).   AMBULATORY SURGERY  DISCHARGE INSTRUCTIONS   The drugs that you were given will stay in your system until tomorrow so for the next 24 hours you should not:  Drive an automobile Make any legal decisions Drink any alcoholic beverage   You may resume regular meals tomorrow.  Today it is better to start with liquids and gradually work up to solid foods.  You may eat anything you prefer, but it is better to start with liquids, then soup and crackers, and gradually work up to solid foods.   Please notify your doctor immediately if you have any unusual bleeding, trouble breathing, redness and pain at the surgery site, drainage, fever, or pain not relieved by medication.    Additional Instructions:        Please contact your physician with any problems or Same Day Surgery at [phone], Monday through Friday 6 am to 4 pm, or [place] at [facility] number at [phone].

## 2023-02-19 NOTE — Anesthesia Postprocedure Evaluation (Signed)
Anesthesia Post Note  Patient: [name]  Procedure(s) Performed: XI ROBOTIC ASSISTED INGUINAL HERNIA REPAIR WITH MESH (Right) HERNIA REPAIR UMBILICAL ADULT, open INSERTION OF MESH  Patient location during evaluation: PACU Anesthesia Type: General Level of consciousness: awake and alert, oriented and patient cooperative Pain management: pain level controlled Vital Signs Assessment: post-procedure vital signs reviewed and stable Respiratory status: spontaneous breathing, nonlabored ventilation and respiratory function stable Cardiovascular status: blood pressure returned to baseline and stable Postop Assessment: adequate PO intake Anesthetic complications: no   No notable events documented.   Last Vitals:  Vitals:   02/19/23 1329 02/19/23 1338  BP: (!) 157/73 134/64  Pulse: 69 60  Resp: 15 17  Temp: 36.6 C (!) 36.3 C  SpO2: 93% 98%    Last Pain:  Vitals:   02/19/23 1338  TempSrc: Temporal  PainSc: 2                  Reed Breech

## 2023-02-19 NOTE — Transfer of Care (Signed)
Immediate Anesthesia Transfer of Care Note  Patient: [name]  Procedure(s) Performed: XI ROBOTIC ASSISTED INGUINAL HERNIA REPAIR WITH MESH (Right) HERNIA REPAIR UMBILICAL ADULT, open INSERTION OF MESH  Patient Location: PACU  Anesthesia Type:General  Level of Consciousness: awake  Airway & Oxygen Therapy: Patient Spontanous Breathing and Patient connected to face mask oxygen  Post-op Assessment: Report given to RN and Post -op Vital signs reviewed and stable  Post vital signs: Reviewed  Last Vitals:  Vitals Value Taken Time  BP 160/91 02/19/23 1254  Temp 86F   Pulse 65 02/19/23 1259  Resp 13 02/19/23 1259  SpO2 100 % 02/19/23 1259  Vitals shown include unvalidated device data.  Last Pain:  Vitals:   02/19/23 0914  TempSrc: Temporal  PainSc: 0-No pain         Complications: No notable events documented.

## 2023-02-19 NOTE — Anesthesia Preprocedure Evaluation (Addendum)
Anesthesia Evaluation  Patient identified by MRN, date of birth, ID band Patient awake    Reviewed: Allergy & Precautions, NPO status , Patient's Chart, lab work & pertinent test results  History of Anesthesia Complications Negative for: history of anesthetic complications  Airway Mallampati: III   Neck ROM: Full    Dental  (+) Missing   Pulmonary sleep apnea , former smoker   Pulmonary exam normal breath sounds clear to auscultation       Cardiovascular hypertension, pulmonary hypertension+ CAD (s/p stents and CABG 2018 on Plavix)  + dysrhythmias (a fib on Eliquis) + Valvular Problems/Murmurs AS  Rhythm:Irregular Rate:Normal + Systolic murmurs ECG 12/20/22:  Atrial fibrillation Ventricular premature complex Right bundle branch block Baseline wander in lead(s) V3 V5  Echo 12/23/22:  1. Left ventricular ejection fraction, by estimation, is 60 to 65%. The left ventricle has normal function. The left ventricle has no regional wall motion abnormalities. Left ventricular diastolic parameters were normal.  2. Right ventricular systolic function is normal. The right ventricular size is normal.  3. Left atrial size was mild to moderately dilated.  4. The mitral valve is normal in structure. Moderate mitral valve regurgitation. No evidence of mitral stenosis.  5. The aortic valve is normal in structure. Aortic valve regurgitation is not visualized. Mild to moderate aortic valve stenosis.  6. The inferior vena cava is normal in size with greater than 50% respiratory variability, suggesting right atrial pressure of 3 mmHg.   Myocardial perfusion 01/21/20:  Severely abnormal myocardial perfusion scan left ventricular enlargement moderately overall ejection fraction of 52% multiple large area of anterior inferior and lateral wall defect consistent with ischemia. Recommend further evaluation of symptoms warrant possibly invasively     Neuro/Psych negative neurological ROS     GI/Hepatic ,GERD  ,,  Endo/Other  diabetes, Type 2  Obesity   Renal/GU Renal disease (stage III CKD; nephrolithiasis)     Musculoskeletal  (+) Arthritis ,    Abdominal   Peds  Hematology  (+) Blood dyscrasia, anemia   Anesthesia Other Findings Reviewed and agree with Edd Fabian pre-anesthesia clinical review note.    Cardiology note 01/29/23:  77 y.o. male with  1. Coronary artery disease involving native coronary artery of native heart, unspecified whether angina present   2. Chronic Diastolic heart failure  Plan   I told the patient to be okay. Taking his middle dose of hydralazine as he misses his dose frequently. I think his blood pressure actually looks quite good on his modified regimen of labetalol and hydralazine. Volume status looks good today and does not need additional diuretics.   I ordered his labs today to keep an eye on his renal function. Also added a BNP.  From a heart failure standpoint he has preserved ejection fraction but likely has some component of aortic stenosis and mitral regurgitation but that may result in congestive heart failure symptoms. At this point in reviewing the echocardiogram they do not need any treatment thresholds but if medically difficult to manage we can talk about intervention.  From a cardiovascular standpoint he is doing okay for right now. He is on a good secondary prevention regimen and does not have any angina.  He is scheduled to have hernia repair. Based on his evaluation today I think it is okay him. To proceed with surgery with general anesthesia without any further cardiovascular testing. He remains at moderate risk for cardiovascular events but that is not likely to be not modified with any  other restratification or changes in medical therapy. I would recommend holding his antiplatelet therapy and DOAC 5 days prior to proceeding with surgery.   Return in about 6 months  (around 07/31/2023).    Reproductive/Obstetrics                             Anesthesia Physical Anesthesia Plan  ASA: 3  Anesthesia Plan: General   Post-op Pain Management:    Induction: Intravenous  PONV Risk Score and Plan: 2 and Ondansetron, Dexamethasone and Treatment may vary due to age or medical condition  Airway Management Planned: Oral ETT  Additional Equipment:   Intra-op Plan:   Post-operative Plan: Extubation in OR  Informed Consent: I have reviewed the patients History and Physical, chart, labs and discussed the procedure including the risks, benefits and alternatives for the proposed anesthesia with the patient or authorized representative who has indicated his/her understanding and acceptance.     Dental advisory given  Plan Discussed with: CRNA  Anesthesia Plan Comments: (Patient consented for risks of anesthesia including but not limited to:  - adverse reactions to medications - damage to eyes, teeth, lips or other oral mucosa - nerve damage due to positioning  - sore throat or hoarseness - damage to heart, brain, nerves, lungs, other parts of body or loss of life  Informed patient about role of CRNA in peri- and intra-operative care.  Patient voiced understanding.)        Anesthesia Quick Evaluation

## 2023-02-19 NOTE — Interval H&P Note (Signed)
History and Physical Interval Note:  02/19/2023 9:22 AM  [name]  has presented today for surgery, with the diagnosis of right inguinal hernia, reducible umbilical hernia less 3 cm.  The various methods of treatment have been discussed with the patient and family. After consideration of risks, benefits and other options for treatment, the patient has consented to  Procedure(s): XI ROBOTIC ASSISTED INGUINAL HERNIA (Right) HERNIA REPAIR UMBILICAL ADULT, open (N/A) as a surgical intervention.  The patient's history has been reviewed, patient examined, no change in status, stable for surgery.  I have reviewed the patient's chart and labs.  Questions were answered to the patient's satisfaction.     Freedom Peddy

## 2023-02-19 NOTE — Op Note (Signed)
Procedure Date:  02/19/2023  Pre-operative Diagnosis:  Right inguinal hernia, umbilical hernia  Post-operative Diagnosis: Right inguinal hernia, 2.5 cm umbilical hernia  Procedure: 1.  Robotic assisted right Inguinal Hernia Repair 2.  Creation of right Posterior Rectus-Transversalis Fascia Advancment Flap for Coverage of Pelvic Wound (200 cm) 3.  Open umbilical hernia repair.  Surgeon:  Howie Ill, MD  Anesthesia:  General endotracheal  Estimated Blood Loss:  25 ml  Specimens:  None  Complications:  None  Indications for Procedure:  This is a 77 y.o. male who presents with a right inguinal hernia and an umbilical hernia.  The options of surgery versus observation were reviewed with the patient and/or family. The risks of bleeding, abscess or infection, recurrence of symptoms, potential for an open procedure, injury to surrounding structures, and chronic pain were all discussed with the patient and he was willing to proceed.  We have planned this transabdominal procedure with the creation of right peritoneal flap based on the posterior rectus sheath and transversalis fascia in order to fully cover the mesh, creating a natural tisssue barrier for the bowel and peritoneal cavity.  Description of Procedure: The patient was correctly identified in the preoperative area and brought into the operating room.  The patient was placed supine with VTE prophylaxis in place.  Appropriate time-outs were performed.  Anesthesia was induced and the patient was intubated.  Foley catheter was placed.  Appropriate antibiotics were infused.  The abdomen was prepped and draped in a sterile fashion.  A supraumbilical incision was made and cautery was used to dissect down the subcutaneous tissue along the umbilical stalk.  Kelly forceps were used to dissect the umbilical stalk and it was divided at the fascia using cautery.  This allowed visualization of the hernia defect.  The hernia was reduced without  complications.  The fascial edges were cleaned using cautery.  The hernia defect measured 2.5 cm.  A 12 mm port was inserted.  Pneumoperitoneum was obtained with appropriate opening pressures.  A Veress needle was used to start dissecting the peritoneal flap.  Two 8-mm robotic ports were placed in the right and left lateral positions under direct visualization.  A large right Bard 3D Max Mid Mesh, a 2-0 Vicryl, and 2-0 vlock suture were placed through the umbilical port under direct visualization.  The [employer] platform was docked onto the patient, the camera was inserted and targeted, and the instruments were placed under direct visualization.  Both inguinal regions were inspected for hernias and it was confirmed that the patient had a right inguinal hernia.  Using electocautery, the peritoneal and posterior rectus tissue flap was created.  The peritoneum on the right side was scored from the median umbilical ligament laterally towards the ASIS.  The flap was mobilized using robotic scissors and the bipolar instruments, creating a plane along the posterior rectus sheath and transversalis fascia down to the pubic tubercle medially. It was then further mobilized laterally across the inguinal canal and femoral vessels and onto the psoas muscle. The inferior epigastric vessels were identified and preserved. This created a posterior rectus and peritoneal flap measuring roughly 17 cm x 12 cm.  The hernia sac and contents were reduced preserving all structures.  A large right Bard 3D Max Mid mesh was placed with good overlap along all the potential hernia defects and secured in place with 2-0 Vicryl along the medial superomedial and superolateral aspects.  There was mild oozing noted at the base of the dissection field.  Bipolar was used for hemostasis and 1 gm of Arista powder was sprayed over the area.  Then, the peritoneal flap was advanced over the mesh and carried over to close the defect. A running 2-0 V lock  suture was used to approximate the edge of the flap onto the peritoneum.  All needles were removed under direct visualization.  The 8- mm ports were removed under direct visualization and the Hasson trocar was removed.  An 8 cm hernia patch mesh was introduced via the defect and spread open.  The tails were secured to the fascia using 2-0 Prolene sutures.  The fascia was then closed with 0 Ethibond sutures, incorporating a layer of the mesh with each suture.  The umbilical stalk was then reattached to the fascia using 2-0 Vicryl. The wound was irrigated and local anesthetic was infused onto the fascia, port sites, and as a right ilioinguinal block.  The umbilical wound was then closed in layers using 3-0 Vicryl and 4-0 Monocryl.  The other port incisions were closed with 4-0 Monocryl.  The wounds were cleaned and sealed with DermaBond.  Foley catheter was removed and the patient was emerged from anesthesia and extubated and brought to the recovery room for further management.  The patient tolerated the procedure well and all counts were correct at the end of the case.   Howie Ill, MD

## 2023-02-19 NOTE — Anesthesia Procedure Notes (Signed)
Procedure Name: Intubation Date/Time: 02/19/2023 9:54 AM  Performed by: Merlene Pulling, CRNAPre-anesthesia Checklist: Patient identified, Patient being monitored, Timeout performed, Emergency Drugs available and Suction available Patient Re-evaluated:Patient Re-evaluated prior to induction Oxygen Delivery Method: Circle system utilized Preoxygenation: Pre-oxygenation with 100% oxygen Induction Type: IV induction Ventilation: Mask ventilation without difficulty Laryngoscope Size: McGraph and 4 Grade View: Grade I Tube type: Oral Tube size: 7.5 mm Number of attempts: 1 Airway Equipment and Method: Stylet Placement Confirmation: ETT inserted through vocal cords under direct vision, positive ETCO2 and breath sounds checked- equal and bilateral Secured at: 22 cm Tube secured with: Tape Dental Injury: Teeth and Oropharynx as per pre-operative assessment

## 2023-02-20 ENCOUNTER — Encounter: Payer: Self-pay | Admitting: Surgery

## 2023-03-06 ENCOUNTER — Encounter: Payer: Self-pay | Admitting: Surgery

## 2023-03-06 ENCOUNTER — Ambulatory Visit: Payer: Medicare HMO | Admitting: Surgery

## 2023-03-06 VITALS — BP 138/59 | HR 66 | Temp 98.1°F | Ht 73.0 in | Wt 202.0 lb

## 2023-03-06 DIAGNOSIS — K429 Umbilical hernia without obstruction or gangrene: Secondary | ICD-10-CM

## 2023-03-06 DIAGNOSIS — Z09 Encounter for follow-up examination after completed treatment for conditions other than malignant neoplasm: Secondary | ICD-10-CM

## 2023-03-06 DIAGNOSIS — K4090 Unilateral inguinal hernia, without obstruction or gangrene, not specified as recurrent: Secondary | ICD-10-CM

## 2023-03-06 NOTE — Progress Notes (Signed)
03/06/2023  HPI: RANEY KOEPPEN is a 77 y.o. male s/p robotic assisted right inguinal hernia repair and open umbilical hernia repair with mesh on 02/19/23.  Presents today for follow up.  Denies any worsening pain and reports he's doing well.  No troubles with the incisions.  Vital signs: BP (!) 138/59   Pulse 66   Temp 98.1 F (36.7 C) (Oral)   Ht 6\' 1"  (1.854 m)   Wt 202 lb (91.6 kg)   SpO2 95%   BMI 26.65 kg/m    Physical Exam: Constitutional: No acute distress Abdomen:  soft, non-distended, non-tender to palpation.  Incisions are healing well and are clean, dry, intact.  Umbilical site without evidence of hernia recurrence, with palpable firmness consistent with surgical scarring/changes.  Right groin without hernia recurrence, with some post-op swelling and small seroma.  Assessment/Plan: This is a 77 y.o. male s/p robotic assisted right inguinal hernia repair and open umbilical hernia repair with mesh.  --Patient is healing well, without complications at this point.  Discussed with him that the firmness/swelling at the hernia sites will continue to improve on its own.  The small seroma may or may not resolve depending on how the surrounding tissue heals, but for now it's not causing any problems. --Follow up as needed.   Howie Ill, MD [facility]

## 2023-03-06 NOTE — Patient Instructions (Signed)

## 2023-04-22 ENCOUNTER — Other Ambulatory Visit: Payer: Medicare HMO

## 2023-04-22 ENCOUNTER — Ambulatory Visit: Payer: Medicare HMO | Admitting: Oncology

## 2023-04-23 ENCOUNTER — Encounter: Payer: Self-pay | Admitting: Oncology

## 2023-04-23 ENCOUNTER — Inpatient Hospital Stay: Payer: Medicare HMO | Admitting: Oncology

## 2023-04-23 ENCOUNTER — Inpatient Hospital Stay: Payer: Medicare HMO | Attending: Oncology

## 2023-04-23 VITALS — BP 160/68 | HR 54 | Temp 96.0°F | Resp 18 | Wt 198.8 lb

## 2023-04-23 DIAGNOSIS — D649 Anemia, unspecified: Secondary | ICD-10-CM | POA: Diagnosis not present

## 2023-04-23 DIAGNOSIS — Z87891 Personal history of nicotine dependence: Secondary | ICD-10-CM | POA: Insufficient documentation

## 2023-04-23 DIAGNOSIS — D509 Iron deficiency anemia, unspecified: Secondary | ICD-10-CM | POA: Diagnosis present

## 2023-04-23 DIAGNOSIS — D508 Other iron deficiency anemias: Secondary | ICD-10-CM

## 2023-04-23 LAB — CBC WITH DIFFERENTIAL/PLATELET
Abs Immature Granulocytes: 0.01 10*3/uL (ref 0.00–0.07)
Basophils Absolute: 0.1 10*3/uL (ref 0.0–0.1)
Basophils Relative: 1 %
Eosinophils Absolute: 0.1 10*3/uL (ref 0.0–0.5)
Eosinophils Relative: 2 %
HCT: 31.6 % — ABNORMAL LOW (ref 39.0–52.0)
Hemoglobin: 9.9 g/dL — ABNORMAL LOW (ref 13.0–17.0)
Immature Granulocytes: 0 %
Lymphocytes Relative: 10 %
Lymphs Abs: 0.5 10*3/uL — ABNORMAL LOW (ref 0.7–4.0)
MCH: 31.5 pg (ref 26.0–34.0)
MCHC: 31.3 g/dL (ref 30.0–36.0)
MCV: 100.6 fL — ABNORMAL HIGH (ref 80.0–100.0)
Monocytes Absolute: 0.4 10*3/uL (ref 0.1–1.0)
Monocytes Relative: 8 %
Neutro Abs: 4.2 10*3/uL (ref 1.7–7.7)
Neutrophils Relative %: 79 %
Platelets: 118 10*3/uL — ABNORMAL LOW (ref 150–400)
RBC: 3.14 MIL/uL — ABNORMAL LOW (ref 4.22–5.81)
RDW: 16.8 % — ABNORMAL HIGH (ref 11.5–15.5)
WBC: 5.4 10*3/uL (ref 4.0–10.5)
nRBC: 0 % (ref 0.0–0.2)

## 2023-04-23 LAB — FERRITIN: Ferritin: 116 ng/mL (ref 24–336)

## 2023-04-23 LAB — VITAMIN B12: Vitamin B-12: 272 pg/mL (ref 180–914)

## 2023-04-23 LAB — IRON AND TIBC
Iron: 88 ug/dL (ref 45–182)
Saturation Ratios: 27 % (ref 17.9–39.5)
TIBC: 328 ug/dL (ref 250–450)
UIBC: 240 ug/dL

## 2023-04-23 NOTE — Progress Notes (Signed)
Hematology/Oncology Consult note [facility]  [phone] [phone]  Patient Care Team: Jaclyn Shaggy, MD as PCP - General (Internal Medicine) Karie Soda, MD as Consulting Physician (General Surgery) Pasty Spillers, MD (Inactive) as Consulting Physician (Gastroenterology) Alwyn Pea, MD as Consulting Physician (Cardiology)   Name of the patient: [name]  [id number]  1946/05/29   Date of visit: 04/23/23  Diagnosis-anemia multifactorial secondary to chronic disease as well as iron deficiency  Chief complaint/ Reason for visit-routine follow-up of anemia  Heme/Onc history: Patient is a 77 year old male with history of multiple gastric polyps who was seen by both Dr. Meridee Score and Dr. Maximino Greenland.  He has undergone EGD with Dr. Meridee Score and had a large gastric hyperplastic polyp removal.  Prior to that patient had significant iron deficiency and his aspirin and Plavix were on hold.  These were restarted after EGD.  Most recent CBC on 11/01/2020 showed white count of 6.8, H&H of 11.3/36.4 with an MCV of 85 and a platelet count of 158.  Iron studies were normal.  Patient denies any bleeding in his stool or dark melanotic stools.   Patient received 5 doses of Venofer in October 2022 and his hemoglobin improved from 7-12  Interval history-patient recently underwent inguinal hernia surgery.  He is doing well overall.  Denies any blood loss in his stool or urine.  ECOG PS- 1 Pain scale- 0   Review of systems- Review of Systems  Constitutional:  Negative for chills, fever, malaise/fatigue and weight loss.  HENT:  Negative for congestion, ear discharge and nosebleeds.   Eyes:  Negative for blurred vision.  Respiratory:  Negative for cough, hemoptysis, sputum production, shortness of breath and wheezing.   Cardiovascular:  Negative for chest pain, palpitations, orthopnea and claudication.  Gastrointestinal:  Negative for  abdominal pain, blood in stool, constipation, diarrhea, heartburn, melena, nausea and vomiting.  Genitourinary:  Negative for dysuria, flank pain, frequency, hematuria and urgency.  Musculoskeletal:  Negative for back pain, joint pain and myalgias.  Skin:  Negative for rash.  Neurological:  Negative for dizziness, tingling, focal weakness, seizures, weakness and headaches.  Endo/Heme/Allergies:  Does not bruise/bleed easily.  Psychiatric/Behavioral:  Negative for depression and suicidal ideas. The patient does not have insomnia.       Allergies  Allergen Reactions   Isosorbide Cough     Past Medical History:  Diagnosis Date   (HFpEF) heart failure with preserved ejection fraction (HCC) 07/25/2016   a.)TTE 07/25/16: EF >55, triv-mild pan regur, mild AS, G2DD; b.)TTE 09/02/16: EF >55, triv TR, mild AS; c.)TTE 11/05/16: EF >55, LAE, triv-mild pan regur, mild AS, G2DD; d.)TTE 01/21/20: EF >55, mod LVH, LAE/RVE, triv-mild AR/PR/TR, mod MR, mild AS, G2DD; e.)TTE 09/24/22: EF>55, sev LVH, sev LAE, RAE, mild PR, mod AR/MR/TR, mild-mod AS, RVSP 74.7; f.)TTE 12/23/22: EF 60-65, LAE, mild-mod MR/TR, mild AS   Anemia    Aortic atherosclerosis (HCC)    Aortic stenosis 07/25/2016   a.) TTE 07/25/2016: mild (MPG 13.5); b.) TTE 09/02/2016: mild (MPG 16); c.) TTE 11/05/2016: mild (MPG 9); d.) TTE 01/21/2020: mild (MPG 11.7); e.) TTE 09/24/2022: mild-mod (MPG 20.3); f.) TTE 12/23/2022: mild (MPG 15)   Arthritis    Atrial fibrillation (HCC)    a.) CHA2DS2VASc = 6 (age x2, CHF, HTN, vascular disease history, T2DM);  b.) rate/rhythm maintained on oral labetalol; chronically anticoagulated with apixaban + clopidogrel   AV block, 1st degree    B12 deficiency  CKD (chronic kidney disease), stage III (HCC)    Colon polyps    Coronary artery disease 08/16/2016   a.) s/p 3v CABG 08/30/2016; b.) s/p PCI 11/26/2016 (DES x 1 oSVG-RCA); c.) s/p PCI 12/17/2016 (DES x 5 --> mLCx x2, dLCx, pLAD, dLAD); c.) s/p PCI  06/09/2020 (DES x1 --> ISR oSVG-PDA)   Dyspnea    GERD (gastroesophageal reflux disease)    Hepatic steatosis    History of 2019 novel coronavirus disease (COVID-19) 09/02/2020   a.) Tx'd with remdesivir   History of blood transfusion    History of GI bleed 09/02/2020   a.) admitted to [facility] 09/02/2020 - 09/08/2020   History of heart artery stent 11/26/2016   TOTAL of 7 stents: a.) 11/26/2016 --> 3.5 x 22 mm Resolute Integrity oSVG-RCA; b.) 12/17/2016: 3.0 x 12 mm Xience Alpine dLCx, 3.5 x 22 mm Resolute Onyx mLCx, 3.5 x 18 mm Resolute Onyx mLCx, 3.0 x 26 mm Resolute Onyx pLAD, 2.0 x 26 mm Resolute Onyx dLAD; c.) 06/09/2020 --> 2.5 x 18 mm Resolute Onyx ISR oSVG-PDA   History of partial colectomy 08/12/2012   a.) large gastric hyperplastic polyp removal   HLD (hyperlipidemia)    Hypertension    LBBB (left bundle branch block)    Long term current use of anticoagulant    a.) apixaban   Long term current use of antithrombotics/antiplatelets    a.) clopidogrel   Male hypogonadism    a.) on exogenous TRT (1.62% gel)   Mild cardiomegaly    Mild gynecomastia (bilateral)    Nephrolithiasis    OSA (obstructive sleep apnea)    a.) does not utilize nocturnal PAP therapy   Pulmonary hypertension (HCC) 09/24/2022   a.) TTE 09/24/2022: RVSP 74.7; b.) TTE 12/23/2022: RVSP 31.3   RBBB (right bundle branch block with left anterior fascicular block)    Right inguinal hernia    Right thyroid nodule    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   a.) LIMA-LAD, SVG-PDA, SVG-OM3   Type 2 diabetes mellitus treated with insulin (HCC)    Umbilical hernia    Unstable angina (HCC)    Vitamin D deficiency      Past Surgical History:  Procedure Laterality Date   APPENDECTOMY     CARDIAC CATHETERIZATION Left 08/16/2016   Procedure: Left Heart Cath and Coronary Angiography;  Surgeon: Alwyn Pea, MD;  Location: [facility] INVASIVE CV [facility];  Service: Cardiovascular;  Laterality: Left;   COLONOSCOPY N/A 09/07/2020    Procedure: COLONOSCOPY;  Surgeon: Pasty Spillers, MD;  Location: [facility] ENDOSCOPY;  Service: Endoscopy;  Laterality: N/A;   COLONOSCOPY WITH PROPOFOL N/A 05/17/2021   Procedure: COLONOSCOPY WITH PROPOFOL;  Surgeon: Pasty Spillers, MD;  Location: [facility] ENDOSCOPY;  Service: Endoscopy;  Laterality: N/A;   CORONARY ANGIOPLASTY WITH STENT PLACEMENT Left 11/26/2016   Procedure: CORONARY ANGIOPLASTY WITH STENT PLACEMENT; Location: [facility]; Surgeon: Jerrell Mylar, MD   CORONARY ARTERY BYPASS GRAFT N/A 08/30/2016   Procedure: CORONARY ARTERY BYPASS GRAFT; Location: [facility]; Surgeon: Nance Pew, MD   CORONARY STENT INTERVENTION N/A 06/09/2020   Procedure: CORONARY STENT INTERVENTION;  Surgeon: Alwyn Pea, MD;  Location: [facility];  Service: Cardiovascular;  Laterality: N/A;   CORONARY STENT INTERVENTION Left 12/17/2016   Procedure: CORONARY STENT INTERVENTION; Location: [facility]; Surgeon: Jerrell Mylar, MD   ENDOSCOPIC MUCOSAL RESECTION N/A 09/22/2020   Procedure: ENDOSCOPIC MUCOSAL RESECTION;  Surgeon: Lemar Lofty., MD;  Location[facility] END[facility]COPY;  Service: Gastroenterology;  Laterality: N/A;   ESOPHAGOGASTRODUODENOSCOPY  N/A 09/07/2020   Procedure: ESOPHAGOGASTRODUODENOSCOPY (EGD);  Surgeon: Pasty Spillers, MD;  Location: [facility] ENDOSCOPY;  Service: Endoscopy;  Laterality: N/A;   ESOPHAGOGASTRODUODENOSCOPY (EGD) WITH PROPOFOL N/A 09/22/2020   Procedure: ESOPHAGOGASTRODUODENOSCOPY (EGD) WITH PROPOFOL;  Surgeon: Meridee Score Netty Starring., MD;  Location: [facility] ENDOSCOPY;  Service: Gastroenterology;  Laterality: N/A;   FRACTURE SURGERY     HEMOSTASIS CLIP PLACEMENT  09/22/2020   Procedure: HEMOSTASIS CLIP PLACEMENT;  Surgeon: Meridee Score Netty Starring., MD;  Location: [facility] ENDOSCOPY;  Service: Gastroenterology;;   HEMOSTASIS CONTROL  09/22/2020   Procedure: HEMOSTASIS CONTROL;  Surgeon: Lemar Lofty., MD;  Location: [facility] ENDOSCOPY;  Service: Gastroenterology;;   INSERTION OF MESH   02/19/2023   Procedure: INSERTION OF MESH;  Surgeon: Henrene Dodge, MD;  Location: [facility] ORS;  Service: General;;  umbilical   LAPAROSCOPIC PARTIAL RIGHT COLECTOMY Right 08/12/2012   Procedure: LAPAROSCOPIC PARTIAL RIGHT COLECTOMY; Location: [facility]; Surgeon: Renda Rolls, MD   LEFT HEART CATH AND CORONARY ANGIOGRAPHY Left 06/09/2020   Procedure: LEFT HEART CATH AND CORONARY ANGIOGRAPHY;  Surgeon: Alwyn Pea, MD;  Location: [facility];  Service: Cardiovascular;  Laterality: Left;   LEFT HEART CATH AND CORS/GRAFTS ANGIOGRAPHY Left 11/20/2016   Procedure: Left Heart Cath and Cors/Grafts Angiography;  Surgeon: Alwyn Pea, MD;  Location: [facility];  Service: Cardiovascular;  Laterality: Left;   POLYPECTOMY  09/22/2020   Procedure: POLYPECTOMY;  Surgeon: Mansouraty, Netty Starring., MD;  Location: [facility] ENDOSCOPY;  Service: Gastroenterology;;   SUBMUCOSAL LIFTING INJECTION  09/22/2020   Procedure: SUBMUCOSAL LIFTING INJECTION;  Surgeon: Lemar Lofty., MD;  Location: [facility] ENDOSCOPY;  Service: Gastroenterology;;   UMBILICAL HERNIA REPAIR N/A 02/19/2023   Procedure: HERNIA REPAIR UMBILICAL ADULT, open;  Surgeon: Henrene Dodge, MD;  Location: [facility] ORS;  Service: General;  Laterality: N/A;   XI ROBOTIC ASSISTED INGUINAL HERNIA REPAIR WITH MESH Right 02/19/2023   Procedure: XI ROBOTIC ASSISTED INGUINAL HERNIA REPAIR WITH MESH;  Surgeon: Henrene Dodge, MD;  Location: [facility] ORS;  Service: General;  Laterality: Right;    Social History   Socioeconomic History   Marital status: Married    Spouse name: Not on file   Number of children: Not on file   Years of education: Not on file   Highest education level: Not on file  Occupational History   Not on file  Tobacco Use   Smoking status: Former    Types: Cigarettes    Passive exposure: Never   Smokeless tobacco: Never  Vaping Use   Vaping status: Never Used  Substance and Sexual Activity   Alcohol use: No   Drug use: No    Sexual activity: Not Currently  Other Topics Concern   Not on file  Social History Narrative   Not on file   Social Determinants of Health   Financial Resource Strain: Not on file  Food Insecurity: No Food Insecurity (12/26/2022)   Hunger Vital Sign    Worried About Running Out of Food in the Last Year: Never true    Ran Out of Food in the Last Year: Never true  Transportation Needs: No Transportation Needs (12/26/2022)   PRAPARE - Administrator, Civil Service (Medical): No    Lack of Transportation (Non-Medical): No  Physical Activity: Not on file  Stress: Not on file  Social Connections: Not on file  Intimate Partner Violence: Not At Risk (12/26/2022)   Humiliation, Afraid, Rape, and Kick questionnaire    Fear of Current or Ex-Partner: No  Emotionally Abused: No    Physically Abused: No    Sexually Abused: No    History reviewed. No pertinent family history.   Current Outpatient Medications:    acetaminophen (TYLENOL) 500 MG tablet, Take 2 tablets (1,000 mg total) by mouth every 6 (six) hours as needed for mild pain., Disp: , Rfl:    apixaban (ELIQUIS) 5 MG TABS tablet, Take 1 tablet (5 mg total) by mouth 2 (two) times daily., Disp: 60 tablet, Rfl: 0   cholecalciferol (CHOLECALCIFEROL) 25 MCG tablet, Take 1 tablet (1,000 Units total) by mouth daily., Disp: 30 tablet, Rfl: 0   clopidogrel (PLAVIX) 75 MG tablet, Take 75 mg by mouth daily., Disp: , Rfl:    dexlansoprazole (DEXILANT) 60 MG capsule, Take 1 capsule by mouth daily., Disp: , Rfl:    Ferrous Sulfate (IRON) 325 (65 Fe) MG TABS, Take by mouth., Disp: , Rfl:    hydrALAZINE (APRESOLINE) 50 MG tablet, Take 1 tablet by mouth 3 (three) times daily., Disp: , Rfl:    insulin glargine (LANTUS) 100 UNIT/ML injection, Inject 25 Units into the skin at bedtime. , Disp: , Rfl:    labetalol (NORMODYNE) 100 MG tablet, Take 1 tablet by mouth 2 (two) times daily., Disp: , Rfl:    metFORMIN (GLUCOPHAGE-XR) 500 MG 24 hr  tablet, Take 500 mg by mouth 2 (two) times daily., Disp: , Rfl:    rosuvastatin (CRESTOR) 40 MG tablet, Take 40 mg by mouth daily., Disp: , Rfl:    sitaGLIPtin (JANUVIA) 100 MG tablet, Take 100 mg by mouth daily., Disp: , Rfl:    Testosterone 1.62 % GEL, Apply 2 Pump topically daily. One pump on each arm, Disp: 225 g, Rfl: 3   torsemide (DEMADEX) 20 MG tablet, Take 1 tablet (20 mg total) by mouth 2 (two) times daily., Disp: , Rfl:    spironolactone (ALDACTONE) 25 MG tablet, Take 1 tablet (25 mg total) by mouth daily. (Patient not taking: Reported on 04/23/2023), Disp: 30 tablet, Rfl: 0  Physical exam:  Vitals:   04/23/23 1100  BP: (!) 160/68  Pulse: (!) 54  Resp: 18  Temp: (!) 96 F (35.6 C)  TempSrc: Tympanic  SpO2: 99%  Weight: 198 lb 12.8 oz (90.2 kg)   Physical Exam Cardiovascular:     Rate and Rhythm: Normal rate and regular rhythm.     Heart sounds: Normal heart sounds.  Pulmonary:     Effort: Pulmonary effort is normal.     Breath sounds: Normal breath sounds.  Skin:    General: Skin is warm and dry.  Neurological:     Mental Status: He is alert and oriented to person, place, and time.         Latest Ref Rng & Units 12/27/2022    5:14 AM  CMP  Glucose 70 - 99 mg/dL 782   BUN 8 - 23 mg/dL 50   Creatinine 9.56 - 1.24 mg/dL 2.13   Sodium 086 - 578 mmol/L 133   Potassium 3.5 - 5.1 mmol/L 4.0   Chloride 98 - 111 mmol/L 89   CO2 22 - 32 mmol/L 33   Calcium 8.9 - 10.3 mg/dL 46.9       Latest Ref Rng & Units 04/23/2023   10:40 AM  CBC  WBC 4.0 - 10.5 K/uL 5.4   Hemoglobin 13.0 - 17.0 g/dL 9.9   Hematocrit 62.9 - 52.0 % 31.6   Platelets 150 - 400 K/uL 118      Assessment and  plan- Patient is a 77 y.o. male here for routine follow-up of Normocytic anemia  Patient's baseline hemoglobin runs around 11 and is currently dropped to 9.9 which could be secondary to his recent inguinal hernia surgery.  Ferritin levels are normal at 116 with an iron saturation of 27%.  He  is therefore not iron deficient.  B12 levels are pending.  I am inclined to monitor his hemoglobin conservatively with labs in 3 and 6 months and I will see him back in 6 months   Visit Diagnosis 1. Normocytic anemia      Dr. Owens Shark, MD, MPH [facility] [phone] 04/23/2023 3:01 PM

## 2023-04-25 ENCOUNTER — Telehealth: Payer: Self-pay | Admitting: *Deleted

## 2023-04-25 NOTE — Telephone Encounter (Signed)
Dr. Smith Robert had wanted to call and tell the patient that he needs to be starting on B12 tablets every day.  She told me to let the patient know that the B12 level that we did a couple days ago showed the number was 272 and she would like to have much higher.  Would him that he can get the B12 tablets anywhere drugstore [employer] grocery store does not matter.  He will look for B12 and it is 1000 mcg so its MCG and he would take 1 a day.  Asked him to call me back so I can know if he got my message and he is willing to take the pills Phone number [phone]

## 2023-05-01 DIAGNOSIS — I509 Heart failure, unspecified: Secondary | ICD-10-CM | POA: Insufficient documentation

## 2023-05-01 DIAGNOSIS — E1122 Type 2 diabetes mellitus with diabetic chronic kidney disease: Secondary | ICD-10-CM | POA: Insufficient documentation

## 2023-05-01 DIAGNOSIS — N1832 Chronic kidney disease, stage 3b: Secondary | ICD-10-CM | POA: Insufficient documentation

## 2023-05-01 DIAGNOSIS — R8290 Unspecified abnormal findings in urine: Secondary | ICD-10-CM

## 2023-05-01 DIAGNOSIS — Z9889 Other specified postprocedural states: Secondary | ICD-10-CM

## 2023-05-01 DIAGNOSIS — K219 Gastro-esophageal reflux disease without esophagitis: Secondary | ICD-10-CM | POA: Insufficient documentation

## 2023-05-01 HISTORY — DX: Chronic kidney disease, stage 3b: N18.32

## 2023-05-01 HISTORY — DX: Type 2 diabetes mellitus with diabetic chronic kidney disease: E11.22

## 2023-05-01 HISTORY — DX: Unspecified abnormal findings in urine: R82.90

## 2023-05-01 HISTORY — DX: Other specified postprocedural states: Z98.890

## 2023-05-01 HISTORY — DX: Gastro-esophageal reflux disease without esophagitis: K21.9

## 2023-05-01 HISTORY — DX: Heart failure, unspecified: I50.9

## 2023-05-03 ENCOUNTER — Other Ambulatory Visit: Payer: Self-pay | Admitting: Nephrology

## 2023-05-03 DIAGNOSIS — E1122 Type 2 diabetes mellitus with diabetic chronic kidney disease: Secondary | ICD-10-CM

## 2023-05-03 DIAGNOSIS — R8290 Unspecified abnormal findings in urine: Secondary | ICD-10-CM

## 2023-05-03 DIAGNOSIS — N1832 Chronic kidney disease, stage 3b: Secondary | ICD-10-CM

## 2023-05-03 DIAGNOSIS — D631 Anemia in chronic kidney disease: Secondary | ICD-10-CM

## 2023-05-08 ENCOUNTER — Telehealth: Payer: Self-pay | Admitting: *Deleted

## 2023-05-08 NOTE — Telephone Encounter (Signed)
Called again and left message that I reached out to him about 2 weeks ago about b12 tablets to take to help with the low level of b12. I left the message that he can get it over the counter and it is 1000 mcg every day. I left my number to let me know how it is going or not.

## 2023-05-14 ENCOUNTER — Ambulatory Visit
Admission: RE | Admit: 2023-05-14 | Discharge: 2023-05-14 | Disposition: A | Discharge status: Home / Self Care | Payer: Medicare HMO | Source: Ambulatory Visit | Attending: Nephrology | Admitting: Nephrology

## 2023-05-14 DIAGNOSIS — R8290 Unspecified abnormal findings in urine: Secondary | ICD-10-CM | POA: Insufficient documentation

## 2023-05-14 DIAGNOSIS — E1122 Type 2 diabetes mellitus with diabetic chronic kidney disease: Secondary | ICD-10-CM | POA: Insufficient documentation

## 2023-05-14 DIAGNOSIS — N1832 Chronic kidney disease, stage 3b: Secondary | ICD-10-CM | POA: Diagnosis present

## 2023-05-14 DIAGNOSIS — D631 Anemia in chronic kidney disease: Secondary | ICD-10-CM | POA: Insufficient documentation

## 2023-07-02 ENCOUNTER — Telehealth: Payer: Self-pay

## 2023-07-02 NOTE — Telephone Encounter (Signed)
Patient called in to make appointment because he is having gas and keep a bad taste in his mouth. The patient stated that he has been having this problem for a couple of months. He was a patient of Dr. Maximino Greenland and he is schedule with PA -Gerre Pebbles on  08/01/23 at 10:15 am.

## 2023-07-03 DIAGNOSIS — N281 Cyst of kidney, acquired: Secondary | ICD-10-CM | POA: Insufficient documentation

## 2023-07-03 HISTORY — DX: Cyst of kidney, acquired: N28.1

## 2023-07-17 ENCOUNTER — Telehealth: Payer: Self-pay | Admitting: Urology

## 2023-07-17 ENCOUNTER — Other Ambulatory Visit: Payer: Self-pay | Admitting: Urology

## 2023-07-17 DIAGNOSIS — E349 Endocrine disorder, unspecified: Secondary | ICD-10-CM

## 2023-07-17 DIAGNOSIS — N138 Other obstructive and reflux uropathy: Secondary | ICD-10-CM

## 2023-07-17 DIAGNOSIS — E291 Testicular hypofunction: Secondary | ICD-10-CM

## 2023-07-17 NOTE — Telephone Encounter (Signed)
Pt has appt w/you in [place] on Monday 12/9.  He wants you to put orders in for labs so he can go either tomorrow or Friday.

## 2023-07-17 NOTE — Telephone Encounter (Signed)
I put in his orders.

## 2023-07-17 NOTE — Progress Notes (Signed)
07/22/23 10:33 AM   [name] 01-Jul-1946 [id number]  Referring provider:  Jaclyn Shaggy, MD [address]   [place],  [place] [address]  Urological history  1. Testosterone deficiency -contributing factors of age, obesity, diabetes and CKD -testosterone level (07/2023) 464 -Hemoglobin/hematocrit (07/2023) 8.9/27.7 -testosterone gel 1.62%, 2 pumps daily   2. BPH with LU TS -PSA (07/2023) 0.28   3. ED -contributing factors of age, BPH, testosterone deficiency, DM, HTN, HLD, sleep apnea, CHF and CAD  4. Sleep apnea -untreated    5. Urethroplasty as a child   Chief Complaint  Patient presents with   Hypogonadism    HPI: [name] is a 77 y.o.male who presents today for a 6 month follow-up.     Previous records reviewed.   He is applying his testosterone gel, 2 pumps every other day.  He had a RUS in October for chronic renal disease through his nephrologist.  Multiple cysts are noted bilaterally, but no hydronephrosis.  I PSS 13/3  He has to take a diuretic, so he has to urinate frequently.   This is all causes him to be mixed about his urination.  Patient denies any modifying or aggravating factors.  Patient denies any recent UTI's, gross hematuria, dysuria or suprapubic/flank pain.  Patient denies any fevers, chills, nausea or vomiting.     IPSS     Row Name 07/22/23 0900         International Prostate Symptom Score   How often have you had the sensation of not emptying your bladder? Less than 1 in 5     How often have you had to urinate less than every two hours? About half the time     How often have you found you stopped and started again several times when you urinated? Less than 1 in 5 times     How often have you found it difficult to postpone urination? Less than 1 in 5 times     How often have you had a weak urinary stream? Less than half the time     How often have you had to strain to start urination? Less than 1 in 5 times     How  many times did you typically get up at night to urinate? 4 Times     Total IPSS Score 13       Quality of Life due to urinary symptoms   If you were to spend the rest of your life with your urinary condition just the way it is now how would you feel about that? Mixed               Score:  1-7 Mild 8-19 Moderate 20-35 Severe   PMH: Past Medical History:  Diagnosis Date   (HFpEF) heart failure with preserved ejection fraction (HCC) 07/25/2016   a.)TTE 07/25/16: EF >55, triv-mild pan regur, mild AS, G2DD; b.)TTE 09/02/16: EF >55, triv TR, mild AS; c.)TTE 11/05/16: EF >55, LAE, triv-mild pan regur, mild AS, G2DD; d.)TTE 01/21/20: EF >55, mod LVH, LAE/RVE, triv-mild AR/PR/TR, mod MR, mild AS, G2DD; e.)TTE 09/24/22: EF>55, sev LVH, sev LAE, RAE, mild PR, mod AR/MR/TR, mild-mod AS, RVSP 74.7; f.)TTE 12/23/22: EF 60-65, LAE, mild-mod MR/TR, mild AS   Anemia    Aortic atherosclerosis (HCC)    Aortic stenosis 07/25/2016   a.) TTE 07/25/2016: mild (MPG 13.5); b.) TTE 09/02/2016: mild (MPG 16); c.) TTE 11/05/2016: mild (MPG 9); d.) TTE 01/21/2020: mild (MPG  11.7); e.) TTE 09/24/2022: mild-mod (MPG 20.3); f.) TTE 12/23/2022: mild (MPG 15)   Arthritis    Atrial fibrillation (HCC)    a.) CHA2DS2VASc = 6 (age x2, CHF, HTN, vascular disease history, T2DM);  b.) rate/rhythm maintained on oral labetalol; chronically anticoagulated with apixaban + clopidogrel   AV block, 1st degree    B12 deficiency    CKD (chronic kidney disease), stage III (HCC)    Colon polyps    Coronary artery disease 08/16/2016   a.) s/p 3v CABG 08/30/2016; b.) s/p PCI 11/26/2016 (DES x 1 oSVG-RCA); c.) s/p PCI 12/17/2016 (DES x 5 --> mLCx x2, dLCx, pLAD, dLAD); c.) s/p PCI 06/09/2020 (DES x1 --> ISR oSVG-PDA)   Dyspnea    GERD (gastroesophageal reflux disease)    Hepatic steatosis    History of 2019 novel coronavirus disease (COVID-19) 09/02/2020   a.) Tx'd with remdesivir   History of blood transfusion    History of GI  bleed 09/02/2020   a.) admitted to [facility] 09/02/2020 - 09/08/2020   History of heart artery stent 11/26/2016   TOTAL of 7 stents: a.) 11/26/2016 --> 3.5 x 22 mm Resolute Integrity oSVG-RCA; b.) 12/17/2016: 3.0 x 12 mm Xience Alpine dLCx, 3.5 x 22 mm Resolute Onyx mLCx, 3.5 x 18 mm Resolute Onyx mLCx, 3.0 x 26 mm Resolute Onyx pLAD, 2.0 x 26 mm Resolute Onyx dLAD; c.) 06/09/2020 --> 2.5 x 18 mm Resolute Onyx ISR oSVG-PDA   History of partial colectomy 08/12/2012   a.) large gastric hyperplastic polyp removal   HLD (hyperlipidemia)    Hypertension    LBBB (left bundle branch block)    Long term current use of anticoagulant    a.) apixaban   Long term current use of antithrombotics/antiplatelets    a.) clopidogrel   Male hypogonadism    a.) on exogenous TRT (1.62% gel)   Mild cardiomegaly    Mild gynecomastia (bilateral)    Nephrolithiasis    OSA (obstructive sleep apnea)    a.) does not utilize nocturnal PAP therapy   Pulmonary hypertension (HCC) 09/24/2022   a.) TTE 09/24/2022: RVSP 74.7; b.) TTE 12/23/2022: RVSP 31.3   RBBB (right bundle branch block with left anterior fascicular block)    Right inguinal hernia    Right thyroid nodule    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   a.) LIMA-LAD, SVG-PDA, SVG-OM3   Type 2 diabetes mellitus treated with insulin (HCC)    Umbilical hernia    Unstable angina (HCC)    Vitamin D deficiency     Surgical History: Past Surgical History:  Procedure Laterality Date   APPENDECTOMY     CARDIAC CATHETERIZATION Left 08/16/2016   Procedure: Left Heart Cath and Coronary Angiography;  Surgeon: Alwyn Pea, MD;  Location: [facility];  Service: Cardiovascular;  Laterality: Left;   COLONOSCOPY N/A 09/07/2020   Procedure: COLONOSCOPY;  Surgeon: Pasty Spillers, MD;  Location: [facility] ENDOSCOPY;  Service: Endoscopy;  Laterality: N/A;   COLONOSCOPY WITH PROPOFOL N/A 05/17/2021   Procedure: COLONOSCOPY WITH PROPOFOL;  Surgeon: Pasty Spillers, MD;   Location: [facility] ENDOSCOPY;  Service: Endoscopy;  Laterality: N/A;   CORONARY ANGIOPLASTY WITH STENT PLACEMENT Left 11/26/2016   Procedure: CORONARY ANGIOPLASTY WITH STENT PLACEMENT; Location: [facility]; Surgeon: Jerrell Mylar, MD   CORONARY ARTERY BYPASS GRAFT N/A 08/30/2016   Procedure: CORONARY ARTERY BYPASS GRAFT; Location: [facility]; Surgeon: Nance Pew, MD   CORONARY STENT INTERVENTION N/A 06/09/2020   Procedure: CORONARY STENT INTERVENTION;  Surgeon: Dorothyann Peng  D, MD;  Location: [facility];  Service: Cardiovascular;  Laterality: N/A;   CORONARY STENT INTERVENTION Left 12/17/2016   Procedure: CORONARY STENT INTERVENTION; Location: [facility]; Surgeon: Jerrell Mylar, MD   ENDOSCOPIC MUCOSAL RESECTION N/A 09/22/2020   Procedure: ENDOSCOPIC MUCOSAL RESECTION;  Surgeon: Lemar Lofty., MD;  Location: [facility] ENDOSCOPY;  Service: Gastroenterology;  Laterality: N/A;   ESOPHAGOGASTRODUODENOSCOPY N/A 09/07/2020   Procedure: ESOPHAGOGASTRODUODENOSCOPY (EGD);  Surgeon: Pasty Spillers, MD;  Location: [facility] ENDOSCOPY;  Service: Endoscopy;  Laterality: N/A;   ESOPHAGOGASTRODUODENOSCOPY (EGD) WITH PROPOFOL N/A 09/22/2020   Procedure: ESOPHAGOGASTRODUODENOSCOPY (EGD) WITH PROPOFOL;  Surgeon: Meridee Score Netty Starring., MD;  Location: [facility] ENDOSCOPY;  Service: Gastroenterology;  Laterality: N/A;   FRACTURE SURGERY     HEMOSTASIS CLIP PLACEMENT  09/22/2020   Procedure: HEMOSTASIS CLIP PLACEMENT;  Surgeon: Meridee Score Netty Starring., MD;  Location: [facility] ENDOSCOPY;  Service: Gastroenterology;;   HEMOSTASIS CONTROL  09/22/2020   Procedure: HEMOSTASIS CONTROL;  Surgeon: Lemar Lofty., MD;  Location: [facility] ENDOSCOPY;  Service: Gastroenterology;;   INSERTION OF MESH  02/19/2023   Procedure: INSERTION OF MESH;  Surgeon: Henrene Dodge, MD;  Location: [facility] ORS;  Service: General;;  umbilical   LAPAROSCOPIC PARTIAL RIGHT COLECTOMY Right 08/12/2012   Procedure: LAPAROSCOPIC PARTIAL RIGHT COLECTOMY; Location: [facility];  Surgeon: Renda Rolls, MD   LEFT HEART CATH AND CORONARY ANGIOGRAPHY Left 06/09/2020   Procedure: LEFT HEART CATH AND CORONARY ANGIOGRAPHY;  Surgeon: Alwyn Pea, MD;  Location: [facility];  Service: Cardiovascular;  Laterality: Left;   LEFT HEART CATH AND CORS/GRAFTS ANGIOGRAPHY Left 11/20/2016   Procedure: Left Heart Cath and Cors/Grafts Angiography;  Surgeon: Alwyn Pea, MD;  Location: [facility];  Service: Cardiovascular;  Laterality: Left;   POLYPECTOMY  09/22/2020   Procedure: POLYPECTOMY;  Surgeon: Mansouraty, Netty Starring., MD;  Location: [facility] ENDOSCOPY;  Service: Gastroenterology;;   SUBMUCOSAL LIFTING INJECTION  09/22/2020   Procedure: SUBMUCOSAL LIFTING INJECTION;  Surgeon: Lemar Lofty., MD;  Location: [facility] ENDOSCOPY;  Service: Gastroenterology;;   UMBILICAL HERNIA REPAIR N/A 02/19/2023   Procedure: HERNIA REPAIR UMBILICAL ADULT, open;  Surgeon: Henrene Dodge, MD;  Location: [facility] OR[facility];  Service: General;  Laterality: N/A;   XI ROBOTIC ASSISTED INGUINAL HERNIA REPAIR WITH MESH Right 02/19/2023   Procedure: XI ROBOTIC ASSISTED INGUINAL HERNIA REPAIR WITH MESH;  Surgeon: Henrene Dodge, MD;  Location: [facility] ORS;  Service: General;  Laterality: Right;    Home Medications:  Allergies as of 07/22/2023       Reactions   Isosorbide Cough        Medication List        Accurate as of July 22, 2023 10:33 AM. If you have any questions, ask your nurse or doctor.          STOP taking these medications    insulin glargine 100 UNIT/ML injection Commonly known as: LANTUS Stopped by: Armentha Branagan   spironolactone 25 MG tablet Commonly known as: ALDACTONE Stopped by: Murriel Holwerda   vitamin D3 25 MCG tablet Commonly known as: CHOLECALCIFEROL Stopped by: Gala Padovano       TAKE these medications    acetaminophen 500 MG tablet Commonly known as: TYLENOL Take 2 tablets (1,000 mg total) by mouth every 6 (six) hours as needed for  mild pain.   amLODipine 5 MG tablet Commonly known as: NORVASC Take 5 mg by mouth 2 (two) times daily.   apixaban 5 MG Tabs tablet Commonly known as: ELIQUIS Take 1 tablet (5 mg  total) by mouth 2 (two) times daily.   clopidogrel 75 MG tablet Commonly known as: PLAVIX Take 75 mg by mouth daily.   cyanocobalamin 1000 MCG tablet Commonly known as: VITAMIN B12 Take 1,000 mcg by mouth daily.   dexlansoprazole 60 MG capsule Commonly known as: DEXILANT Take 1 capsule by mouth daily.   hydrALAZINE 50 MG tablet Commonly known as: APRESOLINE Take 1 tablet by mouth 3 (three) times daily.   Iron 325 (65 Fe) MG Tabs Take 1 tablet by mouth daily.   labetalol 100 MG tablet Commonly known as: NORMODYNE Take 1 tablet by mouth 2 (two) times daily.   metFORMIN 500 MG 24 hr tablet Commonly known as: GLUCOPHAGE-XR Take 500 mg by mouth 2 (two) times daily.   rosuvastatin 40 MG tablet Commonly known as: CRESTOR Take 40 mg by mouth daily.   sitaGLIPtin 100 MG tablet Commonly known as: JANUVIA Take 100 mg by mouth daily.   Testosterone 1.62 % Gel Apply 2 Pump topically daily. One pump on each arm   torsemide 20 MG tablet Commonly known as: DEMADEX Take 1 tablet (20 mg total) by mouth 2 (two) times daily.        Allergies:  Allergies  Allergen Reactions   Isosorbide Cough    Family History: No family history on file.  Social History:  reports that he has quit smoking. His smoking use included cigarettes. He has never been exposed to tobacco smoke. He has never used smokeless tobacco. He reports that he does not drink alcohol and does not use drugs.   Physical Exam: BP (!) 136/57 (BP Location: Left Arm, Patient Position: Sitting, Cuff Size: Large)   Pulse 65   Ht 6\' 1"  (1.854 m)   Wt 205 lb (93 kg)   BMI 27.05 kg/m   Constitutional:  Well nourished. Alert and oriented, No acute distress. HEENT:  AT, moist mucus membranes.  Trachea midline Cardiovascular: No  clubbing, cyanosis, or edema. Respiratory: Normal respiratory effort, no increased work of breathing. Neurologic: Grossly intact, no focal deficits, moving all 4 extremities. Psychiatric: Normal mood and affect.   Laboratory Data: Renal Function Panel Order: [id number] Component Ref Range & Units 2 mo ago  Glucose 65 - 99 mg/dL 409 High   Comment:                                                                                             Fasting reference interval                                               For someone without known diabetes, a glucose value      between 100 and 125 mg/dL is consistent with      prediabetes and should be confirmed with a      follow-up test.         BUN 7 - 25 mg/dL 45 High   Creatinine 8.11 - 1.28 mg/dL 9.14 High   eGFR CKD-EPI CR 2021 >  OR = 60 mL/min/1.91m2 39 Low   BUN/Creatinine Ratio 6 - 22 (calc) 25 High   Sodium 135 - 146 mmol/L 141  Potassium 3.5 - 5.3 mmol/L 4.5  Chloride 98 - 110 mmol/L 106  Bicarbonate (CO2) 20 - 32 mmol/L 26  Calcium 8.6 - 10.3 mg/dL 86.5 High   Phosphorus 2.1 - 4.3 mg/dL 2.2  Albumin 3.6 - 5.1 g/dL 4.1  Resulting Agency See order comments   Specimen Collected: 05/01/23 15:09   Performed by: Chancy Hurter Last Resulted: 05/02/23 12:44  Received From: [facility]  Result Received: 05/03/23 07:57   Urinalysis Urinalysis Order: [id number] Component Ref Range & Units 2 mo ago  Color, Urine YELLOW YELLOW  Appearance Urine CLEAR CLEAR  Specific Gravity, UA 1.001 - 1.035 1.015  pH Urine 5.0 - 8.0 5.5  Glucose, Ur NEGATIVE NEGATIVE  Ketones, Urine NEGATIVE NEGATIVE  Hemoglobin Ur Ql Strip NEGATIVE NEGATIVE  Protein, Ur NEGATIVE 2+ Abnormal   Resulting Agency See order comments   Specimen Collected: 05/01/23 15:09   Performed by: Chancy Hurter Last Resulted: 05/02/23 12:44  Received From: [facility]  Result Received: 05/03/23 07:57  I have reviewed the labs.   Pertinent  Imaging Narrative & Impression  CLINICAL DATA:  Chronic renal disease   EXAM: RENAL / URINARY TRACT ULTRASOUND COMPLETE   COMPARISON:  CT chest 02/09/2011   FINDINGS: Right Kidney:   Renal measurements: 13.0 x 5.1 x 5.2 cm = volume: 178 mL. Normal renal cortical thickness and echogenicity. No hydronephrosis. There are multiple cysts within the right kidney measuring up to 3 cm. No imaging follow-up needed.   Left Kidney:   Renal measurements: 13.1 x 5.0 x 5.1 cm = volume: 172 mL. Normal renal cortical thickness and echogenicity. No hydronephrosis. Multiple cysts within the left kidney measuring up to 4.3 cm. No imaging follow-up needed.   Bladder:   Appears normal for degree of bladder distention.   Other:   Small bilateral pleural effusions. Trace ascites, predominantly perihepatic.   IMPRESSION: 1. No hydronephrosis. 2. Small bilateral pleural effusions. 3. Trace ascites.     Electronically Signed   By: Annia Belt M.D.   On: 05/14/2023 14:11  I have independently reviewed the films.  See HPI.    Assessment & Plan:    1. Testosterone deficiency  -testosterone level within therapeutic levels -Hemoglobin/hematocrit anemic, followed by hematology and is seeing them this week -He is not having chest pain, shortness of breath, blood in the urine or blood in the stool -Increase AndroGel 1.62%, 2 pumps daily -Will repeat testosterone levels and hemoglobin/hematocrit in 1 month  2. BPH with LUTS  - PSA stable  - symptoms - frequency -attributed to the diuretics for his congestive heart failure -continue conservative management, avoiding bladder irritants and timed voiding's  3. ED -Does not desire any treatment  4. Non-healing wound -He is still not seen dermatology for the wound on his left tragus, I contacted [facility] dermatology personally, but they stated he would have to call to make the appointment -I gave Mr. Wingert their phone number and also placed a  referral -I am concerned this may be squamous cell carcinoma  Return in about 1 month (around 08/22/2023) for Testosterone, hemoglobin and hematocrit.  Cloretta Ned  [facility] [address] [place], [place] [address] [phone]

## 2023-07-19 ENCOUNTER — Other Ambulatory Visit
Admission: RE | Admit: 2023-07-19 | Discharge: 2023-07-19 | Disposition: A | Discharge status: Home / Self Care | Payer: Medicare HMO | Attending: Urology | Admitting: Urology

## 2023-07-19 DIAGNOSIS — N401 Enlarged prostate with lower urinary tract symptoms: Secondary | ICD-10-CM | POA: Diagnosis present

## 2023-07-19 DIAGNOSIS — N138 Other obstructive and reflux uropathy: Secondary | ICD-10-CM | POA: Diagnosis present

## 2023-07-19 DIAGNOSIS — E291 Testicular hypofunction: Secondary | ICD-10-CM

## 2023-07-19 LAB — HEMOGLOBIN AND HEMATOCRIT, BLOOD
HCT: 27.7 % — ABNORMAL LOW (ref 39.0–52.0)
Hemoglobin: 8.9 g/dL — ABNORMAL LOW (ref 13.0–17.0)

## 2023-07-19 LAB — PSA: Prostatic Specific Antigen: 0.28 ng/mL (ref 0.00–4.00)

## 2023-07-21 LAB — TESTOSTERONE: Testosterone: 464 ng/dL (ref 264–916)

## 2023-07-22 ENCOUNTER — Encounter: Payer: Self-pay | Admitting: Urology

## 2023-07-22 ENCOUNTER — Ambulatory Visit: Payer: Medicare HMO | Admitting: Urology

## 2023-07-22 VITALS — BP 136/57 | HR 65 | Ht 73.0 in | Wt 205.0 lb

## 2023-07-22 DIAGNOSIS — E291 Testicular hypofunction: Secondary | ICD-10-CM

## 2023-07-22 DIAGNOSIS — N401 Enlarged prostate with lower urinary tract symptoms: Secondary | ICD-10-CM | POA: Diagnosis not present

## 2023-07-22 DIAGNOSIS — T148XXA Other injury of unspecified body region, initial encounter: Secondary | ICD-10-CM

## 2023-07-22 DIAGNOSIS — N529 Male erectile dysfunction, unspecified: Secondary | ICD-10-CM | POA: Diagnosis not present

## 2023-07-22 DIAGNOSIS — E349 Endocrine disorder, unspecified: Secondary | ICD-10-CM

## 2023-07-22 DIAGNOSIS — N138 Other obstructive and reflux uropathy: Secondary | ICD-10-CM

## 2023-07-22 MED ORDER — TESTOSTERONE 1.62 % TD GEL: 2.0000 | Freq: Every day | TRANSDERMAL | 3 refills | Status: DC

## 2023-07-23 ENCOUNTER — Inpatient Hospital Stay: Payer: Medicare HMO | Attending: Oncology

## 2023-07-23 DIAGNOSIS — E349 Endocrine disorder, unspecified: Secondary | ICD-10-CM

## 2023-07-23 DIAGNOSIS — N138 Other obstructive and reflux uropathy: Secondary | ICD-10-CM | POA: Insufficient documentation

## 2023-07-23 DIAGNOSIS — D509 Iron deficiency anemia, unspecified: Secondary | ICD-10-CM | POA: Diagnosis present

## 2023-07-23 DIAGNOSIS — D649 Anemia, unspecified: Secondary | ICD-10-CM

## 2023-07-23 DIAGNOSIS — N401 Enlarged prostate with lower urinary tract symptoms: Secondary | ICD-10-CM | POA: Diagnosis not present

## 2023-07-23 LAB — CBC
HCT: 25.5 % — ABNORMAL LOW (ref 39.0–52.0)
Hemoglobin: 8.1 g/dL — ABNORMAL LOW (ref 13.0–17.0)
MCH: 31.9 pg (ref 26.0–34.0)
MCHC: 31.8 g/dL (ref 30.0–36.0)
MCV: 100.4 fL — ABNORMAL HIGH (ref 80.0–100.0)
Platelets: 100 10*3/uL — ABNORMAL LOW (ref 150–400)
RBC: 2.54 MIL/uL — ABNORMAL LOW (ref 4.22–5.81)
RDW: 15.3 % (ref 11.5–15.5)
WBC: 4.3 10*3/uL (ref 4.0–10.5)
nRBC: 0 % (ref 0.0–0.2)

## 2023-07-23 LAB — IRON AND TIBC
Iron: 54 ug/dL (ref 45–182)
Saturation Ratios: 16 % — ABNORMAL LOW (ref 17.9–39.5)
TIBC: 340 ug/dL (ref 250–450)
UIBC: 286 ug/dL

## 2023-07-23 LAB — FERRITIN: Ferritin: 68 ng/mL (ref 24–336)

## 2023-07-23 LAB — VITAMIN B12: Vitamin B-12: 597 pg/mL (ref 180–914)

## 2023-07-23 LAB — PSA: Prostatic Specific Antigen: 0.27 ng/mL (ref 0.00–4.00)

## 2023-07-24 LAB — TESTOSTERONE: Testosterone: 209 ng/dL — ABNORMAL LOW (ref 264–916)

## 2023-07-30 ENCOUNTER — Telehealth: Payer: Self-pay | Admitting: Urology

## 2023-07-30 NOTE — Telephone Encounter (Signed)
Patient called and stated that [facility] needs [name] to call to authorize prescription for Testosterone Gel. Insurance told patient they had reached out, but have not gotten response. The phone number for [facility] is [phone]

## 2023-07-31 NOTE — Telephone Encounter (Signed)
Left message on VM (per DPR) that testosterone has been approved.

## 2023-08-01 ENCOUNTER — Encounter: Payer: Self-pay | Admitting: Physician Assistant

## 2023-08-01 ENCOUNTER — Telehealth: Payer: Self-pay | Admitting: *Deleted

## 2023-08-01 ENCOUNTER — Ambulatory Visit (INDEPENDENT_AMBULATORY_CARE_PROVIDER_SITE_OTHER): Payer: Medicare HMO | Admitting: Physician Assistant

## 2023-08-01 VITALS — BP 123/49 | HR 62 | Temp 97.7°F | Ht 73.0 in | Wt 212.6 lb

## 2023-08-01 DIAGNOSIS — R160 Hepatomegaly, not elsewhere classified: Secondary | ICD-10-CM

## 2023-08-01 DIAGNOSIS — D649 Anemia, unspecified: Secondary | ICD-10-CM

## 2023-08-01 DIAGNOSIS — R7989 Other specified abnormal findings of blood chemistry: Secondary | ICD-10-CM

## 2023-08-01 DIAGNOSIS — D696 Thrombocytopenia, unspecified: Secondary | ICD-10-CM

## 2023-08-01 DIAGNOSIS — D509 Iron deficiency anemia, unspecified: Secondary | ICD-10-CM | POA: Diagnosis not present

## 2023-08-01 DIAGNOSIS — D631 Anemia in chronic kidney disease: Secondary | ICD-10-CM

## 2023-08-01 DIAGNOSIS — R809 Proteinuria, unspecified: Secondary | ICD-10-CM

## 2023-08-01 DIAGNOSIS — N189 Chronic kidney disease, unspecified: Secondary | ICD-10-CM | POA: Diagnosis not present

## 2023-08-01 DIAGNOSIS — R1907 Generalized intra-abdominal and pelvic swelling, mass and lump: Secondary | ICD-10-CM

## 2023-08-01 DIAGNOSIS — Z8719 Personal history of other diseases of the digestive system: Secondary | ICD-10-CM

## 2023-08-01 DIAGNOSIS — D638 Anemia in other chronic diseases classified elsewhere: Secondary | ICD-10-CM

## 2023-08-01 HISTORY — DX: Proteinuria, unspecified: R80.9

## 2023-08-01 NOTE — Telephone Encounter (Signed)
I called the patient's house and got a voicemail and went over that the labs for iron ferritin-68, and hgb 8.1 and Dr. Smith Robert says that it would be good if you can get some IV iron if that is what you want.  We would be glad to set it up for you if you can give [place] a call back [phone] thank you

## 2023-08-01 NOTE — Patient Instructions (Addendum)
Abdominal Ultrasound scheduled @ [facility] on 08/06/23 arrive at  7:45 am check in at registration desk. Nothing to eat/drink after midnight.

## 2023-08-01 NOTE — Progress Notes (Signed)
Celso Amy, PA-C [address]  [address]  [place], [place] [address]  Main: [phone]  Fax: [phone]   Gastroenterology Consultation  Referring Provider:     Jaclyn Shaggy, MD Primary Care Physician:  Jaclyn Shaggy, MD Primary Gastroenterologist:  Celso Amy, PA-C / Dr. Wyline Mood   Reason for Consultation:     Anemia, Bad Taste        HPI:   [name] is a 77 y.o. y/o male referred for consultation & management  by Jaclyn Shaggy, MD. Patient has had worsening anemia in the past 7 months.  History of anemia of chronic disease and iron deficiency anemia.  Has received IV iron through Dr. Smith Robert hematologist.  Patient has had some dark soft loose stools.  He denies bright red rectal bleeding.  He denies constipation, abdominal pain, heartburn, dysphagia, nausea, or vomiting.  His weight has been up and down due to excessive fluid retention, CHF, extremity edema, changing diuretics.  He admits to moderate abdominal and lower extremity edema.  07/30/2023 labs: Hemoglobin 8.1, hematocrit 25, MCV 100, platelet 130, BUN 53, creatinine 2.7, GFR 24, glucose 123.  Vitamin B12 of 597. 04/2023: Hemoglobin 9.9 12/2022: Hemoglobin 11.5  05/2021: Colonoscopy: Good prep, normal ileocolonic anastomosis, One 4 mm tubular adenoma and One 6 mm hyperplastic polyp removed from transverse colon.  Internal hemorrhoids.  5-year repeat colonoscopy recommended (due 05/2026).  09/2020: EGD by Dr. Meridee Score: Multiple hyperplastic gastric polyps.  5 actively oozing polyps were removed, 1 with mucosal resection, and 4 with saline lift polypectomy.  Clips were placed.  Widely patent Schatzki's ring.  2 cm hiatal hernia.  Pathology of gastric polyps showed benign hyperplastic polyps.  Negative for intestinal metaplasia or dysplasia.  08/2020 colonoscopy by Dr. Maximino Greenland: Fair prep, hemorrhoids, 1 small 6 mm tubular adenoma polyp removed from sigmoid colon.  08/2020 EGD by Dr. Maximino Greenland: Normal  esophagus and duodenum.  Multiple large gastric polyps.  Biopsies showed hyperplastic and reactive foveolar hyperplasia.  No dysplasia or malignancy.  Negative for H. pylori.  Last saw Dr. Maximino Greenland in our office 03/2021 to follow-up iron deficiency anemia and constipation.  Had a large hyperplastic gastric polyps removed by Dr. Meridee Score.  History of GERD, hiatal hernia and Schatzki's ring.  Currently on Dexilant 60 Mg daily.  History of right partial colectomy in 2013.  Has anemia of chronic kidney disease.  Followed by Nephrology. History of CAD, A-fib, CHF, aortic stenosis.  Currently on Eliquis and Plavix.  History of GI bleed in 2022.  Admitted to hospital on 12/2022 due to acute respiratory failure with hypoxia and acute on chronic congestive heart failure. PMH significant for acute on chronic CHF, CAD, S/P CABG, S/P PCI and stent, hypertension, hyperlipidemia, OSA, diabetes, GERD, hypothyroidism, and obesity.   Last saw cardiology 07/30/2023.  He has been having some left-sided chest pain and shortness of breath recently.  He is scheduled for nuclear medicine stress test 08/08/2023.  Past Medical History:  Diagnosis Date   (HFpEF) heart failure with preserved ejection fraction (HCC) 07/25/2016   a.)TTE 07/25/16: EF >55, triv-mild pan regur, mild AS, G2DD; b.)TTE 09/02/16: EF >55, triv TR, mild AS; c.)TTE 11/05/16: EF >55, LAE, triv-mild pan regur, mild AS, G2DD; d.)TTE 01/21/20: EF >55, mod LVH, LAE/RVE, triv-mild AR/PR/TR, mod MR, mild AS, G2DD; e.)TTE 09/24/22: EF>55, sev LVH, sev LAE, RAE, mild PR, mod AR/MR/TR, mild-mod AS, RVSP 74.7; f.)TTE 12/23/22: EF 60-65, LAE, mild-mod MR/TR, mild AS  Abnormal urine 05/01/2023   Anemia    Aortic atherosclerosis (HCC)    Aortic stenosis 07/25/2016   a.) TTE 07/25/2016: mild (MPG 13.5); b.) TTE 09/02/2016: mild (MPG 16); c.) TTE 11/05/2016: mild (MPG 9); d.) TTE 01/21/2020: mild (MPG 11.7); e.) TTE 09/24/2022: mild-mod (MPG 20.3); f.) TTE 12/23/2022: mild  (MPG 15)   Arthritis    Atrial fibrillation (HCC)    a.) CHA2DS2VASc = 6 (age x2, CHF, HTN, vascular disease history, T2DM);  b.) rate/rhythm maintained on oral labetalol; chronically anticoagulated with apixaban + clopidogrel   AV block, 1st degree    B12 deficiency    CKD (chronic kidney disease), stage III (HCC)    Colon polyps    Congestive heart failure (HCC) 05/01/2023   Coronary artery disease 08/16/2016   a.) s/p 3v CABG 08/30/2016; b.) s/p PCI 11/26/2016 (DES x 1 oSVG-RCA); c.) s/p PCI 12/17/2016 (DES x 5 --> mLCx x2, dLCx, pLAD, dLAD); c.) s/p PCI 06/09/2020 (DES x1 --> ISR oSVG-PDA)   Cyst of kidney, acquired 07/03/2023   Dyspnea    Gastroesophageal reflux disease 05/01/2023   GERD (gastroesophageal reflux disease)    Hepatic steatosis    History of 2019 novel coronavirus disease (COVID-19) 09/02/2020   a.) Tx'd with remdesivir   History of blood transfusion    History of GI bleed 09/02/2020   a.) admitted to [facility] 09/02/2020 - 09/08/2020   History of heart artery stent 11/26/2016   TOTAL of 7 stents: a.) 11/26/2016 --> 3.5 x 22 mm Resolute Integrity oSVG-RCA; b.) 12/17/2016: 3.0 x 12 mm Xience Alpine dLCx, 3.5 x 22 mm Resolute Onyx mLCx, 3.5 x 18 mm Resolute Onyx mLCx, 3.0 x 26 mm Resolute Onyx pLAD, 2.0 x 26 mm Resolute Onyx dLAD; c.) 06/09/2020 --> 2.5 x 18 mm Resolute Onyx ISR oSVG-PDA   History of partial colectomy 08/12/2012   a.) large gastric hyperplastic polyp removal   HLD (hyperlipidemia)    Hypertension    LBBB (left bundle branch block)    Long term current use of anticoagulant    a.) apixaban   Long term current use of antithrombotics/antiplatelets    a.) clopidogrel   Male hypogonadism    a.) on exogenous TRT (1.62% gel)   Mild cardiomegaly    Mild gynecomastia (bilateral)    Nephrolithiasis    OSA (obstructive sleep apnea)    a.) does not utilize nocturnal PAP therapy   Personal history of surgery to heart and great vessels, presenting hazards to  health 05/01/2023   Pulmonary hypertension (HCC) 09/24/2022   a.) TTE 09/24/2022: RVSP 74.7; b.) TTE 12/23/2022: RVSP 31.3   RBBB (right bundle branch block with left anterior fascicular block)    Right inguinal hernia    Right thyroid nodule    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   a.) LIMA-LAD, SVG-PDA, SVG-OM3   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/01/2023   Type 2 diabetes mellitus treated with insulin (HCC)    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   Umbilical hernia    Unstable angina (HCC)    Vitamin D deficiency     Past Surgical History:  Procedure Laterality Date   APPENDECTOMY     CARDIAC CATHETERIZATION Left 08/16/2016   Procedure: Left Heart Cath and Coronary Angiography;  Surgeon: Alwyn Pea, MD;  Location: [facility]IVE [facility];  Service: Cardiovascular;  Laterality: Left;   COLONOSCOPY N/A 09/07/2020   Procedure: COLONOSCOPY;  Surgeon: Pasty Spillers, MD;  Location: [facility] ENDOSCOPY;  Service: Endoscopy;  Laterality: N/A;   COLONOSCOPY WITH PROPOFOL N/A 05/17/2021   Procedure: COLONOSCOPY WITH PROPOFOL;  Surgeon: Pasty Spillers, MD;  Location: [facility] ENDOSCOPY;  Service: Endoscopy;  Laterality: N/A;   CORONARY ANGIOPLASTY WITH STENT PLACEMENT Left 11/26/2016   Procedure: CORONARY ANGIOPLASTY WITH STENT PLACEMENT; Location: [facility]; Surgeon: Jerrell Mylar, MD   CORONARY ARTERY BYPASS GRAFT N/A 08/30/2016   Procedure: CORONARY ARTERY BYPASS GRAFT; Location: [facility]; Surgeon: Nance Pew, MD   CORONARY STENT INTERVENTION N/A 06/09/2020   Procedure: CORONARY STENT INTERVENTION;  Surgeon: Alwyn Pea, MD;  Location: [facility];  Service: Cardiovascular;  Laterality: N/A;   CORONARY STENT INTERVENTION Left 12/17/2016   Procedure: CORONARY STENT INTERVENTION; Location: [facility]; Surgeon: Jerrell Mylar, MD   ENDOSCOPIC MUCOSAL RESECTION N/A 09/22/2020   Procedure: ENDOSCOPIC MUCOSAL RESECTION;  Surgeon: Lemar Lofty., MD;  Location: [facility] ENDOSCOPY;  Service: Gastroenterology;  Laterality: N/A;   ESOPHAGOGASTRODUODENOSCOPY N/A 09/07/2020   Procedure: ESOPHAGOGASTRODUODENOSCOPY (EGD);  Surgeon: Pasty Spillers, MD;  Location: [facility] ENDOSCOPY;  Service: Endoscopy;  Laterality: N/A;   ESOPHAGOGASTRODUODENOSCOPY (EGD) WITH PROPOFOL N/A 09/22/2020   Procedure: ESOPHAGOGASTRODUODENOSCOPY (EGD) WITH PROPOFOL;  Surgeon: Meridee Score Netty Starring., MD;  Location: [facility] ENDOSCOPY;  Service: Gastroenterology;  Laterality: N/A;   FRACTURE SURGERY     HEMOSTASIS CLIP PLACEMENT  09/22/2020   Procedure: HEMOSTASIS CLIP PLACEMENT;  Surgeon: Meridee Score Netty Starring., MD;  Location[facility] ENDOSCOPY;  Service: Gastroenterology;;   HEMOSTASIS CONTROL  09/22/2020   Procedure: HEMOSTASIS CONTROL;  Surgeon: Lemar Lofty., MD;  Location: [facility] ENDOSCOPY;  Service: Gastroenterology;;   INSERTION OF MESH  02/19/2023   Procedure: INSERTION OF MESH;  Surgeon: Henrene Dodge, MD;  Location: [facility] ORS;  Service: General;;  umbilical   LAPAROSCOPIC PARTIAL RIGHT COLECTOMY Right 08/12/2012   Procedure: LAPAROSCOPIC PARTIAL RIGHT COLECTOMY; Location: [facility]; Surgeon: Renda Rolls, MD   LEFT HEART CATH AND CORONARY ANGIOGRAPHY Left 06/09/2020   Procedure: LEFT HEART CATH AND CORONARY ANGIOGRAPHY;  Surgeon: Alwyn Pea, MD;  Location: [facility];  Service: Cardiovascular;  Laterality: Left;   LEFT HEART CATH AND CORS/GRAFTS ANGIOGRAPHY Left 11/20/2016   Procedure: Left Heart Cath and Cors/Grafts Angiography;  Surgeon: Alwyn Pea, MD;  Location: [facility];  Service: Cardiovascular;  Laterality: Left;   POLYPECTOMY  09/22/2020   Procedure: POLYPECTOMY;  Surgeon: Mansouraty, Netty Starring., MD;  Location: [facility] ENDOSCOPY;  Service: Gastroenterology;;   SUBMUCOSAL LIFTING INJECTION  09/22/2020   Procedure: SUBMUCOSAL LIFTING INJECTION;  Surgeon: Lemar Lofty., MD;  Location: [facility] ENDOSCOPY;  Service:  Gastroenterology;;   UMBILICAL HERNIA REPAIR N/A 02/19/2023   Procedure: HERNIA REPAIR UMBILICAL ADULT, open;  Surgeon: Henrene Dodge, MD;  Location: [facility] ORS;  Service: General;  Laterality: N/A;   XI ROBOTIC ASSISTED INGUINAL HERNIA REPAIR WITH MESH Right 02/19/2023   Procedure: XI ROBOTIC ASSISTED INGUINAL HERNIA REPAIR WITH MESH;  Surgeon: Henrene Dodge, MD;  Location: [facility] ORS;  Service: General;  Laterality: Right;    Prior to Admission medications   Medication Sig Start Date End Date Taking? Authorizing Provider  acetaminophen (TYLENOL) 500 MG tablet Take 2 tablets (1,000 mg total) by mouth every 6 (six) hours as needed for mild pain. 02/19/23   Henrene Dodge, MD  amLODipine (NORVASC) 5 MG tablet Take 5 mg by mouth 2 (two) times daily. 07/03/23 07/02/24  [provider]  apixaban (ELIQUIS) 5 MG TABS tablet Take 1 tablet (5 mg total) by mouth 2 (two) times  daily. 12/27/22   Marrion Coy, MD  clopidogrel (PLAVIX) 75 MG tablet Take 75 mg by mouth daily.    [provider]  cyanocobalamin (VITAMIN B12) 1000 MCG tablet Take 1,000 mcg by mouth daily.    [provider]  dexlansoprazole (DEXILANT) 60 MG capsule Take 1 capsule by mouth daily. 12/25/21   [provider]  Ferrous Sulfate (IRON) 325 (65 Fe) MG TABS Take 1 tablet by mouth daily.    [provider]  hydrALAZINE (APRESOLINE) 50 MG tablet Take 1 tablet by mouth 3 (three) times daily. 05/10/21   [provider]  labetalol (NORMODYNE) 100 MG tablet Take 1 tablet by mouth 2 (two) times daily. 06/27/22   [provider]  metFORMIN (GLUCOPHAGE-XR) 500 MG 24 hr tablet Take 500 mg by mouth 2 (two) times daily.    [provider]  rosuvastatin (CRESTOR) 40 MG tablet Take 40 mg by mouth daily. 06/22/20   [provider]  sitaGLIPtin (JANUVIA) 100 MG tablet Take 100 mg by mouth daily.    [provider]  Testosterone 1.62 % GEL Apply 2 Pump topically daily. One pump  on each arm 07/22/23   McGowan, Carollee Herter A, PA-C  torsemide (DEMADEX) 20 MG tablet Take 1 tablet (20 mg total) by mouth 2 (two) times daily. 12/30/22 12/30/23  Marrion Coy, MD    History reviewed. No pertinent family history.   Social History   Tobacco Use   Smoking status: Former    Types: Cigarettes    Passive exposure: Never   Smokeless tobacco: Never  Vaping Use   Vaping status: Never Used  Substance Use Topics   Alcohol use: No   Drug use: No    Allergies as of 08/01/2023 - Review Complete 08/01/2023  Allergen Reaction Noted   Isosorbide Cough 11/14/2016    Review of Systems:    All systems reviewed and negative except where noted in HPI.   Physical Exam:  BP (!) 123/49   Pulse 62   Temp 97.7 F (36.5 C)   Ht 6\' 1"  (1.854 m)   Wt 212 lb 9.6 oz (96.4 kg)   BMI 28.05 kg/m  No LMP for male patient.  General:   Alert,  Well-developed, well-nourished, pleasant and cooperative in NAD Lungs:  Respirations even and unlabored.  Clear throughout to auscultation.   No wheezes, crackles, or rhonchi. No acute distress. Heart:  Regular rate and rhythm; no murmurs, clicks, rubs, or gallops. Abdomen:  Normal bowel sounds.  No bruits.  Soft, and non-distended without masses, hepatosplenomegaly or hernias noted.  No Tenderness.  No guarding or rebound tenderness.    Neurologic:  Alert and oriented x3;  grossly normal neurologically. Psych:  Alert and cooperative. Normal mood and affect.  Imaging Studies: No results found.  Assessment and Plan:   TAVARIOUS ANZALDUA is a 77 y.o. y/o male has been referred for:  1.  Anemia, worsening, multifactorial 2.  Anemia of chronic kidney disease 3.  Iron deficiency anemia 4.  Thrombocytopenia 5.  History of upper GI bleed with IDA in 2022 attributed to multiple large hyperplastic gastric polyps removed by Dr. Meridee Score.  No dysplasia or malignancy.  5.  Elevated LFTs and abdominal swelling; evaluate for cirrhosis and ascites  Labs:  hepatic panel, PT/INR  Scheduling complete abdominal ultrasound, evaluate liver, evaluate for ascites.  Palpable hepatomegaly on exam.  6.  History of GI bleed and recent dark stools; Currently on PO Iron every day. Stable Hgb 8.1g, MCV  100, Plt 130 (checked 12/17 - 2 days ago).  Continue Dexilant 60 mg daily.  Continue to avoid all NSAIDS.  Needs repeat EGD; Cannot schedule until after he has Cardiac Nuclear medicine scress test scheduled on 08/08/23.  Needs cardiac clearance before we schedule EGD with sedation.  ED precautions given.  He is instructed to go to the ED if he has worsening black tarry stools, melena, shortness of breath, or weakness.  7.  Multiple comorbidities: CKD 4, CAD, CHF, A-fib, on Eliquis and Plavix.  He is currently scheduled for nuclear medicine stress test 08/08/2023 to evaluate left chest pain and shortness of breath.   Follow up in 4 weeks with TG.  In 8 weeks with Dr. Tobi Bastos or Dr. Allegra Lai.  Celso Amy, PA-C

## 2023-08-02 ENCOUNTER — Telehealth: Payer: Self-pay

## 2023-08-02 LAB — HEPATIC FUNCTION PANEL
ALT: 19 [IU]/L (ref 0–44)
AST: 18 [IU]/L (ref 0–40)
Albumin: 4.3 g/dL (ref 3.8–4.8)
Alkaline Phosphatase: 115 [IU]/L (ref 44–121)
Bilirubin Total: 0.4 mg/dL (ref 0.0–1.2)
Bilirubin, Direct: 0.25 mg/dL (ref 0.00–0.40)
Total Protein: 6.9 g/dL (ref 6.0–8.5)

## 2023-08-02 LAB — PROTIME-INR
INR: 1.2 (ref 0.9–1.2)
Prothrombin Time: 13.8 s — ABNORMAL HIGH (ref 9.1–12.0)

## 2023-08-02 NOTE — Progress Notes (Signed)
Please call and notify pt. Labs have improved compared to 7 months ago.  PT is a little elevated, yet improved.  INR is normal. PT and INR are labs associated with increased risk of bleeding and have improved.  His liver tests have improved to Normal.  This is great news.  Continue with current plan for Abdominal Ultrasound. Celso Amy, PA-C

## 2023-08-02 NOTE — Telephone Encounter (Signed)
Left message for patient to return call to office.  Please call and notify pt. Labs have improved compared to 7 months ago.  PT is a little elevated, yet improved.  INR is normal. PT and INR are labs associated with increased risk of bleeding and have improved.  His liver tests have improved to Normal.  This is great news.  Continue with current plan for Abdominal Ultrasound.  Celso Amy, PA-C

## 2023-08-05 ENCOUNTER — Inpatient Hospital Stay: Payer: Medicare HMO

## 2023-08-05 ENCOUNTER — Telehealth: Payer: Self-pay

## 2023-08-05 VITALS — BP 133/62 | HR 60 | Temp 98.0°F | Resp 18

## 2023-08-05 DIAGNOSIS — D509 Iron deficiency anemia, unspecified: Secondary | ICD-10-CM | POA: Diagnosis not present

## 2023-08-05 DIAGNOSIS — D508 Other iron deficiency anemias: Secondary | ICD-10-CM

## 2023-08-05 MED ORDER — IRON SUCROSE 20 MG/ML IV SOLN
200.0000 mg | INTRAVENOUS | Status: DC
  Administered 2023-08-05: 200 mg via INTRAVENOUS
  Filled 2023-08-05: qty 10

## 2023-08-05 MED ORDER — SODIUM CHLORIDE 0.9% FLUSH
10.0000 mL | Freq: Once | INTRAVENOUS | Status: AC | PRN
  Administered 2023-08-05: 10 mL
  Filled 2023-08-05: qty 10

## 2023-08-05 NOTE — Telephone Encounter (Signed)
Received anticoagulant clearance to hold Eliquis/Palvix for 5 days prior.and to restart 2 days after procedure. Patient has follow up with Inetta Fermo 08/15/23 @ 11:15 to discuss EGD.

## 2023-08-05 NOTE — Telephone Encounter (Signed)
Left message for patient to return call to office.  Please call and notify pt. Labs have improved compared to 7 months ago.  PT is a little elevated, yet improved.  INR is normal. PT and INR are labs associated with increased risk of bleeding and have improved.  His liver tests have improved to Normal.  This is great news.  Continue with current plan for Abdominal Ultrasound.  Celso Amy, PA-C

## 2023-08-05 NOTE — Telephone Encounter (Signed)
Spoke with patient Please call and notify pt. Labs have improved compared to 7 months ago.  PT is a little elevated, yet improved.  INR is normal. PT and INR are labs associated with increased risk of bleeding and have improved.  His liver tests have improved to Normal.  This is great news.  Continue with current plan for Abdominal Ultrasound.  Celso Amy, PA-C ---scheduled 08/15/23 @ 11:15 am .

## 2023-08-06 ENCOUNTER — Ambulatory Visit
Admission: RE | Admit: 2023-08-06 | Discharge: 2023-08-06 | Disposition: A | Discharge status: Home / Self Care | Payer: Medicare HMO | Source: Ambulatory Visit | Attending: Physician Assistant | Admitting: Physician Assistant

## 2023-08-06 DIAGNOSIS — R1907 Generalized intra-abdominal and pelvic swelling, mass and lump: Secondary | ICD-10-CM | POA: Diagnosis present

## 2023-08-06 DIAGNOSIS — R7989 Other specified abnormal findings of blood chemistry: Secondary | ICD-10-CM | POA: Diagnosis present

## 2023-08-11 NOTE — Progress Notes (Signed)
Please call and notify patient RUQ abdominal ultrasound shows: 1.  Upper abdominal ascites, which is fluid in the upper abdomen. 2.  Bilateral lung effusions (fluid in the lower lungs). 3.  Gallstones in the gallbladder.  No evidence of infection. 4.  Multiple gallbladder polyps measuring up to 8 mm.  Repeat ultrasound in 6 months to monitor polyps.   **I recommend order CT WithOUT contrast of Chest, Abdomen, and Pelvis.  Diagnoses: Bilateral Pleural Effusions, Abdominal Ascites.  No contrast due to elevated Creatinine 2.83.   **Pending CT results and amount of abdominal swelling / ascites, We may need to order a diagnostic and therapeutic Paracentesis. Celso Amy, PA-C

## 2023-08-12 ENCOUNTER — Other Ambulatory Visit: Payer: Self-pay

## 2023-08-12 ENCOUNTER — Telehealth: Payer: Self-pay

## 2023-08-12 DIAGNOSIS — J90 Pleural effusion, not elsewhere classified: Secondary | ICD-10-CM

## 2023-08-12 DIAGNOSIS — R188 Other ascites: Secondary | ICD-10-CM

## 2023-08-12 NOTE — Telephone Encounter (Signed)
Stat CT Chest.Abdomen and pelvis 08/13/23 @ 5 pm arrival [facility] no prep- patient has scheduled follow up appointment 08/15/23.

## 2023-08-13 ENCOUNTER — Ambulatory Visit: Payer: Medicare HMO

## 2023-08-14 HISTORY — PX: ANOMALOUS PULMONARY VENOUS RETURN REPAIR, TOTAL: SHX1156

## 2023-08-15 ENCOUNTER — Ambulatory Visit: Payer: Medicare HMO | Admitting: Physician Assistant

## 2023-08-16 ENCOUNTER — Ambulatory Visit: Payer: Medicare HMO

## 2023-08-16 ENCOUNTER — Telehealth: Payer: Self-pay

## 2023-08-16 ENCOUNTER — Inpatient Hospital Stay: Payer: Medicare HMO | Attending: Oncology

## 2023-08-16 VITALS — BP 138/71 | HR 60 | Temp 96.0°F | Resp 18

## 2023-08-16 DIAGNOSIS — D509 Iron deficiency anemia, unspecified: Secondary | ICD-10-CM | POA: Insufficient documentation

## 2023-08-16 DIAGNOSIS — D508 Other iron deficiency anemias: Secondary | ICD-10-CM

## 2023-08-16 MED ORDER — IRON SUCROSE 20 MG/ML IV SOLN
200.0000 mg | INTRAVENOUS | Status: DC
  Administered 2023-08-16: 200 mg via INTRAVENOUS

## 2023-08-16 NOTE — Patient Instructions (Signed)
 Iron Sucrose Injection What is this medication? IRON SUCROSE (EYE ern SOO krose) treats low levels of iron (iron deficiency anemia) in people with kidney disease. Iron is a mineral that plays an important role in making red blood cells, which carry oxygen from your lungs to the rest of your body. This medicine may be used for other purposes; ask your health care provider or pharmacist if you have questions. COMMON BRAND NAME(S): Venofer What should I tell my care team before I take this medication? They need to know if you have any of these conditions: Anemia not caused by low iron levels Heart disease High levels of iron in the blood Kidney disease Liver disease An unusual or allergic reaction to iron, other medications, foods, dyes, or preservatives Pregnant or trying to get pregnant Breastfeeding How should I use this medication? This medication is for infusion into a vein. It is given in a hospital or clinic setting. Talk to your care team about the use of this medication in children. While this medication may be prescribed for children as young as 2 years for selected conditions, precautions do apply. Overdosage: If you think you have taken too much of this medicine contact a poison control center or emergency room at once. NOTE: This medicine is only for you. Do not share this medicine with others. What if I miss a dose? Keep appointments for follow-up doses. It is important not to miss your dose. Call your care team if you are unable to keep an appointment. What may interact with this medication? Do not take this medication with any of the following: Deferoxamine Dimercaprol Other iron products This medication may also interact with the following: Chloramphenicol Deferasirox This list may not describe all possible interactions. Give your health care provider a list of all the medicines, herbs, non-prescription drugs, or dietary supplements you use. Also tell them if you smoke,  drink alcohol, or use illegal drugs. Some items may interact with your medicine. What should I watch for while using this medication? Visit your care team regularly. Tell your care team if your symptoms do not start to get better or if they get worse. You may need blood work done while you are taking this medication. You may need to follow a special diet. Talk to your care team. Foods that contain iron include: whole grains/cereals, dried fruits, beans, or peas, leafy green vegetables, and organ meats (liver, kidney). What side effects may I notice from receiving this medication? Side effects that you should report to your care team as soon as possible: Allergic reactions--skin rash, itching, hives, swelling of the face, lips, tongue, or throat Low blood pressure--dizziness, feeling faint or lightheaded, blurry vision Shortness of breath Side effects that usually do not require medical attention (report to your care team if they continue or are bothersome): Flushing Headache Joint pain Muscle pain Nausea Pain, redness, or irritation at injection site This list may not describe all possible side effects. Call your doctor for medical advice about side effects. You may report side effects to FDA at 1-800-FDA-1088. Where should I keep my medication? This medication is given in a hospital or clinic. It will not be stored at home. NOTE: This sheet is a summary. It may not cover all possible information. If you have questions about this medicine, talk to your doctor, pharmacist, or health care provider.  2024 Elsevier/Gold Standard (2023-01-04 00:00:00)

## 2023-08-16 NOTE — Telephone Encounter (Signed)
 Left message on voicemail  CT has still not been approved, so will need to reschedule if insurance approves

## 2023-08-19 ENCOUNTER — Telehealth: Payer: Self-pay

## 2023-08-19 ENCOUNTER — Inpatient Hospital Stay: Payer: Medicare HMO

## 2023-08-19 VITALS — BP 136/61 | HR 55 | Temp 96.0°F | Resp 16

## 2023-08-19 DIAGNOSIS — D508 Other iron deficiency anemias: Secondary | ICD-10-CM

## 2023-08-19 DIAGNOSIS — D509 Iron deficiency anemia, unspecified: Secondary | ICD-10-CM | POA: Diagnosis not present

## 2023-08-19 MED ORDER — IRON SUCROSE 20 MG/ML IV SOLN
200.0000 mg | INTRAVENOUS | Status: DC
  Administered 2023-08-19: 200 mg via INTRAVENOUS
  Filled 2023-08-19: qty 10

## 2023-08-19 NOTE — Telephone Encounter (Signed)
 CT scheduled 08/20/23 [facility] 1:45 arrival. Also reminded patient 08/22/23 appointment with Inetta Fermo- ;eft message to call office to confim the appointment information.

## 2023-08-19 NOTE — Patient Instructions (Signed)
 Iron Sucrose Injection What is this medication? IRON SUCROSE (EYE ern SOO krose) treats low levels of iron (iron deficiency anemia) in people with kidney disease. Iron is a mineral that plays an important role in making red blood cells, which carry oxygen from your lungs to the rest of your body. This medicine may be used for other purposes; ask your health care provider or pharmacist if you have questions. COMMON BRAND NAME(S): Venofer What should I tell my care team before I take this medication? They need to know if you have any of these conditions: Anemia not caused by low iron levels Heart disease High levels of iron in the blood Kidney disease Liver disease An unusual or allergic reaction to iron, other medications, foods, dyes, or preservatives Pregnant or trying to get pregnant Breastfeeding How should I use this medication? This medication is for infusion into a vein. It is given in a hospital or clinic setting. Talk to your care team about the use of this medication in children. While this medication may be prescribed for children as young as 2 years for selected conditions, precautions do apply. Overdosage: If you think you have taken too much of this medicine contact a poison control center or emergency room at once. NOTE: This medicine is only for you. Do not share this medicine with others. What if I miss a dose? Keep appointments for follow-up doses. It is important not to miss your dose. Call your care team if you are unable to keep an appointment. What may interact with this medication? Do not take this medication with any of the following: Deferoxamine Dimercaprol Other iron products This medication may also interact with the following: Chloramphenicol Deferasirox This list may not describe all possible interactions. Give your health care provider a list of all the medicines, herbs, non-prescription drugs, or dietary supplements you use. Also tell them if you smoke,  drink alcohol, or use illegal drugs. Some items may interact with your medicine. What should I watch for while using this medication? Visit your care team regularly. Tell your care team if your symptoms do not start to get better or if they get worse. You may need blood work done while you are taking this medication. You may need to follow a special diet. Talk to your care team. Foods that contain iron include: whole grains/cereals, dried fruits, beans, or peas, leafy green vegetables, and organ meats (liver, kidney). What side effects may I notice from receiving this medication? Side effects that you should report to your care team as soon as possible: Allergic reactions--skin rash, itching, hives, swelling of the face, lips, tongue, or throat Low blood pressure--dizziness, feeling faint or lightheaded, blurry vision Shortness of breath Side effects that usually do not require medical attention (report to your care team if they continue or are bothersome): Flushing Headache Joint pain Muscle pain Nausea Pain, redness, or irritation at injection site This list may not describe all possible side effects. Call your doctor for medical advice about side effects. You may report side effects to FDA at 1-800-FDA-1088. Where should I keep my medication? This medication is given in a hospital or clinic. It will not be stored at home. NOTE: This sheet is a summary. It may not cover all possible information. If you have questions about this medicine, talk to your doctor, pharmacist, or health care provider.  2024 Elsevier/Gold Standard (2023-01-04 00:00:00)

## 2023-08-20 ENCOUNTER — Ambulatory Visit
Admission: RE | Admit: 2023-08-20 | Discharge: 2023-08-20 | Disposition: A | Discharge status: Home / Self Care | Payer: Medicare HMO | Source: Ambulatory Visit | Attending: Physician Assistant | Admitting: Physician Assistant

## 2023-08-20 ENCOUNTER — Other Ambulatory Visit: Payer: Self-pay | Admitting: Physician Assistant

## 2023-08-20 DIAGNOSIS — J90 Pleural effusion, not elsewhere classified: Secondary | ICD-10-CM

## 2023-08-20 DIAGNOSIS — R188 Other ascites: Secondary | ICD-10-CM | POA: Insufficient documentation

## 2023-08-20 NOTE — Progress Notes (Signed)
 Ellouise Console, PA-C [address]  [address]  [place], [place] [address]  Main: [phone]  Fax: [phone]   Primary Care Physician: [name], MD  Primary Gastroenterologist:  Ellouise Console, PA-C   CC: F/U Anemia, Ascites, Cirrhosis, CKD, CAD  HPI: [name] is a 78 y.o. male returns for 3 week Followup.  He continues to have worsening shortness of breath with laying supine and with walking.  08/20/23: CT Chest / Abd / Pelvis w/o CM: 1. Moderate-sized left pleural effusion and large right pleural effusion. Overlying atelectasis and moderate fluid in the left major fissure. 2. Several enlarged mediastinal and hilar lymph nodes which could be related to the patient's lung findings and pleural effusions. 3. Cirrhotic changes involving the liver. No hepatic lesions are identified without contrast. 4. Small/moderate volume abdominal/pelvic ascites. 5. Gallbladder wall thickening and pericholecystic fluid likely due to the patient's ascites and cirrhosis. 6. Advanced atherosclerotic calcification.  No aneurysm. Aortic Atherosclerosis    Patient has had worsening anemia in the past 7 months.  History of anemia of chronic disease and iron  deficiency anemia.  Has received IV iron  through Dr. Melanee hematologist.  Patient has had some dark soft loose stools.  He denies bright red rectal bleeding.  He denies constipation, abdominal pain, heartburn, dysphagia, nausea, or vomiting.  His weight has been up and down due to excessive fluid retention, CHF, extremity edema, changing diuretics.  He admits to moderate abdominal and lower extremity edema.  Nuclear Medicine Myocardial Perfusion Scan 08/08/2023: 1.  Normal left ventricular function 2.  Normal wall motion 3.  Mild to moderate anterolateral wall ischemia  Last OV w/ Cardiologist Dr. Nanci Odis Blanch at [facility] 07/30/23.  12/2022 Echo: LVEF 60-65%.  F/U CHF and CAD.  3 mth f/u.   07/30/2023 labs: Hemoglobin 8.1, hematocrit 25, MCV  100, platelet 130, BUN 53, creatinine 2.7, GFR 24, glucose 123.  Vitamin B12 of 597. 04/2023: Hemoglobin 9.9 12/2022: Hemoglobin 11.5   05/2021: Colonoscopy: Good prep, normal ileocolonic anastomosis, One 4 mm tubular adenoma and One 6 mm hyperplastic polyp removed from transverse colon.  Internal hemorrhoids.  5-year repeat colonoscopy recommended (due 05/2026).   09/2020: EGD by Dr. [name]: Multiple hyperplastic gastric polyps.  5 actively oozing polyps were removed, 1 with mucosal resection, and 4 with saline lift polypectomy.  Clips were placed.  Widely patent Schatzki's ring.  2 cm hiatal hernia.  Pathology of gastric polyps showed benign hyperplastic polyps.  Negative for intestinal metaplasia or dysplasia.   08/2020 colonoscopy by Dr. Janalyn: Fair prep, hemorrhoids, 1 small 6 mm tubular adenoma polyp removed from sigmoid colon.   08/2020 EGD by Dr. Janalyn: Normal esophagus and duodenum.  Multiple large gastric polyps.  Biopsies showed hyperplastic and reactive foveolar hyperplasia.  No dysplasia or malignancy.  Negative for H. pylori.   Last saw Dr. Janalyn in our office 03/2021 to follow-up iron  deficiency anemia and constipation.  Had a large hyperplastic gastric polyps removed by Dr. [name].  History of GERD, hiatal hernia and Schatzki's ring.  Currently on Dexilant 60 Mg daily.  History of right partial colectomy in 2013.   Has anemia of chronic kidney disease.  Followed by Nephrology. History of CAD, A-fib, CHF, aortic stenosis.  Currently on Eliquis  and Plavix .  History of GI bleed in 2022.   Admitted to hospital on 12/2022 due to acute respiratory failure with hypoxia and acute on chronic congestive heart failure. PMH significant for acute on chronic CHF,  CAD, S/P CABG, S/P PCI and stent, hypertension, hyperlipidemia, OSA, diabetes, GERD, hypothyroidism, and obesity.   Last saw cardiology 07/30/2023.  He has been having some left-sided chest pain and increased shortness of  breath.    Current Outpatient Medications  Medication Sig Dispense Refill   acetaminophen  (TYLENOL ) 500 MG tablet Take 2 tablets (1,000 mg total) by mouth every 6 (six) hours as needed for mild pain.     amLODipine  (NORVASC ) 5 MG tablet Take 5 mg by mouth 2 (two) times daily.     apixaban  (ELIQUIS ) 5 MG TABS tablet Take 1 tablet (5 mg total) by mouth 2 (two) times daily. 60 tablet 0   clopidogrel  (PLAVIX ) 75 MG tablet Take 75 mg by mouth daily.     cyanocobalamin  (VITAMIN B12) 1000 MCG tablet Take 1,000 mcg by mouth daily.     dexlansoprazole (DEXILANT) 60 MG capsule Take 1 capsule by mouth daily.     Ferrous Sulfate  (IRON ) 325 (65 Fe) MG TABS Take 1 tablet by mouth daily.     hydrALAZINE  (APRESOLINE ) 50 MG tablet Take 1 tablet by mouth 3 (three) times daily.     labetalol  (NORMODYNE ) 100 MG tablet Take 1 tablet by mouth 2 (two) times daily.     metFORMIN (GLUCOPHAGE-XR) 500 MG 24 hr tablet Take 500 mg by mouth 2 (two) times daily.     rosuvastatin  (CRESTOR ) 40 MG tablet Take 40 mg by mouth daily.     sitaGLIPtin (JANUVIA) 100 MG tablet Take 100 mg by mouth daily.     Testosterone  1.62 % GEL Apply 2 Pump topically daily. One pump on each arm 225 g 3   torsemide  (DEMADEX ) 20 MG tablet Take 1 tablet (20 mg total) by mouth 2 (two) times daily.     No current facility-administered medications for this visit.    Allergies as of 08/21/2023 - Review Complete 08/21/2023  Allergen Reaction Noted   Isosorbide  Cough 11/14/2016    Past Medical History:  Diagnosis Date   (HFpEF) heart failure with preserved ejection fraction (HCC) 07/25/2016   a.)TTE 07/25/16: EF >55, triv-mild pan regur, mild AS, G2DD; b.)TTE 09/02/16: EF >55, triv TR, mild AS; c.)TTE 11/05/16: EF >55, LAE, triv-mild pan regur, mild AS, G2DD; d.)TTE 01/21/20: EF >55, mod LVH, LAE/RVE, triv-mild AR/PR/TR, mod MR, mild AS, G2DD; e.)TTE 09/24/22: EF>55, sev LVH, sev LAE, RAE, mild PR, mod AR/MR/TR, mild-mod AS, RVSP 74.7; f.)TTE 12/23/22:  EF 60-65, LAE, mild-mod MR/TR, mild AS   Abnormal urine 05/01/2023   Anemia    Aortic atherosclerosis (HCC)    Aortic stenosis 07/25/2016   a.) TTE 07/25/2016: mild (MPG 13.5); b.) TTE 09/02/2016: mild (MPG 16); c.) TTE 11/05/2016: mild (MPG 9); d.) TTE 01/21/2020: mild (MPG 11.7); e.) TTE 09/24/2022: mild-mod (MPG 20.3); f.) TTE 12/23/2022: mild (MPG 15)   Arthritis    Atrial fibrillation (HCC)    a.) CHA2DS2VASc = 6 (age x2, CHF, HTN, vascular disease history, T2DM);  b.) rate/rhythm maintained on oral labetalol ; chronically anticoagulated with apixaban  + clopidogrel    AV block, 1st degree    B12 deficiency    CKD (chronic kidney disease), stage III (HCC)    Colon polyps    Congestive heart failure (HCC) 05/01/2023   Coronary artery disease 08/16/2016   a.) s/p 3v CABG 08/30/2016; b.) s/p PCI 11/26/2016 (DES x 1 oSVG-RCA); c.) s/p PCI 12/17/2016 (DES x 5 --> mLCx x2, dLCx, pLAD, dLAD); c.) s/p PCI 06/09/2020 (DES x1 --> ISR oSVG-PDA)   Cyst of  kidney, acquired 07/03/2023   Dyspnea    Gastroesophageal reflux disease 05/01/2023   GERD (gastroesophageal reflux disease)    Hepatic steatosis    History of 2019 novel coronavirus disease (COVID-19) 09/02/2020   a.) Tx'd with remdesivir    History of blood transfusion    History of GI bleed 09/02/2020   a.) admitted to [facility] 09/02/2020 - 09/08/2020   History of heart artery stent 11/26/2016   TOTAL of 7 stents: a.) 11/26/2016 --> 3.5 x 22 mm Resolute Integrity oSVG-RCA; b.) 12/17/2016: 3.0 x 12 mm Xience Alpine dLCx, 3.5 x 22 mm Resolute Onyx mLCx, 3.5 x 18 mm Resolute Onyx mLCx, 3.0 x 26 mm Resolute Onyx pLAD, 2.0 x 26 mm Resolute Onyx dLAD; c.) 06/09/2020 --> 2.5 x 18 mm Resolute Onyx ISR oSVG-PDA   History of partial colectomy 08/12/2012   a.) large gastric hyperplastic polyp removal   HLD (hyperlipidemia)    Hypertension    LBBB (left bundle branch block)    Long term current use of anticoagulant    a.) apixaban    Long term current use  of antithrombotics/antiplatelets    a.) clopidogrel    Male hypogonadism    a.) on exogenous TRT (1.62% gel)   Mild cardiomegaly    Mild gynecomastia (bilateral)    Nephrolithiasis    OSA (obstructive sleep apnea)    a.) does not utilize nocturnal PAP therapy   Personal history of surgery to heart and great vessels, presenting hazards to health 05/01/2023   Proteinuria, unspecified 08/01/2023   Pulmonary hypertension (HCC) 09/24/2022   a.) TTE 09/24/2022: RVSP 74.7; b.) TTE 12/23/2022: RVSP 31.3   RBBB (right bundle branch block with left anterior fascicular block)    Right inguinal hernia    Right thyroid  nodule    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   a.) LIMA-LAD, SVG-PDA, SVG-OM3   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/01/2023   Type 2 diabetes mellitus treated with insulin  (HCC)    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   Umbilical hernia    Unstable angina (HCC)    Vitamin D  deficiency     Past Surgical History:  Procedure Laterality Date   APPENDECTOMY     CARDIAC CATHETERIZATION Left 08/16/2016   Procedure: Left Heart Cath and Coronary Angiography;  Surgeon: Cara JONETTA Lovelace, MD;  Location: [facility];  Service: Cardiovascular;  Laterality: Left;   COLONOSCOPY N/A 09/07/2020   Procedure: COLONOSCOPY;  Surgeon: Janalyn Keene NOVAK, MD;  Location: [facility] ENDOSCOPY;  Service: Endoscopy;  Laterality: N/A;   COLONOSCOPY WITH PROPOFOL  N/A 05/17/2021   Procedure: COLONOSCOPY WITH PROPOFOL ;  Surgeon: Janalyn Keene NOVAK, MD;  Location: [facility] ENDOSCOPY;  Service: Endoscopy;  Laterality: N/A;   CORONARY ANGIOPLASTY WITH STENT PLACEMENT Left 11/26/2016   Procedure: CORONARY ANGIOPLASTY WITH STENT PLACEMENT; Location: [facility]; Surgeon: Alm Dais, MD   CORONARY ARTERY BYPASS GRAFT N/A 08/30/2016   Procedure: CORONARY ARTERY BYPASS GRAFT; Location: [facility]; Surgeon: Juliene Pouch, MD   CORONARY STENT INTERVENTION N/A 06/09/2020   Procedure:  CORONARY STENT INTERVENTION;  Surgeon: Lovelace Cara JONETTA, MD;  Location: [facility];  Service: Cardiovascular;  Laterality: N/A;   CORONARY STENT INTERVENTION Left 12/17/2016   Procedure: CORONARY STENT INTERVENTION; Location: [facility]; Surgeon: Alm Dais, MD   ENDOSCOPIC MUCOSAL RESECTION N/A 09/22/2020   Procedure: ENDOSCOPIC MUCOSAL RESECTION;  Surgeon: [name] Aloha Raddle., MD;  Location: [facility] ENDOSCOPY;  Service: Gastroenterology;  Laterality: N/A;   ESOPHAGOGASTRODUODENOSCOPY N/A 09/07/2020   Procedure:  ESOPHAGOGASTRODUODENOSCOPY (EGD);  Surgeon: Janalyn Keene NOVAK, MD;  Location: [facility] ENDOSCOPY;  Service: Endoscopy;  Laterality: N/A;   ESOPHAGOGASTRODUODENOSCOPY (EGD) WITH PROPOFOL  N/A 09/22/2020   Procedure: ESOPHAGOGASTRODUODENOSCOPY (EGD) WITH PROPOFOL ;  Surgeon: [name] Aloha Raddle., MD;  Location: [facility] ENDOSCOPY;  Service: Gastroenterology;  Laterality: N/A;   FRACTURE SURGERY     HEMOSTASIS CLIP PLACEMENT  09/22/2020   Procedure: HEMOSTASIS CLIP PLACEMENT;  Surgeon: [name] Aloha Raddle., MD;  Location: [facility] ENDOSCOPY;  Service: Gastroenterology;;   HEMOSTASIS CONTROL  09/22/2020   Procedure: HEMOSTASIS CONTROL;  Surgeon: [name] Aloha Raddle., MD;  Location: [facility] ENDOSCOPY;  Service: Gastroenterology;;   INSERTION OF MESH  02/19/2023   Procedure: INSERTION OF MESH;  Surgeon: Desiderio Schanz, MD;  Location: [facility] OR[facility];  Service: General;;  umbilical   LAPAROSCOPIC PARTIAL RIGHT COLECTOMY Right 08/12/2012   Procedure: LAPAROSCOPIC PARTIAL RIGHT COLECTOMY; Location: [facility]; Surgeon: Unknown Sharps, MD   LEFT HEART CATH AND CORONARY ANGIOGRAPHY Left 06/09/2020   Procedure: LEFT HEART CATH AND CORONARY ANGIOGRAPHY;  Surgeon: Florencio Cara BIRCH, MD;  Location: [facility];  Service: Cardiovascular;  Laterality: Left;   LEFT HEART CATH AND CORS/GRAFTS ANGIOGRAPHY Left 11/20/2016   Procedure: Left Heart Cath and Cors/Grafts Angiography;  Surgeon: Cara BIRCH Florencio, MD;   Location: [facility];  Service: Cardiovascular;  Laterality: Left;   POLYPECTOMY  09/22/2020   Procedure: POLYPECTOMY;  Surgeon: Mansouraty, Aloha Raddle., MD;  Location: [facility] ENDOSCOPY;  Service: Gastroenterology;;   SUBMUCOSAL LIFTING INJECTION  09/22/2020   Procedure: SUBMUCOSAL LIFTING INJECTION;  Surgeon: [name] Aloha Raddle., MD;  Location: [facility] ENDOSCOPY;  Service: Gastroenterology;;   UMBILICAL HERNIA REPAIR N/A 02/19/2023   Procedure: HERNIA REPAIR UMBILICAL ADULT, open;  Surgeon: Desiderio Schanz, MD;  Location: [facility] ORS;  Service: General;  Laterality: N/A;   XI ROBOTIC ASSISTED INGUINAL HERNIA REPAIR WITH MESH Right 02/19/2023   Procedure: XI ROBOTIC ASSISTED INGUINAL HERNIA REPAIR WITH MESH;  Surgeon: Desiderio Schanz, MD;  Location: [facility] ORS;  Service: General;  Laterality: Right;    Review of Systems:    All systems reviewed and negative except where noted in HPI.   Physical Examination:   BP (!) 160/73   Pulse 71   Temp 97.7 F (36.5 C)   Ht 6' 1 (1.854 m)   Wt 219 lb 12.8 oz (99.7 kg)   BMI 29.00 kg/m   General: Chronically ill appearing male; Poor Skin color; Walks with a cane.  Lungs: Labored.  Short of breath at rest and laying supine.  Decreased breath sounds.  No wheezes, rales or rhonchi. Heart: Regular rate and rhythm, 2/6 Systolic murmur; No rubs or gallops.  Abdomen: Bowel sounds are normal; Abdomen is Soft; No hepatosplenomegaly, masses or hernias;  No Abdominal Tenderness; No guarding or rebound tenderness. Neuro: Walks with a Cane.  Appears weak.   Psych: Alert and cooperative, normal mood and affect. Responds to questions appropriately. Skin:  Color is pale, ashen, grayish.   Imaging Studies: CT CHEST ABDOMEN PELVIS WO CONTRAST Result Date: 08/20/2023 CLINICAL DATA:  Follow-up pleural effusions and abdominal ascites. EXAM: CT CHEST, ABDOMEN AND PELVIS WITHOUT CONTRAST TECHNIQUE: Multidetector CT imaging of the chest, abdomen and pelvis was performed  following the standard protocol without IV contrast. RADIATION DOSE REDUCTION: This exam was performed according to the departmental dose-optimization program which includes automated exposure control, adjustment of the mA and/or kV according to patient size and/or use of iterative reconstruction technique. COMPARISON:  Abdominal ultrasound 08/06/2023 FINDINGS: CT CHEST FINDINGS Cardiovascular: The heart  is mildly enlarged. The aorta is normal in caliber. Scattered atherosclerotic calcifications. Three-vessel coronary artery calcifications and surgical changes from prior coronary artery bypass surgery. Mediastinum/Nodes: Several enlarged mediastinal and hilar lymph nodes which could be related to the patient's lung findings and pleural effusions. The esophagus is grossly normal. There is a 2.3 cm right thyroid  lobe lesion. This has been evaluated on previous imaging. (ref: J Am Coll Radiol. 2015 Feb;12(2): 143-50). Lungs/Pleura: Moderate-sized left pleural effusion and large right pleural effusion. Overlying atelectasis and moderate fluid in the left major fissure. No pulmonary edema. No worrisome pulmonary lesions. Musculoskeletal: Advanced degenerative changes involving the thoracic spine. Median sternotomy wires related to prior heart surgery. CT ABDOMEN PELVIS FINDINGS Hepatobiliary: Suspect cirrhotic changes involving the liver with irregular hepatic contour, dilated hepatic fissures and increased caudate to right lobe ratio. No hepatic lesions are identified without contrast peer small calcified granuloma noted in the right lobe. The gallbladder is moderately contracted. There is associated gallbladder wall thickening and pericholecystic fluid likely due to the patient's ascites. No biliary dilatation. Pancreas: No mass, inflammation or ductal dilatation. Spleen: Within normal limits in size.  No splenic lesions. Adrenals/Urinary Tract: The adrenal glands are normal. Simple bilateral renal cysts and bilateral  renal calculi not requiring any further imaging evaluation or follow-up. The bladder is unremarkable. Stomach/Bowel: The stomach, duodenum, small bowel and colon grossly normal. Evidence of prior colonic surgery. Vascular/Lymphatic: Advanced atherosclerotic calcification involving the aorta, iliac arteries and branch vessels but no aneurysm. Borderline mesenteric and retroperitoneal lymph nodes likely related to the patient's ascites and cirrhosis. Reproductive: The prostate gland and seminal vesicles are unremarkable. Other: Small/moderate volume abdominal/pelvic ascites. Musculoskeletal: No significant bony findings. Advanced degenerative changes involving the spine and moderate bilateral hip joint degenerative changes. IMPRESSION: 1. Moderate-sized left pleural effusion and large right pleural effusion. Overlying atelectasis and moderate fluid in the left major fissure. 2. Several enlarged mediastinal and hilar lymph nodes which could be related to the patient's lung findings and pleural effusions. 3. Cirrhotic changes involving the liver. No hepatic lesions are identified without contrast. 4. Small/moderate volume abdominal/pelvic ascites. 5. Gallbladder wall thickening and pericholecystic fluid likely due to the patient's ascites and cirrhosis. 6. Advanced atherosclerotic calcification.  No aneurysm. Aortic Atherosclerosis (ICD10-I70.0). Electronically Signed   By: MYRTIS Stammer M.D.   On: 08/20/2023 14:48   US  ABDOMEN LIMITED RUQ (LIVER/GB) Result Date: 08/06/2023 CLINICAL DATA:  Evaluate for ascites.  Elevated LFTs. EXAM: ULTRASOUND ABDOMEN LIMITED RIGHT UPPER QUADRANT COMPARISON:  Ultrasound abdomen 09/19/2022 FINDINGS: Gallbladder: Multiple gallbladder polyps measuring up to 8 mm. Small stones in the gallbladder lumen. No gallbladder wall thickening or pericholecystic fluid. Common bile duct: Diameter: 3.1 mm Liver: Mild coarsened hepatic parenchymal echogenicity. No focal lesion. Portal vein is patent  on color Doppler imaging with normal direction of blood flow towards the liver. Other: Bilateral pleural effusions. Upper abdominal ascites. 2.1 cm cyst exophytic superior pole right kidney. No imaging follow-up needed. IMPRESSION: 1. Upper abdominal ascites. 2. Bilateral pleural effusions. 3. Cholelithiasis without secondary signs of acute cholecystitis. 4. Multiple gallbladder polyps measuring up to 8 mm. Recommend follow-up ultrasound in 6 months. Electronically Signed   By: Bard Moats M.D.   On: 08/06/2023 09:06    Assessment and Plan:   TEX CONROY is a 78 y.o. y/o male returns for f/u of:   Bilateral pleural effusion with shortness of breath.  Enlarged mediastinal lymph nodes. I discussed case with Dr. Unk who help me decide patient's plan of  care today.  We are recommending patient go to the ED for further evaluation of worsening shortness of breath and bilateral pleural effusions.  Likely needs inpatient pulmonary workup.  He has multiple organ dysfunction (heart, kidneys, lung, liver).  He has NOT seen a pulmonologist.   2.  Anemia: Iron  Deficiency and Anemia of Chronic Disease 3. Cirrhosis, Newly diagnosed, Mild: MELD Score 18 4.  Mild Ascites 5.  Chronic Kidney Disease - Folowed by Nephrology  6.  History of upper GI bleed with IDA in 2022 attributed to multiple large hyperplastic gastric polyps removed by Dr. [name].  No dysplasia or malignancy.   7.  History of GI bleed and recent dark stools; Currently on PO Iron  every day. Stable Hgb 8.1g, MCV 100, Plt 130 (checked 07/30/23).             Continue Dexilant 60 mg daily.             Continue to avoid all NSAIDS.             Needs repeat EGD; Cannot schedule until he is stable.             Needs cardiac clearance and permission to hold Eliquis  and Plavix  before we can schedule EGD with sedation.               8.  Multiple comorbidities: CKD 4, CAD, CHF, A-fib, on Eliquis  and Plavix .  Nuclear medicine stress test  08/08/2023: Normal LVEF, Mild to moderate anterolateral wall ischemia. 12/2022 ECHO: LVEF 60-65%.  Followed by Cardiologist Dr. Tobie, and Nephrologist.     Ellouise Console, PA-C  Follow up in 4 weeks with Dr. Therisa or Dr. Unk.

## 2023-08-21 ENCOUNTER — Encounter: Payer: Self-pay | Admitting: Physician Assistant

## 2023-08-21 ENCOUNTER — Ambulatory Visit: Payer: Medicare HMO | Admitting: Physician Assistant

## 2023-08-21 VITALS — BP 160/73 | HR 71 | Temp 97.7°F | Ht 73.0 in | Wt 219.8 lb

## 2023-08-21 DIAGNOSIS — J90 Pleural effusion, not elsewhere classified: Secondary | ICD-10-CM

## 2023-08-21 DIAGNOSIS — K7460 Unspecified cirrhosis of liver: Secondary | ICD-10-CM

## 2023-08-21 DIAGNOSIS — D509 Iron deficiency anemia, unspecified: Secondary | ICD-10-CM | POA: Diagnosis not present

## 2023-08-21 DIAGNOSIS — D638 Anemia in other chronic diseases classified elsewhere: Secondary | ICD-10-CM

## 2023-08-21 DIAGNOSIS — K921 Melena: Secondary | ICD-10-CM

## 2023-08-21 DIAGNOSIS — Z87891 Personal history of nicotine dependence: Secondary | ICD-10-CM

## 2023-08-21 DIAGNOSIS — R0602 Shortness of breath: Secondary | ICD-10-CM

## 2023-08-21 DIAGNOSIS — R195 Other fecal abnormalities: Secondary | ICD-10-CM

## 2023-08-21 DIAGNOSIS — Z8719 Personal history of other diseases of the digestive system: Secondary | ICD-10-CM

## 2023-08-21 DIAGNOSIS — R188 Other ascites: Secondary | ICD-10-CM

## 2023-08-21 NOTE — Progress Notes (Signed)
 I discussed CT results with patient at office visit today.  Patient was sent to the ED for worsening shortness of breath, bilateral pleural effusions with enlarged mediastinal lymph nodes.  Patient was notified about mild cirrhosis and ascites. Ellouise Console, PA-C

## 2023-08-21 NOTE — Patient Instructions (Signed)
 Bilateral pleural effusion with shortness of breath.    Enlarged mediastinal lymph nodes.   I discussed patient's case with Dr. Unk who help me decide patient's plan of care today.   We are recommending patient go to the ED for further evaluation of shortness of breath, bilateral pleural effusions, and enlarged mediastinal lymph nodes.  Likely needs inpatient workup.  He is not stable or safe to undergo sedation or GI procedures given current health status.

## 2023-08-22 ENCOUNTER — Emergency Department: Payer: Medicare HMO

## 2023-08-22 ENCOUNTER — Other Ambulatory Visit: Payer: Self-pay

## 2023-08-22 ENCOUNTER — Inpatient Hospital Stay
Admission: EM | Admit: 2023-08-22 | Discharge: 2023-08-29 | DRG: 291 | Disposition: A | Discharge status: Home / Self Care | Payer: Medicare HMO | Attending: Internal Medicine | Admitting: Internal Medicine

## 2023-08-22 DIAGNOSIS — E1122 Type 2 diabetes mellitus with diabetic chronic kidney disease: Secondary | ICD-10-CM | POA: Diagnosis present

## 2023-08-22 DIAGNOSIS — R0603 Acute respiratory distress: Secondary | ICD-10-CM | POA: Diagnosis present

## 2023-08-22 DIAGNOSIS — I2510 Atherosclerotic heart disease of native coronary artery without angina pectoris: Secondary | ICD-10-CM | POA: Diagnosis present

## 2023-08-22 DIAGNOSIS — G4730 Sleep apnea, unspecified: Secondary | ICD-10-CM | POA: Diagnosis present

## 2023-08-22 DIAGNOSIS — D631 Anemia in chronic kidney disease: Secondary | ICD-10-CM | POA: Diagnosis present

## 2023-08-22 DIAGNOSIS — J948 Other specified pleural conditions: Secondary | ICD-10-CM | POA: Diagnosis not present

## 2023-08-22 DIAGNOSIS — Z8719 Personal history of other diseases of the digestive system: Secondary | ICD-10-CM | POA: Diagnosis not present

## 2023-08-22 DIAGNOSIS — Z860100 Personal history of colon polyps, unspecified: Secondary | ICD-10-CM

## 2023-08-22 DIAGNOSIS — K7460 Unspecified cirrhosis of liver: Secondary | ICD-10-CM | POA: Diagnosis present

## 2023-08-22 DIAGNOSIS — E785 Hyperlipidemia, unspecified: Secondary | ICD-10-CM | POA: Diagnosis present

## 2023-08-22 DIAGNOSIS — I480 Paroxysmal atrial fibrillation: Secondary | ICD-10-CM | POA: Diagnosis present

## 2023-08-22 DIAGNOSIS — Z794 Long term (current) use of insulin: Secondary | ICD-10-CM | POA: Diagnosis not present

## 2023-08-22 DIAGNOSIS — Z7984 Long term (current) use of oral hypoglycemic drugs: Secondary | ICD-10-CM

## 2023-08-22 DIAGNOSIS — E039 Hypothyroidism, unspecified: Secondary | ICD-10-CM | POA: Diagnosis present

## 2023-08-22 DIAGNOSIS — D649 Anemia, unspecified: Principal | ICD-10-CM | POA: Diagnosis present

## 2023-08-22 DIAGNOSIS — I4891 Unspecified atrial fibrillation: Secondary | ICD-10-CM | POA: Diagnosis not present

## 2023-08-22 DIAGNOSIS — Z87891 Personal history of nicotine dependence: Secondary | ICD-10-CM | POA: Diagnosis not present

## 2023-08-22 DIAGNOSIS — I5033 Acute on chronic diastolic (congestive) heart failure: Secondary | ICD-10-CM | POA: Diagnosis present

## 2023-08-22 DIAGNOSIS — N189 Chronic kidney disease, unspecified: Secondary | ICD-10-CM | POA: Diagnosis not present

## 2023-08-22 DIAGNOSIS — J90 Pleural effusion, not elsewhere classified: Secondary | ICD-10-CM | POA: Diagnosis present

## 2023-08-22 DIAGNOSIS — K922 Gastrointestinal hemorrhage, unspecified: Secondary | ICD-10-CM

## 2023-08-22 DIAGNOSIS — I10 Essential (primary) hypertension: Secondary | ICD-10-CM | POA: Diagnosis present

## 2023-08-22 DIAGNOSIS — R188 Other ascites: Secondary | ICD-10-CM

## 2023-08-22 DIAGNOSIS — Z87442 Personal history of urinary calculi: Secondary | ICD-10-CM

## 2023-08-22 DIAGNOSIS — I25810 Atherosclerosis of coronary artery bypass graft(s) without angina pectoris: Secondary | ICD-10-CM | POA: Diagnosis present

## 2023-08-22 DIAGNOSIS — Z6827 Body mass index (BMI) 27.0-27.9, adult: Secondary | ICD-10-CM

## 2023-08-22 DIAGNOSIS — E66813 Obesity, class 3: Secondary | ICD-10-CM | POA: Diagnosis present

## 2023-08-22 DIAGNOSIS — I071 Rheumatic tricuspid insufficiency: Secondary | ICD-10-CM | POA: Diagnosis present

## 2023-08-22 DIAGNOSIS — D62 Acute posthemorrhagic anemia: Secondary | ICD-10-CM | POA: Diagnosis present

## 2023-08-22 DIAGNOSIS — D696 Thrombocytopenia, unspecified: Secondary | ICD-10-CM | POA: Diagnosis present

## 2023-08-22 DIAGNOSIS — K219 Gastro-esophageal reflux disease without esophagitis: Secondary | ICD-10-CM | POA: Diagnosis present

## 2023-08-22 DIAGNOSIS — K317 Polyp of stomach and duodenum: Secondary | ICD-10-CM | POA: Diagnosis not present

## 2023-08-22 DIAGNOSIS — Z888 Allergy status to other drugs, medicaments and biological substances status: Secondary | ICD-10-CM

## 2023-08-22 DIAGNOSIS — I452 Bifascicular block: Secondary | ICD-10-CM | POA: Diagnosis present

## 2023-08-22 DIAGNOSIS — I440 Atrioventricular block, first degree: Secondary | ICD-10-CM | POA: Diagnosis present

## 2023-08-22 DIAGNOSIS — I130 Hypertensive heart and chronic kidney disease with heart failure and stage 1 through stage 4 chronic kidney disease, or unspecified chronic kidney disease: Secondary | ICD-10-CM | POA: Diagnosis present

## 2023-08-22 DIAGNOSIS — R636 Underweight: Secondary | ICD-10-CM | POA: Diagnosis present

## 2023-08-22 DIAGNOSIS — E1169 Type 2 diabetes mellitus with other specified complication: Secondary | ICD-10-CM | POA: Diagnosis present

## 2023-08-22 DIAGNOSIS — I350 Nonrheumatic aortic (valve) stenosis: Secondary | ICD-10-CM | POA: Diagnosis present

## 2023-08-22 DIAGNOSIS — E8770 Fluid overload, unspecified: Secondary | ICD-10-CM | POA: Diagnosis not present

## 2023-08-22 DIAGNOSIS — Z7901 Long term (current) use of anticoagulants: Secondary | ICD-10-CM

## 2023-08-22 DIAGNOSIS — Z8616 Personal history of COVID-19: Secondary | ICD-10-CM

## 2023-08-22 DIAGNOSIS — I272 Pulmonary hypertension, unspecified: Secondary | ICD-10-CM | POA: Diagnosis present

## 2023-08-22 DIAGNOSIS — T502X5A Adverse effect of carbonic-anhydrase inhibitors, benzothiadiazides and other diuretics, initial encounter: Secondary | ICD-10-CM | POA: Diagnosis not present

## 2023-08-22 DIAGNOSIS — N1832 Chronic kidney disease, stage 3b: Secondary | ICD-10-CM | POA: Diagnosis present

## 2023-08-22 DIAGNOSIS — N179 Acute kidney failure, unspecified: Secondary | ICD-10-CM | POA: Diagnosis not present

## 2023-08-22 DIAGNOSIS — Z7902 Long term (current) use of antithrombotics/antiplatelets: Secondary | ICD-10-CM

## 2023-08-22 DIAGNOSIS — I5031 Acute diastolic (congestive) heart failure: Secondary | ICD-10-CM | POA: Diagnosis not present

## 2023-08-22 DIAGNOSIS — Z9049 Acquired absence of other specified parts of digestive tract: Secondary | ICD-10-CM

## 2023-08-22 DIAGNOSIS — K921 Melena: Secondary | ICD-10-CM

## 2023-08-22 DIAGNOSIS — Z79899 Other long term (current) drug therapy: Secondary | ICD-10-CM

## 2023-08-22 DIAGNOSIS — K760 Fatty (change of) liver, not elsewhere classified: Secondary | ICD-10-CM | POA: Diagnosis present

## 2023-08-22 DIAGNOSIS — G4733 Obstructive sleep apnea (adult) (pediatric): Secondary | ICD-10-CM | POA: Diagnosis present

## 2023-08-22 DIAGNOSIS — I509 Heart failure, unspecified: Secondary | ICD-10-CM

## 2023-08-22 DIAGNOSIS — E876 Hypokalemia: Secondary | ICD-10-CM | POA: Diagnosis present

## 2023-08-22 DIAGNOSIS — Z9889 Other specified postprocedural states: Secondary | ICD-10-CM

## 2023-08-22 DIAGNOSIS — Z955 Presence of coronary angioplasty implant and graft: Secondary | ICD-10-CM

## 2023-08-22 HISTORY — DX: Cardiac murmur, unspecified: R01.1

## 2023-08-22 LAB — FOLATE: Folate: 8.9 ng/mL (ref >5.9)

## 2023-08-22 LAB — IRON AND TIBC
Iron: 51 ug/dL (ref 45–182)
Saturation Ratios: 14 % — ABNORMAL LOW (ref 17.9–39.5)
TIBC: 371 ug/dL (ref 250–450)
UIBC: 320 ug/dL

## 2023-08-22 LAB — RETICULOCYTES
Immature Retic Fract: 36.1 % — ABNORMAL HIGH (ref 2.3–15.9)
RBC.: 2.14 MIL/uL — ABNORMAL LOW (ref 4.22–5.81)
Retic Count, Absolute: 80.9 10*3/uL (ref 19.0–186.0)
Retic Ct Pct: 3.8 % — ABNORMAL HIGH (ref 0.4–3.1)

## 2023-08-22 LAB — BASIC METABOLIC PANEL
Anion gap: 12 (ref 5–15)
BUN: 41 mg/dL — ABNORMAL HIGH (ref 8–23)
CO2: 20 mmol/L — ABNORMAL LOW (ref 22–32)
Calcium: 10.3 mg/dL (ref 8.9–10.3)
Chloride: 104 mmol/L (ref 98–111)
Creatinine, Ser: 2.48 mg/dL — ABNORMAL HIGH (ref 0.61–1.24)
GFR, Estimated: 26 mL/min — ABNORMAL LOW (ref >60)
Glucose, Bld: 140 mg/dL — ABNORMAL HIGH (ref 70–99)
Potassium: 4.5 mmol/L (ref 3.5–5.1)
Sodium: 136 mmol/L (ref 135–145)

## 2023-08-22 LAB — HEMOGLOBIN AND HEMATOCRIT, BLOOD
HCT: 22.5 % — ABNORMAL LOW (ref 39.0–52.0)
HCT: 24.2 % — ABNORMAL LOW (ref 39.0–52.0)
Hemoglobin: 7.2 g/dL — ABNORMAL LOW (ref 13.0–17.0)
Hemoglobin: 7.7 g/dL — ABNORMAL LOW (ref 13.0–17.0)

## 2023-08-22 LAB — CBC
HCT: 22.2 % — ABNORMAL LOW (ref 39.0–52.0)
Hemoglobin: 6.9 g/dL — ABNORMAL LOW (ref 13.0–17.0)
MCH: 32.7 pg (ref 26.0–34.0)
MCHC: 31.1 g/dL (ref 30.0–36.0)
MCV: 105.2 fL — ABNORMAL HIGH (ref 80.0–100.0)
Platelets: 103 10*3/uL — ABNORMAL LOW (ref 150–400)
RBC: 2.11 MIL/uL — ABNORMAL LOW (ref 4.22–5.81)
RDW: 18.6 % — ABNORMAL HIGH (ref 11.5–15.5)
WBC: 4.2 10*3/uL (ref 4.0–10.5)
nRBC: 0.5 % — ABNORMAL HIGH (ref 0.0–0.2)

## 2023-08-22 LAB — TROPONIN I (HIGH SENSITIVITY)
Troponin I (High Sensitivity): 23 ng/L — ABNORMAL HIGH (ref <18)
Troponin I (High Sensitivity): 24 ng/L — ABNORMAL HIGH (ref <18)

## 2023-08-22 LAB — BRAIN NATRIURETIC PEPTIDE: B Natriuretic Peptide: 1069.2 pg/mL — ABNORMAL HIGH (ref 0.0–100.0)

## 2023-08-22 LAB — VITAMIN B12: Vitamin B-12: 497 pg/mL (ref 180–914)

## 2023-08-22 LAB — FERRITIN: Ferritin: 125 ng/mL (ref 24–336)

## 2023-08-22 LAB — PREPARE RBC (CROSSMATCH)

## 2023-08-22 MED ORDER — APIXABAN 5 MG PO TABS: 5.0000 mg | ORAL_TABLET | Freq: Two times a day (BID) | ORAL | Status: DC

## 2023-08-22 MED ORDER — SODIUM CHLORIDE 0.9% FLUSH
3.0000 mL | Freq: Two times a day (BID) | INTRAVENOUS | Status: DC
  Administered 2023-08-22 – 2023-08-29 (×15): 3 mL via INTRAVENOUS

## 2023-08-22 MED ORDER — PANTOPRAZOLE SODIUM 40 MG IV SOLR
40.0000 mg | Freq: Two times a day (BID) | INTRAVENOUS | Status: DC
  Administered 2023-08-22 – 2023-08-29 (×15): 40 mg via INTRAVENOUS
  Filled 2023-08-22 (×15): qty 10

## 2023-08-22 MED ORDER — SODIUM CHLORIDE 0.9 % IV SOLN
10.0000 mL/h | Freq: Once | INTRAVENOUS | Status: AC
  Administered 2023-08-22: 10 mL/h via INTRAVENOUS

## 2023-08-22 MED ORDER — FUROSEMIDE 10 MG/ML IJ SOLN
60.0000 mg | Freq: Two times a day (BID) | INTRAMUSCULAR | Status: DC
  Administered 2023-08-22 – 2023-08-23 (×2): 60 mg via INTRAVENOUS
  Filled 2023-08-22 (×3): qty 8

## 2023-08-22 MED ORDER — SODIUM CHLORIDE 0.9% FLUSH: 3.0000 mL | INTRAVENOUS | Status: DC | PRN

## 2023-08-22 MED ORDER — FUROSEMIDE 10 MG/ML IJ SOLN
60.0000 mg | Freq: Once | INTRAMUSCULAR | Status: AC
  Administered 2023-08-22: 60 mg via INTRAVENOUS
  Filled 2023-08-22: qty 8

## 2023-08-22 MED ORDER — ONDANSETRON HCL 4 MG PO TABS: 4.0000 mg | ORAL_TABLET | Freq: Four times a day (QID) | ORAL | Status: DC | PRN

## 2023-08-22 MED ORDER — ONDANSETRON HCL 4 MG/2ML IJ SOLN: 4.0000 mg | Freq: Four times a day (QID) | INTRAMUSCULAR | Status: DC | PRN

## 2023-08-22 MED ORDER — ROSUVASTATIN CALCIUM 10 MG PO TABS
40.0000 mg | ORAL_TABLET | Freq: Every day | ORAL | Status: DC
  Administered 2023-08-22 – 2023-08-29 (×8): 40 mg via ORAL
  Filled 2023-08-22 (×2): qty 4
  Filled 2023-08-22: qty 2
  Filled 2023-08-22 (×2): qty 4
  Filled 2023-08-22: qty 2
  Filled 2023-08-22 (×2): qty 4

## 2023-08-22 MED ORDER — SODIUM CHLORIDE 0.9 % IV SOLN: 250.0000 mL | INTRAVENOUS | Status: AC | PRN

## 2023-08-22 NOTE — ED Triage Notes (Signed)
 Pt presents to ED with c/o of SOB, pt states CHF. Pt speaking in full sentences at this time. Pt states worse on exertion. NAD noted.

## 2023-08-22 NOTE — Assessment & Plan Note (Signed)
 Resume statin once able to eat diet

## 2023-08-22 NOTE — Assessment & Plan Note (Addendum)
 Noted moderate R and large L pleural effusion on CT imaging 08/19/2022 in setting of acute on chronic HFpEF  Noted active diuresis  Will also reach out to IR for potential thoracentesis

## 2023-08-22 NOTE — Assessment & Plan Note (Signed)
 Baseline hx/o upper GI bleeding w/ recent evaluation 08/20/2022 w/ outpatient GI recommending EGD pending cardiology clearance  Hgb downtrending from base hgb 9-10 to 6.9 today  Worsened dark stools over past few months in setting of chronic iron  use  IV PPI  Hold anticoagulation, antiplatelet regimen and NSAIDs.  Pending pRBC transfusion  Trend hgb  Transfuse for hgb <7  Monitor

## 2023-08-22 NOTE — Assessment & Plan Note (Signed)
 CPAP.

## 2023-08-22 NOTE — H&P (Addendum)
 History and Physical    Patient: [name] [name] DOB: October 04, 1945 DOA: 08/22/2023 DOS: the patient was seen and examined on 08/22/2023 PCP: [name], MD  Patient coming from: Home  Chief Complaint:  Chief Complaint  Patient presents with   Shortness of Breath   HPI: [name] is a 78 y.o. male with medical history significant of CAD s/p CABG and DES, severe pulmonary hypertension, hypertension, hyperlipidemia, OSA, type 2 diabetes, hypothyroidism presenting w/ acute on chronic HFpEF, upper GIB, acute blood loss anemia, acute on chronic kidney disease. Pt reports worsening fatigue over the past 3-4+ months. Noted worsening orthopnea, PND, LE swelling. Followed by [facility] cardiology. Minimal chest pain. Has been compliant w/ home cardiac medication regimen. Denies any NSAID or excessive salt intake. Noted prior hx/o GIB and anemia. On iron  chronically. Was evaluated by GI on 08/19/2022. Noted concern for upper GI bleeding. Baseline hgb 10. Hgb 8 during that evaluation. Recommendation was for EGD pending cardiology evaulation in setting of HFpEF among other issues. Pt states that stools are chronically dark. Seems to have been more dark over the past several weeks. No abd pain or diarrhea. Appetite relatively stable.  Presented to ER afebrile, hemodynamically stable. Pertinent labs. Hgb 6.9, MCV 105, plt 103, trop 20s x 2. Cr 2.5 (baseline around 1.8).  Review of Systems: As mentioned in the history of present illness. All other systems reviewed and are negative. Past Medical History:  Diagnosis Date   (HFpEF) heart failure with preserved ejection fraction (HCC) 07/25/2016   a.)TTE 07/25/16: EF >55, triv-mild pan regur, mild AS, G2DD; b.)TTE 09/02/16: EF >55, triv TR, mild AS; c.)TTE 11/05/16: EF >55, LAE, triv-mild pan regur, mild AS, G2DD; d.)TTE 01/21/20: EF >55, mod LVH, LAE/RVE, triv-mild AR/PR/TR, mod MR, mild AS, G2DD; e.)TTE 09/24/22: EF>55, sev LVH, sev LAE, RAE, mild PR, mod  AR/MR/TR, mild-mod AS, RVSP 74.7; f.)TTE 12/23/22: EF 60-65, LAE, mild-mod MR/TR, mild AS   Abnormal urine 05/01/2023   Anemia    Aortic atherosclerosis (HCC)    Aortic stenosis 07/25/2016   a.) TTE 07/25/2016: mild (MPG 13.5); b.) TTE 09/02/2016: mild (MPG 16); c.) TTE 11/05/2016: mild (MPG 9); d.) TTE 01/21/2020: mild (MPG 11.7); e.) TTE 09/24/2022: mild-mod (MPG 20.3); f.) TTE 12/23/2022: mild (MPG 15)   Arthritis    Atrial fibrillation (HCC)    a.) CHA2DS2VASc = 6 (age x2, CHF, HTN, vascular disease history, T2DM);  b.) rate/rhythm maintained on oral labetalol ; chronically anticoagulated with apixaban  + clopidogrel    AV block, 1st degree    B12 deficiency    CKD (chronic kidney disease), stage III (HCC)    Colon polyps    Congestive heart failure (HCC) 05/01/2023   Coronary artery disease 08/16/2016   a.) s/p 3v CABG 08/30/2016; b.) s/p PCI 11/26/2016 (DES x 1 oSVG-RCA); c.) s/p PCI 12/17/2016 (DES x 5 --> mLCx x2, dLCx, pLAD, dLAD); c.) s/p PCI 06/09/2020 (DES x1 --> ISR oSVG-PDA)   Cyst of kidney, acquired 07/03/2023   Dyspnea    Gastroesophageal reflux disease 05/01/2023   GERD (gastroesophageal reflux disease)    Hepatic steatosis    History of 2019 novel coronavirus disease (COVID-19) 09/02/2020   a.) Tx'd with remdesivir    History of blood transfusion    History of GI bleed 09/02/2020   a.) admitted to [facility] 09/02/2020 - 09/08/2020   History of heart artery stent 11/26/2016   TOTAL of 7 stents: a.) 11/26/2016 --> 3.5 x 22 mm Resolute Integrity oSVG-RCA; b.) 12/17/2016:  3.0 x 12 mm Xience Alpine dLCx, 3.5 x 22 mm Resolute Onyx mLCx, 3.5 x 18 mm Resolute Onyx mLCx, 3.0 x 26 mm Resolute Onyx pLAD, 2.0 x 26 mm Resolute Onyx dLAD; c.) 06/09/2020 --> 2.5 x 18 mm Resolute Onyx ISR oSVG-PDA   History of partial colectomy 08/12/2012   a.) large gastric hyperplastic polyp removal   HLD (hyperlipidemia)    Hypertension    LBBB (left bundle branch block)    Long term current use of  anticoagulant    a.) apixaban    Long term current use of antithrombotics/antiplatelets    a.) clopidogrel    Male hypogonadism    a.) on exogenous TRT (1.62% gel)   Mild cardiomegaly    Mild gynecomastia (bilateral)    Nephrolithiasis    OSA (obstructive sleep apnea)    a.) does not utilize nocturnal PAP therapy   Personal history of surgery to heart and great vessels, presenting hazards to health 05/01/2023   Proteinuria, unspecified 08/01/2023   Pulmonary hypertension (HCC) 09/24/2022   a.) TTE 09/24/2022: RVSP 74.7; b.) TTE 12/23/2022: RVSP 31.3   RBBB (right bundle branch block with left anterior fascicular block)    Right inguinal hernia    Right thyroid  nodule    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   a.) LIMA-LAD, SVG-PDA, SVG-OM3   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/01/2023   Type 2 diabetes mellitus treated with insulin  (HCC)    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   Umbilical hernia    Unstable angina (HCC)    Vitamin D  deficiency    Past Surgical History:  Procedure Laterality Date   APPENDECTOMY     CARDIAC CATHETERIZATION Left 08/16/2016   Procedure: Left Heart Cath and Coronary Angiography;  Surgeon: Cara JONETTA Lovelace, MD;  Location: [facility];  Service: Cardiovascular;  Laterality: Left;   COLONOSCOPY N/A 09/07/2020   Procedure: COLONOSCOPY;  Surgeon: Janalyn Keene NOVAK, MD;  Location: [facility] ENDOSCOPY;  Service: Endoscopy;  Laterality: N/A;   COLONOSCOPY WITH PROPOFOL  N/A 05/17/2021   Procedure: COLONOSCOPY WITH PROPOFOL ;  Surgeon: Janalyn Keene NOVAK, MD;  Location: [facility] ENDOSCOPY;  Service: Endoscopy;  Laterality: N/A;   CORONARY ANGIOPLASTY WITH STENT PLACEMENT Left 11/26/2016   Procedure: CORONARY ANGIOPLASTY WITH STENT PLACEMENT; Location: [facility]; Surgeon: Alm Dais, MD   CORONARY ARTERY BYPASS GRAFT N/A 08/30/2016   Procedure: CORONARY ARTERY BYPASS GRAFT; Location: [facility]; Surgeon: Juliene Pouch, MD   CORONARY  STENT INTERVENTION N/A 06/09/2020   Procedure: CORONARY STENT INTERVENTION;  Surgeon: Lovelace Cara JONETTA, MD;  Location: [facility];  Service: Cardiovascular;  Laterality: N/A;   CORONARY STENT INTERVENTION Left 12/17/2016   Procedure: CORONARY STENT INTERVENTION; Location: [facility]; Surgeon: Alm Dais, MD   ENDOSCOPIC MUCOSAL RESECTION N/A 09/22/2020   Procedure: ENDOSCOPIC MUCOSAL RESECTION;  Surgeon: [name] Aloha Raddle., MD;  Location: [facility] ENDOSCOPY;  Service: Gastroenterology;  Laterality: N/A;   ESOPHAGOGASTRODUODENOSCOPY N/A 09/07/2020   Procedure: ESOPHAGOGASTRODUODENOSCOPY (EGD);  Surgeon: Janalyn Keene NOVAK, MD;  Location: [facility] ENDOSCOPY;  Service: Endoscopy;  Laterality: N/A;   ESOPHAGOGASTRODUODENOSCOPY (EGD) WITH PROPOFOL  N/A 09/22/2020   Procedure: ESOPHAGOGASTRODUODENOSCOPY (EGD) WITH PROPOFOL ;  Surgeon: [name] Aloha Raddle., MD;  Location: [facility] ENDOSCOPY;  Service: Gastroenterology;  Laterality: N/A;   FRACTURE SURGERY     HEMOSTASIS CLIP PLACEMENT  09/22/2020   Procedure: HEMOSTASIS CLIP PLACEMENT;  Surgeon: [name] Aloha Raddle., MD;  Location: [facility] ENDOSCOPY;  Service: Gastroenterology;;   HEMOSTASIS CONTROL  09/22/2020   Procedure: HEMOSTASIS  CONTROL;  Surgeon: [name] Aloha Raddle., MD;  Location: [facility] ENDOSCOPY;  Service: Gastroenterology;;   INSERTION OF MESH  02/19/2023   Procedure: INSERTION OF MESH;  Surgeon: Desiderio Schanz, MD;  Location: [facility] ORS;  Service: General;;  umbilical   LAPAROSCOPIC PARTIAL RIGHT COLECTOMY Right 08/12/2012   Procedure: LAPAROSCOPIC PARTIAL RIGHT COLECTOMY; Location: [facility]; Surgeon: Unknown Sharps, MD   LEFT HEART CATH AND CORONARY ANGIOGRAPHY Left 06/09/2020   Procedure: LEFT HEART CATH AND CORONARY ANGIOGRAPHY;  Surgeon: Florencio Cara BIRCH, MD;  Location: [facility];  Service: Cardiovascular;  Laterality: Left;   LEFT HEART CATH AND CORS/GRAFTS ANGIOGRAPHY Left 11/20/2016   Procedure: Left Heart Cath and Cors/Grafts  Angiography;  Surgeon: Cara BIRCH Florencio, MD;  Location: [facility];  Service: Cardiovascular;  Laterality: Left;   POLYPECTOMY  09/22/2020   Procedure: POLYPECTOMY;  Surgeon: Mansouraty, Aloha Raddle., MD;  Location: [facility] ENDOSCOPY;  Service: Gastroenterology;;   SUBMUCOSAL LIFTING INJECTION  09/22/2020   Procedure: SUBMUCOSAL LIFTING INJECTION;  Surgeon: [name] Aloha Raddle., MD;  Location: [facility] ENDOSCOPY;  Service: Gastroenterology;;   UMBILICAL HERNIA REPAIR N/A 02/19/2023   Procedure: HERNIA REPAIR UMBILICAL ADULT, open;  Surgeon: Desiderio Schanz, MD;  Location: [facility] ORS;  Service: General;  Laterality: N/A;   XI ROBOTIC ASSISTED INGUINAL HERNIA REPAIR WITH MESH Right 02/19/2023   Procedure: XI ROBOTIC ASSISTED INGUINAL HERNIA REPAIR WITH MESH;  Surgeon: Desiderio Schanz, MD;  Location: [facility] ORS;  Service: General;  Laterality: Right;   Social History:  reports that he has quit smoking. His smoking use included cigarettes. He has never been exposed to tobacco smoke. He has never used smokeless tobacco. He reports that he does not drink alcohol and does not use drugs.  Allergies  Allergen Reactions   Isosorbide  Cough    History reviewed. No pertinent family history.  Prior to Admission medications   Medication Sig Start Date End Date Taking? Authorizing Provider  acetaminophen  (TYLENOL ) 500 MG tablet Take 2 tablets (1,000 mg total) by mouth every 6 (six) hours as needed for mild pain. 02/19/23   Desiderio Schanz, MD  amLODipine  (NORVASC ) 5 MG tablet Take 5 mg by mouth 2 (two) times daily. 07/03/23 07/02/24  [provider]  apixaban  (ELIQUIS ) 5 MG TABS tablet Take 1 tablet (5 mg total) by mouth 2 (two) times daily. 12/27/22   Laurita Pillion, MD  clopidogrel  (PLAVIX ) 75 MG tablet Take 75 mg by mouth daily.    [provider]  cyanocobalamin  (VITAMIN B12) 1000 MCG tablet Take 1,000 mcg by mouth daily.    [provider]  dexlansoprazole (DEXILANT) 60 MG capsule Take 1  capsule by mouth daily. 12/25/21   [provider]  Ferrous Sulfate  (IRON ) 325 (65 Fe) MG TABS Take 1 tablet by mouth daily.    [provider]  hydrALAZINE  (APRESOLINE ) 50 MG tablet Take 1 tablet by mouth 3 (three) times daily. 05/10/21   [provider]  labetalol  (NORMODYNE ) 100 MG tablet Take 1 tablet by mouth 2 (two) times daily. 06/27/22   [provider]  metFORMIN (GLUCOPHAGE-XR) 500 MG 24 hr tablet Take 500 mg by mouth 2 (two) times daily.    [provider]  rosuvastatin  (CRESTOR ) 40 MG tablet Take 40 mg by mouth daily. 06/22/20   [provider]  sitaGLIPtin (JANUVIA) 100 MG tablet Take 100 mg by mouth daily.    [provider]  Testosterone  1.62 % GEL Apply 2 Pump topically daily. One pump on each arm 07/22/23  Helon Kirsch A, PA-C  torsemide  (DEMADEX ) 20 MG tablet Take 1 tablet (20 mg total) by mouth 2 (two) times daily. 12/30/22 12/30/23  Laurita Pillion, MD    Physical Exam: Vitals:   08/22/23 0727 08/22/23 0729  BP:  130/73  Pulse:  66  Resp:  18  Temp:  97.7 F (36.5 C)  TempSrc:  Oral  SpO2:  95%  Weight: 99 kg   Height: 6' 1 (1.854 m)    Physical Exam Constitutional:      Appearance: He is obese.  HENT:     Head: Normocephalic and atraumatic.     Nose: Nose normal.     Mouth/Throat:     Mouth: Mucous membranes are moist.  Eyes:     Pupils: Pupils are equal, round, and reactive to light.  Cardiovascular:     Rate and Rhythm: Normal rate and regular rhythm.  Pulmonary:     Effort: Pulmonary effort is normal.     Breath sounds: Stridor present.  Abdominal:     General: Bowel sounds are normal.     Comments: Obese abdomen    Musculoskeletal:        General: Normal range of motion.     Right lower leg: Edema present.     Left lower leg: Edema present.  Skin:    General: Skin is warm.  Neurological:     General: No focal deficit present.  Psychiatric:        Mood and Affect: Mood normal.      Data Reviewed:  There are no new results to review at this time.  DG Chest 2 View CLINICAL DATA:  78 year old male with shortness of breath, fatigue.  EXAM: CHEST - 2 VIEW  COMPARISON:  CT Chest, Abdomen, and Pelvis 08/20/2023 and earlier.  FINDINGS: PA and lateral views 0743 hours today. Stable cardiomegaly and mediastinal contours. Prior CABG. Ongoing bilateral pleural effusions, moderate and slightly larger on the right. No pneumothorax. Bilateral pulmonary vascular congestion is relatively mild, mild interstitial edema is possible. No air bronchograms. Overall ventilation not significantly changed from recent CT. No acute osseous abnormality identified. Negative visible bowel gas.  IMPRESSION: Bilateral pleural effusions and interstitial edema. Ventilation not significantly changed from CT two days ago.  Electronically Signed   By: VEAR Hurst M.D.   On: 08/22/2023 07:59  Lab Results  Component Value Date   WBC 4.2 08/22/2023   HGB 6.9 (L) 08/22/2023   HCT 22.2 (L) 08/22/2023   MCV 105.2 (H) 08/22/2023   PLT 103 (L) 08/22/2023   Last metabolic panel Lab Results  Component Value Date   GLUCOSE 140 (H) 08/22/2023   NA 136 08/22/2023   K 4.5 08/22/2023   CL 104 08/22/2023   CO2 20 (L) 08/22/2023   BUN 41 (H) 08/22/2023   CREATININE 2.48 (H) 08/22/2023   GFRNONAA 26 (L) 08/22/2023   CALCIUM  10.3 08/22/2023   PROT 6.9 08/01/2023   ALBUMIN 4.3 08/01/2023   LABGLOB 2.7 09/03/2020   AGRATIO 1.4 09/03/2020   BILITOT 0.4 08/01/2023   ALKPHOS 115 08/01/2023   AST 18 08/01/2023   ALT 19 08/01/2023   ANIONGAP 12 08/22/2023    Assessment and Plan: Upper GI bleeding Baseline hx/o upper GI bleeding w/ recent evaluation 08/20/2022 w/ outpatient GI recommending EGD pending cardiology clearance  Hgb downtrending from base hgb 9-10 to 6.9 today  Worsened dark stools over past few months in setting of chronic iron  use  IV PPI  Hold anticoagulation, antiplatelet  regimen and NSAIDs.  Pending pRBC transfusion  Trend hgb  Transfuse for hgb <7  Monitor     Acute heart failure with preserved ejection fraction (HFpEF) (HCC) 2D ECHO 12/2022 w/ EF 60-65%  + worsening orthopnea, PND, LE swelling and fatigue  BNP 1070  CXR w/ bilateral pleural effusions and interstitial edema  Noted symptomatic anemia w/ hgb 6.9 likely confounding issue  IV diuresis started in ER  Weight 99kg today  Strict Is and Os and daily weights  Cardiology consult as clinically indicated    CAD (coronary artery disease), native coronary artery Baseline CAD  No active CP  Trop neg  EKG rate controlled afib  Cont home regimen- hold antiplatelet regimen in setting of concern for upper GI bleeding    Pleural effusion Noted moderate R and large L pleural effusion on CT imaging 08/19/2022 in setting of acute on chronic HFpEF  Noted active diuresis  Will also reach out to IR for potential paracentesis    Acute kidney injury superimposed on chronic kidney disease (HCC) Baseline Cr 1.4-1.7 w/ GFR in 40s  Cr 2.46 today w/ GFR in the 20s  ? Component of cardiorenal syndrome  Monitor renal function w/ pRBC transfusion and diuresis    Paroxysmal atrial fibrillation (HCC) Rate controlled afib on EKG  Hold eliquis  in setting of concern for upper GIB    Acute blood loss anemia Baseline hgb 9-10 w/ hgb 6.9 today w/ concern for upper GI bleeding  Pending pRBC transfusion  Trend hgb  Transfuse for hgb <7  Check anemia panel     Hyperlipidemia associated with type 2 diabetes mellitus (HCC) Resume statin once able to eat diet     OSA (obstructive sleep apnea) CPAP    Essential hypertension BP stable  Monitor w/ diuresis     Greater than 50% was spent in counseling and coordination of care with patient Total encounter time 80 minutes or more    Advance Care Planning:   Code Status: Full Code   Consults: GI, Cardiology, IR   Family Communication: Son at the  bedside   Severity of Illness: The appropriate patient status for this patient is INPATIENT. Inpatient status is judged to be reasonable and necessary in order to provide the required intensity of service to ensure the patient's safety. The patient's presenting symptoms, physical exam findings, and initial radiographic and laboratory data in the context of their chronic comorbidities is felt to place them at high risk for further clinical deterioration. Furthermore, it is not anticipated that the patient will be medically stable for discharge from the hospital within 2 midnights of admission.   * I certify that at the point of admission it is my clinical judgment that the patient will require inpatient hospital care spanning beyond 2 midnights from the point of admission due to high intensity of service, high risk for further deterioration and high frequency of surveillance required.*  Author: Elspeth JINNY Masters, MD 08/22/2023 11:32 AM  For on call review [email or web address].

## 2023-08-22 NOTE — Assessment & Plan Note (Signed)
 2D ECHO 12/2022 w/ EF 60-65%  + worsening orthopnea, PND, LE swelling and fatigue  BNP 1070  CXR w/ bilateral pleural effusions and interstitial edema  Noted symptomatic anemia w/ hgb 6.9 likely confounding issue  IV diuresis started in ER  Weight 99kg today  Strict Is and Os and daily weights  Cardiology consult as clinically indicated

## 2023-08-22 NOTE — Consult Note (Signed)
 [name], MD [facility]  [address], [address] [place], [place] [address] Phone: [phone] Fax : [phone]  Consultation  Referring Provider:     Dr. Eldonna Primary Care Physician:  [name], MD Primary Gastroenterologist: [name]         Reason for Consultation:     GI bleed  Date of Admission:  08/22/2023 Date of Consultation:  08/22/2023         HPI:   [name] is a 78 y.o. male who was seen in our office by nurse practitioner yesterday with a history of a MELD score of 18 with mild ascites and iron  deficiency with anemia of chronic disease.  The patient was found to have bilateral pleural effusions with shortness of breath and worsening shortness of breath the last few months.  There was a enlarged mediastinal lymph node on recent imaging and the patient was recommended to go to the ED for evaluation of worsening shortness of breath.  The patient has had a history of a right hemicolectomy with his last colonoscopy in 2022.  At that time the patient had 2 small polyps removed from the transverse colon.  Patient also has a history of multiple gastric polyps with referral to Dr. [name] in [place] for bleeding from these polyps and 5 of the largest ones that appear to be the culprits were removed at that time.  There was a concern of a possible upper GI bleed with the patient's most recent labs showing:  Component     Latest Ref Rng 07/19/2023 07/23/2023 08/22/2023  Hemoglobin     13.0 - 17.0 g/dL 8.9 (L)  8.1 (L)  6.9 (L)   HCT     39.0 - 52.0 % 27.7 (L)  25.5 (L)  22.2 (L)   MCV     80.0 - 100.0 fL  100.4 (H)  105.2 (H)    The patient denies any alcohol abuse.  The patient's iron  studies a month ago showed a iron  saturation low at 16 with the other iron  indices being normal.  The ferritin was normal.  The repeat saturation from today was 14.  Past Medical History:  Diagnosis Date   (HFpEF) heart failure with preserved ejection fraction (HCC) 07/25/2016    a.)TTE 07/25/16: EF >55, triv-mild pan regur, mild AS, G2DD; b.)TTE 09/02/16: EF >55, triv TR, mild AS; c.)TTE 11/05/16: EF >55, LAE, triv-mild pan regur, mild AS, G2DD; d.)TTE 01/21/20: EF >55, mod LVH, LAE/RVE, triv-mild AR/PR/TR, mod MR, mild AS, G2DD; e.)TTE 09/24/22: EF>55, sev LVH, sev LAE, RAE, mild PR, mod AR/MR/TR, mild-mod AS, RVSP 74.7; f.)TTE 12/23/22: EF 60-65, LAE, mild-mod MR/TR, mild AS   Abnormal urine 05/01/2023   Anemia    Aortic atherosclerosis (HCC)    Aortic stenosis 07/25/2016   a.) TTE 07/25/2016: mild (MPG 13.5); b.) TTE 09/02/2016: mild (MPG 16); c.) TTE 11/05/2016: mild (MPG 9); d.) TTE 01/21/2020: mild (MPG 11.7); e.) TTE 09/24/2022: mild-mod (MPG 20.3); f.) TTE 12/23/2022: mild (MPG 15)   Arthritis    Atrial fibrillation (HCC)    a.) CHA2DS2VASc = 6 (age x2, CHF, HTN, vascular disease history, T2DM);  b.) rate/rhythm maintained on oral labetalol ; chronically anticoagulated with apixaban  + clopidogrel    AV block, 1st degree    B12 deficiency    CKD (chronic kidney disease), stage III (HCC)    Colon polyps    Congestive heart failure (HCC) 05/01/2023   Coronary artery disease 08/16/2016   a.)  s/p 3v CABG 08/30/2016; b.) s/p PCI 11/26/2016 (DES x 1 oSVG-RCA); c.) s/p PCI 12/17/2016 (DES x 5 --> mLCx x2, dLCx, pLAD, dLAD); c.) s/p PCI 06/09/2020 (DES x1 --> ISR oSVG-PDA)   Cyst of kidney, acquired 07/03/2023   Dyspnea    Gastroesophageal reflux disease 05/01/2023   GERD (gastroesophageal reflux disease)    Hepatic steatosis    History of 2019 novel coronavirus disease (COVID-19) 09/02/2020   a.) Tx'd with remdesivir    History of blood transfusion    History of GI bleed 09/02/2020   a.) admitted to [facility] 09/02/2020 - 09/08/2020   History of heart artery stent 11/26/2016   TOTAL of 7 stents: a.) 11/26/2016 --> 3.5 x 22 mm Resolute Integrity oSVG-RCA; b.) 12/17/2016: 3.0 x 12 mm Xience Alpine dLCx, 3.5 x 22 mm Resolute Onyx mLCx, 3.5 x 18 mm Resolute Onyx mLCx, 3.0 x 26  mm Resolute Onyx pLAD, 2.0 x 26 mm Resolute Onyx dLAD; c.) 06/09/2020 --> 2.5 x 18 mm Resolute Onyx ISR oSVG-PDA   History of partial colectomy 08/12/2012   a.) large gastric hyperplastic polyp removal   HLD (hyperlipidemia)    Hypertension    LBBB (left bundle branch block)    Long term current use of anticoagulant    a.) apixaban    Long term current use of antithrombotics/antiplatelets    a.) clopidogrel    Male hypogonadism    a.) on exogenous TRT (1.62% gel)   Mild cardiomegaly    Mild gynecomastia (bilateral)    Nephrolithiasis    OSA (obstructive sleep apnea)    a.) does not utilize nocturnal PAP therapy   Personal history of surgery to heart and great vessels, presenting hazards to health 05/01/2023   Proteinuria, unspecified 08/01/2023   Pulmonary hypertension (HCC) 09/24/2022   a.) TTE 09/24/2022: RVSP 74.7; b.) TTE 12/23/2022: RVSP 31.3   RBBB (right bundle branch block with left anterior fascicular block)    Right inguinal hernia    Right thyroid  nodule    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   a.) LIMA-LAD, SVG-PDA, SVG-OM3   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/01/2023   Type 2 diabetes mellitus treated with insulin  (HCC)    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   Umbilical hernia    Unstable angina (HCC)    Vitamin D  deficiency     Past Surgical History:  Procedure Laterality Date   APPENDECTOMY     CARDIAC CATHETERIZATION Left 08/16/2016   Procedure: Left Heart Cath and Coronary Angiography;  Surgeon: Cara JONETTA Lovelace, MD;  Location: [facility];  Service: Cardiovascular;  Laterality: Left;   COLONOSCOPY N/A 09/07/2020   Procedure: COLONOSCOPY;  Surgeon: Janalyn Keene NOVAK, MD;  Location: [facility] ENDOSCOPY;  Service: Endoscopy;  Laterality: N/A;   COLONOSCOPY WITH PROPOFOL  N/A 05/17/2021   Procedure: COLONOSCOPY WITH PROPOFOL ;  Surgeon: Janalyn Keene NOVAK, MD;  Location: [facility] ENDOSCOPY;  Service: Endoscopy;   Laterality: N/A;   CORONARY ANGIOPLASTY WITH STENT PLACEMENT Left 11/26/2016   Procedure: CORONARY ANGIOPLASTY WITH STENT PLACEMENT; Location: [facility]; Surgeon: Alm Dais, MD   CORONARY ARTERY BYPASS GRAFT N/A 08/30/2016   Procedure: CORONARY ARTERY BYPASS GRAFT; Location: [facility]; Surgeon: Juliene Pouch, MD   CORONARY STENT INTERVENTION N/A 06/09/2020   Procedure: CORONARY STENT INTERVENTION;  Surgeon: Lovelace Cara JONETTA, MD;  Location: [facility];  Service: Cardiovascular;  Laterality: N/A;   CORONARY STENT INTERVENTION Left 12/17/2016   Procedure: CORONARY STENT INTERVENTION; Location: [facility]; Surgeon: Alm Dais,  MD   ENDOSCOPIC MUCOSAL RESECTION N/A 09/22/2020   Procedure: ENDOSCOPIC MUCOSAL RESECTION;  Surgeon: [name] Aloha Raddle., MD;  Location: [facility] ENDOSCOPY;  Service: Gastroenterology;  Laterality: N/A;   ESOPHAGOGASTRODUODENOSCOPY N/A 09/07/2020   Procedure: ESOPHAGOGASTRODUODENOSCOPY (EGD);  Surgeon: Janalyn Keene NOVAK, MD;  Location: [facility] ENDOSCOPY;  Service: Endoscopy;  Laterality: N/A;   ESOPHAGOGASTRODUODENOSCOPY (EGD) WITH PROPOFOL  N/A 09/22/2020   Procedure: ESOPHAGOGASTRODUODENOSCOPY (EGD) WITH PROPOFOL ;  Surgeon: [name] Aloha Raddle., MD;  Location: [facility] ENDOSCOPY;  Service: Gastroenterology;  Laterality: N/A;   FRACTURE SURGERY     HEMOSTASIS CLIP PLACEMENT  09/22/2020   Procedure: HEMOSTASIS CLIP PLACEMENT;  Surgeon: [name] Aloha Raddle., MD;  Location: [facility] ENDOSCOPY;  Service: Gastroenterology;;   HEMOSTASIS CONTROL  09/22/2020   Procedure: HEMOSTASIS CONTROL;  Surgeon: [name] Aloha Raddle., MD;  Location: [facility] ENDOSCOPY;  Service: Gastroenterology;;   INSERTION OF MESH  02/19/2023   Procedure: INSERTION OF MESH;  Surgeon: Desiderio Schanz, MD;  Location: [facility] ORS;  Service: General;;  umbilical   LAPAROSCOPIC PARTIAL RIGHT COLECTOMY Right 08/12/2012   Procedure: LAPAROSCOPIC PARTIAL RIGHT COLECTOMY; Location: [facility]; Surgeon: Unknown Sharps, MD   LEFT HEART CATH AND  CORONARY ANGIOGRAPHY Left 06/09/2020   Procedure: LEFT HEART CATH AND CORONARY ANGIOGRAPHY;  Surgeon: Florencio Cara BIRCH, MD;  Location: [facility];  Service: Cardiovascular;  Laterality: Left;   LEFT HEART CATH AND CORS/GRAFTS ANGIOGRAPHY Left 11/20/2016   Procedure: Left Heart Cath and Cors/Grafts Angiography;  Surgeon: Cara BIRCH Florencio, MD;  Location: [facility];  Service: Cardiovascular;  Laterality: Left;   POLYPECTOMY  09/22/2020   Procedure: POLYPECTOMY;  Surgeon: Mansouraty, Aloha Raddle., MD;  Location: [facility] ENDOSCOPY;  Service: Gastroenterology;;   SUBMUCOSAL LIFTING INJECTION  09/22/2020   Procedure: SUBMUCOSAL LIFTING INJECTION;  Surgeon: [name] Aloha Raddle., MD;  Location: [facility] ENDOSCOPY;  Service: Gastroenterology;;   UMBILICAL HERNIA REPAIR N/A 02/19/2023   Procedure: HERNIA REPAIR UMBILICAL ADULT, open;  Surgeon: Desiderio Schanz, MD;  Location: [facility] ORS;  Service: General;  Laterality: N/A;   XI ROBOTIC ASSISTED INGUINAL HERNIA REPAIR WITH MESH Right 02/19/2023   Procedure: XI ROBOTIC ASSISTED INGUINAL HERNIA REPAIR WITH MESH;  Surgeon: Desiderio Schanz, MD;  Location: [facility] ORS;  Service: General;  Laterality: Right;    Prior to Admission medications   Medication Sig Start Date End Date Taking? Authorizing Provider  acetaminophen  (TYLENOL ) 500 MG tablet Take 2 tablets (1,000 mg total) by mouth every 6 (six) hours as needed for mild pain. 02/19/23   Desiderio Schanz, MD  amLODipine  (NORVASC ) 5 MG tablet Take 5 mg by mouth 2 (two) times daily. 07/03/23 07/02/24  [provider]  apixaban  (ELIQUIS ) 5 MG TABS tablet Take 1 tablet (5 mg total) by mouth 2 (two) times daily. 12/27/22   Laurita Pillion, MD  clopidogrel  (PLAVIX ) 75 MG tablet Take 75 mg by mouth daily.    [provider]  cyanocobalamin  (VITAMIN B12) 1000 MCG tablet Take 1,000 mcg by mouth daily.    [provider]  dexlansoprazole (DEXILANT) 60 MG capsule Take 1 capsule by mouth daily. 12/25/21    [provider]  Ferrous Sulfate  (IRON ) 325 (65 Fe) MG TABS Take 1 tablet by mouth daily.    [provider]  hydrALAZINE  (APRESOLINE ) 50 MG tablet Take 1 tablet by mouth 3 (three) times daily. 05/10/21   [provider]  labetalol  (NORMODYNE ) 100 MG tablet Take 1 tablet by mouth 2 (two) times daily. 06/27/22   [provider]  metFORMIN (GLUCOPHAGE-XR) 500 MG 24  hr tablet Take 500 mg by mouth 2 (two) times daily.    [provider]  rosuvastatin  (CRESTOR ) 40 MG tablet Take 40 mg by mouth daily. 06/22/20   [provider]  sitaGLIPtin (JANUVIA) 100 MG tablet Take 100 mg by mouth daily.    [provider]  Testosterone  1.62 % GEL Apply 2 Pump topically daily. One pump on each arm 07/22/23   McGowan, Clotilda A, PA-C  torsemide  (DEMADEX ) 20 MG tablet Take 1 tablet (20 mg total) by mouth 2 (two) times daily. 12/30/22 12/30/23  Laurita Pillion, MD    History reviewed. No pertinent family history.   Social History   Tobacco Use   Smoking status: Former    Types: Cigarettes    Passive exposure: Never   Smokeless tobacco: Never  Vaping Use   Vaping status: Never Used  Substance Use Topics   Alcohol use: No   Drug use: No    Allergies as of 08/22/2023 - Review Complete 08/22/2023  Allergen Reaction Noted   Isosorbide  Cough 11/14/2016    Review of Systems:    All systems reviewed and negative except where noted in HPI.   Physical Exam:  Vital signs in last 24 hours: Temp:  [97.4 F (36.3 C)-97.7 F (36.5 C)] 97.4 F (36.3 C) (01/09 1227) Pulse Rate:  [66-71] 66 (01/09 0729) Resp:  [18] 18 (01/09 0729) BP: (130-160)/(73) 130/73 (01/09 0729) SpO2:  [95 %] 95 % (01/09 0729) Weight:  [99 kg-99.7 kg] 99 kg (01/09 0727)   General:   Pleasant, cooperative in NAD Head:  Normocephalic and atraumatic. Eyes:   No icterus.   Conjunctiva pink. PERRLA. Ears:  Normal auditory acuity. Neck:  Supple; no masses or thyroidomegaly Lungs:  Respirations even and unlabored. Lungs clear to auscultation bilaterally.   No wheezes, crackles, or rhonchi.  Heart:  Regular rate and rhythm;  Without murmur, clicks, rubs or gallops Abdomen:  Soft, nondistended, nontender. Normal bowel sounds. No appreciable masses or hepatomegaly.  No rebound or guarding.  Rectal:  Not performed. Msk:  Symmetrical without gross deformities.    Extremities:  Without edema, cyanosis or clubbing. Neurologic:  Alert and oriented x3;  grossly normal neurologically. Skin:  Intact without significant lesions or rashes. Cervical Nodes:  No significant cervical adenopathy. Psych:  Alert and cooperative. Normal affect.  LAB RESULTS: Recent Labs    08/22/23 0730  WBC 4.2  HGB 6.9*  HCT 22.2*  PLT 103*   BMET Recent Labs    08/22/23 0730  NA 136  K 4.5  CL 104  CO2 20*  GLUCOSE 140*  BUN 41*  CREATININE 2.48*  CALCIUM  10.3   LFT No results for input(s): PROT, ALBUMIN, AST, ALT, ALKPHOS, BILITOT, BILIDIR, IBILI in the last 72 hours. PT/INR No results for input(s): LABPROT, INR in the last 72 hours.  STUDIES: DG Chest 2 View Result Date: 08/22/2023 CLINICAL DATA:  78 year old male with shortness of breath, fatigue. EXAM: CHEST - 2 VIEW COMPARISON:  CT Chest, Abdomen, and Pelvis 08/20/2023 and earlier. FINDINGS: PA and lateral views 0743 hours today. Stable cardiomegaly and mediastinal contours. Prior CABG. Ongoing bilateral pleural effusions, moderate and slightly larger on the right. No pneumothorax. Bilateral pulmonary vascular congestion is relatively mild, mild interstitial edema is possible. No air bronchograms. Overall ventilation not significantly changed from recent CT. No acute osseous abnormality identified. Negative visible bowel gas. IMPRESSION: Bilateral pleural effusions and interstitial edema. Ventilation not significantly changed from CT two days ago. Electronically Signed  By: VEAR Hurst M.D.   On: 08/22/2023 07:59    CT CHEST ABDOMEN PELVIS WO CONTRAST Result Date: 08/20/2023 CLINICAL DATA:  Follow-up pleural effusions and abdominal ascites. EXAM: CT CHEST, ABDOMEN AND PELVIS WITHOUT CONTRAST TECHNIQUE: Multidetector CT imaging of the chest, abdomen and pelvis was performed following the standard protocol without IV contrast. RADIATION DOSE REDUCTION: This exam was performed according to the departmental dose-optimization program which includes automated exposure control, adjustment of the mA and/or kV according to patient size and/or use of iterative reconstruction technique. COMPARISON:  Abdominal ultrasound 08/06/2023 FINDINGS: CT CHEST FINDINGS Cardiovascular: The heart is mildly enlarged. The aorta is normal in caliber. Scattered atherosclerotic calcifications. Three-vessel coronary artery calcifications and surgical changes from prior coronary artery bypass surgery. Mediastinum/Nodes: Several enlarged mediastinal and hilar lymph nodes which could be related to the patient's lung findings and pleural effusions. The esophagus is grossly normal. There is a 2.3 cm right thyroid  lobe lesion. This has been evaluated on previous imaging. (ref: J Am Coll Radiol. 2015 Feb;12(2): 143-50). Lungs/Pleura: Moderate-sized left pleural effusion and large right pleural effusion. Overlying atelectasis and moderate fluid in the left major fissure. No pulmonary edema. No worrisome pulmonary lesions. Musculoskeletal: Advanced degenerative changes involving the thoracic spine. Median sternotomy wires related to prior heart surgery. CT ABDOMEN PELVIS FINDINGS Hepatobiliary: Suspect cirrhotic changes involving the liver with irregular hepatic contour, dilated hepatic fissures and increased caudate to right lobe ratio. No hepatic lesions are identified without contrast peer small calcified granuloma noted in the right lobe. The gallbladder is moderately contracted. There is associated gallbladder wall thickening and pericholecystic fluid  likely due to the patient's ascites. No biliary dilatation. Pancreas: No mass, inflammation or ductal dilatation. Spleen: Within normal limits in size.  No splenic lesions. Adrenals/Urinary Tract: The adrenal glands are normal. Simple bilateral renal cysts and bilateral renal calculi not requiring any further imaging evaluation or follow-up. The bladder is unremarkable. Stomach/Bowel: The stomach, duodenum, small bowel and colon grossly normal. Evidence of prior colonic surgery. Vascular/Lymphatic: Advanced atherosclerotic calcification involving the aorta, iliac arteries and branch vessels but no aneurysm. Borderline mesenteric and retroperitoneal lymph nodes likely related to the patient's ascites and cirrhosis. Reproductive: The prostate gland and seminal vesicles are unremarkable. Other: Small/moderate volume abdominal/pelvic ascites. Musculoskeletal: No significant bony findings. Advanced degenerative changes involving the spine and moderate bilateral hip joint degenerative changes. IMPRESSION: 1. Moderate-sized left pleural effusion and large right pleural effusion. Overlying atelectasis and moderate fluid in the left major fissure. 2. Several enlarged mediastinal and hilar lymph nodes which could be related to the patient's lung findings and pleural effusions. 3. Cirrhotic changes involving the liver. No hepatic lesions are identified without contrast. 4. Small/moderate volume abdominal/pelvic ascites. 5. Gallbladder wall thickening and pericholecystic fluid likely due to the patient's ascites and cirrhosis. 6. Advanced atherosclerotic calcification.  No aneurysm. Aortic Atherosclerosis (ICD10-I70.0). Electronically Signed   By: MYRTIS Stammer M.D.   On: 08/20/2023 14:48      Impression / Plan:   Assessment: Principal Problem:   CHF exacerbation (HCC) Active Problems:   Essential hypertension   OSA (obstructive sleep apnea)   CAD (coronary artery disease), native coronary artery   Hyperlipidemia  associated with type 2 diabetes mellitus (HCC)   Acute blood loss anemia   Acute heart failure with preserved ejection fraction (HFpEF) (HCC)   Paroxysmal atrial fibrillation (HCC)   Acute kidney injury superimposed on chronic kidney disease (HCC)   Upper GI bleeding   Pleural effusion   Jerry  P Fitzgerald is a 78 y.o. y/o male with thrombocytopenia, normal liver enzymes 3 weeks ago with a INR of 1.2 at that same time.  The patient's albumin was normal.  The patient had an ultrasound at the end of last year that showed ascites with bilateral pleural effusions and cholelithiasis without choledocholithiasis or acute cholecystitis.  Patient states that his stools have been dark and he takes iron  but the stools have been soft more likely from melena than from the iron .  The patient also had multiple gastric polyps that were bleeding removed back in 2022 and this may also explain some of the patient's anemia.  Plan:  The patient will need to be stabilized from a pulmonary point of view with treatment of the fluid.  The patient may need an abdominal paracentesis to decrease the pleural effusions.  The patient will likely need an upper endoscopy at some time in the future when he has been optimized.    PPI IV twice daily  Continue serial CBCs and transfuse PRN Avoid NSAIDs Maintain 2 large-bore IV lines Please page GI with any acute hemodynamic changes, or signs of active GI bleeding  Patient has been explained the plan agrees with it.  Thank you for involving me in the care of this patient.      LOS: 0 days   [name], MD, [facility] 08/22/2023, 12:32 PM,  Pager [phone] 7am-5pm  Check AMION for 5pm -7am coverage and on weekends   Note: This dictation was prepared with Dragon dictation along with smaller phrase technology. Any transcriptional errors that result from this process are unintentional.

## 2023-08-22 NOTE — Assessment & Plan Note (Signed)
 Baseline Cr 1.4-1.7 w/ GFR in 40s  Cr 2.46 today w/ GFR in the 20s  ? Component of cardiorenal syndrome  Monitor renal function w/ pRBC transfusion and diuresis

## 2023-08-22 NOTE — Assessment & Plan Note (Signed)
 Baseline hgb 9-10 w/ hgb 6.9 today w/ concern for upper GI bleeding  Pending pRBC transfusion  Trend hgb  Transfuse for hgb <7  Check anemia panel

## 2023-08-22 NOTE — Progress Notes (Signed)
 OT Cancellation Note  Patient Details Name: [name] MRN: [id number] DOB: 04-28-1946   Cancelled Treatment:    Reason Eval/Treat Not Completed: OT screened, no needs identified, will sign off. Pt seated EOB receiving a blood transfusion with son present. He reports IND with all tasks and continues to walk household distances as well as able to grocery shopping pushing the cart. Some R knee pain reported which limits his mobility, but still IND.Occasional SPC use, no trouble with ADLs. He denies acute OT needs and OT to sign off in house.  Nazarene Bunning E Jamie Hafford 08/22/2023, 2:25 PM

## 2023-08-22 NOTE — Assessment & Plan Note (Addendum)
 Baseline CAD  No active CP  Trop neg  EKG rate controlled afib  Cont home regimen- hold antiplatelet regimen in setting of concern for upper GI bleeding

## 2023-08-22 NOTE — ED Provider Notes (Signed)
 [facility] Provider Note    Event Date/Time   First MD Initiated Contact with Patient 08/22/23 [phone]     (approximate)   History   Shortness of Breath   HPI  [name] is a 78 y.o. male with a history of CHF, CKD, type 2 diabetes, hypertension, hyperlipidemia, anemia of chronic disease, OSA, and GERD who presents with shortness of breath, acutely worsened over the last few weeks, occurring both at rest and with minimal exertion.  The patient states that he gets very short of breath just walking a few steps.  He also reports decreased energy and states he is feeling cold all the time.  He states this feels similar to when he has had a low blood count before.  He reports dark stools over the last few weeks but no frank blood.  He states that he has anemia, takes oral iron  and has gotten several iron  infusions.  He also has been transfused in the past.  He states he was seen by gastroenterology yesterday, told he had pleural effusions and fluid in the lungs and needed to come to the hospital to be evaluated.  He reports bilateral lower leg swelling but states that actually has improved over the last week with a water  pill.  He denies any chest pain.  I reviewed the past medical records.  The patient's most recent outpatient encounter was with GI yesterday for follow-up.  At that time he had reported dark loose stools.  Imaging was noted to show pleural effusions.  He was recommended to come to the ED at that time.   Physical Exam   Triage Vital Signs: ED Triage Vitals  Encounter Vitals Group     BP 08/22/23 0729 130/73     Systolic BP Percentile --      Diastolic BP Percentile --      Pulse Rate 08/22/23 0729 66     Resp 08/22/23 0729 18     Temp 08/22/23 0729 97.7 F (36.5 C)     Temp Source 08/22/23 0729 Oral     SpO2 08/22/23 0729 95 %     Weight 08/22/23 0727 218 lb 4.1 oz (99 kg)     Height 08/22/23 0727 6' 1 (1.854 m)     Head Circumference --       Peak Flow --      Pain Score --      Pain Loc --      Pain Education --      Exclude from Growth Chart --     Most recent vital signs: Vitals:   08/22/23 0729  BP: 130/73  Pulse: 66  Resp: 18  Temp: 97.7 F (36.5 C)  SpO2: 95%     General: Awake, no distress.  CV:  Good peripheral perfusion.  Resp:  Normal effort.  Diminished breath sounds to bilateral bases. Abd:  No distention.  Other:  2+ bilateral lower extremity edema.  Pale appearing.   ED Results / Procedures / Treatments   Labs (all labs ordered are listed, but only abnormal results are displayed) Labs Reviewed  BASIC METABOLIC PANEL - Abnormal; Notable for the following components:      Result Value   CO2 20 (*)    Glucose, Bld 140 (*)    BUN 41 (*)    Creatinine, Ser 2.48 (*)    GFR, Estimated 26 (*)    All other components within normal limits  CBC - Abnormal; Notable  for the following components:   RBC 2.11 (*)    Hemoglobin 6.9 (*)    HCT 22.2 (*)    MCV 105.2 (*)    RDW 18.6 (*)    Platelets 103 (*)    nRBC 0.5 (*)    All other components within normal limits  BRAIN NATRIURETIC PEPTIDE - Abnormal; Notable for the following components:   B Natriuretic Peptide 1,069.2 (*)    All other components within normal limits  TROPONIN I (HIGH SENSITIVITY) - Abnormal; Notable for the following components:   Troponin I (High Sensitivity) 23 (*)    All other components within normal limits  TROPONIN I (HIGH SENSITIVITY) - Abnormal; Notable for the following components:   Troponin I (High Sensitivity) 24 (*)    All other components within normal limits  HEMOGLOBIN AND HEMATOCRIT, BLOOD  HEMOGLOBIN AND HEMATOCRIT, BLOOD  TYPE AND SCREEN  PREPARE RBC (CROSSMATCH)     EKG  ED ECG REPORT I, Waylon Cassis, the attending physician, personally viewed and interpreted this ECG.  Date: 08/22/2023 EKG Time: 0735 Rate: 59 Rhythm: Atrial fibrillation QRS Axis: normal Intervals: RBBB, LAFB ST/T  Wave abnormalities: Nonspecific ST abnormalities Narrative Interpretation: no evidence of acute ischemia    RADIOLOGY  Chest x-ray: I independently viewed and interpreted the images; there are bilateral pleural effusions and interstitial edema  PROCEDURES:  Critical Care performed: No  Procedures   MEDICATIONS ORDERED IN ED: Medications  0.9 %  sodium chloride  infusion (has no administration in time range)  ondansetron  (ZOFRAN ) tablet 4 mg (has no administration in time range)    Or  ondansetron  (ZOFRAN ) injection 4 mg (has no administration in time range)  sodium chloride  flush (NS) 0.9 % injection 3 mL (has no administration in time range)  sodium chloride  flush (NS) 0.9 % injection 3 mL (has no administration in time range)  0.9 %  sodium chloride  infusion (has no administration in time range)     IMPRESSION / MDM / ASSESSMENT AND PLAN / ED COURSE  I reviewed the triage vital signs and the nursing notes.  78 year old male with PMH as noted above presents with subacute worsening shortness of breath and decreased energy.  He was seen in GI yesterday and referred to the ED due to pleural effusion seen on imaging then.  On exam the patient is pale and weak appearing but in no acute distress.  Vital signs are normal.  He is not requiring any oxygen at rest.  Initial lab workup reveals hemoglobin of 6.9 which is a drop from the patient's baseline around 8-9.  Creatinine is elevated but this is consistent with his recent baseline.  Chest x-ray shows bilateral pleural effusions and interstitial edema.  Differential diagnosis includes, but is not limited to, worsening anemia of chronic disease, less likely GI bleed, CHF exacerbation with fluid overload, or combination thereof.  The patient will need a blood transfusion and I have ordered 1 unit of PRBCs.  He likely will need diuresis after this due to the edema from CHF.  He will need admission for further workup and monitoring.  Given  that the patient is taking iron , there will be little benefit from a stool guaiac.  Patient's presentation is most consistent with acute presentation with potential threat to life or bodily function.  The patient is on the cardiac monitor to evaluate for evidence of arrhythmia and/or significant heart rate changes.  ----------------------------------------- 10:58 AM on 08/22/2023 -----------------------------------------  I consulted Dr. Eldonna from the hospitalist  service; based on our discussion he agrees to evaluate the patient for admission.   FINAL CLINICAL IMPRESSION(S) / ED DIAGNOSES   Final diagnoses:  Symptomatic anemia  Pleural effusion     Rx / DC Orders   ED Discharge Orders     None        Note:  This document was prepared using Dragon voice recognition software and may include unintentional dictation errors.    Jacolyn Pae, MD 08/22/23 1059

## 2023-08-22 NOTE — ED Provider Triage Note (Signed)
 Emergency Medicine Provider Triage Evaluation Note  [name] , a 78 y.o. male  was evaluated in triage.  Pt complains of Sob, worse when walking, history of CHF.  Review of Systems  Positive: SOB, CP Negative: dizziness  Physical Exam  BP 130/73 (BP Location: Right Arm)   Pulse 66   Temp 97.7 F (36.5 C) (Oral)   Resp 18   Ht 6' 1 (1.854 m)   Wt 99 kg   SpO2 95%   BMI 28.80 kg/m  Gen:   Awake, no distress   Resp:  Normal effort  MSK:   Moves extremities without difficulty  Other:    Medical Decision Making  Medically screening exam initiated at 7:31 AM.  Appropriate orders placed.  [name] was informed that the remainder of the evaluation will be completed by another provider, this initial triage assessment does not replace that evaluation, and the importance of remaining in the ED until their evaluation is complete.     [name] LABOR, PA-C 08/22/23 [phone]

## 2023-08-22 NOTE — Assessment & Plan Note (Signed)
 BP stable  Monitor w/ diuresis

## 2023-08-22 NOTE — Consult Note (Signed)
 [facility] CARDIOLOGY CONSULT NOTE       Patient ID: [name] MRN: [id number] DOB/AGE: 76-Feb-1947 76 y.o.  Admit date: 08/22/2023 Referring Physician Dr. Elspeth Masters Primary Physician [name], MD  Primary Cardiologist Dr. Florencio Reason for Consultation AoCHF  HPI: [name] is a 78 y.o. male  with a past medical history of CAD s/p CABG (LIMA-LAD, SVG - LCX 100% occluded, SVG - PDA s/p PCI (2021) with prior stents, paroxysmal AF (not anticoagulated), HFpEF (severe LVH, mild AS, moderate AI, moderate to severe MR, moderate TR by echo 09/2022) DM2, CKD, OSA  who presented to the ED on 08/22/2023 for worsening shortness of breath. Cardiology was consulted for further evaluation.   Patient states that he has been experiencing shortness of breath and fatigue for months.  States that over the last 3 weeks he noticed that his shortness of breath is gotten significantly worse and yesterday he saw GI who recommended he come to the ED for further evaluation and management.  Workup in the ED notable for creatinine 2.48, potassium 4.5, hemoglobin 6.9, WBC 4.2.  BNP elevated at 1069.  Troponins checked and trended 23 > 24.  EKG demonstrated atrial fibrillation with controlled rate, nonacute.  Chest x-ray demonstrated bilateral pleural effusions as well as interstitial edema.  He was started on IV Lasix  in the ED.  At the time of my evaluation this morning patient is resting comfortably in ED stretcher with family present at bedside.  We discussed his recent symptoms in further detail.  States that his shortness of symptoms typically get worse with exertion and he is now not able to get around very well because of this.  States that he is also noted an increase in his lower extremity edema over the last few weeks.  He monitors his weight daily and this has trended up as well.  He reports occasional episodes of chest discomfort for which he takes nitroglycerin .  States that he has been  undergoing outpatient workup for this.  He denies any active chest pain at this time.  Review of systems complete and found to be negative unless listed above    Past Medical History:  Diagnosis Date   (HFpEF) heart failure with preserved ejection fraction (HCC) 07/25/2016   a.)TTE 07/25/16: EF >55, triv-mild pan regur, mild AS, G2DD; b.)TTE 09/02/16: EF >55, triv TR, mild AS; c.)TTE 11/05/16: EF >55, LAE, triv-mild pan regur, mild AS, G2DD; d.)TTE 01/21/20: EF >55, mod LVH, LAE/RVE, triv-mild AR/PR/TR, mod MR, mild AS, G2DD; e.)TTE 09/24/22: EF>55, sev LVH, sev LAE, RAE, mild PR, mod AR/MR/TR, mild-mod AS, RVSP 74.7; f.)TTE 12/23/22: EF 60-65, LAE, mild-mod MR/TR, mild AS   Abnormal urine 05/01/2023   Anemia    Aortic atherosclerosis (HCC)    Aortic stenosis 07/25/2016   a.) TTE 07/25/2016: mild (MPG 13.5); b.) TTE 09/02/2016: mild (MPG 16); c.) TTE 11/05/2016: mild (MPG 9); d.) TTE 01/21/2020: mild (MPG 11.7); e.) TTE 09/24/2022: mild-mod (MPG 20.3); f.) TTE 12/23/2022: mild (MPG 15)   Arthritis    Atrial fibrillation (HCC)    a.) CHA2DS2VASc = 6 (age x2, CHF, HTN, vascular disease history, T2DM);  b.) rate/rhythm maintained on oral labetalol ; chronically anticoagulated with apixaban  + clopidogrel    AV block, 1st degree    B12 deficiency    CKD (chronic kidney disease), stage III (HCC)    Colon polyps    Congestive heart failure (HCC) 05/01/2023   Coronary artery disease 08/16/2016   a.) s/p 3v  CABG 08/30/2016; b.) s/p PCI 11/26/2016 (DES x 1 oSVG-RCA); c.) s/p PCI 12/17/2016 (DES x 5 --> mLCx x2, dLCx, pLAD, dLAD); c.) s/p PCI 06/09/2020 (DES x1 --> ISR oSVG-PDA)   Cyst of kidney, acquired 07/03/2023   Dyspnea    Gastroesophageal reflux disease 05/01/2023   GERD (gastroesophageal reflux disease)    Hepatic steatosis    History of 2019 novel coronavirus disease (COVID-19) 09/02/2020   a.) Tx'd with remdesivir    History of blood transfusion    History of GI bleed 09/02/2020   a.)  ad[facility] to [facility] 09/02/2020 - 09/08/2020   History of heart artery stent 11/26/2016   TOTAL of 7 stents: a.) 11/26/2016 --> 3.5 x 22 mm Resolute Integrity oSVG-RCA; b.) 12/17/2016: 3.0 x 12 mm Xience Alpine dLCx, 3.5 x 22 mm Resolute Onyx mLCx, 3.5 x 18 mm Resolute Onyx mLCx, 3.0 x 26 mm Resolute Onyx pLAD, 2.0 x 26 mm Resolute Onyx dLAD; c.) 06/09/2020 --> 2.5 x 18 mm Resolute Onyx ISR oSVG-PDA   History of partial colectomy 08/12/2012   a.) large gastric hyperplastic polyp removal   HLD (hyperlipidemia)    Hypertension    LBBB (left bundle branch block)    Long term current use of anticoagulant    a.) apixaban    Long term current use of antithrombotics/antiplatelets    a.) clopidogrel    Male hypogonadism    a.) on exogenous TRT (1.62% gel)   Mild cardiomegaly    Mild gynecomastia (bilateral)    Nephrolithiasis    OSA (obstructive sleep apnea)    a.) does not utilize nocturnal PAP therapy   Personal history of surgery to heart and great vessels, presenting hazards to health 05/01/2023   Proteinuria, unspecified 08/01/2023   Pulmonary hypertension (HCC) 09/24/2022   a.) TTE 09/24/2022: RVSP 74.7; b.) TTE 12/23/2022: RVSP 31.3   RBBB (right bundle branch block with left anterior fascicular block)    Right inguinal hernia    Right thyroid  nodule    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   a.) LIMA-LAD, SVG-PDA, SVG-OM3   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/01/2023   Type 2 diabetes mellitus treated with insulin  (HCC)    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   Umbilical hernia    Unstable angina (HCC)    Vitamin D  deficiency     Past Surgical History:  Procedure Laterality Date   APPENDECTOMY     CARDIAC CATHETERIZATION Left 08/16/2016   Procedure: Left Heart Cath and Coronary Angiography;  Surgeon: Cara JONETTA Lovelace, MD;  Location: [facility] INVASIVE CV LAB;  Service: Cardiovascular;  Laterality: Left;   COLONOSCOPY N/A 09/07/2020   Procedure:  COLONOSCOPY;  Surgeon: Janalyn Keene NOVAK, MD;  Location: [facility] ENDOSCOPY;  Service: Endoscopy;  Laterality: N/A;   COLONOSCOPY WITH PROPOFOL  N/A 05/17/2021   Procedure: COLONOSCOPY WITH PROPOFOL ;  Surgeon: Janalyn Keene NOVAK, MD;  Location: [facility] ENDOSCOPY;  Service: Endoscopy;  Laterality: N/A;   CORONARY ANGIOPLASTY WITH STENT PLACEMENT Left 11/26/2016   Procedure: CORONARY ANGIOPLASTY WITH STENT PLACEMENT; Location: [facility]; Surgeon: Alm Dais, MD   CORONARY ARTERY BYPASS GRAFT N/A 08/30/2016   Procedure: CORONARY ARTERY BYPASS GRAFT; Location: [facility]; Surgeon: Juliene Pouch, MD   CORONARY STENT INTERVENTION N/A 06/09/2020   Procedure: CORONARY STENT INTERVENTION;  Surgeon: Lovelace Cara JONETTA, MD;  Location: [facility] INVAS[facility] CV LAB;  Service: Cardiovascular;  Laterality: N/A;   CORONARY STENT INTERVENTION Left 12/17/2016   Procedure: CORONARY STENT INTERVENTION; Location: [facility]; Surgeon: Alm Dais, MD  ENDOSCOPIC MUCOSAL RESECTION N/A 09/22/2020   Procedure: ENDOSCOPIC MUCOSAL RESECTION;  Surgeon: [name] Aloha Raddle., MD;  Location: [facility] ENDOSCOPY;  Service: Gastroenterology;  Laterality: N/A;   ESOPHAGOGASTRODUODENOSCOPY N/A 09/07/2020   Procedure: ESOPHAGOGASTRODUODENOSCOPY (EGD);  Surgeon: Janalyn Keene NOVAK, MD;  Location: [facility] ENDOSCOPY;  Service: Endoscopy;  Laterality: N/A;   ESOPHAGOGASTRODUODENOSCOPY (EGD) WITH PROPOFOL  N/A 09/22/2020   Procedure: ESOPHAGOGASTRODUODENOSCOPY (EGD) WITH PROPOFOL ;  Surgeon: [name] Aloha Raddle., MD;  Location: [facility] ENDOSCOPY;  Service: Gastroenterology;  Laterality: N/A;   FRACTURE SURGERY     HEMOSTASIS CLIP PLACEMENT  09/22/2020   Procedure: HEMOSTASIS CLIP PLACEMENT;  Surgeon: [name] Aloha Raddle., MD;  Location: [facility] ENDOSCOPY;  Service: Gastroenterology;;   HEMOSTASIS CONTROL  09/22/2020   Procedure: HEMOSTASIS CONTROL;  Surgeon: [name] Aloha Raddle., MD;  Location: [facility] ENDOSCOPY;  Service: Gastroenterology;;   INSERTION OF MESH  02/19/2023    Procedure: INSERTION OF MESH;  Surgeon: Desiderio Schanz, MD;  Location: [facility] ORS;  Service: General;;  umbilical   LAPAROSCOPIC PARTIAL RIGHT COLECTOMY Right 08/12/2012   Procedure: LAPAROSCOPIC PARTIAL RIGHT COLECTOMY; Location: [facility]; Surgeon: Unknown Sharps, MD   LEFT HEART CATH AND CORONARY ANGIOGRAPHY Left 06/09/2020   Procedure: LEFT HEART CATH AND CORONARY ANGIOGRAPHY;  Surgeon: Florencio Cara BIRCH, MD;  Location: [facility] CV LAB;  Service: Cardiovascular;  Laterality: Left;   LEFT HEART CATH AND CORS/GRAFTS ANGIOGRAPHY Left 11/20/2016   Procedure: Left Heart Cath and Cors/Grafts Angiography;  Surgeon: Cara BIRCH Florencio, MD;  Location: [facility] INVASIVE CV LAB;  Service: Cardiovascular;  Laterality: Left;   POLYPECTOMY  09/22/2020   Procedure: POLYPECTOMY;  Surgeon: Mansouraty, Aloha Raddle., MD;  Location: [facility] ENDOSCOPY;  Service: Gastroenterology;;   SUBMUCOSAL LIFTING INJECTION  09/22/2020   Procedure: SUBMUCOSAL LIFTING INJECTION;  Surgeon: [name] Aloha Raddle., MD;  Location: [facility] ENDOSCOPY;  Service: Gastroenterology;;   UMBILICAL HERNIA REPAIR N/A 02/19/2023   Procedure: HERNIA REPAIR UMBILICAL ADULT, open;  Surgeon: Desiderio Schanz, MD;  Location: [facility] ORS;  Service: General;  Laterality: N/A;   XI ROBOTIC ASSISTED INGUINAL HERNIA REPAIR WITH MESH Right 02/19/2023   Procedure: XI ROBOTIC ASSISTED INGUINAL HERNIA REPAIR WITH MESH;  Surgeon: Desiderio Schanz, MD;  Location: [facility] ORS;  Service: General;  Laterality: Right;    (Not in a hospital admission)  Social History   Socioeconomic History   Marital status: Married    Spouse name: Not on file   Number of children: Not on file   Years of education: Not on file   Highest education level: Not on file  Occupational History   Not on file  Tobacco Use   Smoking status: Former    Types: Cigarettes    Passive exposure: Never   Smokeless tobacco: Never  Vaping Use   Vaping status: Never Used  Substance and Sexual Activity   Alcohol use:  No   Drug use: No   Sexual activity: Not Currently  Other Topics Concern   Not on file  Social History Narrative   Not on file   Social Drivers of Health   Financial Resource Strain: Not on file  Food Insecurity: No Food Insecurity (12/26/2022)   Hunger Vital Sign    Worried About Running Out of Food in the Last Year: Never true    Ran Out of Food in the Last Year: Never true  Transportation Needs: No Transportation Needs (12/26/2022)   PRAPARE - Administrator, Civil Service (Medical): No    Lack of Transportation (Non-Medical): No  Physical Activity: Not on file  Stress: Not on file  Social Connections: Not on file  Intimate Partner Violence: Not At Risk (12/26/2022)   Humiliation, Afraid, Rape, and Kick questionnaire    Fear of Current or Ex-Partner: No    Emotionally Abused: No    Physically Abused: No    Sexually Abused: No    History reviewed. No pertinent family history.   Vitals:   08/22/23 0727 08/22/23 0729 08/22/23 1227  BP:  130/73   Pulse:  66   Resp:  18   Temp:  97.7 F (36.5 C) (!) 97.4 F (36.3 C)  TempSrc:  Oral Oral  SpO2:  95%   Weight: 99 kg    Height: 6' 1 (1.854 m)      PHYSICAL EXAM General: Chronically ill-appearing male, well nourished, in no acute distress. HEENT: Normocephalic and atraumatic. Neck: No JVD.  Lungs: Normal respiratory effort on room air.  Breath sounds diminished bilaterally Heart: HRRR. Normal S1 and S2 without gallops or murmurs.  Abdomen: Non-distended appearing.  Msk: Normal strength and tone for age. Extremities: Warm and well perfused. No clubbing, cyanosis.  2+ edema, tense woody appearing lower extremities.  Neuro: Alert and oriented X 3. Psych: Answers questions appropriately.   Labs: Basic Metabolic Panel: Recent Labs    08/22/23 0730  NA 136  K 4.5  CL 104  CO2 20*  GLUCOSE 140*  BUN 41*  CREATININE 2.48*  CALCIUM  10.3   Liver Function Tests: No results for input(s): AST, ALT,  ALKPHOS, BILITOT, PROT, ALBUMIN in the last 72 hours. No results for input(s): LIPASE, AMYLASE in the last 72 hours. CBC: Recent Labs    08/22/23 0730 08/22/23 1300  WBC 4.2  --   HGB 6.9* 7.2*  HCT 22.2* 22.5*  MCV 105.2*  --   PLT 103*  --    Cardiac Enzymes: Recent Labs    08/22/23 0730 08/22/23 1011  TROPONINIHS 23* 24*   BNP: Recent Labs    08/22/23 0730  BNP 1,069.2*   D-Dimer: No results for input(s): DDIMER in the last 72 hours. Hemoglobin A1C: No results for input(s): HGBA1C in the last 72 hours. Fasting Lipid Panel: No results for input(s): CHOL, HDL, LDLCALC, TRIG, CHOLHDL, LDLDIRECT in the last 72 hours. Thyroid  Function Tests: No results for input(s): TSH, T4TOTAL, T3FREE, THYROIDAB in the last 72 hours.  Invalid input(s): FREET3 Anemia Panel: Recent Labs    08/22/23 1300  RETICCTPCT 3.8*     Radiology: DG Chest 2 View Result Date: 08/22/2023 CLINICAL DATA:  78 year old male with shortness of breath, fatigue. EXAM: CHEST - 2 VIEW COMPARISON:  CT Chest, Abdomen, and Pelvis 08/20/2023 and earlier. FINDINGS: PA and lateral views 0743 hours today. Stable cardiomegaly and mediastinal contours. Prior CABG. Ongoing bilateral pleural effusions, moderate and slightly larger on the right. No pneumothorax. Bilateral pulmonary vascular congestion is relatively mild, mild interstitial edema is possible. No air bronchograms. Overall ventilation not significantly changed from recent CT. No acute osseous abnormality identified. Negative visible bowel gas. IMPRESSION: Bilateral pleural effusions and interstitial edema. Ventilation not significantly changed from CT two days ago. Electronically Signed   By: VEAR Hurst M.D.   On: 08/22/2023 07:59   CT CHEST ABDOMEN PELVIS WO CONTRAST Result Date: 08/20/2023 CLINICAL DATA:  Follow-up pleural effusions and abdominal ascites. EXAM: CT CHEST, ABDOMEN AND PELVIS WITHOUT CONTRAST TECHNIQUE:  Multidetector CT imaging of the chest, abdomen and pelvis was performed following the standard protocol without IV contrast. RADIATION DOSE REDUCTION: This exam was performed  according to the departmental dose-optimization program which includes automated exposure control, adjustment of the mA and/or kV according to patient size and/or use of iterative reconstruction technique. COMPARISON:  Abdominal ultrasound 08/06/2023 FINDINGS: CT CHEST FINDINGS Cardiovascular: The heart is mildly enlarged. The aorta is normal in caliber. Scattered atherosclerotic calcifications. Three-vessel coronary artery calcifications and surgical changes from prior coronary artery bypass surgery. Mediastinum/Nodes: Several enlarged mediastinal and hilar lymph nodes which could be related to the patient's lung findings and pleural effusions. The esophagus is grossly normal. There is a 2.3 cm right thyroid  lobe lesion. This has been evaluated on previous imaging. (ref: J Am Coll Radiol. 2015 Feb;12(2): 143-50). Lungs/Pleura: Moderate-sized left pleural effusion and large right pleural effusion. Overlying atelectasis and moderate fluid in the left major fissure. No pulmonary edema. No worrisome pulmonary lesions. Musculoskeletal: Advanced degenerative changes involving the thoracic spine. Median sternotomy wires related to prior heart surgery. CT ABDOMEN PELVIS FINDINGS Hepatobiliary: Suspect cirrhotic changes involving the liver with irregular hepatic contour, dilated hepatic fissures and increased caudate to right lobe ratio. No hepatic lesions are identified without contrast peer small calcified granuloma noted in the right lobe. The gallbladder is moderately contracted. There is associated gallbladder wall thickening and pericholecystic fluid likely due to the patient's ascites. No biliary dilatation. Pancreas: No mass, inflammation or ductal dilatation. Spleen: Within normal limits in size.  No splenic lesions. Adrenals/Urinary Tract:  The adrenal glands are normal. Simple bilateral renal cysts and bilateral renal calculi not requiring any further imaging evaluation or follow-up. The bladder is unremarkable. Stomach/Bowel: The stomach, duodenum, small bowel and colon grossly normal. Evidence of prior colonic surgery. Vascular/Lymphatic: Advanced atherosclerotic calcification involving the aorta, iliac arteries and branch vessels but no aneurysm. Borderline mesenteric and retroperitoneal lymph nodes likely related to the patient's ascites and cirrhosis. Reproductive: The prostate gland and seminal vesicles are unremarkable. Other: Small/moderate volume abdominal/pelvic ascites. Musculoskeletal: No significant bony findings. Advanced degenerative changes involving the spine and moderate bilateral hip joint degenerative changes. IMPRESSION: 1. Moderate-sized left pleural effusion and large right pleural effusion. Overlying atelectasis and moderate fluid in the left major fissure. 2. Several enlarged mediastinal and hilar lymph nodes which could be related to the patient's lung findings and pleural effusions. 3. Cirrhotic changes involving the liver. No hepatic lesions are identified without contrast. 4. Small/moderate volume abdominal/pelvic ascites. 5. Gallbladder wall thickening and pericholecystic fluid likely due to the patient's ascites and cirrhosis. 6. Advanced atherosclerotic calcification.  No aneurysm. Aortic Atherosclerosis (ICD10-I70.0). Electronically Signed   By: MYRTIS Stammer M.D.   On: 08/20/2023 14:48   US  ABDOMEN LIMITED RUQ (LIVER/GB) Result Date: 08/06/2023 CLINICAL DATA:  Evaluate for ascites.  Elevated LFTs. EXAM: ULTRASOUND ABDOMEN LIMITED RIGHT UPPER QUADRANT COMPARISON:  Ultrasound abdomen 09/19/2022 FINDINGS: Gallbladder: Multiple gallbladder polyps measuring up to 8 mm. Small stones in the gallbladder lumen. No gallbladder wall thickening or pericholecystic fluid. Common bile duct: Diameter: 3.1 mm Liver: Mild coarsened  hepatic parenchymal echogenicity. No focal lesion. Portal vein is patent on color Doppler imaging with normal direction of blood flow towards the liver. Other: Bilateral pleural effusions. Upper abdominal ascites. 2.1 cm cyst exophytic superior pole right kidney. No imaging follow-up needed. IMPRESSION: 1. Upper abdominal ascites. 2. Bilateral pleural effusions. 3. Cholelithiasis without secondary signs of acute cholecystitis. 4. Multiple gallbladder polyps measuring up to 8 mm. Recommend follow-up ultrasound in 6 months. Electronically Signed   By: Bard Moats M.D.   On: 08/06/2023 09:06    ECHO pending  TELEMETRY reviewed by  me 08/22/2023: Not on telemetry  EKG reviewed by me: Atrial fibrillation rate 59 bpm  Data reviewed by me 08/22/2023: last 24h vitals tele labs imaging I/O ED provider note, admission H&P  Principal Problem:   CHF exacerbation (HCC) Active Problems:   Essential hypertension   OSA (obstructive sleep apnea)   CAD (coronary artery disease), native coronary artery   Hyperlipidemia associated with type 2 diabetes mellitus (HCC)   Acute blood loss anemia   Acute heart failure with preserved ejection fraction (HFpEF) (HCC)   Paroxysmal atrial fibrillation (HCC)   Acute kidney injury superimposed on chronic kidney disease (HCC)   Upper GI bleeding   Pleural effusion    ASSESSMENT AND PLAN:  [name] is a 78 y.o. male  with a past medical history of CAD s/p CABG (LIMA-LAD, SVG - LCX 100% occluded, SVG - PDA s/p PCI (2021) with prior stents, paroxysmal AF (not anticoagulated), HFpEF (severe LVH, mild AS, moderate AI, moderate to severe MR, moderate TR by echo 09/2022) DM2, CKD, OSA  who presented to the ED on 08/22/2023 for worsening shortness of breath. Cardiology was consulted for further evaluation.   # Acute on chronic HFpEF # Coronary artery disease Presenting with worsening shortness of breath over the last few weeks.  Also noticed increased weight and lower  extremity edema.  BNP elevated at 1069.  Troponins trended 23 > 24.  CXR with bilateral pleural effusions and interstitial edema. -Echo pending -Will plan for diuresis with IV Lasix  60 mg twice daily. -Primary team to set up thoracentesis with IR. -Resume home rosuvastatin  40 mg daily.   -No plan for further cardiac diagnostics.  # Upper GI bleed Patient recently undergoing workup outpatient for upper GI bleed.  Planning to have EGD outpatient.  Hemoglobin down to 6.9 today. -Plan for transfusion today per primary team.  # Paroxysmal atrial fibrillation  Patient with a history of paroxysmal atrial fibrillation on labetalol  at home for rate control as well as Eliquis .  Denies any palpitation symptoms. -Will hold off on resuming home labetalol  given acute heart failure exacerbation.  Plan to resume within the next day or 2. -Resume home Eliquis  5 mg twice daily for stroke risk reduction.   This patient's plan of care was discussed and created with Dr. Ammon and he is in agreement.  Signed: Danita Bloch, PA-C  08/22/2023, 1:43 PM [facility]

## 2023-08-22 NOTE — Progress Notes (Signed)
 Heart Failure Navigator Progress Note  Assessed for Heart & Vascular TOC clinic readiness.  Patient does not meet criteria due to [facility] patient of Dr. Juliann Pares.   Navigator will sign off at this time.  Roxy Horseman, RN, BSN [facility] Heart Failure Navigator Secure Chat Only

## 2023-08-22 NOTE — Assessment & Plan Note (Signed)
 Rate controlled afib on EKG  Hold eliquis in setting of concern for upper GIB

## 2023-08-23 ENCOUNTER — Inpatient Hospital Stay: Admit: 2023-08-23 | Discharge: 2023-08-23 | Disposition: A | Discharge status: Home / Self Care | Payer: Medicare HMO

## 2023-08-23 ENCOUNTER — Inpatient Hospital Stay: Payer: Medicare HMO

## 2023-08-23 ENCOUNTER — Encounter: Payer: Self-pay | Admitting: Oncology

## 2023-08-23 ENCOUNTER — Other Ambulatory Visit (HOSPITAL_COMMUNITY): Payer: Self-pay

## 2023-08-23 ENCOUNTER — Telehealth (HOSPITAL_COMMUNITY): Payer: Self-pay | Admitting: Pharmacy Technician

## 2023-08-23 ENCOUNTER — Encounter: Payer: Self-pay | Admitting: Family Medicine

## 2023-08-23 ENCOUNTER — Inpatient Hospital Stay: Admit: 2023-08-23 | Payer: Medicare HMO

## 2023-08-23 ENCOUNTER — Ambulatory Visit: Payer: Medicare HMO

## 2023-08-23 ENCOUNTER — Inpatient Hospital Stay
Admit: 2023-08-23 | Discharge: 2023-08-23 | Disposition: A | Discharge status: Home / Self Care | Payer: Medicare HMO | Attending: Family Medicine | Admitting: Family Medicine

## 2023-08-23 DIAGNOSIS — I5033 Acute on chronic diastolic (congestive) heart failure: Secondary | ICD-10-CM | POA: Diagnosis not present

## 2023-08-23 LAB — ECHOCARDIOGRAM COMPLETE
AR max vel: 1.33 cm2
AV Area VTI: 1.57 cm2
AV Area mean vel: 1.38 cm2
AV Mean grad: 12.7 mm[Hg]
AV Peak grad: 22 mm[Hg]
Ao pk vel: 2.35 m/s
Area-P 1/2: 4.24 cm2
Height: 73 in
S' Lateral: 3.1 cm
Weight: 3492.09 [oz_av]

## 2023-08-23 LAB — HEMOGLOBIN AND HEMATOCRIT, BLOOD
HCT: 23.6 % — ABNORMAL LOW (ref 39.0–52.0)
HCT: 25.6 % — ABNORMAL LOW (ref 39.0–52.0)
Hemoglobin: 7.5 g/dL — ABNORMAL LOW (ref 13.0–17.0)
Hemoglobin: 8.1 g/dL — ABNORMAL LOW (ref 13.0–17.0)

## 2023-08-23 LAB — COMPREHENSIVE METABOLIC PANEL
ALT: 17 U/L (ref 0–44)
AST: 18 U/L (ref 15–41)
Albumin: 4.1 g/dL (ref 3.5–5.0)
Alkaline Phosphatase: 85 U/L (ref 38–126)
Anion gap: 13 (ref 5–15)
BUN: 43 mg/dL — ABNORMAL HIGH (ref 8–23)
CO2: 21 mmol/L — ABNORMAL LOW (ref 22–32)
Calcium: 10.5 mg/dL — ABNORMAL HIGH (ref 8.9–10.3)
Chloride: 107 mmol/L (ref 98–111)
Creatinine, Ser: 2.63 mg/dL — ABNORMAL HIGH (ref 0.61–1.24)
GFR, Estimated: 25 mL/min — ABNORMAL LOW (ref >60)
Glucose, Bld: 114 mg/dL — ABNORMAL HIGH (ref 70–99)
Potassium: 3.9 mmol/L (ref 3.5–5.1)
Sodium: 141 mmol/L (ref 135–145)
Total Bilirubin: 1.5 mg/dL — ABNORMAL HIGH (ref 0.0–1.2)
Total Protein: 6.9 g/dL (ref 6.5–8.1)

## 2023-08-23 LAB — BODY FLUID CELL COUNT WITH DIFFERENTIAL
Lymphs, Fluid: 31 %
Monocyte-Macrophage-Serous Fluid: 67 %
Neutrophil Count, Fluid: 2 %
Total Nucleated Cell Count, Fluid: 113 uL

## 2023-08-23 LAB — PROTEIN, PLEURAL OR PERITONEAL FLUID: Total protein, fluid: <3 g/dL

## 2023-08-23 LAB — GLUCOSE, PLEURAL OR PERITONEAL FLUID: Glucose, Fluid: 117 mg/dL

## 2023-08-23 LAB — ALBUMIN, PLEURAL OR PERITONEAL FLUID: Albumin, Fluid: 1.6 g/dL

## 2023-08-23 MED ORDER — LABETALOL HCL 100 MG PO TABS
100.0000 mg | ORAL_TABLET | Freq: Two times a day (BID) | ORAL | Status: DC
  Administered 2023-08-23 – 2023-08-29 (×13): 100 mg via ORAL
  Filled 2023-08-23 (×15): qty 1

## 2023-08-23 MED ORDER — LIDOCAINE HCL (PF) 1 % IJ SOLN
10.0000 mL | Freq: Once | INTRAMUSCULAR | Status: AC
  Administered 2023-08-23: 10 mL via INTRADERMAL

## 2023-08-23 NOTE — Evaluation (Signed)
 Physical Therapy Evaluation Patient Details Name: [name] MRN: [id number] DOB: 08/09/1946 Today's Date: 08/23/2023  History of Present Illness  [name] is a 77yoM PMH: CAD s/p CABG and DES, severe pulmonary hypertension, HTN, HLD, OSA, type 2 diabetes, hypoTSH presenting w/ acute on chronic HFpEF, upper GIB, ABLA, acute on chronic kidney disease. Pt is no s/p 1 unit PRBC 08/22/23  Clinical Impression  Pt in ED upon assessment, seated EOB on entry, agreeable to PT assessment. Pt remains easily limited by dyspnea during slow, household distance AMB, however he says his capacity for AMB is somewhat improved since getting blood previous day. Pt has clear increased sway in gait, but does not lose his balance while up, says he feels confidence in AMB without device. Will continue to follow. Pt reports slow progressive decline in AMB tolerance over weeks to months, hence he has learned to adapt his mobility and activity strategies appropriately.       If plan is discharge home, recommend the following: A little help with walking and/or transfers;Assist for transportation;Help with stairs or ramp for entrance   Can travel by private vehicle        Equipment Recommendations None recommended by PT  Recommendations for Other Services       Functional Status Assessment Patient has had a recent decline in their functional status and demonstrates the ability to make significant improvements in function in a reasonable and predictable amount of time.     Precautions / Restrictions Precautions Precautions: Fall Restrictions Weight Bearing Restrictions Per Provider Order: No      Mobility  Bed Mobility Overal bed mobility: Modified Independent                  Transfers Overall transfer level: Modified independent Equipment used: None                    Ambulation/Gait Ambulation/Gait assistance: Contact guard assist Gait Distance (Feet): 60 Feet Assistive  device: None         General Gait Details: slow, a bit of the wobbles but without frank LOB; AMB arresting DOE at 22ft Fitzgerald VSS HR and SpO2 in mid 90s on room air  Stairs            Wheelchair Mobility     Tilt Bed    Modified Rankin (Stroke Patients Only)       Balance Overall balance assessment: Mild deficits observed, not formally tested                                           Pertinent Vitals/Pain Pain Assessment Pain Assessment: No/denies pain    Home Living Family/patient expects to be discharged to:: Private residence Living Arrangements: Spouse/significant other;Children Available Help at Discharge: Family Type of Home: House Home Access: Stairs to enter Entrance Stairs-Rails: Left;Can reach both Secretary/administrator of Steps: 3   Home Layout: One level Home Equipment: Cane - single point;Toilet riser      Prior Function Prior Level of Function : Independent/Modified Independent;Driving             Mobility Comments: stated he occasionally uses a [facility]       Extremity/Trunk Assessment                Communication      Cognition  General Comments      Exercises     Assessment/Plan    PT Assessment Patient needs continued PT services  PT Problem List Decreased activity tolerance;Decreased balance;Decreased mobility;Cardiopulmonary status limiting activity       PT Treatment Interventions DME instruction;Balance training;Gait training;Neuromuscular re-education;Stair training;Patient/family education;Functional mobility training;Therapeutic activities;Therapeutic exercise    PT Goals (Current goals can be found in the Care Plan section)  Acute Rehab PT Goals Patient Stated Goal: improve his AMB tolerance PT Goal Formulation: With patient Time For Goal Achievement: 09/06/23 Potential to Achieve Goals: Fair    Frequency Min 1X/week      Co-evaluation               AM-PAC PT 6 Clicks Mobility  Outcome Measure Help needed turning from your back to your side while in a flat bed without using bedrails?: None Help needed moving from lying on your back to sitting on the side of a flat bed without using bedrails?: None Help needed moving to and from a bed to a chair (including a wheelchair)?: None Help needed standing up from a chair using your arms (e.g., wheelchair or bedside chair)?: None Help needed to walk in hospital room?: A Little Help needed climbing 3-5 steps with a railing? : A Little 6 Click Score: 22    End of Session Equipment Utilized During Treatment: Gait belt Activity Tolerance: Patient tolerated treatment well;Patient limited by fatigue Patient left: in bed;with nursing/sitter in room Nurse Communication: Mobility status PT Visit Diagnosis: Unsteadiness on feet (R26.81);Other abnormalities of gait and mobility (R26.89);Difficulty in walking, not elsewhere classified (R26.2)    Time: 9067-9050 PT Time Calculation (min) (ACUTE ONLY): 17 min   Charges:   PT Evaluation $PT Eval Moderate Complexity: 1 Mod   PT General Charges $$ ACUTE PT VISIT: 1 Visit       3:55 PM, 08/23/23 [name], PT, DPT Physical Therapist - [facility]  [phone] (ASCOM)    [name] 08/23/2023, 3:52 PM

## 2023-08-23 NOTE — Telephone Encounter (Signed)
 Patient Product/process Development Scientist completed.    The patient is insured through [employer]. Patient has Medicare and is not eligible for a copay card, but may be able to apply for patient assistance or Medicare RX Payment Plan (Patient Must reach out to their plan, if eligible for payment plan), if available.    Ran test claim for Eliquis  5 mg and the current 30 day co-pay is $86.93 due to a $25.00 deductible.  Ran test claim for Xarelto 20 mg and the current 30 day co-pay is $86.93 due to a $25.00 deductible.  Ran test claim for Entresto 24-26 mg and the current 30 day co-pay is $86.93 due to a $25.00 deductible.  Ran test claim for Farxiga 10 mg and the current 30 day co-pay is $86.93 due to a $25.00 deductible.  Ran test claim for Jardiance  10 mg and the current 30 day co-pay is $86.93 due to a $25.00 deductible.  This test claim was processed through [facility]- copay amounts may vary at other pharmacies due to pharmacy/plan contracts, or as the patient moves through the different stages of their insurance plan.     Reyes Sharps, CPHT Pharmacy Technician III Certified Patient Advocate [facility] Patient Advocate Team Direct Number: [phone]  Fax: [phone]

## 2023-08-23 NOTE — Progress Notes (Signed)
 Heart Failure Stewardship Pharmacy Note  PCP: [name], MD PCP-Cardiologist: None  HPI: [name] is a 78 y.o. male with CAD s/p CABG (LIMA-LAD, SVG - LCX 100% occluded, SVG - PDA s/p PCI (2021) with prior stents, paroxysmal AF (not anticoagulated), HFpEF (severe LVH, mild AS, moderate AI, moderate to severe MR, moderate TR by echo 09/2022) DM2, CKD, OSA who presented with shortness of breath, dyspnea on exertion, and anasarca that has progressed over the past 2-3 months. Reports symptoms began months ago after stopping torsemide . Diuretics were restarted every other day at his cardiology visit, then recently increased on 08/20/23. BNP on admission was 1069.2. HS troponin on admission was 23 >24. Iron  labs meet HF replacement criteria and he has infusions scheduled for the next 2 Mondays. CXR showed bilateral pleural effusions, pulmonary vascular congestion, and pulmonary edema. Upper abdominal ascites present on abdominal ultrasound. Thoracentesis ordered.  Pertinent Lab Values: Creatinine  Date Value Ref Range Status  08/04/2012 1.03 0.60 - 1.30 mg/dL Final   Creatinine, Ser  Date Value Ref Range Status  08/23/2023 2.63 (H) 0.61 - 1.24 mg/dL Final   BUN  Date Value Ref Range Status  08/23/2023 43 (H) 8 - 23 mg/dL Final  87/76/7986 17 7 - 18 mg/dL Final   Potassium  Date Value Ref Range Status  08/23/2023 3.9 3.5 - 5.1 mmol/L Final  08/04/2012 4.1 3.5 - 5.1 mmol/L Final   Sodium  Date Value Ref Range Status  08/23/2023 141 135 - 145 mmol/L Final  08/04/2012 139 136 - 145 mmol/L Final   B Natriuretic Peptide  Date Value Ref Range Status  08/22/2023 1,069.2 (H) 0.0 - 100.0 pg/mL Final    Comment:    Performed at [facility], [address] [place], [place] [address]   Magnesium   Date Value Ref Range Status  12/27/2022 2.3 1.7 - 2.4 mg/dL Final    Comment:    Performed at [facility], [address] [place], [place] [address]   Hgb A1c MFr  Bld  Date Value Ref Range Status  12/21/2022 5.8 (H) 4.8 - 5.6 % Final    Comment:    (NOTE) Pre diabetes:          5.7%-6.4%  Diabetes:              >6.4%  Glycemic control for   <7.0% adults with diabetes    TSH  Date Value Ref Range Status  09/03/2020 3.235 0.350 - 4.500 uIU/mL Final    Comment:    Performed by a 3rd Generation assay with a functional sensitivity of <=0.01 uIU/mL. Performed at [facility], [address] [place], [place] [address]     Vital Signs: Admission weight: Temp:  [97.4 F (36.3 C)-98.6 F (37 C)] 98.2 F (36.8 C) (01/10 0800) Pulse Rate:  [52-72] 72 (01/10 0800) Resp:  [13-23] 20 (01/10 0800) BP: (134-165)/(61-80) 158/80 (01/10 0800) SpO2:  [92 %-100 %] 96 % (01/10 0800)  Intake/Output Summary (Last 24 hours) at 08/23/2023 1114 Last data filed at 08/23/2023 0905 Gross per 24 hour  Intake 525.33 ml  Output 2425 ml  Net -1899.67 ml    Current Heart Failure Medications:  Loop diuretic: furosemide  60 mg IV BID Beta-Blocker: labetalol  100 mg BID ACEI/ARB/ARNI: none MRA: none SGLT2i: none   Prior to admission Heart Failure Medications:  Loop diuretic: torsemide  20 mg daily (recently increased) Beta-Blocker: labetalol  100 mg BID ACEI/ARB/ARNI: none MRA: none SGLT2i: none Other vasoactive medications  include amlodipine  10 mg daily  Assessment: 1. Acute on chronic diastolic heart failure (LVEF in 12/2022 60-65%) and new echo pending, due to ICM vs mixed etiology. NYHA class IV symptoms.  -Symptoms: Reports progressive shortness of breath, DOE, and anasarca over the last month. Reports fatigue. Reports poor appetite and changes in taste. -Volume: Extremely volume overloaded on exam. JVP elevated and edema of legs to groin and evidence of abdominal edema noted on imaging. Urine output is clear on IV furosemide . Suspect prerenal AKI on admission, which should improve with diuresis.  -Hemodynamics: BP is elevated ~150s/80s. HR is  stable -SGLT2i: If creatinine begins to trend down this weekend, consider adding SGLT2i as first line treatment of HFpEF, CKD, and DM. Jardiance  has slightly better data with lower eGFRs. -MRA: Will not likely be a candidate for MRAs due to renal function. Could consider Kerendia if creatinine improved given indication for diabetic nephropathy treatment. -ARNI: Can consider Entresto given hypertension if creatinine stabilizes <2 due for prevention of CKD progression in DM and HFpEF.  -Iron  studies low, would benefit from replacement inpatient or outpatient (already has iron  infusions scheduled [facility] the cancer center)  Plan: 1) Medication changes recommended at this time: -Can consider starting Jardiance  10 mg daily if creatinine trends down  2) Patient assistance: -Eliquis  copay is $86.93, Xarelto copay is $86.93, Entresto copay is $86.93, Doreen copay is $86.93, Jardiance  copay is $86.93. Copays high due to a $25.00 deductible   3) Education: - Patient has been educated on current HF medications and potential additions to HF medication regimen - Patient verbalizes understanding that over the next few months, these medication doses may change and more medications may be added to optimize HF regimen - Patient has been educated on basic disease state pathophysiology and goals of therapy  Medication Assistance / Insurance Benefits Check: Does the patient have prescription insurance?    Type of insurance plan:  Does the patient qualify for medication assistance through manufacturers or grants? Pending  Eligible grants and/or patient assistance programs: pending  Medication assistance applications in progress: pending  Medication assistance applications approved: pending Approved medication assistance renewals will be completed by: pending.  Outpatient Pharmacy: Prior to admission outpatient pharmacy: [facility]     Please do not hesitate to reach out with questions or concerns,  Jaun Bash,  PharmD, CPP, BCPS Heart Failure Pharmacist  Phone - [phone] 08/23/2023 11:49 AM

## 2023-08-23 NOTE — Progress Notes (Signed)
*  PRELIMINARY RESULTS* Echocardiogram 2D Echocardiogram has been performed.  Cristela Blue 08/23/2023, 8:55 AM

## 2023-08-23 NOTE — Procedures (Signed)
 PROCEDURE SUMMARY:  Successful US  guided right sided thoracentesis. Yielded 1L of amber colored fluid. More fluid was present, but 1L removed due to patient's first time having a thoracentesis.  Pt tolerated procedure well. No immediate complications. Chest xray performed.  Specimen was sent for labs. CXR ordered. No evidence of pneumothorax.   EBL < 5 mL  Davan Nawabi N Leila Schuff PA-C 08/23/2023 1:14 PM

## 2023-08-23 NOTE — Progress Notes (Signed)
 [facility] CLINIC CARDIOLOGY PROGRESS NOTE       Patient ID: [name] MRN: [id number] DOB/AGE: [birth year] 78 y.o.  Admit date: 08/22/2023 Referring Physician Dr. Elspeth Masters Primary Physician [name], MD  Primary Cardiologist Dr. Florencio Reason for Consultation AoCHF  HPI: [name] is a 78 y.o. male  with a past medical history of CAD s/p CABG (LIMA-LAD, SVG - LCX 100% occluded, SVG - PDA s/p PCI (2021) with prior stents, paroxysmal AF (not anticoagulated), HFpEF (severe LVH, mild AS, moderate AI, moderate to severe MR, moderate TR by echo 09/2022) DM2, CKD, OSA  who presented to the ED on 08/22/2023 for worsening shortness of breath. Cardiology was consulted for further evaluation.   Interval history: -Patient seen and examined this morning.  States he is feeling slightly better today. -Reports his shortness of breath is slightly improved.  Walked around with PT and tolerated this okay. -Renal function slightly up today.  Reports good urine output. -BP and heart rate remained stable.   Review of systems complete and found to be negative unless listed above    Past Medical History:  Diagnosis Date   (HFpEF) heart failure with preserved ejection fraction (HCC) 07/25/2016   a.)TTE 07/25/16: EF >55, triv-mild pan regur, mild AS, G2DD; b.)TTE 09/02/16: EF >55, triv TR, mild AS; c.)TTE 11/05/16: EF >55, LAE, triv-mild pan regur, mild AS, G2DD; d.)TTE 01/21/20: EF >55, mod LVH, LAE/RVE, triv-mild AR/PR/TR, mod MR, mild AS, G2DD; e.)TTE 09/24/22: EF>55, sev LVH, sev LAE, RAE, mild PR, mod AR/MR/TR, mild-mod AS, RVSP 74.7; f.)TTE 12/23/22: EF 60-65, LAE, mild-mod MR/TR, mild AS   Abnormal urine 05/01/2023   Anemia    Aortic atherosclerosis (HCC)    Aortic stenosis 07/25/2016   a.) TTE 07/25/2016: mild (MPG 13.5); b.) TTE 09/02/2016: mild (MPG 16); c.) TTE 11/05/2016: mild (MPG 9); d.) TTE 01/21/2020: mild (MPG 11.7); e.) TTE 09/24/2022: mild-mod (MPG 20.3); f.) TTE 12/23/2022:  mild (MPG 15)   Arthritis    Atrial fibrillation (HCC)    a.) CHA2DS2VASc = 6 (age x2, CHF, HTN, vascular disease history, T2DM);  b.) rate/rhythm maintained on oral labetalol ; chronically anticoagulated with apixaban  + clopidogrel    AV block, 1st degree    B12 deficiency    CKD (chronic kidney disease), stage III (HCC)    Colon polyps    Congestive heart failure (HCC) 05/01/2023   Coronary artery disease 08/16/2016   a.) s/p 3v CABG 08/30/2016; b.) s/p PCI 11/26/2016 (DES x 1 oSVG-RCA); c.) s/p PCI 12/17/2016 (DES x 5 --> mLCx x2, dLCx, pLAD, dLAD); c.) s/p PCI 06/09/2020 (DES x1 --> ISR oSVG-PDA)   Cyst of kidney, acquired 07/03/2023   Dyspnea    Gastroesophageal reflux disease 05/01/2023   GERD (gastroesophageal reflux disease)    Hepatic steatosis    History of 2019 novel coronavirus disease (COVID-19) 09/02/2020   a.) Tx'd with remdesivir    History of blood transfusion    History of GI bleed 09/02/2020   a.) admitted to [facility] 09/02/2020 - 09/08/2020   History of heart artery stent 11/26/2016   TOTAL of 7 stents: a.) 11/26/2016 --> 3.5 x 22 mm Resolute Integrity oSVG-RCA; b.) 12/17/2016: 3.0 x 12 mm Xience Alpine dLCx, 3.5 x 22 mm Resolute Onyx mLCx, 3.5 x 18 mm Resolute Onyx mLCx, 3.0 x 26 mm Resolute Onyx pLAD, 2.0 x 26 mm Resolute Onyx dLAD; c.) 06/09/2020 --> 2.5 x 18 mm Resolute Onyx ISR oSVG-PDA   History of partial colectomy 08/12/2012  a.) large gastric hyperplastic polyp removal   HLD (hyperlipidemia)    Hypertension    LBBB (left bundle branch block)    Long term current use of anticoagulant    a.) apixaban    Long term current use of antithrombotics/antiplatelets    a.) clopidogrel    Male hypogonadism    a.) on exogenous TRT (1.62% gel)   Mild cardiomegaly    Mild gynecomastia (bilateral)    Nephrolithiasis    OSA (obstructive sleep apnea)    a.) does not utilize nocturnal PAP therapy   Personal history of surgery to heart and great vessels, presenting hazards to  health 05/01/2023   Proteinuria, unspecified 08/01/2023   Pulmonary hypertension (HCC) 09/24/2022   a.) TTE 09/24/2022: RVSP 74.7; b.) TTE 12/23/2022: RVSP 31.3   RBBB (right bundle branch block with left anterior fascicular block)    Right inguinal hernia    Right thyroid  nodule    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   a.) LIMA-LAD, SVG-PDA, SVG-OM3   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/01/2023   Type 2 diabetes mellitus treated with insulin  (HCC)    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   Umbilical hernia    Unstable angina (HCC)    Vitamin D  deficiency     Past Surgical History:  Procedure Laterality Date   APPENDECTOMY     CARDIAC CATHETERIZATION Left 08/16/2016   Procedure: Left Heart Cath and Coronary Angiography;  Surgeon: Cara JONETTA Lovelace, MD;  Location: [facility] INVASIVE CV LAB;  Service: Cardiovascular;  Laterality: Left;   COLONOSCOPY N/A 09/07/2020   Procedure: COLONOSCOPY;  Surgeon: Janalyn Keene NOVAK, MD;  Location: [facility] ENDOSCOPY;  Service: Endoscopy;  Laterality: N/A;   COLONOSCOPY WITH PROPOFOL  N/A 05/17/2021   Procedure: COLONOSCOPY WITH PROPOFOL ;  Surgeon: Janalyn Keene NOVAK, MD;  Location: [facility] ENDOSCOPY;  Service: Endoscopy;  Laterality: N/A;   CORONARY ANGIOPLASTY WITH STENT PLACEMENT Left 11/26/2016   Procedure: CORONARY ANGIOPLASTY WITH STENT PLACEMENT; Location: [facility]; Surgeon: Alm Dais, MD   CORONARY ARTERY BYPASS GRAFT N/A 08/30/2016   Procedure: CORONARY ARTERY BYPASS GRAFT; Location: [facility]; Surgeon: Juliene Pouch, MD   CORONARY STENT INTERVENTION N/A 06/09/2020   Procedure: CORONARY STENT INTERVENTION;  Surgeon: Lovelace Cara JONETTA, MD;  Location: [facility] CV LAB;  Service: Cardiovascular;  Laterality: N/A;   CORONARY STENT INTERVENTION Left 12/17/2016   Procedure: CORONARY STENT INTERVENTION; Location: [facility]; Surgeon: Alm Dais, MD   ENDOSCOPIC MUCOSAL RESECTION N/A 09/22/2020   Procedure: ENDOSCOPIC MUCOSAL  RESECTION;  Surgeon: [name] Aloha Raddle., MD;  Location: [facility] ENDOSCOPY;  Service: Gastroenterology;  Laterality: N/A;   ESOPHAGOGASTRODUODENOSCOPY N/A 09/07/2020   Procedure: ESOPHAGOGASTRODUODENOSCOPY (EGD);  Surgeon: Janalyn Keene NOVAK, MD;  Location: [facility] ENDOSCOPY;  Service: Endoscopy;  Laterality: N/A;   ESOPHAGOGASTRODUODENOSCOPY (EGD) WITH PROPOFOL  N/A 09/22/2020   Procedure: ESOPHAGOGASTRODUODENOSCOPY (EGD) WITH PROPOFOL ;  Surgeon: [name] Aloha Raddle., MD;  Location: [facility] ENDOSCOPY;  Service: Gastroenterology;  Laterality: N/A;   FRACTURE SURGERY     HEMOSTASIS CLIP PLACEMENT  09/22/2020   Procedure: HEMOSTASIS CLIP PLACEMENT;  Surgeon: [name] Aloha Raddle., MD;  Location: [facility] ENDOSCOPY;  Service: Gastroenterology;;   HEMOSTASIS CONTROL  09/22/2020   Procedure: HEMOSTASIS CONTROL;  Surgeon: [name] Aloha Raddle., MD;  Location: [facility] ENDOSCOPY;  Service: Gastroenterology;;   INSERTION OF MESH  02/19/2023   Procedure: INSERTION OF MESH;  Surgeon: Desiderio Schanz, MD;  Location: [facility] ORS;  Service: General;;  umbilical   LAPAROSCOPIC PARTIAL RIGHT COLECTOMY Right 08/12/2012   Procedure: LAPAROSCOPIC PARTIAL  RIGHT COLECTOMY; Location: [facility]; Surgeon: Unknown Sharps, MD   LEFT HEART CATH AND CORONARY ANGIOGRAPHY Left 06/09/2020   Procedure: LEFT HEART CATH AND CORONARY ANGIOGRAPHY;  Surgeon: Florencio Cara BIRCH, MD;  Location: [facility] CV [facility];  Service: Cardiovascular;  Laterality: Left;   LEFT HEART CATH AND CORS/GRAFTS ANGIOGRAPHY Left 11/20/2016   Procedure: Left Heart Cath and Cors/Grafts Angiography;  Surgeon: Cara BIRCH Florencio, MD;  Location: [facility] CV LAB;  Service: Cardiovascular;  Laterality: Left;   POLYPECTOMY  09/22/2020   Procedure: POLYPECTOMY;  Surgeon: Mansouraty, Aloha Raddle., MD;  Location: [facility] ENDOSCOPY;  Service: Gastroenterology;;   SUBMUCOSAL LIFTING INJECTION  09/22/2020   Procedure: SUBMUCOSAL LIFTING INJECTION;  Surgeon: [name] Aloha Raddle., MD;   Location: [facility] ENDOSCOPY;  Service: Gastroenterology;;   UMBILICAL HERNIA REPAIR N/A 02/19/2023   Procedure: HERNIA REPAIR UMBILICAL ADULT, open;  Surgeon: Desiderio Schanz, MD;  Location: [facility] ORS;  Service: General;  Laterality: N/A;   XI ROBOTIC ASSISTED INGUINAL HERNIA REPAIR WITH MESH Right 02/19/2023   Procedure: XI ROBOTIC ASSISTED INGUINAL HERNIA REPAIR WITH MESH;  Surgeon: Desiderio Schanz, MD;  Location: [facility] ORS;  Service: General;  Laterality: Right;    (Not in a hospital admission)  Social History   Socioeconomic History   Marital status: Married    Spouse name: Not on file   Number of children: Not on file   Years of education: Not on file   Highest education level: Not on file  Occupational History   Not on file  Tobacco Use   Smoking status: Former    Types: Cigarettes    Passive exposure: Never   Smokeless tobacco: Never  Vaping Use   Vaping status: Never Used  Substance and Sexual Activity   Alcohol use: No   Drug use: No   Sexual activity: Not Currently  Other Topics Concern   Not on file  Social History Narrative   Not on file   Social Drivers of Health   Financial Resource Strain: Not on file  Food Insecurity: No Food Insecurity (12/26/2022)   Hunger Vital Sign    Worried About Running Out of Food in the Last Year: Never true    Ran Out of Food in the Last Year: Never true  Transportation Needs: No Transportation Needs (12/26/2022)   PRAPARE - Administrator, Civil Service (Medical): No    Lack of Transportation (Non-Medical): No  Physical Activity: Not on file  Stress: Not on file  Social Connections: Not on file  Intimate Partner Violence: Not At Risk (12/26/2022)   Humiliation, Afraid, Rape, and Kick questionnaire    Fear of Current or Ex-Partner: No    Emotionally Abused: No    Physically Abused: No    Sexually Abused: No    History reviewed. No pertinent family history.   Vitals:   08/23/23 0215 08/23/23 0450 08/23/23 0633 08/23/23 0800   BP:  (!) 165/73  (!) 158/80  Pulse: 61 68  72  Resp: (!) 21 18  20   Temp:   97.9 F (36.6 C) 98.2 F (36.8 C)  TempSrc:   Oral Oral  SpO2: 99% 94%  96%  Weight:      Height:        PHYSICAL EXAM General: Chronically ill-appearing male, well nourished, in no acute distress. HEENT: Normocephalic and atraumatic. Neck: No JVD.  Lungs: Normal respiratory effort on room air.  Breath sounds diminished bilaterally Heart: HRRR. Normal S1 and S2 without gallops or murmurs.  Abdomen:  Non-distended appearing.  Msk: Normal strength and tone for age. Extremities: Warm and well perfused. No clubbing, cyanosis.  2+ edema, tense woody appearing lower extremities.  Neuro: Alert and oriented X 3. Psych: Answers questions appropriately.   Labs: Basic Metabolic Panel: Recent Labs    08/22/23 0730 08/23/23 0452  NA 136 141  K 4.5 3.9  CL 104 107  CO2 20* 21*  GLUCOSE 140* 114*  BUN 41* 43*  CREATININE 2.48* 2.63*  CALCIUM  10.3 10.5*   Liver Function Tests: Recent Labs    08/23/23 0452  AST 18  ALT 17  ALKPHOS 85  BILITOT 1.5*  PROT 6.9  ALBUMIN 4.1   No results for input(s): LIPASE, AMYLASE in the last 72 hours. CBC: Recent Labs    08/22/23 0730 08/22/23 1300 08/22/23 2030 08/23/23 0452  WBC 4.2  --   --   --   HGB 6.9*   < > 7.7* 7.5*  HCT 22.2*   < > 24.2* 23.6*  MCV 105.2*  --   --   --   PLT 103*  --   --   --    < > = values in this interval not displayed.   Cardiac Enzymes: Recent Labs    08/22/23 0730 08/22/23 1011  TROPONINIHS 23* 24*   BNP: Recent Labs    08/22/23 0730  BNP 1,069.2*   D-Dimer: No results for input(s): DDIMER in the last 72 hours. Hemoglobin A1C: No results for input(s): HGBA1C in the last 72 hours. Fasting Lipid Panel: No results for input(s): CHOL, HDL, LDLCALC, TRIG, CHOLHDL, LDLDIRECT in the last 72 hours. Thyroid  Function Tests: No results for input(s): TSH, T4TOTAL, T3FREE, THYROIDAB in the  last 72 hours.  Invalid input(s): FREET3 Anemia Panel: Recent Labs    08/22/23 1300  VITAMINB12 497  FOLATE 8.9  FERRITIN 125  TIBC 371  IRON  51  RETICCTPCT 3.8*     Radiology: DG Chest 2 View Result Date: 08/22/2023 CLINICAL DATA:  78 year old male with shortness of breath, fatigue. EXAM: CHEST - 2 VIEW COMPARISON:  CT Chest, Abdomen, and Pelvis 08/20/2023 and earlier. FINDINGS: PA and lateral views 0743 hours today. Stable cardiomegaly and mediastinal contours. Prior CABG. Ongoing bilateral pleural effusions, moderate and slightly larger on the right. No pneumothorax. Bilateral pulmonary vascular congestion is relatively mild, mild interstitial edema is possible. No air bronchograms. Overall ventilation not significantly changed from recent CT. No acute osseous abnormality identified. Negative visible bowel gas. IMPRESSION: Bilateral pleural effusions and interstitial edema. Ventilation not significantly changed from CT two days ago. Electronically Signed   By: VEAR Hurst M.D.   On: 08/22/2023 07:59   CT CHEST ABDOMEN PELVIS WO CONTRAST Result Date: 08/20/2023 CLINICAL DATA:  Follow-up pleural effusions and abdominal ascites. EXAM: CT CHEST, ABDOMEN AND PELVIS WITHOUT CONTRAST TECHNIQUE: Multidetector CT imaging of the chest, abdomen and pelvis was performed following the standard protocol without IV contrast. RADIATION DOSE REDUCTION: This exam was performed according to the departmental dose-optimization program which includes automated exposure control, adjustment of the mA and/or kV according to patient size and/or use of iterative reconstruction technique. COMPARISON:  Abdominal ultrasound 08/06/2023 FINDINGS: CT CHEST FINDINGS Cardiovascular: The heart is mildly enlarged. The aorta is normal in caliber. Scattered atherosclerotic calcifications. Three-vessel coronary artery calcifications and surgical changes from prior coronary artery bypass surgery. Mediastinum/Nodes: Several enlarged  mediastinal and hilar lymph nodes which could be related to the patient's lung findings and pleural effusions. The esophagus is grossly normal. There is  a 2.3 cm right thyroid  lobe lesion. This has been evaluated on previous imaging. (ref: J Am Coll Radiol. 2015 Feb;12(2): 143-50). Lungs/Pleura: Moderate-sized left pleural effusion and large right pleural effusion. Overlying atelectasis and moderate fluid in the left major fissure. No pulmonary edema. No worrisome pulmonary lesions. Musculoskeletal: Advanced degenerative changes involving the thoracic spine. Median sternotomy wires related to prior heart surgery. CT ABDOMEN PELVIS FINDINGS Hepatobiliary: Suspect cirrhotic changes involving the liver with irregular hepatic contour, dilated hepatic fissures and increased caudate to right lobe ratio. No hepatic lesions are identified without contrast peer small calcified granuloma noted in the right lobe. The gallbladder is moderately contracted. There is associated gallbladder wall thickening and pericholecystic fluid likely due to the patient's ascites. No biliary dilatation. Pancreas: No mass, inflammation or ductal dilatation. Spleen: Within normal limits in size.  No splenic lesions. Adrenals/Urinary Tract: The adrenal glands are normal. Simple bilateral renal cysts and bilateral renal calculi not requiring any further imaging evaluation or follow-up. The bladder is unremarkable. Stomach/Bowel: The stomach, duodenum, small bowel and colon grossly normal. Evidence of prior colonic surgery. Vascular/Lymphatic: Advanced atherosclerotic calcification involving the aorta, iliac arteries and branch vessels but no aneurysm. Borderline mesenteric and retroperitoneal lymph nodes likely related to the patient's ascites and cirrhosis. Reproductive: The prostate gland and seminal vesicles are unremarkable. Other: Small/moderate volume abdominal/pelvic ascites. Musculoskeletal: No significant bony findings. Advanced  degenerative changes involving the spine and moderate bilateral hip joint degenerative changes. IMPRESSION: 1. Moderate-sized left pleural effusion and large right pleural effusion. Overlying atelectasis and moderate fluid in the left major fissure. 2. Several enlarged mediastinal and hilar lymph nodes which could be related to the patient's lung findings and pleural effusions. 3. Cirrhotic changes involving the liver. No hepatic lesions are identified without contrast. 4. Small/moderate volume abdominal/pelvic ascites. 5. Gallbladder wall thickening and pericholecystic fluid likely due to the patient's ascites and cirrhosis. 6. Advanced atherosclerotic calcification.  No aneurysm. Aortic Atherosclerosis (ICD10-I70.0). Electronically Signed   By: MYRTIS Stammer M.D.   On: 08/20/2023 14:48   US  ABDOMEN LIMITED RUQ (LIVER/GB) Result Date: 08/06/2023 CLINICAL DATA:  Evaluate for ascites.  Elevated LFTs. EXAM: ULTRASOUND ABDOMEN LIMITED RIGHT UPPER QUADRANT COMPARISON:  Ultrasound abdomen 09/19/2022 FINDINGS: Gallbladder: Multiple gallbladder polyps measuring up to 8 mm. Small stones in the gallbladder lumen. No gallbladder wall thickening or pericholecystic fluid. Common bile duct: Diameter: 3.1 mm Liver: Mild coarsened hepatic parenchymal echogenicity. No focal lesion. Portal vein is patent on color Doppler imaging with normal direction of blood flow towards the liver. Other: Bilateral pleural effusions. Upper abdominal ascites. 2.1 cm cyst exophytic superior pole right kidney. No imaging follow-up needed. IMPRESSION: 1. Upper abdominal ascites. 2. Bilateral pleural effusions. 3. Cholelithiasis without secondary signs of acute cholecystitis. 4. Multiple gallbladder polyps measuring up to 8 mm. Recommend follow-up ultrasound in 6 months. Electronically Signed   By: Bard Moats M.D.   On: 08/06/2023 09:06    ECHO pending  TELEMETRY reviewed by me 08/23/2023: Not on telemetry  EKG reviewed by me: Atrial  fibrillation rate 59 bpm  Data reviewed by me 08/23/2023: last 24h vitals tele labs imaging I/O hospitalist progress note  Principal Problem:   CHF exacerbation (HCC) Active Problems:   Essential hypertension   OSA (obstructive sleep apnea)   CAD (coronary artery disease), native coronary artery   Hyperlipidemia associated with type 2 diabetes mellitus (HCC)   Acute blood loss anemia   Acute heart failure with preserved ejection fraction (HFpEF) (HCC)   Paroxysmal atrial  fibrillation (HCC)   Acute kidney injury superimposed on chronic kidney disease (HCC)   Upper GI bleeding   Pleural effusion   Symptomatic anemia    ASSESSMENT AND PLAN:  [name] is a 78 y.o. male  with a past medical history of CAD s/p CABG (LIMA-LAD, SVG - LCX 100% occluded, SVG - PDA s/p PCI (2021) with prior stents, paroxysmal AF (not anticoagulated), HFpEF (severe LVH, mild AS, moderate AI, moderate to severe MR, moderate TR by echo 09/2022) DM2, CKD, OSA  who presented to the ED on 08/22/2023 for worsening shortness of breath. Cardiology was consulted for further evaluation.   # Acute on chronic HFpEF # Coronary artery disease Presenting with worsening shortness of breath over the last few weeks.  Also noticed increased weight and lower extremity edema.  BNP elevated at 1069.  Troponins trended 23 > 24.  CXR with bilateral pleural effusions and interstitial edema. -Echo pending -Continue diuresis with IV Lasix  60 mg twice daily. -Resume home labetalol  100 mg twice daily.  -Primary team to set up thoracentesis with IR. -Continue rosuvastatin  40 mg daily.   -Heart failure pharmacist to see.  -No plan for further cardiac diagnostics.  # Upper GI bleed Patient recently undergoing workup outpatient for upper GI bleed.  Planning to have EGD outpatient.  Hemoglobin down to 6.9 today. -S/p transfusion yesterday. -GI following.  # Paroxysmal atrial fibrillation  Patient with a history of paroxysmal atrial  fibrillation on labetalol  at home for rate control as well as Eliquis .  Denies any palpitation symptoms. -Will hold off on resuming home labetalol  given acute heart failure exacerbation.  Plan to resume within the next day or 2. -Eliquis  held given GIB.    This patient's plan of care was discussed and created with Dr. Ammon and he is in agreement.  Signed: Danita Bloch, PA-C  08/23/2023, 10:42 AM [facility] Cardiology

## 2023-08-23 NOTE — Progress Notes (Addendum)
 PROGRESS NOTE    [name]   [name] DOB: 1946-04-09  DOA: 08/22/2023 Date of Service: 08/23/23 which is hospital day 1  PCP: [name], MD    Hospital course / significant events:   HPI: [name] is a 78 y.o. male with medical history significant of CAD s/p CABG and DES, severe pulmonary hypertension, hypertension, hyperlipidemia, OSA, type 2 diabetes, hypothyroidism, paroxysmal Afib, HFpEF (severe LVH, mild AS, moderate AI, moderate to severe MR, moderate TR by echo 09/2022). To ED 08/22/23 c/o worsening fatigue over the past 3-4+ months, worsening orthopnea, PND, LE swelling.  Was evaluated by GI on 08/20/2023 - concern for upper GI bleeding. Baseline hgb 10. Hgb 8 during that evaluation, recs for EGD pending cardiology evaulation in setting of HFpEF etc. Pt states that stools are chronically dark, more dark over the past several weeks.   01/09: admitted to hospitalist service w/ acute on chronic HFpEF, upper GIB, ABLA (Hgb 6.9), AKI on CKD (Cr 2.5, baseline around 1.8). Also of note - moderate R and large L pleural effusion on CT imaging 08/19/2022 in setting of acute on chronic HFpEF. Per GI - holding any endoscopic eval/tx for now pending cardio eval / improvement in fluids status. Per cardiology - diurese, no diagnostics. IR consult for thoracentesis 01/10: AKI no improvement (Cr 2.63), Hgb improved to 7.5. Echo EF 60-65% and normal diastolic parameters. Per GI - reassess pending improvement in cardiopulm status. Per cardiology - continue diuresis. Approx net out 1800 mL. R thoracentesis yield 1L fluid, more was present but only 1L removed.    Consultants:  Gastroenterology Cardiology  IR  Procedures/Surgeries: 08/23/23 R thoracentesis       ASSESSMENT & PLAN:   Upper GI bleeding ABLA PRBC transfusion  IV PPI bid GI following - holding any endoscopic eval/tx for now but anticipate will need scope prior to discharge from hospital Hold anticoagulation,  antiplatelet, NSAID Trend CBC/HH  Transfuse for hgb <7  page GI with any acute hemodynamic changes, or signs of active GI bleeding  CLD pending possible scope - if GI ok for solids please order or inform attending   Acute on chronic heart failure with preserved ejection fraction (HFpEF)  2D ECHO 12/2022 w/ EF 60-65%  Echo today also EF 60-65% and normal diastolic  Noted symptomatic anemia w/ hgb 6.9 likely confounding issue  diuresis Lasix  IV 60 mg bid Cardiology consult - no invasive diagnostics or stress test at this time  Strict Is and Os and daily weights   Statin, holding ASA d/t GIB, holding ACE/ARB d/t AKI, holding beta blocker  Heart failure pharmacist to assess - consider jardiance  if renal function improved   CAD (coronary artery disease), native coronary artery Cont home regimen- hold antiplatelet regimen in setting of concern for upper GI bleeding     Pleural effusion moderate on R large on L  diuresis  Consult to IR for thoracentesis    Acute kidney injury superimposed on chronic kidney disease stage 3a ? Component of cardiorenal syndrome  Diuresis and PRBC transfusion to improve renal perfusion Monitor renal function    Paroxysmal atrial fibrillation (HCC) Rate controlled  Hold eliquis  in setting of concern for upper GIB  Holding beta blocker but plan to resume per cardiology    Hyperlipidemia associated with CAD, DM2 Resume statin once taking po     OSA (obstructive sleep apnea) CPAP     Essential hypertension Monitor w/ diuresis  overweight based on BMI: Body mass index is 28.8 kg/m.  Underweight - under 18  overweight - 25 to 29 obese - 30 or more Class 1 obesity: BMI of 30.0 to 34 Class 2 obesity: BMI of 35.0 to 39 Class 3 obesity: BMI of 40.0 to 49 Super Morbid Obesity: BMI 50-59 Super-super Morbid Obesity: BMI 60+ Significantly low or high BMI is associated with higher medical risk.  Weight management advised as adjunct to other  disease management and risk reduction treatments    DVT prophylaxis: SCD IV fluids: no continuous IV fluids while diuresing Nutrition: CLD pending possible scope - if GI ok for solids please order or inform attending Central lines / invasive devices: none  Code Status: FULL CODE ACP documentation reviewed:  none on file in [facility]  TOC needs: TBD Barriers to dispo / significant pending items: workup and treatment of acute illness(es) as above             Subjective / Brief ROS:  Patient reports still some SOB but better  Denies CP/SOB.  Pain controlled.  Denies new weakness.  Tolerating diet.  Reports no concerns w/ urination/defecation.   Family Communication: none at this time     Objective Findings:  Vitals:   08/23/23 0800 08/23/23 1200 08/23/23 1203 08/23/23 1557  BP: (!) 158/80 (!) 152/85 (!) 161/75 (!) 158/80  Pulse: 72 80 77 80  Resp: 20 18 18 20   Temp: 98.2 F (36.8 C) 98.4 F (36.9 C)  98.7 F (37.1 C)  TempSrc: Oral Oral  Oral  SpO2: 96% 94% 97% 98%  Weight:      Height:        Intake/Output Summary (Last 24 hours) at 08/23/2023 1615 Last data filed at 08/23/2023 0905 Gross per 24 hour  Intake 525.33 ml  Output 2375 ml  Net -1849.67 ml   Filed Weights   08/22/23 0727  Weight: 99 kg    Examination:  Physical Exam Cardiovascular:     Rate and Rhythm: Normal rate.     Heart sounds: Murmur heard.  Pulmonary:     Effort: Pulmonary effort is normal.     Breath sounds: Examination of the right-lower field reveals rales. Examination of the left-lower field reveals rales. Rales present.  Musculoskeletal:     Right lower leg: Edema present.     Left lower leg: Edema present.  Skin:    General: Skin is warm and dry.  Neurological:     General: No focal deficit present.     Mental Status: He is alert and oriented to person, place, and time.  Psychiatric:        Mood and Affect: Mood normal.        Behavior: Behavior normal.           Scheduled Medications:   furosemide   60 mg Intravenous BID   labetalol   100 mg Oral BID   pantoprazole  (PROTONIX ) IV  40 mg Intravenous Q12H   rosuvastatin   40 mg Oral Daily   sodium chloride  flush  3 mL Intravenous Q12H    Continuous Infusions:    PRN Medications:  ondansetron  **OR** ondansetron  (ZOFRAN ) IV, sodium chloride  flush  Antimicrobials from admission:  Anti-infectives (From admission, onward)    None           Data Reviewed:  I have personally reviewed the following...  CBC: Recent Labs  Lab 08/22/23 0730 08/22/23 1300 08/22/23 2030 08/23/23 0452  WBC 4.2  --   --   --  HGB 6.9* 7.2* 7.7* 7.5*  HCT 22.2* 22.5* 24.2* 23.6*  MCV 105.2*  --   --   --   PLT 103*  --   --   --    Basic Metabolic Panel: Recent Labs  Lab 08/22/23 0730 08/23/23 0452  NA 136 141  K 4.5 3.9  CL 104 107  CO2 20* 21*  GLUCOSE 140* 114*  BUN 41* 43*  CREATININE 2.48* 2.63*  CALCIUM  10.3 10.5*   GFR: Estimated Creatinine Clearance: 30 mL/min (A) (by C-G formula based on SCr of 2.63 mg/dL (H)). Liver Function Tests: Recent Labs  Lab 08/23/23 0452  AST 18  ALT 17  ALKPHOS 85  BILITOT 1.5*  PROT 6.9  ALBUMIN 4.1   No results for input(s): LIPASE, AMYLASE in the last 168 hours. No results for input(s): AMMONIA in the last 168 hours. Coagulation Profile: No results for input(s): INR, PROTIME in the last 168 hours. Cardiac Enzymes: No results for input(s): CKTOTAL, CKMB, CKMBINDEX, TROPONINI in the last 168 hours. BNP (last 3 results) No results for input(s): PROBNP in the last 8760 hours. HbA1C: No results for input(s): HGBA1C in the last 72 hours. CBG: No results for input(s): GLUCAP in the last 168 hours. Lipid Profile: No results for input(s): CHOL, HDL, LDLCALC, TRIG, CHOLHDL, LDLDIRECT in the last 72 hours. Thyroid  Function Tests: No results for input(s): TSH, T4TOTAL, FREET4, T3FREE,  THYROIDAB in the last 72 hours. Anemia Panel: Recent Labs    08/22/23 1300  VITAMINB12 497  FOLATE 8.9  FERRITIN 125  TIBC 371  IRON  51  RETICCTPCT 3.8*   Most Recent Urinalysis On File:     Component Value Date/Time   COLORURINE YELLOW (A) 12/20/2022 1258   APPEARANCEUR HAZY (A) 12/20/2022 1258   LABSPEC 1.013 12/20/2022 1258   PHURINE 7.0 12/20/2022 1258   GLUCOSEU NEGATIVE 12/20/2022 1258   HGBUR NEGATIVE 12/20/2022 1258   BILIRUBINUR NEGATIVE 12/20/2022 1258   KETONESUR NEGATIVE 12/20/2022 1258   PROTEINUR >=300 (A) 12/20/2022 1258   NITRITE NEGATIVE 12/20/2022 1258   LEUKOCYTESUR NEGATIVE 12/20/2022 1258   Sepsis Labs: @LABRCNTIP (procalcitonin:4,lacticidven:4) Microbiology: No results found for this or any previous visit (from the past 240 hours).    Radiology Studies last 3 days: US  THORACENTESIS ASP PLEURAL SPACE W/IMG GUIDE Result Date: 08/23/2023 INDICATION: Patient is a 78 y/o male with history of CAD- s/p CABG, pulmonary HTN, HTN, OSA, type 2 diabetes mellitus, hypothyroidism, heart failure, anemia, and kidney disease. Patient presents for a diagnostic and therapeutic thoracentesis due to bilateral pleural effusions. EXAM: ULTRASOUND GUIDED right sided THORACENTESIS MEDICATIONS: 10mL lidocaine  1% COMPLICATIONS: None immediate. PROCEDURE: An ultrasound guided thoracentesis was thoroughly discussed with the patient and questions answered. The benefits, risks, alternatives and complications were also discussed. The patient understands and wishes to proceed with the procedure. Written consent was obtained. Ultrasound was performed to localize and mark an adequate pocket of fluid in the right chest. The area was then prepped and draped in the normal sterile fashion. 1% Lidocaine  was used for local anesthesia. Under ultrasound guidance a 6 Fr Safe-T-Centesis catheter was introduced. Thoracentesis was performed. The catheter was removed and a dressing applied. FINDINGS: A  total of approximately 1L of amber colored fluid was removed. Samples were sent to the laboratory as requested by the clinical team. IMPRESSION: Successful ultrasound guided right thoracentesis yielding 1L of pleural fluid. Performed by Daved Hipp PA-C Electronically Signed   By: Wilkie Lent M.D.   On: 08/23/2023 13:32  DG Chest Port 1 View Result Date: 08/23/2023 CLINICAL DATA:  Pleural effusion.  Status post right thoracentesis. EXAM: PORTABLE CHEST 1 VIEW COMPARISON:  Chest radiographs 08/22/2023 FINDINGS: The cardiac silhouette remains enlarged. Aortic atherosclerosis and previous CABG are again noted. Mild pulmonary vascular congestion is similar to the prior study. There are persistent small bilateral pleural effusions with bibasilar atelectasis. No pneumothorax is identified. IMPRESSION: Persistent small bilateral pleural effusions and bibasilar atelectasis. No pneumothorax. Electronically Signed   By: Dasie Hamburg M.D.   On: 08/23/2023 13:00   ECHOCARDIOGRAM COMPLETE Result Date: 08/23/2023    ECHOCARDIOGRAM REPORT   Patient Name:   [name] Date of Exam: 08/23/2023 Medical Rec #:  [id number]        Height:       73.0 in Accession #:    [id number]       Weight:       218.3 lb Date of Birth:  [birth year]         BSA:          2.233 m Patient Age:    77 years         BP:           165/73 mmHg Patient Gender: M                HR:           68 bpm. Exam Location:  [facility] Procedure: 2D Echo, Cardiac Doppler and Color Doppler Indications:     CHF-acute systolic I50.21  History:         Patient has prior history of Echocardiogram examinations, most                  recent 12/23/2022. Arrythmias:LBBB; Signs/Symptoms:Dyspnea.  Sonographer:     Christopher Furnace Referring Phys:  [id number] ELSPETH PARAS NEWTON Diagnosing Phys: Marsa Dooms MD IMPRESSIONS  1. Left ventricular ejection fraction, by estimation, is 60 to 65%. The left ventricle has normal function. The left ventricle has no regional wall motion  abnormalities. Left ventricular diastolic parameters were normal.  2. Right ventricular systolic function is normal. The right ventricular size is normal.  3. Left atrial size was mildly dilated.  4. Right atrial size was mildly dilated.  5. The mitral valve is normal in structure. Mild to moderate mitral valve regurgitation. No evidence of mitral stenosis.  6. The aortic valve is normal in structure. Aortic valve regurgitation is mild to moderate. Mild to moderate aortic valve stenosis.  7. The inferior vena cava is normal in size with greater than 50% respiratory variability, suggesting right atrial pressure of 3 mmHg. FINDINGS  Left Ventricle: Left ventricular ejection fraction, by estimation, is 60 to 65%. The left ventricle has normal function. The left ventricle has no regional wall motion abnormalities. The left ventricular internal cavity size was normal in size. There is  no left ventricular hypertrophy. Left ventricular diastolic parameters were normal. Right Ventricle: The right ventricular size is normal. No increase in right ventricular wall thickness. Right ventricular systolic function is normal. Left Atrium: Left atrial size was mildly dilated. Right Atrium: Right atrial size was mildly dilated. Pericardium: Trivial pericardial effusion is present. Mitral Valve: The mitral valve is normal in structure. Mild to moderate mitral valve regurgitation. No evidence of mitral valve stenosis. Tricuspid Valve: The tricuspid valve is normal in structure. Tricuspid valve regurgitation is not demonstrated. No evidence of tricuspid stenosis. Aortic Valve: The aortic valve is normal in structure. Aortic valve regurgitation is  mild to moderate. Mild to moderate aortic stenosis is present. Aortic valve mean gradient measures 12.7 mmHg. Aortic valve peak gradient measures 22.0 mmHg. Aortic valve  area, by VTI measures 1.57 cm. Pulmonic Valve: The pulmonic valve was normal in structure. Pulmonic valve regurgitation is  not visualized. No evidence of pulmonic stenosis. Aorta: The aortic root is normal in size and structure. Venous: The inferior vena cava is normal in size with greater than 50% respiratory variability, suggesting right atrial pressure of 3 mmHg. IAS/Shunts: No atrial level shunt detected by color flow Doppler.  LEFT VENTRICLE PLAX 2D LVIDd:         5.10 cm   Diastology LVIDs:         3.10 cm   LV e' medial:    6.09 cm/s LV PW:         0.80 cm   LV E/e' medial:  21.7 LV IVS:        1.40 cm   LV e' lateral:   13.80 cm/s LVOT diam:     2.00 cm   LV E/e' lateral: 9.6 LV SV:         73 LV SV Index:   33 LVOT Area:     3.14 cm  RIGHT VENTRICLE RV Basal diam:  4.60 cm RV Mid diam:    4.00 cm LEFT ATRIUM             Index        RIGHT ATRIUM           Index LA diam:        4.80 cm 2.15 cm/m   RA Area:     21.80 cm LA Vol (A2C):   71.1 ml 31.84 ml/m  RA Volume:   64.80 ml  29.02 ml/m LA Vol (A4C):   68.6 ml 30.72 ml/m LA Biplane Vol: 74.2 ml 33.23 ml/m  AORTIC VALVE AV Area (Vmax):    1.33 cm AV Area (Vmean):   1.38 cm AV Area (VTI):     1.57 cm AV Vmax:           234.67 cm/s AV Vmean:          163.667 cm/s AV VTI:            0.466 m AV Peak Grad:      22.0 mmHg AV Mean Grad:      12.7 mmHg LVOT Vmax:         99.00 cm/s LVOT Vmean:        71.700 cm/s LVOT VTI:          0.232 m LVOT/AV VTI ratio: 0.50  AORTA Ao Root diam: 3.40 cm MITRAL VALVE                TRICUSPID VALVE MV Area (PHT): 4.24 cm     TR Peak grad:   40.4 mmHg MV Decel Time: 179 msec     TR Vmax:        318.00 cm/s MV E velocity: 132.00 cm/s MV A velocity: 50.30 cm/s   SHUNTS MV E/A ratio:  2.62         Systemic VTI:  0.23 m                             Systemic Diam: 2.00 cm Marsa Dooms MD Electronically signed by Marsa Dooms MD Signature Date/Time: 08/23/2023/11:45:16 AM    Final    DG Chest  2 View Result Date: 08/22/2023 CLINICAL DATA:  78 year old male with shortness of breath, fatigue. EXAM: CHEST - 2 VIEW COMPARISON:  CT Chest,  Abdomen, and Pelvis 08/20/2023 and earlier. FINDINGS: PA and lateral views 0743 hours today. Stable cardiomegaly and mediastinal contours. Prior CABG. Ongoing bilateral pleural effusions, moderate and slightly larger on the right. No pneumothorax. Bilateral pulmonary vascular congestion is relatively mild, mild interstitial edema is possible. No air bronchograms. Overall ventilation not significantly changed from recent CT. No acute osseous abnormality identified. Negative visible bowel gas. IMPRESSION: Bilateral pleural effusions and interstitial edema. Ventilation not significantly changed from CT two days ago. Electronically Signed   By: VEAR Hurst M.D.   On: 08/22/2023 07:59   CT CHEST ABDOMEN PELVIS WO CONTRAST Result Date: 08/20/2023 CLINICAL DATA:  Follow-up pleural effusions and abdominal ascites. EXAM: CT CHEST, ABDOMEN AND PELVIS WITHOUT CONTRAST TECHNIQUE: Multidetector CT imaging of the chest, abdomen and pelvis was performed following the standard protocol without IV contrast. RADIATION DOSE REDUCTION: This exam was performed according to the departmental dose-optimization program which includes automated exposure control, adjustment of the mA and/or kV according to patient size and/or use of iterative reconstruction technique. COMPARISON:  Abdominal ultrasound 08/06/2023 FINDINGS: CT CHEST FINDINGS Cardiovascular: The heart is mildly enlarged. The aorta is normal in caliber. Scattered atherosclerotic calcifications. Three-vessel coronary artery calcifications and surgical changes from prior coronary artery bypass surgery. Mediastinum/Nodes: Several enlarged mediastinal and hilar lymph nodes which could be related to the patient's lung findings and pleural effusions. The esophagus is grossly normal. There is a 2.3 cm right thyroid  lobe lesion. This has been evaluated on previous imaging. (ref: J Am Coll Radiol. 2015 Feb;12(2): 143-50). Lungs/Pleura: Moderate-sized left pleural effusion and large right  pleural effusion. Overlying atelectasis and moderate fluid in the left major fissure. No pulmonary edema. No worrisome pulmonary lesions. Musculoskeletal: Advanced degenerative changes involving the thoracic spine. Median sternotomy wires related to prior heart surgery. CT ABDOMEN PELVIS FINDINGS Hepatobiliary: Suspect cirrhotic changes involving the liver with irregular hepatic contour, dilated hepatic fissures and increased caudate to right lobe ratio. No hepatic lesions are identified without contrast peer small calcified granuloma noted in the right lobe. The gallbladder is moderately contracted. There is associated gallbladder wall thickening and pericholecystic fluid likely due to the patient's ascites. No biliary dilatation. Pancreas: No mass, inflammation or ductal dilatation. Spleen: Within normal limits in size.  No splenic lesions. Adrenals/Urinary Tract: The adrenal glands are normal. Simple bilateral renal cysts and bilateral renal calculi not requiring any further imaging evaluation or follow-up. The bladder is unremarkable. Stomach/Bowel: The stomach, duodenum, small bowel and colon grossly normal. Evidence of prior colonic surgery. Vascular/Lymphatic: Advanced atherosclerotic calcification involving the aorta, iliac arteries and branch vessels but no aneurysm. Borderline mesenteric and retroperitoneal lymph nodes likely related to the patient's ascites and cirrhosis. Reproductive: The prostate gland and seminal vesicles are unremarkable. Other: Small/moderate volume abdominal/pelvic ascites. Musculoskeletal: No significant bony findings. Advanced degenerative changes involving the spine and moderate bilateral hip joint degenerative changes. IMPRESSION: 1. Moderate-sized left pleural effusion and large right pleural effusion. Overlying atelectasis and moderate fluid in the left major fissure. 2. Several enlarged mediastinal and hilar lymph nodes which could be related to the patient's lung findings  and pleural effusions. 3. Cirrhotic changes involving the liver. No hepatic lesions are identified without contrast. 4. Small/moderate volume abdominal/pelvic ascites. 5. Gallbladder wall thickening and pericholecystic fluid likely due to the patient's ascites and cirrhosis. 6. Advanced atherosclerotic calcification.  No aneurysm. Aortic Atherosclerosis (  ICD10-I70.0). Electronically Signed   By: MYRTIS Stammer M.D.   On: 08/20/2023 14:48      [facility] 08/23/2023, 4:15 PM    Dictation software may have been used to generate the above note. Typos may occur and escape review in typed/dictated notes. Please contact Dr Marsa directly for clarity if needed.  Staff may message me via secure chat in Epic  but this may not receive an immediate response,  please page me for urgent matters!  If 7PM-7AM, please contact night coverage [email or web address]

## 2023-08-23 NOTE — Hospital Course (Addendum)
 Hospital course / significant events:   HPI: [name] is a 78 y.o. male with medical history significant of CAD s/p CABG and DES, severe pulmonary hypertension, hypertension, hyperlipidemia, OSA, type 2 diabetes, hypothyroidism, paroxysmal Afib, HFpEF (severe LVH, mild AS, moderate AI, moderate to severe MR, moderate TR by echo 09/2022). To ED 08/22/23 c/o worsening fatigue over the past 3-4+ months, worsening orthopnea, PND, LE swelling.  Was evaluated by GI on 08/20/2023 - concern for upper GI bleeding. Baseline hgb 10. Hgb 8 during that evaluation, recs for EGD pending cardiology evaulation in setting of HFpEF etc. Pt states that stools are chronically dark, more dark over the past several weeks.   01/09: admitted to hospitalist service w/ acute on chronic HFpEF, upper GIB, ABLA (Hgb 6.9), AKI on CKD (Cr 2.5, baseline around 1.8). Also of note - moderate R and large L pleural effusion on CT imaging 08/19/2022 in setting of acute on chronic HFpEF. Per GI - holding any endoscopic eval/tx for now pending cardio eval / improvement in fluids status. Per cardiology - diurese, no diagnostics. IR consult for thoracentesis 01/10: AKI no improvement (Cr 2.63), Hgb improved to 7.5. Echo EF 60-65% and normal diastolic parameters. Per GI - reassess pending improvement in cardiopulm status. Per cardiology - continue diuresis. Total UOP approx 3L. R thoracentesis yield 1L fluid, more was present but only 1L removed.  01/11: Cr going up, holding diuresis.   01/12: Cr up to 3.01, Hgb to 7.3 question hypoperfusion - will follow FeNa and UA, 1 unit PRBC. EGD tm 01/13:  EGD stblae gastric polyps no bleeding. AKI seems to be improved after PRBC transfusion and holding lasix   01/14: renal function not improved, Hgb 7.9 no active bleeding. Will monitor labs into tomorrow - resume diuresis per cardiology vs may need to have nephrology assess, hold off on PRBC transfusion now and monitor if still dropping may need further GI  w/u  Consultants:  Gastroenterology Cardiology  IR  Procedures/Surgeries: 08/23/23 R thoracentesis       ASSESSMENT & PLAN:   Upper GI bleeding ABLA PRBC transfusion x1 unit 01/09, 1 unit 01/12 EGD 01/13 stable gastric polyps, no current GI bleed IV PPI bid GI following Hold anticoagulation, antiplatelet, NSAID - likely will have to hold antiplatelet/anticoag indefinitely  Trend CBC/HH  page GI with any acute hemodynamic changes, or signs of active GI bleeding  hold off on PRBC transfusion now and monitor if still dropping may need further GI w/u  Acute kidney injury superimposed on chronic kidney disease stage 3a ? Component of cardiorenal syndrome but not really improved w/ diuresis, seems to be improved after PRBC transfusion and holding lasix   Holding lasix  for now  Monitor renal function  FeNa and UA pending urine collection  resume diuresis per cardiology vs may need to have nephrology assess   Acute on chronic heart failure with preserved ejection fraction (HFpEF)  2D ECHO 12/2022 w/ EF 60-65%  Echo this admission also EF 60-65% and normal diastolic w/ mod-severe MR, mod-severe TR, mod AI, may be contributing to symptoms diuresis held past 2 days and today d/t AKI, may consider resume po tomorrow  Cardiology consult - no invasive diagnostics or stress test at this time  Strict Is and Os and daily weights   Statin Beta blocker  holding ASA d/t GIB holding ACE/ARB d/t AKI Consider jardiance  if renal function improves resume diuresis per cardiology vs may need to have nephrology assess,   CAD (coronary artery disease), native  coronary artery hold antiplatelet regimen in setting of concern for upper GI bleeding  Other cardiac meds as above     Pleural effusion moderate on R large on L  S/p R thoracentesis 01/10 May need to repeat d/t residual fluid, see procedure note     Paroxysmal atrial fibrillation  Rate controlled  Hold eliquis  in setting of concern for  upper GIB  Labetalol  resumed per cardiology, slightly low HR will add holding parameters for bradycardia    Hypokalemia Replace as needed Monitor BMP  Hyperlipidemia associated with CAD, DM2 statin    OSA (obstructive sleep apnea) CPAP qhs    Essential hypertension Monitor w/ diuresis           overweight based on BMI: Body mass index is 28.8 kg/m.  Underweight - under 18  overweight - 25 to 29 obese - 30 or more Class 1 obesity: BMI of 30.0 to 34 Class 2 obesity: BMI of 35.0 to 39 Class 3 obesity: BMI of 40.0 to 49 Super Morbid Obesity: BMI 50-59 Super-super Morbid Obesity: BMI 60+ Significantly low or high BMI is associated with higher medical risk.  Weight management advised as adjunct to other disease management and risk reduction treatments    DVT prophylaxis: SCD IV fluids: no continuous IV fluids d/t concern for CHF Nutrition: cardiac diet now  Central lines / invasive devices: none  Code Status: FULL CODE ACP documentation reviewed:  none on file in [facility]  TOC needs: TBD Barriers to dispo / significant pending items: AKI, if improving can likely restart lasix  po and dc next 1-2 days

## 2023-08-23 NOTE — Progress Notes (Signed)
 I went to see the patient in the emergency department but he was not in his room today.  It was reported that he went to radiology for a thoracentesis.  His respiratory status has been reported to be improved. His hemoglobin has remained stable after his transfusion. The patient will likely need a upper endoscopy during this hospital stay when his respiratory status has improved. Dr. Unk will be following the patient this weekend and decide when the optimal time for his upper endoscopy will be.

## 2023-08-24 ENCOUNTER — Inpatient Hospital Stay: Payer: Medicare HMO

## 2023-08-24 DIAGNOSIS — I5033 Acute on chronic diastolic (congestive) heart failure: Secondary | ICD-10-CM | POA: Diagnosis not present

## 2023-08-24 LAB — HEPATITIS PANEL, ACUTE
HCV Ab: NONREACTIVE
Hep A IgM: NONREACTIVE
Hep B C IgM: NONREACTIVE
Hepatitis B Surface Ag: NONREACTIVE

## 2023-08-24 LAB — CBC
HCT: 24.3 % — ABNORMAL LOW (ref 39.0–52.0)
Hemoglobin: 7.7 g/dL — ABNORMAL LOW (ref 13.0–17.0)
MCH: 33.2 pg (ref 26.0–34.0)
MCHC: 31.7 g/dL (ref 30.0–36.0)
MCV: 104.7 fL — ABNORMAL HIGH (ref 80.0–100.0)
Platelets: 95 10*3/uL — ABNORMAL LOW (ref 150–400)
RBC: 2.32 MIL/uL — ABNORMAL LOW (ref 4.22–5.81)
RDW: 17.9 % — ABNORMAL HIGH (ref 11.5–15.5)
WBC: 3.9 10*3/uL — ABNORMAL LOW (ref 4.0–10.5)
nRBC: 0 % (ref 0.0–0.2)

## 2023-08-24 LAB — BASIC METABOLIC PANEL
Anion gap: 11 (ref 5–15)
BUN: 47 mg/dL — ABNORMAL HIGH (ref 8–23)
CO2: 25 mmol/L (ref 22–32)
Calcium: 10.3 mg/dL (ref 8.9–10.3)
Chloride: 105 mmol/L (ref 98–111)
Creatinine, Ser: 2.97 mg/dL — ABNORMAL HIGH (ref 0.61–1.24)
GFR, Estimated: 21 mL/min — ABNORMAL LOW (ref >60)
Glucose, Bld: 120 mg/dL — ABNORMAL HIGH (ref 70–99)
Potassium: 3.4 mmol/L — ABNORMAL LOW (ref 3.5–5.1)
Sodium: 141 mmol/L (ref 135–145)

## 2023-08-24 LAB — HEMOGLOBIN AND HEMATOCRIT, BLOOD
HCT: 22.7 % — ABNORMAL LOW (ref 39.0–52.0)
Hemoglobin: 7.3 g/dL — ABNORMAL LOW (ref 13.0–17.0)

## 2023-08-24 MED ORDER — HYDRALAZINE HCL 20 MG/ML IJ SOLN
5.0000 mg | INTRAMUSCULAR | Status: DC | PRN
  Administered 2023-08-26: 5 mg via INTRAVENOUS
  Filled 2023-08-24: qty 1

## 2023-08-24 NOTE — Progress Notes (Signed)
 PROGRESS NOTE    [name]   [name] DOB: 1946-07-24  DOA: 08/22/2023 Date of Service: 08/24/23 which is hospital day 2  PCP: [name], MD    Hospital course / significant events:   HPI: [name] is a 78 y.o. male with medical history significant of CAD s/p CABG and DES, severe pulmonary hypertension, hypertension, hyperlipidemia, OSA, type 2 diabetes, hypothyroidism, paroxysmal Afib, HFpEF (severe LVH, mild AS, moderate AI, moderate to severe MR, moderate TR by echo 09/2022). To ED 08/22/23 c/o worsening fatigue over the past 3-4+ months, worsening orthopnea, PND, LE swelling.  Was evaluated by GI on 08/20/2023 - concern for upper GI bleeding. Baseline hgb 10. Hgb 8 during that evaluation, recs for EGD pending cardiology evaulation in setting of HFpEF etc. Pt states that stools are chronically dark, more dark over the past several weeks.   01/09: admitted to hospitalist service w/ acute on chronic HFpEF, upper GIB, ABLA (Hgb 6.9), AKI on CKD (Cr 2.5, baseline around 1.8). Also of note - moderate R and large L pleural effusion on CT imaging 08/19/2022 in setting of acute on chronic HFpEF. Per GI - holding any endoscopic eval/tx for now pending cardio eval / improvement in fluids status. Per cardiology - diurese, no diagnostics. IR consult for thoracentesis 01/10: AKI no improvement (Cr 2.63), Hgb improved to 7.5. Echo EF 60-65% and normal diastolic parameters. Per GI - reassess pending improvement in cardiopulm status. Per cardiology - continue diuresis. Total UOP approx 3L. R thoracentesis yield 1L fluid, more was present but only 1L removed.  01/11: Cr going up, holding diuresis.     Consultants:  Gastroenterology Cardiology  IR  Procedures/Surgeries: 08/23/23 R thoracentesis       ASSESSMENT & PLAN:   Upper GI bleeding ABLA PRBC transfusion 01/09 IV PPI bid H/H bid  GI following - holding any endoscopic eval/tx for now but anticipate will need scope prior  to discharge from hospital Hold anticoagulation, antiplatelet, NSAID Trend CBC/HH  Transfuse for hgb <7  page GI with any acute hemodynamic changes, or signs of active GI bleeding   Acute kidney injury superimposed on chronic kidney disease stage 3a ? Component of cardiorenal syndrome but not really improving w/ diuresis   Initially administered diuresis and PRBC transfusion to improve renal perfusion, AKI persisting/worsening Holding lasix  for now  Monitor renal function    Acute on chronic heart failure with preserved ejection fraction (HFpEF)  2D ECHO 12/2022 w/ EF 60-65%  Echo this admission also EF 60-65% and normal diastolic  Noted symptomatic anemia w/ hgb 6.9 likely confounding issue  diuresis held today d/t AKI Cardiology consult - no invasive diagnostics or stress test at this time  Strict Is and Os and daily weights   Statin Beta blocker  holding ASA d/t GIB holding ACE/ARB d/t AKI Consider jardiance  if renal function improves   CAD (coronary artery disease), native coronary artery hold antiplatelet regimen in setting of concern for upper GI bleeding  Other cardiac meds as above     Pleural effusion moderate on R large on L  S/p R thoracentesis 01/10 May need to repeat d/t residual fluid, see procedure note     Paroxysmal atrial fibrillation  Rate controlled  Hold eliquis  in setting of concern for upper GIB  Labetalol  resumed per cardiology, slightly low HR will add holding parameters for bradycardia    Hypokalemia Replace as needed Monitor BMP  Hyperlipidemia associated with CAD, DM2 statin    OSA (  obstructive sleep apnea) CPAP qhs    Essential hypertension Monitor w/ diuresis           overweight based on BMI: Body mass index is 28.8 kg/m.  Underweight - under 18  overweight - 25 to 29 obese - 30 or more Class 1 obesity: BMI of 30.0 to 34 Class 2 obesity: BMI of 35.0 to 39 Class 3 obesity: BMI of 40.0 to 49 Super Morbid Obesity: BMI  50-59 Super-super Morbid Obesity: BMI 60+ Significantly low or high BMI is associated with higher medical risk.  Weight management advised as adjunct to other disease management and risk reduction treatments    DVT prophylaxis: SCD IV fluids: no continuous IV fluids while diuresing Nutrition: CLD pending possible scope - if GI ok for solids please order or inform attending Central lines / invasive devices: none  Code Status: FULL CODE ACP documentation reviewed:  none on file in [facility]  TOC needs: TBD Barriers to dispo / significant pending items: workup and treatment of acute illness(es) as above             Subjective / Brief ROS:  Patient reports still some SOB but better  Edema is improved  Denies CP/SOB.  Pain controlled.  Denies new weakness.  Tolerating diet.  Reports no concerns w/ urination/defecation.   Family Communication: family at bedside on rounds     Objective Findings:  Vitals:   08/24/23 0358 08/24/23 0500 08/24/23 0820 08/24/23 1145  BP: 139/62  (!) 167/80 119/69  Pulse: (!) 58  (!) 54 (!) 55  Resp: 20  18 18   Temp: 97.9 F (36.6 C)  (!) 97.4 F (36.3 C) 97.8 F (36.6 C)  TempSrc:      SpO2: 100%  97% 93%  Weight:  92.3 kg    Height:        Intake/Output Summary (Last 24 hours) at 08/24/2023 1432 Last data filed at 08/24/2023 1000 Gross per 24 hour  Intake 956 ml  Output 1975 ml  Net -1019 ml   Filed Weights   08/22/23 0727 08/23/23 2040 08/24/23 0500  Weight: 99 kg 92.7 kg 92.3 kg    Examination:  Physical Exam Cardiovascular:     Rate and Rhythm: Normal rate.     Heart sounds: Murmur heard.  Pulmonary:     Effort: Pulmonary effort is normal.     Breath sounds: Examination of the right-lower field reveals rales. Examination of the left-lower field reveals rales. Rales (improved) present.  Musculoskeletal:     Right lower leg: Edema present.     Left lower leg: Edema present.  Skin:    General: Skin is warm and dry.   Neurological:     General: No focal deficit present.     Mental Status: He is alert and oriented to person, place, and time.  Psychiatric:        Mood and Affect: Mood normal.        Behavior: Behavior normal.          Scheduled Medications:   labetalol   100 mg Oral BID   pantoprazole  (PROTONIX ) IV  40 mg Intravenous Q12H   rosuvastatin   40 mg Oral Daily   sodium chloride  flush  3 mL Intravenous Q12H    Continuous Infusions:    PRN Medications:  hydrALAZINE , ondansetron  **OR** ondansetron  (ZOFRAN ) IV, sodium chloride  flush  Antimicrobials from admission:  Anti-infectives (From admission, onward)    None  Data Reviewed:  I have personally reviewed the following...  CBC: Recent Labs  Lab 08/22/23 0730 08/22/23 1300 08/22/23 2030 08/23/23 0452 08/23/23 2052 08/24/23 0415  WBC 4.2  --   --   --   --  3.9*  HGB 6.9* 7.2* 7.7* 7.5* 8.1* 7.7*  HCT 22.2* 22.5* 24.2* 23.6* 25.6* 24.3*  MCV 105.2*  --   --   --   --  104.7*  PLT 103*  --   --   --   --  95*   Basic Metabolic Panel: Recent Labs  Lab 08/22/23 0730 08/23/23 0452 08/24/23 0415  NA 136 141 141  K 4.5 3.9 3.4*  CL 104 107 105  CO2 20* 21* 25  GLUCOSE 140* 114* 120*  BUN 41* 43* 47*  CREATININE 2.48* 2.63* 2.97*  CALCIUM  10.3 10.5* 10.3   GFR: Estimated Creatinine Clearance: 24.3 mL/min (A) (by C-G formula based on SCr of 2.97 mg/dL (H)). Liver Function Tests: Recent Labs  Lab 08/23/23 0452  AST 18  ALT 17  ALKPHOS 85  BILITOT 1.5*  PROT 6.9  ALBUMIN 4.1   No results for input(s): LIPASE, AMYLASE in the last 168 hours. No results for input(s): AMMONIA in the last 168 hours. Coagulation Profile: No results for input(s): INR, PROTIME in the last 168 hours. Cardiac Enzymes: No results for input(s): CKTOTAL, CKMB, CKMBINDEX, TROPONINI in the last 168 hours. BNP (last 3 results) No results for input(s): PROBNP in the last 8760 hours. HbA1C: No  results for input(s): HGBA1C in the last 72 hours. CBG: No results for input(s): GLUCAP in the last 168 hours. Lipid Profile: No results for input(s): CHOL, HDL, LDLCALC, TRIG, CHOLHDL, LDLDIRECT in the last 72 hours. Thyroid  Function Tests: No results for input(s): TSH, T4TOTAL, FREET4, T3FREE, THYROIDAB in the last 72 hours. Anemia Panel: Recent Labs    08/22/23 1300  VITAMINB12 497  FOLATE 8.9  FERRITIN 125  TIBC 371  IRON  51  RETICCTPCT 3.8*   Most Recent Urinalysis On File:     Component Value Date/Time   COLORURINE YELLOW (A) 12/20/2022 1258   APPEARANCEUR HAZY (A) 12/20/2022 1258   LABSPEC 1.013 12/20/2022 1258   PHURINE 7.0 12/20/2022 1258   GLUCOSEU NEGATIVE 12/20/2022 1258   HGBUR NEGATIVE 12/20/2022 1258   BILIRUBINUR NEGATIVE 12/20/2022 1258   KETONESUR NEGATIVE 12/20/2022 1258   PROTEINUR >=300 (A) 12/20/2022 1258   NITRITE NEGATIVE 12/20/2022 1258   LEUKOCYTESUR NEGATIVE 12/20/2022 1258   Sepsis Labs: @LABRCNTIP (procalcitonin:4,lacticidven:4) Microbiology: Recent Results (from the past 240 hours)  Body fluid culture w Gram Stain     Status: None (Preliminary result)   Collection Time: 08/23/23 12:10 PM   Specimen: A: PATH Cytology Pleural fluid   B: PATH Cytology Pleural fluid  Result Value Ref Range Status   Specimen Description   Final    PLEURAL Performed at [facility], [address], [place], [place] [address]    Special Requests   Final    PLEURAL Performed at [facility], [address] [place], [place] [address]    Gram Stain   Final    WBC PRESENT,BOTH PMN AND MONONUCLEAR NO ORGANISMS SEEN CYTOSPIN SMEAR    Culture   Final    NO GROWTH < 24 HOURS Performed at [facility], [address], [place], [place] [address]    Report Status PENDING  Incomplete      Radiology Studies last 3 days: US  LIVER DOPPLER Result Date: 08/24/2023  CLINICAL DATA:  Cirrhosis. EXAM: DUPLEX  ULTRASOUND OF LIVER TECHNIQUE: Color and duplex Doppler ultrasound was performed to evaluate the hepatic in-flow and out-flow vessels. COMPARISON:  CT chest abdomen pelvis 08/20/2023 and abdominal ultrasound dated 08/06/2023 FINDINGS: Liver: Liver contour is mildly nodular. Liver parenchyma is mildly heterogeneous. No discrete liver lesion. Again noted are small echogenic foci along the gallbladder wall. These may represent small polyps and better characterized on the previous exam from 08/06/2023. Main Portal Vein size: 2.4 cm Portal Vein Velocities Main Prox:  39 cm/sec Main Mid: 37 cm/sec Main Dist:  36 cm/sec Right: 44 cm/sec Left: 43 cm/sec Hepatic Vein Velocities Right:  32 cm/sec Middle:  39 cm/sec Left:  33 cm/sec IVC: Present and patent with normal respiratory phasicity. Hepatic Artery Velocity:  41 cm/sec Splenic Vein Velocity:  32 cm/sec Spleen: 13.3 cm x 5.5 cm x 12.8 cm with a total volume of 487 cm^3 (411 cm^3 is upper limit normal) Portal Vein Occlusion/Thrombus: No Splenic Vein Occlusion/Thrombus: No Ascites: Present Varices: None Bilateral pleural effusions. Normal hepatopetal flow in the portal veins. Normal hepatofugal flow in the hepatic veins. Small amount of ascites around the spleen. IMPRESSION: 1. Patent portal vein with normal direction of flow. 2. Liver contour is mildly nodular and concerning for cirrhosis. 3. Mild splenomegaly. 4. Bilateral pleural effusions.  Small amount of ascites. Electronically Signed   By: Juliene Balder M.D.   On: 08/24/2023 10:19   US  THORACENTESIS ASP PLEURAL SPACE W/IMG GUIDE Result Date: 08/23/2023 INDICATION: Patient is a 78 y/o male with history of CAD- s/p CABG, pulmonary HTN, HTN, OSA, type 2 diabetes mellitus, hypothyroidism, heart failure, anemia, and kidney disease. Patient presents for a diagnostic and therapeutic thoracentesis due to bilateral pleural effusions. EXAM: ULTRASOUND GUIDED right sided THORACENTESIS MEDICATIONS: 10mL lidocaine  1%  COMPLICATIONS: None immediate. PROCEDURE: An ultrasound guided thoracentesis was thoroughly discussed with the patient and questions answered. The benefits, risks, alternatives and complications were also discussed. The patient understands and wishes to proceed with the procedure. Written consent was obtained. Ultrasound was performed to localize and mark an adequate pocket of fluid in the right chest. The area was then prepped and draped in the normal sterile fashion. 1% Lidocaine  was used for local anesthesia. Under ultrasound guidance a 6 Fr Safe-T-Centesis catheter was introduced. Thoracentesis was performed. The catheter was removed and a dressing applied. FINDINGS: A total of approximately 1L of amber colored fluid was removed. Samples were sent to the laboratory as requested by the clinical team. IMPRESSION: Successful ultrasound guided right thoracentesis yielding 1L of pleural fluid. Performed by Daved Hipp PA-C Electronically Signed   By: Wilkie Lent M.D.   On: 08/23/2023 13:32   DG Chest Port 1 View Result Date: 08/23/2023 CLINICAL DATA:  Pleural effusion.  Status post right thoracentesis. EXAM: PORTABLE CHEST 1 VIEW COMPARISON:  Chest radiographs 08/22/2023 FINDINGS: The cardiac silhouette remains enlarged. Aortic atherosclerosis and previous CABG are again noted. Mild pulmonary vascular congestion is similar to the prior study. There are persistent small bilateral pleural effusions with bibasilar atelectasis. No pneumothorax is identified. IMPRESSION: Persistent small bilateral pleural effusions and bibasilar atelectasis. No pneumothorax. Electronically Signed   By: Dasie Hamburg M.D.   On: 08/23/2023 13:00   ECHOCARDIOGRAM COMPLETE Result Date: 08/23/2023    ECHOCARDIOGRAM REPORT   Patient Name:   ALASTOR KNEALE Date of Exam: 08/23/2023 Medical Rec #:  [id number]        Height:       73.0 in Accession #:  [id number]       Weight:       218.3 lb Date of Birth:  1946/07/08         BSA:           2.233 m Patient Age:    77 years         BP:           165/73 mmHg Patient Gender: M                HR:           68 bpm. Exam Location:  [facility] Procedure: 2D Echo, Cardiac Doppler and Color Doppler Indications:     CHF-acute systolic I50.21  History:         Patient has prior history of Echocardiogram examinations, most                  recent 12/23/2022. Arrythmias:LBBB; Signs/Symptoms:Dyspnea.  Sonographer:     Christopher Furnace Referring Phys:  [id number] PARAS NEWTON Diagnosing Phys: Marsa Dooms MD IMPRESSIONS  1. Left ventricular ejection fraction, by estimation, is 60 to 65%. The left ventricle has normal function. The left ventricle has no regional wall motion abnormalities. Left ventricular diastolic parameters were normal.  2. Right ventricular systolic function is normal. The right ventricular size is normal.  3. Left atrial size was mildly dilated.  4. Right atrial size was mildly dilated.  5. The mitral valve is normal in structure. Mild to moderate mitral valve regurgitation. No evidence of mitral stenosis.  6. The aortic valve is normal in structure. Aortic valve regurgitation is mild to moderate. Mild to moderate aortic valve stenosis.  7. The inferior vena cava is normal in size with greater than 50% respiratory variability, suggesting right atrial pressure of 3 mmHg. FINDINGS  Left Ventricle: Left ventricular ejection fraction, by estimation, is 60 to 65%. The left ventricle has normal function. The left ventricle has no regional wall motion abnormalities. The left ventricular internal cavity size was normal in size. There is  no left ventricular hypertrophy. Left ventricular diastolic parameters were normal. Right Ventricle: The right ventricular size is normal. No increase in right ventricular wall thickness. Right ventricular systolic function is normal. Left Atrium: Left atrial size was mildly dilated. Right Atrium: Right atrial size was mildly dilated. Pericardium: Trivial pericardial  effusion is present. Mitral Valve: The mitral valve is normal in structure. Mild to moderate mitral valve regurgitation. No evidence of mitral valve stenosis. Tricuspid Valve: The tricuspid valve is normal in structure. Tricuspid valve regurgitation is not demonstrated. No evidence of tricuspid stenosis. Aortic Valve: The aortic valve is normal in structure. Aortic valve regurgitation is mild to moderate. Mild to moderate aortic stenosis is present. Aortic valve mean gradient measures 12.7 mmHg. Aortic valve peak gradient measures 22.0 mmHg. Aortic valve  area, by VTI measures 1.57 cm. Pulmonic Valve: The pulmonic valve was normal in structure. Pulmonic valve regurgitation is not visualized. No evidence of pulmonic stenosis. Aorta: The aortic root is normal in size and structure. Venous: The inferior vena cava is normal in size with greater than 50% respiratory variability, suggesting right atrial pressure of 3 mmHg. IAS/Shunts: No atrial level shunt detected by color flow Doppler.  LEFT VENTRICLE PLAX 2D LVIDd:         5.10 cm   Diastology LVIDs:         3.10 cm   LV e' medial:    6.09 cm/s LV PW:  0.80 cm   LV E/e' medial:  21.7 LV IVS:        1.40 cm   LV e' lateral:   13.80 cm/s LVOT diam:     2.00 cm   LV E/e' lateral: 9.6 LV SV:         73 LV SV Index:   33 LVOT Area:     3.14 cm  RIGHT VENTRICLE RV Basal diam:  4.60 cm RV Mid diam:    4.00 cm LEFT ATRIUM             Index        RIGHT ATRIUM           Index LA diam:        4.80 cm 2.15 cm/m   RA Area:     21.80 cm LA Vol (A2C):   71.1 ml 31.84 ml/m  RA Volume:   64.80 ml  29.02 ml/m LA Vol (A4C):   68.6 ml 30.72 ml/m LA Biplane Vol: 74.2 ml 33.23 ml/m  AORTIC VALVE AV Area (Vmax):    1.33 cm AV Area (Vmean):   1.38 cm AV Area (VTI):     1.57 cm AV Vmax:           234.67 cm/s AV Vmean:          163.667 cm/s AV VTI:            0.466 m AV Peak Grad:      22.0 mmHg AV Mean Grad:      12.7 mmHg LVOT Vmax:         99.00 cm/s LVOT Vmean:         71.700 cm/s LVOT VTI:          0.232 m LVOT/AV VTI ratio: 0.50  AORTA Ao Root diam: 3.40 cm MITRAL VALVE                TRICUSPID VALVE MV Area (PHT): 4.24 cm     TR Peak grad:   40.4 mmHg MV Decel Time: 179 msec     TR Vmax:        318.00 cm/s MV E velocity: 132.00 cm/s MV A velocity: 50.30 cm/s   SHUNTS MV E/A ratio:  2.62         Systemic VTI:  0.23 m                             Systemic Diam: 2.00 cm Marsa Dooms MD Electronically signed by Marsa Dooms MD Signature Date/Time: 08/23/2023/11:45:16 AM    Final    DG Chest 2 View Result Date: 08/22/2023 CLINICAL DATA:  78 year old male with shortness of breath, fatigue. EXAM: CHEST - 2 VIEW COMPARISON:  CT Chest, Abdomen, and Pelvis 08/20/2023 and earlier. FINDINGS: PA and lateral views 0743 hours today. Stable cardiomegaly and mediastinal contours. Prior CABG. Ongoing bilateral pleural effusions, moderate and slightly larger on the right. No pneumothorax. Bilateral pulmonary vascular congestion is relatively mild, mild interstitial edema is possible. No air bronchograms. Overall ventilation not significantly changed from recent CT. No acute osseous abnormality identified. Negative visible bowel gas. IMPRESSION: Bilateral pleural effusions and interstitial edema. Ventilation not significantly changed from CT two days ago. Electronically Signed   By: VEAR Hurst M.D.   On: 08/22/2023 07:59      [facility] 08/24/2023, 2:32 PM    Dictation software may have been used to generate the above note.  Typos may occur and escape review in typed/dictated notes. Please contact Dr Marsa directly for clarity if needed.  Staff may message me via secure chat in Epic  but this may not receive an immediate response,  please page me for urgent matters!  If 7PM-7AM, please contact night coverage [email or web address]

## 2023-08-24 NOTE — Progress Notes (Signed)
 Patient ID: [name], male   DOB: Dec 02, 1945, 78 y.o.   MRN: [id number] [facility]    SUBJECTIVE: Persistent shortness of breath dyspnea slightly improved persistent lower extremity edema denies any chest pain feeling somewhat better.   Vitals:   08/24/23 0358 08/24/23 0500 08/24/23 0820 08/24/23 1145  BP: 139/62  (!) 167/80 119/69  Pulse: (!) 58  (!) 54 (!) 55  Resp: 20  18 18   Temp: 97.9 F (36.6 C)  (!) 97.4 F (36.3 C) 97.8 F (36.6 C)  TempSrc:      SpO2: 100%  97% 93%  Weight:  92.3 kg    Height:         Intake/Output Summary (Last 24 hours) at 08/24/2023 1446 Last data filed at 08/24/2023 1000 Gross per 24 hour  Intake 956 ml  Output 1975 ml  Net -1019 ml      PHYSICAL EXAM  General: Well developed, well nourished, in no acute distress HEENT:  Normocephalic and atramatic Neck:  No JVD.  Lungs: Clear bilaterally to auscultation and percussion. Heart: Irregular irregular bradycardic. Normal S1 and S2 without gallops or  3/6 sem murmurs.  Abdomen: Bowel sounds are positive, abdomen soft and non-tender  Msk:  Back normal, normal gait. Normal strength and tone for age. Extremities: No clubbing, cyanosis or 3+edema.   Neuro: Alert and oriented X 3. Psych:  Good affect, responds appropriately   LABS: Basic Metabolic Panel: Recent Labs    08/23/23 0452 08/24/23 0415  NA 141 141  K 3.9 3.4*  CL 107 105  CO2 21* 25  GLUCOSE 114* 120*  BUN 43* 47*  CREATININE 2.63* 2.97*  CALCIUM  10.5* 10.3   Liver Function Tests: Recent Labs    08/23/23 0452  AST 18  ALT 17  ALKPHOS 85  BILITOT 1.5*  PROT 6.9  ALBUMIN 4.1   No results for input(s): LIPASE, AMYLASE in the last 72 hours. CBC: Recent Labs    08/22/23 0730 08/22/23 1300 08/23/23 2052 08/24/23 0415  WBC 4.2  --   --  3.9*  HGB 6.9*   < > 8.1* 7.7*  HCT 22.2*   < > 25.6* 24.3*  MCV 105.2*  --   --  104.7*  PLT 103*  --   --  95*   < > = values in this interval not displayed.    Cardiac Enzymes: No results for input(s): CKTOTAL, CKMB, CKMBINDEX, TROPONINI in the last 72 hours. BNP: Invalid input(s): POCBNP D-Dimer: No results for input(s): DDIMER in the last 72 hours. Hemoglobin A1C: No results for input(s): HGBA1C in the last 72 hours. Fasting Lipid Panel: No results for input(s): CHOL, HDL, LDLCALC, TRIG, CHOLHDL, LDLDIRECT in the last 72 hours. Thyroid  Function Tests: No results for input(s): TSH, T4TOTAL, T3FREE, THYROIDAB in the last 72 hours.  Invalid input(s): FREET3 Anemia Panel: Recent Labs    08/22/23 1300  VITAMINB12 497  FOLATE 8.9  FERRITIN 125  TIBC 371  IRON  51  RETICCTPCT 3.8*    US  LIVER DOPPLER Result Date: 08/24/2023 CLINICAL DATA:  Cirrhosis. EXAM: DUPLEX ULTRASOUND OF LIVER TECHNIQUE: Color and duplex Doppler ultrasound was performed to evaluate the hepatic in-flow and out-flow vessels. COMPARISON:  CT chest abdomen pelvis 08/20/2023 and abdominal ultrasound dated 08/06/2023 FINDINGS: Liver: Liver contour is mildly nodular. Liver parenchyma is mildly heterogeneous. No discrete liver lesion. Again noted are small echogenic foci along the gallbladder wall. These may represent small polyps and better characterized on the previous exam from  08/06/2023. Main Portal Vein size: 2.4 cm Portal Vein Velocities Main Prox:  39 cm/sec Main Mid: 37 cm/sec Main Dist:  36 cm/sec Right: 44 cm/sec Left: 43 cm/sec Hepatic Vein Velocities Right:  32 cm/sec Middle:  39 cm/sec Left:  33 cm/sec IVC: Present and patent with normal respiratory phasicity. Hepatic Artery Velocity:  41 cm/sec Splenic Vein Velocity:  32 cm/sec Spleen: 13.3 cm x 5.5 cm x 12.8 cm with a total volume of 487 cm^3 (411 cm^3 is upper limit normal) Portal Vein Occlusion/Thrombus: No Splenic Vein Occlusion/Thrombus: No Ascites: Present Varices: None Bilateral pleural effusions. Normal hepatopetal flow in the portal veins. Normal hepatofugal flow in the  hepatic veins. Small amount of ascites around the spleen. IMPRESSION: 1. Patent portal vein with normal direction of flow. 2. Liver contour is mildly nodular and concerning for cirrhosis. 3. Mild splenomegaly. 4. Bilateral pleural effusions.  Small amount of ascites. Electronically Signed   By: Juliene Balder M.D.   On: 08/24/2023 10:19   US  THORACENTESIS ASP PLEURAL SPACE W/IMG GUIDE Result Date: 08/23/2023 INDICATION: Patient is a 78 y/o male with history of CAD- s/p CABG, pulmonary HTN, HTN, OSA, type 2 diabetes mellitus, hypothyroidism, heart failure, anemia, and kidney disease. Patient presents for a diagnostic and therapeutic thoracentesis due to bilateral pleural effusions. EXAM: ULTRASOUND GUIDED right sided THORACENTESIS MEDICATIONS: 10mL lidocaine  1% COMPLICATIONS: None immediate. PROCEDURE: An ultrasound guided thoracentesis was thoroughly discussed with the patient and questions answered. The benefits, risks, alternatives and complications were also discussed. The patient understands and wishes to proceed with the procedure. Written consent was obtained. Ultrasound was performed to localize and mark an adequate pocket of fluid in the right chest. The area was then prepped and draped in the normal sterile fashion. 1% Lidocaine  was used for local anesthesia. Under ultrasound guidance a 6 Fr Safe-T-Centesis catheter was introduced. Thoracentesis was performed. The catheter was removed and a dressing applied. FINDINGS: A total of approximately 1L of amber colored fluid was removed. Samples were sent to the laboratory as requested by the clinical team. IMPRESSION: Successful ultrasound guided right thoracentesis yielding 1L of pleural fluid. Performed by Daved Hipp PA-C Electronically Signed   By: Wilkie Lent M.D.   On: 08/23/2023 13:32   DG Chest Port 1 View Result Date: 08/23/2023 CLINICAL DATA:  Pleural effusion.  Status post right thoracentesis. EXAM: PORTABLE CHEST 1 VIEW COMPARISON:   Chest radiographs 08/22/2023 FINDINGS: The cardiac silhouette remains enlarged. Aortic atherosclerosis and previous CABG are again noted. Mild pulmonary vascular congestion is similar to the prior study. There are persistent small bilateral pleural effusions with bibasilar atelectasis. No pneumothorax is identified. IMPRESSION: Persistent small bilateral pleural effusions and bibasilar atelectasis. No pneumothorax. Electronically Signed   By: Dasie Hamburg M.D.   On: 08/23/2023 13:00   ECHOCARDIOGRAM COMPLETE Result Date: 08/23/2023    ECHOCARDIOGRAM REPORT   Patient Name:   [name] Date of Exam: 08/23/2023 Medical Rec #:  [id number]        Height:       73.0 in Accession #:    [id number]       Weight:       218.3 lb Date of Birth:  01/16/1946         BSA:          2.233 m Patient Age:    77 years         BP:           165/73 mmHg Patient Gender: M  HR:           68 bpm. Exam Location:  [facility] Procedure: 2D Echo, Cardiac Doppler and Color Doppler Indications:     CHF-acute systolic I50.21  History:         Patient has prior history of Echocardiogram examinations, most                  recent 12/23/2022. Arrythmias:LBBB; Signs/Symptoms:Dyspnea.  Sonographer:     Christopher Furnace Referring Phys:  [id number] ELSPETH PARAS NEWTON Diagnosing Phys: Marsa Dooms MD IMPRESSIONS  1. Left ventricular ejection fraction, by estimation, is 60 to 65%. The left ventricle has normal function. The left ventricle has no regional wall motion abnormalities. Left ventricular diastolic parameters were normal.  2. Right ventricular systolic function is normal. The right ventricular size is normal.  3. Left atrial size was mildly dilated.  4. Right atrial size was mildly dilated.  5. The mitral valve is normal in structure. Mild to moderate mitral valve regurgitation. No evidence of mitral stenosis.  6. The aortic valve is normal in structure. Aortic valve regurgitation is mild to moderate. Mild to moderate aortic valve stenosis.   7. The inferior vena cava is normal in size with greater than 50% respiratory variability, suggesting right atrial pressure of 3 mmHg. FINDINGS  Left Ventricle: Left ventricular ejection fraction, by estimation, is 60 to 65%. The left ventricle has normal function. The left ventricle has no regional wall motion abnormalities. The left ventricular internal cavity size was normal in size. There is  no left ventricular hypertrophy. Left ventricular diastolic parameters were normal. Right Ventricle: The right ventricular size is normal. No increase in right ventricular wall thickness. Right ventricular systolic function is normal. Left Atrium: Left atrial size was mildly dilated. Right Atrium: Right atrial size was mildly dilated. Pericardium: Trivial pericardial effusion is present. Mitral Valve: The mitral valve is normal in structure. Mild to moderate mitral valve regurgitation. No evidence of mitral valve stenosis. Tricuspid Valve: The tricuspid valve is normal in structure. Tricuspid valve regurgitation is not demonstrated. No evidence of tricuspid stenosis. Aortic Valve: The aortic valve is normal in structure. Aortic valve regurgitation is mild to moderate. Mild to moderate aortic stenosis is present. Aortic valve mean gradient measures 12.7 mmHg. Aortic valve peak gradient measures 22.0 mmHg. Aortic valve  area, by VTI measures 1.57 cm. Pulmonic Valve: The pulmonic valve was normal in structure. Pulmonic valve regurgitation is not visualized. No evidence of pulmonic stenosis. Aorta: The aortic root is normal in size and structure. Venous: The inferior vena cava is normal in size with greater than 50% respiratory variability, suggesting right atrial pressure of 3 mmHg. IAS/Shunts: No atrial level shunt detected by color flow Doppler.  LEFT VENTRICLE PLAX 2D LVIDd:         5.10 cm   Diastology LVIDs:         3.10 cm   LV e' medial:    6.09 cm/s LV PW:         0.80 cm   LV E/e' medial:  21.7 LV IVS:        1.40 cm    LV e' lateral:   13.80 cm/s LVOT diam:     2.00 cm   LV E/e' lateral: 9.6 LV SV:         73 LV SV Index:   33 LVOT Area:     3.14 cm  RIGHT VENTRICLE RV Basal diam:  4.60 cm RV Mid diam:  4.00 cm LEFT ATRIUM             Index        RIGHT ATRIUM           Index LA diam:        4.80 cm 2.15 cm/m   RA Area:     21.80 cm LA Vol (A2C):   71.1 ml 31.84 ml/m  RA Volume:   64.80 ml  29.02 ml/m LA Vol (A4C):   68.6 ml 30.72 ml/m LA Biplane Vol: 74.2 ml 33.23 ml/m  AORTIC VALVE AV Area (Vmax):    1.33 cm AV Area (Vmean):   1.38 cm AV Area (VTI):     1.57 cm AV Vmax:           234.67 cm/s AV Vmean:          163.667 cm/s AV VTI:            0.466 m AV Peak Grad:      22.0 mmHg AV Mean Grad:      12.7 mmHg LVOT Vmax:         99.00 cm/s LVOT Vmean:        71.700 cm/s LVOT VTI:          0.232 m LVOT/AV VTI ratio: 0.50  AORTA Ao Root diam: 3.40 cm MITRAL VALVE                TRICUSPID VALVE MV Area (PHT): 4.24 cm     TR Peak grad:   40.4 mmHg MV Decel Time: 179 msec     TR Vmax:        318.00 cm/s MV E velocity: 132.00 cm/s MV A velocity: 50.30 cm/s   SHUNTS MV E/A ratio:  2.62         Systemic VTI:  0.23 m                             Systemic Diam: 2.00 cm Marsa Dooms MD Electronically signed by Marsa Dooms MD Signature Date/Time: 08/23/2023/11:45:16 AM    Final      Echo of left ventricular function EF of at least 60%  TELEMETRY: Fibrillation rate of 55 nonspecific findings:  ASSESSMENT AND PLAN:  Principal Problem:   CHF exacerbation (HCC) Active Problems:   Essential hypertension   OSA (obstructive sleep apnea)   CAD (coronary artery disease), native coronary artery   Hyperlipidemia associated with type 2 diabetes mellitus (HCC)   Acute blood loss anemia   Acute heart failure with preserved ejection fraction (HFpEF) (HCC)   Paroxysmal atrial fibrillation (HCC)   Acute kidney injury superimposed on chronic kidney disease (HCC)   Upper GI bleeding   Pleural effusion    Symptomatic anemia    Plan Acute diastolic congestive heart failure continue diuretic therapy heart failure management Multivessel coronary disease no recent angina continue conservative therapy Agree with physical therapy to help with strength training Support stockings elevation for lower extremity edema Anemia follow-up with GI per Dr. Jinny Continue diabetes management and control Echocardiogram with moderate severe mitral regurgitation moderate tricuspid regurgitation moderate AI mild valvular disease may be contributing to this recent heart failure symptoms Obstructive sleep apnea recommend sleep study CPAP weight loss Paroxysmal atrial fibrillation continue anticoagulation rate control   Cara JONETTA Lovelace, MD 08/24/2023 2:46 PM

## 2023-08-24 NOTE — Plan of Care (Signed)
  Problem: Education: Goal: Knowledge of General Education information will improve Description: Including pain rating scale, medication(s)/side effects and non-pharmacologic comfort measures 08/24/2023 1558 by Jenita Fleeting, RN Outcome: Progressing 08/24/2023 1557 by Jenita Fleeting, RN Outcome: Progressing   Problem: Health Behavior/Discharge Planning: Goal: Ability to manage health-related needs will improve 08/24/2023 1558 by Jenita Fleeting, RN Outcome: Progressing 08/24/2023 1557 by Jenita Fleeting, RN Outcome: Progressing   Problem: Clinical Measurements: Goal: Ability to maintain clinical measurements within normal limits will improve 08/24/2023 1558 by Jenita Fleeting, RN Outcome: Progressing 08/24/2023 1557 by Jenita Fleeting, RN Outcome: Progressing Goal: Will remain free from infection 08/24/2023 1558 by Jenita Fleeting, RN Outcome: Progressing 08/24/2023 1557 by Jenita Fleeting, RN Outcome: Progressing Goal: Diagnostic test results will improve 08/24/2023 1558 by Jenita Fleeting, RN Outcome: Progressing 08/24/2023 1557 by Jenita Fleeting, RN Outcome: Progressing Goal: Respiratory complications will improve 08/24/2023 1558 by Jenita Fleeting, RN Outcome: Progressing 08/24/2023 1557 by Jenita Fleeting, RN Outcome: Progressing Goal: Cardiovascular complication will be avoided 08/24/2023 1558 by Jenita Fleeting, RN Outcome: Progressing 08/24/2023 1557 by Jenita Fleeting, RN Outcome: Progressing   Problem: Clinical Measurements: Goal: Will remain free from infection 08/24/2023 1558 by Jenita Fleeting, RN Outcome: Progressing 08/24/2023 1557 by Jenita Fleeting, RN Outcome: Progressing

## 2023-08-24 NOTE — Plan of Care (Signed)
   Problem: Education: Goal: Knowledge of General Education information will improve Description Including pain rating scale, medication(s)/side effects and non-pharmacologic comfort measures Outcome: Progressing   Problem: Health Behavior/Discharge Planning: Goal: Ability to manage health-related needs will improve Outcome: Progressing

## 2023-08-25 DIAGNOSIS — R188 Other ascites: Secondary | ICD-10-CM

## 2023-08-25 DIAGNOSIS — J948 Other specified pleural conditions: Secondary | ICD-10-CM

## 2023-08-25 DIAGNOSIS — K7460 Unspecified cirrhosis of liver: Secondary | ICD-10-CM | POA: Diagnosis not present

## 2023-08-25 DIAGNOSIS — J90 Pleural effusion, not elsewhere classified: Secondary | ICD-10-CM | POA: Diagnosis not present

## 2023-08-25 DIAGNOSIS — K921 Melena: Secondary | ICD-10-CM

## 2023-08-25 DIAGNOSIS — I480 Paroxysmal atrial fibrillation: Secondary | ICD-10-CM

## 2023-08-25 DIAGNOSIS — Z87891 Personal history of nicotine dependence: Secondary | ICD-10-CM

## 2023-08-25 DIAGNOSIS — D62 Acute posthemorrhagic anemia: Secondary | ICD-10-CM | POA: Diagnosis not present

## 2023-08-25 DIAGNOSIS — E8770 Fluid overload, unspecified: Secondary | ICD-10-CM

## 2023-08-25 DIAGNOSIS — D649 Anemia, unspecified: Secondary | ICD-10-CM | POA: Diagnosis not present

## 2023-08-25 LAB — CBC
HCT: 22.8 % — ABNORMAL LOW (ref 39.0–52.0)
Hemoglobin: 7.3 g/dL — ABNORMAL LOW (ref 13.0–17.0)
MCH: 32.9 pg (ref 26.0–34.0)
MCHC: 32 g/dL (ref 30.0–36.0)
MCV: 102.7 fL — ABNORMAL HIGH (ref 80.0–100.0)
Platelets: 91 10*3/uL — ABNORMAL LOW (ref 150–400)
RBC: 2.22 MIL/uL — ABNORMAL LOW (ref 4.22–5.81)
RDW: 17.8 % — ABNORMAL HIGH (ref 11.5–15.5)
WBC: 4.6 10*3/uL (ref 4.0–10.5)
nRBC: 0 % (ref 0.0–0.2)

## 2023-08-25 LAB — BASIC METABOLIC PANEL
Anion gap: 9 (ref 5–15)
BUN: 42 mg/dL — ABNORMAL HIGH (ref 8–23)
CO2: 25 mmol/L (ref 22–32)
Calcium: 9.7 mg/dL (ref 8.9–10.3)
Chloride: 103 mmol/L (ref 98–111)
Creatinine, Ser: 3.01 mg/dL — ABNORMAL HIGH (ref 0.61–1.24)
GFR, Estimated: 21 mL/min — ABNORMAL LOW (ref >60)
Glucose, Bld: 135 mg/dL — ABNORMAL HIGH (ref 70–99)
Potassium: 3.3 mmol/L — ABNORMAL LOW (ref 3.5–5.1)
Sodium: 137 mmol/L (ref 135–145)

## 2023-08-25 LAB — PREPARE RBC (CROSSMATCH)

## 2023-08-25 MED ORDER — SODIUM CHLORIDE 0.9% IV SOLUTION: Freq: Once | INTRAVENOUS | Status: AC

## 2023-08-25 MED ORDER — SODIUM CHLORIDE 0.9 % IV SOLN
INTRAVENOUS | Status: DC
Start: 2023-08-25 — End: 2023-08-26

## 2023-08-25 NOTE — Progress Notes (Signed)
 Corinn JONELLE Brooklyn, MD [address]  [address]  [place], [place] [address]  Main: [phone]  Fax: [phone] Pager: [phone]   Subjective: Patient reports feeling significantly better since thoracentesis.  Not on supplemental oxygen, not in respiratory distress.  Reports ongoing very dark, almost black stools daily Denies any abdominal pain, nausea or vomiting, rectal bleeding   Objective: Vital signs in last 24 hours: Vitals:   08/25/23 0420 08/25/23 0428 08/25/23 0544 08/25/23 0802  BP:  (!) 142/74  132/74  Pulse:  (!) 59  61  Resp:  20  20  Temp:  97.8 F (36.6 C)  97.8 F (36.6 C)  TempSrc:  Oral  Oral  SpO2:  92%  94%  Weight: 92.4 kg  94.1 kg   Height:       Weight change: -0.27 kg  Intake/Output Summary (Last 24 hours) at 08/25/2023 1140 Last data filed at 08/25/2023 0400 Gross per 24 hour  Intake 1200 ml  Output 75 ml  Net 1125 ml    Exam: Heart:: Regular rate and rhythm, S1S2 present, or without murmur or extra heart sounds Lungs: normal and clear to auscultation Abdomen: soft, nontender, normal bowel sounds   Lab Results:    Latest Ref Rng & Units 08/25/2023    5:47 AM 08/24/2023    4:33 PM 08/24/2023    4:15 AM  CBC  WBC 4.0 - 10.5 K/uL 4.6   3.9   Hemoglobin 13.0 - 17.0 g/dL 7.3  7.3  7.7   Hematocrit 39.0 - 52.0 % 22.8  22.7  24.3   Platelets 150 - 400 K/uL 91   95       Latest Ref Rng & Units 08/25/2023    8:14 AM 08/24/2023    4:15 AM 08/23/2023    4:52 AM  CMP  Glucose 70 - 99 mg/dL 864  879  885   BUN 8 - 23 mg/dL 42  47  43   Creatinine 0.61 - 1.24 mg/dL 6.98  7.02  7.36   Sodium 135 - 145 mmol/L 137  141  141   Potassium 3.5 - 5.1 mmol/L 3.3  3.4  3.9   Chloride 98 - 111 mmol/L 103  105  107   CO2 22 - 32 mmol/L 25  25  21    Calcium  8.9 - 10.3 mg/dL 9.7  89.6  89.4   Total Protein 6.5 - 8.1 g/dL   6.9   Total Bilirubin 0.0 - 1.2 mg/dL   1.5   Alkaline Phos 38 - 126 U/L   85   AST 15 - 41 U/L   18   ALT 0 - 44 U/L    17     Micro Results: Recent Results (from the past 240 hours)  Body fluid culture w Gram Stain     Status: None (Preliminary result)   Collection Time: 08/23/23 12:10 PM   Specimen: A: PATH Cytology Pleural fluid   B: PATH Cytology Pleural fluid  Result Value Ref Range Status   Specimen Description   Final    PLEURAL Performed at [facility], [address] [place], [place] [address]    Special Requests   Final    PLEURAL Performed at [facility], [address] [place], [place] [address]    Gram Stain   Final    WBC PRESENT,BOTH PMN AND MONONUCLEAR NO ORGANISMS SEEN CYTOSPIN SMEAR    Culture   Final    NO GROWTH  2 DAYS Performed at [facility], [address], [place], [place] [address]    Report Status PENDING  Incomplete   Studies/Results: US  LIVER DOPPLER Result Date: 08/24/2023 CLINICAL DATA:  Cirrhosis. EXAM: DUPLEX ULTRASOUND OF LIVER TECHNIQUE: Color and duplex Doppler ultrasound was performed to evaluate the hepatic in-flow and out-flow vessels. COMPARISON:  CT chest abdomen pelvis 08/20/2023 and abdominal ultrasound dated 08/06/2023 FINDINGS: Liver: Liver contour is mildly nodular. Liver parenchyma is mildly heterogeneous. No discrete liver lesion. Again noted are small echogenic foci along the gallbladder wall. These may represent small polyps and better characterized on the previous exam from 08/06/2023. Main Portal Vein size: 2.4 cm Portal Vein Velocities Main Prox:  39 cm/sec Main Mid: 37 cm/sec Main Dist:  36 cm/sec Right: 44 cm/sec Left: 43 cm/sec Hepatic Vein Velocities Right:  32 cm/sec Middle:  39 cm/sec Left:  33 cm/sec IVC: Present and patent with normal respiratory phasicity. Hepatic Artery Velocity:  41 cm/sec Splenic Vein Velocity:  32 cm/sec Spleen: 13.3 cm x 5.5 cm x 12.8 cm with a total volume of 487 cm^3 (411 cm^3 is upper limit normal) Portal Vein Occlusion/Thrombus: No Splenic Vein Occlusion/Thrombus: No Ascites: Present  Varices: None Bilateral pleural effusions. Normal hepatopetal flow in the portal veins. Normal hepatofugal flow in the hepatic veins. Small amount of ascites around the spleen. IMPRESSION: 1. Patent portal vein with normal direction of flow. 2. Liver contour is mildly nodular and concerning for cirrhosis. 3. Mild splenomegaly. 4. Bilateral pleural effusions.  Small amount of ascites. Electronically Signed   By: Juliene Balder M.D.   On: 08/24/2023 10:19   US  THORACENTESIS ASP PLEURAL SPACE W/IMG GUIDE Result Date: 08/23/2023 INDICATION: Patient is a 78 y/o male with history of CAD- s/p CABG, pulmonary HTN, HTN, OSA, type 2 diabetes mellitus, hypothyroidism, heart failure, anemia, and kidney disease. Patient presents for a diagnostic and therapeutic thoracentesis due to bilateral pleural effusions. EXAM: ULTRASOUND GUIDED right sided THORACENTESIS MEDICATIONS: 10mL lidocaine  1% COMPLICATIONS: None immediate. PROCEDURE: An ultrasound guided thoracentesis was thoroughly discussed with the patient and questions answered. The benefits, risks, alternatives and complications were also discussed. The patient understands and wishes to proceed with the procedure. Written consent was obtained. Ultrasound was performed to localize and mark an adequate pocket of fluid in the right chest. The area was then prepped and draped in the normal sterile fashion. 1% Lidocaine  was used for local anesthesia. Under ultrasound guidance a 6 Fr Safe-T-Centesis catheter was introduced. Thoracentesis was performed. The catheter was removed and a dressing applied. FINDINGS: A total of approximately 1L of amber colored fluid was removed. Samples were sent to the laboratory as requested by the clinical team. IMPRESSION: Successful ultrasound guided right thoracentesis yielding 1L of pleural fluid. Performed by Daved Hipp PA-C Electronically Signed   By: Wilkie Lent M.D.   On: 08/23/2023 13:32   DG Chest Port 1 View Result Date:  08/23/2023 CLINICAL DATA:  Pleural effusion.  Status post right thoracentesis. EXAM: PORTABLE CHEST 1 VIEW COMPARISON:  Chest radiographs 08/22/2023 FINDINGS: The cardiac silhouette remains enlarged. Aortic atherosclerosis and previous CABG are again noted. Mild pulmonary vascular congestion is similar to the prior study. There are persistent small bilateral pleural effusions with bibasilar atelectasis. No pneumothorax is identified. IMPRESSION: Persistent small bilateral pleural effusions and bibasilar atelectasis. No pneumothorax. Electronically Signed   By: Dasie Hamburg M.D.   On: 08/23/2023 13:00   Medications: I have reviewed the patient's current medications. Prior to Admission:  Medications  Prior to Admission  Medication Sig Dispense Refill Last Dose/Taking   acetaminophen  (TYLENOL ) 500 MG tablet Take 2 tablets (1,000 mg total) by mouth every 6 (six) hours as needed for mild pain.   Taking As Needed   amLODipine  (NORVASC ) 5 MG tablet Take 5 mg by mouth 2 (two) times daily.   08/22/2023   apixaban  (ELIQUIS ) 5 MG TABS tablet Take 1 tablet (5 mg total) by mouth 2 (two) times daily. 60 tablet 0 08/22/2023 Morning   clopidogrel  (PLAVIX ) 75 MG tablet Take 75 mg by mouth daily.   08/22/2023   cyanocobalamin  (VITAMIN B12) 1000 MCG tablet Take 1,000 mcg by mouth daily.   08/22/2023   dexlansoprazole (DEXILANT) 60 MG capsule Take 1 capsule by mouth daily.   08/22/2023   Ferrous Sulfate  (IRON ) 325 (65 Fe) MG TABS Take 1 tablet by mouth daily.   08/22/2023   hydrALAZINE  (APRESOLINE ) 50 MG tablet Take 1 tablet by mouth 3 (three) times daily.   08/22/2023   labetalol  (NORMODYNE ) 100 MG tablet Take 1 tablet by mouth 2 (two) times daily.   08/22/2023   metFORMIN (GLUCOPHAGE-XR) 500 MG 24 hr tablet Take 500 mg by mouth 2 (two) times daily.   08/22/2023   nitroGLYCERIN  (NITROSTAT ) 0.4 MG SL tablet Place 0.4 mg under the tongue every 5 (five) minutes as needed.   Taking As Needed   rosuvastatin  (CRESTOR ) 40 MG tablet Take 40 mg  by mouth daily.   08/22/2023   sitaGLIPtin (JANUVIA) 100 MG tablet Take 100 mg by mouth daily.   08/22/2023   Testosterone  1.62 % GEL Apply 2 Pump topically daily. One pump on each arm 225 g 3 08/22/2023   torsemide  (DEMADEX ) 20 MG tablet Take 1 tablet (20 mg total) by mouth 2 (two) times daily.   08/22/2023   Scheduled:  labetalol   100 mg Oral BID   pantoprazole  (PROTONIX ) IV  40 mg Intravenous Q12H   rosuvastatin   40 mg Oral Daily   sodium chloride  flush  3 mL Intravenous Q12H   Continuous: PRN:hydrALAZINE , ondansetron  **OR** ondansetron  (ZOFRAN ) IV, sodium chloride  flush Anti-infectives (From admission, onward)    None      Scheduled Meds:  labetalol   100 mg Oral BID   pantoprazole  (PROTONIX ) IV  40 mg Intravenous Q12H   rosuvastatin   40 mg Oral Daily   sodium chloride  flush  3 mL Intravenous Q12H   Continuous Infusions: PRN Meds:.hydrALAZINE , ondansetron  **OR** ondansetron  (ZOFRAN ) IV, sodium chloride  flush   Assessment: Principal Problem:   CHF exacerbation (HCC) Active Problems:   Essential hypertension   OSA (obstructive sleep apnea)   CAD (coronary artery disease), native coronary artery   Hyperlipidemia associated with type 2 diabetes mellitus (HCC)   Acute blood loss anemia   Acute heart failure with preserved ejection fraction (HFpEF) (HCC)   Paroxysmal atrial fibrillation (HCC)   Acute kidney injury superimposed on chronic kidney disease (HCC)   Upper GI bleeding   Pleural effusion   Symptomatic anemia  78 year old male with history of CHF, coronary artery disease s/p CABG (LIMA-LAD, occluded SVG to left circumflex, PCI SVG to PDA 2021), paroxysmal A-fib on Plavix  and Eliquis  as outpatient cirrhosis of liver he is admitted with shortness of breath secondary to bilateral pleural effusions, right greater than left, volume overload, worsening CKD and anemia  Plan: Respiratory distress secondary to bilateral pleural effusions, right greater than left Status post  therapeutic and diagnostic right-sided thoracentesis on 1/10, 1 L of amber-colored fluid was removed Serum to  pleural fluid albumin gradient (4.1-1.6)  is > 1.2g/dl and serum to pleural fluid protein gradient (6.9-3) is >2.5g/dl consistent with transudative effusion  Based on the criteria mentioned below, pleural fluid 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  However, diuretics have been started prior to pleural fluid analysis which can alter the interpretation  Cell count <250 polymorphonuclear cells per mm3  Protein <2.5 g/dL  Pleural fluid/serum total protein ratio <0.5  Serum-to-pleural fluid albumin gradient >1.1 g/dL  Glucose level similar to that of serum  Patient has been aggressively diuresed during his hospital stay, diuretics were held yesterday due to worsening renal function Respiratory distress has improved, currently oxygenating well on room air Maintain strict low-sodium diet  Dark stools, ongoing, worsening anemia S/p PRBCs, hemoglobin is downtrending, receiving blood transfusion today Known history of gastric polyps, resected in the past Plavix  and Eliquis  have been held since admission With history of cirrhosis of liver, recommend EGD for further evaluation Continue Protonix  40 mg IV twice daily N.p.o. effective midnight except ice chips and meds Will proceed with upper endoscopy tomorrow with Dr. Jinny  Decompensated cirrhosis of liver, MELD Na - 21, volume overload, ascites and hepatic hydrothorax Secondary liver disease workup in process Viral hepatitis panel negative, serum ferritin 125, percent sat less than 50%,  autoimmune and metabolic labs are in process Ultrasound with Dopplers negative for portal vein thrombosis and normal direction blood flow in the portal vein Management of ascites, volume overload as above Restart diuretics when able to if renal function allows PSE:  None HCC: No liver lesions HRS: Worsening renal function precipitated by aggressive diuresis.  Diuretics have been held If renal function is worsening, administer IV albumin and consult nephrology Close follow-up with GI upon discharge  Dr. Jinny will cover from tomorrow     LOS: 3 days   [name] 08/25/2023, 11:40 AM

## 2023-08-25 NOTE — Progress Notes (Signed)
 [facility] Cardiology  SUBJECTIVE: Patient laying in bed, denies chest pain, reports breathing has improved   Vitals:   08/25/23 0420 08/25/23 0428 08/25/23 0544 08/25/23 0802  BP:  (!) 142/74  132/74  Pulse:  (!) 59  61  Resp:  20  20  Temp:  97.8 F (36.6 C)  97.8 F (36.6 C)  TempSrc:  Oral  Oral  SpO2:  92%  94%  Weight: 92.4 kg  94.1 kg   Height:         Intake/Output Summary (Last 24 hours) at 08/25/2023 9046 Last data filed at 08/25/2023 0400 Gross per 24 hour  Intake 1440 ml  Output 575 ml  Net 865 ml      PHYSICAL EXAM  General: Well developed, well nourished, in no acute distress HEENT:  Normocephalic and atramatic Neck:  No JVD.  Lungs: Clear bilaterally to auscultation and percussion. Heart: HRRR . Normal S1 and S2 without gallops or murmurs.  Abdomen: Bowel sounds are positive, abdomen soft and non-tender  Msk:  Back normal, normal gait. Normal strength and tone for age. Extremities: No clubbing, cyanosis or edema.   Neuro: Alert and oriented X 3. Psych:  Good affect, responds appropriately   LABS: Basic Metabolic Panel: Recent Labs    08/24/23 0415 08/25/23 0814  NA 141 137  K 3.4* 3.3*  CL 105 103  CO2 25 25  GLUCOSE 120* 135*  BUN 47* 42*  CREATININE 2.97* 3.01*  CALCIUM  10.3 9.7   Liver Function Tests: Recent Labs    08/23/23 0452  AST 18  ALT 17  ALKPHOS 85  BILITOT 1.5*  PROT 6.9  ALBUMIN 4.1   No results for input(s): LIPASE, AMYLASE in the last 72 hours. CBC: Recent Labs    08/24/23 0415 08/24/23 1633 08/25/23 0547  WBC 3.9*  --  4.6  HGB 7.7* 7.3* 7.3*  HCT 24.3* 22.7* 22.8*  MCV 104.7*  --  102.7*  PLT 95*  --  91*   Cardiac Enzymes: No results for input(s): CKTOTAL, CKMB, CKMBINDEX, TROPONINI in the last 72 hours. BNP: Invalid input(s): POCBNP D-Dimer: No results for input(s): DDIMER in the last 72 hours. Hemoglobin A1C: No results for input(s): HGBA1C in the last 72 hours. Fasting Lipid  Panel: No results for input(s): CHOL, HDL, LDLCALC, TRIG, CHOLHDL, LDLDIRECT in the last 72 hours. Thyroid  Function Tests: No results for input(s): TSH, T4TOTAL, T3FREE, THYROIDAB in the last 72 hours.  Invalid input(s): FREET3 Anemia Panel: Recent Labs    08/22/23 1300  VITAMINB12 497  FOLATE 8.9  FERRITIN 125  TIBC 371  IRON  51  RETICCTPCT 3.8*    US  LIVER DOPPLER Result Date: 08/24/2023 CLINICAL DATA:  Cirrhosis. EXAM: DUPLEX ULTRASOUND OF LIVER TECHNIQUE: Color and duplex Doppler ultrasound was performed to evaluate the hepatic in-flow and out-flow vessels. COMPARISON:  CT chest abdomen pelvis 08/20/2023 and abdominal ultrasound dated 08/06/2023 FINDINGS: Liver: Liver contour is mildly nodular. Liver parenchyma is mildly heterogeneous. No discrete liver lesion. Again noted are small echogenic foci along the gallbladder wall. These may represent small polyps and better characterized on the previous exam from 08/06/2023. Main Portal Vein size: 2.4 cm Portal Vein Velocities Main Prox:  39 cm/sec Main Mid: 37 cm/sec Main Dist:  36 cm/sec Right: 44 cm/sec Left: 43 cm/sec Hepatic Vein Velocities Right:  32 cm/sec Middle:  39 cm/sec Left:  33 cm/sec IVC: Present and patent with normal respiratory phasicity. Hepatic Artery Velocity:  41 cm/sec Splenic Vein Velocity:  32  cm/sec Spleen: 13.3 cm x 5.5 cm x 12.8 cm with a total volume of 487 cm^3 (411 cm^3 is upper limit normal) Portal Vein Occlusion/Thrombus: No Splenic Vein Occlusion/Thrombus: No Ascites: Present Varices: None Bilateral pleural effusions. Normal hepatopetal flow in the portal veins. Normal hepatofugal flow in the hepatic veins. Small amount of ascites around the spleen. IMPRESSION: 1. Patent portal vein with normal direction of flow. 2. Liver contour is mildly nodular and concerning for cirrhosis. 3. Mild splenomegaly. 4. Bilateral pleural effusions.  Small amount of ascites. Electronically Signed   By: Juliene Balder  M.D.   On: 08/24/2023 10:19   US  THORACENTESIS ASP PLEURAL SPACE W/IMG GUIDE Result Date: 08/23/2023 INDICATION: Patient is a 78 y/o male with history of CAD- s/p CABG, pulmonary HTN, HTN, OSA, type 2 diabetes mellitus, hypothyroidism, heart failure, anemia, and kidney disease. Patient presents for a diagnostic and therapeutic thoracentesis due to bilateral pleural effusions. EXAM: ULTRASOUND GUIDED right sided THORACENTESIS MEDICATIONS: 10mL lidocaine  1% COMPLICATIONS: None immediate. PROCEDURE: An ultrasound guided thoracentesis was thoroughly discussed with the patient and questions answered. The benefits, risks, alternatives and complications were also discussed. The patient understands and wishes to proceed with the procedure. Written consent was obtained. Ultrasound was performed to localize and mark an adequate pocket of fluid in the right chest. The area was then prepped and draped in the normal sterile fashion. 1% Lidocaine  was used for local anesthesia. Under ultrasound guidance a 6 Fr Safe-T-Centesis catheter was introduced. Thoracentesis was performed. The catheter was removed and a dressing applied. FINDINGS: A total of approximately 1L of amber colored fluid was removed. Samples were sent to the laboratory as requested by the clinical team. IMPRESSION: Successful ultrasound guided right thoracentesis yielding 1L of pleural fluid. Performed by Daved Hipp PA-C Electronically Signed   By: Wilkie Lent M.D.   On: 08/23/2023 13:32   DG Chest Port 1 View Result Date: 08/23/2023 CLINICAL DATA:  Pleural effusion.  Status post right thoracentesis. EXAM: PORTABLE CHEST 1 VIEW COMPARISON:  Chest radiographs 08/22/2023 FINDINGS: The cardiac silhouette remains enlarged. Aortic atherosclerosis and previous CABG are again noted. Mild pulmonary vascular congestion is similar to the prior study. There are persistent small bilateral pleural effusions with bibasilar atelectasis. No pneumothorax is  identified. IMPRESSION: Persistent small bilateral pleural effusions and bibasilar atelectasis. No pneumothorax. Electronically Signed   By: Dasie Hamburg M.D.   On: 08/23/2023 13:00     Echo EF 60-65%, mild to moderate aortic insufficiency, mild to moderate mitral regurgitation 08/22/2023  TELEMETRY: :  ASSESSMENT AND PLAN:  Principal Problem:   CHF exacerbation (HCC) Active Problems:   Essential hypertension   OSA (obstructive sleep apnea)   CAD (coronary artery disease), native coronary artery   Hyperlipidemia associated with type 2 diabetes mellitus (HCC)   Acute blood loss anemia   Acute heart failure with preserved ejection fraction (HFpEF) (HCC)   Paroxysmal atrial fibrillation (HCC)   Acute kidney injury superimposed on chronic kidney disease (HCC)   Upper GI bleeding   Pleural effusion   Symptomatic anemia    1.  Acute on chronic HFpEF, improved after diuresis, oxygen saturation 94% on room air (EF 60-65%, 08/22/2023) 2.  Coronary artery disease, status post CABG (LIMA-LAD, occluded SVG to left circumflex, PCI SVG to PDA 2021), currently without chest pain 3.  Paroxysmal atrial fibrillation, Eliquis  on hold due to anemia and upper GI bleed, labetalol  for rate and rhythm control 4.  Upper GI bleed, hemoglobin 6.9, went to 8.1 after  transfusion 1 unit, today 7.3, awaiting decision for timing of EGD  Recommendations  1.  Agree with current therapy 2.  Continue labetalol  for rate and rhythm control 3.  Continue to hold Eliquis  for now 4.  EGD pending   Marsa Dooms, MD, PhD, FACC 08/25/2023 9:53 AM

## 2023-08-25 NOTE — TOC Progression Note (Signed)
 Transition of Care [facility]) - Progression Note    Patient Details  Name: [name] MRN: [id number] Date of Birth: 04-10-1946  Transition of Care [facility]) CM/SW Contact  Heron KATHEE Edison, RN Phone Number: 08/25/2023, 2:26 PM  Transition of Care [facility]) - Inpatient Brief Assessment   Patient Details  Name: [name] MRN: [id number] Date of Birth: 29-Nov-1945  Transition of Care [facility]) CM/SW Contact:    Heron KATHEE Edison, RN Phone Number: 08/25/2023, 2:26 PM   Clinical Narrative: 1/12: Presented to [facility] ED with complaints of Shortness of Breath worse on exertions. Worsening fatigue over past several months. PMH significant for CAD s/p cabbage, pulmonary HTX, HTN, HDL, OSA, DM type 2. DX was acute on chronic HF, Upper GI Bleed, anemia, CKD. Followed by [facility]. Spoke with patient regarding CM discharge planning and PT recommendations for post discharge HH PT, but patient declined HH pt and OP PT. Stated he though he would do just fine with out either.     Bing Edison MSN RN CM  Care Management Department.  [place]  [facility] Direct Dial: [phone] (Weekends Only) [facility] Main Office Phone: [phone] [facility] Fax: [phone]     Transition of Care Asessment: Insurance and Status: Insurance coverage has been reviewed Patient has primary care physician: Yes Home environment has been reviewed: From home with spouse and children, one level home with stairs with dual rails going into home. Prior level of function:: Independent/Modified Independent; driving. DME at home [place], and toilet riser Prior/Current Home Services: No current home services   Readmission risk has been reviewed: Yes (18%) Transition of care needs: transition of care needs identified, TOC will continue to follow        Expected Discharge Plan and Services                                               Social Determinants of Health (SDOH) Interventions SDOH Screenings   Food  Insecurity: No Food Insecurity (08/23/2023)  Housing: Low Risk  (08/23/2023)  Transportation Needs: No Transportation Needs (08/23/2023)  Utilities: Not At Risk (08/23/2023)  Social Connections: Moderately Isolated (08/23/2023)  Tobacco Use: Medium Risk (08/23/2023)    Readmission Risk Interventions     No data to display

## 2023-08-25 NOTE — Progress Notes (Signed)
 Patient unit of PRBC completed. Report called to receiving nurse. Patient transferred per protocol.

## 2023-08-25 NOTE — Plan of Care (Signed)

## 2023-08-25 NOTE — Progress Notes (Signed)
 PROGRESS NOTE    [name]   [name] DOB: 1945-11-02  DOA: 08/22/2023 Date of Service: 08/25/23 which is hospital day 3  PCP: [name], MD    Hospital course / significant events:   HPI: [name] is a 78 y.o. male with medical history significant of CAD s/p CABG and DES, severe pulmonary hypertension, hypertension, hyperlipidemia, OSA, type 2 diabetes, hypothyroidism, paroxysmal Afib, HFpEF (severe LVH, mild AS, moderate AI, moderate to severe MR, moderate TR by echo 09/2022). To ED 08/22/23 c/o worsening fatigue over the past 3-4+ months, worsening orthopnea, PND, LE swelling.  Was evaluated by GI on 08/20/2023 - concern for upper GI bleeding. Baseline hgb 10. Hgb 8 during that evaluation, recs for EGD pending cardiology evaulation in setting of HFpEF etc. Pt states that stools are chronically dark, more dark over the past several weeks.   01/09: admitted to hospitalist service w/ acute on chronic HFpEF, upper GIB, ABLA (Hgb 6.9), AKI on CKD (Cr 2.5, baseline around 1.8). Also of note - moderate R and large L pleural effusion on CT imaging 08/19/2022 in setting of acute on chronic HFpEF. Per GI - holding any endoscopic eval/tx for now pending cardio eval / improvement in fluids status. Per cardiology - diurese, no diagnostics. IR consult for thoracentesis 01/10: AKI no improvement (Cr 2.63), Hgb improved to 7.5. Echo EF 60-65% and normal diastolic parameters. Per GI - reassess pending improvement in cardiopulm status. Per cardiology - continue diuresis. Total UOP approx 3L. R thoracentesis yield 1L fluid, more was present but only 1L removed.  01/11: Cr going up, holding diuresis.   01/12: Cr up to 3.01, Hgb to 7.3 question hypoperfusion - will follow FeNa and UA, 1 unit PRBC. EGD pending   Consultants:  Gastroenterology Cardiology  IR  Procedures/Surgeries: 08/23/23 R thoracentesis       ASSESSMENT & PLAN:   Upper GI bleeding ABLA PRBC transfusion x1 unit  01/09, 1 unit 01/12 IV PPI bid H/H bid  GI following - holding any endoscopic eval/tx for now but anticipate will need scope prior to discharge from hospital Hold anticoagulation, antiplatelet, NSAID Trend CBC/HH  Transfuse 1 unit PRBC today 08/25/23  page GI with any acute hemodynamic changes, or signs of active GI bleeding   Acute kidney injury superimposed on chronic kidney disease stage 3a ? Component of cardiorenal syndrome but not really improving w/ diuresis   Initially administered diuresis and PRBC transfusion to improve renal perfusion, AKI persisting/worsening Holding lasix  for now  Monitor renal function  FeNa and UA pending urine collection    Acute on chronic heart failure with preserved ejection fraction (HFpEF)  2D ECHO 12/2022 w/ EF 60-65%  Echo this admission also EF 60-65% and normal diastolic w/ mod-severe MR, mod-severe TR, mod AI, may be contributing to symptoms diuresis held yesterday and today d/t AKI Cardiology consult - no invasive diagnostics or stress test at this time  Strict Is and Os and daily weights   Statin Beta blocker  holding ASA d/t GIB holding ACE/ARB d/t AKI Consider jardiance  if renal function improves   CAD (coronary artery disease), native coronary artery hold antiplatelet regimen in setting of concern for upper GI bleeding  Other cardiac meds as above     Pleural effusion moderate on R large on L  S/p R thoracentesis 01/10 May need to repeat d/t residual fluid, see procedure note     Paroxysmal atrial fibrillation  Rate controlled  Hold eliquis  in setting of  concern for upper GIB  Labetalol  resumed per cardiology, slightly low HR will add holding parameters for bradycardia    Hypokalemia Replace as needed Monitor BMP  Hyperlipidemia associated with CAD, DM2 statin    OSA (obstructive sleep apnea) CPAP qhs    Essential hypertension Monitor w/ diuresis           overweight based on BMI: Body mass index is 28.8 kg/m.   Underweight - under 18  overweight - 25 to 29 obese - 30 or more Class 1 obesity: BMI of 30.0 to 34 Class 2 obesity: BMI of 35.0 to 39 Class 3 obesity: BMI of 40.0 to 49 Super Morbid Obesity: BMI 50-59 Super-super Morbid Obesity: BMI 60+ Significantly low or high BMI is associated with higher medical risk.  Weight management advised as adjunct to other disease management and risk reduction treatments    DVT prophylaxis: SCD IV fluids: no continuous IV fluids while diuresing Nutrition: cardiac diet now, NPO midnight for EGD tm  Central lines / invasive devices: none  Code Status: FULL CODE ACP documentation reviewed:  none on file in [facility]  TOC needs: TBD Barriers to dispo / significant pending items: workup and treatment of acute illness(es) as above, EGD tomorrow              Subjective / Brief ROS:  Patient reports still some SOB but better  Edema is improved  Denies CP/SOB.  Pain controlled. Tolerating diet.  Reports no concerns w/ urination/defecation. Stool still dark  Family Communication: family at bedside on rounds     Objective Findings:  Vitals:   08/25/23 0420 08/25/23 0428 08/25/23 0544 08/25/23 0802  BP:  (!) 142/74  132/74  Pulse:  (!) 59  61  Resp:  20  20  Temp:  97.8 F (36.6 C)  97.8 F (36.6 C)  TempSrc:  Oral  Oral  SpO2:  92%  94%  Weight: 92.4 kg  94.1 kg   Height:        Intake/Output Summary (Last 24 hours) at 08/25/2023 1547 Last data filed at 08/25/2023 0400 Gross per 24 hour  Intake 720 ml  Output 75 ml  Net 645 ml   Filed Weights   08/24/23 0500 08/25/23 0420 08/25/23 0544  Weight: 92.3 kg 92.4 kg 94.1 kg    Examination:  Physical Exam Cardiovascular:     Rate and Rhythm: Normal rate.     Heart sounds: Murmur heard.  Pulmonary:     Effort: Pulmonary effort is normal.     Breath sounds: Examination of the right-lower field reveals rales. Examination of the left-lower field reveals rales. Rales (improved)  present.  Musculoskeletal:     Right lower leg: Edema present.     Left lower leg: Edema present.  Skin:    General: Skin is warm and dry.     Coloration: Skin is pale.  Neurological:     General: No focal deficit present.     Mental Status: He is alert and oriented to person, place, and time.  Psychiatric:        Mood and Affect: Mood normal.        Behavior: Behavior normal.          Scheduled Medications:   sodium chloride    Intravenous Once   labetalol   100 mg Oral BID   pantoprazole  (PROTONIX ) IV  40 mg Intravenous Q12H   rosuvastatin   40 mg Oral Daily   sodium chloride  flush  3 mL Intravenous Q12H  Continuous Infusions:    PRN Medications:  hydrALAZINE , ondansetron  **OR** ondansetron  (ZOFRAN ) IV, sodium chloride  flush  Antimicrobials from admission:  Anti-infectives (From admission, onward)    None           Data Reviewed:  I have personally reviewed the following...  CBC: Recent Labs  Lab 08/22/23 0730 08/22/23 1300 08/23/23 0452 08/23/23 2052 08/24/23 0415 08/24/23 1633 08/25/23 0547  WBC 4.2  --   --   --  3.9*  --  4.6  HGB 6.9*   < > 7.5* 8.1* 7.7* 7.3* 7.3*  HCT 22.2*   < > 23.6* 25.6* 24.3* 22.7* 22.8*  MCV 105.2*  --   --   --  104.7*  --  102.7*  PLT 103*  --   --   --  95*  --  91*   < > = values in this interval not displayed.   Basic Metabolic Panel: Recent Labs  Lab 08/22/23 0730 08/23/23 0452 08/24/23 0415 08/25/23 0814  NA 136 141 141 137  K 4.5 3.9 3.4* 3.3*  CL 104 107 105 103  CO2 20* 21* 25 25  GLUCOSE 140* 114* 120* 135*  BUN 41* 43* 47* 42*  CREATININE 2.48* 2.63* 2.97* 3.01*  CALCIUM  10.3 10.5* 10.3 9.7   GFR: Estimated Creatinine Clearance: 24 mL/min (A) (by C-G formula based on SCr of 3.01 mg/dL (H)). Liver Function Tests: Recent Labs  Lab 08/23/23 0452  AST 18  ALT 17  ALKPHOS 85  BILITOT 1.5*  PROT 6.9  ALBUMIN 4.1   No results for input(s): LIPASE, AMYLASE in the last 168 hours. No  results for input(s): AMMONIA in the last 168 hours. Coagulation Profile: No results for input(s): INR, PROTIME in the last 168 hours. Cardiac Enzymes: No results for input(s): CKTOTAL, CKMB, CKMBINDEX, TROPONINI in the last 168 hours. BNP (last 3 results) No results for input(s): PROBNP in the last 8760 hours. HbA1C: No results for input(s): HGBA1C in the last 72 hours. CBG: No results for input(s): GLUCAP in the last 168 hours. Lipid Profile: No results for input(s): CHOL, HDL, LDLCALC, TRIG, CHOLHDL, LDLDIRECT in the last 72 hours. Thyroid  Function Tests: No results for input(s): TSH, T4TOTAL, FREET4, T3FREE, THYROIDAB in the last 72 hours. Anemia Panel: No results for input(s): VITAMINB12, FOLATE, FERRITIN, TIBC, IRON , RETICCTPCT in the last 72 hours.  Most Recent Urinalysis On File:     Component Value Date/Time   COLORURINE YELLOW (A) 12/20/2022 1258   APPEARANCEUR HAZY (A) 12/20/2022 1258   LABSPEC 1.013 12/20/2022 1258   PHURINE 7.0 12/20/2022 1258   GLUCOSEU NEGATIVE 12/20/2022 1258   HGBUR NEGATIVE 12/20/2022 1258   BILIRUBINUR NEGATIVE 12/20/2022 1258   KETONESUR NEGATIVE 12/20/2022 1258   PROTEINUR >=300 (A) 12/20/2022 1258   NITRITE NEGATIVE 12/20/2022 1258   LEUKOCYTESUR NEGATIVE 12/20/2022 1258   Sepsis Labs: @LABRCNTIP (procalcitonin:4,lacticidven:4) Microbiology: Recent Results (from the past 240 hours)  Body fluid culture w Gram Stain     Status: None (Preliminary result)   Collection Time: 08/23/23 12:10 PM   Specimen: A: PATH Cytology Pleural fluid   B: PATH Cytology Pleural fluid  Result Value Ref Range Status   Specimen Description   Final    PLEURAL Performed at [facility], [address], [place], [place] [address]    Special Requests   Final    PLEURAL Performed at [facility], [address], [place], [place] [address]    Gram Stain   Final  WBC PRESENT,BOTH PMN  AND MONONUCLEAR NO ORGANISMS SEEN CYTOSPIN SMEAR    Culture   Final    NO GROWTH 2 DAYS Performed at [facility], [address] [place], [place] [address]    Report Status PENDING  Incomplete      Radiology Studies last 3 days: US  LIVER DOPPLER Result Date: 08/24/2023 CLINICAL DATA:  Cirrhosis. EXAM: DUPLEX ULTRASOUND OF LIVER TECHNIQUE: Color and duplex Doppler ultrasound was performed to evaluate the hepatic in-flow and out-flow vessels. COMPARISON:  CT chest abdomen pelvis 08/20/2023 and abdominal ultrasound dated 08/06/2023 FINDINGS: Liver: Liver contour is mildly nodular. Liver parenchyma is mildly heterogeneous. No discrete liver lesion. Again noted are small echogenic foci along the gallbladder wall. These may represent small polyps and better characterized on the previous exam from 08/06/2023. Main Portal Vein size: 2.4 cm Portal Vein Velocities Main Prox:  39 cm/sec Main Mid: 37 cm/sec Main Dist:  36 cm/sec Right: 44 cm/sec Left: 43 cm/sec Hepatic Vein Velocities Right:  32 cm/sec Middle:  39 cm/sec Left:  33 cm/sec IVC: Present and patent with normal respiratory phasicity. Hepatic Artery Velocity:  41 cm/sec Splenic Vein Velocity:  32 cm/sec Spleen: 13.3 cm x 5.5 cm x 12.8 cm with a total volume of 487 cm^3 (411 cm^3 is upper limit normal) Portal Vein Occlusion/Thrombus: No Splenic Vein Occlusion/Thrombus: No Ascites: Present Varices: None Bilateral pleural effusions. Normal hepatopetal flow in the portal veins. Normal hepatofugal flow in the hepatic veins. Small amount of ascites around the spleen. IMPRESSION: 1. Patent portal vein with normal direction of flow. 2. Liver contour is mildly nodular and concerning for cirrhosis. 3. Mild splenomegaly. 4. Bilateral pleural effusions.  Small amount of ascites. Electronically Signed   By: Juliene Balder M.D.   On: 08/24/2023 10:19   US  THORACENTESIS ASP PLEURAL SPACE W/IMG GUIDE Result Date: 08/23/2023 INDICATION: Patient is a 78 y/o male  with history of CAD- s/p CABG, pulmonary HTN, HTN, OSA, type 2 diabetes mellitus, hypothyroidism, heart failure, anemia, and kidney disease. Patient presents for a diagnostic and therapeutic thoracentesis due to bilateral pleural effusions. EXAM: ULTRASOUND GUIDED right sided THORACENTESIS MEDICATIONS: 10mL lidocaine  1% COMPLICATIONS: None immediate. PROCEDURE: An ultrasound guided thoracentesis was thoroughly discussed with the patient and questions answered. The benefits, risks, alternatives and complications were also discussed. The patient understands and wishes to proceed with the procedure. Written consent was obtained. Ultrasound was performed to localize and mark an adequate pocket of fluid in the right chest. The area was then prepped and draped in the normal sterile fashion. 1% Lidocaine  was used for local anesthesia. Under ultrasound guidance a 6 Fr Safe-T-Centesis catheter was introduced. Thoracentesis was performed. The catheter was removed and a dressing applied. FINDINGS: A total of approximately 1L of amber colored fluid was removed. Samples were sent to the laboratory as requested by the clinical team. IMPRESSION: Successful ultrasound guided right thoracentesis yielding 1L of pleural fluid. Performed by Daved Hipp PA-C Electronically Signed   By: Wilkie Lent M.D.   On: 08/23/2023 13:32   DG Chest Port 1 View Result Date: 08/23/2023 CLINICAL DATA:  Pleural effusion.  Status post right thoracentesis. EXAM: PORTABLE CHEST 1 VIEW COMPARISON:  Chest radiographs 08/22/2023 FINDINGS: The cardiac silhouette remains enlarged. Aortic atherosclerosis and previous CABG are again noted. Mild pulmonary vascular congestion is similar to the prior study. There are persistent small bilateral pleural effusions with bibasilar atelectasis. No pneumothorax is identified. IMPRESSION: Persistent small bilateral pleural effusions and bibasilar atelectasis. No pneumothorax.  Electronically Signed   By: Dasie Hamburg M.D.   On: 08/23/2023 13:00   ECHOCARDIOGRAM COMPLETE Result Date: 08/23/2023    ECHOCARDIOGRAM REPORT   Patient Name:   LUKIS BUNT Date of Exam: 08/23/2023 Medical Rec #:  [id number]        Height:       73.0 in Accession #:    [id number]       Weight:       218.3 lb Date of Birth:  11-16-1945         BSA:          2.233 m Patient Age:    77 years         BP:           165/73 mmHg Patient Gender: M                HR:           68 bpm. Exam Location:  [facility] Procedure: 2D Echo, Cardiac Doppler and Color Doppler Indications:     CHF-acute systolic I50.21  History:         Patient has prior history of Echocardiogram examinations, most                  recent 12/23/2022. Arrythmias:LBBB; Signs/Symptoms:Dyspnea.  Sonographer:     Christopher Furnace Referring Phys:  [id number] ELSP[address] PARAS NEWTON Diagnosing Phys: Marsa Dooms MD IMPRESSIONS  1. Left ventricular ejection fraction, by estimation, is 60 to 65%. The left ventricle has normal function. The left ventricle has no regional wall motion abnormalities. Left ventricular diastolic parameters were normal.  2. Right ventricular systolic function is normal. The right ventricular size is normal.  3. Left atrial size was mildly dilated.  4. Right atrial size was mildly dilated.  5. The mitral valve is normal in structure. Mild to moderate mitral valve regurgitation. No evidence of mitral stenosis.  6. The aortic valve is normal in structure. Aortic valve regurgitation is mild to moderate. Mild to moderate aortic valve stenosis.  7. The inferior vena cava is normal in size with greater than 50% respiratory variability, suggesting right atrial pressure of 3 mmHg. FINDINGS  Left Ventricle: Left ventricular ejection fraction, by estimation, is 60 to 65%. The left ventricle has normal function. The left ventricle has no regional wall motion abnormalities. The left ventricular internal cavity size was normal in size. There is  no left ventricular hypertrophy. Left ventricular  diastolic parameters were normal. Right Ventricle: The right ventricular size is normal. No increase in right ventricular wall thickness. Right ventricular systolic function is normal. Left Atrium: Left atrial size was mildly dilated. Right Atrium: Right atrial size was mildly dilated. Pericardium: Trivial pericardial effusion is present. Mitral Valve: The mitral valve is normal in structure. Mild to moderate mitral valve regurgitation. No evidence of mitral valve stenosis. Tricuspid Valve: The tricuspid valve is normal in structure. Tricuspid valve regurgitation is not demonstrated. No evidence of tricuspid stenosis. Aortic Valve: The aortic valve is normal in structure. Aortic valve regurgitation is mild to moderate. Mild to moderate aortic stenosis is present. Aortic valve mean gradient measures 12.7 mmHg. Aortic valve peak gradient measures 22.0 mmHg. Aortic valve  area, by VTI measures 1.57 cm. Pulmonic Valve: The pulmonic valve was normal in structure. Pulmonic valve regurgitation is not visualized. No evidence of pulmonic stenosis. Aorta: The aortic root is normal in size and structure. Venous: The inferior vena cava is normal in size with greater than 50%  respiratory variability, suggesting right atrial pressure of 3 mmHg. IAS/Shunts: No atrial level shunt detected by color flow Doppler.  LEFT VENTRICLE PLAX 2D LVIDd:         5.10 cm   Diastology LVIDs:         3.10 cm   LV e' medial:    6.09 cm/s LV PW:         0.80 cm   LV E/e' medial:  21.7 LV IVS:        1.40 cm   LV e' lateral:   13.80 cm/s LVOT diam:     2.00 cm   LV E/e' lateral: 9.6 LV SV:         73 LV SV Index:   33 LVOT Area:     3.14 cm  RIGHT VENTRICLE RV Basal diam:  4.60 cm RV Mid diam:    4.00 cm LEFT ATRIUM             Index        RIGHT ATRIUM           Index LA diam:        4.80 cm 2.15 cm/m   RA Area:     21.80 cm LA Vol (A2C):   71.1 ml 31.84 ml/m  RA Volume:   64.80 ml  29.02 ml/m LA Vol (A4C):   68.6 ml 30.72 ml/m LA Biplane  Vol: 74.2 ml 33.23 ml/m  AORTIC VALVE AV Area (Vmax):    1.33 cm AV Area (Vmean):   1.38 cm AV Area (VTI):     1.57 cm AV Vmax:           234.67 cm/s AV Vmean:          163.667 cm/s AV VTI:            0.466 m AV Peak Grad:      22.0 mmHg AV Mean Grad:      12.7 mmHg LVOT Vmax:         99.00 cm/s LVOT Vmean:        71.700 cm/s LVOT VTI:          0.232 m LVOT/AV VTI ratio: 0.50  AORTA Ao Root diam: 3.40 cm MITRAL VALVE                TRICUSPID VALVE MV Area (PHT): 4.24 cm     TR Peak grad:   40.4 mmHg MV Decel Time: 179 msec     TR Vmax:        318.00 cm/s MV E velocity: 132.00 cm/s MV A velocity: 50.30 cm/s   SHUNTS MV E/A ratio:  2.62         Systemic VTI:  0.23 m                             Systemic Diam: 2.00 cm Marsa Dooms MD Electronically signed by Marsa Dooms MD Signature Date/Time: 08/23/2023/11:45:16 AM    Final    DG Chest 2 View Result Date: 08/22/2023 CLINICAL DATA:  78 year old male with shortness of breath, fatigue. EXAM: CHEST - 2 VIEW COMPARISON:  CT Chest, Abdomen, and Pelvis 08/20/2023 and earlier. FINDINGS: PA and lateral views 0743 hours today. Stable cardiomegaly and mediastinal contours. Prior CABG. Ongoing bilateral pleural effusions, moderate and slightly larger on the right. No pneumothorax. Bilateral pulmonary vascular congestion is relatively mild, mild interstitial edema is possible. No air bronchograms. Overall ventilation not  significantly changed from recent CT. No acute osseous abnormality identified. Negative visible bowel gas. IMPRESSION: Bilateral pleural effusions and interstitial edema. Ventilation not significantly changed from CT two days ago. Electronically Signed   By: VEAR Hurst M.D.   On: 08/22/2023 07:59      O [facility] 08/25/2023, 3:47 PM    Dictation software may have been used to generate the above note. Typos may occur and escape review in typed/dictated notes. Please contact Dr Marsa directly for clarity if needed.  Staff  may message me via secure chat in Epic  but this may not receive an immediate response,  please page me for urgent matters!  If 7PM-7AM, please contact night coverage [email or web address]

## 2023-08-26 ENCOUNTER — Encounter: Payer: Self-pay | Admitting: Family Medicine

## 2023-08-26 ENCOUNTER — Inpatient Hospital Stay: Payer: Medicare HMO

## 2023-08-26 ENCOUNTER — Ambulatory Visit: Payer: Medicare HMO

## 2023-08-26 ENCOUNTER — Encounter
Admission: EM | Disposition: A | Discharge status: Home / Self Care | Payer: Self-pay | Source: Home / Self Care | Attending: Osteopathic Medicine

## 2023-08-26 DIAGNOSIS — K921 Melena: Secondary | ICD-10-CM

## 2023-08-26 DIAGNOSIS — D649 Anemia, unspecified: Secondary | ICD-10-CM | POA: Diagnosis not present

## 2023-08-26 DIAGNOSIS — J90 Pleural effusion, not elsewhere classified: Secondary | ICD-10-CM | POA: Diagnosis not present

## 2023-08-26 DIAGNOSIS — K317 Polyp of stomach and duodenum: Secondary | ICD-10-CM | POA: Diagnosis not present

## 2023-08-26 DIAGNOSIS — I480 Paroxysmal atrial fibrillation: Secondary | ICD-10-CM | POA: Diagnosis not present

## 2023-08-26 DIAGNOSIS — D62 Acute posthemorrhagic anemia: Secondary | ICD-10-CM | POA: Diagnosis not present

## 2023-08-26 HISTORY — PX: ESOPHAGOGASTRODUODENOSCOPY (EGD) WITH PROPOFOL: SHX5813

## 2023-08-26 LAB — CBC
HCT: 25.2 % — ABNORMAL LOW (ref 39.0–52.0)
Hemoglobin: 8.3 g/dL — ABNORMAL LOW (ref 13.0–17.0)
MCH: 33.1 pg (ref 26.0–34.0)
MCHC: 32.9 g/dL (ref 30.0–36.0)
MCV: 100.4 fL — ABNORMAL HIGH (ref 80.0–100.0)
Platelets: 89 10*3/uL — ABNORMAL LOW (ref 150–400)
RBC: 2.51 MIL/uL — ABNORMAL LOW (ref 4.22–5.81)
RDW: 18.6 % — ABNORMAL HIGH (ref 11.5–15.5)
WBC: 5.1 10*3/uL (ref 4.0–10.5)
nRBC: 0 % (ref 0.0–0.2)

## 2023-08-26 LAB — BASIC METABOLIC PANEL
Anion gap: 10 (ref 5–15)
BUN: 43 mg/dL — ABNORMAL HIGH (ref 8–23)
CO2: 26 mmol/L (ref 22–32)
Calcium: 9.6 mg/dL (ref 8.9–10.3)
Chloride: 103 mmol/L (ref 98–111)
Creatinine, Ser: 2.79 mg/dL — ABNORMAL HIGH (ref 0.61–1.24)
GFR, Estimated: 23 mL/min — ABNORMAL LOW (ref >60)
Glucose, Bld: 141 mg/dL — ABNORMAL HIGH (ref 70–99)
Potassium: 4.1 mmol/L (ref 3.5–5.1)
Sodium: 139 mmol/L (ref 135–145)

## 2023-08-26 LAB — GLUCOSE, CAPILLARY: Glucose-Capillary: 110 mg/dL — ABNORMAL HIGH (ref 70–99)

## 2023-08-26 LAB — MITOCHONDRIAL ANTIBODIES: Mitochondrial M2 Ab, IgG: <20 U (ref 0.0–20.0)

## 2023-08-26 LAB — CERULOPLASMIN: Ceruloplasmin: 19.2 mg/dL (ref 16.0–31.0)

## 2023-08-26 LAB — CYTOLOGY - NON PAP

## 2023-08-26 LAB — ALPHA-1-ANTITRYPSIN: A-1 Antitrypsin, Ser: 187 mg/dL (ref 101–187)

## 2023-08-26 SURGERY — ESOPHAGOGASTRODUODENOSCOPY (EGD) WITH PROPOFOL
Anesthesia: General

## 2023-08-26 MED ORDER — PROPOFOL 10 MG/ML IV BOLUS
INTRAVENOUS | Status: DC | PRN
  Administered 2023-08-26: 50 mg via INTRAVENOUS
  Administered 2023-08-26 (×2): 10 mg via INTRAVENOUS

## 2023-08-26 MED ORDER — LIDOCAINE HCL (CARDIAC) PF 100 MG/5ML IV SOSY
PREFILLED_SYRINGE | INTRAVENOUS | Status: DC | PRN
  Administered 2023-08-26: 60 mg via INTRAVENOUS

## 2023-08-26 NOTE — Op Note (Signed)
 [facility] Gastroenterology Patient Name: [name] Procedure Date: 08/26/2023 12:50 PM MRN: [id number] Account #: [id number] Date of Birth: Jun 21, 1946 Admit Type: Inpatient Age: 78 Room: [facility] ENDO ROOM 3 Gender: Male Note Status: Finalized Instrument Name: Barnie Endoscope [id number] Procedure:             Upper GI endoscopy Indications:           Melena Providers:             [name] MD, MD Referring MD:          Honor BROCKS. Corlis, MD (Referring MD) Medicines:             Propofol  per Anesthesia Complications:         No immediate complications. Procedure:             Pre-Anesthesia Assessment:                        - Prior to the procedure, a History and Physical was                         performed, and patient medications and allergies were                         reviewed. The patient's tolerance of previous                         anesthesia was also reviewed. The risks and benefits                         of the procedure and the sedation options and risks                         were discussed with the patient. All questions were                         answered, and informed consent was obtained. Prior                         Anticoagulants: The patient has taken no anticoagulant                         or antiplatelet agents. ASA Grade Assessment: II - A                         patient with mild systemic disease. After reviewing                         the risks and benefits, the patient was deemed in                         satisfactory condition to undergo the procedure.                        After obtaining informed consent, the endoscope was                         passed under direct vision. Throughout the procedure,  the patient's blood pressure, pulse, and oxygen                         saturations were monitored continuously. The Endoscope                         was introduced through the mouth, and advanced to the                          second part of duodenum. The upper GI endoscopy was                         accomplished without difficulty. The patient tolerated                         the procedure well. Findings:      The examined esophagus was normal.      Multiple pedunculated and sessile polyps with no bleeding and no       stigmata of recent bleeding were found in the entire examined stomach.      The examined duodenum was normal. Impression:            - Normal esophagus.                        - Multiple gastric polyps.                        - Normal examined duodenum.                        - No specimens collected. Recommendation:        - Return patient to hospital ward for ongoing care.                        - Resume previous diet.                        - Continue present medications. Procedure Code(s):     --- Professional ---                        [id number], Esophagogastroduodenoscopy, flexible,                         transoral; diagnostic, including collection of                         specimen(s) by brushing or washing, when performed                         (separate procedure) Diagnosis Code(s):     --- Professional ---                        K92.1, Melena (includes Hematochezia)                        K31.7, Polyp of stomach and duodenum CPT copyright 2022 [employer]. All rights reserved. The codes documented in this report are preliminary and upon coder review may  be revised to meet current compliance requirements. [name] MD,  MD 08/26/2023 1:02:49 PM This report has been signed electronically. Number of Addenda: 0 Note Initiated On: 08/26/2023 12:50 PM Estimated Blood Loss:  Estimated blood loss: none.      [facility]

## 2023-08-26 NOTE — Transfer of Care (Signed)
 Immediate Anesthesia Transfer of Care Note  Patient: [name]  Procedure(s) Performed: ESOPHAGOGASTRODUODENOSCOPY (EGD) WITH PROPOFOL   Patient Location: PACU  Anesthesia Type:General  Level of Consciousness: awake, alert , and oriented  Airway & Oxygen Therapy: Patient Spontanous Breathing  Post-op Assessment: Report given to RN and Post -op Vital signs reviewed and stable  Post vital signs: Reviewed and stable  Last Vitals:  Vitals Value Taken Time  BP    Temp    Pulse 51 08/26/23 1304  Resp    SpO2 94 % 08/26/23 1304  Vitals shown include unfiled device data.  Last Pain:  Vitals:   08/26/23 1210  TempSrc: Temporal  PainSc: 0-No pain      Patients Stated Pain Goal: 0 (08/25/23 2004)  Complications: No notable events documented.

## 2023-08-26 NOTE — Care Management Important Message (Signed)
 Important Message  Patient Details  Name: [name] MRN: [id number] Date of Birth: Feb 02, 1946   Important Message Given:  Yes - Medicare IM     Sherilyn Banker 08/26/2023, 4:08 PM

## 2023-08-26 NOTE — Progress Notes (Signed)
  PT progress note and DC   08/26/23 1552  PT Visit Information  Last PT Received On 08/26/23  History of Present Illness [name] is a 77yoM PMH: CAD s/p CABG and DES, severe pulmonary hypertension, HTN, HLD, OSA, type 2 diabetes, hypoTSH presenting w/ acute on chronic HFpEF, upper GIB, ABLA, acute on chronic kidney disease. Pt is no s/p 1 unit PRBC 08/22/23   Author checking in with patient, now back from upper GI study. Pt eager to eat, then bathe. Pt reports steady improvements in tolerance to overground mobility in past few days since evaluated. Pt has been AMB ad lib in room for toilet needs. Pt denies any specific need for acute PT at this time. No services or DME needed at DC, pt agrees. Will sign off at this time. Please consult us  again should a need arise.   3:55 PM, 08/26/23 [name], PT, DPT Physical Therapist - [facility]  [phone] [facility])

## 2023-08-26 NOTE — Anesthesia Preprocedure Evaluation (Signed)
 Anesthesia Evaluation  Patient identified by MRN, date of birth, ID band Patient awake    Reviewed: Allergy & Precautions, H&P , NPO status , Patient's Chart, lab work & pertinent test results, reviewed documented beta blocker date and time   Airway Mallampati: II   Neck ROM: full    Dental  (+) Poor Dentition   Pulmonary shortness of breath, sleep apnea , former smoker   Pulmonary exam normal        Cardiovascular Exercise Tolerance: Poor hypertension, On Medications + angina with exertion + CAD and +CHF  Normal cardiovascular exam+ dysrhythmias + Valvular Problems/Murmurs  Rhythm:regular Rate:Normal     Neuro/Psych  PSYCHIATRIC DISORDERS      negative neurological ROS     GI/Hepatic Neg liver ROS,GERD  Medicated,,  Endo/Other  negative endocrine ROSdiabetes    Renal/GU Renal disease  negative genitourinary   Musculoskeletal   Abdominal   Peds  Hematology  (+) Blood dyscrasia, anemia   Anesthesia Other Findings Past Medical History: 07/25/2016: (HFpEF) heart failure with preserved ejection fraction  (HCC)     Comment:  a.)TTE 07/25/16: EF >55, triv-mild pan regur, mild AS,               G2DD; b.)TTE 09/02/16: EF >55, triv TR, mild AS; c.)TTE               11/05/16: EF >55, LAE, triv-mild pan regur, mild AS, G2DD;              d.)TTE 01/21/20: EF >55, mod LVH, LAE/RVE, triv-mild               AR/PR/TR, mod MR, mild AS, G2DD; e.)TTE 09/24/22: EF>55,               sev LVH, sev LAE, RAE, mild PR, mod AR/MR/TR, mild-mod               AS, RVSP 74.7; f.)TTE 12/23/22: EF 60-65, LAE, mild-mod               MR/TR, mild AS 05/01/2023: Abnormal urine No date: Anemia No date: Aortic atherosclerosis (HCC) 07/25/2016: Aortic stenosis     Comment:  a.) TTE 07/25/2016: mild (MPG 13.5); b.) TTE 09/02/2016:              mild (MPG 16); c.) TTE 11/05/2016: mild (MPG 9); d.) TTE               01/21/2020: mild (MPG 11.7); e.) TTE  09/24/2022: mild-mod              (MPG 20.3); f.) TTE 12/23/2022: mild (MPG 15) No date: Atrial fibrillation (HCC)     Comment:  a.) CHA2DS2VASc = 6 (age x2, CHF, HTN, vascular disease               history, T2DM);  b.) rate/rhythm maintained on oral               labetalol ; chronically anticoagulated with apixaban  +               clopidogrel  No date: AV block, 1st degree No date: B12 deficiency No date: CKD (chronic kidney disease), stage III (HCC) No date: Colon polyps 05/01/2023: Congestive heart failure (HCC) 08/16/2016: Coronary artery disease     Comment:  a.) s/p 3v CABG 08/30/2016; b.) s/p PCI 11/26/2016 (DES               x 1 oSVG-RCA); c.) s/p PCI 12/17/2016 (DES x  5 --> mLCx               x2, dLCx, pLAD, dLAD); c.) s/p PCI 06/09/2020 (DES x1 -->              ISR oSVG-PDA) 07/03/2023: Cyst of kidney, acquired No date: Dyspnea 05/01/2023: Gastroesophageal reflux disease No date: GERD (gastroesophageal reflux disease) No date: Heart murmur No date: Hepatic steatosis 09/02/2020: History of 2019 novel coronavirus disease (COVID-19)     Comment:  a.) Tx'd with remdesivir  No date: History of blood transfusion 09/02/2020: History of GI bleed     Comment:  a.) admitted to [facility] 09/02/2020 - 09/08/2020 11/26/2016: History of heart artery stent     Comment:  TOTAL of 7 stents: a.) 11/26/2016 --> 3.5 x 22 mm               Resolute Integrity oSVG-RCA; b.) 12/17/2016: 3.0 x 12 mm               Xience Alpine dLCx, 3.5 x 22 mm Resolute Onyx mLCx, 3.5 x              18 mm Resolute Onyx mLCx, 3.0 x 26 mm Resolute Onyx pLAD,              2.0 x 26 mm Resolute Onyx dLAD; c.) 06/09/2020 --> 2.5 x               18 mm Resolute Onyx ISR oSVG-PDA 08/12/2012: History of partial colectomy     Comment:  a.) large gastric hyperplastic polyp removal No date: HLD (hyperlipidemia) No date: Hypertension No date: LBBB (left bundle branch block) No date: Long term current use of anticoagulant      Comment:  a.) apixaban  No date: Long term current use of antithrombotics/antiplatelets     Comment:  a.) clopidogrel  No date: Male hypogonadism     Comment:  a.) on exogenous TRT (1.62% gel) No date: Mild cardiomegaly No date: Mild gynecomastia (bilateral) No date: Nephrolithiasis No date: OSA (obstructive sleep apnea)     Comment:  a.) does not utilize nocturnal PAP therapy 05/01/2023: Personal history of surgery to heart and great vessels,  presenting hazards to health 08/01/2023: Proteinuria, unspecified 09/24/2022: Pulmonary hypertension (HCC)     Comment:  a.) TTE 09/24/2022: RVSP 74.7; b.) TTE 12/23/2022: RVSP               31.3 No date: RBBB (right bundle branch block with left anterior  fascicular block) No date: Right inguinal hernia No date: Right thyroid  nodule 08/30/2016: S/P CABG x 3     Comment:  a.) LIMA-LAD, SVG-PDA, SVG-OM3 05/01/2023: Stage 3b chronic kidney disease (HCC) No date: Type 2 diabetes mellitus treated with insulin  (HCC) 05/01/2023: Type 2 diabetes mellitus with diabetic chronic kidney  disease (HCC) No date: Umbilical hernia No date: Unstable angina (HCC) No date: Vitamin D  deficiency Past Surgical History: No date: APPENDECTOMY 08/16/2016: CARDIAC CATHETERIZATION; Left     Comment:  Procedure: Left Heart Cath and Coronary Angiography;                Surgeon: Cara JONETTA Lovelace, MD;  Location: [facility] INVASIVE               CV LAB;  Service: Cardiovascular;  Laterality: Left; 09/07/2020: COLONOSCOPY; N/A     Comment:  Procedure: COLONOSCOPY;  Surgeon: Janalyn Keene NOVAK,               MD;  Location: [facility]  ENDOSCOPY;  Service: Endoscopy;                Laterality: N/A; 05/17/2021: COLONOSCOPY WITH PROPOFOL ; N/A     Comment:  Procedure: COLONOSCOPY WITH PROPOFOL ;  Surgeon:               Janalyn Keene NOVAK, MD;  Location: [facility] ENDOSCOPY;                Service: Endoscopy;  Laterality: N/A; 11/26/2016: CORONARY ANGIOPLASTY WITH STENT PLACEMENT;  Left     Comment:  Procedure: CORONARY ANGIOPLASTY WITH STENT PLACEMENT;               Location: [facility]; Surgeon: Alm Dais, MD 08/30/2016: CORONARY ARTERY BYPASS GRAFT; N/A     Comment:  Procedure: CORONARY ARTERY BYPASS GRAFT; Location: [facility];              Surgeon: Juliene Pouch, MD 06/09/2020: CORONARY STENT INTERVENTION; N/A     Comment:  Procedure: CORONARY STENT INTERVENTION;  Surgeon:               Florencio Cara BIRCH, MD;  Location: [facility] INVASIVE CV LAB;               Service: Cardiovascular;  Laterality: N/A; 12/17/2016: CORONARY STENT INTERVENTION; Left     Comment:  Procedure: CORONARY STENT INTERVENTION; Location: [facility];               Surgeon: Alm Dais, MD 09/22/2020: ENDOSCOPIC MUCOSAL RESECTION; N/A     Comment:  Procedure: ENDOSCOPIC MUCOSAL RESECTION;  Surgeon:               [name] Aloha Raddle., MD;  Location: [facility] ENDOSCOPY;                Service: Gastroenterology;  Laterality: N/A; 09/07/2020: ESOPHAGOGASTRODUODENOSCOPY; N/A     Comment:  Procedure: ESOPHAGOGASTRODUODENOSCOPY (EGD);  Surgeon:               Janalyn Keene NOVAK, MD;  Location: [facility] ENDOSCOPY;                Service: Endoscopy;  Laterality: N/A; 09/22/2020: ESOPHAGOGASTRODUODENOSCOPY (EGD) WITH PROPOFOL ; N/A     Comment:  Procedure: ESOPHAGOGASTRODUODENOSCOPY (EGD) WITH               PROPOFOL ;  Surgeon: [name] Aloha Raddle., MD;                Location: [facility] ENDOSCOPY;  Service: Gastroenterology;                Laterality: N/A; No date: FRACTURE SURGERY 09/22/2020: HEMOSTASIS CLIP PLACEMENT     Comment:  Procedure: HEMOSTASIS CLIP PLACEMENT;  Surgeon:               [name] Aloha Raddle., MD;  Location: [facility] ENDOSCOPY;                Service: Gastroenterology;; 09/22/2020: HEMOSTASIS CONTROL     Comment:  Procedure: HEMOSTASIS CONTROL;  Surgeon: [name] Aloha Raddle., MD;  Location: [facility] ENDOSCOPY;  Service:               Gastroenterology;; 02/19/2023: INSERTION OF MESH     Comment:   Procedure: INSERTION OF MESH;  Surgeon: Desiderio Schanz,               MD;  Location: [facility] ORS;  Service: General;;  umbilical 08/12/2012: LAPAROSCOPIC PARTIAL RIGHT  COLECTOMY; Right     Comment:  Procedure: LAPAROSCOPIC PARTIAL RIGHT COLECTOMY;               Location: [facility]; Surgeon: Unknown Sharps, MD 06/09/2020: LEFT HEART CATH AND CORONARY ANGIOGRAPHY; Left     Comment:  Procedure: LEFT HEART CATH AND CORONARY ANGIOGRAPHY;                Surgeon: Florencio Cara BIRCH, MD;  Location: [facility] INVASIVE              CV LAB;  Service: Cardiovascular;  Laterality: Left; 11/20/2016: LEFT HEART CATH AND CORS/GRAFTS ANGIOGRAPHY; Left     Comment:  Procedure: Left Heart Cath and Cors/Grafts Angiography;               Surgeon: Cara BIRCH Florencio, MD;  Location: [facility] INVASIVE               CV LAB;  Service: Cardiovascular;  Laterality: Left; 09/22/2020: POLYPECTOMY     Comment:  Procedure: POLYPECTOMY;  Surgeon: [name] Aloha Raddle., MD;  Location: [facility] ENDOSCOPY;  Service:               Gastroenterology;; 09/22/2020: SUBMUCOSAL LIFTING INJECTION     Comment:  Procedure: SUBMUCOSAL LIFTING INJECTION;  Surgeon:               [name] Aloha Raddle., MD;  Location: [facility] ENDOSCOPY;                Service: Gastroenterology;; 02/19/2023: UMBILICAL HERNIA REPAIR; N/A     Comment:  Procedure: HERNIA REPAIR UMBILICAL ADULT, open;                Surgeon: Desiderio Schanz, MD;  Location: [facility] ORS;                Service: General;  Laterality: N/A; 02/19/2023: XI ROBOTIC ASSISTED INGUINAL HERNIA REPAIR WITH MESH; Right     Comment:  Procedure: XI ROBOTIC ASSISTED INGUINAL HERNIA REPAIR               WITH MESH;  Surgeon: Desiderio Schanz, MD;  Location: [facility]               ORS;  Service: General;  Laterality: Right; BMI    Body Mass Index: 27.22 kg/m     Reproductive/Obstetrics negative OB ROS                             Anesthesia Physical Anesthesia Plan  ASA: 3  Anesthesia  Plan: General   Post-op Pain Management:    Induction:   PONV Risk Score and Plan:   Airway Management Planned:   Additional Equipment:   Intra-op Plan:   Post-operative Plan:   Informed Consent: I have reviewed the patients History and Physical, chart, labs and discussed the procedure including the risks, benefits and alternatives for the proposed anesthesia with the patient or authorized representative who has indicated his/her understanding and acceptance.     Dental Advisory Given  Plan Discussed with: CRNA  Anesthesia Plan Comments:        Anesthesia Quick Evaluation

## 2023-08-26 NOTE — Plan of Care (Signed)

## 2023-08-26 NOTE — Progress Notes (Signed)
 PROGRESS NOTE    [name]   [name] DOB: 06/08/1946  DOA: 08/22/2023 Date of Service: 08/26/23 which is hospital day 4  PCP: [name], MD    Hospital course / significant events:   HPI: [name] is a 78 y.o. male with medical history significant of CAD s/p CABG and DES, severe pulmonary hypertension, hypertension, hyperlipidemia, OSA, type 2 diabetes, hypothyroidism, paroxysmal Afib, HFpEF (severe LVH, mild AS, moderate AI, moderate to severe MR, moderate TR by echo 09/2022). To ED 08/22/23 c/o worsening fatigue over the past 3-4+ months, worsening orthopnea, PND, LE swelling.  Was evaluated by GI on 08/20/2023 - concern for upper GI bleeding. Baseline hgb 10. Hgb 8 during that evaluation, recs for EGD pending cardiology evaulation in setting of HFpEF etc. Pt states that stools are chronically dark, more dark over the past several weeks.   01/09: admitted to hospitalist service w/ acute on chronic HFpEF, upper GIB, ABLA (Hgb 6.9), AKI on CKD (Cr 2.5, baseline around 1.8). Also of note - moderate R and large L pleural effusion on CT imaging 08/19/2022 in setting of acute on chronic HFpEF. Per GI - holding any endoscopic eval/tx for now pending cardio eval / improvement in fluids status. Per cardiology - diurese, no diagnostics. IR consult for thoracentesis 01/10: AKI no improvement (Cr 2.63), Hgb improved to 7.5. Echo EF 60-65% and normal diastolic parameters. Per GI - reassess pending improvement in cardiopulm status. Per cardiology - continue diuresis. Total UOP approx 3L. R thoracentesis yield 1L fluid, more was present but only 1L removed.  01/11: Cr going up, holding diuresis.   01/12: Cr up to 3.01, Hgb to 7.3 question hypoperfusion - will follow FeNa and UA, 1 unit PRBC. EGD tm 01/13:  EGD stblae gastric polyps no bleeding. AKI seems to be improved after PRBC transfusion and holding lasix    Consultants:  Gastroenterology Cardiology   IR  Procedures/Surgeries: 08/23/23 R thoracentesis       ASSESSMENT & PLAN:   Upper GI bleeding ABLA PRBC transfusion x1 unit 01/09, 1 unit 01/12 IV PPI bid GI following, EGD today, stable gastric polyps, no current GI bleed Hold anticoagulation, antiplatelet, NSAID Trend CBC/HH  page GI with any acute hemodynamic changes, or signs of active GI bleeding   Acute kidney injury superimposed on chronic kidney disease stage 3a ? Component of cardiorenal syndrome but not really improved w/ diuresis, seems to be improved after PRBC transfusion and holding lasix   Holding lasix  for now  Monitor renal function  FeNa and UA pending urine collection    Acute on chronic heart failure with preserved ejection fraction (HFpEF)  2D ECHO 12/2022 w/ EF 60-65%  Echo this admission also EF 60-65% and normal diastolic w/ mod-severe MR, mod-severe TR, mod AI, may be contributing to symptoms diuresis held past 2 days and today d/t AKI, may consider resume po tomorrow  Cardiology consult - no invasive diagnostics or stress test at this time  Strict Is and Os and daily weights   Statin Beta blocker  holding ASA d/t GIB holding ACE/ARB d/t AKI Consider jardiance  if renal function improves   CAD (coronary artery disease), native coronary artery hold antiplatelet regimen in setting of concern for upper GI bleeding  Other cardiac meds as above     Pleural effusion moderate on R large on L  S/p R thoracentesis 01/10 May need to repeat d/t residual fluid, see procedure note     Paroxysmal atrial fibrillation  Rate controlled  Hold eliquis  in setting of concern for upper GIB  Labetalol  resumed per cardiology, slightly low HR will add holding parameters for bradycardia    Hypokalemia Replace as needed Monitor BMP  Hyperlipidemia associated with CAD, DM2 statin    OSA (obstructive sleep apnea) CPAP qhs    Essential hypertension Monitor w/ diuresis           overweight based on BMI:  Body mass index is 28.8 kg/m.  Underweight - under 18  overweight - 25 to 29 obese - 30 or more Class 1 obesity: BMI of 30.0 to 34 Class 2 obesity: BMI of 35.0 to 39 Class 3 obesity: BMI of 40.0 to 49 Super Morbid Obesity: BMI 50-59 Super-super Morbid Obesity: BMI 60+ Significantly low or high BMI is associated with higher medical risk.  Weight management advised as adjunct to other disease management and risk reduction treatments    DVT prophylaxis: SCD IV fluids: no continuous IV fluids d/t concern for CHF Nutrition: cardiac diet now  Central lines / invasive devices: none  Code Status: FULL CODE ACP documentation reviewed:  none on file in [facility]  TOC needs: TBD Barriers to dispo / significant pending items: AKI, if improving can likely restart lasix  po and dc next 1-2 days              Subjective / Brief ROS:  Patient reports feeling improved today following transfusion yesterday  Edema is improved / stable from yesterday  Denies CP/SOB.  Pain controlled. Tolerating diet.  Reports no concerns w/ urination/defecation. Stool still dark  Family Communication: none at this time     Objective Findings:  Vitals:   08/26/23 1304 08/26/23 1314 08/26/23 1324 08/26/23 1404  BP: (!) 148/66 134/62 (!) 144/71 134/83  Pulse:    (!) 52  Resp: 16 18 18 18   Temp: (!) 96.6 F (35.9 C)   97.8 F (36.6 C)  TempSrc:    Oral  SpO2:    93%  Weight:      Height:        Intake/Output Summary (Last 24 hours) at 08/26/2023 1430 Last data filed at 08/25/2023 2056 Gross per 24 hour  Intake 100 ml  Output --  Net 100 ml   Filed Weights   08/25/23 0544 08/26/23 0500 08/26/23 1210  Weight: 94.1 kg 93.6 kg 93.6 kg    Examination:  Physical Exam Cardiovascular:     Rate and Rhythm: Normal rate.     Heart sounds: Murmur heard.  Pulmonary:     Effort: Pulmonary effort is normal.     Breath sounds: Normal breath sounds. No rales (improved).  Musculoskeletal:      Right lower leg: Edema present.     Left lower leg: Edema present.  Skin:    General: Skin is warm and dry.  Neurological:     General: No focal deficit present.     Mental Status: He is alert and oriented to person, place, and time.  Psychiatric:        Mood and Affect: Mood normal.        Behavior: Behavior normal.          Scheduled Medications:   labetalol   100 mg Oral BID   pantoprazole  (PROTONIX ) IV  40 mg Intravenous Q12H   rosuvastatin   40 mg Oral Daily   sodium chloride  flush  3 mL Intravenous Q12H    Continuous Infusions:    PRN Medications:  hydrALAZINE , ondansetron  **OR** ondansetron  (ZOFRAN ) IV, sodium chloride   flush  Antimicrobials from admission:  Anti-infectives (From admission, onward)    None           Data Reviewed:  I have personally reviewed the following...  CBC: Recent Labs  Lab 08/22/23 0730 08/22/23 1300 08/23/23 2052 08/24/23 0415 08/24/23 1633 08/25/23 0547 08/26/23 0440  WBC 4.2  --   --  3.9*  --  4.6 5.1  HGB 6.9*   < > 8.1* 7.7* 7.3* 7.3* 8.3*  HCT 22.2*   < > 25.6* 24.3* 22.7* 22.8* 25.2*  MCV 105.2*  --   --  104.7*  --  102.7* 100.4*  PLT 103*  --   --  95*  --  91* 89*   < > = values in this interval not displayed.   Basic Metabolic Panel: Recent Labs  Lab 08/22/23 0730 08/23/23 0452 08/24/23 0415 08/25/23 0814 08/26/23 0440  NA 136 141 141 137 139  K 4.5 3.9 3.4* 3.3* 4.1  CL 104 107 105 103 103  CO2 20* 21* 25 25 26   GLUCOSE 140* 114* 120* 135* 141*  BUN 41* 43* 47* 42* 43*  CREATININE 2.48* 2.63* 2.97* 3.01* 2.79*  CALCIUM  10.3 10.5* 10.3 9.7 9.6   GFR: Estimated Creatinine Clearance: 25.9 mL/min (A) (by C-G formula based on SCr of 2.79 mg/dL (H)). Liver Function Tests: Recent Labs  Lab 08/23/23 0452  AST 18  ALT 17  ALKPHOS 85  BILITOT 1.5*  PROT 6.9  ALBUMIN 4.1   No results for input(s): LIPASE, AMYLASE in the last 168 hours. No results for input(s): AMMONIA in the last 168  hours. Coagulation Profile: No results for input(s): INR, PROTIME in the last 168 hours. Cardiac Enzymes: No results for input(s): CKTOTAL, CKMB, CKMBINDEX, TROPONINI in the last 168 hours. BNP (last 3 results) No results for input(s): PROBNP in the last 8760 hours. HbA1C: No results for input(s): HGBA1C in the last 72 hours. CBG: Recent Labs  Lab 08/26/23 1217  GLUCAP 110*   Lipid Profile: No results for input(s): CHOL, HDL, LDLCALC, TRIG, CHOLHDL, LDLDIRECT in the last 72 hours. Thyroid  Function Tests: No results for input(s): TSH, T4TOTAL, FREET4, T3FREE, THYROIDAB in the last 72 hours. Anemia Panel: No results for input(s): VITAMINB12, FOLATE, FERRITIN, TIBC, IRON , RETICCTPCT in the last 72 hours.  Most Recent Urinalysis On File:     Component Value Date/Time   COLORURINE YELLOW (A) 12/20/2022 1258   APPEARANCEUR HAZY (A) 12/20/2022 1258   LABSPEC 1.013 12/20/2022 1258   PHURINE 7.0 12/20/2022 1258   GLUCOSEU NEGATIVE 12/20/2022 1258   HGBUR NEGATIVE 12/20/2022 1258   BILIRUBINUR NEGATIVE 12/20/2022 1258   KETONESUR NEGATIVE 12/20/2022 1258   PROTEINUR >=300 (A) 12/20/2022 1258   NITRITE NEGATIVE 12/20/2022 1258   LEUKOCYTESUR NEGATIVE 12/20/2022 1258   Sepsis Labs: @LABRCNTIP (procalcitonin:4,lacticidven:4) Microbiology: Recent Results (from the past 240 hours)  Body fluid culture w Gram Stain     Status: None (Preliminary result)   Collection Time: 08/23/23 12:10 PM   Specimen: A: PATH Cytology Pleural fluid   B: PATH Cytology Pleural fluid  Result Value Ref Range Status   Specimen Description   Final    PLEURAL Performed at [facility], [address], [place], [place] [address]    Special Requests   Final    PLEURAL Performed at [facility], [address] [place], [place] [address]    Gram Stain   Final    WBC PRESENT,BOTH PMN AND MONONUCLEAR NO ORGANISMS SEEN CYTOSPIN SMEAR  Culture   Final    NO GROWTH 3 DAYS Performed at [facility], [address] [place], [place] [address]    Report Status PENDING  Incomplete      Radiology Studies last 3 days: US  LIVER DOPPLER Result Date: 08/24/2023 CLINICAL DATA:  Cirrhosis. EXAM: DUPLEX ULTRASOUND OF LIVER TECHNIQUE: Color and duplex Doppler ultrasound was performed to evaluate the hepatic in-flow and out-flow vessels. COMPARISON:  CT chest abdomen pelvis 08/20/2023 and abdominal ultrasound dated 08/06/2023 FINDINGS: Liver: Liver contour is mildly nodular. Liver parenchyma is mildly heterogeneous. No discrete liver lesion. Again noted are small echogenic foci along the gallbladder wall. These may represent small polyps and better characterized on the previous exam from 08/06/2023. Main Portal Vein size: 2.4 cm Portal Vein Velocities Main Prox:  39 cm/sec Main Mid: 37 cm/sec Main Dist:  36 cm/sec Right: 44 cm/sec Left: 43 cm/sec Hepatic Vein Velocities Right:  32 cm/sec Middle:  39 cm/sec Left:  33 cm/sec IVC: Present and patent with normal respiratory phasicity. Hepatic Artery Velocity:  41 cm/sec Splenic Vein Velocity:  32 cm/sec Spleen: 13.3 cm x 5.5 cm x 12.8 cm with a total volume of 487 cm^3 (411 cm^3 is upper limit normal) Portal Vein Occlusion/Thrombus: No Splenic Vein Occlusion/Thrombus: No Ascites: Present Varices: None Bilateral pleural effusions. Normal hepatopetal flow in the portal veins. Normal hepatofugal flow in the hepatic veins. Small amount of ascites around the spleen. IMPRESSION: 1. Patent portal vein with normal direction of flow. 2. Liver contour is mildly nodular and concerning for cirrhosis. 3. Mild splenomegaly. 4. Bilateral pleural effusions.  Small amount of ascites. Electronically Signed   By: Juliene Balder M.D.   On: 08/24/2023 10:19   US  THORACENTESIS ASP PLEURAL SPACE W/IMG GUIDE Result Date: 08/23/2023 INDICATION: Patient is a 78 y/o male with history of CAD- s/p CABG, pulmonary HTN, HTN, OSA,  type 2 diabetes mellitus, hypothyroidism, heart failure, anemia, and kidney disease. Patient presents for a diagnostic and therapeutic thoracentesis due to bilateral pleural effusions. EXAM: ULTRASOUND GUIDED right sided THORACENTESIS MEDICATIONS: 10mL lidocaine  1% COMPLICATIONS: None immediate. PROCEDURE: An ultrasound guided thoracentesis was thoroughly discussed with the patient and questions answered. The benefits, risks, alternatives and complications were also discussed. The patient understands and wishes to proceed with the procedure. Written consent was obtained. Ultrasound was performed to localize and mark an adequate pocket of fluid in the right chest. The area was then prepped and draped in the normal sterile fashion. 1% Lidocaine  was used for local anesthesia. Under ultrasound guidance a 6 Fr Safe-T-Centesis catheter was introduced. Thoracentesis was performed. The catheter was removed and a dressing applied. FINDINGS: A total of approximately 1L of amber colored fluid was removed. Samples were sent to the laboratory as requested by the clinical team. IMPRESSION: Successful ultrasound guided right thoracentesis yielding 1L of pleural fluid. Performed by Daved Hipp PA-C Electronically Signed   By: Wilkie Lent M.D.   On: 08/23/2023 13:32   DG Chest Port 1 View Result Date: 08/23/2023 CLINICAL DATA:  Pleural effusion.  Status post right thoracentesis. EXAM: PORTABLE CHEST 1 VIEW COMPARISON:  Chest radiographs 08/22/2023 FINDINGS: The cardiac silhouette remains enlarged. Aortic atherosclerosis and previous CABG are again noted. Mild pulmonary vascular congestion is similar to the prior study. There are persistent small bilateral pleural effusions with bibasilar atelectasis. No pneumothorax is identified. IMPRESSION: Persistent small bilateral pleural effusions and bibasilar atelectasis. No pneumothorax. Electronically Signed   By: Dasie Hamburg M.D.   On: 08/23/2023 13:00  ECHOCARDIOGRAM  COMPLETE Result Date: 08/23/2023    ECHOCARDIOGRAM REPORT   Patient Name:   [name] Date of Exam: 08/23/2023 Medical Rec #:  [id number]        Height:       73.0 in Accession #:    [id number]       Weight:       218.3 lb Date of Birth:  October 01, 1945         BSA:          2.233 m Patient Age:    77 years         BP:           165/73 mmHg Patient Gender: M                HR:           68 bpm. Exam Location:  [facility] Procedure: 2D Echo, Cardiac Doppler and Color Doppler Indications:     CHF-acute systolic I50.21  History:         Patient has prior history of Echocardiogram examinations, most                  recent 12/23/2022. Arrythmias:LBBB; Signs/Symptoms:Dyspnea.  Sonographer:     Christopher Furnace Referring Phys:  [id number] ELSP[address] PARAS NEWTON Diagnosing Phys: Marsa Dooms MD IMPRESSIONS  1. Left ventricular ejection fraction, by estimation, is 60 to 65%. The left ventricle has normal function. The left ventricle has no regional wall motion abnormalities. Left ventricular diastolic parameters were normal.  2. Right ventricular systolic function is normal. The right ventricular size is normal.  3. Left atrial size was mildly dilated.  4. Right atrial size was mildly dilated.  5. The mitral valve is normal in structure. Mild to moderate mitral valve regurgitation. No evidence of mitral stenosis.  6. The aortic valve is normal in structure. Aortic valve regurgitation is mild to moderate. Mild to moderate aortic valve stenosis.  7. The inferior vena cava is normal in size with greater than 50% respiratory variability, suggesting right atrial pressure of 3 mmHg. FINDINGS  Left Ventricle: Left ventricular ejection fraction, by estimation, is 60 to 65%. The left ventricle has normal function. The left ventricle has no regional wall motion abnormalities. The left ventricular internal cavity size was normal in size. There is  no left ventricular hypertrophy. Left ventricular diastolic parameters were normal. Right Ventricle: The  right ventricular size is normal. No increase in right ventricular wall thickness. Right ventricular systolic function is normal. Left Atrium: Left atrial size was mildly dilated. Right Atrium: Right atrial size was mildly dilated. Pericardium: Trivial pericardial effusion is present. Mitral Valve: The mitral valve is normal in structure. Mild to moderate mitral valve regurgitation. No evidence of mitral valve stenosis. Tricuspid Valve: The tricuspid valve is normal in structure. Tricuspid valve regurgitation is not demonstrated. No evidence of tricuspid stenosis. Aortic Valve: The aortic valve is normal in structure. Aortic valve regurgitation is mild to moderate. Mild to moderate aortic stenosis is present. Aortic valve mean gradient measures 12.7 mmHg. Aortic valve peak gradient measures 22.0 mmHg. Aortic valve  area, by VTI measures 1.57 cm. Pulmonic Valve: The pulmonic valve was normal in structure. Pulmonic valve regurgitation is not visualized. No evidence of pulmonic stenosis. Aorta: The aortic root is normal in size and structure. Venous: The inferior vena cava is normal in size with greater than 50% respiratory variability, suggesting right atrial pressure of 3 mmHg. IAS/Shunts: No atrial level shunt detected by  color flow Doppler.  LEFT VENTRICLE PLAX 2D LVIDd:         5.10 cm   Diastology LVIDs:         3.10 cm   LV e' medial:    6.09 cm/s LV PW:         0.80 cm   LV E/e' medial:  21.7 LV IVS:        1.40 cm   LV e' lateral:   13.80 cm/s LVOT diam:     2.00 cm   LV E/e' lateral: 9.6 LV SV:         73 LV SV Index:   33 LVOT Area:     3.14 cm  RIGHT VENTRICLE RV Basal diam:  4.60 cm RV Mid diam:    4.00 cm LEFT ATRIUM             Index        RIGHT ATRIUM           Index LA diam:        4.80 cm 2.15 cm/m   RA Area:     21.80 cm LA Vol (A2C):   71.1 ml 31.84 ml/m  RA Volume:   64.80 ml  29.02 ml/m LA Vol (A4C):   68.6 ml 30.72 ml/m LA Biplane Vol: 74.2 ml 33.23 ml/m  AORTIC VALVE AV Area (Vmax):     1.33 cm AV Area (Vmean):   1.38 cm AV Area (VTI):     1.57 cm AV Vmax:           234.67 cm/s AV Vmean:          163.667 cm/s AV VTI:            0.466 m AV Peak Grad:      22.0 mmHg AV Mean Grad:      12.7 mmHg LVOT Vmax:         99.00 cm/s LVOT Vmean:        71.700 cm/s LVOT VTI:          0.232 m LVOT/AV VTI ratio: 0.50  AORTA Ao Root diam: 3.40 cm MITRAL VALVE                TRICUSPID VALVE MV Area (PHT): 4.24 cm     TR Peak grad:   40.4 mmHg MV Decel Time: 179 msec     TR Vmax:        318.00 cm/s MV E velocity: 132.00 cm/s MV A velocity: 50.30 cm/s   SHUNTS MV E/A ratio:  2.62         Systemic VTI:  0.23 m                             Systemic Diam: 2.00 cm Marsa Dooms MD Electronically signed by Marsa Dooms MD Signature Date/Time: 08/23/2023/11:45:16 AM    Final       [facility] 08/26/2023, 2:30 PM    Dictation software may have been used to generate the above note. Typos may occur and escape review in typed/dictated notes. Please contact Dr Marsa directly for clarity if needed.  Staff may message me via secure chat in Epic  but this may not receive an immediate response,  please page me for urgent matters!  If 7PM-7AM, please contact night coverage [email or web address]

## 2023-08-26 NOTE — Progress Notes (Signed)
 [facility] CARDIOLOGY PROGRESS NOTE       Patient ID: [name] MRN: [id number] DOB/AGE: 12/21/1945 78 y.o.  Admit date: 08/22/2023 Referring Physician Dr. Elspeth Masters Primary Physician [name], MD  Primary Cardiologist Dr. Florencio Reason for Consultation AoCHF  HPI: [name] is a 78 y.o. male  with a past medical history of CAD s/p CABG (LIMA-LAD, SVG - LCX 100% occluded, SVG - PDA s/p PCI (2021) with prior stents, paroxysmal AF (not anticoagulated), HFpEF (severe LVH, mild AS, moderate AI, moderate to severe MR, moderate TR by echo 09/2022) DM2, CKD, OSA  who presented to the ED on 08/22/2023 for worsening shortness of breath. Cardiology was consulted for further evaluation.   Interval history: -Patient seen and examined this morning.  Feeling better overall.  -Reports his shortness of breath is much improved.  LE edema improved as well.  -Renal function trending back down this AM, up to 3.01 yesterday.  -BP and heart rate remained stable.   Review of systems complete and found to be negative unless listed above    Past Medical History:  Diagnosis Date   (HFpEF) heart failure with preserved ejection fraction (HCC) 07/25/2016   a.)TTE 07/25/16: EF >55, triv-mild pan regur, mild AS, G2DD; b.)TTE 09/02/16: EF >55, triv TR, mild AS; c.)TTE 11/05/16: EF >55, LAE, triv-mild pan regur, mild AS, G2DD; d.)TTE 01/21/20: EF >55, mod LVH, LAE/RVE, triv-mild AR/PR/TR, mod MR, mild AS, G2DD; e.)TTE 09/24/22: EF>55, sev LVH, sev LAE, RAE, mild PR, mod AR/MR/TR, mild-mod AS, RVSP 74.7; f.)TTE 12/23/22: EF 60-65, LAE, mild-mod MR/TR, mild AS   Abnormal urine 05/01/2023   Anemia    Aortic atherosclerosis (HCC)    Aortic stenosis 07/25/2016   a.) TTE 07/25/2016: mild (MPG 13.5); b.) TTE 09/02/2016: mild (MPG 16); c.) TTE 11/05/2016: mild (MPG 9); d.) TTE 01/21/2020: mild (MPG 11.7); e.) TTE 09/24/2022: mild-mod (MPG 20.3); f.) TTE 12/23/2022: mild (MPG 15)   Atrial fibrillation  (HCC)    a.) CHA2DS2VASc = 6 (age x2, CHF, HTN, vascular disease history, T2DM);  b.) rate/rhythm maintained on oral labetalol ; chronically anticoagulated with apixaban  + clopidogrel    AV block, 1st degree    B12 deficiency    CKD (chronic kidney disease), stage III (HCC)    Colon polyps    Congestive heart failure (HCC) 05/01/2023   Coronary artery disease 08/16/2016   a.) s/p 3v CABG 08/30/2016; b.) s/p PCI 11/26/2016 (DES x 1 oSVG-RCA); c.) s/p PCI 12/17/2016 (DES x 5 --> mLCx x2, dLCx, pLAD, dLAD); c.) s/p PCI 06/09/2020 (DES x1 --> ISR oSVG-PDA)   Cyst of kidney, acquired 07/03/2023   Dyspnea    Gastroesophageal reflux disease 05/01/2023   GERD (gastroesophageal reflux disease)    Heart murmur    Hepatic steatosis    History of 2019 novel coronavirus disease (COVID-19) 09/02/2020   a.) Tx'd with remdesivir    History of blood transfusion    History of GI bleed 09/02/2020   a.) admitted to [facility] 09/02/2020 - 09/08/2020   History of heart artery stent 11/26/2016   TOTAL of 7 stents: a.) 11/26/2016 --> 3.5 x 22 mm Resolute Integrity oSVG-RCA; b.) 12/17/2016: 3.0 x 12 mm Xience Alpine dLCx, 3.5 x 22 mm Resolute Onyx mLCx, 3.5 x 18 mm Resolute Onyx mLCx, 3.0 x 26 mm Resolute Onyx pLAD, 2.0 x 26 mm Resolute Onyx dLAD; c.) 06/09/2020 --> 2.5 x 18 mm Resolute Onyx ISR oSVG-PDA   History of partial colectomy 08/12/2012   a.) large  gastric hyperplastic polyp removal   HLD (hyperlipidemia)    Hypertension    LBBB (left bundle branch block)    Long term current use of anticoagulant    a.) apixaban    Long term current use of antithrombotics/antiplatelets    a.) clopidogrel    Male hypogonadism    a.) on exogenous TRT (1.62% gel)   Mild cardiomegaly    Mild gynecomastia (bilateral)    Nephrolithiasis    OSA (obstructive sleep apnea)    a.) does not utilize nocturnal PAP therapy   Personal history of surgery to heart and great vessels, presenting hazards to health 05/01/2023   Proteinuria,  unspecified 08/01/2023   Pulmonary hypertension (HCC) 09/24/2022   a.) TTE 09/24/2022: RVSP 74.7; b.) TTE 12/23/2022: RVSP 31.3   RBBB (right bundle branch block with left anterior fascicular block)    Right inguinal hernia    Right thyroid  nodule    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   a.) LIMA-LAD, SVG-PDA, SVG-OM3   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/01/2023   Type 2 diabetes mellitus treated with insulin  (HCC)    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   Umbilical hernia    Unstable angina (HCC)    Vitamin D  deficiency     Past Surgical History:  Procedure Laterality Date   APPENDECTOMY     CARDIAC CATHETERIZATION Left 08/16/2016   Procedure: Left Heart Cath and Coronary Angiography;  Surgeon: Cara JONETTA Lovelace, MD;  Location: [facility]ASIVE CV LAB;  Service: Cardiovascular;  Laterality: Left;   COLONOSCOPY N/A 09/07/2020   Procedure: COLONOSCOPY;  Surgeon: Janalyn Keene NOVAK, MD;  Location: [facility] ENDOSCOPY;  Service: Endoscopy;  Laterality: N/A;   COLONOSCOPY WITH PROPOFOL  N/A 05/17/2021   Procedure: COLONOSCOPY WITH PROPOFOL ;  Surgeon: Janalyn Keene NOVAK, MD;  Location: [facility] ENDOSCOPY;  Service: Endoscopy;  Laterality: N/A;   CORONARY ANGIOPLASTY WITH STENT PLACEMENT Left 11/26/2016   Procedure: CORONARY ANGIOPLASTY WITH STENT PLACEMENT; Location: [facility]; Surgeon: Alm Dais, MD   CORONARY ARTERY BYPASS GRAFT N/A 08/30/2016   Procedure: CORONARY ARTERY BYPASS GRAFT; Location: [facility]; Surgeon: Juliene Pouch, MD   CORONARY STENT INTERVENTION N/A 06/09/2020   Procedure: CORONARY STENT INTERVENTION;  Surgeon: Lovelace Cara JONETTA, MD;  Location: [facility];  Service: Cardiovascular;  Laterality: N/A;   CORONARY STENT INTERVENTION Left 12/17/2016   Procedure: CORONARY STENT INTERVENTION; Location: [facility]; Surgeon: Alm Dais, MD   ENDOSCOPIC MUCOSAL RESECTION N/A 09/22/2020   Procedure: ENDOSCOPIC MUCOSAL RESECTION;  Surgeon: [name] Aloha Raddle., MD;  Location: [facility] ENDOSCOPY;  Service: Gastroenterology;  Laterality: N/A;   ESOPHAGOGASTRODUODENOSCOPY N/A 09/07/2020   Procedure: ESOPHAGOGASTRODUODENOSCOPY (EGD);  Surgeon: Janalyn Keene NOVAK, MD;  Location: [facility] ENDOSCOPY;  Service: Endoscopy;  Laterality: N/A;   ESOPHAGOGASTRODUODENOSCOPY (EGD) WITH PROPOFOL  N/A 09/22/2020   Procedure: ESOPHAGOGASTRODUODENOSCOPY (EGD) WITH PROPOFOL ;  Surgeon: [name] Aloha Raddle., MD;  Location: [facility] ENDOSCOPY;  Service: Gastroenterology;  Laterality: N/A;   FRACTURE SURGERY     HEMOSTASIS CLIP PLACEMENT  09/22/2020   Procedure: HEMOSTASIS CLIP PLACEMENT;  Surgeon: [name] Aloha Raddle., MD;  Location: [facility] ENDOSCOPY;  Service: Gastroenterology;;   HEMOSTASIS CONTROL  09/22/2020   Procedure: HEMOSTASIS CONTROL;  Surgeon: [name] Aloha Raddle., MD;  Location: [facility] ENDOSCOPY;  Service: Gastroenterology;;   INSERTION OF MESH  02/19/2023   Procedure: INSERTION OF MESH;  Surgeon: Desiderio Schanz, MD;  Location: [facility] ORS;  Service: General;;  umbilical   LAPAROSCOPIC PARTIAL RIGHT COLECTOMY Right 08/12/2012   Procedure: LAPAROSCOPIC PARTIAL RIGHT COLECTOMY;  Location: [facility]; Surgeon: Unknown Sharps, MD   LEFT HEART CATH AND CORONARY ANGIOGRAPHY Left 06/09/2020   Procedure: LEFT HEART CATH AND CORONARY ANGIOGRAPHY;  Surgeon: Florencio Cara BIRCH, MD;  Location: [facility];  Service: Cardiovascular;  Laterality: Left;   LEFT HEART CATH AND CORS/GRAFTS ANGIOGRAPHY Left 11/20/2016   Procedure: Left Heart Cath and Cors/Grafts Angiography;  Surgeon: Cara BIRCH Florencio, MD;  Location: [facility];  Service: Cardiovascular;  Laterality: Left;   POLYPECTOMY  09/22/2020   Procedure: POLYPECTOMY;  Surgeon: Mansouraty, Aloha Raddle., MD;  Location: [facility] ENDOSCOPY;  Service: Gastroenterology;;   SUBMUCOSAL LIFTING INJECTION  09/22/2020   Procedure: SUBMUCOSAL LIFTING INJECTION;  Surgeon: [name] Aloha Raddle., MD;  Location: [facility] ENDOSCOPY;  Service:  Gastroenterology;;   UMBILICAL HERNIA REPAIR N/A 02/19/2023   Procedure: HERNIA REPAIR UMBILICAL ADULT, open;  Surgeon: Desiderio Schanz, MD;  Location: [facility] ORS;  Service: General;  Laterality: N/A;   XI ROBOTIC ASSISTED INGUINAL HERNIA REPAIR WITH MESH Right 02/19/2023   Procedure: XI ROBOTIC ASSISTED INGUINAL HERNIA REPAIR WITH MESH;  Surgeon: Desiderio Schanz, MD;  Location: [facility] ORS;  Service: General;  Laterality: Right;    Medications Prior to Admission  Medication Sig Dispense Refill Last Dose/Taking   acetaminophen  (TYLENOL ) 500 MG tablet Take 2 tablets (1,000 mg total) by mouth every 6 (six) hours as needed for mild pain.   Taking As Needed   amLODipine  (NORVASC ) 5 MG tablet Take 5 mg by mouth 2 (two) times daily.   08/22/2023   apixaban  (ELIQUIS ) 5 MG TABS tablet Take 1 tablet (5 mg total) by mouth 2 (two) times daily. 60 tablet 0 08/22/2023 Morning   clopidogrel  (PLAVIX ) 75 MG tablet Take 75 mg by mouth daily.   08/22/2023   cyanocobalamin  (VITAMIN B12) 1000 MCG tablet Take 1,000 mcg by mouth daily.   08/22/2023   dexlansoprazole (DEXILANT) 60 MG capsule Take 1 capsule by mouth daily.   08/22/2023   Ferrous Sulfate  (IRON ) 325 (65 Fe) MG TABS Take 1 tablet by mouth daily.   08/22/2023   hydrALAZINE  (APRESOLINE ) 50 MG tablet Take 1 tablet by mouth 3 (three) times daily.   08/22/2023   labetalol  (NORMODYNE ) 100 MG tablet Take 1 tablet by mouth 2 (two) times daily.   08/22/2023   metFORMIN (GLUCOPHAGE-XR) 500 MG 24 hr tablet Take 500 mg by mouth 2 (two) times daily.   08/22/2023   nitroGLYCERIN  (NITROSTAT ) 0.4 MG SL tablet Place 0.4 mg under the tongue every 5 (five) minutes as needed.   Taking As Needed   rosuvastatin  (CRESTOR ) 40 MG tablet Take 40 mg by mouth daily.   08/22/2023   sitaGLIPtin (JANUVIA) 100 MG tablet Take 100 mg by mouth daily.   08/22/2023   Testosterone  1.62 % GEL Apply 2 Pump topically daily. One pump on each arm 225 g 3 08/22/2023   torsemide  (DEMADEX ) 20 MG tablet Take 1 tablet (20 mg total) by  mouth 2 (two) times daily.   08/22/2023   Social History   Socioeconomic History   Marital status: Married    Spouse name: Not on file   Number of children: Not on file   Years of education: Not on file   Highest education level: Not on file  Occupational History   Not on file  Tobacco Use   Smoking status: Former    Current packs/day: 0.00    Types: Cigarettes    Quit date: 2000    Years since quitting: 25.0    Passive  exposure: Never   Smokeless tobacco: Never  Vaping Use   Vaping status: Never Used  Substance and Sexual Activity   Alcohol use: No   Drug use: No   Sexual activity: Not Currently  Other Topics Concern   Not on file  Social History Narrative   Not on file   Social Drivers of Health   Financial Resource Strain: Not on file  Food Insecurity: No Food Insecurity (08/23/2023)   Hunger Vital Sign    Worried About Running Out of Food in the Last Year: Never true    Ran Out of Food in the Last Year: Never true  Transportation Needs: No Transportation Needs (08/23/2023)   PRAPARE - Administrator, Civil Service (Medical): No    Lack of Transportation (Non-Medical): No  Physical Activity: Not on file  Stress: Not on file  Social Connections: Moderately Isolated (08/23/2023)   Social Connection and Isolation Panel [NHANES]    Frequency of Communication with Friends and Family: More than three times a week    Frequency of Social Gatherings with Friends and Family: Three times a week    Attends Religious Services: Never    Active Member of Clubs or Organizations: No    Attends Banker Meetings: Never    Marital Status: Married  Catering Manager Violence: Not At Risk (08/23/2023)   Humiliation, Afraid, Rape, and Kick questionnaire    Fear of Current or Ex-Partner: No    Emotionally Abused: No    Physically Abused: No    Sexually Abused: No    History reviewed. No pertinent family history.   Vitals:   08/25/23 2308 08/26/23 0500  08/26/23 0602 08/26/23 0730  BP:   (!) 159/86 (!) 162/86  Pulse: (!) 58  (!) 52 (!) 55  Resp:   19 18  Temp:   97.9 F (36.6 C) 97.8 F (36.6 C)  TempSrc:   Oral Oral  SpO2:   96% 96%  Weight:  93.6 kg    Height:        PHYSICAL EXAM General: Chronically ill-appearing male, well nourished, in no acute distress. HEENT: Normocephalic and atraumatic. Neck: No JVD.  Lungs: Normal respiratory effort on room air.  Breath sounds diminished bilaterally Heart: HRRR. Normal S1 and S2 without gallops or murmurs.  Abdomen: Non-distended appearing.  Msk: Normal strength and tone for age. Extremities: Warm and well perfused. No clubbing, cyanosis.  1+ edema, tense woody appearing lower extremities.  Neuro: Alert and oriented X 3. Psych: Answers questions appropriately.   Labs: Basic Metabolic Panel: Recent Labs    08/25/23 0814 08/26/23 0440  NA 137 139  K 3.3* 4.1  CL 103 103  CO2 25 26  GLUCOSE 135* 141*  BUN 42* 43*  CREATININE 3.01* 2.79*  CALCIUM  9.7 9.6   Liver Function Tests: No results for input(s): AST, ALT, ALKPHOS, BILITOT, PROT, ALBUMIN in the last 72 hours.  No results for input(s): LIPASE, AMYLASE in the last 72 hours. CBC: Recent Labs    08/25/23 0547 08/26/23 0440  WBC 4.6 5.1  HGB 7.3* 8.3*  HCT 22.8* 25.2*  MCV 102.7* 100.4*  PLT 91* 89*   Cardiac Enzymes: No results for input(s): CKTOTAL, CKMB, CKMBINDEX, TROPONINIHS in the last 72 hours.  BNP: No results for input(s): BNP in the last 72 hours.  D-Dimer: No results for input(s): DDIMER in the last 72 hours. Hemoglobin A1C: No results for input(s): HGBA1C in the last 72 hours. Fasting Lipid Panel:  No results for input(s): CHOL, HDL, LDLCALC, TRIG, CHOLHDL, LDLDIRECT in the last 72 hours. Thyroid  Function Tests: No results for input(s): TSH, T4TOTAL, T3FREE, THYROIDAB in the last 72 hours.  Invalid input(s): FREET3 Anemia Panel: No results  for input(s): VITAMINB12, FOLATE, FERRITIN, TIBC, IRON , RETICCTPCT in the last 72 hours.    Radiology: US  LIVER DOPPLER Result Date: 08/24/2023 CLINICAL DATA:  Cirrhosis. EXAM: DUPLEX ULTRASOUND OF LIVER TECHNIQUE: Color and duplex Doppler ultrasound was performed to evaluate the hepatic in-flow and out-flow vessels. COMPARISON:  CT chest abdomen pelvis 08/20/2023 and abdominal ultrasound dated 08/06/2023 FINDINGS: Liver: Liver contour is mildly nodular. Liver parenchyma is mildly heterogeneous. No discrete liver lesion. Again noted are small echogenic foci along the gallbladder wall. These may represent small polyps and better characterized on the previous exam from 08/06/2023. Main Portal Vein size: 2.4 cm Portal Vein Velocities Main Prox:  39 cm/sec Main Mid: 37 cm/sec Main Dist:  36 cm/sec Right: 44 cm/sec Left: 43 cm/sec Hepatic Vein Velocities Right:  32 cm/sec Middle:  39 cm/sec Left:  33 cm/sec IVC: Present and patent with normal respiratory phasicity. Hepatic Artery Velocity:  41 cm/sec Splenic Vein Velocity:  32 cm/sec Spleen: 13.3 cm x 5.5 cm x 12.8 cm with a total volume of 487 cm^3 (411 cm^3 is upper limit normal) Portal Vein Occlusion/Thrombus: No Splenic Vein Occlusion/Thrombus: No Ascites: Present Varices: None Bilateral pleural effusions. Normal hepatopetal flow in the portal veins. Normal hepatofugal flow in the hepatic veins. Small amount of ascites around the spleen. IMPRESSION: 1. Patent portal vein with normal direction of flow. 2. Liver contour is mildly nodular and concerning for cirrhosis. 3. Mild splenomegaly. 4. Bilateral pleural effusions.  Small amount of ascites. Electronically Signed   By: Juliene Balder M.D.   On: 08/24/2023 10:19   US  THORACENTESIS ASP PLEURAL SPACE W/IMG GUIDE Result Date: 08/23/2023 INDICATION: Patient is a 78 y/o male with history of CAD- s/p CABG, pulmonary HTN, HTN, OSA, type 2 diabetes mellitus, hypothyroidism, heart failure, anemia, and kidney  disease. Patient presents for a diagnostic and therapeutic thoracentesis due to bilateral pleural effusions. EXAM: ULTRASOUND GUIDED right sided THORACENTESIS MEDICATIONS: 10mL lidocaine  1% COMPLICATIONS: None immediate. PROCEDURE: An ultrasound guided thoracentesis was thoroughly discussed with the patient and questions answered. The benefits, risks, alternatives and complications were also discussed. The patient understands and wishes to proceed with the procedure. Written consent was obtained. Ultrasound was performed to localize and mark an adequate pocket of fluid in the right chest. The area was then prepped and draped in the normal sterile fashion. 1% Lidocaine  was used for local anesthesia. Under ultrasound guidance a 6 Fr Safe-T-Centesis catheter was introduced. Thoracentesis was performed. The catheter was removed and a dressing applied. FINDINGS: A total of approximately 1L of amber colored fluid was removed. Samples were sent to the laboratory as requested by the clinical team. IMPRESSION: Successful ultrasound guided right thoracentesis yielding 1L of pleural fluid. Performed by Daved Hipp PA-C Electronically Signed   By: Wilkie Lent M.D.   On: 08/23/2023 13:32   DG Chest Port 1 View Result Date: 08/23/2023 CLINICAL DATA:  Pleural effusion.  Status post right thoracentesis. EXAM: PORTABLE CHEST 1 VIEW COMPARISON:  Chest radiographs 08/22/2023 FINDINGS: The cardiac silhouette remains enlarged. Aortic atherosclerosis and previous CABG are again noted. Mild pulmonary vascular congestion is similar to the prior study. There are persistent small bilateral pleural effusions with bibasilar atelectasis. No pneumothorax is identified. IMPRESSION: Persistent small bilateral pleural effusions and bibasilar atelectasis. No  pneumothorax. Electronically Signed   By: Dasie Hamburg M.D.   On: 08/23/2023 13:00   ECHOCARDIOGRAM COMPLETE Result Date: 08/23/2023    ECHOCARDIOGRAM REPORT   Patient Name:    [name] Date of Exam: 08/23/2023 Medical Rec #:  [id number]        Height:       73.0 in Accession #:    [id number]       Weight:       218.3 lb Date of Birth:  30-Jun-1946         BSA:          2.233 m Patient Age:    77 years         BP:           165/73 mmHg Patient Gender: M                HR:           68 bpm. Exam Location:  [facility] Procedure: 2D Echo, Cardiac Doppler and Color Doppler Indications:     CHF-acute systolic I50.21  History:         Patient has prior history of Echocardiogram examinations, most                  recent 12/23/2022. Arrythmias:LBBB; Signs/Symptoms:Dyspnea.  Sonographer:     Christopher Furnace Referring Phys:  [id number] ELSPETH PARAS NEWTON Diagnosing Phys: Marsa Dooms MD IMPRESSIONS  1. Left ventricular ejection fraction, by estimation, is 60 to 65%. The left ventricle has normal function. The left ventricle has no regional wall motion abnormalities. Left ventricular diastolic parameters were normal.  2. Right ventricular systolic function is normal. The right ventricular size is normal.  3. Left atrial size was mildly dilated.  4. Right atrial size was mildly dilated.  5. The mitral valve is normal in structure. Mild to moderate mitral valve regurgitation. No evidence of mitral stenosis.  6. The aortic valve is normal in structure. Aortic valve regurgitation is mild to moderate. Mild to moderate aortic valve stenosis.  7. The inferior vena cava is normal in size with greater than 50% respiratory variability, suggesting right atrial pressure of 3 mmHg. FINDINGS  Left Ventricle: Left ventricular ejection fraction, by estimation, is 60 to 65%. The left ventricle has normal function. The left ventricle has no regional wall motion abnormalities. The left ventricular internal cavity size was normal in size. There is  no left ventricular hypertrophy. Left ventricular diastolic parameters were normal. Right Ventricle: The right ventricular size is normal. No increase in right ventricular wall  thickness. Right ventricular systolic function is normal. Left Atrium: Left atrial size was mildly dilated. Right Atrium: Right atrial size was mildly dilated. Pericardium: Trivial pericardial effusion is present. Mitral Valve: The mitral valve is normal in structure. Mild to moderate mitral valve regurgitation. No evidence of mitral valve stenosis. Tricuspid Valve: The tricuspid valve is normal in structure. Tricuspid valve regurgitation is not demonstrated. No evidence of tricuspid stenosis. Aortic Valve: The aortic valve is normal in structure. Aortic valve regurgitation is mild to moderate. Mild to moderate aortic stenosis is present. Aortic valve mean gradient measures 12.7 mmHg. Aortic valve peak gradient measures 22.0 mmHg. Aortic valve  area, by VTI measures 1.57 cm. Pulmonic Valve: The pulmonic valve was normal in structure. Pulmonic valve regurgitation is not visualized. No evidence of pulmonic stenosis. Aorta: The aortic root is normal in size and structure. Venous: The inferior vena cava is normal in size with greater than  50% respiratory variability, suggesting right atrial pressure of 3 mmHg. IAS/Shunts: No atrial level shunt detected by color flow Doppler.  LEFT VENTRICLE PLAX 2D LVIDd:         5.10 cm   Diastology LVIDs:         3.10 cm   LV e' medial:    6.09 cm/s LV PW:         0.80 cm   LV E/e' medial:  21.7 LV IVS:        1.40 cm   LV e' lateral:   13.80 cm/s LVOT diam:     2.00 cm   LV E/e' lateral: 9.6 LV SV:         73 LV SV Index:   33 LVOT Area:     3.14 cm  RIGHT VENTRICLE RV Basal diam:  4.60 cm RV Mid diam:    4.00 cm LEFT ATRIUM             Index        RIGHT ATRIUM           Index LA diam:        4.80 cm 2.15 cm/m   RA Area:     21.80 cm LA Vol (A2C):   71.1 ml 31.84 ml/m  RA Volume:   64.80 ml  29.02 ml/m LA Vol (A4C):   68.6 ml 30.72 ml/m LA Biplane Vol: 74.2 ml 33.23 ml/m  AORTIC VALVE AV Area (Vmax):    1.33 cm AV Area (Vmean):   1.38 cm AV Area (VTI):     1.57 cm AV  Vmax:           234.67 cm/s AV Vmean:          163.667 cm/s AV VTI:            0.466 m AV Peak Grad:      22.0 mmHg AV Mean Grad:      12.7 mmHg LVOT Vmax:         99.00 cm/s LVOT Vmean:        71.700 cm/s LVOT VTI:          0.232 m LVOT/AV VTI ratio: 0.50  AORTA Ao Root diam: 3.40 cm MITRAL VALVE                TRICUSPID VALVE MV Area (PHT): 4.24 cm     TR Peak grad:   40.4 mmHg MV Decel Time: 179 msec     TR Vmax:        318.00 cm/s MV E velocity: 132.00 cm/s MV A velocity: 50.30 cm/s   SHUNTS MV E/A ratio:  2.62         Systemic VTI:  0.23 m                             Systemic Diam: 2.00 cm Marsa Dooms MD Electronically signed by Marsa Dooms MD Signature Date/Time: 08/23/2023/11:45:16 AM    Final    DG Chest 2 View Result Date: 08/22/2023 CLINICAL DATA:  78 year old male with shortness of breath, fatigue. EXAM: CHEST - 2 VIEW COMPARISON:  CT Chest, Abdomen, and Pelvis 08/20/2023 and earlier. FINDINGS: PA and lateral views 0743 hours today. Stable cardiomegaly and mediastinal contours. Prior CABG. Ongoing bilateral pleural effusions, moderate and slightly larger on the right. No pneumothorax. Bilateral pulmonary vascular congestion is relatively mild, mild interstitial edema is possible. No air bronchograms. Overall ventilation  not significantly changed from recent CT. No acute osseous abnormality identified. Negative visible bowel gas. IMPRESSION: Bilateral pleural effusions and interstitial edema. Ventilation not significantly changed from CT two days ago. Electronically Signed   By: VEAR Hurst M.D.   On: 08/22/2023 07:59   CT CHEST ABDOMEN PELVIS WO CONTRAST Result Date: 08/20/2023 CLINICAL DATA:  Follow-up pleural effusions and abdominal ascites. EXAM: CT CHEST, ABDOMEN AND PELVIS WITHOUT CONTRAST TECHNIQUE: Multidetector CT imaging of the chest, abdomen and pelvis was performed following the standard protocol without IV contrast. RADIATION DOSE REDUCTION: This exam was performed according to  the departmental dose-optimization program which includes automated exposure control, adjustment of the mA and/or kV according to patient size and/or use of iterative reconstruction technique. COMPARISON:  Abdominal ultrasound 08/06/2023 FINDINGS: CT CHEST FINDINGS Cardiovascular: The heart is mildly enlarged. The aorta is normal in caliber. Scattered atherosclerotic calcifications. Three-vessel coronary artery calcifications and surgical changes from prior coronary artery bypass surgery. Mediastinum/Nodes: Several enlarged mediastinal and hilar lymph nodes which could be related to the patient's lung findings and pleural effusions. The esophagus is grossly normal. There is a 2.3 cm right thyroid  lobe lesion. This has been evaluated on previous imaging. (ref: J Am Coll Radiol. 2015 Feb;12(2): 143-50). Lungs/Pleura: Moderate-sized left pleural effusion and large right pleural effusion. Overlying atelectasis and moderate fluid in the left major fissure. No pulmonary edema. No worrisome pulmonary lesions. Musculoskeletal: Advanced degenerative changes involving the thoracic spine. Median sternotomy wires related to prior heart surgery. CT ABDOMEN PELVIS FINDINGS Hepatobiliary: Suspect cirrhotic changes involving the liver with irregular hepatic contour, dilated hepatic fissures and increased caudate to right lobe ratio. No hepatic lesions are identified without contrast peer small calcified granuloma noted in the right lobe. The gallbladder is moderately contracted. There is associated gallbladder wall thickening and pericholecystic fluid likely due to the patient's ascites. No biliary dilatation. Pancreas: No mass, inflammation or ductal dilatation. Spleen: Within normal limits in size.  No splenic lesions. Adrenals/Urinary Tract: The adrenal glands are normal. Simple bilateral renal cysts and bilateral renal calculi not requiring any further imaging evaluation or follow-up. The bladder is unremarkable. Stomach/Bowel:  The stomach, duodenum, small bowel and colon grossly normal. Evidence of prior colonic surgery. Vascular/Lymphatic: Advanced atherosclerotic calcification involving the aorta, iliac arteries and branch vessels but no aneurysm. Borderline mesenteric and retroperitoneal lymph nodes likely related to the patient's ascites and cirrhosis. Reproductive: The prostate gland and seminal vesicles are unremarkable. Other: Small/moderate volume abdominal/pelvic ascites. Musculoskeletal: No significant bony findings. Advanced degenerative changes involving the spine and moderate bilateral hip joint degenerative changes. IMPRESSION: 1. Moderate-sized left pleural effusion and large right pleural effusion. Overlying atelectasis and moderate fluid in the left major fissure. 2. Several enlarged mediastinal and hilar lymph nodes which could be related to the patient's lung findings and pleural effusions. 3. Cirrhotic changes involving the liver. No hepatic lesions are identified without contrast. 4. Small/moderate volume abdominal/pelvic ascites. 5. Gallbladder wall thickening and pericholecystic fluid likely due to the patient's ascites and cirrhosis. 6. Advanced atherosclerotic calcification.  No aneurysm. Aortic Atherosclerosis (ICD10-I70.0). Electronically Signed   By: MYRTIS Stammer M.D.   On: 08/20/2023 14:48   US  ABDOMEN LIMITED RUQ (LIVER/GB) Result Date: 08/06/2023 CLINICAL DATA:  Evaluate for ascites.  Elevated LFTs. EXAM: ULTRASOUND ABDOMEN LIMITED RIGHT UPPER QUADRANT COMPARISON:  Ultrasound abdomen 09/19/2022 FINDINGS: Gallbladder: Multiple gallbladder polyps measuring up to 8 mm. Small stones in the gallbladder lumen. No gallbladder wall thickening or pericholecystic fluid. Common bile duct: Diameter: 3.1 mm Liver:  Mild coarsened hepatic parenchymal echogenicity. No focal lesion. Portal vein is patent on color Doppler imaging with normal direction of blood flow towards the liver. Other: Bilateral pleural effusions.  Upper abdominal ascites. 2.1 cm cyst exophytic superior pole right kidney. No imaging follow-up needed. IMPRESSION: 1. Upper abdominal ascites. 2. Bilateral pleural effusions. 3. Cholelithiasis without secondary signs of acute cholecystitis. 4. Multiple gallbladder polyps measuring up to 8 mm. Recommend follow-up ultrasound in 6 months. Electronically Signed   By: Bard Moats M.D.   On: 08/06/2023 09:06    ECHO as above  TELEMETRY reviewed by me 08/26/2023: Not on telemetry  EKG reviewed by me: Atrial fibrillation rate 59 bpm  Data reviewed by me 08/26/2023: last 24h vitals tele labs imaging I/O hospitalist progress note  Principal Problem:   CHF exacerbation (HCC) Active Problems:   Essential hypertension   OSA (obstructive sleep apnea)   CAD (coronary artery disease), native coronary artery   Hyperlipidemia associated with type 2 diabetes mellitus (HCC)   Acute blood loss anemia   Acute heart failure with preserved ejection fraction (HFpEF) (HCC)   Paroxysmal atrial fibrillation (HCC)   Acute kidney injury superimposed on chronic kidney disease (HCC)   Upper GI bleeding   Pleural effusion   Symptomatic anemia   Cirrhosis of liver with ascites (HCC)    ASSESSMENT AND PLAN:  [name] is a 78 y.o. male  with a past medical history of CAD s/p CABG (LIMA-LAD, SVG - LCX 100% occluded, SVG - PDA s/p PCI (2021) with prior stents, paroxysmal AF (not anticoagulated), HFpEF (severe LVH, mild AS, moderate AI, moderate to severe MR, moderate TR by echo 09/2022) DM2, CKD, OSA  who presented to the ED on 08/22/2023 for worsening shortness of breath. Cardiology was consulted for further evaluation.   # Acute on chronic HFpEF # Coronary artery disease Presenting with worsening shortness of breath over the last few weeks.  Also noticed increased weight and lower extremity edema.  BNP elevated at 1069.  Troponins trended 23 > 24.  CXR with bilateral pleural effusions and interstitial edema. S/p  thoracentesis with 1L removed. Echo this admission with EF 60-65%, no WMAs, mild to moderate AS. Volume status appears improved. -Continue to hold diuresis, consider resuming po when renal function improved. -Continue labetalol  100 mg twice daily.  -Continue rosuvastatin  40 mg daily.   -No plan for further cardiac diagnostics.  # Upper GI bleed Patient recently undergoing workup outpatient for upper GI bleed.  Planning to have EGD outpatient.  Hemoglobin down to 6.9 today. -S/p transfusion 1/9. -GI following, plan for EGD today.  # Paroxysmal atrial fibrillation  Patient with a history of paroxysmal atrial fibrillation on labetalol  at home for rate control as well as Eliquis .  Denies any palpitation symptoms. -Continue home labetalol  as above. -Eliquis  held given GIB.    This patient's plan of care was discussed and created with Dr. Wilburn and he is in agreement.  Signed: Danita Bloch, PA-C  08/26/2023, 9:48 AM [facility]

## 2023-08-26 NOTE — Progress Notes (Signed)
 Heart Failure Stewardship Pharmacy Note  PCP: [name], MD PCP-Cardiologist: None  HPI: [name] is a 78 y.o. male with CAD s/p CABG (LIMA-LAD, SVG - LCX 100% occluded, SVG - PDA s/p PCI (2021) with prior stents, paroxysmal AF (not anticoagulated), HFpEF (severe LVH, mild AS, moderate AI, moderate to severe MR, moderate TR by echo 09/2022) DM2, CKD, OSA who presented with shortness of breath, dyspnea on exertion, and anasarca that has progressed over the past 2-3 months. Reports symptoms began months ago after stopping torsemide . Diuretics were restarted every other day at his cardiology visit, then recently increased on 08/20/23. BNP on admission was 1069.2. HS troponin on admission was 23 >24. Iron  labs meet HF replacement criteria and he has infusions scheduled for the next 2 Mondays. CXR showed bilateral pleural effusions, pulmonary vascular congestion, and pulmonary edema. Upper abdominal ascites present on abdominal ultrasound. Thoracentesis yielded 1L. GI planning EGD to evaluate upper GI bleed.  Pertinent Lab Values: Creatinine  Date Value Ref Range Status  08/04/2012 1.03 0.60 - 1.30 mg/dL Final   Creatinine, Ser  Date Value Ref Range Status  08/26/2023 2.79 (H) 0.61 - 1.24 mg/dL Final   BUN  Date Value Ref Range Status  08/26/2023 43 (H) 8 - 23 mg/dL Final  87/76/7986 17 7 - 18 mg/dL Final   Potassium  Date Value Ref Range Status  08/26/2023 4.1 3.5 - 5.1 mmol/L Final  08/04/2012 4.1 3.5 - 5.1 mmol/L Final   Sodium  Date Value Ref Range Status  08/26/2023 139 135 - 145 mmol/L Final  08/04/2012 139 136 - 145 mmol/L Final   B Natriuretic Peptide  Date Value Ref Range Status  08/22/2023 1,069.2 (H) 0.0 - 100.0 pg/mL Final    Comment:    Performed at [facility], [address] [place], [place] [address]   Magnesium   Date Value Ref Range Status  12/27/2022 2.3 1.7 - 2.4 mg/dL Final    Comment:    Performed at [facility], [address] [place], [place] [address]   Hgb A1c MFr Bld  Date Value Ref Range Status  12/21/2022 5.8 (H) 4.8 - 5.6 % Final    Comment:    (NOTE) Pre diabetes:          5.7%-6.4%  Diabetes:              >6.4%  Glycemic control for   <7.0% adults with diabetes    TSH  Date Value Ref Range Status  09/03/2020 3.235 0.350 - 4.500 uIU/mL Final    Comment:    Performed by a 3rd Generation assay with a functional sensitivity of <=0.01 uIU/mL. Performed at [facility], [address] [place], [place] [address]     Vital Signs: Admission weight: 218.2 lbs Temp:  [97.7 F (36.5 C)-98.2 F (36.8 C)] 97.8 F (36.6 C) (01/13 0730) Pulse Rate:  [52-64] 55 (01/13 0730) Resp:  [15-20] 18 (01/13 0730) BP: (119-162)/(58-86) 162/86 (01/13 0730) SpO2:  [94 %-97 %] 96 % (01/13 0730) Weight:  [93.6 kg (206 lb 6.4 oz)] 93.6 kg (206 lb 6.4 oz) (01/13 0500)  Intake/Output Summary (Last 24 hours) at 08/26/2023 0738 Last data filed at 08/25/2023 2056 Gross per 24 hour  Intake 100 ml  Output --  Net 100 ml    Current Heart Failure Medications:  Loop diuretic: furosemide  60 mg IV BID Beta-Blocker: labetalol  100 mg BID ACEI/ARB/ARNI: none MRA: none SGLT2i: none   Prior to admission  Heart Failure Medications:  Loop diuretic: torsemide  20 mg daily (recently increased) Beta-Blocker: labetalol  100 mg BID ACEI/ARB/ARNI: none MRA: none SGLT2i: none Other vasoactive medications include amlodipine  10 mg daily  Assessment: 1. Acute on chronic diastolic heart failure (LVEF in 12/2022 60-65%) and new echo pending, due to ICM vs mixed etiology. NYHA class IV symptoms.  -Symptoms: Reports shortness of breath, DOE, and LEE have all improved. Able to walk to the bathroom without shortness of breath today. Feeling much improved since admission. -Volume: Remains with significant LEE on exam, however  -Hemodynamics: BP is elevated ~150s/80s. HR is stable -SGLT2i: If creatinine begins to trend down  this weekend, consider adding SGLT2i as first line treatment of HFpEF, CKD, and DM. Jardiance  has slightly better data with lower eGFRs. -MRA: Will not likely be a candidate for MRAs due to renal function. Could consider Kerendia if creatinine improved given indication for diabetic nephropathy treatment. -ARNI: Can consider Entresto given hypertension if creatinine stabilizes <2 due for prevention of CKD progression in DM and HFpEF. Not currently a candidate.  -BB is indicated on the basis of CAD, however diastolic HF does not tolerate low HR well. Can consider reducing labetalol  and adding afterload reduction  Plan: 1) Medication changes recommended at this time: -Consider starting hydralazine  50 mg TID for HTN -Consider reducing labetalol  to 50 mg BID -Can consider starting Jardiance  10 mg daily after GIB evaluation  2) Patient assistance: -Eliquis  copay is $86.93, Xarelto copay is $86.93, Entresto copay is $86.93, Doreen copay is $86.93, Jardiance  copay is $86.93. Copays high due to a $25.00 deductible   3) Education: - Patient has been educated on current HF medications and potential additions to HF medication regimen - Patient verbalizes understanding that over the next few months, these medication doses may change and more medications may be added to optimize HF regimen - Patient has been educated on basic disease state pathophysiology and goals of therapy  Medication Assistance / Insurance Benefits Check: Does the patient have prescription insurance?    Type of insurance plan:  Does the patient qualify for medication assistance through manufacturers or grants? Pending  Eligible grants and/or patient assistance programs: pending  Medication assistance applications in progress: pending  Medication assistance applications approved: pending Approved medication assistance renewals will be completed by: pending.  Outpatient Pharmacy: Prior to admission outpatient pharmacy: [facility]      Please do not hesitate to reach out with questions or concerns,  Jaun Bash, PharmD, CPP, BCPS Heart Failure Pharmacist  Phone - [phone] 08/26/2023 7:38 AM

## 2023-08-27 DIAGNOSIS — J90 Pleural effusion, not elsewhere classified: Secondary | ICD-10-CM | POA: Diagnosis not present

## 2023-08-27 DIAGNOSIS — D62 Acute posthemorrhagic anemia: Secondary | ICD-10-CM | POA: Diagnosis not present

## 2023-08-27 DIAGNOSIS — I480 Paroxysmal atrial fibrillation: Secondary | ICD-10-CM | POA: Diagnosis not present

## 2023-08-27 DIAGNOSIS — D649 Anemia, unspecified: Secondary | ICD-10-CM | POA: Diagnosis not present

## 2023-08-27 LAB — CBC
HCT: 24.5 % — ABNORMAL LOW (ref 39.0–52.0)
Hemoglobin: 7.9 g/dL — ABNORMAL LOW (ref 13.0–17.0)
MCH: 32.5 pg (ref 26.0–34.0)
MCHC: 32.2 g/dL (ref 30.0–36.0)
MCV: 100.8 fL — ABNORMAL HIGH (ref 80.0–100.0)
Platelets: 84 10*3/uL — ABNORMAL LOW (ref 150–400)
RBC: 2.43 MIL/uL — ABNORMAL LOW (ref 4.22–5.81)
RDW: 18.3 % — ABNORMAL HIGH (ref 11.5–15.5)
WBC: 4.4 10*3/uL (ref 4.0–10.5)
nRBC: 0 % (ref 0.0–0.2)

## 2023-08-27 LAB — URINALYSIS, W/ REFLEX TO CULTURE (INFECTION SUSPECTED)
Bilirubin Urine: NEGATIVE
Glucose, UA: NEGATIVE mg/dL
Ketones, ur: NEGATIVE mg/dL
Nitrite: NEGATIVE
Protein, ur: 100 mg/dL — AB
Specific Gravity, Urine: 1.011 (ref 1.005–1.030)
pH: 5 (ref 5.0–8.0)

## 2023-08-27 LAB — BASIC METABOLIC PANEL
Anion gap: 8 (ref 5–15)
BUN: 41 mg/dL — ABNORMAL HIGH (ref 8–23)
CO2: 26 mmol/L (ref 22–32)
Calcium: 9.4 mg/dL (ref 8.9–10.3)
Chloride: 104 mmol/L (ref 98–111)
Creatinine, Ser: 2.82 mg/dL — ABNORMAL HIGH (ref 0.61–1.24)
GFR, Estimated: 23 mL/min — ABNORMAL LOW (ref >60)
Glucose, Bld: 141 mg/dL — ABNORMAL HIGH (ref 70–99)
Potassium: 4.2 mmol/L (ref 3.5–5.1)
Sodium: 138 mmol/L (ref 135–145)

## 2023-08-27 LAB — BODY FLUID CULTURE W GRAM STAIN

## 2023-08-27 LAB — ANA W/REFLEX: Anti Nuclear Antibody (ANA): NEGATIVE

## 2023-08-27 LAB — CREATININE, URINE, RANDOM: Creatinine, Urine: 63 mg/dL

## 2023-08-27 LAB — SODIUM, URINE, RANDOM: Sodium, Ur: 30 mmol/L

## 2023-08-27 LAB — HEMOGLOBIN AND HEMATOCRIT, BLOOD
HCT: 25.5 % — ABNORMAL LOW (ref 39.0–52.0)
Hemoglobin: 8.2 g/dL — ABNORMAL LOW (ref 13.0–17.0)

## 2023-08-27 MED ORDER — TORSEMIDE 20 MG PO TABS
20.0000 mg | ORAL_TABLET | Freq: Two times a day (BID) | ORAL | Status: DC
  Administered 2023-08-28 – 2023-08-29 (×3): 20 mg via ORAL
  Filled 2023-08-27 (×3): qty 1

## 2023-08-27 MED ORDER — HYDRALAZINE HCL 50 MG PO TABS
50.0000 mg | ORAL_TABLET | Freq: Three times a day (TID) | ORAL | Status: DC
  Administered 2023-08-27 – 2023-08-29 (×6): 50 mg via ORAL
  Filled 2023-08-27 (×6): qty 1

## 2023-08-27 NOTE — Progress Notes (Signed)
 PROGRESS NOTE    [name]   [name] DOB: 1946/02/19  DOA: 08/22/2023 Date of Service: 08/27/23 which is hospital day 5  PCP: [name], MD    Hospital course / significant events:   HPI: [name] is a 78 y.o. male with medical history significant of CAD s/p CABG and DES, severe pulmonary hypertension, hypertension, hyperlipidemia, OSA, type 2 diabetes, hypothyroidism, paroxysmal Afib, HFpEF (severe LVH, mild AS, moderate AI, moderate to severe MR, moderate TR by echo 09/2022). To ED 08/22/23 c/o worsening fatigue over the past 3-4+ months, worsening orthopnea, PND, LE swelling.  Was evaluated by GI on 08/20/2023 - concern for upper GI bleeding. Baseline hgb 10. Hgb 8 during that evaluation, recs for EGD pending cardiology evaulation in setting of HFpEF etc. Pt states that stools are chronically dark, more dark over the past several weeks.   01/09: admitted to hospitalist service w/ acute on chronic HFpEF, upper GIB, ABLA (Hgb 6.9), AKI on CKD (Cr 2.5, baseline around 1.8). Also of note - moderate R and large L pleural effusion on CT imaging 08/19/2022 in setting of acute on chronic HFpEF. Per GI - holding any endoscopic eval/tx for now pending cardio eval / improvement in fluids status. Per cardiology - diurese, no diagnostics. IR consult for thoracentesis 01/10: AKI no improvement (Cr 2.63), Hgb improved to 7.5. Echo EF 60-65% and normal diastolic parameters. Per GI - reassess pending improvement in cardiopulm status. Per cardiology - continue diuresis. Total UOP approx 3L. R thoracentesis yield 1L fluid, more was present but only 1L removed.  01/11: Cr going up, holding diuresis.   01/12: Cr up to 3.01, Hgb to 7.3 question hypoperfusion - will follow FeNa and UA, 1 unit PRBC. EGD tm 01/13:  EGD stblae gastric polyps no bleeding. AKI seems to be improved after PRBC transfusion and holding lasix   01/14: renal function not improved, Hgb 7.9 no active bleeding. Will monitor labs  into tomorrow - resume diuresis per cardiology vs may need to have nephrology assess, hold off on PRBC transfusion now and monitor if still dropping may need further GI w/u  Consultants:  Gastroenterology Cardiology  IR  Procedures/Surgeries: 08/23/23 R thoracentesis       ASSESSMENT & PLAN:   Upper GI bleeding ABLA PRBC transfusion x1 unit 01/09, 1 unit 01/12 EGD 01/13 stable gastric polyps, no current GI bleed IV PPI bid GI following Hold anticoagulation, antiplatelet, NSAID - likely will have to hold antiplatelet/anticoag indefinitely  Trend CBC/HH  page GI with any acute hemodynamic changes, or signs of active GI bleeding  hold off on PRBC transfusion now and monitor if still dropping may need further GI w/u  Acute kidney injury superimposed on chronic kidney disease stage 3a ? Component of cardiorenal syndrome but not really improved w/ diuresis, seems to be improved after PRBC transfusion and holding lasix   Holding lasix  for now  Monitor renal function  FeNa and UA pending urine collection  resume diuresis per cardiology vs may need to have nephrology assess   Acute on chronic heart failure with preserved ejection fraction (HFpEF)  2D ECHO 12/2022 w/ EF 60-65%  Echo this admission also EF 60-65% and normal diastolic w/ mod-severe MR, mod-severe TR, mod AI, may be contributing to symptoms diuresis held past 2 days and today d/t AKI, may consider resume po tomorrow  Cardiology consult - no invasive diagnostics or stress test at this time  Strict Is and Os and daily weights   Statin Beta  blocker  holding ASA d/t GIB holding ACE/ARB d/t AKI Consider jardiance  if renal function improves resume diuresis per cardiology vs may need to have nephrology assess,   CAD (coronary artery disease), native coronary artery hold antiplatelet regimen in setting of concern for upper GI bleeding  Other cardiac meds as above     Pleural effusion moderate on R large on L  S/p R  thoracentesis 01/10 May need to repeat d/t residual fluid, see procedure note     Paroxysmal atrial fibrillation  Rate controlled  Hold eliquis  in setting of concern for upper GIB  Labetalol  resumed per cardiology, slightly low HR will add holding parameters for bradycardia    Hypokalemia Replace as needed Monitor BMP  Hyperlipidemia associated with CAD, DM2 statin    OSA (obstructive sleep apnea) CPAP qhs    Essential hypertension Monitor w/ diuresis           overweight based on BMI: Body mass index is 28.8 kg/m.  Underweight - under 18  overweight - 25 to 29 obese - 30 or more Class 1 obesity: BMI of 30.0 to 34 Class 2 obesity: BMI of 35.0 to 39 Class 3 obesity: BMI of 40.0 to 49 Super Morbid Obesity: BMI 50-59 Super-super Morbid Obesity: BMI 60+ Significantly low or high BMI is associated with higher medical risk.  Weight management advised as adjunct to other disease management and risk reduction treatments    DVT prophylaxis: SCD IV fluids: no continuous IV fluids d/t concern for CHF Nutrition: cardiac diet now  Central lines / invasive devices: none  Code Status: FULL CODE ACP documentation reviewed:  none on file in [facility]  TOC needs: TBD Barriers to dispo / significant pending items: AKI, if improving can likely restart lasix  po and dc next 1-2 days              Subjective / Brief ROS:  Patient reports feeling better today but tired No BM in a few days  Edema is improved  Denies CP/SOB.  Pain controlled. Tolerating diet.  Reports no concerns w/ urination/defecation.  Family Communication: none at this time    Objective Findings:  Vitals:   08/26/23 1900 08/27/23 0235 08/27/23 0500 08/27/23 0744  BP: (!) 132/58 (!) 140/68  (!) 161/95  Pulse: 67 (!) 51  (!) 58  Resp: 18 18  18   Temp: 97.8 F (36.6 C) 97.7 F (36.5 C)  98 F (36.7 C)  TempSrc: Oral Oral  Oral  SpO2: 95% 94%  97%  Weight:   94 kg   Height:       No intake  or output data in the 24 hours ending 08/27/23 1206  Filed Weights   08/26/23 0500 08/26/23 1210 08/27/23 0500  Weight: 93.6 kg 93.6 kg 94 kg    Examination:  Physical Exam Cardiovascular:     Rate and Rhythm: Normal rate.     Heart sounds: Murmur heard.  Pulmonary:     Effort: Pulmonary effort is normal.     Breath sounds: Examination of the right-lower field reveals rales. Examination of the left-lower field reveals rales. Rales (minimal) present.  Musculoskeletal:     Right lower leg: Edema (improved) present.     Left lower leg: Edema (improved) present.  Skin:    General: Skin is warm and dry.     Coloration: Skin is not pale.  Neurological:     General: No focal deficit present.     Mental Status: He is alert and  oriented to person, place, and time.  Psychiatric:        Mood and Affect: Mood normal.        Behavior: Behavior normal.          Scheduled Medications:   labetalol   100 mg Oral BID   pantoprazole  (PROTONIX ) IV  40 mg Intravenous Q12H   rosuvastatin   40 mg Oral Daily   sodium chloride  flush  3 mL Intravenous Q12H    Continuous Infusions:    PRN Medications:  hydrALAZINE , ondansetron  **OR** ondansetron  (ZOFRAN ) IV, sodium chloride  flush  Antimicrobials from admission:  Anti-infectives (From admission, onward)    None           Data Reviewed:  I have personally reviewed the following...  CBC: Recent Labs  Lab 08/22/23 0730 08/22/23 1300 08/24/23 0415 08/24/23 1633 08/25/23 0547 08/26/23 0440 08/27/23 0441  WBC 4.2  --  3.9*  --  4.6 5.1 4.4  HGB 6.9*   < > 7.7* 7.3* 7.3* 8.3* 7.9*  HCT 22.2*   < > 24.3* 22.7* 22.8* 25.2* 24.5*  MCV 105.2*  --  104.7*  --  102.7* 100.4* 100.8*  PLT 103*  --  95*  --  91* 89* 84*   < > = values in this interval not displayed.   Basic Metabolic Panel: Recent Labs  Lab 08/23/23 0452 08/24/23 0415 08/25/23 0814 08/26/23 0440 08/27/23 0441  NA 141 141 137 139 138  K 3.9 3.4* 3.3* 4.1  4.2  CL 107 105 103 103 104  CO2 21* 25 25 26 26   GLUCOSE 114* 120* 135* 141* 141*  BUN 43* 47* 42* 43* 41*  CREATININE 2.63* 2.97* 3.01* 2.79* 2.82*  CALCIUM  10.5* 10.3 9.7 9.6 9.4   GFR: Estimated Creatinine Clearance: 25.6 mL/min (A) (by C-G formula based on SCr of 2.82 mg/dL (H)). Liver Function Tests: Recent Labs  Lab 08/23/23 0452  AST 18  ALT 17  ALKPHOS 85  BILITOT 1.5*  PROT 6.9  ALBUMIN 4.1   No results for input(s): LIPASE, AMYLASE in the last 168 hours. No results for input(s): AMMONIA in the last 168 hours. Coagulation Profile: No results for input(s): INR, PROTIME in the last 168 hours. Cardiac Enzymes: No results for input(s): CKTOTAL, CKMB, CKMBINDEX, TROPONINI in the last 168 hours. BNP (last 3 results) No results for input(s): PROBNP in the last 8760 hours. HbA1C: No results for input(s): HGBA1C in the last 72 hours. CBG: Recent Labs  Lab 08/26/23 1217  GLUCAP 110*   Lipid Profile: No results for input(s): CHOL, HDL, LDLCALC, TRIG, CHOLHDL, LDLDIRECT in the last 72 hours. Thyroid  Function Tests: No results for input(s): TSH, T4TOTAL, FREET4, T3FREE, THYROIDAB in the last 72 hours. Anemia Panel: No results for input(s): VITAMINB12, FOLATE, FERRITIN, TIBC, IRON , RETICCTPCT in the last 72 hours.  Most Recent Urinalysis On File:     Component Value Date/Time   COLORURINE YELLOW (A) 08/27/2023 1000   APPEARANCEUR CLEAR (A) 08/27/2023 1000   LABSPEC 1.011 08/27/2023 1000   PHURINE 5.0 08/27/2023 1000   GLUCOSEU NEGATIVE 08/27/2023 1000   HGBUR SMALL (A) 08/27/2023 1000   BILIRUBINUR NEGATIVE 08/27/2023 1000   KETONESUR NEGATIVE 08/27/2023 1000   PROTEINUR 100 (A) 08/27/2023 1000   NITRITE NEGATIVE 08/27/2023 1000   LEUKOCYTESUR MODERATE (A) 08/27/2023 1000   Sepsis Labs: @LABRCNTIP (procalcitonin:4,lacticidven:4) Microbiology: Recent Results (from the past 240 hours)  Body fluid culture w  Gram Stain     Status: None   Collection Time: 08/23/23  12:10 PM   Specimen: A: PATH Cytology Pleural fluid   B: PATH Cytology Pleural fluid  Result Value Ref Range Status   Specimen Description   Final    PLEURAL Performed at [facility], [address] [place], [place] [address]    Special Requests   Final    PLEURAL Performed at [facility], [address] [place], [place] [address]    Gram Stain   Final    WBC PRESENT,BOTH PMN AND MONONUCLEAR NO ORGANISMS SEEN CYTOSPIN SMEAR    Culture   Final    NO GROWTH 3 DAYS Performed at [facility], [address], [place], [place] [address]    Report Status 08/27/2023 FINAL  Final      Radiology Studies last 3 days: US  LIVER DOPPLER Result Date: 08/24/2023 CLINICAL DATA:  Cirrhosis. EXAM: DUPLEX ULTRASOUND OF LIVER TECHNIQUE: Color and duplex Doppler ultrasound was performed to evaluate the hepatic in-flow and out-flow vessels. COMPARISON:  CT chest abdomen pelvis 08/20/2023 and abdominal ultrasound dated 08/06/2023 FINDINGS: Liver: Liver contour is mildly nodular. Liver parenchyma is mildly heterogeneous. No discrete liver lesion. Again noted are small echogenic foci along the gallbladder wall. These may represent small polyps and better characterized on the previous exam from 08/06/2023. Main Portal Vein size: 2.4 cm Portal Vein Velocities Main Prox:  39 cm/sec Main Mid: 37 cm/sec Main Dist:  36 cm/sec Right: 44 cm/sec Left: 43 cm/sec Hepatic Vein Velocities Right:  32 cm/sec Middle:  39 cm/sec Left:  33 cm/sec IVC: Present and patent with normal respiratory phasicity. Hepatic Artery Velocity:  41 cm/sec Splenic Vein Velocity:  32 cm/sec Spleen: 13.3 cm x 5.5 cm x 12.8 cm with a total volume of 487 cm^3 (411 cm^3 is upper limit normal) Portal Vein Occlusion/Thrombus: No Splenic Vein Occlusion/Thrombus: No Ascites: Present Varices: None Bilateral pleural effusions. Normal hepatopetal flow in the portal veins.  Normal hepatofugal flow in the hepatic veins. Small amount of ascites around the spleen. IMPRESSION: 1. Patent portal vein with normal direction of flow. 2. Liver contour is mildly nodular and concerning for cirrhosis. 3. Mild splenomegaly. 4. Bilateral pleural effusions.  Small amount of ascites. Electronically Signed   By: Juliene Balder M.D.   On: 08/24/2023 10:19   US  THORACENTESIS ASP PLEURAL SPACE W/IMG GUIDE Result Date: 08/23/2023 INDICATION: Patient is a 78 y/o male with history of CAD- s/p CABG, pulmonary HTN, HTN, OSA, type 2 diabetes mellitus, hypothyroidism, heart failure, anemia, and kidney disease. Patient presents for a diagnostic and therapeutic thoracentesis due to bilateral pleural effusions. EXAM: ULTRASOUND GUIDED right sided THORACENTESIS MEDICATIONS: 10mL lidocaine  1% COMPLICATIONS: None immediate. PROCEDURE: An ultrasound guided thoracentesis was thoroughly discussed with the patient and questions answered. The benefits, risks, alternatives and complications were also discussed. The patient understands and wishes to proceed with the procedure. Written consent was obtained. Ultrasound was performed to localize and mark an adequate pocket of fluid in the right chest. The area was then prepped and draped in the normal sterile fashion. 1% Lidocaine  was used for local anesthesia. Under ultrasound guidance a 6 Fr Safe-T-Centesis catheter was introduced. Thoracentesis was performed. The catheter was removed and a dressing applied. FINDINGS: A total of approximately 1L of amber colored fluid was removed. Samples were sent to the laboratory as requested by the clinical team. IMPRESSION: Successful ultrasound guided right thoracentesis yielding 1L of pleural fluid. Performed by Daved Hipp PA-C Electronically Signed   By: Wilkie Lent M.D.   On:  08/23/2023 13:32   DG Chest Port 1 View Result Date: 08/23/2023 CLINICAL DATA:  Pleural effusion.  Status post right thoracentesis. EXAM: PORTABLE  CHEST 1 VIEW COMPARISON:  Chest radiographs 08/22/2023 FINDINGS: The cardiac silhouette remains enlarged. Aortic atherosclerosis and previous CABG are again noted. Mild pulmonary vascular congestion is similar to the prior study. There are persistent small bilateral pleural effusions with bibasilar atelectasis. No pneumothorax is identified. IMPRESSION: Persistent small bilateral pleural effusions and bibasilar atelectasis. No pneumothorax. Electronically Signed   By: Dasie Hamburg M.D.   On: 08/23/2023 13:00      [facility] 08/27/2023, 12:06 PM    Dictation software may have been used to generate the above note. Typos may occur and escape review in typed/dictated notes. Please contact Dr Marsa directly for clarity if needed.  Staff may message me via secure chat in Epic  but this may not receive an immediate response,  please page me for urgent matters!  If 7PM-7AM, please contact night coverage [email or web address]

## 2023-08-27 NOTE — Progress Notes (Signed)
 [facility] CLINIC CARDIOLOGY PROGRESS NOTE       Patient ID: [name] MRN: [id number] DOB/AGE: November 26, 1945 78 y.o.  Admit date: 08/22/2023 Referring Physician Dr. Elspeth Masters Primary Physician [name], MD  Primary Cardiologist Dr. Florencio Reason for Consultation AoCHF  HPI: [name] is a 78 y.o. male  with a past medical history of CAD s/p CABG (LIMA-LAD, SVG - LCX 100% occluded, SVG - PDA s/p PCI (2021) with prior stents, paroxysmal AF (not anticoagulated), HFpEF (severe LVH, mild AS, moderate AI, moderate to severe MR, moderate TR by echo 09/2022) DM2, CKD, OSA  who presented to the ED on 08/22/2023 for worsening shortness of breath. Cardiology was consulted for further evaluation.   Interval history: -Patient seen and examined this morning.  Feeling better overall.  -Reports his shortness of breath is much improved.  LE edema is stable today. -Renal function stable today.  -BP and heart rate remained stable.   Review of systems complete and found to be negative unless listed above    Past Medical History:  Diagnosis Date   (HFpEF) heart failure with preserved ejection fraction (HCC) 07/25/2016   a.)TTE 07/25/16: EF >55, triv-mild pan regur, mild AS, G2DD; b.)TTE 09/02/16: EF >55, triv TR, mild AS; c.)TTE 11/05/16: EF >55, LAE, triv-mild pan regur, mild AS, G2DD; d.)TTE 01/21/20: EF >55, mod LVH, LAE/RVE, triv-mild AR/PR/TR, mod MR, mild AS, G2DD; e.)TTE 09/24/22: EF>55, sev LVH, sev LAE, RAE, mild PR, mod AR/MR/TR, mild-mod AS, RVSP 74.7; f.)TTE 12/23/22: EF 60-65, LAE, mild-mod MR/TR, mild AS   Abnormal urine 05/01/2023   Anemia    Aortic atherosclerosis (HCC)    Aortic stenosis 07/25/2016   a.) TTE 07/25/2016: mild (MPG 13.5); b.) TTE 09/02/2016: mild (MPG 16); c.) TTE 11/05/2016: mild (MPG 9); d.) TTE 01/21/2020: mild (MPG 11.7); e.) TTE 09/24/2022: mild-mod (MPG 20.3); f.) TTE 12/23/2022: mild (MPG 15)   Atrial fibrillation (HCC)    a.) CHA2DS2VASc = 6 (age x2,  CHF, HTN, vascular disease history, T2DM);  b.) rate/rhythm maintained on oral labetalol ; chronically anticoagulated with apixaban  + clopidogrel    AV block, 1st degree    B12 deficiency    CKD (chronic kidney disease), stage III (HCC)    Colon polyps    Congestive heart failure (HCC) 05/01/2023   Coronary artery disease 08/16/2016   a.) s/p 3v CABG 08/30/2016; b.) s/p PCI 11/26/2016 (DES x 1 oSVG-RCA); c.) s/p PCI 12/17/2016 (DES x 5 --> mLCx x2, dLCx, pLAD, dLAD); c.) s/p PCI 06/09/2020 (DES x1 --> ISR oSVG-PDA)   Cyst of kidney, acquired 07/03/2023   Dyspnea    Gastroesophageal reflux disease 05/01/2023   GERD (gastroesophageal reflux disease)    Heart murmur    Hepatic steatosis    History of 2019 novel coronavirus disease (COVID-19) 09/02/2020   a.) Tx'd with remdesivir    History of blood transfusion    History of GI bleed 09/02/2020   a.) admitted to [facility] 09/02/2020 - 09/08/2020   History of heart artery stent 11/26/2016   TOTAL of 7 stents: a.) 11/26/2016 --> 3.5 x 22 mm Resolute Integrity oSVG-RCA; b.) 12/17/2016: 3.0 x 12 mm Xience Alpine dLCx, 3.5 x 22 mm Resolute Onyx mLCx, 3.5 x 18 mm Resolute Onyx mLCx, 3.0 x 26 mm Resolute Onyx pLAD, 2.0 x 26 mm Resolute Onyx dLAD; c.) 06/09/2020 --> 2.5 x 18 mm Resolute Onyx ISR oSVG-PDA   History of partial colectomy 08/12/2012   a.) large gastric hyperplastic polyp removal   HLD (hyperlipidemia)  Hypertension    LBBB (left bundle branch block)    Long term current use of anticoagulant    a.) apixaban    Long term current use of antithrombotics/antiplatelets    a.) clopidogrel    Male hypogonadism    a.) on exogenous TRT (1.62% gel)   Mild cardiomegaly    Mild gynecomastia (bilateral)    Nephrolithiasis    OSA (obstructive sleep apnea)    a.) does not utilize nocturnal PAP therapy   Personal history of surgery to heart and great vessels, presenting hazards to health 05/01/2023   Proteinuria, unspecified 08/01/2023   Pulmonary  hypertension (HCC) 09/24/2022   a.) TTE 09/24/2022: RVSP 74.7; b.) TTE 12/23/2022: RVSP 31.3   RBBB (right bundle branch block with left anterior fascicular block)    Right inguinal hernia    Right thyroid  nodule    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   a.) LIMA-LAD, SVG-PDA, SVG-OM3   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/01/2023   Type 2 diabetes mellitus treated with insulin  (HCC)    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   Umbilical hernia    Unstable angina (HCC)    Vitamin D  deficiency     Past Surgical History:  Procedure Laterality Date   APPENDECTOMY     CARDIAC CATHETERIZATION Left 08/16/2016   Procedure: Left Heart Cath and Coronary Angiography;  Surgeon: Cara JONETTA Lovelace, MD;  Location: [facility] INVASIVE CV LAB;  Service: Cardiovascular;  Laterality: Left;   COLONOSCOPY N/A 09/07/2020   Procedure: COLONOSCOPY;  Surgeon: Janalyn Keene NOVAK, MD;  Location: [facility] ENDOSCOPY;  Service: Endoscopy;  Laterality: N/A;   COLONOSCOPY WITH PROPOFOL  N/A 05/17/2021   Procedure: COLONOSCOPY WITH PROPOFOL ;  Surgeon: Janalyn Keene NOVAK, MD;  Location: [facility] ENDOSCOPY;  Service: Endoscopy;  Laterality: N/A;   CORONARY ANGIOPLASTY WITH STENT PLACEMENT Left 11/26/2016   Procedure: CORONARY ANGIOPLASTY WITH STENT PLACEMENT; Location: [facility]; Surgeon: Alm Dais, MD   CORONARY ARTERY BYPASS GRAFT N/A 08/30/2016   Procedure: CORONARY ARTERY BYPASS GRAFT; Location: [facility]; Surgeon: Juliene Pouch, MD   CORONARY STENT INTERVENTION N/A 06/09/2020   Procedure: CORONARY STENT INTERVENTION;  Surgeon: Lovelace Cara JONETTA, MD;  Location: [facility] INVASIVE CV LAB;  Service: Cardiovascular;  Laterality: N/A;   CORONARY STENT INTERVENTION Left 12/17/2016   Procedure: CORONARY STENT INTERVENTION; Location: [facility]; Surgeon: Alm Dais, MD   ENDOSCOPIC MUCOSAL RESECTION N/A 09/22/2020   Procedure: ENDOSCOPIC MUCOSAL RESECTION;  Surgeon: [name] Aloha Raddle., MD;  Location: [facility] ENDOSCOPY;   Service: Gastroenterology;  Laterality: N/A;   ESOPHAGOGASTRODUODENOSCOPY N/A 09/07/2020   Procedure: ESOPHAGOGASTRODUODENOSCOPY (EGD);  Surgeon: Janalyn Keene NOVAK, MD;  Location: [facility] ENDOSCOPY;  Service: Endoscopy;  Laterality: N/A;   ESOPHAGOGASTRODUODENOSCOPY (EGD) WITH PROPOFOL  N/A 09/22/2020   Procedure: ESOPHAGOGASTRODUODENOSCOPY (EGD) WITH PROPOFOL ;  Surgeon: [name] Aloha Raddle., MD;  Location: [facility] ENDOSCOPY;  Service: Gastroenterology;  Laterality: N/A;   ESOPHAGOGASTRODUODENOSCOPY (EGD) WITH PROPOFOL  N/A 08/26/2023   Procedure: ESOPHAGOGASTRODUODENOSCOPY (EGD) WITH PROPOFOL ;  Surgeon: Jinny Carmine, MD;  Location: [facility] ENDOSCOPY;  Service: Endoscopy;  Laterality: N/A;   FRACTURE SURGERY     HEMOSTASIS CLIP PLACEMENT  09/22/2020   Procedure: HEMOSTASIS CLIP PLACEMENT;  Surgeon: [name] Aloha Raddle., MD;  Location: [facility] ENDOSCOPY;  Service: Gastroenterology;;   HEMOSTASIS CONTROL  09/22/2020   Procedure: HEMOSTASIS CONTROL;  Surgeon: [name] Aloha Raddle., MD;  Location: [facility] ENDOSCOPY;  Service: Gastroenterology;;   INSERTION OF MESH  02/19/2023   Procedure: INSERTION OF MESH;  Surgeon: Desiderio Schanz, MD;  Location: [facility] ORS;  Service: General;;  umbilical   LAPAROSCOPIC PARTIAL RIGHT COLECTOMY Right 08/12/2012   Procedure: LAPAROSCOPIC PARTIAL RIGHT COLECTOMY; Location: [facility]; Surgeon: Unknown Sharps, MD   LEFT HEART CATH AND CORONARY ANGIOGRAPHY Left 06/09/2020   Procedure: LEFT HEART CATH AND CORONARY ANGIOGRAPHY;  Surgeon: Florencio Cara BIRCH, MD;  Location: [facility] INVASIVE CV LAB;  Service: Cardiovascular;  Laterality: Left;   LEFT HEART CATH AND CORS/GRAFTS ANGIOGRAPHY Left 11/20/2016   Procedure: Left Heart Cath and Cors/Grafts Angiography;  Surgeon: Cara BIRCH Florencio, MD;  Location: [facility] INVASIVE CV LAB;  Service: Cardiovascular;  Laterality: Left;   POLYPECTOMY  09/22/2020   Procedure: POLYPECTOMY;  Surgeon: Mansouraty, Aloha Raddle., MD;  Location: [facility] ENDOSCOPY;  Service:  Gastroenterology;;   SUBMUCOSAL LIFTING INJECTION  09/22/2020   Procedure: SUBMUCOSAL LIFTING INJECTION;  Surgeon: [name] Aloha Raddle., MD;  Location: [facility] ENDOSCOPY;  Service: Gastroenterology;;   UMBILICAL HERNIA REPAIR N/A 02/19/2023   Procedure: HERNIA REPAIR UMBILICAL ADULT, open;  Surgeon: Desiderio Schanz, MD;  Location: [facility] ORS;  Service: General;  Laterality: N/A;   XI ROBOTIC ASSISTED INGUINAL HERNIA REPAIR WITH MESH Right 02/19/2023   Procedure: XI ROBOTIC ASSISTED INGUINAL HERNIA REPAIR WITH MESH;  Surgeon: Desiderio Schanz, MD;  Location: [facility] ORS;  Service: General;  Laterality: Right;    Medications Prior to Admission  Medication Sig Dispense Refill Last Dose/Taking   acetaminophen  (TYLENOL ) 500 MG tablet Take 2 tablets (1,000 mg total) by mouth every 6 (six) hours as needed for mild pain.   Taking As Needed   amLODipine  (NORVASC ) 5 MG tablet Take 5 mg by mouth 2 (two) times daily.   08/22/2023   apixaban  (ELIQUIS ) 5 MG TABS tablet Take 1 tablet (5 mg total) by mouth 2 (two) times daily. 60 tablet 0 08/22/2023 Morning   clopidogrel  (PLAVIX ) 75 MG tablet Take 75 mg by mouth daily.   08/22/2023   cyanocobalamin  (VITAMIN B12) 1000 MCG tablet Take 1,000 mcg by mouth daily.   08/22/2023   dexlansoprazole (DEXILANT) 60 MG capsule Take 1 capsule by mouth daily.   08/22/2023   Ferrous Sulfate  (IRON ) 325 (65 Fe) MG TABS Take 1 tablet by mouth daily.   08/22/2023   hydrALAZINE  (APRESOLINE ) 50 MG tablet Take 1 tablet by mouth 3 (three) times daily.   08/22/2023   labetalol  (NORMODYNE ) 100 MG tablet Take 1 tablet by mouth 2 (two) times daily.   08/22/2023   metFORMIN (GLUCOPHAGE-XR) 500 MG 24 hr tablet Take 500 mg by mouth 2 (two) times daily.   08/22/2023   nitroGLYCERIN  (NITROSTAT ) 0.4 MG SL tablet Place 0.4 mg under the tongue every 5 (five) minutes as needed.   Taking As Needed   rosuvastatin  (CRESTOR ) 40 MG tablet Take 40 mg by mouth daily.   08/22/2023   sitaGLIPtin (JANUVIA) 100 MG tablet Take 100 mg by mouth  daily.   08/22/2023   Testosterone  1.62 % GEL Apply 2 Pump topically daily. One pump on each arm 225 g 3 08/22/2023   torsemide  (DEMADEX ) 20 MG tablet Take 1 tablet (20 mg total) by mouth 2 (two) times daily.   08/22/2023   Social History   Socioeconomic History   Marital status: Married    Spouse name: Not on file   Number of children: Not on file   Years of education: Not on file   Highest education level: Not on file  Occupational History   Not on file  Tobacco Use   Smoking status: Former    Current packs/day: 0.00  Types: Cigarettes    Quit date: 2000    Years since quitting: 25.0    Passive exposure: Never   Smokeless tobacco: Never  Vaping Use   Vaping status: Never Used  Substance and Sexual Activity   Alcohol use: No   Drug use: No   Sexual activity: Not Currently  Other Topics Concern   Not on file  Social History Narrative   Not on file   Social Drivers of Health   Financial Resource Strain: Not on file  Food Insecurity: No Food Insecurity (08/23/2023)   Hunger Vital Sign    Worried About Running Out of Food in the Last Year: Never true    Ran Out of Food in the Last Year: Never true  Transportation Needs: No Transportation Needs (08/23/2023)   PRAPARE - Administrator, Civil Service (Medical): No    Lack of Transportation (Non-Medical): No  Physical Activity: Not on file  Stress: Not on file  Social Connections: Moderately Isolated (08/23/2023)   Social Connection and Isolation Panel [NHANES]    Frequency of Communication with Friends and Family: More than three times a week    Frequency of Social Gatherings with Friends and Family: Three times a week    Attends Religious Services: Never    Active Member of Clubs or Organizations: No    Attends Banker Meetings: Never    Marital Status: Married  Catering Manager Violence: Not At Risk (08/23/2023)   Humiliation, Afraid, Rape, and Kick questionnaire    Fear of Current or Ex-Partner: No     Emotionally Abused: No    Physically Abused: No    Sexually Abused: No    History reviewed. No pertinent family history.   Vitals:   08/26/23 1900 08/27/23 0235 08/27/23 0500 08/27/23 0744  BP: (!) 132/58 (!) 140/68  (!) 161/95  Pulse: 67 (!) 51  (!) 58  Resp: 18 18  18   Temp: 97.8 F (36.6 C) 97.7 F (36.5 C)  98 F (36.7 C)  TempSrc: Oral Oral  Oral  SpO2: 95% 94%  97%  Weight:   94 kg   Height:        PHYSICAL EXAM General: Chronically ill-appearing male, well nourished, in no acute distress. HEENT: Normocephalic and atraumatic. Neck: No JVD.  Lungs: Normal respiratory effort on room air.  Breath sounds diminished bilaterally Heart: HRRR. Normal S1 and S2 without gallops or murmurs.  Abdomen: Non-distended appearing.  Msk: Normal strength and tone for age. Extremities: Warm and well perfused. No clubbing, cyanosis.  1+ edema, chronic appearing tense woody lower extremities.  Neuro: Alert and oriented X 3. Psych: Answers questions appropriately.   Labs: Basic Metabolic Panel: Recent Labs    08/26/23 0440 08/27/23 0441  NA 139 138  K 4.1 4.2  CL 103 104  CO2 26 26  GLUCOSE 141* 141*  BUN 43* 41*  CREATININE 2.79* 2.82*  CALCIUM  9.6 9.4   Liver Function Tests: No results for input(s): AST, ALT, ALKPHOS, BILITOT, PROT, ALBUMIN in the last 72 hours.  No results for input(s): LIPASE, AMYLASE in the last 72 hours. CBC: Recent Labs    08/26/23 0440 08/27/23 0441  WBC 5.1 4.4  HGB 8.3* 7.9*  HCT 25.2* 24.5*  MCV 100.4* 100.8*  PLT 89* 84*   Cardiac Enzymes: No results for input(s): CKTOTAL, CKMB, CKMBINDEX, TROPONINIHS in the last 72 hours.  BNP: No results for input(s): BNP in the last 72 hours.  D-Dimer: No results  for input(s): DDIMER in the last 72 hours. Hemoglobin A1C: No results for input(s): HGBA1C in the last 72 hours. Fasting Lipid Panel: No results for input(s): CHOL, HDL, LDLCALC, TRIG,  CHOLHDL, LDLDIRECT in the last 72 hours. Thyroid  Function Tests: No results for input(s): TSH, T4TOTAL, T3FREE, THYROIDAB in the last 72 hours.  Invalid input(s): FREET3 Anemia Panel: No results for input(s): VITAMINB12, FOLATE, FERRITIN, TIBC, IRON , RETICCTPCT in the last 72 hours.    Radiology: US  LIVER DOPPLER Result Date: 08/24/2023 CLINICAL DATA:  Cirrhosis. EXAM: DUPLEX ULTRASOUND OF LIVER TECHNIQUE: Color and duplex Doppler ultrasound was performed to evaluate the hepatic in-flow and out-flow vessels. COMPARISON:  CT chest abdomen pelvis 08/20/2023 and abdominal ultrasound dated 08/06/2023 FINDINGS: Liver: Liver contour is mildly nodular. Liver parenchyma is mildly heterogeneous. No discrete liver lesion. Again noted are small echogenic foci along the gallbladder wall. These may represent small polyps and better characterized on the previous exam from 08/06/2023. Main Portal Vein size: 2.4 cm Portal Vein Velocities Main Prox:  39 cm/sec Main Mid: 37 cm/sec Main Dist:  36 cm/sec Right: 44 cm/sec Left: 43 cm/sec Hepatic Vein Velocities Right:  32 cm/sec Middle:  39 cm/sec Left:  33 cm/sec IVC: Present and patent with normal respiratory phasicity. Hepatic Artery Velocity:  41 cm/sec Splenic Vein Velocity:  32 cm/sec Spleen: 13.3 cm x 5.5 cm x 12.8 cm with a total volume of 487 cm^3 (411 cm^3 is upper limit normal) Portal Vein Occlusion/Thrombus: No Splenic Vein Occlusion/Thrombus: No Ascites: Present Varices: None Bilateral pleural effusions. Normal hepatopetal flow in the portal veins. Normal hepatofugal flow in the hepatic veins. Small amount of ascites around the spleen. IMPRESSION: 1. Patent portal vein with normal direction of flow. 2. Liver contour is mildly nodular and concerning for cirrhosis. 3. Mild splenomegaly. 4. Bilateral pleural effusions.  Small amount of ascites. Electronically Signed   By: Juliene Balder M.D.   On: 08/24/2023 10:19   US  THORACENTESIS ASP  PLEURAL SPACE W/IMG GUIDE Result Date: 08/23/2023 INDICATION: Patient is a 78 y/o male with history of CAD- s/p CABG, pulmonary HTN, HTN, OSA, type 2 diabetes mellitus, hypothyroidism, heart failure, anemia, and kidney disease. Patient presents for a diagnostic and therapeutic thoracentesis due to bilateral pleural effusions. EXAM: ULTRASOUND GUIDED right sided THORACENTESIS MEDICATIONS: 10mL lidocaine  1% COMPLICATIONS: None immediate. PROCEDURE: An ultrasound guided thoracentesis was thoroughly discussed with the patient and questions answered. The benefits, risks, alternatives and complications were also discussed. The patient understands and wishes to proceed with the procedure. Written consent was obtained. Ultrasound was performed to localize and mark an adequate pocket of fluid in the right chest. The area was then prepped and draped in the normal sterile fashion. 1% Lidocaine  was used for local anesthesia. Under ultrasound guidance a 6 Fr Safe-T-Centesis catheter was introduced. Thoracentesis was performed. The catheter was removed and a dressing applied. FINDINGS: A total of approximately 1L of amber colored fluid was removed. Samples were sent to the laboratory as requested by the clinical team. IMPRESSION: Successful ultrasound guided right thoracentesis yielding 1L of pleural fluid. Performed by Daved Hipp PA-C Electronically Signed   By: Wilkie Lent M.D.   On: 08/23/2023 13:32   DG Chest Port 1 View Result Date: 08/23/2023 CLINICAL DATA:  Pleural effusion.  Status post right thoracentesis. EXAM: PORTABLE CHEST 1 VIEW COMPARISON:  Chest radiographs 08/22/2023 FINDINGS: The cardiac silhouette remains enlarged. Aortic atherosclerosis and previous CABG are again noted. Mild pulmonary vascular congestion is similar to the prior study. There  are persistent small bilateral pleural effusions with bibasilar atelectasis. No pneumothorax is identified. IMPRESSION: Persistent small bilateral pleural  effusions and bibasilar atelectasis. No pneumothorax. Electronically Signed   By: Dasie Hamburg M.D.   On: 08/23/2023 13:00   ECHOCARDIOGRAM COMPLETE Result Date: 08/23/2023    ECHOCARDIOGRAM REPORT   Patient Name:   [name] Date of Exam: 08/23/2023 Medical Rec #:  [id number]        Height:       73.0 in Accession #:    [id number]       Weight:       218.3 lb Date of Birth:  18-Feb-1946         BSA:          2.233 m Patient Age:    77 years         BP:           165/73 mmHg Patient Gender: M                HR:           68 bpm. Exam Location:  [facility] Procedure: 2D Echo, Cardiac Doppler and Color Doppler Indications:     CHF-acute systolic I50.21  History:         Patient has prior history of Echocardiogram examinations, most                  recent 12/23/2022. Arrythmias:LBBB; Signs/Symptoms:Dyspnea.  Sonographer:     Christopher Furnace Referring Phys:  [id number] ELSPETH PARAS NEWTON Diagnosing Phys: Marsa Dooms MD IMPRESSIONS  1. Left ventricular ejection fraction, by estimation, is 60 to 65%. The left ventricle has normal function. The left ventricle has no regional wall motion abnormalities. Left ventricular diastolic parameters were normal.  2. Right ventricular systolic function is normal. The right ventricular size is normal.  3. Left atrial size was mildly dilated.  4. Right atrial size was mildly dilated.  5. The mitral valve is normal in structure. Mild to moderate mitral valve regurgitation. No evidence of mitral stenosis.  6. The aortic valve is normal in structure. Aortic valve regurgitation is mild to moderate. Mild to moderate aortic valve stenosis.  7. The inferior vena cava is normal in size with greater than 50% respiratory variability, suggesting right atrial pressure of 3 mmHg. FINDINGS  Left Ventricle: Left ventricular ejection fraction, by estimation, is 60 to 65%. The left ventricle has normal function. The left ventricle has no regional wall motion abnormalities. The left ventricular internal cavity  size was normal in size. There is  no left ventricular hypertrophy. Left ventricular diastolic parameters were normal. Right Ventricle: The right ventricular size is normal. No increase in right ventricular wall thickness. Right ventricular systolic function is normal. Left Atrium: Left atrial size was mildly dilated. Right Atrium: Right atrial size was mildly dilated. Pericardium: Trivial pericardial effusion is present. Mitral Valve: The mitral valve is normal in structure. Mild to moderate mitral valve regurgitation. No evidence of mitral valve stenosis. Tricuspid Valve: The tricuspid valve is normal in structure. Tricuspid valve regurgitation is not demonstrated. No evidence of tricuspid stenosis. Aortic Valve: The aortic valve is normal in structure. Aortic valve regurgitation is mild to moderate. Mild to moderate aortic stenosis is present. Aortic valve mean gradient measures 12.7 mmHg. Aortic valve peak gradient measures 22.0 mmHg. Aortic valve  area, by VTI measures 1.57 cm. Pulmonic Valve: The pulmonic valve was normal in structure. Pulmonic valve regurgitation is not visualized. No evidence of pulmonic  stenosis. Aorta: The aortic root is normal in size and structure. Venous: The inferior vena cava is normal in size with greater than 50% respiratory variability, suggesting right atrial pressure of 3 mmHg. IAS/Shunts: No atrial level shunt detected by color flow Doppler.  LEFT VENTRICLE PLAX 2D LVIDd:         5.10 cm   Diastology LVIDs:         3.10 cm   LV e' medial:    6.09 cm/s LV PW:         0.80 cm   LV E/e' medial:  21.7 LV IVS:        1.40 cm   LV e' lateral:   13.80 cm/s LVOT diam:     2.00 cm   LV E/e' lateral: 9.6 LV SV:         73 LV SV Index:   33 LVOT Area:     3.14 cm  RIGHT VENTRICLE RV Basal diam:  4.60 cm RV Mid diam:    4.00 cm LEFT ATRIUM             Index        RIGHT ATRIUM           Index LA diam:        4.80 cm 2.15 cm/m   RA Area:     21.80 cm LA Vol (A2C):   71.1 ml 31.84 ml/m   RA Volume:   64.80 ml  29.02 ml/m LA Vol (A4C):   68.6 ml 30.72 ml/m LA Biplane Vol: 74.2 ml 33.23 ml/m  AORTIC VALVE AV Area (Vmax):    1.33 cm AV Area (Vmean):   1.38 cm AV Area (VTI):     1.57 cm AV Vmax:           234.67 cm/s AV Vmean:          163.667 cm/s AV VTI:            0.466 m AV Peak Grad:      22.0 mmHg AV Mean Grad:      12.7 mmHg LVOT Vmax:         99.00 cm/s LVOT Vmean:        71.700 cm/s LVOT VTI:          0.232 m LVOT/AV VTI ratio: 0.50  AORTA Ao Root diam: 3.40 cm MITRAL VALVE                TRICUSPID VALVE MV Area (PHT): 4.24 cm     TR Peak grad:   40.4 mmHg MV Decel Time: 179 msec     TR Vmax:        318.00 cm/s MV E velocity: 132.00 cm/s MV A velocity: 50.30 cm/s   SHUNTS MV E/A ratio:  2.62         Systemic VTI:  0.23 m                             Systemic Diam: 2.00 cm Marsa Dooms MD Electronically signed by Marsa Dooms MD Signature Date/Time: 08/23/2023/11:45:16 AM    Final    DG Chest 2 View Result Date: 08/22/2023 CLINICAL DATA:  78 year old male with shortness of breath, fatigue. EXAM: CHEST - 2 VIEW COMPARISON:  CT Chest, Abdomen, and Pelvis 08/20/2023 and earlier. FINDINGS: PA and lateral views 0743 hours today. Stable cardiomegaly and mediastinal contours. Prior CABG. Ongoing bilateral pleural effusions, moderate and slightly  larger on the right. No pneumothorax. Bilateral pulmonary vascular congestion is relatively mild, mild interstitial edema is possible. No air bronchograms. Overall ventilation not significantly changed from recent CT. No acute osseous abnormality identified. Negative visible bowel gas. IMPRESSION: Bilateral pleural effusions and interstitial edema. Ventilation not significantly changed from CT two days ago. Electronically Signed   By: VEAR Hurst M.D.   On: 08/22/2023 07:59   CT CHEST ABDOMEN PELVIS WO CONTRAST Result Date: 08/20/2023 CLINICAL DATA:  Follow-up pleural effusions and abdominal ascites. EXAM: CT CHEST, ABDOMEN AND PELVIS WITHOUT  CONTRAST TECHNIQUE: Multidetector CT imaging of the chest, abdomen and pelvis was performed following the standard protocol without IV contrast. RADIATION DOSE REDUCTION: This exam was performed according to the departmental dose-optimization program which includes automated exposure control, adjustment of the mA and/or kV according to patient size and/or use of iterative reconstruction technique. COMPARISON:  Abdominal ultrasound 08/06/2023 FINDINGS: CT CHEST FINDINGS Cardiovascular: The heart is mildly enlarged. The aorta is normal in caliber. Scattered atherosclerotic calcifications. Three-vessel coronary artery calcifications and surgical changes from prior coronary artery bypass surgery. Mediastinum/Nodes: Several enlarged mediastinal and hilar lymph nodes which could be related to the patient's lung findings and pleural effusions. The esophagus is grossly normal. There is a 2.3 cm right thyroid  lobe lesion. This has been evaluated on previous imaging. (ref: J Am Coll Radiol. 2015 Feb;12(2): 143-50). Lungs/Pleura: Moderate-sized left pleural effusion and large right pleural effusion. Overlying atelectasis and moderate fluid in the left major fissure. No pulmonary edema. No worrisome pulmonary lesions. Musculoskeletal: Advanced degenerative changes involving the thoracic spine. Median sternotomy wires related to prior heart surgery. CT ABDOMEN PELVIS FINDINGS Hepatobiliary: Suspect cirrhotic changes involving the liver with irregular hepatic contour, dilated hepatic fissures and increased caudate to right lobe ratio. No hepatic lesions are identified without contrast peer small calcified granuloma noted in the right lobe. The gallbladder is moderately contracted. There is associated gallbladder wall thickening and pericholecystic fluid likely due to the patient's ascites. No biliary dilatation. Pancreas: No mass, inflammation or ductal dilatation. Spleen: Within normal limits in size.  No splenic lesions.  Adrenals/Urinary Tract: The adrenal glands are normal. Simple bilateral renal cysts and bilateral renal calculi not requiring any further imaging evaluation or follow-up. The bladder is unremarkable. Stomach/Bowel: The stomach, duodenum, small bowel and colon grossly normal. Evidence of prior colonic surgery. Vascular/Lymphatic: Advanced atherosclerotic calcification involving the aorta, iliac arteries and branch vessels but no aneurysm. Borderline mesenteric and retroperitoneal lymph nodes likely related to the patient's ascites and cirrhosis. Reproductive: The prostate gland and seminal vesicles are unremarkable. Other: Small/moderate volume abdominal/pelvic ascites. Musculoskeletal: No significant bony findings. Advanced degenerative changes involving the spine and moderate bilateral hip joint degenerative changes. IMPRESSION: 1. Moderate-sized left pleural effusion and large right pleural effusion. Overlying atelectasis and moderate fluid in the left major fissure. 2. Several enlarged mediastinal and hilar lymph nodes which could be related to the patient's lung findings and pleural effusions. 3. Cirrhotic changes involving the liver. No hepatic lesions are identified without contrast. 4. Small/moderate volume abdominal/pelvic ascites. 5. Gallbladder wall thickening and pericholecystic fluid likely due to the patient's ascites and cirrhosis. 6. Advanced atherosclerotic calcification.  No aneurysm. Aortic Atherosclerosis (ICD10-I70.0). Electronically Signed   By: MYRTIS Stammer M.D.   On: 08/20/2023 14:48   US  ABDOMEN LIMITED RUQ (LIVER/GB) Result Date: 08/06/2023 CLINICAL DATA:  Evaluate for ascites.  Elevated LFTs. EXAM: ULTRASOUND ABDOMEN LIMITED RIGHT UPPER QUADRANT COMPARISON:  Ultrasound abdomen 09/19/2022 FINDINGS: Gallbladder: Multiple gallbladder polyps measuring up  to 8 mm. Small stones in the gallbladder lumen. No gallbladder wall thickening or pericholecystic fluid. Common bile duct: Diameter: 3.1  mm Liver: Mild coarsened hepatic parenchymal echogenicity. No focal lesion. Portal vein is patent on color Doppler imaging with normal direction of blood flow towards the liver. Other: Bilateral pleural effusions. Upper abdominal ascites. 2.1 cm cyst exophytic superior pole right kidney. No imaging follow-up needed. IMPRESSION: 1. Upper abdominal ascites. 2. Bilateral pleural effusions. 3. Cholelithiasis without secondary signs of acute cholecystitis. 4. Multiple gallbladder polyps measuring up to 8 mm. Recommend follow-up ultrasound in 6 months. Electronically Signed   By: Bard Moats M.D.   On: 08/06/2023 09:06    ECHO as above  TELEMETRY reviewed by me 08/27/2023: Not on telemetry  EKG reviewed by me: Atrial fibrillation rate 59 bpm  Data reviewed by me 08/27/2023: last 24h vitals tele labs imaging I/O hospitalist progress note, GI notes  Principal Problem:   CHF exacerbation (HCC) Active Problems:   Essential hypertension   OSA (obstructive sleep apnea)   CAD (coronary artery disease), native coronary artery   Hyperlipidemia associated with type 2 diabetes mellitus (HCC)   Acute blood loss anemia   Acute heart failure with preserved ejection fraction (HFpEF) (HCC)   Paroxysmal atrial fibrillation (HCC)   Acute kidney injury superimposed on chronic kidney disease (HCC)   Upper GI bleeding   Pleural effusion   Symptomatic anemia   Cirrhosis of liver with ascites (HCC)   Melena    ASSESSMENT AND PLAN:  [name] is a 78 y.o. male  with a past medical history of CAD s/p CABG (LIMA-LAD, SVG - LCX 100% occluded, SVG - PDA s/p PCI (2021) with prior stents, paroxysmal AF (not anticoagulated), HFpEF (severe LVH, mild AS, moderate AI, moderate to severe MR, moderate TR by echo 09/2022) DM2, CKD, OSA  who presented to the ED on 08/22/2023 for worsening shortness of breath. Cardiology was consulted for further evaluation.   # Acute on chronic HFpEF # Coronary artery disease Presenting  with worsening shortness of breath over the last few weeks.  Also noticed increased weight and lower extremity edema.  BNP elevated at 1069.  Troponins trended 23 > 24.  CXR with bilateral pleural effusions and interstitial edema. S/p thoracentesis with 1L removed. Echo this admission with EF 60-65%, no WMAs, mild to moderate AS. Volume status appears improved. -Plan to resume po torsemide  tomorrow.  -Continue labetalol  100 mg twice daily.  -Continue rosuvastatin  40 mg daily.  Defer resuming plavix  on DC given GIB and it has been >3 years since last stent. -No plan for further cardiac diagnostics at this time.  # Upper GI bleed Patient recently undergoing workup outpatient for upper GI bleed.  Planning to have EGD outpatient.  Hemoglobin down to 6.9 today. -S/p transfusion 1/9. -GI following.  # Paroxysmal atrial fibrillation  Patient with a history of paroxysmal atrial fibrillation on labetalol  at home for rate control as well as Eliquis .  Denies any palpitation symptoms. -Continue home labetalol  as above. -Eliquis  held given GIB. Recommend resuming once H/H stable and ok per GI.  Cardiology will sign off. Please haiku with questions or re-engage if needed.    This patient's plan of care was discussed and created with Dr. Wilburn and he is in agreement.  Signed: Danita Bloch, PA-C  08/27/2023, 12:46 PM [facility] Cardiology

## 2023-08-27 NOTE — Progress Notes (Signed)
 Heart Failure Stewardship Pharmacy Note  PCP: [name], MD PCP-Cardiologist: None  HPI: [name] is a 78 y.o. male with CAD s/p CABG (LIMA-LAD, SVG - LCX 100% occluded, SVG - PDA s/p PCI (2021) with prior stents, paroxysmal AF (not anticoagulated), HFpEF (severe LVH, mild AS, moderate AI, moderate to severe MR, moderate TR by echo 09/2022) DM2, CKD, OSA who presented with shortness of breath, dyspnea on exertion, and anasarca that has progressed over the past 2-3 months. Reports symptoms began months ago after stopping torsemide . Diuretics were restarted every other day at his cardiology visit, then recently increased on 08/20/23. BNP on admission was 1069.2. HS troponin on admission was 23 >24. Iron  labs meet HF replacement criteria and he has infusions scheduled for the next 2 Mondays. CXR showed bilateral pleural effusions, pulmonary vascular congestion, and pulmonary edema. Upper abdominal ascites present on abdominal ultrasound. Thoracentesis yielded 1L. GI planning EGD to evaluate upper GI bleed.  Pertinent Lab Values: Creatinine  Date Value Ref Range Status  08/04/2012 1.03 0.60 - 1.30 mg/dL Final   Creatinine, Ser  Date Value Ref Range Status  08/27/2023 2.82 (H) 0.61 - 1.24 mg/dL Final   BUN  Date Value Ref Range Status  08/27/2023 41 (H) 8 - 23 mg/dL Final  87/76/7986 17 7 - 18 mg/dL Final   Potassium  Date Value Ref Range Status  08/27/2023 4.2 3.5 - 5.1 mmol/L Final  08/04/2012 4.1 3.5 - 5.1 mmol/L Final   Sodium  Date Value Ref Range Status  08/27/2023 138 135 - 145 mmol/L Final  08/04/2012 139 136 - 145 mmol/L Final   B Natriuretic Peptide  Date Value Ref Range Status  08/22/2023 1,069.2 (H) 0.0 - 100.0 pg/mL Final    Comment:    Performed at [facility], [address] [place], [place] [address]   Magnesium   Date Value Ref Range Status  12/27/2022 2.3 1.7 - 2.4 mg/dL Final    Comment:    Performed at [facility], [address] [place], [place] [address]   Hgb A1c MFr Bld  Date Value Ref Range Status  12/21/2022 5.8 (H) 4.8 - 5.6 % Final    Comment:    (NOTE) Pre diabetes:          5.7%-6.4%  Diabetes:              >6.4%  Glycemic control for   <7.0% adults with diabetes    TSH  Date Value Ref Range Status  09/03/2020 3.235 0.350 - 4.500 uIU/mL Final    Comment:    Performed by a 3rd Generation assay with a functional sensitivity of <=0.01 uIU/mL. Performed at [facility], [address] [place], [place] [address]     Vital Signs: Admission weight: 218.2 lbs Temp:  [96.6 F (35.9 C)-98.2 F (36.8 C)] 97.7 F (36.5 C) (01/14 0235) Pulse Rate:  [51-71] 51 (01/14 0235) Resp:  [16-20] 18 (01/14 0235) BP: (132-180)/(58-91) 140/68 (01/14 0235) SpO2:  [93 %-97 %] 94 % (01/14 0235) Weight:  [93.6 kg (206 lb 5.6 oz)-94 kg (207 lb 3.7 oz)] 94 kg (207 lb 3.7 oz) (01/14 0500) No intake or output data in the 24 hours ending 08/27/23 0610   Current Heart Failure Medications:  Loop diuretic: furosemide  60 mg IV BID Beta-Blocker: labetalol  100 mg BID ACEI/ARB/ARNI: none MRA: none SGLT2i: none   Prior to admission Heart Failure Medications:  Loop diuretic: torsemide  20 mg daily (recently increased) Beta-Blocker: labetalol   100 mg BID ACEI/ARB/ARNI: none MRA: none SGLT2i: none Other vasoactive medications include amlodipine  10 mg daily  Assessment: 1. Acute on chronic diastolic heart failure (LVEF in 12/2022 60-65%) and new echo pending, due to ICM vs mixed etiology. NYHA class IV symptoms.  -Symptoms: Reports shortness of breath, DOE, and LEE have all improved. Able to walk to the bathroom without shortness of breath today. Feeling much improved since admission. -Volume: Agree with restarting diuretics. Creatinine is stable. -Hemodynamics: BP is elevated ~150s/80s. HR is stable -SGLT2i: If eGFR is 25 prior to discharge, consider adding SGLT2i as first line treatment of HFpEF, CKD, and  DM. Jardiance  has slightly better data with lower eGFRs.  -MRA: Will not likely be a candidate for MRAs due to renal function. Could consider Kerendia if creatinine improved given indication for diabetic nephropathy treatment. -ARNI: Can consider Entresto given hypertension if creatinine stabilizes <2 due for prevention of CKD progression in DM and HFpEF. Not currently a candidate.  -BB is indicated on the basis of CAD, however diastolic HF does not tolerate low HR well. Can consider reducing labetalol  and adding afterload reduction  Plan: 1) Medication changes recommended at this time: -Consider starting hydralazine  50 mg TID for HTN -Consider reducing labetalol  to 50 mg BID  2) Patient assistance: -Eliquis  copay is $86.93, Xarelto copay is $86.93, Entresto copay is $86.93, Doreen copay is $86.93, Jardiance  copay is $86.93. Copays high due to a $25.00 deductible   3) Education: - Patient has been educated on current HF medications and potential additions to HF medication regimen - Patient verbalizes understanding that over the next few months, these medication doses may change and more medications may be added to optimize HF regimen - Patient has been educated on basic disease state pathophysiology and goals of therapy  Medication Assistance / Insurance Benefits Check: Does the patient have prescription insurance?    Type of insurance plan:  Does the patient qualify for medication assistance through manufacturers or grants? Pending  Eligible grants and/or patient assistance programs: pending  Medication assistance applications in progress: pending  Medication assistance applications approved: pending Approved medication assistance renewals will be completed by: pending.  Outpatient Pharmacy: Prior to admission outpatient pharmacy: [facility]     Please do not hesitate to reach out with questions or concerns,  Jaun Bash, PharmD, CPP, BCPS Heart Failure Pharmacist  Phone -  [phone] 08/27/2023 6:10 AM

## 2023-08-27 NOTE — Progress Notes (Signed)
 [name], MD [facility]   [address], [address] [place], [place] [address] Phone: [phone] Fax : [phone]   Subjective: The patient reports doing well today and feeling fine.  The patient did have a slight drop in hemoglobin but not significant.  The patient has not had any further black stools or bloody stools in the last few days.  The patient's upper endoscopy showed the patient to have multiple gastric polyps none of which were bleeding.  There is no old blood or new blood in the GI tract seen.   Objective: Vital signs in last 24 hours: Vitals:   08/26/23 1900 08/27/23 0235 08/27/23 0500 08/27/23 0744  BP: (!) 132/58 (!) 140/68  (!) 161/95  Pulse: 67 (!) 51  (!) 58  Resp: 18 18  18   Temp: 97.8 F (36.6 C) 97.7 F (36.5 C)  98 F (36.7 C)  TempSrc: Oral Oral  Oral  SpO2: 95% 94%  97%  Weight:   94 kg   Height:       Weight change: -0.022 kg  Intake/Output Summary (Last 24 hours) at 08/27/2023 1528 Last data filed at 08/27/2023 1424 Gross per 24 hour  Intake 240 ml  Output --  Net 240 ml     Exam: General: Patient sitting up in a chair no apparent distress alert and oriented x 3   Lab Results: @LABTEST2 @ Micro Results: Recent Results (from the past 240 hours)  Body fluid culture w Gram Stain     Status: None   Collection Time: 08/23/23 12:10 PM   Specimen: A: PATH Cytology Pleural fluid   B: PATH Cytology Pleural fluid  Result Value Ref Range Status   Specimen Description   Final    PLEURAL Performed at [facility], [address], [place], [place] [address]    Special Requests   Final    PLEURAL Performed at [facility], [address] [place], [place] [address]    Gram Stain   Final    WBC PRESENT,BOTH PMN AND MONONUCLEAR NO ORGANISMS SEEN CYTOSPIN SMEAR    Culture   Final    NO GROWTH 3 DAYS Performed at [facility], [address], [place], [place] [address]    Report Status 08/27/2023 FINAL  Final    Studies/Results: No results found. Medications: I have reviewed the patient's current medications. Scheduled Meds:  hydrALAZINE   50 mg Oral TID   labetalol   100 mg Oral BID   pantoprazole  (PROTONIX ) IV  40 mg Intravenous Q12H   rosuvastatin   40 mg Oral Daily   sodium chloride  flush  3 mL Intravenous Q12H   [START ON 08/28/2023] torsemide   20 mg Oral BID   Continuous Infusions: PRN Meds:.hydrALAZINE , ondansetron  **OR** ondansetron  (ZOFRAN ) IV, sodium chloride  flush   Assessment: Principal Problem:   CHF exacerbation (HCC) Active Problems:   Essential hypertension   OSA (obstructive sleep apnea)   CAD (coronary artery disease), native coronary artery   Hyperlipidemia associated with type 2 diabetes mellitus (HCC)   Acute blood loss anemia   Acute heart failure with preserved ejection fraction (HFpEF) (HCC)   Paroxysmal atrial fibrillation (HCC)   Acute kidney injury superimposed on chronic kidney disease (HCC)   Upper GI bleeding   Pleural effusion   Symptomatic anemia   Cirrhosis of liver with ascites (HCC)   Melena    Plan: The patient will be followed another night to see if his hemoglobin stays stable.  There is no  sign of any overt GI bleeding.  The patient's hemoglobin was only slightly down today.  Not planning any further GI intervention at this time.  If there should be any sign of GI bleeding overnight or a significant drop in hemoglobin further studies can be considered.   LOS: 5 days   Rogelia Jinny HAMMANS 08/27/2023, 3:28 PM Pager [phone] 7am-5pm  Check AMION for 5pm -7am coverage and on weekends

## 2023-08-28 ENCOUNTER — Other Ambulatory Visit: Payer: Self-pay

## 2023-08-28 ENCOUNTER — Encounter: Payer: Self-pay | Admitting: Oncology

## 2023-08-28 DIAGNOSIS — K317 Polyp of stomach and duodenum: Secondary | ICD-10-CM | POA: Diagnosis not present

## 2023-08-28 DIAGNOSIS — K7460 Unspecified cirrhosis of liver: Secondary | ICD-10-CM | POA: Diagnosis not present

## 2023-08-28 DIAGNOSIS — I5033 Acute on chronic diastolic (congestive) heart failure: Secondary | ICD-10-CM | POA: Diagnosis not present

## 2023-08-28 LAB — CBC
HCT: 23.9 % — ABNORMAL LOW (ref 39.0–52.0)
Hemoglobin: 7.8 g/dL — ABNORMAL LOW (ref 13.0–17.0)
MCH: 32.6 pg (ref 26.0–34.0)
MCHC: 32.6 g/dL (ref 30.0–36.0)
MCV: 100 fL (ref 80.0–100.0)
Platelets: 85 10*3/uL — ABNORMAL LOW (ref 150–400)
RBC: 2.39 MIL/uL — ABNORMAL LOW (ref 4.22–5.81)
RDW: 17.5 % — ABNORMAL HIGH (ref 11.5–15.5)
WBC: 5 10*3/uL (ref 4.0–10.5)
nRBC: 0 % (ref 0.0–0.2)

## 2023-08-28 LAB — BASIC METABOLIC PANEL
Anion gap: 10 (ref 5–15)
BUN: 38 mg/dL — ABNORMAL HIGH (ref 8–23)
CO2: 25 mmol/L (ref 22–32)
Calcium: 9.3 mg/dL (ref 8.9–10.3)
Chloride: 103 mmol/L (ref 98–111)
Creatinine, Ser: 2.49 mg/dL — ABNORMAL HIGH (ref 0.61–1.24)
GFR, Estimated: 26 mL/min — ABNORMAL LOW (ref >60)
Glucose, Bld: 131 mg/dL — ABNORMAL HIGH (ref 70–99)
Potassium: 4.1 mmol/L (ref 3.5–5.1)
Sodium: 138 mmol/L (ref 135–145)

## 2023-08-28 LAB — URINE CULTURE: Culture: NO GROWTH

## 2023-08-28 LAB — UREA NITROGEN, URINE: Urea Nitrogen, Ur: 450 mg/dL

## 2023-08-28 MED ORDER — EMPAGLIFLOZIN 10 MG PO TABS: 10.0000 mg | ORAL_TABLET | Freq: Every day | ORAL | Status: DC

## 2023-08-28 MED ORDER — EMPAGLIFLOZIN 10 MG PO TABS
10.0000 mg | ORAL_TABLET | Freq: Every day | ORAL | Status: DC
  Administered 2023-08-28 – 2023-08-29 (×2): 10 mg via ORAL
  Filled 2023-08-28 (×2): qty 1

## 2023-08-28 NOTE — Progress Notes (Signed)
 Jerry Sink, MD [facility]   [address], [address] [place], [place] [address] Phone: [phone] Fax : [phone]   Subjective: The patient states he is feeling well today but is being because he was told his renal function needs to be followed.  The patient has had no sign of any GI bleeding and reports that he is constipated.  The patient's hemoglobin has been relatively stable over the last few days.    Objective: Vital signs in last 24 hours: Vitals:   08/27/23 1947 08/28/23 0334 08/28/23 0334 08/28/23 0823  BP: (!) 158/68  (!) 141/58 (!) 162/89  Pulse: (!) 54  (!) 52 (!) 50  Resp: 18  18 18   Temp: 98.7 F (37.1 C)  98.2 F (36.8 C) (!) 97.5 F (36.4 C)  TempSrc: Oral  Oral Oral  SpO2: 96%  97% 97%  Weight:  86.5 kg    Height:       Weight change: -7.1 kg  Intake/Output Summary (Last 24 hours) at 08/28/2023 1315 Last data filed at 08/27/2023 1424 Gross per 24 hour  Intake 240 ml  Output --  Net 240 ml     Exam: General: Patient sitting up in a chair in no apparent distress alert and oriented x 3  Lab Results: @LABTEST2 @ Micro Results: Recent Results (from the past 240 hours)  Body fluid culture w Gram Stain     Status: None   Collection Time: 08/23/23 12:10 PM   Specimen: A: PATH Cytology Pleural fluid   B: PATH Cytology Pleural fluid  Result Value Ref Range Status   Specimen Description   Final    PLEURAL Performed at [facility], [address], [place], [place] [address]    Special Requests   Final    PLEURAL Performed at [facility], [address], [place], [place] [address]    Gram Stain   Final    WBC PRESENT,BOTH PMN AND MONONUCLEAR NO ORGANISMS SEEN CYTOSPIN SMEAR    Culture   Final    NO GROWTH 3 DAYS Performed at [facility], [address] [place], [place] [address]    Report Status 08/27/2023 FINAL  Final   Studies/Results: No results found. Medications: I have reviewed the patient's current  medications. Scheduled Meds:  empagliflozin   10 mg Oral Daily   hydrALAZINE   50 mg Oral TID   labetalol   100 mg Oral BID   pantoprazole  (PROTONIX ) IV  40 mg Intravenous Q12H   rosuvastatin   40 mg Oral Daily   sodium chloride  flush  3 mL Intravenous Q12H   torsemide   20 mg Oral BID   Continuous Infusions: PRN Meds:.hydrALAZINE , ondansetron  **OR** ondansetron  (ZOFRAN ) IV, sodium chloride  flush   Assessment: Principal Problem:   CHF exacerbation (HCC) Active Problems:   Essential hypertension   OSA (obstructive sleep apnea)   CAD (coronary artery disease), native coronary artery   Hyperlipidemia associated with type 2 diabetes mellitus (HCC)   Acute blood loss anemia   Acute heart failure with preserved ejection fraction (HFpEF) (HCC)   Paroxysmal atrial fibrillation (HCC)   Acute kidney injury superimposed on chronic kidney disease (HCC)   Upper GI bleeding   Pleural effusion   Symptomatic anemia   Cirrhosis of liver with ascites (HCC)   Melena    Plan: This patient came in with black stools and anemia with an upper endoscopy showing multiple gastric polyps as had been previously seen without any active bleeding.  The patient should  follow-up in our office for further workup of his liver cirrhosis.  The patient's ANA, SMA, AMA and acute hepatitis panel were negative for a cause of his liver disease as was his ceruloplasmin and alpha 1 trypsin.  Nothing further to do on his at this time.  I will sign off.  Please call if any further GI concerns or questions.  We would like to thank you for the opportunity to participate in the care of [name].    LOS: 6 days   Jerry Sink, MD.FACG 08/28/2023, 1:15 PM Pager [phone] 7am-5pm  Check AMION for 5pm -7am coverage and on weekends

## 2023-08-28 NOTE — Anesthesia Postprocedure Evaluation (Signed)
 Anesthesia Post Note  Patient: [name]  Procedure(s) Performed: ESOPHAGOGASTRODUODENOSCOPY (EGD) WITH PROPOFOL   Patient location during evaluation: PACU Anesthesia Type: General Level of consciousness: awake and alert Pain management: pain level controlled Vital Signs Assessment: post-procedure vital signs reviewed and stable Respiratory status: spontaneous breathing, nonlabored ventilation, respiratory function stable and patient connected to nasal cannula oxygen Cardiovascular status: blood pressure returned to baseline and stable Postop Assessment: no apparent nausea or vomiting Anesthetic complications: no   No notable events documented.   Last Vitals:  Vitals:   08/28/23 0334 08/28/23 0823  BP: (!) 141/58 (!) 162/89  Pulse: (!) 52 (!) 50  Resp: 18 18  Temp: 36.8 C (!) 36.4 C  SpO2: 97% 97%    Last Pain:  Vitals:   08/28/23 1300  TempSrc:   PainSc: 0-No pain                 Zula Hitch

## 2023-08-28 NOTE — Progress Notes (Signed)
 Heart Failure Stewardship Pharmacy Note  PCP: Westley Hammers, MD PCP-Cardiologist: None  HPI: [name] is a 78 y.o. male with CAD s/p CABG (LIMA-LAD, SVG - LCX 100% occluded, SVG - PDA s/p PCI (2021) with prior stents, paroxysmal AF (not anticoagulated), HFpEF (severe LVH, mild AS, moderate AI, moderate to severe MR, moderate TR by echo 09/2022) DM2, CKD, OSA who presented with shortness of breath, dyspnea on exertion, and anasarca that has progressed over the past 2-3 months. Reports symptoms began months ago after stopping torsemide . Diuretics were restarted every other day at his cardiology visit, then recently increased on 08/20/23. BNP on admission was 1069.2. HS troponin on admission was 23 >24. Iron  labs meet HF replacement criteria and he has infusions scheduled for the next 2 Mondays. CXR showed bilateral pleural effusions, pulmonary vascular congestion, and pulmonary edema. Upper abdominal ascites present on abdominal ultrasound. Thoracentesis yielded 1L. GI planning EGD to evaluate upper GI bleed.  Pertinent Lab Values: Creatinine  Date Value Ref Range Status  08/04/2012 1.03 0.60 - 1.30 mg/dL Final   Creatinine, Ser  Date Value Ref Range Status  08/28/2023 2.49 (H) 0.61 - 1.24 mg/dL Final   BUN  Date Value Ref Range Status  08/28/2023 38 (H) 8 - 23 mg/dL Final  60/45/4098 17 7 - 18 mg/dL Final   Potassium  Date Value Ref Range Status  08/28/2023 4.1 3.5 - 5.1 mmol/L Final  08/04/2012 4.1 3.5 - 5.1 mmol/L Final   Sodium  Date Value Ref Range Status  08/28/2023 138 135 - 145 mmol/L Final  08/04/2012 139 136 - 145 mmol/L Final   B Natriuretic Peptide  Date Value Ref Range Status  08/22/2023 1,069.2 (H) 0.0 - 100.0 pg/mL Final    Comment:    Performed at [facility], [address] [place], [place] [address]   Magnesium   Date Value Ref Range Status  12/27/2022 2.3 1.7 - 2.4 mg/dL Final    Comment:    Performed at [facility], [address] [place], [place] [address]   Hgb A1c MFr Bld  Date Value Ref Range Status  12/21/2022 5.8 (H) 4.8 - 5.6 % Final    Comment:    (NOTE) Pre diabetes:          5.7%-6.4%  Diabetes:              >6.4%  Glycemic control for   <7.0% adults with diabetes    TSH  Date Value Ref Range Status  09/03/2020 3.235 0.350 - 4.500 uIU/mL Final    Comment:    Performed by a 3rd Generation assay with a functional sensitivity of <=0.01 uIU/mL. Performed at [facility] [place] [facility], [address] [place], [place] [address]     Vital Signs: Admission weight: 218.2 lbs Temp:  [98 F (36.7 C)-98.8 F (37.1 C)] 98.2 F (36.8 C) (01/15 0334) Pulse Rate:  [52-58] 52 (01/15 0334) Resp:  [18] 18 (01/15 0334) BP: (141-161)/(58-95) 141/58 (01/15 0334) SpO2:  [93 %-97 %] 97 % (01/15 0334) Weight:  [86.5 kg (190 lb 11.2 oz)] 86.5 kg (190 lb 11.2 oz) (01/15 0334)  Intake/Output Summary (Last 24 hours) at 08/28/2023 0729 Last data filed at 08/27/2023 1424 Gross per 24 hour  Intake 240 ml  Output --  Net 240 ml   Current Heart Failure Medications:  Loop diuretic: furosemide  60 mg IV BID Beta-Blocker: labetalol  100 mg BID ACEI/ARB/ARNI: none MRA: none SGLT2i: none  Other: hydralazine  50 mg TID  Prior to admission Heart Failure Medications:  Loop diuretic: torsemide  20 mg daily (recently increased) Beta-Blocker: labetalol  100 mg BID ACEI/ARB/ARNI: none MRA: none SGLT2i: none Other vasoactive medications include amlodipine  10 mg daily  Assessment: 1. Acute on chronic diastolic heart failure (LVEF in 12/2022 60-65%) and new echo pending, due to ICM vs mixed etiology. NYHA class IV symptoms.  -Symptoms: Reports shortness of breath, DOE, and LEE have all improved. Feeling much improved since admission. -Volume: Agree with restarting diuretics. Creatinine is improved today. Patient reports good urine output. Wt is down significantly from admission. -Hemodynamics: BP is elevated ~140-160s/80s.  HR is stable -SGLT2i: eGFR is 26, consider adding SGLT2i as first line treatment of HFpEF, CKD, and DM. Jardiance  has slightly better data with lower eGFRs.  -MRA: Will not likely be a candidate for MRAs due to renal function. Could consider Kerendia if creatinine improved given indication for diabetic nephropathy treatment. -ARNI: Can consider Entresto given hypertension if creatinine stabilizes <2 due for prevention of CKD progression in DM and HFpEF. Not currently a candidate.  -BB is indicated on the basis of CAD, however diastolic HF does not tolerate low HR well. Can consider reducing labetalol  and adding afterload reduction  Plan: 1) Medication changes recommended at this time: -Consider increasing hydralazine  to 75 mg TID for HTN -Consider adding Jardiance  10 mg daily  2) Patient assistance: -Eliquis  copay is $86.93, Xarelto copay is $86.93, Entresto copay is $86.93, Elvina Hammers copay is $86.93, Jardiance  copay is $86.93. Copays high due to a $25.00 deductible  -Patinet has been approved for the [employer] grant: Card No. [id number], BIN [id number], PCN [id number], Group [id number]  3) Education: - Patient has been educated on current HF medications and potential additions to HF medication regimen - Patient verbalizes understanding that over the next few months, these medication doses may change and more medications may be added to optimize HF regimen - Patient has been educated on basic disease state pathophysiology and goals of therapy  Medication Assistance / Insurance Benefits Check: Does the patient have prescription insurance?    Type of insurance plan:  Does the patient qualify for medication assistance through manufacturers or grants? Pending  Eligible grants and/or patient assistance programs: pending  Medication assistance applications in progress: pending  Medication assistance applications approved: pending Approved medication assistance renewals will be completed by:  pending.  Outpatient Pharmacy: Prior to admission outpatient pharmacy: [facility]     Please do not hesitate to reach out with questions or concerns,  Bevely Brush, PharmD, CPP, BCPS Heart Failure Pharmacist  Phone - [phone] 08/28/2023 7:29 AM

## 2023-08-28 NOTE — Progress Notes (Signed)
 Progress Note   Patient: [name] GNF:[id number] DOB: 06/12/1946 DOA: 08/22/2023     6 DOS: the patient was seen and examined on 08/28/2023   Brief hospital course:  HPI: [name] is a 78 y.o. male with medical history significant of CAD s/p CABG and DES, severe pulmonary hypertension, hypertension, hyperlipidemia, OSA, type 2 diabetes, hypothyroidism, paroxysmal Afib, HFpEF (severe LVH, mild AS, moderate AI, moderate to severe MR, moderate TR by echo 09/2022). To ED 08/22/23 c/o worsening fatigue over the past 3-4+ months, worsening orthopnea, PND, LE swelling.  Was evaluated by GI on 08/20/2023 - concern for upper GI bleeding. Baseline hgb 10. Hgb 8 during that evaluation, recs for EGD pending cardiology evaulation in setting of HFpEF etc. Pt states that stools are chronically dark, more dark over the past several weeks.    01/09: admitted to hospitalist service w/ acute on chronic HFpEF, upper GIB, ABLA (Hgb 6.9), AKI on CKD (Cr 2.5, baseline around 1.8). Also of note - moderate R and large L pleural effusion on CT imaging 08/19/2022 in setting of acute on chronic HFpEF. Per GI - holding any endoscopic eval/tx for now pending cardio eval / improvement in fluids status. Per cardiology - diurese, no diagnostics. IR consult for thoracentesis 01/10: AKI no improvement (Cr 2.63), Hgb improved to 7.5. Echo EF 60-65% and normal diastolic parameters. Per GI - reassess pending improvement in cardiopulm status. Per cardiology - continue diuresis. Total UOP approx 3L. R thoracentesis yield 1L fluid, more was present but only 1L removed.  01/11: Cr going up, holding diuresis.   01/12: Cr up to 3.01, Hgb to 7.3 question hypoperfusion - will follow FeNa and UA, 1 unit PRBC. EGD tm 01/13:  EGD stblae gastric polyps no bleeding. AKI seems to be improved after PRBC transfusion and holding lasix   01/14: renal function not improved, Hgb 7.9 no active bleeding. Will monitor labs into tomorrow - resume diuresis  per cardiology vs may need to have nephrology assess, hold off on PRBC transfusion now and monitor if still dropping may need further GI w/u 01/15 : Renal function is stable.  Diuretics resumed   Consultants:  Gastroenterology Cardiology  IR   Procedures/Surgeries: 08/23/23 R thoracentesis       Assessment and Plan:  Upper GI bleeding ABLA PRBC transfusion x 2 units since this hospitalization.  Hemoglobin is 7.8 EGD 01/13 stable gastric polyps, no current GI bleed IV PPI bid GI following Hold anticoagulation, antiplatelet, NSAID - likely will have to hold antiplatelet/anticoag indefinitely  Trend CBC/HH  page GI with any acute hemodynamic changes, or signs of active GI bleeding  hold off on PRBC transfusion now and monitor if still dropping may need further GI w/u   Acute kidney injury superimposed on chronic kidney disease stage 3a ? Component of cardiorenal syndrome but not really improved w/ diuresis, seems to be improved after PRBC transfusion and holding lasix   Renal function is stable Diuretics have been resumed by cardiology   Acute on chronic heart failure with preserved ejection fraction (HFpEF)  2D ECHO 12/2022 w/ EF 60-65%  Echo this admission also EF 60-65% and normal diastolic w/ mod-severe MR, mod-severe TR, mod AI, may be contributing to symptoms diuresis held past 2 days due to AKI and resumed 01/15 today  Cardiology consult - no invasive diagnostics or stress test at this time  Strict Is and Os and daily weights   Continue beta blocker  holding ASA d/t GIB holding ACE/ARB d/t AKI Consider  jardiance  if renal function improves resume diuresis per cardiology vs may need to have nephrology assess,   CAD (coronary artery disease), native coronary artery hold antiplatelet regimen in setting of concern for upper GI bleeding  Other cardiac meds as above     Pleural effusion moderate on R large on L  S/p R thoracentesis 01/10 May need to repeat d/t residual  fluid, see procedure note     Paroxysmal atrial fibrillation  Rate controlled  Hold eliquis  in setting of concern for upper GIB  Labetalol  resumed per cardiology, slightly low HR will add holding parameters for bradycardia    Hypokalemia Replace as needed Monitor BMP   Hyperlipidemia associated with CAD, DM2 statin    OSA (obstructive sleep apnea) CPAP qhs    Essential hypertension Monitor w/ diuresis              overweight based on BMI: Body mass index is 25.16 kg/m.  Underweight - under 18  overweight - 25 to 29 obese - 30 or more Class 1 obesity: BMI of 30.0 to 34 Class 2 obesity: BMI of 35.0 to 39 Class 3 obesity: BMI of 40.0 to 49 Super Morbid Obesity: BMI 50-59 Super-super Morbid Obesity: BMI 60+ Significantly low or high BMI is associated with higher medical risk.  Weight management advised as adjunct to other disease management and risk reduction treatments       DVT prophylaxis: SCD IV fluids: no continuous IV fluids d/t concern for CHF Nutrition: cardiac diet now  Central lines / invasive devices: none   Code Status: FULL CODE ACP documentation reviewed:  none on file in [facility]          Subjective: No new complaints  Physical Exam: Vitals:   08/27/23 1947 08/28/23 0334 08/28/23 0334 08/28/23 0823  BP: (!) 158/68  (!) 141/58 (!) 162/89  Pulse: (!) 54  (!) 52 (!) 50  Resp: 18  18 18   Temp: 98.7 F (37.1 C)  98.2 F (36.8 C) (!) 97.5 F (36.4 C)  TempSrc: Oral  Oral Oral  SpO2: 96%  97% 97%  Weight:  86.5 kg    Height:       General: Well developed, well nourished, in no acute distress HEENT:  Normocephalic and atramatic Neck:   No JVD.  Lungs: Clear bilaterally to auscultation and percussion. Heart: Irregular irregular bradycardic. Normal S1 and S2 without gallops or  3/6 sem murmurs.  Abdomen: Bowel sounds are positive, abdomen soft and non-tender  Msk:  Back normal, normal gait. Normal strength and tone for age. Extremities: No  clubbing, cyanosis or 2+edema.  (Lt > Rt) Neuro: Alert and oriented X 3. Psych:  Good affect, responds appropriately    Data Reviewed: Labs reviewed There are no new results to review at this time.  Family Communication: Plan of care discussed with patient in detail  Disposition: Status is: Inpatient Remains inpatient appropriate because: Monitor renal function closely  Planned Discharge Destination: Home    Time spent: 33 minutes  Author: Read Camel, MD 08/28/2023 12:07 PM  For on call review [email or web address].

## 2023-08-29 ENCOUNTER — Encounter: Payer: Self-pay | Admitting: Oncology

## 2023-08-29 ENCOUNTER — Other Ambulatory Visit: Payer: Self-pay

## 2023-08-29 DIAGNOSIS — I5031 Acute diastolic (congestive) heart failure: Secondary | ICD-10-CM

## 2023-08-29 LAB — CBC
HCT: 26 % — ABNORMAL LOW (ref 39.0–52.0)
Hemoglobin: 8.3 g/dL — ABNORMAL LOW (ref 13.0–17.0)
MCH: 32.2 pg (ref 26.0–34.0)
MCHC: 31.9 g/dL (ref 30.0–36.0)
MCV: 100.8 fL — ABNORMAL HIGH (ref 80.0–100.0)
Platelets: 94 10*3/uL — ABNORMAL LOW (ref 150–400)
RBC: 2.58 MIL/uL — ABNORMAL LOW (ref 4.22–5.81)
RDW: 17.3 % — ABNORMAL HIGH (ref 11.5–15.5)
WBC: 4.9 10*3/uL (ref 4.0–10.5)
nRBC: 0 % (ref 0.0–0.2)

## 2023-08-29 LAB — TYPE AND SCREEN
ABO/RH(D): A POS
Antibody Screen: NEGATIVE
Unit division: 0
Unit division: 0

## 2023-08-29 LAB — BPAM RBC
Blood Product Expiration Date: 202502102359
Blood Product Expiration Date: 202502102359
ISSUE DATE / TIME: 202501091345
ISSUE DATE / TIME: 202501121749
Unit Type and Rh: 6200
Unit Type and Rh: 6200

## 2023-08-29 LAB — BASIC METABOLIC PANEL
Anion gap: 8 (ref 5–15)
BUN: 40 mg/dL — ABNORMAL HIGH (ref 8–23)
CO2: 26 mmol/L (ref 22–32)
Calcium: 9.8 mg/dL (ref 8.9–10.3)
Chloride: 100 mmol/L (ref 98–111)
Creatinine, Ser: 2.67 mg/dL — ABNORMAL HIGH (ref 0.61–1.24)
GFR, Estimated: 24 mL/min — ABNORMAL LOW (ref >60)
Glucose, Bld: 156 mg/dL — ABNORMAL HIGH (ref 70–99)
Potassium: 4.2 mmol/L (ref 3.5–5.1)
Sodium: 134 mmol/L — ABNORMAL LOW (ref 135–145)

## 2023-08-29 LAB — ANTI-SMOOTH MUSCLE ANTIBODY, IGG: F-Actin IgG: 12 U (ref 0–19)

## 2023-08-29 MED ORDER — TORSEMIDE 20 MG PO TABS
40.0000 mg | ORAL_TABLET | Freq: Every day | ORAL | 0 refills | Status: DC
  Filled 2023-08-29: qty 60, 30d supply, fill #0

## 2023-08-29 MED ORDER — EMPAGLIFLOZIN 10 MG PO TABS
10.0000 mg | ORAL_TABLET | Freq: Every day | ORAL | 0 refills | Status: DC
  Filled 2023-08-29: qty 30, 30d supply, fill #0

## 2023-08-29 NOTE — Progress Notes (Addendum)
[facility]  ROUNDING NOTE   Subjective:   [name] is a 78 year old male with past medical conditions including atrial fibrillation, anemia, hypertension, hyperlipidemia, liver cirrhosis, and chronic kidney disease stage IIIb.  Patient presents to the emergency department for shortness of breath was admitted for Pleural effusion [J90] Status post thoracentesis [Z98.890] CHF exacerbation (HCC) [I50.9] Symptomatic anemia [D64.9]  Patient is known to our practice and is followed outpatient by Dr. Suezanne Jacquet.  Patient was last seen in the office on 08/19/2023, for routine follow-up.  Patient states he has been seen recently in the office by his cardiologist and nephrologist due to shortness of breath.  Patient states he was taking his torsemide as needed, then cardiology requested he take every other day.  Due to lack of relief, nephrology requested he take his torsemide daily.  During this admission patient has received diuresis and is currently prescribed torsemide 20 mg twice daily.  Patient is seen sitting up in chair, room air.  Patient does have an extensive amount of lower extremity edema remaining however he states this is better than baseline.  Patient's creatinine has been as low as 1.81 with GFR 39 on 05/01/2023.  It appears creatinine has risen some outpatient, 2.83 in December to 2.42 in January.  Creatinine on admission 2.48 and has peaked during this admission to 3.01.  Patient continues to have adequate urine output.Chest x-ray on admission showed bilateral pleural effusions with interstitial edema.  Echo from this admission shows EF 60 to 65% with a mild to moderate MVR and aortic valve stenosis.  Patient also found to have upper GI bleed.  We have been consulted to evaluate acute kidney injury.  Objective:  Vital signs in last 24 hours:  Temp:  [97.4 F (36.3 C)-98 F (36.7 C)] 97.4 F (36.3 C) (01/16 0743) Pulse Rate:  [52-60] 52 (01/16 0743) Resp:  [16-18] 18  (01/16 0743) BP: (143-157)/(69-88) 151/88 (01/16 0743) SpO2:  [95 %-97 %] 95 % (01/16 0743) Weight:  [94.1 kg] 94.1 kg (01/16 0334)  Weight change: 7.6 kg Filed Weights   08/27/23 0500 08/28/23 0334 08/29/23 0334  Weight: 94 kg 86.5 kg 94.1 kg    Intake/Output: I/O last 3 completed shifts: In: 250 [P.O.:240; I.V.:10] Out: -    Intake/Output this shift:  No intake/output data recorded.  Physical Exam: General: NAD  Head: Normocephalic, atraumatic. Moist oral mucosal membranes  Eyes: Anicteric  Lungs:  Clear to auscultation  Heart: Regular rate and rhythm  Abdomen:  Soft, nontender, nondistended  Extremities: 2+ peripheral edema.  Neurologic: Alert and oriented, moving all four extremities  Skin: No lesions       Basic Metabolic Panel: Recent Labs  Lab 08/25/23 0814 08/26/23 0440 08/27/23 0441 08/28/23 0438 08/29/23 0435  NA 137 139 138 138 134*  K 3.3* 4.1 4.2 4.1 4.2  CL 103 103 104 103 100  CO2 25 26 26 25 26   GLUCOSE 135* 141* 141* 131* 156*  BUN 42* 43* 41* 38* 40*  CREATININE 3.01* 2.79* 2.82* 2.49* 2.67*  CALCIUM 9.7 9.6 9.4 9.3 9.8    Liver Function Tests: Recent Labs  Lab 08/23/23 0452  AST 18  ALT 17  ALKPHOS 85  BILITOT 1.5*  PROT 6.9  ALBUMIN 4.1   No results for input(s): "LIPASE", "AMYLASE" in the last 168 hours. No results for input(s): "AMMONIA" in the last 168 hours.  CBC: Recent Labs  Lab 08/25/23 0547 08/26/23 0440 08/27/23 0441 08/27/23 1857 08/28/23 0438 08/29/23  0435  WBC 4.6 5.1 4.4  --  5.0 4.9  HGB 7.3* 8.3* 7.9* 8.2* 7.8* 8.3*  HCT 22.8* 25.2* 24.5* 25.5* 23.9* 26.0*  MCV 102.7* 100.4* 100.8*  --  100.0 100.8*  PLT 91* 89* 84*  --  85* 94*    Cardiac Enzymes: No results for input(s): "CKTOTAL", "CKMB", "CKMBINDEX", "TROPONINI" in the last 168 hours.  BNP: Invalid input(s): "POCBNP"  CBG: Recent Labs  Lab 08/26/23 1217  GLUCAP 110*    Microbiology: Results for orders placed or performed during the  hospital encounter of 08/22/23  Body fluid culture w Gram Stain     Status: None   Collection Time: 08/23/23 12:10 PM   Specimen: A: PATH Cytology Pleural fluid   B: PATH Cytology Pleural fluid  Result Value Ref Range Status   Specimen Description   Final    PLEURAL Performed at [facility], [address], [place], [place] [address]    Special Requests   Final    PLEURAL Performed at [facility], [address], [place], [place] [address]    Gram Stain   Final    WBC PRESENT,BOTH PMN AND MONONUCLEAR NO ORGANISMS SEEN CYTOSPIN SMEAR    Culture   Final    NO GROWTH 3 DAYS Performed at [facility], [address] [place], [place] [address]    Report Status 08/27/2023 FINAL  Final  Urine Culture     Status: None   Collection Time: 08/27/23 10:00 AM   Specimen: Urine, Random  Result Value Ref Range Status   Specimen Description   Final    URINE, RANDOM Performed at [facility], [address], [place], [place] [address]    Special Requests   Final    NONE Reflexed from [id number] Performed at [facility], [address] [place], [place] [address]    Culture   Final    NO GROWTH Performed at [facility], [address], [place], [place] [address]    Report Status 08/28/2023 FINAL  Final    Coagulation Studies: No results for input(s): "LABPROT", "INR" in the last 72 hours.  Urinalysis: Recent Labs    08/27/23 1000  COLORURINE YELLOW*  LABSPEC 1.011  PHURINE 5.0  GLUCOSEU NEGATIVE  HGBUR SMALL*  BILIRUBINUR NEGATIVE  KETONESUR NEGATIVE  PROTEINUR 100*  NITRITE NEGATIVE  LEUKOCYTESUR MODERATE*      Imaging: No results found.   Medications:     empagliflozin  10 mg Oral Daily   hydrALAZINE  50 mg Oral TID   labetalol  100 mg Oral BID   pantoprazole (PROTONIX) IV  40 mg Intravenous Q12H   rosuvastatin  40 mg Oral Daily   sodium chloride flush  3 mL Intravenous Q12H   torsemide  20 mg Oral BID    hydrALAZINE, ondansetron **OR** ondansetron (ZOFRAN) IV, sodium chloride flush  Assessment/ Plan:  [name] is a 78 y.o.  male with past medical conditions including atrial fibrillation, anemia, hypertension, hyperlipidemia, liver cirrhosis, and chronic kidney disease stage IIIb.  Patient presents to the emergency department for shortness of breath was admitted for Pleural effusion [J90] Status post thoracentesis [Z98.890] CHF exacerbation (HCC) [I50.9] Symptomatic anemia [D64.9]    Acute Kidney Injury on chronic kidney disease stage 3B with baseline creatinine 1.81 and GFR of 39 on 05/01/2023.  Acute kidney injury secondary to volume overload with aggressive diuresis Increased diuretic use prior to admission.  Creatinine peaked at 3.01  during this admission.  Due to increase diuretics, this may represent new baseline for this patient.  Patient currently on room air.  Reports improved lower extremity edema.  Will recommend to continue torsemide 40 mg daily, patient request.  Patient will require close follow-up in our office in 1 week.  Lab Results  Component Value Date   CREATININE 2.67 (H) 08/29/2023   CREATININE 2.49 (H) 08/28/2023   CREATININE 2.82 (H) 08/27/2023    Intake/Output Summary (Last 24 hours) at 08/29/2023 1231 Last data filed at 08/28/2023 2058 Gross per 24 hour  Intake 250 ml  Output --  Net 250 ml    2. Anemia of chronic kidney disease with acute blood loss Lab Results  Component Value Date   HGB 8.3 (L) 08/29/2023    Hemoglobin below desired range.  Patient has also been treated for an upper GI bleed during this admission.  Will continue to monitor for now.  3.  Hypertension with chronic kidney disease.  Currently receiving hydralazine, labetalol, and torsemide.  Will recommend torsemide 40 mg daily, at patient request.     LOS: 7 Anthonio Mizzell 1/16/202512:31 PM

## 2023-08-29 NOTE — Progress Notes (Signed)
Heart Failure Stewardship Pharmacy Note  PCP: Jaclyn Shaggy, MD PCP-Cardiologist: None  HPI: [name] is a 78 y.o. male with CAD s/p CABG (LIMA-LAD, SVG - LCX 100% occluded, SVG - PDA s/p PCI (2021) with prior stents, paroxysmal AF (not anticoagulated), HFpEF (severe LVH, mild AS, moderate AI, moderate to severe MR, moderate TR by echo 09/2022) DM2, CKD, OSA who presented with shortness of breath, dyspnea on exertion, and anasarca that has progressed over the past 2-3 months. Reports symptoms began months ago after stopping torsemide. Diuretics were restarted every other day at his cardiology visit, then recently increased on 08/20/23. BNP on admission was 1069.2. HS troponin on admission was 23 >24. Iron labs meet HF replacement criteria and he has infusions scheduled for the next 2 Mondays. CXR showed bilateral pleural effusions, pulmonary vascular congestion, and pulmonary edema. Upper abdominal ascites present on abdominal ultrasound. Thoracentesis yielded 1L. GI planning EGD to evaluate upper GI bleed.  Pertinent Lab Values: Creatinine  Date Value Ref Range Status  08/04/2012 1.03 0.60 - 1.30 mg/dL Final   Creatinine, Ser  Date Value Ref Range Status  08/29/2023 2.67 (H) 0.61 - 1.24 mg/dL Final   BUN  Date Value Ref Range Status  08/29/2023 40 (H) 8 - 23 mg/dL Final  29/56/2130 17 7 - 18 mg/dL Final   Potassium  Date Value Ref Range Status  08/29/2023 4.2 3.5 - 5.1 mmol/L Final  08/04/2012 4.1 3.5 - 5.1 mmol/L Final   Sodium  Date Value Ref Range Status  08/29/2023 134 (L) 135 - 145 mmol/L Final  08/04/2012 139 136 - 145 mmol/L Final   B Natriuretic Peptide  Date Value Ref Range Status  08/22/2023 1,069.2 (H) 0.0 - 100.0 pg/mL Final    Comment:    Performed at [facility], [address] [place], [place] [address]   Magnesium  Date Value Ref Range Status  12/27/2022 2.3 1.7 - 2.4 mg/dL Final    Comment:    Performed at [facility], [address] [place], [place] [address]   Hgb A1c MFr Bld  Date Value Ref Range Status  12/21/2022 5.8 (H) 4.8 - 5.6 % Final    Comment:    (NOTE) Pre diabetes:          5.7%-6.4%  Diabetes:              >6.4%  Glycemic control for   <7.0% adults with diabetes    TSH  Date Value Ref Range Status  09/03/2020 3.235 0.350 - 4.500 uIU/mL Final    Comment:    Performed by a 3rd Generation assay with a functional sensitivity of <=0.01 uIU/mL. Performed at [facility], [address] [place], [place] [address]     Vital Signs: Admission weight: 218.2 lbs Temp:  [97.5 F (36.4 C)-98 F (36.7 C)] 98 F (36.7 C) (01/15 1952) Pulse Rate:  [50-60] 58 (01/15 2056) Resp:  [16-18] 18 (01/15 1952) BP: (143-162)/(69-89) 143/69 (01/15 1952) SpO2:  [96 %-97 %] 96 % (01/15 1952) Weight:  [94.1 kg (207 lb 7.3 oz)] 94.1 kg (207 lb 7.3 oz) (01/16 0334)  Intake/Output Summary (Last 24 hours) at 08/29/2023 0721 Last data filed at 08/28/2023 2058 Gross per 24 hour  Intake 250 ml  Output --  Net 250 ml   Current Heart Failure Medications:  Loop diuretic: torsemide 20 mg BID Beta-Blocker: labetalol 100 mg BID ACEI/ARB/ARNI: none MRA: none SGLT2i: Jardiance 10 mg daily  Other: hydralazine  50 mg TID  Prior to admission Heart Failure Medications:  Loop diuretic: torsemide 20 mg daily (recently increased) Beta-Blocker: labetalol 100 mg BID ACEI/ARB/ARNI: none MRA: none SGLT2i: none Other vasoactive medications include amlodipine 10 mg daily  Assessment: 1. Acute on chronic diastolic heart failure (LVEF in 12/2022 60-65%) and new echo pending, due to ICM vs mixed etiology. NYHA class IV symptoms.  -Symptoms: Reports shortness of breath, DOE, and LEE have all improved. Feeling much improved since admission. -Volume: Currently making excellent urine per patient report on torsemide 20 mg BID. Creatinine is slightly up today after restarting torsemide. Wt is up several lbs, unsure  of accuracy. Can consider decreasing torsemide back to 20 mg daily -Hemodynamics: BP is elevated ~140-160s/80s. HR is stable -SGLT2i: continue Jardiance 10 mg daily as first line treatment of HFpEF, CKD, and DM. Jardiance has slightly better data with lower eGFRs.  -MRA: Will not likely be a candidate for MRAs due to renal function. Could consider Chauncey Mann if creatinine improved given indication for diabetic nephropathy treatment. -ARNI: Not an excellent candidate for Entresto at this time due to renal function. -BB is indicated on the basis of CAD, however diastolic HF does not tolerate low HR well. Can consider reducing labetalol and adding afterload reduction  Plan: 1) Medication changes recommended at this time: -Consider increasing hydralazine to 75 mg TID for HTN -Consider decreasing torsemide to 20 mg daily and monitoring response. Not all urine output documented per patient.  2) Patient assistance: Patient Assistance    Flowsheet Row Most Recent Value  Medication Assistance 1 Grant applied     -Eliquis copay is $86.93, Xarelto copay is $86.93, Sherryll Burger copay is $86.93, Marcelline Deist copay is $86.93, Jardiance copay is $86.93. Copays high due to a $25.00 deductible  -Patinet has been approved for the [employer] grant: Card No. [id number], BIN [id number], PCN [id number], Group [id number]  3) Education: - Patient has been educated on current HF medications and potential additions to HF medication regimen - Patient verbalizes understanding that over the next few months, these medication doses may change and more medications may be added to optimize HF regimen - Patient has been educated on basic disease state pathophysiology and goals of therapy  Medication Assistance / Insurance Benefits Check: Does the patient have prescription insurance? Prescription Insurance: Medicare  Type of insurance plan:  Does the patient qualify for medication assistance through manufacturers or grants? Pending   Eligible grants and/or patient assistance programs: pending  Medication assistance applications in progress: pending  Medication assistance applications approved: pending Approved medication assistance renewals will be completed by: pending.  Outpatient Pharmacy: Prior to admission outpatient pharmacy: [facility]   Is the patient willing to utilize a [facility] pharmacy at discharge?: Yes Please do not hesitate to reach out with questions or concerns,  Enos Fling, PharmD, CPP, BCPS Heart Failure Pharmacist  Phone - [phone] 08/29/2023 7:21 AM

## 2023-08-29 NOTE — Discharge Summary (Addendum)
Physician Discharge Summary   Patient: [name] MRN: [id number] DOB: 1946-01-05  Admit date:     08/22/2023  Discharge date: 08/29/23  Discharge Physician: Elinore Shults   PCP: Jaclyn Shaggy, MD   Recommendations at discharge:   Keep scheduled follow-up appointment with nephrology and GI Check daily weight and contact your primary care provider if you gain more than 2 pounds in 24 hours or 5 pounds in 1 week  Discharge Diagnoses: Principal Problem:   Acute heart failure with preserved ejection fraction (HFpEF) (HCC) Active Problems:   Upper GI bleeding   CAD (coronary artery disease), native coronary artery   Acute blood loss anemia   Paroxysmal atrial fibrillation (HCC)   Acute kidney injury superimposed on chronic kidney disease (HCC)   Pleural effusion   Essential hypertension   OSA (obstructive sleep apnea)   Hyperlipidemia associated with type 2 diabetes mellitus (HCC)   Symptomatic anemia   Cirrhosis of liver with ascites (HCC)   Melena  Resolved Problems:   * No resolved hospital problems. *  Hospital Course:  HPI: [name] is a 78 y.o. male with medical history significant of CAD s/p CABG and DES, severe pulmonary hypertension, hypertension, hyperlipidemia, OSA, type 2 diabetes, hypothyroidism, paroxysmal Afib, HFpEF (severe LVH, mild AS, moderate AI, moderate to severe MR, moderate TR by echo 09/2022). To ED 08/22/23 c/o worsening fatigue over the past 3-4+ months, worsening orthopnea, PND, LE swelling.  Was evaluated by GI on 08/20/2023 - concern for upper GI bleeding. Baseline hgb 10. Hgb 8 during that evaluation, recs for EGD pending cardiology evaulation in setting of HFpEF etc. Pt states that stools are chronically dark, more dark over the past several weeks.    01/09: admitted to hospitalist service w/ acute on chronic HFpEF, upper GIB, ABLA (Hgb 6.9), AKI on CKD (Cr 2.5, baseline around 1.8). Also of note - moderate R and large L pleural effusion on  CT imaging 08/19/2022 in setting of acute on chronic HFpEF. Per GI - holding any endoscopic eval/tx for now pending cardio eval / improvement in fluids status. Per cardiology - diurese, no diagnostics. IR consult for thoracentesis 01/10: AKI no improvement (Cr 2.63), Hgb improved to 7.5. Echo EF 60-65% and normal diastolic parameters. Per GI - reassess pending improvement in cardiopulm status. Per cardiology - continue diuresis. Total UOP approx 3L. R thoracentesis yield 1L fluid, more was present but only 1L removed.  01/11: Cr going up, holding diuresis.   01/12: Cr up to 3.01, Hgb to 7.3 question hypoperfusion - will follow FeNa and UA, 1 unit PRBC. EGD tm 01/13:  EGD stblae gastric polyps no bleeding. AKI seems to be improved after PRBC transfusion and holding lasix  01/14: renal function not improved, Hgb 7.9 no active bleeding. Will monitor labs into tomorrow - resume diuresis per cardiology vs may need to have nephrology assess, hold off on PRBC transfusion now and monitor if still dropping may need further GI w/u 01/15 : Renal function is stable.  Diuretics resumed     Consultants:  Gastroenterology Cardiology  IR   Procedures/Surgeries: 08/23/23 R thoracentesis         Assessment and Plan:  Upper GI bleeding ABLA PRBC transfusion x 2 units since this hospitalization.  Hemoglobin on discharge is 8.3 EGD 01/13 stable gastric polyps, no current GI bleed Appreciate GI input Discussed with GI and ok for patient to be discharged on apixaban and Plavix    Acute kidney injury superimposed on  chronic kidney disease stage 3a ? Component of cardiorenal syndrome  No significant improvement in patient's renal function Baseline creatinine was 1.7 from 05/24 and on discharge his serum creatinine is 2.67 Appreciate nephrology input, patient will be discharged on torsemide 40 mg daily Follow-up with nephrology as an outpatient to monitor renal function    Acute on chronic heart failure  with preserved ejection fraction (HFpEF)  2D ECHO 12/2022 w/ EF 60-65%  Echo this admission also EF 60-65% and normal diastolic w/ mod-severe MR, mod-severe TR, mod AI, may be contributing to symptoms diuresis held for several days due to AKI and resumed 01/15 today  Cardiology consult - no invasive diagnostics or stress test at this time  Continue beta blocker  holding ACE/ARB d/t AKI Patient to be discharged on Jardiance    CAD (coronary artery disease), native coronary artery Continue Plavix    Pleural effusion moderate on R large on L  S/p R thoracentesis 01/10 May need to repeat d/t residual fluid, see procedure note     Paroxysmal atrial fibrillation  Rate controlled  Eliquis resumed per GI recommendation Labetalol resumed per cardiology, slightly low HR will add holding parameters for bradycardia    Hypokalemia Secondary to diuretic therapy Replace as needed Monitor BMP   Hyperlipidemia associated with CAD, DM2 statin    OSA (obstructive sleep apnea) CPAP qhs    Essential hypertension Blood pressure is stable   Diabetes mellitus Discontinue metformin due to worsening renal function Continue Januvia Maintain consistent carbohydrate diet    Liver cirrhosis Unclear etiology Endoscopy showed no evidence of varices Follow-up with GI as an outpatient for further evaluation  Consultants: Cardiology, GI, nephrology Procedures performed: Paracentesis, blood transfusion Disposition: Home Diet recommendation:  Discharge Diet Orders (From admission, onward)     Start     Ordered   08/29/23 0000  Diet - low sodium heart healthy        08/29/23 1139           Cardiac and Carb modified diet DISCHARGE MEDICATION: Allergies as of 08/29/2023       Reactions   Isosorbide Cough        Medication List     STOP taking these medications    metFORMIN 500 MG 24 hr tablet Commonly known as: GLUCOPHAGE-XR       TAKE these medications    acetaminophen  500 MG tablet Commonly known as: TYLENOL Take 2 tablets (1,000 mg total) by mouth every 6 (six) hours as needed for mild pain.   amLODipine 5 MG tablet Commonly known as: NORVASC Take 5 mg by mouth 2 (two) times daily.   apixaban 5 MG Tabs tablet Commonly known as: ELIQUIS Take 1 tablet (5 mg total) by mouth 2 (two) times daily.   clopidogrel 75 MG tablet Commonly known as: PLAVIX Take 75 mg by mouth daily.   cyanocobalamin 1000 MCG tablet Commonly known as: VITAMIN B12 Take 1,000 mcg by mouth daily.   dexlansoprazole 60 MG capsule Commonly known as: DEXILANT Take 1 capsule by mouth daily.   hydrALAZINE 50 MG tablet Commonly known as: APRESOLINE Take 1 tablet by mouth 3 (three) times daily.   Iron 325 (65 Fe) MG Tabs Take 1 tablet by mouth daily.   labetalol 100 MG tablet Commonly known as: NORMODYNE Take 1 tablet by mouth 2 (two) times daily.   nitroGLYCERIN 0.4 MG SL tablet Commonly known as: NITROSTAT Place 0.4 mg under the tongue every 5 (five) minutes as needed.  rosuvastatin 40 MG tablet Commonly known as: CRESTOR Take 40 mg by mouth daily.   sitaGLIPtin 100 MG tablet Commonly known as: JANUVIA Take 100 mg by mouth daily.   Testosterone 1.62 % Gel Apply 2 Pump topically daily. One pump on each arm   torsemide 20 MG tablet Commonly known as: DEMADEX Take 2 tablets (40 mg total) by mouth daily. What changed:  how much to take when to take this        Follow-up Information     Callwood, Gerda Diss D, MD. Go in 1 week(s).   Specialties: Cardiology, Internal Medicine Contact information: [address] [place] [address] [phone]         Mady Haagensen, MD Follow up in 1 week(s).   Specialty: Nephrology Contact information: [address] [place] [address] [phone]         Midge Minium, MD Follow up in 2 week(s).   Specialty: Gastroenterology Contact information: [place]  [place]  [address] [phone]                Discharge Exam: Ceasar Mons Weights   08/27/23 0500 08/28/23 0334 08/29/23 0334  Weight: 94 kg 86.5 kg 94.1 kg   General: Well developed, well nourished, in no acute distress HEENT:  Normocephalic and atramatic Neck:   No JVD.  Lungs: Clear bilaterally to auscultation and percussion. Heart: Irregular irregular bradycardic. Normal S1 and S2 without gallops or  3/6 sem murmurs.  Abdomen: Bowel sounds are positive, abdomen soft and non-tender  Msk:  Back normal, normal gait. Normal strength and tone for age. Extremities: No clubbing, cyanosis or 2+edema.  (Lt > Rt) Neuro: Alert and oriented X 3. Psych:  Good affect, responds appropriately      Condition at discharge: stable  The results of significant diagnostics from this hospitalization (including imaging, microbiology, ancillary and laboratory) are listed below for reference.   Imaging Studies: US LIVER DOPPLER Result Date: 08/24/2023 CLINICAL DATA:  Cirrhosis. EXAM: DUPLEX ULTRASOUND OF LIVER TECHNIQUE: Color and duplex Doppler ultrasound was performed to evaluate the hepatic in-flow and out-flow vessels. COMPARISON:  CT chest abdomen pelvis 08/20/2023 and abdominal ultrasound dated 08/06/2023 FINDINGS: Liver: Liver contour is mildly nodular. Liver parenchyma is mildly heterogeneous. No discrete liver lesion. Again noted are small echogenic foci along the gallbladder wall. These may represent small polyps and better characterized on the previous exam from 08/06/2023. Main Portal Vein size: 2.4 cm Portal Vein Velocities Main Prox:  39 cm/sec Main Mid: 37 cm/sec Main Dist:  36 cm/sec Right: 44 cm/sec Left: 43 cm/sec Hepatic Vein Velocities Right:  32 cm/sec Middle:  39 cm/sec Left:  33 cm/sec IVC: Present and patent with normal respiratory phasicity. Hepatic Artery Velocity:  41 cm/sec Splenic Vein Velocity:  32 cm/sec Spleen: 13.3 cm x 5.5 cm x 12.8 cm with a total volume of 487 cm^3 (411 cm^3 is  upper limit normal) Portal Vein Occlusion/Thrombus: No Splenic Vein Occlusion/Thrombus: No Ascites: Present Varices: None Bilateral pleural effusions. Normal hepatopetal flow in the portal veins. Normal hepatofugal flow in the hepatic veins. Small amount of ascites around the spleen. IMPRESSION: 1. Patent portal vein with normal direction of flow. 2. Liver contour is mildly nodular and concerning for cirrhosis. 3. Mild splenomegaly. 4. Bilateral pleural effusions.  Small amount of ascites. Electronically Signed   By: Richarda Overlie M.D.   On: 08/24/2023 10:19   US THORACENTESIS ASP PLEURAL SPACE W/IMG GUIDE Result Date: 08/23/2023 INDICATION: Patient is  a 78 y/o male with history of CAD- s/p CABG, pulmonary HTN, HTN, OSA, type 2 diabetes mellitus, hypothyroidism, heart failure, anemia, and kidney disease. Patient presents for a diagnostic and therapeutic thoracentesis due to bilateral pleural effusions. EXAM: ULTRASOUND GUIDED right sided THORACENTESIS MEDICATIONS: 10mL lidocaine 1% COMPLICATIONS: None immediate. PROCEDURE: An ultrasound guided thoracentesis was thoroughly discussed with the patient and questions answered. The benefits, risks, alternatives and complications were also discussed. The patient understands and wishes to proceed with the procedure. Written consent was obtained. Ultrasound was performed to localize and mark an adequate pocket of fluid in the right chest. The area was then prepped and draped in the normal sterile fashion. 1% Lidocaine was used for local anesthesia. Under ultrasound guidance a 6 Fr Safe-T-Centesis catheter was introduced. Thoracentesis was performed. The catheter was removed and a dressing applied. FINDINGS: A total of approximately 1L of amber colored fluid was removed. Samples were sent to the laboratory as requested by the clinical team. IMPRESSION: Successful ultrasound guided right thoracentesis yielding 1L of pleural fluid. Performed by Philipp Ovens PA-C  Electronically Signed   By: Malachy Moan M.D.   On: 08/23/2023 13:32   DG Chest Port 1 View Result Date: 08/23/2023 CLINICAL DATA:  Pleural effusion.  Status post right thoracentesis. EXAM: PORTABLE CHEST 1 VIEW COMPARISON:  Chest radiographs 08/22/2023 FINDINGS: The cardiac silhouette remains enlarged. Aortic atherosclerosis and previous CABG are again noted. Mild pulmonary vascular congestion is similar to the prior study. There are persistent small bilateral pleural effusions with bibasilar atelectasis. No pneumothorax is identified. IMPRESSION: Persistent small bilateral pleural effusions and bibasilar atelectasis. No pneumothorax. Electronically Signed   By: Sebastian Ache M.D.   On: 08/23/2023 13:00   ECHOCARDIOGRAM COMPLETE Result Date: 08/23/2023    ECHOCARDIOGRAM REPORT   Patient Name:   DARSH ROOKE Date of Exam: 08/23/2023 Medical Rec #:  [id number]        Height:       73.0 in Accession #:    [id number]       Weight:       218.3 lb Date of Birth:  07/27/1946         BSA:          2.233 m Patient Age:    77 years         BP:           165/73 mmHg Patient Gender: M                HR:           68 bpm. Exam Location:  [facility] Procedure: 2D Echo, Cardiac Doppler and Color Doppler Indications:     CHF-acute systolic I50.21  History:         Patient has prior history of Echocardiogram examinations, most                  recent 12/23/2022. Arrythmias:LBBB; Signs/Symptoms:Dyspnea.  Sonographer:     Cristela Blue Referring Phys:  [id number] Francoise Schaumann [place] Diagnosing Phys: Marcina Millard MD IMPRESSIONS  1. Left ventricular ejection fraction, by estimation, is 60 to 65%. The left ventricle has normal function. The left ventricle has no regional wall motion abnormalities. Left ventricular diastolic parameters were normal.  2. Right ventricular systolic function is normal. The right ventricular size is normal.  3. Left atrial size was mildly dilated.  4. Right atrial size was mildly dilated.  5. The mitral valve  is normal in structure. Mild to moderate mitral  valve regurgitation. No evidence of mitral stenosis.  6. The aortic valve is normal in structure. Aortic valve regurgitation is mild to moderate. Mild to moderate aortic valve stenosis.  7. The inferior vena cava is normal in size with greater than 50% respiratory variability, suggesting right atrial pressure of 3 mmHg. FINDINGS  Left Ventricle: Left ventricular ejection fraction, by estimation, is 60 to 65%. The left ventricle has normal function. The left ventricle has no regional wall motion abnormalities. The left ventricular internal cavity size was normal in size. There is  no left ventricular hypertrophy. Left ventricular diastolic parameters were normal. Right Ventricle: The right ventricular size is normal. No increase in right ventricular wall thickness. Right ventricular systolic function is normal. Left Atrium: Left atrial size was mildly dilated. Right Atrium: Right atrial size was mildly dilated. Pericardium: Trivial pericardial effusion is present. Mitral Valve: The mitral valve is normal in structure. Mild to moderate mitral valve regurgitation. No evidence of mitral valve stenosis. Tricuspid Valve: The tricuspid valve is normal in structure. Tricuspid valve regurgitation is not demonstrated. No evidence of tricuspid stenosis. Aortic Valve: The aortic valve is normal in structure. Aortic valve regurgitation is mild to moderate. Mild to moderate aortic stenosis is present. Aortic valve mean gradient measures 12.7 mmHg. Aortic valve peak gradient measures 22.0 mmHg. Aortic valve  area, by VTI measures 1.57 cm. Pulmonic Valve: The pulmonic valve was normal in structure. Pulmonic valve regurgitation is not visualized. No evidence of pulmonic stenosis. Aorta: The aortic root is normal in size and structure. Venous: The inferior vena cava is normal in size with greater than 50% respiratory variability, suggesting right atrial pressure of 3 mmHg. IAS/Shunts:  No atrial level shunt detected by color flow Doppler.  LEFT VENTRICLE PLAX 2D LVIDd:         5.10 cm   Diastology LVIDs:         3.10 cm   LV e' medial:    6.09 cm/s LV PW:         0.80 cm   LV E/e' medial:  21.7 LV IVS:        1.40 cm   LV e' lateral:   13.80 cm/s LVOT diam:     2.00 cm   LV E/e' lateral: 9.6 LV SV:         73 LV SV Index:   33 LVOT Area:     3.14 cm  RIGHT VENTRICLE RV Basal diam:  4.60 cm RV Mid diam:    4.00 cm LEFT ATRIUM             Index        RIGHT ATRIUM           Index LA diam:        4.80 cm 2.15 cm/m   RA Area:     21.80 cm LA Vol (A2C):   71.1 ml 31.84 ml/m  RA Volume:   64.80 ml  29.02 ml/m LA Vol (A4C):   68.6 ml 30.72 ml/m LA Biplane Vol: 74.2 ml 33.23 ml/m  AORTIC VALVE AV Area (Vmax):    1.33 cm AV Area (Vmean):   1.38 cm AV Area (VTI):     1.57 cm AV Vmax:           234.67 cm/s AV Vmean:          163.667 cm/s AV VTI:            0.466 m AV Peak Grad:  22.0 mmHg AV Mean Grad:      12.7 mmHg LVOT Vmax:         99.00 cm/s LVOT Vmean:        71.700 cm/s LVOT VTI:          0.232 m LVOT/AV VTI ratio: 0.50  AORTA Ao Root diam: 3.40 cm MITRAL VALVE                TRICUSPID VALVE MV Area (PHT): 4.24 cm     TR Peak grad:   40.4 mmHg MV Decel Time: 179 msec     TR Vmax:        318.00 cm/s MV E velocity: 132.00 cm/s MV A velocity: 50.30 cm/s   SHUNTS MV E/A ratio:  2.62         Systemic VTI:  0.23 m                             Systemic Diam: 2.00 cm Marcina Millard MD Electronically signed by Marcina Millard MD Signature Date/Time: 08/23/2023/11:45:16 AM    Final    DG Chest 2 View Result Date: 08/22/2023 CLINICAL DATA:  78 year old male with shortness of breath, fatigue. EXAM: CHEST - 2 VIEW COMPARISON:  CT Chest, Abdomen, and Pelvis 08/20/2023 and earlier. FINDINGS: PA and lateral views 0743 hours today. Stable cardiomegaly and mediastinal contours. Prior CABG. Ongoing bilateral pleural effusions, moderate and slightly larger on the right. No pneumothorax. Bilateral  pulmonary vascular congestion is relatively mild, mild interstitial edema is possible. No air bronchograms. Overall ventilation not significantly changed from recent CT. No acute osseous abnormality identified. Negative visible bowel gas. IMPRESSION: Bilateral pleural effusions and interstitial edema. Ventilation not significantly changed from CT two days ago. Electronically Signed   By: Odessa Fleming M.D.   On: 08/22/2023 07:59   CT CHEST ABDOMEN PELVIS WO CONTRAST Result Date: 08/20/2023 CLINICAL DATA:  Follow-up pleural effusions and abdominal ascites. EXAM: CT CHEST, ABDOMEN AND PELVIS WITHOUT CONTRAST TECHNIQUE: Multidetector CT imaging of the chest, abdomen and pelvis was performed following the standard protocol without IV contrast. RADIATION DOSE REDUCTION: This exam was performed according to the departmental dose-optimization program which includes automated exposure control, adjustment of the mA and/or kV according to patient size and/or use of iterative reconstruction technique. COMPARISON:  Abdominal ultrasound 08/06/2023 FINDINGS: CT CHEST FINDINGS Cardiovascular: The heart is mildly enlarged. The aorta is normal in caliber. Scattered atherosclerotic calcifications. Three-vessel coronary artery calcifications and surgical changes from prior coronary artery bypass surgery. Mediastinum/Nodes: Several enlarged mediastinal and hilar lymph nodes which could be related to the patient's lung findings and pleural effusions. The esophagus is grossly normal. There is a 2.3 cm right thyroid lobe lesion. This has been evaluated on previous imaging. (ref: J Am Coll Radiol. 2015 Feb;12(2): 143-50). Lungs/Pleura: Moderate-sized left pleural effusion and large right pleural effusion. Overlying atelectasis and moderate fluid in the left major fissure. No pulmonary edema. No worrisome pulmonary lesions. Musculoskeletal: Advanced degenerative changes involving the thoracic spine. Median sternotomy wires related to prior  heart surgery. CT ABDOMEN PELVIS FINDINGS Hepatobiliary: Suspect cirrhotic changes involving the liver with irregular hepatic contour, dilated hepatic fissures and increased caudate to right lobe ratio. No hepatic lesions are identified without contrast peer small calcified granuloma noted in the right lobe. The gallbladder is moderately contracted. There is associated gallbladder wall thickening and pericholecystic fluid likely due to the patient's ascites. No biliary dilatation. Pancreas: No mass, inflammation or  ductal dilatation. Spleen: Within normal limits in size.  No splenic lesions. Adrenals/Urinary Tract: The adrenal glands are normal. Simple bilateral renal cysts and bilateral renal calculi not requiring any further imaging evaluation or follow-up. The bladder is unremarkable. Stomach/Bowel: The stomach, duodenum, small bowel and colon grossly normal. Evidence of prior colonic surgery. Vascular/Lymphatic: Advanced atherosclerotic calcification involving the aorta, iliac arteries and branch vessels but no aneurysm. Borderline mesenteric and retroperitoneal lymph nodes likely related to the patient's ascites and cirrhosis. Reproductive: The prostate gland and seminal vesicles are unremarkable. Other: Small/moderate volume abdominal/pelvic ascites. Musculoskeletal: No significant bony findings. Advanced degenerative changes involving the spine and moderate bilateral hip joint degenerative changes. IMPRESSION: 1. Moderate-sized left pleural effusion and large right pleural effusion. Overlying atelectasis and moderate fluid in the left major fissure. 2. Several enlarged mediastinal and hilar lymph nodes which could be related to the patient's lung findings and pleural effusions. 3. Cirrhotic changes involving the liver. No hepatic lesions are identified without contrast. 4. Small/moderate volume abdominal/pelvic ascites. 5. Gallbladder wall thickening and pericholecystic fluid likely due to the patient's  ascites and cirrhosis. 6. Advanced atherosclerotic calcification.  No aneurysm. Aortic Atherosclerosis (ICD10-I70.0). Electronically Signed   By: Rudie Meyer M.D.   On: 08/20/2023 14:48   US ABDOMEN LIMITED RUQ (LIVER/GB) Result Date: 08/06/2023 CLINICAL DATA:  Evaluate for ascites.  Elevated LFTs. EXAM: ULTRASOUND ABDOMEN LIMITED RIGHT UPPER QUADRANT COMPARISON:  Ultrasound abdomen 09/19/2022 FINDINGS: Gallbladder: Multiple gallbladder polyps measuring up to 8 mm. Small stones in the gallbladder lumen. No gallbladder wall thickening or pericholecystic fluid. Common bile duct: Diameter: 3.1 mm Liver: Mild coarsened hepatic parenchymal echogenicity. No focal lesion. Portal vein is patent on color Doppler imaging with normal direction of blood flow towards the liver. Other: Bilateral pleural effusions. Upper abdominal ascites. 2.1 cm cyst exophytic superior pole right kidney. No imaging follow-up needed. IMPRESSION: 1. Upper abdominal ascites. 2. Bilateral pleural effusions. 3. Cholelithiasis without secondary signs of acute cholecystitis. 4. Multiple gallbladder polyps measuring up to 8 mm. Recommend follow-up ultrasound in 6 months. Electronically Signed   By: Annia Belt M.D.   On: 08/06/2023 09:06    Microbiology: Results for orders placed or performed during the hospital encounter of 08/22/23  Body fluid culture w Gram Stain     Status: None   Collection Time: 08/23/23 12:10 PM   Specimen: A: PATH Cytology Pleural fluid   B: PATH Cytology Pleural fluid  Result Value Ref Range Status   Specimen Description   Final    PLEURAL Performed at [facility], [address] [place], [place] [address]    Special Requests   Final    PLEURAL Performed at [facility], [address] [place], [place] [address]    Gram Stain   Final    WBC PRESENT,BOTH PMN AND MONONUCLEAR NO ORGANISMS SEEN CYTOSPIN SMEAR    Culture   Final    NO GROWTH 3 DAYS Performed at [facility], [address] [place], [place] [address]    Report Status 08/27/2023 FINAL  Final  Urine Culture     Status: None   Collection Time: 08/27/23 10:00 AM   Specimen: Urine, Random  Result Value Ref Range Status   Specimen Description   Final    URINE, RANDOM Performed at [facility], [address] [place], [place] [address]    Special Requests   Final    NONE Reflexed from [id number] Performed at [facility], [address] [facility]  [address], [place], [place] [address]    Culture   Final    NO GROWTH Performed at [facility], [address], [place], [place] [address]    Report Status 08/28/2023 FINAL  Final    Labs: CBC: Recent Labs  Lab 08/25/23 0547 08/26/23 0440 08/27/23 0441 08/27/23 1857 08/28/23 0438 08/29/23 0435  WBC 4.6 5.1 4.4  --  5.0 4.9  HGB 7.3* 8.3* 7.9* 8.2* 7.8* 8.3*  HCT 22.8* 25.2* 24.5* 25.5* 23.9* 26.0*  MCV 102.7* 100.4* 100.8*  --  100.0 100.8*  PLT 91* 89* 84*  --  85* 94*   Basic Metabolic Panel: Recent Labs  Lab 08/25/23 0814 08/26/23 0440 08/27/23 0441 08/28/23 0438 08/29/23 0435  NA 137 139 138 138 134*  K 3.3* 4.1 4.2 4.1 4.2  CL 103 103 104 103 100  CO2 25 26 26 25 26   GLUCOSE 135* 141* 141* 131* 156*  BUN 42* 43* 41* 38* 40*  CREATININE 3.01* 2.79* 2.82* 2.49* 2.67*  CALCIUM 9.7 9.6 9.4 9.3 9.8   Liver Function Tests: Recent Labs  Lab 08/23/23 0452  AST 18  ALT 17  ALKPHOS 85  BILITOT 1.5*  PROT 6.9  ALBUMIN 4.1   CBG: Recent Labs  Lab 08/26/23 1217  GLUCAP 110*    Discharge time spent: greater than 30 minutes.  Signed: Lucile Shutters, MD [facility] 08/29/2023

## 2023-08-30 ENCOUNTER — Ambulatory Visit: Payer: Medicare HMO

## 2023-09-02 ENCOUNTER — Inpatient Hospital Stay: Payer: Medicare HMO

## 2023-09-02 VITALS — BP 132/57 | HR 58 | Temp 97.4°F | Resp 18

## 2023-09-02 DIAGNOSIS — D509 Iron deficiency anemia, unspecified: Secondary | ICD-10-CM | POA: Diagnosis not present

## 2023-09-02 DIAGNOSIS — D508 Other iron deficiency anemias: Secondary | ICD-10-CM

## 2023-09-02 MED ORDER — IRON SUCROSE 20 MG/ML IV SOLN
200.0000 mg | INTRAVENOUS | Status: DC
  Administered 2023-09-02: 200 mg via INTRAVENOUS
  Filled 2023-09-02: qty 10

## 2023-09-02 MED ORDER — SODIUM CHLORIDE 0.9% FLUSH
10.0000 mL | Freq: Once | INTRAVENOUS | Status: AC | PRN
  Administered 2023-09-02: 10 mL
  Filled 2023-09-02: qty 10

## 2023-09-02 NOTE — Progress Notes (Signed)
Patient declined to wait the 30 minutes for post iron infusion observation today. Tolerated infusion well. VSS. 

## 2023-09-06 ENCOUNTER — Inpatient Hospital Stay: Payer: Medicare HMO

## 2023-09-09 ENCOUNTER — Inpatient Hospital Stay: Payer: Medicare HMO

## 2023-09-09 VITALS — BP 124/58 | HR 55 | Resp 18

## 2023-09-09 DIAGNOSIS — D508 Other iron deficiency anemias: Secondary | ICD-10-CM

## 2023-09-09 DIAGNOSIS — D509 Iron deficiency anemia, unspecified: Secondary | ICD-10-CM | POA: Diagnosis not present

## 2023-09-09 MED ORDER — IRON SUCROSE 20 MG/ML IV SOLN
200.0000 mg | INTRAVENOUS | Status: DC
Start: 2023-09-09 — End: 2023-09-09
  Administered 2023-09-09: 200 mg via INTRAVENOUS
  Filled 2023-09-09: qty 10

## 2023-09-09 MED ORDER — SODIUM CHLORIDE 0.9% FLUSH
10.0000 mL | Freq: Once | INTRAVENOUS | Status: AC | PRN
  Administered 2023-09-09: 10 mL
  Filled 2023-09-09: qty 10

## 2023-09-13 ENCOUNTER — Inpatient Hospital Stay: Payer: Medicare HMO

## 2023-09-15 DIAGNOSIS — I340 Nonrheumatic mitral (valve) insufficiency: Secondary | ICD-10-CM | POA: Insufficient documentation

## 2023-09-24 ENCOUNTER — Ambulatory Visit: Payer: Medicare HMO | Admitting: Gastroenterology

## 2023-10-08 ENCOUNTER — Ambulatory Visit (INDEPENDENT_AMBULATORY_CARE_PROVIDER_SITE_OTHER): Payer: Medicare HMO | Admitting: Gastroenterology

## 2023-10-08 VITALS — BP 136/77 | HR 74 | Temp 97.9°F | Ht 73.0 in | Wt 215.5 lb

## 2023-10-08 DIAGNOSIS — K7460 Unspecified cirrhosis of liver: Secondary | ICD-10-CM | POA: Diagnosis not present

## 2023-10-08 DIAGNOSIS — D649 Anemia, unspecified: Secondary | ICD-10-CM | POA: Diagnosis not present

## 2023-10-08 DIAGNOSIS — K5909 Other constipation: Secondary | ICD-10-CM

## 2023-10-08 DIAGNOSIS — K5900 Constipation, unspecified: Secondary | ICD-10-CM

## 2023-10-08 MED ORDER — LACTULOSE 10 GM/15ML PO SOLN: ORAL | 2 refills | Status: DC

## 2023-10-08 NOTE — Patient Instructions (Addendum)
 Ultrasound Abdomen Limited RUQ   Please call [phone] to reschedule.

## 2023-10-08 NOTE — Progress Notes (Addendum)
 Wyline Mood MD, MRCP(U.K) [address]  [address]  [place], [place] [address]  Main: [phone]  Fax: [phone]   Primary Care Physician: Jaclyn Shaggy, MD  Primary Gastroenterologist:  Dr. Wyline Mood   Chief Complaint  Patient presents with   Follow-up    HPI: [name] is a 78 y.o. male seen by Celso Amy on 08/21/2023 for iron deficiency anemia.   Patient has had worsening anemia in the past 7 months.  History of anemia of chronic disease and iron deficiency anemia.  Has received IV iron through Dr. Smith Robert hematologist.  Patient has had some dark soft loose stools.  He denies bright red rectal bleeding.  He denies constipation, abdominal pain, heartburn, dysphagia, nausea, or vomiting.  His weight has been up and down due to excessive fluid retention, CHF, extremity edema, changing diuretics.  He admits to moderate abdominal and lower extremity edema.   Nuclear Medicine Myocardial Perfusion Scan 08/08/2023: 1.  Normal left ventricular function 2.  Normal wall motion 3.  Mild to moderate anterolateral wall ischemia   Last OV w/ Cardiologist Dr. Jason Coop at [facility] 07/30/23.  12/2022 Echo: LVEF 60-65%.  F/U CHF and CAD.  3 mth f/u.   07/30/2023 labs: Hemoglobin 8.1, hematocrit 25, MCV 100, platelet 130, BUN 53, creatinine 2.7, GFR 24, glucose 123.  Vitamin B12 of 597. 04/2023: Hemoglobin 9.9 12/2022: Hemoglobin 11.5   05/2021: Colonoscopy: Good prep, normal ileocolonic anastomosis, One 4 mm tubular adenoma and One 6 mm hyperplastic polyp removed from transverse colon.  Internal hemorrhoids.  5-year repeat colonoscopy recommended (due 05/2026).   09/2020: EGD by Dr. Meridee Score: Multiple hyperplastic gastric polyps.  5 actively oozing polyps were removed, 1 with mucosal resection, and 4 with saline lift polypectomy.  Clips were placed.  Widely patent Schatzki's ring.  2 cm hiatal hernia.  Pathology of gastric polyps showed benign hyperplastic polyps.  Negative for  intestinal metaplasia or dysplasia.   08/2020 colonoscopy by Dr. Maximino Greenland: Fair prep, hemorrhoids, 1 small 6 mm tubular adenoma polyp removed from sigmoid colon.   08/2020 EGD by Dr. Maximino Greenland: Normal esophagus and duodenum.  Multiple large gastric polyps.  Biopsies showed hyperplastic and reactive foveolar hyperplasia.  No dysplasia or malignancy.  Negative for H. pylori.   Last saw Dr. Maximino Greenland in our office 03/2021 to follow-up iron deficiency anemia and constipation.  Had a large hyperplastic gastric polyps removed by Dr. Meridee Score.  History of GERD, hiatal hernia and Schatzki's ring.  Currently on Dexilant 60 Mg daily.  History of right partial colectomy in 2013.   Has anemia of chronic kidney disease.  Followed by Nephrology. History of CAD, A-fib, CHF, aortic stenosis.  Currently on Eliquis and Plavix.  History of GI bleed in 2022.   Admitted to hospital on 12/2022 due to acute respiratory failure with hypoxia and acute on chronic congestive heart failure. PMH significant for acute on chronic CHF, CAD, S/P CABG, S/P PCI and stent, hypertension, hyperlipidemia, OSA, diabetes, GERD, hypothyroidism, and obesity.   Last saw cardiology 07/30/2023.  He has been having some left-sided chest pain and increased shortness of breath.    08/20/23: CT Chest / Abd / Pelvis w/o CM: 1. Moderate-sized left pleural effusion and large right pleural effusion. Overlying atelectasis and moderate fluid in the left major fissure. 2. Several enlarged mediastinal and hilar lymph nodes which could be related to the patient's lung findings and pleural effusions. 3. Cirrhotic changes involving the liver. No hepatic lesions  are identified without contrast. 4. Small/moderate volume abdominal/pelvic ascites. 5. Gallbladder wall thickening and pericholecystic fluid likely due to the patient's ascites and cirrhosis. 6. Advanced atherosclerotic calcification.  No aneurysm. Aortic Atherosclerosis    Interval history     After the  office visit in 08/2023 pt was sent to the ER - underwent thoracocentesis, blood transfusion.   08/26/2023: EGD Dr Servando Snare : multiple gastric polyps non bleeding. Seen by Dr Servando Snare and plan was for GI OP follow up for abnormal LFT'scirrhosis.   On Torsemide for CKD- ? Cardiorenal syndrome 1/11 /2025: liver doppler: No PVT 08/29/2023: Cr 2.67, Hb 8.3, Plt 94   08/24/2023: Ceruloplasmin normal autoimmune and viral hepatitis screen was negative.  Alpha-1 antitrypsin levels negative 09/10/2023 seen by cardiology on Eliquis for atrial fibrillation, diuretics for CKD and CHF, irbesartan, labetalol, spironolactone, nebivolol, torsemide   Denies any dark stool.  Not noted any overt bleeding.  No NSAID use.  Suffer some constipation.  I was actually his main complaint.  Requesting some medication to take every day.  Denies any confusion.  He says he wakes up in the night to use the restroom as he is on diuretics.  He says that his maximum he weighed 275 pounds.  Denies any excess alcohol consumption except for a short period of time in the remote past.  He complains recently of abdominal distention.  Current Outpatient Medications  Medication Sig Dispense Refill   acetaminophen (TYLENOL) 500 MG tablet Take 2 tablets (1,000 mg total) by mouth every 6 (six) hours as needed for mild pain.     amLODipine (NORVASC) 5 MG tablet Take 5 mg by mouth 2 (two) times daily.     apixaban (ELIQUIS) 5 MG TABS tablet Take 1 tablet (5 mg total) by mouth 2 (two) times daily. 60 tablet 0   clopidogrel (PLAVIX) 75 MG tablet Take 75 mg by mouth daily.     cyanocobalamin (VITAMIN B12) 1000 MCG tablet Take 1,000 mcg by mouth daily.     dexlansoprazole (DEXILANT) 60 MG capsule Take 1 capsule by mouth daily.     empagliflozin (JARDIANCE) 10 MG TABS tablet Take 1 tablet (10 mg total) by mouth daily before breakfast. 30 tablet 0   Ferrous Sulfate (IRON) 325 (65 Fe) MG TABS Take 1 tablet by mouth daily.     hydrALAZINE (APRESOLINE) 50 MG  tablet Take 1 tablet by mouth 3 (three) times daily.     labetalol (NORMODYNE) 100 MG tablet Take 1 tablet by mouth 2 (two) times daily.     nitroGLYCERIN (NITROSTAT) 0.4 MG SL tablet Place 0.4 mg under the tongue every 5 (five) minutes as needed.     rosuvastatin (CRESTOR) 40 MG tablet Take 40 mg by mouth daily.     sitaGLIPtin (JANUVIA) 100 MG tablet Take 100 mg by mouth daily.     Testosterone 1.62 % GEL Apply 2 Pump topically daily. One pump on each arm 225 g 3   torsemide (DEMADEX) 20 MG tablet Take 2 tablets (40 mg total) by mouth daily. 60 tablet 0   No current facility-administered medications for this visit.    Allergies as of 10/08/2023 - Review Complete 10/08/2023  Allergen Reaction Noted   Isosorbide Cough 11/14/2016     ROS:  General: Negative for anorexia, weight loss, fever, chills, fatigue, weakness. ENT: Negative for hoarseness, difficulty swallowing , nasal congestion. CV: Negative for chest pain, angina, palpitations, dyspnea on exertion, peripheral edema.  Respiratory: Negative for dyspnea at rest, dyspnea on  exertion, cough, sputum, wheezing.  GI: See history of present illness. GU:  Negative for dysuria, hematuria, urinary incontinence, urinary frequency, nocturnal urination.  Endo: Negative for unusual weight change.    Physical Examination:   BP 136/77   Pulse 74   Temp 97.9 F (36.6 C) (Oral)   Ht 6\' 1"  (1.854 m)   Wt 215 lb 8 oz (97.8 kg)   BMI 28.43 kg/m   General: Well-nourished, well-developed in no acute distress.  Eyes: No icterus. Conjunctivae pink. Mouth: Oropharyngeal mucosa moist and pink , no lesions erythema or exudate..  Abdomen: Bowel sounds are normal, nontender, general distention no free fluid no tenderness no guarding or rigidity no hepatosplenomegaly or masses, no abdominal bruits or hernia , no rebound or guarding.   Extremities: Lateral pedal edema. Neuro: Alert and oriented x 3.  Grossly intact. Skin: Warm and dry, no  jaundice.   Psych: Alert and cooperative, normal mood and affect.   Imaging Studies: No results found.  Assessment and Plan:   [name] is a 78 y.o. y/o male with multiple comorbidities including CKD, atrial fibrillation, CAD.  Seen by Dr. Servando Snare during recent hospitalization for melena upper endoscopy showed nonbleeding gastric polyps.  Received blood transfusions.  Noted to have cirrhosis on the imaging.  Autoimmune and viral hepatitis screen has been negative.  Vesely seen by hematology for normocytic anemia.  Iron studies were normal.  In January 2025 ferritin was 125.  Likely anemia of chronic kidney disease.  Likely etiology of chronic liver disease is possibly fatty liver disease nonalcoholic.  No history of further GI tract bleeding   Plan 1.  Right upper quadrant ultrasound and AFP in July 2025 to screen for [facility] 2.  EGD to screen for esophageal varices in 2028 3.  Recommend pneumococcal vaccine, check hepatitis A and B antibody status and will recommend vaccination subsequently 4.  Diuretics are being managed by cardiology. 5.  Avoid all NSAIDs 6.  Recommend ultrasound abdomen to evaluate for distention and rule out ascites.  Possibly due to constipation 7.  Commence on lactulose 15 to 20 cc 3 times daily or 4 times daily for constipation 8.  Recommend to stop Tylenol unless needed due to chronic liver disease maximum 1000 mg/day Dr Wyline Mood  MD,MRCP [facility]) Follow up in 4 months

## 2023-10-08 NOTE — Addendum Note (Signed)
 Addended by: Tawnya Crook on: 10/08/2023 01:59 PM   Modules accepted: Orders

## 2023-10-15 ENCOUNTER — Inpatient Hospital Stay: Payer: Medicare HMO

## 2023-10-15 ENCOUNTER — Inpatient Hospital Stay: Payer: Medicare HMO | Admitting: Oncology

## 2023-10-16 ENCOUNTER — Emergency Department

## 2023-10-16 ENCOUNTER — Inpatient Hospital Stay
Admission: EM | Admit: 2023-10-16 | Discharge: 2023-10-19 | DRG: 811 | Disposition: A | Discharge status: Home / Self Care | Attending: Osteopathic Medicine | Admitting: Osteopathic Medicine

## 2023-10-16 ENCOUNTER — Other Ambulatory Visit: Payer: Self-pay

## 2023-10-16 DIAGNOSIS — D6832 Hemorrhagic disorder due to extrinsic circulating anticoagulants: Secondary | ICD-10-CM | POA: Diagnosis present

## 2023-10-16 DIAGNOSIS — D649 Anemia, unspecified: Secondary | ICD-10-CM

## 2023-10-16 DIAGNOSIS — K219 Gastro-esophageal reflux disease without esophagitis: Secondary | ICD-10-CM | POA: Diagnosis present

## 2023-10-16 DIAGNOSIS — Z8349 Family history of other endocrine, nutritional and metabolic diseases: Secondary | ICD-10-CM

## 2023-10-16 DIAGNOSIS — E039 Hypothyroidism, unspecified: Secondary | ICD-10-CM | POA: Diagnosis present

## 2023-10-16 DIAGNOSIS — K317 Polyp of stomach and duodenum: Secondary | ICD-10-CM

## 2023-10-16 DIAGNOSIS — I2510 Atherosclerotic heart disease of native coronary artery without angina pectoris: Secondary | ICD-10-CM | POA: Diagnosis present

## 2023-10-16 DIAGNOSIS — E1122 Type 2 diabetes mellitus with diabetic chronic kidney disease: Secondary | ICD-10-CM | POA: Diagnosis present

## 2023-10-16 DIAGNOSIS — Z79899 Other long term (current) drug therapy: Secondary | ICD-10-CM

## 2023-10-16 DIAGNOSIS — R0609 Other forms of dyspnea: Secondary | ICD-10-CM | POA: Insufficient documentation

## 2023-10-16 DIAGNOSIS — I700 Atherosclerosis of aorta: Secondary | ICD-10-CM | POA: Diagnosis present

## 2023-10-16 DIAGNOSIS — I509 Heart failure, unspecified: Secondary | ICD-10-CM

## 2023-10-16 DIAGNOSIS — N189 Chronic kidney disease, unspecified: Secondary | ICD-10-CM | POA: Diagnosis present

## 2023-10-16 DIAGNOSIS — K635 Polyp of colon: Secondary | ICD-10-CM | POA: Diagnosis not present

## 2023-10-16 DIAGNOSIS — Z888 Allergy status to other drugs, medicaments and biological substances status: Secondary | ICD-10-CM

## 2023-10-16 DIAGNOSIS — K922 Gastrointestinal hemorrhage, unspecified: Secondary | ICD-10-CM | POA: Diagnosis not present

## 2023-10-16 DIAGNOSIS — D631 Anemia in chronic kidney disease: Secondary | ICD-10-CM | POA: Diagnosis present

## 2023-10-16 DIAGNOSIS — I480 Paroxysmal atrial fibrillation: Secondary | ICD-10-CM | POA: Diagnosis present

## 2023-10-16 DIAGNOSIS — D123 Benign neoplasm of transverse colon: Secondary | ICD-10-CM | POA: Diagnosis present

## 2023-10-16 DIAGNOSIS — I350 Nonrheumatic aortic (valve) stenosis: Secondary | ICD-10-CM | POA: Diagnosis present

## 2023-10-16 DIAGNOSIS — E7849 Other hyperlipidemia: Secondary | ICD-10-CM | POA: Diagnosis present

## 2023-10-16 DIAGNOSIS — I5033 Acute on chronic diastolic (congestive) heart failure: Secondary | ICD-10-CM | POA: Diagnosis present

## 2023-10-16 DIAGNOSIS — I272 Pulmonary hypertension, unspecified: Secondary | ICD-10-CM | POA: Diagnosis present

## 2023-10-16 DIAGNOSIS — Z7901 Long term (current) use of anticoagulants: Secondary | ICD-10-CM

## 2023-10-16 DIAGNOSIS — Z8249 Family history of ischemic heart disease and other diseases of the circulatory system: Secondary | ICD-10-CM

## 2023-10-16 DIAGNOSIS — N179 Acute kidney failure, unspecified: Secondary | ICD-10-CM | POA: Diagnosis present

## 2023-10-16 DIAGNOSIS — Z87442 Personal history of urinary calculi: Secondary | ICD-10-CM

## 2023-10-16 DIAGNOSIS — Z87891 Personal history of nicotine dependence: Secondary | ICD-10-CM

## 2023-10-16 DIAGNOSIS — K921 Melena: Secondary | ICD-10-CM | POA: Diagnosis present

## 2023-10-16 DIAGNOSIS — K642 Third degree hemorrhoids: Secondary | ICD-10-CM | POA: Diagnosis present

## 2023-10-16 DIAGNOSIS — N184 Chronic kidney disease, stage 4 (severe): Secondary | ICD-10-CM | POA: Diagnosis present

## 2023-10-16 DIAGNOSIS — K7460 Unspecified cirrhosis of liver: Secondary | ICD-10-CM | POA: Diagnosis present

## 2023-10-16 DIAGNOSIS — J90 Pleural effusion, not elsewhere classified: Secondary | ICD-10-CM | POA: Diagnosis present

## 2023-10-16 DIAGNOSIS — Z7984 Long term (current) use of oral hypoglycemic drugs: Secondary | ICD-10-CM

## 2023-10-16 DIAGNOSIS — Z955 Presence of coronary angioplasty implant and graft: Secondary | ICD-10-CM

## 2023-10-16 DIAGNOSIS — Z8616 Personal history of COVID-19: Secondary | ICD-10-CM | POA: Diagnosis not present

## 2023-10-16 DIAGNOSIS — Z85828 Personal history of other malignant neoplasm of skin: Secondary | ICD-10-CM | POA: Insufficient documentation

## 2023-10-16 DIAGNOSIS — D62 Acute posthemorrhagic anemia: Secondary | ICD-10-CM | POA: Diagnosis present

## 2023-10-16 DIAGNOSIS — J918 Pleural effusion in other conditions classified elsewhere: Secondary | ICD-10-CM | POA: Diagnosis present

## 2023-10-16 DIAGNOSIS — Z9889 Other specified postprocedural states: Secondary | ICD-10-CM

## 2023-10-16 DIAGNOSIS — I130 Hypertensive heart and chronic kidney disease with heart failure and stage 1 through stage 4 chronic kidney disease, or unspecified chronic kidney disease: Secondary | ICD-10-CM | POA: Diagnosis present

## 2023-10-16 DIAGNOSIS — Z7902 Long term (current) use of antithrombotics/antiplatelets: Secondary | ICD-10-CM | POA: Diagnosis not present

## 2023-10-16 DIAGNOSIS — G4733 Obstructive sleep apnea (adult) (pediatric): Secondary | ICD-10-CM | POA: Diagnosis present

## 2023-10-16 DIAGNOSIS — Z951 Presence of aortocoronary bypass graft: Secondary | ICD-10-CM

## 2023-10-16 DIAGNOSIS — Z860100 Personal history of colon polyps, unspecified: Secondary | ICD-10-CM

## 2023-10-16 DIAGNOSIS — Z794 Long term (current) use of insulin: Secondary | ICD-10-CM

## 2023-10-16 DIAGNOSIS — R0602 Shortness of breath: Secondary | ICD-10-CM | POA: Diagnosis present

## 2023-10-16 DIAGNOSIS — I10 Essential (primary) hypertension: Secondary | ICD-10-CM | POA: Diagnosis present

## 2023-10-16 DIAGNOSIS — K760 Fatty (change of) liver, not elsewhere classified: Secondary | ICD-10-CM | POA: Diagnosis present

## 2023-10-16 LAB — BASIC METABOLIC PANEL
Anion gap: 14 (ref 5–15)
BUN: 54 mg/dL — ABNORMAL HIGH (ref 8–23)
CO2: 23 mmol/L (ref 22–32)
Calcium: 9.6 mg/dL (ref 8.9–10.3)
Chloride: 99 mmol/L (ref 98–111)
Creatinine, Ser: 3.67 mg/dL — ABNORMAL HIGH (ref 0.61–1.24)
GFR, Estimated: 16 mL/min — ABNORMAL LOW (ref >60)
Glucose, Bld: 192 mg/dL — ABNORMAL HIGH (ref 70–99)
Potassium: 4.2 mmol/L (ref 3.5–5.1)
Sodium: 136 mmol/L (ref 135–145)

## 2023-10-16 LAB — CBC
HCT: 20.7 % — ABNORMAL LOW (ref 39.0–52.0)
Hemoglobin: 6 g/dL — ABNORMAL LOW (ref 13.0–17.0)
MCH: 31.6 pg (ref 26.0–34.0)
MCHC: 29 g/dL — ABNORMAL LOW (ref 30.0–36.0)
MCV: 108.9 fL — ABNORMAL HIGH (ref 80.0–100.0)
Platelets: 163 10*3/uL (ref 150–400)
RBC: 1.9 MIL/uL — ABNORMAL LOW (ref 4.22–5.81)
RDW: 16.1 % — ABNORMAL HIGH (ref 11.5–15.5)
WBC: 6.1 10*3/uL (ref 4.0–10.5)
nRBC: 0.3 % — ABNORMAL HIGH (ref 0.0–0.2)

## 2023-10-16 LAB — PREPARE RBC (CROSSMATCH)

## 2023-10-16 LAB — TROPONIN I (HIGH SENSITIVITY)
Troponin I (High Sensitivity): 28 ng/L — ABNORMAL HIGH (ref <18)
Troponin I (High Sensitivity): 27 ng/L — ABNORMAL HIGH (ref <18)

## 2023-10-16 LAB — BRAIN NATRIURETIC PEPTIDE: B Natriuretic Peptide: 942.6 pg/mL — ABNORMAL HIGH (ref 0.0–100.0)

## 2023-10-16 MED ORDER — HYDRALAZINE HCL 20 MG/ML IJ SOLN: 5.0000 mg | Freq: Four times a day (QID) | INTRAMUSCULAR | Status: DC | PRN

## 2023-10-16 MED ORDER — VITAMIN B-12 1000 MCG PO TABS
1000.0000 ug | ORAL_TABLET | Freq: Every day | ORAL | Status: DC
  Administered 2023-10-17 – 2023-10-19 (×3): 1000 ug via ORAL
  Filled 2023-10-16 (×3): qty 1

## 2023-10-16 MED ORDER — SODIUM CHLORIDE 0.9% IV SOLUTION: Freq: Once | INTRAVENOUS | Status: AC

## 2023-10-16 MED ORDER — ACETAMINOPHEN 325 MG PO TABS
650.0000 mg | ORAL_TABLET | Freq: Four times a day (QID) | ORAL | Status: DC | PRN
  Administered 2023-10-16 – 2023-10-19 (×3): 650 mg via ORAL
  Filled 2023-10-16 (×4): qty 2

## 2023-10-16 MED ORDER — LACTULOSE 10 GM/15ML PO SOLN: 20.0000 g | Freq: Three times a day (TID) | ORAL | Status: DC | PRN

## 2023-10-16 MED ORDER — PANTOPRAZOLE SODIUM 40 MG IV SOLR
80.0000 mg | Freq: Two times a day (BID) | INTRAVENOUS | Status: AC
  Administered 2023-10-16 – 2023-10-18 (×6): 80 mg via INTRAVENOUS
  Filled 2023-10-16 (×6): qty 20

## 2023-10-16 MED ORDER — ROSUVASTATIN CALCIUM 10 MG PO TABS
40.0000 mg | ORAL_TABLET | Freq: Every day | ORAL | Status: DC
  Administered 2023-10-16 – 2023-10-18 (×3): 40 mg via ORAL
  Filled 2023-10-16 (×3): qty 4
  Filled 2023-10-16: qty 2

## 2023-10-16 MED ORDER — ONDANSETRON HCL 4 MG/2ML IJ SOLN: 4.0000 mg | Freq: Four times a day (QID) | INTRAMUSCULAR | Status: DC | PRN

## 2023-10-16 MED ORDER — ONDANSETRON HCL 4 MG PO TABS: 4.0000 mg | ORAL_TABLET | Freq: Four times a day (QID) | ORAL | Status: DC | PRN

## 2023-10-16 MED ORDER — ACETAMINOPHEN 650 MG RE SUPP: 650.0000 mg | Freq: Four times a day (QID) | RECTAL | Status: DC | PRN

## 2023-10-16 MED ORDER — SODIUM CHLORIDE 0.9 % IV SOLN
2.0000 g | INTRAVENOUS | Status: DC
  Filled 2023-10-16: qty 20

## 2023-10-16 NOTE — Hospital Course (Addendum)
 Hospital course / significant events:   HPI: [name] is a 78 year old male with history of paroxysmal atrial fibrillation on Eliquis, heart failure preserved ejection fraction, hypertension, obstructive sleep apnea, hyperlipidemia, CAD status post CABG and DES, history of liver cirrhosis, symptomatic anemia, pulmonary hypertension, who presents emergency department for chief concerns of shortness of breath and chest pain. He reports he has had ongoing shortness of breath for the last few months and it has been worsening over the last few days.  He reports that shortness of breath is worse with exertion. He has been having black stool since January and this has not resolved. Of note, had EGD at that time 01/13 - normal esophagus, multiple pedunculated and sessile polyps without bleeding found in entire examined stomach, normal duodenum    03/05: to ED, Hgb 6.0, 1 unit PRBC ordered and GI consulted - likely for EGD after Palvix washout. Admitted to hospitalist.  03/06: no improvement renal function, Hgb 7.2, thoracentesis --> 2.2 L. Pt reports worsening edema, will trial lasix x1, pt aware may need to hold this if renal function worsening. GI eval - anticipate EGD this weekend.  03/07: Hgb down 6.9, 1 unit PRBC      Consultants:  Gastroenterology   Procedures/Surgeries: 10/17/23 thoracentesis       ASSESSMENT & PLAN:   Severe anemia ABLA d/t GI bleed S/p 2 unit PRBC 03/05, 1 unit PRBC 03/07 Protonix 80 mg IV bid Hold Plavix and Eliquis. Avoid NSAIDs.  EGD and colonoscopy tomorrow 03/08 per GI  Clear liquid diet pend GI recs  GI following Follow CBC, transfuse to keep Hgb at least 7+  Recurrent/chronic pleural effusion Had thoracentsis 08/2023 also S/p thoracentesis 10/17/23 removed 2/2 L Strict Is and Os  diuresis w/ close monitoring renal function   CAD (coronary artery disease), native coronary artery  Hx CABG 2018 S/P drug eluting coronary stent placement Patient  last took Plavix 10/16/23 a.m. and Eliquis 10/15/23 p.m. 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 twice daily to complete a 7-day course Ceftriaxone 2 g IV daily, 7 days course ordered on admission Wound care     Hyperlipidemia Home rosuvastatin 40 mg nightly resumed   OSA (obstructive sleep apnea) CPAP nightly declined by patient, states he does not use this at home    Essential hypertension Home amlodipine 5 mg p.o. twice daily, labetalol 100 mg p.o. twice daily, hydralazine 50 mg 3 times daily, torsemide 40 mg daily not resumed on admission as patient is low normotensive on admission Resume beta blocker lower dose  Hydralazine 5 mg IV every 6 hours as needed for SBP greater 165, 5 days ordered      overweight based on BMI: Body mass index is 28.37 kg/m.  Underweight - under 18  overweight - 25 to 29 obese - 30 or more Class 1 obesity: BMI of 30.0 to 34 Class 2 obesity: BMI of 35.0 to 39 Class 3 obesity: BMI  of 40.0 to 49 Super Morbid Obesity: BMI 50-59 Super-super Morbid Obesity: BMI 60+ Significantly low or high BMI is associated with higher medical risk.  Weight management advised as adjunct to other disease management and risk reduction treatments    DVT prophylaxis: no Rx ppx d/t bleed risk  IV fluids: no continuous IV fluids  Nutrition: CLD pending GI recs   Central lines / invasive devices: none  Code Status: FULL CODE ACP documentation reviewed:  none on file in [facility]  TOC needs: none at this time, anticipate will need home health Barriers to dispo / significant pending items: GI bleed, monitoring anemia / renal fxn, GI consult pending, expect emdical stability in few more days / pending EGD

## 2023-10-16 NOTE — Assessment & Plan Note (Signed)
 Etiology workup in progress, differentials include heart failure exacerbation, pleural effusion, acute on chronic severe anemia secondary to GI bleed in setting of Plavix and Eliquis use for history of CAD

## 2023-10-16 NOTE — Progress Notes (Signed)
 Pt sitting on bedside A/O. Pt stated he does not use a cpap unit at home. Declined facility use. RT will cont care as needed.

## 2023-10-16 NOTE — Assessment & Plan Note (Signed)
 CPAP nightly ordered

## 2023-10-16 NOTE — Assessment & Plan Note (Signed)
 Baseline CKD stage IV, with baseline serum creatinine of 2.49- 2.82 Suspect secondary to cardiorenal in setting of right pleural effusion that is recurrent Patient had complete echo on 08/23/2023 which read as estimated ejection fraction of 60 to 65% Ultrasound-guided thoracentesis has been ordered on admission Strict I's and O's A.m. team to consult cardiology if needed

## 2023-10-16 NOTE — Assessment & Plan Note (Signed)
Home rosuvastatin 40 mg nightly resumed 

## 2023-10-16 NOTE — ED Provider Notes (Addendum)
 [facility] Provider Note    Event Date/Time   First MD Initiated Contact with Patient 10/16/23 1410     (approximate)   History   Chest Pain and Shortness of Breath   HPI  [name] is a 78 y.o. male with a history of anemia of chronic disease, CHF, CAD, atrial fibrillation on Eliquis who presents with complaints of chest discomfort, shortness of breath especially with exertion.  He also feels lightheaded when he ambulates.     Physical Exam   Triage Vital Signs: ED Triage Vitals  Encounter Vitals Group     BP 10/16/23 1407 (!) 117/57     Systolic BP Percentile --      Diastolic BP Percentile --      Pulse Rate 10/16/23 1407 71     Resp 10/16/23 1406 (!) 22     Temp 10/16/23 1406 97.7 F (36.5 C)     Temp src --      SpO2 10/16/23 1407 93 %     Weight 10/16/23 1405 97.5 kg (215 lb)     Height 10/16/23 1405 1.854 m (6\' 1" )     Head Circumference --      Peak Flow --      Pain Score 10/16/23 1405 0     Pain Loc --      Pain Education --      Exclude from Growth Chart --     Most recent vital signs: Vitals:   10/16/23 1406 10/16/23 1407  BP:  (!) 117/57  Pulse:  71  Resp: (!) 22   Temp: 97.7 F (36.5 C)   SpO2:  93%     General: Awake, no distress.  CV:  Good peripheral perfusion.  Resp:  Normal effort.  Bibasilar Rales Abd:  No distention.  On rectal exam dark stool guaiac positive Other:  Edema bilaterally   ED Results / Procedures / Treatments   Labs (all labs ordered are listed, but only abnormal results are displayed) Labs Reviewed  BASIC METABOLIC PANEL - Abnormal; Notable for the following components:      Result Value   Glucose, Bld 192 (*)    BUN 54 (*)    Creatinine, Ser 3.67 (*)    GFR, Estimated 16 (*)    All other components within normal limits  CBC - Abnormal; Notable for the following components:   RBC 1.90 (*)    Hemoglobin 6.0 (*)    HCT 20.7 (*)    MCV 108.9 (*)    MCHC 29.0 (*)    RDW  16.1 (*)    nRBC 0.3 (*)    All other components within normal limits  BRAIN NATRIURETIC PEPTIDE - Abnormal; Notable for the following components:   B Natriuretic Peptide 942.6 (*)    All other components within normal limits  TROPONIN I (HIGH SENSITIVITY) - Abnormal; Notable for the following components:   Troponin I (High Sensitivity) 28 (*)    All other components within normal limits  PREPARE RBC (CROSSMATCH)  TYPE AND SCREEN     EKG ED ECG REPORT I, Jene Every, the attending physician, personally viewed and interpreted this ECG.  Date: 10/16/2023  Rhythm: Atrial fibrillation QRS Axis: normal Intervals: Abnormal ST/T Wave abnormalities: normal Narrative Interpretation: no evidence of acute ischemia    RADIOLOGY Chest x-ray viewed interpret by me, suspicious for pleural effusions, mil;d edema   PROCEDURES:  Critical Care performed: yes  CRITICAL CARE Performed by: Molly Maduro  Ica Daye   Total critical care time: 30 minutes  Critical care time was exclusive of separately billable procedures and treating other patients.  Critical care was necessary to treat or prevent imminent or life-threatening deterioration.  Critical care was time spent personally by me on the following activities: development of treatment plan with patient and/or surrogate as well as nursing, discussions with consultants, evaluation of patient's response to treatment, examination of patient, obtaining history from patient or surrogate, ordering and performing treatments and interventions, ordering and review of laboratory studies, ordering and review of radiographic studies, pulse oximetry and re-evaluation of patient's condition.   Procedures   MEDICATIONS ORDERED IN ED: Medications - No data to display   IMPRESSION / MDM / ASSESSMENT AND PLAN / ED COURSE  I reviewed the triage vital signs and the nursing notes. Patient's presentation is most consistent with acute presentation with  potential threat to life or bodily function.  Patient with history as detailed above presents with complaints of shortness of breath particular with exertion which improves with rest.  He also has chest discomfort with exertion.  Which also improves with rest.  Differential includes CHF exacerbation, anemia, pneumonia, angina  Lab work is notable for hemoglobin of 6.0, his baseline appears to be around 8-8.5, his creatinine is also elevated at 3.67, baseline appears to be around 2.5  He has significant edema in the legs bilaterally, suspect combination of anemia of chronic disease as well as CHF exacerbation  Rectal exam performed, patient does have dark stool, guaiac positive, not overtly bloody, not melanotic  Have ordered 2 units of PRBCs  Will discuss with the hospitalist for admission          FINAL CLINICAL IMPRESSION(S) / ED DIAGNOSES   Final diagnoses:  Gastrointestinal hemorrhage, unspecified gastrointestinal hemorrhage type  Anemia, unspecified type  Acute kidney injury (HCC)  Acute on chronic congestive heart failure, unspecified heart failure type (HCC)     Rx / DC Orders   ED Discharge Orders     None        Note:  This document was prepared using Dragon voice recognition software and may include unintentional dictation errors.   Jene Every, MD 10/16/23 1506    Jene Every, MD 10/16/23 [phone]

## 2023-10-16 NOTE — ED Notes (Signed)
 Called CCMD for cardiac monitoring

## 2023-10-16 NOTE — H&P (Addendum)
 History and Physical   [name] XBM:[id number] DOB: 1946-03-03 DOA: 10/16/2023  PCP: Jaclyn Shaggy, MD  Patient coming from: home  I have personally briefly reviewed patient's old medical records in [facility] EMR.  Chief Concern: Chest pain, shortness of breath  HPI: [name] is a 78 year old male with history of paroxysmal atrial fibrillation on Eliquis, heart failure preserved ejection fraction, hypertension, obstructive sleep apnea, hyperlipidemia, CAD status post CABG and DES, history of liver cirrhosis, symptomatic anemia, pulmonary hypertension, who presents emergency department for chief concerns of shortness of breath and chest pain.  Vitals in the ED showed temperature of 97.7, respiration rate 22, heart rate 71, increased to 107, blood pressure 117/57, SpO2 of 93% on room air.  Serum sodium is 136, potassium 4.2, chloride 99, bicarb 23, BUN of 54, serum creatinine 3.67, eGFR 16, nonfasting blood glucose 192, WBC 6.1, hemoglobin 6.0, platelets of 163.  BNP is 942.6.  High sensitive troponin is 28.  ED treatment: Type and screen, 1 unit PRBC ordered for transfusion per EDP --------------------------------------- At bedside, patient able to tell me his first and last name, age, location, current calendar year.  Patient appears uncomfortable.  He reports he has had ongoing shortness of breath for the last few months and it has been worsening over the last few days.  He reports that shortness of breath is worse with exertion.  He has been having black stool since January and this has not resolved.  He denies chest pain, dysuria, hematuria, diarrhea.  He endorses increased swelling of his lower extremities and has gained approximately 5 pounds in the last week.  He denies known trauma to his person.  He denies fever, chills, nausea, vomiting, bright red blood in his stool.  Social history: He lives at home.  He denies tobacco, EtOH, recreational drug use.  He is  retired.  ROS: Constitutional: + weight change (weight gain), no fever ENT/Mouth: no sore throat, no rhinorrhea Eyes: no eye pain, no vision changes Cardiovascular: no chest pain, + dyspnea,  + edema, no palpitations Respiratory: no cough, no sputum, no wheezing Gastrointestinal: no nausea, no vomiting, no diarrhea, no constipation Genitourinary: no urinary incontinence, no dysuria, no hematuria Musculoskeletal: no arthralgias, no myalgias Skin: no skin lesions, no pruritus, Neuro: + weakness, no loss of consciousness, no syncope Psych: no anxiety, no depression, + decrease appetite Heme/Lymph: no bruising, no bleeding  ED Course: Discussed with EDP, patient requiring hospitalization for chief concerns of heart failure exacerbation and severe symptomatic anemia.  Assessment/Plan  Principal Problem:   Severe anemia Active Problems:   CAD (coronary artery disease), native coronary artery   Upper GI bleed   Acute kidney injury superimposed on chronic kidney disease (HCC)   Recurrent right pleural effusion   Essential hypertension   OSA (obstructive sleep apnea)   S/P CABG x 3   Other hyperlipidemia   S/P drug eluting coronary stent placement   Severe pulmonary hypertension (HCC)   Status post Mohs surgery   Symptomatic anemia   Acute exacerbation of CHF (congestive heart failure) (HCC)   Dyspnea on exertion   Assessment and Plan:  * Severe anemia Per EDP, fecal occult was positive There is suspicion for GI bleed Protonix 80 mg IV twice daily ordered on admission Clear liquid diet GI service has been consulted via secure chat and Epic order 1 unit PRBC has been ordered for transfusion  Upper GI bleed GI consulted, Protonix twice daily, PRBC ordered for transfusion per  EDP Peripheral IV (please ensure and maintain 2 peripheral IV) ordered  CAD (coronary artery disease), native coronary artery Rosuvastatin 40 mg nightly resumed Plavix, Eliquis will not be resumed on  admission in setting of suspected upper GI bleed with severe acute on chronic anemia  Recurrent right pleural effusion Ultrasound-guided paracentesis pleural effusion on the right side ordered on admission Strict Is and Os  Acute kidney injury superimposed on chronic kidney disease (HCC) Baseline CKD stage IV, with baseline serum creatinine of 2.49- 2.82 Suspect secondary to cardiorenal in setting of right pleural effusion that is recurrent Patient had complete echo on 08/23/2023 which read as estimated ejection fraction of 60 to 65% Ultrasound-guided thoracentesis has been ordered on admission Strict I's and O's A.m. team to consult cardiology if needed  Dyspnea on exertion Etiology workup in progress, differentials include heart failure exacerbation, pleural effusion, acute on chronic severe anemia secondary to GI bleed in setting of Plavix and Eliquis use for history of CAD  Status post Mohs surgery Status post Mohs surgery of the left ear today, 10/16/2023 at the [facility] Patient was prescribed cephalexin 500 mg p.o. twice daily to complete a 7-day course Ceftriaxone 2 g IV daily, 7 days course ordered on admission Patient states that son can bring him instructions from dermatology clinic at [facility] for wound care management.  At bedside he states that the bandages will remain in place for 24 hours. Wound care consulted  S/P drug eluting coronary stent placement Patient last took Plavix this a.m. and Eliquis nightly at 10/15/2023  Other hyperlipidemia Home rosuvastatin 40 mg nightly resumed  OSA (obstructive sleep apnea) CPAP nightly ordered  Essential hypertension Home amlodipine 5 mg p.o. twice daily, labetalol 100 mg p.o. twice daily, hydralazine 50 mg 3 times daily, torsemide 40 mg daily not resumed on admission as patient is low normotensive on admission AM team to resume home antihypertensive medication when the benefits outweigh the risk Hydralazine 5 mg IV every 6 hours as needed  for SBP greater 165, 5 days ordered  Chart reviewed.   DVT prophylaxis: [name] hose; AM team to resume home anticoagulation when the benefits outweigh the risk Code Status: Full code Diet: Clear liquid diet Family Communication: Updated son, Ethelene Browns at bedside Disposition Plan: Pending clinical course, pending thoracentesis and gastroenterologist evaluation and recommendation Consults called: Gastroenterology service Admission status: PCU, inpt  Past Medical History:  Diagnosis Date   (HFpEF) heart failure with preserved ejection fraction (HCC) 07/25/2016   a.)TTE 07/25/16: EF >55, triv-mild pan regur, mild AS, G2DD; b.)TTE 09/02/16: EF >55, triv TR, mild AS; c.)TTE 11/05/16: EF >55, LAE, triv-mild pan regur, mild AS, G2DD; d.)TTE 01/21/20: EF >55, mod LVH, LAE/RVE, triv-mild AR/PR/TR, mod MR, mild AS, G2DD; e.)TTE 09/24/22: EF>55, sev LVH, sev LAE, RAE, mild PR, mod AR/MR/TR, mild-mod AS, RVSP 74.7; f.)TTE 12/23/22: EF 60-65, LAE, mild-mod MR/TR, mild AS   Abnormal urine 05/01/2023   Anemia    Aortic atherosclerosis (HCC)    Aortic stenosis 07/25/2016   a.) TTE 07/25/2016: mild (MPG 13.5); b.) TTE 09/02/2016: mild (MPG 16); c.) TTE 11/05/2016: mild (MPG 9); d.) TTE 01/21/2020: mild (MPG 11.7); e.) TTE 09/24/2022: mild-mod (MPG 20.3); f.) TTE 12/23/2022: mild (MPG 15)   Atrial fibrillation (HCC)    a.) CHA2DS2VASc = 6 (age x2, CHF, HTN, vascular disease history, T2DM);  b.) rate/rhythm maintained on oral labetalol; chronically anticoagulated with apixaban + clopidogrel   AV block, 1st degree    B12 deficiency    CKD (chronic  kidney disease), stage III (HCC)    Colon polyps    Congestive heart failure (HCC) 05/01/2023   Coronary artery disease 08/16/2016   a.) s/p 3v CABG 08/30/2016; b.) s/p PCI 11/26/2016 (DES x 1 oSVG-RCA); c.) s/p PCI 12/17/2016 (DES x 5 --> mLCx x2, dLCx, pLAD, dLAD); c.) s/p PCI 06/09/2020 (DES x1 --> ISR oSVG-PDA)   Cyst of kidney, acquired 07/03/2023   Dyspnea     Gastroesophageal reflux disease 05/01/2023   GERD (gastroesophageal reflux disease)    Heart murmur    Hepatic steatosis    History of 2019 novel coronavirus disease (COVID-19) 09/02/2020   a.) Tx'd with remdesivir   History of blood transfusion    History of GI bleed 09/02/2020   a.) admitted to [facility] 09/02/2020 - 09/08/2020   History of heart artery stent 11/26/2016   TOTAL of 7 stents: a.) 11/26/2016 --> 3.5 x 22 mm Resolute Integrity oSVG-RCA; b.) 12/17/2016: 3.0 x 12 mm Xience Alpine dLCx, 3.5 x 22 mm Resolute Onyx mLCx, 3.5 x 18 mm Resolute Onyx mLCx, 3.0 x 26 mm Resolute Onyx pLAD, 2.0 x 26 mm Resolute Onyx dLAD; c.) 06/09/2020 --> 2.5 x 18 mm Resolute Onyx ISR oSVG-PDA   History of partial colectomy 08/12/2012   a.) large gastric hyperplastic polyp removal   HLD (hyperlipidemia)    Hypertension    LBBB (left bundle branch block)    Long term current use of anticoagulant    a.) apixaban   Long term current use of antithrombotics/antiplatelets    a.) clopidogrel   Male hypogonadism    a.) on exogenous TRT (1.62% gel)   Mild cardiomegaly    Mild gynecomastia (bilateral)    Nephrolithiasis    OSA (obstructive sleep apnea)    a.) does not utilize nocturnal PAP therapy   Personal history of surgery to heart and great vessels, presenting hazards to health 05/01/2023   Proteinuria, unspecified 08/01/2023   Pulmonary hypertension (HCC) 09/24/2022   a.) TTE 09/24/2022: RVSP 74.7; b.) TTE 12/23/2022: RVSP 31.3   RBBB (right bundle branch block with left anterior fascicular block)    Right inguinal hernia    Right thyroid nodule    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   a.) LIMA-LAD, SVG-PDA, SVG-OM3   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/01/2023   Type 2 diabetes mellitus treated with insulin (HCC)    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   Umbilical hernia    Unstable angina (HCC)    Vitamin D deficiency    Past Surgical History:  Procedure  Laterality Date   APPENDECTOMY     CARDIAC CATHETERIZATION Left 08/16/2016   Procedure: Left Heart Cath and Coronary Angiography;  Surgeon: Alwyn Pea, MD;  Location: [facility] INVASIVE CV LAB;  Service: Cardiovascular;  Laterality: Left;   COLONOSCOPY N/A 09/07/2020   Procedure: COLONOSCOPY;  Surgeon: Pasty Spillers, MD;  Location: [facility] ENDOSCOPY;  Service: Endoscopy;  Laterality: N/A;   COLONOSCOPY WITH PROPOFOL N/A 05/17/2021   Procedure: COLONOSCOPY WITH PROPOFOL;  Surgeon: Pasty Spillers, MD;  Location: [facility] ENDOSCOPY;  Service: Endoscopy;  Laterality: N/A;   CORONARY ANGIOPLASTY WITH STENT PLACEMENT Left 11/26/2016   Procedure: CORONARY ANGIOPLASTY WITH STENT PLACEMENT; Location: [facility]; Surgeon: Jerrell Mylar, MD   CORONARY ARTERY BYPASS GRAFT N/A 08/30/2016   Procedure: CORONARY ARTERY BYPASS GRAFT; Location: [facility]; Surgeon: Nance Pew, MD   CORONARY STENT INTERVENTION N/A 06/09/2020   Procedure: CORONARY STENT INTERVENTION;  Surgeon: Alwyn Pea,  MD;  Location: [facility] INVASIVE CV LAB;  Service: Cardiovascular;  Laterality: N/A;   CORONARY STENT INTERVENTION Left 12/17/2016   Procedure: CORONARY STENT INTERVENTION; Location: [facility]; Surgeon: Jerrell Mylar, MD   ENDOSCOPIC MUCOSAL RESECTION N/A 09/22/2020   Procedure: ENDOSCOPIC MUCOSAL RESECTION;  Surgeon: Lemar Lofty., MD;  Location[facility] ENDOSCOPY;  Service: Gastroenterology;  Laterality: N/A;   ESOPHAGOGASTRODUODENOSCOPY N/A 09/07/2020   Procedure: ESOPHAGOGASTRODUODENOSCOPY (EGD);  Surgeon: Pasty Spillers, MD;  Location: [facility] ENDOSCOPY;  Service: Endoscopy;  Laterality: N/A;   ESOPHAGOGASTRODUODENOSCOPY (EGD) WITH PROPOFOL N/A 09/22/2020   Procedure: ESOPHAGOGASTRODUODENOSCOPY (EGD) WITH PROPOFOL;  Surgeon: Meridee Score Netty Starring., MD;  Location: [facility] ENDOSCOPY;  Service: Gastroenterology;  Laterality: N/A;   ESOPHAGOGASTRODUODENOSCOPY (EGD) WITH PROPOFOL N/A 08/26/2023   Procedure: ESOPHAGOGASTRODUODENOSCOPY  (EGD) WITH PROPOFOL;  Surgeon: Midge Minium, MD;  Location: [facility] ENDOSCOPY;  Service: Endoscopy;  Laterality: N/A;   FRACTURE SURGERY     HEMOSTASIS CLIP PLACEMENT  09/22/2020   Procedure: HEMOSTASIS CLIP PLACEMENT;  Surgeon: Meridee Score Netty Starring., MD;  Location: [facility] ENDOSCOPY;  Service: Gastroenterology;;   HEMOSTASIS CONTROL  09/22/2020   Procedure: HEMOSTASIS CONTROL;  Surgeon: Lemar Lofty., MD;  Location: [facility] ENDOSCOPY;  Service: Gastroenterology;;   INSERTION OF MESH  02/19/2023   Procedure: INSERTION OF MESH;  Surgeon: Henrene Dodge, MD;  Location: [facility] ORS;  Service: General;;  umbilical   LAPAROSCOPIC PARTIAL RIGHT COLECTOMY Right 08/12/2012   Procedure: LAPAROSCOPIC PARTIAL RIGHT COLECTOMY; Location: [facility]; Surgeon: Renda Rolls, MD   LEFT HEART CATH AND CORONARY ANGIOGRAPHY Left 06/09/2020   Procedure: LEFT HEART CATH AND CORONARY ANGIOGRAPHY;  Surgeon: Alwyn Pea, MD;  Location: [facility] INVAS[facility] CV LAB;  Service: Cardiovascular;  Laterality: Left;   LEFT HEART CATH AND CORS/GRAFTS ANGIOGRAPHY Left 11/20/2016   Procedure: Left Heart Cath and Cors/Grafts Angiography;  Surgeon: Alwyn Pea, MD;  Location: [facility] CV [facility];  Service: Cardiovascular;  Laterality: Left;   POLYPECTOMY  09/22/2020   Procedure: POLYPECTOMY;  Surgeon: Mansouraty, Netty Starring., MD;  Location: [facility] ENDOSCOPY;  Service: Gastroenterology;;   SUBMUCOSAL LIFTING INJECTION  09/22/2020   Procedure: SUBMUCOSAL LIFTING INJECTION;  Surgeon: Lemar Lofty., MD;  Location: [facility] ENDOSCOPY;  Service: Gastroenterology;;   UMBILICAL HERNIA REPAIR N/A 02/19/2023   Procedure: HERNIA REPAIR UMBILICAL ADULT, open;  Surgeon: Henrene Dodge, MD;  Location: [facility] ORS;  Service: General;  Laterality: N/A;   XI ROBOTIC ASSISTED INGUINAL HERNIA REPAIR WITH MESH Right 02/19/2023   Procedure: XI ROBOTIC ASSISTED INGUINAL HERNIA REPAIR WITH MESH;  Surgeon: Henrene Dodge, MD;  Location: [facility] ORS;  Service: General;   Laterality: Right;   Social History:  reports that he quit smoking about 25 years ago. His smoking use included cigarettes. He has never been exposed to tobacco smoke. He has never used smokeless tobacco. He reports that he does not drink alcohol and does not use drugs.  Allergies  Allergen Reactions   Isosorbide Cough   Family History  Problem Relation Age of Onset   Hyperlipidemia Mother    Heart disease Mother    Hypertension Father    Family history: Family history reviewed and not pertinent.  Prior to Admission medications   Medication Sig Start Date End Date Taking? Authorizing Provider  amLODipine (NORVASC) 5 MG tablet Take 5 mg by mouth 2 (two) times daily. 07/03/23 07/02/24  [provider]  apixaban (ELIQUIS) 5 MG TABS tablet Take 1 tablet (5 mg total) by mouth 2 (two) times daily. 12/27/22   Marrion Coy, MD  clopidogrel (PLAVIX)  75 MG tablet Take 75 mg by mouth daily.    [provider]  cyanocobalamin (VITAMIN B12) 1000 MCG tablet Take 1,000 mcg by mouth daily.    [provider]  dexlansoprazole (DEXILANT) 60 MG capsule Take 1 capsule by mouth daily. 12/25/21   [provider]  empagliflozin (JARDIANCE) 10 MG TABS tablet Take 1 tablet (10 mg total) by mouth daily before breakfast. 08/29/23   Agbata, Tochukwu, MD  Ferrous Sulfate (IRON) 325 (65 Fe) MG TABS Take 1 tablet by mouth daily.    [provider]  hydrALAZINE (APRESOLINE) 50 MG tablet Take 1 tablet by mouth 3 (three) times daily. 05/10/21   [provider]  labetalol (NORMODYNE) 100 MG tablet Take 1 tablet by mouth 2 (two) times daily. 06/27/22   [provider]  lactulose (CHRONULAC) 10 GM/15ML solution Taking 15-20 cc by mouth 3 to 4 times a day for constiption 10/08/23   Wyline Mood, MD  nitroGLYCERIN (NITROSTAT) 0.4 MG SL tablet Place 0.4 mg under the tongue every 5 (five) minutes as needed. 07/30/23 07/29/24  [provider]  rosuvastatin  (CRESTOR) 40 MG tablet Take 40 mg by mouth daily. 06/22/20   [provider]  sitaGLIPtin (JANUVIA) 100 MG tablet Take 100 mg by mouth daily.    [provider]  Testosterone 1.62 % GEL Apply 2 Pump topically daily. One pump on each arm 07/22/23   McGowan, Carollee Herter A, PA-C  torsemide (DEMADEX) 20 MG tablet Take 2 tablets (40 mg total) by mouth daily. 08/29/23   Lucile Shutters, MD   Physical Exam: Vitals:   10/16/23 1406 10/16/23 1407 10/16/23 1620 10/16/23 1635  BP:  (!) 117/57 133/69 129/63  Pulse:  71 62 72  Resp: (!) 22  20 20   Temp: 97.7 F (36.5 C)  97.6 F (36.4 C) (!) 97.4 F (36.3 C)  TempSrc:   Oral Oral  SpO2:  93%  98%  Weight:      Height:       Constitutional: appears frail, acutely uncomfortable, dyspneic Eyes: PERRL, lids and conjunctivae normal ENMT: Mucous membranes are dry. Posterior pharynx clear of any exudate or lesions. Age-appropriate dentition. Hearing appropriate.  Left ear with bandage in place status post recent Mohs surgery at [facility] Neck: normal, supple, no masses, no thyromegaly Respiratory: clear to auscultation bilaterally, no wheezing, no crackles. Normal respiratory effort. No accessory muscle use.  Cardiovascular: Regular rate and rhythm, no murmurs / rubs / gallops.  Bilateral lower extremity 3+ pitting edema. 2+ pedal pulses. No carotid bruits.  Abdomen: no tenderness, no masses palpated, no hepatosplenomegaly. Bowel sounds positive.  Musculoskeletal: no clubbing / cyanosis. No joint deformity upper and lower extremities. Good ROM, no contractures, no atrophy. Normal muscle tone.  Skin: no rashes, lesions, ulcers. No induration Neurologic: Sensation intact. Strength 5/5 in all 4.  Psychiatric: Lacks judgment and insight multiple chronic medical conditions. Alert and oriented x 3.  Agitated and depressed mood.   EKG: independently reviewed, showing atrial fibrillation with rate of 71, QTc 502  Chest x-ray on Admission: I personally  reviewed and appreciate right pleural effusion.  Pending formal radiology read at this time. DG Chest 2 View Result Date: 10/16/2023 CLINICAL DATA:  Worsening chest pain and shortness of breath over the past few days, with exertion. Bilateral lower extremity swelling. Clinically diagnosed CHF. EXAM: CHEST - 2 VIEW COMPARISON:  08/23/2023 FINDINGS: The cardiac silhouette remains mildly enlarged. No significant change in small left pleural effusion and moderate right  pleural effusion. Mildly improved bibasilar atelectasis and possible left lower lobe pneumonia. Stable post CABG changes. Tortuous and partially calcified thoracic aorta. Stable sternal fixation hardware. Interval probable zipper overlying the thoracic inlet. IMPRESSION: 1. Mildly improved bibasilar atelectasis and possible left lower lobe pneumonia. 2. No significant change in small left pleural effusion and moderate right pleural effusion. 3. Mild cardiomegaly. 4. Probable zipper overlying the thoracic inlet. Correlation with overlying clothing is recommended to help exclude an ingested foreign body. Electronically Signed   By: Beckie Salts M.D.   On: 10/16/2023 17:01    Labs on Admission: I have personally reviewed following labs CBC: Recent Labs  Lab 10/16/23 1407  WBC 6.1  HGB 6.0*  HCT 20.7*  MCV 108.9*  PLT 163   Basic Metabolic Panel: Recent Labs  Lab 10/16/23 1407  NA 136  K 4.2  CL 99  CO2 23  GLUCOSE 192*  BUN 54*  CREATININE 3.67*  CALCIUM 9.6   GFR: Estimated Creatinine Clearance: 21 mL/min (A) (by C-G formula based on SCr of 3.67 mg/dL (H)).  Urine analysis:    Component Value Date/Time   COLORURINE YELLOW (A) 08/27/2023 1000   APPEARANCEUR CLEAR (A) 08/27/2023 1000   LABSPEC 1.011 08/27/2023 1000   PHURINE 5.0 08/27/2023 1000   GLUCOSEU NEGATIVE 08/27/2023 1000   HGBUR SMALL (A) 08/27/2023 1000   BILIRUBINUR NEGATIVE 08/27/2023 1000   KETONESUR NEGATIVE 08/27/2023 1000   PROTEINUR 100 (A)  08/27/2023 1000   NITRITE NEGATIVE 08/27/2023 1000   LEUKOCYTESUR MODERATE (A) 08/27/2023 1000   CRITICAL CARE Performed by: Dr. Sedalia Muta  Total critical care time: 32 minutes  Critical care time was exclusive of separately billable procedures and treating other patients.  Critical care was necessary to treat or prevent imminent or life-threatening deterioration.  Critical care was time spent personally by me on the following activities: development of treatment plan with patient and son, as well as nursing, discussions with consultants, evaluation of patient's response to treatment, examination of patient, obtaining history from patient or surrogate, ordering and performing treatments and interventions, ordering and review of laboratory studies, ordering and review of radiographic studies, pulse oximetry and re-evaluation of patient's condition.  This document was prepared using Dragon Voice Recognition software and may include unintentional dictation errors.  Dr. Sedalia Muta [facility]  If 7PM-7AM, please contact overnight-coverage provider If 7AM-7PM, please contact day attending provider [email or web address]  10/16/2023, 5:50 PM

## 2023-10-16 NOTE — ED Triage Notes (Signed)
 Pt to ED For shob chest pain worsening over past few days with exertion. BLE swelling. +CHF

## 2023-10-16 NOTE — Assessment & Plan Note (Addendum)
 GI consulted, Protonix twice daily, PRBC ordered for transfusion per EDP Peripheral IV (please ensure and maintain 2 peripheral IV) ordered

## 2023-10-16 NOTE — Assessment & Plan Note (Addendum)
 Ultrasound-guided paracentesis pleural effusion on the right side ordered on admission Strict Is and Os

## 2023-10-16 NOTE — Assessment & Plan Note (Signed)
 Rosuvastatin 40 mg nightly resumed Plavix, Eliquis will not be resumed on admission in setting of suspected upper GI bleed with severe acute on chronic anemia

## 2023-10-16 NOTE — Assessment & Plan Note (Addendum)
 Home amlodipine 5 mg p.o. twice daily, labetalol 100 mg p.o. twice daily, hydralazine 50 mg 3 times daily, torsemide 40 mg daily not resumed on admission as patient is low normotensive on admission AM team to resume home antihypertensive medication when the benefits outweigh the risk Hydralazine 5 mg IV every 6 hours as needed for SBP greater 165, 5 days ordered

## 2023-10-16 NOTE — Consult Note (Signed)
 GI Inpatient Consult Note  Reason for Consult: Melena, symptomatic anemia    Attending Requesting Consult: Dr. Londell Moh, DO  History of Present Illness: [name] is a 78 y.o. male seen for evaluation of melena concerning for GI bleed in setting of symptomatic anemia at the request of admitting hospitalist - Dr. Londell Moh. Patient has a PMH of HTN, HLD, OSA on CPAP, T2DM, GERD, CKD Stage IIIa, hypothyroidism, PAF on chronic anticoagulation, nonalcoholic cirrhosis, CAD s/p CABG s/p PCI/DES, dCHF, and anemia of chronic disease. He presented to the [facility] ED this afternoon for chief complaint of shortness of breath and chest pain. Upon presentation to the ED, temperature 97.7, RR 22, HR 71-107, BP 117/57, and O2 sats 93% on room air. Labs significant for hemoglobin 6.0, WBC 6.1K, BUN 54, serum creatinine 3.67, GFR 16, potassium 4.2, sodium 136, and BNP 942.6 He received orders for 2 units pRBCs. Per ED physician, DRE showed dark stool which was guaiac positive. Baseline hemoglobin 8-8.5. He was admitted to the unit and GI consulted for further evaluation and management.   Patient seen and examined bedside in the ED. His son, [name], is present in room with him. He is receiving the first unit of blood. He is tolerating clear liquids without any issues. He denies any bowel movements so far today. He reports last BM was yesterday morning and was dark per his report. He reports he has had issues with dark stools since being discharged from the hospital in January. He denies any frank hematochezia. He denies any abdominal pain, abdominal cramping, nausea, vomiting, dysphagia, GERD symptoms, or odynophagia. He had EGD in January 2025 performed by Dr. Servando Snare which commented on multiple non-bleeding gastric polyps and otherwise normal esophagus and duodenum. He is on both Eliquis for PAF and Plavix for hx of stenting. Last dose of Plavix was this morning and last dose of Eliquis was yesterday evening before  bed. He had to have EGD in 2022 in [place] due to bleeding gastric polyps. He is s/p Mohs surgery of left ear earlier today at [facility].    Summary of GI Procedures:  EGD 08/26/2023 Servando Snare) - normal esophagus, multiple pedunculated and sessile polyps without bleeding found in entire examined stomach, normal duodenum  CSY 05/17/2021 Maximino Greenland) - normal ileocolonic anastomosis , two 4-6 mm polyps removed from transverse colon (TA x1 and hyperplastic polyp x1), non-bleeding IH, hypertrophied anal papillae  EGD 09/2020 (Mansouraty) - multiple hyperplastic gastric polyps. 5 actively oozing polyps were removed, 1 with mucosal resection, and 4 with saline lift polypectomy. Clips were placed. Widely patent Schatzki's ring. 2 cm hiatal hernia. Pathology of gastric polyps showed benign hyperplastic polyps. Negative for intestinal metaplasia or dysplasia.    Past Medical History:  Past Medical History:  Diagnosis Date   (HFpEF) heart failure with preserved ejection fraction (HCC) 07/25/2016   a.)TTE 07/25/16: EF >55, triv-mild pan regur, mild AS, G2DD; b.)TTE 09/02/16: EF >55, triv TR, mild AS; c.)TTE 11/05/16: EF >55, LAE, triv-mild pan regur, mild AS, G2DD; d.)TTE 01/21/20: EF >55, mod LVH, LAE/RVE, triv-mild AR/PR/TR, mod MR, mild AS, G2DD; e.)TTE 09/24/22: EF>55, sev LVH, sev LAE, RAE, mild PR, mod AR/MR/TR, mild-mod AS, RVSP 74.7; f.)TTE 12/23/22: EF 60-65, LAE, mild-mod MR/TR, mild AS   Abnormal urine 05/01/2023   Anemia    Aortic atherosclerosis (HCC)    Aortic stenosis 07/25/2016   a.) TTE 07/25/2016: mild (MPG 13.5); b.) TTE 09/02/2016: mild (MPG 16); c.) TTE 11/05/2016: mild (MPG 9); d.)  TTE 01/21/2020: mild (MPG 11.7); e.) TTE 09/24/2022: mild-mod (MPG 20.3); f.) TTE 12/23/2022: mild (MPG 15)   Atrial fibrillation (HCC)    a.) CHA2DS2VASc = 6 (age x2, CHF, HTN, vascular disease history, T2DM);  b.) rate/rhythm maintained on oral labetalol; chronically anticoagulated with apixaban + clopidogrel   AV  block, 1st degree    B12 deficiency    CKD (chronic kidney disease), stage III (HCC)    Colon polyps    Congestive heart failure (HCC) 05/01/2023   Coronary artery disease 08/16/2016   a.) s/p 3v CABG 08/30/2016; b.) s/p PCI 11/26/2016 (DES x 1 oSVG-RCA); c.) s/p PCI 12/17/2016 (DES x 5 --> mLCx x2, dLCx, pLAD, dLAD); c.) s/p PCI 06/09/2020 (DES x1 --> ISR oSVG-PDA)   Cyst of kidney, acquired 07/03/2023   Dyspnea    Gastroesophageal reflux disease 05/01/2023   GERD (gastroesophageal reflux disease)    Heart murmur    Hepatic steatosis    History of 2019 novel coronavirus disease (COVID-19) 09/02/2020   a.) Tx'd with remdesivir   History of blood transfusion    History of GI bleed 09/02/2020   a.) admitted to [facility] 09/02/2020 - 09/08/2020   History of heart artery stent 11/26/2016   TOTAL of 7 stents: a.) 11/26/2016 --> 3.5 x 22 mm Resolute Integrity oSVG-RCA; b.) 12/17/2016: 3.0 x 12 mm Xience Alpine dLCx, 3.5 x 22 mm Resolute Onyx mLCx, 3.5 x 18 mm Resolute Onyx mLCx, 3.0 x 26 mm Resolute Onyx pLAD, 2.0 x 26 mm Resolute Onyx dLAD; c.) 06/09/2020 --> 2.5 x 18 mm Resolute Onyx ISR oSVG-PDA   History of partial colectomy 08/12/2012   a.) large gastric hyperplastic polyp removal   HLD (hyperlipidemia)    Hypertension    LBBB (left bundle branch block)    Long term current use of anticoagulant    a.) apixaban   Long term current use of antithrombotics/antiplatelets    a.) clopidogrel   Male hypogonadism    a.) on exogenous TRT (1.62% gel)   Mild cardiomegaly    Mild gynecomastia (bilateral)    Nephrolithiasis    OSA (obstructive sleep apnea)    a.) does not utilize nocturnal PAP therapy   Personal history of surgery to heart and great vessels, presenting hazards to health 05/01/2023   Proteinuria, unspecified 08/01/2023   Pulmonary hypertension (HCC) 09/24/2022   a.) TTE 09/24/2022: RVSP 74.7; b.) TTE 12/23/2022: RVSP 31.3   RBBB (right bundle branch block with left anterior  fascicular block)    Right inguinal hernia    Right thyroid nodule    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   a.) LIMA-LAD, SVG-PDA, SVG-OM3   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/01/2023   Type 2 diabetes mellitus treated with insulin (HCC)    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   Umbilical hernia    Unstable angina (HCC)    Vitamin D deficiency     Problem List: Patient Active Problem List   Diagnosis Date Noted   Acute exacerbation of CHF (congestive heart failure) (HCC) 10/16/2023   Severe anemia 10/16/2023   Melena 08/26/2023   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   CHF exacerbation (HCC) 08/22/2023   Upper GI bleeding 08/22/2023   Recurrent right pleural effusion 08/22/2023   Symptomatic anemia 08/22/2023   Status post Mohs surgery 05/01/2023   Right inguinal hernia 02/19/2023   Umbilical hernia without obstruction and without gangrene 02/19/2023   Hyponatremia 12/26/2022   Acute  kidney injury superimposed on chronic kidney disease (HCC) 12/26/2022   Severe pulmonary hypertension (HCC) 12/26/2022   Vitamin D deficiency 12/26/2022   Acute respiratory failure with hypoxia (HCC) 12/26/2022   Severe mitral insufficiency 12/26/2022   Severe tricuspid regurgitation 12/26/2022   Acute heart failure with preserved ejection fraction (HFpEF) (HCC) 12/20/2022   Chronic venous stasis 12/20/2022   Paroxysmal atrial fibrillation (HCC) 12/20/2022   Iron deficiency anemia 05/17/2021   Thyroid nodule 11/04/2020   Lymphedema 10/27/2020   Gastric mass    Polyp of sigmoid colon    Acute blood loss anemia 09/04/2020   COVID-19 virus infection    Iron deficiency anemia due to chronic blood loss    Hypercalcemia    Pica    Acute on chronic blood loss anemia 09/02/2020   Hyperlipidemia associated with type 2 diabetes mellitus (HCC) 09/02/2020   S/P drug eluting coronary stent placement 06/09/2020   Other hyperlipidemia 08/18/2019   Insulin  dependent type 2 diabetes mellitus (HCC) 01/14/2017   Essential hypertension 01/14/2017   OSA (obstructive sleep apnea) 01/14/2017   At risk for fluid volume overload 09/08/2016   BBB (bundle branch block) 08/30/2016   Obesity with body mass index (BMI) of 30.0 to 39.9 08/30/2016   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   1st degree AV block 08/30/2016   CAD (coronary artery disease), native coronary artery 08/20/2016   History of colonic polyps 11/05/2013    Past Surgical History: Past Surgical History:  Procedure Laterality Date   APPENDECTOMY     CARDIAC CATHETERIZATION Left 08/16/2016   Procedure: Left Heart Cath and Coronary Angiography;  Surgeon: Alwyn Pea, MD;  Location: [facility] INVASIVE CV LAB;  Service: Cardiovascular;  Laterality: Left;   COLONOSCOPY N/A 09/07/2020   Procedure: COLONOSCOPY;  Surgeon: Pasty Spillers, MD;  Location: [facility] ENDOSCOPY;  Service: Endoscopy;  Laterality: N/A;   COLONOSCOPY WITH PROPOFOL N/A 05/17/2021   Procedure: COLONOSCOPY WITH PROPOFOL;  Surgeon: Pasty Spillers, MD;  Location: [facility] ENDOSCOPY;  Service: Endoscopy;  Laterality: N/A;   CORONARY ANGIOPLASTY WITH STENT PLACEMENT Left 11/26/2016   Procedure: CORONARY ANGIOPLASTY WITH STENT PLACEMENT; Location: [facility]; Surgeon: Jerrell Mylar, MD   CORONARY ARTERY BYPASS GRAFT N/A 08/30/2016   Procedure: CORONARY ARTERY BYPASS GRAFT; Location: [facility]; Surgeon: Nance Pew, MD   CORONARY STENT INTERVENTION N/A 06/09/2020   Procedure: CORONARY STENT INTERVENTION;  Surgeon: Alwyn Pea, MD;  Location: [facility] INVASIVE CV LAB;  Service: Cardiovascular;  Laterality: N/A;   CORONARY STENT INTERVENTION Left 12/17/2016   Procedure: CORONARY STENT INTERVENTION; Location: [facility]; Surgeon: Jerrell Mylar, MD   ENDOSCOPIC MUCOSAL RESECTION N/A 09/22/2020   Procedure: ENDOSCOPIC MUCOSAL RESECTION;  Surgeon: Lemar Lofty., MD;  Location: [facility] ENDOSCOPY;  Service: Gastroenterology;  Laterality: N/A;    ESOPHAGOGASTRODUODENOSCOPY N/A 09/07/2020   Procedure: ESOPHAGOGASTRODUODENOSCOPY (EGD);  Surgeon: Pasty Spillers, MD;  Location: [facility] ENDOSCOPY;  Service: Endoscopy;  Laterality: N/A;   ESOPHAGOGASTRODUODENOSCOPY (EGD) WITH PROPOFOL N/A 09/22/2020   Procedure: ESOPHAGOGASTRODUODENOSCOPY (EGD) WITH PROPOFOL;  Surgeon: Meridee Score Netty Starring., MD;  Location: [facility] ENDOSCOPY;  Service: Gastroenterology;  Laterality: N/A;   ESOPHAGOGASTRODUODENOSCOPY (EGD) WITH PROPOFOL N/A 08/26/2023   Procedure: ESOPHAGOGASTRODUODENOSCOPY (EGD) WITH PROPOFOL;  Surgeon: Midge Minium, MD;  Location: [facility] ENDOSCOPY;  Service: Endoscopy;  Laterality: N/A;   FRACTURE SURGERY     HEMOSTASIS CLIP PLACEMENT  09/22/2020   Procedure: HEMOSTASIS CLIP PLACEMENT;  Surgeon: Lemar Lofty., MD;  Location: [facility] ENDOSCOPY;  Service: Gastroenterology;;   HEMOSTASIS CONTROL  09/22/2020  Procedure: HEMOSTASIS CONTROL;  Surgeon: Meridee Score Netty Starring., MD;  Location: [facility] ENDOSCOPY;  Service: Gastroenterology;;   INSERTION OF MESH  02/19/2023   Procedure: INSERTION OF MESH;  Surgeon: Henrene Dodge, MD;  Location: [facility] ORS;  Service: General;;  umbilical   LAPAROSCOPIC PARTIAL RIGHT COLECTOMY Right 08/12/2012   Procedure: LAPAROSCOPIC PARTIAL RIGHT COLECTOMY; Location: [facility]; Surgeon: Renda Rolls, MD   LEFT HEART CATH AND CORONARY ANGIOGRAPHY Left 06/09/2020   Procedure: LEFT HEART CATH AND CORONARY ANGIOGRAPHY;  Surgeon: Alwyn Pea, MD;  Location: [facility] INVASIVE CV LAB;  Service: Cardiovascular;  Laterality: Left;   LEFT HEART CATH AND CORS/GRAFTS ANGIOGRAPHY Left 11/20/2016   Procedure: Left Heart Cath and Cors/Grafts Angiography;  Surgeon: Alwyn Pea, MD;  Location: [facility] INVASIVE CV LAB;  Service: Cardiovascular;  Laterality: Left;   POLYPECTOMY  09/22/2020   Procedure: POLYPECTOMY;  Surgeon: Mansouraty, Netty Starring., MD;  Location: [facility] ENDOSCOPY;  Service: Gastroenterology;;   SUBMUCOSAL LIFTING INJECTION   09/22/2020   Procedure: SUBMUCOSAL LIFTING INJECTION;  Surgeon: Lemar Lofty., MD;  Location: [facility] ENDOSCOPY;  Service: Gastroenterology;;   UMBILICAL HERNIA REPAIR N/A 02/19/2023   Procedure: HERNIA REPAIR UMBILICAL ADULT, open;  Surgeon: Henrene Dodge, MD;  Location: [facility] ORS;  Service: General;  Laterality: N/A;   XI ROBOTIC ASSISTED INGUINAL HERNIA REPAIR WITH MESH Right 02/19/2023   Procedure: XI ROBOTIC ASSISTED INGUINAL HERNIA REPAIR WITH MESH;  Surgeon: Henrene Dodge, MD;  Location: [facility] ORS;  Service: General;  Laterality: Right;    Allergies: Allergies  Allergen Reactions   Isosorbide Cough    Home Medications: (Not in a hospital admission)  Home medication reconciliation was completed with the patient.   Scheduled Inpatient Medications:    pantoprazole (PROTONIX) IV  80 mg Intravenous BID    Continuous Inpatient Infusions:    PRN Inpatient Medications:  acetaminophen **OR** acetaminophen, ondansetron **OR** ondansetron (ZOFRAN) IV  Family History: family history includes Heart disease in his mother; Hyperlipidemia in his mother; Hypertension in his father.  The patient's family history is negative for inflammatory bowel disorders, GI malignancy, or solid organ transplantation.  Social History:   reports that he quit smoking about 25 years ago. His smoking use included cigarettes. He has never been exposed to tobacco smoke. He has never used smokeless tobacco. He reports that he does not drink alcohol and does not use drugs. The patient denies ETOH, tobacco, or drug use.   Review of Systems: Constitutional: Weight is stable.  Eyes: No changes in vision. ENT: No oral lesions, sore throat.  GI: see HPI.  Heme/Lymph: No easy bruising.  CV: No chest pain.  GU: No hematuria.  Integumentary: No rashes.  Neuro: No headaches.  Psych: No depression/anxiety.  Endocrine: No heat/cold intolerance.  Allergic/Immunologic: No urticaria.  Resp: No cough, SOB.   Musculoskeletal: No joint swelling.    Physical Examination: BP (!) 117/57   Pulse 71   Temp 97.7 F (36.5 C)   Resp (!) 22   Ht 6\' 1"  (1.854 m)   Wt 97.5 kg   SpO2 93%   BMI 28.37 kg/m  Gen: NAD, alert and oriented x 4 HEENT: PEERLA, EOMI, Neck: supple, no JVD or thyromegaly Chest: CTA bilaterally, no wheezes, crackles, or other adventitious sounds CV: RRR, no m/g/c/r Abd: soft, NT, ND, +BS in all four quadrants; no HSM, guarding, ridigity, or rebound tenderness Ext: no edema, well perfused with 2+ pulses, Skin: no rash or lesions noted Lymph: no LAD  Data: Lab Results  Component Value  Date   WBC 6.1 10/16/2023   HGB 6.0 (L) 10/16/2023   HCT 20.7 (L) 10/16/2023   MCV 108.9 (H) 10/16/2023   PLT 163 10/16/2023   Recent Labs  Lab 10/16/23 1407  HGB 6.0*   Lab Results  Component Value Date   NA 136 10/16/2023   K 4.2 10/16/2023   CL 99 10/16/2023   CO2 23 10/16/2023   BUN 54 (H) 10/16/2023   CREATININE 3.67 (H) 10/16/2023   Lab Results  Component Value Date   ALT 17 08/23/2023   AST 18 08/23/2023   ALKPHOS 85 08/23/2023   BILITOT 1.5 (H) 08/23/2023   No results for input(s): "APTT", "INR", "PTT" in the last 168 hours.  Assessment/Plan:  78 y/o Caucasian male with a PMH of HTN, HLD, OSA on CPAP, T2DM, GERD, CKD Stage IIIa, hypothyroidism, PAF on chronic anticoagulation, nonalcoholic cirrhosis, CAD s/p CABG s/p PCI/DES, dCHF, and anemia of chronic disease presented to the [facility] ED 3/5 for chief complaint of shortness of breath and chest pain found to have significant anemia (hemoglobin 6.0), CHF exacerbation, and recurrent right pleural effusion.   Symptomatic anemia/UGIB - hemoglobin 6.0 with reported history of melena is concerning for UGIB. DDx includes gastric polyps, PUD, gastritis, AVMs, esophagitis, duodenitis, neoplasm, etc  Melena/Dark stools  Acute on chronic diastolic CHF  Recurrent right pleural effusion  CAD s/p CABG s/p PCI/DES - last dose  Plavix AM 3/5  Acute on chronic kidney disease  Status post Mohs surgery 10/16/23  Recommendations:  - Maintain 2 large bore IVs for access - Transfuse 2 units pRBCs as ordered - No signs of overt gastrointestinal blood loss at this time - Continue to monitor for signs of bleeding - Continue PPI for gastric protection - Supportive care per primary team - Continue management of CHF exacerbation, recurrent right pleural effusion, and AKI per primary team and Cardiology - Hold Plavix and Eliquis. Avoid NSAIDs.  - EGD when clinically feasible (ideally after Plavix washout 3-5 days) - Clear liquid diet and ADAT for now - Dr. Servando Snare will take over patient's care starting tomorrow. If questions or concerns arise, please call Dr. Norma Fredrickson.   Thank you for the consult. Please call with questions or concerns.  Gilda Crease, PA-C [facility] Gastroenterology [phone]

## 2023-10-16 NOTE — Assessment & Plan Note (Signed)
 Patient last took Plavix this a.m. and Eliquis nightly at 10/15/2023

## 2023-10-16 NOTE — Assessment & Plan Note (Addendum)
 Per EDP, fecal occult was positive There is suspicion for GI bleed Protonix 80 mg IV twice daily ordered on admission Clear liquid diet GI service has been consulted via secure chat and Epic order 1 unit PRBC has been ordered for transfusion

## 2023-10-16 NOTE — Progress Notes (Signed)
 Patient arrived to room via stretcher from Emergency Department accompanied by ED RN and ED tech. Blood transfusing upon arrival. Patient has dressing to left ear covering "cancer removal" per patient. Otherwise patient skin intact.   Patient alert and oriented to person, place, time and situation. VSS. Bed locked in lowest position. Call bell within reach.

## 2023-10-16 NOTE — Assessment & Plan Note (Addendum)
 Status post Mohs surgery of the left ear today, 10/16/2023 at the [facility] Patient was prescribed cephalexin 500 mg p.o. twice daily to complete a 7-day course Ceftriaxone 2 g IV daily, 7 days course ordered on admission Patient states that son can bring him instructions from dermatology clinic at [address] [facility] for wound care management.  At bedside he states that the bandages will remain in place for 24 hours. Wound care consulted

## 2023-10-17 ENCOUNTER — Inpatient Hospital Stay

## 2023-10-17 DIAGNOSIS — D649 Anemia, unspecified: Secondary | ICD-10-CM

## 2023-10-17 LAB — BASIC METABOLIC PANEL
Anion gap: 9 (ref 5–15)
BUN: 51 mg/dL — ABNORMAL HIGH (ref 8–23)
CO2: 25 mmol/L (ref 22–32)
Calcium: 9.8 mg/dL (ref 8.9–10.3)
Chloride: 104 mmol/L (ref 98–111)
Creatinine, Ser: 3.64 mg/dL — ABNORMAL HIGH (ref 0.61–1.24)
GFR, Estimated: 17 mL/min — ABNORMAL LOW (ref >60)
Glucose, Bld: 150 mg/dL — ABNORMAL HIGH (ref 70–99)
Potassium: 4.5 mmol/L (ref 3.5–5.1)
Sodium: 138 mmol/L (ref 135–145)

## 2023-10-17 LAB — CBC
HCT: 23.2 % — ABNORMAL LOW (ref 39.0–52.0)
Hemoglobin: 7.2 g/dL — ABNORMAL LOW (ref 13.0–17.0)
MCH: 31.2 pg (ref 26.0–34.0)
MCHC: 31 g/dL (ref 30.0–36.0)
MCV: 100.4 fL — ABNORMAL HIGH (ref 80.0–100.0)
Platelets: 145 10*3/uL — ABNORMAL LOW (ref 150–400)
RBC: 2.31 MIL/uL — ABNORMAL LOW (ref 4.22–5.81)
RDW: 16.9 % — ABNORMAL HIGH (ref 11.5–15.5)
WBC: 6 10*3/uL (ref 4.0–10.5)
nRBC: 0 % (ref 0.0–0.2)

## 2023-10-17 MED ORDER — LABETALOL HCL 100 MG PO TABS
100.0000 mg | ORAL_TABLET | Freq: Two times a day (BID) | ORAL | Status: DC
  Administered 2023-10-17 – 2023-10-18 (×3): 100 mg via ORAL
  Filled 2023-10-17 (×3): qty 1

## 2023-10-17 MED ORDER — BOOST / RESOURCE BREEZE PO LIQD CUSTOM
1.0000 | Freq: Three times a day (TID) | ORAL | Status: DC
  Administered 2023-10-17 – 2023-10-18 (×4): 1 via ORAL

## 2023-10-17 MED ORDER — LIDOCAINE HCL (PF) 1 % IJ SOLN
10.0000 mL | Freq: Once | INTRAMUSCULAR | Status: AC
  Administered 2023-10-17: 10 mL via INTRADERMAL

## 2023-10-17 MED ORDER — SODIUM CHLORIDE 0.9 % IV SOLN
2.0000 g | INTRAVENOUS | Status: DC
  Administered 2023-10-17 – 2023-10-18 (×3): 2 g via INTRAVENOUS
  Filled 2023-10-17 (×3): qty 20

## 2023-10-17 MED ORDER — FUROSEMIDE 10 MG/ML IJ SOLN
40.0000 mg | Freq: Once | INTRAMUSCULAR | Status: AC
  Administered 2023-10-17: 40 mg via INTRAVENOUS
  Filled 2023-10-17: qty 4

## 2023-10-17 NOTE — Progress Notes (Signed)
 Jerry Minium, MD [facility]   [address] [place], [place] [address] Phone: [phone] Fax : [phone]   Subjective: Patient is sitting up in bed in no apparent distress.  He had 2.2 L of clear dark yellow fluid taken off for thoracentesis on the right today by interventional radiology.  The patient reports that he took his Plavix yesterday.  He reports that he has not had a bowel movement today.  He has been black stools recently.  Patient has been transfused since admission and his hemoglobin now 7.2 with a hemoglobin of 6.0 yesterday.  He denies any abdominal pain nausea vomiting fevers or chills.   Objective: Vital signs in last 24 hours: Vitals:   10/17/23 0815 10/17/23 0952 10/17/23 1010 10/17/23 1202  BP: 128/61 (!) 122/55 (!) 127/58 114/73  Pulse: 62   84  Resp: 16 16  16   Temp: 98.2 F (36.8 C)   (!) 97.5 F (36.4 C)  TempSrc: Oral   Oral  SpO2: 97% 95% 97% 96%  Weight:      Height:       Weight change:   Intake/Output Summary (Last 24 hours) at 10/17/2023 1434 Last data filed at 10/16/2023 2337 Gross per 24 hour  Intake 513 ml  Output 1 ml  Net 512 ml     Exam: Heart:: Regular rate and rhythm or without murmur or extra heart sounds Lungs: normal and clear to auscultation and percussion Abdomen: soft, nontender, normal bowel sounds   Lab Results: @LABTEST2 @ Micro Results: No results found for this or any previous visit (from the past 240 hours). Studies/Results: DG Chest Port 1 View Result Date: 10/17/2023 CLINICAL DATA:  Follow up pleural effusion following right-sided thoracentesis. EXAM: PORTABLE CHEST 1 VIEW COMPARISON:  Radiographs 10/16/2023 and 08/23/2023.  CT 08/20/2023. FINDINGS: 1015 hours. The heart size and mediastinal contours are stable post median sternotomy and CABG. Right pleural effusion has mildly decreased in volume. Small left pleural effusion and bibasilar pulmonary opacities are unchanged. No evidence of pneumothorax. The bones  appear unremarkable. IMPRESSION: Mild decrease in right pleural effusion following thoracentesis. No evidence of pneumothorax. Electronically Signed   By: Carey Bullocks M.D.   On: 10/17/2023 11:30   US THORACENTESIS ASP PLEURAL SPACE W/IMG GUIDE Result Date: 10/17/2023 INDICATION: Shortness of breath. Large right pleural effusion. Request for therapeutic thoracentesis. EXAM: ULTRASOUND GUIDED RIGHT THORACENTESIS MEDICATIONS: 1% plain lidocaine, 5 mL COMPLICATIONS: None immediate. PROCEDURE: An ultrasound guided thoracentesis was thoroughly discussed with the patient and questions answered. The benefits, risks, alternatives and complications were also discussed. The patient understands and wishes to proceed with the procedure. Written consent was obtained. Ultrasound was performed to localize and mark an adequate pocket of fluid in the right chest. The area was then prepped and draped in the normal sterile fashion. 1% Lidocaine was used for local anesthesia. Under ultrasound guidance a 6 Fr Safe-T-Centesis catheter was introduced. Thoracentesis was performed. The catheter was removed and a dressing applied. FINDINGS: A total of approximately 2.2 L of clear, dark yellow fluid was removed. IMPRESSION: Successful ultrasound guided right thoracentesis yielding 2.2 L of pleural fluid. Procedure performed by Brayton El PA-C and supervised by Dr. Irish Lack Electronically Signed   By: Irish Lack M.D.   On: 10/17/2023 11:00   DG Chest 2 View Result Date: 10/16/2023 CLINICAL DATA:  Worsening chest pain and shortness of breath over the past few days, with exertion. Bilateral lower extremity swelling. Clinically diagnosed CHF. EXAM: CHEST -  2 VIEW COMPARISON:  08/23/2023 FINDINGS: The cardiac silhouette remains mildly enlarged. No significant change in small left pleural effusion and moderate right pleural effusion. Mildly improved bibasilar atelectasis and possible left lower lobe pneumonia. Stable post CABG  changes. Tortuous and partially calcified thoracic aorta. Stable sternal fixation hardware. Interval probable zipper overlying the thoracic inlet. IMPRESSION: 1. Mildly improved bibasilar atelectasis and possible left lower lobe pneumonia. 2. No significant change in small left pleural effusion and moderate right pleural effusion. 3. Mild cardiomegaly. 4. Probable zipper overlying the thoracic inlet. Correlation with overlying clothing is recommended to help exclude an ingested foreign body. Electronically Signed   By: Beckie Salts M.D.   On: 10/16/2023 17:01   Medications: I have reviewed the patient's current medications. Scheduled Meds:  cyanocobalamin  1,000 mcg Oral Daily   feeding supplement  1 Container Oral TID BM   furosemide  40 mg Intravenous Once   labetalol  100 mg Oral BID   pantoprazole (PROTONIX) IV  80 mg Intravenous BID   rosuvastatin  40 mg Oral QHS   Continuous Infusions:  cefTRIAXone (ROCEPHIN)  IV Stopped (10/17/23 0135)   PRN Meds:.acetaminophen **OR** acetaminophen, hydrALAZINE, lactulose, ondansetron **OR** ondansetron (ZOFRAN) IV   Assessment: Principal Problem:   Severe anemia Active Problems:   Essential hypertension   OSA (obstructive sleep apnea)   S/P CABG x 3   CAD (coronary artery disease), native coronary artery   Other hyperlipidemia   S/P drug eluting coronary stent placement   Acute kidney injury superimposed on chronic kidney disease (HCC)   Severe pulmonary hypertension (HCC)   Status post Mohs surgery   Upper GI bleed   Recurrent right pleural effusion   Symptomatic anemia   Acute exacerbation of CHF (congestive heart failure) (HCC)   Dyspnea on exertion    Plan: Become today with a history of dark stools but had a history of iron deficiency anemia.  The patient is anemia has been worsening over the last few months that he has been giving IV iron by hematology.  The patient had a CT scan in 2022 with a tubular adenoma had an EGD and  January of this year showing multiple gastric polyps without any sign of any GI bleeding.  The patient took his Plavix yesterday and then recommend having a Plavix washout the next 2 days prior to any endoscopic procedures.  The patient also seems concerned about doing it too soon since he has Plavix on board.  The patient will likely need a repeat upper endoscopy this weekend.   LOS: 1 day   Jerry Minium, MD.FACG 10/17/2023, 2:34 PM Pager [phone] 7am-5pm  Check AMION for 5pm -7am coverage and on weekends

## 2023-10-17 NOTE — Plan of Care (Signed)

## 2023-10-17 NOTE — Progress Notes (Signed)
 PROGRESS NOTE    [name]   ZOX:[id number] DOB: Sep 04, 1945  DOA: 10/16/2023 Date of Service: 10/17/23 which is hospital day 1  PCP: Jaclyn Shaggy, MD    Hospital course / significant events:   HPI: [name] is a 78 year old male with history of paroxysmal atrial fibrillation on Eliquis, heart failure preserved ejection fraction, hypertension, obstructive sleep apnea, hyperlipidemia, CAD status post CABG and DES, history of liver cirrhosis, symptomatic anemia, pulmonary hypertension, who presents emergency department for chief concerns of shortness of breath and chest pain. He reports he has had ongoing shortness of breath for the last few months and it has been worsening over the last few days.  He reports that shortness of breath is worse with exertion. He has been having black stool since January and this has not resolved. Of note, had EGD at that time 01/13 - normal esophagus, multiple pedunculated and sessile polyps without bleeding found in entire examined stomach, normal duodenum    03/05: to ED, Hgb 6.0, 1 unit PRBC ordered and GI consulted - likely for EGD after Palvix washout. Admitted to hospitalist.  03/06: no improvement renal function, Hgb 7.2, thoracentesis --> 2.2 L. Pt reports worsening edema, will trial lasix x1, pt aware may need to hold this if renal function worsening. GI eval pending.      Consultants:  Gastroenterology   Procedures/Surgeries: 10/17/23 thoracentesis       ASSESSMENT & PLAN:   Severe anemia ABLA d/t GI bleed S/p 2 unit PRBC 03/05 Protonix 80 mg IV bid Hold Plavix and Eliquis. Avoid NSAIDs.  EGD when clinically feasible (ideally after Plavix washout 3-5 days)  Clear liquid diet pend GI recs  GI following Follow CBC  Recurrent/chronic pleural effusion Had thoracentsis 08/2023 also S/p thoracentesis 10/17/23 removed 2/2 L Strict Is and Os  Will trial diuresis w/ close monitoring renal function   CAD (coronary artery  disease), native coronary artery  Hx CABG 2018 S/P drug eluting coronary stent placement Patient last took Plavix 10/16/23 a.m. and Eliquis 10/15/23 p.m. 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 twice daily to complete a 7-day course Ceftriaxone 2 g IV daily, 7 days course ordered on admission Patient states that son can bring him instructions from dermatology clinic at [facility] for wound care management.  At bedside he states that the bandages will remain in place for 24 hours. Wound care consulted    Hyperlipidemia Home rosuvastatin 40 mg nightly resumed   OSA (obstructive sleep apnea) CPAP nightly declined by patient, states he does not use this at home    Essential hypertension Home amlodipine 5 mg p.o. twice daily, labetalol 100 mg p.o. twice daily, hydralazine 50 mg 3 times daily, torsemide 40 mg daily not resumed on admission as patient is low normotensive on admission Resume beta blocker  Hydralazine 5 mg IV every 6 hours as needed for SBP greater 165, 5 days ordered  overweight based on BMI: Body mass index is 28.37 kg/m.  Underweight - under 18  overweight - 25 to 29 obese - 30 or more Class 1 obesity: BMI of 30.0 to 34 Class 2 obesity: BMI of 35.0 to 39 Class 3 obesity: BMI of 40.0 to 49 Super Morbid Obesity: BMI  50-59 Super-super Morbid Obesity: BMI 60+ Significantly low or high BMI is associated with higher medical risk.  Weight management advised as adjunct to other disease management and risk reduction treatments    DVT prophylaxis: no Rx ppx d/t bleed risk  IV fluids: no continuous IV fluids  Nutrition: CLD pending GI recs  Central lines / invasive devices: none  Code Status: FULL CODE ACP documentation reviewed:  none on file in [facility]  TOC needs: none at this time, anticipate will need home health Barriers to dispo / significant pending items: GI bleed, monitoring anemia / renal fxn, GI consult pending, expect emdical stability in few more days / pending EGD              Subjective / Brief ROS:  Patient reports edema in lower extremities is worse today, some skin cracking  Denies CP/SOB - SOB improved following thoracentesis  Pain controlled.  Denies new weakness.  Tolerating diet, asking about solid foods .  Reports no concerns w/ urination/defecation.   Family Communication: none at this time     Objective Findings:  Vitals:   10/17/23 0815 10/17/23 0952 10/17/23 1010 10/17/23 1202  BP: 128/61 (!) 122/55 (!) 127/58 114/73  Pulse: 62   84  Resp: 16 16  16   Temp: 98.2 F (36.8 C)   (!) 97.5 F (36.4 C)  TempSrc: Oral   Oral  SpO2: 97% 95% 97% 96%  Weight:      Height:        Intake/Output Summary (Last 24 hours) at 10/17/2023 1409 Last data filed at 10/16/2023 2337 Gross per 24 hour  Intake 513 ml  Output 1 ml  Net 512 ml   Filed Weights   10/16/23 1405  Weight: 97.5 kg    Examination:  Physical Exam Constitutional:      General: He is not in acute distress. Cardiovascular:     Rate and Rhythm: Normal rate and regular rhythm.     Heart sounds: Heart sounds are distant.  Pulmonary:     Effort: Pulmonary effort is normal. No tachypnea.     Breath sounds: No wheezing or rhonchi.  Abdominal:     Palpations: Abdomen is soft.  Musculoskeletal:      Right lower leg: Edema present.     Left lower leg: Edema present.  Neurological:     General: No focal deficit present.     Mental Status: He is alert and oriented to person, place, and time.  Psychiatric:        Mood and Affect: Mood normal.        Behavior: Behavior normal.          Scheduled Medications:   cyanocobalamin  1,000 mcg Oral Daily   feeding supplement  1 Container Oral TID BM   furosemide  40 mg Intravenous Once   labetalol  100 mg Oral BID   pantoprazole (PROTONIX) IV  80 mg Intravenous BID   rosuvastatin  40 mg Oral QHS    Continuous Infusions:  cefTRIAXone (ROCEPHIN)  IV Stopped (10/17/23 0135)    PRN Medications:  acetaminophen **OR** acetaminophen, hydrALAZINE, lactulose, ondansetron **OR** ondansetron (ZOFRAN) IV  Antimicrobials from  admission:  Anti-infectives (From admission, onward)    Start     Dose/Rate Route Frequency Ordered Stop   10/17/23 0023  cefTRIAXone (ROCEPHIN) 2 g in sodium chloride 0.9 % 100 mL IVPB       Note to Pharmacy: S/p MOHS sx of ear today at [facility] Prescribed keflex 500 mg BID for 7 days   2 g 200 mL/hr over 30 Minutes Intravenous Every 24 hours 10/17/23 0023 10/24/23 0114   10/16/23 1630  cefTRIAXone (ROCEPHIN) 2 g in sodium chloride 0.9 % 100 mL IVPB  Status:  Discontinued       Note to Pharmacy: S/p MOHS sx of ear today at [facility]. Prescribed keflex 500 mg BID for 7 days   2 g 200 mL/hr over 30 Minutes Intravenous Every 24 hours 10/16/23 1620 10/17/23 0023           Data Reviewed:  I have personally reviewed the following...  CBC: Recent Labs  Lab 10/16/23 1407 10/17/23 0443  WBC 6.1 6.0  HGB 6.0* 7.2*  HCT 20.7* 23.2*  MCV 108.9* 100.4*  PLT 163 145*   Basic Metabolic Panel: Recent Labs  Lab 10/16/23 1407 10/17/23 0443  NA 136 138  K 4.2 4.5  CL 99 104  CO2 23 25  GLUCOSE 192* 150*  BUN 54* 51*  CREATININE 3.67* 3.64*  CALCIUM 9.6 9.8   GFR: Estimated Creatinine Clearance: 21.2 mL/min (A)  (by C-G formula based on SCr of 3.64 mg/dL (H)). Liver Function Tests: No results for input(s): "AST", "ALT", "ALKPHOS", "BILITOT", "PROT", "ALBUMIN" in the last 168 hours. No results for input(s): "LIPASE", "AMYLASE" in the last 168 hours. No results for input(s): "AMMONIA" in the last 168 hours. Coagulation Profile: No results for input(s): "INR", "PROTIME" in the last 168 hours. Cardiac Enzymes: No results for input(s): "CKTOTAL", "CKMB", "CKMBINDEX", "TROPONINI" in the last 168 hours. BNP (last 3 results) No results for input(s): "PROBNP" in the last 8760 hours. HbA1C: No results for input(s): "HGBA1C" in the last 72 hours. CBG: No results for input(s): "GLUCAP" in the last 168 hours. Lipid Profile: No results for input(s): "CHOL", "HDL", "LDLCALC", "TRIG", "CHOLHDL", "LDLDIRECT" in the last 72 hours. Thyroid Function Tests: No results for input(s): "TSH", "T4TOTAL", "FREET4", "T3FREE", "THYROIDAB" in the last 72 hours. Anemia Panel: No results for input(s): "VITAMINB12", "FOLATE", "FERRITIN", "TIBC", "IRON", "RETICCTPCT" in the last 72 hours. Most Recent Urinalysis On File:     Component Value Date/Time   COLORURINE YELLOW (A) 08/27/2023 1000   APPEARANCEUR CLEAR (A) 08/27/2023 1000   LABSPEC 1.011 08/27/2023 1000   PHURINE 5.0 08/27/2023 1000   GLUCOSEU NEGATIVE 08/27/2023 1000   HGBUR SMALL (A) 08/27/2023 1000   BILIRUBINUR NEGATIVE 08/27/2023 1000   KETONESUR NEGATIVE 08/27/2023 1000   PROTEINUR 100 (A) 08/27/2023 1000   NITRITE NEGATIVE 08/27/2023 1000   LEUKOCYTESUR MODERATE (A) 08/27/2023 1000   Sepsis Labs: @LABRCNTIP (procalcitonin:4,lacticidven:4) Microbiology: No results found for this or any previous visit (from the past 240 hours).    Radiology Studies last 3 days: DG Chest Port 1 View Result Date: 10/17/2023 CLINICAL DATA:  Follow up pleural effusion following right-sided thoracentesis. EXAM: PORTABLE CHEST 1 VIEW COMPARISON:  Radiographs 10/16/2023 and  08/23/2023.  CT 08/20/2023. FINDINGS: 1015 hours. The heart size and mediastinal contours are stable post median sternotomy and CABG. Right pleural effusion has mildly decreased in volume. Small left pleural effusion and bibasilar pulmonary opacities are unchanged. No evidence of pneumothorax. The bones appear unremarkable. IMPRESSION: Mild decrease in right  pleural effusion following thoracentesis. No evidence of pneumothorax. Electronically Signed   By: Carey Bullocks M.D.   On: 10/17/2023 11:30   US THORACENTESIS ASP PLEURAL SPACE W/IMG GUIDE Result Date: 10/17/2023 INDICATION: Shortness of breath. Large right pleural effusion. Request for therapeutic thoracentesis. EXAM: ULTRASOUND GUIDED RIGHT THORACENTESIS MEDICATIONS: 1% plain lidocaine, 5 mL COMPLICATIONS: None immediate. PROCEDURE: An ultrasound guided thoracentesis was thoroughly discussed with the patient and questions answered. The benefits, risks, alternatives and complications were also discussed. The patient understands and wishes to proceed with the procedure. Written consent was obtained. Ultrasound was performed to localize and mark an adequate pocket of fluid in the right chest. The area was then prepped and draped in the normal sterile fashion. 1% Lidocaine was used for local anesthesia. Under ultrasound guidance a 6 Fr Safe-T-Centesis catheter was introduced. Thoracentesis was performed. The catheter was removed and a dressing applied. FINDINGS: A total of approximately 2.2 L of clear, dark yellow fluid was removed. IMPRESSION: Successful ultrasound guided right thoracentesis yielding 2.2 L of pleural fluid. Procedure performed by Brayton El PA-C and supervised by Dr. Irish Lack Electronically Signed   By: Irish Lack M.D.   On: 10/17/2023 11:00   DG Chest 2 View Result Date: 10/16/2023 CLINICAL DATA:  Worsening chest pain and shortness of breath over the past few days, with exertion. Bilateral lower extremity swelling.  Clinically diagnosed CHF. EXAM: CHEST - 2 VIEW COMPARISON:  08/23/2023 FINDINGS: The cardiac silhouette remains mildly enlarged. No significant change in small left pleural effusion and moderate right pleural effusion. Mildly improved bibasilar atelectasis and possible left lower lobe pneumonia. Stable post CABG changes. Tortuous and partially calcified thoracic aorta. Stable sternal fixation hardware. Interval probable zipper overlying the thoracic inlet. IMPRESSION: 1. Mildly improved bibasilar atelectasis and possible left lower lobe pneumonia. 2. No significant change in small left pleural effusion and moderate right pleural effusion. 3. Mild cardiomegaly. 4. Probable zipper overlying the thoracic inlet. Correlation with overlying clothing is recommended to help exclude an ingested foreign body. Electronically Signed   By: Beckie Salts M.D.   On: 10/16/2023 17:01          Sunnie Nielsen, DO [facility] 10/17/2023, 2:09 PM    Dictation software may have been used to generate the above note. Typos may occur and escape review in typed/dictated notes. Please contact Dr Lyn Hollingshead directly for clarity if needed.  Staff may message me via secure chat in Epic  but this may not receive an immediate response,  please page me for urgent matters!  If 7PM-7AM, please contact night coverage [email or web address]

## 2023-10-17 NOTE — Procedures (Signed)
 PROCEDURE SUMMARY:  Successful US guided right thoracentesis. Yielded 2.2 L of clear dark yellow fluid. Pt tolerated procedure well. No immediate complications.  Specimen was not sent for labs. CXR ordered.  EBL < 5 mL  Brayton El PA-C 10/17/2023 10:15 AM

## 2023-10-18 DIAGNOSIS — D649 Anemia, unspecified: Secondary | ICD-10-CM | POA: Diagnosis not present

## 2023-10-18 LAB — BASIC METABOLIC PANEL
Anion gap: 11 (ref 5–15)
BUN: 45 mg/dL — ABNORMAL HIGH (ref 8–23)
CO2: 26 mmol/L (ref 22–32)
Calcium: 9.5 mg/dL (ref 8.9–10.3)
Chloride: 101 mmol/L (ref 98–111)
Creatinine, Ser: 3.53 mg/dL — ABNORMAL HIGH (ref 0.61–1.24)
GFR, Estimated: 17 mL/min — ABNORMAL LOW (ref >60)
Glucose, Bld: 138 mg/dL — ABNORMAL HIGH (ref 70–99)
Potassium: 4.1 mmol/L (ref 3.5–5.1)
Sodium: 138 mmol/L (ref 135–145)

## 2023-10-18 LAB — CBC
HCT: 22.3 % — ABNORMAL LOW (ref 39.0–52.0)
Hemoglobin: 6.9 g/dL — ABNORMAL LOW (ref 13.0–17.0)
MCH: 31.1 pg (ref 26.0–34.0)
MCHC: 30.9 g/dL (ref 30.0–36.0)
MCV: 100.5 fL — ABNORMAL HIGH (ref 80.0–100.0)
Platelets: 128 10*3/uL — ABNORMAL LOW (ref 150–400)
RBC: 2.22 MIL/uL — ABNORMAL LOW (ref 4.22–5.81)
RDW: 16.2 % — ABNORMAL HIGH (ref 11.5–15.5)
WBC: 5.5 10*3/uL (ref 4.0–10.5)
nRBC: 0 % (ref 0.0–0.2)

## 2023-10-18 LAB — PREPARE RBC (CROSSMATCH)

## 2023-10-18 LAB — HEMOGLOBIN AND HEMATOCRIT, BLOOD
HCT: 28.1 % — ABNORMAL LOW (ref 39.0–52.0)
Hemoglobin: 8.8 g/dL — ABNORMAL LOW (ref 13.0–17.0)

## 2023-10-18 MED ORDER — FUROSEMIDE 10 MG/ML IJ SOLN
40.0000 mg | Freq: Once | INTRAMUSCULAR | Status: AC
  Administered 2023-10-18 (×2): 40 mg via INTRAVENOUS
  Filled 2023-10-18: qty 4

## 2023-10-18 MED ORDER — NA SULFATE-K SULFATE-MG SULF 17.5-3.13-1.6 GM/177ML PO SOLN
0.5000 | Freq: Once | ORAL | Status: AC
  Administered 2023-10-18: 177 mL via ORAL
  Filled 2023-10-18: qty 1

## 2023-10-18 MED ORDER — LABETALOL HCL 100 MG PO TABS
50.0000 mg | ORAL_TABLET | Freq: Two times a day (BID) | ORAL | Status: DC
  Administered 2023-10-18 – 2023-10-19 (×2): 50 mg via ORAL
  Filled 2023-10-18 (×2): qty 0.5

## 2023-10-18 MED ORDER — SODIUM CHLORIDE 0.9% IV SOLUTION: Freq: Once | INTRAVENOUS | Status: AC

## 2023-10-18 MED ORDER — NA SULFATE-K SULFATE-MG SULF 17.5-3.13-1.6 GM/177ML PO SOLN
0.5000 | Freq: Once | ORAL | Status: AC
  Administered 2023-10-19: 177 mL via ORAL

## 2023-10-18 NOTE — Progress Notes (Signed)
 Jerry Minium, MD [facility]   [address] [place], [place] [address] Phone: [phone] Fax : [phone]   Subjective: The patient reports that he has not had a bowel movement since being admitted to the hospital.  There is been no further dark stools or any sign of any bleeding.  The patient's hemoglobin had gone down slightly.  The patient also reports that he has not had since 2022 and believes he is due for another colonoscopy.    Objective: Vital signs in last 24 hours: Vitals:   10/17/23 2344 10/18/23 0354 10/18/23 0752 10/18/23 1127  BP: (!) 135/90 134/68 (!) 136/59 109/71  Pulse: 63 75 63 73  Resp: 18 18 17 17   Temp: 97.6 F (36.4 C) 98.2 F (36.8 C) 98.3 F (36.8 C) 98.2 F (36.8 C)  TempSrc:      SpO2: 97% 99% 97% 97%  Weight:      Height:       Weight change:   Intake/Output Summary (Last 24 hours) at 10/18/2023 1134 Last data filed at 10/18/2023 1100 Gross per 24 hour  Intake 200 ml  Output 1250 ml  Net -1050 ml     Exam: Heart:: Regular rate and rhythm or without murmur or extra heart sounds Lungs: normal and clear to auscultation and percussion Abdomen: soft, nontender, normal bowel sounds   Lab Results: @LABTEST2 @ Micro Results: No results found for this or any previous visit (from the past 240 hours). Studies/Results: DG Chest Port 1 View Result Date: 10/17/2023 CLINICAL DATA:  Follow up pleural effusion following right-sided thoracentesis. EXAM: PORTABLE CHEST 1 VIEW COMPARISON:  Radiographs 10/16/2023 and 08/23/2023.  CT 08/20/2023. FINDINGS: 1015 hours. The heart size and mediastinal contours are stable post median sternotomy and CABG. Right pleural effusion has mildly decreased in volume. Small left pleural effusion and bibasilar pulmonary opacities are unchanged. No evidence of pneumothorax. The bones appear unremarkable. IMPRESSION: Mild decrease in right pleural effusion following thoracentesis. No evidence of pneumothorax.  Electronically Signed   By: Carey Bullocks M.D.   On: 10/17/2023 11:30   US THORACENTESIS ASP PLEURAL SPACE W/IMG GUIDE Result Date: 10/17/2023 INDICATION: Shortness of breath. Large right pleural effusion. Request for therapeutic thoracentesis. EXAM: ULTRASOUND GUIDED RIGHT THORACENTESIS MEDICATIONS: 1% plain lidocaine, 5 mL COMPLICATIONS: None immediate. PROCEDURE: An ultrasound guided thoracentesis was thoroughly discussed with the patient and questions answered. The benefits, risks, alternatives and complications were also discussed. The patient understands and wishes to proceed with the procedure. Written consent was obtained. Ultrasound was performed to localize and mark an adequate pocket of fluid in the right chest. The area was then prepped and draped in the normal sterile fashion. 1% Lidocaine was used for local anesthesia. Under ultrasound guidance a 6 Fr Safe-T-Centesis catheter was introduced. Thoracentesis was performed. The catheter was removed and a dressing applied. FINDINGS: A total of approximately 2.2 L of clear, dark yellow fluid was removed. IMPRESSION: Successful ultrasound guided right thoracentesis yielding 2.2 L of pleural fluid. Procedure performed by Brayton El PA-C and supervised by Dr. Irish Lack Electronically Signed   By: Irish Lack M.D.   On: 10/17/2023 11:00   DG Chest 2 View Result Date: 10/16/2023 CLINICAL DATA:  Worsening chest pain and shortness of breath over the past few days, with exertion. Bilateral lower extremity swelling. Clinically diagnosed CHF. EXAM: CHEST - 2 VIEW COMPARISON:  08/23/2023 FINDINGS: The cardiac silhouette remains mildly enlarged. No significant change in small left pleural effusion and moderate right pleural  effusion. Mildly improved bibasilar atelectasis and possible left lower lobe pneumonia. Stable post CABG changes. Tortuous and partially calcified thoracic aorta. Stable sternal fixation hardware. Interval probable zipper overlying  the thoracic inlet. IMPRESSION: 1. Mildly improved bibasilar atelectasis and possible left lower lobe pneumonia. 2. No significant change in small left pleural effusion and moderate right pleural effusion. 3. Mild cardiomegaly. 4. Probable zipper overlying the thoracic inlet. Correlation with overlying clothing is recommended to help exclude an ingested foreign body. Electronically Signed   By: Beckie Salts M.D.   On: 10/16/2023 17:01   Medications: I have reviewed the patient's current medications. Scheduled Meds:  sodium chloride   Intravenous Once   cyanocobalamin  1,000 mcg Oral Daily   feeding supplement  1 Container Oral TID BM   furosemide  40 mg Intravenous Once   labetalol  100 mg Oral BID   Na Sulfate-K Sulfate-Mg Sulfate concentrate  0.5 kit Oral Once   Followed by   Melene Muller ON 10/19/2023] Na Sulfate-K Sulfate-Mg Sulfate concentrate  0.5 kit Oral Once   pantoprazole (PROTONIX) IV  80 mg Intravenous BID   rosuvastatin  40 mg Oral QHS   Continuous Infusions:  cefTRIAXone (ROCEPHIN)  IV 2 g (10/18/23 0118)   PRN Meds:.acetaminophen **OR** acetaminophen, hydrALAZINE, lactulose, ondansetron **OR** ondansetron (ZOFRAN) IV   Assessment: Principal Problem:   Severe anemia Active Problems:   Essential hypertension   OSA (obstructive sleep apnea)   S/P CABG x 3   CAD (coronary artery disease), native coronary artery   Other hyperlipidemia   S/P drug eluting coronary stent placement   Acute kidney injury superimposed on chronic kidney disease (HCC)   Severe pulmonary hypertension (HCC)   Status post Mohs surgery   Upper GI bleed   Recurrent right pleural effusion   Symptomatic anemia   Acute exacerbation of CHF (congestive heart failure) (HCC)   Dyspnea on exertion    Plan:  Due to his profound anemia we will set him up for an EGD and colonoscopy for tomorrow.  The patient has been explained the plan and agrees with it.  He has been written orders for a prep to be taken today  and n.p.o. starting at 5:00 in the morning and a clear liquid diet today.   LOS: 2 days   Jerry Minium, MD.FACG 10/18/2023, 11:34 AM Pager [phone] 7am-5pm  Check AMION for 5pm -7am coverage and on weekends

## 2023-10-18 NOTE — Progress Notes (Signed)
 PROGRESS NOTE    [name]   ZOX:[id number] DOB: 01-Feb-1946  DOA: 10/16/2023 Date of Service: 10/18/23 which is hospital day 2  PCP: Jerry Shaggy, MD    Hospital course / significant events:   HPI: [name] is a 78 year old male with history of paroxysmal atrial fibrillation on Eliquis, heart failure preserved ejection fraction, hypertension, obstructive sleep apnea, hyperlipidemia, CAD status post CABG and DES, history of liver cirrhosis, symptomatic anemia, pulmonary hypertension, who presents emergency department for chief concerns of shortness of breath and chest pain. He reports he has had ongoing shortness of breath for the last few months and it has been worsening over the last few days.  He reports that shortness of breath is worse with exertion. He has been having black stool since January and this has not resolved. Of note, had EGD at that time 01/13 - normal esophagus, multiple pedunculated and sessile polyps without bleeding found in entire examined stomach, normal duodenum    03/05: to ED, Hgb 6.0, 1 unit PRBC ordered and GI consulted - likely for EGD after Palvix washout. Admitted to hospitalist.  03/06: no improvement renal function, Hgb 7.2, thoracentesis --> 2.2 L. Pt reports worsening edema, will trial lasix x1, pt aware may need to hold this if renal function worsening. GI eval - anticipate EGD this weekend.  03/07: Hgb down 6.9, 1 unit PRBC      Consultants:  Gastroenterology   Procedures/Surgeries: 10/17/23 thoracentesis       ASSESSMENT & PLAN:   Severe anemia ABLA d/t GI bleed S/p 2 unit PRBC 03/05, 1 unit PRBC 03/07 Protonix 80 mg IV bid Hold Plavix and Eliquis. Avoid NSAIDs.  EGD and colonoscopy tomorrow 03/08 per GI  Clear liquid diet pend GI recs  GI following Follow CBC, transfuse to keep Hgb at least 7+  Recurrent/chronic pleural effusion Had thoracentsis 08/2023 also S/p thoracentesis 10/17/23 removed 2/2 L Strict Is and Os   diuresis w/ close monitoring renal function   CAD (coronary artery disease), native coronary artery  Hx CABG 2018 S/P drug eluting coronary stent placement Patient last took Plavix 10/16/23 a.m. and Eliquis 10/15/23 p.m. 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 twice daily to complete a 7-day course Ceftriaxone 2 g IV daily, 7 days course ordered on admission Wound care     Hyperlipidemia Home rosuvastatin 40 mg nightly resumed   OSA (obstructive sleep apnea) CPAP nightly declined by patient, states he does not use this at home    Essential hypertension Home amlodipine 5 mg p.o. twice daily, labetalol 100 mg p.o. twice daily, hydralazine 50 mg 3 times daily, torsemide 40 mg daily not resumed on admission as patient is low normotensive on admission Resume beta blocker lower dose  Hydralazine 5 mg IV every 6 hours as needed for SBP greater 165, 5 days ordered      overweight based on BMI: Body mass index is 28.37 kg/m.  Underweight -  under 18  overweight - 25 to 29 obese - 30 or more Class 1 obesity: BMI of 30.0 to 34 Class 2 obesity: BMI of 35.0 to 39 Class 3 obesity: BMI of 40.0 to 49 Super Morbid Obesity: BMI 50-59 Super-super Morbid Obesity: BMI 60+ Significantly low or high BMI is associated with higher medical risk.  Weight management advised as adjunct to other  disease management and risk reduction treatments    DVT prophylaxis: no Rx ppx d/t bleed risk  IV fluids: no continuous IV fluids  Nutrition: CLD pending GI recs  Central lines / invasive devices: none  Code Status: FULL CODE ACP documentation reviewed:  none on file in [facility]  TOC needs: none at this time, anticipate will need home health Barriers to dispo / significant pending items: GI bleed, monitoring anemia / renal fxn, GI consult pending, expect emdical stability in few more days / pending EGD              Subjective / Brief ROS:  Patient reports edema in lower extremities is worse today, some skin cracking  Denies CP/SOB - SOB improved following thoracentesis  Pain controlled.  Denies new weakness.  Tolerating diet, asking about solid foods .  Reports no concerns w/ urination/defecation.   Family Communication: none at this time     Objective Findings:  Vitals:   10/18/23 0354 10/18/23 0752 10/18/23 1127 10/18/23 1202  BP: 134/68 (!) 136/59 109/71 (!) 110/56  Pulse: 75 63 73 (!) 57  Resp: 18 17 17 17   Temp: 98.2 F (36.8 C) 98.3 F (36.8 C) 98.2 F (36.8 C) 97.8 F (36.6 C)  TempSrc:      SpO2: 99% 97% 97% 99%  Weight:      Height:        Intake/Output Summary (Last 24 hours) at 10/18/2023 1353 Last data filed at 10/18/2023 1100 Gross per 24 hour  Intake 200 ml  Output 1250 ml  Net -1050 ml   Filed Weights   10/16/23 1405  Weight: 97.5 kg    Examination:  Physical Exam Constitutional:      General: He is not in acute distress. Cardiovascular:     Rate and Rhythm: Normal rate and regular rhythm.     Heart sounds: Heart sounds are distant.  Pulmonary:     Effort: Pulmonary effort is normal. No tachypnea.     Breath sounds: No wheezing or rhonchi.  Abdominal:     Palpations: Abdomen is soft.  Musculoskeletal:     Right lower leg: Edema present.     Left lower leg: Edema present.  Neurological:     General: No focal deficit present.      Mental Status: He is alert and oriented to person, place, and time.  Psychiatric:        Mood and Affect: Mood normal.        Behavior: Behavior normal.          Scheduled Medications:   cyanocobalamin  1,000 mcg Oral Daily   feeding supplement  1 Container Oral TID BM   labetalol  50 mg Oral BID   Na Sulfate-K Sulfate-Mg Sulfate concentrate  0.5 kit Oral Once   Followed by   Melene Muller ON 10/19/2023] Na Sulfate-K Sulfate-Mg Sulfate concentrate  0.5 kit Oral Once   pantoprazole (PROTONIX) IV  80 mg Intravenous BID   rosuvastatin  40 mg Oral QHS    Continuous Infusions:  cefTRIAXone (ROCEPHIN)  IV 2 g (10/18/23 0118)  PRN Medications:  acetaminophen **OR** acetaminophen, hydrALAZINE, lactulose, ondansetron **OR** ondansetron (ZOFRAN) IV  Antimicrobials from admission:  Anti-infectives (From admission, onward)    Start     Dose/Rate Route Frequency Ordered Stop   10/17/23 0023  cefTRIAXone (ROCEPHIN) 2 g in sodium chloride 0.9 % 100 mL IVPB       Note to Pharmacy: S/p MOHS sx of ear today at [facility]. Prescribed keflex 500 mg BID for 7 days   2 g 200 mL/hr over 30 Minutes Intravenous Every 24 hours 10/17/23 0023 10/24/23 0114   10/16/23 1630  cefTRIAXone (ROCEPHIN) 2 g in sodium chloride 0.9 % 100 mL IVPB  Status:  Discontinued       Note to Pharmacy: S/p MOHS sx of ear today at [facility]. Prescribed keflex 500 mg BID for 7 days   2 g 200 mL/hr over 30 Minutes Intravenous Every 24 hours 10/16/23 1620 10/17/23 0023           Data Reviewed:  I have personally reviewed the following...  CBC: Recent Labs  Lab 10/16/23 1407 10/17/23 0443 10/18/23 0457  WBC 6.1 6.0 5.5  HGB 6.0* 7.2* 6.9*  HCT 20.7* 23.2* 22.3*  MCV 108.9* 100.4* 100.5*  PLT 163 145* 128*   Basic Metabolic Panel: Recent Labs  Lab 10/16/23 1407 10/17/23 0443 10/18/23 0457  NA 136 138 138  K 4.2 4.5 4.1  CL 99 104 101  CO2 23 25 26   GLUCOSE 192* 150* 138*  BUN 54* 51* 45*  CREATININE 3.67*  3.64* 3.53*  CALCIUM 9.6 9.8 9.5   GFR: Estimated Creatinine Clearance: 21.9 mL/min (A) (by C-G formula based on SCr of 3.53 mg/dL (H)). Liver Function Tests: No results for input(s): "AST", "ALT", "ALKPHOS", "BILITOT", "PROT", "ALBUMIN" in the last 168 hours. No results for input(s): "LIPASE", "AMYLASE" in the last 168 hours. No results for input(s): "AMMONIA" in the last 168 hours. Coagulation Profile: No results for input(s): "INR", "PROTIME" in the last 168 hours. Cardiac Enzymes: No results for input(s): "CKTOTAL", "CKMB", "CKMBINDEX", "TROPONINI" in the last 168 hours. BNP (last 3 results) No results for input(s): "PROBNP" in the last 8760 hours. HbA1C: No results for input(s): "HGBA1C" in the last 72 hours. CBG: No results for input(s): "GLUCAP" in the last 168 hours. Lipid Profile: No results for input(s): "CHOL", "HDL", "LDLCALC", "TRIG", "CHOLHDL", "LDLDIRECT" in the last 72 hours. Thyroid Function Tests: No results for input(s): "TSH", "T4TOTAL", "FREET4", "T3FREE", "THYROIDAB" in the last 72 hours. Anemia Panel: No results for input(s): "VITAMINB12", "FOLATE", "FERRITIN", "TIBC", "IRON", "RETICCTPCT" in the last 72 hours. Most Recent Urinalysis On File:     Component Value Date/Time   COLORURINE YELLOW (A) 08/27/2023 1000   APPEARANCEUR CLEAR (A) 08/27/2023 1000   LABSPEC 1.011 08/27/2023 1000   PHURINE 5.0 08/27/2023 1000   GLUCOSEU NEGATIVE 08/27/2023 1000   HGBUR SMALL (A) 08/27/2023 1000   BILIRUBINUR NEGATIVE 08/27/2023 1000   KETONESUR NEGATIVE 08/27/2023 1000   PROTEINUR 100 (A) 08/27/2023 1000   NITRITE NEGATIVE 08/27/2023 1000   LEUKOCYTESUR MODERATE (A) 08/27/2023 1000   Sepsis Labs: @LABRCNTIP (procalcitonin:4,lacticidven:4) Microbiology: No results found for this or any previous visit (from the past 240 hours).    Radiology Studies last 3 days: DG Chest Port 1 View Result Date: 10/17/2023 CLINICAL DATA:  Follow up pleural effusion following  right-sided thoracentesis. EXAM: PORTABLE CHEST 1 VIEW COMPARISON:  Radiographs 10/16/2023 and 08/23/2023.  CT 08/20/2023. FINDINGS: 1015 hours. The heart size and mediastinal contours are stable post median sternotomy  and CABG. Right pleural effusion has mildly decreased in volume. Small left pleural effusion and bibasilar pulmonary opacities are unchanged. No evidence of pneumothorax. The bones appear unremarkable. IMPRESSION: Mild decrease in right pleural effusion following thoracentesis. No evidence of pneumothorax. Electronically Signed   By: Carey Bullocks M.D.   On: 10/17/2023 11:30   US THORACENTESIS ASP PLEURAL SPACE W/IMG GUIDE Result Date: 10/17/2023 INDICATION: Shortness of breath. Large right pleural effusion. Request for therapeutic thoracentesis. EXAM: ULTRASOUND GUIDED RIGHT THORACENTESIS MEDICATIONS: 1% plain lidocaine, 5 mL COMPLICATIONS: None immediate. PROCEDURE: An ultrasound guided thoracentesis was thoroughly discussed with the patient and questions answered. The benefits, risks, alternatives and complications were also discussed. The patient understands and wishes to proceed with the procedure. Written consent was obtained. Ultrasound was performed to localize and mark an adequate pocket of fluid in the right chest. The area was then prepped and draped in the normal sterile fashion. 1% Lidocaine was used for local anesthesia. Under ultrasound guidance a 6 Fr Safe-T-Centesis catheter was introduced. Thoracentesis was performed. The catheter was removed and a dressing applied. FINDINGS: A total of approximately 2.2 L of clear, dark yellow fluid was removed. IMPRESSION: Successful ultrasound guided right thoracentesis yielding 2.2 L of pleural fluid. Procedure performed by Brayton El PA-C and supervised by Dr. Irish Lack Electronically Signed   By: Irish Lack M.D.   On: 10/17/2023 11:00   DG Chest 2 View Result Date: 10/16/2023 CLINICAL DATA:  Worsening chest pain and shortness  of breath over the past few days, with exertion. Bilateral lower extremity swelling. Clinically diagnosed CHF. EXAM: CHEST - 2 VIEW COMPARISON:  08/23/2023 FINDINGS: The cardiac silhouette remains mildly enlarged. No significant change in small left pleural effusion and moderate right pleural effusion. Mildly improved bibasilar atelectasis and possible left lower lobe pneumonia. Stable post CABG changes. Tortuous and partially calcified thoracic aorta. Stable sternal fixation hardware. Interval probable zipper overlying the thoracic inlet. IMPRESSION: 1. Mildly improved bibasilar atelectasis and possible left lower lobe pneumonia. 2. No significant change in small left pleural effusion and moderate right pleural effusion. 3. Mild cardiomegaly. 4. Probable zipper overlying the thoracic inlet. Correlation with overlying clothing is recommended to help exclude an ingested foreign body. Electronically Signed   By: Beckie Salts M.D.   On: 10/16/2023 17:01          Sunnie Nielsen, DO [facility] 10/18/2023, 1:53 PM    Dictation software may have been used to generate the above note. Typos may occur and escape review in typed/dictated notes. Please contact Dr Lyn Hollingshead directly for clarity if needed.  Staff may message me via secure chat in Epic  but this may not receive an immediate response,  please page me for urgent matters!  If 7PM-7AM, please contact night coverage [email or web address]

## 2023-10-18 NOTE — Plan of Care (Signed)
  Problem: Education: Goal: Knowledge of General Education information will improve Description: Including pain rating scale, medication(s)/side effects and non-pharmacologic comfort measures Outcome: Progressing   Problem: Health Behavior/Discharge Planning: Goal: Ability to manage health-related needs will improve Outcome: Progressing   Problem: Clinical Measurements: Goal: Ability to maintain clinical measurements within normal limits will improve Outcome: Progressing Goal: Will remain free from infection Outcome: Progressing Goal: Diagnostic test results will improve Outcome: Progressing   Problem: Elimination: Goal: Will not experience complications related to bowel motility Outcome: Progressing   Problem: Safety: Goal: Ability to remain free from injury will improve Outcome: Progressing

## 2023-10-18 NOTE — Progress Notes (Signed)
 Patient refused for dressing change.

## 2023-10-18 NOTE — Plan of Care (Signed)

## 2023-10-18 NOTE — Progress Notes (Signed)
   10/18/23 1030  Spiritual Encounters  Type of Visit Initial  Care provided to: Patient  Referral source Chaplain assessment  Reason for visit Routine spiritual support  OnCall Visit No  Spiritual Framework  Presenting Themes Goals in life/care;Meaning/purpose/sources of inspiration  Interventions  Spiritual Care Interventions Made Established relationship of care and support;Compassionate presence;Reflective listening;Encouragement  Intervention Outcomes  Outcomes Connection to spiritual care;Awareness around self/spiritual resourses;Connection to values and goals of care;Awareness of support

## 2023-10-19 ENCOUNTER — Inpatient Hospital Stay: Admitting: Anesthesiology

## 2023-10-19 ENCOUNTER — Encounter: Payer: Self-pay | Admitting: Internal Medicine

## 2023-10-19 ENCOUNTER — Encounter
Admission: EM | Disposition: A | Discharge status: Home / Self Care | Payer: Self-pay | Source: Home / Self Care | Attending: Osteopathic Medicine

## 2023-10-19 DIAGNOSIS — K642 Third degree hemorrhoids: Secondary | ICD-10-CM

## 2023-10-19 DIAGNOSIS — D123 Benign neoplasm of transverse colon: Secondary | ICD-10-CM

## 2023-10-19 DIAGNOSIS — K317 Polyp of stomach and duodenum: Secondary | ICD-10-CM

## 2023-10-19 DIAGNOSIS — K635 Polyp of colon: Secondary | ICD-10-CM

## 2023-10-19 DIAGNOSIS — D649 Anemia, unspecified: Secondary | ICD-10-CM | POA: Diagnosis not present

## 2023-10-19 HISTORY — PX: COLONOSCOPY: SHX5424

## 2023-10-19 HISTORY — PX: POLYPECTOMY: SHX5525

## 2023-10-19 HISTORY — PX: ESOPHAGOGASTRODUODENOSCOPY: SHX5428

## 2023-10-19 LAB — BPAM RBC
Blood Product Expiration Date: 202504062359
Blood Product Expiration Date: 202504062359
Blood Product Expiration Date: 202504062359
ISSUE DATE / TIME: 202503051614
ISSUE DATE / TIME: 202503052134
ISSUE DATE / TIME: 202503071151
Unit Type and Rh: 6200
Unit Type and Rh: 6200
Unit Type and Rh: 6200

## 2023-10-19 LAB — BASIC METABOLIC PANEL
Anion gap: 10 (ref 5–15)
BUN: 39 mg/dL — ABNORMAL HIGH (ref 8–23)
CO2: 25 mmol/L (ref 22–32)
Calcium: 9.3 mg/dL (ref 8.9–10.3)
Chloride: 104 mmol/L (ref 98–111)
Creatinine, Ser: 3.51 mg/dL — ABNORMAL HIGH (ref 0.61–1.24)
GFR, Estimated: 17 mL/min — ABNORMAL LOW (ref >60)
Glucose, Bld: 132 mg/dL — ABNORMAL HIGH (ref 70–99)
Potassium: 4.2 mmol/L (ref 3.5–5.1)
Sodium: 139 mmol/L (ref 135–145)

## 2023-10-19 LAB — CBC
HCT: 25.7 % — ABNORMAL LOW (ref 39.0–52.0)
Hemoglobin: 8 g/dL — ABNORMAL LOW (ref 13.0–17.0)
MCH: 31 pg (ref 26.0–34.0)
MCHC: 31.1 g/dL (ref 30.0–36.0)
MCV: 99.6 fL (ref 80.0–100.0)
Platelets: 112 10*3/uL — ABNORMAL LOW (ref 150–400)
RBC: 2.58 MIL/uL — ABNORMAL LOW (ref 4.22–5.81)
RDW: 16.9 % — ABNORMAL HIGH (ref 11.5–15.5)
WBC: 4.9 10*3/uL (ref 4.0–10.5)
nRBC: 0 % (ref 0.0–0.2)

## 2023-10-19 LAB — TYPE AND SCREEN
ABO/RH(D): A POS
Antibody Screen: NEGATIVE
Unit division: 0
Unit division: 0
Unit division: 0

## 2023-10-19 MED ORDER — PHENYLEPHRINE HCL (PRESSORS) 10 MG/ML IV SOLN
INTRAVENOUS | Status: DC | PRN
  Administered 2023-10-19: 80 ug via INTRAVENOUS

## 2023-10-19 MED ORDER — PROPOFOL 500 MG/50ML IV EMUL
INTRAVENOUS | Status: DC | PRN
  Administered 2023-10-19: 150 ug/kg/min via INTRAVENOUS

## 2023-10-19 MED ORDER — LABETALOL HCL 100 MG PO TABS: 50.0000 mg | ORAL_TABLET | Freq: Two times a day (BID) | ORAL | Status: DC

## 2023-10-19 MED ORDER — PANTOPRAZOLE SODIUM 40 MG IV SOLR
40.0000 mg | Freq: Two times a day (BID) | INTRAVENOUS | Status: DC
  Administered 2023-10-19: 40 mg via INTRAVENOUS
  Filled 2023-10-19: qty 10

## 2023-10-19 MED ORDER — PHENYLEPHRINE 80 MCG/ML (10ML) SYRINGE FOR IV PUSH (FOR BLOOD PRESSURE SUPPORT)
PREFILLED_SYRINGE | INTRAVENOUS | Status: AC
  Filled 2023-10-19: qty 10

## 2023-10-19 MED ORDER — LIDOCAINE 2% (20 MG/ML) 5 ML SYRINGE
INTRAMUSCULAR | Status: DC | PRN
  Administered 2023-10-19: 50 mg via INTRAVENOUS

## 2023-10-19 MED ORDER — SODIUM CHLORIDE 0.9 % IV SOLN
INTRAVENOUS | Status: DC | PRN
Start: 2023-10-19 — End: 2023-10-19

## 2023-10-19 MED ORDER — LIDOCAINE HCL (PF) 2 % IJ SOLN
INTRAMUSCULAR | Status: AC
  Filled 2023-10-19: qty 5

## 2023-10-19 MED ORDER — PROPOFOL 10 MG/ML IV BOLUS
INTRAVENOUS | Status: DC | PRN
  Administered 2023-10-19: 70 mg via INTRAVENOUS

## 2023-10-19 MED ORDER — PROPOFOL 10 MG/ML IV BOLUS
INTRAVENOUS | Status: AC
Start: 2023-10-19 — End: ?
  Filled 2023-10-19: qty 40

## 2023-10-19 MED ORDER — ACETAMINOPHEN 325 MG PO TABS: 650.0000 mg | ORAL_TABLET | Freq: Four times a day (QID) | ORAL | Status: DC | PRN

## 2023-10-19 NOTE — Discharge Summary (Signed)
 Physician Discharge Summary   Patient: [name] MRN: [id number]  DOB: [birth year]   Admit:     Date of Admission: 10/16/2023 Admitted from: home   Discharge: Date of discharge: 10/19/23 Disposition: Home Condition at discharge: good  CODE STATUS: FULL CODE     Discharge Physician: Sunnie Nielsen, DO [facility]     PCP: Jaclyn Shaggy, MD  Recommendations for Outpatient Follow-up:  Follow up with PCP Jaclyn Shaggy, MD in 1-2 weeks Please obtain labs/tests: CBC in 1 week Please follow up on the following pending results: biopsy results from colonoscopy - follow as directed w/ GI    Discharge Instructions     Diet - low sodium heart healthy   Complete by: As directed    Discharge wound care:   Complete by: As directed    Per dermatology recommendations to ear   Increase activity slowly   Complete by: As directed          Discharge Diagnoses: Principal Problem:   Severe anemia Active Problems:   CAD (coronary artery disease), native coronary artery   Upper GI bleed   Acute kidney injury superimposed on chronic kidney disease (HCC)   Recurrent right pleural effusion   Essential hypertension   OSA (obstructive sleep apnea)   S/P CABG x 3   Other hyperlipidemia   S/P drug eluting coronary stent placement   Severe pulmonary hypertension (HCC)   Status post Mohs surgery   Symptomatic anemia   Acute exacerbation of CHF (congestive heart failure) (HCC)   Dyspnea on exertion   Polyp of transverse colon   Gastric polyps       Hospital course / significant events:   HPI: [name] is a 78 year old male with history of paroxysmal atrial fibrillation on Eliquis, heart failure preserved ejection fraction, hypertension, obstructive sleep apnea, hyperlipidemia, CAD status post CABG and DES, history of liver cirrhosis, symptomatic anemia, pulmonary hypertension, who presents emergency department for chief concerns of shortness of breath  and chest pain. He reports he has had ongoing shortness of breath for the last few months and it has been worsening over the last few days.  He reports that shortness of breath is worse with exertion. He has been having black stool since January and this has not resolved. Of note, had EGD at that time 01/13 - normal esophagus, multiple pedunculated and sessile polyps without bleeding found in entire examined stomach, normal duodenum    03/05: to ED, Hgb 6.0, 1 unit PRBC ordered and GI consulted - likely for EGD after Palvix washout. Admitted to hospitalist.  03/06: no improvement renal function, Hgb 7.2, thoracentesis --> 2.2 L. Pt reports worsening edema, will trial lasix x1, pt aware may need to hold this if renal function worsening. GI eval - anticipate EGD this weekend.  03/07: Hgb down 6.9, 1 unit PRBC  03/08: Hgb 8.0. EGD --> normal. Colonoscopy --> polyp removal x2, patent colo-colonic anastomosis, non-bleeding internal hemorrhoids. OK to discharge home      Consultants:  Gastroenterology   Procedures/Surgeries: 10/17/23 thoracentesis       ASSESSMENT & PLAN:   Severe anemia ABLA d/t GI bleed S/p 2 unit PRBC 03/05, 1 unit PRBC 03/07 Protonix 80 mg IV bid --> home PPI Hold Plavix and Eliquis. Avoid NSAIDs.  GI following for biopsy results Follow CBC outpatient   Recurrent/chronic pleural effusion Had thoracentsis 08/2023 also S/p thoracentesis 10/17/23 removed 2/2 L Strict Is and Os  diuresis to  resume outpatient  Follow BMP outpatient Consider periodic CXR   CAD (coronary artery disease), native coronary artery  Hx CABG 2018 Paroxysmal Afib S/P drug eluting coronary stent placement 2021 Patient last took Plavix 10/16/23 a.m. and Eliquis 10/15/23 p.m. Rosuvastatin 40 mg nightly  Plavix, Eliquis held d/t recurrent GIB   Acute kidney injury superimposed on chronic kidney disease IV Avoid nephrotoxic medications as able Po hydration caution w/ edema/cardiac hx Monitor  BMP on restarted diuretics   Dyspnea on exertion Ddx heart failure exacerbation, pleural effusion, anemia Improved post thoracentesis  Treat/workup as above    Status post Mohs surgery Status post Mohs surgery of the left ear 10/16/2023 at [facility] Patient was prescribed cephalexin 500 mg p.o. twice daily to complete a 7-day course Ceftriaxone 2 g IV daily, 7 days course ordered on admission Wound care     Hyperlipidemia Home rosuvastatin 40 mg nightly resumed   OSA (obstructive sleep apnea) CPAP nightly declined by patient, states he does not use this at home    Essential hypertension Home amlodipine 5 mg p.o. twice daily, labetalol 100 mg p.o. twice daily, hydralazine 50 mg 3 times daily, torsemide 40 mg daily not resumed on admission as patient is low normotensive on admission Resume beta blocker lower dose for rate control Resume diuretic          Discharge Instructions  Allergies as of 10/19/2023       Reactions   Isosorbide Cough        Medication List     PAUSE taking these medications    amLODipine 5 MG tablet Wait to take this until your doctor or other care provider tells you to start again. Commonly known as: NORVASC Take 5 mg by mouth 2 (two) times daily.   hydrALAZINE 50 MG tablet Wait to take this until your doctor or other care provider tells you to start again. Commonly known as: APRESOLINE Take 1 tablet by mouth 3 (three) times daily.       STOP taking these medications    apixaban 5 MG Tabs tablet Commonly known as: ELIQUIS   clopidogrel 75 MG tablet Commonly known as: PLAVIX       TAKE these medications    acetaminophen 325 MG tablet Commonly known as: TYLENOL Take 2 tablets (650 mg total) by mouth every 6 (six) hours as needed for mild pain (pain score 1-3), fever or headache (or Fever >/= 101).   cyanocobalamin 1000 MCG tablet Commonly known as: VITAMIN B12 Take 1,000 mcg by mouth daily.   dexlansoprazole 60 MG  capsule Commonly known as: DEXILANT Take 1 capsule by mouth daily.   Iron 325 (65 Fe) MG Tabs Take 1 tablet by mouth daily.   Jardiance 10 MG Tabs tablet Generic drug: empagliflozin Take 1 tablet (10 mg total) by mouth daily before breakfast.   labetalol 100 MG tablet Commonly known as: NORMODYNE Take 0.5 tablets (50 mg total) by mouth 2 (two) times daily. What changed: how much to take   lactulose 10 GM/15ML solution Commonly known as: CHRONULAC Taking 15-20 cc by mouth 3 to 4 times a day for constiption   nitroGLYCERIN 0.4 MG SL tablet Commonly known as: NITROSTAT Place 0.4 mg under the tongue every 5 (five) minutes as needed.   rosuvastatin 40 MG tablet Commonly known as: CRESTOR Take 40 mg by mouth daily.   sitaGLIPtin 100 MG tablet Commonly known as: JANUVIA Take 100 mg by mouth daily.   Testosterone 1.62 % Gel Apply 2  Pump topically daily. One pump on each arm   torsemide 20 MG tablet Commonly known as: DEMADEX Take 2 tablets (40 mg total) by mouth daily.               Discharge Care Instructions  (From admission, onward)           Start     Ordered   10/19/23 0000  Discharge wound care:       Comments: Per dermatology recommendations to ear   10/19/23 1351              Allergies  Allergen Reactions   Isosorbide Cough     Subjective: pt feeling well, no concerns, denied CP/SOB   Discharge Exam: BP 131/63   Pulse 65   Temp 97.8 F (36.6 C)   Resp 19   Ht 6\' 1"  (1.854 m)   Wt 97.5 kg   SpO2 96%   BMI 28.37 kg/m  General: Pt is alert, awake, not in acute distress Cardiovascular: RRR, S1/S2 +, no rubs, no gallops Respiratory: CTA bilaterally, no wheezing, no rhonchi Abdominal: Soft, NT, ND, bowel sounds + Extremities: +stable LE edema, no cyanosis     The results of significant diagnostics from this hospitalization (including imaging, microbiology, ancillary and laboratory) are listed below for reference.      Microbiology: No results found for this or any previous visit (from the past 240 hours).   Labs: BNP (last 3 results) Recent Labs    12/20/22 1258 08/22/23 0730 10/16/23 1415  BNP 1,180.2* 1,069.2* 942.6*   Basic Metabolic Panel: Recent Labs  Lab 10/16/23 1407 10/17/23 0443 10/18/23 0457 10/19/23 0518  NA 136 138 138 139  K 4.2 4.5 4.1 4.2  CL 99 104 101 104  CO2 23 25 26 25   GLUCOSE 192* 150* 138* 132*  BUN 54* 51* 45* 39*  CREATININE 3.67* 3.64* 3.53* 3.51*  CALCIUM 9.6 9.8 9.5 9.3   Liver Function Tests: No results for input(s): "AST", "ALT", "ALKPHOS", "BILITOT", "PROT", "ALBUMIN" in the last 168 hours. No results for input(s): "LIPASE", "AMYLASE" in the last 168 hours. No results for input(s): "AMMONIA" in the last 168 hours. CBC: Recent Labs  Lab 10/16/23 1407 10/17/23 0443 10/18/23 0457 10/18/23 1843 10/19/23 0518  WBC 6.1 6.0 5.5  --  4.9  HGB 6.0* 7.2* 6.9* 8.8* 8.0*  HCT 20.7* 23.2* 22.3* 28.1* 25.7*  MCV 108.9* 100.4* 100.5*  --  99.6  PLT 163 145* 128*  --  112*   Cardiac Enzymes: No results for input(s): "CKTOTAL", "CKMB", "CKMBINDEX", "TROPONINI" in the last 168 hours. BNP: Invalid input(s): "POCBNP" CBG: No results for input(s): "GLUCAP" in the last 168 hours. D-Dimer No results for input(s): "DDIMER" in the last 72 hours. Hgb A1c No results for input(s): "HGBA1C" in the last 72 hours. Lipid Profile No results for input(s): "CHOL", "HDL", "LDLCALC", "TRIG", "CHOLHDL", "LDLDIRECT" in the last 72 hours. Thyroid function studies No results for input(s): "TSH", "T4TOTAL", "T3FREE", "THYROIDAB" in the last 72 hours.  Invalid input(s): "FREET3" Anemia work up No results for input(s): "VITAMINB12", "FOLATE", "FERRITIN", "TIBC", "IRON", "RETICCTPCT" in the last 72 hours. Urinalysis    Component Value Date/Time   COLORURINE YELLOW (A) 08/27/2023 1000   APPEARANCEUR CLEAR (A) 08/27/2023 1000   LABSPEC 1.011 08/27/2023 1000   PHURINE 5.0  08/27/2023 1000   GLUCOSEU NEGATIVE 08/27/2023 1000   HGBUR SMALL (A) 08/27/2023 1000   BILIRUBINUR NEGATIVE 08/27/2023 1000   KETONESUR NEGATIVE 08/27/2023 1000  PROTEINUR 100 (A) 08/27/2023 1000   NITRITE NEGATIVE 08/27/2023 1000   LEUKOCYTESUR MODERATE (A) 08/27/2023 1000   Sepsis Labs Recent Labs  Lab 10/16/23 1407 10/17/23 0443 10/18/23 0457 10/19/23 0518  WBC 6.1 6.0 5.5 4.9   Microbiology No results found for this or any previous visit (from the past 240 hours). Imaging DG Chest Port 1 View Result Date: 10/17/2023 CLINICAL DATA:  Follow up pleural effusion following right-sided thoracentesis. EXAM: PORTABLE CHEST 1 VIEW COMPARISON:  Radiographs 10/16/2023 and 08/23/2023.  CT 08/20/2023. FINDINGS: 1015 hours. The heart size and mediastinal contours are stable post median sternotomy and CABG. Right pleural effusion has mildly decreased in volume. Small left pleural effusion and bibasilar pulmonary opacities are unchanged. No evidence of pneumothorax. The bones appear unremarkable. IMPRESSION: Mild decrease in right pleural effusion following thoracentesis. No evidence of pneumothorax. Electronically Signed   By: Carey Bullocks M.D.   On: 10/17/2023 11:30   US THORACENTESIS ASP PLEURAL SPACE W/IMG GUIDE Result Date: 10/17/2023 INDICATION: Shortness of breath. Large right pleural effusion. Request for therapeutic thoracentesis. EXAM: ULTRASOUND GUIDED RIGHT THORACENTESIS MEDICATIONS: 1% plain lidocaine, 5 mL COMPLICATIONS: None immediate. PROCEDURE: An ultrasound guided thoracentesis was thoroughly discussed with the patient and questions answered. The benefits, risks, alternatives and complications were also discussed. The patient understands and wishes to proceed with the procedure. Written consent was obtained. Ultrasound was performed to localize and mark an adequate pocket of fluid in the right chest. The area was then prepped and draped in the normal sterile fashion. 1% Lidocaine  was used for local anesthesia. Under ultrasound guidance a 6 Fr Safe-T-Centesis catheter was introduced. Thoracentesis was performed. The catheter was removed and a dressing applied. FINDINGS: A total of approximately 2.2 L of clear, dark yellow fluid was removed. IMPRESSION: Successful ultrasound guided right thoracentesis yielding 2.2 L of pleural fluid. Procedure performed by Brayton El PA-C and supervised by Dr. Irish Lack Electronically Signed   By: Irish Lack M.D.   On: 10/17/2023 11:00   DG Chest 2 View Result Date: 10/16/2023 CLINICAL DATA:  Worsening chest pain and shortness of breath over the past few days, with exertion. Bilateral lower extremity swelling. Clinically diagnosed CHF. EXAM: CHEST - 2 VIEW COMPARISON:  08/23/2023 FINDINGS: The cardiac silhouette remains mildly enlarged. No significant change in small left pleural effusion and moderate right pleural effusion. Mildly improved bibasilar atelectasis and possible left lower lobe pneumonia. Stable post CABG changes. Tortuous and partially calcified thoracic aorta. Stable sternal fixation hardware. Interval probable zipper overlying the thoracic inlet. IMPRESSION: 1. Mildly improved bibasilar atelectasis and possible left lower lobe pneumonia. 2. No significant change in small left pleural effusion and moderate right pleural effusion. 3. Mild cardiomegaly. 4. Probable zipper overlying the thoracic inlet. Correlation with overlying clothing is recommended to help exclude an ingested foreign body. Electronically Signed   By: Beckie Salts M.D.   On: 10/16/2023 17:01      Time coordinating discharge: over 30 minutes  SIGNED:  Sunnie Nielsen DO [facility]

## 2023-10-19 NOTE — Op Note (Signed)
 [facility] Gastroenterology Patient Name: [name] Procedure Date: 10/19/2023 12:05 PM MRN: [id number] Account #: [id number] Date of Birth: 07-22-1946 Admit Type: Inpatient Age: 78 Room: [facility] ENDO ROOM 4 Gender: Male Note Status: Finalized Instrument Name: Upper Endoscope [id number] Procedure:             Upper GI endoscopy Indications:           Iron deficiency anemia Providers:             Midge Minium MD, MD Referring MD:          Jillene Bucks. Arlana Pouch, MD (Referring MD) Medicines:             Propofol per Anesthesia Complications:         No immediate complications. Procedure:             Pre-Anesthesia Assessment:                        - Prior to the procedure, a History and Physical was                         performed, and patient medications and allergies were                         reviewed. The patient's tolerance of previous                         anesthesia was also reviewed. The risks and benefits                         of the procedure and the sedation options and risks                         were discussed with the patient. All questions were                         answered, and informed consent was obtained. Prior                         Anticoagulants: The patient has taken no anticoagulant                         or antiplatelet agents. ASA Grade Assessment: II - A                         patient with mild systemic disease. After reviewing                         the risks and benefits, the patient was deemed in                         satisfactory condition to undergo the procedure.                        After obtaining informed consent, the endoscope was                         passed under direct vision. Throughout the procedure,  the patient's blood pressure, pulse, and oxygen                         saturations were monitored continuously. The Endoscope                         was introduced through the mouth, and  advanced to the                         second part of duodenum. The upper GI endoscopy was                         accomplished without difficulty. The patient tolerated                         the procedure well. Findings:      The examined esophagus was normal.      Multiple sessile polyps with no bleeding and no stigmata of recent       bleeding were found in the entire examined stomach.      The examined duodenum was normal. Impression:            - Normal esophagus.                        - Multiple gastric polyps.                        - Normal examined duodenum.                        - No specimens collected. Recommendation:        - Return patient to hospital ward for ongoing care.                        - Resume previous diet.                        - Continue present medications.                        - Repeat upper endoscopy as an outpaint for                         retreatment. Procedure Code(s):     --- Professional ---                        [phone], Esophagogastroduodenoscopy, flexible,                         transoral; diagnostic, including collection of                         specimen(s) by brushing or washing, when performed                         (separate procedure) Diagnosis Code(s):     --- Professional ---                        D50.9, Iron deficiency anemia, unspecified CPT copyright 2022 [employer]. All  rights reserved. The codes documented in this report are preliminary and upon coder review may  be revised to meet current compliance requirements. Midge Minium MD, MD 10/19/2023 12:15:39 PM This report has been signed electronically. Number of Addenda: 0 Note Initiated On: 10/19/2023 12:05 PM Estimated Blood Loss:  Estimated blood loss: none.      [facility]

## 2023-10-19 NOTE — Anesthesia Postprocedure Evaluation (Signed)
 Anesthesia Post Note  Patient: [name]  Procedure(s) Performed: EGD (ESOPHAGOGASTRODUODENOSCOPY) COLONOSCOPY POLYPECTOMY  Patient location during evaluation: PACU Anesthesia Type: General Level of consciousness: awake and alert Pain management: pain level controlled Vital Signs Assessment: post-procedure vital signs reviewed and stable Respiratory status: spontaneous breathing, nonlabored ventilation, respiratory function stable and patient connected to nasal cannula oxygen Cardiovascular status: blood pressure returned to baseline and stable Postop Assessment: no apparent nausea or vomiting Anesthetic complications: no   No notable events documented.   Last Vitals:  Vitals:   10/19/23 1255 10/19/23 1300  BP: 109/63 131/63  Pulse: 66 65  Resp: 19 19  Temp:    SpO2: 95% 96%    Last Pain:  Vitals:   10/19/23 1255  TempSrc:   PainSc: 0-No pain                 Corinda Gubler

## 2023-10-19 NOTE — Transfer of Care (Signed)
 Immediate Anesthesia Transfer of Care Note  Patient: [name]  Procedure(s) Performed: EGD (ESOPHAGOGASTRODUODENOSCOPY) COLONOSCOPY POLYPECTOMY  Patient Location: Endoscopy Unit  Anesthesia Type:General  Level of Consciousness: drowsy  Airway & Oxygen Therapy: Patient Spontanous Breathing and Patient connected to face mask oxygen  Post-op Assessment: Report given to RN and Post -op Vital signs reviewed and stable  Post vital signs: Reviewed and stable  Last Vitals:  Vitals Value Taken Time  BP 107/66 10/19/23 1238  Temp    Pulse 66 10/19/23 1240  Resp 14 10/19/23 1240  SpO2 100 % 10/19/23 1240  Vitals shown include unfiled device data.  Last Pain:  Vitals:   10/19/23 1200  TempSrc: Temporal  PainSc: 0-No pain         Complications: No notable events documented.

## 2023-10-19 NOTE — Anesthesia Preprocedure Evaluation (Signed)
 Anesthesia Evaluation  Patient identified by MRN, date of birth, ID band Patient awake  General Assessment Comment:  Presented to hospital with SOB/chest pain. Found to be profoundly anemic. S/p 3u PRBC, and 2L thoracentesis for chronic pleural effusion. Patient feels much better after these interventions. No nausea/vomiting.  Reviewed: Allergy & Precautions, H&P , NPO status , Patient's Chart, lab work & pertinent test results, reviewed documented beta blocker date and time   Airway Mallampati: II   Neck ROM: full    Dental  (+) Poor Dentition   Pulmonary shortness of breath, sleep apnea , former smoker   Pulmonary exam normal        Cardiovascular Exercise Tolerance: Poor hypertension, On Medications + angina with exertion + CAD, + Cardiac Stents, + CABG and +CHF  + dysrhythmias Atrial Fibrillation + Valvular Problems/Murmurs MR and AS  Rhythm:Irregular Rate:Normal   1. Left ventricular ejection fraction, by estimation, is 60 to 65%. The  left ventricle has normal function. The left ventricle has no regional  wall motion abnormalities. Left ventricular diastolic parameters were  normal.   2. Right ventricular systolic function is normal. The right ventricular  size is normal.   3. Left atrial size was mildly dilated.   4. Right atrial size was mildly dilated.   5. The mitral valve is normal in structure. Mild to moderate mitral valve  regurgitation. No evidence of mitral stenosis.   6. The aortic valve is normal in structure. Aortic valve regurgitation is  mild to moderate. Mild to moderate aortic valve stenosis.   7. The inferior vena cava is normal in size with greater than 50%  respiratory variability, suggesting right atrial pressure of 3 mmHg.     Neuro/Psych  PSYCHIATRIC DISORDERS      negative neurological ROS     GI/Hepatic Neg liver ROS,GERD  Medicated,,  Endo/Other  negative endocrine ROSdiabetes     Renal/GU Renal disease  negative genitourinary   Musculoskeletal   Abdominal   Peds  Hematology  (+) Blood dyscrasia, anemia   Anesthesia Other Findings Past Medical History: 07/25/2016: (HFpEF) heart failure with preserved ejection fraction  (HCC)     Comment:  a.)TTE 07/25/16: EF >55, triv-mild pan regur, mild AS,               G2DD; b.)TTE 09/02/16: EF >55, triv TR, mild AS; c.)TTE               11/05/16: EF >55, LAE, triv-mild pan regur, mild AS, G2DD;              d.)TTE 01/21/20: EF >55, mod LVH, LAE/RVE, triv-mild               AR/PR/TR, mod MR, mild AS, G2DD; e.)TTE 09/24/22: EF>55,               sev LVH, sev LAE, RAE, mild PR, mod AR/MR/TR, mild-mod               AS, RVSP 74.7; f.)TTE 12/23/22: EF 60-65, LAE, mild-mod               MR/TR, mild AS 05/01/2023: Abnormal urine No date: Anemia No date: Aortic atherosclerosis (HCC) 07/25/2016: Aortic stenosis     Comment:  a.) TTE 07/25/2016: mild (MPG 13.5); b.) TTE 09/02/2016:              mild (MPG 16); c.) TTE 11/05/2016: mild (MPG 9); d.) TTE  01/21/2020: mild (MPG 11.7); e.) TTE 09/24/2022: mild-mod              (MPG 20.3); f.) TTE 12/23/2022: mild (MPG 15) No date: Atrial fibrillation (HCC)     Comment:  a.) CHA2DS2VASc = 6 (age x2, CHF, HTN, vascular disease               history, T2DM);  b.) rate/rhythm maintained on oral               labetalol; chronically anticoagulated with apixaban +               clopidogrel No date: AV block, 1st degree No date: B12 deficiency No date: CKD (chronic kidney disease), stage III (HCC) No date: Colon polyps 05/01/2023: Congestive heart failure (HCC) 08/16/2016: Coronary artery disease     Comment:  a.) s/p 3v CABG 08/30/2016; b.) s/p PCI 11/26/2016 (DES               x 1 oSVG-RCA); c.) s/p PCI 12/17/2016 (DES x 5 --> mLCx               x2, dLCx, pLAD, dLAD); c.) s/p PCI 06/09/2020 (DES x1 -->              ISR oSVG-PDA) 07/03/2023: Cyst of kidney, acquired No date:  Dyspnea 05/01/2023: Gastroesophageal reflux disease No date: GERD (gastroesophageal reflux disease) No date: Heart murmur No date: Hepatic steatosis 09/02/2020: History of 2019 novel coronavirus disease (COVID-19)     Comment:  a.) Tx'd with remdesivir No date: History of blood transfusion 09/02/2020: History of GI bleed     Comment:  a.) admitted to [facility] 09/02/2020 - 09/08/2020 11/26/2016: History of heart artery stent     Comment:  TOTAL of 7 stents: a.) 11/26/2016 --> 3.5 x 22 mm               Resolute Integrity oSVG-RCA; b.) 12/17/2016: 3.0 x 12 mm               Xience Alpine dLCx, 3.5 x 22 mm Resolute Onyx mLCx, 3.5 x              18 mm Resolute Onyx mLCx, 3.0 x 26 mm Resolute Onyx pLAD,              2.0 x 26 mm Resolute Onyx dLAD; c.) 06/09/2020 --> 2.5 x               18 mm Resolute Onyx ISR oSVG-PDA 08/12/2012: History of partial colectomy     Comment:  a.) large gastric hyperplastic polyp removal No date: HLD (hyperlipidemia) No date: Hypertension No date: LBBB (left bundle branch block) No date: Long term current use of anticoagulant     Comment:  a.) apixaban No date: Long term current use of antithrombotics/antiplatelets     Comment:  a.) clopidogrel No date: Male hypogonadism     Comment:  a.) on exogenous TRT (1.62% gel) No date: Mild cardiomegaly No date: Mild gynecomastia (bilateral) No date: Nephrolithiasis No date: OSA (obstructive sleep apnea)     Comment:  a.) does not utilize nocturnal PAP therapy 05/01/2023: Personal history of surgery to heart and great vessels,  presenting hazards to health 08/01/2023: Proteinuria, unspecified 09/24/2022: Pulmonary hypertension (HCC)     Comment:  a.) TTE 09/24/2022: RVSP 74.7; b.) TTE 12/23/2022: RVSP               31.3 No date: RBBB (right bundle branch  block with left anterior  fascicular block) No date: Right inguinal hernia No date: Right thyroid nodule 08/30/2016: S/P CABG x 3     Comment:  a.) LIMA-LAD,  SVG-PDA, SVG-OM3 05/01/2023: Stage 3b chronic kidney disease (HCC) No date: Type 2 diabetes mellitus treated with insulin (HCC) 05/01/2023: Type 2 diabetes mellitus with diabetic chronic kidney  disease (HCC) No date: Umbilical hernia No date: Unstable angina (HCC) No date: Vitamin D deficiency Past Surgical History: No date: APPENDECTOMY 08/16/2016: CARDIAC CATHETERIZATION; Left     Comment:  Procedure: Left Heart Cath and Coronary Angiography;                Surgeon: Alwyn Pea, MD;  Location: [facility] INVASIVE               CV LAB;  Service: Cardiovascular;  Laterality: Left; 09/07/2020: COLONOSCOPY; N/A     Comment:  Procedure: COLONOSCOPY;  Surgeon: Pasty Spillers,               MD;  Location: [facility] ENDOSCOPY;  Service: Endoscopy;                Laterality: N/A; 05/17/2021: COLONOSCOPY WITH PROPOFOL; N/A     Comment:  Procedure: COLONOSCOPY WITH PROPOFOL;  Surgeon:               Pasty Spillers, MD;  Location: [facility] ENDOSCOPY;                Service: Endoscopy;  Laterality: N/A; 11/26/2016: CORONARY ANGIOPLASTY WITH STENT PLACEMENT; Left     Comment:  Procedure: CORONARY ANGIOPLASTY WITH STENT PLACEMENT;               Location: [facility]; Surgeon: Jerrell Mylar, MD 08/30/2016: CORONARY ARTERY BYPASS GRAFT; N/A     Comment:  Procedure: CORONARY ARTERY BYPASS GRAFT; Location: [facility];              Surgeon: Nance Pew, MD 06/09/2020: CORONARY STENT INTERVENTION; N/A     Comment:  Procedure: CORONARY STENT INTERVENTION;  Surgeon:               Alwyn Pea, MD;  Location: [facility] INVASIVE CV LAB;               Service: Cardiovascular;  Laterality: N/A; 12/17/2016: CORONARY STENT INTERVENTION; Left     Comment:  Procedure: CORONARY STENT INTERVENTION; Location: [facility];               Surgeon: Jerrell Mylar, MD 09/22/2020: ENDOSCOPIC MUCOSAL RESECTION; N/A     Comment:  Procedure: ENDOSCOPIC MUCOSAL RESECTION;  Surgeon:               Meridee Score Netty Starring., MD;  Location: [facility]  ENDOSCOPY;                Service: Gastroenterology;  Laterality: N/A; 09/07/2020: ESOPHAGOGASTRODUODENOSCOPY; N/A     Comment:  Procedure: ESOPHAGOGASTRODUODENOSCOPY (EGD);  Surgeon:               Pasty Spillers, MD;  Location: [facility] ENDOSCOPY;                Service: Endoscopy;  Laterality: N/A; 09/22/2020: ESOPHAGOGASTRODUODENOSCOPY (EGD) WITH PROPOFOL; N/A     Comment:  Procedure: ESOPHAGOGASTRODUODENOSCOPY (EGD) WITH               PROPOFOL;  Surgeon: Meridee Score Netty Starring., MD;                Location: [facility] ENDOSCOPY;  Service: Gastroenterology;                Laterality: N/A; No date: FRACTURE SURGERY 09/22/2020: HEMOSTASIS CLIP PLACEMENT     Comment:  Procedure: HEMOSTASIS CLIP PLACEMENT;  Surgeon:               Lemar Lofty., MD;  Location: [facility] ENDOSCOPY;                Service: Gastroenterology;; 09/22/2020: HEMOSTASIS CONTROL     Comment:  Procedure: HEMOSTASIS CONTROL;  Surgeon: Lemar Lofty., MD;  Location: [facility] ENDOSCOPY;  Service:               Gastroenterology;; 02/19/2023: INSERTION OF MESH     Comment:  Procedure: INSERTION OF MESH;  Surgeon: Henrene Dodge,               MD;  Location: [facility] ORS;  Service: General;;  umbilical 08/12/2012: LAPAROSCOPIC PARTIAL RIGHT COLECTOMY; Right     Comment:  Procedure: LAPAROSCOPIC PARTIAL RIGHT COLECTOMY;               Location: [facility]; Surgeon: Renda Rolls, MD 06/09/2020: LEFT HEART CATH AND CORONARY ANGIOGRAPHY; Left     Comment:  Procedure: LEFT HEART CATH AND CORONARY ANGIOGRAPHY;                Surgeon: Alwyn Pea, MD;  Location: [facility] INVASIVE              CV LAB;  Service: Cardiovascular;  Laterality: Left; 11/20/2016: LEFT HEART CATH AND CORS/GRAFTS ANGIOGRAPHY; Left     Comment:  Procedure: Left Heart Cath and Cors/Grafts Angiography;               Surgeon: Alwyn Pea, MD;  Location: [facility] INVASIVE               CV LAB;  Service: Cardiovascular;  Laterality: Left; 09/22/2020:  POLYPECTOMY     Comment:  Procedure: POLYPECTOMY;  Surgeon: Lemar Lofty., MD;  Location: [facility] ENDOSCOPY;  Service:               Gastroenterology;; 09/22/2020: SUBMUCOSAL LIFTING INJECTION     Comment:  Procedure: SUBMUCOSAL LIFTING INJECTION;  Surgeon:               Lemar Lofty., MD;  Location: [facility] ENDOSCOPY;                Service: Gastroenterology;; 02/19/2023: UMBILICAL HERNIA REPAIR; N/A     Comment:  Procedure: HERNIA REPAIR UMBILICAL ADULT, open;                Surgeon: Henrene Dodge, MD;  Location: [facility] ORS;                Service: General;  Laterality: N/A; 02/19/2023: XI ROBOTIC ASSISTED INGUINAL HERNIA REPAIR WITH MESH; Right     Comment:  Procedure: XI ROBOTIC ASSISTED INGUINAL HERNIA REPAIR               WITH MESH;  Surgeon: Henrene Dodge, MD;  Location: [facility]               ORS;  Service: General;  Laterality: Right; BMI    Body Mass Index: 27.22 kg/m     Reproductive/Obstetrics negative OB ROS  Anesthesia Physical Anesthesia Plan  ASA: 3  Anesthesia Plan: General   Post-op Pain Management: Minimal or no pain anticipated   Induction: Intravenous  PONV Risk Score and Plan: 2 and Propofol infusion, TIVA and Ondansetron  Airway Management Planned: Nasal Cannula  Additional Equipment: None  Intra-op Plan:   Post-operative Plan:   Informed Consent: I have reviewed the patients History and Physical, chart, labs and discussed the procedure including the risks, benefits and alternatives for the proposed anesthesia with the patient or authorized representative who has indicated his/her understanding and acceptance.     Dental Advisory Given  Plan Discussed with: CRNA  Anesthesia Plan Comments: (Discussed risks of anesthesia with patient, including possibility of difficulty with spontaneous ventilation under anesthesia necessitating airway intervention, PONV, and rare risks such as  cardiac or respiratory or neurological events, and allergic reactions. Discussed the role of CRNA in patient's perioperative care. Patient understands.)       Anesthesia Quick Evaluation

## 2023-10-19 NOTE — Op Note (Signed)
 [facility] Gastroenterology Patient Name: [name] Procedure Date: 10/19/2023 12:05 PM MRN: [id number] Account #: [id number] Date of Birth: 03-13-1946 Admit Type: Inpatient Age: 77 Room: [facility] ENDO ROOM 4 Gender: Male Note Status: Finalized Instrument Name: Prentice Docker [id number] Procedure:             Colonoscopy Indications:           Iron deficiency anemia secondary to chronic blood loss Providers:             Midge Minium MD, MD Referring MD:          Jillene Bucks. Arlana Pouch, MD (Referring MD) Medicines:             Propofol per Anesthesia Complications:         No immediate complications. Procedure:             Pre-Anesthesia Assessment:                        - Prior to the procedure, a History and Physical was                         performed, and patient medications and allergies were                         reviewed. The patient's tolerance of previous                         anesthesia was also reviewed. The risks and benefits                         of the procedure and the sedation options and risks                         were discussed with the patient. All questions were                         answered, and informed consent was obtained. Prior                         Anticoagulants: The patient has taken anticoagulant                         medication, last dose was 4 days prior to procedure.                         ASA Grade Assessment: II - A patient with mild                         systemic disease. After reviewing the risks and                         benefits, the patient was deemed in satisfactory                         condition to undergo the procedure.                        After obtaining informed consent, the colonoscope was  passed under direct vision. Throughout the procedure,                         the patient's blood pressure, pulse, and oxygen                         saturations were monitored continuously. The                          Colonoscope was introduced through the anus and                         advanced to the the ileocolonic anastomosis. The                         colonoscopy was performed without difficulty. The                         patient tolerated the procedure well. The quality of                         the bowel preparation was excellent. Findings:      The perianal and digital rectal examinations were normal.      Two sessile polyps were found in the transverse colon. The polyps were 2       to 6 mm in size. These polyps were removed with a cold snare. Resection       and retrieval were complete.      There was evidence of a prior side-to-side colo-colonic anastomosis in       the transverse colon. This was patent.      Non-bleeding internal hemorrhoids were found during retroflexion. The       hemorrhoids were Grade III (internal hemorrhoids that prolapse but       require manual reduction). Impression:            - Two 2 to 6 mm polyps in the transverse colon,                         removed with a cold snare. Resected and retrieved.                        - Patent side-to-side colo-colonic anastomosis.                        - Non-bleeding internal hemorrhoids. Recommendation:        - Discharge patient to home.                        - Resume previous diet.                        - Continue present medications.                        - Await pathology results. Procedure Code(s):     --- Professional ---                        [phone], Colonoscopy, flexible; with removal of  tumor(s), polyp(s), or other lesion(s) by snare                         technique Diagnosis Code(s):     --- Professional ---                        D50.0, Iron deficiency anemia secondary to blood loss                         (chronic)                        D12.3, Benign neoplasm of transverse colon (hepatic                         flexure or splenic flexure) CPT copyright  2022 [employer]. All rights reserved. The codes documented in this report are preliminary and upon coder review may  be revised to meet current compliance requirements. Midge Minium MD, MD 10/19/2023 12:36:59 PM This report has been signed electronically. Number of Addenda: 0 Note Initiated On: 10/19/2023 12:05 PM Scope Withdrawal Time: 0 hours 8 minutes 12 seconds  Total Procedure Duration: 0 hours 15 minutes 2 seconds  Estimated Blood Loss:  Estimated blood loss: none.      [facility]

## 2023-10-19 NOTE — Plan of Care (Signed)
  Problem: Education: Goal: Knowledge of General Education information will improve Description: Including pain rating scale, medication(s)/side effects and non-pharmacologic comfort measures Outcome: Progressing   Problem: Health Behavior/Discharge Planning: Goal: Ability to manage health-related needs will improve Outcome: Progressing   Problem: Clinical Measurements: Goal: Diagnostic test results will improve Outcome: Progressing   Problem: Nutrition: Goal: Adequate nutrition will be maintained Outcome: Progressing   Problem: Elimination: Goal: Will not experience complications related to bowel motility Outcome: Progressing   Problem: Safety: Goal: Ability to remain free from injury will improve Outcome: Progressing

## 2023-10-21 ENCOUNTER — Other Ambulatory Visit: Payer: Medicare HMO

## 2023-10-21 ENCOUNTER — Encounter: Payer: Self-pay | Admitting: Gastroenterology

## 2023-10-21 ENCOUNTER — Ambulatory Visit: Payer: Medicare HMO | Admitting: Oncology

## 2023-10-22 LAB — SURGICAL PATHOLOGY

## 2023-10-23 ENCOUNTER — Other Ambulatory Visit: Payer: Self-pay

## 2023-10-24 ENCOUNTER — Encounter: Payer: Self-pay | Admitting: Gastroenterology

## 2023-10-24 ENCOUNTER — Encounter: Payer: Self-pay | Admitting: Nurse Practitioner

## 2023-10-24 ENCOUNTER — Other Ambulatory Visit: Payer: Self-pay

## 2023-10-24 ENCOUNTER — Inpatient Hospital Stay: Admitting: Nurse Practitioner

## 2023-10-24 ENCOUNTER — Inpatient Hospital Stay: Attending: Oncology

## 2023-10-24 ENCOUNTER — Inpatient Hospital Stay

## 2023-10-24 VITALS — BP 158/67 | HR 60 | Temp 97.1°F | Resp 16 | Wt 208.0 lb

## 2023-10-24 VITALS — BP 158/69 | HR 60 | Temp 96.0°F | Resp 17

## 2023-10-24 DIAGNOSIS — D500 Iron deficiency anemia secondary to blood loss (chronic): Secondary | ICD-10-CM | POA: Diagnosis not present

## 2023-10-24 DIAGNOSIS — D508 Other iron deficiency anemias: Secondary | ICD-10-CM

## 2023-10-24 DIAGNOSIS — D649 Anemia, unspecified: Secondary | ICD-10-CM

## 2023-10-24 DIAGNOSIS — D509 Iron deficiency anemia, unspecified: Secondary | ICD-10-CM | POA: Insufficient documentation

## 2023-10-24 LAB — IRON AND TIBC
Iron: 58 ug/dL (ref 45–182)
Saturation Ratios: 18 % (ref 17.9–39.5)
TIBC: 330 ug/dL (ref 250–450)
UIBC: 272 ug/dL

## 2023-10-24 LAB — COMPREHENSIVE METABOLIC PANEL
ALT: 49 U/L — ABNORMAL HIGH (ref 0–44)
AST: 48 U/L — ABNORMAL HIGH (ref 15–41)
Albumin: 3.3 g/dL — ABNORMAL LOW (ref 3.5–5.0)
Alkaline Phosphatase: 109 U/L (ref 38–126)
Anion gap: 13 (ref 5–15)
BUN: 32 mg/dL — ABNORMAL HIGH (ref 8–23)
CO2: 24 mmol/L (ref 22–32)
Calcium: 9.4 mg/dL (ref 8.9–10.3)
Chloride: 103 mmol/L (ref 98–111)
Creatinine, Ser: 2.85 mg/dL — ABNORMAL HIGH (ref 0.61–1.24)
GFR, Estimated: 22 mL/min — ABNORMAL LOW (ref >60)
Glucose, Bld: 131 mg/dL — ABNORMAL HIGH (ref 70–99)
Potassium: 4 mmol/L (ref 3.5–5.1)
Sodium: 140 mmol/L (ref 135–145)
Total Bilirubin: 0.8 mg/dL (ref 0.0–1.2)
Total Protein: 6.6 g/dL (ref 6.5–8.1)

## 2023-10-24 LAB — SAMPLE TO BLOOD BANK

## 2023-10-24 LAB — CBC
HCT: 26 % — ABNORMAL LOW (ref 39.0–52.0)
Hemoglobin: 7.7 g/dL — ABNORMAL LOW (ref 13.0–17.0)
MCH: 30.1 pg (ref 26.0–34.0)
MCHC: 29.6 g/dL — ABNORMAL LOW (ref 30.0–36.0)
MCV: 101.6 fL — ABNORMAL HIGH (ref 80.0–100.0)
Platelets: 100 10*3/uL — ABNORMAL LOW (ref 150–400)
RBC: 2.56 MIL/uL — ABNORMAL LOW (ref 4.22–5.81)
RDW: 15.6 % — ABNORMAL HIGH (ref 11.5–15.5)
WBC: 5.5 10*3/uL (ref 4.0–10.5)
nRBC: 0 % (ref 0.0–0.2)

## 2023-10-24 LAB — FERRITIN: Ferritin: 57 ng/mL (ref 24–336)

## 2023-10-24 LAB — VITAMIN B12: Vitamin B-12: 763 pg/mL (ref 180–914)

## 2023-10-24 MED ORDER — SODIUM CHLORIDE 0.9% FLUSH
10.0000 mL | Freq: Once | INTRAVENOUS | Status: AC | PRN
  Administered 2023-10-24: 10 mL
  Filled 2023-10-24: qty 10

## 2023-10-24 MED ORDER — IRON SUCROSE 20 MG/ML IV SOLN
200.0000 mg | INTRAVENOUS | Status: DC
  Administered 2023-10-24: 200 mg via INTRAVENOUS

## 2023-10-24 NOTE — Progress Notes (Signed)
 Hematology/Oncology Consult Note [facility]  Telephone:([phone] [phone]  Patient Care Team: Westley Hammers, MD as PCP - General (Internal Medicine) Candyce Champagne, MD as Consulting Physician (General Surgery) Irby Mannan, MD (Inactive) as Consulting Physician (Gastroenterology) Antonette Batters, MD as Consulting Physician (Cardiology) Avonne Boettcher, MD as Consulting Physician (Oncology)   Name of the patient: [name]  [id number]  [birth year]   Date of visit: 10/24/23  Diagnosis- anemia - chronic disease and iron  deficiency  Chief complaint/ Reason for visit- routine follow-up of anemia  Heme/Onc history: Patient is a 78 year old male with history of multiple gastric polyps who was seen by both Dr. Brice Campi and Dr. Tully Gainer.  He has undergone EGD with Dr. Brice Campi and had a large gastric hyperplastic polyp removal.  Prior to that patient had significant iron  deficiency and his aspirin  and Plavix  were on hold.  These were restarted after EGD.  Most recent CBC on 11/01/2020 showed white count of 6.8, H&H of 11.3/36.4 with an MCV of 85 and a platelet count of 158.  Iron  studies were normal.  Patient denies any bleeding in his stool or dark melanotic stools.   Patient received 5 doses of Venofer  in October 2022 and his hemoglobin improved from 7-12  Interval history- Patient is 78 year old male with above history of iron  deficiency anemia who returns to clinic for follow up. In interim, he went to ER for worsening shortness of breath and chest pain worsening over past few days with black stools. Hmg 6, worsening renal function. Underwent EGD, normal, colonoscopy- polyps removed, patent colo-colonic anastomosis, non bleeding internal hemorrhoids. He received transfusions x 3, IV protonix , now on home PPI, plavix  and eliquis  (coronary stents) were held, and was discharged on 10/19/23. He also was found to have incidental bilateral pleural  effusions and underwent thoracentesis in the setting of CHF and CAD.   He continues to feel generally weak but better than when he was admitted to hospital. Feels strong enough to perform ADLs or walk up stairs. Black stools have resolved. Complains of metallic/iron  like taste in his mouth that has been present for year or more and interferes with nutrition and QOL. No concerns with smell.   ECOG PS- 1 Pain scale- 0  Review of systems- Review of Systems  Constitutional:  Positive for malaise/fatigue. Negative for chills, fever and weight loss.  HENT:  Negative for congestion, ear discharge and nosebleeds.        Metallic taste  Eyes:  Negative for blurred vision.  Respiratory:  Negative for cough, hemoptysis, sputum production, shortness of breath and wheezing.   Cardiovascular:  Negative for chest pain, palpitations, orthopnea and claudication.  Gastrointestinal:  Negative for abdominal pain, blood in stool, constipation, diarrhea, heartburn, melena, nausea and vomiting.  Genitourinary:  Negative for dysuria, flank pain, frequency, hematuria and urgency.  Musculoskeletal:  Negative for back pain, falls, joint pain and myalgias.  Skin:  Negative for rash.  Neurological:  Negative for dizziness, tingling, focal weakness, seizures, weakness and headaches.  Endo/Heme/Allergies:  Does not bruise/bleed easily.  Psychiatric/Behavioral:  Negative for depression and suicidal ideas. The patient does not have insomnia.     Allergies  Allergen Reactions   Isosorbide  Cough   Past Medical History:  Diagnosis Date   (HFpEF) heart failure with preserved ejection fraction (HCC) 07/25/2016   a.)TTE 07/25/16: EF >55, triv-mild pan regur, mild AS, G2DD; b.)TTE 09/02/16: EF >55, triv TR, mild AS; c.)TTE 11/05/16: EF >55,  LAE, triv-mild pan regur, mild AS, G2DD; d.)TTE 01/21/20: EF >55, mod LVH, LAE/RVE, triv-mild AR/PR/TR, mod MR, mild AS, G2DD; e.)TTE 09/24/22: EF>55, sev LVH, sev LAE, RAE, mild PR, mod  AR/MR/TR, mild-mod AS, RVSP 74.7; f.)TTE 12/23/22: EF 60-65, LAE, mild-mod MR/TR, mild AS   Abnormal urine 05/01/2023   Anemia    Aortic atherosclerosis (HCC)    Aortic stenosis 07/25/2016   a.) TTE 07/25/2016: mild (MPG 13.5); b.) TTE 09/02/2016: mild (MPG 16); c.) TTE 11/05/2016: mild (MPG 9); d.) TTE 01/21/2020: mild (MPG 11.7); e.) TTE 09/24/2022: mild-mod (MPG 20.3); f.) TTE 12/23/2022: mild (MPG 15)   Atrial fibrillation (HCC)    a.) CHA2DS2VASc = 6 (age x2, CHF, HTN, vascular disease history, T2DM);  b.) rate/rhythm maintained on oral labetalol ; chronically anticoagulated with apixaban  + clopidogrel    AV block, 1st degree    B12 deficiency    CKD (chronic kidney disease), stage III (HCC)    Colon polyps    Congestive heart failure (HCC) 05/01/2023   Coronary artery disease 08/16/2016   a.) s/p 3v CABG 08/30/2016; b.) s/p PCI 11/26/2016 (DES x 1 oSVG-RCA); c.) s/p PCI 12/17/2016 (DES x 5 --> mLCx x2, dLCx, pLAD, dLAD); c.) s/p PCI 06/09/2020 (DES x1 --> ISR oSVG-PDA)   Cyst of kidney, acquired 07/03/2023   Dyspnea    Gastroesophageal reflux disease 05/01/2023   GERD (gastroesophageal reflux disease)    Heart murmur    Hepatic steatosis    History of 2019 novel coronavirus disease (COVID-19) 09/02/2020   a.) Tx'd with remdesivir    History of blood transfusion    History of GI bleed 09/02/2020   a.) admitted to [facility] 09/02/2020 - 09/08/2020   History of heart artery stent 11/26/2016   TOTAL of 7 stents: a.) 11/26/2016 --> 3.5 x 22 mm Resolute Integrity oSVG-RCA; b.) 12/17/2016: 3.0 x 12 mm Xience Alpine dLCx, 3.5 x 22 mm Resolute Onyx mLCx, 3.5 x 18 mm Resolute Onyx mLCx, 3.0 x 26 mm Resolute Onyx pLAD, 2.0 x 26 mm Resolute Onyx dLAD; c.) 06/09/2020 --> 2.5 x 18 mm Resolute Onyx ISR oSVG-PDA   History of partial colectomy 08/12/2012   a.) large gastric hyperplastic polyp removal   HLD (hyperlipidemia)    Hypertension    LBBB (left bundle branch block)    Long term current use of  anticoagulant    a.) apixaban    Long term current use of antithrombotics/antiplatelets    a.) clopidogrel    Male hypogonadism    a.) on exogenous TRT (1.62% gel)   Mild cardiomegaly    Mild gynecomastia (bilateral)    Nephrolithiasis    OSA (obstructive sleep apnea)    a.) does not utilize nocturnal PAP therapy   Personal history of surgery to heart and great vessels, presenting hazards to health 05/01/2023   Proteinuria, unspecified 08/01/2023   Pulmonary hypertension (HCC) 09/24/2022   a.) TTE 09/24/2022: RVSP 74.7; b.) TTE 12/23/2022: RVSP 31.3   RBBB (right bundle branch block with left anterior fascicular block)    Right inguinal hernia    Right thyroid  nodule    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   a.) LIMA-LAD, SVG-PDA, SVG-OM3   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/01/2023   Type 2 diabetes mellitus treated with insulin  (HCC)    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   Umbilical hernia    Unstable angina (HCC)    Vitamin D  deficiency    Past Surgical History:  Procedure Laterality Date  APPENDECTOMY     CARDIAC CATHETERIZATION Left 08/16/2016   Procedure: Left Heart Cath and Coronary Angiography;  Surgeon: Antonette Batters, MD;  Location: [facility] INVASIVE CV LAB;  Service: Cardiovascular;  Laterality: Left;   COLONOSCOPY N/A 09/07/2020   Procedure: COLONOSCOPY;  Surgeon: Irby Mannan, MD;  Location: [facility] ENDOSCOPY;  Service: Endoscopy;  Laterality: N/A;   COLONOSCOPY N/A 10/19/2023   Procedure: COLONOSCOPY;  Surgeon: Marnee Sink, MD;  Location: [facility] ENDOSCOPY;  Service: Endoscopy;  Laterality: N/A;   COLONOSCOPY WITH PROPOFOL  N/A 05/17/2021   Procedure: COLONOSCOPY WITH PROPOFOL ;  Surgeon: Irby Mannan, MD;  Location: [facility] ENDOSCOPY;  Service: Endoscopy;  Laterality: N/A;   CORONARY ANGIOPLASTY WITH STENT PLACEMENT Left 11/26/2016   Procedure: CORONARY ANGIOPLASTY WITH STENT PLACEMENT; Location: [facility]; Surgeon: Lynnette Saucer,  MD   CORONARY ARTERY BYPASS GRAFT N/A 08/30/2016   Procedure: CORONARY ARTERY BYPASS GRAFT; Location: [facility]; Surgeon: Ammon Kanaris, MD   CORONARY STENT INTERVENTION N/A 06/09/2020   Procedure: CORONARY STENT INTERVENTION;  Surgeon: Antonette Batters, MD;  Location: [facility] CV LAB;  Service: Cardiovascular;  Laterality: N/A;   CORONARY STENT INTERVENTION Left 12/17/2016   Procedure: CORONARY STENT INTERVENTION; Location: [facility]; Surgeon: Lynnette Saucer, MD   ENDOSCOPIC MUCOSAL RESECTION N/A 09/22/2020   Procedure: ENDOSCOPIC MUCOSAL RESECTION;  Surgeon: Normie Becton., MD;  Location: [facility] ENDOSCOPY;  Service: Gastroenterology;  Laterality: N/A;   ESOPHAGOGASTRODUODENOSCOPY N/A 09/07/2020   Procedure: ESOPHAGOGASTRODUODENOSCOPY (EGD);  Surgeon: Irby Mannan, MD;  Location: [facility] ENDOSCOPY;  Service: Endoscopy;  Laterality: N/A;   ESOPHAGOGASTRODUODENOSCOPY N/A 10/19/2023   Procedure: EGD (ESOPHAGOGASTRODUODENOSCOPY);  Surgeon: Marnee Sink, MD;  Location: [facility] ENDOSCOPY;  Service: Endoscopy;  Laterality: N/A;   ESOPHAGOGASTRODUODENOSCOPY (EGD) WITH PROPOFOL  N/A 09/22/2020   Procedure: ESOPHAGOGASTRODUODENOSCOPY (EGD) WITH PROPOFOL ;  Surgeon: Brice Campi Albino Alu., MD;  Location: [facility] ENDOSCOPY;  Service: Gastroenterology;  Laterality: N/A;   ESOPHAGOGASTRODUODENOSCOPY (EGD) WITH PROPOFOL  N/A 08/26/2023   Procedure: ESOPHAGOGASTRODUODENOSCOPY (EGD) WITH PROPOFOL ;  Surgeon: Marnee Sink, MD;  Location: [facility] ENDOSCOPY;  Service: Endoscopy;  Laterality: N/A;   FRACTURE SURGERY     HEMOSTASIS CLIP PLACEMENT  09/22/2020   Procedure: HEMOSTASIS CLIP PLACEMENT;  Surgeon: Brice Campi Albino Alu., MD;  Location: [facility] ENDOSCOPY;  Service: Gastroenterology;;   HEMOSTASIS CONTROL  09/22/2020   Procedure: HEMOSTASIS CONTROL;  Surgeon: Normie Becton., MD;  Location: [facility] ENDOSCOPY;  Service: Gastroenterology;;   INSERTION OF MESH  02/19/2023   Procedure: INSERTION OF MESH;  Surgeon: Emmalene Hare, MD;  Location: [facility] ORS;  Service: General;;  umbilical   LAPAROSCOPIC PARTIAL RIGHT COLECTOMY Right 08/12/2012   Procedure: LAPAROSCOPIC PARTIAL RIGHT COLECTOMY; Location: [facility]; Surgeon: Hortensia Ma, MD   LEFT HEART CATH AND CORONARY ANGIOGRAPHY Left 06/09/2020   Procedure: LEFT HEART CATH AND CORONARY ANGIOGRAPHY;  Surgeon: Antonette Batters, MD;  Location: [facility];  Service: Cardiovascular;  Laterality: Left;   LEFT HEART CATH AND CORS/GRAFTS ANGIOGRAPHY Left 11/20/2016   Procedure: Left Heart Cath and Cors/Grafts Angiography;  Surgeon: Antonette Batters, MD;  Location: [facility];  Service: Cardiovascular;  Laterality: Left;   POLYPECTOMY  09/22/2020   Procedure: POLYPECTOMY;  Surgeon: Brice Campi Albino Alu., MD;  Location: [facility] ENDOSCOPY;  Service: Gastroenterology;;   POLYPECTOMY  10/19/2023   Procedure: POLYPECTOMY;  Surgeon: Marnee Sink, MD;  Location: [facility] ENDOSCOPY;  Service: Endoscopy;;   SUBMUCOSAL LIFTING INJECTION  09/22/2020   Procedure: SUBMUCOSAL LIFTING INJECTION;  Surgeon: Normie Becton., MD;  Location: [facility] ENDOSCOPY;  Service: Gastroenterology;;  UMBILICAL HERNIA REPAIR N/A 02/19/2023   Procedure: HERNIA REPAIR UMBILICAL ADULT, open;  Surgeon: Emmalene Hare, MD;  Location: [facility] ORS;  Service: General;  Laterality: N/A;   XI ROBOTIC ASSISTED INGUINAL HERNIA REPAIR WITH MESH Right 02/19/2023   Procedure: XI ROBOTIC ASSISTED INGUINAL HERNIA REPAIR WITH MESH;  Surgeon: Emmalene Hare, MD;  Location: [facility] ORS;  Service: General;  Laterality: Right;   Social History   Socioeconomic History   Marital status: Married    Spouse name: Not on file   Number of children: Not on file   Years of education: Not on file   Highest education level: Not on file  Occupational History   Not on file  Tobacco Use   Smoking status: Former    Current packs/day: 0.00    Types: Cigarettes    Quit date: 2000    Years since quitting: 25.2    Passive exposure:  Never   Smokeless tobacco: Never  Vaping Use   Vaping status: Never Used  Substance and Sexual Activity   Alcohol use: No   Drug use: No   Sexual activity: Not Currently  Other Topics Concern   Not on file  Social History Narrative   Not on file   Social Drivers of Health   Financial Resource Strain: Not on file  Food Insecurity: No Food Insecurity (10/16/2023)   Hunger Vital Sign    Worried About Running Out of Food in the Last Year: Never true    Ran Out of Food in the Last Year: Never true  Transportation Needs: No Transportation Needs (10/16/2023)   PRAPARE - Administrator, Civil Service (Medical): No    Lack of Transportation (Non-Medical): No  Physical Activity: Not on file  Stress: Not on file  Social Connections: Moderately Isolated (10/17/2023)   Social Connection and Isolation Panel [NHANES]    Frequency of Communication with Friends and Family: More than three times a week    Frequency of Social Gatherings with Friends and Family: Three times a week    Attends Religious Services: Never    Active Member of Clubs or Organizations: No    Attends Banker Meetings: Never    Marital Status: Married  Catering manager Violence: Not At Risk (10/16/2023)   Humiliation, Afraid, Rape, and Kick questionnaire    Fear of Current or Ex-Partner: No    Emotionally Abused: No    Physically Abused: No    Sexually Abused: No   Family History  Problem Relation Age of Onset   Hyperlipidemia Mother    Heart disease Mother    Hypertension Father    Current Outpatient Medications:    acetaminophen  (TYLENOL ) 325 MG tablet, Take 2 tablets (650 mg total) by mouth every 6 (six) hours as needed for mild pain (pain score 1-3), fever or headache (or Fever >/= 101)., Disp: , Rfl:    cyanocobalamin  (VITAMIN B12) 1000 MCG tablet, Take 1,000 mcg by mouth daily., Disp: , Rfl:    dexlansoprazole (DEXILANT) 60 MG capsule, Take 1 capsule by mouth daily., Disp: , Rfl:     empagliflozin  (JARDIANCE ) 10 MG TABS tablet, Take 1 tablet (10 mg total) by mouth daily before breakfast., Disp: 30 tablet, Rfl: 0   Ferrous Sulfate (IRON ) 325 (65 Fe) MG TABS, Take 1 tablet by mouth daily., Disp: , Rfl:    labetalol  (NORMODYNE ) 100 MG tablet, Take 0.5 tablets (50 mg total) by mouth 2 (two) times daily., Disp: , Rfl:    lactulose  (  CHRONULAC ) 10 GM/15ML solution, Taking 15-20 cc by mouth 3 to 4 times a day for constiption, Disp: 946 mL, Rfl: 2   nitroGLYCERIN  (NITROSTAT ) 0.4 MG SL tablet, Place 0.4 mg under the tongue every 5 (five) minutes as needed., Disp: , Rfl:    rosuvastatin  (CRESTOR ) 40 MG tablet, Take 40 mg by mouth daily., Disp: , Rfl:    sitaGLIPtin (JANUVIA) 100 MG tablet, Take 100 mg by mouth daily., Disp: , Rfl:    Testosterone  1.62 % GEL, Apply 2 Pump topically daily. One pump on each arm, Disp: 225 g, Rfl: 3   torsemide  (DEMADEX ) 20 MG tablet, Take 2 tablets (40 mg total) by mouth daily., Disp: 60 tablet, Rfl: 0   [Paused] amLODipine  (NORVASC ) 5 MG tablet, Take 5 mg by mouth 2 (two) times daily. (Patient not taking: Reported on 10/24/2023), Disp: , Rfl:    [Paused] hydrALAZINE  (APRESOLINE ) 50 MG tablet, Take 1 tablet by mouth 3 (three) times daily. (Patient not taking: Reported on 10/24/2023), Disp: , Rfl:   Physical exam:  Vitals:   10/24/23 1354  BP: (!) 158/67  Pulse: 60  Resp: 16  Temp: (!) 97.1 F (36.2 C)  TempSrc: Tympanic  SpO2: 95%  Weight: 208 lb (94.3 kg)    Physical Exam Constitutional:      Appearance: He is not ill-appearing.  Eyes:     General: No scleral icterus. Cardiovascular:     Rate and Rhythm: Normal rate and regular rhythm.  Pulmonary:     Effort: Pulmonary effort is normal. No respiratory distress.  Abdominal:     General: There is distension.     Tenderness: There is no abdominal tenderness.  Skin:    General: Skin is warm and dry.     Coloration: Skin is pale.     Findings: Bruising present.  Neurological:     Mental  Status: He is alert and oriented to person, place, and time.  Psychiatric:        Mood and Affect: Mood normal.        Behavior: Behavior normal.       Latest Ref Rng & Units 10/24/2023    1:31 PM  CMP  Glucose 70 - 99 mg/dL 433   BUN 8 - 23 mg/dL 32   Creatinine 2.95 - 1.24 mg/dL 1.88   Sodium 416 - 606 mmol/L 140   Potassium 3.5 - 5.1 mmol/L 4.0   Chloride 98 - 111 mmol/L 103   CO2 22 - 32 mmol/L 24   Calcium  8.9 - 10.3 mg/dL 9.4   Total Protein 6.5 - 8.1 g/dL 6.6   Total Bilirubin 0.0 - 1.2 mg/dL 0.8   Alkaline Phos 38 - 126 U/L 109   AST 15 - 41 U/L 48   ALT 0 - 44 U/L 49       Latest Ref Rng & Units 10/24/2023    1:31 PM  CBC  WBC 4.0 - 10.5 K/uL 5.5   Hemoglobin 13.0 - 17.0 g/dL 7.7   Hematocrit 30.1 - 52.0 % 26.0   Platelets 150 - 400 K/uL 100    Iron /TIBC/Ferritin/ %Sat    Component Value Date/Time   IRON  58 10/24/2023 1331   IRON  54 11/01/2020 1505   TIBC 330 10/24/2023 1331   TIBC 341 11/01/2020 1505   FERRITIN 57 10/24/2023 1331   IRONPCTSAT 18 10/24/2023 1331   IRONPCTSAT 16 11/01/2020 1505    Assessment and plan- Patient is a 78 y.o. male   Acute on  Chronic Anemia- baseline hemoglobin around 11. More recently dropped to 9.9 which was thought to be secondary to inguinal hernia surgery. His ferritin was 116 and iron  sat 27% at that time inconsistent with iron  deficiency. 08/2023- hospitalized for dark stools and hmg 8. Received pRBCs & venofer . October 16, 2023-ER for SOB & CP. Hmg 6. Black stools. S/p colonoscopy and endoscopy 10/19/23. Hmg today 7.7. Down from hospital. Concern for ongoing bleeding. Recommend venofer  today and weekly. Plan to recheck counts weekly. Goal ferritin ~ 100.  CKD- baseline around 1.8. During hospitalization Cr 3.64. Now improving to 2.85.  HFpEF & Exertional shortness of breath- likely multifactorial including pulmonary hypertension, chf, severe ckd, pleural effusion, anemia. Improving. Advised him to follow up with cardiology  pAfib-  on eliquis - currently held due to active GI bleed.  Thrombocytopenia- plt 100. Monitor.   Disposition: 1 week- lab (cbc, ferritin, iron  studies, hold tube), Dr Randy Buttery, venofer - [place]    Visit Diagnosis 1. Iron  deficiency anemia due to chronic blood loss    Kenney Peacemaker, DNP, AGNP-C, [facility] [phone] (clinic) 10/24/2023

## 2023-10-28 ENCOUNTER — Telehealth: Payer: Self-pay

## 2023-10-28 ENCOUNTER — Inpatient Hospital Stay (HOSPITAL_BASED_OUTPATIENT_CLINIC_OR_DEPARTMENT_OTHER): Admitting: Hospice and Palliative Medicine

## 2023-10-28 ENCOUNTER — Inpatient Hospital Stay

## 2023-10-28 ENCOUNTER — Ambulatory Visit
Admission: RE | Admit: 2023-10-28 | Discharge: 2023-10-28 | Disposition: A | Discharge status: Home / Self Care | Payer: Medicare HMO | Source: Ambulatory Visit | Attending: Gastroenterology | Admitting: Gastroenterology

## 2023-10-28 ENCOUNTER — Ambulatory Visit
Admission: RE | Admit: 2023-10-28 | Discharge: 2023-10-28 | Disposition: A | Discharge status: Home / Self Care | Source: Home / Self Care | Attending: Hospice and Palliative Medicine | Admitting: Hospice and Palliative Medicine

## 2023-10-28 ENCOUNTER — Ambulatory Visit
Admission: RE | Admit: 2023-10-28 | Discharge: 2023-10-28 | Disposition: A | Discharge status: Home / Self Care | Source: Ambulatory Visit | Attending: Hospice and Palliative Medicine

## 2023-10-28 VITALS — BP 172/91 | HR 72 | Temp 98.3°F | Resp 17

## 2023-10-28 DIAGNOSIS — D508 Other iron deficiency anemias: Secondary | ICD-10-CM | POA: Diagnosis not present

## 2023-10-28 DIAGNOSIS — D509 Iron deficiency anemia, unspecified: Secondary | ICD-10-CM | POA: Diagnosis not present

## 2023-10-28 DIAGNOSIS — D649 Anemia, unspecified: Secondary | ICD-10-CM

## 2023-10-28 DIAGNOSIS — K7460 Unspecified cirrhosis of liver: Secondary | ICD-10-CM | POA: Diagnosis present

## 2023-10-28 LAB — CBC
HCT: 26.9 % — ABNORMAL LOW (ref 39.0–52.0)
Hemoglobin: 8.1 g/dL — ABNORMAL LOW (ref 13.0–17.0)
MCH: 30.3 pg (ref 26.0–34.0)
MCHC: 30.1 g/dL (ref 30.0–36.0)
MCV: 100.7 fL — ABNORMAL HIGH (ref 80.0–100.0)
Platelets: 144 10*3/uL — ABNORMAL LOW (ref 150–400)
RBC: 2.67 MIL/uL — ABNORMAL LOW (ref 4.22–5.81)
RDW: 15.6 % — ABNORMAL HIGH (ref 11.5–15.5)
WBC: 5.9 10*3/uL (ref 4.0–10.5)
nRBC: 0 % (ref 0.0–0.2)

## 2023-10-28 LAB — SAMPLE TO BLOOD BANK

## 2023-10-28 NOTE — Progress Notes (Signed)
 Symptom Management Clinic [facility] at [facility] [phone] [phone]  Patient Care Team: Jaclyn Shaggy, MD as PCP - General (Internal Medicine) Karie Soda, MD as Consulting Physician (General Surgery) Pasty Spillers, MD (Inactive) as Consulting Physician (Gastroenterology) Alwyn Pea, MD as Consulting Physician (Cardiology) Creig Hines, MD as Consulting Physician (Oncology)   NAME OF PATIENT: [name]  [id number]  Apr 02, 1946   DATE OF VISIT: 10/28/23  REASON FOR CONSULT: [name] is a 78 y.o. male with multiple medical problems including CAD status post CABG x 3 and DES stenting, history of CHF, paroxysmal A-fib on Eliquis, CKD, severe pulmonary hypertension, cirrhosis, history of upper GI bleed.  INTERVAL HISTORY: Patient was hospitalized 10/16/2023 to 10/19/2023 with exertional dyspnea.  Was found to have anemia and GI bleed.  Was also hospitalized 08/22/2023 to 08/29/2023 with shortness of breath secondary to CHF also with upper GI bleeding and acute blood loss anemia  Patient underwent colonoscopy and upper endoscopy on 10/19/2023.  Patient found to have 2 polyps in the transverse colon.  Also found to have multiple polyps in the stomach without evidence of bleeding or stigmata of recent bleeding.  Of note, he was recently taken off blood thinners given GI bleed.  Patient walked into the cancer center today requesting to be seen due to shortness of breath.  Patient states that he has exertional dyspnea when he attempts to ambulate up steps.  He says that he has found himself to be hypoxic when he checks his home O2 but that it quickly normalizes with rest.  He is scheduled to see his cardiologist tomorrow by Kateri Mc.  Patient denies chest pain or shortness of breath at the time of my visit.  He denies cough or congestion.  No fever or chills. No dark stools or bleeding.   Denies any neurologic complaints.  Denies recent fevers or illnesses. Denies any easy bleeding or bruising. Reports fair appetite and denies weight loss. Denies chest pain. Denies any nausea, vomiting, constipation, or diarrhea. Denies urinary complaints. Patient offers no further specific complaints today.   PAST MEDICAL HISTORY: Past Medical History:  Diagnosis Date   (HFpEF) heart failure with preserved ejection fraction (HCC) 07/25/2016   a.)TTE 07/25/16: EF >55, triv-mild pan regur, mild AS, G2DD; b.)TTE 09/02/16: EF >55, triv TR, mild AS; c.)TTE 11/05/16: EF >55, LAE, triv-mild pan regur, mild AS, G2DD; d.)TTE 01/21/20: EF >55, mod LVH, LAE/RVE, triv-mild AR/PR/TR, mod MR, mild AS, G2DD; e.)TTE 09/24/22: EF>55, sev LVH, sev LAE, RAE, mild PR, mod AR/MR/TR, mild-mod AS, RVSP 74.7; f.)TTE 12/23/22: EF 60-65, LAE, mild-mod MR/TR, mild AS   Abnormal urine 05/01/2023   Anemia    Aortic atherosclerosis (HCC)    Aortic stenosis 07/25/2016   a.) TTE 07/25/2016: mild (MPG 13.5); b.) TTE 09/02/2016: mild (MPG 16); c.) TTE 11/05/2016: mild (MPG 9); d.) TTE 01/21/2020: mild (MPG 11.7); e.) TTE 09/24/2022: mild-mod (MPG 20.3); f.) TTE 12/23/2022: mild (MPG 15)   Atrial fibrillation (HCC)    a.) CHA2DS2VASc = 6 (age x2, CHF, HTN, vascular disease history, T2DM);  b.) rate/rhythm maintained on oral labetalol; chronically anticoagulated with apixaban + clopidogrel   AV block, 1st degree    B12 deficiency    CKD (chronic kidney disease), stage III (HCC)    Colon polyps    Congestive heart failure (HCC) 05/01/2023   Coronary artery disease 08/16/2016   a.) s/p 3v CABG 08/30/2016; b.) s/p PCI 11/26/2016 (DES x 1 oSVG-RCA);  c.) s/p PCI 12/17/2016 (DES x 5 --> mLCx x2, dLCx, pLAD, dLAD); c.) s/p PCI 06/09/2020 (DES x1 --> ISR oSVG-PDA)   Cyst of kidney, acquired 07/03/2023   Dyspnea    Gastroesophageal reflux disease 05/01/2023   GERD (gastroesophageal reflux disease)    Heart murmur    Hepatic steatosis    History of 2019 novel coronavirus  disease (COVID-19) 09/02/2020   a.) Tx'd with remdesivir   History of blood transfusion    History of GI bleed 09/02/2020   a.) admitted to [facility] 09/02/2020 - 09/08/2020   History of heart artery stent 11/26/2016   TOTAL of 7 stents: a.) 11/26/2016 --> 3.5 x 22 mm Resolute Integrity oSVG-RCA; b.) 12/17/2016: 3.0 x 12 mm Xience Alpine dLCx, 3.5 x 22 mm Resolute Onyx mLCx, 3.5 x 18 mm Resolute Onyx mLCx, 3.0 x 26 mm Resolute Onyx pLAD, 2.0 x 26 mm Resolute Onyx dLAD; c.) 06/09/2020 --> 2.5 x 18 mm Resolute Onyx ISR oSVG-PDA   History of partial colectomy 08/12/2012   a.) large gastric hyperplastic polyp removal   HLD (hyperlipidemia)    Hypertension    LBBB (left bundle branch block)    Long term current use of anticoagulant    a.) apixaban   Long term current use of antithrombotics/antiplatelets    a.) clopidogrel   Male hypogonadism    a.) on exogenous TRT (1.62% gel)   Mild cardiomegaly    Mild gynecomastia (bilateral)    Nephrolithiasis    OSA (obstructive sleep apnea)    a.) does not utilize nocturnal PAP therapy   Personal history of surgery to heart and great vessels, presenting hazards to health 05/01/2023   Proteinuria, unspecified 08/01/2023   Pulmonary hypertension (HCC) 09/24/2022   a.) TTE 09/24/2022: RVSP 74.7; b.) TTE 12/23/2022: RVSP 31.3   RBBB (right bundle branch block with left anterior fascicular block)    Right inguinal hernia    Right thyroid nodule    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   a.) LIMA-LAD, SVG-PDA, SVG-OM3   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/01/2023   Type 2 diabetes mellitus treated with insulin (HCC)    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   Umbilical hernia    Unstable angina (HCC)    Vitamin D deficiency     PAST SURGICAL HISTORY:  Past Surgical History:  Procedure Laterality Date   APPENDECTOMY     CARDIAC CATHETERIZATION Left 08/16/2016   Procedure: Left Heart Cath and Coronary Angiography;   Surgeon: Alwyn Pea, MD;  Location: [facility] INVASIVE CV LAB;  Service: Cardiovascular;  Laterality: Left;   COLONOSCOPY N/A 09/07/2020   Procedure: COLONOSCOPY;  Surgeon: Pasty Spillers, MD;  Location: [facility] ENDOSCOPY;  Service: Endoscopy;  Laterality: N/A;   COLONOSCOPY N/A 10/19/2023   Procedure: COLONOSCOPY;  Surgeon: Midge Minium, MD;  Location[facility] ENDOSCOPY;  Service: Endoscopy;  Laterality: N/A;   COLONOSCOPY WITH PROPOFOL N/A 05/17/2021   Procedure: COLONOSCOPY WITH PROPOFOL;  Surgeon: Pasty Spillers, MD;  Location: [facility] ENDOSCOPY;  Service: Endoscopy;  Laterality: N/A;   CORONARY ANGIOPLASTY WITH STENT PLACEMENT Left 11/26/2016   Procedure: CORONARY ANGIOPLASTY WITH STENT PLACEMENT; Location: [facility]; Surgeon: Jerrell Mylar, MD   CORONARY ARTERY BYPASS GRAFT N/A 08/30/2016   Procedure: CORONARY ARTERY BYPASS GRAFT; Location: [facility]; Surgeon: Nance Pew, MD   CORONARY STENT INTERVENTION N/A 06/09/2020   Procedure: CORONARY STENT INTERVENTION;  Surgeon: Alwyn Pea, MD;  Location: [facility]VAS[facility] CV [facility];  Service: Cardiovascular;  Laterality: N/A;   CORONARY STENT INTERVENTION Left 12/17/2016   Procedure: CORONARY STENT INTERVENTION; Location: [facility]; Surgeon: Jerrell Mylar, MD   ENDOSCOPIC MUCOSAL RESECTION N/A 09/22/2020   Procedure: ENDOSCOPIC MUCOSAL RESECTION;  Surgeon: Lemar Lofty., MD;  Location: [facility] ENDOSCOPY;  Service: Gastroenterology;  Laterality: N/A;   ESOPHAGOGASTRODUODENOSCOPY N/A 09/07/2020   Procedure: ESOPHAGOGASTRODUODENOSCOPY (EGD);  Surgeon: Pasty Spillers, MD;  Location: [facility] ENDOSCOPY;  Service: Endoscopy;  Laterality: N/A;   ESOPHAGOGASTRODUODENOSCOPY N/A 10/19/2023   Procedure: EGD (ESOPHAGOGASTRODUODENOSCOPY);  Surgeon: Midge Minium, MD;  Location: [facility] ENDOSCOPY;  Service: Endoscopy;  Laterality: N/A;   ESOPHAGOGASTRODUODENOSCOPY (EGD) WITH PROPOFOL N/A 09/22/2020   Procedure: ESOPHAGOGASTRODUODENOSCOPY (EGD) WITH PROPOFOL;  Surgeon:  Meridee Score Netty Starring., MD;  Location: [facility] ENDOSCOPY;  Service: Gastroenterology;  Laterality: N/A;   ESOPHAGOGASTRODUODENOSCOPY (EGD) WITH PROPOFOL N/A 08/26/2023   Procedure: ESOPHAGOGASTRODUODENOSCOPY (EGD) WITH PROPOFOL;  Surgeon: Midge Minium, MD;  Location: [facility] ENDOSCOPY;  Service: Endoscopy;  Laterality: N/A;   FRACTURE SURGERY     HEMOSTASIS CLIP PLACEMENT  09/22/2020   Procedure: HEMOSTASIS CLIP PLACEMENT;  Surgeon: Meridee Score Netty Starring., MD;  Location: [facility] ENDOSCOPY;  Service: Gastroenterology;;   HEMOSTASIS CONTROL  09/22/2020   Procedure: HEMOSTASIS CONTROL;  Surgeon: Lemar Lofty., MD;  Location: [facility] ENDOSCOPY;  Service: Gastroenterology;;   INSERTION OF MESH  02/19/2023   Procedure: INSERTION OF MESH;  Surgeon: Henrene Dodge, MD;  Location: [facility] ORS;  Service: General;;  umbilical   LAPAROSCOPIC PARTIAL RIGHT COLECTOMY Right 08/12/2012   Procedure: LAPAROSCOPIC PARTIAL RIGHT COLECTOMY; Location: [facility]; Surgeon: Renda Rolls, MD   LEFT HEART CATH AND CORONARY ANGIOGRAPHY Left 06/09/2020   Procedure: LEFT HEART CATH AND CORONARY ANGIOGRAPHY;  Surgeon: Alwyn Pea, MD;  Location: [facility] CV [facility];  Service: Cardiovascular;  Laterality: Left;   LEFT HEART CATH AND CORS/GRAFTS ANGIOGRAPHY Left 11/20/2016   Procedure: Left Heart Cath and Cors/Grafts Angiography;  Surgeon: Alwyn Pea, MD;  Location: [facility] CV [facility];  Service: Cardiovascular;  Laterality: Left;   POLYPECTOMY  09/22/2020   Procedure: POLYPECTOMY;  Surgeon: Meridee Score Netty Starring., MD;  Location: [facility] ENDOSCOPY;  Service: Gastroenterology;;   POLYPECTOMY  10/19/2023   Procedure: POLYPECTOMY;  Surgeon: Midge Minium, MD;  Location: [facility] ENDOSCOPY;  Service: Endoscopy;;   SUBMUCOSAL LIFTING INJECTION  09/22/2020   Procedure: SUBMUCOSAL LIFTING INJECTION;  Surgeon: Lemar Lofty., MD;  Location: [facility] ENDOSCOPY;  Service: Gastroenterology;;   UMBILICAL HERNIA REPAIR N/A 02/19/2023   Procedure:  HERNIA REPAIR UMBILICAL ADULT, open;  Surgeon: Henrene Dodge, MD;  Location: [facility] ORS;  Service: General;  Laterality: N/A;   XI ROBOTIC ASSISTED INGUINAL HERNIA REPAIR WITH MESH Right 02/19/2023   Procedure: XI ROBOTIC ASSISTED INGUINAL HERNIA REPAIR WITH MESH;  Surgeon: Henrene Dodge, MD;  Location: [facility] ORS;  Service: General;  Laterality: Right;    HEMATOLOGY/ONCOLOGY HISTORY:  Oncology History   No history exists.    ALLERGIES:  is allergic to isosorbide.  MEDICATIONS:  Current Outpatient Medications  Medication Sig Dispense Refill   acetaminophen (TYLENOL) 325 MG tablet Take 2 tablets (650 mg total) by mouth every 6 (six) hours as needed for mild pain (pain score 1-3), fever or headache (or Fever >/= 101).     cyanocobalamin (VITAMIN B12) 1000 MCG tablet Take 1,000 mcg by mouth daily.     dexlansoprazole (DEXILANT) 60 MG capsule Take 1 capsule by mouth daily.     empagliflozin (JARDIANCE) 10 MG TABS tablet Take 1 tablet (10 mg total) by mouth daily before breakfast. 30 tablet  0   Ferrous Sulfate (IRON) 325 (65 Fe) MG TABS Take 1 tablet by mouth daily.     labetalol (NORMODYNE) 100 MG tablet Take 0.5 tablets (50 mg total) by mouth 2 (two) times daily.     lactulose (CHRONULAC) 10 GM/15ML solution Taking 15-20 cc by mouth 3 to 4 times a day for constiption 946 mL 2   nitroGLYCERIN (NITROSTAT) 0.4 MG SL tablet Place 0.4 mg under the tongue every 5 (five) minutes as needed.     rosuvastatin (CRESTOR) 40 MG tablet Take 40 mg by mouth daily.     sitaGLIPtin (JANUVIA) 100 MG tablet Take 100 mg by mouth daily.     Testosterone 1.62 % GEL Apply 2 Pump topically daily. One pump on each arm 225 g 3   torsemide (DEMADEX) 20 MG tablet Take 2 tablets (40 mg total) by mouth daily. 60 tablet 0   [Paused] amLODipine (NORVASC) 5 MG tablet Take 5 mg by mouth 2 (two) times daily. (Patient not taking: Reported on 10/28/2023)     [Paused] hydrALAZINE (APRESOLINE) 50 MG tablet Take 1 tablet by mouth 3  (three) times daily. (Patient not taking: Reported on 10/24/2023)     No current facility-administered medications for this visit.    VITAL SIGNS: BP (!) 172/91 (Patient Position: Sitting)   Pulse 72   Temp 98.3 F (36.8 C) (Oral)   Resp 17   SpO2 92%  There were no vitals filed for this visit.  Estimated body mass index is 27.44 kg/m as calculated from the following:   Height as of 10/16/23: 6\' 1"  (1.854 m).   Weight as of 10/24/23: 208 lb (94.3 kg).  LABS: CBC:    Component Value Date/Time   WBC 5.9 10/28/2023 1038   HGB 8.1 (L) 10/28/2023 1038   HGB 11.3 (L) 11/01/2020 1505   HCT 26.9 (L) 10/28/2023 1038   HCT 36.4 (L) 11/01/2020 1505   PLT 144 (L) 10/28/2023 1038   PLT 158 11/01/2020 1505   MCV 100.7 (H) 10/28/2023 1038   MCV 85 11/01/2020 1505   NEUTROABS 4.2 04/23/2023 1040   LYMPHSABS 0.5 (L) 04/23/2023 1040   MONOABS 0.4 04/23/2023 1040   EOSABS 0.1 04/23/2023 1040   BASOSABS 0.1 04/23/2023 1040   Comprehensive Metabolic Panel:    Component Value Date/Time   NA 140 10/24/2023 1331   NA 139 08/04/2012 1103   K 4.0 10/24/2023 1331   K 4.1 08/04/2012 1103   CL 103 10/24/2023 1331   CL 105 08/04/2012 1103   CO2 24 10/24/2023 1331   CO2 27 08/04/2012 1103   BUN 32 (H) 10/24/2023 1331   BUN 17 08/04/2012 1103   CREATININE 2.85 (H) 10/24/2023 1331   CREATININE 1.03 08/04/2012 1103   GLUCOSE 131 (H) 10/24/2023 1331   GLUCOSE 126 (H) 08/04/2012 1103   CALCIUM 9.4 10/24/2023 1331   CALCIUM 10.8 (H) 09/03/2020 0857   AST 48 (H) 10/24/2023 1331   AST 24 08/04/2012 1103   ALT 49 (H) 10/24/2023 1331   ALT 33 08/04/2012 1103   ALKPHOS 109 10/24/2023 1331   ALKPHOS 117 08/04/2012 1103   BILITOT 0.8 10/24/2023 1331   BILITOT 0.4 08/01/2023 1304   BILITOT 0.3 08/04/2012 1103   PROT 6.6 10/24/2023 1331   PROT 6.9 08/01/2023 1304   PROT 7.6 08/04/2012 1103   ALBUMIN 3.3 (L) 10/24/2023 1331   ALBUMIN 4.3 08/01/2023 1304   ALBUMIN 4.1 08/04/2012 1103     RADIOGRAPHIC STUDIES: DG Chest [facility]  1 View Result Date: 10/17/2023 CLINICAL DATA:  Follow up pleural effusion following right-sided thoracentesis. EXAM: PORTABLE CHEST 1 VIEW COMPARISON:  Radiographs 10/16/2023 and 08/23/2023.  CT 08/20/2023. FINDINGS: 1015 hours. The heart size and mediastinal contours are stable post median sternotomy and CABG. Right pleural effusion has mildly decreased in volume. Small left pleural effusion and bibasilar pulmonary opacities are unchanged. No evidence of pneumothorax. The bones appear unremarkable. IMPRESSION: Mild decrease in right pleural effusion following thoracentesis. No evidence of pneumothorax. Electronically Signed   By: Carey Bullocks M.D.   On: 10/17/2023 11:30   US THORACENTESIS ASP PLEURAL SPACE W/IMG GUIDE Result Date: 10/17/2023 INDICATION: Shortness of breath. Large right pleural effusion. Request for therapeutic thoracentesis. EXAM: ULTRASOUND GUIDED RIGHT THORACENTESIS MEDICATIONS: 1% plain lidocaine, 5 mL COMPLICATIONS: None immediate. PROCEDURE: An ultrasound guided thoracentesis was thoroughly discussed with the patient and questions answered. The benefits, risks, alternatives and complications were also discussed. The patient understands and wishes to proceed with the procedure. Written consent was obtained. Ultrasound was performed to localize and mark an adequate pocket of fluid in the right chest. The area was then prepped and draped in the normal sterile fashion. 1% Lidocaine was used for local anesthesia. Under ultrasound guidance a 6 Fr Safe-T-Centesis catheter was introduced. Thoracentesis was performed. The catheter was removed and a dressing applied. FINDINGS: A total of approximately 2.2 L of clear, dark yellow fluid was removed. IMPRESSION: Successful ultrasound guided right thoracentesis yielding 2.2 L of pleural fluid. Procedure performed by Brayton El PA-C and supervised by Dr. Irish Lack Electronically Signed   By: Irish Lack M.D.   On: 10/17/2023 11:00   DG Chest 2 View Result Date: 10/16/2023 CLINICAL DATA:  Worsening chest pain and shortness of breath over the past few days, with exertion. Bilateral lower extremity swelling. Clinically diagnosed CHF. EXAM: CHEST - 2 VIEW COMPARISON:  08/23/2023 FINDINGS: The cardiac silhouette remains mildly enlarged. No significant change in small left pleural effusion and moderate right pleural effusion. Mildly improved bibasilar atelectasis and possible left lower lobe pneumonia. Stable post CABG changes. Tortuous and partially calcified thoracic aorta. Stable sternal fixation hardware. Interval probable zipper overlying the thoracic inlet. IMPRESSION: 1. Mildly improved bibasilar atelectasis and possible left lower lobe pneumonia. 2. No significant change in small left pleural effusion and moderate right pleural effusion. 3. Mild cardiomegaly. 4. Probable zipper overlying the thoracic inlet. Correlation with overlying clothing is recommended to help exclude an ingested foreign body. Electronically Signed   By: Beckie Salts M.D.   On: 10/16/2023 17:01    PERFORMANCE STATUS (ECOG) : 2 - Symptomatic, <50% confined to bed  Review of Systems Unless otherwise noted, a complete review of systems is negative.  Physical Exam General: NAD Cardiovascular: regular rate and rhythm Pulmonary: clear anterior/posterior fields Abdomen: soft, nontender, + bowel sounds GU: no suprapubic tenderness Extremities: no edema, no joint deformities Skin: no rashes Neurological: Weakness but otherwise nonfocal  IMPRESSION/PLAN: IDA/anemia of chronic dz-hemoglobin improved to 8.1 after receiving iron last week.  Discussed with Dr. Smith Robert who would like to proceed with weekly iron x 4 until ferritin is around 100.  Exertional shortness of breath -unclear etiology but likely multifactorial.  Patient has multiple advanced comorbidities including pulmonary hypertension, history of CHF, severe CKD,  recent pleural effusion, any of which could be contributing to his existing exertional dyspnea.  Patient also recently taken off anticoagulation due to active GI bleed.  Patient is not tachycardic or hypoxic at the time of my  clinic visit, which lessens suspicion for PE.  Discussed with Dr. Smith Robert and will obtain chest x-ray.  Patient will also be referred to pulmonology.  Patient has a scheduled appointment with his cardiologist tomorrow.  ED triggers reviewed in detail with patient who was advised to present to the emergency department for any change or worsening of symptoms.  Case and plan discussed with Dr. Smith Robert.  Weekly Venofer x 4 and then follow-up CBC in 6 weeks.  Patient expressed understanding and was in agreement with this plan. He also understands that He can call clinic at any time with any questions, concerns, or complaints.   Thank you for allowing me to participate in the care of this very pleasant patient.   Time Total: 25 minutes  Visit consisted of counseling and education dealing with the complex and emotionally intense issues of symptom management in the setting of serious illness.Greater than 50%  of this time was spent counseling and coordinating care related to the above assessment and plan.  Signed by: Laurette Schimke, PhD, NP-C

## 2023-10-28 NOTE — Telephone Encounter (Signed)
 FYI--If patient returns call can you relay message.

## 2023-10-28 NOTE — Progress Notes (Signed)
 Patient walks in clinic today with concerns of weakness with dyspnea on exertion.  Received iron infusion last week but feeling worse today.

## 2023-10-28 NOTE — Telephone Encounter (Signed)
 Josh would like patient to know about chest x-ray results.  Sharia Reeve has spoken with Dr. Lucyle Alumbaugh Robert and plan is to refer to pulmonology (entered at visit with Josh this am).  If breathing symptoms worsen patient should proceed to ED.  IMPRESSION: No significant change in moderate right-greater-than-left pleural effusions with associated bibasilar atelectasis. No evidence of edema or pneumothorax.

## 2023-10-29 ENCOUNTER — Telehealth: Payer: Self-pay | Admitting: *Deleted

## 2023-10-29 ENCOUNTER — Encounter: Payer: Self-pay | Admitting: Oncology

## 2023-10-29 ENCOUNTER — Ambulatory Visit
Admission: RE | Admit: 2023-10-29 | Discharge: 2023-10-29 | Disposition: A | Discharge status: Home / Self Care | Source: Ambulatory Visit | Attending: Cardiovascular Disease | Admitting: Cardiovascular Disease

## 2023-10-29 DIAGNOSIS — I5032 Chronic diastolic (congestive) heart failure: Secondary | ICD-10-CM | POA: Diagnosis present

## 2023-10-29 MED ORDER — FUROSEMIDE 10 MG/ML IJ SOLN
INTRAMUSCULAR | Status: AC
  Filled 2023-10-29: qty 8

## 2023-10-29 MED ORDER — FUROSEMIDE 10 MG/ML IJ SOLN
80.0000 mg | Freq: Once | INTRAMUSCULAR | Status: AC
  Administered 2023-10-29: 80 mg via INTRAVENOUS

## 2023-10-29 NOTE — Telephone Encounter (Signed)
 Unsuccessful attempt to contact patient.  Message with recommendations left on confidential voicemail.

## 2023-10-29 NOTE — Telephone Encounter (Signed)
 When I called the pt back he new everything and he is going to call the pulmonary and get appt. Today he is going to the cardiac MD

## 2023-10-30 ENCOUNTER — Inpatient Hospital Stay

## 2023-10-30 VITALS — BP 146/60 | HR 61 | Temp 97.0°F | Resp 17

## 2023-10-30 DIAGNOSIS — D508 Other iron deficiency anemias: Secondary | ICD-10-CM

## 2023-10-30 DIAGNOSIS — D509 Iron deficiency anemia, unspecified: Secondary | ICD-10-CM | POA: Diagnosis not present

## 2023-10-30 MED ORDER — SODIUM CHLORIDE 0.9% FLUSH
10.0000 mL | Freq: Once | INTRAVENOUS | Status: AC
  Administered 2023-10-30: 10 mL via INTRAVENOUS
  Filled 2023-10-30: qty 10

## 2023-10-30 MED ORDER — IRON SUCROSE 20 MG/ML IV SOLN
200.0000 mg | INTRAVENOUS | Status: DC
  Administered 2023-10-30: 200 mg via INTRAVENOUS
  Filled 2023-10-30: qty 10

## 2023-10-30 NOTE — Progress Notes (Signed)
Patient tolerated Venofer infusion well. Explained recommendation of 30 min post monitoring. Patient refused to wait post monitoring. Educated on what signs to watch for & to call with any concerns. No questions, discharged. Stable  

## 2023-10-31 ENCOUNTER — Inpatient Hospital Stay

## 2023-11-06 ENCOUNTER — Inpatient Hospital Stay

## 2023-11-06 VITALS — BP 138/64 | HR 57 | Temp 97.6°F | Resp 18

## 2023-11-06 DIAGNOSIS — D509 Iron deficiency anemia, unspecified: Secondary | ICD-10-CM | POA: Diagnosis not present

## 2023-11-06 DIAGNOSIS — D508 Other iron deficiency anemias: Secondary | ICD-10-CM

## 2023-11-06 MED ORDER — IRON SUCROSE 20 MG/ML IV SOLN
200.0000 mg | INTRAVENOUS | Status: DC
  Administered 2023-11-06: 200 mg via INTRAVENOUS
  Filled 2023-11-06: qty 10

## 2023-11-06 NOTE — Patient Instructions (Signed)

## 2023-11-06 NOTE — Progress Notes (Signed)
 Pt. Educated on importance of staying for 30 minutes after Venofer infusion and directed to go to ER if any concerns for adverse reaction.

## 2023-11-12 ENCOUNTER — Telehealth: Payer: Self-pay | Admitting: Oncology

## 2023-11-12 DIAGNOSIS — R011 Cardiac murmur, unspecified: Secondary | ICD-10-CM | POA: Insufficient documentation

## 2023-11-12 DIAGNOSIS — I5032 Chronic diastolic (congestive) heart failure: Secondary | ICD-10-CM | POA: Insufficient documentation

## 2023-11-12 NOTE — Telephone Encounter (Signed)
 Patient called to confirm appointments for 4/28- gave appointment details

## 2023-11-13 ENCOUNTER — Inpatient Hospital Stay: Attending: Oncology

## 2023-11-13 VITALS — BP 157/71 | HR 63 | Temp 97.0°F | Resp 18

## 2023-11-13 DIAGNOSIS — D508 Other iron deficiency anemias: Secondary | ICD-10-CM

## 2023-11-13 DIAGNOSIS — D631 Anemia in chronic kidney disease: Secondary | ICD-10-CM | POA: Diagnosis not present

## 2023-11-13 DIAGNOSIS — N1832 Chronic kidney disease, stage 3b: Secondary | ICD-10-CM | POA: Diagnosis not present

## 2023-11-13 DIAGNOSIS — D509 Iron deficiency anemia, unspecified: Secondary | ICD-10-CM | POA: Insufficient documentation

## 2023-11-13 MED ORDER — IRON SUCROSE 20 MG/ML IV SOLN
200.0000 mg | INTRAVENOUS | Status: DC
  Administered 2023-11-13: 200 mg via INTRAVENOUS
  Filled 2023-11-13: qty 10

## 2023-11-13 NOTE — Patient Instructions (Signed)

## 2023-11-14 ENCOUNTER — Encounter: Admitting: Pulmonary Disease

## 2023-11-18 ENCOUNTER — Telehealth: Payer: Self-pay

## 2023-11-18 NOTE — Telephone Encounter (Signed)
-----   Message from Wyline Mood sent at 11/13/2023 10:59 AM EDT ----- Minimal ascites noted on usg

## 2023-11-18 NOTE — Telephone Encounter (Signed)
 Called patient but had to leave him a detailed message letting him know that he had minimal ascites and if he had further questions, to please call back.

## 2023-11-20 ENCOUNTER — Inpatient Hospital Stay

## 2023-11-20 VITALS — BP 151/67 | HR 54 | Temp 97.6°F | Resp 16

## 2023-11-20 DIAGNOSIS — D508 Other iron deficiency anemias: Secondary | ICD-10-CM

## 2023-11-20 DIAGNOSIS — D509 Iron deficiency anemia, unspecified: Secondary | ICD-10-CM | POA: Diagnosis not present

## 2023-11-20 MED ORDER — IRON SUCROSE 20 MG/ML IV SOLN
200.0000 mg | INTRAVENOUS | Status: DC
  Administered 2023-11-20: 200 mg via INTRAVENOUS
  Filled 2023-11-20: qty 10

## 2023-11-20 NOTE — Patient Instructions (Signed)

## 2023-11-20 NOTE — Progress Notes (Signed)
Refused 30 minute post observation. Aware of risks. Vitals stable at discharge.  

## 2023-12-04 ENCOUNTER — Ambulatory Visit: Payer: Medicare HMO | Admitting: Oncology

## 2023-12-04 ENCOUNTER — Other Ambulatory Visit: Payer: Medicare HMO

## 2023-12-06 ENCOUNTER — Encounter: Payer: Self-pay | Admitting: Oncology

## 2023-12-09 ENCOUNTER — Inpatient Hospital Stay: Admitting: Oncology

## 2023-12-09 ENCOUNTER — Encounter: Payer: Self-pay | Admitting: Oncology

## 2023-12-09 ENCOUNTER — Inpatient Hospital Stay

## 2023-12-09 DIAGNOSIS — D508 Other iron deficiency anemias: Secondary | ICD-10-CM

## 2023-12-09 DIAGNOSIS — D649 Anemia, unspecified: Secondary | ICD-10-CM

## 2023-12-09 DIAGNOSIS — D509 Iron deficiency anemia, unspecified: Secondary | ICD-10-CM | POA: Diagnosis not present

## 2023-12-09 LAB — CBC
HCT: 29.5 % — ABNORMAL LOW (ref 39.0–52.0)
Hemoglobin: 9.1 g/dL — ABNORMAL LOW (ref 13.0–17.0)
MCH: 30 pg (ref 26.0–34.0)
MCHC: 30.8 g/dL (ref 30.0–36.0)
MCV: 97.4 fL (ref 80.0–100.0)
Platelets: 101 10*3/uL — ABNORMAL LOW (ref 150–400)
RBC: 3.03 MIL/uL — ABNORMAL LOW (ref 4.22–5.81)
RDW: 16.5 % — ABNORMAL HIGH (ref 11.5–15.5)
WBC: 4.6 10*3/uL (ref 4.0–10.5)
nRBC: 0 % (ref 0.0–0.2)

## 2023-12-09 LAB — IRON AND TIBC
Iron: 50 ug/dL (ref 45–182)
Saturation Ratios: 17 % — ABNORMAL LOW (ref 17.9–39.5)
TIBC: 287 ug/dL (ref 250–450)
UIBC: 237 ug/dL

## 2023-12-09 LAB — FERRITIN: Ferritin: 135 ng/mL (ref 24–336)

## 2023-12-09 LAB — VITAMIN B12: Vitamin B-12: 863 pg/mL (ref 180–914)

## 2023-12-09 NOTE — Progress Notes (Signed)
 Hematology/Oncology Consult note [facility]  [phone] [phone]  Patient Care Team: Westley Hammers, MD as PCP - General (Internal Medicine) Candyce Champagne, MD as Consulting Physician (General Surgery) Irby Mannan, MD (Inactive) as Consulting Physician (Gastroenterology) Antonette Batters, MD as Consulting Physician (Cardiology) Avonne Boettcher, MD as Consulting Physician (Oncology)   Name of the patient: [name]  [id number]  02-11-1946   Date of visit: 12/09/23  Diagnosis- anemia multifactorial secondary to chronic disease as well as iron  deficiency    Chief complaint/ Reason for visit-routine follow-up of anemia  Heme/Onc history:  Patient is a 78 year old male with history of multiple gastric polyps who was seen by both Dr. Brice Campi and Dr. Tully Gainer.  He has undergone EGD with Dr. Brice Campi and had a large gastric hyperplastic polyp removal.  Prior to that patient had significant iron  deficiency and his aspirin  and Plavix  were on hold.  These were restarted after EGD.  Most recent CBC on 11/01/2020 showed white count of 6.8, H&H of 11.3/36.4 with an MCV of 85 and a platelet count of 158.  Iron  studies were normal.  Patient denies any bleeding in his stool or dark melanotic stools.   Patient has been requiring IV iron  intermittently.  He has not required any EPO so far.  Interval history-patient currently reports ongoing fatigue and bilateral lower extremity edema.He is also following up with cardiology and had to undergo right-sided thoracentesis secondary to heart failure related pleural effusion.  He feels more short of breath today but has an appointment coming up with cardiology tomorrow.  ECOG PS- 3 Pain scale- 2  Review of systems- Review of Systems  Constitutional:  Positive for malaise/fatigue.  Respiratory:  Positive for shortness of breath.   Cardiovascular:  Positive for leg swelling.      Allergies   Allergen Reactions   Isosorbide  Cough     Past Medical History:  Diagnosis Date   (HFpEF) heart failure with preserved ejection fraction (HCC) 07/25/2016   a.)TTE 07/25/16: EF >55, triv-mild pan regur, mild AS, G2DD; b.)TTE 09/02/16: EF >55, triv TR, mild AS; c.)TTE 11/05/16: EF >55, LAE, triv-mild pan regur, mild AS, G2DD; d.)TTE 01/21/20: EF >55, mod LVH, LAE/RVE, triv-mild AR/PR/TR, mod MR, mild AS, G2DD; e.)TTE 09/24/22: EF>55, sev LVH, sev LAE, RAE, mild PR, mod AR/MR/TR, mild-mod AS, RVSP 74.7; f.)TTE 12/23/22: EF 60-65, LAE, mild-mod MR/TR, mild AS   Abnormal urine 05/01/2023   Anemia    Aortic atherosclerosis (HCC)    Aortic stenosis 07/25/2016   a.) TTE 07/25/2016: mild (MPG 13.5); b.) TTE 09/02/2016: mild (MPG 16); c.) TTE 11/05/2016: mild (MPG 9); d.) TTE 01/21/2020: mild (MPG 11.7); e.) TTE 09/24/2022: mild-mod (MPG 20.3); f.) TTE 12/23/2022: mild (MPG 15)   Atrial fibrillation (HCC)    a.) CHA2DS2VASc = 6 (age x2, CHF, HTN, vascular disease history, T2DM);  b.) rate/rhythm maintained on oral labetalol ; chronically anticoagulated with apixaban  + clopidogrel    AV block, 1st degree    B12 deficiency    CKD (chronic kidney disease), stage III (HCC)    Colon polyps    Congestive heart failure (HCC) 05/01/2023   Coronary artery disease 08/16/2016   a.) s/p 3v CABG 08/30/2016; b.) s/p PCI 11/26/2016 (DES x 1 oSVG-RCA); c.) s/p PCI 12/17/2016 (DES x 5 --> mLCx x2, dLCx, pLAD, dLAD); c.) s/p PCI 06/09/2020 (DES x1 --> ISR oSVG-PDA)   Cyst of kidney, acquired 07/03/2023   Dyspnea    Gastroesophageal reflux  disease 05/01/2023   GERD (gastroesophageal reflux disease)    Heart murmur    Hepatic steatosis    History of 2019 novel coronavirus disease (COVID-19) 09/02/2020   a.) Tx'd with remdesivir    History of blood transfusion    History of GI bleed 09/02/2020   a.) admitted to [facility] 09/02/2020 - 09/08/2020   History of heart artery stent 11/26/2016   TOTAL of 7 stents: a.) 11/26/2016  --> 3.5 x 22 mm Resolute Integrity oSVG-RCA; b.) 12/17/2016: 3.0 x 12 mm Xience Alpine dLCx, 3.5 x 22 mm Resolute Onyx mLCx, 3.5 x 18 mm Resolute Onyx mLCx, 3.0 x 26 mm Resolute Onyx pLAD, 2.0 x 26 mm Resolute Onyx dLAD; c.) 06/09/2020 --> 2.5 x 18 mm Resolute Onyx ISR oSVG-PDA   History of partial colectomy 08/12/2012   a.) large gastric hyperplastic polyp removal   HLD (hyperlipidemia)    Hypertension    LBBB (left bundle branch block)    Long term current use of anticoagulant    a.) apixaban    Long term current use of antithrombotics/antiplatelets    a.) clopidogrel    Male hypogonadism    a.) on exogenous TRT (1.62% gel)   Mild cardiomegaly    Mild gynecomastia (bilateral)    Nephrolithiasis    OSA (obstructive sleep apnea)    a.) does not utilize nocturnal PAP therapy   Personal history of surgery to heart and great vessels, presenting hazards to health 05/01/2023   Proteinuria, unspecified 08/01/2023   Pulmonary hypertension (HCC) 09/24/2022   a.) TTE 09/24/2022: RVSP 74.7; b.) TTE 12/23/2022: RVSP 31.3   RBBB (right bundle branch block with left anterior fascicular block)    Right inguinal hernia    Right thyroid  nodule    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   a.) LIMA-LAD, SVG-PDA, SVG-OM3   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/01/2023   Type 2 diabetes mellitus treated with insulin  (HCC)    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   Umbilical hernia    Unstable angina (HCC)    Vitamin D  deficiency      Past Surgical History:  Procedure Laterality Date   APPENDECTOMY     CARDIAC CATHETERIZATION Left 08/16/2016   Procedure: Left Heart Cath and Coronary Angiography;  Surgeon: Antonette Batters, MD;  Location: [facility] INVASIVE CV LAB;  Service: Cardiovascular;  Laterality: Left;   COLONOSCOPY N/A 09/07/2020   Procedure: COLONOSCOPY;  Surgeon: Irby Mannan, MD;  Location: [facility] ENDOSCOPY;  Service: Endoscopy;  Laterality: N/A;    COLONOSCOPY N/A 10/19/2023   Procedure: COLONOSCOPY;  Surgeon: Marnee Sink, MD;  Location: [facility] ENDOSCOPY;  Service: Endoscopy;  Laterality: N/A;   COLONOSCOPY WITH PROPOFOL  N/A 05/17/2021   Procedure: COLONOSCOPY WITH PROPOFOL ;  Surgeon: Irby Mannan, MD;  Location: [facility] ENDOSCOPY;  Service: Endoscopy;  Laterality: N/A;   CORONARY ANGIOPLASTY WITH STENT PLACEMENT Left 11/26/2016   Procedure: CORONARY ANGIOPLASTY WITH STENT PLACEMENT; Location: [facility]; Surgeon: Lynnette Saucer, MD   CORONARY ARTERY BYPASS GRAFT N/A 08/30/2016   Procedure: CORONARY ARTERY BYPASS GRAFT; Location: [facility]; Surgeon: Ammon Kanaris, MD   CORONARY STENT INTERVENTION N/A 06/09/2020   Procedure: CORONARY STENT INTERVENTION;  Surgeon: Antonette Batters, MD;  Location: [facility];  Service: Cardiovascular;  Laterality: N/A;   CORONARY STENT INTERVENTION Left 12/17/2016   Procedure: CORONARY STENT INTERVENTION; Location: [facility]; Surgeon: Lynnette Saucer, MD   ENDOSCOPIC MUCOSAL RESECTION N/A 09/22/2020   Procedure: ENDOSCOPIC MUCOSAL RESECTION;  Surgeon: Normie Becton.,  MD;  Location: [facility] ENDOSCOPY;  Service: Gastroenterology;  Laterality: N/A;   ESOPHAGOGASTRODUODENOSCOPY N/A 09/07/2020   Procedure: ESOPHAGOGASTRODUODENOSCOPY (EGD);  Surgeon: Irby Mannan, MD;  Location: [facility] ENDOSCOPY;  Service: Endoscopy;  Laterality: N/A;   ESOPHAGOGASTRODUODENOSCOPY N/A 10/19/2023   Procedure: EGD (ESOPHAGOGASTRODUODENOSCOPY);  Surgeon: Marnee Sink, MD;  Location: [facility] ENDOSCOPY;  Service: Endoscopy;  Laterality: N/A;   ESOPHAGOGASTRODUODENOSCOPY (EGD) WITH PROPOFOL  N/A 09/22/2020   Procedure: ESOPHAGOGASTRODUODENOSCOPY (EGD) WITH PROPOFOL ;  Surgeon: Brice Campi Albino Alu., MD;  Location: [facility] ENDOSCOPY;  Service: Gastroenterology;  Laterality: N/A;   ESOPHAGOGASTRODUODENOSCOPY (EGD) WITH PROPOFOL  N/A 08/26/2023   Procedure: ESOPHAGOGASTRODUODENOSCOPY (EGD) WITH PROPOFOL ;  Surgeon: Marnee Sink, MD;  Location: [facility]  ENDOSCOPY;  Service: Endoscopy;  Laterality: N/A;   FRACTURE SURGERY     HEMOSTASIS CLIP PLACEMENT  09/22/2020   Procedure: HEMOSTASIS CLIP PLACEMENT;  Surgeon: Brice Campi Albino Alu., MD;  Location: [facility] ENDOSCOPY;  Service: Gastroenterology;;   HEMOSTASIS CONTROL  09/22/2020   Procedure: HEMOSTASIS CONTROL;  Surgeon: Normie Becton., MD;  Location: [facility] ENDOSCOPY;  Service: Gastroenterology;;   INSERTION OF MESH  02/19/2023   Procedure: INSERTION OF MESH;  Surgeon: Emmalene Hare, MD;  Location: [facility] ORS;  Service: General;;  umbilical   LAPAROSCOPIC PARTIAL RIGHT COLECTOMY Right 08/12/2012   Procedure: LAPAROSCOPIC PARTIAL RIGHT COLECTOMY; Location: [facility]; Surgeon: Hortensia Ma, MD   LEFT HEART CATH AND CORONARY ANGIOGRAPHY Left 06/09/2020   Procedure: LEFT HEART CATH AND CORONARY ANGIOGRAPHY;  Surgeon: Antonette Batters, MD;  Location: [facility];  Service: Cardiovascular;  Laterality: Left;   LEFT HEART CATH AND CORS/GRAFTS ANGIOGRAPHY Left 11/20/2016   Procedure: Left Heart Cath and Cors/Grafts Angiography;  Surgeon: Antonette Batters, MD;  Location: [facility];  Service: Cardiovascular;  Laterality: Left;   POLYPECTOMY  09/22/2020   Procedure: POLYPECTOMY;  Surgeon: Brice Campi Albino Alu., MD;  Location: [facility] ENDOSCOPY;  Service: Gastroenterology;;   POLYPECTOMY  10/19/2023   Procedure: POLYPECTOMY;  Surgeon: Marnee Sink, MD;  Location: [facility] ENDOSCOPY;  Service: Endoscopy;;   SUBMUCOSAL LIFTING INJECTION  09/22/2020   Procedure: SUBMUCOSAL LIFTING INJECTION;  Surgeon: Normie Becton., MD;  Location: [facility] ENDOSCOPY;  Service: Gastroenterology;;   UMBILICAL HERNIA REPAIR N/A 02/19/2023   Procedure: HERNIA REPAIR UMBILICAL ADULT, open;  Surgeon: Emmalene Hare, MD;  Location: [facility] ORS;  Service: General;  Laterality: N/A;   XI ROBOTIC ASSISTED INGUINAL HERNIA REPAIR WITH MESH Right 02/19/2023   Procedure: XI ROBOTIC ASSISTED INGUINAL HERNIA REPAIR WITH MESH;  Surgeon:  Emmalene Hare, MD;  Location: [facility] ORS;  Service: General;  Laterality: Right;    Social History   Socioeconomic History   Marital status: Married    Spouse name: Not on file   Number of children: Not on file   Years of education: Not on file   Highest education level: Not on file  Occupational History   Not on file  Tobacco Use   Smoking status: Former    Current packs/day: 0.00    Types: Cigarettes    Quit date: 2000    Years since quitting: 25.3    Passive exposure: Never   Smokeless tobacco: Never  Vaping Use   Vaping status: Never Used  Substance and Sexual Activity   Alcohol use: No   Drug use: No   Sexual activity: Not Currently  Other Topics Concern   Not on file  Social History Narrative   Not on file   Social Drivers of Health   Financial Resource Strain: Not on file  Food Insecurity: No Food Insecurity (10/16/2023)   Hunger Vital Sign    Worried About Running Out of Food in the Last Year: Never true    Ran Out of Food in the Last Year: Never true  Transportation Needs: No Transportation Needs (10/16/2023)   PRAPARE - Administrator, Civil Service (Medical): No    Lack of Transportation (Non-Medical): No  Physical Activity: Not on file  Stress: Not on file  Social Connections: Moderately Isolated (10/17/2023)   Social Connection and Isolation Panel [NHANES]    Frequency of Communication with Friends and Family: More than three times a week    Frequency of Social Gatherings with Friends and Family: Three times a week    Attends Religious Services: Never    Active Member of Clubs or Organizations: No    Attends Banker Meetings: Never    Marital Status: Married  Catering manager Violence: Not At Risk (10/16/2023)   Humiliation, Afraid, Rape, and Kick questionnaire    Fear of Current or Ex-Partner: No    Emotionally Abused: No    Physically Abused: No    Sexually Abused: No    Family History  Problem Relation Age of Onset    Hyperlipidemia Mother    Heart disease Mother    Hypertension Father      Current Outpatient Medications:    acetaminophen  (TYLENOL ) 325 MG tablet, Take 2 tablets (650 mg total) by mouth every 6 (six) hours as needed for mild pain (pain score 1-3), fever or headache (or Fever >/= 101)., Disp: , Rfl:    cyanocobalamin  (VITAMIN B12) 1000 MCG tablet, Take 1,000 mcg by mouth daily., Disp: , Rfl:    dexlansoprazole (DEXILANT) 60 MG capsule, Take 1 capsule by mouth daily., Disp: , Rfl:    empagliflozin  (JARDIANCE ) 10 MG TABS tablet, Take 1 tablet (10 mg total) by mouth daily before breakfast., Disp: 30 tablet, Rfl: 0   Ferrous Sulfate (IRON ) 325 (65 Fe) MG TABS, Take 1 tablet by mouth daily., Disp: , Rfl:    labetalol  (NORMODYNE ) 100 MG tablet, Take 0.5 tablets (50 mg total) by mouth 2 (two) times daily., Disp: , Rfl:    lactulose  (CHRONULAC ) 10 GM/15ML solution, Taking 15-20 cc by mouth 3 to 4 times a day for constiption, Disp: 946 mL, Rfl: 2   nitroGLYCERIN  (NITROSTAT ) 0.4 MG SL tablet, Place 0.4 mg under the tongue every 5 (five) minutes as needed., Disp: , Rfl:    rosuvastatin  (CRESTOR ) 40 MG tablet, Take 40 mg by mouth daily., Disp: , Rfl:    sitaGLIPtin (JANUVIA) 100 MG tablet, Take 100 mg by mouth daily., Disp: , Rfl:    Testosterone  1.62 % GEL, Apply 2 Pump topically daily. One pump on each arm, Disp: 225 g, Rfl: 3   torsemide  (DEMADEX ) 20 MG tablet, Take 2 tablets (40 mg total) by mouth daily., Disp: 60 tablet, Rfl: 0   [Paused] amLODipine  (NORVASC ) 5 MG tablet, Take 5 mg by mouth 2 (two) times daily. (Patient not taking: Reported on 10/28/2023), Disp: , Rfl:    [Paused] hydrALAZINE  (APRESOLINE ) 50 MG tablet, Take 1 tablet by mouth 3 (three) times daily. (Patient not taking: Reported on 10/24/2023), Disp: , Rfl:   Physical exam:  Vitals:   12/09/23 1100  BP: (!) 153/72  Pulse: 62  Resp: 18  Temp: (!) 97.4 F (36.3 C)  TempSrc: Tympanic  SpO2: 93%  Weight: 208 lb (94.3 kg)    Physical Exam Cardiovascular:  Rate and Rhythm: Normal rate and regular rhythm.     Heart sounds: Murmur heard.  Pulmonary:     Effort: Pulmonary effort is normal.     Comments: Breath sounds decreased over right lung base Abdominal:     General: Bowel sounds are normal.     Palpations: Abdomen is soft.  Musculoskeletal:     Right lower leg: Edema present.     Left lower leg: Edema present.  Skin:    General: Skin is warm and dry.  Neurological:     Mental Status: He is alert and oriented to person, place, and time.      I have personally reviewed labs listed below:    Latest Ref Rng & Units 10/24/2023    1:31 PM  CMP  Glucose 70 - 99 mg/dL 161   BUN 8 - 23 mg/dL 32   Creatinine 0.96 - 1.24 mg/dL 0.45   Sodium 409 - 811 mmol/L 140   Potassium 3.5 - 5.1 mmol/L 4.0   Chloride 98 - 111 mmol/L 103   CO2 22 - 32 mmol/L 24   Calcium  8.9 - 10.3 mg/dL 9.4   Total Protein 6.5 - 8.1 g/dL 6.6   Total Bilirubin 0.0 - 1.2 mg/dL 0.8   Alkaline Phos 38 - 126 U/L 109   AST 15 - 41 U/L 48   ALT 0 - 44 U/L 49       Latest Ref Rng & Units 12/09/2023   10:47 AM  CBC  WBC 4.0 - 10.5 K/uL 4.6   Hemoglobin 13.0 - 17.0 g/dL 9.1   Hematocrit 91.4 - 52.0 % 29.5   Platelets 150 - 400 K/uL 101      Assessment and plan- Patient is a 78 y.o. male here for routine follow-up of anemia of chronic kidney disease   Patient received 5 doses of Venofer  between March and April 2025.  His hemoglobin has somewhat improved from 7.7-9.1.  Ferritin levels are presently normal at 135 with an iron  saturation of 17% and normal TIBC.  He does not require any IV iron  at this time.  CBC ferritin and iron  studies in 2 and 4 and 6 months and I will see him back in 6 months.  If his hemoglobin fails to improve and remains close to 9 or lower we will consider initiating EPO for anemia of chronic kidney disease at that time.  On my exam today patient has some symptoms of shortness of breath and there is concern  for reaccumulation of right pleural effusion in the setting of heart failure as well as bilateral lower extremity edema.  I did give him the option of going to the ER but patient will think about it and see if he would like to just wait and see cardiology tomorrow instead   Visit Diagnosis 1. Normocytic anemia   2. Other iron  deficiency anemia   3. Symptomatic anemia      Dr. Seretha Dance, MD, MPH [facility] [phone] 12/09/2023 2:49 PM

## 2023-12-14 DIAGNOSIS — J90 Pleural effusion, not elsewhere classified: Secondary | ICD-10-CM

## 2023-12-16 DIAGNOSIS — D696 Thrombocytopenia, unspecified: Secondary | ICD-10-CM | POA: Insufficient documentation

## 2023-12-16 DIAGNOSIS — N184 Chronic kidney disease, stage 4 (severe): Secondary | ICD-10-CM | POA: Insufficient documentation

## 2023-12-16 DIAGNOSIS — D509 Iron deficiency anemia, unspecified: Secondary | ICD-10-CM | POA: Insufficient documentation

## 2023-12-16 DIAGNOSIS — I517 Cardiomegaly: Secondary | ICD-10-CM | POA: Insufficient documentation

## 2023-12-16 DIAGNOSIS — D638 Anemia in other chronic diseases classified elsewhere: Secondary | ICD-10-CM | POA: Insufficient documentation

## 2023-12-23 ENCOUNTER — Other Ambulatory Visit

## 2023-12-23 ENCOUNTER — Ambulatory Visit: Admitting: Oncology

## 2023-12-26 ENCOUNTER — Encounter: Admitting: Pulmonary Disease

## 2024-01-02 ENCOUNTER — Encounter: Payer: Self-pay | Admitting: Gastroenterology

## 2024-01-23 DIAGNOSIS — Z952 Presence of prosthetic heart valve: Secondary | ICD-10-CM | POA: Insufficient documentation

## 2024-01-27 ENCOUNTER — Ambulatory Visit: Payer: Medicare HMO | Admitting: Dermatology

## 2024-02-04 ENCOUNTER — Other Ambulatory Visit: Payer: Self-pay

## 2024-02-04 ENCOUNTER — Emergency Department
Admission: EM | Admit: 2024-02-04 | Discharge: 2024-02-04 | Disposition: A | Discharge status: Home / Self Care | Attending: Emergency Medicine | Admitting: Emergency Medicine

## 2024-02-04 DIAGNOSIS — Z7901 Long term (current) use of anticoagulants: Secondary | ICD-10-CM | POA: Insufficient documentation

## 2024-02-04 DIAGNOSIS — G8918 Other acute postprocedural pain: Secondary | ICD-10-CM | POA: Insufficient documentation

## 2024-02-04 DIAGNOSIS — R58 Hemorrhage, not elsewhere classified: Secondary | ICD-10-CM | POA: Diagnosis present

## 2024-02-04 DIAGNOSIS — L7622 Postprocedural hemorrhage and hematoma of skin and subcutaneous tissue following other procedure: Secondary | ICD-10-CM | POA: Insufficient documentation

## 2024-02-04 LAB — BASIC METABOLIC PANEL WITH GFR
Anion gap: 11 (ref 5–15)
BUN: 63 mg/dL — ABNORMAL HIGH (ref 8–23)
CO2: 24 mmol/L (ref 22–32)
Calcium: 10.1 mg/dL (ref 8.9–10.3)
Chloride: 107 mmol/L (ref 98–111)
Creatinine, Ser: 2.64 mg/dL — ABNORMAL HIGH (ref 0.61–1.24)
GFR, Estimated: 24 mL/min — ABNORMAL LOW (ref >60)
Glucose, Bld: 178 mg/dL — ABNORMAL HIGH (ref 70–99)
Potassium: 4.6 mmol/L (ref 3.5–5.1)
Sodium: 142 mmol/L (ref 135–145)

## 2024-02-04 LAB — CBC WITH DIFFERENTIAL/PLATELET
Abs Immature Granulocytes: 0.04 10*3/uL (ref 0.00–0.07)
Basophils Absolute: 0.1 10*3/uL (ref 0.0–0.1)
Basophils Relative: 1 %
Eosinophils Absolute: 0.2 10*3/uL (ref 0.0–0.5)
Eosinophils Relative: 2 %
HCT: 26.3 % — ABNORMAL LOW (ref 39.0–52.0)
Hemoglobin: 8.3 g/dL — ABNORMAL LOW (ref 13.0–17.0)
Immature Granulocytes: 1 %
Lymphocytes Relative: 9 %
Lymphs Abs: 0.8 10*3/uL (ref 0.7–4.0)
MCH: 30.9 pg (ref 26.0–34.0)
MCHC: 31.6 g/dL (ref 30.0–36.0)
MCV: 97.8 fL (ref 80.0–100.0)
Monocytes Absolute: 0.7 10*3/uL (ref 0.1–1.0)
Monocytes Relative: 9 %
Neutro Abs: 6.5 10*3/uL (ref 1.7–7.7)
Neutrophils Relative %: 78 %
Platelets: 134 10*3/uL — ABNORMAL LOW (ref 150–400)
RBC: 2.69 MIL/uL — ABNORMAL LOW (ref 4.22–5.81)
RDW: 18 % — ABNORMAL HIGH (ref 11.5–15.5)
WBC: 8.3 10*3/uL (ref 4.0–10.5)
nRBC: 0 % (ref 0.0–0.2)

## 2024-02-04 MED ORDER — LIDOCAINE-EPINEPHRINE 2 %-1:100000 IJ SOLN
20.0000 mL | Freq: Once | INTRAMUSCULAR | Status: AC
  Administered 2024-02-04: 20 mL via INTRADERMAL
  Filled 2024-02-04: qty 1

## 2024-02-04 MED ORDER — TRANEXAMIC ACID FOR EPISTAXIS
500.0000 mg | Freq: Once | TOPICAL | Status: AC
  Administered 2024-02-04: 500 mg via TOPICAL
  Filled 2024-02-04: qty 5
  Filled 2024-02-04: qty 10

## 2024-02-04 MED ORDER — TRANEXAMIC ACID FOR EPISTAXIS
500.0000 mg | Freq: Once | TOPICAL | Status: AC
  Administered 2024-02-04: 500 mg via TOPICAL
  Filled 2024-02-04: qty 10
  Filled 2024-02-04: qty 5

## 2024-02-04 NOTE — ED Triage Notes (Signed)
 Pt to ED via [facility] from home c/o bleeding from incision. Pt had basal cell carcinoma removed yesterday fternoon. Pt started bleeding around 10pm and hasn't stopped bleeding since. Takes eliquis . Denies dizziness

## 2024-02-04 NOTE — ED Provider Notes (Signed)
 [facility] Provider Note    Event Date/Time   First MD Initiated Contact with Patient 02/04/24 [phone]     (approximate)   History   Post-op Problem   HPI [name] is a 78 y.o. male presenting today for postoperative bleeding.  Patient saw his dermatologist yesterday where he had a mastoid pedicle advancement flap separation.  1 area was sutured and cauterized but the other area was left open with a bandage.  He is on Eliquis .  He states in the evening when going to bed, he noted bleeding coming from the site.  They were unable to get the bleeding to stop and EMS was called.  Denies any lightheadedness symptoms.     Physical Exam   Triage Vital Signs: ED Triage Vitals  Encounter Vitals Group     BP 02/04/24 0421 (!) 183/58     Girls Systolic BP Percentile --      Girls Diastolic BP Percentile --      Boys Systolic BP Percentile --      Boys Diastolic BP Percentile --      Pulse Rate 02/04/24 0421 70     Resp 02/04/24 0421 18     Temp 02/04/24 0448 97.7 F (36.5 C)     Temp Source 02/04/24 0448 Oral     SpO2 02/04/24 0421 96 %     Weight 02/04/24 0418 199 lb (90.3 kg)     Height 02/04/24 0418 6' 1 (1.854 m)     Head Circumference --      Peak Flow --      Pain Score 02/04/24 0418 7     Pain Loc --      Pain Education --      Exclude from Growth Chart --     Most recent vital signs: Vitals:   02/04/24 0430 02/04/24 0448  BP: (!) 163/74   Pulse: 71   Resp: 16   Temp:  97.7 F (36.5 C)  SpO2: 99%    I have reviewed the vital signs. General:  Awake, alert, no acute distress. Head:  Normocephalic, Atraumatic. EENT:  PERRL, EOMI, Oral mucosa pink and moist, Neck is supple. Cardiovascular: Regular rate, 2+ distal pulses. Respiratory:  Normal respiratory effort, symmetrical expansion, no distress.   Extremities:  Moving all four extremities through full ROM without pain.   Neuro:  Alert and oriented.  Interacting appropriately.    Skin: Wound to right mastoid area with essentially skin removed rather than actual laceration with consistent oozing around the entire area. Psych: Appropriate affect.    ED Results / Procedures / Treatments   Labs (all labs ordered are listed, but only abnormal results are displayed) Labs Reviewed  CBC WITH DIFFERENTIAL/PLATELET - Abnormal; Notable for the following components:      Result Value   RBC 2.69 (*)    Hemoglobin 8.3 (*)    HCT 26.3 (*)    RDW 18.0 (*)    Platelets 134 (*)    All other components within normal limits  BASIC METABOLIC PANEL WITH GFR - Abnormal; Notable for the following components:   Glucose, Bld 178 (*)    BUN 63 (*)    Creatinine, Ser 2.64 (*)    GFR, Estimated 24 (*)    All other components within normal limits     EKG    RADIOLOGY    PROCEDURES:  Critical Care performed: Yes, see critical care procedure note(s)  Wound packing  Date/Time:  02/04/2024 6:50 AM  Performed by: Malvina Alm DASEN, MD Authorized by: Malvina Alm DASEN, MD  Consent: Verbal consent obtained Risks and benefits: risks, benefits and alternatives were discussed Consent given by: patient Required items: required blood products, implants, devices, and special equipment available Patient identity confirmed: verbally with patient Time out: Immediately prior to procedure a time out was called to verify the correct patient, procedure, equipment, support staff and site/side marked as required. Preparation: Patient was prepped and draped in the usual sterile fashion. Local anesthesia used: yes Anesthesia: local infiltration  Anesthesia: Local anesthesia used: yes Local Anesthetic: lidocaine  1% with epinephrine  Anesthetic total: 10 mL Patient tolerance: patient tolerated the procedure well with no immediate complications Comments: Lidocaine  with epi injected around wound site along with TXA gauze applied.  This was then applied a second time with cessation of  bleeding.   .Critical Care  Performed by: Malvina Alm DASEN, MD Authorized by: Malvina Alm DASEN, MD   Critical care provider statement:    Critical care time (minutes):  30   Critical care was necessary to treat or prevent imminent or life-threatening deterioration of the following conditions: Significant bleeding on Eliquis .   Critical care was time spent personally by me on the following activities:  Development of treatment plan with patient or surrogate, discussions with consultants, evaluation of patient's response to treatment, examination of patient, ordering and review of laboratory studies, ordering and review of radiographic studies, ordering and performing treatments and interventions, pulse oximetry, re-evaluation of patient's condition and review of old charts   I assumed direction of critical care for this patient from another provider in my specialty: no      MEDICATIONS ORDERED IN ED: Medications  tranexamic acid (CYKLOKAPRON) 1000 MG/10ML topical solution 500 mg (500 mg Topical Given 02/04/24 0500)  lidocaine -EPINEPHrine  (XYLOCAINE  W/EPI) 2 %-1:100000 (with pres) injection 20 mL (20 mLs Intradermal Given 02/04/24 0500)  tranexamic acid (CYKLOKAPRON) 1000 MG/10ML topical solution 500 mg (500 mg Topical Given 02/04/24 0630)     IMPRESSION / MDM / ASSESSMENT AND PLAN / ED COURSE  I reviewed the triage vital signs and the nursing notes.                              Differential diagnosis includes, but is not limited to, postoperative bleed, bleeding on Eliquis   Patient's presentation is most consistent with acute presentation with potential threat to life or bodily function.  Patient is a 78 year old male presenting today for bleeding in the setting of recent skin removal behind his left ear and on Eliquis .  On arrival he is still oozing and bleeding but no rapid bleeding at this time.  Vital signs are stable.  Laboratory workup shows anemia relatively consistent with his  baseline.  Initially tried Surgicel with gauze packing and Coban.  This was unsuccessful with ongoing bleeding.  Then tried lido with epi injections directly into the skin and TXA gauze applied.  Mild oozing after this.  Reopen the wound and it is significantly better with only 1 small pinpoint spot of oozing.  Reinjected additional lido with epi and applied another TXA gauze dressing.  Patient was reassessed after 40 minutes with absolutely no ongoing bleeding symptoms at this time.  Feeling asymptomatic with no lightheadedness.  Patient has planned revisit with his dermatologist at 7:30 AM which is 30 minutes from his discharge at this time.  He feels comfortable with going straight from here to  his dermatologist given no ongoing bleeding at this time and otherwise stable vitals.  Told to go straight there or return for any worsening bleeding symptoms which she is agreeable with.  The patient is on the cardiac monitor to evaluate for evidence of arrhythmia and/or significant heart rate changes. Clinical Course as of 02/04/24 0649  Tue Feb 04, 2024  0536 Hemoglobin(!): 8.3 Relatively comparable with prior baseline [DW]  0620 Bleeding symptoms have significantly improved.  Have reapplied additional lido with epi and TXA gauze to the site.  If no ongoing bleeding after another 30 to 40 minutes of observation, will discharge directly to the dermatologist office that opens at 7:30 AM [DW]  0621 Creatinine(!): 2.64 Comparable to baseline [DW]  0647 No evidence of any ongoing bleeding to the bandage at this time.  He already has planned follow-up with his dermatologist at 7:30 AM this morning and will send him from the ER to that appointment for recheck of wound and any additional treatment [DW]    Clinical Course User Index [DW] Malvina Alm DASEN, MD     FINAL CLINICAL IMPRESSION(S) / ED DIAGNOSES   Final diagnoses:  Post-operative pain  Bleeding     Rx / DC Orders   ED Discharge Orders      None        Note:  This document was prepared using Dragon voice recognition software and may include unintentional dictation errors.   Malvina Alm DASEN, MD 02/04/24 [phone]

## 2024-02-04 NOTE — Discharge Instructions (Signed)
 Please go directly to your dermatologist for reevaluation and recheck of the wound.  Return for any worsening symptoms.

## 2024-02-04 NOTE — ED Notes (Signed)
 Discharge instructions reviewed with patient and family member. Pt instructed to go directly to his dermatologist from here to have wound reevaluated. Pt instructed to return to ED for new or worsening symptoms. Pt voiced understanding of all discharge instructions.

## 2024-02-07 ENCOUNTER — Inpatient Hospital Stay: Attending: Oncology

## 2024-02-12 NOTE — Progress Notes (Signed)
 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   Subjective:    HPI:  Patient is a 78 y.o. male is evaluated in the thoracic surgery clinic after successfully undergoing the above listed surgical procedure with Dr. Brigid.  The postoperative course was unremarkable. The patient was discharged to home on 01/24/2024. Postop echo noted EF >55%, mild AI, mean AoV gradient 16; mild MS, mild MR.  Since discharge, he continues to progress well.  Mr. Melland denies weight gain, fevers, and drainage from surgical incision. The patient  reports he still has episodic chest wall discomfort; is still sleeping in a recliner at home but he feels it is out of habit.  He does feel that his breathing and fluid status is improved from pre-TAVR.  Since the TAVR he also underwent a Mohs procedure to his L ear that was c/b bleeding that has been addressed. Patient reports activity level and tolerance since discharge has been continuing to improve.  Current NYHA class II.  Current Outpatient Medications  Medication Sig Dispense Refill  . acetaminophen  (TYLENOL ) 325 MG tablet Take 650 mg by mouth every 6 (six) hours as needed for Pain    . apixaban  (ELIQUIS ) 5 mg tablet Take 1 tablet (5 mg total) by mouth every 12 (twelve) hours    . atorvastatin (LIPITOR) 80 MG tablet Take 1 tablet (80 mg total) by mouth once daily 90 tablet 3  . BD ULTRA-FINE MINI PEN NEEDLE 31 gauge x 3/16 needle as directed    . carvediloL (COREG) 6.25 MG tablet Take 1 tablet (6.25 mg total) by mouth every 12 (twelve) hours 180 tablet 3  . cyanocobalamin  (VITAMIN B12) 1000 MCG tablet Take 1,000 mcg by mouth once daily    . dexlansoprazole (DEXILANT) 60 mg DR capsule Take 1 capsule (60 mg total) by mouth once daily 90 capsule 3  . empagliflozin  (JARDIANCE ) 10 mg tablet Take 10 mg by mouth every morning before breakfast    . ferrous sulfate 325 (65 FE) MG tablet Take 325 mg  by mouth daily with breakfast    . hydrALAZINE  (APRESOLINE ) 50 MG tablet Take 1 tablet (50 mg total) by mouth 2 (two) times daily 180 tablet 3  . lactulose  (ENULOSE ) 10 gram/15 mL oral solution Taking 15-20 cc by mouth 3 to 4 times a day for constiption    . ONETOUCH ULTRA BLUE TEST STRIP test strip     . spironolactone  (ALDACTONE ) 25 MG tablet Take 1 tablet (25 mg total) by mouth once daily 90 tablet 3  . testosterone  (ANDROGEL ) 1.62 % (20.25 mg/1.25 gram) gel packet Place onto the skin once daily    . TORsemide  (DEMADEX ) 20 MG tablet Take 1 tablet (20 mg total) by mouth once daily    . aspirin  81 MG EC tablet Take 1 tablet (81 mg total) by mouth once daily for 1 day     No current facility-administered medications for this visit.   Allergies  Allergen Reactions  . Isosorbide  Cough   Past Medical History:  Diagnosis Date  . Diabetes (CMS/HHS-HCC)   . GERD (gastroesophageal reflux disease)   . Hypothyroidism   . Intermediate coronary syndrome (CMS/HHS-HCC)   . OSA (obstructive sleep apnea)   . Other and unspecified angina pectoris ()   . Other and unspecified hyperlipidemia   . Thyroid  disease treated with iodine ablation (remote; pt doesn't remember why)   . Unspecified essential hypertension  Past Surgical History:  Procedure Laterality Date  . COLON SURGERY  2013   partial colectomy  . COLONOSCOPY  12/04/2013  . CORONARY ARTERY BYPASS W/SINGLE ARTERY GRAFT N/A 08/30/2016   Procedure: CORONARY ARTERY BYPASS, USING ARTERIAL GRAFT(S); SINGLE ARTERIAL GRAFT, Left internal mammary artery harvest;  Surgeon: Juliene Charlie Pouch, MD;  Location: [facility];  Service: Cardiothoracic;  Laterality: N/A;  . CORONARY ARTERY BYPASS W/VEIN ONLY N/A 08/30/2016   Procedure: CORONARY ARTERY BYPASS, USING VENOUS GRAFT(S) AND ARTERIAL GRAFT(S);SINGLE VEIN GRAFT (LIST IN ADDITION TO PRIMARY PROCEDURE);  Surgeon: Juliene Charlie Pouch, MD;  Location: [facility];  Service:  Cardiothoracic;  Laterality: N/A;  . APPLICATION WOUND VAC N/A 08/30/2016   Procedure: NEGATIVE PRESSURE WOUND THERAPY, INCLUDING TOPICAL APPLICATION(S), ASSESSMENT, INSTRUCTION(S) FOR ONGOING CARE, PER SESSION; TOTAL WOUND(S) SURFACE AREA LESS THAN OR EQUAL TO 50 SQUARE CENTIMETERS;  Surgeon: Juliene Charlie Pouch, MD;  Location: [facility];  Service: Cardiothoracic;  Laterality: N/A;  . 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 APPENDECTOMY     ROS:  All negative except what is noted in the HPI.    Objective:   PE:  BP (!) 159/41 (BP Location: Right upper arm, Patient Position: Sitting, BP Cuff Size: Adult) Comment: MAP 66  Pulse 63   Temp 36.3 C (97.3 F) (Oral)   Resp 18   Ht 182.9 cm (6')   Wt 88.5 kg (195 lb 1.7 oz)   SpO2 99%   BMI 26.46 kg/m  General: well developed, well nourished, and in no acute distress Neurologic:  normal mood and affect and non focal exam  Neck:  supple, Trachea  is midline., and There is no JVD present.  Lungs:  clear to ausculatation bilaterally  and diminished breath sounds ausultated R base      Heart:  S1 and S2 present , no rub, no gallop, irregularly irregular rhythm, and soft holosystolic murmur noted left lateral Abdomen: soft , nontender, nondistended, and bowel sounds present in all four quadrants Extremities:  There is no peripherial cyanosis, clubbing or edema. Incisions: Femoral incision  well healed , clean, dry and intact , and without drainage Diagnostic studies:  Recent Results (from the past 24 hours)  Complete Blood Count (CBC)   Collection Time: 02/25/24  9:21 AM  Result Value Ref Range   WBC (White Blood Cell Count) 7.1 3.2 - 9.8 x10^9/L   Hemoglobin 7.6 (L) 13.7 - 17.3  g/dL   Hematocrit 75.4 (L) 60.9 - 49.0 %   Platelets 108 (L) 150 - 450 x10^9/L   MCV (Mean Corpuscular Volume) 106 (H) 80 - 98 fL   MCH (Mean Corpuscular Hemoglobin) 32.9 26.5 - 34.0 pg   MCHC (Mean Corpuscular Hemoglobin Concentration) 31.0 (L) 31.5 - 36.3 %   RBC (Red Blood Cell Count) 2.31 (L) 4.37 - 5.74 x10^12/L   RDW-CV (Red Cell Distribution Width) 21.0 (H) 11.5 - 14.5 %   NRBC (Nucleated Red Blood Cell Count) 0.00 0 x10^9/L   NRBC % (Nucleated Red Blood Cell %) 0.0 %   MPV (Mean Platelet Volume) 12.1 (H) 7.2 - 11.7 fL  Basic Metabolic Panel (BMP)   Collection Time: 02/25/24  9:21 AM  Result Value Ref Range   Sodium 139 135 - 145 mmol/L   Potassium 4.7 3.5 - 5.0  mmol/L   Chloride 103 98 - 108 mmol/L   Carbon Dioxide (CO2) 23 21 - 30 mmol/L   Urea  Nitrogen (BUN) 56 (H) 7 - 20 mg/dL   Creatinine 3.4 (H) 0.6 - 1.3 mg/dL   Glucose 874 70 - 859 mg/dL   Calcium  10.0 8.7 - 10.2 mg/dL   Anion Gap 13 (H) 3 - 12 mmol/L   BUN/CREA Ratio 16 6 - 27   Glomerular Filtration Rate (eGFR)  18 mL/min/1.73sq m  ECG 12-lead   Collection Time: 02/25/24  9:43 AM  Result Value Ref Range   Vent Rate (bpm) 58    QRS Interval (msec) 170    QT Interval (msec) 482    QTc (msec) 473    PA and lateral chest x-ray:   EKG:   Assessment:    The patient is status post TF TAVR.  Since discharge the patient is continues to progress well..  Plan:    Medications:  Continue current regimen. He is scheduled for f/u with nephology (Dr. Dominica on [place]; he is asked to discuss whether to reduce the torsemide   Antiplatelet/Anticoagulation Recommendation:  ASA, Eliquis  for AF  Additional Instructions:   Counseling discussion included heart healthy lifestyle inclusive of avoidance of tobacco products, weight control, blood pressure and blood sugar control, and regular exercise regimen. The patient was educated to take lifelong prophylactic antibiotics prior to any elective invasive procedures. No  elective dental procedures for 3 months after surgery  The patient was encouraged to participate in cardiopulmonary rehabilitation.  He will consider it.  The patient was instructed to follow up with cardiology:  Perley Lye, NP as planned 03/23/2024,  Dr. Florencio as scheduled 03/10/2024, primary care provider [name], MD Patient is asked to contact his oncologist Dr. Melanee about his blood counts today; reports he has required blood/iron  transfusions in the past.   As needed and return prn to the thoracic surgery clinic. The patient was instructed to call immediately with any signs of surgical wound infection.   The patient verbalized understanding and agreed to formulated plans.      Attestation Statement:   I personally performed the service, non-incident to. (WP)     DELON KANDICE CHENG, PA  Cardiothoracic Surgery

## 2024-02-13 ENCOUNTER — Encounter: Admitting: Pulmonary Disease

## 2024-02-25 ENCOUNTER — Telehealth: Payer: Self-pay | Admitting: *Deleted

## 2024-02-25 DIAGNOSIS — D649 Anemia, unspecified: Secondary | ICD-10-CM

## 2024-02-25 NOTE — Telephone Encounter (Signed)
 Patient was told to see if he could contact the oncologist to see whether now the patient might need some IV iron  or a blood transfusion based on the doctor here.  Told the patient that Dr. Helena is out for 3 weeks on vacation and that I would give the information to the doctor that is covering and probably get an answer tomorrow.  Patient is okay with this.

## 2024-02-26 NOTE — Telephone Encounter (Signed)
 Labs and appointment scheduled for 03/05/24.  No further follow up needed at this time.

## 2024-02-26 NOTE — Addendum Note (Signed)
 Addended by: FAUSTINO BENCE on: 02/26/2024 01:52 PM   Modules accepted: Orders

## 2024-02-26 NOTE — Telephone Encounter (Addendum)
 Per Dr. Babara Hb is 7.6. please arrange him to get lab prior to NP +/- Venofer .  Check cbc hold tube iron  tibc ferritin.  Outbound call; spoke to patient and informed of above.  Patient said he can tell he's  low.  Informed patient scheduling will be in touch later today to coordinate lab visit followed by seeing a provider next week; informed more than likely it will be on Thursday (Josh is available the 24th).  Informed patient to disregard the LOV visit that was previously scheduled for 03/10/24 (please cancel).  Forwarding to scheduling to coordinate appointment.

## 2024-02-27 NOTE — Progress Notes (Signed)
 Follow Up Visit   Patient Name: [name], male   Patient DOB: 04-08-1946 Date of Service: 02/27/2024  Patient MRN: [id number] Provider Creating Note: Pinkey Edman, MD  [phone] Primary Care Physician:   [address] [place] [address] Additional Physicians/ Providers:    History of Present Illness [name] is a 78 y.o. male now comes to the office for follow-up.  He has a past medical history of diabetes, peripheral vascular disease, hypertension, coronary artery disease, congestive heart failure, history of coronary artery bypass graft surgery, history of stent placement, chronic kidney disease with anemia and proteinuria now comes for renal follow-up. He had TAVR last month during which time he had a CT scan of the chest with IV contrast.  His GFR has gone down from 25 to 17 cc/min.   He had a renal sonogram showing bilateral benign renal cysts. He has been on spironolactone , carvedilol , hydralazine  and torsemide . He has been followed by hematology for anemia for iron  infusions.   He was started on empagliflozin  10 mg daily and has been tolerating well. He has been very careful with his diet. He is found to have elevated calcium  levels.  he most recent one was 10.6 Patient said he always had high calcium  levels and is being monitored closely.  Medications   Current Outpatient Medications:  .  atorvastatin  (LIPITOR ) 80 MG tablet, Take 80 mg by mouth in the morning., Disp: , Rfl:  .  carvedilol  (COREG ) 6.25 MG tablet, Take 6.25 mg by mouth in the morning and 6.25 mg in the evening., Disp: , Rfl:  .  lactulose  (CHRONULAC ) 10 GM/15ML solution, TAKING 15-20 ML BY MOUTH 3 TO 4 TIMES A DAY FOR CONSTIPTION, Disp: , Rfl:  .  spironolactone  (ALDACTONE ) 25 MG tablet, Take 25 mg by mouth in the morning., Disp: , Rfl:  .  acetaminophen  (TYLENOL ) 500 MG tablet, Take 2 tablets (1,000 mg total) by mouth every 6 (six) hours as needed for mild pain., Disp: , Rfl:  .  apixaban  (ELIQUIS ) 5  MG tablet, Take 5 mg by mouth in the morning and 5 mg in the evening., Disp: , Rfl:  .  cyanocobalamin  (VITAMIN B-12) 1000 MCG tablet, Take 1,000 mcg by mouth 1 (one) time each day, Disp: , Rfl:  .  dexlansoprazole (DEXILANT) 60 MG DR capsule, Take 60 mg by mouth in the morning., Disp: , Rfl:  .  Empagliflozin  (Jardiance ) 10 MG tablet, Take 10 mg by mouth in the morning., Disp: 90 tablet, Rfl: 3 .  Ferrous Sulfate  (Iron ) 325 (65 Fe) MG tablet, Take 1 tablet by mouth 1 (one) time each day, Disp: , Rfl:  .  hydrALAZINE  50 MG tablet, Take 1 tablet by mouth in the morning and 1 tablet in the evening and 1 tablet before bedtime., Disp: , Rfl:  .  nitroglycerin  (NITROSTAT ) 0.4 MG SL tablet, Place 0.4 mg under the tongue if needed, Disp: , Rfl:  .  Testosterone  1.62 % gel, Apply 2 Pump topically daily. One pump on each arm, Disp: , Rfl:  .  torsemide  (DEMADEX ) 20 MG tablet, Take 20 mg by mouth every other day Every other day, Disp: , Rfl:    Allergies Isosorbide   Problem List Patient Active Problem List  Diagnosis  . Gastroesophageal reflux disease  . Anemia in chronic kidney disease  . Obstructive sleep apnea syndrome  . Coronary atherosclerosis  . Paroxysmal atrial fibrillation (HCC)  . Hypertension  . Hyperlipidemia, not otherwise specified  .  Type 2 diabetes mellitus with diabetic chronic kidney disease (HCC)  . Personal history of surgery to heart  . Stage 3b chronic kidney disease (HCC)  . Congestive heart failure, not otherwise specified (HCC)  . Aquired multiple cysts of kidney  . Hypercalcemia  . Proteinuria, not otherwise specified     Review of Systems  Constitutional: Negative.   HENT: Negative.    Eyes: Negative.   Respiratory: Negative.    Cardiovascular:  Positive for leg swelling.  Gastrointestinal: Negative.   Genitourinary: Negative.   Musculoskeletal: Negative.   Skin: Negative.      History Past Medical History:  Diagnosis Date  . Anemia   . Arthritis    . Atrial fibrillation (HCC)   . Chronic kidney disease   . Coronary atherosclerosis of unspecified type of vessel, native or graft   . Diabetes mellitus without mention of complication, type II or unspecified type, not stated as uncontrolled (HCC)   . Dyspnea   . Esophageal reflux   . Essential hypertension   . Male hypogonadism   . Obstructive sleep apnea syndrome   . Other and unspecified hyperlipidemia   . 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 Vitamin B12 deficiency     Past Surgical History:  Procedure Laterality Date  . APPENDECTOMY    . CARDIAC CATHETERIZATION    . COLONOSCOPY    . CORONARY ANGIOPLASTY WITH STENT PLACEMENT    . CORONARY ARTERY BYPASS GRAFT    . ESOPHAGOGASTRODUODENOSCOPY    . POLYPECTOMY    . UMBILICAL HERNIA REPAIR     Family History  Problem Relation Age of Onset  . Heart disease Mother   . Hypertension Father    Social History   Tobacco Use  . Smoking status: Former  . Smokeless tobacco: Never  Substance Use Topics  . Alcohol use: Never        Physical Exam  Vitals BP 143/64 (BP Location: Right upper arm, Patient Position: Sitting)   Pulse 62   Temp 97.8 F   Wt 196 lb (88.9 kg)   SpO2 97%   BMI 25.86 kg/m   PHYSICAL EXAM: General appearance: well developed, well nourished, NAD Neck: Trachea midline; supple Lungs: CTAB, with normal respiratory effort  CV: S1S2, no murmurs or rubs. Abdomen: Soft, non-tender; bowel sounds present Extremities: No peripheral edema   Laboratory Studies   Labs  Lab Units 02/24/24 0934 08/20/23 1017 07/30/23 1104 07/30/23 1050 05/01/23 1509 02/04/23 1255 01/29/23 1049  SODIUM mmol/L 140 143 141 140 141   < > 133*  POTASSIUM mmol/L 5.4* 4.9 4.6 4.4 4.5   < > 5.4*  CHLORIDE mmol/L 104 111* 101 101 106   < > 97  CO2 mmol/L 30 23 28  27.5 26   < > 24.2  BUN mg/dL 58* 33* 52* 53* 45*   < > 76*  CREATININE mg/dL 6.33* 7.57* 7.16* 2.7* 1.81*   < > 4.1*  EGFR  mL/min/1.52m2 16* 27* 23* 24* 39*   < > 14*  CALCIUM  mg/dL 89.4* 88.9* 88.9* 89.5* 11.7*   < > 10.9*  PHOSPHORUS mg/dL 3.6 2.4 2.8  --  2.2  --   --   ALBUMIN g/dL 4.1 4.2 4.2  --  4.1  --  4.3   < > = values in this interval not displayed.    CBC  Lab Units 02/24/24 0934 08/01/23 1010 07/30/23 1104 05/01/23 1509  WBC AUTO Thousand/uL 7.1  --  5.2 4.6  HEMOGLOBIN g/dL 7.8*  --  8.1* 9.3*  HEMOGLOBIN URINE  NEGATIVE NEGATIVE  --  NEGATIVE  HEMATOCRIT % 25.1*  --  25.6* 28.6*  MCV fL 104.1*  --  100.4* 99.7  PLATELETS AUTO Thousand/uL 106*  --  130* 113*    Urine  Lab Units 02/24/24 0934 12/16/23 1039 08/01/23 1010 05/01/23 1509 05/01/23 1426  COLOR U  YELLOW  --  YELLOW YELLOW  --   COLOR UA   --   --   --   --  Yellow  CLARITY UA   --   --   --   --  Clear  KETONES U MG/DL  NEGATIVE  --  NEGATIVE NEGATIVE  --   KETONES UA   --   --   --   --  Negative  PH UA   --   --   --   --  5.5  UROBILINOGEN UA   --   --   --   --  0.2  PROT/CREAT RATIO UR mg/g creat 0.327*  327*  --  1.135*  1,135* 1.211*  1,211*  --   ALB MG/G CREAT UR mg/g  --  278.5*  --   --   --     Imaging and Other Studies  RENAL / URINARY TRACT ULTRASOUND COMPLETE   COMPARISON:  CT chest 02/09/2011   FINDINGS:  Right Kidney:   Renal measurements: 13.0 x 5.1 x 5.2 cm = volume: 178 mL. Normal  renal cortical thickness and echogenicity. No hydronephrosis. There  are multiple cysts within the right kidney measuring up to 3 cm. No  imaging follow-up needed.   Left Kidney:   Renal measurements: 13.1 x 5.0 x 5.1 cm = volume: 172 mL. Normal  renal cortical thickness and echogenicity. No hydronephrosis.  Multiple cysts within the left kidney measuring up to 4.3 cm. No  imaging follow-up needed.   Bladder:   Appears normal for degree of bladder distention.   Other:   Small bilateral pleural effusions. Trace ascites, predominantly  perihepatic.   IMPRESSION:  1. No hydronephrosis.  2. Small  bilateral pleural effusions.  3. Trace ascites.    Electronically Signed    By: Bard Moats M.D.    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 Renal Function Panel  . CBC and Differential  . PTH, Intact  . Protein, Total, Random Urine w/Creatinine (Protein/Creat Ratio)  . Urinalysis        Impression/Recommendations   [name] is a 78 y.o. male with past medical history of diabetes, peripheral vascular disease, hypertension, coronary artery disease, congestive heart failure, history of coronary artery bypass graft surgery, status post TAVR, history of stent placement, chronic kidney disease with anemia and proteinuria now comes for renal follow-up.     #1: Chronic kidney disease: Patient has  chronic kidney disease stage IIIb/4 the most recent GFR of 16 cc/min and a creatinine of 3.66.  His CKD is associated with anemia and proteinuria.  He is classified into G3/A2.  Renal sonogram is essentially normal except benign cysts.  He was started on empagliflozin  10 mg daily and has been tolerating well.     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  Continue empagliflozin  10 mg daily.     #3: Anemia: Patient is being followed by hematology for anemia of chronic kidney disease.  He had a total of 5 blood transfusions so far.  I advised him  to follow-up with hematology regularly.    #4: Hypertension/CHF/CABG/status post TAVR: His blood pressure is well-controlled.  I advised him on the importance of 2 g salt restricted diet.  Will continue the carvedilol , hydralazine  , spironolactone  and torsemide .  He is advised to take the  torsemide  20 mg every other day.  He is also advised to stay on 1000 cc fluid restriction and check his weights daily.   #5: Secondary hyperparathyroidism/hypercalcemia: His PTH is elevated but phosphorus levels are within normal limits.  He is found to have elevated calcium  levels at 10.6.      #6: Diabetes: Metformin was discontinued.  He is now on sitagliptin at 100 mg daily.  He was also started on empagliflozin  10 mg daily and has been tolerating well.  I advised him on the importance of strict blood sugar control.  Dietary advice given to the patient.    I spoke to the patient in detail and answered all his questions to his satisfaction. I advised him to avoid nonsteroidal anti-inflammatory drugs.  Dietary advice given to the patient. Will check his renal function today. Dear Dr.Tate, Thank you for the opportunity to participate in the care of this very pleasant patient.    I discussed the assessment and treatment plan with the patient.  The patient was provided an opportunity to ask questions and all were answered.  The patient agreed with the plan and demonstrated an understanding of the instructions. The patient was advised to call back or seek an in-person evaluation if the symptoms worsen or if the condition fails to improve as anticipated.  Return in about 27 days (around 03/25/2024).  Disclaimer: Much of the narrative of this dictation was acquired using speech recognition software. It is possible that some dictated speech was not transcribed accurately by this system nor detected in the proofing. Such errors are at times unavoidable.    Pinkey Edman, MD [facility] Ph: [phone] Fax: [phone] 02/27/2024

## 2024-03-02 ENCOUNTER — Emergency Department

## 2024-03-02 ENCOUNTER — Other Ambulatory Visit: Payer: Self-pay

## 2024-03-02 ENCOUNTER — Inpatient Hospital Stay
Admission: EM | Admit: 2024-03-02 | Discharge: 2024-03-04 | DRG: 186 | Disposition: A | Discharge status: Home / Self Care | Attending: Internal Medicine | Admitting: Internal Medicine

## 2024-03-02 DIAGNOSIS — G4733 Obstructive sleep apnea (adult) (pediatric): Secondary | ICD-10-CM | POA: Diagnosis present

## 2024-03-02 DIAGNOSIS — I4811 Longstanding persistent atrial fibrillation: Secondary | ICD-10-CM | POA: Diagnosis present

## 2024-03-02 DIAGNOSIS — Z952 Presence of prosthetic heart valve: Secondary | ICD-10-CM | POA: Diagnosis not present

## 2024-03-02 DIAGNOSIS — J9601 Acute respiratory failure with hypoxia: Secondary | ICD-10-CM | POA: Diagnosis present

## 2024-03-02 DIAGNOSIS — R4189 Other symptoms and signs involving cognitive functions and awareness: Secondary | ICD-10-CM | POA: Diagnosis not present

## 2024-03-02 DIAGNOSIS — Z87891 Personal history of nicotine dependence: Secondary | ICD-10-CM

## 2024-03-02 DIAGNOSIS — I454 Nonspecific intraventricular block: Secondary | ICD-10-CM | POA: Diagnosis present

## 2024-03-02 DIAGNOSIS — I4891 Unspecified atrial fibrillation: Secondary | ICD-10-CM | POA: Diagnosis not present

## 2024-03-02 DIAGNOSIS — R404 Transient alteration of awareness: Secondary | ICD-10-CM | POA: Diagnosis not present

## 2024-03-02 DIAGNOSIS — I5032 Chronic diastolic (congestive) heart failure: Secondary | ICD-10-CM | POA: Diagnosis present

## 2024-03-02 DIAGNOSIS — Z7984 Long term (current) use of oral hypoglycemic drugs: Secondary | ICD-10-CM

## 2024-03-02 DIAGNOSIS — Z9889 Other specified postprocedural states: Secondary | ICD-10-CM

## 2024-03-02 DIAGNOSIS — I2489 Other forms of acute ischemic heart disease: Secondary | ICD-10-CM | POA: Diagnosis present

## 2024-03-02 DIAGNOSIS — E7849 Other hyperlipidemia: Secondary | ICD-10-CM | POA: Diagnosis present

## 2024-03-02 DIAGNOSIS — J918 Pleural effusion in other conditions classified elsewhere: Secondary | ICD-10-CM | POA: Diagnosis present

## 2024-03-02 DIAGNOSIS — Z7901 Long term (current) use of anticoagulants: Secondary | ICD-10-CM | POA: Diagnosis not present

## 2024-03-02 DIAGNOSIS — D631 Anemia in chronic kidney disease: Secondary | ICD-10-CM | POA: Diagnosis present

## 2024-03-02 DIAGNOSIS — E875 Hyperkalemia: Secondary | ICD-10-CM | POA: Diagnosis present

## 2024-03-02 DIAGNOSIS — E119 Type 2 diabetes mellitus without complications: Secondary | ICD-10-CM | POA: Diagnosis not present

## 2024-03-02 DIAGNOSIS — I2510 Atherosclerotic heart disease of native coronary artery without angina pectoris: Secondary | ICD-10-CM | POA: Diagnosis present

## 2024-03-02 DIAGNOSIS — K219 Gastro-esophageal reflux disease without esophagitis: Secondary | ICD-10-CM | POA: Diagnosis present

## 2024-03-02 DIAGNOSIS — J90 Pleural effusion, not elsewhere classified: Secondary | ICD-10-CM | POA: Diagnosis present

## 2024-03-02 DIAGNOSIS — D509 Iron deficiency anemia, unspecified: Secondary | ICD-10-CM | POA: Diagnosis present

## 2024-03-02 DIAGNOSIS — I480 Paroxysmal atrial fibrillation: Secondary | ICD-10-CM | POA: Diagnosis present

## 2024-03-02 DIAGNOSIS — E872 Acidosis, unspecified: Secondary | ICD-10-CM | POA: Diagnosis present

## 2024-03-02 DIAGNOSIS — I10 Essential (primary) hypertension: Secondary | ICD-10-CM | POA: Diagnosis not present

## 2024-03-02 DIAGNOSIS — T82855A Stenosis of coronary artery stent, initial encounter: Secondary | ICD-10-CM | POA: Diagnosis present

## 2024-03-02 DIAGNOSIS — R079 Chest pain, unspecified: Secondary | ICD-10-CM | POA: Diagnosis present

## 2024-03-02 DIAGNOSIS — I959 Hypotension, unspecified: Secondary | ICD-10-CM | POA: Diagnosis not present

## 2024-03-02 DIAGNOSIS — E1122 Type 2 diabetes mellitus with diabetic chronic kidney disease: Secondary | ICD-10-CM | POA: Diagnosis present

## 2024-03-02 DIAGNOSIS — D696 Thrombocytopenia, unspecified: Secondary | ICD-10-CM | POA: Diagnosis present

## 2024-03-02 DIAGNOSIS — Z1152 Encounter for screening for COVID-19: Secondary | ICD-10-CM | POA: Diagnosis not present

## 2024-03-02 DIAGNOSIS — R0902 Hypoxemia: Principal | ICD-10-CM

## 2024-03-02 DIAGNOSIS — Z7902 Long term (current) use of antithrombotics/antiplatelets: Secondary | ICD-10-CM | POA: Diagnosis not present

## 2024-03-02 DIAGNOSIS — D508 Other iron deficiency anemias: Secondary | ICD-10-CM | POA: Diagnosis not present

## 2024-03-02 DIAGNOSIS — N184 Chronic kidney disease, stage 4 (severe): Secondary | ICD-10-CM | POA: Diagnosis present

## 2024-03-02 DIAGNOSIS — Z951 Presence of aortocoronary bypass graft: Secondary | ICD-10-CM

## 2024-03-02 DIAGNOSIS — R092 Respiratory arrest: Secondary | ICD-10-CM | POA: Diagnosis not present

## 2024-03-02 DIAGNOSIS — I130 Hypertensive heart and chronic kidney disease with heart failure and stage 1 through stage 4 chronic kidney disease, or unspecified chronic kidney disease: Secondary | ICD-10-CM | POA: Diagnosis present

## 2024-03-02 DIAGNOSIS — Z955 Presence of coronary angioplasty implant and graft: Secondary | ICD-10-CM

## 2024-03-02 DIAGNOSIS — R0609 Other forms of dyspnea: Secondary | ICD-10-CM | POA: Diagnosis present

## 2024-03-02 DIAGNOSIS — Z79899 Other long term (current) drug therapy: Secondary | ICD-10-CM

## 2024-03-02 DIAGNOSIS — Y831 Surgical operation with implant of artificial internal device as the cause of abnormal reaction of the patient, or of later complication, without mention of misadventure at the time of the procedure: Secondary | ICD-10-CM | POA: Diagnosis present

## 2024-03-02 DIAGNOSIS — D649 Anemia, unspecified: Secondary | ICD-10-CM

## 2024-03-02 LAB — CBC
HCT: 26 % — ABNORMAL LOW (ref 39.0–52.0)
HCT: 28.4 % — ABNORMAL LOW (ref 39.0–52.0)
Hemoglobin: 8.1 g/dL — ABNORMAL LOW (ref 13.0–17.0)
Hemoglobin: 8.9 g/dL — ABNORMAL LOW (ref 13.0–17.0)
MCH: 33.6 pg (ref 26.0–34.0)
MCH: 34.2 pg — ABNORMAL HIGH (ref 26.0–34.0)
MCHC: 31.2 g/dL (ref 30.0–36.0)
MCHC: 31.3 g/dL (ref 30.0–36.0)
MCV: 107.9 fL — ABNORMAL HIGH (ref 80.0–100.0)
MCV: 109.2 fL — ABNORMAL HIGH (ref 80.0–100.0)
Platelets: 129 K/uL — ABNORMAL LOW (ref 150–400)
Platelets: 113 K/uL — ABNORMAL LOW (ref 150–400)
RBC: 2.41 MIL/uL — ABNORMAL LOW (ref 4.22–5.81)
RBC: 2.6 MIL/uL — ABNORMAL LOW (ref 4.22–5.81)
RDW: 20.6 % — ABNORMAL HIGH (ref 11.5–15.5)
RDW: 20.7 % — ABNORMAL HIGH (ref 11.5–15.5)
WBC: 7.1 K/uL (ref 4.0–10.5)
WBC: 8.1 K/uL (ref 4.0–10.5)
nRBC: 0 % (ref 0.0–0.2)
nRBC: 0 % (ref 0.0–0.2)

## 2024-03-02 LAB — BRAIN NATRIURETIC PEPTIDE: B Natriuretic Peptide: 1183.2 pg/mL — ABNORMAL HIGH (ref 0.0–100.0)

## 2024-03-02 LAB — URINALYSIS, COMPLETE (UACMP) WITH MICROSCOPIC
Bacteria, UA: NONE SEEN
Bilirubin Urine: NEGATIVE
Glucose, UA: >=500 mg/dL — AB
Hgb urine dipstick: NEGATIVE
Ketones, ur: NEGATIVE mg/dL
Leukocytes,Ua: NEGATIVE
Nitrite: NEGATIVE
Protein, ur: 100 mg/dL — AB
Specific Gravity, Urine: 1.014 (ref 1.005–1.030)
Squamous Epithelial / HPF: 0 /HPF (ref 0–5)
pH: 5 (ref 5.0–8.0)

## 2024-03-02 LAB — BASIC METABOLIC PANEL WITH GFR
Anion gap: 9 (ref 5–15)
Anion gap: 15 (ref 5–15)
BUN: 50 mg/dL — ABNORMAL HIGH (ref 8–23)
BUN: 50 mg/dL — ABNORMAL HIGH (ref 8–23)
CO2: 24 mmol/L (ref 22–32)
CO2: 19 mmol/L — ABNORMAL LOW (ref 22–32)
Calcium: 10.2 mg/dL (ref 8.9–10.3)
Calcium: 10.5 mg/dL — ABNORMAL HIGH (ref 8.9–10.3)
Chloride: 106 mmol/L (ref 98–111)
Chloride: 105 mmol/L (ref 98–111)
Creatinine, Ser: 2.82 mg/dL — ABNORMAL HIGH (ref 0.61–1.24)
Creatinine, Ser: 2.9 mg/dL — ABNORMAL HIGH (ref 0.61–1.24)
GFR, Estimated: 22 mL/min — ABNORMAL LOW (ref >60)
GFR, Estimated: 22 mL/min — ABNORMAL LOW (ref >60)
Glucose, Bld: 184 mg/dL — ABNORMAL HIGH (ref 70–99)
Glucose, Bld: 248 mg/dL — ABNORMAL HIGH (ref 70–99)
Potassium: 5.2 mmol/L — ABNORMAL HIGH (ref 3.5–5.1)
Potassium: 5.3 mmol/L — ABNORMAL HIGH (ref 3.5–5.1)
Sodium: 139 mmol/L (ref 135–145)
Sodium: 139 mmol/L (ref 135–145)

## 2024-03-02 LAB — GLUCOSE, CAPILLARY: Glucose-Capillary: 200 mg/dL — ABNORMAL HIGH (ref 70–99)

## 2024-03-02 LAB — RESP PANEL BY RT-PCR (RSV, FLU A&B, COVID)  RVPGX2
Influenza A by PCR: NEGATIVE
Influenza B by PCR: NEGATIVE
Resp Syncytial Virus by PCR: NEGATIVE
SARS Coronavirus 2 by RT PCR: NEGATIVE

## 2024-03-02 LAB — TROPONIN I (HIGH SENSITIVITY)
Troponin I (High Sensitivity): 35 ng/L — ABNORMAL HIGH (ref <18)
Troponin I (High Sensitivity): 36 ng/L — ABNORMAL HIGH (ref <18)
Troponin I (High Sensitivity): 85 ng/L — ABNORMAL HIGH (ref <18)

## 2024-03-02 LAB — CBG MONITORING, ED
Glucose-Capillary: 138 mg/dL — ABNORMAL HIGH (ref 70–99)
Glucose-Capillary: 135 mg/dL — ABNORMAL HIGH (ref 70–99)

## 2024-03-02 LAB — T4, FREE: Free T4: 1.04 ng/dL (ref 0.61–1.12)

## 2024-03-02 LAB — IRON AND TIBC
Iron: 80 ug/dL (ref 45–182)
Saturation Ratios: 30 % (ref 17.9–39.5)
TIBC: 272 ug/dL (ref 250–450)
UIBC: 192 ug/dL

## 2024-03-02 LAB — PROTIME-INR
INR: 1.7 — ABNORMAL HIGH (ref 0.8–1.2)
Prothrombin Time: 20.4 s — ABNORMAL HIGH (ref 11.4–15.2)

## 2024-03-02 LAB — LACTIC ACID, PLASMA: Lactic Acid, Venous: 6.2 mmol/L (ref 0.5–1.9)

## 2024-03-02 LAB — MAGNESIUM
Magnesium: 2.3 mg/dL (ref 1.7–2.4)
Magnesium: 2.5 mg/dL — ABNORMAL HIGH (ref 1.7–2.4)

## 2024-03-02 LAB — FERRITIN: Ferritin: 311 ng/mL (ref 24–336)

## 2024-03-02 LAB — PROCALCITONIN: Procalcitonin: <0.1 ng/mL

## 2024-03-02 LAB — TSH: TSH: 2.63 u[IU]/mL (ref 0.350–4.500)

## 2024-03-02 MED ORDER — INSULIN ASPART 100 UNIT/ML IJ SOLN
0.0000 [IU] | Freq: Three times a day (TID) | INTRAMUSCULAR | Status: DC
  Administered 2024-03-02: 1 [IU] via SUBCUTANEOUS
  Administered 2024-03-03 (×2): 2 [IU] via SUBCUTANEOUS
  Administered 2024-03-04: 1 [IU] via SUBCUTANEOUS
  Administered 2024-03-04: 2 [IU] via SUBCUTANEOUS
  Filled 2024-03-02 (×5): qty 1

## 2024-03-02 MED ORDER — NITROGLYCERIN 0.4 MG SL SUBL: 0.4000 mg | SUBLINGUAL_TABLET | SUBLINGUAL | Status: DC | PRN

## 2024-03-02 MED ORDER — FERROUS SULFATE 325 (65 FE) MG PO TABS
325.0000 mg | ORAL_TABLET | Freq: Every day | ORAL | Status: DC
  Administered 2024-03-03 – 2024-03-04 (×2): 325 mg via ORAL
  Filled 2024-03-02 (×2): qty 1

## 2024-03-02 MED ORDER — VITAMIN B-12 1000 MCG PO TABS
1000.0000 ug | ORAL_TABLET | Freq: Every day | ORAL | Status: DC
  Administered 2024-03-03 – 2024-03-04 (×2): 1000 ug via ORAL
  Filled 2024-03-02 (×2): qty 1

## 2024-03-02 MED ORDER — ACETAMINOPHEN 325 MG PO TABS
650.0000 mg | ORAL_TABLET | Freq: Four times a day (QID) | ORAL | Status: DC | PRN
  Filled 2024-03-02: qty 2

## 2024-03-02 MED ORDER — BENZONATATE 100 MG PO CAPS: 100.0000 mg | ORAL_CAPSULE | Freq: Two times a day (BID) | ORAL | Status: DC | PRN

## 2024-03-02 MED ORDER — LACTULOSE 10 GM/15ML PO SOLN: 10.0000 g | Freq: Two times a day (BID) | ORAL | Status: DC | PRN

## 2024-03-02 MED ORDER — INSULIN ASPART 100 UNIT/ML IJ SOLN
0.0000 [IU] | Freq: Every day | INTRAMUSCULAR | Status: DC
  Administered 2024-03-03: 2 [IU] via SUBCUTANEOUS
  Filled 2024-03-02: qty 1

## 2024-03-02 MED ORDER — HYDRALAZINE HCL 20 MG/ML IJ SOLN
5.0000 mg | Freq: Four times a day (QID) | INTRAMUSCULAR | Status: DC | PRN
  Filled 2024-03-02: qty 1

## 2024-03-02 MED ORDER — APIXABAN 5 MG PO TABS
5.0000 mg | ORAL_TABLET | Freq: Two times a day (BID) | ORAL | Status: DC
  Administered 2024-03-02 – 2024-03-04 (×4): 5 mg via ORAL
  Filled 2024-03-02 (×4): qty 1

## 2024-03-02 MED ORDER — CARVEDILOL 6.25 MG PO TABS
6.2500 mg | ORAL_TABLET | Freq: Two times a day (BID) | ORAL | Status: DC
  Administered 2024-03-02 – 2024-03-04 (×4): 6.25 mg via ORAL
  Filled 2024-03-02 (×4): qty 1

## 2024-03-02 MED ORDER — ONDANSETRON HCL 4 MG PO TABS: 4.0000 mg | ORAL_TABLET | Freq: Four times a day (QID) | ORAL | Status: DC | PRN

## 2024-03-02 MED ORDER — CHLORHEXIDINE GLUCONATE CLOTH 2 % EX PADS
6.0000 | MEDICATED_PAD | Freq: Every day | CUTANEOUS | Status: DC
  Administered 2024-03-03 – 2024-03-04 (×2): 6 via TOPICAL

## 2024-03-02 MED ORDER — PANTOPRAZOLE SODIUM 40 MG PO TBEC
40.0000 mg | DELAYED_RELEASE_TABLET | Freq: Every day | ORAL | Status: DC
  Administered 2024-03-03 – 2024-03-04 (×2): 40 mg via ORAL
  Filled 2024-03-02 (×2): qty 1

## 2024-03-02 MED ORDER — ORAL CARE MOUTH RINSE: 15.0000 mL | OROMUCOSAL | Status: DC | PRN

## 2024-03-02 MED ORDER — ATORVASTATIN CALCIUM 80 MG PO TABS
80.0000 mg | ORAL_TABLET | Freq: Every day | ORAL | Status: DC
  Administered 2024-03-03 – 2024-03-04 (×2): 80 mg via ORAL
  Filled 2024-03-02 (×2): qty 1

## 2024-03-02 MED ORDER — SENNOSIDES-DOCUSATE SODIUM 8.6-50 MG PO TABS: 1.0000 | ORAL_TABLET | Freq: Every evening | ORAL | Status: DC | PRN

## 2024-03-02 MED ORDER — ACETAMINOPHEN 650 MG RE SUPP: 650.0000 mg | Freq: Four times a day (QID) | RECTAL | Status: DC | PRN

## 2024-03-02 MED ORDER — ONDANSETRON HCL 4 MG/2ML IJ SOLN: 4.0000 mg | Freq: Four times a day (QID) | INTRAMUSCULAR | Status: DC | PRN

## 2024-03-02 NOTE — Progress Notes (Addendum)
 Code blue and critical care note:  Date of note: 03/02/2024.  Upon arrival to the floor the patient walked to the bathroom and later sat on the bed while changing into pajamas.  He was having a normal conversation with the RN and the CNA and then fell back in the bed unresponsive with agonal breathing.  It not respond to sternal rub.  Vital signs then showed heart rate of 38 and oxygen saturation of 79% with BP of 66/49.  Rapid response was called.  The patient continued to be unresponsive and a CODE BLUE was called.  The patient was bagged for approximately 2 minutes.  He then became responsive but was very confused initially and restless.  He was later able to answer questions appropriately.  There were no witnessed seizures.  Heart rate was up to 72 and O2 sat 100% with a BP of 114/66.  On physical exam he was alert and oriented x 3.  Vital signs as above.  Temperature was 97.6 and respiratory rate was up from 7-18.  Pulse oximetry was 100% on 2 L of O2 by nasal cannula and BP was up to 152/62.  Cardiovascular: Regular rate and rhythm with normal S1-S2 and no murmurs gallops or rubs.  Lungs reveal slightly diminished bibasal breath sounds more on the right than the left. Abdomen soft nontender with positive bowel sounds and no palpable megaly or masses.  Extremities showed trace bilateral lower extremity pitting edema with no clubbing or cyanosis.  Neurologic: Cranial nerves II through XII are grossly intact.  Muscle strength was equal 5/5 in both upper and lower extremities with normal sensory exam to light touch.  Gait was not tested.  Stat EKG showed atrial fibrillation with controlled ventricular sponsor 61 with right bundle branch block and left intrafascicular block (bifascicular block.  The patient had no current chest pain or palpitations. Labs and notes were reviewed.  Echo was ordered as well as CBC, CMP, magnesium  and pulmonary. CBC showed stable anemia with MAC cytosis and stable  thrombocytopenia.  Lactic acid came back elevated at 6.2.  High sensitive troponin I was 85 and BMP revealed potassium of 5.3 with a CO2 of 19 glucose of 248 BUN 15 creatinine 2.9 close to previous levels with calcium  of 10.5 and magnesium  2.5.  The patient procalcitonin earlier was less than 0.1. The patient was transferred to a stepdown unit bed for closer monitoring and observation.  Will continue with the current plan of care.  Assessment/plan: - Acute respiratory failure with hypoxia and associated unresponsiveness in the setting of paroxysmal atrial fibrillation with controlled ventricular response, responding to bag valve assisted ventilation. - The patient will be transferred to stepdown unit bed. - O2 protocol will be followed. - Given significant elevated lactic acid we will start him on empiric IV antibiotic therapy and obtain 2 blood cultures. - Will continue hydration with IV lactated ringer  and closely monitor him.  Authorized and performed by: Madison Peaches, MD Total critical care time:    35    minutes.  5 minutes were spent in the CODE BLUE 30 minutes in critical care. Due to a high probability of clinically significant, life-threatening deterioration, the patient required my highest level of preparedness to intervene emergently and I personally spent this critical care time directly and personally managing the patient.  This critical care time included obtaining a history, examining the patient, pulse oximetry, ordering and review of studies, arranging urgent treatment with development of management plan, evaluation of patient's  response to treatment, frequent reassessment, and discussions with other providers. This critical care time was performed to assess and manage the high probability of imminent, life-threatening deterioration that could result in multiorgan failure.  It was exclusive of separately billable procedures and treating other patients and teaching time.

## 2024-03-02 NOTE — H&P (Addendum)
 Addendum: Patient had a syncopal event, rapid response was called Cross coverage provider presented bedside Complete echo ordered for dyspnea on exertion and syncope Bed placement change to stepdown, transfer order placed   History and Physical   [name] [name] DOB: 05/06/1946 DOA: 03/02/2024  PCP: Pcp, No  Outpatient Specialists: Dr. Florencio Patient coming from: Home  I have personally briefly reviewed patient's old medical records in [facility] EMR.  Chief Concern: Chest pain, shortness of breath  HPI: [name] is a 78 year old male with history of atrial fibrillation on Eliquis , hyperlipidemia, hypertension, non-insulin -dependent diabetes mellitus, who presents emergency department for chief concerns of chest pain and shortness of breath.  Vitals in the ED showed tof 98.1, rr of 40 initially and now at 24, heart rate 68, blood pressure 167/66, SpO2 98% on room air.  Serum sodium is 139, potassium 4.2, chloride 106, bicarb 24, BUN of 50, serum creatinine of 2.82, EGFR 22, nonfasting glucose 184, WBC 7.1, hemoglobin 8.1, platelet 129.  HS troponin is 35.  ED treatment: None -------------------------------------- At bedside patient was able to tell me his first and last name, age, location, current, the year.  He reports over the last 2 days and night, he has been having chest pain.  He describes the pain as throbbing, dull like sensation with associated shortness of breath that is worse with exertion.  At night when he gets up to use the restroom, and sits down, he will feel the chest discomfort and shortness of breath.  Walking around the house causes him to have the symptoms as well.  The symptoms generally resolve with rest.  He denies trauma to his person.  He denies any known weight gain at this time.  He reports mild worsening swelling of his bilateral lower extremity however is not significantly noticeable.  He denies his pants feeling more  tight.  Social history: He lives at home.  He denies tobacco, EtOH, recreational drug use.  He is retired and previously worked for a [employer].  And then after retirement he worked at [employer].  ROS: Constitutional: no weight change, no fever ENT/Mouth: no sore throat, no rhinorrhea Eyes: no eye pain, no vision changes Cardiovascular: + chest pain, + dyspnea, + edema, no palpitations Respiratory: no cough, no sputum, no wheezing Gastrointestinal: no nausea, no vomiting, no diarrhea, no constipation Genitourinary: no urinary incontinence, no dysuria, no hematuria Musculoskeletal: no arthralgias, no myalgias Skin: no skin lesions, no pruritus, Neuro: + weakness, no loss of consciousness, no syncope Psych: no anxiety, no depression, no decrease appetite Heme/Lymph: no bruising, no bleeding  ED Course: Discussed with EDP, patient requiring hospitalization for chief concerns of chest pain, shortness of breath.  Assessment/Plan  Principal Problem:   Chest pain Active Problems:   CAD (coronary artery disease), native coronary artery   Paroxysmal atrial fibrillation (HCC)   CKD (chronic kidney disease), stage IV (HCC)   BBB (bundle branch block)   Essential hypertension   OSA (obstructive sleep apnea)   S/P CABG x 3   Other hyperlipidemia   S/P drug eluting coronary stent placement   Iron  deficiency anemia   Dyspnea on exertion   Diabetes mellitus type 2, noninsulin dependent (HCC)   Assessment and Plan:  * Chest pain Patient had complete echo last on 01/24/2024: Estimated ejection fraction is greater than 55%, calculated ejection fraction is 59%, normal left ventricular systolic function with moderate LVH no AAS, mild MS, no PS, no TS.  Estimated RVSP  is 70 mmHg Patient has not missed Eliquis  dosing at home and endorses compliance with all home medications Cardiology, [facility] clinic service has been consulted via Epic order and secure chat  CAD (coronary artery disease),  native coronary artery History of CABG in 2018 with ultimate failure, and PCI placement in 2021  Paroxysmal atrial fibrillation [facility]) Patient has not missed any home Eliquis  dosing Home Eliquis  5 mg p.o. twice daily, carvedilol  6.25 mg p.o. twice daily with meals resumed  CKD (chronic kidney disease), stage IV (HCC) At baseline  Diabetes mellitus type 2, noninsulin dependent (HCC) Home oral antiglycemic agents will not be resumed on admission Insulin  SSI with at bedtime coverage ordered  Dyspnea on exertion Query NSTEMI, cardiology has been consulted  Iron  deficiency anemia Mix with chronic kidney disease  Would recommend outpatient follow-up with nephrology as appropriate  Other hyperlipidemia Home atorvastatin  80 mg daily resumed  Essential hypertension Hydralazine  5 mg IV every 6 hours as needed for SBP > 170, 5 days ordered  Chart reviewed.   DVT prophylaxis: Eliquis  Code Status: Full code Diet: Heart healthy Family Communication: Updated daughter-in-law, Bari at bedside with patient's permission Disposition Plan: Pending clinical course Consults called: Cardiology Admission status: Telemetry cardiac, inpatient  Past Medical History:  Diagnosis Date   (HFpEF) heart failure with preserved ejection fraction (HCC) 07/25/2016   a.)TTE 07/25/16: EF >55, triv-mild pan regur, mild AS, G2DD; b.)TTE 09/02/16: EF >55, triv TR, mild AS; c.)TTE 11/05/16: EF >55, LAE, triv-mild pan regur, mild AS, G2DD; d.)TTE 01/21/20: EF >55, mod LVH, LAE/RVE, triv-mild AR/PR/TR, mod MR, mild AS, G2DD; e.)TTE 09/24/22: EF>55, sev LVH, sev LAE, RAE, mild PR, mod AR/MR/TR, mild-mod AS, RVSP 74.7; f.)TTE 12/23/22: EF 60-65, LAE, mild-mod MR/TR, mild AS   Abnormal urine 05/01/2023   Anemia    Aortic atherosclerosis (HCC)    Aortic stenosis 07/25/2016   a.) TTE 07/25/2016: mild (MPG 13.5); b.) TTE 09/02/2016: mild (MPG 16); c.) TTE 11/05/2016: mild (MPG 9); d.) TTE 01/21/2020: mild (MPG 11.7); e.) TTE  09/24/2022: mild-mod (MPG 20.3); f.) TTE 12/23/2022: mild (MPG 15)   Atrial fibrillation (HCC)    a.) CHA2DS2VASc = 6 (age x2, CHF, HTN, vascular disease history, T2DM);  b.) rate/rhythm maintained on oral labetalol ; chronically anticoagulated with apixaban  + clopidogrel    AV block, 1st degree    B12 deficiency    CKD (chronic kidney disease), stage III (HCC)    Colon polyps    Congestive heart failure (HCC) 05/01/2023   Coronary artery disease 08/16/2016   a.) s/p 3v CABG 08/30/2016; b.) s/p PCI 11/26/2016 (DES x 1 oSVG-RCA); c.) s/p PCI 12/17/2016 (DES x 5 --> mLCx x2, dLCx, pLAD, dLAD); c.) s/p PCI 06/09/2020 (DES x1 --> ISR oSVG-PDA)   Cyst of kidney, acquired 07/03/2023   Dyspnea    Gastroesophageal reflux disease 05/01/2023   GERD (gastroesophageal reflux disease)    Heart murmur    Hepatic steatosis    History of 2019 novel coronavirus disease (COVID-19) 09/02/2020   a.) Tx'd with remdesivir    History of blood transfusion    History of GI bleed 09/02/2020   a.) admitted to [facility] 09/02/2020 - 09/08/2020   History of heart artery stent 11/26/2016   TOTAL of 7 stents: a.) 11/26/2016 --> 3.5 x 22 mm Resolute Integrity oSVG-RCA; b.) 12/17/2016: 3.0 x 12 mm Xience Alpine dLCx, 3.5 x 22 mm Resolute Onyx mLCx, 3.5 x 18 mm Resolute Onyx mLCx, 3.0 x 26 mm Resolute Onyx pLAD, 2.0 x 26 mm Resolute  Onyx dLAD; c.) 06/09/2020 --> 2.5 x 18 mm Resolute Onyx ISR oSVG-PDA   History of partial colectomy 08/12/2012   a.) large gastric hyperplastic polyp removal   HLD (hyperlipidemia)    Hypertension    LBBB (left bundle branch block)    Long term current use of anticoagulant    a.) apixaban    Long term current use of antithrombotics/antiplatelets    a.) clopidogrel    Male hypogonadism    a.) on exogenous TRT (1.62% gel)   Mild cardiomegaly    Mild gynecomastia (bilateral)    Nephrolithiasis    OSA (obstructive sleep apnea)    a.) does not utilize nocturnal PAP therapy   Personal history of  surgery to heart and great vessels, presenting hazards to health 05/01/2023   Proteinuria, unspecified 08/01/2023   Pulmonary hypertension (HCC) 09/24/2022   a.) TTE 09/24/2022: RVSP 74.7; b.) TTE 12/23/2022: RVSP 31.3   RBBB (right bundle branch block with left anterior fascicular block)    Right inguinal hernia    Right thyroid  nodule    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   a.) LIMA-LAD, SVG-PDA, SVG-OM3   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/01/2023   Type 2 diabetes mellitus treated with insulin  (HCC)    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   Umbilical hernia    Unstable angina (HCC)    Vitamin D  deficiency    Past Surgical History:  Procedure Laterality Date   APPENDECTOMY     CARDIAC CATHETERIZATION Left 08/16/2016   Procedure: Left Heart Cath and Coronary Angiography;  Surgeon: Cara JONETTA Lovelace, MD;  Location: [facility] INVASIVE CV LAB;  Service: Cardiovascular;  Laterality: Left;   COLONOSCOPY N/A 09/07/2020   Procedure: COLONOSCOPY;  Surgeon: Janalyn Keene NOVAK, MD;  Location: [facility] ENDOSCOPY;  Service: Endoscopy;  Laterality: N/A;   COLONOSCOPY N/A 10/19/2023   Procedure: COLONOSCOPY;  Surgeon: Jinny Carmine, MD;  Location: [facility] ENDOSCOPY;  Service: Endoscopy;  Laterality: N/A;   COLONOSCOPY WITH PROPOFOL  N/A 05/17/2021   Procedure: COLONOSCOPY WITH PROPOFOL ;  Surgeon: Janalyn Keene NOVAK, MD;  Location: [facility] ENDOSCOPY;  Service: Endoscopy;  Laterality: N/A;   CORONARY ANGIOPLASTY WITH STENT PLACEMENT Left 11/26/2016   Procedure: CORONARY ANGIOPLASTY WITH STENT PLACEMENT; Location: [facility]; Surgeon: Alm Dais, MD   CORONARY ARTERY BYPASS GRAFT N/A 08/30/2016   Procedure: CORONARY ARTERY BYPASS GRAFT; Location: [facility]; Surgeon: Juliene Pouch, MD   CORONARY STENT INTERVENTION N/A 06/09/2020   Procedure: CORONARY STENT INTERVENTION;  Surgeon: Lovelace Cara JONETTA, MD;  Location: [facility] INVASIVE CV LAB;  Service: Cardiovascular;  Laterality: N/A;    CORONARY STENT INTERVENTION Left 12/17/2016   Procedure: CORONARY STENT INTERVENTION; Location: [facility]; Surgeon: Alm Dais, MD   ENDOSCOPIC MUCOSAL RESECTION N/A 09/22/2020   Procedure: ENDOSCOPIC MUCOSAL RESECTION;  Surgeon: [name] Aloha Raddle., MD;  Location: [facility] ENDOSCOPY;  Service: Gastroenterology;  Laterality: N/A;   ESOPHAGOGASTRODUODENOSCOPY N/A 09/07/2020   Procedure: ESOPHAGOGASTRODUODENOSCOPY (EGD);  Surgeon: Janalyn Keene NOVAK, MD;  Location: [facility] ENDOSCOPY;  Service: Endoscopy;  Laterality: N/A;   ESOPHAGOGASTRODUODENOSCOPY N/A 10/19/2023   Procedure: EGD (ESOPHAGOGASTRODUODENOSCOPY);  Surgeon: Jinny Carmine, MD;  Location: [facility] ENDOSCOPY;  Service: Endoscopy;  Laterality: N/A;   ESOPHAGOGASTRODUODENOSCOPY (EGD) WITH PROPOFOL  N/A 09/22/2020   Procedure: ESOPHAGOGASTRODUODENOSCOPY (EGD) WITH PROPOFOL ;  Surgeon: [name] Aloha Raddle., MD;  Location: [facility] ENDOSCOPY;  Service: Gastroenterology;  Laterality: N/A;   ESOPHAGOGASTRODUODENOSCOPY (EGD) WITH PROPOFOL  N/A 08/26/2023   Procedure: ESOPHAGOGASTRODUODENOSCOPY (EGD) WITH PROPOFOL ;  Surgeon: Jinny Carmine, MD;  Location: [facility] ENDOSCOPY;  Service:  Endoscopy;  Laterality: N/A;   FRACTURE SURGERY     HEMOSTASIS CLIP PLACEMENT  09/22/2020   Procedure: HEMOSTASIS CLIP PLACEMENT;  Surgeon: [name] Aloha Raddle., MD;  Location: [facility] ENDOSCOPY;  Service: Gastroenterology;;   HEMOSTASIS CONTROL  09/22/2020   Procedure: HEMOSTASIS CONTROL;  Surgeon: [name] Aloha Raddle., MD;  Location: [facility] ENDOSCOPY;  Service: Gastroenterology;;   INSERTION OF MESH  02/19/2023   Procedure: INSERTION OF MESH;  Surgeon: Desiderio Schanz, MD;  Location: [facility] ORS;  Service: General;;  umbilical   LAPAROSCOPIC PARTIAL RIGHT COLECTOMY Right 08/12/2012   Procedure: LAPAROSCOPIC PARTIAL RIGHT COLECTOMY; Location: [facility]; Surgeon: Unknown Sharps, MD   LEFT HEART CATH AND CORONARY ANGIOGRAPHY Left 06/09/2020   Procedure: LEFT HEART CATH AND CORONARY ANGIOGRAPHY;  Surgeon:  Florencio Cara BIRCH, MD;  Location: [facility] INVASIVE CV LAB;  Service: Cardiovascular;  Laterality: Left;   LEFT HEART CATH AND CORS/GRAFTS ANGIOGRAPHY Left 11/20/2016   Procedure: Left Heart Cath and Cors/Grafts Angiography;  Surgeon: Cara BIRCH Florencio, MD;  Location: [facility] INVASIVE CV LAB;  Service: Cardiovascular;  Laterality: Left;   POLYPECTOMY  09/22/2020   Procedure: POLYPECTOMY;  Surgeon: [name] Aloha Raddle., MD;  Location: [facility] ENDOSCOPY;  Service: Gastroenterology;;   POLYPECTOMY  10/19/2023   Procedure: POLYPECTOMY;  Surgeon: Jinny Carmine, MD;  Location: [facility] ENDOSCOPY;  Service: Endoscopy;;   SUBMUCOSAL LIFTING INJECTION  09/22/2020   Procedure: SUBMUCOSAL LIFTING INJECTION;  Surgeon: [name] Aloha Raddle., MD;  Location: [facility] ENDOSCOPY;  Service: Gastroenterology;;   UMBILICAL HERNIA REPAIR N/A 02/19/2023   Procedure: HERNIA REPAIR UMBILICAL ADULT, open;  Surgeon: Desiderio Schanz, MD;  Location: [facility] ORS;  Service: General;  Laterality: N/A;   XI ROBOTIC ASSISTED INGUINAL HERNIA REPAIR WITH MESH Right 02/19/2023   Procedure: XI ROBOTIC ASSISTED INGUINAL HERNIA REPAIR WITH MESH;  Surgeon: Desiderio Schanz, MD;  Location: [facility] ORS;  Service: General;  Laterality: Right;   Social History:  reports that he quit smoking about 25 years ago. His smoking use included cigarettes. He has never been exposed to tobacco smoke. He has never used smokeless tobacco. He reports that he does not drink alcohol and does not use drugs.  Allergies  Allergen Reactions   Isosorbide  Cough   Family History  Problem Relation Age of Onset   Hyperlipidemia Mother    Heart disease Mother    Hypertension Father    Family history: Family history reviewed and not pertinent.  Prior to Admission medications   Medication Sig Start Date End Date Taking? Authorizing Provider  acetaminophen  (TYLENOL ) 325 MG tablet Take 2 tablets (650 mg total) by mouth every 6 (six) hours as needed for mild pain (pain score 1-3), fever or  headache (or Fever >/= 101). 10/19/23  Yes Alexander, Natalie, DO  apixaban  (ELIQUIS ) 5 MG TABS tablet Take 5 mg by mouth.  Take 5 mg by mouth in the morning and 5 mg in the evening. 12/27/22  Yes [provider]  atorvastatin  (LIPITOR ) 80 MG tablet Take 80 mg by mouth.  Take 80 mg by mouth in the morning. 12/21/23 12/20/24 Yes [provider]  benzonatate  (TESSALON ) 100 MG capsule Take 100 mg by mouth. 12/10/23  Yes [provider]  carvedilol  (COREG ) 25 MG tablet :1 Tablet(s) By Mouth Every 12 Hours 01/12/24  Yes [provider]  carvedilol  (COREG ) 6.25 MG tablet Take 6.25 mg by mouth.  Take 6.25 mg by mouth in the morning and 6.25 mg in the evening. 02/20/24 02/19/25 Yes [provider]  cyanocobalamin  (VITAMIN B12) 1000 MCG  tablet Take 1,000 mcg by mouth daily.   Yes [provider]  dexlansoprazole (DEXILANT) 60 MG capsule Take 1 capsule by mouth daily. 12/25/21  Yes [provider]  empagliflozin  (JARDIANCE ) 10 MG TABS tablet Take 1 tablet (10 mg total) by mouth daily before breakfast. 08/29/23  Yes Agbata, Tochukwu, MD  Ferrous Sulfate  (IRON ) 325 (65 Fe) MG TABS Take 1 tablet by mouth daily.   Yes [provider]  hydrALAZINE  (APRESOLINE ) 50 MG tablet Take 1 tablet by mouth 3 (three) times daily. 05/10/21  Yes [provider]  lactulose  (CHRONULAC ) 10 GM/15ML solution Taking 15-20 cc by mouth 3 to 4 times a day for constiption 10/08/23  Yes Therisa Bi, MD  spironolactone  (ALDACTONE ) 25 MG tablet Take 25 mg by mouth in the morning. 12/21/23 12/20/24 Yes [provider]  Testosterone  1.62 % GEL Apply 2 Pump topically daily. One pump on each arm 07/22/23  Yes McGowan, Clotilda A, PA-C  torsemide  (DEMADEX ) 20 MG tablet Take 2 tablets (40 mg total) by mouth daily. 08/29/23  Yes Agbata, Tochukwu, MD  amLODipine  (NORVASC ) 5 MG tablet Take 5 mg by mouth 2 (two) times daily. Patient not taking: Reported on 10/28/2023 07/03/23  07/02/24  [provider]  aspirin  EC 81 MG tablet Take 81 mg by mouth.  Take 1 tablet (81 mg total) by mouth once daily for 1 day Patient not taking: Reported on 03/02/2024 01/25/24   [provider]  labetalol  (NORMODYNE ) 100 MG tablet Take 0.5 tablets (50 mg total) by mouth 2 (two) times daily. Patient not taking: Reported on 03/02/2024 10/19/23   Alexander, Natalie, DO  nitroGLYCERIN  (NITROSTAT ) 0.4 MG SL tablet Place 0.4 mg under the tongue every 5 (five) minutes as needed. Patient not taking: Reported on 03/02/2024 07/30/23 07/29/24  [provider]  rosuvastatin  (CRESTOR ) 40 MG tablet Take 40 mg by mouth daily. Patient not taking: Reported on 03/02/2024 06/22/20   [provider]  sitaGLIPtin (JANUVIA) 100 MG tablet Take 100 mg by mouth daily. Patient not taking: Reported on 03/02/2024    [provider]   Physical Exam: Vitals:   03/02/24 1306 03/02/24 1309 03/02/24 1600  BP: (!) 167/66  (!) 184/78  Pulse: 68  72  Resp: (!) 40  16  Temp: 98.1 F (36.7 C)    TempSrc: Oral    SpO2: 98%  97%  Weight:  88.9 kg   Height:  6' 0.5 (1.842 m)    Constitutional: appears stated age, frail, weak Eyes: PERRL, lids and conjunctivae normal ENMT: Mucous membranes are moist. Posterior pharynx clear of any exudate or lesions. Age-appropriate dentition. Hearing appropriate Neck: normal, supple, no masses, no thyromegaly Respiratory: Decreased lung sounds in the right lower lobe, no wheezing, no crackles. Normal respiratory effort. No accessory muscle use.  Cardiovascular: Regular rate and rhythm, no murmurs / rubs / gallops.  Bilateral lower extremity 2+ pitting edema. 2+ pedal pulses. No carotid bruits.  Abdomen: Obese abdomen, no tenderness, no masses palpated, no hepatosplenomegaly. Bowel sounds positive.  Musculoskeletal: no clubbing / cyanosis. No joint deformity upper and lower extremities. Good ROM, no contractures, no atrophy. Normal muscle tone.   Skin: no rashes, lesions, ulcers. No induration Neurologic: Sensation intact. Strength 5/5 in all 4.  Psychiatric: Normal judgment and insight. Alert and oriented x 3.  Depressed mood.   EKG: independently reviewed, showing atrial fibrillation with rate of 64, QTc 480, right bundle branch block  Chest x-ray on Admission: I personally reviewed and I  agree with radiologist reading as below.  DG Chest 2 View Result Date: 03/02/2024 CLINICAL DATA:  Shortness of breath EXAM: CHEST - 2 VIEW COMPARISON:  Chest x-ray performed October 28, 2023 FINDINGS: Moderate right-sided pleural effusion and small left pleural effusion. Enlarged heart. IMPRESSION: 1. Moderate right pleural effusion. 2. Small left pleural effusion. 3. Enlarged heart. Electronically Signed   By: Maude Naegeli M.D.   On: 03/02/2024 13:53   Labs on Admission: I have personally reviewed following labs  CBC: Recent Labs  Lab 03/02/24 1314  WBC 7.1  HGB 8.1*  HCT 26.0*  MCV 107.9*  PLT 129*   Basic Metabolic Panel: Recent Labs  Lab 03/02/24 1314  NA 139  K 5.2*  CL 106  CO2 24  GLUCOSE 184*  BUN 50*  CREATININE 2.82*  CALCIUM  10.2  MG 2.3   GFR: Estimated Creatinine Clearance: 24.8 mL/min (A) (by C-G formula based on SCr of 2.82 mg/dL (H)).  Coagulation Profile: Recent Labs  Lab 03/02/24 1314  INR 1.7*   Thyroid  Function Tests: Recent Labs    03/02/24 1314  TSH 2.630  FREET4 1.04   Anemia Panel: Recent Labs    03/02/24 1314  FERRITIN 311  TIBC 272  IRON  80   Urine analysis:    Component Value Date/Time   COLORURINE YELLOW (A) 08/27/2023 1000   APPEARANCEUR CLEAR (A) 08/27/2023 1000   LABSPEC 1.011 08/27/2023 1000   PHURINE 5.0 08/27/2023 1000   GLUCOSEU NEGATIVE 08/27/2023 1000   HGBUR SMALL (A) 08/27/2023 1000   BILIRUBINUR NEGATIVE 08/27/2023 1000   KETONESUR NEGATIVE 08/27/2023 1000   PROTEINUR 100 (A) 08/27/2023 1000   NITRITE NEGATIVE 08/27/2023 1000   LEUKOCYTESUR MODERATE (A)  08/27/2023 1000   This document was prepared using Dragon Voice Recognition software and may include unintentional dictation errors.  Dr. Sherre [facility]  If 7PM-7AM, please contact overnight-coverage provider If 7AM-7PM, please contact day attending provider [email or web address]  03/02/2024, 4:26 PM

## 2024-03-02 NOTE — Assessment & Plan Note (Signed)
 Query NSTEMI, cardiology has been consulted

## 2024-03-02 NOTE — ED Notes (Signed)
 Called CCMD to place patient on monitor

## 2024-03-02 NOTE — Progress Notes (Signed)
 Patient arrived on the unit. Walked to the bathroom. Sat on the bed while changing into Pajamas. Having a normal conversation with myself and CNA. He then fell back on the bed unresponsive, agonal breathing.  Sternal rub did not respond. VS HR 38, O2 sat 79%, BP 66/49. Rapid response called.  Patient continue unresponsive until bagged for approximately 2 minutes. He then became responsive.Confused at first then  Able to answer questions appropriately. HR 72, O2 Sat 100%, 114/66.

## 2024-03-02 NOTE — ED Triage Notes (Addendum)
 Pt presents with increased weakness and ShOB x 1 week. He developed L sided CP without radiation that started a few days ago. Pain is worsened with activity and resolves with rest. 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 Pt noted to have tachypnea during triage, but is able to speak in complete sentences. O2 2L started to assist work of breathing.

## 2024-03-02 NOTE — Assessment & Plan Note (Addendum)
 Patient has not missed any home Eliquis  dosing Home Eliquis  5 mg p.o. twice daily, carvedilol  6.25 mg p.o. twice daily with meals resumed

## 2024-03-02 NOTE — ED Notes (Signed)
 Lab called by this RN. Made aware of add on labs.

## 2024-03-02 NOTE — Assessment & Plan Note (Signed)
 Home oral antiglycemic agents will not be resumed on admission Insulin  SSI with at bedtime coverage ordered

## 2024-03-02 NOTE — Assessment & Plan Note (Signed)
 Mix with chronic kidney disease  Would recommend outpatient follow-up with nephrology as appropriate

## 2024-03-02 NOTE — Progress Notes (Signed)
 Patient being transferred to ICU/Stepdown.Report given.

## 2024-03-02 NOTE — ED Notes (Signed)
 Late sign off d/t multiple pinned admissions and transfers at end of shift

## 2024-03-02 NOTE — Assessment & Plan Note (Signed)
 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

## 2024-03-02 NOTE — Consult Note (Signed)
 [facility] CLINIC CARDIOLOGY CONSULT NOTE       Patient ID: [name] MRN: [id number] DOB/AGE: [birth year] 78 y.o.  Admit date: 03/02/2024 Referring Physician Dr. Greig Free Primary Physician Pcp, No Primary Cardiologist Dr. Lady Patel/ Dr. Nanci Blanch [facility]) Reason for Consultation chest pain, elevated trops  HPI: [name] is a 78 y.o. male  with a past medical history of chronic HFpEF, persistent atrial fibrillation, aortic stenosis s/p TAVR (01/2024), coronary artery disease s/p CABG x3, multiple coronary stents, most recent stent to ostial SVG in-stent restenosis (2021),  hypertension, hyperlipidemia, OSA, CKD, diabetes who presented to the ED on 03/02/2024 for chest pain and shortness of breath. Troponins found to be minimally elevated. Evening of 07/21 patient sudden became unresponsive with agonal breathing and did not respond to sternal rub, code blue was called. Patient was bagged for about 2 minutes, then became responsive with stable vitals. Lactic acid found to be elevated and patient transferred to stepdown unit. Cardiology was consulted for further evaluation.   Patient presented to the ED with chest pain and SOB. Work up in the ED notable for Na 139, K 5.2, Cr 2.82, Mg 2.3, GFR 22, Hgb 8.1, plts 129. BNP elevated at 1100. CXR with bilateral pleural effusions. EKG with atrial fibrillation, RBBB, rate 64 bpm. Trops minimally elevated and flat 35 > 36. Evening of 07/21 patient sudden became unresponsive with agonal breathing and did not respond to sternal rub, code blue was called. Patient was bagged for about 2 minutes, then became responsive with stable vitals. Lactic acid found to be elevated and patient transferred to stepdown unit. Trops drawn again after this event and troponins 85 > 102. Lactate 6.2 > 1.1.   At the time of my evaluation this AM, patient was resting comfortably in hospital bed.  We discussed patient's symptoms in further detail.  Patient states he has been  having a throbbing left-sided chest pain for the past 3 weeks.  Patient states it has been coming and going however it has been the worst for the last few days.  Patient states his throbbing chest pain is exacerbated with any exertion.  Patient states his chest pain can last 15 minutes to 3 hours.  Patient says prior to arrival to the ED he could not sleep the past 2 nights due to his throbbing chest pain.  Patient states it took him 3 weeks to come to the ED because he used to have the same pain prior to his TAVR however described that pain as sharp.  Patient states prior to his unresponsive event he had mild chest pain because he got up and walked to the bathroom.  Oertli patient denies any chest pain, SOB, palpitations, lightheadedness.  Pertinent Cardiac History (Most recent) LHC (05/2020) Normal left ventricular function 60% Coronaries - Left main large calcified moderate lesion moderate to large calcium  - Distal LAD large ostial lesion 75% moderate calcification proximal LAD stent widely patent mid LAD stent widely patent distal LAD diffuse 50% - Circumflex is large 70% ostial mid stent widely patent distal circumflex 95% TIMI-3 flow - RCA medium proximal 90% lesion 90% mid distal 75 - Grafts LIMA to LAD incompletely evaluated probably occluded - SVG to circumflex 100% occluded - SVG to PDA 95% ostial in-stent TIMI-3 flow Intervention Successful PCI and stent to in-stent restenosis ostially with 2.5 x 18 mm resolute Onyx to 20 atm Lesion reduced from 95 down to 0% TIMI-3 flow maintained throughout   Successful PCI and stent  ostial SVG in-stent restenosis. Lesion reduced from 95 down to 0%  Review of systems complete and found to be negative unless listed above    Past Medical History:  Diagnosis Date   (HFpEF) heart failure with preserved ejection fraction (HCC) 07/25/2016   a.)TTE 07/25/16: EF >55, triv-mild pan regur, mild AS, G2DD; b.)TTE 09/02/16: EF >55, triv TR, mild AS;  c.)TTE 11/05/16: EF >55, LAE, triv-mild pan regur, mild AS, G2DD; d.)TTE 01/21/20: EF >55, mod LVH, LAE/RVE, triv-mild AR/PR/TR, mod MR, mild AS, G2DD; e.)TTE 09/24/22: EF>55, sev LVH, sev LAE, RAE, mild PR, mod AR/MR/TR, mild-mod AS, RVSP 74.7; f.)TTE 12/23/22: EF 60-65, LAE, mild-mod MR/TR, mild AS   Abnormal urine 05/01/2023   Anemia    Aortic atherosclerosis (HCC)    Aortic stenosis 07/25/2016   a.) TTE 07/25/2016: mild (MPG 13.5); b.) TTE 09/02/2016: mild (MPG 16); c.) TTE 11/05/2016: mild (MPG 9); d.) TTE 01/21/2020: mild (MPG 11.7); e.) TTE 09/24/2022: mild-mod (MPG 20.3); f.) TTE 12/23/2022: mild (MPG 15)   Atrial fibrillation (HCC)    a.) CHA2DS2VASc = 6 (age x2, CHF, HTN, vascular disease history, T2DM);  b.) rate/rhythm maintained on oral labetalol ; chronically anticoagulated with apixaban  + clopidogrel    AV block, 1st degree    B12 deficiency    CKD (chronic kidney disease), stage III (HCC)    Colon polyps    Congestive heart failure (HCC) 05/01/2023   Coronary artery disease 08/16/2016   a.) s/p 3v CABG 08/30/2016; b.) s/p PCI 11/26/2016 (DES x 1 oSVG-RCA); c.) s/p PCI 12/17/2016 (DES x 5 --> mLCx x2, dLCx, pLAD, dLAD); c.) s/p PCI 06/09/2020 (DES x1 --> ISR oSVG-PDA)   Cyst of kidney, acquired 07/03/2023   Dyspnea    Gastroesophageal reflux disease 05/01/2023   GERD (gastroesophageal reflux disease)    Heart murmur    Hepatic steatosis    History of 2019 novel coronavirus disease (COVID-19) 09/02/2020   a.) Tx'd with remdesivir    History of blood transfusion    History of GI bleed 09/02/2020   a.) admitted to [facility] 09/02/2020 - 09/08/2020   History of heart artery stent 11/26/2016   TOTAL of 7 stents: a.) 11/26/2016 --> 3.5 x 22 mm Resolute Integrity oSVG-RCA; b.) 12/17/2016: 3.0 x 12 mm Xience Alpine dLCx, 3.5 x 22 mm Resolute Onyx mLCx, 3.5 x 18 mm Resolute Onyx mLCx, 3.0 x 26 mm Resolute Onyx pLAD, 2.0 x 26 mm Resolute Onyx dLAD; c.) 06/09/2020 --> 2.5 x 18 mm Resolute Onyx ISR  oSVG-PDA   History of partial colectomy 08/12/2012   a.) large gastric hyperplastic polyp removal   HLD (hyperlipidemia)    Hypertension    LBBB (left bundle branch block)    Long term current use of anticoagulant    a.) apixaban    Long term current use of antithrombotics/antiplatelets    a.) clopidogrel    Male hypogonadism    a.) on exogenous TRT (1.62% gel)   Mild cardiomegaly    Mild gynecomastia (bilateral)    Nephrolithiasis    OSA (obstructive sleep apnea)    a.) does not utilize nocturnal PAP therapy   Personal history of surgery to heart and great vessels, presenting hazards to health 05/01/2023   Proteinuria, unspecified 08/01/2023   Pulmonary hypertension (HCC) 09/24/2022   a.) TTE 09/24/2022: RVSP 74.7; b.) TTE 12/23/2022: RVSP 31.3   RBBB (right bundle branch block with left anterior fascicular block)    Right inguinal hernia    Right thyroid  nodule    S/P CABG x  3 08/30/2016   a.) LIMA-LAD, SVG-PDA, SVG-OM3   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/01/2023   Type 2 diabetes mellitus treated with insulin  (HCC)    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   Umbilical hernia    Unstable angina (HCC)    Vitamin D  deficiency     Past Surgical History:  Procedure Laterality Date   APPENDECTOMY     CARDIAC CATHETERIZATION Left 08/16/2016   Procedure: Left Heart Cath and Coronary Angiography;  Surgeon: Cara JONETTA Lovelace, MD;  Location: [facility] INVASIVE CV LAB;  Service: Cardiovascular;  Laterality: Left;   COLONOSCOPY N/A 09/07/2020   Procedure: COLONOSCOPY;  Surgeon: Janalyn Keene NOVAK, MD;  Location: [facility] ENDOSCOPY;  Service: Endoscopy;  Laterality: N/A;   COLONOSCOPY N/A 10/19/2023   Procedure: COLONOSCOPY;  Surgeon: Jinny Carmine, MD;  Location: [facility] ENDOSCOPY;  Service: Endoscopy;  Laterality: N/A;   COLONOSCOPY WITH PROPOFOL  N/A 05/17/2021   Procedure: COLONOSCOPY WITH PROPOFOL ;  Surgeon: Janalyn Keene NOVAK, MD;  Location: [facility]  ENDOSCOPY;  Service: Endoscopy;  Laterality: N/A;   CORONARY ANGIOPLASTY WITH STENT PLACEMENT Left 11/26/2016   Procedure: CORONARY ANGIOPLASTY WITH STENT PLACEMENT; Location: [facility]; Surgeon: Alm Dais, MD   CORONARY ARTERY BYPASS GRAFT N/A 08/30/2016   Procedure: CORONARY ARTERY BYPASS GRAFT; Location: [facility]; Surgeon: Juliene Pouch, MD   CORONARY STENT INTERVENTION N/A 06/09/2020   Procedure: CORONARY STENT INTERVENTION;  Surgeon: Lovelace Cara JONETTA, MD;  Location: [facility] INVASIVE CV LAB;  Service: Cardiovascular;  Laterality: N/A;   CORONARY STENT INTERVENTION Left 12/17/2016   Procedure: CORONARY STENT INTERVENTION; Location: [facility]; Surgeon: Alm Dais, MD   ENDOSCOPIC MUCOSAL RESECTION N/A 09/22/2020   Procedure: ENDOSCOPIC MUCOSAL RESECTION;  Surgeon: [name] Aloha Raddle., MD;  Location: [facility] ENDOSCOPY;  Service: Gastroenterology;  Laterality: N/A;   ESOPHAGOGASTRODUODENOSCOPY N/A 09/07/2020   Procedure: ESOPHAGOGASTRODUODENOSCOPY (EGD);  Surgeon: Janalyn Keene NOVAK, MD;  Location: [facility] ENDOSCOPY;  Service: Endoscopy;  Laterality: N/A;   ESOPHAGOGASTRODUODENOSCOPY N/A 10/19/2023   Procedure: EGD (ESOPHAGOGASTRODUODENOSCOPY);  Surgeon: Jinny Carmine, MD;  Location: [facility] ENDOSCOPY;  Service: Endoscopy;  Laterality: N/A;   ESOPHAGOGASTRODUODENOSCOPY (EGD) WITH PROPOFOL  N/A 09/22/2020   Procedure: ESOPHAGOGASTRODUODENOSCOPY (EGD) WITH PROPOFOL ;  Surgeon: [name] Aloha Raddle., MD;  Location: [facility] ENDOSCOPY;  Service: Gastroenterology;  Laterality: N/A;   ESOPHAGOGASTRODUODENOSCOPY (EGD) WITH PROPOFOL  N/A 08/26/2023   Procedure: ESOPHAGOGASTRODUODENOSCOPY (EGD) WITH PROPOFOL ;  Surgeon: Jinny Carmine, MD;  Location: [facility] ENDOSCOPY;  Service: Endoscopy;  Laterality: N/A;   FRACTURE SURGERY     HEMOSTASIS CLIP PLACEMENT  09/22/2020   Procedure: HEMOSTASIS CLIP PLACEMENT;  Surgeon: [name] Aloha Raddle., MD;  Location: [facility] ENDOSCOPY;  Service: Gastroenterology;;   HEMOSTASIS CONTROL  09/22/2020    Procedure: HEMOSTASIS CONTROL;  Surgeon: [name] Aloha Raddle., MD;  Location: [facility] ENDOSCOPY;  Service: Gastroenterology;;   INSERTION OF MESH  02/19/2023   Procedure: INSERTION OF MESH;  Surgeon: Desiderio Schanz, MD;  Location: [facility] ORS;  Service: General;;  umbilical   LAPAROSCOPIC PARTIAL RIGHT COLECTOMY Right 08/12/2012   Procedure: LAPAROSCOPIC PARTIAL RIGHT COLECTOMY; Location: [facility]; Surgeon: Unknown Sharps, MD   LEFT HEART CATH AND CORONARY ANGIOGRAPHY Left 06/09/2020   Procedure: LEFT HEART CATH AND CORONARY ANGIOGRAPHY;  Surgeon: Lovelace Cara JONETTA, MD;  Location: [facility] INVASIVE CV LAB;  Service: Cardiovascular;  Laterality: Left;   LEFT HEART CATH AND CORS/GRAFTS ANGIOGRAPHY Left 11/20/2016   Procedure: Left Heart Cath and Cors/Grafts Angiography;  Surgeon: Cara JONETTA Lovelace, MD;  Location: [facility] INVASIVE CV LAB;  Service: Cardiovascular;  Laterality: Left;  POLYPECTOMY  09/22/2020   Procedure: POLYPECTOMY;  Surgeon: Mansouraty, Aloha Raddle., MD;  Location: [facility] ENDOSCOPY;  Service: Gastroenterology;;   POLYPECTOMY  10/19/2023   Procedure: POLYPECTOMY;  Surgeon: Jinny Carmine, MD;  Location: [facility] ENDOSCOPY;  Service: Endoscopy;;   SUBMUCOSAL LIFTING INJECTION  09/22/2020   Procedure: SUBMUCOSAL LIFTING INJECTION;  Surgeon: [name] Aloha Raddle., MD;  Location: [facility] ENDOSCOPY;  Service: Gastroenterology;;   UMBILICAL HERNIA REPAIR N/A 02/19/2023   Procedure: HERNIA REPAIR UMBILICAL ADULT, open;  Surgeon: Desiderio Schanz, MD;  Location: [facility] ORS;  Service: General;  Laterality: N/A;   XI ROBOTIC ASSISTED INGUINAL HERNIA REPAIR WITH MESH Right 02/19/2023   Procedure: XI ROBOTIC ASSISTED INGUINAL HERNIA REPAIR WITH MESH;  Surgeon: Desiderio Schanz, MD;  Location: [facility] ORS;  Service: General;  Laterality: Right;    Medications Prior to Admission  Medication Sig Dispense Refill Last Dose/Taking   acetaminophen  (TYLENOL ) 325 MG tablet Take 2 tablets (650 mg total) by mouth every 6 (six) hours as needed for mild  pain (pain score 1-3), fever or headache (or Fever >/= 101).   Unknown   apixaban  (ELIQUIS ) 5 MG TABS tablet Take 5 mg by mouth.  Take 5 mg by mouth in the morning and 5 mg in the evening.   03/02/2024 at  7:00 AM   atorvastatin  (LIPITOR ) 80 MG tablet Take 80 mg by mouth.  Take 80 mg by mouth in the morning.   03/02/2024 Morning   benzonatate  (TESSALON ) 100 MG capsule Take 100 mg by mouth.   Taking   carvedilol  (COREG ) 25 MG tablet :1 Tablet(s) By Mouth Every 12 Hours   Taking   carvedilol  (COREG ) 6.25 MG tablet Take 6.25 mg by mouth.  Take 6.25 mg by mouth in the morning and 6.25 mg in the evening.   03/02/2024 Morning   cyanocobalamin  (VITAMIN B12) 1000 MCG tablet Take 1,000 mcg by mouth daily.   03/02/2024 Morning   dexlansoprazole (DEXILANT) 60 MG capsule Take 1 capsule by mouth daily.   03/02/2024 Morning   empagliflozin  (JARDIANCE ) 10 MG TABS tablet Take 1 tablet (10 mg total) by mouth daily before breakfast. 30 tablet 0 03/02/2024 Morning   Ferrous Sulfate  (IRON ) 325 (65 Fe) MG TABS Take 1 tablet by mouth daily.   03/02/2024 Morning   [Paused] hydrALAZINE  (APRESOLINE ) 50 MG tablet Take 1 tablet by mouth 3 (three) times daily.   03/02/2024 Morning   lactulose  (CHRONULAC ) 10 GM/15ML solution Taking 15-20 cc by mouth 3 to 4 times a day for constiption 946 mL 2 Unknown   spironolactone  (ALDACTONE ) 25 MG tablet Take 25 mg by mouth in the morning.   03/02/2024 Morning   Testosterone  1.62 % GEL Apply 2 Pump topically daily. One pump on each arm 225 g 3 03/02/2024 Morning   torsemide  (DEMADEX ) 20 MG tablet Take 2 tablets (40 mg total) by mouth daily. 60 tablet 0 03/02/2024 Morning   [Paused] amLODipine  (NORVASC ) 5 MG tablet Take 5 mg by mouth 2 (two) times daily. (Patient not taking: Reported on 10/28/2023)   Not Taking   aspirin  EC 81 MG tablet Take 81 mg by mouth.  Take 1 tablet (81 mg total) by mouth once daily for 1 day (Patient not taking: Reported on 03/02/2024)   Not Taking   labetalol  (NORMODYNE ) 100 MG  tablet Take 0.5 tablets (50 mg total) by mouth 2 (two) times daily. (Patient not taking: Reported on 03/02/2024)   Not Taking   nitroGLYCERIN  (NITROSTAT ) 0.4 MG SL tablet Place 0.4 mg under  the tongue every 5 (five) minutes as needed. (Patient not taking: Reported on 03/02/2024)   Not Taking   rosuvastatin  (CRESTOR ) 40 MG tablet Take 40 mg by mouth daily. (Patient not taking: Reported on 03/02/2024)   Not Taking   sitaGLIPtin (JANUVIA) 100 MG tablet Take 100 mg by mouth daily. (Patient not taking: Reported on 03/02/2024)   Not Taking   Social History   Socioeconomic History   Marital status: Married    Spouse name: Not on file   Number of children: Not on file   Years of education: Not on file   Highest education level: Not on file  Occupational History   Not on file  Tobacco Use   Smoking status: Former    Current packs/day: 0.00    Types: Cigarettes    Quit date: 2000    Years since quitting: 25.5    Passive exposure: Never   Smokeless tobacco: Never  Vaping Use   Vaping status: Never Used  Substance and Sexual Activity   Alcohol use: No   Drug use: No   Sexual activity: Not Currently  Other Topics Concern   Not on file  Social History Narrative   Not on file   Social Drivers of Health   Financial Resource Strain: Low Risk  (12/12/2023)   Received from [facility]   Overall Financial Resource Strain (CARDIA)    Difficulty of Paying Living Expenses: Not hard at all  Food Insecurity: No Food Insecurity (03/02/2024)   Hunger Vital Sign    Worried About Running Out of Food in the Last Year: Never true    Ran Out of Food in the Last Year: Never true  Transportation Needs: No Transportation Needs (03/02/2024)   PRAPARE - Administrator, Civil Service (Medical): No    Lack of Transportation (Non-Medical): No  Physical Activity: Not on file  Stress: Not on file  Social Connections: Patient Declined (03/02/2024)   Social Connection and Isolation Panel     Frequency of Communication with Friends and Family: Patient declined    Frequency of Social Gatherings with Friends and Family: Patient declined    Attends Religious Services: Patient declined    Active Member of Clubs or Organizations: Patient declined    Attends Banker Meetings: Patient declined    Marital Status: Patient declined  Intimate Partner Violence: Not At Risk (03/02/2024)   Humiliation, Afraid, Rape, and Kick questionnaire    Fear of Current or Ex-Partner: No    Emotionally Abused: No    Physically Abused: No    Sexually Abused: No    Family History  Problem Relation Age of Onset   Hyperlipidemia Mother    Heart disease Mother    Hypertension Father      Vitals:   03/03/24 0300 03/03/24 0400 03/03/24 0500 03/03/24 0600  BP: 134/68 (!) 157/70 136/63 (!) 155/68  Pulse: 63 69 60 (!) 58  Resp: 10 18 13 12   Temp:  98.3 F (36.8 C)    TempSrc:      SpO2: 93% 100% 100% 100%  Weight:      Height:        PHYSICAL EXAM General: Well-appearing elderly male, well nourished, in no acute distress. HEENT: Normocephalic and atraumatic. Neck: No JVD.   Lungs: Normal respiratory effort on 3L. Clear bilaterally to auscultation. No wheezes, crackles, rhonchi.  Heart: HRRR. Normal S1 and S2 without gallops or murmurs.  Abdomen: Non-distended appearing.  Msk: Normal strength  and tone for age. Extremities: Warm and well perfused. No clubbing, cyanosis, edema.  Neuro: Alert and oriented X 3. Psych: Answers questions appropriately.   Labs: Basic Metabolic Panel: Recent Labs    03/02/24 1314 03/02/24 2154 03/03/24 0330  NA 139 139 141  K 5.2* 5.3* 5.1  CL 106 105 107  CO2 24 19* 25  GLUCOSE 184* 248* 143*  BUN 50* 50* 50*  CREATININE 2.82* 2.90* 2.71*  CALCIUM  10.2 10.5* 10.2  MG 2.3 2.5*  --    Liver Function Tests: No results for input(s): AST, ALT, ALKPHOS, BILITOT, PROT, ALBUMIN in the last 72 hours. No results for input(s):  LIPASE, AMYLASE in the last 72 hours. CBC: Recent Labs    03/02/24 2154 03/03/24 0330  WBC 8.1 5.9  HGB 8.9* 7.3*  HCT 28.4* 23.8*  MCV 109.2* 108.2*  PLT 113* 72*   Cardiac Enzymes: Recent Labs    03/02/24 1504 03/02/24 2154 03/03/24 0023  TROPONINIHS 36* 85* 102*   BNP: Recent Labs    03/02/24 1314  BNP 1,183.2*   D-Dimer: No results for input(s): DDIMER in the last 72 hours. Hemoglobin A1C: No results for input(s): HGBA1C in the last 72 hours. Fasting Lipid Panel: No results for input(s): CHOL, HDL, LDLCALC, TRIG, CHOLHDL, LDLDIRECT in the last 72 hours. Thyroid  Function Tests: Recent Labs    03/02/24 1314  TSH 2.630   Anemia Panel: Recent Labs    03/02/24 1314  FERRITIN 311  TIBC 272  IRON  80     Radiology: DG Chest 2 View Result Date: 03/02/2024 CLINICAL DATA:  Shortness of breath EXAM: CHEST - 2 VIEW COMPARISON:  Chest x-ray performed October 28, 2023 FINDINGS: Moderate right-sided pleural effusion and small left pleural effusion. Enlarged heart. IMPRESSION: 1. Moderate right pleural effusion. 2. Small left pleural effusion. 3. Enlarged heart. Electronically Signed   By: Maude Naegeli M.D.   On: 03/02/2024 13:53    ECHO 01/24/2024  NORMAL LEFT VENTRICULAR SYSTOLIC FUNCTION WITH MODERATE LVH  ESTIMATED EF: >55%, CALC EF(3D): 59%  DIASTOLIC FUNCTION CAN'T BE DETERMINED  MILD RIGHT VENTRICULAR SYSTOLIC DYSFUNCTION  VALVULAR REGURGITATION: MILD AR, MILD MR, TRIVIAL PR, MILD TR  ESTIMATED RVSP: 70 mmHg  VALVULAR STENOSIS: No AS, MILD MS, No PS, No TS   TELEMETRY reviewed by me 03/03/2024: Atrial fibrillation 50-60s  EKG reviewed by me: atrial fibrillation, RBBB, rate 64 bpm.  Data reviewed by me 03/03/2024: last 24h vitals tele labs imaging I/O ED provider note, admission H&P.  Principal Problem:   Chest pain Active Problems:   BBB (bundle branch block)   Essential hypertension   OSA (obstructive sleep apnea)   S/P CABG x 3    CAD (coronary artery disease), native coronary artery   Other hyperlipidemia   S/P drug eluting coronary stent placement   Iron  deficiency anemia   Paroxysmal atrial fibrillation (HCC)   Dyspnea on exertion   Diabetes mellitus type 2, noninsulin dependent (HCC)   CKD (chronic kidney disease), stage IV (HCC)   Respiratory arrest (HCC)   Unresponsiveness   Pleural effusion    ASSESSMENT AND PLAN:  [name] is a 78 y.o. male  with a past medical history of chronic HFpEF, persistent atrial fibrillation, aortic stenosis s/p TAVR (01/2024), coronary artery disease s/p CABG x3, multiple coronary stents, most recent stent to ostial SVG in-stent restenosis (2021),  hypertension, hyperlipidemia, OSA, CKD, diabetes who presented to the ED on 03/02/2024 for chest pain and shortness of breath. Troponins found  to be minimally elevated. Evening of 07/21 patient sudden became unresponsive with agonal breathing and did not respond to sternal rub, code blue was called. Patient was bagged for about 2 minutes, then became responsive with stable vitals. Lactic acid found to be elevated and patient transferred to stepdown unit. Cardiology was consulted for further evaluation.   # Acute respiratory failure with hypoxia Patient responded after about 2 minutes of bag valve assisted ventilations. Trops drawn again after this event and troponins 85 > 102>97 > 90. Lactate 6.2 > 1.1.  -Echo ordered. -Blood cultures pending.  -IV antibiotics started.  # CAD s/p CABG x3, multiple coronary stents # Hypertension  # Hyperlipidemia  # Likely demand ischemia Patient presents with chest pain, SOB. EKG without acute ischemic changes. Trops minimally elevated and flat 35 > 36. Trops drawn after unresponsive event 85 > 102>97 > 90.. With lactic of 6.2.  -Continue home atorvastatin  80 mg daily.  -Continue coreg  as stated below.  -Resume home amlodipine  5 mg BID. -Will resume home hydralazine  50 mg TID if BP remains  elevated.   - Will likely plan for LHC however patient's renal function is elevated.  Creatinine is 2.7, which is around baseline. LHC requires contrast, want to be cautious of causing worsening renal function. Awaiting echo to be performed as well.  # Aortic Stenosis s/p TAVR (01/2024) Echo s/p TAVR on 01/2024 with pEF, moderate LVH, no AS.  -Echo ordered. -Will schedule close outpatient cardiology follow-up.   # Acute on chronic HFpEF # CKD stage IV BNP elevated at 1100. CXR with bilateral pleural effusions. Patient home dose diuretic is torsemide  20 mg M/W/S. Patient without SOB or LEE. -Ordered IV lasix  40 mg BID.  Closely monitor UOP and renal function. -Resume home empagliflozin  10 mg daily.  -Hold off on resuming home spironolactone , GFR < 30.  -Continue home Coreg  6.25 mg BID.   # Persistent atrial fibrillation EKG with atrial fibrillation, RBBB, rate 64 bpm. Per tele rate controlled atrial fibrillation, 50-60s. -Continue home Eliquis  for stroke risk reduction.  -Continue Coreg  as stated above.   This patient's plan of care was discussed and created with Dr. Ammon and he is in agreement.  Signed: Dorene Comfort, PA-C  03/03/2024, 6:43 AM [facility]

## 2024-03-02 NOTE — ED Provider Notes (Signed)
 [facility] Provider Note    Event Date/Time   First MD Initiated Contact with Patient 03/02/24 1504     (approximate)   History   No chief complaint on file.   HPI  [name] is a 78 y.o. 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  He states that the chest pain is constant.  Denies any syncopal episodes.  He does not use any oxygen at baseline.  He has not had a cough or any measured fevers.  Denies any abdominal pain.  He has not had any lower extremity edema.  He is compliant with all of his medications.  Reports that he had outside labs this week which demonstrated that he had low iron  level of which she has required iron  transfusions in the past, however his physician who normally ranges this is on vacation and he was unable to get this done as an outpatient.  He presents with his wife who contributes to the history      Physical Exam   Triage Vital Signs: ED Triage Vitals  Encounter Vitals Group     BP 03/02/24 1306 (!) 167/66     Girls Systolic BP Percentile --      Girls Diastolic BP Percentile --      Boys Systolic BP Percentile --      Boys Diastolic BP Percentile --      Pulse Rate 03/02/24 1306 68     Resp 03/02/24 1306 (!) 40     Temp 03/02/24 1306 98.1 F (36.7 C)     Temp Source 03/02/24 1306 Oral     SpO2 03/02/24 1306 98 %     Weight 03/02/24 1309 196 lb (88.9 kg)     Height 03/02/24 1309 6' 0.5 (1.842 m)     Head Circumference --      Peak Flow --      Pain Score 03/02/24 1308 0     Pain Loc --      Pain Education --      Exclude from Growth Chart --     Most recent vital signs: Vitals:   03/02/24 1306  BP: (!) 167/66  Pulse: 68   Resp: (!) 40  Temp: 98.1 F (36.7 C)  SpO2: 98%    Nursing Triage Note reviewed. Vital signs reviewed and patients oxygen saturation is hypoxic  General: Patient is well nourished, well developed, awake and alert, Head: Normocephalic and atraumatic Eyes: Normal inspection, extraocular muscles intact, no conjunctival pallor Ear, nose, throat: Normal external exam Neck: Normal range of motion Respiratory: Patient is in no respiratory distress, lungs with rales Cardiovascular: Patient is no tachycardic, irregularly irregular GI: Abd SNT with no guarding or rebound  Back: Normal inspection of the back with good strength and range of motion throughout all ext Extremities: pulses intact with good cap refills, no LE pitting edema or calf tenderness, chronic skin changes Neuro: The patient is alert and oriented to person, place, and time, appropriately conversive, with 5/5 bilat UE/LE strength, no gross motor or sensory defects noted. Coordination appears to be adequate. Skin: Warm, dry, and intact Psych: normal mood and affect, no SI or HI  ED Results / Procedures / Treatments  Labs (all labs ordered are listed, but only abnormal results are displayed) Labs Reviewed  BASIC METABOLIC PANEL WITH GFR - Abnormal; Notable for the following components:      Result Value   Potassium 5.2 (*)    Glucose, Bld 184 (*)    BUN 50 (*)    Creatinine, Ser 2.82 (*)    GFR, Estimated 22 (*)    All other components within normal limits  CBC - Abnormal; Notable for the following components:   RBC 2.41 (*)    Hemoglobin 8.1 (*)    HCT 26.0 (*)    MCV 107.9 (*)    RDW 20.6 (*)    Platelets 129 (*)    All other components within normal limits  PROTIME-INR - Abnormal; Notable for the following components:   Prothrombin Time 20.4 (*)    INR 1.7 (*)    All other components within normal limits  TROPONIN I (HIGH SENSITIVITY) - Abnormal; Notable for the following components:   Troponin I (High  Sensitivity) 35 (*)    All other components within normal limits  RESP PANEL BY RT-PCR (RSV, FLU A&B, COVID)  RVPGX2  BRAIN NATRIURETIC PEPTIDE  TSH  T4, FREE  PROCALCITONIN  MAGNESIUM   IRON  AND TIBC  FERRITIN  URINALYSIS, COMPLETE (UACMP) WITH MICROSCOPIC  TYPE AND SCREEN  TROPONIN I (HIGH SENSITIVITY)     EKG EKG and rhythm strip are interpreted by myself:   EKG: afib at heart rate of 64, wide QRS duration, QTc 480, nonspecific ST segments and T waves no ectopy EKG not consistent with Acute STEMI Rhythm strip: afibin lead II   RADIOLOGY Xray: Pleural effusions on my independent review and interpretation radiologist agrees   PROCEDURES:  Critical Care performed: No  Procedures   MEDICATIONS ORDERED IN ED: Medications - No data to display   IMPRESSION / MDM / ASSESSMENT AND PLAN / ED COURSE                                Differential diagnosis includes, but is not limited to, CHF exacerbation pneumonia, arrhythmia, anemia, ACS  ED course: Patient appears deconditioned and is requiring 2 L of oxygen when he is not on any at baseline.  Chest x-ray does demonstrate a large right pleural effusion.  His troponin is only mildly elevated and EKG is not consistent with acute ischemia today.  A BNP is pending.  His hematocrit is 26 which is his baseline but I have added on labs for iron  which are currently pending.  He will require admission today   Clinical Course as of 03/02/24 1549  Mon Mar 02, 2024  1504 Potassium(!): 5.2 Mildly elevated [HD]  1504 Creatinine(!): 2.82 At patient's baseline [HD]  1504 WBC: 7.1 No leukocytosis [HD]  1505 HCT(!): 26.0 At patient's baseline [HD]  1505 Troponin I (High Sensitivity)(!): 35 [HD]  1505 Troponin I (High Sensitivity)(!): 35 Slightly more elevated than usual [HD]  1505 DG Chest 2 View . Moderate right pleural effusion. 2. Small left pleural effusion. 3. Enlarged heart. [HD]  1548 Hospitalist at bedside and accepts  admission [HD]    Clinical Course User Index [HD] Nicholaus Rolland BRAVO, MD   Data(2/3 categories following were performed): I reviewed or ordered at least three unique tests, external notes, and/or the history required an independent historian as one of the three requirements as following: At least 3 labs/imaging studies were obtained and/or reviewed. AND  I discussed the management of the patient with the following external physician or qualified healthcare provider: Admitting physician  Risk: This patient has a high risk of morbidity due to further diagnostic testing or treatment. Rationale: Decision made regarding admission  Admit Level 5 - Labs/Rads, Admit, Consult:  Suggested E/M Coding Level: 5, 99285  This level has been selected based on the 03-23-22 CPT guidelines for E/M codes in the Emergency Department based on 2/3 of the CoPA, Data, and Risk.   FINAL CLINICAL IMPRESSION(S) / ED DIAGNOSES   Final diagnoses:  Hypoxia  Pleural effusion  Longstanding persistent atrial fibrillation (HCC)  Chronic anemia     Rx / DC Orders   ED Discharge Orders     None        Note:  This document was prepared using Dragon voice recognition software and may include unintentional dictation errors.   Nicholaus Rolland BRAVO, MD 03/02/24 [phone]

## 2024-03-02 NOTE — Assessment & Plan Note (Addendum)
 Hydralazine 5 mg IV every 6 hours as needed for SBP > 170, 5 days ordered

## 2024-03-02 NOTE — Assessment & Plan Note (Addendum)
 Patient had complete echo last on 01/24/2024: Estimated ejection fraction is greater than 55%, calculated ejection fraction is 59%, normal left ventricular systolic function with moderate LVH no AAS, mild MS, no PS, no TS.  Estimated RVSP is 70 mmHg Patient has not missed Eliquis  dosing at home and endorses compliance with all home medications Cardiology, [facility] clinic service has been consulted via Epic order and secure chat

## 2024-03-02 NOTE — ED Provider Notes (Signed)
 [facility] Department of Emergency Medicine   Code Blue CONSULT NOTE  Chief Complaint: Cardiac arrest/unresponsive   Level V Caveat: Unresponsive  History of present illness: I was contacted by the hospital for a CODE BLUE cardiac arrest upstairs and presented to the patient's bedside.    ROS: Unable to obtain, Level V caveat  Scheduled Meds:  apixaban   5 mg Oral BID   [START ON 03/03/2024] atorvastatin   80 mg Oral Daily   carvedilol   6.25 mg Oral BID WC   [START ON 03/03/2024] cyanocobalamin   1,000 mcg Oral Daily   [START ON 03/03/2024] ferrous sulfate   325 mg Oral Daily   insulin  aspart  0-5 Units Subcutaneous QHS   insulin  aspart  0-9 Units Subcutaneous TID WC   [START ON 03/03/2024] pantoprazole   40 mg Oral Daily   Continuous Infusions: PRN Meds:.acetaminophen  **OR** acetaminophen , benzonatate , hydrALAZINE , lactulose , nitroGLYCERIN , ondansetron  **OR** ondansetron  (ZOFRAN ) IV, senna-docusate Past Medical History:  Diagnosis Date   (HFpEF) heart failure with preserved ejection fraction (HCC) 07/25/2016   a.)TTE 07/25/16: EF >55, triv-mild pan regur, mild AS, G2DD; b.)TTE 09/02/16: EF >55, triv TR, mild AS; c.)TTE 11/05/16: EF >55, LAE, triv-mild pan regur, mild AS, G2DD; d.)TTE 01/21/20: EF >55, mod LVH, LAE/RVE, triv-mild AR/PR/TR, mod MR, mild AS, G2DD; e.)TTE 09/24/22: EF>55, sev LVH, sev LAE, RAE, mild PR, mod AR/MR/TR, mild-mod AS, RVSP 74.7; f.)TTE 12/23/22: EF 60-65, LAE, mild-mod MR/TR, mild AS   Abnormal urine 05/01/2023   Anemia    Aortic atherosclerosis (HCC)    Aortic stenosis 07/25/2016   a.) TTE 07/25/2016: mild (MPG 13.5); b.) TTE 09/02/2016: mild (MPG 16); c.) TTE 11/05/2016: mild (MPG 9); d.) TTE 01/21/2020: mild (MPG 11.7); e.) TTE 09/24/2022: mild-mod (MPG 20.3); f.) TTE 12/23/2022: mild (MPG 15)   Atrial fibrillation (HCC)    a.) CHA2DS2VASc = 6 (age x2, CHF, HTN, vascular disease history, T2DM);  b.) rate/rhythm maintained on oral labetalol ;  chronically anticoagulated with apixaban  + clopidogrel    AV block, 1st degree    B12 deficiency    CKD (chronic kidney disease), stage III (HCC)    Colon polyps    Congestive heart failure (HCC) 05/01/2023   Coronary artery disease 08/16/2016   a.) s/p 3v CABG 08/30/2016; b.) s/p PCI 11/26/2016 (DES x 1 oSVG-RCA); c.) s/p PCI 12/17/2016 (DES x 5 --> mLCx x2, dLCx, pLAD, dLAD); c.) s/p PCI 06/09/2020 (DES x1 --> ISR oSVG-PDA)   Cyst of kidney, acquired 07/03/2023   Dyspnea    Gastroesophageal reflux disease 05/01/2023   GERD (gastroesophageal reflux disease)    Heart murmur    Hepatic steatosis    History of 2019 novel coronavirus disease (COVID-19) 09/02/2020   a.) Tx'd with remdesivir    History of blood transfusion    History of GI bleed 09/02/2020   a.) admitted to [facility] 09/02/2020 - 09/08/2020   History of heart artery stent 11/26/2016   TOTAL of 7 stents: a.) 11/26/2016 --> 3.5 x 22 mm Resolute Integrity oSVG-RCA; b.) 12/17/2016: 3.0 x 12 mm Xience Alpine dLCx, 3.5 x 22 mm Resolute Onyx mLCx, 3.5 x 18 mm Resolute Onyx mLCx, 3.0 x 26 mm Resolute Onyx pLAD, 2.0 x 26 mm Resolute Onyx dLAD; c.) 06/09/2020 --> 2.5 x 18 mm Resolute Onyx ISR oSVG-PDA   History of partial colectomy 08/12/2012   a.) large gastric hyperplastic polyp removal   HLD (hyperlipidemia)    Hypertension    LBBB (left bundle branch block)    Long term current use of  anticoagulant    a.) apixaban    Long term current use of antithrombotics/antiplatelets    a.) clopidogrel    Male hypogonadism    a.) on exogenous TRT (1.62% gel)   Mild cardiomegaly    Mild gynecomastia (bilateral)    Nephrolithiasis    OSA (obstructive sleep apnea)    a.) does not utilize nocturnal PAP therapy   Personal history of surgery to heart and great vessels, presenting hazards to health 05/01/2023   Proteinuria, unspecified 08/01/2023   Pulmonary hypertension (HCC) 09/24/2022   a.) TTE 09/24/2022: RVSP 74.7; b.) TTE 12/23/2022: RVSP 31.3    RBBB (right bundle branch block with left anterior fascicular block)    Right inguinal hernia    Right thyroid  nodule    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   a.) LIMA-LAD, SVG-PDA, SVG-OM3   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/01/2023   Type 2 diabetes mellitus treated with insulin  (HCC)    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   Umbilical hernia    Unstable angina (HCC)    Vitamin D  deficiency    Past Surgical History:  Procedure Laterality Date   APPENDECTOMY     CARDIAC CATHETERIZATION Left 08/16/2016   Procedure: Left Heart Cath and Coronary Angiography;  Surgeon: Cara JONETTA Lovelace, MD;  Location: [facility] INVASIVE CV LAB;  Service: Cardiovascular;  Laterality: Left;   COLONOSCOPY N/A 09/07/2020   Procedure: COLONOSCOPY;  Surgeon: Janalyn Keene NOVAK, MD;  Location: [facility] ENDOSCOPY;  Service: Endoscopy;  Laterality: N/A;   COLONOSCOPY N/A 10/19/2023   Procedure: COLONOSCOPY;  Surgeon: Jinny Carmine, MD;  Location: [facility] ENDOSCOPY;  Service: Endoscopy;  Laterality: N/A;   COLONOSCOPY WITH PROPOFOL  N/A 05/17/2021   Procedure: COLONOSCOPY WITH PROPOFOL ;  Surgeon: Janalyn Keene NOVAK, MD;  Location: [facility] ENDOSCOPY;  Service: Endoscopy;  Laterality: N/A;   CORONARY ANGIOPLASTY WITH STENT PLACEMENT Left 11/26/2016   Procedure: CORONARY ANGIOPLASTY WITH STENT PLACEMENT; Location: [facility]; Surgeon: Alm Dais, MD   CORONARY ARTERY BYPASS GRAFT N/A 08/30/2016   Procedure: CORONARY ARTERY BYPASS GRAFT; Location: [facility]; Surgeon: Juliene Pouch, MD   CORONARY STENT INTERVENTION N/A 06/09/2020   Procedure: CORONARY STENT INTERVENTION;  Surgeon: Lovelace Cara JONETTA, MD;  Location: [facility];  Service: Cardiovascular;  Laterality: N/A;   CORONARY STENT INTERVENTION Left 12/17/2016   Procedure: CORONARY STENT INTERVENTION; Location: [facility]; Surgeon: Alm Dais, MD   ENDOSCOPIC MUCOSAL RESECTION N/A 09/22/2020   Procedure: ENDOSCOPIC MUCOSAL RESECTION;  Surgeon:  [name] Aloha Raddle., MD;  Location: [facility] ENDOSCOPY;  Service: Gastroenterology;  Laterality: N/A;   ESOPHAGOGASTRODUODENOSCOPY N/A 09/07/2020   Procedure: ESOPHAGOGASTRODUODENOSCOPY (EGD);  Surgeon: Janalyn Keene NOVAK, MD;  Location: [facility] ENDOSCOPY;  Service: Endoscopy;  Laterality: N/A;   ESOPHAGOGASTRODUODENOSCOPY N/A 10/19/2023   Procedure: EGD (ESOPHAGOGASTRODUODENOSCOPY);  Surgeon: Jinny Carmine, MD;  Location: [facility] ENDOSCOPY;  Service: Endoscopy;  Laterality: N/A;   ESOPHAGOGASTRODUODENOSCOPY (EGD) WITH PROPOFOL  N/A 09/22/2020   Procedure: ESOPHAGOGASTRODUODENOSCOPY (EGD) WITH PROPOFOL ;  Surgeon: [name] Aloha Raddle., MD;  Location: [facility] ENDOSCOPY;  Service: Gastroenterology;  Laterality: N/A;   ESOPHAGOGASTRODUODENOSCOPY (EGD) WITH PROPOFOL  N/A 08/26/2023   Procedure: ESOPHAGOGASTRODUODENOSCOPY (EGD) WITH PROPOFOL ;  Surgeon: Jinny Carmine, MD;  Location: [facility] ENDOSCOPY;  Service: Endoscopy;  Laterality: N/A;   FRACTURE SURGERY     HEMOSTASIS CLIP PLACEMENT  09/22/2020   Procedure: HEMOSTASIS CLIP PLACEMENT;  Surgeon: [name] Aloha Raddle., MD;  Location: [facility] ENDOSCOPY;  Service: Gastroenterology;;   HEMOSTASIS CONTROL  09/22/2020   Procedure: HEMOSTASIS CONTROL;  Surgeon: [name] Aloha Raddle.,  MD;  Location: [facility] ENDOSCOPY;  Service: Gastroenterology;;   INSERTION OF MESH  02/19/2023   Procedure: INSERTION OF MESH;  Surgeon: Desiderio Schanz, MD;  Location: [facility] ORS;  Service: General;;  umbilical   LAPAROSCOPIC PARTIAL RIGHT COLECTOMY Right 08/12/2012   Procedure: LAPAROSCOPIC PARTIAL RIGHT COLECTOMY; Location: [facility]; Surgeon: Unknown Sharps, MD   LEFT HEART CATH AND CORONARY ANGIOGRAPHY Left 06/09/2020   Procedure: LEFT HEART CATH AND CORONARY ANGIOGRAPHY;  Surgeon: Florencio Cara BIRCH, MD;  Location: [facility];  Service: Cardiovascular;  Laterality: Left;   LEFT HEART CATH AND CORS/GRAFTS ANGIOGRAPHY Left 11/20/2016   Procedure: Left Heart Cath and Cors/Grafts Angiography;   Surgeon: Cara BIRCH Florencio, MD;  Location: [facility];  Service: Cardiovascular;  Laterality: Left;   POLYPECTOMY  09/22/2020   Procedure: POLYPECTOMY;  Surgeon: [name] Aloha Raddle., MD;  Location: [facility] ENDOSCOPY;  Service: Gastroenterology;;   POLYPECTOMY  10/19/2023   Procedure: POLYPECTOMY;  Surgeon: Jinny Carmine, MD;  Location: [facility] ENDOSCOPY;  Service: Endoscopy;;   SUBMUCOSAL LIFTING INJECTION  09/22/2020   Procedure: SUBMUCOSAL LIFTING INJECTION;  Surgeon: [name] Aloha Raddle., MD;  Location: [facility] ENDOSCOPY;  Service: Gastroenterology;;   UMBILICAL HERNIA REPAIR N/A 02/19/2023   Procedure: HERNIA REPAIR UMBILICAL ADULT, open;  Surgeon: Desiderio Schanz, MD;  Location: [facility] ORS;  Service: General;  Laterality: N/A;   XI ROBOTIC ASSISTED INGUINAL HERNIA REPAIR WITH MESH Right 02/19/2023   Procedure: XI ROBOTIC ASSISTED INGUINAL HERNIA REPAIR WITH MESH;  Surgeon: Desiderio Schanz, MD;  Location: [facility] ORS;  Service: General;  Laterality: Right;   Social History   Socioeconomic History   Marital status: Married    Spouse name: Not on file   Number of children: Not on file   Years of education: Not on file   Highest education level: Not on file  Occupational History   Not on file  Tobacco Use   Smoking status: Former    Current packs/day: 0.00    Types: Cigarettes    Quit date: 2000    Years since quitting: 25.5    Passive exposure: Never   Smokeless tobacco: Never  Vaping Use   Vaping status: Never Used  Substance and Sexual Activity   Alcohol use: No   Drug use: No   Sexual activity: Not Currently  Other Topics Concern   Not on file  Social History Narrative   Not on file   Social Drivers of Health   Financial Resource Strain: Low Risk  (12/12/2023)   Received from [facility]   Overall Financial Resource Strain (CARDIA)    Difficulty of Paying Living Expenses: Not hard at all  Food Insecurity: No Food Insecurity (12/12/2023)   Received from [facility]   Hunger Vital Sign    Within the past 12 months, you worried that your food would run out before you got the money to buy more.: Never true    Within the past 12 months, the food you bought just didn't last and you didn't have money to get more.: Never true  Transportation Needs: No Transportation Needs (01/23/2024)   Received from [facility] - Transportation    In the past 12 months, has lack of transportation kept you from medical appointments or from getting medications?: No    Lack of Transportation (Non-Medical): No  Physical Activity: Not on file  Stress: Not on file  Social Connections: Moderately Isolated (10/17/2023)   Social Connection and Isolation Panel  Frequency of Communication with Friends and Family: More than three times a week    Frequency of Social Gatherings with Friends and Family: Three times a week    Attends Religious Services: Never    Active Member of Clubs or Organizations: No    Attends Banker Meetings: Never    Marital Status: Married  Catering manager Violence: Not At Risk (10/16/2023)   Humiliation, Afraid, Rape, and Kick questionnaire    Fear of Current or Ex-Partner: No    Emotionally Abused: No    Physically Abused: No    Sexually Abused: No   Allergies  Allergen Reactions   Isosorbide  Cough    Last set of Vital Signs (not current) Vitals:   03/02/24 2030 03/02/24 2102  BP: (!) 143/64 (!) 164/69  Pulse: 67 64  Resp: 16 16  Temp:  97.8 F (36.6 C)  SpO2: 100% 99%      Physical Exam Upon my arrival patient is alert, appropriately conversive, appears well-perfused  Procedures (when applicable, including Critical Care time): Procedures   MDM / Assessment and Plan I responded to a CODE BLUE but on my arrival no emergent airway was necessary.  Hospitalist and intensive care specialist at bedside    Nicholaus Rolland BRAVO, MD 03/02/24 2157

## 2024-03-02 NOTE — ED Notes (Signed)
 Pt ambulatory to restroom. Pt reports increased pain in chest with movement. Pain decreasing with rest.

## 2024-03-02 NOTE — Assessment & Plan Note (Signed)
 At baseline

## 2024-03-02 NOTE — Assessment & Plan Note (Signed)
 Home atorvastatin 80 mg daily resumed

## 2024-03-02 NOTE — Progress Notes (Signed)
 Rapid Response Event Note   Reason for Call : Unresponsive, agonal breathing, bradycardia, hypotensive VS: HR 38 with pulse, O2 79%, BP 66/49, diaphoretic  Initial Focused Assessment:  Patient laying across bed unresponsive, agonal shallow RR, pulse present, diaphoretic     Interventions:  BVM, code called for possible intubation. After approx 2 min of BVM the patient became alert and confused. After approx 5 min the patient was more alert and oriented with improved vital signs. Oxygen 3L Cl[place] placed. Patient requested to sit up due to difficulty breathing. Patient continued to say he was having problems catching his breath after sitting up.    Plan of Care:  Patient to be moved to SD unit for closer monitoring   Event Summary:   MD Notified:  Dr. Devon at bedside Call Time: [phone] Arrival Time: 9057 End Time: 1000  Suzen LITTIE Primer, RN

## 2024-03-02 NOTE — Hospital Course (Signed)
 [name] is a 78 year old male with history of atrial fibrillation on Eliquis , hyperlipidemia, hypertension, non-insulin -dependent diabetes mellitus, who presents emergency department for chief concerns of chest pain and shortness of breath.  Vitals in the ED showed tof 98.1, rr of 40 initially and now at 24, heart rate 68, blood pressure 167/66, SpO2 98% on room air.  Serum sodium is 139, potassium 4.2, chloride 106, bicarb 24, BUN of 50, serum creatinine of 2.82, EGFR 22, nonfasting glucose 184, WBC 7.1, hemoglobin 8.1, platelet 129.  HS troponin is 35.  ED treatment: None

## 2024-03-03 ENCOUNTER — Inpatient Hospital Stay

## 2024-03-03 ENCOUNTER — Inpatient Hospital Stay
Admit: 2024-03-03 | Discharge: 2024-03-03 | Disposition: A | Discharge status: Home / Self Care | Attending: Internal Medicine

## 2024-03-03 ENCOUNTER — Inpatient Hospital Stay: Admit: 2024-03-03

## 2024-03-03 DIAGNOSIS — R079 Chest pain, unspecified: Secondary | ICD-10-CM | POA: Diagnosis not present

## 2024-03-03 LAB — CBC
HCT: 23.8 % — ABNORMAL LOW (ref 39.0–52.0)
Hemoglobin: 7.3 g/dL — ABNORMAL LOW (ref 13.0–17.0)
MCH: 33.2 pg (ref 26.0–34.0)
MCHC: 30.7 g/dL (ref 30.0–36.0)
MCV: 108.2 fL — ABNORMAL HIGH (ref 80.0–100.0)
Platelets: 72 K/uL — ABNORMAL LOW (ref 150–400)
RBC: 2.2 MIL/uL — ABNORMAL LOW (ref 4.22–5.81)
RDW: 20 % — ABNORMAL HIGH (ref 11.5–15.5)
WBC: 5.9 K/uL (ref 4.0–10.5)
nRBC: 0 % (ref 0.0–0.2)

## 2024-03-03 LAB — BASIC METABOLIC PANEL WITH GFR
Anion gap: 9 (ref 5–15)
BUN: 50 mg/dL — ABNORMAL HIGH (ref 8–23)
CO2: 25 mmol/L (ref 22–32)
Calcium: 10.2 mg/dL (ref 8.9–10.3)
Chloride: 107 mmol/L (ref 98–111)
Creatinine, Ser: 2.71 mg/dL — ABNORMAL HIGH (ref 0.61–1.24)
GFR, Estimated: 24 mL/min — ABNORMAL LOW (ref >60)
Glucose, Bld: 143 mg/dL — ABNORMAL HIGH (ref 70–99)
Potassium: 5.1 mmol/L (ref 3.5–5.1)
Sodium: 141 mmol/L (ref 135–145)

## 2024-03-03 LAB — LACTIC ACID, PLASMA: Lactic Acid, Venous: 1.1 mmol/L (ref 0.5–1.9)

## 2024-03-03 LAB — GLUCOSE, CAPILLARY
Glucose-Capillary: 153 mg/dL — ABNORMAL HIGH (ref 70–99)
Glucose-Capillary: 193 mg/dL — ABNORMAL HIGH (ref 70–99)
Glucose-Capillary: 119 mg/dL — ABNORMAL HIGH (ref 70–99)
Glucose-Capillary: 201 mg/dL — ABNORMAL HIGH (ref 70–99)

## 2024-03-03 LAB — TROPONIN I (HIGH SENSITIVITY)
Troponin I (High Sensitivity): 102 ng/L (ref <18)
Troponin I (High Sensitivity): 97 ng/L — ABNORMAL HIGH (ref <18)
Troponin I (High Sensitivity): 90 ng/L — ABNORMAL HIGH (ref <18)

## 2024-03-03 LAB — PREPARE RBC (CROSSMATCH)

## 2024-03-03 LAB — HEMOGLOBIN A1C
Hgb A1c MFr Bld: 4.6 % — ABNORMAL LOW (ref 4.8–5.6)
Mean Plasma Glucose: 85.32 mg/dL

## 2024-03-03 LAB — MRSA NEXT GEN BY PCR, NASAL: MRSA by PCR Next Gen: NOT DETECTED

## 2024-03-03 MED ORDER — LIDOCAINE HCL (PF) 1 % IJ SOLN
10.0000 mL | Freq: Once | INTRAMUSCULAR | Status: AC
  Administered 2024-03-03: 10 mL via INTRADERMAL
  Filled 2024-03-03: qty 10

## 2024-03-03 MED ORDER — AMLODIPINE BESYLATE 5 MG PO TABS
5.0000 mg | ORAL_TABLET | Freq: Two times a day (BID) | ORAL | Status: DC
  Administered 2024-03-03 – 2024-03-04 (×2): 5 mg via ORAL
  Filled 2024-03-03 (×2): qty 1

## 2024-03-03 MED ORDER — EMPAGLIFLOZIN 10 MG PO TABS
10.0000 mg | ORAL_TABLET | Freq: Every day | ORAL | Status: DC
  Administered 2024-03-03 – 2024-03-04 (×2): 10 mg via ORAL
  Filled 2024-03-03 (×2): qty 1

## 2024-03-03 MED ORDER — FUROSEMIDE 10 MG/ML IJ SOLN
80.0000 mg | Freq: Once | INTRAMUSCULAR | Status: AC
  Administered 2024-03-03: 80 mg via INTRAVENOUS
  Filled 2024-03-03: qty 8

## 2024-03-03 MED ORDER — AMLODIPINE BESYLATE 5 MG PO TABS: 5.0000 mg | ORAL_TABLET | Freq: Every day | ORAL | Status: DC

## 2024-03-03 MED ORDER — FUROSEMIDE 10 MG/ML IJ SOLN: 40.0000 mg | Freq: Two times a day (BID) | INTRAMUSCULAR | Status: DC

## 2024-03-03 MED ORDER — SODIUM CHLORIDE 0.9% IV SOLUTION: Freq: Once | INTRAVENOUS | Status: AC

## 2024-03-03 NOTE — Progress Notes (Addendum)
 Progress Note    [name]  [name] DOB: 1945/12/10  DOA: 03/02/2024 PCP: Pcp, No      Brief Narrative:    Medical records reviewed and are as summarized below:  Jerry WINTERHALTER is a 78 y.o. 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  His symptoms are generally worse with exertion.  He was admitted to the hospital for further evaluation.  On 03/02/2024 around 10 PM, rapid response event was called because patient reportedly became hypotensive, unresponsive associated with agonal breathing.  CODE BLUE was called and apparently he was bagged for about 2 minutes.  Subsequently, he regained consciousness and his vital signs improved.  He was transferred to the stepdown unit for close monitoring.    Assessment/Plan:   Principal Problem:   Chest pain Active Problems:   CAD (coronary artery disease), native coronary artery   Paroxysmal atrial fibrillation (HCC)   CKD (chronic kidney disease), stage IV (HCC)   BBB (bundle branch block)   Essential hypertension   OSA (obstructive sleep apnea)   S/P CABG x 3   Other hyperlipidemia   S/P drug eluting coronary stent placement   Iron  deficiency anemia   Dyspnea on exertion   Diabetes mellitus type 2, noninsulin dependent (HCC)   Respiratory arrest (HCC)   Unresponsiveness   Pleural effusion    Body mass index is 26.22 kg/m.   Transient period of unresponsiveness, hypotension and acute hypoxic respiratory failure on 03/02/2024: Resolved.   Exertional shortness of breath and chest pain: Troponins negative.  2D echo is pending.  Follow-up with cardiologist.  Cardiologist contemplating left heart cath depending on  echo findings.   Bilateral pleural effusion (moderate on the right, small on the left), history of recurrent pleural effusion: It's not clear if this has anything to do with aforementioned symptoms.  Right-sided thoracentesis has been ordered.   Elevated troponins: Probably from demand ischemia.  Troponins up from 36-85-102 and down to 97-90.  2D echo is pending.  Follow-up with cardiologist.   Paroxysmal atrial fibrillation, CAD s/p coronary stent: Continue Eliquis  and carvedilol    Worsening anemia of chronic kidney disease: This could also be contributing to her exertional shortness of breath and chest pain.  Hemoglobin down from 8.9-7.3.  Transfuse 1 unit of PRBCs because of underlying CAD and CKD.  Discussed risk, benefits and alternatives of blood transfusion and he is agreeable to transfusion.  He said he usually feels better after blood transfusion. Iron  studies: Iron  80, TIBC 272, saturation ratio 30, ferritin 311.  This excludes iron  deficiency anemia at this time.    Comorbidities include CKD stage IV, type II DM,  anemia of chronic disease, hypertension, hyperlipidemia, history of TAVR on 02/25/2024 for aortic stenosis    Diet Order             Diet Heart Room service appropriate? Yes; Fluid consistency: Thin  Diet effective now                            Consultants: Cardiologist  Procedures: None    Medications:    apixaban   5 mg Oral BID   atorvastatin   80 mg Oral Daily   carvedilol   6.25 mg Oral  BID WC   Chlorhexidine  Gluconate Cloth  6 each Topical Daily   cyanocobalamin   1,000 mcg Oral Daily   ferrous sulfate   325 mg Oral Daily   insulin  aspart  0-5 Units Subcutaneous QHS   insulin  aspart  0-9 Units Subcutaneous TID WC   pantoprazole   40 mg Oral Daily   Continuous Infusions:   Anti-infectives (From admission, onward)    None              Family Communication/Anticipated D/C date and plan/Code Status   DVT prophylaxis:  Place [name] hose Start: 03/02/24 1605 apixaban  (ELIQUIS ) tablet 5 mg     Code Status: Full Code  Family Communication: His daughter-in-law was placed this afternoon.  Plan discussed with her at the bedside. Disposition Plan: Plan to discharge home   Status is: Inpatient Remains inpatient appropriate because: Exertional chest pain and shortness of breath       Subjective:   Interval events noted.  He feels better today.  No chest pain or shortness of breath at rest.  Objective:    Vitals:   03/03/24 0700 03/03/24 0800 03/03/24 0900 03/03/24 1000  BP: (!) 164/67 (!) 170/80 (!) 153/77 138/62  Pulse: 60 (!) 57 62 66  Resp: 20 20 (!) 24 19  Temp:  98 F (36.7 C)    TempSrc:      SpO2: 97% 97% 93% 100%  Weight:      Height:       No data found.   Intake/Output Summary (Last 24 hours) at 03/03/2024 1122 Last data filed at 03/03/2024 0916 Gross per 24 hour  Intake --  Output 500 ml  Net -500 ml   Filed Weights   03/02/24 1309  Weight: 88.9 kg    Exam:  GEN: NAD SKIN: Warm and dry EYES: No pallor or icterus ENT: MMM CV: RRR PULM: Decreased air entry at the right lung base.  No rales or wheezing ABD: soft, ND, NT, +BS CNS: AAO x 3, non focal EXT: No edema or tenderness        Data Reviewed:   I have personally reviewed following labs and imaging studies:  Labs: Labs show the following:   Basic Metabolic Panel: Recent Labs  Lab 03/02/24 1314 03/02/24 2154 03/03/24 0330  NA 139 139 141  K 5.2* 5.3* 5.1  CL 106 105 107  CO2 24 19* 25  GLUCOSE 184* 248* 143*  BUN 50* 50* 50*  CREATININE 2.82* 2.90* 2.71*  CALCIUM  10.2 10.5* 10.2  MG 2.3 2.5*  --    GFR Estimated Creatinine Clearance: 25.8 mL/min (A) (by C-G formula based on SCr of 2.71 mg/dL (H)). Liver Function Tests: No results for input(s): AST, ALT, ALKPHOS, BILITOT, PROT, ALBUMIN in the last 168 hours. No results for input(s): LIPASE, AMYLASE in the last 168 hours. No  results for input(s): AMMONIA in the last 168 hours. Coagulation profile Recent Labs  Lab 03/02/24 1314  INR 1.7*    CBC: Recent Labs  Lab 03/02/24 1314 03/02/24 2154 03/03/24 0330  WBC 7.1 8.1 5.9  HGB 8.1* 8.9* 7.3*  HCT 26.0* 28.4* 23.8*  MCV 107.9* 109.2* 108.2*  PLT 129* 113* 72*   Cardiac Enzymes: No results for input(s): CKTOTAL, CKMB, CKMBINDEX, TROPONINI in the last 168 hours. BNP (last 3 results) No results for input(s): PROBNP in the last 8760 hours. CBG: Recent Labs  Lab 03/02/24 1635 03/02/24 2103 03/02/24 2141 03/02/24 2334  GLUCAP 138* 135* 153* 200*   D-Dimer:  No results for input(s): DDIMER in the last 72 hours. Hgb A1c: Recent Labs    03/03/24 0330  HGBA1C 4.6*   Lipid Profile: No results for input(s): CHOL, HDL, LDLCALC, TRIG, CHOLHDL, LDLDIRECT in the last 72 hours. Thyroid  function studies: Recent Labs    03/02/24 1314  TSH 2.630   Anemia work up: Recent Labs    03/02/24 1314  FERRITIN 311  TIBC 272  IRON  80   Sepsis Labs: Recent Labs  Lab 03/02/24 1314 03/02/24 2154 03/03/24 0023 03/03/24 0330  PROCALCITON <0.10  --   --   --   WBC 7.1 8.1  --  5.9  LATICACIDVEN  --  6.2* 1.1  --     Microbiology Recent Results (from the past 240 hours)  Resp panel by RT-PCR (RSV, Flu A&B, Covid) Anterior Nasal Swab     Status: None   Collection Time: 03/02/24  4:17 PM   Specimen: Anterior Nasal Swab  Result Value Ref Range Status   SARS Coronavirus 2 by RT PCR NEGATIVE NEGATIVE Final    Comment: (NOTE) SARS-CoV-2 target nucleic acids are NOT DETECTED.  The SARS-CoV-2 RNA is generally detectable in upper respiratory specimens during the acute phase of infection. The lowest concentration of SARS-CoV-2 viral copies this assay can detect is 138 copies/mL. A negative result does not preclude SARS-Cov-2 infection and should not be used as the sole basis for treatment or other patient management decisions. A  negative result may occur with  improper specimen collection/handling, submission of specimen other than nasopharyngeal swab, presence of viral mutation(s) within the areas targeted by this assay, and inadequate number of viral copies(<138 copies/mL). A negative result must be combined with clinical observations, patient history, and epidemiological information. The expected result is Negative.  Fact Sheet for Patients:  BloggerCourse.com  Fact Sheet for Healthcare Providers:  SeriousBroker.it  This test is no t yet approved or cleared by the [place]  FDA and  has been authorized for detection and/or diagnosis of SARS-CoV-2 by FDA under an Emergency Use Authorization (EUA). This EUA will remain  in effect (meaning this test can be used) for the duration of the COVID-19 declaration under Section 564(b)(1) of the Act, 21 U.S.C.section 360bbb-3(b)(1), unless the authorization is terminated  or revoked sooner.       Influenza A by PCR NEGATIVE NEGATIVE Final   Influenza B by PCR NEGATIVE NEGATIVE Final    Comment: (NOTE) The Xpert Xpress SARS-CoV-2/FLU/RSV plus assay is intended as an aid in the diagnosis of influenza from Nasopharyngeal swab specimens and should not be used as a sole basis for treatment. Nasal washings and aspirates are unacceptable for Xpert Xpress SARS-CoV-2/FLU/RSV testing.  Fact Sheet for Patients: BloggerCourse.com  Fact Sheet for Healthcare Providers: SeriousBroker.it  This test is not yet approved or cleared by the [place]  FDA and has been authorized for detection and/or diagnosis of SARS-CoV-2 by FDA under an Emergency Use Authorization (EUA). This EUA will remain in effect (meaning this test can be used) for the duration of the COVID-19 declaration under Section 564(b)(1) of the Act, 21 U.S.C. section 360bbb-3(b)(1), unless the authorization  is terminated or revoked.     Resp Syncytial Virus by PCR NEGATIVE NEGATIVE Final    Comment: (NOTE) Fact Sheet for Patients: BloggerCourse.com  Fact Sheet for Healthcare Providers: SeriousBroker.it  This test is not yet approved or cleared by the [place]  FDA and has been authorized for detection and/or diagnosis of SARS-CoV-2 by FDA under an Emergency Use  Authorization (EUA). This EUA will remain in effect (meaning this test can be used) for the duration of the COVID-19 declaration under Section 564(b)(1) of the Act, 21 U.S.C. section 360bbb-3(b)(1), unless the authorization is terminated or revoked.  Performed at [facility], [address] [place], [place] [address]   MRSA Next Gen by PCR, Nasal     Status: None   Collection Time: 03/02/24 11:40 PM   Specimen: Nasal Mucosa; Nasal Swab  Result Value Ref Range Status   MRSA by PCR Next Gen NOT DETECTED NOT DETECTED Final    Comment: (NOTE) The GeneXpert MRSA Assay (FDA approved for NASAL specimens only), is one component of a comprehensive MRSA colonization surveillance program. It is not intended to diagnose MRSA infection nor to guide or monitor treatment for MRSA infections. Test performance is not FDA approved in patients less than 62 years old. Performed at [facility], [address] [place], [place] [address]     Procedures and diagnostic studies:  DG Chest 2 View Result Date: 03/02/2024 CLINICAL DATA:  Shortness of breath EXAM: CHEST - 2 VIEW COMPARISON:  Chest x-ray performed October 28, 2023 FINDINGS: Moderate right-sided pleural effusion and small left pleural effusion. Enlarged heart. IMPRESSION: 1. Moderate right pleural effusion. 2. Small left pleural effusion. 3. Enlarged heart. Electronically Signed   By: Maude Naegeli M.D.   On: 03/02/2024 13:53               LOS: 1 day   Blythe Hartshorn  [facility]   Pager  on [email or web address]. If 7PM-7AM, please contact night-coverage at [email or web address]     03/03/2024, 11:22 AM

## 2024-03-03 NOTE — Plan of Care (Signed)
Gerald Stabs RN

## 2024-03-03 NOTE — Progress Notes (Signed)
 Pt arrived back on unit from thoracentesis at this time. VSS.

## 2024-03-03 NOTE — Progress Notes (Signed)
 Pt off unit for US  thoracentesis at this time. Pt's VSS prior to leaving unit. Order received that pt is okay to go down for procedure without ICU RN.

## 2024-03-03 NOTE — Plan of Care (Signed)
  Problem: Skin Integrity: Goal: Risk for impaired skin integrity will decrease Outcome: Progressing   Problem: Tissue Perfusion: Goal: Adequacy of tissue perfusion will improve Outcome: Progressing   Problem: Clinical Measurements: Goal: Ability to maintain clinical measurements within normal limits will improve Outcome: Progressing Goal: Respiratory complications will improve Outcome: Progressing Goal: Cardiovascular complication will be avoided Outcome: Progressing   Problem: Pain Managment: Goal: General experience of comfort will improve and/or be controlled Outcome: Progressing

## 2024-03-03 NOTE — Procedures (Signed)
 PROCEDURE SUMMARY:  Successful image-guided right-sided therapeutic thoracentesis. Yielded 2.4 liters of clear, straw-colored pleual fluid. Patient tolerated procedure well. EBL: Zero No immediate complications.  Post procedure CXR shows no pneumothorax.  Please see imaging section of Epic for full dictation.  Carlin DELENA Griffon PA-C 03/03/2024 5:46 PM

## 2024-03-04 DIAGNOSIS — I480 Paroxysmal atrial fibrillation: Secondary | ICD-10-CM | POA: Diagnosis not present

## 2024-03-04 DIAGNOSIS — I454 Nonspecific intraventricular block: Secondary | ICD-10-CM

## 2024-03-04 DIAGNOSIS — D508 Other iron deficiency anemias: Secondary | ICD-10-CM

## 2024-03-04 DIAGNOSIS — E119 Type 2 diabetes mellitus without complications: Secondary | ICD-10-CM

## 2024-03-04 DIAGNOSIS — I10 Essential (primary) hypertension: Secondary | ICD-10-CM

## 2024-03-04 DIAGNOSIS — N184 Chronic kidney disease, stage 4 (severe): Secondary | ICD-10-CM | POA: Diagnosis not present

## 2024-03-04 DIAGNOSIS — E7849 Other hyperlipidemia: Secondary | ICD-10-CM

## 2024-03-04 DIAGNOSIS — R079 Chest pain, unspecified: Secondary | ICD-10-CM | POA: Diagnosis not present

## 2024-03-04 DIAGNOSIS — G4733 Obstructive sleep apnea (adult) (pediatric): Secondary | ICD-10-CM

## 2024-03-04 LAB — BASIC METABOLIC PANEL WITH GFR
Anion gap: 10 (ref 5–15)
BUN: 51 mg/dL — ABNORMAL HIGH (ref 8–23)
CO2: 23 mmol/L (ref 22–32)
Calcium: 9.8 mg/dL (ref 8.9–10.3)
Chloride: 105 mmol/L (ref 98–111)
Creatinine, Ser: 3.05 mg/dL — ABNORMAL HIGH (ref 0.61–1.24)
GFR, Estimated: 20 mL/min — ABNORMAL LOW (ref >60)
Glucose, Bld: 147 mg/dL — ABNORMAL HIGH (ref 70–99)
Potassium: 5.2 mmol/L — ABNORMAL HIGH (ref 3.5–5.1)
Sodium: 138 mmol/L (ref 135–145)

## 2024-03-04 LAB — CBC
HCT: 25.4 % — ABNORMAL LOW (ref 39.0–52.0)
Hemoglobin: 8.1 g/dL — ABNORMAL LOW (ref 13.0–17.0)
MCH: 33.1 pg (ref 26.0–34.0)
MCHC: 31.9 g/dL (ref 30.0–36.0)
MCV: 103.7 fL — ABNORMAL HIGH (ref 80.0–100.0)
Platelets: 101 K/uL — ABNORMAL LOW (ref 150–400)
RBC: 2.45 MIL/uL — ABNORMAL LOW (ref 4.22–5.81)
RDW: 20.7 % — ABNORMAL HIGH (ref 11.5–15.5)
WBC: 7.5 K/uL (ref 4.0–10.5)
nRBC: 0 % (ref 0.0–0.2)

## 2024-03-04 LAB — VITAMIN B12: Vitamin B-12: 842 pg/mL (ref 180–914)

## 2024-03-04 LAB — HEMOGLOBIN AND HEMATOCRIT, BLOOD
HCT: 25.9 % — ABNORMAL LOW (ref 39.0–52.0)
Hemoglobin: 8.2 g/dL — ABNORMAL LOW (ref 13.0–17.0)

## 2024-03-04 LAB — ECHOCARDIOGRAM COMPLETE
AV Mean grad: 19.4 mmHg
AV Peak grad: 35.8 mmHg
Ao pk vel: 2.99 m/s
Area-P 1/2: 4.15 cm2
Height: 72.5 in
MV M vel: 6.22 m/s
MV Peak grad: 154.8 mmHg
P 1/2 time: 370 ms
S' Lateral: 3 cm
Weight: 3136 [oz_av]

## 2024-03-04 LAB — FOLATE: Folate: 12.6 ng/mL (ref >5.9)

## 2024-03-04 LAB — GLUCOSE, CAPILLARY
Glucose-Capillary: 133 mg/dL — ABNORMAL HIGH (ref 70–99)
Glucose-Capillary: 165 mg/dL — ABNORMAL HIGH (ref 70–99)

## 2024-03-04 MED ORDER — CARVEDILOL 3.125 MG PO TABS: 3.1250 mg | ORAL_TABLET | Freq: Two times a day (BID) | ORAL | 1 refills | Status: DC

## 2024-03-04 MED ORDER — INSULIN GLARGINE-YFGN 100 UNIT/ML ~~LOC~~ SOLN
10.0000 [IU] | SUBCUTANEOUS | Status: DC
  Administered 2024-03-04: 10 [IU] via SUBCUTANEOUS
  Filled 2024-03-04: qty 0.1

## 2024-03-04 MED ORDER — CARVEDILOL 3.125 MG PO TABS: 3.1250 mg | ORAL_TABLET | Freq: Two times a day (BID) | ORAL | Status: DC

## 2024-03-04 MED ORDER — SODIUM ZIRCONIUM CYCLOSILICATE 5 G PO PACK: 10.0000 g | PACK | Freq: Every day | ORAL | 0 refills | Status: AC

## 2024-03-04 MED ORDER — SODIUM ZIRCONIUM CYCLOSILICATE 5 G PO PACK
10.0000 g | PACK | Freq: Every day | ORAL | Status: DC
  Administered 2024-03-04: 10 g via ORAL
  Filled 2024-03-04: qty 2

## 2024-03-04 NOTE — Plan of Care (Signed)
  Problem: Education: Goal: Ability to describe self-care measures that may prevent or decrease complications (Diabetes Survival Skills Education) will improve Outcome: Progressing   Problem: Coping: Goal: Ability to adjust to condition or change in health will improve Outcome: Progressing   Problem: Fluid Volume: Goal: Ability to maintain a balanced intake and output will improve Outcome: Progressing   Problem: Health Behavior/Discharge Planning: Goal: Ability to identify and utilize available resources and services will improve Outcome: Progressing Goal: Ability to manage health-related needs will improve Outcome: Progressing   Problem: Nutritional: Goal: Maintenance of adequate nutrition will improve Outcome: Progressing Goal: Progress toward achieving an optimal weight will improve Outcome: Progressing   Problem: Skin Integrity: Goal: Risk for impaired skin integrity will decrease Outcome: Progressing   Problem: Clinical Measurements: Goal: Ability to maintain clinical measurements within normal limits will improve Outcome: Progressing Goal: Will remain free from infection Outcome: Progressing Goal: Diagnostic test results will improve Outcome: Progressing Goal: Respiratory complications will improve Outcome: Progressing

## 2024-03-04 NOTE — Progress Notes (Signed)
 [facility] CLINIC CARDIOLOGY PROGRESS NOTE       Patient ID: [name] MRN: [id number] DOB/AGE: [birth year] 78 y.o.  Admit date: 03/02/2024 Referring Physician Dr. Greig Free Primary Physician Pcp, No Primary Cardiologist Dr. Lady Patel/ Dr. Nanci Blanch [facility]) Reason for Consultation chest pain, elevated trops  HPI: [name] is a 78 y.o. male  with a past medical history of chronic HFpEF, persistent atrial fibrillation, aortic stenosis s/p TAVR (01/2024), coronary artery disease s/p CABG x3, multiple coronary stents, most recent stent to ostial SVG in-stent restenosis (2021),  hypertension, hyperlipidemia, OSA, CKD, diabetes who presented to the ED on 03/02/2024 for chest pain and shortness of breath. Troponins found to be minimally elevated. Evening of 07/21 patient sudden became unresponsive with agonal breathing and did not respond to sternal rub, code blue was called. Patient was bagged for about 2 minutes, then became responsive with stable vitals. Lactic acid found to be elevated and patient transferred to stepdown unit. Cardiology was consulted for further evaluation.   Interval History: -Patient seen and examined this AM and laying comfortably in hospital bed. Patient states he feels a lot better and denies any recurrence of chest pain, SOB or lightheadedness.  Patient eager to go home today. -Patients BP and HR stable this AM. Overnight Tele showed no significant events.  -Worsening renal function today. -Patient remains on room air with stable SpO2.  - Recommend patient ambulate today.   Pertinent Cardiac History (Most recent) LHC (05/2020) Normal left ventricular function 60% Coronaries - Left main large calcified moderate lesion moderate to large calcium  - Distal LAD large ostial lesion 75% moderate calcification proximal LAD stent widely patent mid LAD stent widely patent distal LAD diffuse 50% - Circumflex is large 70% ostial mid stent widely patent distal  circumflex 95% TIMI-3 flow - RCA medium proximal 90% lesion 90% mid distal 75 - Grafts LIMA to LAD incompletely evaluated probably occluded - SVG to circumflex 100% occluded - SVG to PDA 95% ostial in-stent TIMI-3 flow Intervention Successful PCI and stent to in-stent restenosis ostially with 2.5 x 18 mm resolute Onyx to 20 atm Lesion reduced from 95 down to 0% TIMI-3 flow maintained throughout   Successful PCI and stent ostial SVG in-stent restenosis. Lesion reduced from 95 down to 0%  Review of systems complete and found to be negative unless listed above    Past Medical History:  Diagnosis Date   (HFpEF) heart failure with preserved ejection fraction (HCC) 07/25/2016   a.)TTE 07/25/16: EF >55, triv-mild pan regur, mild AS, G2DD; b.)TTE 09/02/16: EF >55, triv TR, mild AS; c.)TTE 11/05/16: EF >55, LAE, triv-mild pan regur, mild AS, G2DD; d.)TTE 01/21/20: EF >55, mod LVH, LAE/RVE, triv-mild AR/PR/TR, mod MR, mild AS, G2DD; e.)TTE 09/24/22: EF>55, sev LVH, sev LAE, RAE, mild PR, mod AR/MR/TR, mild-mod AS, RVSP 74.7; f.)TTE 12/23/22: EF 60-65, LAE, mild-mod MR/TR, mild AS   Abnormal urine 05/01/2023   Anemia    Aortic atherosclerosis (HCC)    Aortic stenosis 07/25/2016   a.) TTE 07/25/2016: mild (MPG 13.5); b.) TTE 09/02/2016: mild (MPG 16); c.) TTE 11/05/2016: mild (MPG 9); d.) TTE 01/21/2020: mild (MPG 11.7); e.) TTE 09/24/2022: mild-mod (MPG 20.3); f.) TTE 12/23/2022: mild (MPG 15)   Atrial fibrillation (HCC)    a.) CHA2DS2VASc = 6 (age x2, CHF, HTN, vascular disease history, T2DM);  b.) rate/rhythm maintained on oral labetalol ; chronically anticoagulated with apixaban  + clopidogrel    AV block, 1st degree    B12 deficiency  CKD (chronic kidney disease), stage III (HCC)    Colon polyps    Congestive heart failure (HCC) 05/01/2023   Coronary artery disease 08/16/2016   a.) s/p 3v CABG 08/30/2016; b.) s/p PCI 11/26/2016 (DES x 1 oSVG-RCA); c.) s/p PCI 12/17/2016 (DES x 5 --> mLCx x2,  dLCx, pLAD, dLAD); c.) s/p PCI 06/09/2020 (DES x1 --> ISR oSVG-PDA)   Cyst of kidney, acquired 07/03/2023   Dyspnea    Gastroesophageal reflux disease 05/01/2023   GERD (gastroesophageal reflux disease)    Heart murmur    Hepatic steatosis    History of 2019 novel coronavirus disease (COVID-19) 09/02/2020   a.) Tx'd with remdesivir    History of blood transfusion    History of GI bleed 09/02/2020   a.) admitted to [facility] 09/02/2020 - 09/08/2020   History of heart artery stent 11/26/2016   TOTAL of 7 stents: a.) 11/26/2016 --> 3.5 x 22 mm Resolute Integrity oSVG-RCA; b.) 12/17/2016: 3.0 x 12 mm Xience Alpine dLCx, 3.5 x 22 mm Resolute Onyx mLCx, 3.5 x 18 mm Resolute Onyx mLCx, 3.0 x 26 mm Resolute Onyx pLAD, 2.0 x 26 mm Resolute Onyx dLAD; c.) 06/09/2020 --> 2.5 x 18 mm Resolute Onyx ISR oSVG-PDA   History of partial colectomy 08/12/2012   a.) large gastric hyperplastic polyp removal   HLD (hyperlipidemia)    Hypertension    LBBB (left bundle branch block)    Long term current use of anticoagulant    a.) apixaban    Long term current use of antithrombotics/antiplatelets    a.) clopidogrel    Male hypogonadism    a.) on exogenous TRT (1.62% gel)   Mild cardiomegaly    Mild gynecomastia (bilateral)    Nephrolithiasis    OSA (obstructive sleep apnea)    a.) does not utilize nocturnal PAP therapy   Personal history of surgery to heart and great vessels, presenting hazards to health 05/01/2023   Proteinuria, unspecified 08/01/2023   Pulmonary hypertension (HCC) 09/24/2022   a.) TTE 09/24/2022: RVSP 74.7; b.) TTE 12/23/2022: RVSP 31.3   RBBB (right bundle branch block with left anterior fascicular block)    Right inguinal hernia    Right thyroid  nodule    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   a.) LIMA-LAD, SVG-PDA, SVG-OM3   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/01/2023   Type 2 diabetes mellitus treated with insulin  (HCC)    Type 2 diabetes mellitus with diabetic chronic kidney disease (HCC)  05/01/2023   Umbilical hernia    Unstable angina (HCC)    Vitamin D  deficiency     Past Surgical History:  Procedure Laterality Date   APPENDECTOMY     CARDIAC CATHETERIZATION Left 08/16/2016   Procedure: Left Heart Cath and Coronary Angiography;  Surgeon: Cara JONETTA Lovelace, MD;  Location: [facility] INVASIVE CV LAB;  Service: Cardiovascular;  Laterality: Left;   COLONOSCOPY N/A 09/07/2020   Procedure: COLONOSCOPY;  Surgeon: Janalyn Keene NOVAK, MD;  Location: [facility] ENDOSCOPY;  Service: Endoscopy;  Laterality: N/A;   COLONOSCOPY N/A 10/19/2023   Procedure: COLONOSCOPY;  Surgeon: Jinny Carmine, MD;  Location: [facility] ENDOSCOPY;  Service: Endoscopy;  Laterality: N/A;   COLONOSCOPY WITH PROPOFOL  N/A 05/17/2021   Procedure: COLONOSCOPY WITH PROPOFOL ;  Surgeon: Janalyn Keene NOVAK, MD;  Location: [facility] ENDOSCOPY;  Service: Endoscopy;  Laterality: N/A;   CORONARY ANGIOPLASTY WITH STENT PLACEMENT Left 11/26/2016   Procedure: CORONARY ANGIOPLASTY WITH STENT PLACEMENT; Location: [facility]; Surgeon: Alm Dais, MD   CORONARY ARTERY BYPASS GRAFT N/A 08/30/2016   Procedure: CORONARY  ARTERY BYPASS GRAFT; Location: [facility]; Surgeon: Juliene Pouch, MD   CORONARY STENT INTERVENTION N/A 06/09/2020   Procedure: CORONARY STENT INTERVENTION;  Surgeon: Florencio Cara BIRCH, MD;  Location: [facility] INVASIVE CV LAB;  Service: Cardiovascular;  Laterality: N/A;   CORONARY STENT INTERVENTION Left 12/17/2016   Procedure: CORONARY STENT INTERVENTION; Location: [facility]; Surgeon: Alm Dais, MD   ENDOSCOPIC MUCOSAL RESECTION N/A 09/22/2020   Procedure: ENDOSCOPIC MUCOSAL RESECTION;  Surgeon: [name] Aloha Raddle., MD;  Location: [facility] ENDOSCOPY;  Service: Gastroenterology;  Laterality: N/A;   ESOPHAGOGASTRODUODENOSCOPY N/A 09/07/2020   Procedure: ESOPHAGOGASTRODUODENOSCOPY (EGD);  Surgeon: Janalyn Keene NOVAK, MD;  Location: [facility] ENDOSCOPY;  Service: Endoscopy;  Laterality: N/A;   ESOPHAGOGASTRODUODENOSCOPY N/A 10/19/2023   Procedure: EGD  (ESOPHAGOGASTRODUODENOSCOPY);  Surgeon: Jinny Carmine, MD;  Location: [facility] ENDOSCOPY;  Service: Endoscopy;  Laterality: N/A;   ESOPHAGOGASTRODUODENOSCOPY (EGD) WITH PROPOFOL  N/A 09/22/2020   Procedure: ESOPHAGOGASTRODUODENOSCOPY (EGD) WITH PROPOFOL ;  Surgeon: [name] Aloha Raddle., MD;  Location: [facility] ENDOSCOPY;  Service: Gastroenterology;  Laterality: N/A;   ESOPHAGOGASTRODUODENOSCOPY (EGD) WITH PROPOFOL  N/A 08/26/2023   Procedure: ESOPHAGOGASTRODUODENOSCOPY (EGD) WITH PROPOFOL ;  Surgeon: Jinny Carmine, MD;  Location: [facility] ENDOSCOPY;  Service: Endoscopy;  Laterality: N/A;   FRACTURE SURGERY     HEMOSTASIS CLIP PLACEMENT  09/22/2020   Procedure: HEMOSTASIS CLIP PLACEMENT;  Surgeon: [name] Aloha Raddle., MD;  Location: [facility] ENDOSCOPY;  Service: Gastroenterology;;   HEMOSTASIS CONTROL  09/22/2020   Procedure: HEMOSTASIS CONTROL;  Surgeon: [name] Aloha Raddle., MD;  Location: [facility] ENDOSCOPY;  Service: Gastroenterology;;   INSERTION OF MESH  02/19/2023   Procedure: INSERTION OF MESH;  Surgeon: Desiderio Schanz, MD;  Location: [facility] ORS;  Service: General;;  umbilical   LAPAROSCOPIC PARTIAL RIGHT COLECTOMY Right 08/12/2012   Procedure: LAPAROSCOPIC PARTIAL RIGHT COLECTOMY; Location: [facility]; Surgeon: Unknown Sharps, MD   LEFT HEART CATH AND CORONARY ANGIOGRAPHY Left 06/09/2020   Procedure: LEFT HEART CATH AND CORONARY ANGIOGRAPHY;  Surgeon: Florencio Cara BIRCH, MD;  Location: [facility] INVASIVE CV LAB;  Service: Cardiovascular;  Laterality: Left;   LEFT HEART CATH AND CORS/GRAFTS ANGIOGRAPHY Left 11/20/2016   Procedure: Left Heart Cath and Cors/Grafts Angiography;  Surgeon: Cara BIRCH Florencio, MD;  Location: [facility] INVASIVE CV LAB;  Service: Cardiovascular;  Laterality: Left;   POLYPECTOMY  09/22/2020   Procedure: POLYPECTOMY;  Surgeon: [name] Aloha Raddle., MD;  Location: [facility] ENDOSCOPY;  Service: Gastroenterology;;   POLYPECTOMY  10/19/2023   Procedure: POLYPECTOMY;  Surgeon: Jinny Carmine, MD;  Location: [facility] ENDOSCOPY;   Service: Endoscopy;;   SUBMUCOSAL LIFTING INJECTION  09/22/2020   Procedure: SUBMUCOSAL LIFTING INJECTION;  Surgeon: [name] Aloha Raddle., MD;  Location: [facility] ENDOSCOPY;  Service: Gastroenterology;;   UMBILICAL HERNIA REPAIR N/A 02/19/2023   Procedure: HERNIA REPAIR UMBILICAL ADULT, open;  Surgeon: Desiderio Schanz, MD;  Location: [facility] ORS;  Service: General;  Laterality: N/A;   XI ROBOTIC ASSISTED INGUINAL HERNIA REPAIR WITH MESH Right 02/19/2023   Procedure: XI ROBOTIC ASSISTED INGUINAL HERNIA REPAIR WITH MESH;  Surgeon: Desiderio Schanz, MD;  Location: [facility] ORS;  Service: General;  Laterality: Right;    Medications Prior to Admission  Medication Sig Dispense Refill Last Dose/Taking   acetaminophen  (TYLENOL ) 325 MG tablet Take 2 tablets (650 mg total) by mouth every 6 (six) hours as needed for mild pain (pain score 1-3), fever or headache (or Fever >/= 101).   Unknown   apixaban  (ELIQUIS ) 5 MG TABS tablet Take 5 mg by mouth.  Take 5 mg by mouth in the morning and 5 mg in the evening.   03/02/2024 at  7:00 AM   atorvastatin  (LIPITOR ) 80 MG tablet Take 80 mg by mouth.  Take 80 mg by mouth in the morning.   03/02/2024 Morning   benzonatate  (TESSALON ) 100 MG capsule Take 100 mg by mouth.   Taking   carvedilol  (COREG ) 25 MG tablet :1 Tablet(s) By Mouth Every 12 Hours   Taking   carvedilol  (COREG ) 6.25 MG tablet Take 6.25 mg by mouth.  Take 6.25 mg by mouth in the morning and 6.25 mg in the evening.   03/02/2024 Morning   cyanocobalamin  (VITAMIN B12) 1000 MCG tablet Take 1,000 mcg by mouth daily.   03/02/2024 Morning   dexlansoprazole (DEXILANT) 60 MG capsule Take 1 capsule by mouth daily.   03/02/2024 Morning   empagliflozin  (JARDIANCE ) 10 MG TABS tablet Take 1 tablet (10 mg total) by mouth daily before breakfast. 30 tablet 0 03/02/2024 Morning   Ferrous Sulfate  (IRON ) 325 (65 Fe) MG TABS Take 1 tablet by mouth daily.   03/02/2024 Morning   [Paused] hydrALAZINE  (APRESOLINE ) 50 MG tablet Take 1 tablet by mouth 3  (three) times daily.   03/02/2024 Morning   lactulose  (CHRONULAC ) 10 GM/15ML solution Taking 15-20 cc by mouth 3 to 4 times a day for constiption 946 mL 2 Unknown   spironolactone  (ALDACTONE ) 25 MG tablet Take 25 mg by mouth in the morning.   03/02/2024 Morning   Testosterone  1.62 % GEL Apply 2 Pump topically daily. One pump on each arm 225 g 3 03/02/2024 Morning   torsemide  (DEMADEX ) 20 MG tablet Take 2 tablets (40 mg total) by mouth daily. 60 tablet 0 03/02/2024 Morning   [Paused] amLODipine  (NORVASC ) 5 MG tablet Take 5 mg by mouth 2 (two) times daily. (Patient not taking: Reported on 10/28/2023)   Not Taking   aspirin  EC 81 MG tablet Take 81 mg by mouth.  Take 1 tablet (81 mg total) by mouth once daily for 1 day (Patient not taking: Reported on 03/02/2024)   Not Taking   labetalol  (NORMODYNE ) 100 MG tablet Take 0.5 tablets (50 mg total) by mouth 2 (two) times daily. (Patient not taking: Reported on 03/02/2024)   Not Taking   nitroGLYCERIN  (NITROSTAT ) 0.4 MG SL tablet Place 0.4 mg under the tongue every 5 (five) minutes as needed. (Patient not taking: Reported on 03/02/2024)   Not Taking   rosuvastatin  (CRESTOR ) 40 MG tablet Take 40 mg by mouth daily. (Patient not taking: Reported on 03/02/2024)   Not Taking   sitaGLIPtin (JANUVIA) 100 MG tablet Take 100 mg by mouth daily. (Patient not taking: Reported on 03/02/2024)   Not Taking   Social History   Socioeconomic History   Marital status: Married    Spouse name: Not on file   Number of children: Not on file   Years of education: Not on file   Highest education level: Not on file  Occupational History   Not on file  Tobacco Use   Smoking status: Former    Current packs/day: 0.00    Types: Cigarettes    Quit date: 2000    Years since quitting: 25.5    Passive exposure: Never   Smokeless tobacco: Never  Vaping Use   Vaping status: Never Used  Substance and Sexual Activity   Alcohol use: No   Drug use: No   Sexual activity: Not Currently   Other Topics Concern   Not on file  Social History Narrative   Not on file   Social Drivers of Health   Financial Resource Strain:  Low Risk  (12/12/2023)   Received from [facility]   Overall Financial Resource Strain (CARDIA)    Difficulty of Paying Living Expenses: Not hard at all  Food Insecurity: No Food Insecurity (03/02/2024)   Hunger Vital Sign    Worried About Running Out of Food in the Last Year: Never true    Ran Out of Food in the Last Year: Never true  Transportation Needs: No Transportation Needs (03/02/2024)   PRAPARE - Administrator, Civil Service (Medical): No    Lack of Transportation (Non-Medical): No  Physical Activity: Not on file  Stress: Not on file  Social Connections: Patient Declined (03/02/2024)   Social Connection and Isolation Panel    Frequency of Communication with Friends and Family: Patient declined    Frequency of Social Gatherings with Friends and Family: Patient declined    Attends Religious Services: Patient declined    Active Member of Clubs or Organizations: Patient declined    Attends Banker Meetings: Patient declined    Marital Status: Patient declined  Intimate Partner Violence: Not At Risk (03/02/2024)   Humiliation, Afraid, Rape, and Kick questionnaire    Fear of Current or Ex-Partner: No    Emotionally Abused: No    Physically Abused: No    Sexually Abused: No    Family History  Problem Relation Age of Onset   Hyperlipidemia Mother    Heart disease Mother    Hypertension Father      Vitals:   03/04/24 1000 03/04/24 1100 03/04/24 1200 03/04/24 1210  BP: (!) 136/47 127/66 131/65   Pulse: 63 (!) 58 61 (!) 55  Resp: (!) 26 20 19  (!) 24  Temp:   97.7 F (36.5 C)   TempSrc:   Oral   SpO2: 97% 97% 99% 99%  Weight:      Height:        PHYSICAL EXAM General: Well-appearing elderly male, well nourished, in no acute distress. HEENT: Normocephalic and atraumatic. Neck: No JVD.   Lungs:  Normal respiratory effort on room air. Clear bilaterally to auscultation. No wheezes, crackles, rhonchi.  Heart: HRRR. Normal S1 and S2 without gallops or murmurs.  Abdomen: Non-distended appearing.  Msk: Normal strength and tone for age. Extremities: Warm and well perfused. No clubbing, cyanosis, edema.  Neuro: Alert and oriented X 3. Psych: Answers questions appropriately.   Labs: Basic Metabolic Panel: Recent Labs    03/02/24 1314 03/02/24 2154 03/03/24 0330 03/04/24 0342  NA 139 139 141 138  K 5.2* 5.3* 5.1 5.2*  CL 106 105 107 105  CO2 24 19* 25 23  GLUCOSE 184* 248* 143* 147*  BUN 50* 50* 50* 51*  CREATININE 2.82* 2.90* 2.71* 3.05*  CALCIUM  10.2 10.5* 10.2 9.8  MG 2.3 2.5*  --   --    Liver Function Tests: No results for input(s): AST, ALT, ALKPHOS, BILITOT, PROT, ALBUMIN in the last 72 hours. No results for input(s): LIPASE, AMYLASE in the last 72 hours. CBC: Recent Labs    03/03/24 0330 03/04/24 0005 03/04/24 0342  WBC 5.9  --  7.5  HGB 7.3* 8.2* 8.1*  HCT 23.8* 25.9* 25.4*  MCV 108.2*  --  103.7*  PLT 72*  --  101*   Cardiac Enzymes: Recent Labs    03/03/24 0023 03/03/24 0811 03/03/24 0933  TROPONINIHS 102* 97* 90*   BNP: Recent Labs    03/02/24 1314  BNP 1,183.2*   D-Dimer: No results for input(s): DDIMER  in the last 72 hours. Hemoglobin A1C: Recent Labs    03/03/24 0330  HGBA1C 4.6*   Fasting Lipid Panel: No results for input(s): CHOL, HDL, LDLCALC, TRIG, CHOLHDL, LDLDIRECT in the last 72 hours. Thyroid  Function Tests: Recent Labs    03/02/24 1314  TSH 2.630   Anemia Panel: Recent Labs    03/02/24 1314 03/04/24 0924  FOLATE  --  12.6  FERRITIN 311  --   TIBC 272  --   IRON  80  --      Radiology: ECHOCARDIOGRAM COMPLETE Result Date: 03/04/2024    ECHOCARDIOGRAM REPORT   Patient Name:   [name] [facility] Date of Exam: 03/03/2024 Medical Rec #:  [id number]        Height:       72.5 in Accession #:     [id number]       Weight:       196.0 lb Date of Birth:  12-12-1945         BSA:          2.123 m Patient Age:    78 years         BP:           167/66 mmHg Patient Gender: M                HR:           62 bpm. Exam Location:  [facility] Procedure: 2D Echo, Cardiac Doppler and Color Doppler (Both Spectral and Color            Flow Doppler were utilized during procedure). Indications:     R55 Syncope                  R06.00 Dyspnea  History:         Patient has prior history of Echocardiogram examinations, most                  recent 08/23/2023. CHF, CAD, TAVR-01/23/24 @ [facility]. and Prior                  CABG, Arrythmias:LBBB, RBBB and Atrial Fibrillation,                  Signs/Symptoms:Murmur and Dyspnea; Risk Factors:Hypertension,                  Diabetes, Dyslipidemia and Former Smoker. Pulmonary                  Hypertension.  Sonographer:     Carl Coma RDCS Referring Phys:  [id number] AMY N COX Diagnosing Phys: Marsa Dooms MD IMPRESSIONS  1. Left ventricular ejection fraction, by estimation, is 60 to 65%. The left ventricle has normal function. The left ventricle has no regional wall motion abnormalities. Left ventricular diastolic parameters were normal.  2. Right ventricular systolic function is normal. The right ventricular size is normal.  3. The mitral valve is normal in structure. Moderate to severe mitral valve regurgitation. No evidence of mitral stenosis.  4. Tricuspid valve regurgitation is mild to moderate.  5. The aortic valve is normal in structure. Aortic valve regurgitation is mild. No aortic stenosis is present.  6. The inferior vena cava is normal in size with greater than 50% respiratory variability, suggesting right atrial pressure of 3 mmHg. FINDINGS  Left Ventricle: Left ventricular ejection fraction, by estimation, is 60 to 65%. The left ventricle has normal function. The left ventricle has no regional wall motion abnormalities.  Strain was performed and the global longitudinal  strain is indeterminate. The left ventricular internal cavity size was normal in size. There is no left ventricular hypertrophy. Left ventricular diastolic parameters were normal. Right Ventricle: The right ventricular size is normal. No increase in right ventricular wall thickness. Right ventricular systolic function is normal. Left Atrium: Left atrial size was normal in size. Right Atrium: Right atrial size was normal in size. Pericardium: There is no evidence of pericardial effusion. The mitral valve is normal in structure. Moderate to severe mitral valve regurgitation. No evidence of mitral valve stenosis. Tricuspid Valve: The tricuspid valve is normal in structure. Tricuspid valve regurgitation is mild to moderate. No evidence of tricuspid stenosis. The aortic valve is normal in structure. Aortic valve regurgitation is mild. No aortic stenosis is present. There is a TAVR valve present in the aortic position. Pulmonic Valve: The pulmonic valve was normal in structure. Pulmonic valve regurgitation is not visualized. No evidence of pulmonic stenosis. Aorta: The aortic root is normal in size and structure. Venous: The inferior vena cava is normal in size with greater than 50% respiratory variability, suggesting right atrial pressure of 3 mmHg. IAS/Shunts: No atrial level shunt detected by color flow Doppler. Additional Comments: 3D was performed not requiring image post processing on an independent workstation and was indeterminate.  LEFT VENTRICLE PLAX 2D LVIDd:         4.80 cm Diastology LVIDs:         3.00 cm LV e' medial:    5.93 cm/s LV PW:         1.50 cm LV E/e' medial:  28.8 LV IVS:        1.50 cm LV e' lateral:   7.83 cm/s                        LV E/e' lateral: 21.8  RIGHT VENTRICLE          IVC RV Basal diam:  5.00 cm  IVC diam: 1.90 cm TAPSE (M-mode): 1.6 cm LEFT ATRIUM              Index        RIGHT ATRIUM           Index LA diam:        5.60 cm  2.64 cm/m   RA Area:     22.70 cm LA Vol (A2C):    96.7 ml  45.54 ml/m  RA Volume:   71.40 ml  33.63 ml/m LA Vol (A4C):   104.0 ml 48.98 ml/m LA Biplane Vol: 108.0 ml 50.87 ml/m  AORTIC VALVE AV Vmax:           299.20 cm/s AV Vmean:          200.000 cm/s AV VTI:            0.602 m AV Peak Grad:      35.8 mmHg AV Mean Grad:      19.4 mmHg LVOT Vmax:         133.00 cm/s LVOT Vmean:        87.133 cm/s LVOT VTI:          0.253 m LVOT/AV VTI ratio: 0.42 AI PHT:            370 msec  AORTA Ao Root diam: 3.60 cm Ao Asc diam:  3.70 cm MITRAL VALVE                TRICUSPID  VALVE MV Area (PHT): 4.15 cm     TR Peak grad:   76.7 mmHg MV Peak grad:  14.2 mmHg    TR Vmax:        438.00 cm/s MV Mean grad:  4.5 mmHg MV Vmax:       1.89 m/s     SHUNTS MV Vmean:      96.4 cm/s    Systemic VTI: 0.25 m MV Decel Time: 183 msec MR Peak grad: 154.8 mmHg MR Mean grad: 107.0 mmHg MR Vmax:      622.00 cm/s MR Vmean:     491.5 cm/s MV E velocity: 171.00 cm/s MV A velocity: 73.30 cm/s MV E/A ratio:  2.33 Marsa Dooms MD Electronically signed by Marsa Dooms MD Signature Date/Time: 03/04/2024/8:07:07 AM    Final    US  THORACENTESIS ASP PLEURAL SPACE W/IMG GUIDE Result Date: 03/03/2024 INDICATION: 78 year old male with shortness of breath, recurrent right pleural effusion. IR was requested for therapeutic thoracentesis. EXAM: ULTRASOUND GUIDED RIGHT-SIDED THERAPEUTIC THORACENTESIS MEDICATIONS: 6 cc of 1% lidocaine  COMPLICATIONS: None immediate. PROCEDURE: An ultrasound guided thoracentesis was thoroughly discussed with the patient and questions answered. The benefits, risks, alternatives and complications were also discussed. The patient understands and wishes to proceed with the procedure. Written consent was obtained. Ultrasound was performed to localize and mark an adequate pocket of fluid in the right chest. The area was then prepped and draped in the normal sterile fashion. 1% Lidocaine  was used for local anesthesia. Under ultrasound guidance a 6 Fr Safe-T-Centesis  catheter was introduced. Thoracentesis was performed. The catheter was removed and a dressing applied. FINDINGS: A total of approximately 2.4 L of clear, straw-colored pleural fluid was removed. IMPRESSION: Successful ultrasound guided right thoracentesis yielding 2.4 L of pleural fluid. Procedure performed by Carlin Griffon, PA-C Electronically Signed   By: Cordella Banner   On: 03/03/2024 18:16   DG Chest Port 1 View Result Date: 03/03/2024 CLINICAL DATA:  Status post thoracentesis EXAM: PORTABLE CHEST 1 VIEW COMPARISON:  03/02/2024 FINDINGS: Bilateral pleural effusions with obscuration of the hemidiaphragms. The right pleural effusion appears reduced with improved aeration medially at the right lung base. No pneumothorax or complicating feature. Prior CABG. Atherosclerotic calcification of the aortic arch. Moderate enlargement of the cardiopericardial silhouette. Thoracic spondylosis. IMPRESSION: 1. Improved aeration medially at the right lung base, with reduced right pleural effusion. No pneumothorax or complicating feature. 2. Bilateral pleural effusions with obscuration of the hemidiaphragms. 3. Moderate enlargement of the cardiopericardial silhouette. 4. Prior CABG. Electronically Signed   By: Ryan Salvage M.D.   On: 03/03/2024 16:17   DG Chest 2 View Result Date: 03/02/2024 CLINICAL DATA:  Shortness of breath EXAM: CHEST - 2 VIEW COMPARISON:  Chest x-ray performed October 28, 2023 FINDINGS: Moderate right-sided pleural effusion and small left pleural effusion. Enlarged heart. IMPRESSION: 1. Moderate right pleural effusion. 2. Small left pleural effusion. 3. Enlarged heart. Electronically Signed   By: Maude Naegeli M.D.   On: 03/02/2024 13:53    ECHO as above.  01/24/2024  NORMAL LEFT VENTRICULAR SYSTOLIC FUNCTION WITH MODERATE LVH  ESTIMATED EF: >55%, CALC EF(3D): 59%  DIASTOLIC FUNCTION CAN'T BE DETERMINED  MILD RIGHT VENTRICULAR SYSTOLIC DYSFUNCTION  VALVULAR REGURGITATION: MILD AR, MILD  MR, TRIVIAL PR, MILD TR  ESTIMATED RVSP: 70 mmHg  VALVULAR STENOSIS: No AS, MILD MS, No PS, No TS   TELEMETRY reviewed by me 03/04/2024: Atrial fibrillation 50-60s  EKG reviewed by me: atrial fibrillation, RBBB, rate 64 bpm.  Data reviewed  by me 03/04/2024: last 24h vitals tele labs imaging I/O hospitalist progress notes.  Principal Problem:   Chest pain Active Problems:   BBB (bundle branch block)   Essential hypertension   OSA (obstructive sleep apnea)   S/P CABG x 3   CAD (coronary artery disease), native coronary artery   Other hyperlipidemia   S/P drug eluting coronary stent placement   Iron  deficiency anemia   Paroxysmal atrial fibrillation (HCC)   Dyspnea on exertion   Diabetes mellitus type 2, noninsulin dependent (HCC)   CKD (chronic kidney disease), stage IV (HCC)   Respiratory arrest (HCC)   Unresponsiveness   Pleural effusion    ASSESSMENT AND PLAN:  [name] is a 78 y.o. male  with a past medical history of chronic HFpEF, persistent atrial fibrillation, aortic stenosis s/p TAVR (01/2024), coronary artery disease s/p CABG x3, multiple coronary stents, most recent stent to ostial SVG in-stent restenosis (2021),  hypertension, hyperlipidemia, OSA, CKD, diabetes who presented to the ED on 03/02/2024 for chest pain and shortness of breath. Troponins found to be minimally elevated. Evening of 07/21 patient sudden became unresponsive with agonal breathing and did not respond to sternal rub, code blue was called. Patient was bagged for about 2 minutes, then became responsive with stable vitals. Lactic acid found to be elevated and patient transferred to stepdown unit. Cardiology was consulted for further evaluation.   # Acute respiratory failure with hypoxia Patient responded after about 2 minutes of bag valve assisted ventilations. Trops drawn again after this event and troponins 85 > 102>97 > 90. Lactate 6.2 > 1.1. Improved status, weaned to room air.   # CAD s/p CABG  x3, multiple coronary stents # Hypertension  # Hyperlipidemia  # Likely demand ischemia Patient presents with chest pain, SOB. EKG without acute ischemic changes. Trops minimally elevated and flat 35 > 36. Trops drawn after unresponsive event 85 > 102>97 > 90.. With lactic of 6.2 > 1.  Patient ambulated today with no concern, patient denies any chest pain or SOB. -Continue home atorvastatin  80 mg daily.  -Continue coreg  as stated below.  -Continue home amlodipine  5 mg BID. -Will resume home hydralazine  50 mg TID if BP remains elevated.   - No plan for inpatient LHC at this time due to elevated kidney function. Patient will have close follow-up with outpatient cardiology to discuss possible outpatient LHC at [facility] due to patient's recent TAVR.  Dr. Ammon discussed case with Dr. Katina and is agreeable to this plan.  # Aortic Stenosis s/p TAVR (01/2024) Echo s/p TAVR on 01/2024 with pEF, moderate LVH, no AS.  Echo this admission with no aortic stenosis present. -Scheduled close outpatient cardiology follow-up.   # Acute on chronic HFpEF # CKD stage IV BNP elevated at 1100. CXR with bilateral pleural effusions. Patient home dose diuretic is torsemide  20 mg M/W/S. Patient without SOB or LEE.  At this admission with preserved EF and no RWMA.  Patient appears euvolemic on exam. -Resume home torsemide  at discharge.  -Continue home empagliflozin  10 mg daily.  -Hold off on resuming home spironolactone , GFR < 30.  -Continue home Coreg  3/125 mg BID.   # Persistent atrial fibrillation EKG with atrial fibrillation, RBBB, rate 64 bpm. Per tele rate controlled atrial fibrillation, 50-60s. -Continue home Eliquis  for stroke risk reduction.  -Continue Coreg  as stated above.  Patient ambulated today with no concerns of chest pain or SOB.  Patient eager to go home.  Ok for discharge today from  a cardiac perspective.  Follow-up with Dr. Florencio on 07/29.  This patient's plan of care was discussed and  created with Dr. Ammon and he is in agreement.  Signed: Dorene Comfort, PA-C  03/04/2024, 12:52 PM [facility]

## 2024-03-04 NOTE — Progress Notes (Signed)
 Notified Dr. Caleen- that patient walked around the entire ICU once- patient had no complaints of pain- he was mildly short of breath towards the end of the walk.  Oxygen saturation dropped to 90% on room air.  Once patient was back in room and sat on edge of bed- his oxygen was back to 99% on room air.

## 2024-03-04 NOTE — Discharge Summary (Addendum)
 Physician Discharge Summary   Patient: [name] MRN: [id number] DOB: 29-Apr-1946  Admit date:     03/02/2024  Discharge date: 03/04/24  Discharge Physician: Amaryllis Dare   PCP: Pcp, No   Recommendations at discharge:  Please obtain CBC and BMP in next couple of days, patient need a repeat potassium level. Follow-up with nephrology Follow-up with cardiology  Discharge Diagnoses: Principal Problem:   Chest pain Active Problems:   CAD (coronary artery disease), native coronary artery   Paroxysmal atrial fibrillation (HCC)   CKD (chronic kidney disease), stage IV (HCC)   BBB (bundle branch block)   Essential hypertension   OSA (obstructive sleep apnea)   S/P CABG x 3   Other hyperlipidemia   S/P drug eluting coronary stent placement   Iron  deficiency anemia   Dyspnea on exertion   Diabetes mellitus type 2, noninsulin dependent (HCC)   Respiratory arrest (HCC)   Unresponsiveness   Pleural effusion   Hospital Course: 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  Vitals in the ED showed tof 98.1, rr of 40 initially and now at 24, heart rate 68, blood pressure 167/66, SpO2 98% on room air.  Serum sodium is 139, potassium 4.2, chloride 106, bicarb 24, BUN of 50, serum creatinine of 2.82, EGFR 22, nonfasting glucose 184, WBC 7.1, hemoglobin 8.1, platelet 129.troponin 35.  EKG showed A-fib with controlled ventricular response, RBBB and left intrafascicular block.  On 7/21 around 10 PM patient has a syncopal episode and became unresponsive.  Initially rapid response followed by [facility] was called and he was bagged for about 2 minutes.  Consequently he regained consciousness and his vital sign improved.  He was transferred to  stepdown for close monitoring.  Repeat echocardiogram ordered.  Found to have bilateral pleural effusion, more on right s/p thoracentesis with removal of 2.4 L of fluid.  Patient was also given 1 unit of PRBC on 7/22.  7/23: Vital stable.  Labs with slight worsening renal function with creatinine at 3.05, potassium of 5.2, hemoglobin 8.1 s/p 1 unit of PRBC.  Repeat echocardiogram on 7/22 with normal EF and diastolic function.  Moderate to severe MR.  Patient was able to ambulate without any chest pain.  Cardiology was recommending follow-up at [facility] which they have already arranged for further workup as he recently has a TAVR.  Patient was eager to go home and cardiology cleared him for discharge with a close outpatient follow-up.  His home amlodipine  was restarted, spironolactone  was held due to borderline hyperkalemia.  He was given Lokelma  and will resume his home torsemide .  Patient was instructed to follow-up closely with his nephrologist and cardiologist.  Need to have a repeat potassium levels in the next couple of days. His home carvedilol  dose was decreased.  Patient will continue on current medications and need to have a close follow-up with his providers for further assistance.   Consultants: Cardiology Procedures performed: None Disposition: Home Diet recommendation:  Discharge Diet Orders (From admission, onward)     Start     Ordered   03/04/24 0000  Diet - low sodium heart healthy        03/04/24 1334           Cardiac diet DISCHARGE MEDICATION: Allergies as of 03/04/2024  Reactions   Isosorbide  Cough        Medication List     PAUSE taking these medications    hydrALAZINE  50 MG tablet Wait to take this until your doctor or other care provider tells you to start again. Commonly known as: APRESOLINE  Take 1 tablet by mouth 3 (three) times daily.   spironolactone  25 MG tablet Wait to take this until your doctor or other care provider tells you to  start again. Commonly known as: ALDACTONE  Take 25 mg by mouth in the morning.       STOP taking these medications    aspirin  EC 81 MG tablet   labetalol  100 MG tablet Commonly known as: NORMODYNE    rosuvastatin  40 MG tablet Commonly known as: CRESTOR    sitaGLIPtin 100 MG tablet Commonly known as: JANUVIA       TAKE these medications    acetaminophen  325 MG tablet Commonly known as: TYLENOL  Take 2 tablets (650 mg total) by mouth every 6 (six) hours as needed for mild pain (pain score 1-3), fever or headache (or Fever >/= 101).   amLODipine  5 MG tablet Commonly known as: NORVASC  Take 5 mg by mouth 2 (two) times daily.   apixaban  5 MG Tabs tablet Commonly known as: ELIQUIS  Take 5 mg by mouth.  Take 5 mg by mouth in the morning and 5 mg in the evening.   atorvastatin  80 MG tablet Commonly known as: LIPITOR  Take 80 mg by mouth.  Take 80 mg by mouth in the morning.   benzonatate  100 MG capsule Commonly known as: TESSALON  Take 100 mg by mouth.   carvedilol  3.125 MG tablet Commonly known as: COREG  Take 1 tablet (3.125 mg total) by mouth 2 (two) times daily with a meal. What changed:  medication strength how much to take when to take this additional instructions Another medication with the same name was removed. Continue taking this medication, and follow the directions you see here.   cyanocobalamin  1000 MCG tablet Commonly known as: VITAMIN B12 Take 1,000 mcg by mouth daily.   dexlansoprazole 60 MG capsule Commonly known as: DEXILANT Take 1 capsule by mouth daily.   Iron  325 (65 Fe) MG Tabs Take 1 tablet by mouth daily.   Jardiance  10 MG Tabs tablet Generic drug: empagliflozin  Take 1 tablet (10 mg total) by mouth daily before breakfast.   lactulose  10 GM/15ML solution Commonly known as: CHRONULAC  Taking 15-20 cc by mouth 3 to 4 times a day for constiption   nitroGLYCERIN  0.4 MG SL tablet Commonly known as: NITROSTAT  Place 0.4 mg under the tongue  every 5 (five) minutes as needed.   sodium zirconium cyclosilicate  5 g packet Commonly known as: LOKELMA  Take 10 g by mouth daily for 3 days. Start taking on: March 05, 2024   Testosterone  1.62 % Gel Apply 2 Pump topically daily. One pump on each arm   torsemide  20 MG tablet Commonly known as: DEMADEX  Take 2 tablets (40 mg total) by mouth daily.        Follow-up Information     Dix, Caralyn, PA-C. Go in 1 week(s).   Specialty: Cardiology Contact information: [address] [place] [address] [phone]                Discharge Exam: Fredricka Weights   03/02/24 1309  Weight: 88.9 kg   General.  Well-developed elderly man, in no acute distress. Pulmonary.  Lungs clear bilaterally, normal respiratory effort. CV.  Regular rate and rhythm,  Abdomen.  Soft, nontender, nondistended, BS positive. CNS.  Alert and oriented .  No focal neurologic deficit. Extremities.  No edema, pulses intact and symmetrical. Psychiatry.  Judgment and insight appears normal.   Condition at discharge: stable  The results of significant diagnostics from this hospitalization (including imaging, microbiology, ancillary and laboratory) are listed below for reference.   Imaging Studies: ECHOCARDIOGRAM COMPLETE Result Date: 03/04/2024    ECHOCARDIOGRAM REPORT   Patient Name:   ABDIEL BLACKERBY [facility] Date of Exam: 03/03/2024 Medical Rec #:  [id number]        Height:       72.5 in Accession #:    [id number]       Weight:       196.0 lb Date of Birth:  1945-08-27         BSA:          2.123 m Patient Age:    78 years         BP:           167/66 mmHg Patient Gender: M                HR:           62 bpm. Exam Location:  [facility] Procedure: 2D Echo, Cardiac Doppler and Color Doppler (Both Spectral and Color            Flow Doppler were utilized during procedure). Indications:     R55 Syncope                  R06.00 Dyspnea  History:         Patient has prior history of Echocardiogram examinations, most                   recent 08/23/2023. CHF, CAD, TAVR-01/23/24 @ [facility]. and Prior                  CABG, Arrythmias:LBBB, RBBB and Atrial Fibrillation,                  Signs/Symptoms:Murmur and Dyspnea; Risk Factors:Hypertension,                  Diabetes, Dyslipidemia and Former Smoker. Pulmonary                  Hypertension.  Sonographer:     Carl Coma RDCS Referring Phys:  [id number] AMY N COX Diagnosing Phys: Marsa Dooms MD IMPRESSIONS  1. Left ventricular ejection fraction, by estimation, is 60 to 65%. The left ventricle has normal function. The left ventricle has no regional wall motion abnormalities. Left ventricular diastolic parameters were normal.  2. Right ventricular systolic function is normal. The right ventricular size is normal.  3. The mitral valve is normal in structure. Moderate to severe mitral valve regurgitation. No evidence of mitral stenosis.  4. Tricuspid valve regurgitation is mild to moderate.  5. The aortic valve is normal in structure. Aortic valve regurgitation is mild. No aortic stenosis is present.  6. The inferior vena cava is normal in size with greater than 50% respiratory variability, suggesting right atrial pressure of 3 mmHg. FINDINGS  Left Ventricle: Left ventricular ejection fraction, by estimation, is 60 to 65%. The left ventricle has normal function. The left ventricle has no regional wall motion abnormalities. Strain was performed and the global longitudinal strain is indeterminate. The left ventricular internal cavity size was normal in size. There is no left ventricular hypertrophy. Left ventricular diastolic  parameters were normal. Right Ventricle: The right ventricular size is normal. No increase in right ventricular wall thickness. Right ventricular systolic function is normal. Left Atrium: Left atrial size was normal in size. Right Atrium: Right atrial size was normal in size. Pericardium: There is no evidence of pericardial effusion. The mitral valve is  normal in structure. Moderate to severe mitral valve regurgitation. No evidence of mitral valve stenosis. Tricuspid Valve: The tricuspid valve is normal in structure. Tricuspid valve regurgitation is mild to moderate. No evidence of tricuspid stenosis. The aortic valve is normal in structure. Aortic valve regurgitation is mild. No aortic stenosis is present. There is a TAVR valve present in the aortic position. Pulmonic Valve: The pulmonic valve was normal in structure. Pulmonic valve regurgitation is not visualized. No evidence of pulmonic stenosis. Aorta: The aortic root is normal in size and structure. Venous: The inferior vena cava is normal in size with greater than 50% respiratory variability, suggesting right atrial pressure of 3 mmHg. IAS/Shunts: No atrial level shunt detected by color flow Doppler. Additional Comments: 3D was performed not requiring image post processing on an independent workstation and was indeterminate.  LEFT VENTRICLE PLAX 2D LVIDd:         4.80 cm Diastology LVIDs:         3.00 cm LV e' medial:    5.93 cm/s LV PW:         1.50 cm LV E/e' medial:  28.8 LV IVS:        1.50 cm LV e' lateral:   7.83 cm/s                        LV E/e' lateral: 21.8  RIGHT VENTRICLE          IVC RV Basal diam:  5.00 cm  IVC diam: 1.90 cm TAPSE (M-mode): 1.6 cm LEFT ATRIUM              Index        RIGHT ATRIUM           Index LA diam:        5.60 cm  2.64 cm/m   RA Area:     22.70 cm LA Vol (A2C):   96.7 ml  45.54 ml/m  RA Volume:   71.40 ml  33.63 ml/m LA Vol (A4C):   104.0 ml 48.98 ml/m LA Biplane Vol: 108.0 ml 50.87 ml/m  AORTIC VALVE AV Vmax:           299.20 cm/s AV Vmean:          200.000 cm/s AV VTI:            0.602 m AV Peak Grad:      35.8 mmHg AV Mean Grad:      19.4 mmHg LVOT Vmax:         133.00 cm/s LVOT Vmean:        87.133 cm/s LVOT VTI:          0.253 m LVOT/AV VTI ratio: 0.42 AI PHT:            370 msec  AORTA Ao Root diam: 3.60 cm Ao Asc diam:  3.70 cm MITRAL VALVE                 TRICUSPID VALVE MV Area (PHT): 4.15 cm     TR Peak grad:   76.7 mmHg MV Peak grad:  14.2 mmHg    TR Vmax:  438.00 cm/s MV Mean grad:  4.5 mmHg MV Vmax:       1.89 m/s     SHUNTS MV Vmean:      96.4 cm/s    Systemic VTI: 0.25 m MV Decel Time: 183 msec MR Peak grad: 154.8 mmHg MR Mean grad: 107.0 mmHg MR Vmax:      622.00 cm/s MR Vmean:     491.5 cm/s MV E velocity: 171.00 cm/s MV A velocity: 73.30 cm/s MV E/A ratio:  2.33 Marsa Dooms MD Electronically signed by Marsa Dooms MD Signature Date/Time: 03/04/2024/8:07:07 AM    Final    US  THORACENTESIS ASP PLEURAL SPACE W/IMG GUIDE Result Date: 03/03/2024 INDICATION: 78 year old male with shortness of breath, recurrent right pleural effusion. IR was requested for therapeutic thoracentesis. EXAM: ULTRASOUND GUIDED RIGHT-SIDED THERAPEUTIC THORACENTESIS MEDICATIONS: 6 cc of 1% lidocaine  COMPLICATIONS: None immediate. PROCEDURE: An ultrasound guided thoracentesis was thoroughly discussed with the patient and questions answered. The benefits, risks, alternatives and complications were also discussed. The patient understands and wishes to proceed with the procedure. Written consent was obtained. Ultrasound was performed to localize and mark an adequate pocket of fluid in the right chest. The area was then prepped and draped in the normal sterile fashion. 1% Lidocaine  was used for local anesthesia. Under ultrasound guidance a 6 Fr Safe-T-Centesis catheter was introduced. Thoracentesis was performed. The catheter was removed and a dressing applied. FINDINGS: A total of approximately 2.4 L of clear, straw-colored pleural fluid was removed. IMPRESSION: Successful ultrasound guided right thoracentesis yielding 2.4 L of pleural fluid. Procedure performed by Carlin Griffon, PA-C Electronically Signed   By: Cordella Banner   On: 03/03/2024 18:16   DG Chest Port 1 View Result Date: 03/03/2024 CLINICAL DATA:  Status post thoracentesis EXAM: PORTABLE CHEST 1  VIEW COMPARISON:  03/02/2024 FINDINGS: Bilateral pleural effusions with obscuration of the hemidiaphragms. The right pleural effusion appears reduced with improved aeration medially at the right lung base. No pneumothorax or complicating feature. Prior CABG. Atherosclerotic calcification of the aortic arch. Moderate enlargement of the cardiopericardial silhouette. Thoracic spondylosis. IMPRESSION: 1. Improved aeration medially at the right lung base, with reduced right pleural effusion. No pneumothorax or complicating feature. 2. Bilateral pleural effusions with obscuration of the hemidiaphragms. 3. Moderate enlargement of the cardiopericardial silhouette. 4. Prior CABG. Electronically Signed   By: Ryan Salvage M.D.   On: 03/03/2024 16:17   DG Chest 2 View Result Date: 03/02/2024 CLINICAL DATA:  Shortness of breath EXAM: CHEST - 2 VIEW COMPARISON:  Chest x-ray performed October 28, 2023 FINDINGS: Moderate right-sided pleural effusion and small left pleural effusion. Enlarged heart. IMPRESSION: 1. Moderate right pleural effusion. 2. Small left pleural effusion. 3. Enlarged heart. Electronically Signed   By: Maude Naegeli M.D.   On: 03/02/2024 13:53    Microbiology: Results for orders placed or performed during the hospital encounter of 03/02/24  Resp panel by RT-PCR (RSV, Flu A&B, Covid) Anterior Nasal Swab     Status: None   Collection Time: 03/02/24  4:17 PM   Specimen: Anterior Nasal Swab  Result Value Ref Range Status   SARS Coronavirus 2 by RT PCR NEGATIVE NEGATIVE Final    Comment: (NOTE) SARS-CoV-2 target nucleic acids are NOT DETECTED.  The SARS-CoV-2 RNA is generally detectable in upper respiratory specimens during the acute phase of infection. The lowest concentration of SARS-CoV-2 viral copies this assay can detect is 138 copies/mL. A negative result does not preclude SARS-Cov-2 infection and should not be used as the  sole basis for treatment or other patient management decisions. A  negative result may occur with  improper specimen collection/handling, submission of specimen other than nasopharyngeal swab, presence of viral mutation(s) within the areas targeted by this assay, and inadequate number of viral copies(<138 copies/mL). A negative result must be combined with clinical observations, patient history, and epidemiological information. The expected result is Negative.  Fact Sheet for Patients:  BloggerCourse.com  Fact Sheet for Healthcare Providers:  SeriousBroker.it  This test is no t yet approved or cleared by the [place]  FDA and  has been authorized for detection and/or diagnosis of SARS-CoV-2 by FDA under an Emergency Use Authorization (EUA). This EUA will remain  in effect (meaning this test can be used) for the duration of the COVID-19 declaration under Section 564(b)(1) of the Act, 21 U.S.C.section 360bbb-3(b)(1), unless the authorization is terminated  or revoked sooner.       Influenza A by PCR NEGATIVE NEGATIVE Final   Influenza B by PCR NEGATIVE NEGATIVE Final    Comment: (NOTE) The Xpert Xpress SARS-CoV-2/FLU/RSV plus assay is intended as an aid in the diagnosis of influenza from Nasopharyngeal swab specimens and should not be used as a sole basis for treatment. Nasal washings and aspirates are unacceptable for Xpert Xpress SARS-CoV-2/FLU/RSV testing.  Fact Sheet for Patients: BloggerCourse.com  Fact Sheet for Healthcare Providers: SeriousBroker.it  This test is not yet approved or cleared by the [place]  FDA and has been authorized for detection and/or diagnosis of SARS-CoV-2 by FDA under an Emergency Use Authorization (EUA). This EUA will remain in effect (meaning this test can be used) for the duration of the COVID-19 declaration under Section 564(b)(1) of the Act, 21 U.S.C. section 360bbb-3(b)(1), unless the authorization  is terminated or revoked.     Resp Syncytial Virus by PCR NEGATIVE NEGATIVE Final    Comment: (NOTE) Fact Sheet for Patients: BloggerCourse.com  Fact Sheet for Healthcare Providers: SeriousBroker.it  This test is not yet approved or cleared by the [place]  FDA and has been authorized for detection and/or diagnosis of SARS-CoV-2 by FDA under an Emergency Use Authorization (EUA). This EUA will remain in effect (meaning this test can be used) for the duration of the COVID-19 declaration under Section 564(b)(1) of the Act, 21 U.S.C. section 360bbb-3(b)(1), unless the authorization is terminated or revoked.  Performed at [facility] [address] [place], [place] [address]   MRSA Next Gen by PCR, Nasal     Status: None   Collection Time: 03/02/24 11:40 PM   Specimen: Nasal Mucosa; Nasal Swab  Result Value Ref Range Status   MRSA by PCR Next Gen NOT DETECTED NOT DETECTED Final    Comment: (NOTE) The GeneXpert MRSA Assay (FDA approved for NASAL specimens only), is one component of a comprehensive MRSA colonization surveillance program. It is not intended to diagnose MRSA infection nor to guide or monitor treatment for MRSA infections. Test performance is not FDA approved in patients less than 41 years old. Performed at [facility], [address] [place], [place] [address]     Labs: CBC: Recent Labs  Lab 03/02/24 1314 03/02/24 2154 03/03/24 0330 03/04/24 0005 03/04/24 0342  WBC 7.1 8.1 5.9  --  7.5  HGB 8.1* 8.9* 7.3* 8.2* 8.1*  HCT 26.0* 28.4* 23.8* 25.9* 25.4*  MCV 107.9* 109.2* 108.2*  --  103.7*  PLT 129* 113* 72*  --  101*   Basic Metabolic Panel: Recent Labs  Lab 03/02/24 1314 03/02/24 2154 03/03/24  0330 03/04/24 0342  NA 139 139 141 138  K 5.2* 5.3* 5.1 5.2*  CL 106 105 107 105  CO2 24 19* 25 23  GLUCOSE 184* 248* 143* 147*  BUN 50* 50* 50* 51*  CREATININE 2.82* 2.90*  2.71* 3.05*  CALCIUM  10.2 10.5* 10.2 9.8  MG 2.3 2.5*  --   --    Liver Function Tests: No results for input(s): AST, ALT, ALKPHOS, BILITOT, PROT, ALBUMIN in the last 168 hours. CBG: Recent Labs  Lab 03/03/24 1250 03/03/24 1629 03/03/24 2122 03/04/24 0719 03/04/24 1121  GLUCAP 193* 119* 201* 133* 165*    Discharge time spent: greater than 30 minutes.  This record has been created using Conservation officer, historic buildings. Errors have been sought and corrected,but may not always be located. Such creation errors do not reflect on the standard of care.   Signed: Amaryllis Dare, MD [facility] 03/04/2024

## 2024-03-04 NOTE — Care Management Important Message (Signed)
 Important Message  Patient Details  Name: MICHEL HENDON MRN: [id number] Date of Birth: 1945/09/30   Important Message Given:  Yes - Medicare IM     [name] 03/04/2024, 12:27 PM

## 2024-03-05 ENCOUNTER — Inpatient Hospital Stay: Admitting: Hospice and Palliative Medicine

## 2024-03-05 ENCOUNTER — Inpatient Hospital Stay: Attending: Oncology

## 2024-03-06 ENCOUNTER — Inpatient Hospital Stay

## 2024-03-06 LAB — TYPE AND SCREEN
ABO/RH(D): A POS
Antibody Screen: NEGATIVE
Unit division: 0

## 2024-03-06 LAB — BPAM RBC
Blood Product Expiration Date: 202508222359
ISSUE DATE / TIME: 202507221701
Unit Type and Rh: 6200

## 2024-03-09 ENCOUNTER — Other Ambulatory Visit

## 2024-03-10 ENCOUNTER — Inpatient Hospital Stay

## 2024-03-13 HISTORY — PX: CORONARY STENT INTERVENTION: CATH118234

## 2024-03-22 NOTE — Discharge Summary (Signed)
 [facility]           [facility]) Discharge Summary   Admit Date: 03/16/2024  Discharge Date: 03/22/2024  Admitting Physician: Rockney Matsu, MD  Discharge Physician: Matsu Rockney, MD  Primary Care Provider: [name], MD  Primary Cardiologist: Arthea Coca, Chetan Patel    Discharge Destination: Home  Discharge Services: none  Code Status: Full Code   Admission Diagnoses:  Chest pain, unspecified type [R07.9]  Discharge Diagnoses:  Principal Problem:   CAD (coronary artery disease), native coronary artery Active Problems:   Essential hypertension   S/P CABG x 3   S/P drug eluting coronary stent placement   Acute heart failure with preserved ejection fraction (HFpEF) (CMS/HHS-HCC)   Paroxysmal atrial fibrillation (CMS/HHS-HCC)   Cirrhosis (CMS/HHS-HCC)   Stage 4 chronic kidney disease (CMS/HHS-HCC)   S/P TAVR (transcatheter aortic valve replacement) Resolved Problems:   * No resolved hospital problems. *     Anticipatory Guidance (key med changes, results pending, future labs, IV therapies): Your medications have changed and we have given you an updated list of the medication. It is important that you keep your BP well controlled. You will take aspirin for a total of 7 days then stop this medication. You will also take eliquis and plavix that you should continue taking. You should see your primary care doctor or nephrologist within one week for a recheck BMP and evaluation.    Cardiac Rehab: ordered  Patient Discharge Instructions:   If you smoke (or have smoked within the last year), we strongly recommend that you do not smoke.   Weigh yourself daily and record   Low cholesterol, low fat   Notify cardiology provider of chest pain   Notify provider of swelling in arms, legs, or stomach   Notify provider temperature greater than 101.0 F (38.3 C) degrees   Notify provider of weight gain  greater than 2 lbs in 1 day or 5 lbs in 1 week   Report questions or concerns to the [facility] at [phone]   Notify primary care physician of other symptoms   For a life-threatening emergency, call 911   Follow-up with Primary Care Provider   Follow-up with Cardiology  Order Comments: Please schedule fwollo-up with either cardiology or PCP by the end of this week (03/27/24)    [facility] Provider Follow-up: Future Appointments  Date Time Provider Department Center  04/20/2024 10:45 AM Ammon, Marsa, MD [facility]OCK[facility]  07/21/2024  2:30 PM Bluford Dorn Holts, MD [address]    Non-[facility] Provider Follow-up: Follow-up with PCP or nephrologist within one week for Cr/K recheck  Report Issues: By calling PCP, cardiologist or [facility] at [phone].    Allergies/Intolerances:  Allergies  Allergen Reactions  . Isosorbide Cough     Medications:     Discharge Medications     PAUSE taking these medications      Details  spironolactone 25 MG tablet Wait to take this until your doctor or other care provider tells you to start again. Commonly known as: ALDACTONE What changed:  how much to take when to take this  25 mg, Oral, Daily Quantity: 90 tablet Refills: 3       New Medications      Details  aspirin 81 MG EC tablet Start taking on: March 23, 2024  81 mg, Oral, Daily Quantity: 5 tablet Refills: 0   clopidogreL 75 mg tablet Commonly known as: PLAVIX Start taking on: March 23, 2024  75 mg, Oral, Daily Quantity: 30 tablet Refills: 11   isosorbide mononitrate 120 MG ER tablet Commonly known as: IMDUR Start taking on: March 23, 2024  120 mg, Oral, Daily Quantity: 30 tablet Refills: 11   valsartan 40 MG tablet Commonly known as: DIOVAN  40 mg, Oral, Every 12 hours Quantity: 60 tablet Refills: 11       Modified Medications      Details  amLODIPine 10 MG tablet Commonly known as: NORVASC Start taking on:  March 23, 2024 What changed:  medication strength how much to take when to take this  10 mg, Oral, Daily Quantity: 30 tablet Refills: 11   TORsemide 20 MG tablet Commonly known as: DEMADEX What changed:  when to take this additional instructions  20 mg, Oral, Daily Quantity: 30 tablet Refills: 11       Medications To Continue      Details  acetaminophen 325 MG tablet Commonly known as: TYLENOL  650 mg, Every 6 hours PRN Refills: 0   apixaban 5 mg tablet Commonly known as: ELIQUIS  5 mg, Oral, Every 12 hours Refills: 0   atorvastatin 80 MG tablet Commonly known as: LIPITOR  80 mg, Oral, Daily Quantity: 90 tablet Refills: 3   carvediloL 3.125 MG tablet Commonly known as: COREG  3.125 mg, 2 times Daily with meals Refills: 0   cyanocobalamin 1000 MCG tablet Commonly known as: VITAMIN B12  1,000 mcg, Daily Refills: 0   dexlansoprazole 60 mg DR capsule Commonly known as: DEXILANT  60 mg, Oral, Daily Quantity: 90 capsule Refills: 3   empagliflozin 10 mg tablet Commonly known as: JARDIANCE  10 mg, Daily before breakfast Refills: 0   ferrous sulfate 325 (65 FE) MG tablet  325 mg, Daily with breakfast Refills: 0   lactulose 10 gram/15 mL oral solution Commonly known as: ENULOSE  12.5 mLs, 2 times Daily PRN Refills: 0   ONETOUCH ULTRA BLUE TEST STRIP test strip Generic drug: blood glucose diagnostic  Refills: 0   testosterone 20.25 mg/1.25 gram (1.62 %) gel in metered dose pump Commonly known as: ANDROGEL  1 Pump, Transdermal, of each shoulder daily as needed  Refills: 0       Stopped Medications    hydrALAZINE 50 MG tablet Commonly known as: APRESOLINE         Anticoagulation: Prescribed INR goal: Not applicable Aspirin: Prescribed P2Y12-I: Prescribed High Intensity Statin: Prescribed ACE/ARB/ARNI: Prescribed Beta Blocker: Prescribed Nitroglycerin: Not indicated MRA: Prescribed HELD ON DISCHARGE PENDING KIDNEY FUNCTION TESTS  OUTPATIENT   Brief History of Present Illness: Per the H&P dated on 03/16/2024: [name] is a 78 y.o. male with PMH significant for DM, CKD, AF on Eliquis, OSA, GERD, HTN, HLD, hypothyroidism, CAD s/p CABG and subsequent stents, and recent TAVR 01/23/24    He reports experiencing 4 days of worsening chest discomfort, which he describes as throbbing and pressure-like. He reports that this pain is worsened with activity, where is worsened to a 12/10 severity, and improves with rest to a 1-2/10. Non-radiating, not taken meds. He notes worsening lower leg edema recently, as well as worsening SOB with exertion. Normally can ambulate with no functional limitation, including walking up stairs and to store multiple blocks from home. Today, his pain and SOB limit him to barely being able to ambulate across the room. Denies f/c,  light-headedness/pre-syncope, n/v/d, orthopnea, any other sx.    Of note, he was recently hospitalized in OSH from 07/21-07/23/25 with similar symptoms and noted to have R-sided pleural effusion and anemia. Received 1U transfusion and thoracentesis with 2L fluid removed with improvement to his symptoms.    He recently was reduced in his diuretic regiment from 20 mg Torsemide daily to 20 mg Torsemide MWF, last dose Friday 03/13/24   _____________________  Hospital Course by Problem:   #Unstable Angina #Coronary Artery Disease s/p CABG and multiple PCIs (See below) #S/p Repeat PCI to SVG-PDA  CABG 3 (08/2016, [facility], Dr. Trudy)  Grafts: LIMA to LAD, SVG to PDA, SVG to OM3.   2018 cath: SVG to OM3 occluded, 70% RCA stenosis, possible LIMA-LAD distal stenosis. PCI to SVG-PDA (3.5 x 22 mm Resolute Integrity drug-eluting stent to 16-20 atm) and attempted LAD via LIMA (unsuccessful due to distal tortuosity). Left radial access and MPA guide..   May 2018 cath: R femoral access and XB 3.5 guide. LAD 80-90% diffuse disease, LCx 70%, distal circumflex 95%; PCI to Lcx (3.0 x 12 mm  Xience Alpine drug-eluting stent, 3.5 x 22 mm Resolute Onyx drug-eluting stent, 3.5 x 8 mm Resolute Onyx drug-eluting stent) and distal LAD (2.0 x 20 mm Resolute Onyx drug-eluting stent to 16 atm across LIMA anastomosis, 3.0 x 26 mm Resoulte Onyx drug-eluting stent to 18 atm in proximal LAD).   October 2021 cath [facility], RFA: PCI PDA  A drug-eluting stent was successfully placed using a STENT RESOLUTE ONYX  2.5X18 SVG-PDA Grafts LIMA to LAD incompletely evaluated probably occluded  SVG to circumflex 100% occluded  SVG to PDA 95% ostial in-stent TIMI-3 flow   03/20/24 (Narcisse) 1. Coronary angiogram revealed: Mild to moderate LCx disease, Severe distal LM/ostial LAD calcified disease (80-90%). Patent stents in LCx/ and LAD. LIMA is patent but anastomosis has been stented in native LAD, so no flow into LAD. SVG - PDA has severe  stenosis in aorto-ostial takeoff where there have been DES x  Elected to proceed with PCI of SVG-PDA after discussion with patient's primary cardiologist Dr. Cherylin. Excellent angiographic result  LESION: Ostium of SVG to PDA 95% to Normal (Previously dilated lesion)      GUIDE WIRES:            RUNTHROUGH NS CORONARY .[id number]      Pre BALLOONS:           DILAT BALLOON EUPHORA 2.5X12MM;   Max inflation: 16 atm;            [place] EUPHORA 3.00X12MM;   Max inflation: 20 atm;  -IVUS reveal severe concentric calcified ISR, double layer of stent with prior stent severely undersized. distal reference 3.7 mm - Elected to treat further given calcium           [place] C2+ IVL 3.0X12MM;   Max inflation: 6 atm;            [place] EUPHORA 3.50X12MM;   Max inflation: 20 atm;       STENTS:           SYNERGY MEGATRON 3.5X28MM;   Max inflation: 20 atm;       Pre BALLOONS:           [place] EUPHORA 4.00X12MM;   Max inflation: 20 atm;  Patient presented with anginal chest pain and shortness of breath. Troponins negative on arrival, no acute ischemic changes. Initially medically managed  with improvement in blood pressure with some improvement in anginal  symptoms. However, given improved but ongoing chest pain he was taken to the cath lab which demonstrated ostial lesion of LIMA graft supplying the RCA with prior stenting. This area was ballooned and restented with good outcomes. Patient reported symptomatic improvement with this. - continue antianginal therapy: coreg  3.125mg  (sinus pause with higher doses), amlodipine  10mg  QD, imdur  120mg  QD - continue lipitor  80mg  QD (last LDL 34) - Triple therapy (ASA,plavix ,eliquis ) x1 week, then stop aspiring and continue dual therapy (plavix ,eliquis ) for at least 6 months - Of note, patient has a residual LM-ostial LAD lesion which may cause symptoms in the future  #Acute on chronic HFpEF #Severe AS s/p TAVR #Moderate to Severe MR #Recurrent pleural effusion Patient with known history of HFpEF with unclear etiology. Multiple hospitalization in past 6 months with prior hospitaliztion to heart failure service 4/30-5/9. Responded to IV diuresi and work-up at that time unrevealing for underlying etiology (EMBx 5/7 negative for amyloid. FLC: kappa 9.3 (H), lambda 5.7 (H), ratio 1.63 (WNL). IFE with monoclonal gammopathy IgG-kappa. EDW 87kg). Course also complicated by recurrent pleural effusions requiring prior drainage. Patient presented mildly volume overloaded and responded to intermittent IV diuresis. TTE performed with now moderate from mild MR, moderate paravalvular AR with preserved EF. Underwent thoracentesis with drainage of 1.6L that was exudative by lights criteria, consistent with prior tap. Culture and cytology pending at time of discharge, though prior culture/cytology negative and patient has no red flag symptoms to raise hightened concern for current infection or malignancy. His medication regimen was slightly altered during his hospitalization and discussed with him prior to discharge. - empagliflozin  10mg  QD, valsartan 40mg  BID,  torsemide  20mg  QD - follow-up cytology, culture  #Cirrhosis  Unclear chart history of cirrhosis with intermittent prior documentation of maybe cirrhosis so underwent RUQ US  during this admission that was consistent with cirrhosis with sequela of portal hypertension. Prior RHC earlier this year had demonstrated low hepatic wedge pressure. Unclear etiology. Patient well compensated on exam and was discharged for further outpatient workup. - Follow-up with hepatology outpatient.  CKD stage IV Baseline creatinine 2.6. Follows with nephrology outpatient.  Comorbid Conditions: Nutritional Disorders:    Hypoalbuminemia:  Hypoalbuminemia present with lowest albumin of 3.1.   Hypoalbuminemia is associated with increased risk for patients.  We will attempt to treat the underlying condition(s) contributing to this low albumin state. Electrolyte Disorders:    Hyperkalemia:  Hyperkalemia present with highest potassium of 5.3.  Will continue to monitor.   Hematologic Disorders:    Anemia:  Anemia present with lowest hemoglobin of 7.7.  Will continue to monitor.     Thrombocytopenia:  Thrombocytopenia present with lowest platelet count of 124.  Will continue to monitor and assess for bleeding complications.               _____________________  Discharge Exam:  Admission Weight: 90.5 kg (199 lb 8.3 oz)  Discharge Weight: Weight: 88.3 kg (194 lb 10.7 oz) BMI: Body mass index is 26.4 kg/m. BP (!) 158/71 (BP Location: Left upper arm, Patient Position: Sitting, BP Cuff Size: Small Adult)   Pulse 77   Temp 36.7 C (98 F) (Oral)   Resp 18   Ht 182.9 cm (6')   Wt 88.3 kg (194 lb 10.7 oz)   SpO2 92%   BMI 26.40 kg/m   General: alert, cooperative, and in NAD Respiratory: regular rate, symmetric, unlabored, clear to auscultation bilaterally, and no accessory muscle use Cardiac: regular rate, regular rhythm, and RUSB holosystolic murmur Abdomen: normal  bowel sounds, soft, nontender, and  nondistended Extremities: extremities warm and well perfused, no clubbing or cyanosis, no edema, and distal pulses intact Lines: none  Pertinent Lab Testing:  BMP: Recent Labs  Lab 03/22/24 0620  NA 140  K 4.6  CL 106  CO2 24  BUN 36*  CREATININE 2.5*  GLUCOSE 174*  CALCIUM 10.0  MG 2.0   CBC: Recent Labs  Lab 03/22/24 0620  WBC 8.0  HGB 7.7*  HCT 25.2*  PLT 124*   INR: Recent Labs  Lab 03/16/24 1226 03/17/24 0655  INR 2.0* 1.6*    TFTs: No results for input(s): TSH, T4FREE in the last 168 hours. Troponin: No results for input(s): HSTNT, HSTNTINTERP, HSTNTSYMP, DELTAHSTNT in the last 168 hours. Pro-BNP: Recent Labs  Lab 03/17/24 0655  PROBNP 14,316*    Cholesterol: Recent Labs  Lab 03/17/24 0655  CHOLTOTAL 85  HDL 38  TRIG 49   Hgb A1c: No results for input(s): HGBA1C in the last 168 hours.     _____________________  Time spent on discharge process: >30 minutes  JONATHAN SAMUEL TAYLOR-FISHWICK, MD  ------------------------------------------------------------------------------- Attestation signed by [place], MD at 03/24/2024  8:27 AM  I agree with documentation provided by house staff. I have personally examined that patient.   Patient presented with anginal chest pain and shortness of breath. Given improved but ongoing chest pain he was taken to the cath lab which demonstrated ostial lesion of LIMA graft supplying the RCA with prior stenting. This area was ballooned and restented with good outcomes. Patient reported symptomatic improvement with this. - continue antianginal therapy: coreg 3.125mg  (sinus pause with higher doses), amlodipine 10mg  QD, imdur 120mg  QD - continue lipitor 80mg  QD (last LDL 34) - Triple therapy (ASA,plavix,eliquis) x1 week, then stop aspiring and continue dual therapy (plavix,eliquis) for at least 6 months - Of note, patient has a residual LM-ostial LAD lesion which may cause symptoms in the  future  His medication regimen was slightly altered during his hospitalization and discussed with him prior to discharge- empagliflozin 10mg  QD, valsartan 40mg  BID, torsemide 20mg  QD  I have spent 45 minutes this includes > 50% face to face in the patient room, reviewing the patient chart and care everywhere,  reassuring and counseling the patient, making discharge plan and formulating management plan with the patient.   -------------------------------------------------------------------------------

## 2024-03-27 DIAGNOSIS — D6489 Other specified anemias: Secondary | ICD-10-CM | POA: Insufficient documentation

## 2024-04-09 ENCOUNTER — Inpatient Hospital Stay

## 2024-04-09 ENCOUNTER — Inpatient Hospital Stay: Admitting: Nurse Practitioner

## 2024-04-14 ENCOUNTER — Inpatient Hospital Stay (HOSPITAL_BASED_OUTPATIENT_CLINIC_OR_DEPARTMENT_OTHER): Admitting: Nurse Practitioner

## 2024-04-14 ENCOUNTER — Other Ambulatory Visit: Payer: Self-pay

## 2024-04-14 ENCOUNTER — Encounter: Payer: Self-pay | Admitting: Nurse Practitioner

## 2024-04-14 ENCOUNTER — Inpatient Hospital Stay: Attending: Oncology

## 2024-04-14 VITALS — BP 135/63 | HR 63 | Temp 96.8°F | Resp 20 | Ht 72.5 in | Wt 194.0 lb

## 2024-04-14 DIAGNOSIS — N184 Chronic kidney disease, stage 4 (severe): Secondary | ICD-10-CM

## 2024-04-14 DIAGNOSIS — D631 Anemia in chronic kidney disease: Secondary | ICD-10-CM | POA: Insufficient documentation

## 2024-04-14 DIAGNOSIS — D649 Anemia, unspecified: Secondary | ICD-10-CM

## 2024-04-14 DIAGNOSIS — N189 Chronic kidney disease, unspecified: Secondary | ICD-10-CM | POA: Insufficient documentation

## 2024-04-14 DIAGNOSIS — I130 Hypertensive heart and chronic kidney disease with heart failure and stage 1 through stage 4 chronic kidney disease, or unspecified chronic kidney disease: Secondary | ICD-10-CM | POA: Diagnosis present

## 2024-04-14 LAB — CBC (CANCER CENTER ONLY)
HCT: 24.5 % — ABNORMAL LOW (ref 39.0–52.0)
Hemoglobin: 7.6 g/dL — ABNORMAL LOW (ref 13.0–17.0)
MCH: 31 pg (ref 26.0–34.0)
MCHC: 31 g/dL (ref 30.0–36.0)
MCV: 100 fL (ref 80.0–100.0)
Platelet Count: 179 K/uL (ref 150–400)
RBC: 2.45 MIL/uL — ABNORMAL LOW (ref 4.22–5.81)
RDW: 14.9 % (ref 11.5–15.5)
WBC Count: 7.4 K/uL (ref 4.0–10.5)
nRBC: 0 % (ref 0.0–0.2)

## 2024-04-14 LAB — IRON AND TIBC
Iron: 38 ug/dL — ABNORMAL LOW (ref 45–182)
Saturation Ratios: 18 % (ref 17.9–39.5)
TIBC: 216 ug/dL — ABNORMAL LOW (ref 250–450)
UIBC: 178 ug/dL

## 2024-04-14 LAB — FERRITIN: Ferritin: 903 ng/mL — ABNORMAL HIGH (ref 24–336)

## 2024-04-14 NOTE — Progress Notes (Signed)
 Hematology/Oncology Consult note [facility]  [phone] [phone]  Patient Care Team: Pcp, No as PCP - Diedre Sheldon Standing, MD as Consulting Physician (General Surgery) Janalyn Keene NOVAK, MD (Inactive) as Consulting Physician (Gastroenterology) Florencio Cara BIRCH, MD as Consulting Physician (Cardiology) Melanee Annah BROCKS, MD as Consulting Physician (Oncology)   Name of the patient: [name]  [id number]  June 20, 1946   Date of visit: 04/14/24  Diagnosis- anemia multifactorial secondary to chronic disease as well as iron  deficiency    Chief complaint/ Reason for visit-routine follow-up of anemia  Heme/Onc history:  Patient is a 78 year old male with history of multiple gastric polyps who was seen by both Dr. [name] and Dr. Janalyn.  He has undergone EGD with Dr. [name] and had a large gastric hyperplastic polyp removal.  Prior to that patient had significant iron  deficiency and his aspirin  and Plavix  were on hold.  These were restarted after EGD.  Most recent CBC on 11/01/2020 showed white count of 6.8, H&H of 11.3/36.4 with an MCV of 85 and a platelet count of 158.  Iron  studies were normal.  Patient denies any bleeding in his stool or dark melanotic stools.   Patient has been requiring IV iron  intermittently.  He has not required any EPO so far.  Interval history- [name] is a 78 y.o. male with above history of iron  deficiency anemia anemia of chronic kidney disease who returns to clinic for hospital follow-up.  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  Hemoglobin was 8.1.  Chest x-ray showed large right pleural effusion.  Troponin mildly elevated.  He was admitted to the hospital.  He suffered a syncopal episode and became unresponsive.  Received CPR and regained consciousness after about 2 minutes.  Underwent a blood  transfusion and thoracentesis.  He was discharged home then re-presented to ER at [facility] on 03/16/2024 with chest pain, shortness of breath, bilateral leg swelling. He was admitted, discharged on 8/10 then re-admitted on 03/26/24 after having worsening sob and worsening anemia. Hmg dropped from 7.7 to 6.6. Apixaban  held due to concern for bleeding.  Today, he says he feels somewhat stronger since discharge. Breathing is at baseline. Denies any neurologic complaints. Denies recent fevers or illnesses. Endorses easy bleeding/bruising which is chronic. No melena or hematochezia. No pica or restless leg. Reports good appetite and denies weight loss. Denies chest pain. Denies any nausea, vomiting, constipation, or diarrhea. Denies urinary complaints. Patient offers no further specific complaints today. Weight is down.   ECOG PS- 3 Pain scale- 0  Review of systems- Review of Systems  Constitutional:  Positive for malaise/fatigue and weight loss. Negative for chills and fever.  HENT:  Negative for nosebleeds.   Eyes:  Negative for blurred vision and double vision.  Respiratory:  Positive for shortness of breath. Negative for cough, hemoptysis and wheezing.   Cardiovascular:  Positive for leg swelling. Negative for chest pain and palpitations.  Gastrointestinal:  Negative for abdominal pain, blood in stool, constipation, diarrhea, melena, nausea and vomiting.  Genitourinary:  Negative for dysuria and urgency.  Musculoskeletal:  Negative for back pain, falls, joint pain and myalgias.  Skin:  Negative for itching and rash.  Neurological:  Negative for dizziness, tingling, sensory change, loss of consciousness, weakness and headaches.  Endo/Heme/Allergies:  Negative for environmental allergies. Bruises/bleeds easily.  Psychiatric/Behavioral:  Negative for depression. The patient is not nervous/anxious and does  not have insomnia.     Allergies  Allergen Reactions   Isosorbide  Cough   Past Medical History:   Diagnosis Date   (HFpEF) heart failure with preserved ejection fraction (HCC) 07/25/2016   a.)TTE 07/25/16: EF >55, triv-mild pan regur, mild AS, G2DD; b.)TTE 09/02/16: EF >55, triv TR, mild AS; c.)TTE 11/05/16: EF >55, LAE, triv-mild pan regur, mild AS, G2DD; d.)TTE 01/21/20: EF >55, mod LVH, LAE/RVE, triv-mild AR/PR/TR, mod MR, mild AS, G2DD; e.)TTE 09/24/22: EF>55, sev LVH, sev LAE, RAE, mild PR, mod AR/MR/TR, mild-mod AS, RVSP 74.7; f.)TTE 12/23/22: EF 60-65, LAE, mild-mod MR/TR, mild AS   Abnormal urine 05/01/2023   Anemia    Aortic atherosclerosis (HCC)    Aortic stenosis 07/25/2016   a.) TTE 07/25/2016: mild (MPG 13.5); b.) TTE 09/02/2016: mild (MPG 16); c.) TTE 11/05/2016: mild (MPG 9); d.) TTE 01/21/2020: mild (MPG 11.7); e.) TTE 09/24/2022: mild-mod (MPG 20.3); f.) TTE 12/23/2022: mild (MPG 15)   Atrial fibrillation (HCC)    a.) CHA2DS2VASc = 6 (age x2, CHF, HTN, vascular disease history, T2DM);  b.) rate/rhythm maintained on oral labetalol ; chronically anticoagulated with apixaban  + clopidogrel    AV block, 1st degree    B12 deficiency    CKD (chronic kidney disease), stage III (HCC)    Colon polyps    Congestive heart failure (HCC) 05/01/2023   Coronary artery disease 08/16/2016   a.) s/p 3v CABG 08/30/2016; b.) s/p PCI 11/26/2016 (DES x 1 oSVG-RCA); c.) s/p PCI 12/17/2016 (DES x 5 --> mLCx x2, dLCx, pLAD, dLAD); c.) s/p PCI 06/09/2020 (DES x1 --> ISR oSVG-PDA)   Cyst of kidney, acquired 07/03/2023   Dyspnea    Gastroesophageal reflux disease 05/01/2023   GERD (gastroesophageal reflux disease)    Heart murmur    Hepatic steatosis    History of 2019 novel coronavirus disease (COVID-19) 09/02/2020   a.) Tx'd with remdesivir    History of blood transfusion    History of GI bleed 09/02/2020   a.) admitted to [facility] 09/02/2020 - 09/08/2020   History of heart artery stent 11/26/2016   TOTAL of 7 stents: a.) 11/26/2016 --> 3.5 x 22 mm Resolute Integrity oSVG-RCA; b.) 12/17/2016: 3.0 x 12  mm Xience Alpine dLCx, 3.5 x 22 mm Resolute Onyx mLCx, 3.5 x 18 mm Resolute Onyx mLCx, 3.0 x 26 mm Resolute Onyx pLAD, 2.0 x 26 mm Resolute Onyx dLAD; c.) 06/09/2020 --> 2.5 x 18 mm Resolute Onyx ISR oSVG-PDA   History of partial colectomy 08/12/2012   a.) large gastric hyperplastic polyp removal   HLD (hyperlipidemia)    Hypertension    LBBB (left bundle branch block)    Long term current use of anticoagulant    a.) apixaban    Long term current use of antithrombotics/antiplatelets    a.) clopidogrel    Male hypogonadism    a.) on exogenous TRT (1.62% gel)   Mild cardiomegaly    Mild gynecomastia (bilateral)    Nephrolithiasis    OSA (obstructive sleep apnea)    a.) does not utilize nocturnal PAP therapy   Personal history of surgery to heart and great vessels, presenting hazards to health 05/01/2023   Proteinuria, unspecified 08/01/2023   Pulmonary hypertension (HCC) 09/24/2022   a.) TTE 09/24/2022: RVSP 74.7; b.) TTE 12/23/2022: RVSP 31.3   RBBB (right bundle branch block with left anterior fascicular block)    Right inguinal hernia    Right thyroid  nodule    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   a.) LIMA-LAD, SVG-PDA, SVG-OM3  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/01/2023   Type 2 diabetes mellitus treated with insulin  (HCC)    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   Umbilical hernia    Unstable angina (HCC)    Vitamin D  deficiency    Past Surgical History:  Procedure Laterality Date   APPENDECTOMY     CARDIAC CATHETERIZATION Left 08/16/2016   Procedure: Left Heart Cath and Coronary Angiography;  Surgeon: Cara JONETTA Lovelace, MD;  Location: [facility] INVASIVE CV LAB;  Service: Cardiovascular;  Laterality: Left;   COLONOSCOPY N/A 09/07/2020   Procedure: COLONOSCOPY;  Surgeon: Janalyn Keene NOVAK, MD;  Location: [facility] ENDOSCOPY;  Service: Endoscopy;  Laterality: N/A;   COLONOSCOPY N/A 10/19/2023   Procedure: COLONOSCOPY;  Surgeon: Jinny Carmine, MD;   Location: [facility] ENDOSCOPY;  Service: Endoscopy;  Laterality: N/A;   COLONOSCOPY WITH PROPOFOL  N/A 05/17/2021   Procedure: COLONOSCOPY WITH PROPOFOL ;  Surgeon: Janalyn Keene NOVAK, MD;  Location: [facility] ENDOSCOPY;  Service: Endoscopy;  Laterality: N/A;   CORONARY ANGIOPLASTY WITH STENT PLACEMENT Left 11/26/2016   Procedure: CORONARY ANGIOPLASTY WITH STENT PLACEMENT; Location: [facility]; Surgeon: Alm Dais, MD   CORONARY ARTERY BYPASS GRAFT N/A 08/30/2016   Procedure: CORONARY ARTERY BYPASS GRAFT; Location: [facility]; Surgeon: Juliene Pouch, MD   CORONARY STENT INTERVENTION N/A 06/09/2020   Procedure: CORONARY STENT INTERVENTION;  Surgeon: Lovelace Cara JONETTA, MD;  Location: [facility] CV LAB;  Service: Cardiovascular;  Laterality: N/A;   CORONARY STENT INTERVENTION Left 12/17/2016   Procedure: CORONARY STENT INTERVENTION; Location: [facility]; Surgeon: Alm Dais, MD   ENDOSCOPIC MUCOSAL RESECTION N/A 09/22/2020   Procedure: ENDOSCOPIC MUCOSAL RESECTION;  Surgeon: [name] Aloha Raddle., MD;  Location: [facility] ENDOSCOPY;  Service: Gastroenterology;  Laterality: N/A;   ESOPHAGOGASTRODUODENOSCOPY N/A 09/07/2020   Procedure: ESOPHAGOGASTRODUODENOSCOPY (EGD);  Surgeon: Janalyn Keene NOVAK, MD;  Location: [facility] ENDOSCOPY;  Service: Endoscopy;  Laterality: N/A;   ESOPHAGOGASTRODUODENOSCOPY N/A 10/19/2023   Procedure: EGD (ESOPHAGOGASTRODUODENOSCOPY);  Surgeon: Jinny Carmine, MD;  Location: [facility] ENDOSCOPY;  Service: Endoscopy;  Laterality: N/A;   ESOPHAGOGASTRODUODENOSCOPY (EGD) WITH PROPOFOL  N/A 09/22/2020   Procedure: ESOPHAGOGASTRODUODENOSCOPY (EGD) WITH PROPOFOL ;  Surgeon: [name] Aloha Raddle., MD;  Location: [facility] ENDOSCOPY;  Service: Gastroenterology;  Laterality: N/A;   ESOPHAGOGASTRODUODENOSCOPY (EGD) WITH PROPOFOL  N/A 08/26/2023   Procedure: ESOPHAGOGASTRODUODENOSCOPY (EGD) WITH PROPOFOL ;  Surgeon: Jinny Carmine, MD;  Location: [facility] ENDOSCOPY;  Service: Endoscopy;  Laterality: N/A;   FRACTURE SURGERY     HEMOSTASIS  CLIP PLACEMENT  09/22/2020   Procedure: HEMOSTASIS CLIP PLACEMENT;  Surgeon: [name] Aloha Raddle., MD;  Location: [facility] ENDOSCOPY;  Service: Gastroenterology;;   HEMOSTASIS CONTROL  09/22/2020   Procedure: HEMOSTASIS CONTROL;  Surgeon: [name] Aloha Raddle., MD;  Location: [facility] ENDOSCOPY;  Service: Gastroenterology;;   INSERTION OF MESH  02/19/2023   Procedure: INSERTION OF MESH;  Surgeon: Desiderio Schanz, MD;  Location: [facility] ORS;  Service: General;;  umbilical   LAPAROSCOPIC PARTIAL RIGHT COLECTOMY Right 08/12/2012   Procedure: LAPAROSCOPIC PARTIAL RIGHT COLECTOMY; Location: [facility]; Surgeon: Unknown Sharps, MD   LEFT HEART CATH AND CORONARY ANGIOGRAPHY Left 06/09/2020   Procedure: LEFT HEART CATH AND CORONARY ANGIOGRAPHY;  Surgeon: Lovelace Cara JONETTA, MD;  Location: [facility] CV LAB;  Service: Cardiovascular;  Laterality: Left;   LEFT HEART CATH AND CORS/GRAFTS ANGIOGRAPHY Left 11/20/2016   Procedure: Left Heart Cath and Cors/Grafts Angiography;  Surgeon: Cara JONETTA Lovelace, MD;  Location: [facility] CV LAB;  Service: Cardiovascular;  Laterality: Left;   POLYPECTOMY  09/22/2020   Procedure: POLYPECTOMY;  Surgeon: Mansouraty,  Aloha Raddle., MD;  Location: [facility] ENDOSCOPY;  Service: Gastroenterology;;   POLYPECTOMY  10/19/2023   Procedure: POLYPECTOMY;  Surgeon: Jinny Carmine, MD;  Location: [facility] ENDOSCOPY;  Service: Endoscopy;;   SUBMUCOSAL LIFTING INJECTION  09/22/2020   Procedure: SUBMUCOSAL LIFTING INJECTION;  Surgeon: [name] Aloha Raddle., MD;  Location: [facility] ENDOSCOPY;  Service: Gastroenterology;;   UMBILICAL HERNIA REPAIR N/A 02/19/2023   Procedure: HERNIA REPAIR UMBILICAL ADULT, open;  Surgeon: Desiderio Schanz, MD;  Location: [facility] ORS;  Service: General;  Laterality: N/A;   XI ROBOTIC ASSISTED INGUINAL HERNIA REPAIR WITH MESH Right 02/19/2023   Procedure: XI ROBOTIC ASSISTED INGUINAL HERNIA REPAIR WITH MESH;  Surgeon: Desiderio Schanz, MD;  Location: [facility] ORS;  Service: General;  Laterality: Right;    Social History   Socioeconomic History   Marital status: Married    Spouse name: Not on file   Number of children: Not on file   Years of education: Not on file   Highest education level: Not on file  Occupational History   Not on file  Tobacco Use   Smoking status: Former    Current packs/day: 0.00    Types: Cigarettes    Quit date: 2000    Years since quitting: 25.6    Passive exposure: Never   Smokeless tobacco: Never  Vaping Use   Vaping status: Never Used  Substance and Sexual Activity   Alcohol use: No   Drug use: No   Sexual activity: Not Currently  Other Topics Concern   Not on file  Social History Narrative   Not on file   Social Drivers of Health   Financial Resource Strain: Low Risk  (03/28/2024)   Received from [facility]   Overall Financial Resource Strain (CARDIA)    Difficulty of Paying Living Expenses: Not hard at all  Food Insecurity: No Food Insecurity (03/28/2024)   Received from [facility]   Hunger Vital Sign    Within the past 12 months, you worried that your food would run out before you got the money to buy more.: Never true    Within the past 12 months, the food you bought just didn't last and you didn't have money to get more.: Never true  Transportation Needs: No Transportation Needs (03/28/2024)   Received from [facility] - Transportation    In the past 12 months, has lack of transportation kept you from medical appointments or from getting medications?: No    Lack of Transportation (Non-Medical): No  Physical Activity: Not on file  Stress: Not on file  Social Connections: Patient Declined (03/02/2024)   Social Connection and Isolation Panel    Frequency of Communication with Friends and Family: Patient declined    Frequency of Social Gatherings with Friends and Family: Patient declined    Attends Religious Services: Patient declined    Database administrator or  Organizations: Patient declined    Attends Banker Meetings: Patient declined    Marital Status: Patient declined  Intimate Partner Violence: Not At Risk (03/02/2024)   Humiliation, Afraid, Rape, and Kick questionnaire    Fear of Current or Ex-Partner: No    Emotionally Abused: No    Physically Abused: No    Sexually Abused: No   Family History  Problem Relation Age of Onset   Hyperlipidemia Mother    Heart disease Mother    Hypertension Father     Current Outpatient Medications:    acetaminophen  (TYLENOL ) 325  MG tablet, Take 2 tablets (650 mg total) by mouth every 6 (six) hours as needed for mild pain (pain score 1-3), fever or headache (or Fever >/= 101)., Disp: , Rfl:    amLODipine  (NORVASC ) 5 MG tablet, Take 5 mg by mouth 2 (two) times daily., Disp: , Rfl:    aspirin  EC 81 MG tablet, Take 81 mg by mouth daily., Disp: , Rfl:    atorvastatin  (LIPITOR ) 80 MG tablet, Take 80 mg by mouth.  Take 80 mg by mouth in the morning., Disp: , Rfl:    carvedilol  (COREG ) 3.125 MG tablet, Take 1 tablet (3.125 mg total) by mouth 2 (two) times daily with a meal., Disp: 60 tablet, Rfl: 1   clopidogrel  (PLAVIX ) 75 MG tablet, Take 75 mg by mouth daily., Disp: , Rfl:    cyanocobalamin  (VITAMIN B12) 1000 MCG tablet, Take 1,000 mcg by mouth daily., Disp: , Rfl:    dexlansoprazole (DEXILANT) 60 MG capsule, Take 1 capsule by mouth daily., Disp: , Rfl:    empagliflozin  (JARDIANCE ) 10 MG TABS tablet, Take 1 tablet (10 mg total) by mouth daily before breakfast., Disp: 30 tablet, Rfl: 0   Ferrous Sulfate  (IRON ) 325 (65 Fe) MG TABS, Take 1 tablet by mouth daily., Disp: , Rfl:    isosorbide  mononitrate (IMDUR ) 120 MG 24 hr tablet, Take 120 mg by mouth daily., Disp: , Rfl:    lactulose  (CHRONULAC ) 10 GM/15ML solution, Taking 15-20 cc by mouth 3 to 4 times a day for constiption, Disp: 946 mL, Rfl: 2   lactulose , encephalopathy, (CHRONULAC ) 10 GM/15ML SOLN, Take 12.5 mLs by mouth daily as needed  (constipation)., Disp: , Rfl:    nitroGLYCERIN  (NITROSTAT ) 0.4 MG SL tablet, Place 0.4 mg under the tongue every 5 (five) minutes as needed., Disp: , Rfl:    Testosterone  1.62 % GEL, Apply 2 Pump topically daily. One pump on each arm, Disp: 225 g, Rfl: 3   torsemide  (DEMADEX ) 20 MG tablet, Take 2 tablets (40 mg total) by mouth daily. (Patient taking differently: Take 20 mg by mouth daily. Patient may take an second dose in afternoon if he gains water weight with more than 2-3 pounds over 1-2 days), Disp: 60 tablet, Rfl: 0   apixaban  (ELIQUIS ) 5 MG TABS tablet, Take 5 mg by mouth.  Take 5 mg by mouth in the morning and 5 mg in the evening. (Patient not taking: Reported on 04/14/2024), Disp: , Rfl:    benzonatate  (TESSALON ) 100 MG capsule, Take 100 mg by mouth., Disp: , Rfl:    [Paused] hydrALAZINE  (APRESOLINE ) 50 MG tablet, Take 1 tablet by mouth 3 (three) times daily. (Patient not taking: Reported on 04/14/2024), Disp: , Rfl:    [Paused] spironolactone  (ALDACTONE ) 25 MG tablet, Take 25 mg by mouth in the morning., Disp: , Rfl:   Physical exam:  Vitals:   04/14/24 1427 04/14/24 1430 04/14/24 1437  BP:  135/63   Pulse:  63   Resp:  20   Temp:  (!) 96.8 F (36 C)   TempSrc:  Tympanic   SpO2:  93% 95%  Height: 6' 0.5 (1.842 m)     Physical Exam Vitals reviewed.  Constitutional:      Appearance: He is not ill-appearing.  Cardiovascular:     Rate and Rhythm: Normal rate and regular rhythm.     Heart sounds: Murmur heard.  Pulmonary:     Effort: Pulmonary effort is normal. No respiratory distress.  Abdominal:     General: There is  no distension.     Palpations: Abdomen is soft.     Tenderness: There is no abdominal tenderness.  Musculoskeletal:     Right lower leg: Edema present.     Left lower leg: Edema present.  Skin:    General: Skin is warm and dry.     Coloration: Skin is pale.  Neurological:     Mental Status: He is alert and oriented to person, place, and time.  Psychiatric:         Mood and Affect: Mood normal.        Behavior: Behavior normal.     I have personally reviewed labs listed below:    Latest Ref Rng & Units 03/04/2024    3:42 AM  CMP  Glucose 70 - 99 mg/dL 852   BUN 8 - 23 mg/dL 51   Creatinine 9.38 - 1.24 mg/dL 6.94   Sodium 864 - 854 mmol/L 138   Potassium 3.5 - 5.1 mmol/L 5.2   Chloride 98 - 111 mmol/L 105   CO2 22 - 32 mmol/L 23   Calcium  8.9 - 10.3 mg/dL 9.8       Latest Ref Rng & Units 04/14/2024    1:49 PM  CBC  WBC 4.0 - 10.5 K/uL 7.4   Hemoglobin 13.0 - 17.0 g/dL 7.6   Hematocrit 60.9 - 52.0 % 24.5   Platelets 150 - 400 K/uL 179    Iron /TIBC/Ferritin/ %Sat    Component Value Date/Time   IRON  80 03/02/2024 1314   IRON  54 11/01/2020 1505   TIBC 272 03/02/2024 1314   TIBC 341 11/01/2020 1505   FERRITIN 311 03/02/2024 1314   IRONPCTSAT 30 03/02/2024 1314   IRONPCTSAT 16 11/01/2020 1505    Assessment and plan- Patient is a 78 y.o. 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 Hold IV iron . Hmg 7.6 today. Since his hemoglobin is persistently < 10, with optimized iron  stores, recommend starting ESA. Today we discussed how CKD impacts hemoglobin/anemia. Also discussed risks including cardiovascular events such as MI, Stroke, VTE, thrombosis, hypertension. In trials, patients had greater risk of AEs when ESAs targeted a hemoglobin > 11, therefore, our target hemoglobin is < 11. We will plan to use lowest darbepoetin alfa dose sufficient to reduce need for transfusions. ESAs contraindicated when patient has active malignancy. Patient verbalizes understanding and agrees to proceed. Start aranesp subcutaneous 40 mcg every 2-4 weeks.  Iron  Deficiency- iron  stores pending today. Eliquis  stopped during recent hospitalization. He was referred to GI for capsule study.  Stage IV CKD- GFR 23 during hospitalization.  Heart Failure/CAD- hx  of CABG x 3, pleural effusions and BLE edema. S/p hospitalization at [facility] with TAVR. Awaiting evaluation with cardiac electrophysiology for consideration of Watchman device.   Disposition:  Next week- lab (H&H), +/- aranesp Every 2 weeks- lab (H&H), +/- aranesp Keep f/u with Dr Melanee as scheduled- la   Visit Diagnosis 1. Anemia in stage 4 chronic kidney disease (HCC)     Tinnie Dawn, DNP, AGNP-C, AOCNP [facility] [phone] (clinic) 04/14/2024  CC: Dr Melanee

## 2024-04-24 ENCOUNTER — Inpatient Hospital Stay

## 2024-04-24 ENCOUNTER — Other Ambulatory Visit: Payer: Self-pay

## 2024-04-24 VITALS — BP 157/76

## 2024-04-24 DIAGNOSIS — I130 Hypertensive heart and chronic kidney disease with heart failure and stage 1 through stage 4 chronic kidney disease, or unspecified chronic kidney disease: Secondary | ICD-10-CM | POA: Diagnosis not present

## 2024-04-24 DIAGNOSIS — D631 Anemia in chronic kidney disease: Secondary | ICD-10-CM

## 2024-04-24 DIAGNOSIS — D508 Other iron deficiency anemias: Secondary | ICD-10-CM

## 2024-04-24 LAB — HEMOGLOBIN AND HEMATOCRIT (CANCER CENTER ONLY)
HCT: 23.5 % — ABNORMAL LOW (ref 39.0–52.0)
Hemoglobin: 7.3 g/dL — ABNORMAL LOW (ref 13.0–17.0)

## 2024-04-24 LAB — PREPARE RBC (CROSSMATCH)

## 2024-04-24 MED ORDER — DARBEPOETIN ALFA 40 MCG/0.4ML IJ SOSY
40.0000 ug | PREFILLED_SYRINGE | Freq: Once | INTRAMUSCULAR | Status: AC
  Administered 2024-04-24: 40 ug via SUBCUTANEOUS
  Filled 2024-04-24: qty 0.4

## 2024-04-24 NOTE — Progress Notes (Signed)
 Hgb 7.3 today.  Patient presents symptoms of increased fatigue/weakness and would like a blood transfusion if available.   Discussed with Dr. Melanee and she approves transfusion on Monday.  Patient scheduled

## 2024-04-27 ENCOUNTER — Encounter

## 2024-04-27 ENCOUNTER — Inpatient Hospital Stay

## 2024-04-27 DIAGNOSIS — D631 Anemia in chronic kidney disease: Secondary | ICD-10-CM

## 2024-04-27 DIAGNOSIS — I130 Hypertensive heart and chronic kidney disease with heart failure and stage 1 through stage 4 chronic kidney disease, or unspecified chronic kidney disease: Secondary | ICD-10-CM | POA: Diagnosis not present

## 2024-04-27 MED ORDER — ACETAMINOPHEN 325 MG PO TABS
650.0000 mg | ORAL_TABLET | Freq: Once | ORAL | Status: AC
  Administered 2024-04-27: 650 mg via ORAL
  Filled 2024-04-27: qty 2

## 2024-04-27 MED ORDER — DIPHENHYDRAMINE HCL 25 MG PO CAPS
25.0000 mg | ORAL_CAPSULE | Freq: Once | ORAL | Status: AC
  Administered 2024-04-27: 25 mg via ORAL
  Filled 2024-04-27: qty 1

## 2024-04-27 MED ORDER — SODIUM CHLORIDE 0.9% IV SOLUTION
250.0000 mL | INTRAVENOUS | Status: DC
  Administered 2024-04-27: 250 mL via INTRAVENOUS
  Filled 2024-04-27: qty 250

## 2024-04-27 NOTE — Patient Instructions (Signed)

## 2024-04-28 LAB — TYPE AND SCREEN
ABO/RH(D): A POS
Antibody Screen: NEGATIVE
Unit division: 0

## 2024-04-28 LAB — BPAM RBC
Blood Product Expiration Date: 202510122359
ISSUE DATE / TIME: 202509151330
Unit Type and Rh: 6200

## 2024-05-08 ENCOUNTER — Inpatient Hospital Stay

## 2024-05-08 ENCOUNTER — Ambulatory Visit

## 2024-05-08 ENCOUNTER — Other Ambulatory Visit

## 2024-05-08 VITALS — BP 146/56 | HR 72

## 2024-05-08 DIAGNOSIS — D508 Other iron deficiency anemias: Secondary | ICD-10-CM

## 2024-05-08 DIAGNOSIS — D631 Anemia in chronic kidney disease: Secondary | ICD-10-CM

## 2024-05-08 DIAGNOSIS — D649 Anemia, unspecified: Secondary | ICD-10-CM

## 2024-05-08 DIAGNOSIS — I130 Hypertensive heart and chronic kidney disease with heart failure and stage 1 through stage 4 chronic kidney disease, or unspecified chronic kidney disease: Secondary | ICD-10-CM | POA: Diagnosis not present

## 2024-05-08 LAB — CBC WITH DIFFERENTIAL (CANCER CENTER ONLY)
Abs Immature Granulocytes: 0.03 K/uL (ref 0.00–0.07)
Basophils Absolute: 0 K/uL (ref 0.0–0.1)
Basophils Relative: 0 %
Eosinophils Absolute: 0.1 K/uL (ref 0.0–0.5)
Eosinophils Relative: 1 %
HCT: 25.4 % — ABNORMAL LOW (ref 39.0–52.0)
Hemoglobin: 8 g/dL — ABNORMAL LOW (ref 13.0–17.0)
Immature Granulocytes: 0 %
Lymphocytes Relative: 6 %
Lymphs Abs: 0.6 K/uL — ABNORMAL LOW (ref 0.7–4.0)
MCH: 30.2 pg (ref 26.0–34.0)
MCHC: 31.5 g/dL (ref 30.0–36.0)
MCV: 95.8 fL (ref 80.0–100.0)
Monocytes Absolute: 0.8 K/uL (ref 0.1–1.0)
Monocytes Relative: 9 %
Neutro Abs: 7.1 K/uL (ref 1.7–7.7)
Neutrophils Relative %: 84 %
Platelet Count: 179 K/uL (ref 150–400)
RBC: 2.65 MIL/uL — ABNORMAL LOW (ref 4.22–5.81)
RDW: 15.9 % — ABNORMAL HIGH (ref 11.5–15.5)
WBC Count: 8.5 K/uL (ref 4.0–10.5)
nRBC: 0 % (ref 0.0–0.2)

## 2024-05-08 LAB — IRON AND TIBC
Iron: 40 ug/dL — ABNORMAL LOW (ref 45–182)
Saturation Ratios: 16 % — ABNORMAL LOW (ref 17.9–39.5)
TIBC: 248 ug/dL — ABNORMAL LOW (ref 250–450)
UIBC: 208 ug/dL

## 2024-05-08 LAB — FERRITIN: Ferritin: 697 ng/mL — ABNORMAL HIGH (ref 24–336)

## 2024-05-08 MED ORDER — DARBEPOETIN ALFA 40 MCG/0.4ML IJ SOSY
40.0000 ug | PREFILLED_SYRINGE | Freq: Once | INTRAMUSCULAR | Status: AC
  Administered 2024-05-08: 40 ug via SUBCUTANEOUS
  Filled 2024-05-08: qty 0.4

## 2024-05-22 ENCOUNTER — Inpatient Hospital Stay: Attending: Oncology

## 2024-05-22 ENCOUNTER — Other Ambulatory Visit: Payer: Self-pay | Admitting: Oncology

## 2024-05-22 ENCOUNTER — Inpatient Hospital Stay

## 2024-05-22 ENCOUNTER — Other Ambulatory Visit: Payer: Self-pay

## 2024-05-22 ENCOUNTER — Telehealth: Payer: Self-pay

## 2024-05-22 VITALS — BP 132/62 | HR 70 | Resp 93

## 2024-05-22 DIAGNOSIS — D508 Other iron deficiency anemias: Secondary | ICD-10-CM

## 2024-05-22 DIAGNOSIS — D631 Anemia in chronic kidney disease: Secondary | ICD-10-CM

## 2024-05-22 DIAGNOSIS — D649 Anemia, unspecified: Secondary | ICD-10-CM

## 2024-05-22 DIAGNOSIS — N184 Chronic kidney disease, stage 4 (severe): Secondary | ICD-10-CM | POA: Insufficient documentation

## 2024-05-22 DIAGNOSIS — I130 Hypertensive heart and chronic kidney disease with heart failure and stage 1 through stage 4 chronic kidney disease, or unspecified chronic kidney disease: Secondary | ICD-10-CM | POA: Diagnosis present

## 2024-05-22 LAB — HEMOGLOBIN AND HEMATOCRIT (CANCER CENTER ONLY)
HCT: 23.8 % — ABNORMAL LOW (ref 39.0–52.0)
Hemoglobin: 7.3 g/dL — ABNORMAL LOW (ref 13.0–17.0)

## 2024-05-22 LAB — PREPARE RBC (CROSSMATCH)

## 2024-05-22 MED ORDER — DARBEPOETIN ALFA 100 MCG/0.5ML IJ SOSY
60.0000 ug | PREFILLED_SYRINGE | INTRAMUSCULAR | Status: DC
  Administered 2024-05-22: 60 ug via SUBCUTANEOUS
  Filled 2024-05-22: qty 0.5

## 2024-05-22 NOTE — Telephone Encounter (Signed)
 Patient here for lab/Aranesp  inj today.  Hgb 7.3 with symptoms of dyspnea on exertion and increasing fatigue.    Dr. Melanee would like to increase Aranesp  dose today and pt scheduled for blood transfusion on Monday.

## 2024-05-25 ENCOUNTER — Inpatient Hospital Stay

## 2024-05-25 DIAGNOSIS — I130 Hypertensive heart and chronic kidney disease with heart failure and stage 1 through stage 4 chronic kidney disease, or unspecified chronic kidney disease: Secondary | ICD-10-CM | POA: Diagnosis not present

## 2024-05-25 DIAGNOSIS — D649 Anemia, unspecified: Secondary | ICD-10-CM

## 2024-05-25 MED ORDER — ACETAMINOPHEN 325 MG PO TABS
650.0000 mg | ORAL_TABLET | Freq: Once | ORAL | Status: AC
  Administered 2024-05-25: 650 mg via ORAL
  Filled 2024-05-25: qty 2

## 2024-05-25 MED ORDER — DIPHENHYDRAMINE HCL 25 MG PO CAPS
25.0000 mg | ORAL_CAPSULE | Freq: Once | ORAL | Status: AC
  Administered 2024-05-25: 25 mg via ORAL
  Filled 2024-05-25: qty 1

## 2024-05-25 MED ORDER — SODIUM CHLORIDE 0.9% IV SOLUTION
250.0000 mL | INTRAVENOUS | Status: DC
  Administered 2024-05-25: 100 mL via INTRAVENOUS
  Filled 2024-05-25: qty 250

## 2024-05-26 LAB — TYPE AND SCREEN
ABO/RH(D): A POS
Antibody Screen: NEGATIVE
Unit division: 0

## 2024-05-26 LAB — BPAM RBC
Blood Product Expiration Date: 202511092359
ISSUE DATE / TIME: 202510131305
Unit Type and Rh: 6200

## 2024-06-05 ENCOUNTER — Telehealth: Payer: Self-pay

## 2024-06-05 ENCOUNTER — Other Ambulatory Visit: Payer: Self-pay | Admitting: Oncology

## 2024-06-05 ENCOUNTER — Other Ambulatory Visit: Payer: Self-pay

## 2024-06-05 ENCOUNTER — Inpatient Hospital Stay

## 2024-06-05 VITALS — BP 160/65 | HR 73

## 2024-06-05 DIAGNOSIS — D631 Anemia in chronic kidney disease: Secondary | ICD-10-CM

## 2024-06-05 DIAGNOSIS — I130 Hypertensive heart and chronic kidney disease with heart failure and stage 1 through stage 4 chronic kidney disease, or unspecified chronic kidney disease: Secondary | ICD-10-CM | POA: Diagnosis not present

## 2024-06-05 DIAGNOSIS — D649 Anemia, unspecified: Secondary | ICD-10-CM

## 2024-06-05 DIAGNOSIS — D508 Other iron deficiency anemias: Secondary | ICD-10-CM

## 2024-06-05 LAB — HEMOGLOBIN AND HEMATOCRIT (CANCER CENTER ONLY)
HCT: 23 % — ABNORMAL LOW (ref 39.0–52.0)
Hemoglobin: 7.1 g/dL — ABNORMAL LOW (ref 13.0–17.0)

## 2024-06-05 LAB — PREPARE RBC (CROSSMATCH)

## 2024-06-05 MED ORDER — DARBEPOETIN ALFA 40 MCG/0.4ML IJ SOSY
60.0000 ug | PREFILLED_SYRINGE | INTRAMUSCULAR | Status: DC
  Administered 2024-06-05: 60 ug via SUBCUTANEOUS
  Filled 2024-06-05: qty 0.6

## 2024-06-05 NOTE — Telephone Encounter (Signed)
 Patient here for lab/Aranesp  today.  Hgb 7.1 today which is a drop from 7.3 two weeks ago despite increased Aranesp  dose to 60 mcg and blood transfusion.  Discussed with Dr. Melanee, patient scheduled for 2 units blood transfusion on Monday.  Also changed the 10/28 MD visit to same day as transfusion.  MD wants to hold off on drawing labs until after visit.

## 2024-06-05 NOTE — Telephone Encounter (Signed)
 Left messages on both [name] and [name]'s phones indicating change in appointment time

## 2024-06-08 ENCOUNTER — Inpatient Hospital Stay

## 2024-06-08 ENCOUNTER — Other Ambulatory Visit: Payer: Self-pay

## 2024-06-08 ENCOUNTER — Telehealth: Payer: Self-pay

## 2024-06-08 ENCOUNTER — Inpatient Hospital Stay: Admitting: Oncology

## 2024-06-08 VITALS — BP 127/47 | HR 68 | Temp 97.6°F | Resp 16 | Ht 72.5 in | Wt 188.6 lb

## 2024-06-08 DIAGNOSIS — D631 Anemia in chronic kidney disease: Secondary | ICD-10-CM | POA: Diagnosis not present

## 2024-06-08 DIAGNOSIS — D649 Anemia, unspecified: Secondary | ICD-10-CM

## 2024-06-08 DIAGNOSIS — N184 Chronic kidney disease, stage 4 (severe): Secondary | ICD-10-CM

## 2024-06-08 DIAGNOSIS — I130 Hypertensive heart and chronic kidney disease with heart failure and stage 1 through stage 4 chronic kidney disease, or unspecified chronic kidney disease: Secondary | ICD-10-CM | POA: Diagnosis not present

## 2024-06-08 MED ORDER — ACETAMINOPHEN 325 MG PO TABS
650.0000 mg | ORAL_TABLET | Freq: Once | ORAL | Status: AC
  Administered 2024-06-08: 650 mg via ORAL
  Filled 2024-06-08: qty 2

## 2024-06-08 MED ORDER — SODIUM CHLORIDE 0.9% IV SOLUTION
250.0000 mL | INTRAVENOUS | Status: DC
  Administered 2024-06-08: 100 mL via INTRAVENOUS
  Filled 2024-06-08: qty 250

## 2024-06-08 NOTE — Telephone Encounter (Signed)
 Outbound call to spouse's phone, patient answered phone informed of below.  Patient agreed to bm bx next Monday 11/3 appointment time 9:30am, [facility], NPO after midnight and will need a driver.  Also reminded Clarita would f/u on Friday 10/31 to review instructions again.  Patient verbalized understanding. Secure chat also sent to [facility] indicating patient has previously undergone an aortic valve replacement.

## 2024-06-08 NOTE — Progress Notes (Signed)
 Because of the fluid he's up and down during the night having to use restroom.  Because of the fluid he's up and down during the night having to use restroom.  Feels shortness of breath and pain when his blood starts dropping; post infusion he feels better.  Eating less due to change in taste; lost 6 pounds since last visit.  New meds: Dulcolax and Lokelma . Patient mentioned he is no longer seeing Dr. Florencio at [facility] but now Dr. Celestine.

## 2024-06-08 NOTE — Progress Notes (Signed)
 Hematology/Oncology Consult note [facility]  [phone] [phone]  Patient Care Team: Pcp, No as PCP - Diedre Sheldon Standing, MD as Consulting Physician (General Surgery) Janalyn Keene NOVAK, MD (Inactive) as Consulting Physician (Gastroenterology) Florencio Cara BIRCH, MD as Consulting Physician (Cardiology) Melanee Annah BROCKS, MD as Consulting Physician (Oncology)   Name of the patient: [name]  [id number]  1946-01-07   Date of visit: 06/08/24  Diagnosis-anemia of chronic kidney disease  Chief complaint/ Reason for visit-routine follow-up of anemia of chronic kidney disease  Heme/Onc history: Patient is a 78 year old male with history of multiple gastric polyps who was seen by both Dr. [name] and Dr. Janalyn.  He has undergone EGD with Dr. [name] and had a large gastric hyperplastic polyp removal.  Prior to that patient had significant iron  deficiency and his aspirin  and Plavix  were on hold.  These were restarted after EGD.  Most recent CBC on 11/01/2020 showed white count of 6.8, H&H of 11.3/36.4 with an MCV of 85 and a platelet count of 158.  Iron  studies were normal.  Patient denies any bleeding in his stool or dark melanotic stools.   Patient has been requiring IV iron  intermittently.   B12 levels have been normal in the past.  He is presently on EPO for anemia of chronic kidney disease since September 2025  Interval history- Discussed the use of AI scribe software for clinical note transcription with the patient, who gave verbal consent to proceed.  History of Present Illness   [name] is a 78 year old male with anemia of kidney disease who presents for follow-up after a blood transfusion.  He has been receiving treatment for anemia and has a history of kidney disease. Despite receiving erythropoiesis-stimulating agent (ESA) injections over the past few months, his hemoglobin levels have not improved and remain  around 7 g/dL. He requires blood transfusions intermittently to manage his symptoms.  After receiving a blood transfusion, he feels better for a couple of days, but his energy levels decline shortly thereafter. He experiences chest discomfort and difficulty breathing as his hemoglobin levels drop. His energy 'starts going down' three to four days after a transfusion, impacting his ability to function, particularly over weekends when he has to wait for medical attention.  He is currently receiving ESA injections every two weeks and has expressed concern about the timing of his blood work and transfusions, particularly over weekends, as he finds it difficult to manage his symptoms during this time.     ECOG PS- 3 Pain scale- 0  Review of systems- Review of Systems  Constitutional:  Positive for malaise/fatigue. Negative for chills, fever and weight loss.  HENT:  Negative for congestion, ear discharge and nosebleeds.   Eyes:  Negative for blurred vision.  Respiratory:  Negative for cough, hemoptysis, sputum production, shortness of breath and wheezing.   Cardiovascular:  Negative for chest pain, palpitations, orthopnea and claudication.  Gastrointestinal:  Negative for abdominal pain, blood in stool, constipation, diarrhea, heartburn, melena, nausea and vomiting.  Genitourinary:  Negative for dysuria, flank pain, frequency, hematuria and urgency.  Musculoskeletal:  Negative for back pain, joint pain and myalgias.  Skin:  Negative for rash.  Neurological:  Negative for dizziness, tingling, focal weakness, seizures, weakness and headaches.  Endo/Heme/Allergies:  Does not bruise/bleed easily.  Psychiatric/Behavioral:  Negative for depression and suicidal ideas. The patient does not have insomnia.       Allergies  Allergen Reactions   Isosorbide   Cough     Past Medical History:  Diagnosis Date   (HFpEF) heart failure with preserved ejection fraction (HCC) 07/25/2016   a.)TTE 07/25/16: EF  >55, triv-mild pan regur, mild AS, G2DD; b.)TTE 09/02/16: EF >55, triv TR, mild AS; c.)TTE 11/05/16: EF >55, LAE, triv-mild pan regur, mild AS, G2DD; d.)TTE 01/21/20: EF >55, mod LVH, LAE/RVE, triv-mild AR/PR/TR, mod MR, mild AS, G2DD; e.)TTE 09/24/22: EF>55, sev LVH, sev LAE, RAE, mild PR, mod AR/MR/TR, mild-mod AS, RVSP 74.7; f.)TTE 12/23/22: EF 60-65, LAE, mild-mod MR/TR, mild AS   Abnormal urine 05/01/2023   Anemia    Aortic atherosclerosis    Aortic stenosis 07/25/2016   a.) TTE 07/25/2016: mild (MPG 13.5); b.) TTE 09/02/2016: mild (MPG 16); c.) TTE 11/05/2016: mild (MPG 9); d.) TTE 01/21/2020: mild (MPG 11.7); e.) TTE 09/24/2022: mild-mod (MPG 20.3); f.) TTE 12/23/2022: mild (MPG 15)   Atrial fibrillation (HCC)    a.) CHA2DS2VASc = 6 (age x2, CHF, HTN, vascular disease history, T2DM);  b.) rate/rhythm maintained on oral labetalol ; chronically anticoagulated with apixaban  + clopidogrel    AV block, 1st degree    B12 deficiency    CKD (chronic kidney disease), stage III (HCC)    Colon polyps    Congestive heart failure (HCC) 05/01/2023   Coronary artery disease 08/16/2016   a.) s/p 3v CABG 08/30/2016; b.) s/p PCI 11/26/2016 (DES x 1 oSVG-RCA); c.) s/p PCI 12/17/2016 (DES x 5 --> mLCx x2, dLCx, pLAD, dLAD); c.) s/p PCI 06/09/2020 (DES x1 --> ISR oSVG-PDA)   Cyst of kidney, acquired 07/03/2023   Dyspnea    Gastroesophageal reflux disease 05/01/2023   GERD (gastroesophageal reflux disease)    Heart murmur    Hepatic steatosis    History of 2019 novel coronavirus disease (COVID-19) 09/02/2020   a.) Tx'd with remdesivir    History of blood transfusion    History of GI bleed 09/02/2020   a.) admitted to [facility] 09/02/2020 - 09/08/2020   History of heart artery stent 11/26/2016   TOTAL of 7 stents: a.) 11/26/2016 --> 3.5 x 22 mm Resolute Integrity oSVG-RCA; b.) 12/17/2016: 3.0 x 12 mm Xience Alpine dLCx, 3.5 x 22 mm Resolute Onyx mLCx, 3.5 x 18 mm Resolute Onyx mLCx, 3.0 x 26 mm Resolute Onyx pLAD, 2.0  x 26 mm Resolute Onyx dLAD; c.) 06/09/2020 --> 2.5 x 18 mm Resolute Onyx ISR oSVG-PDA   History of partial colectomy 08/12/2012   a.) large gastric hyperplastic polyp removal   HLD (hyperlipidemia)    Hypertension    LBBB (left bundle branch block)    Long term current use of anticoagulant    a.) apixaban    Long term current use of antithrombotics/antiplatelets    a.) clopidogrel    Male hypogonadism    a.) on exogenous TRT (1.62% gel)   Mild cardiomegaly    Mild gynecomastia (bilateral)    Nephrolithiasis    OSA (obstructive sleep apnea)    a.) does not utilize nocturnal PAP therapy   Personal history of surgery to heart and great vessels, presenting hazards to health 05/01/2023   Proteinuria, unspecified 08/01/2023   Pulmonary hypertension (HCC) 09/24/2022   a.) TTE 09/24/2022: RVSP 74.7; b.) TTE 12/23/2022: RVSP 31.3   RBBB (right bundle branch block with left anterior fascicular block)    Right inguinal hernia    Right thyroid  nodule    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   a.) LIMA-LAD, SVG-PDA, SVG-OM3   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/01/2023   Type 2 diabetes mellitus  treated with insulin  (HCC)    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   Umbilical hernia    Unstable angina (HCC)    Vitamin D  deficiency      Past Surgical History:  Procedure Laterality Date   APPENDECTOMY     CARDIAC CATHETERIZATION Left 08/16/2016   Procedure: Left Heart Cath and Coronary Angiography;  Surgeon: Cara JONETTA Lovelace, MD;  Location: [facility]VASIVE CV [facility];  Service: Cardiovascular;  Laterality: Left;   COLONOSCOPY N/A 09/07/2020   Procedure: COLONOSCOPY;  Surgeon: Janalyn Keene NOVAK, MD;  Location: [facility] ENDOSCOPY;  Service: Endoscopy;  Laterality: N/A;   COLONOSCOPY N/A 10/19/2023   Procedure: COLONOSCOPY;  Surgeon: Jinny Carmine, MD;  Location: [facility] ENDOSCOPY;  Service: Endoscopy;  Laterality: N/A;   COLONOSCOPY WITH PROPOFOL  N/A 05/17/2021    Procedure: COLONOSCOPY WITH PROPOFOL ;  Surgeon: Janalyn Keene NOVAK, MD;  Location: [facility] ENDOSCOPY;  Service: Endoscopy;  Laterality: N/A;   CORONARY ANGIOPLASTY WITH STENT PLACEMENT Left 11/26/2016   Procedure: CORONARY ANGIOPLASTY WITH STENT PLACEMENT; Location: [facility]; Surgeon: Alm Dais, MD   CORONARY ARTERY BYPASS GRAFT N/A 08/30/2016   Procedure: CORONARY ARTERY BYPASS GRAFT; Location: [facility]; Surgeon: Juliene Pouch, MD   CORONARY STENT INTERVENTION N/A 06/09/2020   Procedure: CORONARY STENT INTERVENTION;  Surgeon: Lovelace Cara JONETTA, MD;  Location: [facility];  Service: Cardiovascular;  Laterality: N/A;   CORONARY STENT INTERVENTION Left 12/17/2016   Procedure: CORONARY STENT INTERVENTION; Location: [facility]; Surgeon: Alm Dais, MD   ENDOSCOPIC MUCOSAL RESECTION N/A 09/22/2020   Procedure: ENDOSCOPIC MUCOSAL RESECTION;  Surgeon: [name] Aloha Raddle., MD;  Location: [facility] ENDOSCOPY;  Service: Gastroenterology;  Laterality: N/A;   ESOPHAGOGASTRODUODENOSCOPY N/A 09/07/2020   Procedure: ESOPHAGOGASTRODUODENOSCOPY (EGD);  Surgeon: Janalyn Keene NOVAK, MD;  Location: [facility] ENDOSCOPY;  Service: Endoscopy;  Laterality: N/A;   ESOPHAGOGASTRODUODENOSCOPY N/A 10/19/2023   Procedure: EGD (ESOPHAGOGASTRODUODENOSCOPY);  Surgeon: Jinny Carmine, MD;  Location: [facility] ENDOSCOPY;  Service: Endoscopy;  Laterality: N/A;   ESOPHAGOGASTRODUODENOSCOPY (EGD) WITH PROPOFOL  N/A 09/22/2020   Procedure: ESOPHAGOGASTRODUODENOSCOPY (EGD) WITH PROPOFOL ;  Surgeon: [name] Aloha Raddle., MD;  Location: [facility] ENDOSCOPY;  Service: Gastroenterology;  Laterality: N/A;   ESOPHAGOGASTRODUODENOSCOPY (EGD) WITH PROPOFOL  N/A 08/26/2023   Procedure: ESOPHAGOGASTRODUODENOSCOPY (EGD) WITH PROPOFOL ;  Surgeon: Jinny Carmine, MD;  Location: [facility] ENDOSCOPY;  Service: Endoscopy;  Laterality: N/A;   FRACTURE SURGERY     HEMOSTASIS CLIP PLACEMENT  09/22/2020   Procedure: HEMOSTASIS CLIP PLACEMENT;  Surgeon: [name] Aloha Raddle., MD;   Location: [facility] ENDOSCOPY;  Service: Gastroenterology;;   HEMOSTASIS CONTROL  09/22/2020   Procedure: HEMOSTASIS CONTROL;  Surgeon: [name] Aloha Raddle., MD;  Location: [facility] ENDOSCOPY;  Service: Gastroenterology;;   INSERTION OF MESH  02/19/2023   Procedure: INSERTION OF MESH;  Surgeon: Desiderio Schanz, MD;  Location: [facility] ORS;  Service: General;;  umbilical   LAPAROSCOPIC PARTIAL RIGHT COLECTOMY Right 08/12/2012   Procedure: LAPAROSCOPIC PARTIAL RIGHT COLECTOMY; Location: [facility]; Surgeon: Unknown Sharps, MD   LEFT HEART CATH AND CORONARY ANGIOGRAPHY Left 06/09/2020   Procedure: LEFT HEART CATH AND CORONARY ANGIOGRAPHY;  Surgeon: Lovelace Cara JONETTA, MD;  Location: [facility];  Service: Cardiovascular;  Laterality: Left;   LEFT HEART CATH AND CORS/GRAFTS ANGIOGRAPHY Left 11/20/2016   Procedure: Left Heart Cath and Cors/Grafts Angiography;  Surgeon: Cara JONETTA Lovelace, MD;  Location: [facility];  Service: Cardiovascular;  Laterality: Left;   POLYPECTOMY  09/22/2020   Procedure: POLYPECTOMY;  Surgeon: Mansouraty, Aloha Raddle., MD;  Location: [facility] ENDOSCOPY;  Service: Gastroenterology;;  POLYPECTOMY  10/19/2023   Procedure: POLYPECTOMY;  Surgeon: Jinny Carmine, MD;  Location: [facility] ENDOSCOPY;  Service: Endoscopy;;   SUBMUCOSAL LIFTING INJECTION  09/22/2020   Procedure: SUBMUCOSAL LIFTING INJECTION;  Surgeon: [name] Aloha Raddle., MD;  Location: [facility] ENDOSCOPY;  Service: Gastroenterology;;   UMBILICAL HERNIA REPAIR N/A 02/19/2023   Procedure: HERNIA REPAIR UMBILICAL ADULT, open;  Surgeon: Desiderio Schanz, MD;  Location: [facility] ORS;  Service: General;  Laterality: N/A;   XI ROBOTIC ASSISTED INGUINAL HERNIA REPAIR WITH MESH Right 02/19/2023   Procedure: XI ROBOTIC ASSISTED INGUINAL HERNIA REPAIR WITH MESH;  Surgeon: Desiderio Schanz, MD;  Location: [facility] ORS;  Service: General;  Laterality: Right;    Social History   Socioeconomic History   Marital status: Married    Spouse name: Not on file   Number of  children: Not on file   Years of education: Not on file   Highest education level: Not on file  Occupational History   Not on file  Tobacco Use   Smoking status: Former    Current packs/day: 0.00    Types: Cigarettes    Quit date: 2000    Years since quitting: 25.8    Passive exposure: Never   Smokeless tobacco: Never  Vaping Use   Vaping status: Never Used  Substance and Sexual Activity   Alcohol use: No   Drug use: No   Sexual activity: Not Currently  Other Topics Concern   Not on file  Social History Narrative   Not on file   Social Drivers of Health   Financial Resource Strain: Low Risk  (05/08/2024)   Received from [facility]   Overall Financial Resource Strain (CARDIA)    Difficulty of Paying Living Expenses: Not hard at all  Food Insecurity: No Food Insecurity (05/08/2024)   Received from [facility]   Hunger Vital Sign    Within the past 12 months, you worried that your food would run out before you got the money to buy more.: Never true    Within the past 12 months, the food you bought just didn't last and you didn't have money to get more.: Never true  Transportation Needs: No Transportation Needs (05/08/2024)   Received from [facility] - Transportation    In the past 12 months, has lack of transportation kept you from medical appointments or from getting medications?: No    Lack of Transportation (Non-Medical): No  Physical Activity: Not on file  Stress: Not on file  Social Connections: Patient Declined (03/02/2024)   Social Connection and Isolation Panel    Frequency of Communication with Friends and Family: Patient declined    Frequency of Social Gatherings with Friends and Family: Patient declined    Attends Religious Services: Patient declined    Database Administrator or Organizations: Patient declined    Attends Banker Meetings: Patient declined    Marital Status: Patient  declined  Intimate Partner Violence: Not At Risk (03/02/2024)   Humiliation, Afraid, Rape, and Kick questionnaire    Fear of Current or Ex-Partner: No    Emotionally Abused: No    Physically Abused: No    Sexually Abused: No    Family History  Problem Relation Age of Onset   Hyperlipidemia Mother    Heart disease Mother    Hypertension Father      Current Outpatient Medications:    acetaminophen  (TYLENOL ) 325 MG tablet, Take 2 tablets (650 mg total) by  mouth every 6 (six) hours as needed for mild pain (pain score 1-3), fever or headache (or Fever >/= 101)., Disp: , Rfl:    amLODipine  (NORVASC ) 5 MG tablet, Take 5 mg by mouth 2 (two) times daily., Disp: , Rfl:    aspirin  EC 81 MG tablet, Take 81 mg by mouth daily., Disp: , Rfl:    atorvastatin  (LIPITOR ) 80 MG tablet, Take 80 mg by mouth.  Take 80 mg by mouth in the morning., Disp: , Rfl:    benzonatate  (TESSALON ) 100 MG capsule, Take 100 mg by mouth. (Patient taking differently: Take 100 mg by mouth as needed for cough.), Disp: , Rfl:    bisacodyl  (DULCOLAX) 5 MG EC tablet, Take 5 mg by mouth as needed for moderate constipation., Disp: , Rfl:    carvedilol  (COREG ) 3.125 MG tablet, Take 1 tablet (3.125 mg total) by mouth 2 (two) times daily with a meal., Disp: 60 tablet, Rfl: 1   clopidogrel  (PLAVIX ) 75 MG tablet, Take 75 mg by mouth daily., Disp: , Rfl:    cyanocobalamin  (VITAMIN B12) 1000 MCG tablet, Take 1,000 mcg by mouth daily., Disp: , Rfl:    dexlansoprazole (DEXILANT) 60 MG capsule, Take 1 capsule by mouth daily., Disp: , Rfl:    empagliflozin  (JARDIANCE ) 10 MG TABS tablet, Take 1 tablet (10 mg total) by mouth daily before breakfast., Disp: 30 tablet, Rfl: 0   Ferrous Sulfate  (IRON ) 325 (65 Fe) MG TABS, Take 1 tablet by mouth daily., Disp: , Rfl:    isosorbide  mononitrate (IMDUR ) 120 MG 24 hr tablet, Take 120 mg by mouth daily., Disp: , Rfl:    lactulose  (CHRONULAC ) 10 GM/15ML solution, Taking 15-20 cc by mouth 3 to 4 times a day  for constiption (Patient taking differently: as needed. Taking 15-20 cc by mouth 3 to 4 times a day for constiption), Disp: 946 mL, Rfl: 2   lactulose , encephalopathy, (CHRONULAC ) 10 GM/15ML SOLN, Take 12.5 mLs by mouth daily as needed (constipation)., Disp: , Rfl:    nitroGLYCERIN  (NITROSTAT ) 0.4 MG SL tablet, Place 0.4 mg under the tongue every 5 (five) minutes as needed., Disp: , Rfl:    sodium zirconium cyclosilicate  (LOKELMA ) 5 g packet, Take 10 g by mouth. Take daily for 3 days then stop, Disp: , Rfl:    torsemide  (DEMADEX ) 20 MG tablet, Take 20 mg by mouth., Disp: , Rfl:    amLODipine  (NORVASC ) 10 MG tablet, Take 10 mg by mouth daily. (Patient not taking: Reported on 06/08/2024), Disp: , Rfl:    amoxicillin (AMOXIL) 500 MG capsule, Take 2,000 mg by mouth. (Patient not taking: Reported on 06/08/2024), Disp: , Rfl:    apixaban  (ELIQUIS ) 5 MG TABS tablet, Take 5 mg by mouth.  Take 5 mg by mouth in the morning and 5 mg in the evening. (Patient not taking: Reported on 04/14/2024), Disp: , Rfl:    [Paused] hydrALAZINE  (APRESOLINE ) 50 MG tablet, Take 1 tablet by mouth 3 (three) times daily. (Patient not taking: Reported on 06/08/2024), Disp: , Rfl:    [Paused] spironolactone  (ALDACTONE ) 25 MG tablet, Take 25 mg by mouth in the morning., Disp: , Rfl:    Testosterone  1.62 % GEL, Apply 2 Pump topically daily. One pump on each arm (Patient not taking: Reported on 06/08/2024), Disp: 225 g, Rfl: 3   Testosterone  1.62 % GEL, Place 1 Pump onto the skin. (Patient not taking: Reported on 06/08/2024), Disp: , Rfl:    torsemide  (DEMADEX ) 20 MG tablet, Take 2 tablets (40 mg  total) by mouth daily. (Patient not taking: Reported on 06/08/2024), Disp: 60 tablet, Rfl: 0   valsartan (DIOVAN) 40 MG tablet, Take 40 mg by mouth., Disp: , Rfl:  No current facility-administered medications for this visit.  Facility-Administered Medications Ordered in Other Visits:    0.9 %  sodium chloride  infusion (Manually program via  Guardrails IV Fluids), 250 mL, Intravenous, Continuous, Melanee Annah BROCKS, MD, Last Rate: 10 mL/hr at 06/08/24 0852, 100 mL at 06/08/24 0852  Physical exam:  Vitals:   06/08/24 1137  BP: (!) 127/47  Pulse: 68  Resp: 16  Temp: 97.6 F (36.4 C)  TempSrc: Tympanic  SpO2: 94%  Weight: 188 lb 9.6 oz (85.5 kg)  Height: 6' 0.5 (1.842 m)   Physical Exam Constitutional:      Comments: Sitting in a wheelchair.  Appears fatigued  Cardiovascular:     Rate and Rhythm: Normal rate and regular rhythm.     Heart sounds: Normal heart sounds.  Pulmonary:     Effort: Pulmonary effort is normal.     Breath sounds: Normal breath sounds.  Abdominal:     General: Bowel sounds are normal.     Palpations: Abdomen is soft.  Musculoskeletal:     Right lower leg: Edema present.     Left lower leg: Edema present.  Skin:    General: Skin is warm and dry.  Neurological:     Mental Status: He is alert and oriented to person, place, and time.      I have personally reviewed labs listed below:    Latest Ref Rng & Units 03/04/2024    3:42 AM  CMP  Glucose 70 - 99 mg/dL 852   BUN 8 - 23 mg/dL 51   Creatinine 9.38 - 1.24 mg/dL 6.94   Sodium 864 - 854 mmol/L 138   Potassium 3.5 - 5.1 mmol/L 5.2   Chloride 98 - 111 mmol/L 105   CO2 22 - 32 mmol/L 23   Calcium  8.9 - 10.3 mg/dL 9.8       Latest Ref Rng & Units 06/05/2024    3:00 PM  CBC  Hemoglobin 13.0 - 17.0 g/dL 7.1   Hematocrit 60.9 - 52.0 % 23.0     Assessment and plan- Patient is a 78 y.o. male here for routine f/u fo anemia of CKD     Anemia due to chronic kidney disease with refractory response to erythropoiesis-stimulating agents Anemia secondary to chronic kidney disease with refractory response to erythropoiesis-stimulating agents. Hemoglobin remains at 7 g/dL despite increased doses, requiring periodic transfusions. Bone marrow's inadequate response is unclear, necessitating further investigation. - Schedule bone marrow biopsy.  I  will also order additional anemia workup next week including LDH, reticulocyte count, myeloma panel, serum free light chain, haptoglobin and Coombs test. - Continue erythropoiesis-stimulating agent injections biweekly. - Coordinate blood work and potential transfusions to minimize waiting periods.  Chronic kidney disease Chronic kidney disease contributes to anemia and complicates management.  Iron  overload secondary to repeated transfusions Iron  overload due to repeated transfusions necessary for anemia management, leading to excess iron  deposition and potential complications. - Discuss potential risks of iron  overload, including cardiac complications.         Visit Diagnosis 1. Symptomatic anemia   2. Anemia of chronic kidney failure, stage 4 (severe) (HCC)      Dr. Annah Melanee, MD, MPH [facility] [phone] 06/08/2024 1:07 PM

## 2024-06-08 NOTE — Telephone Encounter (Signed)
 Per Dr. Darold wrap up Needs bone marrow biopsy sometime in the next 1 to 2 weeks.  Per Clarita Ricker The next available date that I have for a BMB is next Mon 11/3 at 9:30a an arrive at 8:30a.  Outbound call; voice message left.

## 2024-06-09 ENCOUNTER — Other Ambulatory Visit

## 2024-06-09 ENCOUNTER — Ambulatory Visit: Admitting: Oncology

## 2024-06-09 LAB — TYPE AND SCREEN
ABO/RH(D): A POS
Antibody Screen: NEGATIVE
Unit division: 0

## 2024-06-09 LAB — BPAM RBC
Blood Product Expiration Date: 202511222359
ISSUE DATE / TIME: 202510270908
Unit Type and Rh: 6200

## 2024-06-11 NOTE — Progress Notes (Signed)
 Patient for CT Bone Marrow Biopsy on Monday 06/15/24, I called and spoke with the patient on the phone and gave pre-procedure instructions. Pt was made aware to be here at 8:30a, NPO after MN prior to procedure as well as driver post procedure/recovery/discharge. Pt stated understanding.  Called 06/11/24

## 2024-06-12 ENCOUNTER — Other Ambulatory Visit (HOSPITAL_COMMUNITY): Payer: Self-pay | Admitting: Student

## 2024-06-12 DIAGNOSIS — D696 Thrombocytopenia, unspecified: Secondary | ICD-10-CM

## 2024-06-15 ENCOUNTER — Ambulatory Visit
Admission: RE | Admit: 2024-06-15 | Discharge: 2024-06-15 | Disposition: A | Source: Ambulatory Visit | Attending: Oncology

## 2024-06-15 DIAGNOSIS — Z1379 Encounter for other screening for genetic and chromosomal anomalies: Secondary | ICD-10-CM | POA: Diagnosis not present

## 2024-06-15 DIAGNOSIS — D631 Anemia in chronic kidney disease: Secondary | ICD-10-CM | POA: Diagnosis not present

## 2024-06-15 DIAGNOSIS — D649 Anemia, unspecified: Secondary | ICD-10-CM | POA: Diagnosis present

## 2024-06-15 DIAGNOSIS — E1122 Type 2 diabetes mellitus with diabetic chronic kidney disease: Secondary | ICD-10-CM | POA: Insufficient documentation

## 2024-06-15 DIAGNOSIS — I130 Hypertensive heart and chronic kidney disease with heart failure and stage 1 through stage 4 chronic kidney disease, or unspecified chronic kidney disease: Secondary | ICD-10-CM | POA: Diagnosis not present

## 2024-06-15 DIAGNOSIS — Z7984 Long term (current) use of oral hypoglycemic drugs: Secondary | ICD-10-CM | POA: Insufficient documentation

## 2024-06-15 DIAGNOSIS — N183 Chronic kidney disease, stage 3 unspecified: Secondary | ICD-10-CM | POA: Diagnosis not present

## 2024-06-15 DIAGNOSIS — I5032 Chronic diastolic (congestive) heart failure: Secondary | ICD-10-CM | POA: Diagnosis not present

## 2024-06-15 DIAGNOSIS — I4891 Unspecified atrial fibrillation: Secondary | ICD-10-CM | POA: Insufficient documentation

## 2024-06-15 DIAGNOSIS — D696 Thrombocytopenia, unspecified: Secondary | ICD-10-CM | POA: Diagnosis not present

## 2024-06-15 LAB — CBC WITH DIFFERENTIAL/PLATELET
Abs Immature Granulocytes: 0.02 K/uL (ref 0.00–0.07)
Basophils Absolute: 0.1 K/uL (ref 0.0–0.1)
Basophils Relative: 1 %
Eosinophils Absolute: 0.1 K/uL (ref 0.0–0.5)
Eosinophils Relative: 1 %
HCT: 27.6 % — ABNORMAL LOW (ref 39.0–52.0)
Hemoglobin: 8.6 g/dL — ABNORMAL LOW (ref 13.0–17.0)
Immature Granulocytes: 0 %
Lymphocytes Relative: 9 %
Lymphs Abs: 0.7 K/uL (ref 0.7–4.0)
MCH: 30.1 pg (ref 26.0–34.0)
MCHC: 31.2 g/dL (ref 30.0–36.0)
MCV: 96.5 fL (ref 80.0–100.0)
Monocytes Absolute: 0.5 K/uL (ref 0.1–1.0)
Monocytes Relative: 7 %
Neutro Abs: 6 K/uL (ref 1.7–7.7)
Neutrophils Relative %: 82 %
Platelets: 152 K/uL (ref 150–400)
RBC: 2.86 MIL/uL — ABNORMAL LOW (ref 4.22–5.81)
RDW: 18.3 % — ABNORMAL HIGH (ref 11.5–15.5)
WBC: 7.3 K/uL (ref 4.0–10.5)
nRBC: 0 % (ref 0.0–0.2)

## 2024-06-15 MED ORDER — HEPARIN SOD (PORK) LOCK FLUSH 100 UNIT/ML IV SOLN
INTRAVENOUS | Status: AC
  Filled 2024-06-15: qty 5

## 2024-06-15 MED ORDER — MIDAZOLAM HCL 2 MG/2ML IJ SOLN
INTRAMUSCULAR | Status: AC
  Filled 2024-06-15: qty 4

## 2024-06-15 MED ORDER — FENTANYL CITRATE (PF) 100 MCG/2ML IJ SOLN
INTRAMUSCULAR | Status: AC | PRN
  Administered 2024-06-15 (×2): 25 ug via INTRAVENOUS
  Administered 2024-06-15: 50 ug via INTRAVENOUS

## 2024-06-15 MED ORDER — SODIUM CHLORIDE 0.9 % IV SOLN: INTRAVENOUS | Status: DC

## 2024-06-15 MED ORDER — FENTANYL CITRATE (PF) 100 MCG/2ML IJ SOLN
INTRAMUSCULAR | Status: AC
  Filled 2024-06-15: qty 4

## 2024-06-15 MED ORDER — MIDAZOLAM HCL (PF) 2 MG/2ML IJ SOLN
INTRAMUSCULAR | Status: AC | PRN
  Administered 2024-06-15: .5 mg via INTRAVENOUS
  Administered 2024-06-15: 1 mg via INTRAVENOUS
  Administered 2024-06-15: .5 mg via INTRAVENOUS

## 2024-06-15 NOTE — Procedures (Signed)
 Interventional Radiology Procedure Note  Procedure: CT guided bone marrow aspiration and biopsy  Complications: None  EBL: < 10 mL  Findings: Aspirate and core biopsy performed of bone marrow in right iliac bone.  Plan: Bedrest supine x 1 hrs  Ericha Whittingham T. Fredia Sorrow, M.D Pager:  [phone]

## 2024-06-15 NOTE — H&P (Signed)
 Chief Complaint: Patient was seen in consultation today for anemia  Referring Physician(s): Rao,Archana C  Supervising Physician: Luverne Aran  Patient Status: [facility] - Out-pt  History of Present Illness: [name] is a 78 y.o. male with a medical history significant for heart failure, aortic stenosis, atrial fibrillation, DM2, HTN and chronic kidney disease. He also has a history of anemia requiring IV iron  infusions and occasional blood transfusions. His anemia is thought to be related to his chronic kidney disease and he has been treated with ESA injections over the past few months. Despite these injections his hemoglobin levels have not improved.   The patient's Hematology team has recommended a bone marrow biopsy with aspiration for further work up.   Past Medical History:  Diagnosis Date   (HFpEF) heart failure with preserved ejection fraction (HCC) 07/25/2016   a.)TTE 07/25/16: EF >55, triv-mild pan regur, mild AS, G2DD; b.)TTE 09/02/16: EF >55, triv TR, mild AS; c.)TTE 11/05/16: EF >55, LAE, triv-mild pan regur, mild AS, G2DD; d.)TTE 01/21/20: EF >55, mod LVH, LAE/RVE, triv-mild AR/PR/TR, mod MR, mild AS, G2DD; e.)TTE 09/24/22: EF>55, sev LVH, sev LAE, RAE, mild PR, mod AR/MR/TR, mild-mod AS, RVSP 74.7; f.)TTE 12/23/22: EF 60-65, LAE, mild-mod MR/TR, mild AS   Abnormal urine 05/01/2023   Anemia    Aortic atherosclerosis    Aortic stenosis 07/25/2016   a.) TTE 07/25/2016: mild (MPG 13.5); b.) TTE 09/02/2016: mild (MPG 16); c.) TTE 11/05/2016: mild (MPG 9); d.) TTE 01/21/2020: mild (MPG 11.7); e.) TTE 09/24/2022: mild-mod (MPG 20.3); f.) TTE 12/23/2022: mild (MPG 15)   Atrial fibrillation (HCC)    a.) CHA2DS2VASc = 6 (age x2, CHF, HTN, vascular disease history, T2DM);  b.) rate/rhythm maintained on oral labetalol ; chronically anticoagulated with apixaban  + clopidogrel    AV block, 1st degree    B12 deficiency    CKD (chronic kidney disease), stage III (HCC)    Colon polyps     Congestive heart failure (HCC) 05/01/2023   Coronary artery disease 08/16/2016   a.) s/p 3v CABG 08/30/2016; b.) s/p PCI 11/26/2016 (DES x 1 oSVG-RCA); c.) s/p PCI 12/17/2016 (DES x 5 --> mLCx x2, dLCx, pLAD, dLAD); c.) s/p PCI 06/09/2020 (DES x1 --> ISR oSVG-PDA)   Cyst of kidney, acquired 07/03/2023   Dyspnea    Gastroesophageal reflux disease 05/01/2023   GERD (gastroesophageal reflux disease)    Heart murmur    Hepatic steatosis    History of 2019 novel coronavirus disease (COVID-19) 09/02/2020   a.) Tx'd with remdesivir    History of blood transfusion    History of GI bleed 09/02/2020   a.) admitted to [facility] 09/02/2020 - 09/08/2020   History of heart artery stent 11/26/2016   TOTAL of 7 stents: a.) 11/26/2016 --> 3.5 x 22 mm Resolute Integrity oSVG-RCA; b.) 12/17/2016: 3.0 x 12 mm Xience Alpine dLCx, 3.5 x 22 mm Resolute Onyx mLCx, 3.5 x 18 mm Resolute Onyx mLCx, 3.0 x 26 mm Resolute Onyx pLAD, 2.0 x 26 mm Resolute Onyx dLAD; c.) 06/09/2020 --> 2.5 x 18 mm Resolute Onyx ISR oSVG-PDA   History of partial colectomy 08/12/2012   a.) large gastric hyperplastic polyp removal   HLD (hyperlipidemia)    Hypertension    LBBB (left bundle branch block)    Long term current use of anticoagulant    a.) apixaban    Long term current use of antithrombotics/antiplatelets    a.) clopidogrel    Male hypogonadism    a.) on exogenous TRT (1.62% gel)  Mild cardiomegaly    Mild gynecomastia (bilateral)    Nephrolithiasis    OSA (obstructive sleep apnea)    a.) does not utilize nocturnal PAP therapy   Personal history of surgery to heart and great vessels, presenting hazards to health 05/01/2023   Proteinuria, unspecified 08/01/2023   Pulmonary hypertension (HCC) 09/24/2022   a.) TTE 09/24/2022: RVSP 74.7; b.) TTE 12/23/2022: RVSP 31.3   RBBB (right bundle branch block with left anterior fascicular block)    Right inguinal hernia    Right thyroid  nodule    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   a.)  LIMA-LAD, SVG-PDA, SVG-OM3   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/01/2023   Type 2 diabetes mellitus treated with insulin  (HCC)    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   Umbilical hernia    Unstable angina (HCC)    Vitamin D  deficiency     Past Surgical History:  Procedure Laterality Date   ANOMALOUS PULMONARY VENOUS RETURN REPAIR, TOTAL  2025   APPENDECTOMY     CARDIAC CATHETERIZATION Left 08/16/2016   Procedure: Left Heart Cath and Coronary Angiography;  Surgeon: Cara JONETTA Lovelace, MD;  Location: [facility] INVASIVE CV LAB;  Service: Cardiovascular;  Laterality: Left;   COLONOSCOPY N/A 09/07/2020   Procedure: COLONOSCOPY;  Surgeon: Janalyn Keene NOVAK, MD;  Location: [facility] ENDOSCOPY;  Service: Endoscopy;  Laterality: N/A;   COLONOSCOPY N/A 10/19/2023   Procedure: COLONOSCOPY;  Surgeon: Jinny Carmine, MD;  Location: [facility] ENDOSCOPY;  Service: Endoscopy;  Laterality: N/A;   COLONOSCOPY WITH PROPOFOL  N/A 05/17/2021   Procedure: COLONOSCOPY WITH PROPOFOL ;  Surgeon: Janalyn Keene NOVAK, MD;  Location: [facility] ENDOSCOPY;  Service: Endoscopy;  Laterality: N/A;   CORONARY ANGIOPLASTY WITH STENT PLACEMENT Left 11/26/2016   Procedure: CORONARY ANGIOPLASTY WITH STENT PLACEMENT; Location: [facility]; Surgeon: Alm Dais, MD   CORONARY ARTERY BYPASS GRAFT N/A 08/30/2016   Procedure: CORONARY ARTERY BYPASS GRAFT; Location: [facility]; Surgeon: Juliene Pouch, MD   CORONARY STENT INTERVENTION N/A 06/09/2020   Procedure: CORONARY STENT INTERVENTION;  Surgeon: Lovelace Cara JONETTA, MD;  Location: [facility] INV[facility]IVE CV LAB;  Service: Cardiovascular;  Laterality: N/A;   CORONARY STENT INTERVENTION Left 12/17/2016   Procedure: CORONARY STENT INTERVENTION; Location: [facility]; Surgeon: Alm Dais, MD   CORONARY STENT INTERVENTION  03/2024   ENDOSCOPIC MUCOSAL RESECTION N/A 09/22/2020   Procedure: ENDOSCOPIC MUCOSAL RESECTION;  Surgeon: [name] Aloha Raddle., MD;  Location: [facility] ENDOSCOPY;   Service: Gastroenterology;  Laterality: N/A;   ESOPHAGOGASTRODUODENOSCOPY N/A 09/07/2020   Procedure: ESOPHAGOGASTRODUODENOSCOPY (EGD);  Surgeon: Janalyn Keene NOVAK, MD;  Location: [facility] ENDOSCOPY;  Service: Endoscopy;  Laterality: N/A;   ESOPHAGOGASTRODUODENOSCOPY N/A 10/19/2023   Procedure: EGD (ESOPHAGOGASTRODUODENOSCOPY);  Surgeon: Jinny Carmine, MD;  Location: [facility] ENDOSCOPY;  Service: Endoscopy;  Laterality: N/A;   ESOPHAGOGASTRODUODENOSCOPY (EGD) WITH PROPOFOL  N/A 09/22/2020   Procedure: ESOPHAGOGASTRODUODENOSCOPY (EGD) WITH PROPOFOL ;  Surgeon: [name] Aloha Raddle., MD;  Location: [facility] ENDOSCOPY;  Service: Gastroenterology;  Laterality: N/A;   ESOPHAGOGASTRODUODENOSCOPY (EGD) WITH PROPOFOL  N/A 08/26/2023   Procedure: ESOPHAGOGASTRODUODENOSCOPY (EGD) WITH PROPOFOL ;  Surgeon: Jinny Carmine, MD;  Location: [facility] ENDOSCOPY;  Service: Endoscopy;  Laterality: N/A;   FRACTURE SURGERY     HEMOSTASIS CLIP PLACEMENT  09/22/2020   Procedure: HEMOSTASIS CLIP PLACEMENT;  Surgeon: [name] Aloha Raddle., MD;  Location: [facility] ENDOSCOPY;  Service: Gastroenterology;;   HEMOSTASIS CONTROL  09/22/2020   Procedure: HEMOSTASIS CONTROL;  Surgeon: [name] Aloha Raddle., MD;  Location: [facility] ENDOSCOPY;  Service: Gastroenterology;;   INSERTION OF MESH  02/19/2023  Procedure: INSERTION OF MESH;  Surgeon: Desiderio Schanz, MD;  Location: [facility] ORS;  Service: General;;  umbilical   LAPAROSCOPIC PARTIAL RIGHT COLECTOMY Right 08/12/2012   Procedure: LAPAROSCOPIC PARTIAL RIGHT COLECTOMY; Location: [facility]; Surgeon: Unknown Sharps, MD   LEFT HEART CATH AND CORONARY ANGIOGRAPHY Left 06/09/2020   Procedure: LEFT HEART CATH AND CORONARY ANGIOGRAPHY;  Surgeon: Florencio Cara BIRCH, MD;  Location: [facility] INVASIVE CV LAB;  Service: Cardiovascular;  Laterality: Left;   LEFT HEART CATH AND CORS/GRAFTS ANGIOGRAPHY Left 11/20/2016   Procedure: Left Heart Cath and Cors/Grafts Angiography;  Surgeon: Cara BIRCH Florencio, MD;  Location: [facility] INVASIVE  CV LAB;  Service: Cardiovascular;  Laterality: Left;   POLYPECTOMY  09/22/2020   Procedure: POLYPECTOMY;  Surgeon: [name] Aloha Raddle., MD;  Location: [facility] ENDOSCOPY;  Service: Gastroenterology;;   POLYPECTOMY  10/19/2023   Procedure: POLYPECTOMY;  Surgeon: Jinny Carmine, MD;  Location: [facility] ENDOSCOPY;  Service: Endoscopy;;   SUBMUCOSAL LIFTING INJECTION  09/22/2020   Procedure: SUBMUCOSAL LIFTING INJECTION;  Surgeon: [name] Aloha Raddle., MD;  Location: [facility] ENDOSCOPY;  Service: Gastroenterology;;   UMBILICAL HERNIA REPAIR N/A 02/19/2023   Procedure: HERNIA REPAIR UMBILICAL ADULT, open;  Surgeon: Desiderio Schanz, MD;  Location: [facility] ORS;  Service: General;  Laterality: N/A;   XI ROBOTIC ASSISTED INGUINAL HERNIA REPAIR WITH MESH Right 02/19/2023   Procedure: XI ROBOTIC ASSISTED INGUINAL HERNIA REPAIR WITH MESH;  Surgeon: Desiderio Schanz, MD;  Location: [facility] ORS;  Service: General;  Laterality: Right;    Allergies: Isosorbide   Medications: Prior to Admission medications   Medication Sig Start Date End Date Taking? Authorizing Provider  acetaminophen  (TYLENOL ) 325 MG tablet Take 2 tablets (650 mg total) by mouth every 6 (six) hours as needed for mild pain (pain score 1-3), fever or headache (or Fever >/= 101). 10/19/23   Alexander, Natalie, DO  amLODipine  (NORVASC ) 10 MG tablet Take 10 mg by mouth daily. Patient not taking: Reported on 06/08/2024 03/23/24 03/23/25  [provider]  amLODipine  (NORVASC ) 5 MG tablet Take 5 mg by mouth 2 (two) times daily. 07/03/23 07/02/24  [provider]  amoxicillin (AMOXIL) 500 MG capsule Take 2,000 mg by mouth. Patient not taking: Reported on 06/08/2024 04/04/24 04/04/25  [provider]  apixaban  (ELIQUIS ) 5 MG TABS tablet Take 5 mg by mouth.  Take 5 mg by mouth in the morning and 5 mg in the evening. Patient not taking: Reported on 04/14/2024 12/27/22   [provider]  aspirin  EC 81 MG tablet Take 81 mg by mouth daily. 04/02/24  04/02/25  [provider]  atorvastatin  (LIPITOR ) 80 MG tablet Take 80 mg by mouth.  Take 80 mg by mouth in the morning. 12/21/23 12/20/24  [provider]  benzonatate  (TESSALON ) 100 MG capsule Take 100 mg by mouth. Patient taking differently: Take 100 mg by mouth as needed for cough. 12/10/23   [provider]  bisacodyl  (DULCOLAX) 5 MG EC tablet Take 5 mg by mouth as needed for moderate constipation.    [provider]  carvedilol  (COREG ) 3.125 MG tablet Take 1 tablet (3.125 mg total) by mouth 2 (two) times daily with a meal. 03/04/24   Caleen Qualia, MD  clopidogrel  (PLAVIX ) 75 MG tablet Take 75 mg by mouth daily. 03/23/24 03/23/25  [provider]  cyanocobalamin  (VITAMIN B12) 1000 MCG tablet Take 1,000 mcg by mouth daily.    [provider]  dexlansoprazole (DEXILANT) 60 MG capsule Take 1 capsule by mouth daily. 12/25/21   [provider]  empagliflozin  (  JARDIANCE ) 10 MG TABS tablet Take 1 tablet (10 mg total) by mouth daily before breakfast. 08/29/23   Agbata, Tochukwu, MD  Ferrous Sulfate  (IRON ) 325 (65 Fe) MG TABS Take 1 tablet by mouth daily.    [provider]  hydrALAZINE  (APRESOLINE ) 50 MG tablet Take 1 tablet by mouth 3 (three) times daily. Patient not taking: Reported on 06/08/2024 05/10/21   [provider]  isosorbide  mononitrate (IMDUR ) 120 MG 24 hr tablet Take 120 mg by mouth daily. 03/23/24 03/23/25  [provider]  lactulose  (CHRONULAC ) 10 GM/15ML solution Taking 15-20 cc by mouth 3 to 4 times a day for constiption Patient taking differently: as needed. Taking 15-20 cc by mouth 3 to 4 times a day for constiption 10/08/23   Therisa Bi, MD  lactulose , encephalopathy, (CHRONULAC ) 10 GM/15ML SOLN Take 12.5 mLs by mouth daily as needed (constipation). 10/08/23   [provider]  nitroGLYCERIN  (NITROSTAT ) 0.4 MG SL tablet Place 0.4 mg under the tongue every 5 (five) minutes as needed. 07/30/23  07/29/24  [provider]  sodium zirconium cyclosilicate  (LOKELMA ) 5 g packet Take 10 g by mouth. Take daily for 3 days then stop    [provider]  spironolactone  (ALDACTONE ) 25 MG tablet Take 25 mg by mouth in the morning. 12/21/23 12/20/24  [provider]  Testosterone  1.62 % GEL Apply 2 Pump topically daily. One pump on each arm Patient not taking: Reported on 06/08/2024 07/22/23   Helon Kirsch A, PA-C  Testosterone  1.62 % GEL Place 1 Pump onto the skin. Patient not taking: Reported on 06/08/2024 11/23/19   [provider]  torsemide  (DEMADEX ) 20 MG tablet Take 2 tablets (40 mg total) by mouth daily. Patient not taking: Reported on 06/08/2024 08/29/23   Lanetta Lingo, MD  torsemide  (DEMADEX ) 20 MG tablet Take 20 mg by mouth. 03/22/24 04/02/25  [provider]  valsartan (DIOVAN) 40 MG tablet Take 40 mg by mouth. 03/22/24 03/22/25  [provider]     Family History  Problem Relation Age of Onset   Hyperlipidemia Mother    Heart disease Mother    Hypertension Father     Social History   Socioeconomic History   Marital status: Married    Spouse name: Not on file   Number of children: Not on file   Years of education: Not on file   Highest education level: Not on file  Occupational History   Not on file  Tobacco Use   Smoking status: Former    Current packs/day: 0.00    Types: Cigarettes    Quit date: 2000    Years since quitting: 25.8    Passive exposure: Never   Smokeless tobacco: Never  Vaping Use   Vaping status: Never Used  Substance and Sexual Activity   Alcohol use: No   Drug use: No   Sexual activity: Not Currently  Other Topics Concern   Not on file  Social History Narrative   Not on file   Social Drivers of Health   Financial Resource Strain: Low Risk  (05/08/2024)   Received from [facility]   Overall Financial Resource Strain (CARDIA)    Difficulty of Paying Living Expenses: Not  hard at all  Food Insecurity: No Food Insecurity (05/08/2024)   Received from [facility]   Hunger Vital Sign    Within the past 12 months, you worried that your food would run out before you got the money to buy more.:  Never true    Within the past 12 months, the food you bought just didn't last and you didn't have money to get more.: Never true  Transportation Needs: No Transportation Needs (05/08/2024)   Received from [facility] - Transportation    In the past 12 months, has lack of transportation kept you from medical appointments or from getting medications?: No    Lack of Transportation (Non-Medical): No  Physical Activity: Not on file  Stress: Not on file  Social Connections: Patient Declined (03/02/2024)   Social Connection and Isolation Panel    Frequency of Communication with Friends and Family: Patient declined    Frequency of Social Gatherings with Friends and Family: Patient declined    Attends Religious Services: Patient declined    Database Administrator or Organizations: Patient declined    Attends Banker Meetings: Patient declined    Marital Status: Patient declined    Review of Systems: A 12 point ROS discussed and pertinent positives are indicated in the HPI above.  All other systems are negative.  Review of Systems  All other systems reviewed and are negative.   Vital Signs: BP (!) 182/62   Pulse 67   Temp (!) 97.3 F (36.3 C) (Temporal)   Resp 13   Ht 6' 0.5 (1.842 m)   Wt 189 lb (85.7 kg)   SpO2 94%   BMI 25.28 kg/m   Physical Exam Constitutional:      General: He is not in acute distress.    Appearance: He is not ill-appearing.  HENT:     Mouth/Throat:     Mouth: Mucous membranes are moist.     Pharynx: Oropharynx is clear.  Cardiovascular:     Rate and Rhythm: Normal rate.  Pulmonary:     Effort: Pulmonary effort is normal.  Abdominal:     Tenderness: There is no abdominal  tenderness.  Skin:    General: Skin is warm and dry.  Neurological:     Mental Status: He is alert and oriented to person, place, and time.     Imaging: No results found.  Labs:  CBC: Recent Labs    03/04/24 0342 04/14/24 1349 04/24/24 1511 05/08/24 1411 05/22/24 1451 06/05/24 1500 06/15/24 0836  WBC 7.5 7.4  --  8.5  --   --  7.3  HGB 8.1* 7.6*   < > 8.0* 7.3* 7.1* 8.6*  HCT 25.4* 24.5*   < > 25.4* 23.8* 23.0* 27.6*  PLT 101* 179  --  179  --   --  152   < > = values in this interval not displayed.    COAGS: Recent Labs    08/01/23 1304 03/02/24 1314  INR 1.2 1.7*    BMP: Recent Labs    03/02/24 1314 03/02/24 2154 03/03/24 0330 03/04/24 0342  NA 139 139 141 138  K 5.2* 5.3* 5.1 5.2*  CL 106 105 107 105  CO2 24 19* 25 23  GLUCOSE 184* 248* 143* 147*  BUN 50* 50* 50* 51*  CALCIUM  10.2 10.5* 10.2 9.8  CREATININE 2.82* 2.90* 2.71* 3.05*  GFRNONAA 22* 22* 24* 20*    LIVER FUNCTION TESTS: Recent Labs    08/01/23 1304 08/23/23 0452 10/24/23 1331  BILITOT 0.4 1.5* 0.8  AST 18 18 48*  ALT 19 17 49*  ALKPHOS 115 85 109  PROT 6.9 6.9 6.6  ALBUMIN 4.3 4.1 3.3*    TUMOR MARKERS: No results for input(s):  AFPTM, CEA, CA199, CHROMGRNA in the last 8760 hours.  Assessment and Plan:  Anemia of unclear etiology: [name]. 63, 78 year old male, presents today for an image-guided bone marrow biopsy with aspiration.   Risks and benefits of this procedure were discussed with the patient and/or patient's family including, but not limited to bleeding, infection, damage to adjacent structures or low yield requiring additional tests.  All of the questions were answered and there is agreement to proceed. He has been NPO.   Consent signed and in chart.  Thank you for this interesting consult.  I greatly enjoyed meeting [name] and look forward to participating in their care.  A copy of this report was sent to the requesting provider on this  date.  Electronically Signed: Warren Dais, AGACNP-BC 06/15/2024, 9:14 AM   I spent a total of  30 Minutes   in face to face in clinical consultation, greater than 50% of which was counseling/coordinating care for anemia.

## 2024-06-15 NOTE — Discharge Instructions (Signed)
 Bone Marrow Aspiration and Bone Marrow Biopsy, Adult, Care After This sheet gives you information about how to care for yourself after your procedure. If you have problems or questions, contact your health care provider.  What can I expect after the procedure?  After the procedure, it is common to have: Mild pain and tenderness. Swelling. Bruising.  Follow these instructions at home: Take over-the-counter or prescription medicines only as told by your health care provider. You may shower tomorrow Remove band aid tomorrow, replace with another bandaid if  site has any drainage from biopsy site. Wash your hands with soap and water before you touch your biopsy site  If soap and water are not available, use hand sanitizer. Change your dressing frequently for bleeding and/or drainage. Check your puncture site every day for signs of infection. Check for: More redness, swelling, or pain. More fluid or blood. Warmth. Pus or a bad smell. Return to your normal activities in 24hours.  Do not drive for 24 hours if you were given a medicine to help you relax (sedative). Keep all follow-up visits as told by your health care provider. This is important. Contact a health care provider if: You have more redness, swelling, or pain around the puncture site. You have more fluid or blood coming from the puncture site. Your puncture site feels warm to the touch. You have pus or a bad smell coming from the puncture site. You have a fever. Your pain is not controlled with medicine. This information is not intended to replace advice given to you by your health care provider. Make sure you discuss any questions you have with your health care provider. Document Released: 02/16/2005 Document Revised: 02/17/2016 Document Reviewed: 01/11/2016 Elsevier Interactive Patient Education  2018 [employer].

## 2024-06-16 ENCOUNTER — Telehealth: Payer: Self-pay

## 2024-06-16 NOTE — Telephone Encounter (Signed)
 Bm bx done yesterday 06/15/24; per Dr. Melanee add ngs myeloid.  Outbound call; spoke to [place] who added ngs myeloid.

## 2024-06-18 ENCOUNTER — Inpatient Hospital Stay

## 2024-06-18 ENCOUNTER — Telehealth: Payer: Self-pay

## 2024-06-18 ENCOUNTER — Inpatient Hospital Stay: Attending: Oncology

## 2024-06-18 DIAGNOSIS — I130 Hypertensive heart and chronic kidney disease with heart failure and stage 1 through stage 4 chronic kidney disease, or unspecified chronic kidney disease: Secondary | ICD-10-CM | POA: Insufficient documentation

## 2024-06-18 DIAGNOSIS — N184 Chronic kidney disease, stage 4 (severe): Secondary | ICD-10-CM | POA: Diagnosis present

## 2024-06-18 DIAGNOSIS — D631 Anemia in chronic kidney disease: Secondary | ICD-10-CM | POA: Insufficient documentation

## 2024-06-18 DIAGNOSIS — D508 Other iron deficiency anemias: Secondary | ICD-10-CM

## 2024-06-18 DIAGNOSIS — D649 Anemia, unspecified: Secondary | ICD-10-CM

## 2024-06-18 LAB — CBC WITH DIFFERENTIAL (CANCER CENTER ONLY)
Abs Immature Granulocytes: 0.06 K/uL (ref 0.00–0.07)
Basophils Absolute: 0.1 K/uL (ref 0.0–0.1)
Basophils Relative: 1 %
Eosinophils Absolute: 0.1 K/uL (ref 0.0–0.5)
Eosinophils Relative: 2 %
HCT: 24.1 % — ABNORMAL LOW (ref 39.0–52.0)
Hemoglobin: 7.5 g/dL — ABNORMAL LOW (ref 13.0–17.0)
Immature Granulocytes: 1 %
Lymphocytes Relative: 10 %
Lymphs Abs: 0.7 K/uL (ref 0.7–4.0)
MCH: 29.9 pg (ref 26.0–34.0)
MCHC: 31.1 g/dL (ref 30.0–36.0)
MCV: 96 fL (ref 80.0–100.0)
Monocytes Absolute: 0.7 K/uL (ref 0.1–1.0)
Monocytes Relative: 10 %
Neutro Abs: 5.2 K/uL (ref 1.7–7.7)
Neutrophils Relative %: 76 %
Platelet Count: 145 K/uL — ABNORMAL LOW (ref 150–400)
RBC: 2.51 MIL/uL — ABNORMAL LOW (ref 4.22–5.81)
RDW: 18.6 % — ABNORMAL HIGH (ref 11.5–15.5)
WBC Count: 6.8 K/uL (ref 4.0–10.5)
nRBC: 0 % (ref 0.0–0.2)

## 2024-06-18 LAB — CMP (CANCER CENTER ONLY)
ALT: 14 U/L (ref 0–44)
AST: 29 U/L (ref 15–41)
Albumin: 3.4 g/dL — ABNORMAL LOW (ref 3.5–5.0)
Alkaline Phosphatase: 100 U/L (ref 38–126)
Anion gap: 11 (ref 5–15)
BUN: 31 mg/dL — ABNORMAL HIGH (ref 8–23)
CO2: 26 mmol/L (ref 22–32)
Calcium: 10 mg/dL (ref 8.9–10.3)
Chloride: 99 mmol/L (ref 98–111)
Creatinine: 1.88 mg/dL — ABNORMAL HIGH (ref 0.61–1.24)
GFR, Estimated: 37 mL/min — ABNORMAL LOW (ref >60)
Glucose, Bld: 139 mg/dL — ABNORMAL HIGH (ref 70–99)
Potassium: 4.2 mmol/L (ref 3.5–5.1)
Sodium: 136 mmol/L (ref 135–145)
Total Bilirubin: 1.4 mg/dL — ABNORMAL HIGH (ref 0.0–1.2)
Total Protein: 7.5 g/dL (ref 6.5–8.1)

## 2024-06-18 LAB — DIRECT ANTIGLOBULIN TEST (NOT AT ARMC)
DAT, IgG: NEGATIVE
DAT, complement: NEGATIVE

## 2024-06-18 LAB — SAMPLE TO BLOOD BANK

## 2024-06-18 LAB — SURGICAL PATHOLOGY

## 2024-06-18 LAB — RETICULOCYTES
Immature Retic Fract: 16.8 % — ABNORMAL HIGH (ref 2.3–15.9)
RBC.: 2.54 MIL/uL — ABNORMAL LOW (ref 4.22–5.81)
Retic Count, Absolute: 130.3 K/uL (ref 19.0–186.0)
Retic Ct Pct: 5.1 % — ABNORMAL HIGH (ref 0.4–3.1)

## 2024-06-18 NOTE — Telephone Encounter (Signed)
 Per secure chat hgb 7.5, platelets 145, advise for tomorrow's possible blood transfusion, ty.  Per Dr. Melanee no blood.  Outbound call to patient; informed of above and patient is aware no blood transfusion for tomorrow.

## 2024-06-19 ENCOUNTER — Inpatient Hospital Stay

## 2024-06-19 ENCOUNTER — Ambulatory Visit: Payer: Self-pay | Admitting: Oncology

## 2024-06-19 LAB — KAPPA/LAMBDA LIGHT CHAINS
Kappa free light chain: 114 mg/L — ABNORMAL HIGH (ref 3.3–19.4)
Kappa, lambda light chain ratio: 1.53 (ref 0.26–1.65)
Lambda free light chains: 74.6 mg/L — ABNORMAL HIGH (ref 5.7–26.3)

## 2024-06-19 LAB — HAPTOGLOBIN: Haptoglobin: <10 mg/dL — ABNORMAL LOW (ref 34–355)

## 2024-06-22 ENCOUNTER — Inpatient Hospital Stay

## 2024-06-22 NOTE — Telephone Encounter (Signed)
 Spoke to [employer] who will check status on [employer] sight.  He will call me back shortly with an update.

## 2024-06-22 NOTE — Telephone Encounter (Signed)
 Voicemail received from [place] today 06/22/24 at 11:25am requesting a call back on [phone].  Outbound call, spoke to [employer] who indicated there are 9 remaining days for the ngs myeloid to result.

## 2024-06-23 ENCOUNTER — Encounter (HOSPITAL_COMMUNITY): Payer: Self-pay

## 2024-06-23 LAB — MULTIPLE MYELOMA PANEL, SERUM
Albumin SerPl Elph-Mcnc: 3.2 g/dL (ref 2.9–4.4)
Albumin/Glob SerPl: 0.9 (ref 0.7–1.7)
Alpha 1: 0.4 g/dL (ref 0.0–0.4)
Alpha2 Glob SerPl Elph-Mcnc: 0.6 g/dL (ref 0.4–1.0)
B-Globulin SerPl Elph-Mcnc: 1.3 g/dL (ref 0.7–1.3)
Gamma Glob SerPl Elph-Mcnc: 1.3 g/dL (ref 0.4–1.8)
Globulin, Total: 3.6 g/dL (ref 2.2–3.9)
IgA: 659 mg/dL — ABNORMAL HIGH (ref 61–437)
IgG (Immunoglobin G), Serum: 1641 mg/dL — ABNORMAL HIGH (ref 603–1613)
IgM (Immunoglobulin M), Srm: 41 mg/dL (ref 15–143)
M Protein SerPl Elph-Mcnc: 0.4 g/dL — ABNORMAL HIGH
Total Protein ELP: 6.8 g/dL (ref 6.0–8.5)

## 2024-06-24 ENCOUNTER — Inpatient Hospital Stay

## 2024-06-25 ENCOUNTER — Other Ambulatory Visit: Payer: Self-pay | Admitting: Nurse Practitioner

## 2024-06-25 ENCOUNTER — Inpatient Hospital Stay

## 2024-06-25 DIAGNOSIS — I130 Hypertensive heart and chronic kidney disease with heart failure and stage 1 through stage 4 chronic kidney disease, or unspecified chronic kidney disease: Secondary | ICD-10-CM | POA: Diagnosis not present

## 2024-06-25 DIAGNOSIS — D649 Anemia, unspecified: Secondary | ICD-10-CM

## 2024-06-25 DIAGNOSIS — D631 Anemia in chronic kidney disease: Secondary | ICD-10-CM

## 2024-06-25 LAB — IRON AND TIBC
Iron: 66 ug/dL (ref 45–182)
Saturation Ratios: 26 % (ref 17.9–39.5)
TIBC: 256 ug/dL (ref 250–450)
UIBC: 191 ug/dL

## 2024-06-25 LAB — HEMOGLOBIN AND HEMATOCRIT (CANCER CENTER ONLY)
HCT: 23.2 % — ABNORMAL LOW (ref 39.0–52.0)
Hemoglobin: 7.2 g/dL — ABNORMAL LOW (ref 13.0–17.0)

## 2024-06-25 LAB — PREPARE RBC (CROSSMATCH)

## 2024-06-25 LAB — FERRITIN: Ferritin: 1187 ng/mL — ABNORMAL HIGH (ref 24–336)

## 2024-06-26 ENCOUNTER — Inpatient Hospital Stay

## 2024-06-26 ENCOUNTER — Telehealth: Payer: Self-pay | Admitting: Oncology

## 2024-06-26 ENCOUNTER — Telehealth: Payer: Self-pay

## 2024-06-26 ENCOUNTER — Other Ambulatory Visit: Payer: Self-pay

## 2024-06-26 DIAGNOSIS — D649 Anemia, unspecified: Secondary | ICD-10-CM

## 2024-06-26 DIAGNOSIS — I130 Hypertensive heart and chronic kidney disease with heart failure and stage 1 through stage 4 chronic kidney disease, or unspecified chronic kidney disease: Secondary | ICD-10-CM | POA: Diagnosis not present

## 2024-06-26 MED ORDER — ACETAMINOPHEN 325 MG PO TABS
650.0000 mg | ORAL_TABLET | Freq: Once | ORAL | Status: AC
  Administered 2024-06-26: 650 mg via ORAL
  Filled 2024-06-26: qty 2

## 2024-06-26 MED ORDER — DIPHENHYDRAMINE HCL 25 MG PO CAPS
25.0000 mg | ORAL_CAPSULE | Freq: Once | ORAL | Status: AC
  Administered 2024-06-26: 25 mg via ORAL
  Filled 2024-06-26: qty 1

## 2024-06-26 MED ORDER — SODIUM CHLORIDE 0.9% IV SOLUTION
250.0000 mL | INTRAVENOUS | Status: DC
  Filled 2024-06-26: qty 250

## 2024-06-26 NOTE — Telephone Encounter (Signed)
 Spoke to patient and informed he would be receiving blood transfusion at center today at 1:00pm and to arrive 15 minutes prior. Patient said he had a really rough night last night, was SOB no energy chest discomfort on / off.  Patient also inquired about most recent bm bx results. Advised patient I would forward inquiry to Dr. Melanee.  Per secure chat received megan please move this pt to my schedule next Thursday 11/20 at 11.45. cancel laurens appt. will discuss bm bx results in peson.

## 2024-06-26 NOTE — Telephone Encounter (Signed)
 Patient has appointment with Dr. Melanee on 07/02/24.  Will call on 11/18 or 11/19 to check status of NGS.

## 2024-06-26 NOTE — Telephone Encounter (Signed)
 Patient called requesting a call back asap to let him know if he needs blood today. He has an appointment today at 1 pm.

## 2024-06-26 NOTE — Telephone Encounter (Signed)
 Already communicated with patient and informed blood transfusion on schedule today 06/26/24 at 1pm.

## 2024-06-27 LAB — BPAM RBC
Blood Product Expiration Date: 202511182359
ISSUE DATE / TIME: 202511141317
Unit Type and Rh: 6200

## 2024-06-27 LAB — TYPE AND SCREEN
ABO/RH(D): A POS
Antibody Screen: NEGATIVE
Unit division: 0

## 2024-07-01 ENCOUNTER — Telehealth: Payer: Self-pay | Admitting: Oncology

## 2024-07-01 ENCOUNTER — Inpatient Hospital Stay
Admission: EM | Admit: 2024-07-01 | Discharge: 2024-07-05 | DRG: 286 | Disposition: A | Discharge status: Home / Self Care | Source: Ambulatory Visit | Attending: Internal Medicine | Admitting: Internal Medicine

## 2024-07-01 ENCOUNTER — Encounter: Payer: Self-pay | Admitting: Cardiology

## 2024-07-01 ENCOUNTER — Encounter: Payer: Self-pay | Admitting: Oncology

## 2024-07-01 ENCOUNTER — Other Ambulatory Visit: Payer: Self-pay

## 2024-07-01 ENCOUNTER — Encounter
Admission: RE | Disposition: A | Discharge status: Still In Hospital | Payer: Self-pay | Source: Home / Self Care | Attending: Cardiology

## 2024-07-01 ENCOUNTER — Emergency Department

## 2024-07-01 ENCOUNTER — Ambulatory Visit
Admission: RE | Admit: 2024-07-01 | Discharge: 2024-07-01 | Disposition: A | Discharge status: Still In Hospital | Source: Home / Self Care | Attending: Cardiology | Admitting: Cardiology

## 2024-07-01 DIAGNOSIS — I272 Pulmonary hypertension, unspecified: Secondary | ICD-10-CM | POA: Diagnosis present

## 2024-07-01 DIAGNOSIS — Z8349 Family history of other endocrine, nutritional and metabolic diseases: Secondary | ICD-10-CM

## 2024-07-01 DIAGNOSIS — Z7901 Long term (current) use of anticoagulants: Secondary | ICD-10-CM

## 2024-07-01 DIAGNOSIS — E1122 Type 2 diabetes mellitus with diabetic chronic kidney disease: Secondary | ICD-10-CM | POA: Diagnosis present

## 2024-07-01 DIAGNOSIS — R0602 Shortness of breath: Secondary | ICD-10-CM | POA: Diagnosis present

## 2024-07-01 DIAGNOSIS — Z7902 Long term (current) use of antithrombotics/antiplatelets: Secondary | ICD-10-CM

## 2024-07-01 DIAGNOSIS — Z951 Presence of aortocoronary bypass graft: Secondary | ICD-10-CM | POA: Insufficient documentation

## 2024-07-01 DIAGNOSIS — Z794 Long term (current) use of insulin: Secondary | ICD-10-CM | POA: Diagnosis not present

## 2024-07-01 DIAGNOSIS — R0600 Dyspnea, unspecified: Principal | ICD-10-CM

## 2024-07-01 DIAGNOSIS — I700 Atherosclerosis of aorta: Secondary | ICD-10-CM | POA: Diagnosis present

## 2024-07-01 DIAGNOSIS — J9621 Acute and chronic respiratory failure with hypoxia: Secondary | ICD-10-CM | POA: Diagnosis present

## 2024-07-01 DIAGNOSIS — Z952 Presence of prosthetic heart valve: Secondary | ICD-10-CM

## 2024-07-01 DIAGNOSIS — E1169 Type 2 diabetes mellitus with other specified complication: Secondary | ICD-10-CM | POA: Diagnosis present

## 2024-07-01 DIAGNOSIS — Z7984 Long term (current) use of oral hypoglycemic drugs: Secondary | ICD-10-CM

## 2024-07-01 DIAGNOSIS — I130 Hypertensive heart and chronic kidney disease with heart failure and stage 1 through stage 4 chronic kidney disease, or unspecified chronic kidney disease: Secondary | ICD-10-CM | POA: Diagnosis present

## 2024-07-01 DIAGNOSIS — I2489 Other forms of acute ischemic heart disease: Secondary | ICD-10-CM | POA: Diagnosis present

## 2024-07-01 DIAGNOSIS — Z8616 Personal history of COVID-19: Secondary | ICD-10-CM

## 2024-07-01 DIAGNOSIS — Z860100 Personal history of colon polyps, unspecified: Secondary | ICD-10-CM

## 2024-07-01 DIAGNOSIS — E785 Hyperlipidemia, unspecified: Secondary | ICD-10-CM | POA: Diagnosis not present

## 2024-07-01 DIAGNOSIS — J90 Pleural effusion, not elsewhere classified: Secondary | ICD-10-CM | POA: Diagnosis present

## 2024-07-01 DIAGNOSIS — Z8249 Family history of ischemic heart disease and other diseases of the circulatory system: Secondary | ICD-10-CM

## 2024-07-01 DIAGNOSIS — I2510 Atherosclerotic heart disease of native coronary artery without angina pectoris: Secondary | ICD-10-CM | POA: Diagnosis present

## 2024-07-01 DIAGNOSIS — Z9049 Acquired absence of other specified parts of digestive tract: Secondary | ICD-10-CM

## 2024-07-01 DIAGNOSIS — D631 Anemia in chronic kidney disease: Secondary | ICD-10-CM

## 2024-07-01 DIAGNOSIS — N1832 Chronic kidney disease, stage 3b: Secondary | ICD-10-CM | POA: Diagnosis present

## 2024-07-01 DIAGNOSIS — Z79899 Other long term (current) drug therapy: Secondary | ICD-10-CM

## 2024-07-01 DIAGNOSIS — I4819 Other persistent atrial fibrillation: Secondary | ICD-10-CM | POA: Diagnosis present

## 2024-07-01 DIAGNOSIS — Z87442 Personal history of urinary calculi: Secondary | ICD-10-CM

## 2024-07-01 DIAGNOSIS — G4733 Obstructive sleep apnea (adult) (pediatric): Secondary | ICD-10-CM | POA: Diagnosis present

## 2024-07-01 DIAGNOSIS — K760 Fatty (change of) liver, not elsewhere classified: Secondary | ICD-10-CM | POA: Diagnosis present

## 2024-07-01 DIAGNOSIS — Z7982 Long term (current) use of aspirin: Secondary | ICD-10-CM | POA: Diagnosis not present

## 2024-07-01 DIAGNOSIS — I340 Nonrheumatic mitral (valve) insufficiency: Secondary | ICD-10-CM | POA: Diagnosis not present

## 2024-07-01 DIAGNOSIS — Z955 Presence of coronary angioplasty implant and graft: Secondary | ICD-10-CM

## 2024-07-01 DIAGNOSIS — I509 Heart failure, unspecified: Secondary | ICD-10-CM | POA: Diagnosis not present

## 2024-07-01 DIAGNOSIS — I5033 Acute on chronic diastolic (congestive) heart failure: Secondary | ICD-10-CM | POA: Diagnosis present

## 2024-07-01 DIAGNOSIS — I10 Essential (primary) hypertension: Secondary | ICD-10-CM | POA: Diagnosis not present

## 2024-07-01 DIAGNOSIS — I480 Paroxysmal atrial fibrillation: Secondary | ICD-10-CM | POA: Diagnosis present

## 2024-07-01 DIAGNOSIS — Z87891 Personal history of nicotine dependence: Secondary | ICD-10-CM

## 2024-07-01 DIAGNOSIS — I5032 Chronic diastolic (congestive) heart failure: Secondary | ICD-10-CM | POA: Insufficient documentation

## 2024-07-01 DIAGNOSIS — E7849 Other hyperlipidemia: Secondary | ICD-10-CM | POA: Diagnosis present

## 2024-07-01 DIAGNOSIS — N184 Chronic kidney disease, stage 4 (severe): Secondary | ICD-10-CM | POA: Insufficient documentation

## 2024-07-01 DIAGNOSIS — J9601 Acute respiratory failure with hypoxia: Secondary | ICD-10-CM | POA: Diagnosis not present

## 2024-07-01 DIAGNOSIS — Z9889 Other specified postprocedural states: Secondary | ICD-10-CM

## 2024-07-01 DIAGNOSIS — E119 Type 2 diabetes mellitus without complications: Secondary | ICD-10-CM

## 2024-07-01 DIAGNOSIS — I454 Nonspecific intraventricular block: Secondary | ICD-10-CM | POA: Insufficient documentation

## 2024-07-01 HISTORY — PX: RIGHT HEART CATH: CATH118263

## 2024-07-01 LAB — CBC
HCT: 27.1 % — ABNORMAL LOW (ref 39.0–52.0)
HCT: 27 % — ABNORMAL LOW (ref 39.0–52.0)
Hemoglobin: 8.5 g/dL — ABNORMAL LOW (ref 13.0–17.0)
Hemoglobin: 8.4 g/dL — ABNORMAL LOW (ref 13.0–17.0)
MCH: 30.8 pg (ref 26.0–34.0)
MCH: 30.7 pg (ref 26.0–34.0)
MCHC: 31.4 g/dL (ref 30.0–36.0)
MCHC: 31.1 g/dL (ref 30.0–36.0)
MCV: 98.2 fL (ref 80.0–100.0)
MCV: 98.5 fL (ref 80.0–100.0)
Platelets: 181 K/uL (ref 150–400)
Platelets: 156 K/uL (ref 150–400)
RBC: 2.76 MIL/uL — ABNORMAL LOW (ref 4.22–5.81)
RBC: 2.74 MIL/uL — ABNORMAL LOW (ref 4.22–5.81)
RDW: 21 % — ABNORMAL HIGH (ref 11.5–15.5)
RDW: 20.6 % — ABNORMAL HIGH (ref 11.5–15.5)
WBC: 8.7 K/uL (ref 4.0–10.5)
WBC: 6.7 K/uL (ref 4.0–10.5)
nRBC: 0 % (ref 0.0–0.2)
nRBC: 0 % (ref 0.0–0.2)

## 2024-07-01 LAB — POCT I-STAT 7, (LYTES, BLD GAS, ICA,H+H)
Acid-Base Excess: 5 mmol/L — ABNORMAL HIGH (ref 0.0–2.0)
Bicarbonate: 29.4 mmol/L — ABNORMAL HIGH (ref 20.0–28.0)
Calcium, Ion: 1.37 mmol/L (ref 1.15–1.40)
HCT: 24 % — ABNORMAL LOW (ref 39.0–52.0)
Hemoglobin: 8.2 g/dL — ABNORMAL LOW (ref 13.0–17.0)
O2 Saturation: 36 %
Potassium: 4.3 mmol/L (ref 3.5–5.1)
Sodium: 136 mmol/L (ref 135–145)
TCO2: 31 mmol/L (ref 22–32)
pCO2 arterial: 43.3 mmHg (ref 32–48)
pH, Arterial: 7.439 (ref 7.35–7.45)
pO2, Arterial: 21 mmHg — CL (ref 83–108)

## 2024-07-01 LAB — BASIC METABOLIC PANEL WITH GFR
Anion gap: 12 (ref 5–15)
BUN: 30 mg/dL — ABNORMAL HIGH (ref 8–23)
CO2: 24 mmol/L (ref 22–32)
Calcium: 10.5 mg/dL — ABNORMAL HIGH (ref 8.9–10.3)
Chloride: 98 mmol/L (ref 98–111)
Creatinine, Ser: 1.98 mg/dL — ABNORMAL HIGH (ref 0.61–1.24)
GFR, Estimated: 34 mL/min — ABNORMAL LOW (ref >60)
Glucose, Bld: 158 mg/dL — ABNORMAL HIGH (ref 70–99)
Potassium: 4.6 mmol/L (ref 3.5–5.1)
Sodium: 135 mmol/L (ref 135–145)

## 2024-07-01 LAB — PRO BRAIN NATRIURETIC PEPTIDE: Pro Brain Natriuretic Peptide: 12167 pg/mL — ABNORMAL HIGH (ref <300.0)

## 2024-07-01 LAB — GLUCOSE, CAPILLARY
Glucose-Capillary: 136 mg/dL — ABNORMAL HIGH (ref 70–99)
Glucose-Capillary: 154 mg/dL — ABNORMAL HIGH (ref 70–99)

## 2024-07-01 LAB — TROPONIN T, HIGH SENSITIVITY
Troponin T High Sensitivity: 121 ng/L (ref 0–19)
Troponin T High Sensitivity: 132 ng/L (ref 0–19)

## 2024-07-01 LAB — MAGNESIUM: Magnesium: 2.3 mg/dL (ref 1.7–2.4)

## 2024-07-01 MED ORDER — LIDOCAINE HCL 1 % IJ SOLN
INTRAMUSCULAR | Status: AC
  Filled 2024-07-01: qty 20

## 2024-07-01 MED ORDER — FUROSEMIDE 10 MG/ML IJ SOLN: 60.0000 mg | Freq: Once | INTRAMUSCULAR | Status: DC

## 2024-07-01 MED ORDER — ASPIRIN 81 MG PO TBEC
81.0000 mg | DELAYED_RELEASE_TABLET | Freq: Every day | ORAL | Status: DC
  Administered 2024-07-01 – 2024-07-05 (×5): 81 mg via ORAL
  Filled 2024-07-01 (×5): qty 1

## 2024-07-01 MED ORDER — FUROSEMIDE 10 MG/ML IJ SOLN: 40.0000 mg | Freq: Once | INTRAMUSCULAR | Status: DC

## 2024-07-01 MED ORDER — EMPAGLIFLOZIN 10 MG PO TABS
10.0000 mg | ORAL_TABLET | Freq: Every day | ORAL | Status: DC
  Administered 2024-07-02 – 2024-07-05 (×4): 10 mg via ORAL
  Filled 2024-07-01 (×4): qty 1

## 2024-07-01 MED ORDER — SODIUM CHLORIDE 0.9 % IV SOLN
250.0000 mL | INTRAVENOUS | Status: DC | PRN
  Administered 2024-07-01: 250 mL via INTRAVENOUS

## 2024-07-01 MED ORDER — ACETAMINOPHEN 650 MG RE SUPP: 650.0000 mg | Freq: Four times a day (QID) | RECTAL | Status: DC | PRN

## 2024-07-01 MED ORDER — FERROUS SULFATE 325 (65 FE) MG PO TABS
325.0000 mg | ORAL_TABLET | Freq: Every day | ORAL | Status: DC
  Administered 2024-07-02 – 2024-07-05 (×4): 325 mg via ORAL
  Filled 2024-07-01 (×4): qty 1

## 2024-07-01 MED ORDER — FREE WATER: 500.0000 mL | Freq: Once | Status: DC

## 2024-07-01 MED ORDER — ACETAMINOPHEN 325 MG PO TABS: 650.0000 mg | ORAL_TABLET | Freq: Four times a day (QID) | ORAL | Status: DC | PRN

## 2024-07-01 MED ORDER — CLOPIDOGREL BISULFATE 75 MG PO TABS
75.0000 mg | ORAL_TABLET | Freq: Every day | ORAL | Status: DC
  Administered 2024-07-01 – 2024-07-05 (×5): 75 mg via ORAL
  Filled 2024-07-01 (×5): qty 1

## 2024-07-01 MED ORDER — PANTOPRAZOLE SODIUM 40 MG PO TBEC
40.0000 mg | DELAYED_RELEASE_TABLET | Freq: Every day | ORAL | Status: DC
  Administered 2024-07-02 – 2024-07-05 (×4): 40 mg via ORAL
  Filled 2024-07-01 (×4): qty 1

## 2024-07-01 MED ORDER — FUROSEMIDE 10 MG/ML IJ SOLN
INTRAMUSCULAR | Status: DC | PRN
  Administered 2024-07-01: 60 mg via INTRAVENOUS

## 2024-07-01 MED ORDER — AMLODIPINE BESYLATE 5 MG PO TABS
10.0000 mg | ORAL_TABLET | Freq: Once | ORAL | Status: AC
  Administered 2024-07-01: 10 mg via ORAL
  Filled 2024-07-01: qty 2

## 2024-07-01 MED ORDER — CARVEDILOL 3.125 MG PO TABS
3.1250 mg | ORAL_TABLET | Freq: Two times a day (BID) | ORAL | Status: DC
  Administered 2024-07-02 – 2024-07-05 (×7): 3.125 mg via ORAL
  Filled 2024-07-01 (×7): qty 1

## 2024-07-01 MED ORDER — CARVEDILOL 6.25 MG PO TABS
3.1250 mg | ORAL_TABLET | ORAL | Status: AC
  Administered 2024-07-01: 3.125 mg via ORAL
  Filled 2024-07-01: qty 1

## 2024-07-01 MED ORDER — AMLODIPINE BESYLATE 10 MG PO TABS
10.0000 mg | ORAL_TABLET | Freq: Every day | ORAL | Status: DC
  Administered 2024-07-02 – 2024-07-05 (×4): 10 mg via ORAL
  Filled 2024-07-01 (×5): qty 1

## 2024-07-01 MED ORDER — SENNOSIDES-DOCUSATE SODIUM 8.6-50 MG PO TABS
1.0000 | ORAL_TABLET | Freq: Every evening | ORAL | Status: DC | PRN
Start: 2024-07-01 — End: 2024-07-05

## 2024-07-01 MED ORDER — ATORVASTATIN CALCIUM 20 MG PO TABS
80.0000 mg | ORAL_TABLET | Freq: Every day | ORAL | Status: DC
  Administered 2024-07-02 – 2024-07-05 (×4): 80 mg via ORAL
  Filled 2024-07-01 (×2): qty 1
  Filled 2024-07-01: qty 4
  Filled 2024-07-01: qty 1

## 2024-07-01 MED ORDER — HEPARIN SODIUM (PORCINE) 5000 UNIT/ML IJ SOLN
5000.0000 [IU] | Freq: Three times a day (TID) | INTRAMUSCULAR | Status: DC
  Administered 2024-07-01 (×2): 5000 [IU] via SUBCUTANEOUS
  Filled 2024-07-01 (×2): qty 1

## 2024-07-01 MED ORDER — FUROSEMIDE 10 MG/ML IJ SOLN
60.0000 mg | Freq: Two times a day (BID) | INTRAMUSCULAR | Status: DC
  Administered 2024-07-01 – 2024-07-05 (×8): 60 mg via INTRAVENOUS
  Filled 2024-07-01 (×3): qty 6
  Filled 2024-07-01: qty 8
  Filled 2024-07-01 (×5): qty 6

## 2024-07-01 MED ORDER — VITAMIN B-12 1000 MCG PO TABS
1000.0000 ug | ORAL_TABLET | Freq: Every day | ORAL | Status: DC
  Administered 2024-07-02 – 2024-07-05 (×4): 1000 ug via ORAL
  Filled 2024-07-01 (×4): qty 1

## 2024-07-01 MED ORDER — HEPARIN (PORCINE) IN NACL 1000-0.9 UT/500ML-% IV SOLN
INTRAVENOUS | Status: DC | PRN
  Administered 2024-07-01: 1000 mL

## 2024-07-01 MED ORDER — LIDOCAINE HCL (PF) 1 % IJ SOLN
INTRAMUSCULAR | Status: DC | PRN
  Administered 2024-07-01 (×2): 5 mL

## 2024-07-01 MED ORDER — ISOSORBIDE MONONITRATE ER 30 MG PO TB24
120.0000 mg | ORAL_TABLET | Freq: Every day | ORAL | Status: DC
  Administered 2024-07-02 – 2024-07-05 (×4): 120 mg via ORAL
  Filled 2024-07-01: qty 2
  Filled 2024-07-01: qty 4
  Filled 2024-07-01 (×2): qty 2

## 2024-07-01 MED ORDER — SODIUM CHLORIDE 0.9% FLUSH
3.0000 mL | Freq: Two times a day (BID) | INTRAVENOUS | Status: DC
  Administered 2024-07-01 – 2024-07-05 (×8): 3 mL via INTRAVENOUS

## 2024-07-01 MED ORDER — SODIUM CHLORIDE 0.9% FLUSH: 3.0000 mL | Freq: Two times a day (BID) | INTRAVENOUS | Status: DC

## 2024-07-01 MED ORDER — FUROSEMIDE 10 MG/ML IJ SOLN
INTRAMUSCULAR | Status: AC
Start: 2024-07-01 — End: 2024-07-01
  Filled 2024-07-01: qty 8

## 2024-07-01 MED ORDER — SODIUM CHLORIDE 0.9% FLUSH: 3.0000 mL | INTRAVENOUS | Status: DC | PRN

## 2024-07-01 MED ORDER — IOHEXOL 300 MG/ML  SOLN
INTRAMUSCULAR | Status: DC | PRN
  Administered 2024-07-01: 10 mL

## 2024-07-01 NOTE — Telephone Encounter (Signed)
 Ngs myeloid printed and going to be scanned into chart.

## 2024-07-01 NOTE — ED Notes (Addendum)
 Resumed care from [place] t rn.  Pt alert.  Family with pt.   Pt waiting on admission.

## 2024-07-01 NOTE — Consult Note (Signed)
 [facility] CLINIC CARDIOLOGY CONSULT NOTE       Patient ID: [name] MRN: [id number] DOB/AGE: 1946-07-21 78 y.o.  Admit date: 07/01/2024 Referring Physician Dr. Oneil Budge Primary Physician Pcp, No  Primary Cardiologist Dr. Ammon Reason for Consultation AoCHF, hypoxia  HPI: [name] is a 78 y.o. male  with a past medical history of chronic HFpEF, persistent atrial fibrillation, aortic stenosis s/p TAVR (01/2024), coronary artery disease s/p CABG x3, multiple coronary stents, most recent stent to ostial SVG in-stent restenosis (2021), hypertension, hyperlipidemia, OSA, CKD, diabetes who presented to the ED on 07/01/2024 for hypoxia.  Had RHC this morning and prior to this as well as in recovery afterwards he was quite hypoxic and was unable to be weaned to room air thus he was sent to the ED for evaluation.  Cardiology was consulted for further evaluation.   Patient reports worsening shortness of breath over the last 2 to 3 weeks.  Symptoms persisted despite transfusion last week.  Presented today for outpatient right heart catheterization but was noted to be quite hypoxic and thus was sent to the ED for evaluation.  Workup in the ED notable for creatinine 1.98, potassium 4.6, hemoglobin 8.4, WBC 6.7. Troponins 121, BNP 12167. EKG in the ED AF rate 69 bpm.  He endorses good response to IV Lasix  dose that he was given this morning.  Patient seen and examined this afternoon, resting in ED stretcher with family at bedside.  We discussed his symptoms in further detail.  He endorses gradually worsening shortness of breath over the last 2 to 3 weeks.  This is usually worse with activity.  States that this feels similar to his past episodes of anemia however this time his symptoms did not improve after having a transfusion last Friday.  Endorses some lower extremity edema as well.  Denies any significant dizziness, lightheadedness, syncope.  States that he did have some chest discomfort  prior to his transfusion last week but this is since resolved after receiving blood.  Review of systems complete and found to be negative unless listed above    Past Medical History:  Diagnosis Date   (HFpEF) heart failure with preserved ejection fraction (HCC) 07/25/2016   a.)TTE 07/25/16: EF >55, triv-mild pan regur, mild AS, G2DD; b.)TTE 09/02/16: EF >55, triv TR, mild AS; c.)TTE 11/05/16: EF >55, LAE, triv-mild pan regur, mild AS, G2DD; d.)TTE 01/21/20: EF >55, mod LVH, LAE/RVE, triv-mild AR/PR/TR, mod MR, mild AS, G2DD; e.)TTE 09/24/22: EF>55, sev LVH, sev LAE, RAE, mild PR, mod AR/MR/TR, mild-mod AS, RVSP 74.7; f.)TTE 12/23/22: EF 60-65, LAE, mild-mod MR/TR, mild AS   Abnormal urine 05/01/2023   Anemia    Aortic atherosclerosis    Aortic stenosis 07/25/2016   a.) TTE 07/25/2016: mild (MPG 13.5); b.) TTE 09/02/2016: mild (MPG 16); c.) TTE 11/05/2016: mild (MPG 9); d.) TTE 01/21/2020: mild (MPG 11.7); e.) TTE 09/24/2022: mild-mod (MPG 20.3); f.) TTE 12/23/2022: mild (MPG 15)   Atrial fibrillation (HCC)    a.) CHA2DS2VASc = 6 (age x2, CHF, HTN, vascular disease history, T2DM);  b.) rate/rhythm maintained on oral labetalol ; chronically anticoagulated with apixaban  + clopidogrel    AV block, 1st degree    B12 deficiency    CKD (chronic kidney disease), stage III (HCC)    Colon polyps    Congestive heart failure (HCC) 05/01/2023   Coronary artery disease 08/16/2016   a.) s/p 3v CABG 08/30/2016; b.) s/p PCI 11/26/2016 (DES x 1 oSVG-RCA); c.) s/p PCI 12/17/2016 (DES x  5 --> mLCx x2, dLCx, pLAD, dLAD); c.) s/p PCI 06/09/2020 (DES x1 --> ISR oSVG-PDA)   Cyst of kidney, acquired 07/03/2023   Dyspnea    Gastroesophageal reflux disease 05/01/2023   GERD (gastroesophageal reflux disease)    Heart murmur    Hepatic steatosis    History of 2019 novel coronavirus disease (COVID-19) 09/02/2020   a.) Tx'd with remdesivir    History of blood transfusion    History of GI bleed 09/02/2020   a.) admitted to  [facility] 09/02/2020 - 09/08/2020   History of heart artery stent 11/26/2016   TOTAL of 7 stents: a.) 11/26/2016 --> 3.5 x 22 mm Resolute Integrity oSVG-RCA; b.) 12/17/2016: 3.0 x 12 mm Xience Alpine dLCx, 3.5 x 22 mm Resolute Onyx mLCx, 3.5 x 18 mm Resolute Onyx mLCx, 3.0 x 26 mm Resolute Onyx pLAD, 2.0 x 26 mm Resolute Onyx dLAD; c.) 06/09/2020 --> 2.5 x 18 mm Resolute Onyx ISR oSVG-PDA   History of partial colectomy 08/12/2012   a.) large gastric hyperplastic polyp removal   HLD (hyperlipidemia)    Hypertension    LBBB (left bundle branch block)    Long term current use of anticoagulant    a.) apixaban    Long term current use of antithrombotics/antiplatelets    a.) clopidogrel    Male hypogonadism    a.) on exogenous TRT (1.62% gel)   Mild cardiomegaly    Mild gynecomastia (bilateral)    Nephrolithiasis    OSA (obstructive sleep apnea)    a.) does not utilize nocturnal PAP therapy   Personal history of surgery to heart and great vessels, presenting hazards to health 05/01/2023   Proteinuria, unspecified 08/01/2023   Pulmonary hypertension (HCC) 09/24/2022   a.) TTE 09/24/2022: RVSP 74.7; b.) TTE 12/23/2022: RVSP 31.3   RBBB (right bundle branch block with left anterior fascicular block)    Right inguinal hernia    Right thyroid  nodule    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   a.) LIMA-LAD, SVG-PDA, SVG-OM3   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/01/2023   Type 2 diabetes mellitus treated with insulin  (HCC)    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   Umbilical hernia    Unstable angina (HCC)    Vitamin D  deficiency     Past Surgical History:  Procedure Laterality Date   ANOMALOUS PULMONARY VENOUS RETURN REPAIR, TOTAL  2025   APPENDECTOMY     CARDIAC CATHETERIZATION Left 08/16/2016   Procedure: Left Heart Cath and Coronary Angiography;  Surgeon: Cara JONETTA Lovelace, MD;  Location: [facility] INVASIVE CV LAB;  Service: Cardiovascular;  Laterality: Left;    COLONOSCOPY N/A 09/07/2020   Procedure: COLONOSCOPY;  Surgeon: Janalyn Keene NOVAK, MD;  Location: [facility] ENDOSCOPY;  Service: Endoscopy;  Laterality: N/A;   COLONOSCOPY N/A 10/19/2023   Procedure: COLONOSCOPY;  Surgeon: Jinny Carmine, MD;  Location: [facility] ENDOSCOPY;  Service: Endoscopy;  Laterality: N/A;   COLONOSCOPY WITH PROPOFOL  N/A 05/17/2021   Procedure: COLONOSCOPY WITH PROPOFOL ;  Surgeon: Janalyn Keene NOVAK, MD;  Location: [facility] ENDOSCOPY;  Service: Endoscopy;  Laterality: N/A;   CORONARY ANGIOPLASTY WITH STENT PLACEMENT Left 11/26/2016   Procedure: CORONARY ANGIOPLASTY WITH STENT PLACEMENT; Location: [facility]; Surgeon: Alm Dais, MD   CORONARY ARTERY BYPASS GRAFT N/A 08/30/2016   Procedure: CORONARY ARTERY BYPASS GRAFT; Location: [facility]; Surgeon: Juliene Pouch, MD   CORONARY STENT INTERVENTION N/A 06/09/2020   Procedure: CORONARY STENT INTERVENTION;  Surgeon: Lovelace Cara JONETTA, MD;  Location: [facility] INVASIVE CV LAB;  Service: Cardiovascular;  Laterality: N/A;   CORONARY STENT INTERVENTION Left 12/17/2016   Procedure: CORONARY STENT INTERVENTION; Location: [facility]; Surgeon: Alm Dais, MD   CORONARY STENT INTERVENTION  03/2024   ENDOSCOPIC MUCOSAL RESECTION N/A 09/22/2020   Procedure: ENDOSCOPIC MUCOSAL RESECTION;  Surgeon: [name] Aloha Raddle., MD;  Location: [facility] ENDOSCOPY;  Service: Gastroenterology;  Laterality: N/A;   ESOPHAGOGASTRODUODENOSCOPY N/A 09/07/2020   Procedure: ESOPHAGOGASTRODUODENOSCOPY (EGD);  Surgeon: Janalyn Keene NOVAK, MD;  Location: [facility] ENDOSCOPY;  Service: Endoscopy;  Laterality: N/A;   ESOPHAGOGASTRODUODENOSCOPY N/A 10/19/2023   Procedure: EGD (ESOPHAGOGASTRODUODENOSCOPY);  Surgeon: Jinny Carmine, MD;  Location: [facility] ENDOSCOPY;  Service: Endoscopy;  Laterality: N/A;   ESOPHAGOGASTRODUODENOSCOPY (EGD) WITH PROPOFOL  N/A 09/22/2020   Procedure: ESOPHAGOGASTRODUODENOSCOPY (EGD) WITH PROPOFOL ;  Surgeon: [name] Aloha Raddle., MD;  Location: [facility] ENDOSCOPY;  Service:  Gastroenterology;  Laterality: N/A;   ESOPHAGOGASTRODUODENOSCOPY (EGD) WITH PROPOFOL  N/A 08/26/2023   Procedure: ESOPHAGOGASTRODUODENOSCOPY (EGD) WITH PROPOFOL ;  Surgeon: Jinny Carmine, MD;  Location: [facility] ENDOSCOPY;  Service: Endoscopy;  Laterality: N/A;   FRACTURE SURGERY     HEMOSTASIS CLIP PLACEMENT  09/22/2020   Procedure: HEMOSTASIS CLIP PLACEMENT;  Surgeon: [name] Aloha Raddle., MD;  Location: [facility] ENDOSCOPY;  Service: Gastroenterology;;   HEMOSTASIS CONTROL  09/22/2020   Procedure: HEMOSTASIS CONTROL;  Surgeon: [name] Aloha Raddle., MD;  Location: [facility] ENDOSCOPY;  Service: Gastroenterology;;   INSERTION OF MESH  02/19/2023   Procedure: INSERTION OF MESH;  Surgeon: Desiderio Schanz, MD;  Location: [facility] ORS;  Service: General;;  umbilical   LAPAROSCOPIC PARTIAL RIGHT COLECTOMY Right 08/12/2012   Procedure: LAPAROSCOPIC PARTIAL RIGHT COLECTOMY; Location: [facility]; Surgeon: Unknown Sharps, MD   LEFT HEART CATH AND CORONARY ANGIOGRAPHY Left 06/09/2020   Procedure: LEFT HEART CATH AND CORONARY ANGIOGRAPHY;  Surgeon: Florencio Cara BIRCH, MD;  Location: [facility] INV[facility]IVE CV LAB;  Service: Cardiovascular;  Laterality: Left;   LEFT HEART CATH AND CORS/GRAFTS ANGIOGRAPHY Left 11/20/2016   Procedure: Left Heart Cath and Cors/Grafts Angiography;  Surgeon: Cara BIRCH Florencio, MD;  Location: [facility] CV LAB;  Service: Cardiovascular;  Laterality: Left;   POLYPECTOMY  09/22/2020   Procedure: POLYPECTOMY;  Surgeon: [name] Aloha Raddle., MD;  Location: [facility] ENDOSCOPY;  Service: Gastroenterology;;   POLYPECTOMY  10/19/2023   Procedure: POLYPECTOMY;  Surgeon: Jinny Carmine, MD;  Location: [facility] ENDOSCOPY;  Service: Endoscopy;;   RIGHT HEART CATH Right 07/01/2024   Procedure: RIGHT HEART CATH;  Surgeon: Ammon Blunt, MD;  Location: [facility]IVE CV LAB;  Service: Cardiovascular;  Laterality: Right;   SUBMUCOSAL LIFTING INJECTION  09/22/2020   Procedure: SUBMUCOSAL LIFTING INJECTION;  Surgeon: [name]  Aloha Raddle., MD;  Location: [facility] ENDOSCOPY;  Service: Gastroenterology;;   UMBILICAL HERNIA REPAIR N/A 02/19/2023   Procedure: HERNIA REPAIR UMBILICAL ADULT, open;  Surgeon: Desiderio Schanz, MD;  Location: [facility] ORS;  Service: General;  Laterality: N/A;   XI ROBOTIC ASSISTED INGUINAL HERNIA REPAIR WITH MESH Right 02/19/2023   Procedure: XI ROBOTIC ASSISTED INGUINAL HERNIA REPAIR WITH MESH;  Surgeon: Desiderio Schanz, MD;  Location: [facility] ORS;  Service: General;  Laterality: Right;    (Not in a hospital admission)  Social History   Socioeconomic History   Marital status: Married    Spouse name: Not on file   Number of children: Not on file   Years of education: Not on file   Highest education level: Not on file  Occupational History   Not on file  Tobacco Use   Smoking status: Former    Current packs/day: 0.00    Types: Cigarettes    Quit date:  2000    Years since quitting: 25.9    Passive exposure: Never   Smokeless tobacco: Never  Vaping Use   Vaping status: Never Used  Substance and Sexual Activity   Alcohol use: No   Drug use: No   Sexual activity: Not Currently  Other Topics Concern   Not on file  Social History Narrative   Not on file   Social Drivers of Health   Financial Resource Strain: Low Risk  (05/08/2024)   Received from [facility]   Overall Financial Resource Strain (CARDIA)    Difficulty of Paying Living Expenses: Not hard at all  Food Insecurity: No Food Insecurity (05/08/2024)   Received from [facility]   Hunger Vital Sign    Within the past 12 months, you worried that your food would run out before you got the money to buy more.: Never true    Within the past 12 months, the food you bought just didn't last and you didn't have money to get more.: Never true  Transportation Needs: No Transportation Needs (05/08/2024)   Received from [facility] - Transportation    In the past 12 months, has lack of  transportation kept you from medical appointments or from getting medications?: No    Lack of Transportation (Non-Medical): No  Physical Activity: Not on file  Stress: Not on file  Social Connections: Patient Declined (03/02/2024)   Social Connection and Isolation Panel    Frequency of Communication with Friends and Family: Patient declined    Frequency of Social Gatherings with Friends and Family: Patient declined    Attends Religious Services: Patient declined    Active Member of Clubs or Organizations: Patient declined    Attends Banker Meetings: Patient declined    Marital Status: Patient declined  Intimate Partner Violence: Not At Risk (03/02/2024)   Humiliation, Afraid, Rape, and Kick questionnaire    Fear of Current or Ex-Partner: No    Emotionally Abused: No    Physically Abused: No    Sexually Abused: No    Family History  Problem Relation Age of Onset   Hyperlipidemia Mother    Heart disease Mother    Hypertension Father      Vitals:   07/01/24 1218 07/01/24 1219 07/01/24 1408 07/01/24 1451  BP: (!) 181/77  (!) 179/63 (!) 173/71  Pulse: 72  (!) 58 73  Resp: 18  13   Temp: 98 F (36.7 C)     TempSrc: Oral     SpO2: 100%  100%   Weight:  90.3 kg    Height:  6' (1.829 m)      PHYSICAL EXAM General: Chronically ill-appearing male, well nourished, in no acute distress. HEENT: Normocephalic and atraumatic. Neck: No JVD.  Lungs: Normal respiratory effort on 3L [place].  Bibasilar crackles. Heart: Irregularly irregular, controlled rate. Normal S1 and S2 without gallops. + Murmur Abdomen: Non-distended appearing.  Msk: Normal strength and tone for age. Extremities: Warm and well perfused. No clubbing, cyanosis.  1+ pitting edema.  Neuro: Alert and oriented X 3. Psych: Answers questions appropriately.   Labs: Basic Metabolic Panel: Recent Labs    07/01/24 0809 07/01/24 1220  NA 136 135  K 4.3 4.6  CL  --  98  CO2  --  24  GLUCOSE  --  158*  BUN  --   30*  CREATININE  --  1.98*  CALCIUM   --  10.5*  Liver Function Tests: No results for input(s): AST, ALT, ALKPHOS, BILITOT, PROT, ALBUMIN in the last 72 hours. No results for input(s): LIPASE, AMYLASE in the last 72 hours. CBC: Recent Labs    07/01/24 0711 07/01/24 0809 07/01/24 1220  WBC 8.7  --  6.7  HGB 8.5* 8.2* 8.4*  HCT 27.1* 24.0* 27.0*  MCV 98.2  --  98.5  PLT 181  --  156   Cardiac Enzymes: No results for input(s): CKTOTAL, CKMB, CKMBINDEX, TROPONINIHS in the last 72 hours. BNP: No results for input(s): BNP in the last 72 hours. D-Dimer: No results for input(s): DDIMER in the last 72 hours. Hemoglobin A1C: No results for input(s): HGBA1C in the last 72 hours. Fasting Lipid Panel: No results for input(s): CHOL, HDL, LDLCALC, TRIG, CHOLHDL, LDLDIRECT in the last 72 hours. Thyroid  Function Tests: No results for input(s): TSH, T4TOTAL, T3FREE, THYROIDAB in the last 72 hours.  Invalid input(s): FREET3 Anemia Panel: No results for input(s): VITAMINB12, FOLATE, FERRITIN, TIBC, IRON , RETICCTPCT in the last 72 hours.   Radiology: DG Chest 2 View Result Date: 07/01/2024 CLINICAL DATA:  Shortness of breath. EXAM: CHEST - 2 VIEW COMPARISON:  03/03/2024. FINDINGS: Cardiomegaly, unchanged. Prior valve replacement and CABG. Median sternotomy. Aortic atherosclerosis. Moderate right and small left pleural effusions with bibasilar opacities, likely reflecting atelectasis. No pneumothorax. No acute osseous abnormality. IMPRESSION: 1. Moderate right and small left pleural effusions with bibasilar atelectasis. 2. Cardiomegaly. Electronically Signed   By: Harrietta Sherry M.D.   On: 07/01/2024 13:26   CARDIAC CATHETERIZATION Result Date: 07/01/2024   Hemodynamic findings consistent with severe pulmonary hypertension. 1.   RA 15/15 mmHg  RV 87/21  PA 86/42  (51)  PCWP 45/24  (32) 2.  Severe pulmonary hypertension    CT  BONE MARROW BIOPSY & ASPIRATION Result Date: 06/15/2024 CLINICAL DATA:  Refractory edema related to chronic kidney disease and need for bone marrow biopsy. EXAM: CT GUIDED BONE MARROW ASPIRATION AND BIOPSY ANESTHESIA/SEDATION: Moderate (conscious) sedation was employed during this procedure. A total of Versed  2.0 mg and Fentanyl  100 mcg was administered intravenously. Moderate Sedation Time: 12 minutes. The patient's level of consciousness and vital signs were monitored continuously by radiology nursing throughout the procedure under my direct supervision. PROCEDURE: The procedure risks, benefits, and alternatives were explained to the patient. Questions regarding the procedure were encouraged and answered. The patient understands and consents to the procedure. A time out was performed prior to initiating the procedure. The right gluteal region was prepped with chlorhexidine . Sterile gown and sterile gloves were used for the procedure. Local anesthesia was provided with 1% Lidocaine . Under CT guidance, an 11 gauge On Control bone cutting needle was advanced from a posterior approach into the right iliac bone. Needle positioning was confirmed with CT. Initial non heparinized and heparinized aspirate samples were obtained of bone marrow. Core biopsy was performed via the On Control drill needle. COMPLICATIONS: None FINDINGS: Inspection of initial aspirate did reveal visible particles. Intact core biopsy sample was obtained. IMPRESSION: CT guided bone marrow biopsy of right posterior iliac bone with both aspirate and core samples obtained. Electronically Signed   By: Marcey Moan M.D.   On: 06/15/2024 11:03    ECHO 03/2024 [facility]: NORMAL LEFT VENTRICULAR SYSTOLIC FUNCTION WITH SEVERE LVH  ESTIMATED EF: >55%, CALC EF(3D): 55%  ELEVATED LA PRESSURES WITH DIASTOLIC DYSFUNCTION (GRADE 3)  NORMAL RIGHT VENTRICULAR SYSTOLIC FUNCTION  VALVULAR REGURGITATION: MILD AR, MILD MR, TRIVIAL PR, MILD TR  ESTIMATED RVSP: 85  mmHg (ABNORMAL)  VALVULAR STENOSIS: TRANSCATHETER BIOPROSTHETIC, No MS, No PS, No TS   TELEMETRY (personally reviewed): Atrial fibrillation rate 60-70s  EKG (personally reviewed): AF rate 69 bpm  Data reviewed by me 07/01/2024: last 24h vitals tele labs imaging I/O ED provider note, admission H&P  Active Problems:   * No active hospital problems. *    ASSESSMENT AND PLAN:  [name] is a 78 y.o. male  with a past medical history of chronic HFpEF, persistent atrial fibrillation, aortic stenosis s/p TAVR (01/2024), coronary artery disease s/p CABG x3, multiple coronary stents, most recent stent to ostial SVG in-stent restenosis (2021), hypertension, hyperlipidemia, OSA, CKD, diabetes who presented to the ED on 07/01/2024 for hypoxia.  Had RHC this morning and prior to this as well as in recovery afterwards he was quite hypoxic and was unable to be weaned to room air thus he was sent to the ED for evaluation.  Cardiology was consulted for further evaluation.   # Acute hypoxic respiratory failure # Acute on chronic HFpEF # Coronary artery disease s/p CABG # Demand ischemia # Aortic stenosis s/p TAVR 01/2024 Patient presented for outpatient RHC, persistently hypoxic postprocedure and sent to the ED for evaluation.  BNP elevated at 12,000.  Initial troponin 121, he is without chest pain.  Chest x-ray with pleural effusions. - Will reorder IV Lasix  at 60 mg twice daily.  Had good response with dose this a.m. - Reorder home atorvastatin  80 mg daily, aspirin  81 mg daily, Plavix  75 mg daily. - Reorder home Imdur  120 mg daily.  Continue home amlodipine  10 mg daily, carvedilol  3.125 mg twice daily.  Will wait to resume home Jardiance  pending renal function with IV diuresis. -Mild troponin elevation most consistent with demand/supply mismatch and not ACS.  - Patient with history of recurrent pleural effusions, may require thoracentesis during admission.   This patient's plan of care was  discussed and created with Dr. Florencio and he is in agreement.  Signed: Danita Bloch, PA-C  07/01/2024, 3:08 PM [facility]

## 2024-07-01 NOTE — Discharge Instructions (Signed)
Right Heart Cath, Care After This sheet gives you information about how to care for yourself after your procedure. Your health care provider may also give you more specific instructions. If you have problems or questions, contact your health care provider. What can I expect after the procedure? After the procedure, it is common to have: Bruising or mild discomfort in the area where the IV was inserted (insertion site). Follow these instructions at home: Eating and drinking  You may eat and drink after your procedure.  Drink a lot of fluids for the first several days after the procedure, as directed by your health care provider. This helps to wash (flush) the contrast out of your body. Examples of healthy fluids include water or low-calorie drinks. General instructions Check your IV insertion area and also your venous access site every day for signs of infection. Check for: Redness, swelling, or pain. Fluid or blood. Warmth. Pus or a bad smell. Take over-the-counter and prescription medicines only as told by your health care provider. Rest and return to your normal activities as told by your health care provider. Ask your health care provider what activities are safe for you. Do not drive for 24 hours if you were given a medicine to help you relax (sedative), or until your health care provider approves. Keep all follow-up visits as told by your health care provider. This is important. Contact a health care provider if: Your skin becomes itchy or you develop a rash or hives. You have a fever that does not get better with medicine. You feel nauseous. You vomit. You have redness, swelling, or pain around the insertion site. You have fluid or blood coming from the insertion site. Your insertion area feels warm to the touch. You have pus or a bad smell coming from the insertion site. Get help right away if: You have difficulty breathing or shortness of breath. You develop chest pain. You  faint. You feel very dizzy. These symptoms may represent a serious problem that is an emergency. Do not wait to see if the symptoms will go away. Get medical help right away. Call your local emergency services (911 in the [place] Do not drive yourself to the hospital. Summary After your procedure, it is common to have bruising or mild discomfort in the area where the IV was inserted. You should check your IV insertion area every day for signs of infection. Take over-the-counter and prescription medicines only as told by your health care provider. You should drink a lot of fluids for the first several days after the procedure to help flush the contrast from your body. This information is not intended to replace advice given to you by your health care provider. Make sure you discuss any questions you have with your health care provider. Document Released: 05/20/2013 Document Revised: 07/12/2017 Document Reviewed: 06/23/2016 Elsevier Patient Education  2020 [employer] 

## 2024-07-01 NOTE — Telephone Encounter (Signed)
 Patient called to cancel appointments for tomorrow and Friday due to being hospitalized. He wanted to let Dr. Melanee know.

## 2024-07-01 NOTE — ED Notes (Addendum)
 Pt brought over from [facility]. Pt just had right heart cath done today. Pt still having sob. Per RN MD Paraschos wanted pt brought over to ED for further evaluation. Pt has IV in left hand.

## 2024-07-01 NOTE — ED Provider Notes (Signed)
 [facility] Provider Note    Event Date/Time   First MD Initiated Contact with Patient 07/01/24 1358     (approximate)   History   Shortness of Breath   HPI  [name] is a 78 y.o. male shortness of breath after cardiac catheterization.  Patient had cardiac catheterization today, done in preparation for Watch[facility].  He advises that he is actually at shortness of breath slowly worsening for the last several days, when he laid down on the catheterization table he felt to be quite short of breath.  After the procedure per Dr. Ammon of cardiology he reports patient was hypoxic with saturations mid 80s, they gave a dose of IV Lasix  as he appeared to be volume overloaded but persisted with hypoxia thus transferred to the ED  He has no pain no fevers no cough.  He feels like he has too much fluid on him, and feels like he is probably having fluid in his lungs.     Physical Exam   Triage Vital Signs: ED Triage Vitals  Encounter Vitals Group     BP 07/01/24 1218 (!) 181/77     Girls Systolic BP Percentile --      Girls Diastolic BP Percentile --      Boys Systolic BP Percentile --      Boys Diastolic BP Percentile --      Pulse Rate 07/01/24 1218 72     Resp 07/01/24 1218 18     Temp 07/01/24 1218 98 F (36.7 C)     Temp Source 07/01/24 1218 Oral     SpO2 07/01/24 1218 100 %     Weight 07/01/24 1219 199 lb (90.3 kg)     Height 07/01/24 1219 6' (1.829 m)     Head Circumference --      Peak Flow --      Pain Score 07/01/24 1218 0     Pain Loc --      Pain Education --      Exclude from Growth Chart --     Most recent vital signs: Vitals:   07/01/24 1535 07/01/24 1537  BP: (!) 168/69   Pulse: 68   Resp: (!) 23   Temp:  98 F (36.7 C)  SpO2: 98%      General: Awake, no distress.  CV:  Good peripheral perfusion.  Normal tones and rate Resp:  Normal effort.  Clear bilateral although diminished in the bases bilaterally.  No frank  rales Abd:  No distention.  Other:  No lower extremity venous cords or congestion.  Mild bilateral lower extremity edema patient reports this is intermittent for quite some time no acute change   ED Results / Procedures / Treatments   Labs (all labs ordered are listed, but only abnormal results are displayed) Labs Reviewed  BASIC METABOLIC PANEL WITH GFR - Abnormal; Notable for the following components:      Result Value   Glucose, Bld 158 (*)    BUN 30 (*)    Creatinine, Ser 1.98 (*)    Calcium  10.5 (*)    GFR, Estimated 34 (*)    All other components within normal limits  CBC - Abnormal; Notable for the following components:   RBC 2.74 (*)    Hemoglobin 8.4 (*)    HCT 27.0 (*)    RDW 20.6 (*)    All other components within normal limits  PRO BRAIN NATRIURETIC PEPTIDE - Abnormal; Notable for the following  components:   Pro Brain Natriuretic Peptide 12,167.0 (*)    All other components within normal limits  TROPONIN T, HIGH SENSITIVITY - Abnormal; Notable for the following components:   Troponin T High Sensitivity 121 (*)    All other components within normal limits  MAGNESIUM   TROPONIN T, HIGH SENSITIVITY     EKG  Interpreted by me at 1220 A-fib right bundle branch block.  No evidence of frank ischemia   RADIOLOGY  Chest x-ray inter by me as s at least 1 right-sided moderate and left-sided bilateral pleural effusions  DG Chest 2 View Result Date: 07/01/2024 CLINICAL DATA:  Shortness of breath. EXAM: CHEST - 2 VIEW COMPARISON:  03/03/2024. FINDINGS: Cardiomegaly, unchanged. Prior valve replacement and CABG. Median sternotomy. Aortic atherosclerosis. Moderate right and small left pleural effusions with bibasilar opacities, likely reflecting atelectasis. No pneumothorax. No acute osseous abnormality. IMPRESSION: 1. Moderate right and small left pleural effusions with bibasilar atelectasis. 2. Cardiomegaly. Electronically Signed   By: Harrietta Sherry M.D.   On: 07/01/2024  13:26   PROCEDURES:  Critical Care performed: No  Procedures   MEDICATIONS ORDERED IN ED: Medications  furosemide  (LASIX ) injection 60 mg (has no administration in time range)  aspirin  EC tablet 81 mg (has no administration in time range)  atorvastatin  (LIPITOR ) tablet 80 mg (has no administration in time range)  clopidogrel  (PLAVIX ) tablet 75 mg (has no administration in time range)  isosorbide  mononitrate (IMDUR ) 24 hr tablet 120 mg (has no administration in time range)  sodium chloride  flush (NS) 0.9 % injection 3 mL (has no administration in time range)  acetaminophen  (TYLENOL ) tablet 650 mg (has no administration in time range)    Or  acetaminophen  (TYLENOL ) suppository 650 mg (has no administration in time range)  senna-docusate (Senokot-S) tablet 1 tablet (has no administration in time range)  heparin  injection 5,000 Units (has no administration in time range)  amLODipine  (NORVASC ) tablet 10 mg (10 mg Oral Given 07/01/24 1451)  carvedilol  (COREG ) tablet 3.125 mg (3.125 mg Oral Given 07/01/24 1451)     IMPRESSION / MDM / ASSESSMENT AND PLAN / ED COURSE  I reviewed the triage vital signs and the nursing notes.                              Differential diagnosis includes, but is not limited to, volume overload, other considerations such as elevated right heart pressure, pulmonary hypertension, CHF, pneumonia infection etc. strongly considered.  He has no associated chest pain troponin minimally elevated but in the setting of a highly elevated BNP with bilateral pleural effusions no obvious infectious symptoms no fever no white count.  Discussed with cardiology, he does have oxygen saturation about 88 to 89% when I speak to him and he speaking back to me on room air, placed back on 2 L nasal cannula.  Oxygen deficit is new and cardiology advising they have given him a dose of Lasix , they asked that we not provide any further diuresis at this point rather they will see him and  provide more formal consultation this afternoon with plan for admission.  I think this is agreeable.  Patient very much agreeable.  He is in no distress but at the same time certainly has not undergo new oxygen requirement and evidence of volume overload.  He has no chest pain no associated findings would be highly suggestive of thromboembolism.  Further care and treatment of the hospitalist service discussed  with admitting doctor Fernand  Patient's presentation is most consistent with acute complicated illness / injury requiring diagnostic workup.   The patient is on the cardiac monitor to evaluate for evidence of arrhythmia and/or significant heart rate changes.       FINAL CLINICAL IMPRESSION(S) / ED DIAGNOSES   Final diagnoses:  Dyspnea, unspecified type  Acute on chronic congestive heart failure, unspecified heart failure type (HCC)     Rx / DC Orders   ED Discharge Orders     None        Note:  This document was prepared using Dragon voice recognition software and may include unintentional dictation errors.   Dicky Anes, MD 07/01/24 [phone]

## 2024-07-01 NOTE — Progress Notes (Signed)
 Patient came in on room air with O2 sats 88-91%. Post procedure his sats were 84-86% on room air. Patient was put on 3L of O2 with sats 94% and weaned down to 1L. On room air he's still about 85%. Dr.Parachos notified and wants him to be sent to the ED.

## 2024-07-01 NOTE — ED Notes (Signed)
 Called CCMD for central monitoring at this time

## 2024-07-01 NOTE — ED Notes (Signed)
 Lab called and reports adding BNP and troponin to previous collected labs.

## 2024-07-01 NOTE — ED Triage Notes (Signed)
 Patient states shortness of breath for several days; had heart cath prior to arrival. Per cath RN, patient to come to ED for further evaluation. Patient states he had a blood transfusion last week.

## 2024-07-01 NOTE — H&P (Signed)
 History and Physical    [name]:[id number] DOB: 1945-12-07 DOA: 07/01/2024  DOS: the patient was seen and examined on 07/01/2024  PCP: Pcp, No   Patient coming from: Cath lab  I have personally briefly reviewed patient's old medical records in [facility] and CareEverywhere  HPI:   [name] is a 78 y.o. year old male with medical history of hypertension, hyperlipidemia, type 2 diabetes, CAD status post CABG, CHF, paroxysmal A-fib, OSA presenting from Cath Lab given he was hypoxic post cath.   Pt states he has been having worsening dyspnea for the last 2 to 3 weeks.  He had low blood counts and was given blood but this did not help his dyspnea.  Patient without any infectious symptoms including coughing, sore throat, myalgias, fevers or chills.   On arrival to the ED patient was noted to be HDS stable.  Labs and imaging obtained.  CBC without any leukocytosis, anemia that is at baseline around 8.4.  BMP without significant electrolyte abnormalities but elevated creatinine at CKD 3B level.  Troponin and BNP pending.  Chest x-ray ordered and shows cardiomegaly with bilateral pleural effusions.  Given patient's came from Cath Lab, cardiology consulted by EDP who will evaluate the patient.  Given the need for continued care, [facility] contacted for admission.  Review of Systems: As mentioned in the history of present illness. All other systems reviewed and are negative.   Past Medical History:  Diagnosis Date   (HFpEF) heart failure with preserved ejection fraction (HCC) 07/25/2016   a.)TTE 07/25/16: EF >55, triv-mild pan regur, mild AS, G2DD; b.)TTE 09/02/16: EF >55, triv TR, mild AS; c.)TTE 11/05/16: EF >55, LAE, triv-mild pan regur, mild AS, G2DD; d.)TTE 01/21/20: EF >55, mod LVH, LAE/RVE, triv-mild AR/PR/TR, mod MR, mild AS, G2DD; e.)TTE 09/24/22: EF>55, sev LVH, sev LAE, RAE, mild PR, mod AR/MR/TR, mild-mod AS, RVSP 74.7; f.)TTE 12/23/22: EF 60-65, LAE, mild-mod MR/TR, mild  AS   Abnormal urine 05/01/2023   Anemia    Aortic atherosclerosis    Aortic stenosis 07/25/2016   a.) TTE 07/25/2016: mild (MPG 13.5); b.) TTE 09/02/2016: mild (MPG 16); c.) TTE 11/05/2016: mild (MPG 9); d.) TTE 01/21/2020: mild (MPG 11.7); e.) TTE 09/24/2022: mild-mod (MPG 20.3); f.) TTE 12/23/2022: mild (MPG 15)   Atrial fibrillation (HCC)    a.) CHA2DS2VASc = 6 (age x2, CHF, HTN, vascular disease history, T2DM);  b.) rate/rhythm maintained on oral labetalol ; chronically anticoagulated with apixaban  + clopidogrel    AV block, 1st degree    B12 deficiency    CKD (chronic kidney disease), stage III (HCC)    Colon polyps    Congestive heart failure (HCC) 05/01/2023   Coronary artery disease 08/16/2016   a.) s/p 3v CABG 08/30/2016; b.) s/p PCI 11/26/2016 (DES x 1 oSVG-RCA); c.) s/p PCI 12/17/2016 (DES x 5 --> mLCx x2, dLCx, pLAD, dLAD); c.) s/p PCI 06/09/2020 (DES x1 --> ISR oSVG-PDA)   Cyst of kidney, acquired 07/03/2023   Dyspnea    Gastroesophageal reflux disease 05/01/2023   GERD (gastroesophageal reflux disease)    Heart murmur    Hepatic steatosis    History of 2019 novel coronavirus disease (COVID-19) 09/02/2020   a.) Tx'd with remdesivir    History of blood transfusion    History of GI bleed 09/02/2020   a.) admitted to [facility] 09/02/2020 - 09/08/2020   History of heart artery stent 11/26/2016   TOTAL of 7 stents: a.) 11/26/2016 --> 3.5 x 22 mm Resolute Integrity oSVG-RCA;  b.) 12/17/2016: 3.0 x 12 mm Xience Alpine dLCx, 3.5 x 22 mm Resolute Onyx mLCx, 3.5 x 18 mm Resolute Onyx mLCx, 3.0 x 26 mm Resolute Onyx pLAD, 2.0 x 26 mm Resolute Onyx dLAD; c.) 06/09/2020 --> 2.5 x 18 mm Resolute Onyx ISR oSVG-PDA   History of partial colectomy 08/12/2012   a.) large gastric hyperplastic polyp removal   HLD (hyperlipidemia)    Hypertension    LBBB (left bundle branch block)    Long term current use of anticoagulant    a.) apixaban    Long term current use of antithrombotics/antiplatelets     a.) clopidogrel    Male hypogonadism    a.) on exogenous TRT (1.62% gel)   Mild cardiomegaly    Mild gynecomastia (bilateral)    Nephrolithiasis    OSA (obstructive sleep apnea)    a.) does not utilize nocturnal PAP therapy   Personal history of surgery to heart and great vessels, presenting hazards to health 05/01/2023   Proteinuria, unspecified 08/01/2023   Pulmonary hypertension (HCC) 09/24/2022   a.) TTE 09/24/2022: RVSP 74.7; b.) TTE 12/23/2022: RVSP 31.3   RBBB (right bundle branch block with left anterior fascicular block)    Right inguinal hernia    Right thyroid  nodule    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   a.) LIMA-LAD, SVG-PDA, SVG-OM3   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/01/2023   Type 2 diabetes mellitus treated with insulin  (HCC)    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   Umbilical hernia    Unstable angina (HCC)    Vitamin D  deficiency     Past Surgical History:  Procedure Laterality Date   ANOMALOUS PULMONARY VENOUS RETURN REPAIR, TOTAL  2025   APPENDECTOMY     CARDIAC CATHETERIZATION Left 08/16/2016   Procedure: Left Heart Cath and Coronary Angiography;  Surgeon: Cara JONETTA Lovelace, MD;  Location: [facility] INVASIVE CV LAB;  Service: Cardiovascular;  Laterality: Left;   COLONOSCOPY N/A 09/07/2020   Procedure: COLONOSCOPY;  Surgeon: Janalyn Keene NOVAK, MD;  Location: [facility] ENDOSCOPY;  Service: Endoscopy;  Laterality: N/A;   COLONOSCOPY N/A 10/19/2023   Procedure: COLONOSCOPY;  Surgeon: Jinny Carmine, MD;  Location: [facility] ENDOSCOPY;  Service: Endoscopy;  Laterality: N/A;   COLONOSCOPY WITH PROPOFOL  N/A 05/17/2021   Procedure: COLONOSCOPY WITH PROPOFOL ;  Surgeon: Janalyn Keene NOVAK, MD;  Location: [facility] ENDOSCOPY;  Service: Endoscopy;  Laterality: N/A;   CORONARY ANGIOPLASTY WITH STENT PLACEMENT Left 11/26/2016   Procedure: CORONARY ANGIOPLASTY WITH STENT PLACEMENT; Location: [facility]; Surgeon: Alm Dais, MD   CORONARY ARTERY BYPASS  GRAFT N/A 08/30/2016   Procedure: CORONARY ARTERY BYPASS GRAFT; Location: [facility]; Surgeon: Juliene Pouch, MD   CORONARY STENT INTERVENTION N/A 06/09/2020   Procedure: CORONARY STENT INTERVENTION;  Surgeon: Lovelace Cara JONETTA, MD;  Location: [facility];  Service: Cardiovascular;  Laterality: N/A;   CORONARY STENT INTERVENTION Left 12/17/2016   Procedure: CORONARY STENT INTERVENTION; Location: [facility]; Surgeon: Alm Dais, MD   CORONARY STENT INTERVENTION  03/2024   ENDOSCOPIC MUCOSAL RESECTION N/A 09/22/2020   Procedure: ENDOSCOPIC MUCOSAL RESECTION;  Surgeon: [name] Aloha Raddle., MD;  Location: [facility] ENDOSCOPY;  Service: Gastroenterology;  Laterality: N/A;   ESOPHAGOGASTRODUODENOSCOPY N/A 09/07/2020   Procedure: ESOPHAGOGASTRODUODENOSCOPY (EGD);  Surgeon: Janalyn Keene NOVAK, MD;  Location: [facility] ENDOSCOPY;  Service: Endoscopy;  Laterality: N/A;   ESOPHAGOGASTRODUODENOSCOPY N/A 10/19/2023   Procedure: EGD (ESOPHAGOGASTRODUODENOSCOPY);  Surgeon: Jinny Carmine, MD;  Location: [facility] ENDOSCOPY;  Service: Endoscopy;  Laterality: N/A;   ESOPHAGOGASTRODUODENOSCOPY (EGD)  WITH PROPOFOL  N/A 09/22/2020   Procedure: ESOPHAGOGASTRODUODENOSCOPY (EGD) WITH PROPOFOL ;  Surgeon: [name] Aloha Raddle., MD;  Location: [facility] ENDOSCOPY;  Service: Gastroenterology;  Laterality: N/A;   ESOPHAGOGASTRODUODENOSCOPY (EGD) WITH PROPOFOL  N/A 08/26/2023   Procedure: ESOPHAGOGASTRODUODENOSCOPY (EGD) WITH PROPOFOL ;  Surgeon: Jinny Carmine, MD;  Location: [facility] ENDOSCOPY;  Service: Endoscopy;  Laterality: N/A;   FRACTURE SURGERY     HEMOSTASIS CLIP PLACEMENT  09/22/2020   Procedure: HEMOSTASIS CLIP PLACEMENT;  Surgeon: [name] Aloha Raddle., MD;  Location: [facility] ENDOSCOPY;  Service: Gastroenterology;;   HEMOSTASIS CONTROL  09/22/2020   Procedure: HEMOSTASIS CONTROL;  Surgeon: [name] Aloha Raddle., MD;  Location:  [facility] ENDOSCOPY;  Service: Gastroenterology;;   INSERTION OF MESH  02/19/2023   Procedure: INSERTION OF MESH;   Surgeon: Desiderio Schanz, MD;  Location: [facility] ORS;  Service: General;;  umbilical   LAPAROSCOPIC PARTIAL RIGHT COLECTOMY Right 08/12/2012   Procedure: LAPAROSCOPIC PARTIAL RIGHT COLECTOMY; Location: [facility]; Surgeon: Unknown Sharps, MD   LEFT HEART CATH AND CORONARY ANGIOGRAPHY Left 06/09/2020   Procedure: LEFT HEART CATH AND CORONARY ANGIOGRAPHY;  Surgeon: Florencio Cara BIRCH, MD;  Location: [facility];  Service: Cardiovascular;  Laterality: Left;   LEFT HEART CATH AND CORS/GRAFTS ANGIOGRAPHY Left 11/20/2016   Procedure: Left Heart Cath and Cors/Grafts Angiography;  Surgeon: Cara BIRCH Florencio, MD;  Location: [facility];  Service: Cardiovascular;  Laterality: Left;   POLYPECTOMY  09/22/2020   Procedure: POLYPECTOMY;  Surgeon: [name] Aloha Raddle., MD;  Location: [facility] ENDOSCOPY;  Service: Gastroenterology;;   POLYPECTOMY  10/19/2023   Procedure: POLYPECTOMY;  Surgeon: Jinny Carmine, MD;  Location: [facility] ENDOSCOPY;  Service: Endoscopy;;   RIGHT HEART CATH Right 07/01/2024   Procedure: RIGHT HEART CATH;  Surgeon: Ammon Blunt, MD;  Location: [facility];  Service: Cardiovascular;  Laterality: Right;   SUBMUCOSAL LIFTING INJECTION  09/22/2020   Procedure: SUBMUCOSAL LIFTING INJECTION;  Surgeon: [name] Aloha Raddle., MD;  Location: [facility] ENDOSCOPY;  Service: Gastroenterology;;   UMBILICAL HERNIA REPAIR N/A 02/19/2023   Procedure: HERNIA REPAIR UMBILICAL ADULT, open;  Surgeon: Desiderio Schanz, MD;  Location: [facility] ORS;  Service: General;  Laterality: N/A;   XI ROBOTIC ASSISTED INGUINAL HERNIA REPAIR WITH MESH Right 02/19/2023   Procedure: XI ROBOTIC ASSISTED INGUINAL HERNIA REPAIR WITH MESH;  Surgeon: Desiderio Schanz, MD;  Location: [facility] ORS;  Service: General;  Laterality: Right;     No Known Allergies  Family History  Problem Relation Age of Onset   Hyperlipidemia Mother    Heart disease Mother    Hypertension Father     Prior to Admission medications   Medication  Sig Start Date End Date Taking? Authorizing Provider  acetaminophen  (TYLENOL ) 325 MG tablet Take 2 tablets (650 mg total) by mouth every 6 (six) hours as needed for mild pain (pain score 1-3), fever or headache (or Fever >/= 101). 10/19/23   Alexander, Natalie, DO  amLODipine  (NORVASC ) 10 MG tablet Take 10 mg by mouth daily. 03/23/24 03/23/25  [provider]  aspirin  EC 81 MG tablet Take 81 mg by mouth daily. 04/02/24 04/02/25  [provider]  atorvastatin  (LIPITOR ) 80 MG tablet Take 80 mg by mouth.  Take 80 mg by mouth in the morning. 12/21/23 12/20/24  [provider]  bisacodyl  (DULCOLAX) 5 MG EC tablet Take 5 mg by mouth as needed for moderate constipation.    [provider]  carvedilol  (COREG ) 3.125 MG tablet Take 1 tablet (3.125 mg total) by mouth 2 (two) times daily with a meal. 03/04/24  Caleen Qualia, MD  clopidogrel  (PLAVIX ) 75 MG tablet Take 75 mg by mouth daily. 03/23/24 03/23/25  [provider]  cyanocobalamin  (VITAMIN B12) 1000 MCG tablet Take 1,000 mcg by mouth daily.    [provider]  dexlansoprazole (DEXILANT) 60 MG capsule Take 1 capsule by mouth daily. 12/25/21   [provider]  empagliflozin  (JARDIANCE ) 10 MG TABS tablet Take 1 tablet (10 mg total) by mouth daily before breakfast. 08/29/23   Agbata, Tochukwu, MD  Ferrous Sulfate  (IRON ) 325 (65 Fe) MG TABS Take 1 tablet by mouth daily.    [provider]  isosorbide  mononitrate (IMDUR ) 120 MG 24 hr tablet Take 120 mg by mouth daily. 03/23/24 03/23/25  [provider]  lactulose  (CHRONULAC ) 10 GM/15ML solution Taking 15-20 cc by mouth 3 to 4 times a day for constiption Patient taking differently: Taking 15-20 cc by mouth 3 to 4 times a day for constiption as needed 10/08/23   Therisa Bi, MD  nitroGLYCERIN  (NITROSTAT ) 0.4 MG SL tablet Place 0.4 mg under the tongue every 5 (five) minutes as needed. 07/30/23 07/29/24  [provider]  torsemide  (DEMADEX )  20 MG tablet Take 20 mg by mouth daily. 03/22/24 04/02/25  [provider]    Social History:  reports that he quit smoking about 25 years ago. His smoking use included cigarettes. He has never been exposed to tobacco smoke. He has never used smokeless tobacco. He reports that he does not drink alcohol and does not use drugs. Tobacco- Denies use. EtOH- enies use.  Illicit drug use- denies use.  IADLs/ADLs- can perform independently at baseline    Physical Exam: Vitals:   07/01/24 1537 07/01/24 1600 07/01/24 1705 07/01/24 1830  BP:  (!) 159/77 (!) 159/69 (!) 154/61  Pulse:  (!) 59 66 62  Resp:  20 16 (!) 21  Temp: 98 F (36.7 C)     TempSrc: Oral     SpO2:  100% 99% 95%  Weight:      Height:         Gen: NAD, sitting on the side of the bed HENT: Nasal cannula in place CV: Regular rate and rhythm Resp: CTAB, diminished breath sounds at the bases Abd: No TTP, no bowel sounds MSK: No asymmetry Neuro: Alert and oriented x 4 Psych: Pleasant mood   Labs on Admission: I have personally reviewed following labs and imaging studies  CBC: Recent Labs  Lab 06/25/24 1305 07/01/24 0711 07/01/24 0809 07/01/24 1220  WBC  --  8.7  --  6.7  HGB 7.2* 8.5* 8.2* 8.4*  HCT 23.2* 27.1* 24.0* 27.0*  MCV  --  98.2  --  98.5  PLT  --  181  --  156   Basic Metabolic Panel: Recent Labs  Lab 07/01/24 0809 07/01/24 1220 07/01/24 1637  NA 136 135  --   K 4.3 4.6  --   CL  --  98  --   CO2  --  24  --   GLUCOSE  --  158*  --   BUN  --  30*  --   CREATININE  --  1.98*  --   CALCIUM   --  10.5*  --   MG  --   --  2.3   GFR: Estimated Creatinine Clearance: 34.8 mL/min (A) (by C-G formula based on SCr of 1.98 mg/dL (H)). Liver Function Tests: No results for input(s): AST, ALT, ALKPHOS, BILITOT, PROT, ALBUMIN in the last 168 hours. No results for  input(s): LIPASE, AMYLASE in the last 168 hours. No results for input(s): AMMONIA in the last 168  hours. Coagulation Profile: No results for input(s): INR, PROTIME in the last 168 hours. Cardiac Enzymes: No results for input(s): CKTOTAL, CKMB, CKMBINDEX, TROPONINI, TROPONINIHS in the last 168 hours. BNP (last 3 results) Recent Labs    08/22/23 0730 10/16/23 1415 03/02/24 1314  BNP 1,069.2* 942.6* 1,183.2*   HbA1C: No results for input(s): HGBA1C in the last 72 hours. CBG: Recent Labs  Lab 07/01/24 0904  GLUCAP 154*   Lipid Profile: No results for input(s): CHOL, HDL, LDLCALC, TRIG, CHOLHDL, LDLDIRECT in the last 72 hours. Thyroid  Function Tests: No results for input(s): TSH, T4TOTAL, FREET4, T3FREE, THYROIDAB in the last 72 hours. Anemia Panel: No results for input(s): VITAMINB12, FOLATE, FERRITIN, TIBC, IRON , RETICCTPCT in the last 72 hours. Urine analysis:    Component Value Date/Time   COLORURINE YELLOW (A) 03/02/2024 1617   APPEARANCEUR CLEAR (A) 03/02/2024 1617   LABSPEC 1.014 03/02/2024 1617   PHURINE 5.0 03/02/2024 1617   GLUCOSEU >=500 (A) 03/02/2024 1617   HGBUR NEGATIVE 03/02/2024 1617   BILIRUBINUR NEGATIVE 03/02/2024 1617   KETONESUR NEGATIVE 03/02/2024 1617   PROTEINUR 100 (A) 03/02/2024 1617   NITRITE NEGATIVE 03/02/2024 1617   LEUKOCYTESUR NEGATIVE 03/02/2024 1617    Radiological Exams on Admission: I have personally reviewed images DG Chest 2 View Result Date: 07/01/2024 CLINICAL DATA:  Shortness of breath. EXAM: CHEST - 2 VIEW COMPARISON:  03/03/2024. FINDINGS: Cardiomegaly, unchanged. Prior valve replacement and CABG. Median sternotomy. Aortic atherosclerosis. Moderate right and small left pleural effusions with bibasilar opacities, likely reflecting atelectasis. No pneumothorax. No acute osseous abnormality. IMPRESSION: 1. Moderate right and small left pleural effusions with bibasilar atelectasis. 2. Cardiomegaly. Electronically Signed   By: Harrietta Sherry M.D.   On: 07/01/2024 13:26   CARDIAC  CATHETERIZATION Result Date: 07/01/2024   Hemodynamic findings consistent with severe pulmonary hypertension. 1.   RA 15/15 mmHg  RV 87/21  PA 86/42  (51)  PCWP 45/24  (32) 2.  Severe pulmonary hypertension     EKG: My personal interpretation of EKG shows: Atrial fibrillation with a rate at 69 no ST changes.  Left anterior fascicular block that is previously present  Assessment/Plan Principal Problem:   Acute exacerbation of CHF (congestive heart failure) (HCC) Active Problems:   Paroxysmal atrial fibrillation (HCC)   T2DM (type 2 diabetes mellitus) (HCC)   Essential hypertension   OSA (obstructive sleep apnea)   S/P CABG x 3   Hyperlipidemia associated with type 2 diabetes mellitus (HCC)   Acute CHF exacerbation: Patient with dyspnea and elevated pressures on right heart cath admitted for IV diuresis given hypoxia.  Chest x-ray with signs of volume overload.  Patient given IV Lasix  by EDP.  Cardiology consulted.  Appreciate recommendations.  Plan is to continue IV Lasix .  Will monitor strict input and output, daily weights.  Will repeat electrolytes aggressively.  Has slight troponin elevation but appears stable.  Cath also showed pulmonary hypertension and patient has OSA and states he is not adherent to his CPAP.  States his sleep quality is poor given he has to wake up multiple times to urinate given his diuretic.  Chronic problems: Hypertension: Continue home medicines given patient is hypertensive Hyperlipidemia: Continue home statin Type 2 diabetes: Monitor CBGs.  Continue home SGLT2 inhibitor.  Can start SSI if elevated CBGs.  VTE prophylaxis:  Lovenox  Diet: Heart healthy Code Status:  Full Code Telemetry:  Admission  status: Inpatient, Telemetry bed Patient is from: Home Anticipated d/c is to: Home Anticipated d/c is in: 2-3 days   Family Communication: Updated at bedside  Consults called: None   Severity of Illness: The appropriate patient status for this patient is  INPATIENT. Inpatient status is judged to be reasonable and necessary in order to provide the required intensity of service to ensure the patient's safety. The patient's presenting symptoms, physical exam findings, and initial radiographic and laboratory data in the context of their chronic comorbidities is felt to place them at high risk for further clinical deterioration. Furthermore, it is not anticipated that the patient will be medically stable for discharge from the hospital within 2 midnights of admission.   * I certify that at the point of admission it is my clinical judgment that the patient will require inpatient hospital care spanning beyond 2 midnights from the point of admission due to high intensity of service, high risk for further deterioration and high frequency of surveillance required.DEWAINE Morene Bathe, MD Jolynn DEL. [facility]

## 2024-07-01 NOTE — ED Notes (Addendum)
 Resumed care from [place] t rn.  Pt alert.  Pt on 3 liters oxygen [place].  Pt has sob.  No chest pain.

## 2024-07-01 NOTE — ED Notes (Signed)
 Meds given.  Pt to cpod via wheelchair.  Report off to les paramedic .

## 2024-07-01 NOTE — Telephone Encounter (Signed)
-----   Message from Annah JAYSON Skene sent at 06/19/2024  8:05 AM EST ----- Please send myeloid mutation panel/ ngs on his bone marrow specimen ----- Message ----- From: Interface, Lab In Three Zero One Sent: 06/18/2024  11:33 AM EST To: Annah JAYSON Skene, MD

## 2024-07-02 ENCOUNTER — Inpatient Hospital Stay

## 2024-07-02 ENCOUNTER — Encounter: Payer: Self-pay | Admitting: Oncology

## 2024-07-02 ENCOUNTER — Inpatient Hospital Stay: Admitting: Oncology

## 2024-07-02 ENCOUNTER — Encounter (HOSPITAL_COMMUNITY): Payer: Self-pay

## 2024-07-02 DIAGNOSIS — E1169 Type 2 diabetes mellitus with other specified complication: Secondary | ICD-10-CM

## 2024-07-02 DIAGNOSIS — I272 Pulmonary hypertension, unspecified: Secondary | ICD-10-CM

## 2024-07-02 DIAGNOSIS — I480 Paroxysmal atrial fibrillation: Secondary | ICD-10-CM | POA: Diagnosis not present

## 2024-07-02 DIAGNOSIS — I10 Essential (primary) hypertension: Secondary | ICD-10-CM

## 2024-07-02 DIAGNOSIS — I509 Heart failure, unspecified: Secondary | ICD-10-CM | POA: Diagnosis not present

## 2024-07-02 DIAGNOSIS — E785 Hyperlipidemia, unspecified: Secondary | ICD-10-CM

## 2024-07-02 LAB — GLUCOSE, CAPILLARY
Glucose-Capillary: 143 mg/dL — ABNORMAL HIGH (ref 70–99)
Glucose-Capillary: 189 mg/dL — ABNORMAL HIGH (ref 70–99)
Glucose-Capillary: 164 mg/dL — ABNORMAL HIGH (ref 70–99)
Glucose-Capillary: 164 mg/dL — ABNORMAL HIGH (ref 70–99)

## 2024-07-02 LAB — CBC
HCT: 22.5 % — ABNORMAL LOW (ref 39.0–52.0)
Hemoglobin: 7.2 g/dL — ABNORMAL LOW (ref 13.0–17.0)
MCH: 31.2 pg (ref 26.0–34.0)
MCHC: 32 g/dL (ref 30.0–36.0)
MCV: 97.4 fL (ref 80.0–100.0)
Platelets: 138 K/uL — ABNORMAL LOW (ref 150–400)
RBC: 2.31 MIL/uL — ABNORMAL LOW (ref 4.22–5.81)
RDW: 20.9 % — ABNORMAL HIGH (ref 11.5–15.5)
WBC: 5.7 K/uL (ref 4.0–10.5)
nRBC: 0 % (ref 0.0–0.2)

## 2024-07-02 LAB — BODY FLUID CELL COUNT WITH DIFFERENTIAL
Eos, Fluid: 0 %
Lymphs, Fluid: 74 %
Monocyte-Macrophage-Serous Fluid: 9 %
Neutrophil Count, Fluid: 17 %
Total Nucleated Cell Count, Fluid: 199 uL

## 2024-07-02 LAB — BASIC METABOLIC PANEL WITH GFR
Anion gap: 8 (ref 5–15)
BUN: 32 mg/dL — ABNORMAL HIGH (ref 8–23)
CO2: 30 mmol/L (ref 22–32)
Calcium: 9.9 mg/dL (ref 8.9–10.3)
Chloride: 100 mmol/L (ref 98–111)
Creatinine, Ser: 2 mg/dL — ABNORMAL HIGH (ref 0.61–1.24)
GFR, Estimated: 34 mL/min — ABNORMAL LOW (ref >60)
Glucose, Bld: 144 mg/dL — ABNORMAL HIGH (ref 70–99)
Potassium: 4.1 mmol/L (ref 3.5–5.1)
Sodium: 137 mmol/L (ref 135–145)

## 2024-07-02 LAB — PREPARE RBC (CROSSMATCH)

## 2024-07-02 MED ORDER — LIDOCAINE HCL (PF) 1 % IJ SOLN
10.0000 mL | Freq: Once | INTRAMUSCULAR | Status: AC
  Administered 2024-07-02: 10 mL via INTRADERMAL
  Filled 2024-07-02: qty 10

## 2024-07-02 MED ORDER — SODIUM CHLORIDE 0.9% IV SOLUTION: Freq: Once | INTRAVENOUS | Status: AC

## 2024-07-02 MED ORDER — HEPARIN SODIUM (PORCINE) 5000 UNIT/ML IJ SOLN
5000.0000 [IU] | Freq: Three times a day (TID) | INTRAMUSCULAR | Status: DC
  Administered 2024-07-02 – 2024-07-05 (×10): 5000 [IU] via SUBCUTANEOUS
  Filled 2024-07-02 (×10): qty 1

## 2024-07-02 NOTE — Progress Notes (Signed)
 [name] CLINIC CARDIOLOGY PROGRESS NOTE       Patient ID: [name] MRN: [id number] DOB/AGE: [birth year] 78 y.o.  Admit date: 07/01/2024 Referring Physician Dr. Oneil Budge Primary Physician Pcp, No  Primary Cardiologist Dr. Ammon Reason for Consultation AoCHF, hypoxia  HPI: [name] is a 78 y.o. male  with a past medical history of chronic HFpEF, persistent atrial fibrillation, aortic stenosis s/p TAVR (01/2024), coronary artery disease s/p CABG x3, multiple coronary stents, most recent stent to ostial SVG in-stent restenosis (2021), hypertension, hyperlipidemia, OSA, CKD, diabetes who presented to the ED on 07/01/2024 for hypoxia.  Had RHC this morning and prior to this as well as in recovery afterwards he was quite hypoxic and was unable to be weaned to room air thus he was sent to the ED for evaluation.  Cardiology was consulted for further evaluation.   Interval history: - Patient seen and examined this morning, sitting upright in hospital bed. - States that shortness of breath is about the same today, now on 4 L nasal cannula. - Suspect he will require thoracentesis given persistent shortness of breath and moderate pleural effusion noted on chest x-ray yesterday, repeat chest x-ray this AM pending read.  - BP and HR stable.   Review of systems complete and found to be negative unless listed above    Past Medical History:  Diagnosis Date   (HFpEF) heart failure with preserved ejection fraction (HCC) 07/25/2016   a.)TTE 07/25/16: EF >55, triv-mild pan regur, mild AS, G2DD; b.)TTE 09/02/16: EF >55, triv TR, mild AS; c.)TTE 11/05/16: EF >55, LAE, triv-mild pan regur, mild AS, G2DD; d.)TTE 01/21/20: EF >55, mod LVH, LAE/RVE, triv-mild AR/PR/TR, mod MR, mild AS, G2DD; e.)TTE 09/24/22: EF>55, sev LVH, sev LAE, RAE, mild PR, mod AR/MR/TR, mild-mod AS, RVSP 74.7; f.)TTE 12/23/22: EF 60-65, LAE, mild-mod MR/TR, mild AS   Abnormal urine 05/01/2023   Anemia    Aortic  atherosclerosis    Aortic stenosis 07/25/2016   a.) TTE 07/25/2016: mild (MPG 13.5); b.) TTE 09/02/2016: mild (MPG 16); c.) TTE 11/05/2016: mild (MPG 9); d.) TTE 01/21/2020: mild (MPG 11.7); e.) TTE 09/24/2022: mild-mod (MPG 20.3); f.) TTE 12/23/2022: mild (MPG 15)   Atrial fibrillation (HCC)    a.) CHA2DS2VASc = 6 (age x2, CHF, HTN, vascular disease history, T2DM);  b.) rate/rhythm maintained on oral labetalol ; chronically anticoagulated with apixaban  + clopidogrel    AV block, 1st degree    B12 deficiency    CKD (chronic kidney disease), stage III (HCC)    Colon polyps    Congestive heart failure (HCC) 05/01/2023   Coronary artery disease 08/16/2016   a.) s/p 3v CABG 08/30/2016; b.) s/p PCI 11/26/2016 (DES x 1 oSVG-RCA); c.) s/p PCI 12/17/2016 (DES x 5 --> mLCx x2, dLCx, pLAD, dLAD); c.) s/p PCI 06/09/2020 (DES x1 --> ISR oSVG-PDA)   Cyst of kidney, acquired 07/03/2023   Dyspnea    Gastroesophageal reflux disease 05/01/2023   GERD (gastroesophageal reflux disease)    Heart murmur    Hepatic steatosis    History of 2019 novel coronavirus disease (COVID-19) 09/02/2020   a.) Tx'd with remdesivir    History of blood transfusion    History of GI bleed 09/02/2020   a.) admitted to [facility] 09/02/2020 - 09/08/2020   History of heart artery stent 11/26/2016   TOTAL of 7 stents: a.) 11/26/2016 --> 3.5 x 22 mm Resolute Integrity oSVG-RCA; b.) 12/17/2016: 3.0 x 12 mm Xience Alpine dLCx, 3.5 x 22 mm Resolute Onyx mLCx, 3.5  x 18 mm Resolute Onyx mLCx, 3.0 x 26 mm Resolute Onyx pLAD, 2.0 x 26 mm Resolute Onyx dLAD; c.) 06/09/2020 --> 2.5 x 18 mm Resolute Onyx ISR oSVG-PDA   History of partial colectomy 08/12/2012   a.) large gastric hyperplastic polyp removal   HLD (hyperlipidemia)    Hypertension    LBBB (left bundle branch block)    Long term current use of anticoagulant    a.) apixaban    Long term current use of antithrombotics/antiplatelets    a.) clopidogrel    Male hypogonadism    a.) on  exogenous TRT (1.62% gel)   Mild cardiomegaly    Mild gynecomastia (bilateral)    Nephrolithiasis    OSA (obstructive sleep apnea)    a.) does not utilize nocturnal PAP therapy   Personal history of surgery to heart and great vessels, presenting hazards to health 05/01/2023   Proteinuria, unspecified 08/01/2023   Pulmonary hypertension (HCC) 09/24/2022   a.) TTE 09/24/2022: RVSP 74.7; b.) TTE 12/23/2022: RVSP 31.3   RBBB (right bundle branch block with left anterior fascicular block)    Right inguinal hernia    Right thyroid  nodule    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   a.) LIMA-LAD, SVG-PDA, SVG-OM3   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/01/2023   Type 2 diabetes mellitus treated with insulin  (HCC)    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   Umbilical hernia    Unstable angina (HCC)    Vitamin D  deficiency     Past Surgical History:  Procedure Laterality Date   ANOMALOUS PULMONARY VENOUS RETURN REPAIR, TOTAL  2025   APPENDECTOMY     CARDIAC CATHETERIZATION Left 08/16/2016   Procedure: Left Heart Cath and Coronary Angiography;  Surgeon: Cara JONETTA Lovelace, MD;  Location: [facility] INVASIVE CV LAB;  Service: Cardiovascular;  Laterality: Left;   COLONOSCOPY N/A 09/07/2020   Procedure: COLONOSCOPY;  Surgeon: Janalyn Keene NOVAK, MD;  Location: [facility] ENDOSCOPY;  Service: Endoscopy;  Laterality: N/A;   COLONOSCOPY N/A 10/19/2023   Procedure: COLONOSCOPY;  Surgeon: Jinny Carmine, MD;  Location: [facility] ENDOSCOPY;  Service: Endoscopy;  Laterality: N/A;   COLONOSCOPY WITH PROPOFOL  N/A 05/17/2021   Procedure: COLONOSCOPY WITH PROPOFOL ;  Surgeon: Janalyn Keene NOVAK, MD;  Location: [facility] ENDOSCOPY;  Service: Endoscopy;  Laterality: N/A;   CORONARY ANGIOPLASTY WITH STENT PLACEMENT Left 11/26/2016   Procedure: CORONARY ANGIOPLASTY WITH STENT PLACEMENT; Location: [facility]; Surgeon: Alm Dais, MD   CORONARY ARTERY BYPASS GRAFT N/A 08/30/2016   Procedure: CORONARY ARTERY  BYPASS GRAFT; Location: [facility]; Surgeon: Juliene Pouch, MD   CORONARY STENT INTERVENTION N/A 06/09/2020   Procedure: CORONARY STENT INTERVENTION;  Surgeon: Lovelace Cara JONETTA, MD;  Location: [facility] CV LAB;  Service: Cardiovascular;  Laterality: N/A;   CORONARY STENT INTERVENTION Left 12/17/2016   Procedure: CORONARY STENT INTERVENTION; Location: [facility]; Surgeon: Alm Dais, MD   CORONARY STENT INTERVENTION  03/2024   ENDOSCOPIC MUCOSAL RESECTION N/A 09/22/2020   Procedure: ENDOSCOPIC MUCOSAL RESECTION;  Surgeon: [name] Aloha Raddle., MD;  Location: [facility] ENDOSCOPY;  Service: Gastroenterology;  Laterality: N/A;   ESOPHAGOGASTRODUODENOSCOPY N/A 09/07/2020   Procedure: ESOPHAGOGASTRODUODENOSCOPY (EGD);  Surgeon: Janalyn Keene NOVAK, MD;  Location: [facility] ENDOSCOPY;  Service: Endoscopy;  Laterality: N/A;   ESOPHAGOGASTRODUODENOSCOPY N/A 10/19/2023   Procedure: EGD (ESOPHAGOGASTRODUODENOSCOPY);  Surgeon: Jinny Carmine, MD;  Location: [facility] ENDOSCOPY;  Service: Endoscopy;  Laterality: N/A;   ESOPHAGOGASTRODUODENOSCOPY (EGD) WITH PROPOFOL  N/A 09/22/2020   Procedure: ESOPHAGOGASTRODUODENOSCOPY (EGD) WITH PROPOFOL ;  Surgeon: [name] Aloha Raddle., MD;  Location: [facility] ENDOSCOPY;  Service: Gastroenterology;  Laterality: N/A;   ESOPHAGOGASTRODUODENOSCOPY (EGD) WITH PROPOFOL  N/A 08/26/2023   Procedure: ESOPHAGOGASTRODUODENOSCOPY (EGD) WITH PROPOFOL ;  Surgeon: Jinny Carmine, MD;  Location: [facility] ENDOSCOPY;  Service: Endoscopy;  Laterality: N/A;   FRACTURE SURGERY     HEMOSTASIS CLIP PLACEMENT  09/22/2020   Procedure: HEMOSTASIS CLIP PLACEMENT;  Surgeon: [name] Aloha Raddle., MD;  Location: [facility] ENDOSCOPY;  Service: Gastroenterology;;   HEMOSTASIS CONTROL  09/22/2020   Procedure: HEMOSTASIS CONTROL;  Surgeon: [name] Aloha Raddle., MD;  Location: [facility] ENDOSCOPY;  Service: Gastroenterology;;   INSERTION OF MESH  02/19/2023   Procedure: INSERTION OF MESH;  Surgeon: Desiderio Schanz, MD;  Location: [facility] ORS;   Service: General;;  umbilical   LAPAROSCOPIC PARTIAL RIGHT COLECTOMY Right 08/12/2012   Procedure: LAPAROSCOPIC PARTIAL RIGHT COLECTOMY; Location: [facility]; Surgeon: Unknown Sharps, MD   LEFT HEART CATH AND CORONARY ANGIOGRAPHY Left 06/09/2020   Procedure: LEFT HEART CATH AND CORONARY ANGIOGRAPHY;  Surgeon: Florencio Cara BIRCH, MD;  Location: [facility] CV [facility];  Service: Cardiovascular;  Laterality: Left;   LEFT HEART CATH AND CORS/GRAFTS ANGIOGRAPHY Left 11/20/2016   Procedure: Left Heart Cath and Cors/Grafts Angiography;  Surgeon: Cara BIRCH Florencio, MD;  Location: [facility] CV LAB;  Service: Cardiovascular;  Laterality: Left;   POLYPECTOMY  09/22/2020   Procedure: POLYPECTOMY;  Surgeon: [name] Aloha Raddle., MD;  Location: [facility] ENDOSCOPY;  Service: Gastroenterology;;   POLYPECTOMY  10/19/2023   Procedure: POLYPECTOMY;  Surgeon: Jinny Carmine, MD;  Location: [facility] ENDOSCOPY;  Service: Endoscopy;;   RIGHT HEART CATH Right 07/01/2024   Procedure: RIGHT HEART CATH;  Surgeon: Ammon Blunt, MD;  Location: [facility];  Service: Cardiovascular;  Laterality: Right;   SUBMUCOSAL LIFTING INJECTION  09/22/2020   Procedure: SUBMUCOSAL LIFTING INJECTION;  Surgeon: [name] Aloha Raddle., MD;  Location: [facility] ENDOSCOPY;  Service: Gastroenterology;;   UMBILICAL HERNIA REPAIR N/A 02/19/2023   Procedure: HERNIA REPAIR UMBILICAL ADULT, open;  Surgeon: Desiderio Schanz, MD;  Location: [facility] ORS;  Service: General;  Laterality: N/A;   XI ROBOTIC ASSISTED INGUINAL HERNIA REPAIR WITH MESH Right 02/19/2023   Procedure: XI ROBOTIC ASSISTED INGUINAL HERNIA REPAIR WITH MESH;  Surgeon: Desiderio Schanz, MD;  Location: [facility] ORS;  Service: General;  Laterality: Right;    Medications Prior to Admission  Medication Sig Dispense Refill Last Dose/Taking   acetaminophen  (TYLENOL ) 325 MG tablet Take 2 tablets (650 mg total) by mouth every 6 (six) hours as needed for mild pain (pain score 1-3), fever or headache (or  Fever >/= 101).   Unknown   amLODipine  (NORVASC ) 10 MG tablet Take 10 mg by mouth daily.   06/30/2024   aspirin  EC 81 MG tablet Take 81 mg by mouth daily.   06/30/2024   atorvastatin  (LIPITOR ) 80 MG tablet Take 80 mg by mouth.  Take 80 mg by mouth in the morning.   06/30/2024   bisacodyl  (DULCOLAX) 5 MG EC tablet Take 5 mg by mouth as needed for moderate constipation.   Unknown   carvedilol  (COREG ) 3.125 MG tablet Take 1 tablet (3.125 mg total) by mouth 2 (two) times daily with a meal. 60 tablet 1 06/30/2024   clopidogrel  (PLAVIX ) 75 MG tablet Take 75 mg by mouth daily.   06/30/2024   cyanocobalamin  (VITAMIN B12) 1000 MCG tablet Take 1,000 mcg by mouth daily.   06/30/2024   dexlansoprazole (DEXILANT) 60 MG capsule Take 1 capsule by mouth daily.   06/30/2024   empagliflozin  (JARDIANCE ) 10 MG TABS tablet Take 1 tablet (  10 mg total) by mouth daily before breakfast. 30 tablet 0 06/30/2024   Ferrous Sulfate  (IRON ) 325 (65 Fe) MG TABS Take 1 tablet by mouth daily.   06/30/2024   isosorbide  mononitrate (IMDUR ) 120 MG 24 hr tablet Take 120 mg by mouth daily.   06/30/2024   lactulose  (CHRONULAC ) 10 GM/15ML solution Taking 15-20 cc by mouth 3 to 4 times a day for constiption (Patient taking differently: Taking 15-20 cc by mouth 3 to 4 times a day for constiption as needed) 946 mL 2 Unknown   nitroGLYCERIN  (NITROSTAT ) 0.4 MG SL tablet Place 0.4 mg under the tongue every 5 (five) minutes as needed.   Unknown   torsemide  (DEMADEX ) 20 MG tablet Take 20 mg by mouth daily.   06/30/2024   Social History   Socioeconomic History   Marital status: Married    Spouse name: Not on file   Number of children: Not on file   Years of education: Not on file   Highest education level: Not on file  Occupational History   Not on file  Tobacco Use   Smoking status: Former    Current packs/day: 0.00    Types: Cigarettes    Quit date: 2000    Years since quitting: 25.9    Passive exposure: Never   Smokeless tobacco:  Never  Vaping Use   Vaping status: Never Used  Substance and Sexual Activity   Alcohol use: No   Drug use: No   Sexual activity: Not Currently  Other Topics Concern   Not on file  Social History Narrative   Not on file   Social Drivers of Health   Financial Resource Strain: Low Risk  (05/08/2024)   Received from [facility]   Overall Financial Resource Strain (CARDIA)    Difficulty of Paying Living Expenses: Not hard at all  Food Insecurity: No Food Insecurity (07/01/2024)   Hunger Vital Sign    Worried About Running Out of Food in the Last Year: Never true    Ran Out of Food in the Last Year: Never true  Transportation Needs: No Transportation Needs (07/01/2024)   PRAPARE - Administrator, Civil Service (Medical): No    Lack of Transportation (Non-Medical): No  Physical Activity: Not on file  Stress: Not on file  Social Connections: Moderately Isolated (07/01/2024)   Social Connection and Isolation Panel    Frequency of Communication with Friends and Family: More than three times a week    Frequency of Social Gatherings with Friends and Family: More than three times a week    Attends Religious Services: Never    Database Administrator or Organizations: No    Attends Banker Meetings: Never    Marital Status: Married  Catering Manager Violence: Not At Risk (07/01/2024)   Humiliation, Afraid, Rape, and Kick questionnaire    Fear of Current or Ex-Partner: No    Emotionally Abused: No    Physically Abused: No    Sexually Abused: No    Family History  Problem Relation Age of Onset   Hyperlipidemia Mother    Heart disease Mother    Hypertension Father      Vitals:   07/01/24 2330 07/02/24 0333 07/02/24 0500 07/02/24 0802  BP: (!) 161/61 (!) 143/58  (!) 154/64  Pulse: 64   (!) 57  Resp: 20 20  19   Temp: (!) 97.5 F (36.4 C) (!) 97.5 F (36.4 C)  98.1 F (36.7 C)  TempSrc:  Oral    SpO2: 92% 99%  95%  Weight: 84.8 kg  84.6  kg   Height: 6' 1 (1.854 m)       PHYSICAL EXAM General: Chronically ill-appearing male, well nourished, in no acute distress. HEENT: Normocephalic and atraumatic. Neck: No JVD.  Lungs: Normal respiratory effort on 4L [place].  Bibasilar crackles. Heart: Irregularly irregular, controlled rate. Normal S1 and S2 without gallops. + Murmur Abdomen: Non-distended appearing.  Msk: Normal strength and tone for age. Extremities: Warm and well perfused. No clubbing, cyanosis.  Trace pitting edema.  Neuro: Alert and oriented X 3. Psych: Answers questions appropriately.   Labs: Basic Metabolic Panel: Recent Labs    07/01/24 1220 07/01/24 1637 07/02/24 0429  NA 135  --  137  K 4.6  --  4.1  CL 98  --  100  CO2 24  --  30  GLUCOSE 158*  --  144*  BUN 30*  --  32*  CREATININE 1.98*  --  2.00*  CALCIUM  10.5*  --  9.9  MG  --  2.3  --    Liver Function Tests: No results for input(s): AST, ALT, ALKPHOS, BILITOT, PROT, ALBUMIN in the last 72 hours. No results for input(s): LIPASE, AMYLASE in the last 72 hours. CBC: Recent Labs    07/01/24 1220 07/02/24 0429  WBC 6.7 5.7  HGB 8.4* 7.2*  HCT 27.0* 22.5*  MCV 98.5 97.4  PLT 156 138*   Cardiac Enzymes: No results for input(s): CKTOTAL, CKMB, CKMBINDEX, TROPONINIHS in the last 72 hours. BNP: No results for input(s): BNP in the last 72 hours. D-Dimer: No results for input(s): DDIMER in the last 72 hours. Hemoglobin A1C: No results for input(s): HGBA1C in the last 72 hours. Fasting Lipid Panel: No results for input(s): CHOL, HDL, LDLCALC, TRIG, CHOLHDL, LDLDIRECT in the last 72 hours. Thyroid  Function Tests: No results for input(s): TSH, T4TOTAL, T3FREE, THYROIDAB in the last 72 hours.  Invalid input(s): FREET3 Anemia Panel: No results for input(s): VITAMINB12, FOLATE, FERRITIN, TIBC, IRON , RETICCTPCT in the last 72 hours.   Radiology: DG Chest 2 View Result Date:  07/01/2024 CLINICAL DATA:  Shortness of breath. EXAM: CHEST - 2 VIEW COMPARISON:  03/03/2024. FINDINGS: Cardiomegaly, unchanged. Prior valve replacement and CABG. Median sternotomy. Aortic atherosclerosis. Moderate right and small left pleural effusions with bibasilar opacities, likely reflecting atelectasis. No pneumothorax. No acute osseous abnormality. IMPRESSION: 1. Moderate right and small left pleural effusions with bibasilar atelectasis. 2. Cardiomegaly. Electronically Signed   By: Harrietta Sherry M.D.   On: 07/01/2024 13:26   CARDIAC CATHETERIZATION Result Date: 07/01/2024   Hemodynamic findings consistent with severe pulmonary hypertension. 1.   RA 15/15 mmHg  RV 87/21  PA 86/42  (51)  PCWP 45/24  (32) 2.  Severe pulmonary hypertension    CT BONE MARROW BIOPSY & ASPIRATION Result Date: 06/15/2024 CLINICAL DATA:  Refractory edema related to chronic kidney disease and need for bone marrow biopsy. EXAM: CT GUIDED BONE MARROW ASPIRATION AND BIOPSY ANESTHESIA/SEDATION: Moderate (conscious) sedation was employed during this procedure. A total of Versed  2.0 mg and Fentanyl  100 mcg was administered intravenously. Moderate Sedation Time: 12 minutes. The patient's level of consciousness and vital signs were monitored continuously by radiology nursing throughout the procedure under my direct supervision. PROCEDURE: The procedure risks, benefits, and alternatives were explained to the patient. Questions regarding the procedure were encouraged and answered. The patient understands and consents to the procedure. A time out was performed prior  to initiating the procedure. The right gluteal region was prepped with chlorhexidine . Sterile gown and sterile gloves were used for the procedure. Local anesthesia was provided with 1% Lidocaine . Under CT guidance, an 11 gauge On Control bone cutting needle was advanced from a posterior approach into the right iliac bone. Needle positioning was confirmed with CT. Initial  non heparinized and heparinized aspirate samples were obtained of bone marrow. Core biopsy was performed via the On Control drill needle. COMPLICATIONS: None FINDINGS: Inspection of initial aspirate did reveal visible particles. Intact core biopsy sample was obtained. IMPRESSION: CT guided bone marrow biopsy of right posterior iliac bone with both aspirate and core samples obtained. Electronically Signed   By: Marcey Moan M.D.   On: 06/15/2024 11:03    ECHO 03/2024 [facility]: NORMAL LEFT VENTRICULAR SYSTOLIC FUNCTION WITH SEVERE LVH  ESTIMATED EF: >55%, CALC EF(3D): 55%  ELEVATED LA PRESSURES WITH DIASTOLIC DYSFUNCTION (GRADE 3)  NORMAL RIGHT VENTRICULAR SYSTOLIC FUNCTION  VALVULAR REGURGITATION: MILD AR, MILD MR, TRIVIAL PR, MILD TR  ESTIMATED RVSP: 85 mmHg (ABNORMAL)  VALVULAR STENOSIS: TRANSCATHETER BIOPROSTHETIC, No MS, No PS, No TS   TELEMETRY (personally reviewed): Atrial fibrillation rate 60-70s  EKG (personally reviewed): AF rate 69 bpm  Data reviewed by me 07/02/2024: last 24h vitals tele labs imaging I/O ED provider note, admission H&P  Principal Problem:   Acute exacerbation of CHF (congestive heart failure) (HCC) Active Problems:   T2DM (type 2 diabetes mellitus) (HCC)   Essential hypertension   OSA (obstructive sleep apnea)   S/P CABG x 3   Hyperlipidemia associated with type 2 diabetes mellitus (HCC)   Paroxysmal atrial fibrillation (HCC)    ASSESSMENT AND PLAN:  [name] is a 78 y.o. male  with a past medical history of chronic HFpEF, persistent atrial fibrillation, aortic stenosis s/p TAVR (01/2024), coronary artery disease s/p CABG x3, multiple coronary stents, most recent stent to ostial SVG in-stent restenosis (2021), hypertension, hyperlipidemia, OSA, CKD, diabetes who presented to the ED on 07/01/2024 for hypoxia.  Had RHC this morning and prior to this as well as in recovery afterwards he was quite hypoxic and was unable to be weaned to room air thus he was  sent to the ED for evaluation.  Cardiology was consulted for further evaluation.   # Acute hypoxic respiratory failure # Acute on chronic HFpEF # Coronary artery disease s/p CABG # Demand ischemia # Aortic stenosis s/p TAVR 01/2024 Patient presented for outpatient RHC, persistently hypoxic postprocedure and sent to the ED for evaluation.  BNP elevated at 12,000.  Initial troponin 121, he is without chest pain.  Chest x-ray with pleural effusions. - Continue IV Lasix  60 mg twice daily. - Patient to be set up for thoracentesis today. - Continue home atorvastatin  80 mg daily, aspirin  81 mg daily, Plavix  75 mg daily. - Continue home Imdur  120 mg daily, amlodipine  10 mg daily, carvedilol  3.125 mg twice daily.  Will wait to resume home Jardiance  pending renal function with IV diuresis. -Mild troponin elevation most consistent with demand/supply mismatch and not ACS.    This patient's plan of care was discussed and created with Dr. Florencio and he is in agreement.  Signed: Danita Bloch, PA-C  07/02/2024, 9:43 AM [facility] Cardiology

## 2024-07-02 NOTE — Progress Notes (Signed)
 PROGRESS NOTE    KULE GASCOIGNE  [name] DOB: [birth year] DOA: 07/01/2024 PCP: Pcp, No   Brief Narrative:   78 y.o. year old male with medical history of hypertension, hyperlipidemia, type 2 diabetes, CAD status post CABG, CHF, paroxysmal A-fib, OSA presenting from Cath Lab given he was hypoxic post cath   11/20: cardio c/s, US  guided Thora, 1 PRBC transfusion   Assessment & Plan:   Principal Problem:   Acute exacerbation of CHF (congestive heart failure) (HCC) Active Problems:   Paroxysmal atrial fibrillation (HCC)   T2DM (type 2 diabetes mellitus) (HCC)   Essential hypertension   OSA (obstructive sleep apnea)   S/P CABG x 3   Hyperlipidemia associated with type 2 diabetes mellitus (HCC)  Acute hypoxic respi failure Needing 4 liters O2 via [place]  Acute on chronic diastolic CHF exacerbation Coronary artery disease s/p CABG Demand ischemia 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  Chest x-ray with signs of volume overload.   - Cardiology seen  - Continue IV Lasix  60 mg bid  Will monitor strict input and output, daily weights.   - Cath also showed pulmonary hypertension and patient has OSA and states he is not adherent to his CPAP.  States his sleep quality is poor given he has to wake up multiple times to urinate given his diuretic. Left lower extremity swelling: - US /Doppler neg for DVT - continue asa, plavix    Rt pleural effusion US  guided thora with removal of 700 cc of fluid  Anemia of CKD Hb 7.2 - will transfuse 1 PRBC  Hypertension: Continue norvasc , coreg , imdur  Hyperlipidemia: Continue lipitor  Type 2 diabetes: Monitor CBGs.  Continue home SGLT2 inhibitor.   DVT prophylaxis: Heparin  heparin  injection 5,000 Units Start: 07/02/24 0600     Code Status: Full code Family Communication: none at bedside Disposition Plan: possible DC in 2-3 days depending on  clinical condition and cardio w/up   Consultants:  Cardio  Procedures:  US  guided thora     Subjective:  SOB +, weak  Objective: Vitals:   07/02/24 1616 07/02/24 1630 07/02/24 1631 07/02/24 2038  BP: 135/79 (!) 144/59 (!) 144/59 (!) 156/64  Pulse: 60  60 (!) 58  Resp: 20 18 18 20   Temp: 98.2 F (36.8 C) 98 F (36.7 C) 98 F (36.7 C) 97.7 F (36.5 C)  TempSrc: Oral Oral    SpO2:  100%  96%  Weight:      Height:        Intake/Output Summary (Last 24 hours) at 07/02/2024 2135 Last data filed at 07/02/2024 2000 Gross per 24 hour  Intake 1355.5 ml  Output 1650 ml  Net -294.5 ml   Filed Weights   07/01/24 1219 07/01/24 2330 07/02/24 0500  Weight: 90.3 kg 84.8 kg 84.6 kg    Examination:  General exam: Appears calm and comfortable, chronically ill looking Respiratory system: decreased BS at bases Cardiovascular system: S1 & S2 heard, RRR. No murmurs. No pedal edema. Gastrointestinal system: Abdomen is soft, benign Central nervous system: Alert and oriented. No focal neurological deficits. Extremities: Symmetric 5 x 5 power. Skin: No rashes, lesions or ulcers Psychiatry: Judgement and insight appear normal. Mood & affect appropriate.     Data Reviewed: I have personally reviewed following labs and imaging studies  CBC: Recent Labs  Lab 07/01/24 0711 07/01/24 0809 07/01/24 1220 07/02/24 0429  WBC 8.7  --  6.7 5.7  HGB 8.5* 8.2* 8.4* 7.2*  HCT 27.1* 24.0* 27.0* 22.5*  MCV 98.2  --  98.5 97.4  PLT 181  --  156 138*   Basic Metabolic Panel: Recent Labs  Lab 07/01/24 0809 07/01/24 1220 07/01/24 1637 07/02/24 0429  NA 136 135  --  137  K 4.3 4.6  --  4.1  CL  --  98  --  100  CO2  --  24  --  30  GLUCOSE  --  158*  --  144*  BUN  --  30*  --  32*  CREATININE  --  1.98*  --  2.00*  CALCIUM   --  10.5*  --  9.9  MG  --   --  2.3  --    BNP (last 3 results) Recent Labs    07/01/24 1220  PROBNP 12,167.0*   HbA1C: No results for input(s):  HGBA1C in the last 72 hours. CBG: Recent Labs  Lab 07/01/24 2353 07/02/24 0803 07/02/24 1205 07/02/24 1641 07/02/24 2133  GLUCAP 136* 143* 189* 164* 164*    Radiology Studies: US  Venous Img Lower Unilateral Left (DVT) Result Date: 07/02/2024 CLINICAL DATA:  Swelling. EXAM: LEFT LOWER EXTREMITY VENOUS DOPPLER ULTRASOUND TECHNIQUE: Gray-scale sonography with graded compression, as well as color Doppler and duplex ultrasound were performed to evaluate the lower extremity deep venous systems from the level of the common femoral vein and including the common femoral, femoral, profunda femoral, popliteal and calf veins including the posterior tibial, peroneal and gastrocnemius veins when visible. Spectral Doppler was utilized to evaluate flow at rest and with distal augmentation maneuvers in the common femoral, femoral and popliteal veins. COMPARISON:  12/20/2022 FINDINGS: Contralateral Common Femoral Vein: Respiratory phasicity is normal and symmetric with the symptomatic side. No evidence of thrombus. Normal compressibility. Common Femoral Vein: No evidence of thrombus. Normal compressibility, respiratory phasicity and response to augmentation. Saphenofemoral Junction: No evidence of thrombus. Normal compressibility and flow on color Doppler imaging. Profunda Femoral Vein: No evidence of thrombus. Normal compressibility and flow on color Doppler imaging. Femoral Vein: No evidence of thrombus. Normal compressibility, respiratory phasicity and response to augmentation. Popliteal Vein: No evidence of thrombus. Normal compressibility, respiratory phasicity and response to augmentation. Calf Veins: No evidence of thrombus. Normal compressibility and flow on color Doppler imaging. Other Findings:  None. IMPRESSION: Negative for deep venous thrombosis in left lower extremity. Electronically Signed   By: Juliene Balder M.D.   On: 07/02/2024 17:01   US  THORACENTESIS ASP PLEURAL SPACE W/IMG GUIDE Result Date:  07/02/2024 INDICATION: 78 year old male presents with dyspnea and right-sided pleural effusion. Received request for diagnostic and therapeutic thoracentesis. EXAM: ULTRASOUND GUIDED DIAGNOSTIC AND THERAPEUTIC, RIGHT-SIDED THORACENTESIS MEDICATIONS: 10 mL 1% lidocaine  COMPLICATIONS: None immediate. PROCEDURE: An ultrasound guided thoracentesis was thoroughly discussed with the patient and questions answered. The benefits, risks, alternatives and complications were also discussed. The patient understands and wishes to proceed with the procedure. Written consent was obtained. Ultrasound was performed to localize and mark an adequate pocket of fluid in the right chest. The area was then prepped and draped in the normal sterile fashion. 1% lidocaine  was used for local anesthesia. Under ultrasound guidance a 6 Fr Safe-T-Centesis catheter was introduced. Thoracentesis was performed. The catheter was removed and a dressing applied. FINDINGS: A total of approximately 700 mL of amber fluid was removed. Samples were sent to the laboratory as requested by the clinical team. IMPRESSION: Successful ultrasound guided right thoracentesis yielding 700 mL of pleural  fluid. Performed by: Rayfield Buff, NP under the direct supervision of Dr Cordella Banner Electronically Signed   By: Cordella Banner   On: 07/02/2024 16:09   DG Chest Port 1 View Result Date: 07/02/2024 CLINICAL DATA:  Status post thoracentesis EXAM: PORTABLE CHEST 1 VIEW COMPARISON:  Chest x-ray 07/02/2024 FINDINGS: There are small bilateral pleural effusions, decreased on the right. There is no pneumothorax. Cardiomediastinal silhouette is enlarged unchanged. Sternotomy wires and fasteners are again seen. IMPRESSION: Small bilateral pleural effusions, decreased on the right. No pneumothorax. Electronically Signed   By: Greig Pique M.D.   On: 07/02/2024 15:53   DG Chest Port 1 View Result Date: 07/02/2024 EXAM: 1 VIEW(S) XRAY OF THE CHEST 07/02/2024  07:30:00 AM COMPARISON: 07/01/2024 CLINICAL HISTORY: Shortness of breath FINDINGS: LUNGS AND PLEURA: Mild pulmonary edema. Moderate right and small left pleural effusions with associated bibasilar opacities. No pneumothorax. HEART AND MEDIASTINUM: Cardiomegaly, unchanged. Prior CABG and aortic valve replacement noted. BONES AND SOFT TISSUES: No acute osseous abnormality. IMPRESSION: 1. Mild pulmonary edema. 2. Moderate right and small left pleural effusions 3. No change in bilateral pulmonary opacities, which may reflect areas of atelectasis and/or consolidation. Electronically signed by: Waddell Calk MD 07/02/2024 11:40 AM EST RP Workstation: [id number]   DG Chest 2 View Result Date: 07/01/2024 CLINICAL DATA:  Shortness of breath. EXAM: CHEST - 2 VIEW COMPARISON:  03/03/2024. FINDINGS: Cardiomegaly, unchanged. Prior valve replacement and CABG. Median sternotomy. Aortic atherosclerosis. Moderate right and small left pleural effusions with bibasilar opacities, likely reflecting atelectasis. No pneumothorax. No acute osseous abnormality. IMPRESSION: 1. Moderate right and small left pleural effusions with bibasilar atelectasis. 2. Cardiomegaly. Electronically Signed   By: Harrietta Sherry M.D.   On: 07/01/2024 13:26   CARDIAC CATHETERIZATION Result Date: 07/01/2024   Hemodynamic findings consistent with severe pulmonary hypertension. 1.   RA 15/15 mmHg  RV 87/21  PA 86/42  (51)  PCWP 45/24  (32) 2.  Severe pulmonary hypertension         Scheduled Meds:  amLODipine   10 mg Oral Daily   aspirin  EC  81 mg Oral Daily   atorvastatin   80 mg Oral Daily   carvedilol   3.125 mg Oral BID WC   clopidogrel   75 mg Oral Daily   cyanocobalamin   1,000 mcg Oral Daily   empagliflozin   10 mg Oral QAC breakfast   ferrous sulfate   325 mg Oral Daily   furosemide   60 mg Intravenous BID   heparin   5,000 Units Subcutaneous Q8H   isosorbide  mononitrate  120 mg Oral Daily   pantoprazole   40 mg Oral Daily   sodium  chloride flush  3 mL Intravenous Q12H   Continuous Infusions:   LOS: 1 day    Time spent: 35 mins    Meaghan Whistler Maree, MD [facility] Pager [phone]  If 7PM-7AM, please contact night-coverage [email or web address]  07/02/2024, 9:35 PM

## 2024-07-02 NOTE — Plan of Care (Signed)
   Problem: Education: Goal: Knowledge of General Education information will improve Description Including pain rating scale, medication(s)/side effects and non-pharmacologic comfort measures Outcome: Progressing

## 2024-07-02 NOTE — Procedures (Signed)
 PROCEDURE SUMMARY:  Successful image-guided diagnostic and therapeutic thoracentesis from the right chest.  Yielded 700 mL of amber fluid.  No immediate complications.  EBL: zero Patient tolerated well.   Specimen sent for labs.  Post-procedure CXR ordered.   Please see imaging section of Epic for full dictation.  Dayanara Sherrill B Abdulrahman Bracey NP 07/02/2024 2:59 PM

## 2024-07-02 NOTE — Plan of Care (Signed)

## 2024-07-02 NOTE — TOC CM/SW Note (Signed)
 CSW went by room for readmission prevention screen but patient was off the unit. Will try again later.  Lauraine Carpen, CSW [phone]

## 2024-07-03 ENCOUNTER — Inpatient Hospital Stay

## 2024-07-03 ENCOUNTER — Telehealth: Payer: Self-pay | Admitting: Oncology

## 2024-07-03 DIAGNOSIS — I509 Heart failure, unspecified: Secondary | ICD-10-CM | POA: Diagnosis not present

## 2024-07-03 DIAGNOSIS — I480 Paroxysmal atrial fibrillation: Secondary | ICD-10-CM | POA: Diagnosis not present

## 2024-07-03 DIAGNOSIS — E1169 Type 2 diabetes mellitus with other specified complication: Secondary | ICD-10-CM | POA: Diagnosis not present

## 2024-07-03 DIAGNOSIS — I10 Essential (primary) hypertension: Secondary | ICD-10-CM | POA: Diagnosis not present

## 2024-07-03 LAB — CBC
HCT: 27.4 % — ABNORMAL LOW (ref 39.0–52.0)
Hemoglobin: 8.6 g/dL — ABNORMAL LOW (ref 13.0–17.0)
MCH: 30.4 pg (ref 26.0–34.0)
MCHC: 31.4 g/dL (ref 30.0–36.0)
MCV: 96.8 fL (ref 80.0–100.0)
Platelets: 151 K/uL (ref 150–400)
RBC: 2.83 MIL/uL — ABNORMAL LOW (ref 4.22–5.81)
RDW: 19.9 % — ABNORMAL HIGH (ref 11.5–15.5)
WBC: 6.9 K/uL (ref 4.0–10.5)
nRBC: 0 % (ref 0.0–0.2)

## 2024-07-03 LAB — BASIC METABOLIC PANEL WITH GFR
Anion gap: 9 (ref 5–15)
BUN: 34 mg/dL — ABNORMAL HIGH (ref 8–23)
CO2: 30 mmol/L (ref 22–32)
Calcium: 10 mg/dL (ref 8.9–10.3)
Chloride: 98 mmol/L (ref 98–111)
Creatinine, Ser: 1.95 mg/dL — ABNORMAL HIGH (ref 0.61–1.24)
GFR, Estimated: 35 mL/min — ABNORMAL LOW (ref >60)
Glucose, Bld: 149 mg/dL — ABNORMAL HIGH (ref 70–99)
Potassium: 4 mmol/L (ref 3.5–5.1)
Sodium: 137 mmol/L (ref 135–145)

## 2024-07-03 LAB — LACTATE DEHYDROGENASE, PLEURAL OR PERITONEAL FLUID: LD, Fluid: 246 U/L — ABNORMAL HIGH (ref 3–23)

## 2024-07-03 LAB — GLUCOSE, PLEURAL OR PERITONEAL FLUID: Glucose, Fluid: 137 mg/dL

## 2024-07-03 LAB — PROTEIN, PLEURAL OR PERITONEAL FLUID: Total protein, fluid: <3 g/dL

## 2024-07-03 LAB — ACID FAST SMEAR (AFB, MYCOBACTERIA): Acid Fast Smear: NEGATIVE

## 2024-07-03 LAB — GLUCOSE, CAPILLARY
Glucose-Capillary: 138 mg/dL — ABNORMAL HIGH (ref 70–99)
Glucose-Capillary: 183 mg/dL — ABNORMAL HIGH (ref 70–99)
Glucose-Capillary: 158 mg/dL — ABNORMAL HIGH (ref 70–99)

## 2024-07-03 NOTE — Progress Notes (Signed)
 PROGRESS NOTE    [name]  [name] DOB: 19-Dec-1945 DOA: 07/01/2024 PCP: Pcp, No   Brief Narrative:   78 y.o. year old male with medical history of hypertension, hyperlipidemia, type 2 diabetes, CAD status post CABG, CHF, paroxysmal A-fib, OSA presenting from Cath Lab given he was hypoxic post cath   11/20: cardio c/s, US  guided Thora, 1 PRBC transfusion 11/21: Palliative care c/s, DC tele, transfer to med-surg   Assessment & Plan:   Principal Problem:   Acute exacerbation of CHF (congestive heart failure) (HCC) Active Problems:   Paroxysmal atrial fibrillation (HCC)   T2DM (type 2 diabetes mellitus) (HCC)   Essential hypertension   OSA (obstructive sleep apnea)   S/P CABG x 3   Hyperlipidemia associated with type 2 diabetes mellitus (HCC)  Acute hypoxic respi failure Needing 3 liters O2 via [place], dropped O2 in 80s while working with OT earlier but back up in mid 90s with some rest  Acute on chronic diastolic CHF exacerbation Coronary artery disease s/p CABG Demand ischemia 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  Chest x-ray with signs of volume overload.   - Cardiology seen  - Continue IV Lasix  60 mg bid  Will monitor strict input and output, daily weights.   - Cath also showed pulmonary hypertension and patient has OSA and states he is not adherent to his CPAP.  States his sleep quality is poor given he has to wake up multiple times to urinate given his diuretic. Left lower extremity swelling: - US /Doppler neg for DVT - continue asa, plavix    Rt pleural effusion US  guided thora with removal of 700 cc of fluid  Anemia of CKD Hb 7.2 -> 8.6 s/p 1 PRBC transfusion  Hypertension: Continue norvasc , coreg , imdur  Hyperlipidemia: Continue lipitor  Type 2 diabetes: Monitor CBGs.  Continue home SGLT2 inhibitor.  GOC - Palliative care c/s   DVT prophylaxis:  Heparin  heparin  injection 5,000 Units Start: 07/02/24 0600     Code Status: Full code Family Communication: none at bedside Disposition Plan: possible DC in 2-3 days depending on clinical condition and cardio w/up   Consultants:  Cardio  Procedures:  US  guided thora     Subjective:  SOB improving. Feeling some better, wants to make sure he doesn't get DCed too soon and hopeful to have some answer with his meeting at [employer] f/up  Objective: Vitals:   07/03/24 0500 07/03/24 0851 07/03/24 1659 07/03/24 2019  BP:  (!) 134/55 (!) 126/55 (!) 120/46  Pulse:  (!) 55 70 62  Resp:  16 16 18   Temp:  98.2 F (36.8 C) 98.2 F (36.8 C) 98.3 F (36.8 C)  TempSrc:    Oral  SpO2:  100% 96% 97%  Weight: 83.1 kg     Height:        Intake/Output Summary (Last 24 hours) at 07/03/2024 2048 Last data filed at 07/03/2024 1947 Gross per 24 hour  Intake 1320 ml  Output 2400 ml  Net -1080 ml   Filed Weights   07/01/24 2330 07/02/24 0500 07/03/24 0500  Weight: 84.8 kg 84.6 kg 83.1 kg    Examination:  General exam: Appears calm and comfortable, chronically ill looking Respiratory system: decreased BS at bases Cardiovascular system: S1 & S2 heard, RRR. No murmurs. No pedal edema. Gastrointestinal system: Abdomen is soft, benign Central nervous system: Alert and oriented. No focal neurological deficits. Extremities: Symmetric 5  x 5 power. Skin: No rashes, lesions or ulcers Psychiatry: Judgement and insight appear normal. Mood & affect appropriate.     Data Reviewed: I have personally reviewed following labs and imaging studies  CBC: Recent Labs  Lab 07/01/24 0711 07/01/24 0809 07/01/24 1220 07/02/24 0429 07/03/24 0545  WBC 8.7  --  6.7 5.7 6.9  HGB 8.5* 8.2* 8.4* 7.2* 8.6*  HCT 27.1* 24.0* 27.0* 22.5* 27.4*  MCV 98.2  --  98.5 97.4 96.8  PLT 181  --  156 138* 151   Basic Metabolic Panel: Recent Labs  Lab 07/01/24 0809 07/01/24 1220 07/01/24 1637 07/02/24 0429  07/03/24 0545  NA 136 135  --  137 137  K 4.3 4.6  --  4.1 4.0  CL  --  98  --  100 98  CO2  --  24  --  30 30  GLUCOSE  --  158*  --  144* 149*  BUN  --  30*  --  32* 34*  CREATININE  --  1.98*  --  2.00* 1.95*  CALCIUM   --  10.5*  --  9.9 10.0  MG  --   --  2.3  --   --    BNP (last 3 results) Recent Labs    07/01/24 1220  PROBNP 12,167.0*   HbA1C: No results for input(s): HGBA1C in the last 72 hours. CBG: Recent Labs  Lab 07/02/24 1205 07/02/24 1641 07/02/24 2133 07/03/24 0851 07/03/24 1700  GLUCAP 189* 164* 164* 138* 183*    Radiology Studies: US  Venous Img Lower Unilateral Left (DVT) Result Date: 07/02/2024 CLINICAL DATA:  Swelling. EXAM: LEFT LOWER EXTREMITY VENOUS DOPPLER ULTRASOUND TECHNIQUE: Gray-scale sonography with graded compression, as well as color Doppler and duplex ultrasound were performed to evaluate the lower extremity deep venous systems from the level of the common femoral vein and including the common femoral, femoral, profunda femoral, popliteal and calf veins including the posterior tibial, peroneal and gastrocnemius veins when visible. Spectral Doppler was utilized to evaluate flow at rest and with distal augmentation maneuvers in the common femoral, femoral and popliteal veins. COMPARISON:  12/20/2022 FINDINGS: Contralateral Common Femoral Vein: Respiratory phasicity is normal and symmetric with the symptomatic side. No evidence of thrombus. Normal compressibility. Common Femoral Vein: No evidence of thrombus. Normal compressibility, respiratory phasicity and response to augmentation. Saphenofemoral Junction: No evidence of thrombus. Normal compressibility and flow on color Doppler imaging. Profunda Femoral Vein: No evidence of thrombus. Normal compressibility and flow on color Doppler imaging. Femoral Vein: No evidence of thrombus. Normal compressibility, respiratory phasicity and response to augmentation. Popliteal Vein: No evidence of thrombus. Normal  compressibility, respiratory phasicity and response to augmentation. Calf Veins: No evidence of thrombus. Normal compressibility and flow on color Doppler imaging. Other Findings:  None. IMPRESSION: Negative for deep venous thrombosis in left lower extremity. Electronically Signed   By: Juliene Balder M.D.   On: 07/02/2024 17:01   US  THORACENTESIS ASP PLEURAL SPACE W/IMG GUIDE Result Date: 07/02/2024 INDICATION: 78 year old male presents with dyspnea and right-sided pleural effusion. Received request for diagnostic and therapeutic thoracentesis. EXAM: ULTRASOUND GUIDED DIAGNOSTIC AND THERAPEUTIC, RIGHT-SIDED THORACENTESIS MEDICATIONS: 10 mL 1% lidocaine  COMPLICATIONS: None immediate. PROCEDURE: An ultrasound guided thoracentesis was thoroughly discussed with the patient and questions answered. The benefits, risks, alternatives and complications were also discussed. The patient understands and wishes to proceed with the procedure. Written consent was obtained. Ultrasound was performed to localize and mark an adequate pocket of fluid in the  right chest. The area was then prepped and draped in the normal sterile fashion. 1% lidocaine  was used for local anesthesia. Under ultrasound guidance a 6 Fr Safe-T-Centesis catheter was introduced. Thoracentesis was performed. The catheter was removed and a dressing applied. FINDINGS: A total of approximately 700 mL of amber fluid was removed. Samples were sent to the laboratory as requested by the clinical team. IMPRESSION: Successful ultrasound guided right thoracentesis yielding 700 mL of pleural fluid. Performed by: Rayfield Buff, NP under the direct supervision of Dr Cordella Banner Electronically Signed   By: Cordella Banner   On: 07/02/2024 16:09   DG Chest Port 1 View Result Date: 07/02/2024 CLINICAL DATA:  Status post thoracentesis EXAM: PORTABLE CHEST 1 VIEW COMPARISON:  Chest x-ray 07/02/2024 FINDINGS: There are small bilateral pleural effusions, decreased on  the right. There is no pneumothorax. Cardiomediastinal silhouette is enlarged unchanged. Sternotomy wires and fasteners are again seen. IMPRESSION: Small bilateral pleural effusions, decreased on the right. No pneumothorax. Electronically Signed   By: Greig Pique M.D.   On: 07/02/2024 15:53   DG Chest Port 1 View Result Date: 07/02/2024 EXAM: 1 VIEW(S) XRAY OF THE CHEST 07/02/2024 07:30:00 AM COMPARISON: 07/01/2024 CLINICAL HISTORY: Shortness of breath FINDINGS: LUNGS AND PLEURA: Mild pulmonary edema. Moderate right and small left pleural effusions with associated bibasilar opacities. No pneumothorax. HEART AND MEDIASTINUM: Cardiomegaly, unchanged. Prior CABG and aortic valve replacement noted. BONES AND SOFT TISSUES: No acute osseous abnormality. IMPRESSION: 1. Mild pulmonary edema. 2. Moderate right and small left pleural effusions 3. No change in bilateral pulmonary opacities, which may reflect areas of atelectasis and/or consolidation. Electronically signed by: Taylor Stroud MD 07/02/2024 11:40 AM EST RP Workstation: [id number]        Scheduled Meds:  amLODipine   10 mg Oral Daily   aspirin  EC  81 mg Oral Daily   atorvastatin   80 mg Oral Daily   carvedilol   3.125 mg Oral BID WC   clopidogrel   75 mg Oral Daily   cyanocobalamin   1,000 mcg Oral Daily   empagliflozin   10 mg Oral QAC breakfast   ferrous sulfate   325 mg Oral Daily   furosemide   60 mg Intravenous BID   heparin   5,000 Units Subcutaneous Q8H   isosorbide  mononitrate  120 mg Oral Daily   pantoprazole   40 mg Oral Daily   sodium chloride  flush  3 mL Intravenous Q12H   Continuous Infusions:   LOS: 2 days    Time spent: 35 mins    Zellie Jenning Maree, MD [facility] Pager [phone]  If 7PM-7AM, please contact night-coverage [email or web address]  07/03/2024, 8:48 PM

## 2024-07-03 NOTE — TOC Initial Note (Signed)
 Transition of Care [facility]) - Initial/Assessment Note    Patient Details  Name: [name] MRN: [id number] Date of Birth: [birth year]  Transition of Care [facility]) CM/SW Contact:    Jerry Rummer, LCSW Phone Number: 07/03/2024, 3:33 PM  Clinical Narrative:                  LCSW A. Fitzgerald completed initial visit with [name] at bedside in room 256. [name] report he lives with family and has adequate support at home. [name] reports his new pcp is Dr Sadie and [facility] [place]. [name] reports he has adequate transportation to attend his medical appointments.        Patient Goals and CMS Choice            Expected Discharge Plan and Services        To be determined.                                       Prior Living Arrangements/Services                       Activities of Daily Living   ADL Screening (condition at time of admission) Independently performs ADLs?: Yes (appropriate for developmental age) Is the patient deaf or have difficulty hearing?: No Does the patient have difficulty seeing, even when wearing glasses/contacts?: No Does the patient have difficulty concentrating, remembering, or making decisions?: No  Permission Sought/Granted                  Emotional Assessment              Admission diagnosis:  Acute exacerbation of CHF (congestive heart failure) (HCC) [I50.9] Dyspnea, unspecified type [R06.00] Acute on chronic congestive heart failure, unspecified heart failure type (HCC) [I50.9] Patient Active Problem List   Diagnosis Date Noted   Anemia in chronic kidney disease 04/14/2024   Other specified anemias 03/27/2024   Chest pain 03/02/2024   Diabetes mellitus type 2, noninsulin dependent (HCC) 03/02/2024   CKD (chronic kidney disease), stage IV (HCC) 03/02/2024   Respiratory arrest (HCC) 03/02/2024   Unresponsiveness 03/02/2024   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/2024    IDA (iron  deficiency anemia) 12/16/2023   Left ventricular hypertrophy 12/16/2023   Thrombocytopenia 12/16/2023   Stage 4 chronic kidney disease (HCC) 12/16/2023   Anemia of chronic disease 12/16/2023   Pleural effusion, bilateral 12/14/2023   Chronic diastolic CHF (congestive heart failure) (HCC) 11/12/2023   Murmur, cardiac 11/12/2023   Polyp of transverse colon 10/19/2023   Gastric polyps 10/19/2023   Acute exacerbation of CHF (congestive heart failure) (HCC) 10/16/2023   Severe anemia 10/16/2023   Dyspnea on exertion 10/16/2023   Personal history of other malignant neoplasm of skin 10/16/2023   Mitral valve regurgitation 09/15/2023   Melena 08/26/2023   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   CHF exacerbation (HCC) 08/22/2023   Upper GI bleed 08/22/2023   Recurrent right pleural effusion 08/22/2023   Symptomatic anemia 08/22/2023   Status post Mohs surgery 05/01/2023   Right inguinal hernia 02/19/2023   Umbilical hernia without obstruction and without gangrene 02/19/2023   Hyponatremia 12/26/2022   Acute kidney injury superimposed on chronic kidney disease 12/26/2022   Severe pulmonary hypertension (HCC) 12/26/2022   Vitamin D  deficiency 12/26/2022   Acute respiratory failure with hypoxia (  HCC) 12/26/2022   Severe mitral insufficiency 12/26/2022   Severe tricuspid regurgitation 12/26/2022   Acute heart failure with preserved ejection fraction (HFpEF) (HCC) 12/20/2022   Chronic venous stasis 12/20/2022   Paroxysmal atrial fibrillation (HCC) 12/20/2022   Iron  deficiency anemia 05/17/2021   Thyroid  nodule 11/04/2020   Lymphedema 10/27/2020   Gastric mass    Polyp of sigmoid colon    Acute blood loss anemia 09/04/2020   COVID-19 virus infection    Hypercalcemia    Pica    Acute on chronic blood loss anemia 09/02/2020   Hyperlipidemia associated with type 2 diabetes mellitus (HCC) 09/02/2020   S/P drug eluting coronary stent placement 06/09/2020   Other  hyperlipidemia 08/18/2019   T2DM (type 2 diabetes mellitus) (HCC) 01/14/2017   Essential hypertension 01/14/2017   OSA (obstructive sleep apnea) 01/14/2017   At risk for fluid volume overload 09/08/2016   BBB (bundle branch block) 08/30/2016   Obesity with body mass index (BMI) of 30.0 to 39.9 08/30/2016   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 [place] [address] Phone: [phone] Fax: [phone]  [facility] [address] [place] [address] Phone: [phone] Fax: [phone]     Social Drivers of Health (SDOH) Social History: SDOH Screenings   Food Insecurity: No Food Insecurity (07/01/2024)  Housing: Low Risk  (07/01/2024)  Transportation Needs: No Transportation Needs (07/01/2024)  Utilities: Not At Risk (07/01/2024)  Depression (PHQ2-9): Low Risk  (06/26/2024)  Financial Resource Strain: Low Risk  (05/08/2024)   Received from [facility]  Social Connections: Moderately Isolated (07/01/2024)  Tobacco Use: Medium Risk (07/01/2024)   SDOH Interventions:     Readmission Risk Interventions     No data to display

## 2024-07-03 NOTE — Progress Notes (Signed)
 [facility] Cardiology    SUBJECTIVE: Persistent dyspnea shortness of breath denies any chest pain persistent lower extremity edema denies any palpitations or tachycardia.  Status post thoracentesis with significant improvement in symptoms   Vitals:   07/03/24 0422 07/03/24 0500 07/03/24 0851 07/03/24 1659  BP: (!) 138/51  (!) 134/55 (!) 126/55  Pulse: 62  (!) 55 70  Resp: 19  16 16   Temp: 97.9 F (36.6 C)  98.2 F (36.8 C) 98.2 F (36.8 C)  TempSrc:      SpO2: 100%  100% 96%  Weight:  83.1 kg    Height:         Intake/Output Summary (Last 24 hours) at 07/03/2024 1932 Last data filed at 07/03/2024 1919 Gross per 24 hour  Intake 1117.5 ml  Output 1650 ml  Net -532.5 ml      PHYSICAL EXAM  General: Well developed, well nourished, in no acute distress HEENT:  Normocephalic and atramatic Neck:  No JVD.  Lungs: Clear bilaterally to auscultation and percussion. Heart: HRRR . Normal S1 and S2 without gallops or 2/6 sem murmurs.  Abdomen: Bowel sounds are positive, abdomen soft and non-tender  Msk:  Back normal, normal gait. Normal strength and tone for age. Extremities: No clubbing, cyanosis or 3+ edema.   Neuro: Alert and oriented X 3. Psych:  Good affect, responds appropriately   LABS: Basic Metabolic Panel: Recent Labs    07/01/24 1637 07/02/24 0429 07/03/24 0545  NA  --  137 137  K  --  4.1 4.0  CL  --  100 98  CO2  --  30 30  GLUCOSE  --  144* 149*  BUN  --  32* 34*  CREATININE  --  2.00* 1.95*  CALCIUM   --  9.9 10.0  MG 2.3  --   --    Liver Function Tests: No results for input(s): AST, ALT, ALKPHOS, BILITOT, PROT, ALBUMIN in the last 72 hours. No results for input(s): LIPASE, AMYLASE in the last 72 hours. CBC: Recent Labs    07/02/24 0429 07/03/24 0545  WBC 5.7 6.9  HGB 7.2* 8.6*  HCT 22.5* 27.4*  MCV 97.4 96.8  PLT 138* 151   Cardiac Enzymes: No results for input(s): CKTOTAL, CKMB, CKMBINDEX, TROPONINI in the last 72  hours. BNP: Invalid input(s): POCBNP D-Dimer: No results for input(s): DDIMER in the last 72 hours. Hemoglobin A1C: No results for input(s): HGBA1C in the last 72 hours. Fasting Lipid Panel: No results for input(s): CHOL, HDL, LDLCALC, TRIG, CHOLHDL, LDLDIRECT in the last 72 hours. Thyroid  Function Tests: No results for input(s): TSH, T4TOTAL, T3FREE, THYROIDAB in the last 72 hours.  Invalid input(s): FREET3 Anemia Panel: No results for input(s): VITAMINB12, FOLATE, FERRITIN, TIBC, IRON , RETICCTPCT in the last 72 hours.  US  Venous Img Lower Unilateral Left (DVT) Result Date: 07/02/2024 CLINICAL DATA:  Swelling. EXAM: LEFT LOWER EXTREMITY VENOUS DOPPLER ULTRASOUND TECHNIQUE: Gray-scale sonography with graded compression, as well as color Doppler and duplex ultrasound were performed to evaluate the lower extremity deep venous systems from the level of the common femoral vein and including the common femoral, femoral, profunda femoral, popliteal and calf veins including the posterior tibial, peroneal and gastrocnemius veins when visible. Spectral Doppler was utilized to evaluate flow at rest and with distal augmentation maneuvers in the common femoral, femoral and popliteal veins. COMPARISON:  12/20/2022 FINDINGS: Contralateral Common Femoral Vein: Respiratory phasicity is normal and symmetric with the symptomatic side. No evidence of thrombus. Normal compressibility. Common Femoral  Vein: No evidence of thrombus. Normal compressibility, respiratory phasicity and response to augmentation. Saphenofemoral Junction: No evidence of thrombus. Normal compressibility and flow on color Doppler imaging. Profunda Femoral Vein: No evidence of thrombus. Normal compressibility and flow on color Doppler imaging. Femoral Vein: No evidence of thrombus. Normal compressibility, respiratory phasicity and response to augmentation. Popliteal Vein: No evidence of thrombus. Normal  compressibility, respiratory phasicity and response to augmentation. Calf Veins: No evidence of thrombus. Normal compressibility and flow on color Doppler imaging. Other Findings:  None. IMPRESSION: Negative for deep venous thrombosis in left lower extremity. Electronically Signed   By: Juliene Balder M.D.   On: 07/02/2024 17:01   US  THORACENTESIS ASP PLEURAL SPACE W/IMG GUIDE Result Date: 07/02/2024 INDICATION: 78 year old male presents with dyspnea and right-sided pleural effusion. Received request for diagnostic and therapeutic thoracentesis. EXAM: ULTRASOUND GUIDED DIAGNOSTIC AND THERAPEUTIC, RIGHT-SIDED THORACENTESIS MEDICATIONS: 10 mL 1% lidocaine  COMPLICATIONS: None immediate. PROCEDURE: An ultrasound guided thoracentesis was thoroughly discussed with the patient and questions answered. The benefits, risks, alternatives and complications were also discussed. The patient understands and wishes to proceed with the procedure. Written consent was obtained. Ultrasound was performed to localize and mark an adequate pocket of fluid in the right chest. The area was then prepped and draped in the normal sterile fashion. 1% lidocaine  was used for local anesthesia. Under ultrasound guidance a 6 Fr Safe-T-Centesis catheter was introduced. Thoracentesis was performed. The catheter was removed and a dressing applied. FINDINGS: A total of approximately 700 mL of amber fluid was removed. Samples were sent to the laboratory as requested by the clinical team. IMPRESSION: Successful ultrasound guided right thoracentesis yielding 700 mL of pleural fluid. Performed by: Rayfield Buff, NP under the direct supervision of Dr Cordella Banner Electronically Signed   By: Cordella Banner   On: 07/02/2024 16:09   DG Chest Port 1 View Result Date: 07/02/2024 CLINICAL DATA:  Status post thoracentesis EXAM: PORTABLE CHEST 1 VIEW COMPARISON:  Chest x-ray 07/02/2024 FINDINGS: There are small bilateral pleural effusions, decreased on  the right. There is no pneumothorax. Cardiomediastinal silhouette is enlarged unchanged. Sternotomy wires and fasteners are again seen. IMPRESSION: Small bilateral pleural effusions, decreased on the right. No pneumothorax. Electronically Signed   By: Greig Pique M.D.   On: 07/02/2024 15:53   DG Chest Port 1 View Result Date: 07/02/2024 EXAM: 1 VIEW(S) XRAY OF THE CHEST 07/02/2024 07:30:00 AM COMPARISON: 07/01/2024 CLINICAL HISTORY: Shortness of breath FINDINGS: LUNGS AND PLEURA: Mild pulmonary edema. Moderate right and small left pleural effusions with associated bibasilar opacities. No pneumothorax. HEART AND MEDIASTINUM: Cardiomegaly, unchanged. Prior CABG and aortic valve replacement noted. BONES AND SOFT TISSUES: No acute osseous abnormality. IMPRESSION: 1. Mild pulmonary edema. 2. Moderate right and small left pleural effusions 3. No change in bilateral pulmonary opacities, which may reflect areas of atelectasis and/or consolidation. Electronically signed by: Waddell Calk MD 07/02/2024 11:40 AM EST RP Workstation: [id number]     Echo preserved left ventricular function EF around 55% significant pulmonary hypertension moderate to severe  TELEMETRY: Sinus rhythm rate of 70 diffuse nonspecific ST-T wave changes:  ASSESSMENT AND PLAN:  Principal Problem:   Acute exacerbation of CHF (congestive heart failure) (HCC) Active Problems:   T2DM (type 2 diabetes mellitus) (HCC)   Essential hypertension   OSA (obstructive sleep apnea)   S/P CABG x 3   Hyperlipidemia associated with type 2 diabetes mellitus (HCC)   Paroxysmal atrial fibrillation (HCC)       Plan: Persistent dyspnea  shortness of breath agree with thoracentesis for bilateral pleural effusions Postop right heart cath evidence of significant pulm hypertension status post right heart catheter with evidence of pulmonary hypertension continue aggressive diuresis follow-up with pulmonary Previous aortic valve therapy history of TAVR  for aortic stenosis continue current therapy Acute on chronic HFpEF continue diuresis with Lasix  Continue statin therapy for hyperlipidemia GDMT for heart failure Coreg  Jardiance  Lasix  for heart failure management      Cara JONETTA Lovelace, MD 07/03/2024 7:32 PM

## 2024-07-03 NOTE — Evaluation (Signed)
 Physical Therapy Evaluation Patient Details Name: [name] MRN: [id number] DOB: Dec 13, 1945 Today's Date: 07/03/2024  History of Present Illness  Pt is a 78 y/o M admitted on 07/01/24. PT had R heart cath & was brought to ED for further evaluation of SOB s/p procedure. Pt noted to have pleural effusion, s/p thoracentesis on 07/02/24. Pt is being treated for acute hypoxic respiratory failure, acute on chronic diastolic CHF exacerbation. PMH: HTN, HLD, DM2, CAD s/p CABG, CHF, paroxysmal a-fib, OSA  Clinical Impression  Pt seen for PT evaluation with pt agreeable to tx. Pt reports prior to admission he was independent without AD. On this date, pt is able to ambulate in room without AD without LOB but limited by SpO2 reading as low as 80% after 2 out of 4 gait trials, SPO2 quickly increased to >/= 90% with seated rest & pursed lip breathing. Recommend ongoing PT service to progress mobility as able.        If plan is discharge home, recommend the following: Assistance with cooking/housework;Assist for transportation   Can travel by private vehicle        Equipment Recommendations None recommended by PT  Recommendations for Other Services       Functional Status Assessment Patient has had a recent decline in their functional status and demonstrates the ability to make significant improvements in function in a reasonable and predictable amount of time.     Precautions / Restrictions Precautions Precautions: None Precaution/Restrictions Comments: monitor O2 Restrictions Weight Bearing Restrictions Per Provider Order: No      Mobility  Bed Mobility               General bed mobility comments: not tested, pt received & left sitting in recliner    Transfers Overall transfer level: Modified independent Equipment used: None               General transfer comment: sit<>Stand from recliner    Ambulation/Gait Ambulation/Gait assistance: Supervision Gait Distance  (Feet): 18 Feet (x 4 times) Assistive device: None Gait Pattern/deviations: Step-through pattern Gait velocity: slightly decreased     General Gait Details: min assist for O2 tubing management  Stairs            Wheelchair Mobility     Tilt Bed    Modified Rankin (Stroke Patients Only)       Balance Overall balance assessment: Needs assistance Sitting-balance support: Feet supported Sitting balance-Leahy Scale: Good     Standing balance support: No upper extremity supported, During functional activity Standing balance-Leahy Scale: Good                               Pertinent Vitals/Pain Pain Assessment Pain Assessment: No/denies pain    Home Living Family/patient expects to be discharged to:: Private residence Living Arrangements: Spouse/significant other Available Help at Discharge: Family Type of Home: House Home Access: Stairs to enter Entrance Stairs-Rails: Right;Left;Can reach both Secretary/administrator of Steps: 3   Home Layout: One level Home Equipment: Cane - single point      Prior Function               Mobility Comments: Uses SPC when blood count is low, otherwise independent without AD, denies falls, not on home O2       Extremity/Trunk Assessment   Upper Extremity Assessment Upper Extremity Assessment: Overall WFL for tasks assessed    Lower Extremity Assessment Lower Extremity Assessment:  Overall WFL for tasks assessed       Communication   Communication Communication: No apparent difficulties    Cognition Arousal: Alert Behavior During Therapy: WFL for tasks assessed/performed   PT - Cognitive impairments: No apparent impairments                         Following commands: Intact       Cueing Cueing Techniques: Verbal cues     General Comments General comments (skin integrity, edema, etc.): Pt on 3L/min via nasal cannula throughout session.    Exercises     Assessment/Plan    PT  Assessment Patient needs continued PT services  PT Problem List Cardiopulmonary status limiting activity;Decreased activity tolerance;Decreased balance;Decreased mobility       PT Treatment Interventions DME instruction;Balance training;Gait training;Neuromuscular re-education;Stair training;Functional mobility training;Patient/family education;Therapeutic activities;Therapeutic exercise;Manual techniques    PT Goals (Current goals can be found in the Care Plan section)  Acute Rehab PT Goals Patient Stated Goal: get better PT Goal Formulation: With patient Time For Goal Achievement: 07/17/24 Potential to Achieve Goals: Good    Frequency Min 2X/week     Co-evaluation               AM-PAC PT 6 Clicks Mobility  Outcome Measure Help needed turning from your back to your side while in a flat bed without using bedrails?: None Help needed moving from lying on your back to sitting on the side of a flat bed without using bedrails?: None Help needed moving to and from a bed to a chair (including a wheelchair)?: None Help needed standing up from a chair using your arms (e.g., wheelchair or bedside chair)?: None Help needed to walk in hospital room?: None Help needed climbing 3-5 steps with a railing? : A Little 6 Click Score: 23    End of Session Equipment Utilized During Treatment: Oxygen Activity Tolerance: Patient tolerated treatment well (limited by low O2 with mobility) Patient left: in chair (in handoff to OT)   PT Visit Diagnosis: Other abnormalities of gait and mobility (R26.89)    Time: 8945-8891 PT Time Calculation (min) (ACUTE ONLY): 14 min   Charges:   PT Evaluation $PT Eval Low Complexity: 1 Low   PT General Charges $$ ACUTE PT VISIT: 1 Visit         [name], PT, DPT 07/03/24, 12:36 PM   [name] 07/03/2024, 12:35 PM

## 2024-07-03 NOTE — Telephone Encounter (Signed)
 Duwaine let him know that I am out of town next week and the earliest I can see him is the week after Thanksgiving and you can fit him anywhere

## 2024-07-03 NOTE — Telephone Encounter (Signed)
 Patient is in the hospital- due to be discharged over the weekend. He states he thought he had an appointment with Lauren on Monday. I did tell him that she had a family emergency and had to be out of office, where he hoped to get Bone Biopsy results from 2 weeks ago. He is scheduled for Monday lab/ Tuesday blood- with no follow up after that. Patient is requesting a call to see when he can be given the biopsy results.

## 2024-07-03 NOTE — Consult Note (Signed)
 Consultation Note Date: 07/03/2024 at 1145  Patient Name: [name]  DOB: 1945-08-27  MRN: [id number]  Age / Sex: 78 y.o., male  PCP: Pcp, No Referring Physician: Maree Hue, MD  HPI/Patient Profile: 78 y.o. male  with past medical history of hypertension, hyperlipidemia, type 2 diabetes, CAD status post CABG, CHF, paroxysmal A-fib, OSA  admitted on 11/192/2025 from the cath lab due to hypoxia post right heart catheterization.   Patient is being treated for acute exacerbation of CHF, acute hypoxic respiratory failure, right pleural effusion, and anemia of CKD.  Cardiology has been consulted.  On 11/20, patient underwent ultrasound-guided thoracentesis with removal of 700 mL.  Patient is also received 1 unit of PRBC.  PMT was consulted to support patient with goals of care discussions.  Clinical Assessment and Goals of Care: Extensive chart review completed prior to meeting patient including labs, vital signs, imaging, progress notes, orders, and available advanced directive documents from current and previous encounters. I then met with patient at bedside to discuss diagnosis prognosis, GOC, EOL wishes, disposition and options.  I introduced Palliative Medicine as specialized medical care for people living with serious illness. It focuses on providing relief from the symptoms and stress of a serious illness. The goal is to improve quality of life for both the patient and the family.  We discussed a brief life review of the patient.  Patient is married and has 1 son as well as twin grandchildren (1 boy 1 girl).  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  He retired in 2003 and spent some time working part-time at [employer] and [employer] as well as helping to take care of his grandchildren.  As far as functional and nutritional status patient endorses that  he feels well just after receiving a blood transfusion.  He shares that he has been able to go for 2 to 3 weeks without difficulty ambulating due to shortness of breath and weakness.  However, he shares he feels his chest tightness and increased pain more frequently now and recognizes this is a sign of requiring more transfusions.  Symptoms assessed.  He has no acute complaints of chest pain, shortness of breath, or other issues at this time.  He endorses that he knows his body and cannot tell when his blood starts to get low because he gets a specific type of chest pain.  He denies having any of those ailments at this time.  No adjustment to [facility] needed at this time.  Reviewed Dr. Darold recommendations for bone marrow biopsy.  Those results are negative and not revealing any abnormalities to indicate reason for continued drop in hemoglobin. He shares he is hopeful to meet with Dr. Melanee sooner rather than later to discuss next steps in treatment plan.  He also shares that he is hopeful that they continue blood transfusions as they make him better immediately. He endorses though that he is having to receive them more often than  before ( previously every 2-3 weeks and now it feels as though it is every 5-6 days).  We discussed patient's current illness and what it means in the larger context of patient's on-going co-morbidities. Discussed patient's ongoing medical management of CHF, OSA, type 2 diabetes, HTN, and HLD.   I attempted to elicit values and goals of care important to the patient.  He shares that he knows he has been through a lot and that his arc is weak but he is hopeful that he can continue to be active at home without any sort of continued hospitalizations.  He accepts that he will likely need continued transfusions or alternative methods to address his anemia.  Advance directives, concepts specific to code status, artificial feeding and hydration, and rehospitalization were considered and  discussed.  He shares that he and his wife have had extensive discussions in regards to his wishes.  He is accepting of ventilatory support in the event that he is unable to protect his airway or has hypoxic respiratory failure.  Additionally, he is accepting of CPR and use of ACLS protocol in the event that he suffers a cardiopulmonary arrest.    However, he is very clear that if he is unable to live off of machines he would never want to live on them long-term.  He is adamant that he would never be accepting of tracheostomy or PEG tube placement.  Full code and full scope remain with understanding that he would never want his life prolonged artificially over the long-term.  Symptom burden is low and well managed at this time.  Goals are clear.  PMT will step back from daily visits and monitor the patient peripherally.  Please re-engage with PMT if goals change, at patient/family's request, or if patient's health deteriorates during hospitalization.    Primary Decision Maker PATIENT  Physical Exam Vitals reviewed.  HENT:     Head: Normocephalic.  Eyes:     Pupils: Pupils are equal, round, and reactive to light.  Pulmonary:     Effort: Pulmonary effort is normal.     Comments: [place] in place Musculoskeletal:     Comments: MAETC  Skin:    General: Skin is warm and dry.     Coloration: Skin is pale.  Neurological:     Mental Status: He is alert and oriented to person, place, and time.  Psychiatric:        Mood and Affect: Mood normal.        Behavior: Behavior normal. Behavior is not agitated.     Palliative Assessment/Data: 60%     Thank you for this consult. Palliative medicine will continue to follow and assist holistically.   75 minute visit includes: Detailed review of medical records (labs, imaging, vital signs), medically appropriate exam (mental status, respiratory, cardiac, skin), discussed with treatment team, counseling and educating patient, family and staff,  documenting clinical information, medication management and coordination of care.  Signed by: Lamarr Gunner, DNP, FNP-BC Palliative Medicine   Please contact Palliative Medicine Team providers via [facility] for questions and concerns.

## 2024-07-03 NOTE — Evaluation (Signed)
 Occupational Therapy Evaluation Patient Details Name: [name] MRN: [id number] DOB: 1946-06-22 Today's Date: 07/03/2024   History of Present Illness   Pt is a 78 y/o M admitted on 07/01/24. PT had R heart cath & was brought to ED for further evaluation of SOB s/p procedure. Pt noted to have pleural effusion, s/p thoracentesis on 07/02/24. Pt is being treated for acute hypoxic respiratory failure, acute on chronic diastolic CHF exacerbation. PMH: HTN, HLD, DM2, CAD s/p CABG, CHF, paroxysmal a-fib, OSA     Clinical Impressions Pt was seen for OT evaluation this date. PTA, he resides at home with his wife and is IND at baseline with all tasks, no AD use. Pt presents with deficits in activity tolerance, affecting safe and optimal ADL completion. Pt currently requires supervision for transfers and ambulation without AD use. He was on 3L 02 via [place] at 100%sp02 on entry, placed on RA with mild desat to 95% at rest, ambulated on RA with desat to 90% and returned to 94% with rest. Left on RA with nurse aware. Pt was educated on task modification, pacing, and therapeutic rest breaks to prevent overexertion. He will benefit from ongoing services to educate on ECS for implementation upon return home.  Pt would benefit from skilled OT services to address noted impairments and functional limitations to maximize safety and independence while minimizing future risk of falls, injury, and readmission. Do anticipate the need for follow up OT services upon acute hospital DC.      If plan is discharge home, recommend the following:   A little help with walking and/or transfers;A little help with bathing/dressing/bathroom;Help with stairs or ramp for entrance;Assistance with cooking/housework     Functional Status Assessment   Patient has had a recent decline in their functional status and demonstrates the ability to make significant improvements in function in a reasonable and predictable amount of  time.     Equipment Recommendations   None recommended by OT     Recommendations for Other Services         Precautions/Restrictions   Precautions Precautions: None Precaution/Restrictions Comments: monitor O2 Restrictions Weight Bearing Restrictions Per Provider Order: No     Mobility Bed Mobility               General bed mobility comments: NT, pt in recliner pre/post session    Transfers Overall transfer level: Modified independent Equipment used: None               General transfer comment: able to stand from recliner and ambulate ~20 ft without AD use and supervision      Balance Overall balance assessment: Needs assistance Sitting-balance support: Feet supported Sitting balance-Leahy Scale: Normal     Standing balance support: No upper extremity supported, During functional activity Standing balance-Leahy Scale: Good                             ADL either performed or assessed with clinical judgement   ADL Overall ADL's : Needs assistance/impaired                     Lower Body Dressing: Supervision/safety   Toilet Transfer: Supervision/safety           Functional mobility during ADLs: Supervision/safety       Vision         Perception         Praxis  Pertinent Vitals/Pain Pain Assessment Pain Assessment: No/denies pain     Extremity/Trunk Assessment Upper Extremity Assessment Upper Extremity Assessment: Overall WFL for tasks assessed   Lower Extremity Assessment Lower Extremity Assessment: Overall WFL for tasks assessed       Communication Communication Communication: No apparent difficulties   Cognition Arousal: Alert Behavior During Therapy: WFL for tasks assessed/performed                                 Following commands: Intact       Cueing  General Comments   Cueing Techniques: Verbal cues  intially on 3L with sp02 at 100%, placed on RA and  maintained around 94-95% at rest, dropped to 90% with ambulation with improvement to 94% on rest on RA-notified nurse pt was left on RA   Exercises Other Exercises Other Exercises: Edu on role of OT in acute setting, ECS, pacing, therapeutic rest breaks and task modification, as well as use of 02 during activity as needed. Pt fairly adamant he'd prefer not to go home on 02 despite education provided.   Shoulder Instructions      Home Living Family/patient expects to be discharged to:: Private residence Living Arrangements: Spouse/significant other Available Help at Discharge: Family Type of Home: House Home Access: Stairs to enter Secretary/administrator of Steps: 3 Entrance Stairs-Rails: Right;Left;Can reach both Home Layout: One level               Home Equipment: Cane - single point          Prior Functioning/Environment               Mobility Comments: Uses SPC when blood count is low, otherwise independent without AD, denies falls, not on home O2 ADLs Comments: IND    OT Problem List: Decreased activity tolerance   OT Treatment/Interventions: Self-care/ADL training;Therapeutic exercise;Energy conservation      OT Goals(Current goals can be found in the care plan section)   Acute Rehab OT Goals Patient Stated Goal: go home OT Goal Formulation: With patient Time For Goal Achievement: 07/17/24 Potential to Achieve Goals: Good ADL Goals Pt Will Transfer to Toilet: with modified independence;ambulating Additional ADL Goal #1: Pt will demo implementation of 1 learned ECS during ADL performance 2/2 trials to maximize safety/IND to return to PLOF and prevent overexertion.   OT Frequency:  Min 1X/week    Co-evaluation              AM-PAC OT 6 Clicks Daily Activity     Outcome Measure Help from another person eating meals?: None Help from another person taking care of personal grooming?: None Help from another person toileting, which includes using  toliet, bedpan, or urinal?: A Little Help from another person bathing (including washing, rinsing, drying)?: A Little Help from another person to put on and taking off regular upper body clothing?: None Help from another person to put on and taking off regular lower body clothing?: A Little 6 Click Score: 21   End of Session Nurse Communication: Mobility status  Activity Tolerance: Patient tolerated treatment well Patient left: in chair;with call bell/phone within reach  OT Visit Diagnosis: Other abnormalities of gait and mobility (R26.89)                Time: 8896-8873 OT Time Calculation (min): 23 min Charges:  OT General Charges $OT Visit: 1 Visit OT Evaluation $OT Eval Low Complexity: 1 Low  Gaurav Baldree Chrismon, OTR/L 07/03/24, 1:17 PM  Celene Pippins E Chrismon 07/03/2024, 1:15 PM

## 2024-07-04 DIAGNOSIS — I509 Heart failure, unspecified: Secondary | ICD-10-CM

## 2024-07-04 DIAGNOSIS — E1169 Type 2 diabetes mellitus with other specified complication: Secondary | ICD-10-CM | POA: Diagnosis not present

## 2024-07-04 DIAGNOSIS — I10 Essential (primary) hypertension: Secondary | ICD-10-CM | POA: Diagnosis not present

## 2024-07-04 DIAGNOSIS — I480 Paroxysmal atrial fibrillation: Secondary | ICD-10-CM | POA: Diagnosis not present

## 2024-07-04 LAB — GLUCOSE, CAPILLARY
Glucose-Capillary: 158 mg/dL — ABNORMAL HIGH (ref 70–99)
Glucose-Capillary: 133 mg/dL — ABNORMAL HIGH (ref 70–99)
Glucose-Capillary: 193 mg/dL — ABNORMAL HIGH (ref 70–99)
Glucose-Capillary: 168 mg/dL — ABNORMAL HIGH (ref 70–99)

## 2024-07-04 LAB — CBC
HCT: 25.3 % — ABNORMAL LOW (ref 39.0–52.0)
Hemoglobin: 8 g/dL — ABNORMAL LOW (ref 13.0–17.0)
MCH: 30.3 pg (ref 26.0–34.0)
MCHC: 31.6 g/dL (ref 30.0–36.0)
MCV: 95.8 fL (ref 80.0–100.0)
Platelets: 141 K/uL — ABNORMAL LOW (ref 150–400)
RBC: 2.64 MIL/uL — ABNORMAL LOW (ref 4.22–5.81)
RDW: 19.8 % — ABNORMAL HIGH (ref 11.5–15.5)
WBC: 6.2 K/uL (ref 4.0–10.5)
nRBC: 0 % (ref 0.0–0.2)

## 2024-07-04 LAB — BASIC METABOLIC PANEL WITH GFR
Anion gap: 6 (ref 5–15)
BUN: 40 mg/dL — ABNORMAL HIGH (ref 8–23)
CO2: 33 mmol/L — ABNORMAL HIGH (ref 22–32)
Calcium: 10 mg/dL (ref 8.9–10.3)
Chloride: 97 mmol/L — ABNORMAL LOW (ref 98–111)
Creatinine, Ser: 2.02 mg/dL — ABNORMAL HIGH (ref 0.61–1.24)
GFR, Estimated: 34 mL/min — ABNORMAL LOW (ref >60)
Glucose, Bld: 153 mg/dL — ABNORMAL HIGH (ref 70–99)
Potassium: 4.1 mmol/L (ref 3.5–5.1)
Sodium: 137 mmol/L (ref 135–145)

## 2024-07-04 LAB — PREPARE RBC (CROSSMATCH)

## 2024-07-04 MED ORDER — ENSURE PLUS HIGH PROTEIN PO LIQD
237.0000 mL | Freq: Two times a day (BID) | ORAL | Status: DC
  Administered 2024-07-04 – 2024-07-05 (×3): 237 mL via ORAL

## 2024-07-04 MED ORDER — SODIUM CHLORIDE 0.9% IV SOLUTION: Freq: Once | INTRAVENOUS | Status: AC

## 2024-07-04 NOTE — Progress Notes (Signed)
 SUBJECTIVE: Patient is feeling much better denies any shortness of breath or chest pain at this time   Vitals:   07/04/24 0517 07/04/24 0750 07/04/24 1210 07/04/24 1226  BP:  136/64 138/61 132/61  Pulse:  66 69 (!) 57  Resp:  18 20 18   Temp:  97.9 F (36.6 C) (!) 97.5 F (36.4 C) 97.8 F (36.6 C)  TempSrc:   Oral Oral  SpO2:  93% 94% 94%  Weight: 84.6 kg     Height:        Intake/Output Summary (Last 24 hours) at 07/04/2024 1329 Last data filed at 07/04/2024 0900 Gross per 24 hour  Intake 1800 ml  Output 1800 ml  Net 0 ml    LABS: Basic Metabolic Panel: Recent Labs    07/01/24 1637 07/02/24 0429 07/03/24 0545 07/04/24 0542  NA  --    < > 137 137  K  --    < > 4.0 4.1  CL  --    < > 98 97*  CO2  --    < > 30 33*  GLUCOSE  --    < > 149* 153*  BUN  --    < > 34* 40*  CREATININE  --    < > 1.95* 2.02*  CALCIUM   --    < > 10.0 10.0  MG 2.3  --   --   --    < > = values in this interval not displayed.   Liver Function Tests: No results for input(s): AST, ALT, ALKPHOS, BILITOT, PROT, ALBUMIN in the last 72 hours. No results for input(s): LIPASE, AMYLASE in the last 72 hours. CBC: Recent Labs    07/03/24 0545 07/04/24 0542  WBC 6.9 6.2  HGB 8.6* 8.0*  HCT 27.4* 25.3*  MCV 96.8 95.8  PLT 151 141*   Cardiac Enzymes: No results for input(s): CKTOTAL, CKMB, CKMBINDEX, TROPONINI in the last 72 hours. BNP: Invalid input(s): POCBNP D-Dimer: No results for input(s): DDIMER in the last 72 hours. Hemoglobin A1C: No results for input(s): HGBA1C in the last 72 hours. Fasting Lipid Panel: No results for input(s): CHOL, HDL, LDLCALC, TRIG, CHOLHDL, LDLDIRECT in the last 72 hours. Thyroid  Function Tests: No results for input(s): TSH, T4TOTAL, T3FREE, THYROIDAB in the last 72 hours.  Invalid input(s): FREET3 Anemia Panel: No results for input(s): VITAMINB12, FOLATE, FERRITIN, TIBC, IRON , RETICCTPCT in  the last 72 hours.   PHYSICAL EXAM General: Well developed, well nourished, in no acute distress HEENT:  Normocephalic and atramatic Neck:  No JVD.  Lungs: Clear bilaterally to auscultation and percussion. Heart: HRRR . Normal S1 and S2 without gallops or murmurs.  Abdomen: Bowel sounds are positive, abdomen soft and non-tender  Msk:  Back normal, normal gait. Normal strength and tone for age. Extremities: No clubbing, cyanosis or edema.   Neuro: Alert and oriented X 3. 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  Patient had on right heart cath with severe pulmonary hypertension and also had to have thoracocentesis because of pleural effusion.  Patient has diuresed well with Lasix  IV 60 twice daily and is feeling much better.  Patient is being sent to regular floor and will be getting physical therapy.  Mildly elevated troponin was due to demand ischemia and not ACS.  Patient states that he is feeling much better compared to when  he first came in.   ICD-10-CM   1. Dyspnea, unspecified type  R06.00 Cytology - Non PAP;    Cytology - Non PAP;    Lactate dehydrogenase (pleural or peritoneal fluid)    Lactate dehydrogenase (pleural or peritoneal fluid)    Body fluid cell count with differential    Body fluid cell count with differential    Protein, pleural or peritoneal fluid    Protein, pleural or peritoneal fluid    Fungus Culture With Stain    Fungus Culture With Stain    Culture, Fungus without Smear    Culture, Fungus without Smear    Glucose, pleural or peritoneal fluid    Glucose, pleural or peritoneal fluid    Acid Fast Culture with reflexed sensitivities    Acid Fast Culture with reflexed sensitivities    Acid Fast Smear (AFB)    Acid Fast Smear (AFB)    Body fluid culture w Gram Stain    Body fluid culture w Gram Stain     2. Acute on chronic congestive heart failure, unspecified heart failure type (HCC)  I50.9 Cytology - Non PAP;    Cytology - Non PAP;    Lactate dehydrogenase (pleural or peritoneal fluid)    Lactate dehydrogenase (pleural or peritoneal fluid)    Body fluid cell count with differential    Body fluid cell count with differential    Protein, pleural or peritoneal fluid    Protein, pleural or peritoneal fluid    Fungus Culture With Stain    Fungus Culture With Stain    Culture, Fungus without Smear    Culture, Fungus without Smear    Glucose, pleural or peritoneal fluid    Glucose, pleural or peritoneal fluid    Acid Fast Culture with reflexed sensitivities    Acid Fast Culture with reflexed sensitivities    Acid Fast Smear (AFB)    Acid Fast Smear (AFB)    Body fluid culture w Gram Stain    Body fluid culture w Gram Stain    3. S/P thoracentesis  Z98.890 DG Chest Port 1 View    DG Chest Port 1 View    Cytology - Non PAP;    Cytology - Non PAP;    Lactate dehydrogenase (pleural or peritoneal fluid)    Lactate dehydrogenase (pleural or peritoneal fluid)    Body fluid cell count with differential    Body fluid cell count with differential    Protein, pleural or peritoneal fluid    Protein, pleural or peritoneal fluid    Fungus Culture With Stain    Fungus Culture With Stain    Culture, Fungus without Smear    Culture, Fungus without Smear    Glucose, pleural or peritoneal fluid    Glucose, pleural or peritoneal fluid    Acid Fast Culture with reflexed sensitivities    Acid Fast Culture with reflexed sensitivities    Acid Fast Smear (AFB)    Acid Fast Smear (AFB)    Body fluid culture w Gram Stain    Body fluid culture w Gram Stain      Principal Problem:   Acute exacerbation of CHF (congestive heart failure) (HCC) Active Problems:   T2DM (type 2 diabetes mellitus) (HCC)   Essential hypertension   OSA (obstructive sleep apnea)   S/P CABG x 3   Hyperlipidemia associated  with type 2 diabetes mellitus (HCC)   Paroxysmal atrial fibrillation (HCC)    Denyse Bathe, MD, FACC 07/04/2024 1:29 PM

## 2024-07-04 NOTE — Progress Notes (Signed)
 PROGRESS NOTE    KAMRON VANWYHE  [name] DOB: 03-Apr-1946 DOA: 07/01/2024 PCP: Pcp, No   Brief Narrative:   78 y.o. year old male with medical history of hypertension, hyperlipidemia, type 2 diabetes, CAD status post CABG, CHF, paroxysmal A-fib, OSA presenting from Cath Lab given he was hypoxic post cath   11/20: cardio c/s, US  guided Thora, 1 PRBC transfusion 11/21: Palliative care c/s, DC tele, transfer to med-surg 11/22: 1 PRBC transfusion for hemoglobin of 8.0  Assessment & Plan:   Principal Problem:   Acute exacerbation of CHF (congestive heart failure) (HCC) Active Problems:   Paroxysmal atrial fibrillation (HCC)   T2DM (type 2 diabetes mellitus) (HCC)   Essential hypertension   OSA (obstructive sleep apnea)   S/P CABG x 3   Hyperlipidemia associated with type 2 diabetes mellitus (HCC)  Acute hypoxic respi failure Initially required 3 L oxygen via nasal cannula.  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  Chest x-ray with signs of volume overload.   - Cardiology seen  - Continue IV Lasix  60 mg bid  Will monitor strict input and output, daily weights.   - Cath also showed pulmonary hypertension and patient has OSA and states he is not adherent to his CPAP.  States his sleep quality is poor given he has to wake up multiple times to urinate given his diuretic. Left lower extremity swelling: - US /Doppler neg for DVT - continue asa, plavix    Rt pleural effusion US  guided thora with removal of 700 cc of fluid  Anemia of CKD Hb 7.2 -> 8.6 s/p 1 PRBC transfusion Hemoglobin 8.0 this morning.  Will order 1 PRBC transfusion as patient is worried of getting discharged and dropping his blood count much quickly.  He would like to avoid  readmission   Hypertension: Continue norvasc , coreg , imdur  Hyperlipidemia: Continue lipitor  Type 2 diabetes: Monitor CBGs.  Continue home SGLT2 inhibitor.  GOC - Palliative care c/s   DVT prophylaxis: Heparin  heparin  injection 5,000 Units Start: 07/02/24 0600     Code Status: Full code Family Communication: none at bedside Disposition Plan: possible DC tomorrow.  Need to get 1 PRBC transfusion and recheck hemoglobin in the morning   Consultants:  Cardio  Procedures:  US  guided thora     Subjective:  Feeling much better would like to get 1 more PRBC transfusion before going home.  He is worried not wanting to get readmitted just for blood transfusion  Objective: Vitals:   07/04/24 0517 07/04/24 0750 07/04/24 1210 07/04/24 1226  BP:  136/64 138/61 132/61  Pulse:  66 69 (!) 57  Resp:  18 20 18   Temp:  97.9 F (36.6 C) (!) 97.5 F (36.4 C) 97.8 F (36.6 C)  TempSrc:   Oral Oral  SpO2:  93% 94% 94%  Weight: 84.6 kg     Height:        Intake/Output Summary (Last 24 hours) at 07/04/2024 1243 Last data filed at 07/04/2024 0900 Gross per 24 hour  Intake 1800 ml  Output 2600 ml  Net -800 ml   Filed Weights   07/02/24 0500 07/03/24 0500 07/04/24 0517  Weight: 84.6 kg 83.1 kg 84.6 kg    Examination:  General exam: Appears calm and comfortable, chronically ill looking Respiratory system: decreased BS at  bases Cardiovascular system: S1 & S2 heard, RRR. No murmurs. No pedal edema. Gastrointestinal system: Abdomen is soft, benign Central nervous system: Alert and oriented. No focal neurological deficits. Extremities: Symmetric 5 x 5 power. Skin: No rashes, lesions or ulcers Psychiatry: Judgement and insight appear normal. Mood & affect appropriate.     Data Reviewed: I have personally reviewed following labs and imaging studies  CBC: Recent Labs  Lab 07/01/24 0711 07/01/24 0809 07/01/24 1220 07/02/24 0429 07/03/24 0545 07/04/24 0542  WBC 8.7  --   6.7 5.7 6.9 6.2  HGB 8.5* 8.2* 8.4* 7.2* 8.6* 8.0*  HCT 27.1* 24.0* 27.0* 22.5* 27.4* 25.3*  MCV 98.2  --  98.5 97.4 96.8 95.8  PLT 181  --  156 138* 151 141*   Basic Metabolic Panel: Recent Labs  Lab 07/01/24 0809 07/01/24 1220 07/01/24 1637 07/02/24 0429 07/03/24 0545 07/04/24 0542  NA 136 135  --  137 137 137  K 4.3 4.6  --  4.1 4.0 4.1  CL  --  98  --  100 98 97*  CO2  --  24  --  30 30 33*  GLUCOSE  --  158*  --  144* 149* 153*  BUN  --  30*  --  32* 34* 40*  CREATININE  --  1.98*  --  2.00* 1.95* 2.02*  CALCIUM   --  10.5*  --  9.9 10.0 10.0  MG  --   --  2.3  --   --   --    BNP (last 3 results) Recent Labs    07/01/24 1220  PROBNP 12,167.0*   HbA1C: No results for input(s): HGBA1C in the last 72 hours. CBG: Recent Labs  Lab 07/03/24 0851 07/03/24 1700 07/03/24 2140 07/04/24 0753 07/04/24 1214  GLUCAP 138* 183* 158* 158* 133*    Radiology Studies: US  Venous Img Lower Unilateral Left (DVT) Result Date: 07/02/2024 CLINICAL DATA:  Swelling. EXAM: LEFT LOWER EXTREMITY VENOUS DOPPLER ULTRASOUND TECHNIQUE: Gray-scale sonography with graded compression, as well as color Doppler and duplex ultrasound were performed to evaluate the lower extremity deep venous systems from the level of the common femoral vein and including the common femoral, femoral, profunda femoral, popliteal and calf veins including the posterior tibial, peroneal and gastrocnemius veins when visible. Spectral Doppler was utilized to evaluate flow at rest and with distal augmentation maneuvers in the common femoral, femoral and popliteal veins. COMPARISON:  12/20/2022 FINDINGS: Contralateral Common Femoral Vein: Respiratory phasicity is normal and symmetric with the symptomatic side. No evidence of thrombus. Normal compressibility. Common Femoral Vein: No evidence of thrombus. Normal compressibility, respiratory phasicity and response to augmentation. Saphenofemoral Junction: No evidence of thrombus.  Normal compressibility and flow on color Doppler imaging. Profunda Femoral Vein: No evidence of thrombus. Normal compressibility and flow on color Doppler imaging. Femoral Vein: No evidence of thrombus. Normal compressibility, respiratory phasicity and response to augmentation. Popliteal Vein: No evidence of thrombus. Normal compressibility, respiratory phasicity and response to augmentation. Calf Veins: No evidence of thrombus. Normal compressibility and flow on color Doppler imaging. Other Findings:  None. IMPRESSION: Negative for deep venous thrombosis in left lower extremity. Electronically Signed   By: Juliene Balder M.D.   On: 07/02/2024 17:01   US  THORACENTESIS ASP PLEURAL SPACE W/IMG GUIDE Result Date: 07/02/2024 INDICATION: 78 year old male presents with dyspnea and right-sided pleural effusion. Received request for diagnostic and therapeutic thoracentesis. EXAM: ULTRASOUND GUIDED DIAGNOSTIC AND THERAPEUTIC, RIGHT-SIDED THORACENTESIS MEDICATIONS: 10 mL 1% lidocaine  COMPLICATIONS: None  immediate. PROCEDURE: An ultrasound guided thoracentesis was thoroughly discussed with the patient and questions answered. The benefits, risks, alternatives and complications were also discussed. The patient understands and wishes to proceed with the procedure. Written consent was obtained. Ultrasound was performed to localize and mark an adequate pocket of fluid in the right chest. The area was then prepped and draped in the normal sterile fashion. 1% lidocaine  was used for local anesthesia. Under ultrasound guidance a 6 Fr Safe-T-Centesis catheter was introduced. Thoracentesis was performed. The catheter was removed and a dressing applied. FINDINGS: A total of approximately 700 mL of amber fluid was removed. Samples were sent to the laboratory as requested by the clinical team. IMPRESSION: Successful ultrasound guided right thoracentesis yielding 700 mL of pleural fluid. Performed by: Rayfield Buff, NP under the direct  supervision of Dr Cordella Banner Electronically Signed   By: Cordella Banner   On: 07/02/2024 16:09   DG Chest Port 1 View Result Date: 07/02/2024 CLINICAL DATA:  Status post thoracentesis EXAM: PORTABLE CHEST 1 VIEW COMPARISON:  Chest x-ray 07/02/2024 FINDINGS: There are small bilateral pleural effusions, decreased on the right. There is no pneumothorax. Cardiomediastinal silhouette is enlarged unchanged. Sternotomy wires and fasteners are again seen. IMPRESSION: Small bilateral pleural effusions, decreased on the right. No pneumothorax. Electronically Signed   By: Greig Pique M.D.   On: 07/02/2024 15:53        Scheduled Meds:  sodium chloride    Intravenous Once   amLODipine   10 mg Oral Daily   aspirin  EC  81 mg Oral Daily   atorvastatin   80 mg Oral Daily   carvedilol   3.125 mg Oral BID WC   clopidogrel   75 mg Oral Daily   cyanocobalamin   1,000 mcg Oral Daily   empagliflozin   10 mg Oral QAC breakfast   feeding supplement  237 mL Oral BID BM   ferrous sulfate   325 mg Oral Daily   furosemide   60 mg Intravenous BID   heparin   5,000 Units Subcutaneous Q8H   isosorbide  mononitrate  120 mg Oral Daily   pantoprazole   40 mg Oral Daily   sodium chloride  flush  3 mL Intravenous Q12H   Continuous Infusions:   LOS: 3 days    Time spent: 35 mins    Rhiannan Kievit Maree, MD [facility] Pager [phone]  If 7PM-7AM, please contact night-coverage [email or web address]  07/04/2024, 12:43 PM

## 2024-07-04 NOTE — Progress Notes (Signed)
 Report called to [facility] on 1C. Patient prepared for transfer to med-surg.

## 2024-07-04 NOTE — Plan of Care (Signed)
   Problem: Education: Goal: Knowledge of General Education information will improve Description Including pain rating scale, medication(s)/side effects and non-pharmacologic comfort measures Outcome: Progressing

## 2024-07-04 NOTE — Progress Notes (Signed)
 Patient transferred off unit. No distress noted.

## 2024-07-04 NOTE — Progress Notes (Signed)
 Mobility Specialist Progress Note:    07/04/24 1015  Mobility  Activity Ambulated with assistance  Level of Assistance Standby assist, set-up cues, supervision of patient - no hands on  Assistive Device None  Distance Ambulated (ft) 35 ft  Range of Motion/Exercises Active;All extremities  Activity Response Tolerated well  Mobility visit 1 Mobility  Mobility Specialist Start Time (ACUTE ONLY) 1000  Mobility Specialist Stop Time (ACUTE ONLY) 1015  Mobility Specialist Time Calculation (min) (ACUTE ONLY) 15 min   Pt received in chair utilizing RA, agreeable to mobility. Required supervision to stand and ambulate with no AD. Tolerated well, SpO2 91% on RA and HR 81 bpm after ambulation; pt denies SOB. Returned to chair, belongings in reach. RN notified, all needs met.  Sherrilee Ditty Mobility Specialist Please contact via Special Educational Needs Teacher or  Rehab office at [phone]

## 2024-07-05 DIAGNOSIS — I10 Essential (primary) hypertension: Secondary | ICD-10-CM | POA: Diagnosis not present

## 2024-07-05 DIAGNOSIS — I509 Heart failure, unspecified: Secondary | ICD-10-CM | POA: Diagnosis not present

## 2024-07-05 DIAGNOSIS — E1169 Type 2 diabetes mellitus with other specified complication: Secondary | ICD-10-CM | POA: Diagnosis not present

## 2024-07-05 DIAGNOSIS — I480 Paroxysmal atrial fibrillation: Secondary | ICD-10-CM | POA: Diagnosis not present

## 2024-07-05 LAB — BPAM RBC
Blood Product Expiration Date: 202512152359
Blood Product Expiration Date: 202512222359
ISSUE DATE / TIME: 202511201609
ISSUE DATE / TIME: 202511221205
Unit Type and Rh: 6200
Unit Type and Rh: 6200

## 2024-07-05 LAB — TYPE AND SCREEN
ABO/RH(D): A POS
Antibody Screen: NEGATIVE
Unit division: 0
Unit division: 0

## 2024-07-05 LAB — GLUCOSE, CAPILLARY: Glucose-Capillary: 136 mg/dL — ABNORMAL HIGH (ref 70–99)

## 2024-07-05 LAB — CBC
HCT: 27.8 % — ABNORMAL LOW (ref 39.0–52.0)
Hemoglobin: 9.1 g/dL — ABNORMAL LOW (ref 13.0–17.0)
MCH: 30.3 pg (ref 26.0–34.0)
MCHC: 32.7 g/dL (ref 30.0–36.0)
MCV: 92.7 fL (ref 80.0–100.0)
Platelets: 143 K/uL — ABNORMAL LOW (ref 150–400)
RBC: 3 MIL/uL — ABNORMAL LOW (ref 4.22–5.81)
RDW: 21.1 % — ABNORMAL HIGH (ref 11.5–15.5)
WBC: 6.8 K/uL (ref 4.0–10.5)
nRBC: 0 % (ref 0.0–0.2)

## 2024-07-05 LAB — BASIC METABOLIC PANEL WITH GFR
Anion gap: 10 (ref 5–15)
BUN: 40 mg/dL — ABNORMAL HIGH (ref 8–23)
CO2: 31 mmol/L (ref 22–32)
Calcium: 9.9 mg/dL (ref 8.9–10.3)
Chloride: 94 mmol/L — ABNORMAL LOW (ref 98–111)
Creatinine, Ser: 1.88 mg/dL — ABNORMAL HIGH (ref 0.61–1.24)
GFR, Estimated: 37 mL/min — ABNORMAL LOW (ref >60)
Glucose, Bld: 187 mg/dL — ABNORMAL HIGH (ref 70–99)
Potassium: 4 mmol/L (ref 3.5–5.1)
Sodium: 135 mmol/L (ref 135–145)

## 2024-07-05 NOTE — Discharge Summary (Signed)
 Physician Discharge Summary   Patient: [name] MRN: [id number] DOB: 04-Apr-1946  Admit date:     07/01/2024  Discharge date: 07/05/2024  Discharge Physician: Cresencio Fairly   PCP: Pcp, No   Recommendations at discharge:    F/up with outpt providers as requested  Discharge Diagnoses: Principal Problem:   Acute exacerbation of CHF (congestive heart failure) (HCC) Active Problems:   Paroxysmal atrial fibrillation (HCC)   T2DM (type 2 diabetes mellitus) (HCC)   Essential hypertension   OSA (obstructive sleep apnea)   S/P CABG x 3   Hyperlipidemia associated with type 2 diabetes mellitus [facility])  Hospital Course: Assessment and Plan:  78 y.o. year old male with medical history of hypertension, hyperlipidemia, type 2 diabetes, CAD status post CABG, CHF, paroxysmal A-fib, OSA presenting from Cath Lab given he was hypoxic post cath    11/20: cardio c/s, US  guided Thora, 1 PRBC transfusion 11/21: Palliative care c/s, DC tele, transfer to med-surg 11/22: 1 PRBC transfusion for hemoglobin of 8.0   Acute hypoxic respi failure Initially required 3 L oxygen via nasal cannula.  Now weaned off to room air   Acute on chronic diastolic CHF exacerbation Coronary artery disease s/p CABG Demand ischemia 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  Chest x-ray with signs of volume overload.   - Cardiology seen  - Diuresed with lasix .   - Cath also showed pulmonary hypertension and patient has OSA and states he is not adherent to his CPAP.  States his sleep quality is poor given he has to wake up multiple times to urinate given his diuretic. Left lower extremity swelling: - US /Doppler neg for DVT - continue asa, plavix    Rt pleural effusion US  guided thora with removal of 700 cc of fluid   Anemia of CKD Required 2 PRBC transfusion    Hypertension: Continue norvasc , coreg , imdur  Hyperlipidemia:  Continue lipitor  Type 2 diabetes: Monitor CBGs.  Continue home SGLT2 inhibitor.   GOC - Palliative care seen       Consultants: Cardio, Palliative care  Disposition: Home Diet recommendation:  Discharge Diet Orders (From admission, onward)     Start     Ordered   07/05/24 0000  Diet - low sodium heart healthy        07/05/24 0800           Carb modified diet DISCHARGE MEDICATION: Allergies as of 07/05/2024   No Known Allergies      Medication List     STOP taking these medications    acetaminophen  325 MG tablet Commonly known as: TYLENOL        TAKE these medications    amLODipine  10 MG tablet Commonly known as: NORVASC  Take 10 mg by mouth daily.   aspirin  EC 81 MG tablet Take 81 mg by mouth daily.   atorvastatin  80 MG tablet Commonly known as: LIPITOR  Take 80 mg by mouth.  Take 80 mg by mouth in the morning.   bisacodyl  5 MG EC tablet Commonly known as: DULCOLAX Take 5 mg by mouth as needed for moderate constipation.   carvedilol  3.125 MG tablet Commonly known as: COREG  Take 1 tablet (3.125 mg total) by mouth 2 (two) times daily with a meal.   clopidogrel  75 MG tablet Commonly known as: PLAVIX  Take 75 mg by mouth daily.   cyanocobalamin  1000 MCG tablet Commonly known as: VITAMIN B12 Take 1,000 mcg by mouth  daily.   dexlansoprazole 60 MG capsule Commonly known as: DEXILANT Take 1 capsule by mouth daily.   Iron  325 (65 Fe) MG Tabs Take 1 tablet by mouth daily.   isosorbide  mononitrate 120 MG 24 hr tablet Commonly known as: IMDUR  Take 120 mg by mouth daily.   Jardiance  10 MG Tabs tablet Generic drug: empagliflozin  Take 1 tablet (10 mg total) by mouth daily before breakfast.   lactulose  10 GM/15ML solution Commonly known as: CHRONULAC  Taking 15-20 cc by mouth 3 to 4 times a day for constiption What changed: additional instructions   nitroGLYCERIN  0.4 MG SL tablet Commonly known as: NITROSTAT  Place 0.4 mg under the tongue every 5  (five) minutes as needed.   torsemide  20 MG tablet Commonly known as: DEMADEX  Take 20 mg by mouth daily.        Follow-up Information     Paraschos, Alexander, MD. Go in 1 week(s).   Specialty: Cardiology Why: 07/08/2024  3:45 PM Contact information: [address] [facility] [place] [address] [phone]         Melanee Annah BROCKS, MD. Schedule an appointment as soon as possible for a visit in 1 week(s).   Specialty: Oncology Why: Office closed patient to make own follow up appt  [facility] Discharge F/UP Contact information: [address] [place] [address] [phone]                Discharge Exam: Fredricka Weights   07/03/24 0500 07/04/24 0517 07/05/24 0351  Weight: 83.1 kg 84.6 kg 87.5 kg   General exam: Appears calm and comfortable Respiratory system: decreased BS at bases Cardiovascular system: S1 & S2 heard, RRR. No murmurs. No pedal edema. Gastrointestinal system: Abdomen is soft, benign Central nervous system: Alert and oriented. No focal neurological deficits. Extremities: Symmetric 5 x 5 power. Skin: No rashes, lesions or ulcers Psychiatry: Judgement and insight appear normal. Mood & affect appropriate.   Condition at discharge: fair  The results of significant diagnostics from this hospitalization (including imaging, microbiology, ancillary and laboratory) are listed below for reference.   Imaging Studies: US  Venous Img Lower Unilateral Left (DVT) Result Date: 07/02/2024 CLINICAL DATA:  Swelling. EXAM: LEFT LOWER EXTREMITY VENOUS DOPPLER ULTRASOUND TECHNIQUE: Gray-scale sonography with graded compression, as well as color Doppler and duplex ultrasound were performed to evaluate the lower extremity deep venous systems from the level of the common femoral vein and including the common femoral, femoral, profunda femoral, popliteal and calf veins including the posterior tibial, peroneal and gastrocnemius veins  when visible. Spectral Doppler was utilized to evaluate flow at rest and with distal augmentation maneuvers in the common femoral, femoral and popliteal veins. COMPARISON:  12/20/2022 FINDINGS: Contralateral Common Femoral Vein: Respiratory phasicity is normal and symmetric with the symptomatic side. No evidence of thrombus. Normal compressibility. Common Femoral Vein: No evidence of thrombus. Normal compressibility, respiratory phasicity and response to augmentation. Saphenofemoral Junction: No evidence of thrombus. Normal compressibility and flow on color Doppler imaging. Profunda Femoral Vein: No evidence of thrombus. Normal compressibility and flow on color Doppler imaging. Femoral Vein: No evidence of thrombus. Normal compressibility, respiratory phasicity and response to augmentation. Popliteal Vein: No evidence of thrombus. Normal compressibility, respiratory phasicity and response to augmentation. Calf Veins: No evidence of thrombus. Normal compressibility and flow on color Doppler imaging. Other Findings:  None. IMPRESSION: Negative for deep venous thrombosis in left lower extremity. Electronically Signed   By: Juliene Balder M.D.   On: 07/02/2024 17:01  US  THORACENTESIS ASP PLEURAL SPACE W/IMG GUIDE Result Date: 07/02/2024 INDICATION: 78 year old male presents with dyspnea and right-sided pleural effusion. Received request for diagnostic and therapeutic thoracentesis. EXAM: ULTRASOUND GUIDED DIAGNOSTIC AND THERAPEUTIC, RIGHT-SIDED THORACENTESIS MEDICATIONS: 10 mL 1% lidocaine  COMPLICATIONS: None immediate. PROCEDURE: An ultrasound guided thoracentesis was thoroughly discussed with the patient and questions answered. The benefits, risks, alternatives and complications were also discussed. The patient understands and wishes to proceed with the procedure. Written consent was obtained. Ultrasound was performed to localize and mark an adequate pocket of fluid in the right chest. The area was then prepped and  draped in the normal sterile fashion. 1% lidocaine  was used for local anesthesia. Under ultrasound guidance a 6 Fr Safe-T-Centesis catheter was introduced. Thoracentesis was performed. The catheter was removed and a dressing applied. FINDINGS: A total of approximately 700 mL of amber fluid was removed. Samples were sent to the laboratory as requested by the clinical team. IMPRESSION: Successful ultrasound guided right thoracentesis yielding 700 mL of pleural fluid. Performed by: Rayfield Buff, NP under the direct supervision of Dr Cordella Banner Electronically Signed   By: Cordella Banner   On: 07/02/2024 16:09   DG Chest Port 1 View Result Date: 07/02/2024 CLINICAL DATA:  Status post thoracentesis EXAM: PORTABLE CHEST 1 VIEW COMPARISON:  Chest x-ray 07/02/2024 FINDINGS: There are small bilateral pleural effusions, decreased on the right. There is no pneumothorax. Cardiomediastinal silhouette is enlarged unchanged. Sternotomy wires and fasteners are again seen. IMPRESSION: Small bilateral pleural effusions, decreased on the right. No pneumothorax. Electronically Signed   By: Greig Pique M.D.   On: 07/02/2024 15:53   DG Chest Port 1 View Result Date: 07/02/2024 EXAM: 1 VIEW(S) XRAY OF THE CHEST 07/02/2024 07:30:00 AM COMPARISON: 07/01/2024 CLINICAL HISTORY: Shortness of breath FINDINGS: LUNGS AND PLEURA: Mild pulmonary edema. Moderate right and small left pleural effusions with associated bibasilar opacities. No pneumothorax. HEART AND MEDIASTINUM: Cardiomegaly, unchanged. Prior CABG and aortic valve replacement noted. BONES AND SOFT TISSUES: No acute osseous abnormality. IMPRESSION: 1. Mild pulmonary edema. 2. Moderate right and small left pleural effusions 3. No change in bilateral pulmonary opacities, which may reflect areas of atelectasis and/or consolidation. Electronically signed by: Waddell Calk MD 07/02/2024 11:40 AM EST RP Workstation: [id number]   DG Chest 2 View Result Date:  07/01/2024 CLINICAL DATA:  Shortness of breath. EXAM: CHEST - 2 VIEW COMPARISON:  03/03/2024. FINDINGS: Cardiomegaly, unchanged. Prior valve replacement and CABG. Median sternotomy. Aortic atherosclerosis. Moderate right and small left pleural effusions with bibasilar opacities, likely reflecting atelectasis. No pneumothorax. No acute osseous abnormality. IMPRESSION: 1. Moderate right and small left pleural effusions with bibasilar atelectasis. 2. Cardiomegaly. Electronically Signed   By: Harrietta Sherry M.D.   On: 07/01/2024 13:26   CARDIAC CATHETERIZATION Result Date: 07/01/2024   Hemodynamic findings consistent with severe pulmonary hypertension. 1.   RA 15/15 mmHg  RV 87/21  PA 86/42  (51)  PCWP 45/24  (32) 2.  Severe pulmonary hypertension    CT BONE MARROW BIOPSY & ASPIRATION Result Date: 06/15/2024 CLINICAL DATA:  Refractory edema related to chronic kidney disease and need for bone marrow biopsy. EXAM: CT GUIDED BONE MARROW ASPIRATION AND BIOPSY ANESTHESIA/SEDATION: Moderate (conscious) sedation was employed during this procedure. A total of Versed  2.0 mg and Fentanyl  100 mcg was administered intravenously. Moderate Sedation Time: 12 minutes. The patient's level of consciousness and vital signs were monitored continuously by radiology nursing throughout the procedure under my direct supervision. PROCEDURE: The procedure risks, benefits, and  alternatives were explained to the patient. Questions regarding the procedure were encouraged and answered. The patient understands and consents to the procedure. A time out was performed prior to initiating the procedure. The right gluteal region was prepped with chlorhexidine . Sterile gown and sterile gloves were used for the procedure. Local anesthesia was provided with 1% Lidocaine . Under CT guidance, an 11 gauge On Control bone cutting needle was advanced from a posterior approach into the right iliac bone. Needle positioning was confirmed with CT. Initial  non heparinized and heparinized aspirate samples were obtained of bone marrow. Core biopsy was performed via the On Control drill needle. COMPLICATIONS: None FINDINGS: Inspection of initial aspirate did reveal visible particles. Intact core biopsy sample was obtained. IMPRESSION: CT guided bone marrow biopsy of right posterior iliac bone with both aspirate and core samples obtained. Electronically Signed   By: Marcey Moan M.D.   On: 06/15/2024 11:03    Microbiology: Results for orders placed or performed during the hospital encounter of 07/01/24  Fungus Culture With Stain     Status: None (Preliminary result)   Collection Time: 07/02/24  2:40 PM   Specimen: PATH Cytology Pleural fluid  Result Value Ref Range Status   Fungus Stain Final report  Final    Comment: (NOTE) Performed At: [facility] [address] [place], [place] [id number] Jennette Shorter MD Ey:[id number]    Fungus (Mycology) Culture PENDING  Incomplete   Fungal Source PLEURAL  Final    Comment: Performed at [facility], [address] [place], [place] [address]  Culture, Fungus without Smear     Status: None (Preliminary result)   Collection Time: 07/02/24  2:40 PM   Specimen: PATH Cytology Pleural fluid  Result Value Ref Range Status   Specimen Description   Final    PLEURAL Performed at [facility], [address], [place], [place] [address]    Special Requests   Final    NONE Performed at [facility], [address], [place], [place] [address]    Culture   Final    NO FUNGUS ISOLATED AFTER 3 DAYS Performed at [facility], [address], [place], [place] [address]    Report Status PENDING  Incomplete  Acid Fast Smear (AFB)     Status: None   Collection Time: 07/02/24  2:40 PM   Specimen: PATH Cytology Pleural fluid  Result Value Ref Range Status   AFB Specimen Processing Concentration  Final   Acid Fast Smear Negative  Final    Comment: (NOTE) Performed  At: [facility] [address] [place], [place] [id number] Jennette Shorter MD Ey:[id number]    Source (AFB) PLEURAL  Final    Comment: Performed at [facility], [address] [place], [place] [address]  Body fluid culture w Gram Stain     Status: None (Preliminary result)   Collection Time: 07/02/24  2:40 PM   Specimen: PATH Cytology Pleural fluid  Result Value Ref Range Status   Specimen Description   Final    PLEURAL Performed at [facility], [address], [place], [place] [address]    Special Requests   Final    NONE Performed at [facility], [address] [place], [place] [address]    Gram Stain   Final    RARE WBC PRESENT,BOTH PMN AND MONONUCLEAR NO ORGANISMS SEEN    Culture   Final    NO GROWTH 2 DAYS Performed at [facility], [address] [place], [place] [address]    Report Status PENDING  Incomplete  Fungus Culture Result     Status: None   Collection Time: 07/02/24  2:40 PM  Result Value Ref Range Status   Result 1 Comment  Final    Comment: (NOTE) KOH/Calcofluor preparation:  no fungus observed. Performed At: [facility] [address] [place], [place] [id number] Jennette Shorter MD Ey:[id number]     Labs: CBC: Recent Labs  Lab 07/01/24 1220 07/02/24 0429 07/03/24 0545 07/04/24 0542 07/05/24 0347  WBC 6.7 5.7 6.9 6.2 6.8  HGB 8.4* 7.2* 8.6* 8.0* 9.1*  HCT 27.0* 22.5* 27.4* 25.3* 27.8*  MCV 98.5 97.4 96.8 95.8 92.7  PLT 156 138* 151 141* 143*   Basic Metabolic Panel: Recent Labs  Lab 07/01/24 1220 07/01/24 1637 07/02/24 0429 07/03/24 0545 07/04/24 0542 07/05/24 0347  NA 135  --  137 137 137 135  K 4.6  --  4.1 4.0 4.1 4.0  CL 98  --  100 98 97* 94*  CO2 24  --  30 30 33* 31  GLUCOSE 158*  --  144* 149* 153* 187*  BUN 30*  --  32* 34* 40* 40*  CREATININE 1.98*  --  2.00* 1.95* 2.02* 1.88*  CALCIUM  10.5*  --  9.9 10.0 10.0 9.9  MG  --  2.3  --   --   --   --    Liver  Function Tests: No results for input(s): AST, ALT, ALKPHOS, BILITOT, PROT, ALBUMIN in the last 168 hours. CBG: Recent Labs  Lab 07/04/24 0753 07/04/24 1214 07/04/24 1734 07/04/24 2014 07/05/24 0759  GLUCAP 158* 133* 193* 168* 136*    Discharge time spent: greater than 30 minutes.  Signed: Cresencio Fairly, MD [facility] 07/05/2024

## 2024-07-05 NOTE — TOC Transition Note (Signed)
 Transition of Care [facility]) - Discharge Note   Patient Details  Name: [name] MRN: [id number] Date of Birth: 12-Aug-1946  Transition of [facility]) CM/SW Contact:  Victory Jackquline RAMAN, RN Phone Number: 07/05/2024, 8:55 AM   Clinical Narrative:  Patient discharging home. Wife picking him up.  No further concerns. RNCM signing off.      Final next level of care: Home/Self Care Barriers to Discharge: Barriers Resolved   Patient Goals and CMS Choice            Discharge Placement                Patient to be transferred to facility by: Spouse Name of family member notified: Rock Patient and family notified of of transfer: 07/05/24  Discharge Plan and Services Additional resources added to the After Visit Summary for                                       Social Drivers of Health (SDOH) Interventions SDOH Screenings   Food Insecurity: No Food Insecurity (07/01/2024)  Housing: Low Risk  (07/01/2024)  Transportation Needs: No Transportation Needs (07/01/2024)  Utilities: Not At Risk (07/01/2024)  Depression (PHQ2-9): Low Risk  (06/26/2024)  Financial Resource Strain: Low Risk  (05/08/2024)   Received from [facility]  Social Connections: Moderately Isolated (07/01/2024)  Tobacco Use: Medium Risk (07/01/2024)     Readmission Risk Interventions     No data to display

## 2024-07-05 NOTE — Progress Notes (Signed)
 Mobility Specialist Progress Note:    07/05/24 0906  Mobility  Activity Refused and notified nurse if applicable   Pt kindly refused mobility, eager for discharge. All needs met.  Sherrilee Ditty Mobility Specialist Please contact via Special Educational Needs Teacher or  Rehab office at [phone]

## 2024-07-06 ENCOUNTER — Inpatient Hospital Stay: Admitting: Nurse Practitioner

## 2024-07-06 ENCOUNTER — Telehealth: Payer: Self-pay

## 2024-07-06 ENCOUNTER — Inpatient Hospital Stay

## 2024-07-06 DIAGNOSIS — D649 Anemia, unspecified: Secondary | ICD-10-CM

## 2024-07-06 DIAGNOSIS — I130 Hypertensive heart and chronic kidney disease with heart failure and stage 1 through stage 4 chronic kidney disease, or unspecified chronic kidney disease: Secondary | ICD-10-CM | POA: Diagnosis not present

## 2024-07-06 LAB — IRON AND TIBC
Iron: 59 ug/dL (ref 45–182)
Saturation Ratios: 24 % (ref 17.9–39.5)
TIBC: 245 ug/dL — ABNORMAL LOW (ref 250–450)
UIBC: 187 ug/dL

## 2024-07-06 LAB — BASIC METABOLIC PANEL - CANCER CENTER ONLY
Anion gap: 13 (ref 5–15)
BUN: 44 mg/dL — ABNORMAL HIGH (ref 8–23)
CO2: 27 mmol/L (ref 22–32)
Calcium: 10.2 mg/dL (ref 8.9–10.3)
Chloride: 96 mmol/L — ABNORMAL LOW (ref 98–111)
Creatinine: 2.07 mg/dL — ABNORMAL HIGH (ref 0.61–1.24)
GFR, Estimated: 33 mL/min — ABNORMAL LOW (ref >60)
Glucose, Bld: 164 mg/dL — ABNORMAL HIGH (ref 70–99)
Potassium: 3.9 mmol/L (ref 3.5–5.1)
Sodium: 136 mmol/L (ref 135–145)

## 2024-07-06 LAB — CBC (CANCER CENTER ONLY)
HCT: 30.6 % — ABNORMAL LOW (ref 39.0–52.0)
Hemoglobin: 9.7 g/dL — ABNORMAL LOW (ref 13.0–17.0)
MCH: 29.8 pg (ref 26.0–34.0)
MCHC: 31.7 g/dL (ref 30.0–36.0)
MCV: 94.2 fL (ref 80.0–100.0)
Platelet Count: 133 K/uL — ABNORMAL LOW (ref 150–400)
RBC: 3.25 MIL/uL — ABNORMAL LOW (ref 4.22–5.81)
RDW: 20.7 % — ABNORMAL HIGH (ref 11.5–15.5)
WBC Count: 7.1 K/uL (ref 4.0–10.5)
nRBC: 0 % (ref 0.0–0.2)

## 2024-07-06 LAB — SAMPLE TO BLOOD BANK

## 2024-07-06 LAB — FERRITIN: Ferritin: 1362 ng/mL — ABNORMAL HIGH (ref 24–336)

## 2024-07-06 LAB — CYTOLOGY - NON PAP

## 2024-07-06 NOTE — Telephone Encounter (Signed)
 hgb 9.7, platelets 133, please advise regarding possible blood transfusion tomorrow.  Per Dr. Babara no blood.  Outbound call; informed of above. Patient recently released from hospital advised to follow up with Dr. Melanee in one week; patient already scheduled to see Dr. Melanee on 07/13/24.  Will further discuss bone marrow biopsy results then.  NGS myeloid already scanned into chart.

## 2024-07-07 ENCOUNTER — Inpatient Hospital Stay

## 2024-07-07 ENCOUNTER — Telehealth: Payer: Self-pay | Admitting: Oncology

## 2024-07-07 LAB — BODY FLUID CULTURE W GRAM STAIN: Culture: NO GROWTH

## 2024-07-07 NOTE — Telephone Encounter (Signed)
 Pt called and asked for his 12/1 appt to be moved to 12/2. Appt rescheduled to 12/2 at 2:15.

## 2024-07-13 ENCOUNTER — Inpatient Hospital Stay: Admitting: Oncology

## 2024-07-13 ENCOUNTER — Encounter: Payer: Self-pay | Admitting: Oncology

## 2024-07-14 ENCOUNTER — Inpatient Hospital Stay: Attending: Oncology | Admitting: Oncology

## 2024-07-14 ENCOUNTER — Inpatient Hospital Stay

## 2024-07-14 ENCOUNTER — Encounter: Payer: Self-pay | Admitting: Oncology

## 2024-07-14 VITALS — BP 139/60 | HR 65 | Temp 95.8°F | Resp 19 | Ht 73.0 in | Wt 191.2 lb

## 2024-07-14 DIAGNOSIS — D649 Anemia, unspecified: Secondary | ICD-10-CM

## 2024-07-14 DIAGNOSIS — I4891 Unspecified atrial fibrillation: Secondary | ICD-10-CM | POA: Diagnosis not present

## 2024-07-14 DIAGNOSIS — D631 Anemia in chronic kidney disease: Secondary | ICD-10-CM | POA: Diagnosis not present

## 2024-07-14 DIAGNOSIS — I130 Hypertensive heart and chronic kidney disease with heart failure and stage 1 through stage 4 chronic kidney disease, or unspecified chronic kidney disease: Secondary | ICD-10-CM | POA: Diagnosis present

## 2024-07-14 DIAGNOSIS — N184 Chronic kidney disease, stage 4 (severe): Secondary | ICD-10-CM | POA: Insufficient documentation

## 2024-07-14 DIAGNOSIS — Z7901 Long term (current) use of anticoagulants: Secondary | ICD-10-CM | POA: Diagnosis not present

## 2024-07-14 DIAGNOSIS — D508 Other iron deficiency anemias: Secondary | ICD-10-CM

## 2024-07-14 DIAGNOSIS — I5032 Chronic diastolic (congestive) heart failure: Secondary | ICD-10-CM | POA: Diagnosis not present

## 2024-07-14 LAB — HEMOGLOBIN AND HEMATOCRIT (CANCER CENTER ONLY)
HCT: 25.5 % — ABNORMAL LOW (ref 39.0–52.0)
Hemoglobin: 7.9 g/dL — ABNORMAL LOW (ref 13.0–17.0)

## 2024-07-14 LAB — SAMPLE TO BLOOD BANK

## 2024-07-14 MED ORDER — DARBEPOETIN ALFA 100 MCG/0.5ML IJ SOSY
60.0000 ug | PREFILLED_SYRINGE | INTRAMUSCULAR | Status: DC
  Administered 2024-07-14: 60 ug via SUBCUTANEOUS
  Filled 2024-07-14: qty 0.5

## 2024-07-14 NOTE — Progress Notes (Signed)
 Per Dr. Melanee Hemoglobin is 7.9. He is feeling symptomatic and we can give him 1 unit of PRBC transfusion; patient is already scheduled for this Thurs 12/4 at 12:15pm.  Outbound call to patient; informed of above.  Patient mentioned that he has an appointment with Dr. 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

## 2024-07-14 NOTE — Progress Notes (Unsigned)
 Patient would like results from scan & wants to know how he will proceed with a resolution.

## 2024-07-15 ENCOUNTER — Other Ambulatory Visit: Payer: Self-pay

## 2024-07-15 DIAGNOSIS — D649 Anemia, unspecified: Secondary | ICD-10-CM

## 2024-07-15 LAB — PREPARE RBC (CROSSMATCH)

## 2024-07-16 ENCOUNTER — Inpatient Hospital Stay

## 2024-07-16 DIAGNOSIS — D649 Anemia, unspecified: Secondary | ICD-10-CM

## 2024-07-16 DIAGNOSIS — I130 Hypertensive heart and chronic kidney disease with heart failure and stage 1 through stage 4 chronic kidney disease, or unspecified chronic kidney disease: Secondary | ICD-10-CM | POA: Diagnosis not present

## 2024-07-16 MED ORDER — ACETAMINOPHEN 325 MG PO TABS
650.0000 mg | ORAL_TABLET | Freq: Once | ORAL | Status: AC
  Administered 2024-07-16: 650 mg via ORAL
  Filled 2024-07-16: qty 2

## 2024-07-16 MED ORDER — SODIUM CHLORIDE 0.9% IV SOLUTION
250.0000 mL | INTRAVENOUS | Status: DC
  Filled 2024-07-16: qty 250

## 2024-07-16 NOTE — Patient Instructions (Signed)

## 2024-07-17 LAB — BPAM RBC
Blood Product Expiration Date: 202512282359
ISSUE DATE / TIME: 202512041321
Unit Type and Rh: 202512282359
Unit Type and Rh: 6200

## 2024-07-17 LAB — TYPE AND SCREEN
ABO/RH(D): A POS
Antibody Screen: NEGATIVE
Unit division: 0

## 2024-07-18 ENCOUNTER — Observation Stay
Admission: EM | Admit: 2024-07-18 | Discharge: 2024-07-23 | DRG: 291 | Disposition: A | Attending: Internal Medicine | Admitting: Internal Medicine

## 2024-07-18 ENCOUNTER — Other Ambulatory Visit: Payer: Self-pay

## 2024-07-18 ENCOUNTER — Encounter: Payer: Self-pay | Admitting: Intensive Care

## 2024-07-18 ENCOUNTER — Emergency Department

## 2024-07-18 DIAGNOSIS — I480 Paroxysmal atrial fibrillation: Secondary | ICD-10-CM | POA: Diagnosis present

## 2024-07-18 DIAGNOSIS — N189 Chronic kidney disease, unspecified: Secondary | ICD-10-CM | POA: Diagnosis present

## 2024-07-18 DIAGNOSIS — Z952 Presence of prosthetic heart valve: Secondary | ICD-10-CM

## 2024-07-18 DIAGNOSIS — G4733 Obstructive sleep apnea (adult) (pediatric): Secondary | ICD-10-CM | POA: Diagnosis present

## 2024-07-18 DIAGNOSIS — E8771 Transfusion associated circulatory overload: Secondary | ICD-10-CM

## 2024-07-18 DIAGNOSIS — I10 Essential (primary) hypertension: Secondary | ICD-10-CM | POA: Diagnosis present

## 2024-07-18 DIAGNOSIS — I2510 Atherosclerotic heart disease of native coronary artery without angina pectoris: Secondary | ICD-10-CM | POA: Diagnosis present

## 2024-07-18 DIAGNOSIS — N1832 Chronic kidney disease, stage 3b: Secondary | ICD-10-CM | POA: Diagnosis present

## 2024-07-18 DIAGNOSIS — R591 Generalized enlarged lymph nodes: Secondary | ICD-10-CM | POA: Insufficient documentation

## 2024-07-18 DIAGNOSIS — I340 Nonrheumatic mitral (valve) insufficiency: Secondary | ICD-10-CM | POA: Diagnosis present

## 2024-07-18 DIAGNOSIS — D631 Anemia in chronic kidney disease: Secondary | ICD-10-CM | POA: Diagnosis present

## 2024-07-18 DIAGNOSIS — J9611 Chronic respiratory failure with hypoxia: Principal | ICD-10-CM

## 2024-07-18 DIAGNOSIS — J9601 Acute respiratory failure with hypoxia: Secondary | ICD-10-CM | POA: Diagnosis present

## 2024-07-18 DIAGNOSIS — J90 Pleural effusion, not elsewhere classified: Secondary | ICD-10-CM | POA: Diagnosis present

## 2024-07-18 DIAGNOSIS — I5033 Acute on chronic diastolic (congestive) heart failure: Secondary | ICD-10-CM | POA: Diagnosis present

## 2024-07-18 LAB — CBC
HCT: 28.2 % — ABNORMAL LOW (ref 39.0–52.0)
Hemoglobin: 8.8 g/dL — ABNORMAL LOW (ref 13.0–17.0)
MCH: 30.9 pg (ref 26.0–34.0)
MCHC: 31.2 g/dL (ref 30.0–36.0)
MCV: 98.9 fL (ref 80.0–100.0)
Platelets: 155 K/uL (ref 150–400)
RBC: 2.85 MIL/uL — ABNORMAL LOW (ref 4.22–5.81)
RDW: 18.9 % — ABNORMAL HIGH (ref 11.5–15.5)
WBC: 6.8 K/uL (ref 4.0–10.5)
nRBC: 0 % (ref 0.0–0.2)

## 2024-07-18 LAB — BASIC METABOLIC PANEL WITH GFR
Anion gap: 12 (ref 5–15)
BUN: 40 mg/dL — ABNORMAL HIGH (ref 8–23)
CO2: 26 mmol/L (ref 22–32)
Calcium: 10.6 mg/dL — ABNORMAL HIGH (ref 8.9–10.3)
Chloride: 103 mmol/L (ref 98–111)
Creatinine, Ser: 1.9 mg/dL — ABNORMAL HIGH (ref 0.61–1.24)
GFR, Estimated: 36 mL/min — ABNORMAL LOW (ref >60)
Glucose, Bld: 162 mg/dL — ABNORMAL HIGH (ref 70–99)
Potassium: 4.6 mmol/L (ref 3.5–5.1)
Sodium: 140 mmol/L (ref 135–145)

## 2024-07-18 LAB — PRO BRAIN NATRIURETIC PEPTIDE: Pro Brain Natriuretic Peptide: 13853 pg/mL — ABNORMAL HIGH (ref <300.0)

## 2024-07-18 LAB — TROPONIN T, HIGH SENSITIVITY
Troponin T High Sensitivity: 75 ng/L — ABNORMAL HIGH (ref 0–19)
Troponin T High Sensitivity: 69 ng/L — ABNORMAL HIGH (ref 0–19)

## 2024-07-18 MED ORDER — NITROGLYCERIN 0.4 MG SL SUBL: 0.4000 mg | SUBLINGUAL_TABLET | SUBLINGUAL | Status: DC | PRN

## 2024-07-18 MED ORDER — ACETAMINOPHEN 325 MG PO TABS: 650.0000 mg | ORAL_TABLET | Freq: Four times a day (QID) | ORAL | Status: DC | PRN

## 2024-07-18 MED ORDER — EMPAGLIFLOZIN 10 MG PO TABS
10.0000 mg | ORAL_TABLET | Freq: Every day | ORAL | Status: DC
  Administered 2024-07-19 – 2024-07-23 (×5): 10 mg via ORAL
  Filled 2024-07-18 (×5): qty 1

## 2024-07-18 MED ORDER — ATORVASTATIN CALCIUM 80 MG PO TABS
80.0000 mg | ORAL_TABLET | Freq: Every day | ORAL | Status: DC
  Administered 2024-07-18 – 2024-07-22 (×5): 80 mg via ORAL
  Filled 2024-07-18 (×5): qty 1

## 2024-07-18 MED ORDER — HYDROCODONE-ACETAMINOPHEN 5-325 MG PO TABS: 1.0000 | ORAL_TABLET | ORAL | Status: DC | PRN

## 2024-07-18 MED ORDER — FUROSEMIDE 10 MG/ML IJ SOLN: 40.0000 mg | Freq: Two times a day (BID) | INTRAMUSCULAR | Status: DC

## 2024-07-18 MED ORDER — FUROSEMIDE 10 MG/ML IJ SOLN
40.0000 mg | Freq: Once | INTRAMUSCULAR | Status: AC
  Administered 2024-07-18: 40 mg via INTRAVENOUS
  Filled 2024-07-18: qty 4

## 2024-07-18 MED ORDER — LACTULOSE 10 GM/15ML PO SOLN: 20.0000 g | Freq: Two times a day (BID) | ORAL | Status: DC | PRN

## 2024-07-18 MED ORDER — ONDANSETRON HCL 4 MG/2ML IJ SOLN: 4.0000 mg | Freq: Four times a day (QID) | INTRAMUSCULAR | Status: DC | PRN

## 2024-07-18 MED ORDER — ISOSORBIDE MONONITRATE ER 60 MG PO TB24
120.0000 mg | ORAL_TABLET | Freq: Every day | ORAL | Status: DC
  Administered 2024-07-18 – 2024-07-23 (×6): 120 mg via ORAL
  Filled 2024-07-18 (×6): qty 2

## 2024-07-18 MED ORDER — ACETAMINOPHEN 650 MG RE SUPP: 650.0000 mg | Freq: Four times a day (QID) | RECTAL | Status: DC | PRN

## 2024-07-18 MED ORDER — CARVEDILOL 3.125 MG PO TABS
3.1250 mg | ORAL_TABLET | Freq: Two times a day (BID) | ORAL | Status: DC
  Administered 2024-07-19 – 2024-07-23 (×10): 3.125 mg via ORAL
  Filled 2024-07-18 (×11): qty 1

## 2024-07-18 MED ORDER — ONDANSETRON HCL 4 MG PO TABS: 4.0000 mg | ORAL_TABLET | Freq: Four times a day (QID) | ORAL | Status: DC | PRN

## 2024-07-18 NOTE — Assessment & Plan Note (Signed)
 -  Renal function  at baseline

## 2024-07-18 NOTE — Plan of Care (Signed)

## 2024-07-18 NOTE — Assessment & Plan Note (Signed)
 Had chest pain that was resolved with nitroglycerin  Troponin slightly elevated but downtrending at 75-> 69 Continue aspirin , carvedilol , atorvastatin , Imdur , nitroglycerin  sublingual as needed chest pain

## 2024-07-18 NOTE — ED Notes (Signed)
 Patient ambulated with this RN to restroom while monitoring O2 sats. Patient's O2 sats dropped to 82% on room air. Patient was also SOB during ambulation. Patient walked back to bed and O2 sats came up to 96%. Patient remained labored for several minutes. Patient was placed on 2L [place]. MD Jossie was notified of findings.

## 2024-07-18 NOTE — Assessment & Plan Note (Signed)
Patient is CPAP intolerant.

## 2024-07-18 NOTE — Assessment & Plan Note (Signed)
-   Continue home meds

## 2024-07-18 NOTE — Assessment & Plan Note (Addendum)
 [name]

## 2024-07-18 NOTE — ED Triage Notes (Signed)
 Patient c/o chest pain, sob and lack of energy that started yesterday. Took nitroglycerin  sublingual at home with some relief   Reports he received blood transfusion on Thursday.

## 2024-07-18 NOTE — Assessment & Plan Note (Deleted)
 Aortic stenosis status post TAVR Severe pulmonary hypertension on RHC 07/01/2024

## 2024-07-18 NOTE — H&P (Signed)
 History and Physical    Patient: [name] [name] DOB: [birth year] DOA: 07/18/2024 DOS: the patient was seen and examined on 07/18/2024 PCP: [name], MD  Patient coming from: Home  Chief Complaint:  Chief Complaint  Patient presents with   Chest Pain    HPI: [name] is a 78 y.o. 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 He presented with 1 day of intermittent chest pain, shortness of breath and fatigue.  He denied cough, fever or chills or lower extremity pain.  He took a nitroglycerin  at home with some relief in chest pain. In the ED mild tachypnea to 22, O2 sat initially 91% on room air.  He desatted to 82 while in the ED requiring 2 L to maintain sats in the high 90s.   Labs notable for troponin 75--69 and proBNP 13,000.  Hemoglobin 8.8, up from 7.9 about 4 days ago.  Creatinine 1.9 which is about his baseline for CKD 3B.EKG showed A-fib at 68 with RBBB Chest x-ray showed stable bibasilar consolidation and effusions right greater than left and a stable cardiac silhouette. While in the ED he desaturated to 82% and was placed on O2.  Treated with IV Lasix . Admission requested.     Past Medical History:  Diagnosis Date   (HFpEF) heart failure with preserved ejection fraction (HCC) 07/25/2016   a.)TTE 07/25/16: EF >55, triv-mild pan regur, mild AS, G2DD; b.)TTE 09/02/16: EF >55, triv TR, mild AS; c.)TTE 11/05/16: EF >55, LAE, triv-mild pan regur, mild AS,  G2DD; d.)TTE 01/21/20: EF >55, mod LVH, LAE/RVE, triv-mild AR/PR/TR, mod MR, mild AS, G2DD; e.)TTE 09/24/22: EF>55, sev LVH, sev LAE, RAE, mild PR, mod AR/MR/TR, mild-mod AS, RVSP 74.7; f.)TTE 12/23/22: EF 60-65, LAE, mild-mod MR/TR, mild AS   Abnormal urine 05/01/2023   Anemia    Aortic atherosclerosis    Aortic stenosis 07/25/2016   a.) TTE 07/25/2016: mild (MPG 13.5); b.) TTE 09/02/2016: mild (MPG 16); c.) TTE 11/05/2016: mild (MPG 9); d.) TTE 01/21/2020: mild (MPG 11.7); e.) TTE 09/24/2022: mild-mod (MPG 20.3); f.) TTE 12/23/2022: mild (MPG 15)   Atrial fibrillation (HCC)    a.) CHA2DS2VASc = 6 (age x2, CHF, HTN, vascular disease history, T2DM);  b.) rate/rhythm maintained on oral labetalol ; chronically anticoagulated with apixaban  + clopidogrel    AV block, 1st degree    B12 deficiency    CKD (chronic kidney disease), stage III (HCC)    Colon polyps    Congestive heart failure (HCC) 05/01/2023   Coronary artery disease 08/16/2016   a.) s/p 3v CABG 08/30/2016; b.) s/p PCI 11/26/2016 (DES x 1 oSVG-RCA); c.) s/p PCI 12/17/2016 (DES x 5 --> mLCx x2, dLCx, pLAD, dLAD); c.) s/p PCI 06/09/2020 (DES x1 --> ISR oSVG-PDA)   Cyst of kidney, acquired 07/03/2023   Dyspnea    Gastroesophageal reflux disease 05/01/2023   GERD (gastroesophageal reflux disease)    Heart murmur    Hepatic steatosis    History of 2019 novel coronavirus disease (COVID-19) 09/02/2020   a.) Tx'd with remdesivir    History of blood transfusion  History of GI bleed 09/02/2020   a.) admitted to [facility] 09/02/2020 - 09/08/2020   History of heart artery stent 11/26/2016   TOTAL of 7 stents: a.) 11/26/2016 --> 3.5 x 22 mm Resolute Integrity oSVG-RCA; b.) 12/17/2016: 3.0 x 12 mm Xience Alpine dLCx, 3.5 x 22 mm Resolute Onyx mLCx, 3.5 x 18 mm Resolute Onyx mLCx, 3.0 x 26 mm Resolute Onyx pLAD, 2.0 x 26 mm Resolute Onyx dLAD; c.) 06/09/2020 --> 2.5 x 18 mm Resolute Onyx ISR oSVG-PDA   History of partial colectomy 08/12/2012   a.) large  gastric hyperplastic polyp removal   HLD (hyperlipidemia)    Hypertension    LBBB (left bundle branch block)    Long term current use of anticoagulant    a.) apixaban    Long term current use of antithrombotics/antiplatelets    a.) clopidogrel    Male hypogonadism    a.) on exogenous TRT (1.62% gel)   Mild cardiomegaly    Mild gynecomastia (bilateral)    Nephrolithiasis    OSA (obstructive sleep apnea)    a.) does not utilize nocturnal PAP therapy   Personal history of surgery to heart and great vessels, presenting hazards to health 05/01/2023   Proteinuria, unspecified 08/01/2023   Pulmonary hypertension (HCC) 09/24/2022   a.) TTE 09/24/2022: RVSP 74.7; b.) TTE 12/23/2022: RVSP 31.3   RBBB (right bundle branch block with left anterior fascicular block)    Right inguinal hernia    Right thyroid  nodule    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   a.) LIMA-LAD, SVG-PDA, SVG-OM3   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/01/2023   Type 2 diabetes mellitus treated with insulin  (HCC)    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   Umbilical hernia    Unstable angina (HCC)    Vitamin D  deficiency    Past Surgical History:  Procedure Laterality Date   ANOMALOUS PULMONARY VENOUS RETURN REPAIR, TOTAL  2025   APPENDECTOMY     CARDIAC CATHETERIZATION Left 08/16/2016   Procedure: Left Heart Cath and Coronary Angiography;  Surgeon: Cara JONETTA Lovelace, MD;  Location: [facility];  Service: Cardiovascular;  Laterality: Left;   COLONOSCOPY N/A 09/07/2020   Procedure: COLONOSCOPY;  Surgeon: Janalyn Keene NOVAK, MD;  Location: [facility] ENDOSCOPY;  Service: Endoscopy;  Laterality: N/A;   COLONOSCOPY N/A 10/19/2023   Procedure: COLONOSCOPY;  Surgeon: Jinny Carmine, MD;  Location: [facility] ENDOSCOPY;  Service: Endoscopy;  Laterality: N/A;   COLONOSCOPY WITH PROPOFOL  N/A 05/17/2021   Procedure: COLONOSCOPY WITH PROPOFOL ;  Surgeon: Janalyn Keene NOVAK, MD;  Location: [facility]  ENDOSCOPY;  Service: Endoscopy;  Laterality: N/A;   CORONARY ANGIOPLASTY WITH STENT PLACEMENT Left 11/26/2016   Procedure: CORONARY ANGIOPLASTY WITH STENT PLACEMENT; Location: [facility]; Surgeon: Alm Dais, MD   CORONARY ARTERY BYPASS GRAFT N/A 08/30/2016   Procedure: CORONARY ARTERY BYPASS GRAFT; Location: [facility]; Surgeon: Juliene Pouch, MD   CORONARY STENT INTERVENTION N/A 06/09/2020   Procedure: CORONARY STENT INTERVENTION;  Surgeon: Lovelace Cara JONETTA, MD;  Location: [facility];  Service: Cardiovascular;  Laterality: N/A;   CORONARY STENT INTERVENTION Left 12/17/2016   Procedure: CORONARY STENT INTERVENTION; Location: [facility]; Surgeon: Alm Dais, MD   CORONARY STENT INTERVENTION  03/2024   ENDOSCOPIC MUCOSAL RESECTION N/A 09/22/2020   Procedure: ENDOSCOPIC MUCOSAL RESECTION;  Surgeon: [name] Aloha Raddle., MD;  Location: [facility] ENDOSCOPY;  Service: Gastroenterology;  Laterality: N/A;   ESOPHAGOGASTRODUODENOSCOPY N/A 09/07/2020   Procedure: ESOPHAGOGASTRODUODENOSCOPY (EGD);  Surgeon: Janalyn Keene NOVAK, MD;  Location:  [facility] ENDOSCOPY;  Service: Endoscopy;  Laterality: N/A;   ESOPHAGOGASTRODUODENOSCOPY N/A 10/19/2023   Procedure: EGD (ESOPHAGOGASTRODUODENOSCOPY);  Surgeon: Jinny Carmine, MD;  Location: [facility] ENDOSCOPY;  Service: Endoscopy;  Laterality: N/A;   ESOPHAGOGASTRODUODENOSCOPY (EGD) WITH PROPOFOL  N/A 09/22/2020   Procedure: ESOPHAGOGASTRODUODENOSCOPY (EGD) WITH PROPOFOL ;  Surgeon: [name] Aloha Raddle., MD;  Location: [facility] ENDOSCOPY;  Service: Gastroenterology;  Laterality: N/A;   ESOPHAGOGASTRODUODENOSCOPY (EGD) WITH PROPOFOL  N/A 08/26/2023   Procedure: ESOPHAGOGASTRODUODENOSCOPY (EGD) WITH PROPOFOL ;  Surgeon: Jinny Carmine, MD;  Location: [facility] ENDOSCOPY;  Service: Endoscopy;  Laterality: N/A;   FRACTURE SURGERY     HEMOSTASIS CLIP PLACEMENT  09/22/2020   Procedure: HEMOSTASIS CLIP PLACEMENT;  Surgeon: [name] Aloha Raddle., MD;  Location: [facility] ENDOSCOPY;  Service: Gastroenterology;;    HEMOSTASIS CONTROL  09/22/2020   Procedure: HEMOSTASIS CONTROL;  Surgeon: [name] Aloha Raddle., MD;  Location: [facility] ENDOSCOPY;  Service: Gastroenterology;;   INSERTION OF MESH  02/19/2023   Procedure: INSERTION OF MESH;  Surgeon: Desiderio Schanz, MD;  Location: [facility] ORS;  Service: General;;  umbilical   LAPAROSCOPIC PARTIAL RIGHT COLECTOMY Right 08/12/2012   Procedure: LAPAROSCOPIC PARTIAL RIGHT COLECTOMY; Location: [facility]; Surgeon: Unknown Sharps, MD   LEFT HEART CATH AND CORONARY ANGIOGRAPHY Left 06/09/2020   Procedure: LEFT HEART CATH AND CORONARY ANGIOGRAPHY;  Surgeon: Florencio Cara BIRCH, MD;  Location: [facility];  Service: Cardiovascular;  Laterality: Left;   LEFT HEART CATH AND CORS/GRAFTS ANGIOGRAPHY Left 11/20/2016   Procedure: Left Heart Cath and Cors/Grafts Angiography;  Surgeon: Cara BIRCH Florencio, MD;  Location: [facility];  Service: Cardiovascular;  Laterality: Left;   POLYPECTOMY  09/22/2020   Procedure: POLYPECTOMY;  Surgeon: [name] Aloha Raddle., MD;  Location: [facility] ENDOSCOPY;  Service: Gastroenterology;;   POLYPECTOMY  10/19/2023   Procedure: POLYPECTOMY;  Surgeon: Jinny Carmine, MD;  Location: [facility] ENDOSCOPY;  Service: Endoscopy;;   RIGHT HEART CATH Right 07/01/2024   Procedure: RIGHT HEART CATH;  Surgeon: Ammon Blunt, MD;  Location: [facility];  Service: Cardiovascular;  Laterality: Right;   SUBMUCOSAL LIFTING INJECTION  09/22/2020   Procedure: SUBMUCOSAL LIFTING INJECTION;  Surgeon: [name] Aloha Raddle., MD;  Location: [facility] ENDOSCOPY;  Service: Gastroenterology;;   UMBILICAL HERNIA REPAIR N/A 02/19/2023   Procedure: HERNIA REPAIR UMBILICAL ADULT, open;  Surgeon: Desiderio Schanz, MD;  Location: [facility] ORS;  Service: General;  Laterality: N/A;   XI ROBOTIC ASSISTED INGUINAL HERNIA REPAIR WITH MESH Right 02/19/2023   Procedure: XI ROBOTIC ASSISTED INGUINAL HERNIA REPAIR WITH MESH;  Surgeon: Desiderio Schanz, MD;  Location: [facility] OR[facility];  Service:  General;  Laterality: Right;   Social History:  reports that he quit smoking about 25 years ago. His smoking use included cigarettes. He has never been exposed to tobacco smoke. He has never used smokeless tobacco. He reports that he does not drink alcohol and does not use drugs.  No Known Allergies  Family History  Problem Relation Age of Onset   Hyperlipidemia Mother    Heart disease Mother    Hypertension Father     Prior to Admission medications   Medication Sig Start Date End Date Taking? Authorizing Provider  amLODipine  (NORVASC ) 10 MG tablet Take 10 mg by mouth daily. 03/23/24 03/23/25  [provider]  aspirin  EC 81 MG tablet Take 81 mg by mouth daily. 04/02/24 04/02/25  [provider]  atorvastatin  (LIPITOR ) 80 MG tablet Take 80 mg by mouth.  Take 80 mg by mouth in the morning. 12/21/23 12/20/24  [provider]  bisacodyl  (DULCOLAX) 5  MG EC tablet Take 5 mg by mouth as needed for moderate constipation.    [provider]  carvedilol  (COREG ) 3.125 MG tablet Take 1 tablet (3.125 mg total) by mouth 2 (two) times daily with a meal. 03/04/24   Caleen Qualia, MD  clopidogrel  (PLAVIX ) 75 MG tablet Take 75 mg by mouth daily. 03/23/24 03/23/25  [provider]  cyanocobalamin  (VITAMIN B12) 1000 MCG tablet Take 1,000 mcg by mouth daily.    [provider]  dexlansoprazole (DEXILANT) 60 MG capsule Take 1 capsule by mouth daily. 12/25/21   [provider]  empagliflozin  (JARDIANCE ) 10 MG TABS tablet Take 1 tablet (10 mg total) by mouth daily before breakfast. 08/29/23   Agbata, Tochukwu, MD  Ferrous Sulfate  (IRON ) 325 (65 Fe) MG TABS Take 1 tablet by mouth daily.    [provider]  isosorbide  mononitrate (IMDUR ) 120 MG 24 hr tablet Take 120 mg by mouth daily. 03/23/24 03/23/25  [provider]  lactulose  (CHRONULAC ) 10 GM/15ML solution Taking 15-20 cc by mouth 3 to 4 times a day for constiption Patient taking differently:  Taking 15-20 cc by mouth 3 to 4 times a day for constiption as needed 10/08/23   Therisa Bi, MD  nitroGLYCERIN  (NITROSTAT ) 0.4 MG SL tablet Place 0.4 mg under the tongue every 5 (five) minutes as needed. 07/30/23 07/29/24  [provider]  torsemide  (DEMADEX ) 20 MG tablet Take 20 mg by mouth daily. 03/22/24 04/02/25  [provider]    Physical Exam: Vitals:   07/18/24 1507 07/18/24 1555 07/18/24 1700 07/18/24 1914  BP:   (!) 168/69   Pulse:   62   Resp:   (!) 22   Temp:    (!) 97.4 F (36.3 C)  TempSrc:    Oral  SpO2: 92% 100% 98%   Weight:      Height:       Physical Exam  Labs on Admission: I have personally reviewed following labs and imaging studies  CBC: Recent Labs  Lab 07/14/24 1536 07/18/24 1422  WBC  --  6.8  HGB 7.9* 8.8*  HCT 25.5* 28.2*  MCV  --  98.9  PLT  --  155   Basic Metabolic Panel: Recent Labs  Lab 07/18/24 1422  NA 140  K 4.6  CL 103  CO2 26  GLUCOSE 162*  BUN 40*  CREATININE 1.90*  CALCIUM  10.6*   GFR: Estimated Creatinine Clearance: 36.9 mL/min (A) (by C-G formula based on SCr of 1.9 mg/dL (H)). Liver Function Tests: No results for input(s): AST, ALT, ALKPHOS, BILITOT, PROT, ALBUMIN in the last 168 hours. No results for input(s): LIPASE, AMYLASE in the last 168 hours. No results for input(s): AMMONIA in the last 168 hours. Coagulation Profile: No results for input(s): INR, PROTIME in the last 168 hours. Cardiac Enzymes: No results for input(s): CKTOTAL, CKMB, CKMBINDEX, TROPONINI in the last 168 hours. BNP (last 3 results) Recent Labs    07/01/24 1220 07/18/24 1707  PROBNP 12,167.0* 13,853.0*   HbA1C: No results for input(s): HGBA1C in the last 72 hours. CBG: No results for input(s): GLUCAP in the last 168 hours. Lipid Profile: No results for input(s): CHOL, HDL, LDLCALC, TRIG, CHOLHDL, LDLDIRECT in the last 72 hours. Thyroid  Function Tests: No results for  input(s): TSH, T4TOTAL, FREET4, T3FREE, THYROIDAB in the last 72 hours. Anemia Panel: No results for input(s): VITAMINB12, FOLATE, FERRITIN, TIBC, IRON , RETICCTPCT in the last 72 hours. Urine analysis:    Component Value Date/Time  COLORURINE YELLOW (A) 03/02/2024 1617   APPEARANCEUR CLEAR (A) 03/02/2024 1617   LABSPEC 1.014 03/02/2024 1617   PHURINE 5.0 03/02/2024 1617   GLUCOSEU >=500 (A) 03/02/2024 1617   HGBUR NEGATIVE 03/02/2024 1617   BILIRUBINUR NEGATIVE 03/02/2024 1617   KETONESUR NEGATIVE 03/02/2024 1617   PROTEINUR 100 (A) 03/02/2024 1617   NITRITE NEGATIVE 03/02/2024 1617   LEUKOCYTESUR NEGATIVE 03/02/2024 1617    Radiological Exams on Admission: DG Chest 2 View Result Date: 07/18/2024 CLINICAL DATA:  Chest pain and shortness of breath EXAM: CHEST - 2 VIEW COMPARISON:  07/02/2024 FINDINGS: Frontal and lateral views of the chest demonstrate stable postsurgical changes from median sternotomy and aortic valve replacement. Cardiac silhouette is enlarged but stable. Persistent bibasilar veiling opacities consistent with consolidation and effusions, right greater than left. No pneumothorax. No acute bony abnormalities. IMPRESSION: 1. Stable bibasilar consolidation and effusions, right greater than left. 2. Stable enlarged cardiac silhouette. Electronically Signed   By: Ozell Daring M.D.   On: 07/18/2024 14:53   Data Reviewed for HPI: Relevant notes from primary care and specialist visits, past discharge summaries as available in EHR, including Care Everywhere. Prior diagnostic testing as pertinent to current admission diagnoses Updated medications and problem lists for reconciliation ED course, including vitals, labs, imaging, treatment and response to treatment Triage notes, nursing and pharmacy notes and ED provider's notes Notable results as noted above in HPI      Assessment and Plan: * Acute on chronic heart failure with preserved ejection  fraction (HFpEF) (HCC) # Acute respiratory failure with hypoxia # Possible fluid overload secondary to blood transfusions (07/16/2024, 11/22, 11/20) # Recurrent right pleural effusion (s/p thoracentesis 07/02/2024, 700 mL) # Severe pulmonary hypertension on RHC 07/01/2024 # Moderate to severe MR/TR. Severe AS s/p TAVR -Present with shortness of breath, chest pain, proBNP 13,000, pleural effusions on chest x-ray -Desaturating to 82 with minimal exertion-has required O2 during hospitalizations and weaned off -IV Lasix  -Resume GDMT with carvedilol , Jardiance , Imdur .  Not on ACE/ARB likely because of kidney function -Can consider repeat thoracentesis on Monday -Daily weights with intake and output monitoring -Last EF 60 to 65%, 03/03/2024 -Supplemental oxygen to keep sats over 94, wean as tolerated but with consideration for home O2 evaluation - Patient has OSA, does not use CPAP-might benefit from getting refitted  Paroxysmal atrial fibrillation (HCC) Rate controlled Continue carvedilol  and aspirin --not on anticoagulation, likely due to recurrent anemia requiring transfusions  CAD (coronary artery disease), s/p CABG x 3 Had chest pain that was resolved with nitroglycerin  Troponin slightly elevated but downtrending at 75-> 69 Continue aspirin , carvedilol , atorvastatin , Imdur , nitroglycerin  sublingual as needed chest pain  Anemia in chronic kidney disease Frequent need for blood transfusions, most recently 07/16/2024 Hemoglobin 8.8 up from 7.9 prior to transfusion on 07/16/2024 Continue to monitor  Stage 3b chronic kidney disease (HCC) Renal function at baseline  OSA (obstructive sleep apnea) Patient is CPAP intolerant  Essential hypertension Continue home meds    DVT prophylaxis: SCD  Consults: none  Advance Care Planning:   Code Status: Prior   Family Communication: none  Disposition Plan: Back to previous home environment  Severity of Illness: The appropriate patient  status for this patient is OBSERVATION. Observation status is judged to be reasonable and necessary in order to provide the required intensity of service to ensure the patient's safety. The patient's presenting symptoms, physical exam findings, and initial radiographic and laboratory data in the context of their medical condition is felt to place them at  decreased risk for further clinical deterioration. Furthermore, it is anticipated that the patient will be medically stable for discharge from the hospital within 2 midnights of admission.   Author: Delayne LULLA Solian, MD 07/18/2024 8:07 PM  For on call review [email or web address].

## 2024-07-18 NOTE — Assessment & Plan Note (Signed)
 Hemoglobin up to 8.4 after 1 unit of packed red blood cells.  With LDH being high, CT scan of the chest, abdomen and pelvis showing progressive mediastinal adenopathy suggesting of chronic inflammatory or low-grade lymphoproliferative process.  Case discussed with Dr. Melanee oncology and she will set up for PET CT scan as outpatient.  Await cytology and flow cytometry from pleural fluid.  Total bilirubin normal range.  I added on a haptoglobin.  Other testing as per oncology

## 2024-07-18 NOTE — Hospital Course (Signed)
 [name]

## 2024-07-18 NOTE — Assessment & Plan Note (Addendum)
 Rate controlled Continue carvedilol  and aspirin --not on anticoagulation, likely due to recurrent anemia requiring transfusions

## 2024-07-18 NOTE — ED Provider Notes (Signed)
 [facility] Provider Note   Event Date/Time   First MD Initiated Contact with Patient 07/18/24 1457     (approximate) History  Chest Pain  HPI [name] is a 78 y.o. male with a past medical history of CKD, CAD status post stent placement and failed triple bypass, aortic valve replacement via TAVR, heart failure, and type 2 diabetes who presents complaining of of intermittent chest pain that is worse on exertion.  Patient was also found to be 89% on arrival.  Patient states that he feels extremely short of breath with this chest pain on exertion.  Patient states that he does not have any oxygen at home and has never been prescribed it. ROS: Patient currently denies any vision changes, tinnitus, difficulty speaking, facial droop, sore throat, abdominal pain, nausea/vomiting/diarrhea, dysuria, or weakness/numbness/paresthesias in any extremity   Physical Exam  Triage Vital Signs: ED Triage Vitals  Encounter Vitals Group     BP 07/18/24 1412 (!) 154/61     Girls Systolic BP Percentile --      Girls Diastolic BP Percentile --      Boys Systolic BP Percentile --      Boys Diastolic BP Percentile --      Pulse Rate 07/18/24 1412 61     Resp 07/18/24 1412 (!) 22     Temp 07/18/24 1412 97.7 F (36.5 C)     Temp Source 07/18/24 1412 Oral     SpO2 07/18/24 1412 91 %     Weight 07/18/24 1410 190 lb (86.2 kg)     Height 07/18/24 1410 6' 0.5 (1.842 m)     Head Circumference --      Peak Flow --      Pain Score 07/18/24 1409 2     Pain Loc --      Pain Education --      Exclude from Growth Chart --    Most recent vital signs: Vitals:   07/18/24 1914 07/18/24 2041  BP:  (!) 158/63  Pulse:  82  Resp:  18  Temp: (!) 97.4 F (36.3 C) 97.7 F (36.5 C)  SpO2:  94%   General: Awake, oriented x4. CV:  Good peripheral perfusion.  end systolic murmur Resp:  Normal effort. Abd:  No distention. Other:  Elderly overweight Caucasian male resting comfortably in  no acute distress ED Results / Procedures / Treatments  Labs (all labs ordered are listed, but only abnormal results are displayed) Labs Reviewed  BASIC METABOLIC PANEL WITH GFR - Abnormal; Notable for the following components:      Result Value   Glucose, Bld 162 (*)    BUN 40 (*)    Creatinine, Ser 1.90 (*)    Calcium  10.6 (*)    GFR, Estimated 36 (*)    All other components within normal limits  CBC - Abnormal; Notable for the following components:   RBC 2.85 (*)    Hemoglobin 8.8 (*)    HCT 28.2 (*)    RDW 18.9 (*)    All other components within normal limits  PRO BRAIN NATRIURETIC PEPTIDE - Abnormal; Notable for the following components:   Pro Brain Natriuretic Peptide 13,853.0 (*)    All other components within normal limits  TROPONIN T, HIGH SENSITIVITY - Abnormal; Notable for the following components:   Troponin T High Sensitivity 75 (*)    All other components within normal limits  TROPONIN T, HIGH SENSITIVITY - Abnormal; Notable for the following  components:   Troponin T High Sensitivity 69 (*)    All other components within normal limits  CBC  BASIC METABOLIC PANEL WITH GFR   EKG ED ECG REPORT I, Artist MARLA Kerns, the attending physician, personally viewed and interpreted this ECG. Date: 07/18/2024 EKG Time: 1418 Rate: 66 Rhythm: normal sinus rhythm QRS Axis: normal Intervals: normal ST/T Wave abnormalities: normal Narrative Interpretation: no evidence of acute ischemia RADIOLOGY ED MD interpretation: 2 view chest x-ray interpreted by me shows no evidence of acute abnormalities including no pneumonia, pneumothorax, or widened mediastinum - All radiology independently interpreted and agree with radiology assessment Official radiology report(s): DG Chest 2 View Result Date: 07/18/2024 CLINICAL DATA:  Chest pain and shortness of breath EXAM: CHEST - 2 VIEW COMPARISON:  07/02/2024 FINDINGS: Frontal and lateral views of the chest demonstrate stable postsurgical  changes from median sternotomy and aortic valve replacement. Cardiac silhouette is enlarged but stable. Persistent bibasilar veiling opacities consistent with consolidation and effusions, right greater than left. No pneumothorax. No acute bony abnormalities. IMPRESSION: 1. Stable bibasilar consolidation and effusions, right greater than left. 2. Stable enlarged cardiac silhouette. Electronically Signed   By: Ozell Daring M.D.   On: 07/18/2024 14:53   PROCEDURES: Critical Care performed: Yes, see critical care procedure note(s) Procedures CRITICAL CARE Performed by: Moreen Piggott K Alzina Golda  Total critical care time: 33 minutes  Critical care time was exclusive of separately billable procedures and treating other patients.  Critical care was necessary to treat or prevent imminent or life-threatening deterioration.  Critical care was time spent personally by me on the following activities: development of treatment plan with patient and/or surrogate as well as nursing, discussions with consultants, evaluation of patient's response to treatment, examination of patient, obtaining history from patient or surrogate, ordering and performing treatments and interventions, ordering and review of laboratory studies, ordering and review of radiographic studies, pulse oximetry and re-evaluation of patient's condition.  MEDICATIONS ORDERED IN ED: Medications  atorvastatin  (LIPITOR ) tablet 80 mg (80 mg Oral Given 07/18/24 2106)  carvedilol  (COREG ) tablet 3.125 mg (has no administration in time range)  isosorbide  mononitrate (IMDUR ) 24 hr tablet 120 mg (120 mg Oral Given 07/18/24 2106)  nitroGLYCERIN  (NITROSTAT ) SL tablet 0.4 mg (has no administration in time range)  empagliflozin  (JARDIANCE ) tablet 10 mg (has no administration in time range)  lactulose  (CHRONULAC ) 10 GM/15ML solution 20 g (has no administration in time range)  acetaminophen  (TYLENOL ) tablet 650 mg (has no administration in time range)    Or   acetaminophen  (TYLENOL ) suppository 650 mg (has no administration in time range)  ondansetron  (ZOFRAN ) tablet 4 mg (has no administration in time range)    Or  ondansetron  (ZOFRAN ) injection 4 mg (has no administration in time range)  furosemide  (LASIX ) injection 40 mg (has no administration in time range)  HYDROcodone -acetaminophen  (NORCO/VICODIN) 5-325 MG per tablet 1-2 tablet (has no administration in time range)  furosemide  (LASIX ) injection 40 mg (40 mg Intravenous Given 07/18/24 1914)   IMPRESSION / MDM / ASSESSMENT AND PLAN / ED COURSE  I reviewed the triage vital signs and the nursing notes.                             The patient is on the cardiac monitor to evaluate for evidence of arrhythmia and/or significant heart rate changes. Patient's presentation is most consistent with acute presentation with potential threat to life or bodily function. Patient is a 78 year old male  with the above-stated past medical history who presents complaining of exertional dyspnea with associated chest pain/pressure. DDx: ACS, aortic dissection, in-stent stenosis, pericarditis, CHF exacerbation. Plan: CBC, BMP, troponin, EKG, chest x-ray  Patient's radiologic and laboratory evaluation does show any evidence of acute abnormalities.  Patient has elevated troponin at baseline and is not elevated from his baseline.  Patient does have ambulatory desaturations to 86% and does not have any oxygen at home.  Therefore patient will require admission to the internal medicine service due to this persistent hypoxia with exertion.  I spoken to the on-call hospitalist who agrees to except this patient for further evaluation and management  Dispo: Admit to medicine   FINAL CLINICAL IMPRESSION(S) / ED DIAGNOSES   Final diagnoses:  Chronic respiratory failure with hypoxia (HCC)   Rx / DC Orders   ED Discharge Orders     None      Note:  This document was prepared using Dragon voice recognition software and  may include unintentional dictation errors.   Jossie Artist POUR, MD 07/18/24 2245

## 2024-07-18 NOTE — ED Notes (Signed)
 Patient transported to X-ray

## 2024-07-19 ENCOUNTER — Encounter: Payer: Self-pay | Admitting: Oncology

## 2024-07-19 DIAGNOSIS — I340 Nonrheumatic mitral (valve) insufficiency: Secondary | ICD-10-CM

## 2024-07-19 DIAGNOSIS — G4733 Obstructive sleep apnea (adult) (pediatric): Secondary | ICD-10-CM

## 2024-07-19 DIAGNOSIS — I480 Paroxysmal atrial fibrillation: Secondary | ICD-10-CM

## 2024-07-19 DIAGNOSIS — N1832 Chronic kidney disease, stage 3b: Secondary | ICD-10-CM

## 2024-07-19 DIAGNOSIS — D631 Anemia in chronic kidney disease: Secondary | ICD-10-CM

## 2024-07-19 DIAGNOSIS — J9601 Acute respiratory failure with hypoxia: Secondary | ICD-10-CM

## 2024-07-19 DIAGNOSIS — I5033 Acute on chronic diastolic (congestive) heart failure: Secondary | ICD-10-CM | POA: Diagnosis present

## 2024-07-19 DIAGNOSIS — I10 Essential (primary) hypertension: Secondary | ICD-10-CM

## 2024-07-19 DIAGNOSIS — J90 Pleural effusion, not elsewhere classified: Secondary | ICD-10-CM

## 2024-07-19 LAB — CBC
HCT: 22.8 % — ABNORMAL LOW (ref 39.0–52.0)
Hemoglobin: 7.2 g/dL — ABNORMAL LOW (ref 13.0–17.0)
MCH: 31.2 pg (ref 26.0–34.0)
MCHC: 31.6 g/dL (ref 30.0–36.0)
MCV: 98.7 fL (ref 80.0–100.0)
Platelets: 133 K/uL — ABNORMAL LOW (ref 150–400)
RBC: 2.31 MIL/uL — ABNORMAL LOW (ref 4.22–5.81)
RDW: 18.8 % — ABNORMAL HIGH (ref 11.5–15.5)
WBC: 6.3 K/uL (ref 4.0–10.5)
nRBC: 0 % (ref 0.0–0.2)

## 2024-07-19 LAB — BASIC METABOLIC PANEL WITH GFR
Anion gap: 7 (ref 5–15)
BUN: 41 mg/dL — ABNORMAL HIGH (ref 8–23)
CO2: 29 mmol/L (ref 22–32)
Calcium: 10.1 mg/dL (ref 8.9–10.3)
Chloride: 106 mmol/L (ref 98–111)
Creatinine, Ser: 1.93 mg/dL — ABNORMAL HIGH (ref 0.61–1.24)
GFR, Estimated: 35 mL/min — ABNORMAL LOW (ref >60)
Glucose, Bld: 162 mg/dL — ABNORMAL HIGH (ref 70–99)
Potassium: 4.3 mmol/L (ref 3.5–5.1)
Sodium: 141 mmol/L (ref 135–145)

## 2024-07-19 MED ORDER — FUROSEMIDE 10 MG/ML IJ SOLN
60.0000 mg | Freq: Two times a day (BID) | INTRAMUSCULAR | Status: DC
  Administered 2024-07-19 – 2024-07-21 (×5): 60 mg via INTRAVENOUS
  Filled 2024-07-19 (×5): qty 6

## 2024-07-19 MED ORDER — VITAMIN B-12 1000 MCG PO TABS
1000.0000 ug | ORAL_TABLET | Freq: Every day | ORAL | Status: DC
  Administered 2024-07-20 – 2024-07-23 (×4): 1000 ug via ORAL
  Filled 2024-07-19 (×4): qty 1

## 2024-07-19 MED ORDER — PANTOPRAZOLE SODIUM 40 MG PO TBEC
40.0000 mg | DELAYED_RELEASE_TABLET | Freq: Two times a day (BID) | ORAL | Status: DC
  Administered 2024-07-19 – 2024-07-23 (×8): 40 mg via ORAL
  Filled 2024-07-19 (×8): qty 1

## 2024-07-19 MED ORDER — ASPIRIN 81 MG PO TBEC
81.0000 mg | DELAYED_RELEASE_TABLET | Freq: Every day | ORAL | Status: DC
  Administered 2024-07-20 – 2024-07-23 (×4): 81 mg via ORAL
  Filled 2024-07-19 (×4): qty 1

## 2024-07-19 MED ORDER — FUROSEMIDE 10 MG/ML IJ SOLN
60.0000 mg | Freq: Once | INTRAMUSCULAR | Status: AC
  Administered 2024-07-19: 60 mg via INTRAVENOUS
  Filled 2024-07-19: qty 6

## 2024-07-19 NOTE — Progress Notes (Signed)
 Progress Note   Patient: [name] [name] DOB: 03-07-1946 DOA: 07/18/2024     0 DOS: the patient was seen and examined on 07/19/2024   Brief hospital course: 78 y.o. 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 He presented with 1 day of intermittent chest pain, shortness of breath and fatigue.  He denied cough, fever or chills or lower extremity pain.  He took a nitroglycerin  at home with some relief in chest pain.  Patient is followed by hematology and states he has been getting as needed blood transfusions for the past year and he usually feels much better after his transfusions but after the last 3 transfusions this past month he has felt more winded. In the ED mild tachypnea to 22, O2 sat initially 91% on room air.  He desatted to 82 while in the ED requiring 2 L to maintain sats in the high 90s.   Labs notable for troponin 75--69 and proBNP 13,000.  Hemoglobin 8.8, up from 7.9 about 4 days ago.  Creatinine 1.9 which is about his baseline for CKD 3B.EKG showed A-fib at 66 with RBBB Chest x-ray showed stable bibasilar consolidation and effusions right greater than left and a stable cardiac silhouette. While in the ED he desaturated to 82% and was placed on O2.  Treated with IV Lasix . Admission requested.   12/7.  Increase Lasix  to 60 mg IV twice daily.  Patient is on 3.5 L of oxygen and normally does not wear oxygen.  Hemoglobin down to 7.2.  Patient has recurrent pleural effusion  right greater than left.   Assessment and Plan: * Acute on chronic heart failure with preserved ejection fraction (HCC) Patient has a normal EF 60 to 65% but has severe mitral regurgitation and pulmonary hypertension.  Increase Lasix  to 60 mg IV twice daily.  Cardiology consultation.  Patient on Imdur , Jardiance , low-dose Coreg   Acute hypoxic respiratory failure (HCC) Patient on 3.5 L currently.  Had a pulse ox of 83% on room air  Recurrent pleural effusion on right Ultrasound thoracentesis with labs ordered for tomorrow.  Paroxysmal atrial fibrillation (HCC) Rate controlled Continue carvedilol  and aspirin --not on anticoagulation, likely due to recurrent anemia requiring transfusions  CAD (coronary artery disease), s/p CABG x 3 Had chest pain that was resolved with nitroglycerin  Troponin slightly elevated but downtrending at 75-> 69 Continue aspirin , carvedilol , atorvastatin , Imdur , nitroglycerin  sublingual as needed chest pain  Anemia in chronic kidney disease Hemoglobin 7.2 will get type and cross and likely will need blood transfusion during the hospital course.  Stage 3b chronic kidney disease (HCC) Watch closely with diuresis.  Creatinine 1.93 with a GFR of 35  OSA (obstructive sleep apnea) Patient is CPAP intolerant  Essential hypertension Continue home meds        Subjective: Patient coming back to the hospital with shortness of breath.  Admitted with CHF exacerbation and pleural effusion.  Physical Exam: Vitals:   07/19/24 0408 07/19/24 0500 07/19/24 0816 07/19/24 1223  BP: (!) 150/66  (!) 159/70 (!) 143/77  Pulse: 70  67 64  Resp:  20  (!) 22 18  Temp: 97.9 F (36.6 C)  97.8 F (36.6 C) 97.8 F (36.6 C)  TempSrc:      SpO2: 100%  97% 98%  Weight:  82.1 kg    Height:       Physical Exam HENT:     Head: Normocephalic.     Mouth/Throat:     Pharynx: No oropharyngeal exudate.  Eyes:     General: Lids are normal.     Conjunctiva/sclera: Conjunctivae  normal.  Cardiovascular:     Rate and Rhythm: Normal rate and regular rhythm.     Heart sounds: Normal heart sounds, S1 normal and S2 normal.  Pulmonary:     Breath sounds: Examination of the right-middle field reveals decreased breath sounds and rhonchi. Examination of the right-lower field reveals decreased breath sounds and rhonchi. Examination of the left-lower field reveals decreased breath sounds and rhonchi. Decreased breath sounds and rhonchi present. No wheezing or rales.  Abdominal:     Palpations: Abdomen is soft.     Tenderness: There is no abdominal tenderness.  Musculoskeletal:     Right lower leg: No swelling.     Left lower leg: No swelling.  Skin:    General: Skin is warm.     Findings: No rash.  Neurological:     Mental Status: He is alert and oriented to person, place, and time.     Data Reviewed:  Chest x-ray shows stable bibasilar consolidation and effusions right greater than left Creatinine 1.93 with a GFR of 35, proBNP 13,853, troponin 69 White blood cell count 6.3, hemoglobin 7.2, platelet count 133 Family Communication: Updated patient's wife on the phone  Disposition: Status is: Inpatient Remains inpatient appropriate because: Continue IV Lasix .  Will need thoracentesis.  Will likely also need blood transfusion.  Planned Discharge Destination: Home    Time spent: 28 minutes  Author: Charlie Patterson, MD 07/19/2024 4:32 PM  For on call review [email or web address].

## 2024-07-19 NOTE — Assessment & Plan Note (Signed)
 Ultrasound thoracentesis with labs, cytology and flow cytometry.

## 2024-07-19 NOTE — Plan of Care (Signed)
  Problem: Clinical Measurements: Goal: Ability to maintain clinical measurements within normal limits will improve Outcome: Progressing   Problem: Safety: Goal: Ability to remain free from injury will improve Outcome: Progressing   Problem: Cardiac: Goal: Ability to achieve and maintain adequate cardiopulmonary perfusion will improve Outcome: Progressing

## 2024-07-19 NOTE — Assessment & Plan Note (Addendum)
 Patient has a normal EF 60 to 65% but has severe mitral regurgitation and pulmonary hypertension.  Increase Lasix  to 60 mg IV twice daily.  Cardiology consultation.  Patient on Imdur , Jardiance , low-dose Coreg 

## 2024-07-19 NOTE — Progress Notes (Signed)
 Hematology/Oncology Consult note [facility]  [phone] [phone]  Patient Care Team: [name], MD as PCP - General (Internal Medicine) Sheldon Standing, MD as Consulting Physician (General Surgery) Janalyn Keene NOVAK, MD (Inactive) as Consulting Physician (Gastroenterology) Melanee Annah BROCKS, MD as Consulting Physician (Oncology)   Name of the patient: [name]  [id number]  Mar 22, 1946   Date of visit: 07/19/24  Diagnosis- anemia likely secondary to chronic kidney disease  Chief complaint/ Reason for visit- routine f/u of anemia  Heme/Onc history: Patient is a 78 year old male with history of multiple gastric polyps who was seen by both Dr. [name] and Dr. Janalyn.  He has undergone EGD with Dr. [name] and had a large gastric hyperplastic polyp removal.  Prior to that patient had significant iron  deficiency and his aspirin  and Plavix  were on hold.  These were restarted after EGD.  Most recent CBC on 11/01/2020 showed white count of 6.8, H&H of 11.3/36.4 with an MCV of 85 and a platelet count of 158.  Iron  studies were normal.  Patient denies any bleeding in his stool or dark melanotic stools.   Patient has been requiring IV iron  intermittently.   B12 levels have been normal in the past.  He is presently on EPO for anemia of chronic kidney disease since September 2025 Bone marrow biopsy on 06/15/2024 showed normocellular bone marrow with trilineage hematopoiesis and mild megakaryocyte dyspoiesis.  No increase in blasts. .  Cytogenetics were normal.  NGS testing did not show any evidence of clonal abnormalities.  No evidence of dysplasia  Interval history- Discussed the use of AI scribe software for clinical note transcription with the patient, who gave verbal consent to proceed.  History of Present Illness   [name] is a 78 year old male with chronic kidney disease and anemia who presents for follow-up on his  anemia management.  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 A recent bone marrow biopsy showed no abnormalities.  His hemoglobin level was 9.7 on July 07, 2024, showing improvement. He has not received an EPO injection since June 05, 2024, but received two blood transfusions around the same time. The dose of his EPO injection was increased, and his response to this adjustment is being monitored.  He experiences breathing difficulties, which his caregiver notes could lead to hospitalization if not managed promptly.      ECOG PS- 3 Pain scale- 0   Review of systems- Review of Systems  Constitutional:  Positive for malaise/fatigue. Negative for chills, fever and weight loss.  HENT:  Negative for congestion, ear discharge and nosebleeds.   Eyes:  Negative for blurred vision.  Respiratory:  Negative for cough, hemoptysis, sputum production, shortness of breath and wheezing.   Cardiovascular:  Negative for chest pain, palpitations, orthopnea and claudication.  Gastrointestinal:  Negative for abdominal pain, blood in stool, constipation, diarrhea, heartburn, melena, nausea and vomiting.  Genitourinary:  Negative for dysuria, flank pain, frequency, hematuria and urgency.  Musculoskeletal:  Negative for back pain, joint pain and myalgias.  Skin:  Negative for rash.  Neurological:  Negative for dizziness, tingling, focal weakness, seizures, weakness and headaches.  Endo/Heme/Allergies:  Does not bruise/bleed easily.  Psychiatric/Behavioral:  Negative for depression and suicidal ideas. The patient does not have insomnia.       No Known Allergies   Past Medical History:  Diagnosis Date   (HFpEF) heart failure  with preserved ejection fraction (HCC) 07/25/2016   a.)TTE 07/25/16: EF >55, triv-mild pan regur, mild AS, G2DD; b.)TTE 09/02/16: EF >55, triv TR, mild AS; c.)TTE  11/05/16: EF >55, LAE, triv-mild pan regur, mild AS, G2DD; d.)TTE 01/21/20: EF >55, mod LVH, LAE/RVE, triv-mild AR/PR/TR, mod MR, mild AS, G2DD; e.)TTE 09/24/22: EF>55, sev LVH, sev LAE, RAE, mild PR, mod AR/MR/TR, mild-mod AS, RVSP 74.7; f.)TTE 12/23/22: EF 60-65, LAE, mild-mod MR/TR, mild AS   Abnormal urine 05/01/2023   Anemia    Aortic atherosclerosis    Aortic stenosis 07/25/2016   a.) TTE 07/25/2016: mild (MPG 13.5); b.) TTE 09/02/2016: mild (MPG 16); c.) TTE 11/05/2016: mild (MPG 9); d.) TTE 01/21/2020: mild (MPG 11.7); e.) TTE 09/24/2022: mild-mod (MPG 20.3); f.) TTE 12/23/2022: mild (MPG 15)   Atrial fibrillation (HCC)    a.) CHA2DS2VASc = 6 (age x2, CHF, HTN, vascular disease history, T2DM);  b.) rate/rhythm maintained on oral labetalol ; chronically anticoagulated with apixaban  + clopidogrel    AV block, 1st degree    B12 deficiency    CKD (chronic kidney disease), stage III (HCC)    Colon polyps    Congestive heart failure (HCC) 05/01/2023   Coronary artery disease 08/16/2016   a.) s/p 3v CABG 08/30/2016; b.) s/p PCI 11/26/2016 (DES x 1 oSVG-RCA); c.) s/p PCI 12/17/2016 (DES x 5 --> mLCx x2, dLCx, pLAD, dLAD); c.) s/p PCI 06/09/2020 (DES x1 --> ISR oSVG-PDA)   Cyst of kidney, acquired 07/03/2023   Dyspnea    Gastroesophageal reflux disease 05/01/2023   GERD (gastroesophageal reflux disease)    Heart murmur    Hepatic steatosis    History of 2019 novel coronavirus disease (COVID-19) 09/02/2020   a.) Tx'd with remdesivir    History of blood transfusion    History of GI bleed 09/02/2020   a.) [facility] to [facility] 09/02/2020 - 09/08/2020   History of heart artery stent 11/26/2016   TOTAL of 7 stents: a.) 11/26/2016 --> 3.5 x 22 mm Resolute Integrity oSVG-RCA; b.) 12/17/2016: 3.0 x 12 mm Xience Alpine dLCx, 3.5 x 22 mm Resolute Onyx mLCx, 3.5 x 18 mm Resolute Onyx mLCx, 3.0 x 26 mm Resolute Onyx pLAD, 2.0 x 26 mm Resolute Onyx dLAD; c.) 06/09/2020 --> 2.5 x 18 mm Resolute Onyx ISR oSVG-PDA    History of partial colectomy 08/12/2012   a.) large gastric hyperplastic polyp removal   HLD (hyperlipidemia)    Hypertension    LBBB (left bundle branch block)    Long term current use of anticoagulant    a.) apixaban    Long term current use of antithrombotics/antiplatelets    a.) clopidogrel    Male hypogonadism    a.) on exogenous TRT (1.62% gel)   Mild cardiomegaly    Mild gynecomastia (bilateral)    Nephrolithiasis    OSA (obstructive sleep apnea)    a.) does not utilize nocturnal PAP therapy   Personal history of surgery to heart and great vessels, presenting hazards to health 05/01/2023   Proteinuria, unspecified 08/01/2023   Pulmonary hypertension (HCC) 09/24/2022   a.) TTE 09/24/2022: RVSP 74.7; b.) TTE 12/23/2022: RVSP 31.3   RBBB (right bundle branch block with left anterior fascicular block)    Right inguinal hernia    Right thyroid  nodule    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   a.) LIMA-LAD, SVG-PDA, SVG-OM3   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/01/2023   Type 2 diabetes mellitus treated with insulin  (HCC)    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/01/2023   Umbilical hernia    Unstable angina (HCC)    Vitamin D  deficiency      Past Surgical History:  Procedure Laterality Date   ANOMALOUS PULMONARY VENOUS RETURN REPAIR, TOTAL  2025   APPENDECTOMY     CARDIAC CATHETERIZATION Left 08/16/2016   Procedure: Left Heart Cath and Coronary Angiography;  Surgeon: Cara JONETTA Lovelace, MD;  Location: [facility];  Service: Cardiovascular;  Laterality: Left;   COLONOSCOPY N/A 09/07/2020   Procedure: COLONOSCOPY;  Surgeon: Janalyn Keene NOVAK, MD;  Location: [facility] ENDOSCOPY;  Service: Endoscopy;  Laterality: N/A;   COLONOSCOPY N/A 10/19/2023   Procedure: COLONOSCOPY;  Surgeon: Jinny Carmine, MD;  Location: [facility] ENDOSCOPY;  Service: Endoscopy;  Laterality: N/A;   COLONOSCOPY WITH PROPOFOL  N/A 05/17/2021   Procedure: COLONOSCOPY WITH PROPOFOL ;   Surgeon: Janalyn Keene NOVAK, MD;  Location: [facility] ENDOSCOPY;  Service: Endoscopy;  Laterality: N/A;   CORONARY ANGIOPLASTY WITH STENT PLACEMENT Left 11/26/2016   Procedure: CORONARY ANGIOPLASTY WITH STENT PLACEMENT; Location: [facility]; Surgeon: Alm Dais, MD   CORONARY ARTERY BYPASS GRAFT N/A 08/30/2016   Procedure: CORONARY ARTERY BYPASS GRAFT; Location: [facility]; Surgeon: Juliene Pouch, MD   CORONARY STENT INTERVENTION N/A 06/09/2020   Procedure: CORONARY STENT INTERVENTION;  Surgeon: Lovelace Cara JONETTA, MD;  Location: [facility];  Service: Cardiovascular;  Laterality: N/A;   CORONARY STENT INTERVENTION Left 12/17/2016   Procedure: CORONARY STENT INTERVENTION; Location: [facility]; Surgeon: Alm Dais, MD   CORONARY STENT INTERVENTION  03/2024   ENDOSCOPIC MUCOSAL RESECTION N/A 09/22/2020   Procedure: ENDOSCOPIC MUCOSAL RESECTION;  Surgeon: [name] Aloha Raddle., MD;  Location: [facility] ENDOSCOPY;  Service: Gastroenterology;  Laterality: N/A;   ESOPHAGOGASTRODUODENOSCOPY N/A 09/07/2020   Procedure: ESOPHAGOGASTRODUODENOSCOPY (EGD);  Surgeon: Janalyn Keene NOVAK, MD;  Location: [facility] ENDOSCOPY;  Service: Endoscopy;  Laterality: N/A;   ESOPHAGOGASTRODUODENOSCOPY N/A 10/19/2023   Procedure: EGD (ESOPHAGOGASTRODUODENOSCOPY);  Surgeon: Jinny Carmine, MD;  Location: [facility] ENDOSCOPY;  Service: Endoscopy;  Laterality: N/A;   ESOPHAGOGASTRODUODENOSCOPY (EGD) WITH PROPOFOL  N/A 09/22/2020   Procedure: ESOPHAGOGASTRODUODENOSCOPY (EGD) WITH PROPOFOL ;  Surgeon: [name] Aloha Raddle., MD;  Location: [facility] ENDOSCOPY;  Service: Gastroenterology;  Laterality: N/A;   ESOPHAGOGASTRODUODENOSCOPY (EGD) WITH PROPOFOL  N/A 08/26/2023   Procedure: ESOPHAGOGASTRODUODENOSCOPY (EGD) WITH PROPOFOL ;  Surgeon: Jinny Carmine, MD;  Location: [facility] ENDOSCOPY;  Service: Endoscopy;  Laterality: N/A;   FRACTURE SURGERY     HEMOSTASIS CLIP PLACEMENT  09/22/2020   Procedure: HEMOSTASIS CLIP PLACEMENT;  Surgeon: [name] Aloha Raddle., MD;   Location: [facility] ENDOSCOPY;  Service: Gastroenterology;;   HEMOSTASIS CONTROL  09/22/2020   Procedure: HEMOSTASIS CONTROL;  Surgeon: [name] Aloha Raddle., MD;  Location: [facility] ENDOSCOPY;  Service: Gastroenterology;;   INSERTION OF MESH  02/19/2023   Procedure: INSERTION OF MESH;  Surgeon: Desiderio Schanz, MD;  Location: [facility] ORS;  Service: General;;  umbilical   LAPAROSCOPIC PARTIAL RIGHT COLECTOMY Right 08/12/2012   Procedure: LAPAROSCOPIC PARTIAL RIGHT COLECTOMY; Location: [facility]; Surgeon: Unknown Sharps, MD   LEFT HEART CATH AND CORONARY ANGIOGRAPHY Left 06/09/2020   Procedure: LEFT HEART CATH AND CORONARY ANGIOGRAPHY;  Surgeon: Lovelace Cara JONETTA, MD;  Location: [facility];  Service: Cardiovascular;  Laterality: Left;   LEFT HEART CATH AND CORS/GRAFTS ANGIOGRAPHY Left 11/20/2016   Procedure: Left Heart Cath and Cors/Grafts Angiography;  Surgeon: Cara JONETTA Lovelace, MD;  Location: [facility];  Service: Cardiovascular;  Laterality: Left;   POLYPECTOMY  09/22/2020   Procedure: POLYPECTOMY;  Surgeon: Mansouraty, Aloha Raddle., MD;  Location: [facility] ENDOSCOPY;  Service: Gastroenterology;;  POLYPECTOMY  10/19/2023   Procedure: POLYPECTOMY;  Surgeon: Jinny Carmine, MD;  Location: [facility] ENDOSCOPY;  Service: Endoscopy;;   RIGHT HEART CATH Right 07/01/2024   Procedure: RIGHT HEART CATH;  Surgeon: Ammon Blunt, MD;  Location: [facility];  Service: Cardiovascular;  Laterality: Right;   SUBMUCOSAL LIFTING INJECTION  09/22/2020   Procedure: SUBMUCOSAL LIFTING INJECTION;  Surgeon: [name] Aloha Raddle., MD;  Location: [facility] ENDOSCOPY;  Service: Gastroenterology;;   UMBILICAL HERNIA REPAIR N/A 02/19/2023   Procedure: HERNIA REPAIR UMBILICAL ADULT, open;  Surgeon: Desiderio Schanz, MD;  Location: [facility] ORS;  Service: General;  Laterality: N/A;   XI ROBOTIC ASSISTED INGUINAL HERNIA REPAIR WITH MESH Right 02/19/2023   Procedure: XI ROBOTIC ASSISTED INGUINAL HERNIA REPAIR WITH MESH;  Surgeon:  Desiderio Schanz, MD;  Location: [facility] ORS;  Service: General;  Laterality: Right;    Social History   Socioeconomic History   Marital status: Married    Spouse name: Not on file   Number of children: Not on file   Years of education: Not on file   Highest education level: Not on file  Occupational History   Not on file  Tobacco Use   Smoking status: Former    Current packs/day: 0.00    Types: Cigarettes    Quit date: 2000    Years since quitting: 25.9    Passive exposure: Never   Smokeless tobacco: Never  Vaping Use   Vaping status: Never Used  Substance and Sexual Activity   Alcohol use: No   Drug use: No   Sexual activity: Not Currently  Other Topics Concern   Not on file  Social History Narrative   Not on file   Social Drivers of Health   Financial Resource Strain: Low Risk  (05/08/2024)   Received from [facility]   Overall Financial Resource Strain (CARDIA)    Difficulty of Paying Living Expenses: Not hard at all  Food Insecurity: No Food Insecurity (07/18/2024)   Hunger Vital Sign    Worried About Running Out of Food in the Last Year: Never true    Ran Out of Food in the Last Year: Never true  Transportation Needs: No Transportation Needs (07/18/2024)   PRAPARE - Administrator, Civil Service (Medical): No    Lack of Transportation (Non-Medical): No  Physical Activity: Not on file  Stress: Not on file  Social Connections: Moderately Isolated (07/18/2024)   Social Connection and Isolation Panel    Frequency of Communication with Friends and Family: More than three times a week    Frequency of Social Gatherings with Friends and Family: More than three times a week    Attends Religious Services: Never    Database Administrator or Organizations: No    Attends Banker Meetings: Never    Marital Status: Married  Catering Manager Violence: Not At Risk (07/18/2024)   Humiliation, Afraid, Rape, and Kick questionnaire    Fear of  Current or Ex-Partner: No    Emotionally Abused: No    Physically Abused: No    Sexually Abused: No    Family History  Problem Relation Age of Onset   Hyperlipidemia Mother    Heart disease Mother    Hypertension Father     No current facility-administered medications for this visit. No current outpatient medications on file.  Facility-Administered Medications Ordered in Other Visits:    acetaminophen  (TYLENOL ) tablet 650 mg, 650 mg, Oral, Q6H PRN **OR** acetaminophen  (TYLENOL ) suppository 650  mg, 650 mg, Rectal, Q6H PRN, Cleatus Delayne GAILS, MD   atorvastatin  (LIPITOR ) tablet 80 mg, 80 mg, Oral, Daily, Cleatus Delayne GAILS, MD, 80 mg at 07/18/24 2106   carvedilol  (COREG ) tablet 3.125 mg, 3.125 mg, Oral, BID WC, Duncan, Hazel V, MD, 3.125 mg at 07/19/24 9096   empagliflozin  (JARDIANCE ) tablet 10 mg, 10 mg, Oral, QAC breakfast, Duncan, Hazel V, MD, 10 mg at 07/19/24 9096   furosemide  (LASIX ) injection 60 mg, 60 mg, Intravenous, BID, Wieting, Richard, MD   HYDROcodone -acetaminophen  (NORCO/VICODIN) 5-325 MG per tablet 1-2 tablet, 1-2 tablet, Oral, Q4H PRN, Cleatus Delayne GAILS, MD   isosorbide  mononitrate (IMDUR ) 24 hr tablet 120 mg, 120 mg, Oral, Daily, Cleatus Delayne V, MD, 120 mg at 07/19/24 9096   lactulose  (CHRONULAC ) 10 GM/15ML solution 20 g, 20 g, Oral, BID PRN, Cleatus Delayne GAILS, MD   nitroGLYCERIN  (NITROSTAT ) SL tablet 0.4 mg, 0.4 mg, Sublingual, Q5 min PRN, Cleatus Delayne GAILS, MD   ondansetron  (ZOFRAN ) tablet 4 mg, 4 mg, Oral, Q6H PRN **OR** ondansetron  (ZOFRAN ) injection 4 mg, 4 mg, Intravenous, Q6H PRN, Cleatus Delayne GAILS, MD  Physical exam:  Vitals:   07/14/24 1412 07/14/24 1436  BP: (!) 140/59 139/60  Pulse:  65  Resp: 19   Temp: (!) 95.8 F (35.4 C)   TempSrc: Tympanic   SpO2: 95%   Weight: 191 lb 3.2 oz (86.7 kg)   Height: 6' 1 (1.854 m)    Physical Exam Constitutional:      Comments: Sitting in a wheelchair. Appears fatigued  Cardiovascular:     Rate and Rhythm: Normal rate and  regular rhythm.     Heart sounds: Normal heart sounds.  Pulmonary:     Effort: Pulmonary effort is normal.     Breath sounds: Normal breath sounds.  Skin:    General: Skin is warm and dry.  Neurological:     Mental Status: He is alert and oriented to person, place, and time.      I have personally reviewed labs listed below:    Latest Ref Rng & Units 07/19/2024    5:47 AM  CMP  Glucose 70 - 99 mg/dL 837   BUN 8 - 23 mg/dL 41   Creatinine 9.38 - 1.24 mg/dL 8.06   Sodium 864 - 854 mmol/L 141   Potassium 3.5 - 5.1 mmol/L 4.3   Chloride 98 - 111 mmol/L 106   CO2 22 - 32 mmol/L 29   Calcium  8.9 - 10.3 mg/dL 89.8       Latest Ref Rng & Units 07/19/2024    5:47 AM  CBC  WBC 4.0 - 10.5 K/uL 6.3   Hemoglobin 13.0 - 17.0 g/dL 7.2   Hematocrit 60.9 - 52.0 % 22.8   Platelets 150 - 400 K/uL 133    I have personally reviewed Radiology images listed below:   DG Chest 2 View Result Date: 07/18/2024 CLINICAL DATA:  Chest pain and shortness of breath EXAM: CHEST - 2 VIEW COMPARISON:  07/02/2024 FINDINGS: Frontal and lateral views of the chest demonstrate stable postsurgical changes from median sternotomy and aortic valve replacement. Cardiac silhouette is enlarged but stable. Persistent bibasilar veiling opacities consistent with consolidation and effusions, right greater than left. No pneumothorax. No acute bony abnormalities. IMPRESSION: 1. Stable bibasilar consolidation and effusions, right greater than left. 2. Stable enlarged cardiac silhouette. Electronically Signed   By: Ozell Daring M.D.   On: 07/18/2024 14:53   US  Venous Img Lower Unilateral Left (DVT) Result Date:  07/02/2024 CLINICAL DATA:  Swelling. EXAM: LEFT LOWER EXTREMITY VENOUS DOPPLER ULTRASOUND TECHNIQUE: Gray-scale sonography with graded compression, as well as color Doppler and duplex ultrasound were performed to evaluate the lower extremity deep venous systems from the level of the common femoral vein and including the  common femoral, femoral, profunda femoral, popliteal and calf veins including the posterior tibial, peroneal and gastrocnemius veins when visible. Spectral Doppler was utilized to evaluate flow at rest and with distal augmentation maneuvers in the common femoral, femoral and popliteal veins. COMPARISON:  12/20/2022 FINDINGS: Contralateral Common Femoral Vein: Respiratory phasicity is normal and symmetric with the symptomatic side. No evidence of thrombus. Normal compressibility. Common Femoral Vein: No evidence of thrombus. Normal compressibility, respiratory phasicity and response to augmentation. Saphenofemoral Junction: No evidence of thrombus. Normal compressibility and flow on color Doppler imaging. Profunda Femoral Vein: No evidence of thrombus. Normal compressibility and flow on color Doppler imaging. Femoral Vein: No evidence of thrombus. Normal compressibility, respiratory phasicity and response to augmentation. Popliteal Vein: No evidence of thrombus. Normal compressibility, respiratory phasicity and response to augmentation. Calf Veins: No evidence of thrombus. Normal compressibility and flow on color Doppler imaging. Other Findings:  None. IMPRESSION: Negative for deep venous thrombosis in left lower extremity. Electronically Signed   By: Juliene Balder M.D.   On: 07/02/2024 17:01   US  THORACENTESIS ASP PLEURAL SPACE W/IMG GUIDE Result Date: 07/02/2024 INDICATION: 78 year old male presents with dyspnea and right-sided pleural effusion. Received request for diagnostic and therapeutic thoracentesis. EXAM: ULTRASOUND GUIDED DIAGNOSTIC AND THERAPEUTIC, RIGHT-SIDED THORACENTESIS MEDICATIONS: 10 mL 1% lidocaine  COMPLICATIONS: None immediate. PROCEDURE: An ultrasound guided thoracentesis was thoroughly discussed with the patient and questions answered. The benefits, risks, alternatives and complications were also discussed. The patient understands and wishes to proceed with the procedure. Written consent was  obtained. Ultrasound was performed to localize and mark an adequate pocket of fluid in the right chest. The area was then prepped and draped in the normal sterile fashion. 1% lidocaine  was used for local anesthesia. Under ultrasound guidance a 6 Fr Safe-T-Centesis catheter was introduced. Thoracentesis was performed. The catheter was removed and a dressing applied. FINDINGS: A total of approximately 700 mL of amber fluid was removed. Samples were sent to the laboratory as requested by the clinical team. IMPRESSION: Successful ultrasound guided right thoracentesis yielding 700 mL of pleural fluid. Performed by: Rayfield Buff, NP under the direct supervision of Dr Cordella Banner Electronically Signed   By: Cordella Banner   On: 07/02/2024 16:09   DG Chest Port 1 View Result Date: 07/02/2024 CLINICAL DATA:  Status post thoracentesis EXAM: PORTABLE CHEST 1 VIEW COMPARISON:  Chest x-ray 07/02/2024 FINDINGS: There are small bilateral pleural effusions, decreased on the right. There is no pneumothorax. Cardiomediastinal silhouette is enlarged unchanged. Sternotomy wires and fasteners are again seen. IMPRESSION: Small bilateral pleural effusions, decreased on the right. No pneumothorax. Electronically Signed   By: Greig Pique M.D.   On: 07/02/2024 15:53   DG Chest Port 1 View Result Date: 07/02/2024 EXAM: 1 VIEW(S) XRAY OF THE CHEST 07/02/2024 07:30:00 AM COMPARISON: 07/01/2024 CLINICAL HISTORY: Shortness of breath FINDINGS: LUNGS AND PLEURA: Mild pulmonary edema. Moderate right and small left pleural effusions with associated bibasilar opacities. No pneumothorax. HEART AND MEDIASTINUM: Cardiomegaly, unchanged. Prior CABG and aortic valve replacement noted. BONES AND SOFT TISSUES: No acute osseous abnormality. IMPRESSION: 1. Mild pulmonary edema. 2. Moderate right and small left pleural effusions 3. No change in bilateral pulmonary opacities, which may reflect areas of  atelectasis and/or consolidation.  Electronically signed by: Waddell Calk MD 07/02/2024 11:40 AM EST RP Workstation: [id number]   DG Chest 2 View Result Date: 07/01/2024 CLINICAL DATA:  Shortness of breath. EXAM: CHEST - 2 VIEW COMPARISON:  03/03/2024. FINDINGS: Cardiomegaly, unchanged. Prior valve replacement and CABG. Median sternotomy. Aortic atherosclerosis. Moderate right and small left pleural effusions with bibasilar opacities, likely reflecting atelectasis. No pneumothorax. No acute osseous abnormality. IMPRESSION: 1. Moderate right and small left pleural effusions with bibasilar atelectasis. 2. Cardiomegaly. Electronically Signed   By: Harrietta Sherry M.D.   On: 07/01/2024 13:26   CARDIAC CATHETERIZATION Result Date: 07/01/2024   Hemodynamic findings consistent with severe pulmonary hypertension. 1.   RA 15/15 mmHg  RV 87/21  PA 86/42  (51)  PCWP 45/24  (32) 2.  Severe pulmonary hypertension      Assessment and plan- Patient is a 78 y.o. 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  I discussed the results of the bone marrow biopsy which shows a normocellular bone marrow with trilineage hematopoiesis.  Mild dyspoiesis noted in the megakaryocyte lineage but not enough to correlate MDS.  Moreover cytogenetics, NGS testing were normal and not suggestive of any clonal dysplastic process.  Peripheral blood workup was also not revealing and as of now the tentative cause of anemia is his chronic kidney disease for which I am giving him EPO.  The concern however is that despite increasing the dose of EPO his hemoglobin has mainly been between 7-8 and has been requiring intermittent blood transfusions.   - Administered EPO injection today. - Ordered blood work today. - Plan weekly blood work on Wednesdays for the next two weeks. - Schedule potential transfusion for Thursday or Friday if hemoglobin is low.  Labs for  possible transfusion next week and we will continue to monitor his H&H every 2 weeks and I will see him back in 3 months with CBC ferritin and iron  studies.  He will continue to receive EPO every month.  If anemia does not get better despite EPO I will refer him to [facility] for second opinion        Visit Diagnosis 1. Symptomatic anemia   2. Anemia in stage 4 chronic kidney disease (HCC)      Dr. Annah Skene, MD, MPH [facility] [phone] 07/19/2024 10:33 AM

## 2024-07-19 NOTE — Consult Note (Signed)
 CARDIOLOGY CONSULT NOTE               Patient ID: [name] MRN: [id number] DOB/AGE: 11/03/1945 78 y.o.  Admit date: 07/18/2024 Referring Physician Dr. Delayne Solian hospitalist Primary Physician Dr. Sadie primary Primary Cardiologist Dr. Ammon Reason for Consultation shortness of breath HFpEF heart failure  HPI: 78 year old male history of multiple medical problems multivessel coronary disease coronary bypass surgery failed bypass requiring multivessel stenting hypertension 60 renal insufficiency stage IIIb paroxysmal atrial fibrillation poor anticoagulation candidate profound anemia HFpEF pleural effusion status post thoracentesis pulmonary hypertension by right heart cath recently hospitalized but after a week we presented with worsening shortness of breath dyspnea hypoxemia and reaccumulating pleural effusions patient has severe valvular regurgitation denies any chest pain  Review of systems complete and found to be negative unless listed above     Past Medical History:  Diagnosis Date   (HFpEF) heart failure with preserved ejection fraction (HCC) 07/25/2016   a.)TTE 07/25/16: EF >55, triv-mild pan regur, mild AS, G2DD; b.)TTE 09/02/16: EF >55, triv TR, mild AS; c.)TTE 11/05/16: EF >55, LAE, triv-mild pan regur, mild AS, G2DD; d.)TTE 01/21/20: EF >55, mod LVH, LAE/RVE, triv-mild AR/PR/TR, mod MR, mild AS, G2DD; e.)TTE 09/24/22: EF>55, sev LVH, sev LAE, RAE, mild PR, mod AR/MR/TR, mild-mod AS, RVSP 74.7; f.)TTE 12/23/22: EF 60-65, LAE, mild-mod MR/TR, mild AS   Abnormal urine 05/01/2023   Anemia    Aortic atherosclerosis    Aortic stenosis 07/25/2016   a.) TTE 07/25/2016: mild (MPG 13.5); b.) TTE 09/02/2016: mild (MPG 16); c.) TTE 11/05/2016: mild (MPG 9); d.) TTE 01/21/2020: mild (MPG 11.7); e.) TTE 09/24/2022: mild-mod (MPG 20.3); f.) TTE 12/23/2022: mild (MPG 15)   Atrial fibrillation (HCC)    a.) CHA2DS2VASc = 6 (age x2, CHF, HTN, vascular disease history, T2DM);  b.)  rate/rhythm maintained on oral labetalol ; chronically anticoagulated with apixaban  + clopidogrel    AV block, 1st degree    B12 deficiency    CKD (chronic kidney disease), stage III (HCC)    Colon polyps    Congestive heart failure (HCC) 05/01/2023   Coronary artery disease 08/16/2016   a.) s/p 3v CABG 08/30/2016; b.) s/p PCI 11/26/2016 (DES x 1 oSVG-RCA); c.) s/p PCI 12/17/2016 (DES x 5 --> mLCx x2, dLCx, pLAD, dLAD); c.) s/p PCI 06/09/2020 (DES x1 --> ISR oSVG-PDA)   Cyst of kidney, acquired 07/03/2023   Dyspnea    Gastroesophageal reflux disease 05/01/2023   GERD (gastroesophageal reflux disease)    Heart murmur    Hepatic steatosis    History of 2019 novel coronavirus disease (COVID-19) 09/02/2020   a.) Tx'd with remdesivir    History of blood transfusion    History of GI bleed 09/02/2020   a.) admitted to [facility] 09/02/2020 - 09/08/2020   History of heart artery stent 11/26/2016   TOTAL of 7 stents: a.) 11/26/2016 --> 3.5 x 22 mm Resolute Integrity oSVG-RCA; b.) 12/17/2016: 3.0 x 12 mm Xience Alpine dLCx, 3.5 x 22 mm Resolute Onyx mLCx, 3.5 x 18 mm Resolute Onyx mLCx, 3.0 x 26 mm Resolute Onyx pLAD, 2.0 x 26 mm Resolute Onyx dLAD; c.) 06/09/2020 --> 2.5 x 18 mm Resolute Onyx ISR oSVG-PDA   History of partial colectomy 08/12/2012   a.) large gastric hyperplastic polyp removal   HLD (hyperlipidemia)    Hypertension    LBBB (left bundle branch block)    Long term current use of anticoagulant    a.) apixaban    Long term current use of  antithrombotics/antiplatelets    a.) clopidogrel    Male hypogonadism    a.) on exogenous TRT (1.62% gel)   Mild cardiomegaly    Mild gynecomastia (bilateral)    Nephrolithiasis    OSA (obstructive sleep apnea)    a.) does not utilize nocturnal PAP therapy   Personal history of surgery to heart and great vessels, presenting hazards to health 05/01/2023   Proteinuria, unspecified 08/01/2023   Pulmonary hypertension (HCC) 09/24/2022   a.) TTE  09/24/2022: RVSP 74.7; b.) TTE 12/23/2022: RVSP 31.3   RBBB (right bundle branch block with left anterior fascicular block)    Right inguinal hernia    Right thyroid  nodule    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   a.) LIMA-LAD, SVG-PDA, SVG-OM3   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/01/2023   Type 2 diabetes mellitus treated with insulin  (HCC)    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   Umbilical hernia    Unstable angina (HCC)    Vitamin D  deficiency     Past Surgical History:  Procedure Laterality Date   ANOMALOUS PULMONARY VENOUS RETURN REPAIR, TOTAL  2025   APPENDECTOMY     CARDIAC CATHETERIZATION Left 08/16/2016   Procedure: Left Heart Cath and Coronary Angiography;  Surgeon: Cara JONETTA Lovelace, MD;  Location: [facility] IN[facility]ASIVE CV [facility];  Service: Cardiovascular;  Laterality: Left;   COLONOSCOPY N/A 09/07/2020   Procedure: COLONOSCOPY;  Surgeon: Janalyn Keene NOVAK, MD;  Location: [facility] ENDOSCOPY;  Service: Endoscopy;  Laterality: N/A;   COLONOSCOPY N/A 10/19/2023   Procedure: COLONOSCOPY;  Surgeon: Jinny Carmine, MD;  Location: [facility] ENDOSCOPY;  Service: Endoscopy;  Laterality: N/A;   COLONOSCOPY WITH PROPOFOL  N/A 05/17/2021   Procedure: COLONOSCOPY WITH PROPOFOL ;  Surgeon: Janalyn Keene NOVAK, MD;  Location: [facility] ENDOSCOPY;  Service: Endoscopy;  Laterality: N/A;   CORONARY ANGIOPLASTY WITH STENT PLACEMENT Left 11/26/2016   Procedure: CORONARY ANGIOPLASTY WITH STENT PLACEMENT; Location: [facility]; Surgeon: Alm Dais, MD   CORONARY ARTERY BYPASS GRAFT N/A 08/30/2016   Procedure: CORONARY ARTERY BYPASS GRAFT; Location: [facility]; Surgeon: Juliene Pouch, MD   CORONARY STENT INTERVENTION N/A 06/09/2020   Procedure: CORONARY STENT INTERVENTION;  Surgeon: Lovelace Cara JONETTA, MD;  Location: [facility];  Service: Cardiovascular;  Laterality: N/A;   CORONARY STENT INTERVENTION Left 12/17/2016   Procedure: CORONARY STENT INTERVENTION; Location: [facility]; Surgeon:  Alm Dais, MD   CORONARY STENT INTERVENTION  03/2024   ENDOSCOPIC MUCOSAL RESECTION N/A 09/22/2020   Procedure: ENDOSCOPIC MUCOSAL RESECTION;  Surgeon: [name] Aloha Raddle., MD;  Location: [facility] ENDOSCOPY;  Service: Gastroenterology;  Laterality: N/A;   ESOPHAGOGASTRODUODENOSCOPY N/A 09/07/2020   Procedure: ESOPHAGOGASTRODUODENOSCOPY (EGD);  Surgeon: Janalyn Keene NOVAK, MD;  Location: [facility] ENDOSCOPY;  Service: Endoscopy;  Laterality: N/A;   ESOPHAGOGASTRODUODENOSCOPY N/A 10/19/2023   Procedure: EGD (ESOPHAGOGASTRODUODENOSCOPY);  Surgeon: Jinny Carmine, MD;  Location: [facility] ENDOSCOPY;  Service: Endoscopy;  Laterality: N/A;   ESOPHAGOGASTRODUODENOSCOPY (EGD) WITH PROPOFOL  N/A 09/22/2020   Procedure: ESOPHAGOGASTRODUODENOSCOPY (EGD) WITH PROPOFOL ;  Surgeon: [name] Aloha Raddle., MD;  Location: [facility] ENDOSCOPY;  Service: Gastroenterology;  Laterality: N/A;   ESOPHAGOGASTRODUODENOSCOPY (EGD) WITH PROPOFOL  N/A 08/26/2023   Procedure: ESOPHAGOGASTRODUODENOSCOPY (EGD) WITH PROPOFOL ;  Surgeon: Jinny Carmine, MD;  Location: [facility] ENDOSCOPY;  Service: Endoscopy;  Laterality: N/A;   FRACTURE SURGERY     HEMOSTASIS CLIP PLACEMENT  09/22/2020   Procedure: HEMOSTASIS CLIP PLACEMENT;  Surgeon: [name] Aloha Raddle., MD;  Location: [facility] ENDOSCOPY;  Service: Gastroenterology;;   HEMOSTASIS CONTROL  09/22/2020   Procedure: HEMOSTASIS CONTROL;  Surgeon: [name] Aloha Raddle., MD;  Location: [facility] ENDOSCOPY;  Service: Gastroenterology;;   INSERTION OF MESH  02/19/2023   Procedure: INSERTION OF MESH;  Surgeon: Desiderio Schanz, MD;  Location: [facility] ORS;  Service: General;;  umbilical   LAPAROSCOPIC PARTIAL RIGHT COLECTOMY Right 08/12/2012   Procedure: LAPAROSCOPIC PARTIAL RIGHT COLECTOMY; Location: [facility]; Surgeon: Unknown Sharps, MD   LEFT HEART CATH AND CORONARY ANGIOGRAPHY Left 06/09/2020   Procedure: LEFT HEART CATH AND CORONARY ANGIOGRAPHY;  Surgeon: Florencio Cara BIRCH, MD;  Location: [facility];  Service:  Cardiovascular;  Laterality: Left;   LEFT HEART CATH AND CORS/GRAFTS ANGIOGRAPHY Left 11/20/2016   Procedure: Left Heart Cath and Cors/Grafts Angiography;  Surgeon: Cara BIRCH Florencio, MD;  Location: [facility];  Service: Cardiovascular;  Laterality: Left;   POLYPECTOMY  09/22/2020   Procedure: POLYPECTOMY;  Surgeon: [name] Aloha Raddle., MD;  Location: [facility] ENDOSCOPY;  Service: Gastroenterology;;   POLYPECTOMY  10/19/2023   Procedure: POLYPECTOMY;  Surgeon: Jinny Carmine, MD;  Location: [facility] ENDOSCOPY;  Service: Endoscopy;;   RIGHT HEART CATH Right 07/01/2024   Procedure: RIGHT HEART CATH;  Surgeon: Ammon Blunt, MD;  Location: [facility];  Service: Cardiovascular;  Laterality: Right;   SUBMUCOSAL LIFTING INJECTION  09/22/2020   Procedure: SUBMUCOSAL LIFTING INJECTION;  Surgeon: [name] Aloha Raddle., MD;  Location: [facility] ENDOSCOPY;  Service: Gastroenterology;;   UMBILICAL HERNIA REPAIR N/A 02/19/2023   Procedure: HERNIA REPAIR UMBILICAL ADULT, open;  Surgeon: Desiderio Schanz, MD;  Location: [facility] ORS;  Service: General;  Laterality: N/A;   XI ROBOTIC ASSISTED INGUINAL HERNIA REPAIR WITH MESH Right 02/19/2023   Procedure: XI ROBOTIC ASSISTED INGUINAL HERNIA REPAIR WITH MESH;  Surgeon: Desiderio Schanz, MD;  Location: [facility] ORS;  Service: General;  Laterality: Right;    Medications Prior to Admission  Medication Sig Dispense Refill Last Dose/Taking   amLODipine  (NORVASC ) 10 MG tablet Take 10 mg by mouth daily.      aspirin  EC 81 MG tablet Take 81 mg by mouth daily.      atorvastatin  (LIPITOR ) 80 MG tablet Take 80 mg by mouth.  Take 80 mg by mouth in the morning.      bisacodyl  (DULCOLAX) 5 MG EC tablet Take 5 mg by mouth as needed for moderate constipation.      carvedilol  (COREG ) 3.125 MG tablet Take 1 tablet (3.125 mg total) by mouth 2 (two) times daily with a meal. 60 tablet 1    clopidogrel  (PLAVIX ) 75 MG tablet Take 75 mg by mouth daily.      cyanocobalamin  (VITAMIN  B12) 1000 MCG tablet Take 1,000 mcg by mouth daily.      dexlansoprazole (DEXILANT) 60 MG capsule Take 1 capsule by mouth daily.      empagliflozin  (JARDIANCE ) 10 MG TABS tablet Take 1 tablet (10 mg total) by mouth daily before breakfast. 30 tablet 0    Ferrous Sulfate  (IRON ) 325 (65 Fe) MG TABS Take 1 tablet by mouth daily.      isosorbide  mononitrate (IMDUR ) 120 MG 24 hr tablet Take 120 mg by mouth daily.      lactulose  (CHRONULAC ) 10 GM/15ML solution Taking 15-20 cc by mouth 3 to 4 times a day for constiption (Patient taking differently: Taking 15-20 cc by mouth 3 to 4 times a day for constiption as needed) 946 mL 2    nitroGLYCERIN  (NITROSTAT ) 0.4 MG SL tablet Place 0.4 mg under the tongue every 5 (five) minutes as needed.      torsemide  (DEMADEX ) 20  MG tablet Take 20 mg by mouth daily.      Social History   Socioeconomic History   Marital status: Married    Spouse name: Not on file   Number of children: Not on file   Years of education: Not on file   Highest education level: Not on file  Occupational History   Not on file  Tobacco Use   Smoking status: Former    Current packs/day: 0.00    Types: Cigarettes    Quit date: 2000    Years since quitting: 25.9    Passive exposure: Never   Smokeless tobacco: Never  Vaping Use   Vaping status: Never Used  Substance and Sexual Activity   Alcohol use: No   Drug use: No   Sexual activity: Not Currently  Other Topics Concern   Not on file  Social History Narrative   Not on file   Social Drivers of Health   Financial Resource Strain: Low Risk  (05/08/2024)   Received from [facility]   Overall Financial Resource Strain (CARDIA)    Difficulty of Paying Living Expenses: Not hard at all  Food Insecurity: No Food Insecurity (07/18/2024)   Hunger Vital Sign    Worried About Running Out of Food in the Last Year: Never true    Ran Out of Food in the Last Year: Never true  Transportation Needs: No Transportation Needs  (07/18/2024)   PRAPARE - Administrator, Civil Service (Medical): No    Lack of Transportation (Non-Medical): No  Physical Activity: Not on file  Stress: Not on file  Social Connections: Moderately Isolated (07/18/2024)   Social Connection and Isolation Panel    Frequency of Communication with Friends and Family: More than three times a week    Frequency of Social Gatherings with Friends and Family: More than three times a week    Attends Religious Services: Never    Database Administrator or Organizations: No    Attends Banker Meetings: Never    Marital Status: Married  Catering Manager Violence: Not At Risk (07/18/2024)   Humiliation, Afraid, Rape, and Kick questionnaire    Fear of Current or Ex-Partner: No    Emotionally Abused: No    Physically Abused: No    Sexually Abused: No    Family History  Problem Relation Age of Onset   Hyperlipidemia Mother    Heart disease Mother    Hypertension Father       Review of systems complete and found to be negative unless listed above      PHYSICAL EXAM  General: Well developed, well nourished, in no acute distress HEENT:  Normocephalic and atramatic Neck:  No JVD.  Lungs: Clear bilaterally to auscultation and percussion.  Dullness in the bases Heart: Irregular irregular. Normal S1 and S2 without gallops or 3/6 sem murmurs.  Abdomen: Bowel sounds are positive, abdomen soft and non-tender  Msk:  Back normal, normal gait. Normal strength and tone for age. Extremities: No clubbing, cyanosis or 2+ edema.   Neuro: Alert and oriented X 3. Psych:  Good affect, responds appropriately  Labs:   Lab Results  Component Value Date   WBC 6.3 07/19/2024   HGB 7.2 (L) 07/19/2024   HCT 22.8 (L) 07/19/2024   MCV 98.7 07/19/2024   PLT 133 (L) 07/19/2024    Recent Labs  Lab 07/19/24 0547  NA 141  K 4.3  CL 106  CO2 29  BUN 41*  CREATININE 1.93*  CALCIUM  10.1  GLUCOSE 162*   No results found for:  CKTOTAL, CKMB, CKMBINDEX, TROPONINI  Lab Results  Component Value Date   CHOL 85 12/21/2022   Lab Results  Component Value Date   HDL 45 12/21/2022   Lab Results  Component Value Date   LDLCALC 29 12/21/2022   Lab Results  Component Value Date   TRIG 53 12/21/2022   Lab Results  Component Value Date   CHOLHDL 1.9 12/21/2022   No results found for: LDLDIRECT    Radiology: DG Chest 2 View Result Date: 07/18/2024 CLINICAL DATA:  Chest pain and shortness of breath EXAM: CHEST - 2 VIEW COMPARISON:  07/02/2024 FINDINGS: Frontal and lateral views of the chest demonstrate stable postsurgical changes from median sternotomy and aortic valve replacement. Cardiac silhouette is enlarged but stable. Persistent bibasilar veiling opacities consistent with consolidation and effusions, right greater than left. No pneumothorax. No acute bony abnormalities. IMPRESSION: 1. Stable bibasilar consolidation and effusions, right greater than left. 2. Stable enlarged cardiac silhouette. Electronically Signed   By: Ozell Daring M.D.   On: 07/18/2024 14:53   US  Venous Img Lower Unilateral Left (DVT) Result Date: 07/02/2024 CLINICAL DATA:  Swelling. EXAM: LEFT LOWER EXTREMITY VENOUS DOPPLER ULTRASOUND TECHNIQUE: Gray-scale sonography with graded compression, as well as color Doppler and duplex ultrasound were performed to evaluate the lower extremity deep venous systems from the level of the common femoral vein and including the common femoral, femoral, profunda femoral, popliteal and calf veins including the posterior tibial, peroneal and gastrocnemius veins when visible. Spectral Doppler was utilized to evaluate flow at rest and with distal augmentation maneuvers in the common femoral, femoral and popliteal veins. COMPARISON:  12/20/2022 FINDINGS: Contralateral Common Femoral Vein: Respiratory phasicity is normal and symmetric with the symptomatic side. No evidence of thrombus. Normal compressibility.  Common Femoral Vein: No evidence of thrombus. Normal compressibility, respiratory phasicity and response to augmentation. Saphenofemoral Junction: No evidence of thrombus. Normal compressibility and flow on color Doppler imaging. Profunda Femoral Vein: No evidence of thrombus. Normal compressibility and flow on color Doppler imaging. Femoral Vein: No evidence of thrombus. Normal compressibility, respiratory phasicity and response to augmentation. Popliteal Vein: No evidence of thrombus. Normal compressibility, respiratory phasicity and response to augmentation. Calf Veins: No evidence of thrombus. Normal compressibility and flow on color Doppler imaging. Other Findings:  None. IMPRESSION: Negative for deep venous thrombosis in left lower extremity. Electronically Signed   By: Juliene Balder M.D.   On: 07/02/2024 17:01   US  THORACENTESIS ASP PLEURAL SPACE W/IMG GUIDE Result Date: 07/02/2024 INDICATION: 78 year old male presents with dyspnea and right-sided pleural effusion. Received request for diagnostic and therapeutic thoracentesis. EXAM: ULTRASOUND GUIDED DIAGNOSTIC AND THERAPEUTIC, RIGHT-SIDED THORACENTESIS MEDICATIONS: 10 mL 1% lidocaine  COMPLICATIONS: None immediate. PROCEDURE: An ultrasound guided thoracentesis was thoroughly discussed with the patient and questions answered. The benefits, risks, alternatives and complications were also discussed. The patient understands and wishes to proceed with the procedure. Written consent was obtained. Ultrasound was performed to localize and mark an adequate pocket of fluid in the right chest. The area was then prepped and draped in the normal sterile fashion. 1% lidocaine  was used for local anesthesia. Under ultrasound guidance a 6 Fr Safe-T-Centesis catheter was introduced. Thoracentesis was performed. The catheter was removed and a dressing applied. FINDINGS: A total of approximately 700 mL of amber fluid was removed. Samples were sent to the laboratory as requested  by the clinical team. IMPRESSION: Successful ultrasound guided right  thoracentesis yielding 700 mL of pleural fluid. Performed by: Rayfield Buff, NP under the direct supervision of Dr Cordella Banner Electronically Signed   By: Cordella Banner   On: 07/02/2024 16:09   DG Chest Port 1 View Result Date: 07/02/2024 CLINICAL DATA:  Status post thoracentesis EXAM: PORTABLE CHEST 1 VIEW COMPARISON:  Chest x-ray 07/02/2024 FINDINGS: There are small bilateral pleural effusions, decreased on the right. There is no pneumothorax. Cardiomediastinal silhouette is enlarged unchanged. Sternotomy wires and fasteners are again seen. IMPRESSION: Small bilateral pleural effusions, decreased on the right. No pneumothorax. Electronically Signed   By: Greig Pique M.D.   On: 07/02/2024 15:53   DG Chest Port 1 View Result Date: 07/02/2024 EXAM: 1 VIEW(S) XRAY OF THE CHEST 07/02/2024 07:30:00 AM COMPARISON: 07/01/2024 CLINICAL HISTORY: Shortness of breath FINDINGS: LUNGS AND PLEURA: Mild pulmonary edema. Moderate right and small left pleural effusions with associated bibasilar opacities. No pneumothorax. HEART AND MEDIASTINUM: Cardiomegaly, unchanged. Prior CABG and aortic valve replacement noted. BONES AND SOFT TISSUES: No acute osseous abnormality. IMPRESSION: 1. Mild pulmonary edema. 2. Moderate right and small left pleural effusions 3. No change in bilateral pulmonary opacities, which may reflect areas of atelectasis and/or consolidation. Electronically signed by: Waddell Calk MD 07/02/2024 11:40 AM EST RP Workstation: [id number]   DG Chest 2 View Result Date: 07/01/2024 CLINICAL DATA:  Shortness of breath. EXAM: CHEST - 2 VIEW COMPARISON:  03/03/2024. FINDINGS: Cardiomegaly, unchanged. Prior valve replacement and CABG. Median sternotomy. Aortic atherosclerosis. Moderate right and small left pleural effusions with bibasilar opacities, likely reflecting atelectasis. No pneumothorax. No acute osseous abnormality.  IMPRESSION: 1. Moderate right and small left pleural effusions with bibasilar atelectasis. 2. Cardiomegaly. Electronically Signed   By: Harrietta Sherry M.D.   On: 07/01/2024 13:26   CARDIAC CATHETERIZATION Result Date: 07/01/2024   Hemodynamic findings consistent with severe pulmonary hypertension. 1.   RA 15/15 mmHg  RV 87/21  PA 86/42  (51)  PCWP 45/24  (32) 2.  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 Renal insufficiency Recurrent anemia Atrial fibrillation  Plan Agree admit supplemental oxygen diuresis for recurrent heart failure Recommend thoracentesis for bilateral pleural effusions GDMT for HFpEF SGLT2 MRA diuretics ARB or Entresto Aggressive IV diuresis for valvular regurgitation Have the patient follow-up with nephrology for significant renal insufficiency stage IIIb Pulm hypertension moderate to severe wedge mean of 32 PA mean of 51 Not likely to benefit from a Watchman because of significant pulmonary hypertension Have patient follow-up with pulmonary for significant pulm hypertension management Seen evaluated by EP for consideration of ablation for paroxysmal atrial fibrillation with HFpEF to potentially improve his respiratory status and cardiac function Chronic anemia hemoglobin is 7 continue to transfuse such as supplemental iron  refer to hematology  Signed: Cara JONETTA Lovelace MD 07/19/2024, 1:59 PM

## 2024-07-19 NOTE — Progress Notes (Incomplete)
 Patient ID: [name], male   DOB: 09-14-1945, 78 y.o.   MRN: [id number] [facility] Cardiology    SUBJECTIVE: ***   Vitals:   07/19/24 0408 07/19/24 0500 07/19/24 0816 07/19/24 1223  BP: (!) 150/66  (!) 159/70 (!) 143/77  Pulse: 70  67 64  Resp: 20  (!) 22 18  Temp: 97.9 F (36.6 C)  97.8 F (36.6 C) 97.8 F (36.6 C)  TempSrc:      SpO2: 100%  97% 98%  Weight:  82.1 kg    Height:         Intake/Output Summary (Last 24 hours) at 07/19/2024 1358 Last data filed at 07/19/2024 1245 Gross per 24 hour  Intake 240 ml  Output 2050 ml  Net -1810 ml      PHYSICAL EXAM  General: Well developed, well nourished, in no acute distress HEENT:  Normocephalic and atramatic Neck:  No JVD.  Lungs: Clear bilaterally to auscultation and percussion. Heart: HRRR . Normal S1 and S2 without gallops or murmurs.  Abdomen: Bowel sounds are positive, abdomen soft and non-tender  Msk:  Back normal, normal gait. Normal strength and tone for age. Extremities: No clubbing, cyanosis or edema.   Neuro: Alert and oriented X 3. Psych:  Good affect, responds appropriately   LABS: Basic Metabolic Panel: Recent Labs    07/18/24 1422 07/19/24 0547  NA 140 141  K 4.6 4.3  CL 103 106  CO2 26 29  GLUCOSE 162* 162*  BUN 40* 41*  CREATININE 1.90* 1.93*  CALCIUM  10.6* 10.1   Liver Function Tests: No results for input(s): AST, ALT, ALKPHOS, BILITOT, PROT, ALBUMIN in the last 72 hours. No results for input(s): LIPASE, AMYLASE in the last 72 hours. CBC: Recent Labs    07/18/24 1422 07/19/24 0547  WBC 6.8 6.3  HGB 8.8* 7.2*  HCT 28.2* 22.8*  MCV 98.9 98.7  PLT 155 133*   Cardiac Enzymes: No results for input(s): CKTOTAL, CKMB, CKMBINDEX, TROPONINI in the last 72 hours. BNP: Invalid input(s): POCBNP D-Dimer: No results for input(s): DDIMER in the last 72 hours. Hemoglobin A1C: No results for input(s): HGBA1C in the last 72 hours. Fasting Lipid Panel: No  results for input(s): CHOL, HDL, LDLCALC, TRIG, CHOLHDL, LDLDIRECT in the last 72 hours. Thyroid  Function Tests: No results for input(s): TSH, T4TOTAL, T3FREE, THYROIDAB in the last 72 hours.  Invalid input(s): FREET3 Anemia Panel: No results for input(s): VITAMINB12, FOLATE, FERRITIN, TIBC, IRON , RETICCTPCT in the last 72 hours.  DG Chest 2 View Result Date: 07/18/2024 CLINICAL DATA:  Chest pain and shortness of breath EXAM: CHEST - 2 VIEW COMPARISON:  07/02/2024 FINDINGS: Frontal and lateral views of the chest demonstrate stable postsurgical changes from median sternotomy and aortic valve replacement. Cardiac silhouette is enlarged but stable. Persistent bibasilar veiling opacities consistent with consolidation and effusions, right greater than left. No pneumothorax. No acute bony abnormalities. IMPRESSION: 1. Stable bibasilar consolidation and effusions, right greater than left. 2. Stable enlarged cardiac silhouette. Electronically Signed   By: Ozell Daring M.D.   On: 07/18/2024 14:53     Echo ***  TELEMETRY: ***:  ASSESSMENT AND PLAN:  Principal Problem:   Acute on chronic heart failure with preserved ejection fraction (HFpEF) (HCC) Active Problems:   Essential hypertension   OSA (obstructive sleep apnea)   CAD (coronary artery disease), s/p CABG x 3   Paroxysmal atrial fibrillation (HCC)   Stage 3b chronic kidney disease (HCC)   Recurrent right pleural effusion  Anemia in chronic kidney disease   S/P TAVR (transcatheter aortic valve replacement)   Moderate to severe mitral and tricuspid regurgitation   Possible fluid overload due to blood transfusion (11/20. 11/22, 07/16/2024)    1. ***   Cara JONETTA Lovelace, MD, PHD FACC 07/19/2024 1:58 PM

## 2024-07-19 NOTE — Hospital Course (Signed)
 Hospital course / significant events:   HPI: 78 y.o. 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  He presented with 1 day of intermittent chest pain, shortness of breath and fatigue.  He denied cough, fever or chills or lower extremity pain.  He took a nitroglycerin  at home with some relief in chest pain.  Patient is followed by hematology and states he has been getting as needed blood transfusions for the past year and he usually feels much better after his transfusions but after the last 3 transfusions this past month he has felt more winded.  12/06: to ED.Tachypnea to 22, O2 sat initially 91% on room air, desatted to 82 while in the ED requiring 2 L to maintain sats in the high 90s.  Labs notable for troponin 75--69 and proBNP 13,000.  Hemoglobin 8.8, up from 7.9 about 4 days prior.  Creatinine 1.9 which is about his baseline for CKD 3B. EKG showed A-fib at 66 with RBBB. Chest x-ray showed stable bibasilar consolidation and effusions right greater than left and a stable cardiac silhouette. Treated with IV Lasix . Admission requested. Admitted to hospitalist service w/ HFpEF and associated acute hypoxic respiratory failure.  12/07.  Lasix  to 60 mg IV twice daily.  3.5 L O2 [place].  Hgb down to 7.2.  (+)recurrent pleural effusion R>L  12/08.  Hgb stable 7.2.  LDH high.  Will add on haptoglobin.  Tbili 1.1.  Case discussed with oncology to get CT scan of the chest abdomen pelvis without contrast.  Continue IV  Lasix . 1 unit PRBC.  R Thoracentesis removing 450 mL  12/09.  4L O2 [place].  CT scan of the chest abdomen pelvis shows progressive mediastinal adenopathy suggestive of chronic inflammatory or low-grade lymphoproliferative process.  Small to bilateral pleural effusions right greater than left.  Stable aortocaval adenopathy measuring 15 mm in short axis nonspecific, moderate stool burden 12/10: transition to po diuretics - torsemide  at increased dose of 40 mg daily.  12/11: Hgb below 8, unit PRBC today, otherwise doing well / baseline and pt comfortable w/ discharge, nothing further at this time from cardiology, follow closely outpatient      Consultants:  Cardiology  Medical Oncology  Procedures/Surgeries: 12/08: R thoracentesis       ASSESSMENT & PLAN:   Acute on chronic heart failure with preserved ejection fraction  Patient has a normal EF 60 to 65% but has severe mitral regurgitation and severe pulmonary hypertension.   torsemide  40 mg daily  Cardiology following outpatient  on Imdur , Jardiance , low-dose Coreg    Acute hypoxic respiratory failure  Patient on 4 L currently.  Had a pulse ox of 83% on room air.  Patient does not wear oxygen at home.   He is s/p R thoracentesis  O2 at home - ordered, see PT note for O2 saturation testing   Recurrent pleural effusion on right Thoracentesis drew off 450 mL underneath the right lung on 12/8 cytology and flow cytometry    Paroxysmal atrial fibrillation  Rate controlled Continue carvedilol  and aspirin  not on anticoagulation due to recurrent  GIB / anemia requiring transfusions   CAD (coronary artery disease), s/p CABG x 3 Essential HTN HLD Had chest pain that was resolved with nitroglycerin  Troponin slightly elevated but downtrending at 75-> 69 Continue aspirin , carvedilol , atorvastatin , Imdur , nitroglycerin  sublingual as needed chest pain   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  With LDH being high, CT scan of the chest, abdomen and pelvis showing progressive mediastinal adenopathy suggesting of chronic inflammatory or low-grade lymphoproliferative process.   Case discussed with Dr. Melanee oncology and she will set up for PET CT scan as outpatient.   Await cytology and flow cytometry from pleural fluid.   Other testing as per oncology - follow outpatient  PRBC transfusion as needed would recommend goal  Hgb over 8, HemOnc to formally follow    Lymphadenopathy High LDH and lymphadenopathy seen in the mediastinum and aortocaval area.  Case discussed with Dr. Melanee oncology and she will have to order a PET CT scan as outpatient.  Will await for flow cytometry and cytology report from pleural fluid. Follow w/ oncology    Stage 3b chronic kidney disease  High risk AKI/cardiorenal, esp on diuresis.   Creatinine 1.93 with a GFR of 35 Monitor BMP   OSA (obstructive sleep apnea) Patient is CPAP intolerant        overweight based on BMI: Body mass index is 25.42 kg/m.[name] Significantly low or high BMI is associated with higher medical risk.  Underweight - under 18  overweight - 25 to 29 obese - 30 or more Class 1 obesity: BMI of 30.0 to 34 Class 2 obesity: BMI of 35.0 to 39 Class 3 obesity: BMI of 40.0 to 49 Super Morbid Obesity: BMI 50-59 Super-super Morbid Obesity: BMI 60+ Healthy nutrition and physical activity advised as adjunct to other disease management and risk reduction treatments    DVT prophylaxis: holding rx ppx d/t anemia  IV fluids: no continuous IV fluids  Nutrition: cardaic Central lines / other devices: none  Code Status: FULL CODE ACP documentation reviewed:  none on file in [facility]  TOC needs: O2 at home  Medical barriers to dispo: transition po diuretics. Expected medical readiness for discharge 1-2 days / cardiology clearance .

## 2024-07-19 NOTE — Assessment & Plan Note (Signed)
 Patient on 4 L currently.  Had a pulse ox of 83% on room air.  Patient does not wear oxygen at home.

## 2024-07-19 NOTE — Plan of Care (Signed)

## 2024-07-20 ENCOUNTER — Inpatient Hospital Stay

## 2024-07-20 ENCOUNTER — Telehealth: Payer: Self-pay | Admitting: Oncology

## 2024-07-20 LAB — BODY FLUID CELL COUNT WITH DIFFERENTIAL
Eos, Fluid: 4 %
Lymphs, Fluid: 75 %
Monocyte-Macrophage-Serous Fluid: 17 %
Neutrophil Count, Fluid: 4 %
Total Nucleated Cell Count, Fluid: 60 uL

## 2024-07-20 LAB — CBC
HCT: 23.5 % — ABNORMAL LOW (ref 39.0–52.0)
Hemoglobin: 7.2 g/dL — ABNORMAL LOW (ref 13.0–17.0)
MCH: 31 pg (ref 26.0–34.0)
MCHC: 30.6 g/dL (ref 30.0–36.0)
MCV: 101.3 fL — ABNORMAL HIGH (ref 80.0–100.0)
Platelets: 125 K/uL — ABNORMAL LOW (ref 150–400)
RBC: 2.32 MIL/uL — ABNORMAL LOW (ref 4.22–5.81)
RDW: 18.6 % — ABNORMAL HIGH (ref 11.5–15.5)
WBC: 5.4 K/uL (ref 4.0–10.5)
nRBC: 0 % (ref 0.0–0.2)

## 2024-07-20 LAB — BASIC METABOLIC PANEL WITH GFR
Anion gap: 8 (ref 5–15)
BUN: 41 mg/dL — ABNORMAL HIGH (ref 8–23)
CO2: 30 mmol/L (ref 22–32)
Calcium: 10.1 mg/dL (ref 8.9–10.3)
Chloride: 103 mmol/L (ref 98–111)
Creatinine, Ser: 1.89 mg/dL — ABNORMAL HIGH (ref 0.61–1.24)
GFR, Estimated: 36 mL/min — ABNORMAL LOW (ref >60)
Glucose, Bld: 144 mg/dL — ABNORMAL HIGH (ref 70–99)
Potassium: 4.1 mmol/L (ref 3.5–5.1)
Sodium: 140 mmol/L (ref 135–145)

## 2024-07-20 LAB — HEPATIC FUNCTION PANEL
ALT: 10 U/L (ref 0–44)
AST: 30 U/L (ref 15–41)
Albumin: 3.6 g/dL (ref 3.5–5.0)
Alkaline Phosphatase: 110 U/L (ref 38–126)
Bilirubin, Direct: 0.6 mg/dL — ABNORMAL HIGH (ref 0.0–0.2)
Indirect Bilirubin: 0.5 mg/dL (ref 0.3–0.9)
Total Bilirubin: 1.1 mg/dL (ref 0.0–1.2)
Total Protein: 7 g/dL (ref 6.5–8.1)

## 2024-07-20 LAB — LACTATE DEHYDROGENASE: LDH: 883 U/L — ABNORMAL HIGH (ref 105–235)

## 2024-07-20 LAB — PROTEIN, PLEURAL OR PERITONEAL FLUID: Total protein, fluid: <3 g/dL

## 2024-07-20 LAB — LACTATE DEHYDROGENASE, PLEURAL OR PERITONEAL FLUID: LD, Fluid: 278 U/L — ABNORMAL HIGH (ref 3–23)

## 2024-07-20 LAB — PREPARE RBC (CROSSMATCH)

## 2024-07-20 MED ORDER — ACETAMINOPHEN 325 MG PO TABS
650.0000 mg | ORAL_TABLET | Freq: Once | ORAL | Status: AC
  Administered 2024-07-20: 650 mg via ORAL
  Filled 2024-07-20: qty 2

## 2024-07-20 MED ORDER — IOHEXOL 9 MG/ML PO SOLN
500.0000 mL | ORAL | Status: AC
  Administered 2024-07-20 (×2): 500 mL via ORAL

## 2024-07-20 MED ORDER — LIDOCAINE HCL (PF) 1 % IJ SOLN
10.0000 mL | Freq: Once | INTRAMUSCULAR | Status: AC
  Administered 2024-07-20: 10 mL

## 2024-07-20 MED ORDER — SODIUM CHLORIDE 0.9% IV SOLUTION: Freq: Once | INTRAVENOUS | Status: AC

## 2024-07-20 NOTE — Procedures (Signed)
 PROCEDURE SUMMARY:  Successful US  guided right-sided thoracentesis. Yielded of amber fluid. Patient tolerated procedure well. No immediate complications. EBL = trace  Specimen sent for labs.  Post procedure chest X-ray reveals no pneumothorax  Alwyn Cordner CHRISTELLA Bal PA-C 07/20/2024 11:10 AM

## 2024-07-20 NOTE — Progress Notes (Signed)
 Progress Note   Patient: [name] [name] DOB: Jan 09, 1946 DOA: 07/18/2024     1 DOS: the patient was seen and examined on 07/20/2024   Brief hospital course: 78 y.o. 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 He presented with 1 day of intermittent chest pain, shortness of breath and fatigue.  He denied cough, fever or chills or lower extremity pain.  He took a nitroglycerin  at home with some relief in chest pain.  Patient is followed by hematology and states he has been getting as needed blood transfusions for the past year and he usually feels much better after his transfusions but after the last 3 transfusions this past month he has felt more winded. In the ED mild tachypnea to 22, O2 sat initially 91% on room air.  He desatted to 82 while in the ED requiring 2 L to maintain sats in the high 90s.   Labs notable for troponin 75--69 and proBNP 13,000.  Hemoglobin 8.8, up from 7.9 about 4 days ago.  Creatinine 1.9 which is about his baseline for CKD 3B.EKG showed A-fib at 66 with RBBB Chest x-ray showed stable bibasilar consolidation and effusions right greater than left and a stable cardiac silhouette. While in the ED he desaturated to 82% and was placed on O2.  Treated with IV Lasix . Admission requested.   12/7.  Increase Lasix  to 60 mg IV twice daily.  Patient is on 3.5 L of oxygen and normally does not wear oxygen.  Hemoglobin down to 7.2.  Patient has recurrent pleural effusion  right greater than left. 12/8.  Hemoglobin stable at 7.2.  LDH high.  Will add on haptoglobin.  Total bilirubin 1.1.  Case discussed with oncology to get CT scan of the chest abdomen pelvis without contrast.  Continue IV Lasix .  Transfuse 1 unit of packed red blood cells.  Thoracentesis removing 450 mL underneath the right lung.  Assessment and Plan: * Acute on chronic heart failure with preserved ejection fraction (HCC) Patient has a normal EF 60 to 65% but has severe mitral regurgitation and pulmonary hypertension.  Continue Lasix  to 60 mg IV twice daily.  Cardiology consultation.  Patient on Imdur , Jardiance , low-dose Coreg   Acute hypoxic respiratory failure (HCC) Patient on 4 L currently.  Had a pulse ox of 83% on room air.  Patient does not wear oxygen at home.  Recurrent pleural effusion on right Ultrasound thoracentesis with labs, cytology and flow cytometry.  Paroxysmal atrial fibrillation (HCC) Rate controlled Continue carvedilol  and aspirin --not on anticoagulation, likely due to recurrent anemia requiring transfusions  CAD (coronary artery disease), s/p CABG x 3 Had chest pain that was resolved with nitroglycerin  Troponin slightly elevated but downtrending at 75-> 69 Continue aspirin , carvedilol , atorvastatin , Imdur , nitroglycerin  sublingual as needed chest pain  Anemia in chronic kidney disease Hemoglobin 7.2, will transfuse 1 unit of packed red blood cells.  Case discussed with Dr. Melanee oncology following.  With LDH being high she wanted to get a CT scan of the chest abdomen pelvis.  Total bilirubin normal range.  I added  on a haptoglobin.  Other testing as per oncology  Stage 3b chronic kidney disease (HCC) Watch closely with diuresis.  Creatinine 1.93 with a GFR of 35  OSA (obstructive sleep apnea) Patient is CPAP intolerant  Essential hypertension Continue home meds        Subjective: Patient feeling little bit better since coming in.  Hemoglobin 7.2.  Admitted  with shortness of breath and CHF exacerbation.  Thoracentesis and blood transfusion today  Physical Exam: Vitals:   07/20/24 0900 07/20/24 1017 07/20/24 1050 07/20/24 1300  BP: (!) 130/50 (!) 150/56 (!) 142/60 (!) 125/46  Pulse:  62 63 62  Resp:  18 18 18   Temp:  97.9 F (36.6 C) 98 F (36.7 C) 97.9 F (36.6 C)  TempSrc:  Oral Oral Oral  SpO2: 100% 98% 100% 99%  Weight:      Height:       Physical Exam HENT:     Head: Normocephalic.     Mouth/Throat:     Pharynx: No oropharyngeal exudate.  Eyes:     General: Lids are normal.     Conjunctiva/sclera: Conjunctivae normal.  Cardiovascular:     Rate and Rhythm: Normal rate and regular rhythm.     Heart sounds: Normal heart sounds, S1 normal and S2 normal.  Pulmonary:     Breath sounds: Examination of the right-middle field reveals decreased breath sounds and rhonchi. Examination of the right-lower field reveals decreased breath sounds and rhonchi. Examination of the left-lower field reveals decreased breath sounds and rhonchi. Decreased breath sounds and rhonchi present. No wheezing or rales.  Abdominal:     Palpations: Abdomen is soft.     Tenderness: There is no abdominal tenderness.  Musculoskeletal:     Right lower leg: No swelling.     Left lower leg: No swelling.  Skin:    General: Skin is warm.     Findings: No rash.  Neurological:     Mental Status: He is alert and oriented to person, place, and time.     Data Reviewed: Creatinine 1.89, electrolytes normal range, LDH 883.  Hemoglobin 7.2, white blood count 5.4, platelet count 125, liver function test showed also a total bilirubin of 1.1.  Family Communication: Updated wife on the phone  Disposition: Status is: Inpatient Remains inpatient appropriate because: Oncology wanted to get a CT scan of the chest abdomen pelvis to rule out cancerous process.  Pleural fluid sent for cytology and flow cytometry.  Planned Discharge Destination: Home with Home  Health    Time spent: 28 minutes  Author: Charlie Patterson, MD 07/20/2024 3:49 PM  For on call review [email or web address].

## 2024-07-20 NOTE — Plan of Care (Signed)
  Problem: Clinical Measurements: Goal: Ability to maintain clinical measurements within normal limits will improve Outcome: Progressing   Problem: Elimination: Goal: Will not experience complications related to bowel motility Outcome: Progressing   Problem: Cardiac: Goal: Ability to achieve and maintain adequate cardiopulmonary perfusion will improve Outcome: Progressing

## 2024-07-20 NOTE — Telephone Encounter (Signed)
 Pt called and canceled lab tomorrow and blood Wednesday due to being in the hospital. Appts canceled and noted.

## 2024-07-20 NOTE — Plan of Care (Signed)

## 2024-07-20 NOTE — TOC Initial Note (Addendum)
 Transition of Care [facility]) - Initial/Assessment Note    Patient Details  Name: [name] MRN: [id number] Date of Birth: [birth year]  [facility]) CM/SW Contact:    Jerry DELENA Daring, RN Phone Number: 07/20/2024, 12:31 PM  Clinical Narrative:        Readmission Risk assessment complete.          RNCM assessed patient. Patient alone in room. Presented as alert and able to answer questions independently. Patient has been a long time patient of Dr. Sadie of [facility] for primary care. He lives with his wife and his children lives close by. Lives independently. [place] he does not believe he will need home health services when discharged. [facility] [place] [facility] for [facility]. Denies difficulty affording medications. Stated his son or daughter-in-law will provide transportation at discharge.   TOC will follow for discharge needs.    Barriers to Discharge: Continued Medical Work up   Patient Goals and CMS Choice            Expected Discharge Plan and Services                                              Prior Living Arrangements/Services                       Activities of Daily Living   ADL Screening (condition at time of admission) Independently performs ADLs?: Yes (appropriate for developmental age) Is the patient deaf or have difficulty hearing?: No Does the patient have difficulty seeing, even when wearing glasses/contacts?: No Does the patient have difficulty concentrating, remembering, or making decisions?: No  Permission Sought/Granted                  Emotional Assessment              Admission diagnosis:  Acute on chronic heart failure with preserved ejection fraction (HFpEF) (HCC) [I50.33] Acute on chronic heart failure with preserved ejection fraction (HCC) [I50.33] Patient Active Problem List   Diagnosis Date Noted   Acute on chronic heart failure with preserved ejection fraction (HCC) 07/19/2024    Possible fluid overload due to blood transfusion (11/20. 11/22, 07/16/2024) 07/18/2024   Anemia in chronic kidney disease 04/14/2024   Other specified anemias 03/27/2024   Chest pain 03/02/2024   Diabetes mellitus type 2, noninsulin dependent (HCC) 03/02/2024   CKD (chronic kidney disease), stage IV (HCC) 03/02/2024   Respiratory arrest (HCC) 03/02/2024   Unresponsiveness 03/02/2024   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/2024   IDA (iron  deficiency anemia) 12/16/2023   Left ventricular hypertrophy 12/16/2023   Thrombocytopenia 12/16/2023   Stage 4 chronic kidney disease (HCC) 12/16/2023   Anemia of chronic disease 12/16/2023   Pleural effusion, bilateral 12/14/2023   Chronic diastolic CHF (congestive heart failure) (HCC) 11/12/2023   Murmur, cardiac 11/12/2023   Polyp of transverse colon 10/19/2023   Gastric polyps 10/19/2023   Acute exacerbation of CHF (congestive heart failure) (HCC) 10/16/2023   Severe anemia 10/16/2023   Dyspnea on exertion 10/16/2023   Personal history of other malignant neoplasm of skin 10/16/2023   Moderate to severe mitral and tricuspid regurgitation 09/15/2023   Melena 08/26/2023   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   CHF exacerbation (HCC) 08/22/2023   Upper GI bleed 08/22/2023  Recurrent pleural effusion on right 08/22/2023   Symptomatic anemia 08/22/2023   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/01/2023   Status post Mohs surgery 05/01/2023   Right inguinal hernia 02/19/2023   Umbilical hernia without obstruction and without gangrene 02/19/2023   Hyponatremia 12/26/2022   Acute renal failure superimposed on stage 3b chronic kidney disease (HCC) 12/26/2022   Severe pulmonary hypertension (HCC) 12/26/2022   Vitamin D  deficiency 12/26/2022   Acute hypoxic respiratory failure (HCC) 12/26/2022   Severe mitral regurgitation 12/26/2022   Severe tricuspid regurgitation 12/26/2022   Acute heart failure with preserved  ejection fraction (HFpEF) (HCC) 12/20/2022   Chronic venous stasis 12/20/2022   Paroxysmal atrial fibrillation (HCC) 12/20/2022   Iron  deficiency anemia 05/17/2021   Thyroid  nodule 11/04/2020   Lymphedema 10/27/2020   Gastric mass    Polyp of sigmoid colon    Acute blood loss anemia 09/04/2020   COVID-19 virus infection    Hypercalcemia    [place]    Acute on chronic blood loss anemia 09/02/2020   Hyperlipidemia associated with type 2 diabetes mellitus (HCC) 09/02/2020   S/P drug eluting coronary stent placement 06/09/2020   Other hyperlipidemia 08/18/2019   T2DM (type 2 diabetes mellitus) (HCC) 01/14/2017   Essential hypertension 01/14/2017   OSA (obstructive sleep apnea) 01/14/2017   At risk for fluid volume overload 09/08/2016   BBB (bundle branch block) 08/30/2016   Obesity with body mass index (BMI) of 30.0 to 39.9 08/30/2016   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   1st degree AV block 08/30/2016   CAD (coronary artery disease), s/p CABG x 3 08/20/2016   History of colonic polyps 11/05/2013   PCP:  Jerry Manna, MD Pharmacy:   [phone] [place] [place] - [address] [place] [address] Phone: [phone] Fax: [phone]  [facility] [address] [place] [address] Phone: [phone] Fax: [phone]     Social Drivers of Health (SDOH) Social History: SDOH Screenings   Food Insecurity: No Food Insecurity (07/18/2024)  Housing: Low Risk  (07/18/2024)  Transportation Needs: No Transportation Needs (07/18/2024)  Utilities: Not At Risk (07/18/2024)  Depression (PHQ2-9): Low Risk  (07/16/2024)  Financial Resource Strain: Low Risk  (05/08/2024)   Received from [facility]  Social Connections: Moderately Isolated (07/18/2024)  Tobacco Use: Medium Risk (07/18/2024)   SDOH Interventions:     Readmission Risk Interventions    07/20/2024   12:29 PM  Readmission Risk Prevention Plan   Transportation Screening Complete  PCP or Specialist Appt within 3-5 Days --  HRI or Home Care Consult Complete  Social Work Consult for Recovery Care Planning/Counseling Complete  Medication Review Oceanographer) Complete

## 2024-07-20 NOTE — Progress Notes (Signed)
 Heart Failure Navigator Progress Note  Assessed for Heart & Vascular TOC clinic readiness.  Patient does not meet criteria due to current [facility] Cardiology patient.   Navigator will sign off at this time.  Roxy Horseman, RN, BSN [facility] Heart Failure Navigator Secure Chat Only

## 2024-07-20 NOTE — Progress Notes (Signed)
 [facility] CLINIC CARDIOLOGY PROGRESS NOTE   Patient ID: [name] MRN: [id number] DOB/AGE: 76-May-1947 76 y.o.  Admit date: 07/18/2024 Referring Physician Dr. Delayne Solian  Primary Physician [name], MD  Primary Cardiologist Dr. Ammon Reason for Consultation AoCHF  HPI: [name] is a 78 y.o. male with a past medical history of chronic HFpEF, persistent atrial fibrillation, aortic stenosis s/p TAVR (01/2024), coronary artery disease s/p CABG x3, multiple coronary stents, most recent stent to ostial SVG in-stent restenosis (2021), hypertension, hyperlipidemia, OSA, CKD, diabetes who presented to the ED on 07/18/2024 for SOB. Cardiology was consulted for further evaluation.   Interval History:  -Patient seen and examined this AM, remains SOB.  -Reports good UOP with diuresis. LE edema still persists.  -Cr stable on AM labs, Hgb 7.2.  Review of systems complete and found to be negative unless listed above   Vitals:   07/19/24 2118 07/20/24 0049 07/20/24 0500 07/20/24 0514  BP: (!) 139/54 (!) 147/64  (!) 146/54  Pulse: 60 62  (!) 58  Resp: 20 20  20   Temp: 97.9 F (36.6 C) 97.8 F (36.6 C)  98.1 F (36.7 C)  TempSrc:      SpO2: 98% 97%  100%  Weight:   88 kg   Height:         Intake/Output Summary (Last 24 hours) at 07/20/2024 0830 Last data filed at 07/20/2024 0500 Gross per 24 hour  Intake 720 ml  Output 2650 ml  Net -1930 ml     PHYSICAL EXAM General: Chronically ill appearing male, well nourished, in no acute distress. HEENT: Normocephalic and atraumatic. Neck: No JVD.  Lungs: Normal respiratory effort on 4L [place]. Clear bilaterally to auscultation. No wheezes, crackles, rhonchi.  Heart: Irregularly irregular. Normal S1 and S2 without gallops or murmurs. Radial & DP pulses 2+ bilaterally. Abdomen: Non-distended appearing.  Msk: Normal strength and tone for age. Extremities: No clubbing, cyanosis or edema.   Neuro: Alert and oriented X 3. Psych:  Mood appropriate, affect congruent.    LABS: Basic Metabolic Panel: Recent Labs    07/19/24 0547 07/20/24 0425  NA 141 140  K 4.3 4.1  CL 106 103  CO2 29 30  GLUCOSE 162* 144*  BUN 41* 41*  CREATININE 1.93* 1.89*  CALCIUM  10.1 10.1   Liver Function Tests: No results for input(s): AST, ALT, ALKPHOS, BILITOT, PROT, ALBUMIN in the last 72 hours. No results for input(s): LIPASE, AMYLASE in the last 72 hours. CBC: Recent Labs    07/19/24 0547 07/20/24 0425  WBC 6.3 5.4  HGB 7.2* 7.2*  HCT 22.8* 23.5*  MCV 98.7 101.3*  PLT 133* 125*   Cardiac Enzymes: No results for input(s): CKTOTAL, CKMB, CKMBINDEX, TROPONINIHS in the last 72 hours. BNP: No results for input(s): BNP in the last 72 hours. D-Dimer: No results for input(s): DDIMER in the last 72 hours. Hemoglobin A1C: No results for input(s): HGBA1C in the last 72 hours. Fasting Lipid Panel: No results for input(s): CHOL, HDL, LDLCALC, TRIG, CHOLHDL, LDLDIRECT in the last 72 hours. Thyroid  Function Tests: No results for input(s): TSH, T4TOTAL, T3FREE, THYROIDAB in the last 72 hours.  Invalid input(s): FREET3 Anemia Panel: No results for input(s): VITAMINB12, FOLATE, FERRITIN, TIBC, IRON , RETICCTPCT in the last 72 hours.  DG Chest 2 View Result Date: 07/18/2024 CLINICAL DATA:  Chest pain and shortness of breath EXAM: CHEST - 2 VIEW COMPARISON:  07/02/2024 FINDINGS: Frontal and lateral views of the chest demonstrate stable postsurgical changes  from median sternotomy and aortic valve replacement. Cardiac silhouette is enlarged but stable. Persistent bibasilar veiling opacities consistent with consolidation and effusions, right greater than left. No pneumothorax. No acute bony abnormalities. IMPRESSION: 1. Stable bibasilar consolidation and effusions, right greater than left. 2. Stable enlarged cardiac silhouette. Electronically Signed   By: Ozell Daring M.D.    On: 07/18/2024 14:53     ECHO 02/2024: 1. Left ventricular ejection fraction, by estimation, is 60 to 65%. The left ventricle has normal function. The left ventricle has no regional wall motion abnormalities. Left ventricular diastolic parameters were normal.   2. Right ventricular systolic function is normal. The right ventricular size is normal.   3. The mitral valve is normal in structure. Moderate to severe mitral  valve regurgitation. No evidence of mitral stenosis.   4. Tricuspid valve regurgitation is mild to moderate.   5. The aortic valve is normal in structure. Aortic valve regurgitation is  mild. No aortic stenosis is present.   6. The inferior vena cava is normal in size with greater than 50%  respiratory variability, suggesting right atrial pressure of 3 mmHg.   [facility] 03/2024: NORMAL LEFT VENTRICULAR SYSTOLIC FUNCTION WITH SEVERE LVH  ESTIMATED EF: >55%, CALC EF(3D): 55%  ELEVATED LA PRESSURES WITH DIASTOLIC DYSFUNCTION (GRADE 3)  NORMAL RIGHT VENTRICULAR SYSTOLIC FUNCTION  VALVULAR REGURGITATION: MILD AR, MILD MR, TRIVIAL PR, MILD TR  ESTIMATED RVSP: 85 mmHg (ABNORMAL)  VALVULAR STENOSIS: TRANSCATHETER BIOPROSTHETIC, No MS, No PS, No TS    TELEMETRY (personally reviewed): atrial fibrillation rate 60s  EKG (personally reviewed): atrial fibrillation rate 66 bpm  DATA reviewed by me 07/20/24: last 24h vitals tele labs imaging I/O, hospitalist progress note  Principal Problem:   Acute on chronic heart failure with preserved ejection fraction (HCC) Active Problems:   Essential hypertension   OSA (obstructive sleep apnea)   CAD (coronary artery disease), s/p CABG x 3   Paroxysmal atrial fibrillation (HCC)   Acute hypoxic respiratory failure (HCC)   Severe mitral regurgitation   Stage 3b chronic kidney disease (HCC)   Recurrent pleural effusion on right   Anemia in chronic kidney disease   S/P TAVR (transcatheter aortic valve replacement)   Moderate to severe mitral and  tricuspid regurgitation   Possible fluid overload due to blood transfusion (11/20. 11/22, 07/16/2024)    ASSESSMENT AND PLAN: [name] is a 78 y.o. male with a past medical history of chronic HFpEF, persistent atrial fibrillation, aortic stenosis s/p TAVR (01/2024), coronary artery disease s/p CABG x3, multiple coronary stents, most recent stent to ostial SVG in-stent restenosis (2021), hypertension, hyperlipidemia, OSA, CKD, diabetes who presented to the ED on 07/18/2024 for SOB. Cardiology was consulted for further evaluation.   # Acute hypoxic respiratory failure # Acute on chronic HFpEF # Bilateral pleural effusions # Coronary artery disease s/p CABG # Demand ischemia # Aortic stenosis s/p TAVR 01/2024 Patient presented to ED for evaluation of SOB.  BNP elevated at 13,000.  Trops mild and flat.  Chest x-ray with bilateral pleural effusions. - Continue IV Lasix  60 mg twice daily.   - Plavix  held given anemia. Continue home atorvastatin  80 mg daily, aspirin  81 mg daily. - Home amlodipine  held. Continue home Imdur  120 mg daily, carvedilol  3.125 mg twice daily, jardiance  10 mg daily.  -Mild troponin elevation most consistent with demand/supply mismatch and not ACS.  - Patient with history of recurrent pleural effusions, will likely require thoracentesis during admission.  This patient's case was discussed and  created with Dr. Ammon and he is in agreement.  Signed:  Danita Bloch, PA-C  07/20/2024, 8:30 AM [facility]

## 2024-07-21 ENCOUNTER — Inpatient Hospital Stay

## 2024-07-21 DIAGNOSIS — R591 Generalized enlarged lymph nodes: Secondary | ICD-10-CM | POA: Insufficient documentation

## 2024-07-21 LAB — CBC
HCT: 26.9 % — ABNORMAL LOW (ref 39.0–52.0)
Hemoglobin: 8.4 g/dL — ABNORMAL LOW (ref 13.0–17.0)
MCH: 31.5 pg (ref 26.0–34.0)
MCHC: 31.2 g/dL (ref 30.0–36.0)
MCV: 100.7 fL — ABNORMAL HIGH (ref 80.0–100.0)
Platelets: 127 K/uL — ABNORMAL LOW (ref 150–400)
RBC: 2.67 MIL/uL — ABNORMAL LOW (ref 4.22–5.81)
RDW: 18.6 % — ABNORMAL HIGH (ref 11.5–15.5)
WBC: 5.4 K/uL (ref 4.0–10.5)
nRBC: 0 % (ref 0.0–0.2)

## 2024-07-21 LAB — BASIC METABOLIC PANEL WITH GFR
Anion gap: 9 (ref 5–15)
BUN: 40 mg/dL — ABNORMAL HIGH (ref 8–23)
CO2: 31 mmol/L (ref 22–32)
Calcium: 9.5 mg/dL (ref 8.9–10.3)
Chloride: 99 mmol/L (ref 98–111)
Creatinine, Ser: 1.89 mg/dL — ABNORMAL HIGH (ref 0.61–1.24)
GFR, Estimated: 36 mL/min — ABNORMAL LOW (ref >60)
Glucose, Bld: 154 mg/dL — ABNORMAL HIGH (ref 70–99)
Potassium: 4.2 mmol/L (ref 3.5–5.1)
Sodium: 138 mmol/L (ref 135–145)

## 2024-07-21 LAB — HAPTOGLOBIN: Haptoglobin: <10 mg/dL — ABNORMAL LOW (ref 34–355)

## 2024-07-21 LAB — CYTOLOGY - NON PAP

## 2024-07-21 NOTE — Progress Notes (Signed)
 Progress Note   Patient: [name] [name] DOB: May 18, 1946 DOA: 07/18/2024     2 DOS: the patient was seen and examined on 07/21/2024   Brief hospital course: 78 y.o. 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 He presented with 1 day of intermittent chest pain, shortness of breath and fatigue.  He denied cough, fever or chills or lower extremity pain.  He took a nitroglycerin  at home with some relief in chest pain.  Patient is followed by hematology and states he has been getting as needed blood transfusions for the past year and he usually feels much better after his transfusions but after the last 3 transfusions this past month he has felt more winded. In the ED mild tachypnea to 22, O2 sat initially 91% on room air.  He desatted to 82 while in the ED requiring 2 L to maintain sats in the high 90s.   Labs notable for troponin 75--69 and proBNP 13,000.  Hemoglobin 8.8, up from 7.9 about 4 days ago.  Creatinine 1.9 which is about his baseline for CKD 3B.EKG showed A-fib at 66 with RBBB Chest x-ray showed stable bibasilar consolidation and effusions right greater than left and a stable cardiac silhouette. While in the ED he desaturated to 82% and was placed on O2.  Treated with IV Lasix . Admission requested.   12/7.  Increase Lasix  to 60 mg IV twice daily.  Patient is on 3.5 L of oxygen and normally does not wear oxygen.  Hemoglobin down to 7.2.  Patient has recurrent pleural effusion  right greater than left. 12/8.  Hemoglobin stable at 7.2.  LDH high.  Will add on haptoglobin.  Total bilirubin 1.1.  Case discussed with oncology to get CT scan of the chest abdomen pelvis without contrast.  Continue IV Lasix .  Transfuse 1 unit of packed red blood cells.  Thoracentesis removing 450 mL underneath the right lung. 12/9.  Patient on 4 L oxygen this morning.  I tried to dial down to 2 L.  CT scan of the chest abdomen pelvis shows progressive mediastinal adenopathy suggestive of chronic inflammatory or low-grade lymphoproliferative process.  Small to bilateral pleural effusions right greater than left.  Stable aortocaval adenopathy measuring 15 mm in short axis nonspecific, moderate stool burden  Assessment and Plan: * Acute on chronic heart failure with preserved ejection fraction (HCC) Patient has a normal EF 60 to 65% but has severe mitral regurgitation and pulmonary hypertension.  Continue Lasix  to 60 mg IV twice daily.  Cardiology following.  Patient on Imdur , Jardiance , low-dose Coreg   Acute hypoxic respiratory failure (HCC) Patient on 4 L currently.  Had a pulse ox of 83% on room air.  Patient does not wear oxygen at home.  Try to dial down to 2 L today.  Recurrent pleural effusion on right Thoracentesis drew off 450 mL underneath the right lung on 12/8, cytology and flow cytometry ordered  Paroxysmal atrial fibrillation (HCC) Rate controlled Continue carvedilol  and aspirin --not on anticoagulation, likely due to recurrent anemia requiring transfusions  CAD (coronary artery disease), s/p CABG  x 3 Had chest pain that was resolved with nitroglycerin  Troponin slightly elevated but downtrending at 75-> 69 Continue aspirin , carvedilol , atorvastatin , Imdur , nitroglycerin  sublingual as needed chest pain  Anemia in chronic kidney disease Hemoglobin up to 8.4 after 1 unit of packed red blood cells.  With LDH being high, CT scan of the chest, abdomen and pelvis showing progressive  mediastinal adenopathy suggesting of chronic inflammatory or low-grade lymphoproliferative process.  Case discussed with Dr. Melanee oncology and she will set up for PET CT scan as outpatient.  Await cytology and flow cytometry from pleural fluid.  Total bilirubin normal range.  I added on a haptoglobin.  Other testing as per oncology  Lymphadenopathy High LDH and lymphadenopathy seen in the mediastinum and aortocaval area.  Case discussed with Dr. Melanee oncology and she will have to order a PET CT scan as outpatient.  Will await for flow cytometry and cytology report from pleural fluid.  Stage 3b chronic kidney disease (HCC) Watch closely with diuresis.  Creatinine 1.93 with a GFR of 35  OSA (obstructive sleep apnea) Patient is CPAP intolerant  Essential hypertension Continue home meds        Subjective: Patient feeling little bit better than when he came in.  Hemoglobin up to 8.4.  He states he feels better when his hemoglobin is a little higher.  On IV Lasix  for CHF exacerbation.  Physical Exam: Vitals:   07/21/24 0419 07/21/24 0500 07/21/24 1159 07/21/24 1515  BP: (!) 147/62  (!) 130/54 (!) 140/55  Pulse: 60  (!) 55 62  Resp: 19  16 16   Temp: (!) 97.4 F (36.3 C)  97.7 F (36.5 C) 97.6 F (36.4 C)  TempSrc: Oral     SpO2: 100%  97% 99%  Weight:  86.6 kg    Height:       Physical Exam HENT:     Head: Normocephalic.     Mouth/Throat:     Pharynx: No oropharyngeal exudate.  Eyes:     General: Lids are normal.     Conjunctiva/sclera: Conjunctivae normal.  Cardiovascular:     Rate and Rhythm: Normal rate and regular rhythm.     Heart sounds: Normal heart sounds, S1 normal and S2 normal.  Pulmonary:     Breath sounds: Examination of the right-lower field reveals decreased breath sounds and rhonchi. Examination of the left-lower field reveals decreased breath sounds and rhonchi. Decreased breath sounds and rhonchi present. No wheezing or rales.  Abdominal:     Palpations:  Abdomen is soft.     Tenderness: There is no abdominal tenderness.  Musculoskeletal:     Right lower leg: No swelling.     Left lower leg: No swelling.  Skin:    General: Skin is warm.     Findings: No rash.  Neurological:     Mental Status: He is alert and oriented to person, place, and time.     Data Reviewed: Creatinine 1.89, electrolytes normal range, GFR 36, white blood cell count 5.4, hemoglobin 8.4, platelet count 127  Family Communication: Spoke with wife on the phone  Disposition: Status is: Inpatient Remains inpatient appropriate because: Continue diuresis with IV Lasix  twice daily.  Try to see if we can get off oxygen.  Planned Discharge Destination: Home with Home Health    Time spent: 28 minutes  Author: Charlie Patterson, MD 07/21/2024 3:27 PM  For on call review [email or web address].

## 2024-07-21 NOTE — Assessment & Plan Note (Signed)
 High LDH and lymphadenopathy seen in the mediastinum and aortocaval area.  Case discussed with Dr. Melanee oncology and she will have to order a PET CT scan as outpatient.  Will await for flow cytometry and cytology report from pleural fluid.

## 2024-07-21 NOTE — Consult Note (Signed)
 Hematology/Oncology Consult note [facility] Telephone:([phone] Fax:([phone]  Patient Care Team: [name], MD as PCP - General (Internal Medicine) Sheldon Standing, MD as Consulting Physician (General Surgery) Janalyn Keene NOVAK, MD (Inactive) as Consulting Physician (Gastroenterology) Melanee Annah BROCKS, MD as Consulting Physician (Oncology)   Name of the patient: [name]  [id number]  1946-04-02    Reason for referral- ***   Referring physician- ***  Date of visit: @TODAY @   History of presenting illness- ***  ECOG PS- ***  Pain scale- ***   Review of systems- ROS  No Known Allergies  Patient Active Problem List   Diagnosis Date Noted   Lymphadenopathy 07/21/2024   Acute on chronic heart failure with preserved ejection fraction (HCC) 07/19/2024   Possible fluid overload due to blood transfusion (11/20. 11/22, 07/16/2024) 07/18/2024   Anemia in chronic kidney disease 04/14/2024   Other specified anemias 03/27/2024   Chest pain 03/02/2024   Diabetes mellitus type 2, noninsulin dependent (HCC) 03/02/2024   CKD (chronic kidney disease), stage IV (HCC) 03/02/2024   Respiratory arrest (HCC) 03/02/2024   Unresponsiveness 03/02/2024   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/2024   IDA (iron  deficiency anemia) 12/16/2023   Left ventricular hypertrophy 12/16/2023   Thrombocytopenia 12/16/2023   Stage 4 chronic kidney disease (HCC) 12/16/2023   Anemia of chronic disease 12/16/2023   Pleural effusion, bilateral 12/14/2023   Chronic diastolic CHF (congestive heart failure) (HCC) 11/12/2023   Murmur, cardiac 11/12/2023   Polyp of transverse colon 10/19/2023   Gastric polyps 10/19/2023   Acute exacerbation of CHF (congestive heart failure) (HCC) 10/16/2023   Severe anemia 10/16/2023   Dyspnea on exertion 10/16/2023   Personal history of other malignant neoplasm of skin 10/16/2023   Moderate to severe mitral and  tricuspid regurgitation 09/15/2023   Melena 08/26/2023   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   CHF exacerbation (HCC) 08/22/2023   Upper GI bleed 08/22/2023   Recurrent pleural effusion on right 08/22/2023   Symptomatic anemia 08/22/2023   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/01/2023   Status post Mohs surgery 05/01/2023   Right inguinal hernia 02/19/2023   Umbilical hernia without obstruction and without gangrene 02/19/2023   Hyponatremia 12/26/2022   Acute renal failure superimposed on stage 3b chronic kidney disease (HCC) 12/26/2022   Severe pulmonary hypertension (HCC) 12/26/2022   Vitamin D  deficiency 12/26/2022   Acute hypoxic respiratory failure (HCC) 12/26/2022   Severe mitral regurgitation 12/26/2022   Severe tricuspid regurgitation 12/26/2022   Acute heart failure with preserved ejection fraction (HFpEF) (HCC) 12/20/2022   Chronic venous stasis 12/20/2022   Paroxysmal atrial fibrillation (HCC) 12/20/2022   Iron  deficiency anemia 05/17/2021   Thyroid  nodule 11/04/2020   Lymphedema 10/27/2020   Gastric mass    Polyp of sigmoid colon    Acute blood loss anemia 09/04/2020   COVID-19 virus infection    Hypercalcemia    Pica    Acute on chronic blood loss anemia 09/02/2020   Hyperlipidemia associated with type 2 diabetes mellitus (HCC) 09/02/2020   S/P drug eluting coronary stent placement 06/09/2020   Other hyperlipidemia 08/18/2019   T2DM (type 2 diabetes mellitus) (HCC) 01/14/2017   Essential hypertension 01/14/2017   OSA (obstructive sleep apnea) 01/14/2017   At risk for fluid volume overload 09/08/2016   BBB (bundle branch block) 08/30/2016   Obesity with body mass index (BMI) of 30.0 to 39.9 08/30/2016   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   1st  degree AV block 08/30/2016   CAD (coronary artery disease), s/p CABG x 3 08/20/2016   History of colonic polyps 11/05/2013     Past Medical History:  Diagnosis Date   (HFpEF) heart failure with preserved  ejection fraction (HCC) 07/25/2016   a.)TTE 07/25/16: EF >55, triv-mild pan regur, mild AS, G2DD; b.)TTE 09/02/16: EF >55, triv TR, mild AS; c.)TTE 11/05/16: EF >55, LAE, triv-mild pan regur, mild AS, G2DD; d.)TTE 01/21/20: EF >55, mod LVH, LAE/RVE, triv-mild AR/PR/TR, mod MR, mild AS, G2DD; e.)TTE 09/24/22: EF>55, sev LVH, sev LAE, RAE, mild PR, mod AR/MR/TR, mild-mod AS, RVSP 74.7; f.)TTE 12/23/22: EF 60-65, LAE, mild-mod MR/TR, mild AS   Abnormal urine 05/01/2023   Anemia    Aortic atherosclerosis    Aortic stenosis 07/25/2016   a.) TTE 07/25/2016: mild (MPG 13.5); b.) TTE 09/02/2016: mild (MPG 16); c.) TTE 11/05/2016: mild (MPG 9); d.) TTE 01/21/2020: mild (MPG 11.7); e.) TTE 09/24/2022: mild-mod (MPG 20.3); f.) TTE 12/23/2022: mild (MPG 15)   Atrial fibrillation (HCC)    a.) CHA2DS2VASc = 6 (age x2, CHF, HTN, vascular disease history, T2DM);  b.) rate/rhythm maintained on oral labetalol ; chronically anticoagulated with apixaban  + clopidogrel    AV block, 1st degree    B12 deficiency    CKD (chronic kidney disease), stage III (HCC)    Colon polyps    Congestive heart failure (HCC) 05/01/2023   Coronary artery disease 08/16/2016   a.) s/p 3v CABG 08/30/2016; b.) s/p PCI 11/26/2016 (DES x 1 oSVG-RCA); c.) s/p PCI 12/17/2016 (DES x 5 --> mLCx x2, dLCx, pLAD, dLAD); c.) s/p PCI 06/09/2020 (DES x1 --> ISR oSVG-PDA)   Cyst of kidney, acquired 07/03/2023   Dyspnea    Gastroesophageal reflux disease 05/01/2023   GERD (gastroesophageal reflux disease)    Heart murmur    Hepatic steatosis    History of 2019 novel coronavirus disease (COVID-19) 09/02/2020   a.) Tx'd with remdesivir    History of blood transfusion    History of GI bleed 09/02/2020   a.) admitted [facility]/02/2020 - 09/08/2020   History of heart artery stent 11/26/2016   TOTAL of 7 stents: a.) 11/26/2016 --> 3.5 x 22 mm Resolute Integrity oSVG-RCA; b.) 12/17/2016: 3.0 x 12 mm Xience Alpine dLCx, 3.5 x 22 mm Resolute Onyx mLCx, 3.5 x 18  mm Resolute Onyx mLCx, 3.0 x 26 mm Resolute Onyx pLAD, 2.0 x 26 mm Resolute Onyx dLAD; c.) 06/09/2020 --> 2.5 x 18 mm Resolute Onyx ISR oSVG-PDA   History of partial colectomy 08/12/2012   a.) large gastric hyperplastic polyp removal   HLD (hyperlipidemia)    Hypertension    LBBB (left bundle branch block)    Long term current use of anticoagulant    a.) apixaban    Long term current use of antithrombotics/antiplatelets    a.) clopidogrel    Male hypogonadism    a.) on exogenous TRT (1.62% gel)   Mild cardiomegaly    Mild gynecomastia (bilateral)    Nephrolithiasis    OSA (obstructive sleep apnea)    a.) does not utilize nocturnal PAP therapy   Personal history of surgery to heart and great vessels, presenting hazards to health 05/01/2023   Proteinuria, unspecified 08/01/2023   Pulmonary hypertension (HCC) 09/24/2022   a.) TTE 09/24/2022: RVSP 74.7; b.) TTE 12/23/2022: RVSP 31.3   RBBB (right bundle branch block with left anterior fascicular block)    Right inguinal hernia    Right thyroid  nodule    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  a.) LIMA-LAD, SVG-PDA, SVG-OM3   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/01/2023   Type 2 diabetes mellitus treated with insulin  (HCC)    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   Umbilical hernia    Unstable angina (HCC)    Vitamin D  deficiency      Past Surgical History:  Procedure Laterality Date   ANOMALOUS PULMONARY VENOUS RETURN REPAIR, TOTAL  2025   APPENDECTOMY     CARDIAC CATHETERIZATION Left 08/16/2016   Procedure: Left Heart Cath and Coronary Angiography;  Surgeon: Cara JONETTA Lovelace, MD;  Location: [facility] INVASIVE CV LAB;  Service: Cardiovascular;  Laterality: Left;   COLONOSCOPY N/A 09/07/2020   Procedure: COLONOSCOPY;  Surgeon: Janalyn Keene NOVAK, MD;  Location: [facility] ENDOSCOPY;  Service: Endoscopy;  Laterality: N/A;   COLONOSCOPY N/A 10/19/2023   Procedure: COLONOSCOPY;  Surgeon: Jinny Carmine, MD;   Location: [facility] ENDOSCOPY;  Service: Endoscopy;  Laterality: N/A;   COLONOSCOPY WITH PROPOFOL  N/A 05/17/2021   Procedure: COLONOSCOPY WITH PROPOFOL ;  Surgeon: Janalyn Keene NOVAK, MD;  Location: [facility] ENDOSCOPY;  Service: Endoscopy;  Laterality: N/A;   CORONARY ANGIOPLASTY WITH STENT PLACEMENT Left 11/26/2016   Procedure: CORONARY ANGIOPLASTY WITH STENT PLACEMENT; Location: [facility]; Surgeon: Alm Dais, MD   CORONARY ARTERY BYPASS GRAFT N/A 08/30/2016   Procedure: CORONARY ARTERY BYPASS GRAFT; Location: [facility]; Surgeon: Juliene Pouch, MD   CORONARY STENT INTERVENTION N/A 06/09/2020   Procedure: CORONARY STENT INTERVENTION;  Surgeon: Lovelace Cara JONETTA, MD;  Location: [facility];  Service: Cardiovascular;  Laterality: N/A;   CORONARY STENT INTERVENTION Left 12/17/2016   Procedure: CORONARY STENT INTERVENTION; Location: [facility]; Surgeon: Alm Dais, MD   CORONARY STENT INTERVENTION  03/2024   ENDOSCOPIC MUCOSAL RESECTION N/A 09/22/2020   Procedure: ENDOSCOPIC MUCOSAL RESECTION;  Surgeon: [name] Aloha Raddle., MD;  Location: [facility] ENDOSCOPY;  Service: Gastroenterology;  Laterality: N/A;   ESOPHAGOGASTRODUODENOSCOPY N/A 09/07/2020   Procedure: ESOPHAGOGASTRODUODENOSCOPY (EGD);  Surgeon: Janalyn Keene NOVAK, MD;  Location: [facility] ENDOSCOPY;  Service: Endoscopy;  Laterality: N/A;   ESOPHAGOGASTRODUODENOSCOPY N/A 10/19/2023   Procedure: EGD (ESOPHAGOGASTRODUODENOSCOPY);  Surgeon: Jinny Carmine, MD;  Location: [facility] ENDOSCOPY;  Service: Endoscopy;  Laterality: N/A;   ESOPHAGOGASTRODUODENOSCOPY (EGD) WITH PROPOFOL  N/A 09/22/2020   Procedure: ESOPHAGOGASTRODUODENOSCOPY (EGD) WITH PROPOFOL ;  Surgeon: [name] Aloha Raddle., MD;  Location: [facility] ENDOSCOPY;  Service: Gastroenterology;  Laterality: N/A;   ESOPHAGOGASTRODUODENOSCOPY (EGD) WITH PROPOFOL  N/A 08/26/2023   Procedure: ESOPHAGOGASTRODUODENOSCOPY (EGD) WITH PROPOFOL ;  Surgeon: Jinny Carmine, MD;  Location: [facility] ENDOSCOPY;  Service: Endoscopy;  Laterality:  N/A;   FRACTURE SURGERY     HEMOSTASIS CLIP PLACEMENT  09/22/2020   Procedure: HEMOSTASIS CLIP PLACEMENT;  Surgeon: [name] Aloha Raddle., MD;  Location: [facility] ENDOSCOPY;  Service: Gastroenterology;;   HEMOSTASIS CONTROL  09/22/2020   Procedure: HEMOSTASIS CONTROL;  Surgeon: [name] Aloha Raddle., MD;  Location: [facility] ENDOSCOPY;  Service: Gastroenterology;;   INSERTION OF MESH  02/19/2023   Procedure: INSERTION OF MESH;  Surgeon: Desiderio Schanz, MD;  Location: [facility] ORS;  Service: General;;  umbilical   LAPAROSCOPIC PARTIAL RIGHT COLECTOMY Right 08/12/2012   Procedure: LAPAROSCOPIC PARTIAL RIGHT COLECTOMY; Location: [facility]; Surgeon: Unknown Sharps, MD   LEFT HEART CATH AND CORONARY ANGIOGRAPHY Left 06/09/2020   Procedure: LEFT HEART CATH AND CORONARY ANGIOGRAPHY;  Surgeon: Lovelace Cara JONETTA, MD;  Location: [facility];  Service: Cardiovascular;  Laterality: Left;   LEFT HEART CATH AND CORS/GRAFTS ANGIOGRAPHY Left 11/20/2016   Procedure: Left Heart Cath and Cors/Grafts Angiography;  Surgeon: Cara JONETTA Lovelace,  MD;  Location: [facility];  Service: Cardiovascular;  Laterality: Left;   POLYPECTOMY  09/22/2020   Procedure: POLYPECTOMY;  Surgeon: [name] Aloha Raddle., MD;  Location: [facility] ENDOSCOPY;  Service: Gastroenterology;;   POLYPECTOMY  10/19/2023   Procedure: POLYPECTOMY;  Surgeon: Jinny Carmine, MD;  Location: [facility] ENDOSCOPY;  Service: Endoscopy;;   RIGHT HEART CATH Right 07/01/2024   Procedure: RIGHT HEART CATH;  Surgeon: Ammon Blunt, MD;  Location: [facility];  Service: Cardiovascular;  Laterality: Right;   SUBMUCOSAL LIFTING INJECTION  09/22/2020   Procedure: SUBMUCOSAL LIFTING INJECTION;  Surgeon: [name] Aloha Raddle., MD;  Location: [facility] ENDOSCOPY;  Service: Gastroenterology;;   UMBILICAL HERNIA REPAIR N/A 02/19/2023   Procedure: HERNIA REPAIR UMBILICAL ADULT, open;  Surgeon: Desiderio Schanz, MD;  Location: [facility] ORS;  Service: General;  Laterality: N/A;    XI ROBOTIC ASSISTED INGUINAL HERNIA REPAIR WITH MESH Right 02/19/2023   Procedure: XI ROBOTIC ASSISTED INGUINAL HERNIA REPAIR WITH MESH;  Surgeon: Desiderio Schanz, MD;  Location: [facility] ORS;  Service: General;  Laterality: Right;    Social History   Socioeconomic History   Marital status: Married    Spouse name: Not on file   Number of children: Not on file   Years of education: Not on file   Highest education level: Not on file  Occupational History   Not on file  Tobacco Use   Smoking status: Former    Current packs/day: 0.00    Types: Cigarettes    Quit date: 2000    Years since quitting: 25.9    Passive exposure: Never   Smokeless tobacco: Never  Vaping Use   Vaping status: Never Used  Substance and Sexual Activity   Alcohol use: No   Drug use: No   Sexual activity: Not Currently  Other Topics Concern   Not on file  Social History Narrative   Not on file   Social Drivers of Health   Financial Resource Strain: Low Risk  (05/08/2024)   Received from [facility]   Overall Financial Resource Strain (CARDIA)    Difficulty of Paying Living Expenses: Not hard at all  Food Insecurity: No Food Insecurity (07/18/2024)   Hunger Vital Sign    Worried About Running Out of Food in the Last Year: Never true    Ran Out of Food in the Last Year: Never true  Transportation Needs: No Transportation Needs (07/18/2024)   PRAPARE - Administrator, Civil Service (Medical): No    Lack of Transportation (Non-Medical): No  Physical Activity: Not on file  Stress: Not on file  Social Connections: Moderately Isolated (07/18/2024)   Social Connection and Isolation Panel    Frequency of Communication with Friends and Family: More than three times a week    Frequency of Social Gatherings with Friends and Family: More than three times a week    Attends Religious Services: Never    Database Administrator or Organizations: No    Attends Banker Meetings:  Never    Marital Status: Married  Catering Manager Violence: Not At Risk (07/18/2024)   Humiliation, Afraid, Rape, and Kick questionnaire    Fear of Current or Ex-Partner: No    Emotionally Abused: No    Physically Abused: No    Sexually Abused: No     Family History  Problem Relation Age of Onset   Hyperlipidemia Mother    Heart disease Mother    Hypertension Father  Current Facility-Administered Medications:    acetaminophen  (TYLENOL ) tablet 650 mg, 650 mg, Oral, Q6H PRN **OR** acetaminophen  (TYLENOL ) suppository 650 mg, 650 mg, Rectal, Q6H PRN, Cleatus Delayne GAILS, MD   aspirin  EC tablet 81 mg, 81 mg, Oral, Daily, Josette Ade, MD, 81 mg at 07/21/24 0846   atorvastatin  (LIPITOR ) tablet 80 mg, 80 mg, Oral, Daily, Cleatus Delayne GAILS, MD, 80 mg at 07/20/24 2312   carvedilol  (COREG ) tablet 3.125 mg, 3.125 mg, Oral, BID WC, Cleatus Delayne V, MD, 3.125 mg at 07/21/24 9152   cyanocobalamin  (VITAMIN B12) tablet 1,000 mcg, 1,000 mcg, Oral, Daily, Josette Ade, MD, 1,000 mcg at 07/21/24 9152   empagliflozin  (JARDIANCE ) tablet 10 mg, 10 mg, Oral, QAC breakfast, Cleatus Delayne GAILS, MD, 10 mg at 07/21/24 0848   furosemide  (LASIX ) injection 60 mg, 60 mg, Intravenous, BID, Josette Ade, MD, 60 mg at 07/21/24 0846   HYDROcodone -acetaminophen  (NORCO/VICODIN) 5-325 MG per tablet 1-2 tablet, 1-2 tablet, Oral, Q4H PRN, Cleatus Delayne GAILS, MD   isosorbide  mononitrate (IMDUR ) 24 hr tablet 120 mg, 120 mg, Oral, Daily, Cleatus Delayne V, MD, 120 mg at 07/21/24 0847   lactulose  (CHRONULAC ) 10 GM/15ML solution 20 g, 20 g, Oral, BID PRN, Cleatus Delayne GAILS, MD   nitroGLYCERIN  (NITROSTAT ) SL tablet 0.4 mg, 0.4 mg, Sublingual, Q5 min PRN, Cleatus Delayne GAILS, MD   ondansetron  (ZOFRAN ) tablet 4 mg, 4 mg, Oral, Q6H PRN **OR** ondansetron  (ZOFRAN ) injection 4 mg, 4 mg, Intravenous, Q6H PRN, Cleatus Delayne GAILS, MD   pantoprazole  (PROTONIX ) EC tablet 40 mg, 40 mg, Oral, BID, Josette Ade, MD, 40 mg at 07/21/24  0848   Physical exam:  Vitals:   07/21/24 0419 07/21/24 0500 07/21/24 1159 07/21/24 1515  BP: (!) 147/62  (!) 130/54 (!) 140/55  Pulse: 60  (!) 55 62  Resp: 19  16 16   Temp: (!) 97.4 F (36.3 C)  97.7 F (36.5 C) 97.6 F (36.4 C)  TempSrc: Oral     SpO2: 100%  97% 99%  Weight:  190 lb 14.7 oz (86.6 kg)    Height:       Physical Exam        Latest Ref Rng & Units 07/21/2024    4:00 AM  CMP  Glucose 70 - 99 mg/dL 845   BUN 8 - 23 mg/dL 40   Creatinine 9.38 - 1.24 mg/dL 8.10   Sodium 864 - 854 mmol/L 138   Potassium 3.5 - 5.1 mmol/L 4.2   Chloride 98 - 111 mmol/L 99   CO2 22 - 32 mmol/L 31   Calcium  8.9 - 10.3 mg/dL 9.5       Latest Ref Rng & Units 07/21/2024    4:00 AM  CBC  WBC 4.0 - 10.5 K/uL 5.4   Hemoglobin 13.0 - 17.0 g/dL 8.4   Hematocrit 60.9 - 52.0 % 26.9   Platelets 150 - 400 K/uL 127     @IMAGES @  CT CHEST ABDOMEN PELVIS WO CONTRAST Result Date: 07/20/2024 EXAM: CT CHEST, ABDOMEN AND PELVIS WITHOUT CONTRAST 07/20/2024 06:52:00 PM TECHNIQUE: CT of the chest, abdomen and pelvis was performed without the administration of intravenous contrast. Multiplanar reformatted images are provided for review. Automated exposure control, iterative reconstruction, and/or weight based adjustment of the mA/kV was utilized to reduce the radiation dose to as low as reasonably achievable. COMPARISON: 08/20/2023 CLINICAL HISTORY: LDH elevation. Oncology wanted to see if cancerous process. Okay for oral contrast but not intravenous. FINDINGS: CHEST: MEDIASTINUM AND LYMPH NODES: Status post transcatheter  aortic valve replacement and coronary artery bypass grafting. Mild cardiomegaly. No pericardial effusion. The central pulmonary arteries are enlarged in keeping with changes of pulmonary arterial hypertension. Mild atherosclerotic calcification within the thoracic aorta. No aortic aneurysm. There is progressive pathologic mediastinal adenopathy. By example, previously measured left  prevascular and precarinal lymph nodes now measure 12 mm (previously 10 mm) and 20 mm (previously 10 mm) in short axis diameter. The findings suggest either a chronic inflammatory or low-grade lymphoproliferative process. Stable 2.3 cm right thyroid  nodule, better assessed on thyroid  sonogram of 10/12/2020. No follow up imaging is recommended. LUNGS AND PLEURA: Small to moderate bilateral pleural effusions, right greater than left, appears similar to prior examination though there is new pleural thickening bilaterally as well as probable septation noted at the right basilar pleural space which is nonspecific in the setting of repeated thoracentesis. Small focus of pleural gas on the right may relate to recent thoracentesis. No focal consolidation or pulmonary edema. No pneumothorax. ABDOMEN AND PELVIS: LIVER: The liver is unremarkable. GALLBLADDER AND BILE DUCTS: Cholelithiasis without superimposed pericholecystic inflammatory change. No intra or extrahepatic biliary ductal dilation. SPLEEN: No acute abnormality. PANCREAS: No acute abnormality. ADRENAL GLANDS: No acute abnormality. KIDNEYS, URETERS AND BLADDER: Simple exophytic cortical cysts are seen within the kidneys bilaterally for which no follow up imaging is recommended. Of a hyperdense exophytic cortical cyst arises anteriorly but little lower pole of the right kidney but is unchanged from prior examination and was better characterized as a simple cyst on that exam. Per consensus, no follow-up is needed for simple Bosniak type 1 and 2 renal cysts, unless the patient has a malignancy history or risk factors. 7 mm nonobstructing calculus within the lower pole of the left kidney. The kidneys are otherwise unremarkable. No stones in the ureters. No hydronephrosis. No perinephric or periureteral stranding. Urinary bladder is unremarkable. GI AND BOWEL: Moderate stool burden within the distal colon and rectum without evidence of obstruction. The stomach, small  bowel, and large bowel are otherwise unremarkable. Appendix absent. REPRODUCTIVE ORGANS: No acute abnormality. PERITONEUM AND RETROPERITONEUM: No ascites. No free air. VASCULATURE: Aorta is normal in caliber. Extensive aortic iliac atherosclerotic calcification. ABDOMINAL AND PELVIS LYMPH NODES: Pathologic aortic cable adenopathy with a single lymph node measuring 15 mm in short axis diameter is stable since prior examination, nonspecific. No additional pathologic adenopathy within the abdomen and pelvis. REPRODUCTIVE ORGANS: No acute abnormality. BONES AND SOFT TISSUES: Osseous structures are age appropriate. No acute bone abnormality. No lytic or blastic bone lesion. No focal soft tissue abnormality. IMPRESSION: 1. Progressive pathologic mediastinal adenopathy, suggestive of a chronic inflammatory or low-grade lymphoproliferative process. 2. Small to moderate bilateral pleural effusions, right greater than left, with new bilateral pleural thickening and probable right basilar pleural space septation; small right pleural gas likely post-thoracentesis. 3. Stable pathologic aortocaval adenopathy measuring 15 mm in short axis, nonspecific. 4. Stable 2.3 cm right thyroid  nodule; no follow-up imaging is recommended. 5. Cholelithiasis without acute inflammatory change and without biliary ductal dilation. 6. Moderate stool burden in the distal colon and rectum without obstruction. 7. RAF score: Aortic Atherosclerosis (ICD10-I70.0). Electronically signed by: Dorethia Molt MD 07/20/2024 08:42 PM EST RP Workstation: [id number]   DG Chest Port 1 View Result Date: 07/20/2024 CLINICAL DATA:  Status post right thoracentesis. EXAM: PORTABLE CHEST 1 VIEW COMPARISON:  07/18/2024 FINDINGS: Small to moderate-sized right pleural effusion, decreased in size. No pneumothorax. No significant change in right basilar atelectasis and possible pneumonia. Stable small left pleural effusion. Increased  linear and patchy density at the left  lung base. Atheromatous aortic arch calcifications. Diffuse osteopenia. IMPRESSION: 1. Small to moderate-sized right pleural effusion, decreased in size following thoracentesis. 2. No pneumothorax. 3. No significant change in right basilar atelectasis and possible pneumonia. 4. Increased left basilar atelectasis and possible pneumonia. 5. Stable small left pleural effusion. Electronically Signed   By: Elspeth Bathe M.D.   On: 07/20/2024 14:34   US  THORACENTESIS ASP PLEURAL SPACE W/IMG GUIDE Result Date: 07/20/2024 INDICATION: 78 year old male with an extensive cardiac history, including heart failure and aortic valve replacement in 01/2024, and severe pulmonary hypertension with recurrent right-sided pleural effusions. Request for diagnostic and therapeutic thoracentesis. EXAM: ULTRASOUND GUIDED DIAGNOSTIC AND THERAPEUTIC, RIGHT-SIDED THORACENTESIS MEDICATIONS: 1% lidocaine , 8 mL. COMPLICATIONS: None immediate. PROCEDURE: An ultrasound guided thoracentesis was thoroughly discussed with the patient and questions answered. The benefits, risks, alternatives and complications were also discussed. The patient understands and wishes to proceed with the procedure. Written consent was obtained. Ultrasound was performed and was notable for several loculations throughout the right chest. An adequate pocket of fluid was identified in the right chest. The area was then prepped and draped in the normal sterile fashion. 1% Lidocaine  was used for local anesthesia. Under ultrasound guidance a 6 Fr Safe-T-Centesis catheter was introduced. Thoracentesis was performed. Unfortunately, due to the septations, only a small amount of fluid was able to be drained. Residual fluid noted to the right lateral chest on post procedure ultrasound. The catheter was removed and a dressing applied. FINDINGS: A total of approximately 450 mL of amber fluid was removed. Samples were sent to the laboratory as requested by the clinical team. IMPRESSION:  Successful ultrasound guided right-sided thoracentesis yielding 450 mL of pleural fluid. Procedure performed by: Sherrilee Bal, PA-C under the supervision of Dr. ONEIDA Specking. Electronically Signed   By: CHRISTELLA.  Shick M.D.   On: 07/20/2024 11:14   DG Chest 2 View Result Date: 07/18/2024 CLINICAL DATA:  Chest pain and shortness of breath EXAM: CHEST - 2 VIEW COMPARISON:  07/02/2024 FINDINGS: Frontal and lateral views of the chest demonstrate stable postsurgical changes from median sternotomy and aortic valve replacement. Cardiac silhouette is enlarged but stable. Persistent bibasilar veiling opacities consistent with consolidation and effusions, right greater than left. No pneumothorax. No acute bony abnormalities. IMPRESSION: 1. Stable bibasilar consolidation and effusions, right greater than left. 2. Stable enlarged cardiac silhouette. Electronically Signed   By: Ozell Daring M.D.   On: 07/18/2024 14:53   US  Venous Img Lower Unilateral Left (DVT) Result Date: 07/02/2024 CLINICAL DATA:  Swelling. EXAM: LEFT LOWER EXTREMITY VENOUS DOPPLER ULTRASOUND TECHNIQUE: Gray-scale sonography with graded compression, as well as color Doppler and duplex ultrasound were performed to evaluate the lower extremity deep venous systems from the level of the common femoral vein and including the common femoral, femoral, profunda femoral, popliteal and calf veins including the posterior tibial, peroneal and gastrocnemius veins when visible. Spectral Doppler was utilized to evaluate flow at rest and with distal augmentation maneuvers in the common femoral, femoral and popliteal veins. COMPARISON:  12/20/2022 FINDINGS: Contralateral Common Femoral Vein: Respiratory phasicity is normal and symmetric with the symptomatic side. No evidence of thrombus. Normal compressibility. Common Femoral Vein: No evidence of thrombus. Normal compressibility, respiratory phasicity and response to augmentation. Saphenofemoral Junction: No evidence of  thrombus. Normal compressibility and flow on color Doppler imaging. Profunda Femoral Vein: No evidence of thrombus. Normal compressibility and flow on color Doppler imaging. Femoral Vein: No evidence of thrombus. Normal compressibility,  respiratory phasicity and response to augmentation. Popliteal Vein: No evidence of thrombus. Normal compressibility, respiratory phasicity and response to augmentation. Calf Veins: No evidence of thrombus. Normal compressibility and flow on color Doppler imaging. Other Findings:  None. IMPRESSION: Negative for deep venous thrombosis in left lower extremity. Electronically Signed   By: Juliene Balder M.D.   On: 07/02/2024 17:01   US  THORACENTESIS ASP PLEURAL SPACE W/IMG GUIDE Result Date: 07/02/2024 INDICATION: 78 year old male presents with dyspnea and right-sided pleural effusion. Received request for diagnostic and therapeutic thoracentesis. EXAM: ULTRASOUND GUIDED DIAGNOSTIC AND THERAPEUTIC, RIGHT-SIDED THORACENTESIS MEDICATIONS: 10 mL 1% lidocaine  COMPLICATIONS: None immediate. PROCEDURE: An ultrasound guided thoracentesis was thoroughly discussed with the patient and questions answered. The benefits, risks, alternatives and complications were also discussed. The patient understands and wishes to proceed with the procedure. Written consent was obtained. Ultrasound was performed to localize and mark an adequate pocket of fluid in the right chest. The area was then prepped and draped in the normal sterile fashion. 1% lidocaine  was used for local anesthesia. Under ultrasound guidance a 6 Fr Safe-T-Centesis catheter was introduced. Thoracentesis was performed. The catheter was removed and a dressing applied. FINDINGS: A total of approximately 700 mL of amber fluid was removed. Samples were sent to the laboratory as requested by the clinical team. IMPRESSION: Successful ultrasound guided right thoracentesis yielding 700 mL of pleural fluid. Performed by: Rayfield Buff, NP under the  direct supervision of Dr Cordella Banner Electronically Signed   By: Cordella Banner   On: 07/02/2024 16:09   DG Chest Port 1 View Result Date: 07/02/2024 CLINICAL DATA:  Status post thoracentesis EXAM: PORTABLE CHEST 1 VIEW COMPARISON:  Chest x-ray 07/02/2024 FINDINGS: There are small bilateral pleural effusions, decreased on the right. There is no pneumothorax. Cardiomediastinal silhouette is enlarged unchanged. Sternotomy wires and fasteners are again seen. IMPRESSION: Small bilateral pleural effusions, decreased on the right. No pneumothorax. Electronically Signed   By: Greig Pique M.D.   On: 07/02/2024 15:53   DG Chest Port 1 View Result Date: 07/02/2024 EXAM: 1 VIEW(S) XRAY OF THE CHEST 07/02/2024 07:30:00 AM COMPARISON: 07/01/2024 CLINICAL HISTORY: Shortness of breath FINDINGS: LUNGS AND PLEURA: Mild pulmonary edema. Moderate right and small left pleural effusions with associated bibasilar opacities. No pneumothorax. HEART AND MEDIASTINUM: Cardiomegaly, unchanged. Prior CABG and aortic valve replacement noted. BONES AND SOFT TISSUES: No acute osseous abnormality. IMPRESSION: 1. Mild pulmonary edema. 2. Moderate right and small left pleural effusions 3. No change in bilateral pulmonary opacities, which may reflect areas of atelectasis and/or consolidation. Electronically signed by: Waddell Calk MD 07/02/2024 11:40 AM EST RP Workstation: [id number]   DG Chest 2 View Result Date: 07/01/2024 CLINICAL DATA:  Shortness of breath. EXAM: CHEST - 2 VIEW COMPARISON:  03/03/2024. FINDINGS: Cardiomegaly, unchanged. Prior valve replacement and CABG. Median sternotomy. Aortic atherosclerosis. Moderate right and small left pleural effusions with bibasilar opacities, likely reflecting atelectasis. No pneumothorax. No acute osseous abnormality. IMPRESSION: 1. Moderate right and small left pleural effusions with bibasilar atelectasis. 2. Cardiomegaly. Electronically Signed   By: Harrietta Sherry M.D.   On:  07/01/2024 13:26   CARDIAC CATHETERIZATION Result Date: 07/01/2024   Hemodynamic findings consistent with severe pulmonary hypertension. 1.   RA 15/15 mmHg  RV 87/21  PA 86/42  (51)  PCWP 45/24  (32) 2.  Severe pulmonary hypertension     Assessment and plan- Patient is a 78 y.o. male ***   Thank you for this kind referral and the opportunity to  participate in the care of this  Patient   Visit Diagnosis 1. Chronic respiratory failure with hypoxia (HCC)     Dr. Annah Skene, MD, MPH [facility] at [facility] [phone] 07/21/2024

## 2024-07-21 NOTE — Care Management Important Message (Signed)
 Important Message  Patient Details  Name: [name] MRN: [id number] Date of Birth: November 02, 1945   Important Message Given:  Yes - Medicare IM     [name] 07/21/2024, 5:09 PM

## 2024-07-21 NOTE — Progress Notes (Signed)
 [facility] CLINIC CARDIOLOGY PROGRESS NOTE   Patient ID: [name] MRN: [id number] DOB/AGE: 1946/05/19 78 y.o.  Admit date: 07/18/2024 Referring Physician Dr. Delayne Solian  Primary Physician [name], MD  Primary Cardiologist Dr. Ammon Reason for Consultation AoCHF  HPI: [name] is a 78 y.o. male with a past medical history of chronic HFpEF, persistent atrial fibrillation, aortic stenosis s/p TAVR (01/2024), coronary artery disease s/p CABG x3, multiple coronary stents, most recent stent to ostial SVG in-stent restenosis (2021), hypertension, hyperlipidemia, OSA, CKD, diabetes who presented to the ED on 07/18/2024 for SOB. Cardiology was consulted for further evaluation.   Interval History:  -Patient seen and examined this AM, s/p thoracentesis and transfusion yesterday. States his SOB is stable but appears much more comfortable on 2L.  -Reports good UOP with diuresis. LE edema still persists.  -Cr stable on AM labs, Hgb 8.4.  Review of systems complete and found to be negative unless listed above   Vitals:   07/20/24 1921 07/20/24 2308 07/21/24 0419 07/21/24 0500  BP: (!) 135/56 (!) 162/66 (!) 147/62   Pulse: (!) 59 (!) 40 60   Resp: 20 20 19    Temp: (!) 97.4 F (36.3 C) 97.9 F (36.6 C) (!) 97.4 F (36.3 C)   TempSrc:   Oral   SpO2: 96% 95% 100%   Weight:    86.6 kg  Height:         Intake/Output Summary (Last 24 hours) at 07/21/2024 9073 Last data filed at 07/20/2024 2100 Gross per 24 hour  Intake 1869.83 ml  Output 1800 ml  Net 69.83 ml     PHYSICAL EXAM General: Chronically ill appearing male, well nourished, in no acute distress. HEENT: Normocephalic and atraumatic. Neck: No JVD.  Lungs: Normal respiratory effort on 2L [place]. Clear bilaterally to auscultation. No wheezes, crackles, rhonchi.  Heart: Irregularly irregular. Normal S1 and S2 without gallops or murmurs. Radial & DP pulses 2+ bilaterally. Abdomen: Non-distended appearing.  Msk:  Normal strength and tone for age. Extremities: No clubbing, cyanosis or edema.   Neuro: Alert and oriented X 3. Psych: Mood appropriate, affect congruent.    LABS: Basic Metabolic Panel: Recent Labs    07/20/24 0425 07/21/24 0400  NA 140 138  K 4.1 4.2  CL 103 99  CO2 30 31  GLUCOSE 144* 154*  BUN 41* 40*  CREATININE 1.89* 1.89*  CALCIUM  10.1 9.5   Liver Function Tests: Recent Labs    07/20/24 1042  AST 30  ALT 10  ALKPHOS 110  BILITOT 1.1  PROT 7.0  ALBUMIN 3.6   No results for input(s): LIPASE, AMYLASE in the last 72 hours. CBC: Recent Labs    07/20/24 0425 07/21/24 0400  WBC 5.4 5.4  HGB 7.2* 8.4*  HCT 23.5* 26.9*  MCV 101.3* 100.7*  PLT 125* 127*   Cardiac Enzymes: No results for input(s): CKTOTAL, CKMB, CKMBINDEX, TROPONINIHS in the last 72 hours. BNP: No results for input(s): BNP in the last 72 hours. D-Dimer: No results for input(s): DDIMER in the last 72 hours. Hemoglobin A1C: No results for input(s): HGBA1C in the last 72 hours. Fasting Lipid Panel: No results for input(s): CHOL, HDL, LDLCALC, TRIG, CHOLHDL, LDLDIRECT in the last 72 hours. Thyroid  Function Tests: No results for input(s): TSH, T4TOTAL, T3FREE, THYROIDAB in the last 72 hours.  Invalid input(s): FREET3 Anemia Panel: No results for input(s): VITAMINB12, FOLATE, FERRITIN, TIBC, IRON , RETICCTPCT in the last 72 hours.  CT CHEST ABDOMEN PELVIS WO CONTRAST  Result Date: 07/20/2024 EXAM: CT CHEST, ABDOMEN AND PELVIS WITHOUT CONTRAST 07/20/2024 06:52:00 PM TECHNIQUE: CT of the chest, abdomen and pelvis was performed without the administration of intravenous contrast. Multiplanar reformatted images are provided for review. Automated exposure control, iterative reconstruction, and/or weight based adjustment of the mA/kV was utilized to reduce the radiation dose to as low as reasonably achievable. COMPARISON: 08/20/2023 CLINICAL HISTORY: LDH  elevation. Oncology wanted to see if cancerous process. Okay for oral contrast but not intravenous. FINDINGS: CHEST: MEDIASTINUM AND LYMPH NODES: Status post transcatheter aortic valve replacement and coronary artery bypass grafting. Mild cardiomegaly. No pericardial effusion. The central pulmonary arteries are enlarged in keeping with changes of pulmonary arterial hypertension. Mild atherosclerotic calcification within the thoracic aorta. No aortic aneurysm. There is progressive pathologic mediastinal adenopathy. By example, previously measured left prevascular and precarinal lymph nodes now measure 12 mm (previously 10 mm) and 20 mm (previously 10 mm) in short axis diameter. The findings suggest either a chronic inflammatory or low-grade lymphoproliferative process. Stable 2.3 cm right thyroid  nodule, better assessed on thyroid  sonogram of 10/12/2020. No follow up imaging is recommended. LUNGS AND PLEURA: Small to moderate bilateral pleural effusions, right greater than left, appears similar to prior examination though there is new pleural thickening bilaterally as well as probable septation noted at the right basilar pleural space which is nonspecific in the setting of repeated thoracentesis. Small focus of pleural gas on the right may relate to recent thoracentesis. No focal consolidation or pulmonary edema. No pneumothorax. ABDOMEN AND PELVIS: LIVER: The liver is unremarkable. GALLBLADDER AND BILE DUCTS: Cholelithiasis without superimposed pericholecystic inflammatory change. No intra or extrahepatic biliary ductal dilation. SPLEEN: No acute abnormality. PANCREAS: No acute abnormality. ADRENAL GLANDS: No acute abnormality. KIDNEYS, URETERS AND BLADDER: Simple exophytic cortical cysts are seen within the kidneys bilaterally for which no follow up imaging is recommended. Of a hyperdense exophytic cortical cyst arises anteriorly but little lower pole of the right kidney but is unchanged from prior examination and  was better characterized as a simple cyst on that exam. Per consensus, no follow-up is needed for simple Bosniak type 1 and 2 renal cysts, unless the patient has a malignancy history or risk factors. 7 mm nonobstructing calculus within the lower pole of the left kidney. The kidneys are otherwise unremarkable. No stones in the ureters. No hydronephrosis. No perinephric or periureteral stranding. Urinary bladder is unremarkable. GI AND BOWEL: Moderate stool burden within the distal colon and rectum without evidence of obstruction. The stomach, small bowel, and large bowel are otherwise unremarkable. Appendix absent. REPRODUCTIVE ORGANS: No acute abnormality. PERITONEUM AND RETROPERITONEUM: No ascites. No free air. VASCULATURE: Aorta is normal in caliber. Extensive aortic iliac atherosclerotic calcification. ABDOMINAL AND PELVIS LYMPH NODES: Pathologic aortic cable adenopathy with a single lymph node measuring 15 mm in short axis diameter is stable since prior examination, nonspecific. No additional pathologic adenopathy within the abdomen and pelvis. REPRODUCTIVE ORGANS: No acute abnormality. BONES AND SOFT TISSUES: Osseous structures are age appropriate. No acute bone abnormality. No lytic or blastic bone lesion. No focal soft tissue abnormality. IMPRESSION: 1. Progressive pathologic mediastinal adenopathy, suggestive of a chronic inflammatory or low-grade lymphoproliferative process. 2. Small to moderate bilateral pleural effusions, right greater than left, with new bilateral pleural thickening and probable right basilar pleural space septation; small right pleural gas likely post-thoracentesis. 3. Stable pathologic aortocaval adenopathy measuring 15 mm in short axis, nonspecific. 4. Stable 2.3 cm right thyroid  nodule; no follow-up imaging is recommended. 5. Cholelithiasis without acute  inflammatory change and without biliary ductal dilation. 6. Moderate stool burden in the distal colon and rectum without  obstruction. 7. RAF score: Aortic Atherosclerosis (ICD10-I70.0). Electronically signed by: Dorethia Molt MD 07/20/2024 08:42 PM EST RP Workstation: [id number]   DG Chest Port 1 View Result Date: 07/20/2024 CLINICAL DATA:  Status post right thoracentesis. EXAM: PORTABLE CHEST 1 VIEW COMPARISON:  07/18/2024 FINDINGS: Small to moderate-sized right pleural effusion, decreased in size. No pneumothorax. No significant change in right basilar atelectasis and possible pneumonia. Stable small left pleural effusion. Increased linear and patchy density at the left lung base. Atheromatous aortic arch calcifications. Diffuse osteopenia. IMPRESSION: 1. Small to moderate-sized right pleural effusion, decreased in size following thoracentesis. 2. No pneumothorax. 3. No significant change in right basilar atelectasis and possible pneumonia. 4. Increased left basilar atelectasis and possible pneumonia. 5. Stable small left pleural effusion. Electronically Signed   By: Elspeth Bathe M.D.   On: 07/20/2024 14:34   US  THORACENTESIS ASP PLEURAL SPACE W/IMG GUIDE Result Date: 07/20/2024 INDICATION: 78 year old male with an extensive cardiac history, including heart failure and aortic valve replacement in 01/2024, and severe pulmonary hypertension with recurrent right-sided pleural effusions. Request for diagnostic and therapeutic thoracentesis. EXAM: ULTRASOUND GUIDED DIAGNOSTIC AND THERAPEUTIC, RIGHT-SIDED THORACENTESIS MEDICATIONS: 1% lidocaine , 8 mL. COMPLICATIONS: None immediate. PROCEDURE: An ultrasound guided thoracentesis was thoroughly discussed with the patient and questions answered. The benefits, risks, alternatives and complications were also discussed. The patient understands and wishes to proceed with the procedure. Written consent was obtained. Ultrasound was performed and was notable for several loculations throughout the right chest. An adequate pocket of fluid was identified in the right chest. The area was then  prepped and draped in the normal sterile fashion. 1% Lidocaine  was used for local anesthesia. Under ultrasound guidance a 6 Fr Safe-T-Centesis catheter was introduced. Thoracentesis was performed. Unfortunately, due to the septations, only a small amount of fluid was able to be drained. Residual fluid noted to the right lateral chest on post procedure ultrasound. The catheter was removed and a dressing applied. FINDINGS: A total of approximately 450 mL of amber fluid was removed. Samples were sent to the laboratory as requested by the clinical team. IMPRESSION: Successful ultrasound guided right-sided thoracentesis yielding 450 mL of pleural fluid. Procedure performed by: Sherrilee Bal, PA-C under the supervision of Dr. ONEIDA Specking. Electronically Signed   By: CHRISTELLA.  Shick M.D.   On: 07/20/2024 11:14     ECHO 02/2024: 1. Left ventricular ejection fraction, by estimation, is 60 to 65%. The left ventricle has normal function. The left ventricle has no regional wall motion abnormalities. Left ventricular diastolic parameters were normal.   2. Right ventricular systolic function is normal. The right ventricular size is normal.   3. The mitral valve is normal in structure. Moderate to severe mitral  valve regurgitation. No evidence of mitral stenosis.   4. Tricuspid valve regurgitation is mild to moderate.   5. The aortic valve is normal in structure. Aortic valve regurgitation is  mild. No aortic stenosis is present.   6. The inferior vena cava is normal in size with greater than 50%  respiratory variability, suggesting right atrial pressure of 3 mmHg.   [facility] 03/2024: NORMAL LEFT VENTRICULAR SYSTOLIC FUNCTION WITH SEVERE LVH  ESTIMATED EF: >55%, CALC EF(3D): 55%  ELEVATED LA PRESSURES WITH DIASTOLIC DYSFUNCTION (GRADE 3)  NORMAL RIGHT VENTRICULAR SYSTOLIC FUNCTION  VALVULAR REGURGITATION: MILD AR, MILD MR, TRIVIAL PR, MILD TR  ESTIMATED RVSP: 85 mmHg (ABNORMAL)  VALVULAR STENOSIS:  TRANSCATHETER  BIOPROSTHETIC, No MS, No PS, No TS    TELEMETRY (personally reviewed): atrial fibrillation rate 60-70s  EKG (personally reviewed): atrial fibrillation rate 66 bpm  DATA reviewed by me 07/21/24: last 24h vitals tele labs imaging I/O, hospitalist progress note  Principal Problem:   Acute on chronic heart failure with preserved ejection fraction (HCC) Active Problems:   Essential hypertension   OSA (obstructive sleep apnea)   CAD (coronary artery disease), s/p CABG x 3   Paroxysmal atrial fibrillation (HCC)   Acute hypoxic respiratory failure (HCC)   Severe mitral regurgitation   Stage 3b chronic kidney disease (HCC)   Recurrent pleural effusion on right   Anemia in chronic kidney disease   S/P TAVR (transcatheter aortic valve replacement)   Moderate to severe mitral and tricuspid regurgitation   Possible fluid overload due to blood transfusion (11/20. 11/22, 07/16/2024)    ASSESSMENT AND PLAN: JONAN SEUFERT is a 78 y.o. male with a past medical history of chronic HFpEF, persistent atrial fibrillation, aortic stenosis s/p TAVR (01/2024), coronary artery disease s/p CABG x3, multiple coronary stents, most recent stent to ostial SVG in-stent restenosis (2021), hypertension, hyperlipidemia, OSA, CKD, diabetes who presented to the ED on 07/18/2024 for SOB. Cardiology was consulted for further evaluation.   # Acute hypoxic respiratory failure # Acute on chronic HFpEF # Bilateral pleural effusions # Coronary artery disease s/p CABG # Demand ischemia # Aortic stenosis s/p TAVR 01/2024 Patient presented to ED for evaluation of SOB.  BNP elevated at 13,000.  Trops mild and flat.  Chest x-ray with bilateral pleural effusions. S/p thoracentesis with 450 mL output 07/20/2024. S/p transfusion 07/20/2024. - Continue IV Lasix  60 mg twice daily.   - Plavix  held given anemia. Continue home atorvastatin  80 mg daily, aspirin  81 mg daily. - Home amlodipine  held. Continue home Imdur  120 mg daily,  carvedilol  3.125 mg twice daily, jardiance  10 mg daily.  - Mild troponin elevation most consistent with demand/supply mismatch and not ACS.  - Patient with history of recurrent pleural effusions, will likely require thoracentesis during admission.  This patient's case was discussed and created with Dr. Ammon and he is in agreement.  Signed:  Danita Bloch, PA-C  07/21/2024, 9:26 AM [facility] Cardiology

## 2024-07-21 NOTE — Care Management Important Message (Signed)
 Important Message  Patient Details  Name: [name] MRN: [id number] Date of Birth: [birth year]   Important Message Given:        [name] 07/21/2024, 5:08 PM

## 2024-07-21 NOTE — Evaluation (Signed)
 Physical Therapy Evaluation Patient Details Name: [name] MRN: [id number] DOB: [birth year] Today's Date: 07/21/2024  History of Present Illness  Pt is a 78 y.o. 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 MD assessment includes: acute on chronic heart failure with hypoxia, possible fluid overload secondary to blood transfusions, recurrent pleural effusion, pulm HTN, mod to severe MR/TR.    Clinical Impression  Pt was found sitting up in chair on arrival, was easily frustrated with initial evaluation questions, but came around eventually and was receptive working with PT throughout the session. Pt is currently on 2L [place], and was educated on the benefits of PT while monitoring O2 levels while weaning to RA. Pt's SpO2 was monitored throughout (see details below). Pt ambulated approx 83ft without any LOB or knee buckling, and reported only slight SOB at the end of activity, but recovered quickly with pursed lip breathing while sitting in chair. Pt reports having good control over his current health situation with adequate support at home and generally disinterested with PT outside of the hospital; will maintain on caseload while in hospital, no further PT services needed upon discharge.      If plan is discharge home, recommend the following: Assistance with cooking/housework;Assist for transportation   Can travel by private vehicle        Equipment Recommendations    Recommendations for Other Services       Functional Status Assessment Patient has had a recent decline in their functional status and demonstrates the  ability to make significant improvements in function in a reasonable and predictable amount of time.     Precautions / Restrictions Precautions Precautions: Fall Recall of Precautions/Restrictions: Intact Precaution/Restrictions Comments: monitor O2 Restrictions Weight Bearing Restrictions Per Provider Order: No      Mobility  Bed Mobility               General bed mobility comments: NT pt in recliner pre and post session    Transfers Overall transfer level: Modified independent Equipment used: None               General transfer comment: pt stands without AD and amulates approx 20 ft --supervision assist    Ambulation/Gait Ambulation/Gait assistance: Supervision Gait Distance (Feet): 20 Feet Assistive device: None Gait Pattern/deviations: Step-through pattern, crouch posture, bilat knee flexion, but no buckling noted during session Gait velocity: slighty decreased     General Gait Details: monitor O2 tubing while ambulating  Stairs            Wheelchair Mobility     Tilt Bed    Modified Rankin (Stroke Patients Only)       Balance Overall balance assessment: Modified Independent Sitting-balance support: Feet supported Sitting balance-Leahy Scale: Normal     Standing balance support: No upper extremity supported, During functional activity Standing balance-Leahy Scale: Good                               Pertinent Vitals/Pain Pain Assessment Pain Assessment: No/denies pain    Home Living Family/patient expects to be discharged to:: Private residence Living Arrangements: Spouse/significant other Available Help  at Discharge: Family Type of Home: House Home Access: Stairs to enter Entrance Stairs-Rails: Right;Left;Can reach both Secretary/administrator of Steps: 3     Home Equipment: Cane - single Librarian, Academic (2 wheels)      Prior Function Prior Level of Function : Independent/Modified Independent;Driving              Mobility Comments: Uses SPC when blood count is low, otherwise independent without AD, denies falls, not on home O2 ADLs Comments: IND     Extremity/Trunk Assessment   Upper Extremity Assessment Upper Extremity Assessment: Defer to OT evaluation    Lower Extremity Assessment Lower Extremity Assessment: Overall WFL for tasks assessed       Communication   Communication Communication: No apparent difficulties    Cognition Arousal: Alert Behavior During Therapy: WFL for tasks assessed/performed   PT - Cognitive impairments: No apparent impairments                         Following commands: Intact       Cueing Cueing Techniques: Verbal cues     General Comments General comments (skin integrity, edema, etc.): pt was on 2L [place] and SpO2 was monitored at  >90% at rest and 89% with activity    Exercises Other Exercises Other Exercises: Edu on the role of PT in acute setting as it pertains to checking O2, monitoring adjustments with acitivty   Assessment/Plan    PT Assessment Patient needs continued PT services  PT Problem List Cardiopulmonary status limiting activity;Decreased activity tolerance;Decreased balance;Decreased mobility;Decreased strength;Decreased safety awareness       PT Treatment Interventions DME instruction;Balance training;Gait training;Stair training;Functional mobility training;Patient/family education;Therapeutic activities;Therapeutic exercise    PT Goals (Current goals can be found in the Care Plan section)       Frequency Min 1X/week     Co-evaluation               AM-PAC PT 6 Clicks Mobility  Outcome Measure Help needed turning from your back to your side while in a flat bed without using bedrails?: None Help needed moving from lying on your back to sitting on the side of a flat bed without using bedrails?: None Help needed moving to and from a bed to a chair (including a wheelchair)?: None Help needed  standing up from a chair using your arms (e.g., wheelchair or bedside chair)?: None Help needed to walk in hospital room?: None Help needed climbing 3-5 steps with a railing? : A Little 6 Click Score: 23    End of Session Equipment Utilized During Treatment: Oxygen Activity Tolerance: Patient tolerated treatment well Patient left: in chair Nurse Communication: Mobility status PT Visit Diagnosis: Other abnormalities of gait and mobility (R26.89)    Time: 8971-8954 PT Time Calculation (min) (ACUTE ONLY): 17 min   Charges:                Corean Newport, SPT 07/21/24, 4:32 PM

## 2024-07-22 ENCOUNTER — Inpatient Hospital Stay

## 2024-07-22 LAB — BASIC METABOLIC PANEL WITH GFR
Anion gap: 10 (ref 5–15)
BUN: 39 mg/dL — ABNORMAL HIGH (ref 8–23)
CO2: 31 mmol/L (ref 22–32)
Calcium: 10 mg/dL (ref 8.9–10.3)
Chloride: 97 mmol/L — ABNORMAL LOW (ref 98–111)
Creatinine, Ser: 2.09 mg/dL — ABNORMAL HIGH (ref 0.61–1.24)
GFR, Estimated: 32 mL/min — ABNORMAL LOW (ref >60)
Glucose, Bld: 148 mg/dL — ABNORMAL HIGH (ref 70–99)
Potassium: 4.1 mmol/L (ref 3.5–5.1)
Sodium: 137 mmol/L (ref 135–145)

## 2024-07-22 LAB — PATHOLOGIST SMEAR REVIEW

## 2024-07-22 LAB — CBC
HCT: 27.1 % — ABNORMAL LOW (ref 39.0–52.0)
Hemoglobin: 8.7 g/dL — ABNORMAL LOW (ref 13.0–17.0)
MCH: 31.2 pg (ref 26.0–34.0)
MCHC: 32.1 g/dL (ref 30.0–36.0)
MCV: 97.1 fL (ref 80.0–100.0)
Platelets: 158 K/uL (ref 150–400)
RBC: 2.79 MIL/uL — ABNORMAL LOW (ref 4.22–5.81)
RDW: 18.3 % — ABNORMAL HIGH (ref 11.5–15.5)
WBC: 5.7 K/uL (ref 4.0–10.5)
nRBC: 0 % (ref 0.0–0.2)

## 2024-07-22 LAB — COMP PANEL: LEUKEMIA/LYMPHOMA

## 2024-07-22 LAB — COLD AGGLUTININ TITER: Cold Agglutinin Titer: NEGATIVE

## 2024-07-22 LAB — RETICULOCYTES
Immature Retic Fract: 30.4 % — ABNORMAL HIGH (ref 2.3–15.9)
RBC.: 2.73 MIL/uL — ABNORMAL LOW (ref 4.22–5.81)
Retic Count, Absolute: 137.9 K/uL (ref 19.0–186.0)
Retic Ct Pct: 5.1 % — ABNORMAL HIGH (ref 0.4–3.1)

## 2024-07-22 MED ORDER — FUROSEMIDE 10 MG/ML IJ SOLN
60.0000 mg | Freq: Two times a day (BID) | INTRAMUSCULAR | Status: DC
  Administered 2024-07-22: 60 mg via INTRAVENOUS
  Filled 2024-07-22: qty 6

## 2024-07-22 MED ORDER — TORSEMIDE 20 MG PO TABS
40.0000 mg | ORAL_TABLET | Freq: Every day | ORAL | Status: DC
  Administered 2024-07-23: 40 mg via ORAL
  Filled 2024-07-22: qty 2

## 2024-07-22 MED ORDER — TORSEMIDE 20 MG PO TABS: 40.0000 mg | ORAL_TABLET | Freq: Every day | ORAL | Status: DC

## 2024-07-22 NOTE — Evaluation (Signed)
 Occupational Therapy Evaluation Patient Details Name: [name] MRN: [id number] DOB: [birth year] Today's Date: 07/22/2024   History of Present Illness   Pt is a 78 y.o. 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 MD assessment includes: acute on chronic heart failure with hypoxia, possible fluid overload secondary to blood transfusions, recurrent pleural effusion, pulm HTN, mod to severe Jerry/TR.   Clinical Impressions [name] was seen for OT evaluation this date. Prior to hospital admission, pt was IND. Pt lives with spouse. Pt currently MOD I for ADL t/f ~50 ft using 2L [place] as needed.  Pt educated in energy conservation strategies including pursed lip breathing, activity pacing, home/routines modifications, work simplification, AE/DME, prioritizing of meaningful occupations, and falls prevention. All education complete, will sign off. Upon hospital discharge, recommend no OT follow up, may benefit from cardiopulmonary rehab.  SaO2 on room air at rest = 94% SaO2 on room air while ambulating = 85% SaO2 on 2 liters of O2 while ambulating = 93%    If plan is discharge home, recommend the following:   Help with stairs or ramp for entrance     Functional Status Assessment   Patient has had a recent decline in their functional status and demonstrates the ability to make significant improvements in function in a reasonable and predictable amount of time.     Equipment Recommendations   None recommended by OT     Recommendations for Other Services         Precautions/Restrictions    Precautions Precautions: None Restrictions Weight Bearing Restrictions Per Provider Order: No     Mobility Bed Mobility               General bed mobility comments: not tested    Transfers Overall transfer level: Independent                        Balance Overall balance assessment: Mild deficits observed, not formally tested                                         ADL either performed or assessed with clinical judgement   ADL Overall ADL's : Needs assistance/impaired                                       General ADL Comments: MOD I for ADL t/f ~50 ft using 2L [place] as needed.      Pertinent Vitals/Pain Pain Assessment Pain Assessment: Faces Faces Pain Scale: Hurts little more Pain Location: L knee Pain Descriptors / Indicators: Grimacing, Discomfort Pain Intervention(s): Limited activity within patient's tolerance, Repositioned     Extremity/Trunk Assessment Upper Extremity Assessment Upper Extremity Assessment: Overall WFL for tasks assessed   Lower Extremity Assessment Lower Extremity Assessment: Generalized weakness       Communication Communication Communication: No apparent difficulties   Cognition Arousal: Alert Behavior During Therapy: WFL for tasks assessed/performed Cognition: No apparent impairments  Following commands: Intact       Cueing  General Comments   Cueing Techniques: Verbal cues      Exercises     Shoulder Instructions      Home Living Family/patient expects to be discharged to:: Private residence Living Arrangements: Spouse/significant other Available Help at Discharge: Family Type of Home: House Home Access: Stairs to enter Secretary/administrator of Steps: 3 Entrance Stairs-Rails: Right;Left;Can reach both Home Layout: One level     Bathroom Shower/Tub: Walk-in shower         Home Equipment: Cane - single Social Worker (2 wheels)          Prior Functioning/Environment Prior Level of Function : Independent/Modified Independent;Driving                    OT Problem List: Decreased activity tolerance        OT Goals(Current goals can be found in the care plan section)   Acute Rehab OT Goals Patient Stated Goal: to go home OT Goal Formulation: With patient Time For Goal Achievement: 07/22/24 Potential to Achieve Goals: Good   AM-PAC OT 6 Clicks Daily Activity     Outcome Measure Help from another person eating meals?: None Help from another person taking care of personal grooming?: None Help from another person toileting, which includes using toliet, bedpan, or urinal?: None Help from another person bathing (including washing, rinsing, drying)?: A Little Help from another person to put on and taking off regular upper body clothing?: None Help from another person to put on and taking off regular lower body clothing?: None 6 Click Score: 23   End of Session Equipment Utilized During Treatment: Oxygen  Activity Tolerance: Patient tolerated treatment well Patient left: in chair;with call bell/phone within reach  OT Visit Diagnosis: Other abnormalities of gait and mobility (R26.89)                Time: 1116-1130 OT Time Calculation (min): 14 min Charges:  OT General Charges $OT Visit: 1 Visit OT Evaluation $OT Eval Low Complexity: 1 Low  [name], M.S. OTR/L  07/22/24, 1:02 PM  ascom [phone]

## 2024-07-22 NOTE — Progress Notes (Signed)
 [facility] CARDIOLOGY PROGRESS NOTE   Patient ID: [name] MRN: [id number] DOB/AGE: [birth year] 78 y.o.  Admit date: 07/18/2024 Referring Physician Dr. Delayne Solian  Primary Physician [name], MD  Primary Cardiologist Dr. Ammon Reason for Consultation AoCHF  HPI: [name] is a 78 y.o. male with a past medical history of chronic HFpEF, persistent atrial fibrillation, aortic stenosis s/p TAVR (01/2024), coronary artery disease s/p CABG x3, multiple coronary stents, most recent stent to ostial SVG in-stent restenosis (2021), hypertension, hyperlipidemia, OSA, CKD, diabetes who presented to the ED on 07/18/2024 for SOB. Cardiology was consulted for further evaluation.   Interval History:  -Patient seen and examined this AM, feeling well overall. Appears comfortable, still with some SOB when he moves around. -Cr up on AM labs so will transition to PO diuresis.  Review of systems complete and found to be negative unless listed above   Vitals:   07/21/24 2023 07/22/24 0025 07/22/24 0531 07/22/24 0619  BP: 137/74 (!) 166/69 (!) 152/92 (!) 140/80  Pulse: (!) 54 68 61   Resp: 20 20 20    Temp: 97.6 F (36.4 C) 97.6 F (36.4 C) 97.8 F (36.6 C)   TempSrc:      SpO2: 93% 93% 100%   Weight:   86.2 kg   Height:         Intake/Output Summary (Last 24 hours) at 07/22/2024 0853 Last data filed at 07/22/2024 0025 Gross per 24 hour  Intake 440 ml  Output 2500 ml  Net -2060 ml     PHYSICAL EXAM General: Chronically ill appearing male, well nourished, in no acute distress. HEENT: Normocephalic and atraumatic. Neck: No JVD.  Lungs: Normal respiratory effort on 2L Alicia. Clear bilaterally to auscultation. No wheezes, crackles, rhonchi.  Heart: Irregularly irregular. Normal S1 and S2 without gallops or murmurs. Radial & DP pulses 2+ bilaterally. Abdomen: Non-distended appearing.  Msk: Normal strength and tone for age. Extremities: No clubbing, cyanosis or edema.    Neuro: Alert and oriented X 3. Psych: Mood appropriate, affect congruent.    LABS: Basic Metabolic Panel: Recent Labs    07/21/24 0400 07/22/24 0449  NA 138 137  K 4.2 4.1  CL 99 97*  CO2 31 31  GLUCOSE 154* 148*  BUN 40* 39*  CREATININE 1.89* 2.09*  CALCIUM  9.5 10.0   Liver Function Tests: Recent Labs    07/20/24 1042  AST 30  ALT 10  ALKPHOS 110  BILITOT 1.1  PROT 7.0  ALBUMIN 3.6   No results for input(s): LIPASE, AMYLASE in the last 72 hours. CBC: Recent Labs    07/21/24 0400 07/22/24 0449  WBC 5.4 5.7  HGB 8.4* 8.7*  HCT 26.9* 27.1*  MCV 100.7* 97.1  PLT 127* 158   Cardiac Enzymes: No results for input(s): CKTOTAL, CKMB, CKMBINDEX, TROPONINIHS in the last 72 hours. BNP: No results for input(s): BNP in the last 72 hours. D-Dimer: No results for input(s): DDIMER in the last 72 hours. Hemoglobin A1C: No results for input(s): HGBA1C in the last 72 hours. Fasting Lipid Panel: No results for input(s): CHOL, HDL, LDLCALC, TRIG, CHOLHDL, LDLDIRECT in the last 72 hours. Thyroid  Function Tests: No results for input(s): TSH, T4TOTAL, T3FREE, THYROIDAB in the last 72 hours.  Invalid input(s): FREET3 Anemia Panel: Recent Labs    07/22/24 0449  RETICCTPCT 5.1*    CT CHEST ABDOMEN PELVIS WO CONTRAST Result Date: 07/20/2024 EXAM: CT CHEST, ABDOMEN AND PELVIS WITHOUT CONTRAST 07/20/2024 06:52:00 PM TECHNIQUE: CT of  the chest, abdomen and pelvis was performed without the administration of intravenous contrast. Multiplanar reformatted images are provided for review. Automated exposure control, iterative reconstruction, and/or weight based adjustment of the mA/kV was utilized to reduce the radiation dose to as low as reasonably achievable. COMPARISON: 08/20/2023 CLINICAL HISTORY: LDH elevation. Oncology wanted to see if cancerous process. Okay for oral contrast but not intravenous. FINDINGS: CHEST: MEDIASTINUM AND LYMPH  NODES: Status post transcatheter aortic valve replacement and coronary artery bypass grafting. Mild cardiomegaly. No pericardial effusion. The central pulmonary arteries are enlarged in keeping with changes of pulmonary arterial hypertension. Mild atherosclerotic calcification within the thoracic aorta. No aortic aneurysm. There is progressive pathologic mediastinal adenopathy. By example, previously measured left prevascular and precarinal lymph nodes now measure 12 mm (previously 10 mm) and 20 mm (previously 10 mm) in short axis diameter. The findings suggest either a chronic inflammatory or low-grade lymphoproliferative process. Stable 2.3 cm right thyroid  nodule, better assessed on thyroid  sonogram of 10/12/2020. No follow up imaging is recommended. LUNGS AND PLEURA: Small to moderate bilateral pleural effusions, right greater than left, appears similar to prior examination though there is new pleural thickening bilaterally as well as probable septation noted at the right basilar pleural space which is nonspecific in the setting of repeated thoracentesis. Small focus of pleural gas on the right may relate to recent thoracentesis. No focal consolidation or pulmonary edema. No pneumothorax. ABDOMEN AND PELVIS: LIVER: The liver is unremarkable. GALLBLADDER AND BILE DUCTS: Cholelithiasis without superimposed pericholecystic inflammatory change. No intra or extrahepatic biliary ductal dilation. SPLEEN: No acute abnormality. PANCREAS: No acute abnormality. ADRENAL GLANDS: No acute abnormality. KIDNEYS, URETERS AND BLADDER: Simple exophytic cortical cysts are seen within the kidneys bilaterally for which no follow up imaging is recommended. Of a hyperdense exophytic cortical cyst arises anteriorly but little lower pole of the right kidney but is unchanged from prior examination and was better characterized as a simple cyst on that exam. Per consensus, no follow-up is needed for simple Bosniak type 1 and 2 renal cysts,  unless the patient has a malignancy history or risk factors. 7 mm nonobstructing calculus within the lower pole of the left kidney. The kidneys are otherwise unremarkable. No stones in the ureters. No hydronephrosis. No perinephric or periureteral stranding. Urinary bladder is unremarkable. GI AND BOWEL: Moderate stool burden within the distal colon and rectum without evidence of obstruction. The stomach, small bowel, and large bowel are otherwise unremarkable. Appendix absent. REPRODUCTIVE ORGANS: No acute abnormality. PERITONEUM AND RETROPERITONEUM: No ascites. No free air. VASCULATURE: Aorta is normal in caliber. Extensive aortic iliac atherosclerotic calcification. ABDOMINAL AND PELVIS LYMPH NODES: Pathologic aortic cable adenopathy with a single lymph node measuring 15 mm in short axis diameter is stable since prior examination, nonspecific. No additional pathologic adenopathy within the abdomen and pelvis. REPRODUCTIVE ORGANS: No acute abnormality. BONES AND SOFT TISSUES: Osseous structures are age appropriate. No acute bone abnormality. No lytic or blastic bone lesion. No focal soft tissue abnormality. IMPRESSION: 1. Progressive pathologic mediastinal adenopathy, suggestive of a chronic inflammatory or low-grade lymphoproliferative process. 2. Small to moderate bilateral pleural effusions, right greater than left, with new bilateral pleural thickening and probable right basilar pleural space septation; small right pleural gas likely post-thoracentesis. 3. Stable pathologic aortocaval adenopathy measuring 15 mm in short axis, nonspecific. 4. Stable 2.3 cm right thyroid  nodule; no follow-up imaging is recommended. 5. Cholelithiasis without acute inflammatory change and without biliary ductal dilation. 6. Moderate stool burden in the distal colon and rectum  without obstruction. 7. RAF score: Aortic Atherosclerosis (ICD10-I70.0). Electronically signed by: Dorethia Molt MD 07/20/2024 08:42 PM EST RP Workstation:  [id number]   DG Chest Port 1 View Result Date: 07/20/2024 CLINICAL DATA:  Status post right thoracentesis. EXAM: PORTABLE CHEST 1 VIEW COMPARISON:  07/18/2024 FINDINGS: Small to moderate-sized right pleural effusion, decreased in size. No pneumothorax. No significant change in right basilar atelectasis and possible pneumonia. Stable small left pleural effusion. Increased linear and patchy density at the left lung base. Atheromatous aortic arch calcifications. Diffuse osteopenia. IMPRESSION: 1. Small to moderate-sized right pleural effusion, decreased in size following thoracentesis. 2. No pneumothorax. 3. No significant change in right basilar atelectasis and possible pneumonia. 4. Increased left basilar atelectasis and possible pneumonia. 5. Stable small left pleural effusion. Electronically Signed   By: Elspeth Bathe M.D.   On: 07/20/2024 14:34   US  THORACENTESIS ASP PLEURAL SPACE W/IMG GUIDE Result Date: 07/20/2024 INDICATION: 78 year old male with an extensive cardiac history, including heart failure and aortic valve replacement in 01/2024, and severe pulmonary hypertension with recurrent right-sided pleural effusions. Request for diagnostic and therapeutic thoracentesis. EXAM: ULTRASOUND GUIDED DIAGNOSTIC AND THERAPEUTIC, RIGHT-SIDED THORACENTESIS MEDICATIONS: 1% lidocaine , 8 mL. COMPLICATIONS: None immediate. PROCEDURE: An ultrasound guided thoracentesis was thoroughly discussed with the patient and questions answered. The benefits, risks, alternatives and complications were also discussed. The patient understands and wishes to proceed with the procedure. Written consent was obtained. Ultrasound was performed and was notable for several loculations throughout the right chest. An adequate pocket of fluid was identified in the right chest. The area was then prepped and draped in the normal sterile fashion. 1% Lidocaine  was used for local anesthesia. Under ultrasound guidance a 6 Fr Safe-T-Centesis catheter  was introduced. Thoracentesis was performed. Unfortunately, due to the septations, only a small amount of fluid was able to be drained. Residual fluid noted to the right lateral chest on post procedure ultrasound. The catheter was removed and a dressing applied. FINDINGS: A total of approximately 450 mL of amber fluid was removed. Samples were sent to the laboratory as requested by the clinical team. IMPRESSION: Successful ultrasound guided right-sided thoracentesis yielding 450 mL of pleural fluid. Procedure performed by: Sherrilee Bal, PA-C under the supervision of Dr. ONEIDA Specking. Electronically Signed   By: CHRISTELLA.  Shick M.D.   On: 07/20/2024 11:14     ECHO 02/2024: 1. Left ventricular ejection fraction, by estimation, is 60 to 65%. The left ventricle has normal function. The left ventricle has no regional wall motion abnormalities. Left ventricular diastolic parameters were normal.   2. Right ventricular systolic function is normal. The right ventricular size is normal.   3. The mitral valve is normal in structure. Moderate to severe mitral  valve regurgitation. No evidence of mitral stenosis.   4. Tricuspid valve regurgitation is mild to moderate.   5. The aortic valve is normal in structure. Aortic valve regurgitation is  mild. No aortic stenosis is present.   6. The inferior vena cava is normal in size with greater than 50%  respiratory variability, suggesting right atrial pressure of 3 mmHg.   [facility] 03/2024: NORMAL LEFT VENTRICULAR SYSTOLIC FUNCTION WITH SEVERE LVH  ESTIMATED EF: >55%, CALC EF(3D): 55%  ELEVATED LA PRESSURES WITH DIASTOLIC DYSFUNCTION (GRADE 3)  NORMAL RIGHT VENTRICULAR SYSTOLIC FUNCTION  VALVULAR REGURGITATION: MILD AR, MILD MR, TRIVIAL PR, MILD TR  ESTIMATED RVSP: 85 mmHg (ABNORMAL)  VALVULAR STENOSIS: TRANSCATHETER BIOPROSTHETIC, No MS, No PS, No TS    TELEMETRY (personally reviewed): atrial fibrillation rate  60-70s  EKG (personally reviewed): atrial fibrillation rate  66 bpm  DATA reviewed by me 07/22/24: last 24h vitals tele labs imaging I/O, hospitalist progress note  Principal Problem:   Acute on chronic heart failure with preserved ejection fraction (HCC) Active Problems:   Essential hypertension   OSA (obstructive sleep apnea)   CAD (coronary artery disease), s/p CABG x 3   Paroxysmal atrial fibrillation (HCC)   Acute hypoxic respiratory failure (HCC)   Severe mitral regurgitation   Stage 3b chronic kidney disease (HCC)   Recurrent pleural effusion on right   Anemia in chronic kidney disease   S/P TAVR (transcatheter aortic valve replacement)   Moderate to severe mitral and tricuspid regurgitation   Possible fluid overload due to blood transfusion (11/20. 11/22, 07/16/2024)   Lymphadenopathy    ASSESSMENT AND PLAN: OLYVER HAWES is a 78 y.o. male with a past medical history of chronic HFpEF, persistent atrial fibrillation, aortic stenosis s/p TAVR (01/2024), coronary artery disease s/p CABG x3, multiple coronary stents, most recent stent to ostial SVG in-stent restenosis (2021), hypertension, hyperlipidemia, OSA, CKD, diabetes who presented to the ED on 07/18/2024 for SOB. Cardiology was consulted for further evaluation.   # Acute hypoxic respiratory failure # Acute on chronic HFpEF # Bilateral pleural effusions # Coronary artery disease s/p CABG # Demand ischemia # Aortic stenosis s/p TAVR 01/2024 Patient presented to ED for evaluation of SOB.  BNP elevated at 13,000.  Trops mild and flat.  Chest x-ray with bilateral pleural effusions. S/p thoracentesis with 450 mL output 07/20/2024. S/p transfusion 07/20/2024. - Transition to PO torsemide  at increased dose of 40 mg daily.  - Plavix  held given anemia. Continue home atorvastatin  80 mg daily, aspirin  81 mg daily. - Home amlodipine  held. Continue home Imdur  120 mg daily, carvedilol  3.125 mg twice daily, jardiance  10 mg daily.  - Mild troponin elevation most consistent with demand/supply  mismatch and not ACS.  - Patient with history of recurrent pleural effusions, will likely require thoracentesis during admission.  This patient's case was discussed and created with Dr. Ammon and he is in agreement.  Signed:  Danita Bloch, PA-C  07/22/2024, 8:53 AM [facility]

## 2024-07-22 NOTE — Progress Notes (Signed)
 PROGRESS NOTE    [name]   [name] DOB: 1946/05/22  DOA: 07/18/2024 Date of Service: 07/22/24 which is hospital day 3  PCP: [name], MD    Hospital course / significant events:   HPI: 78 y.o. 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  He presented with 1 day of intermittent chest pain, shortness of breath and fatigue.  He denied cough, fever or chills or lower extremity pain.  He took a nitroglycerin  at home with some relief in chest pain.  Patient is followed by hematology and states he has been getting as needed blood transfusions for the past year and he usually feels much better after his transfusions but after the last 3 transfusions this past month he has felt more winded.  12/06: to ED.Tachypnea to 22, O2 sat initially 91% on room air, desatted to 82 while in the ED requiring 2 L to maintain sats in the high 90s.  Labs notable for troponin 75--69 and proBNP 13,000.  Hemoglobin 8.8, up from 7.9 about 4 days prior.  Creatinine 1.9 which is about his baseline for CKD 3B. EKG showed A-fib at 68 with RBBB. Chest x-ray showed stable bibasilar consolidation and effusions right greater than left and a stable cardiac silhouette. Treated with IV Lasix . Admission requested. Admitted to hospitalist service w/ HFpEF and associated acute hypoxic respiratory failure.  12/07.  Lasix  to 60 mg IV twice daily.  3.5 L O2 [place].  Hgb down to 7.2.  (+)recurrent pleural effusion R>L   12/08.  Hgb stable 7.2.  LDH high.  Will add on haptoglobin.  Tbili 1.1.  Case discussed with oncology to get CT scan of the chest abdomen pelvis without contrast.  Continue IV Lasix . 1 unit PRBC.  R Thoracentesis removing 450 mL  12/09.  4L O2 .  CT scan of the chest abdomen pelvis shows progressive mediastinal adenopathy suggestive of chronic inflammatory or low-grade lymphoproliferative process.  Small to bilateral pleural effusions right greater than left.  Stable aortocaval adenopathy measuring 15 mm in short axis nonspecific, moderate stool burden 12/10: transition to po diuretics - torsemide  at increased dose of 40 mg daily.      Consultants:  Cardiology  Medical Oncology  Procedures/Surgeries: 12/08: R thoracentesis       ASSESSMENT & PLAN:   Acute on chronic heart failure with preserved ejection fraction  Patient has a normal EF 60 to 65% but has severe mitral regurgitation and severe pulmonary hypertension.   Lasix  to 60 mg IV twice daily d/c, starting po torsmide 40 mg dialy  Cardiology following.   on Imdur , Jardiance , low-dose Coreg    Acute hypoxic respiratory failure  Patient on 4 L currently.  Had a pulse ox of 83% on room air.  Patient does not wear oxygen at home.   He is s/p R thoracentesis  may need ongoing O2 at home - ordered, see PT note for O2 saturation testing   Recurrent pleural effusion on right Thoracentesis drew off 450 mL underneath the right lung on 12/8 cytology and flow cytometry  Paroxysmal atrial fibrillation  Rate controlled Continue carvedilol  and aspirin  not on anticoagulation due to recurrent GIB / anemia requiring transfusions   CAD (coronary artery disease), s/p CABG x 3 Essential HTN HLD Had chest pain that was resolved with nitroglycerin  Troponin slightly elevated but downtrending at 75-> 69 Continue aspirin , carvedilol , atorvastatin , Imdur , nitroglycerin  sublingual as needed chest pain   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  With LDH being high, CT scan of the chest, abdomen and pelvis showing progressive mediastinal adenopathy suggesting of chronic inflammatory or low-grade lymphoproliferative process.   Case discussed with Dr. Melanee oncology and she will set up for PET CT scan as outpatient.   Await cytology and flow cytometry from pleural fluid.   Other testing as per oncology - follow outpatient    Lymphadenopathy High LDH and lymphadenopathy seen in the mediastinum and aortocaval area.  Case discussed with Dr. Melanee oncology and she will have to order a PET CT scan as outpatient.  Will await for flow cytometry and cytology report from pleural fluid. Follow w/ oncology    Stage 3b chronic kidney disease  High risk AKI/cardiorenal, esp on diuresis.   Creatinine 1.93 with a GFR of 35 Monitor BMP   OSA (obstructive sleep apnea) Patient is CPAP intolerant        overweight based on BMI: Body mass index is 25.42 kg/m.[name] Significantly low or high BMI is associated with higher medical risk.  Underweight - under 18  overweight - 25 to 29 obese - 30 or more Class 1 obesity: BMI of 30.0 to 34 Class 2 obesity: BMI of 35.0 to 39 Class 3 obesity: BMI of 40.0 to 49 Super Morbid Obesity: BMI 50-59 Super-super Morbid Obesity: BMI 60+ Healthy nutrition and physical activity advised as adjunct to other disease management and risk reduction treatments    DVT prophylaxis: holding rx ppx d/t anemia  IV fluids: no continuous IV fluids  Nutrition: cardaic Central lines / other devices: none  Code Status: FULL CODE ACP documentation reviewed:  none on file in [facility]  TOC needs: O2 at home  Medical barriers to dispo: transition po diuretics. Expected medical readiness for discharge 1-2 days / cardiology clearance .              Subjective / Brief ROS:  Patient reports feelin gpretty good  today but still SOB on exertion Denies CP/SOB at rest   Pain controlled.  Denies new weakness.  Tolerating diet.  Reports no concerns w/ urination/defecation.   Family Communication: none at this time     Objective Findings:  Vitals:   07/22/24 0619 07/22/24 0903 07/22/24 1139 07/22/24 1726  BP: (!) 140/80 (!) 146/61 (!) 130/58 (!) 141/56  Pulse:  (!) 56 (!) 53 (!) 59  Resp:  20 18 16   Temp:  97.6 F (36.4 C) (!) 97.4 F (36.3 C) (!) 97.5 F (36.4 C)  TempSrc:   Oral   SpO2:  97% 98% 98%  Weight:      Height:        Intake/Output Summary (Last 24 hours) at 07/22/2024 1758 Last data filed at 07/22/2024 1410 Gross per 24 hour  Intake 320 ml  Output 1350 ml  Net -1030 ml   Filed Weights   07/20/24 0500 07/21/24 0500 07/22/24 0531  Weight: 88 kg 86.6 kg 86.2 kg    Examination:  Physical Exam  Cardiovascular:     Rate and Rhythm: Normal rate and regular rhythm.     Heart sounds: Murmur heard.  Pulmonary:     Effort: Pulmonary effort is normal.     Breath sounds: Examination of the right-lower field reveals decreased breath sounds. Examination of the left-lower field reveals decreased breath sounds. Decreased breath sounds present.  Neurological:     Mental Status: He is alert and oriented to person, place, and time.  Psychiatric:        Mood and Affect: Mood normal.        Behavior: Behavior normal.          Scheduled Medications:   aspirin  EC  81 mg Oral Daily   atorvastatin   80 mg Oral Daily   carvedilol   3.125 mg Oral BID WC   cyanocobalamin   1,000 mcg Oral Daily   empagliflozin   10 mg Oral QAC breakfast   isosorbide  mononitrate  120 mg Oral Daily   pantoprazole   40 mg Oral BID   [START ON 07/23/2024] torsemide   40 mg Oral Daily    Continuous Infusions:   PRN Medications:  acetaminophen  **OR** acetaminophen , HYDROcodone -acetaminophen , lactulose , nitroGLYCERIN , ondansetron  **OR** ondansetron  (ZOFRAN ) IV  Antimicrobials from admission:   Anti-infectives (From admission, onward)    None           Data Reviewed:  I have personally reviewed the following...  CBC: Recent Labs  Lab 07/18/24 1422 07/19/24 0547 07/20/24 0425 07/21/24 0400 07/22/24 0449  WBC 6.8 6.3 5.4 5.4 5.7  HGB 8.8* 7.2* 7.2* 8.4* 8.7*  HCT 28.2* 22.8* 23.5* 26.9* 27.1*  MCV 98.9 98.7 101.3* 100.7* 97.1  PLT 155 133* 125* 127* 158   Basic Metabolic Panel: Recent Labs  Lab 07/18/24 1422 07/19/24 0547 07/20/24 0425 07/21/24 0400 07/22/24 0449  NA 140 141 140 138 137  K 4.6 4.3 4.1 4.2 4.1  CL 103 106 103 99 97*  CO2 26 29 30 31 31   GLUCOSE 162* 162* 144* 154* 148*  BUN 40* 41* 41* 40* 39*  CREATININE 1.90* 1.93* 1.89* 1.89* 2.09*  CALCIUM  10.6* 10.1 10.1 9.5 10.0   GFR: Estimated Creatinine Clearance: 33.5 mL/min (A) (by C-G formula based on SCr of 2.09 mg/dL (H)). Liver Function Tests: Recent Labs  Lab 07/20/24 1042  AST 30  ALT 10  ALKPHOS 110  BILITOT 1.1  PROT 7.0  ALBUMIN 3.6   No results for input(s): LIPASE, AMYLASE in the last 168 hours. No results for input(s): AMMONIA in the last 168 hours. Coagulation Profile: No results for input(s): INR, PROTIME in the last 168 hours. Cardiac Enzymes: No results for input(s): CKTOTAL, CKMB, CKMBINDEX, TROPONINI in the last 168 hours. BNP (last 3 results) Recent Labs    07/01/24 1220 07/18/24 1707  PROBNP 12,167.0* 13,853.0*   HbA1C: No results for input(s): HGBA1C in the last 72 hours. CBG: No results for input(s): GLUCAP in the last 168 hours. Lipid Profile: No results for input(s): CHOL, HDL, LDLCALC, TRIG, CHOLHDL, LDLDIRECT in the last 72 hours. Thyroid  Function Tests: No results for input(s): TSH, T4TOTAL, FREET4, T3FREE, THYROIDAB in the last 72 hours. Anemia Panel: Recent Labs    07/22/24 0449  RETICCTPCT 5.1*   Most Recent Urinalysis On File:     Component Value Date/Time   COLORURINE YELLOW (A)  03/02/2024 1617   APPEARANCEUR CLEAR (A) 03/02/2024 1617   LABSPEC 1.014 03/02/2024 1617   PHURINE 5.0 03/02/2024 1617   GLUCOSEU >=500 (A) 03/02/2024 1617   HGBUR NEGATIVE  03/02/2024 1617   BILIRUBINUR NEGATIVE 03/02/2024 1617   KETONESUR NEGATIVE 03/02/2024 1617   PROTEINUR 100 (A) 03/02/2024 1617   NITRITE NEGATIVE 03/02/2024 1617   LEUKOCYTESUR NEGATIVE 03/02/2024 1617   Sepsis Labs: @LABRCNTIP (procalcitonin:4,lacticidven:4) Microbiology: Recent Results (from the past 240 hours)  Body fluid culture w Gram Stain     Status: None (Preliminary result)   Collection Time: 07/20/24 10:06 AM   Specimen: PATH Cytology Pleural fluid  Result Value Ref Range Status   Specimen Description   Final    PLEURAL Performed at [facility], [address], [place], [place] [address]    Special Requests   Final    NONE Performed at [facility], [address] [place], [place] [address]    Gram Stain NO WBC SEEN NO ORGANISMS SEEN   Final   Culture   Final    NO GROWTH 2 DAYS Performed at [facility], [address] [place], [place] [address]    Report Status PENDING  Incomplete      Radiology Studies last 3 days: CT CHEST ABDOMEN PELVIS WO CONTRAST Result Date: 07/20/2024 EXAM: CT CHEST, ABDOMEN AND PELVIS WITHOUT CONTRAST 07/20/2024 06:52:00 PM TECHNIQUE: CT of the chest, abdomen and pelvis was performed without the administration of intravenous contrast. Multiplanar reformatted images are provided for review. Automated exposure control, iterative reconstruction, and/or weight based adjustment of the mA/kV was utilized to reduce the radiation dose to as low as reasonably achievable. COMPARISON: 08/20/2023 CLINICAL HISTORY: LDH elevation. Oncology wanted to see if cancerous process. Okay for oral contrast but not intravenous. FINDINGS: CHEST: MEDIASTINUM AND LYMPH NODES: Status post transcatheter aortic valve replacement and coronary artery bypass grafting. Mild  cardiomegaly. No pericardial effusion. The central pulmonary arteries are enlarged in keeping with changes of pulmonary arterial hypertension. Mild atherosclerotic calcification within the thoracic aorta. No aortic aneurysm. There is progressive pathologic mediastinal adenopathy. By example, previously measured left prevascular and precarinal lymph nodes now measure 12 mm (previously 10 mm) and 20 mm (previously 10 mm) in short axis diameter. The findings suggest either a chronic inflammatory or low-grade lymphoproliferative process. Stable 2.3 cm right thyroid  nodule, better assessed on thyroid  sonogram of 10/12/2020. No follow up imaging is recommended. LUNGS AND PLEURA: Small to moderate bilateral pleural effusions, right greater than left, appears similar to prior examination though there is new pleural thickening bilaterally as well as probable septation noted at the right basilar pleural space which is nonspecific in the setting of repeated thoracentesis. Small focus of pleural gas on the right may relate to recent thoracentesis. No focal consolidation or pulmonary edema. No pneumothorax. ABDOMEN AND PELVIS: LIVER: The liver is unremarkable. GALLBLADDER AND BILE DUCTS: Cholelithiasis without superimposed pericholecystic inflammatory change. No intra or extrahepatic biliary ductal dilation. SPLEEN: No acute abnormality. PANCREAS: No acute abnormality. ADRENAL GLANDS: No acute abnormality. KIDNEYS, URETERS AND BLADDER: Simple exophytic cortical cysts are seen within the kidneys bilaterally for which no follow up imaging is recommended. Of a hyperdense exophytic cortical cyst arises anteriorly but little lower pole of the right kidney but is unchanged from prior examination and was better characterized as a simple cyst on that exam. Per consensus, no follow-up is needed for simple Bosniak type 1 and 2 renal cysts, unless the patient has a malignancy history or risk factors. 7 mm nonobstructing calculus within the  lower pole of the left kidney. The kidneys are otherwise unremarkable. No stones in the ureters. No hydronephrosis. No perinephric or periureteral stranding. Urinary  bladder is unremarkable. GI AND BOWEL: Moderate stool burden within the distal colon and rectum without evidence of obstruction. The stomach, small bowel, and large bowel are otherwise unremarkable. Appendix absent. REPRODUCTIVE ORGANS: No acute abnormality. PERITONEUM AND RETROPERITONEUM: No ascites. No free air. VASCULATURE: Aorta is normal in caliber. Extensive aortic iliac atherosclerotic calcification. ABDOMINAL AND PELVIS LYMPH NODES: Pathologic aortic cable adenopathy with a single lymph node measuring 15 mm in short axis diameter is stable since prior examination, nonspecific. No additional pathologic adenopathy within the abdomen and pelvis. REPRODUCTIVE ORGANS: No acute abnormality. BONES AND SOFT TISSUES: Osseous structures are age appropriate. No acute bone abnormality. No lytic or blastic bone lesion. No focal soft tissue abnormality. IMPRESSION: 1. Progressive pathologic mediastinal adenopathy, suggestive of a chronic inflammatory or low-grade lymphoproliferative process. 2. Small to moderate bilateral pleural effusions, right greater than left, with new bilateral pleural thickening and probable right basilar pleural space septation; small right pleural gas likely post-thoracentesis. 3. Stable pathologic aortocaval adenopathy measuring 15 mm in short axis, nonspecific. 4. Stable 2.3 cm right thyroid  nodule; no follow-up imaging is recommended. 5. Cholelithiasis without acute inflammatory change and without biliary ductal dilation. 6. Moderate stool burden in the distal colon and rectum without obstruction. 7. RAF score: Aortic Atherosclerosis (ICD10-I70.0). Electronically signed by: Dorethia Molt MD 07/20/2024 08:42 PM EST RP Workstation: [id number]   DG Chest Port 1 View Result Date: 07/20/2024 CLINICAL DATA:  Status post right  thoracentesis. EXAM: PORTABLE CHEST 1 VIEW COMPARISON:  07/18/2024 FINDINGS: Small to moderate-sized right pleural effusion, decreased in size. No pneumothorax. No significant change in right basilar atelectasis and possible pneumonia. Stable small left pleural effusion. Increased linear and patchy density at the left lung base. Atheromatous aortic arch calcifications. Diffuse osteopenia. IMPRESSION: 1. Small to moderate-sized right pleural effusion, decreased in size following thoracentesis. 2. No pneumothorax. 3. No significant change in right basilar atelectasis and possible pneumonia. 4. Increased left basilar atelectasis and possible pneumonia. 5. Stable small left pleural effusion. Electronically Signed   By: Elspeth Bathe M.D.   On: 07/20/2024 14:34   US  THORACENTESIS ASP PLEURAL SPACE W/IMG GUIDE Result Date: 07/20/2024 INDICATION: 78 year old male with an extensive cardiac history, including heart failure and aortic valve replacement in 01/2024, and severe pulmonary hypertension with recurrent right-sided pleural effusions. Request for diagnostic and therapeutic thoracentesis. EXAM: ULTRASOUND GUIDED DIAGNOSTIC AND THERAPEUTIC, RIGHT-SIDED THORACENTESIS MEDICATIONS: 1% lidocaine , 8 mL. COMPLICATIONS: None immediate. PROCEDURE: An ultrasound guided thoracentesis was thoroughly discussed with the patient and questions answered. The benefits, risks, alternatives and complications were also discussed. The patient understands and wishes to proceed with the procedure. Written consent was obtained. Ultrasound was performed and was notable for several loculations throughout the right chest. An adequate pocket of fluid was identified in the right chest. The area was then prepped and draped in the normal sterile fashion. 1% Lidocaine  was used for local anesthesia. Under ultrasound guidance a 6 Fr Safe-T-Centesis catheter was introduced. Thoracentesis was performed. Unfortunately, due to the septations, only a small  amount of fluid was able to be drained. Residual fluid noted to the right lateral chest on post procedure ultrasound. The catheter was removed and a dressing applied. FINDINGS: A total of approximately 450 mL of amber fluid was removed. Samples were sent to the laboratory as requested by the clinical team. IMPRESSION: Successful ultrasound guided right-sided thoracentesis yielding 450 mL of pleural fluid. Procedure performed by: Sherrilee Bal, PA-C under the supervision of Dr. ONEIDA Specking. Electronically Signed   By: CHRISTELLA.  Shick M.D.   On: 07/20/2024 11:14         Laneta Blunt, DO [facility] 07/22/2024, 5:58 PM    Dictation software may have been used to generate the above note. Typos may occur and escape review in typed/dictated notes. Please contact Dr Blunt directly for clarity if needed.  Staff may message me via secure chat in Epic  but this may not receive an immediate response,  please page me for urgent matters!  If 7PM-7AM, please contact night coverage [email or web address]

## 2024-07-23 ENCOUNTER — Encounter: Payer: Self-pay | Admitting: Oncology

## 2024-07-23 ENCOUNTER — Other Ambulatory Visit: Payer: Self-pay

## 2024-07-23 LAB — BASIC METABOLIC PANEL WITH GFR
Anion gap: 8 (ref 5–15)
BUN: 44 mg/dL — ABNORMAL HIGH (ref 8–23)
CO2: 32 mmol/L (ref 22–32)
Calcium: 9.7 mg/dL (ref 8.9–10.3)
Chloride: 97 mmol/L — ABNORMAL LOW (ref 98–111)
Creatinine, Ser: 2.11 mg/dL — ABNORMAL HIGH (ref 0.61–1.24)
GFR, Estimated: 32 mL/min — ABNORMAL LOW (ref >60)
Glucose, Bld: 172 mg/dL — ABNORMAL HIGH (ref 70–99)
Potassium: 4 mmol/L (ref 3.5–5.1)
Sodium: 137 mmol/L (ref 135–145)

## 2024-07-23 LAB — CULTURE, FUNGUS WITHOUT SMEAR

## 2024-07-23 LAB — PREPARE RBC (CROSSMATCH)

## 2024-07-23 LAB — BODY FLUID CULTURE W GRAM STAIN
Culture: NO GROWTH
Gram Stain: NONE SEEN

## 2024-07-23 LAB — CBC
HCT: 24.7 % — ABNORMAL LOW (ref 39.0–52.0)
Hemoglobin: 7.7 g/dL — ABNORMAL LOW (ref 13.0–17.0)
MCH: 31.3 pg (ref 26.0–34.0)
MCHC: 31.2 g/dL (ref 30.0–36.0)
MCV: 100.4 fL — ABNORMAL HIGH (ref 80.0–100.0)
Platelets: 134 K/uL — ABNORMAL LOW (ref 150–400)
RBC: 2.46 MIL/uL — ABNORMAL LOW (ref 4.22–5.81)
RDW: 18.1 % — ABNORMAL HIGH (ref 11.5–15.5)
WBC: 4.9 K/uL (ref 4.0–10.5)
nRBC: 0 % (ref 0.0–0.2)

## 2024-07-23 LAB — C3 COMPLEMENT: C3 Complement: 135 mg/dL (ref 82–167)

## 2024-07-23 MED ORDER — SODIUM CHLORIDE 0.9% IV SOLUTION: Freq: Once | INTRAVENOUS | Status: AC

## 2024-07-23 MED ORDER — TORSEMIDE 20 MG PO TABS
40.0000 mg | ORAL_TABLET | Freq: Every day | ORAL | 0 refills | Status: DC
  Filled 2024-07-23: qty 60, 30d supply, fill #0

## 2024-07-23 MED ORDER — AMLODIPINE BESYLATE 10 MG PO TABS
10.0000 mg | ORAL_TABLET | Freq: Every day | ORAL | Status: DC
  Administered 2024-07-23: 10 mg via ORAL
  Filled 2024-07-23: qty 1

## 2024-07-23 NOTE — Progress Notes (Signed)
 SaO2 on room air at rest = 94% SaO2 on room air while ambulating = 85% SaO2 on 2 liters of O2 while ambulating = 93%

## 2024-07-23 NOTE — Progress Notes (Signed)
 Physical Therapy Treatment Patient Details Name: [name] MRN: [id number] DOB: 1946-03-20 Today's Date: 07/23/2024   History of Present Illness Pt is a 78 y.o. 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 MD assessment includes: acute on chronic heart failure with hypoxia, possible fluid overload secondary to blood transfusions, recurrent pleural effusion, pulm HTN, mod to severe MR/TR.    PT Comments  Pt was sitting in chair on arrival and was somewhat willing to participate during the session, though needed some encouragement. Pt ambulated approx 59ft without AD and supervision for safety (see below). Pt had a slightly crouched gait, and bilat knee flexion, and L knee discomfort. Pt was educated on stair negotiation but declined practicing during this session prior to discharge. Pts SpO2 remained >90% with rest and activity and HR WNL throughout. Pt reports having good control over his current health situation with adequate support at home and generally disinterested with PT outside of the hospital; will maintain on caseload while in hospital, no further PT services needed upon discharge.    If plan is discharge home, recommend the following: Assistance with cooking/housework;Assist for transportation   Can travel by private vehicle        Equipment Recommendations  None recommended by PT    Recommendations for Other Services       Precautions / Restrictions Precautions Precautions: None Recall of Precautions/Restrictions: Intact Precaution/Restrictions Comments: monitor O2 Restrictions Weight Bearing  Restrictions Per Provider Order: No     Mobility  Bed Mobility               General bed mobility comments: not tested    Transfers Overall transfer level: Independent Equipment used: None               General transfer comment: pt stands without AD and amulates approx 40 ft --supervision assist    Ambulation/Gait Ambulation/Gait assistance: Supervision Gait Distance (Feet): 26ft x1, 17ft x1, 40 Feet total Assistive device: None Gait Pattern/deviations: Step-through pattern (crouched gait and increased knee flexion bilat) Gait velocity: slighty decreased     General Gait Details: monitor O2 tubing while ambulating   Stairs             Wheelchair Mobility     Tilt Bed    Modified Rankin (Stroke Patients Only)       Balance Overall balance assessment: Mild deficits observed, not formally tested Sitting-balance support: Feet supported Sitting balance-Leahy Scale: Normal     Standing balance support: No upper extremity supported, During functional activity Standing balance-Leahy Scale: Good                              Communication Communication Communication: No apparent difficulties  Cognition Arousal: Alert Behavior During Therapy: WFL for tasks assessed/performed   PT - Cognitive impairments: No apparent impairments                         Following commands: Intact      Cueing Cueing Techniques: Verbal cues  Exercises Other Exercises Other Exercises: edu on stair negotiation with L knee, importance of  rest breaks and self monintoring    General Comments General comments (skin integrity, edema, etc.): pt on 2L [place], SpO2 monitored >90% at rest and with activity      Pertinent Vitals/Pain Pain Assessment Pain Assessment: Faces Pain Score: 0-No pain Faces Pain Scale: Hurts a little bit Pain Location: L knee Pain Descriptors / Indicators: Constant, Discomfort Pain Intervention(s): Limited activity within  patient's tolerance, Monitored during session    Home Living                          Prior Function            PT Goals (current goals can now be found in the care plan section) Acute Rehab PT Goals Patient Stated Goal: get better PT Goal Formulation: With patient Time For Goal Achievement: 07/17/24 Potential to Achieve Goals: Good Progress towards PT goals: Progressing toward goals    Frequency    Min 1X/week      PT Plan      Co-evaluation              AM-PAC PT 6 Clicks Mobility   Outcome Measure  Help needed turning from your back to your side while in a flat bed without using bedrails?: None Help needed moving from lying on your back to sitting on the side of a flat bed without using bedrails?: None Help needed moving to and from a bed to a chair (including a wheelchair)?: None Help needed standing up from a chair using your arms (e.g., wheelchair or bedside chair)?: None Help needed to walk in hospital room?: None Help needed climbing 3-5 steps with a railing? : A Little 6 Click Score: 23    End of Session Equipment Utilized During Treatment: Oxygen Activity Tolerance: Patient tolerated treatment well Patient left: in chair, call bell within reach  Nurse Communication: Mobility status PT Visit Diagnosis: Difficulty in walking, not elsewhere classified (R26.2)     Time: 8379-8366 PT Time Calculation (min) (ACUTE ONLY): 13 min  Charges:                            Corean Newport, SPT 07/23/2024, 4:54 PM

## 2024-07-23 NOTE — Plan of Care (Signed)
  Problem: Education: Goal: Knowledge of General Education information will improve Description: Including pain rating scale, medication(s)/side effects and non-pharmacologic comfort measures Outcome: Progressing   Problem: Health Behavior/Discharge Planning: Goal: Ability to manage health-related needs will improve Outcome: Progressing   Problem: Clinical Measurements: Goal: Will remain free from infection Outcome: Progressing   Problem: Clinical Measurements: Goal: Respiratory complications will improve Outcome: Progressing   Problem: Clinical Measurements: Goal: Cardiovascular complication will be avoided Outcome: Progressing   Problem: Activity: Goal: Risk for activity intolerance will decrease Outcome: Progressing   Problem: Nutrition: Goal: Adequate nutrition will be maintained Outcome: Progressing   Problem: Elimination: Goal: Will not experience complications related to bowel motility Outcome: Progressing   Problem: Elimination: Goal: Will not experience complications related to urinary retention Outcome: Progressing   Problem: Pain Managment: Goal: General experience of comfort will improve and/or be controlled Outcome: Progressing

## 2024-07-23 NOTE — TOC Transition Note (Signed)
 Transition of Care [facility]) - Discharge Note   Patient Details  Name: [name] MRN: [id number] Date of Birth: 04/20/1946  Transition of Care [facility]) CM/SW Contact:  Victory Jackquline RAMAN, RN Phone Number: 07/23/2024, 3:08 PM   Clinical Narrative:   2:45 pm: RNCM received a message from MD via secure chat notifying me that the patient was being discharged this afternoon and the patient needed oxygen ordered.   2:49 pm: RNCM contacted Mitch with [employer] and he will have it delivered to bedside this afternoon.  3:06 pm: RNCM called patient's spouse Rock @ [phone], introduced role and explained that discharge planning would be discussed.Informed her that her husband was being discharged home today. She informed me that her son would be picking him up. No further concerns. RNCM signing off.    Final next level of care: Home/Self Care Barriers to Discharge: Barriers Resolved   Patient Goals and CMS Choice            Discharge Placement                Patient to be transferred to facility by: Son Name of family member notified: Rock Patient and family notified of of transfer: 07/23/24  Discharge Plan and Services Additional resources added to the After Visit Summary for                  DME Arranged: Oxygen DME Agency: [employer] Date DME Agency Contacted: 07/23/24   Representative spoke with at DME Agency: Thomasina            Social Drivers of Health (SDOH) Interventions SDOH Screenings   Food Insecurity: No Food Insecurity (07/18/2024)  Housing: Low Risk (07/18/2024)  Transportation Needs: No Transportation Needs (07/18/2024)  Utilities: Not At Risk (07/18/2024)  Depression (PHQ2-9): Low Risk (07/16/2024)  Financial Resource Strain: Low Risk  (05/08/2024)   Received from [facility]  Social Connections: Moderately Isolated (07/18/2024)  Tobacco Use: Medium Risk (07/18/2024)     Readmission Risk Interventions    07/20/2024   12:29 PM   Readmission Risk Prevention Plan  Transportation Screening Complete  PCP or Specialist Appt within 3-5 Days --  HRI or Home Care Consult Complete  Social Work Consult for Recovery Care Planning/Counseling Complete  Medication Review [employer]) Complete

## 2024-07-23 NOTE — Discharge Summary (Signed)
 Physician Discharge Summary   Patient: [name] MRN: [id number]  DOB: 14-May-1946   Admit:     Date of Admission: 07/18/2024 Admitted from: home   Discharge: Date of discharge: 07/23/2024 Disposition: Home health Condition at discharge: fair  CODE STATUS: FULL CODE     Discharge Physician: Laneta Blunt, DO [facility]     PCP: [name], MD  Recommendations for Outpatient Follow-up:  Follow up with PCP [name], MD in 1-2 weeks Follow as directed w/ oncology, cardiology      Discharge Diagnoses: Principal Problem:   Acute on chronic heart failure with preserved ejection fraction [facility]) Active Problems:   Acute hypoxic respiratory failure (HCC)   Possible fluid overload due to blood transfusion (11/20. 11/22, 07/16/2024)   Recurrent pleural effusion on right   S/P TAVR (transcatheter aortic valve replacement)   Moderate to severe mitral and tricuspid regurgitation   CAD (coronary artery disease), s/p CABG x 3   Paroxysmal atrial fibrillation (HCC)   Anemia in chronic kidney disease   Essential hypertension   OSA (obstructive sleep apnea)   Severe mitral regurgitation   Stage 3b chronic kidney disease (HCC)   Lymphadenopathy       Hospital Course: Hospital course / significant events:   HPI: 78 y.o. 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  He presented with 1 day of intermittent chest pain, shortness of breath and  fatigue.  He denied cough, fever or chills or lower extremity pain.  He took a nitroglycerin  at home with some relief in chest pain.  Patient is followed by hematology and states he has been getting as needed blood transfusions for the past year and he usually feels much better after his transfusions but after the last 3 transfusions this past month he has felt more winded.  12/06: to ED.Tachypnea to 22, O2 sat initially 91% on room air, desatted to 82 while in the ED requiring 2 L to maintain sats in the high 90s.  Labs notable for troponin 75--69 and proBNP 13,000.  Hemoglobin 8.8, up from 7.9 about 4 days prior.  Creatinine 1.9 which is about his baseline for CKD 3B. EKG showed A-fib at 66 with RBBB. Chest x-ray showed stable bibasilar consolidation and effusions right greater than left and a stable cardiac silhouette. Treated with IV Lasix . Admission requested. Admitted to hospitalist service w/ HFpEF and associated acute hypoxic respiratory failure.  12/07.  Lasix  to 60 mg IV twice daily.  3.5 L O2 [place].  Hgb down to 7.2.  (+)recurrent pleural effusion R>L  12/08.  Hgb stable 7.2.  LDH high.  Will add on haptoglobin.  Tbili 1.1.  Case discussed with oncology to get CT scan of the chest abdomen pelvis without contrast.  Continue IV Lasix . 1 unit PRBC.  R Thoracentesis removing 450 mL  12/09.  4L O2 [place].  CT scan of the chest abdomen pelvis shows progressive mediastinal adenopathy suggestive of chronic inflammatory or low-grade lymphoproliferative process.  Small to bilateral pleural effusions right greater than left.  Stable aortocaval  adenopathy measuring 15 mm in short axis nonspecific, moderate stool burden 12/10: transition to po diuretics - torsemide  at increased dose of 40 mg daily.  12/11: Hgb below 8, unit PRBC today, otherwise doing well / baseline and pt comfortable w/ discharge, nothing further at this time from cardiology, follow closely outpatient      Consultants:  Cardiology  Medical  Oncology  Procedures/Surgeries: 12/08: R thoracentesis       ASSESSMENT & PLAN:   Acute on chronic heart failure with preserved ejection fraction  Patient has a normal EF 60 to 65% but has severe mitral regurgitation and severe pulmonary hypertension.   torsemide  40 mg daily  Cardiology following outpatient  on Imdur , Jardiance , low-dose Coreg    Acute hypoxic respiratory failure  Patient on 4 L currently.  Had a pulse ox of 83% on room air.  Patient does not wear oxygen at home.   He is s/p R thoracentesis  O2 at home - ordered, see PT note for O2 saturation testing   Recurrent pleural effusion on right Thoracentesis drew off 450 mL underneath the right lung on 12/8 cytology and flow cytometry    Paroxysmal atrial fibrillation  Rate controlled Continue carvedilol  and aspirin  not on anticoagulation due to recurrent GIB / anemia requiring transfusions   CAD (coronary artery disease), s/p CABG x 3 Essential HTN HLD Had chest pain that was resolved with nitroglycerin  Troponin slightly elevated but downtrending at 75-> 69 Continue aspirin , carvedilol , atorvastatin , Imdur , nitroglycerin  sublingual as needed chest pain   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  With LDH being high, CT scan of the chest, abdomen and pelvis showing progressive mediastinal adenopathy suggesting of chronic inflammatory or low-grade lymphoproliferative process.   Case discussed with Dr. Melanee oncology and she will set up for PET CT scan as outpatient.    Other testing as per oncology - follow outpatient  PRBC transfusion as needed would recommend goal  Hgb over 8, HemOnc to formally follow    Lymphadenopathy High LDH and lymphadenopathy seen in the mediastinum and aortocaval area.  Case discussed with Dr. Melanee oncology and she will have to order a PET CT scan as outpatient.  Will await for flow cytometry  and cytology report from pleural fluid. Follow w/ oncology    Stage 3b chronic kidney disease  High risk AKI/cardiorenal, esp on diuresis.   Creatinine 1.93 with a GFR of 35 Monitor BMP   OSA (obstructive sleep apnea) Patient is CPAP intolerant        overweight based on BMI: Body mass index is 25.42 kg/m.[name] Significantly low or high BMI is associated with higher medical risk.  Underweight - under 18  overweight - 25 to 29 obese - 30 or more Class 1 obesity: BMI of 30.0 to 34 Class 2 obesity: BMI of 35.0 to 39 Class 3 obesity: BMI of 40.0 to 49 Super Morbid Obesity: BMI 50-59 Super-super Morbid Obesity: BMI 60+ Healthy nutrition and physical activity advised as adjunct to other disease management and risk reduction treatments              Discharge Instructions  Allergies as of 07/23/2024   No Known Allergies      Medication List     STOP taking these medications    clopidogrel  75 MG tablet Commonly known as: PLAVIX        TAKE these medications  amLODipine  10 MG tablet Commonly known as: NORVASC  Take 10 mg by mouth daily.   aspirin  EC 81 MG tablet Take 81 mg by mouth daily.   atorvastatin  80 MG tablet Commonly known as: LIPITOR  Take 80 mg by mouth.  Take 80 mg by mouth in the morning.   bisacodyl  5 MG EC tablet Commonly known as: DULCOLAX Take 5 mg by mouth as needed for moderate constipation.   carvedilol  3.125 MG tablet Commonly known as: COREG  Take 1 tablet (3.125 mg total) by mouth 2 (two) times daily with a meal.   cyanocobalamin  1000 MCG tablet Commonly known as: VITAMIN B12 Take 1,000 mcg by mouth daily.   dexlansoprazole 60 MG capsule Commonly known as: DEXILANT Take 1 capsule by mouth daily.   Iron  325 (65 Fe) MG Tabs Take 1 tablet by mouth daily.   isosorbide  mononitrate 120 MG 24 hr tablet Commonly known as: IMDUR  Take 120 mg by mouth daily.   Jardiance  10 MG Tabs tablet Generic drug: empagliflozin  Take 1  tablet (10 mg total) by mouth daily before breakfast.   lactulose  10 GM/15ML solution Commonly known as: CHRONULAC  Taking 15-20 cc by mouth 3 to 4 times a day for constiption What changed: additional instructions   nitroGLYCERIN  0.4 MG SL tablet Commonly known as: NITROSTAT  Place 0.4 mg under the tongue every 5 (five) minutes as needed.   torsemide  20 MG tablet Commonly known as: DEMADEX  Take 2 tablets (40 mg total) by mouth daily. What changed: how much to take         Follow-up Information     Paraschos, Blease Capaldi, MD. Go in 1 week(s).   Specialty: Cardiology Why: Appointment scheduled for 07/28/24 at 11 AM Contact information: [address] [facility] [place] [address] [phone]                 Allergies[1]   Subjective: pt feeling better today following unit PRBC, no CP/SOB though he does report fatigue which is fairly normal for him   Discharge Exam: BP (!) 149/61   Pulse (!) 58   Temp 97.8 F (36.6 C) (Oral)   Resp 17   Ht 6' 0.5 (1.842 m)   Wt 86.2 kg   SpO2 99%   BMI 25.42 kg/m  General: Pt is alert, awake, not in acute distress Cardiovascular: RRR, S1/S2 +, no rubs, no gallops Respiratory: CTA bilaterally, no wheezing, no rhonchi Abdominal: Soft, NT, ND, bowel sounds + Extremities: no edema, no cyanosis     The results of significant diagnostics from this hospitalization (including imaging, microbiology, ancillary and laboratory) are listed below for reference.     Microbiology: Recent Results (from the past 240 hours)  Body fluid culture w Gram Stain     Status: None   Collection Time: 07/20/24 10:06 AM   Specimen: PATH Cytology Pleural fluid  Result Value Ref Range Status   Specimen Description   Final    PLEURAL Performed at [facility], [address] [place], [place] [address]    Special Requests   Final    NONE Performed at [facility], [address]  [place], [place] [address]    Gram Stain NO WBC SEEN NO ORGANISMS SEEN   Final   Culture   Final    NO GROWTH 3 DAYS Performed at [facility], [address], [place], [place] [address]    Report Status 07/23/2024 FINAL  Final     Labs: BNP (last  3 results) Recent Labs    08/22/23 0730 10/16/23 1415 03/02/24 1314  BNP 1,069.2* 942.6* 1,183.2*   Basic Metabolic Panel: Recent Labs  Lab 07/19/24 0547 07/20/24 0425 07/21/24 0400 07/22/24 0449 07/23/24 0459  NA 141 140 138 137 137  K 4.3 4.1 4.2 4.1 4.0  CL 106 103 99 97* 97*  CO2 29 30 31 31  32  GLUCOSE 162* 144* 154* 148* 172*  BUN 41* 41* 40* 39* 44*  CREATININE 1.93* 1.89* 1.89* 2.09* 2.11*  CALCIUM  10.1 10.1 9.5 10.0 9.7   Liver Function Tests: Recent Labs  Lab 07/20/24 1042  AST 30  ALT 10  ALKPHOS 110  BILITOT 1.1  PROT 7.0  ALBUMIN 3.6   No results for input(s): LIPASE, AMYLASE in the last 168 hours. No results for input(s): AMMONIA in the last 168 hours. CBC: Recent Labs  Lab 07/19/24 0547 07/20/24 0425 07/21/24 0400 07/22/24 0449 07/23/24 0459  WBC 6.3 5.4 5.4 5.7 4.9  HGB 7.2* 7.2* 8.4* 8.7* 7.7*  HCT 22.8* 23.5* 26.9* 27.1* 24.7*  MCV 98.7 101.3* 100.7* 97.1 100.4*  PLT 133* 125* 127* 158 134*   Cardiac Enzymes: No results for input(s): CKTOTAL, CKMB, CKMBINDEX, TROPONINI in the last 168 hours. BNP: Invalid input(s): POCBNP CBG: No results for input(s): GLUCAP in the last 168 hours. D-Dimer No results for input(s): DDIMER in the last 72 hours. Hgb A1c No results for input(s): HGBA1C in the last 72 hours. Lipid Profile No results for input(s): CHOL, HDL, LDLCALC, TRIG, CHOLHDL, LDLDIRECT in the last 72 hours. Thyroid  function studies No results for input(s): TSH, T4TOTAL, T3FREE, THYROIDAB in the last 72 hours.  Invalid input(s): FREET3 Anemia work up Recent Labs    07/22/24 0449  RETICCTPCT 5.1*   Urinalysis    Component Value  Date/Time   COLORURINE YELLOW (A) 03/02/2024 1617   APPEARANCEUR CLEAR (A) 03/02/2024 1617   LABSPEC 1.014 03/02/2024 1617   PHURINE 5.0 03/02/2024 1617   GLUCOSEU >=500 (A) 03/02/2024 1617   HGBUR NEGATIVE 03/02/2024 1617   BILIRUBINUR NEGATIVE 03/02/2024 1617   KETONESUR NEGATIVE 03/02/2024 1617   PROTEINUR 100 (A) 03/02/2024 1617   NITRITE NEGATIVE 03/02/2024 1617   LEUKOCYTESUR NEGATIVE 03/02/2024 1617   Sepsis Labs Recent Labs  Lab 07/20/24 0425 07/21/24 0400 07/22/24 0449 07/23/24 0459  WBC 5.4 5.4 5.7 4.9   Microbiology Recent Results (from the past 240 hours)  Body fluid culture w Gram Stain     Status: None   Collection Time: 07/20/24 10:06 AM   Specimen: PATH Cytology Pleural fluid  Result Value Ref Range Status   Specimen Description   Final    PLEURAL Performed at [facility], [address], [place], [place] [address]    Special Requests   Final    NONE Performed at [facility], [address] [place], [place] [address]    Gram Stain NO WBC SEEN NO ORGANISMS SEEN   Final   Culture   Final    NO GROWTH 3 DAYS Performed at [facility], [address] [place], [place] [address]    Report Status 07/23/2024 FINAL  Final   Imaging DG Chest 2 View Result Date: 07/18/2024 CLINICAL DATA:  Chest pain and shortness of breath EXAM: CHEST - 2 VIEW COMPARISON:  07/02/2024 FINDINGS: Frontal and lateral views of the chest demonstrate stable postsurgical changes from median sternotomy and aortic valve replacement. Cardiac silhouette is enlarged but stable. Persistent bibasilar veiling opacities consistent with consolidation  and effusions, right greater than left. No pneumothorax. No acute bony abnormalities. IMPRESSION: 1. Stable bibasilar consolidation and effusions, right greater than left. 2. Stable enlarged cardiac silhouette. Electronically Signed   By: Ozell Daring M.D.   On: 07/18/2024 14:53      Time coordinating discharge: over  30 minutes  SIGNED:  Alvis Pulcini DO [facility]       [1] No Known Allergies

## 2024-07-23 NOTE — TOC CM/SW Note (Signed)
 SATURATION QUALIFICATIONS:  SaO2 on room air at rest = 94% SaO2 on room air while ambulating = 85% SaO2 on 2 liters of O2 while ambulating = 93%   Above explains why patient needs home oxygen

## 2024-07-23 NOTE — Progress Notes (Signed)
 AVS reviewed and given to patient. PIV removed. All questions answered.

## 2024-07-23 NOTE — Plan of Care (Signed)

## 2024-07-23 NOTE — Progress Notes (Signed)
 [facility] CLINIC CARDIOLOGY PROGRESS NOTE   Patient ID: [name] MRN: [id number] DOB/AGE: [birth year] 76 y.o.  Admit date: 07/18/2024 Referring Physician Dr. Delayne Solian  Primary Physician [name], MD  Primary Cardiologist Dr. Ammon Reason for Consultation AoCHF  HPI: [name] is a 78 y.o. male with a past medical history of chronic HFpEF, persistent atrial fibrillation, aortic stenosis s/p TAVR (01/2024), coronary artery disease s/p CABG x3, multiple coronary stents, most recent stent to ostial SVG in-stent restenosis (2021), hypertension, hyperlipidemia, OSA, CKD, diabetes who presented to the ED on 07/18/2024 for SOB. Cardiology was consulted for further evaluation.   Interval History:  -Patient seen and examined this AM, feeling well overall. Appears comfortable, still with some SOB when he moves around but this is stable. -Cr stable on labs today. Hgb down to 7.7 from 8.7 yesterday.  -BP and HR remain stable.   Review of systems complete and found to be negative unless listed above   Vitals:   07/22/24 1726 07/22/24 2014 07/22/24 2356 07/23/24 0456  BP: (!) 141/56 (!) 139/56 (!) 149/72 (!) 151/69  Pulse: (!) 59 60 (!) 58 (!) 52  Resp: 16 20 16 17   Temp: (!) 97.5 F (36.4 C) 98.2 F (36.8 C) 98.1 F (36.7 C) 98.6 F (37 C)  TempSrc:      SpO2: 98% 97% 99% 99%  Weight:    86.2 kg  Height:         Intake/Output Summary (Last 24 hours) at 07/23/2024 0825 Last data filed at 07/23/2024 9188 Gross per 24 hour  Intake 836 ml  Output 1335 ml  Net -499 ml     PHYSICAL EXAM General: Chronically ill appearing male, well nourished, in no acute distress. HEENT: Normocephalic and atraumatic. Neck: No JVD.  Lungs: Normal respiratory effort on 2L [place]. Clear bilaterally to auscultation. No wheezes, crackles, rhonchi.  Heart: Irregularly irregular. Normal S1 and S2 without gallops or murmurs. Radial & DP pulses 2+ bilaterally. Abdomen: Non-distended  appearing.  Msk: Normal strength and tone for age. Extremities: No clubbing, cyanosis or edema.   Neuro: Alert and oriented X 3. Psych: Mood appropriate, affect congruent.    LABS: Basic Metabolic Panel: Recent Labs    07/22/24 0449 07/23/24 0459  NA 137 137  K 4.1 4.0  CL 97* 97*  CO2 31 32  GLUCOSE 148* 172*  BUN 39* 44*  CREATININE 2.09* 2.11*  CALCIUM  10.0 9.7   Liver Function Tests: Recent Labs    07/20/24 1042  AST 30  ALT 10  ALKPHOS 110  BILITOT 1.1  PROT 7.0  ALBUMIN 3.6   No results for input(s): LIPASE, AMYLASE in the last 72 hours. CBC: Recent Labs    07/22/24 0449 07/23/24 0459  WBC 5.7 4.9  HGB 8.7* 7.7*  HCT 27.1* 24.7*  MCV 97.1 100.4*  PLT 158 134*   Cardiac Enzymes: No results for input(s): CKTOTAL, CKMB, CKMBINDEX, TROPONINIHS in the last 72 hours. BNP: No results for input(s): BNP in the last 72 hours. D-Dimer: No results for input(s): DDIMER in the last 72 hours. Hemoglobin A1C: No results for input(s): HGBA1C in the last 72 hours. Fasting Lipid Panel: No results for input(s): CHOL, HDL, LDLCALC, TRIG, CHOLHDL, LDLDIRECT in the last 72 hours. Thyroid  Function Tests: No results for input(s): TSH, T4TOTAL, T3FREE, THYROIDAB in the last 72 hours.  Invalid input(s): FREET3 Anemia Panel: Recent Labs    07/22/24 0449  RETICCTPCT 5.1*    No results found.  ECHO 02/2024: 1. Left ventricular ejection fraction, by estimation, is 60 to 65%. The left ventricle has normal function. The left ventricle has no regional wall motion abnormalities. Left ventricular diastolic parameters were normal.   2. Right ventricular systolic function is normal. The right ventricular size is normal.   3. The mitral valve is normal in structure. Moderate to severe mitral  valve regurgitation. No evidence of mitral stenosis.   4. Tricuspid valve regurgitation is mild to moderate.   5. The aortic valve is normal in  structure. Aortic valve regurgitation is  mild. No aortic stenosis is present.   6. The inferior vena cava is normal in size with greater than 50%  respiratory variability, suggesting right atrial pressure of 3 mmHg.   [facility] 03/2024: NORMAL LEFT VENTRICULAR SYSTOLIC FUNCTION WITH SEVERE LVH  ESTIMATED EF: >55%, CALC EF(3D): 55%  ELEVATED LA PRESSURES WITH DIASTOLIC DYSFUNCTION (GRADE 3)  NORMAL RIGHT VENTRICULAR SYSTOLIC FUNCTION  VALVULAR REGURGITATION: MILD AR, MILD MR, TRIVIAL PR, MILD TR  ESTIMATED RVSP: 85 mmHg (ABNORMAL)  VALVULAR STENOSIS: TRANSCATHETER BIOPROSTHETIC, No MS, No PS, No TS    TELEMETRY (personally reviewed): atrial fibrillation rate 60-70s  EKG (personally reviewed): atrial fibrillation rate 66 bpm  DATA reviewed by me 07/23/2024: last 24h vitals tele labs imaging I/O, hospitalist progress note  Principal Problem:   Acute on chronic heart failure with preserved ejection fraction (HCC) Active Problems:   Essential hypertension   OSA (obstructive sleep apnea)   CAD (coronary artery disease), s/p CABG x 3   Paroxysmal atrial fibrillation (HCC)   Acute hypoxic respiratory failure (HCC)   Severe mitral regurgitation   Stage 3b chronic kidney disease (HCC)   Recurrent pleural effusion on right   Anemia in chronic kidney disease   S/P TAVR (transcatheter aortic valve replacement)   Moderate to severe mitral and tricuspid regurgitation   Possible fluid overload due to blood transfusion (11/20. 11/22, 07/16/2024)   Lymphadenopathy    ASSESSMENT AND PLAN: [name] is a 78 y.o. male with a past medical history of chronic HFpEF, persistent atrial fibrillation, aortic stenosis s/p TAVR (01/2024), coronary artery disease s/p CABG x3, multiple coronary stents, most recent stent to ostial SVG in-stent restenosis (2021), hypertension, hyperlipidemia, OSA, CKD, diabetes who presented to the ED on 07/18/2024 for SOB. Cardiology was consulted for further evaluation.   #  Acute hypoxic respiratory failure # Acute on chronic HFpEF # Bilateral pleural effusions # Coronary artery disease s/p CABG # Demand ischemia # Aortic stenosis s/p TAVR 01/2024 Patient presented to ED for evaluation of SOB.  BNP elevated at 13,000.  Trops mild and flat.  Chest x-ray with bilateral pleural effusions. S/p thoracentesis with 450 mL output 07/20/2024. S/p transfusion 07/20/2024. - Continue PO torsemide  at increased dose of 40 mg daily.  - Plavix  held given anemia. Continue home atorvastatin  80 mg daily, aspirin  81 mg daily. - Resume home amlodipine  10 mg given BP elevated. Continue home Imdur  120 mg daily, carvedilol  3.125 mg twice daily, jardiance  10 mg daily.  - Mild troponin elevation most consistent with demand/supply mismatch and not ACS.   Patient stable for DC from cardiac perspective however with downtrending Hgb may require transfusion.   This patient's case was discussed and created with Dr. Ammon and he is in agreement.  Signed:  Danita Bloch, PA-C  07/23/2024, 8:25 AM [facility] Cardiology

## 2024-07-24 LAB — TYPE AND SCREEN
ABO/RH(D): A POS
Antibody Screen: NEGATIVE
Unit division: 0
Unit division: 0

## 2024-07-24 LAB — BPAM RBC
Blood Product Expiration Date: 202601112359
Blood Product Expiration Date: 202512292359
ISSUE DATE / TIME: 202512111123
ISSUE DATE / TIME: 202512081030
Unit Type and Rh: 6200
Unit Type and Rh: 6200

## 2024-07-24 LAB — COMPLEMENT, TOTAL: Compl, Total (CH50): >60 U/mL (ref >41)

## 2024-07-25 LAB — PNH PROFILE (-HIGH SENSITIVITY)

## 2024-07-26 LAB — LACTATE DEHYDROGENASE, ISOENZYMES
LDH 1: 34 % — ABNORMAL HIGH (ref 17–32)
LDH 2: 38 % (ref 25–40)
LDH 3: 17 % (ref 17–27)
LDH 4: 6 % (ref 5–13)
LDH 5: 5 % (ref 4–20)
LDH Isoenzymes, Total: 951 IU/L — ABNORMAL HIGH (ref 121–224)

## 2024-07-31 ENCOUNTER — Telehealth: Payer: Self-pay | Admitting: Oncology

## 2024-07-31 DIAGNOSIS — D649 Anemia, unspecified: Secondary | ICD-10-CM

## 2024-07-31 LAB — FUNGUS CULTURE WITH STAIN

## 2024-07-31 LAB — FUNGAL ORGANISM REFLEX

## 2024-07-31 LAB — FUNGUS CULTURE RESULT

## 2024-07-31 NOTE — Telephone Encounter (Signed)
 please let him know that we do not have any room for transfusion today or on Monday.  Megan please see if we can accommodate him on Wednesday and if he is willing to wait until then

## 2024-07-31 NOTE — Telephone Encounter (Signed)
 Patient called to request someone set him a blood transfusion up at the ER so he doesn't have to be admitted to the hospital. I did tell him that clinic is closed and I was not sure anyone would see the message but I would send it. He understood.

## 2024-08-01 ENCOUNTER — Emergency Department

## 2024-08-01 ENCOUNTER — Emergency Department: Admission: EM | Admit: 2024-08-01 | Discharge: 2024-08-01 | Disposition: A | Discharge status: Home / Self Care

## 2024-08-01 DIAGNOSIS — R531 Weakness: Secondary | ICD-10-CM | POA: Diagnosis not present

## 2024-08-01 DIAGNOSIS — R0602 Shortness of breath: Secondary | ICD-10-CM | POA: Diagnosis present

## 2024-08-01 DIAGNOSIS — D649 Anemia, unspecified: Secondary | ICD-10-CM | POA: Diagnosis not present

## 2024-08-01 DIAGNOSIS — N189 Chronic kidney disease, unspecified: Secondary | ICD-10-CM | POA: Insufficient documentation

## 2024-08-01 LAB — CBC
HCT: 24.5 % — ABNORMAL LOW (ref 39.0–52.0)
Hemoglobin: 7.6 g/dL — ABNORMAL LOW (ref 13.0–17.0)
MCH: 31.1 pg (ref 26.0–34.0)
MCHC: 31 g/dL (ref 30.0–36.0)
MCV: 100.4 fL — ABNORMAL HIGH (ref 80.0–100.0)
Platelets: 115 K/uL — ABNORMAL LOW (ref 150–400)
RBC: 2.44 MIL/uL — ABNORMAL LOW (ref 4.22–5.81)
RDW: 17.7 % — ABNORMAL HIGH (ref 11.5–15.5)
WBC: 6.9 K/uL (ref 4.0–10.5)
nRBC: 0 % (ref 0.0–0.2)

## 2024-08-01 LAB — BASIC METABOLIC PANEL WITH GFR
Anion gap: 9 (ref 5–15)
BUN: 45 mg/dL — ABNORMAL HIGH (ref 8–23)
CO2: 29 mmol/L (ref 22–32)
Calcium: 10.5 mg/dL — ABNORMAL HIGH (ref 8.9–10.3)
Chloride: 102 mmol/L (ref 98–111)
Creatinine, Ser: 2.2 mg/dL — ABNORMAL HIGH (ref 0.61–1.24)
GFR, Estimated: 30 mL/min — ABNORMAL LOW
Glucose, Bld: 150 mg/dL — ABNORMAL HIGH (ref 70–99)
Potassium: 5.9 mmol/L — ABNORMAL HIGH (ref 3.5–5.1)
Sodium: 140 mmol/L (ref 135–145)

## 2024-08-01 LAB — PREPARE RBC (CROSSMATCH)

## 2024-08-01 MED ORDER — SODIUM CHLORIDE 0.9% IV SOLUTION
Freq: Once | INTRAVENOUS | Status: DC
  Filled 2024-08-01: qty 250

## 2024-08-01 MED ORDER — SODIUM ZIRCONIUM CYCLOSILICATE 10 G PO PACK
10.0000 g | PACK | Freq: Once | ORAL | Status: AC
  Administered 2024-08-01: 10 g via ORAL
  Filled 2024-08-01: qty 1

## 2024-08-01 NOTE — ED Notes (Signed)
 Pt reports lab just redrew CBC, pending results.

## 2024-08-01 NOTE — ED Triage Notes (Addendum)
 Pt to ED via POV from home with son. Pt reports increased SOB and intermittent CP. Pt reports weekly blood transfusion for [facility] at [facility]. Pt denies known bleeding. Pt hx of afib but taken off eliquis . Pt reports last tx they placed him on 2L [place]. Pt appears pale.

## 2024-08-01 NOTE — ED Provider Notes (Signed)
 "  [facility] Provider Note    Event Date/Time   First MD Initiated Contact with Patient 08/01/24 0930     (approximate)   History   Shortness of Breath (/)   HPI  [name] is a 78 y.o. male presenting with concern of fatigue and weakness shortness of breath.  His medical history is complicated with persistent anemia without clear etiology.  He was just recently discharged from our facility with concern of shortness of breath at that time concern for fluid overload status he had blood transfusion as well as thoracentesis performed, he was able to go home he was feeling better he followed up with cardiology.  I reviewed both the discharge summary as well as Dr. Ammon' note from 4 days ago.  He is being worked up for persistent symptomatic anemia and requires frequent blood transfusions.  He presents today feeling as if he needs another blood transfusion stating that he is having some chest wall discomfort and some increased shortness of breath with exertion.  It seems that this has been an ongoing issue for him and he has had thorough workup with cardiology as well as gastroenterology.  The suspicion is that his severe anemia results and his dyspnea on exertion as well as at times his chest pain.  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  He was taken off his Eliquis  as well as other blood thinners and he has a Watchman device in place.  He feels this is very consistent with his symptomatic anemia at this time, he does not want to be admitted he is requesting blood transfusion and hopeful discharge home afterwards.  It seems in the past he has been given blood transfusions with hemoglobin up to even 8.     Physical Exam   Triage Vital Signs: ED Triage Vitals [08/01/24 0906]  Encounter Vitals Group     BP (!) 143/118     Girls Systolic BP Percentile      Girls Diastolic BP Percentile      Boys Systolic BP Percentile      Boys Diastolic BP Percentile       Pulse Rate (!) 48     Resp 20     Temp 98.2 F (36.8 C)     Temp Source Oral     SpO2 94 %     Weight      Height      Head Circumference      Peak Flow      Pain Score      Pain Loc      Pain Education      Exclude from Growth Chart     Most recent vital signs: Vitals:   08/01/24 1400 08/01/24 1415  BP:    Pulse: (!) 53 (!) 58  Resp: 16 (!) 25  Temp:    SpO2: 98% 100%     General: Awake, no distress.  CV:  Good peripheral perfusion.  Resp:  Normal effort.  Abd:  No distention.  Soft nontender Other:     ED Results / Procedures / Treatments   Labs (all labs ordered are listed, but only abnormal results are displayed) Labs Reviewed  BASIC METABOLIC PANEL WITH GFR - Abnormal; Notable for the following components:      Result Value   Potassium 5.9 (*)    Glucose, Bld 150 (*)    BUN 45 (*)    Creatinine, Ser 2.20 (*)    Calcium  10.5 (*)  GFR, Estimated 30 (*)    All other components within normal limits  CBC - Abnormal; Notable for the following components:   RBC 2.44 (*)    Hemoglobin 7.6 (*)    HCT 24.5 (*)    MCV 100.4 (*)    RDW 17.7 (*)    Platelets 115 (*)    All other components within normal limits  TYPE AND SCREEN  PREPARE RBC (CROSSMATCH)     EKG  Significant artifact making it difficult to interpret this EKG, appears to have a irregularly irregular rhythm with a rate of about 70, axis of -70, intervals appear to demonstrate a 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   RADIOLOGY  Bilateral pleural effusions are present, poor respiratory effort on the initial x-ray, but repeat image demonstrates appropriate respiratory effort with presence of bilateral atelectasis and pleural effusions.  I compared to the prior x-ray from December 8 which seems to be relatively improved by comparison  PROCEDURES:  Critical Care  performed: No  Procedures   MEDICATIONS ORDERED IN ED: Medications  0.9 %  sodium chloride  infusion (Manually program via Guardrails IV Fluids) (has no administration in time range)  sodium zirconium cyclosilicate  (LOKELMA ) packet 10 g (10 g Oral Given 08/01/24 1037)     IMPRESSION / MDM / ASSESSMENT AND PLAN / ED COURSE  I reviewed the triage vital signs and the nursing notes.                               Patient's presentation is most consistent with acute complicated illness / injury requiring diagnostic workup.  78 year old male presenting today with concern of anemia.  History of chronic kidney disease, seems they have been worked up thoroughly with gastroenterology as well as cardiology for persistent shortness of breath and chest discomfort and weakness as well as anemia without clear explanation.  Currently being worked up with hematology and a recent admission requiring thoracentesis and multiple blood transfusions.  He presents today because he feels anemic again.  He appears well he is not in any acute distress, he is on his baseline 2 L oxygen requirement that he was discharged home with a few days ago, he is slightly bradycardic, but EKG appears to demonstrate a much more appropriate rate here.  His potassium is also slightly elevated for which we will give a dose of Lokelma .  I am awaiting hemoglobin result here, as I do suspect he likely is anemic, if warranted plan for blood transfusion and will follow-up determine safe disposition.   Clinical Course as of 08/01/24 1518  Sat Aug 01, 2024  1104 Hgb 7.6 here, will give 1U pRBC and reevaluate patient determine safe disposition. [SK]  1451 Patient symptoms improved, he feels much better at this time, will have him discharged home at this time discussed return precautions, he is agreeable with the plan. [SK]    Clinical Course User Index [SK] Fernand Rossie HERO, MD     FINAL CLINICAL IMPRESSION(S) / ED DIAGNOSES   Final  diagnoses:  Symptomatic anemia     Rx / DC Orders   ED Discharge Orders     None        Note:  This document was prepared using Dragon voice recognition software and may include unintentional dictation errors.   Fernand Rossie  M, MD 08/01/24 8481  "

## 2024-08-01 NOTE — Discharge Instructions (Addendum)
 You were seen today due to concern of weakness and shortness of breath.  At this time fortunately we have been able to give you 1 unit of blood.  Please be sure to follow-up with your primary doctor.  If you have new worsening symptoms such as chest pain shortness of breath lightheadedness or any other symptoms you find concerning please return to the emergency department immediately for further medical management.

## 2024-08-01 NOTE — ED Notes (Signed)
 Back from b/r via w/c by son, remains alert, NAD, calm, interactive, skin W&D, resps e/u, speaking in clear complete sentences. Blood consent signed.

## 2024-08-02 LAB — TYPE AND SCREEN
ABO/RH(D): A POS
Antibody Screen: NEGATIVE
Unit division: 0

## 2024-08-02 LAB — BPAM RBC
Blood Product Expiration Date: 202601242359
ISSUE DATE / TIME: 202512201214
Unit Type and Rh: 6200

## 2024-08-03 NOTE — Telephone Encounter (Signed)
 Per Dr. Melanee please let him know that we do not have any room for transfusion today or on Monday.  Megan please see if we can accommodate him on Wednesday and if he is willing to wait until then.   Hold tube added to LOV already scheduled tomorrow 08/04/24 at 2:30pm.  Per Megan if blood is needed we can accommodate Wed 12/24 at 8:15 am.  Outbound call to patient;  detailed voice message left.

## 2024-08-03 NOTE — Addendum Note (Signed)
 Addended by: Janisha Bueso on: 08/03/2024 02:18 PM   Modules accepted: Orders

## 2024-08-04 ENCOUNTER — Inpatient Hospital Stay

## 2024-08-04 VITALS — BP 150/61

## 2024-08-04 DIAGNOSIS — D508 Other iron deficiency anemias: Secondary | ICD-10-CM

## 2024-08-04 DIAGNOSIS — I130 Hypertensive heart and chronic kidney disease with heart failure and stage 1 through stage 4 chronic kidney disease, or unspecified chronic kidney disease: Secondary | ICD-10-CM | POA: Diagnosis not present

## 2024-08-04 DIAGNOSIS — D649 Anemia, unspecified: Secondary | ICD-10-CM

## 2024-08-04 DIAGNOSIS — D631 Anemia in chronic kidney disease: Secondary | ICD-10-CM

## 2024-08-04 LAB — CBC WITH DIFFERENTIAL (CANCER CENTER ONLY)
Abs Immature Granulocytes: 0.04 K/uL (ref 0.00–0.07)
Basophils Absolute: 0.1 K/uL (ref 0.0–0.1)
Basophils Relative: 1 %
Eosinophils Absolute: 0.2 K/uL (ref 0.0–0.5)
Eosinophils Relative: 2 %
HCT: 25.8 % — ABNORMAL LOW (ref 39.0–52.0)
Hemoglobin: 8 g/dL — ABNORMAL LOW (ref 13.0–17.0)
Immature Granulocytes: 1 %
Lymphocytes Relative: 6 %
Lymphs Abs: 0.4 K/uL — ABNORMAL LOW (ref 0.7–4.0)
MCH: 31.1 pg (ref 26.0–34.0)
MCHC: 31 g/dL (ref 30.0–36.0)
MCV: 100.4 fL — ABNORMAL HIGH (ref 80.0–100.0)
Monocytes Absolute: 0.5 K/uL (ref 0.1–1.0)
Monocytes Relative: 7 %
Neutro Abs: 5.9 K/uL (ref 1.7–7.7)
Neutrophils Relative %: 83 %
Platelet Count: 126 K/uL — ABNORMAL LOW (ref 150–400)
RBC: 2.57 MIL/uL — ABNORMAL LOW (ref 4.22–5.81)
RDW: 17.6 % — ABNORMAL HIGH (ref 11.5–15.5)
WBC Count: 7.1 K/uL (ref 4.0–10.5)
nRBC: 0 % (ref 0.0–0.2)

## 2024-08-04 LAB — IRON AND TIBC
Iron: 70 ug/dL (ref 45–182)
Saturation Ratios: 30 % (ref 17.9–39.5)
TIBC: 232 ug/dL — ABNORMAL LOW (ref 250–450)
UIBC: 162 ug/dL

## 2024-08-04 LAB — SAMPLE TO BLOOD BANK

## 2024-08-04 LAB — FERRITIN: Ferritin: 1750 ng/mL — ABNORMAL HIGH (ref 24–336)

## 2024-08-04 MED ORDER — DARBEPOETIN ALFA 100 MCG/0.5ML IJ SOSY
60.0000 ug | PREFILLED_SYRINGE | INTRAMUSCULAR | Status: DC
  Administered 2024-08-04: 60 ug via SUBCUTANEOUS
  Filled 2024-08-04: qty 0.5

## 2024-08-05 ENCOUNTER — Inpatient Hospital Stay

## 2024-08-10 ENCOUNTER — Inpatient Hospital Stay
Admission: EM | Admit: 2024-08-10 | Discharge: 2024-08-15 | DRG: 291 | Disposition: A | Discharge status: Home / Self Care | Attending: Internal Medicine | Admitting: Internal Medicine

## 2024-08-10 ENCOUNTER — Observation Stay

## 2024-08-10 ENCOUNTER — Emergency Department

## 2024-08-10 ENCOUNTER — Other Ambulatory Visit: Payer: Self-pay

## 2024-08-10 DIAGNOSIS — N184 Chronic kidney disease, stage 4 (severe): Secondary | ICD-10-CM | POA: Diagnosis present

## 2024-08-10 DIAGNOSIS — Z955 Presence of coronary angioplasty implant and graft: Secondary | ICD-10-CM

## 2024-08-10 DIAGNOSIS — G4733 Obstructive sleep apnea (adult) (pediatric): Secondary | ICD-10-CM | POA: Diagnosis present

## 2024-08-10 DIAGNOSIS — J918 Pleural effusion in other conditions classified elsewhere: Secondary | ICD-10-CM | POA: Diagnosis present

## 2024-08-10 DIAGNOSIS — R590 Localized enlarged lymph nodes: Secondary | ICD-10-CM | POA: Diagnosis present

## 2024-08-10 DIAGNOSIS — I272 Pulmonary hypertension, unspecified: Secondary | ICD-10-CM | POA: Diagnosis present

## 2024-08-10 DIAGNOSIS — I130 Hypertensive heart and chronic kidney disease with heart failure and stage 1 through stage 4 chronic kidney disease, or unspecified chronic kidney disease: Principal | ICD-10-CM | POA: Diagnosis present

## 2024-08-10 DIAGNOSIS — K760 Fatty (change of) liver, not elsewhere classified: Secondary | ICD-10-CM | POA: Diagnosis present

## 2024-08-10 DIAGNOSIS — I4821 Permanent atrial fibrillation: Secondary | ICD-10-CM | POA: Diagnosis present

## 2024-08-10 DIAGNOSIS — D500 Iron deficiency anemia secondary to blood loss (chronic): Secondary | ICD-10-CM | POA: Diagnosis present

## 2024-08-10 DIAGNOSIS — N1832 Chronic kidney disease, stage 3b: Secondary | ICD-10-CM | POA: Diagnosis present

## 2024-08-10 DIAGNOSIS — R079 Chest pain, unspecified: Secondary | ICD-10-CM

## 2024-08-10 DIAGNOSIS — D649 Anemia, unspecified: Secondary | ICD-10-CM

## 2024-08-10 DIAGNOSIS — Z7982 Long term (current) use of aspirin: Secondary | ICD-10-CM

## 2024-08-10 DIAGNOSIS — K219 Gastro-esophageal reflux disease without esophagitis: Secondary | ICD-10-CM | POA: Diagnosis present

## 2024-08-10 DIAGNOSIS — I5031 Acute diastolic (congestive) heart failure: Secondary | ICD-10-CM | POA: Diagnosis present

## 2024-08-10 DIAGNOSIS — Z860100 Personal history of colon polyps, unspecified: Secondary | ICD-10-CM

## 2024-08-10 DIAGNOSIS — Z79899 Other long term (current) drug therapy: Secondary | ICD-10-CM

## 2024-08-10 DIAGNOSIS — I340 Nonrheumatic mitral (valve) insufficiency: Secondary | ICD-10-CM | POA: Diagnosis present

## 2024-08-10 DIAGNOSIS — E1122 Type 2 diabetes mellitus with diabetic chronic kidney disease: Secondary | ICD-10-CM | POA: Diagnosis present

## 2024-08-10 DIAGNOSIS — Z952 Presence of prosthetic heart valve: Secondary | ICD-10-CM

## 2024-08-10 DIAGNOSIS — J9621 Acute and chronic respiratory failure with hypoxia: Secondary | ICD-10-CM | POA: Diagnosis present

## 2024-08-10 DIAGNOSIS — Z95818 Presence of other cardiac implants and grafts: Secondary | ICD-10-CM

## 2024-08-10 DIAGNOSIS — Z83438 Family history of other disorder of lipoprotein metabolism and other lipidemia: Secondary | ICD-10-CM

## 2024-08-10 DIAGNOSIS — Z7984 Long term (current) use of oral hypoglycemic drugs: Secondary | ICD-10-CM

## 2024-08-10 DIAGNOSIS — Z7902 Long term (current) use of antithrombotics/antiplatelets: Secondary | ICD-10-CM

## 2024-08-10 DIAGNOSIS — I2489 Other forms of acute ischemic heart disease: Secondary | ICD-10-CM | POA: Diagnosis present

## 2024-08-10 DIAGNOSIS — Z8249 Family history of ischemic heart disease and other diseases of the circulatory system: Secondary | ICD-10-CM

## 2024-08-10 DIAGNOSIS — I5033 Acute on chronic diastolic (congestive) heart failure: Secondary | ICD-10-CM | POA: Diagnosis present

## 2024-08-10 DIAGNOSIS — D631 Anemia in chronic kidney disease: Secondary | ICD-10-CM | POA: Diagnosis present

## 2024-08-10 DIAGNOSIS — R627 Adult failure to thrive: Secondary | ICD-10-CM | POA: Diagnosis present

## 2024-08-10 DIAGNOSIS — Z8616 Personal history of COVID-19: Secondary | ICD-10-CM

## 2024-08-10 DIAGNOSIS — Z6825 Body mass index (BMI) 25.0-25.9, adult: Secondary | ICD-10-CM

## 2024-08-10 DIAGNOSIS — Z91148 Patient's other noncompliance with medication regimen for other reason: Secondary | ICD-10-CM

## 2024-08-10 DIAGNOSIS — Z9049 Acquired absence of other specified parts of digestive tract: Secondary | ICD-10-CM

## 2024-08-10 DIAGNOSIS — I2510 Atherosclerotic heart disease of native coronary artery without angina pectoris: Secondary | ICD-10-CM | POA: Diagnosis present

## 2024-08-10 DIAGNOSIS — Z951 Presence of aortocoronary bypass graft: Secondary | ICD-10-CM

## 2024-08-10 DIAGNOSIS — Z87891 Personal history of nicotine dependence: Secondary | ICD-10-CM

## 2024-08-10 DIAGNOSIS — Z7901 Long term (current) use of anticoagulants: Secondary | ICD-10-CM

## 2024-08-10 DIAGNOSIS — D539 Nutritional anemia, unspecified: Secondary | ICD-10-CM | POA: Diagnosis present

## 2024-08-10 DIAGNOSIS — Z794 Long term (current) use of insulin: Secondary | ICD-10-CM

## 2024-08-10 DIAGNOSIS — J9811 Atelectasis: Secondary | ICD-10-CM | POA: Diagnosis present

## 2024-08-10 DIAGNOSIS — I5023 Acute on chronic systolic (congestive) heart failure: Principal | ICD-10-CM

## 2024-08-10 DIAGNOSIS — Z9981 Dependence on supplemental oxygen: Secondary | ICD-10-CM

## 2024-08-10 DIAGNOSIS — E785 Hyperlipidemia, unspecified: Secondary | ICD-10-CM | POA: Diagnosis present

## 2024-08-10 LAB — COMPREHENSIVE METABOLIC PANEL WITH GFR
ALT: 12 U/L (ref 0–44)
AST: 45 U/L — ABNORMAL HIGH (ref 15–41)
Albumin: 4.2 g/dL (ref 3.5–5.0)
Alkaline Phosphatase: 129 U/L — ABNORMAL HIGH (ref 38–126)
Anion gap: 11 (ref 5–15)
BUN: 37 mg/dL — ABNORMAL HIGH (ref 8–23)
CO2: 28 mmol/L (ref 22–32)
Calcium: 11.1 mg/dL — ABNORMAL HIGH (ref 8.9–10.3)
Chloride: 102 mmol/L (ref 98–111)
Creatinine, Ser: 1.67 mg/dL — ABNORMAL HIGH (ref 0.61–1.24)
GFR, Estimated: 42 mL/min — ABNORMAL LOW
Glucose, Bld: 168 mg/dL — ABNORMAL HIGH (ref 70–99)
Potassium: 4.7 mmol/L (ref 3.5–5.1)
Sodium: 141 mmol/L (ref 135–145)
Total Bilirubin: 1.9 mg/dL — ABNORMAL HIGH (ref 0.0–1.2)
Total Protein: 8.1 g/dL (ref 6.5–8.1)

## 2024-08-10 LAB — CBC
HCT: 26.1 % — ABNORMAL LOW (ref 39.0–52.0)
Hemoglobin: 8 g/dL — ABNORMAL LOW (ref 13.0–17.0)
MCH: 31.5 pg (ref 26.0–34.0)
MCHC: 30.7 g/dL (ref 30.0–36.0)
MCV: 102.8 fL — ABNORMAL HIGH (ref 80.0–100.0)
Platelets: 159 K/uL (ref 150–400)
RBC: 2.54 MIL/uL — ABNORMAL LOW (ref 4.22–5.81)
RDW: 18.7 % — ABNORMAL HIGH (ref 11.5–15.5)
WBC: 8.9 K/uL (ref 4.0–10.5)
nRBC: 0 % (ref 0.0–0.2)

## 2024-08-10 LAB — TROPONIN T, HIGH SENSITIVITY
Troponin T High Sensitivity: 67 ng/L — ABNORMAL HIGH (ref 0–19)
Troponin T High Sensitivity: 73 ng/L — ABNORMAL HIGH (ref 0–19)

## 2024-08-10 LAB — PRO BRAIN NATRIURETIC PEPTIDE: Pro Brain Natriuretic Peptide: 15340 pg/mL — ABNORMAL HIGH

## 2024-08-10 MED ORDER — FUROSEMIDE 10 MG/ML IJ SOLN
40.0000 mg | Freq: Once | INTRAMUSCULAR | Status: AC
  Administered 2024-08-10: 40 mg via INTRAVENOUS
  Filled 2024-08-10: qty 4

## 2024-08-10 MED ORDER — ATORVASTATIN CALCIUM 80 MG PO TABS
80.0000 mg | ORAL_TABLET | Freq: Every day | ORAL | Status: DC
  Administered 2024-08-10 – 2024-08-15 (×6): 80 mg via ORAL
  Filled 2024-08-10: qty 1
  Filled 2024-08-10 (×2): qty 4
  Filled 2024-08-10 (×3): qty 1

## 2024-08-10 MED ORDER — CARVEDILOL 3.125 MG PO TABS
3.1250 mg | ORAL_TABLET | Freq: Two times a day (BID) | ORAL | Status: DC
  Administered 2024-08-10 – 2024-08-15 (×10): 3.125 mg via ORAL
  Filled 2024-08-10 (×10): qty 1

## 2024-08-10 MED ORDER — AMLODIPINE BESYLATE 10 MG PO TABS
10.0000 mg | ORAL_TABLET | Freq: Every day | ORAL | Status: DC
  Administered 2024-08-10 – 2024-08-15 (×6): 10 mg via ORAL
  Filled 2024-08-10: qty 1
  Filled 2024-08-10: qty 2
  Filled 2024-08-10: qty 1
  Filled 2024-08-10: qty 2
  Filled 2024-08-10 (×2): qty 1

## 2024-08-10 MED ORDER — PANTOPRAZOLE SODIUM 40 MG PO TBEC
40.0000 mg | DELAYED_RELEASE_TABLET | Freq: Every day | ORAL | Status: AC
  Administered 2024-08-10 – 2024-08-15 (×6): 40 mg via ORAL
  Filled 2024-08-10 (×6): qty 1

## 2024-08-10 MED ORDER — ACETAMINOPHEN 325 MG PO TABS: 650.0000 mg | ORAL_TABLET | Freq: Four times a day (QID) | ORAL | Status: DC | PRN

## 2024-08-10 MED ORDER — EMPAGLIFLOZIN 10 MG PO TABS
10.0000 mg | ORAL_TABLET | Freq: Every day | ORAL | Status: DC
  Administered 2024-08-11 – 2024-08-15 (×5): 10 mg via ORAL
  Filled 2024-08-10 (×5): qty 1

## 2024-08-10 MED ORDER — ASPIRIN 81 MG PO TBEC
81.0000 mg | DELAYED_RELEASE_TABLET | Freq: Every day | ORAL | Status: AC
  Administered 2024-08-11 – 2024-08-15 (×5): 81 mg via ORAL
  Filled 2024-08-10 (×5): qty 1

## 2024-08-10 MED ORDER — ISOSORBIDE MONONITRATE ER 60 MG PO TB24
120.0000 mg | ORAL_TABLET | Freq: Every day | ORAL | Status: DC
  Administered 2024-08-10 – 2024-08-15 (×6): 120 mg via ORAL
  Filled 2024-08-10 (×6): qty 2

## 2024-08-10 MED ORDER — VITAMIN B-12 1000 MCG PO TABS
1000.0000 ug | ORAL_TABLET | Freq: Every day | ORAL | Status: DC
  Administered 2024-08-10 – 2024-08-15 (×6): 1000 ug via ORAL
  Filled 2024-08-10 (×2): qty 1
  Filled 2024-08-10: qty 2
  Filled 2024-08-10: qty 1
  Filled 2024-08-10: qty 2
  Filled 2024-08-10: qty 1

## 2024-08-10 MED ORDER — FUROSEMIDE 10 MG/ML IJ SOLN
60.0000 mg | Freq: Two times a day (BID) | INTRAMUSCULAR | Status: DC
  Administered 2024-08-10 – 2024-08-14 (×8): 60 mg via INTRAVENOUS
  Filled 2024-08-10: qty 8
  Filled 2024-08-10: qty 6
  Filled 2024-08-10: qty 8
  Filled 2024-08-10 (×5): qty 6

## 2024-08-10 MED ORDER — HEPARIN SODIUM (PORCINE) 5000 UNIT/ML IJ SOLN
5000.0000 [IU] | Freq: Three times a day (TID) | INTRAMUSCULAR | Status: DC
  Administered 2024-08-10 – 2024-08-15 (×14): 5000 [IU] via SUBCUTANEOUS
  Filled 2024-08-10 (×14): qty 1

## 2024-08-10 MED ORDER — ONDANSETRON HCL 4 MG/2ML IJ SOLN: 4.0000 mg | Freq: Four times a day (QID) | INTRAMUSCULAR | Status: AC | PRN

## 2024-08-10 MED ORDER — ASPIRIN 81 MG PO CHEW
324.0000 mg | CHEWABLE_TABLET | Freq: Once | ORAL | Status: AC
  Administered 2024-08-10: 324 mg via ORAL
  Filled 2024-08-10: qty 4

## 2024-08-10 MED ORDER — ACETAMINOPHEN 650 MG RE SUPP: 650.0000 mg | Freq: Four times a day (QID) | RECTAL | Status: DC | PRN

## 2024-08-10 MED ORDER — LIDOCAINE HCL (PF) 1 % IJ SOLN
10.0000 mL | Freq: Once | INTRAMUSCULAR | Status: AC
  Administered 2024-08-10: 10 mL via INTRADERMAL

## 2024-08-10 MED ORDER — ONDANSETRON HCL 4 MG PO TABS: 4.0000 mg | ORAL_TABLET | Freq: Four times a day (QID) | ORAL | Status: AC | PRN

## 2024-08-10 MED ORDER — SODIUM CHLORIDE 0.9% FLUSH
3.0000 mL | Freq: Two times a day (BID) | INTRAVENOUS | Status: AC
  Administered 2024-08-10 – 2024-08-15 (×11): 3 mL via INTRAVENOUS

## 2024-08-10 MED ORDER — FERROUS SULFATE 325 (65 FE) MG PO TABS
325.0000 mg | ORAL_TABLET | Freq: Every day | ORAL | Status: DC
  Administered 2024-08-11 – 2024-08-15 (×5): 325 mg via ORAL
  Filled 2024-08-10 (×5): qty 1

## 2024-08-10 NOTE — ED Provider Notes (Signed)
 "  [facility] Provider Note    Event Date/Time   First MD Initiated Contact with Patient 08/10/24 [phone]     (approximate)   History   Chest Pain   HPI  [name] is a 78 y.o. male past medical history significant for anemia, atrial fibrillation no longer on anticoagulation with Watchman device in place, CAD with prior CABG, aortic stenosis status post TAVR, hypertension, hyperlipidemia, CKD stage IV, pulmonary hypertension, hyperlipidemia, chronic 2 L of home oxygen use, who presents to the emergency department with chest pain, shortness of breath and not feeling well.  States that he has been feeling short of breath with ongoing chest pain.  Over the past day states that he is having significantly worsening chest pain and shortness of breath.  Chest pain throughout the night and he feels like he is having a sharp stabbing pain and pressure sensation to his chest.  States that he is unable to walk across the room with having significant shortness of breath and wheezing.  States that this feels similar to whenever he has gotten blood transfusions in the past.  Denies any blood in his stool or melena.  No longer on antiplatelets or anticoagulation.  Patient recently had PCI with stent placement in August.  On chart review patient was evaluated in the emergency department on 08/01/2024 -found to have bilateral pleural effusions.  Recent hospitalization requiring thoracentesis and multiple blood transfusions.  Patient had a hemoglobin of 7.6 and was given 1 unit PRBC and was reevaluated, symptoms have improved and he was feeling better at that time and was discharged home with outpatient follow-up.     Physical Exam   Triage Vital Signs: ED Triage Vitals  Encounter Vitals Group     BP 08/10/24 0712 (!) 181/106     Girls Systolic BP Percentile --      Girls Diastolic BP Percentile --      Boys Systolic BP Percentile --      Boys Diastolic BP Percentile --       Pulse Rate 08/10/24 0712 76     Resp 08/10/24 0712 20     Temp 08/10/24 0712 (!) 97.5 F (36.4 C)     Temp src --      SpO2 08/10/24 0712 (!) 86 %     Weight 08/10/24 0710 190 lb 0.6 oz (86.2 kg)     Height 08/10/24 0710 6' 0.5 (1.842 m)     Head Circumference --      Peak Flow --      Pain Score 08/10/24 0710 5     Pain Loc --      Pain Education --      Exclude from Growth Chart --     Most recent vital signs: Vitals:   08/10/24 0712 08/10/24 0953  BP: (!) 181/106 (!) 184/76  Pulse: 76 100  Resp: 20 20  Temp: (!) 97.5 F (36.4 C) 98.8 F (37.1 C)  SpO2: (!) 86% 95%    Physical Exam Constitutional:      General: He is in acute distress.     Appearance: He is well-developed. He is ill-appearing.  HENT:     Head: Atraumatic.  Eyes:     Conjunctiva/sclera: Conjunctivae normal.  Cardiovascular:     Rate and Rhythm: Regular rhythm.  Pulmonary:     Effort: Tachypnea and respiratory distress present.     Breath sounds: Wheezing and rales present.     Comments: 86%  on 2 L, increased to 3 L nasal cannula at 95% Abdominal:     Palpations: Abdomen is soft.     Tenderness: There is no abdominal tenderness.  Musculoskeletal:     Cervical back: Normal range of motion.     Right lower leg: Edema present.     Left lower leg: Edema present.  Skin:    General: Skin is warm.     Capillary Refill: Capillary refill takes less than 2 seconds.  Neurological:     General: No focal deficit present.     Mental Status: He is alert. Mental status is at baseline.     IMPRESSION / MDM / ASSESSMENT AND PLAN / ED COURSE  I reviewed the triage vital signs and the nursing notes.  Differential diagnosis including anemia, ACS, heart failure exacerbation, pneumonia, viral illness including COVID/influenza  EKG  I, Clotilda Punter, the attending physician, personally viewed and interpreted this ECG.  EKG with significant artifact.  Reading as atrial fibrillation however difficult to tell  but P waves appear to be present.  Significant artifact on prior EKGs as well.  QRS is wide at 170.  QTc 495.  No tachycardic or bradycardic dysrhythmias while on cardiac telemetry.  RADIOLOGY I independently reviewed imaging, my interpretation of imaging: Chest x-ray with findings concerning for pulmonary edema and pleural effusions.  LABS (all labs ordered are listed, but only abnormal results are displayed) Labs interpreted as -    Labs Reviewed  COMPREHENSIVE METABOLIC PANEL WITH GFR - Abnormal; Notable for the following components:      Result Value   Glucose, Bld 168 (*)    BUN 37 (*)    Creatinine, Ser 1.67 (*)    Calcium  11.1 (*)    AST 45 (*)    Alkaline Phosphatase 129 (*)    Total Bilirubin 1.9 (*)    GFR, Estimated 42 (*)    All other components within normal limits  CBC - Abnormal; Notable for the following components:   RBC 2.54 (*)    Hemoglobin 8.0 (*)    HCT 26.1 (*)    MCV 102.8 (*)    RDW 18.7 (*)    All other components within normal limits  TROPONIN T, HIGH SENSITIVITY - Abnormal; Notable for the following components:   Troponin T High Sensitivity 67 (*)    All other components within normal limits  URINALYSIS, ROUTINE W REFLEX MICROSCOPIC  PRO BRAIN NATRIURETIC PEPTIDE  CBG MONITORING, ED  TYPE AND SCREEN  TROPONIN T, HIGH SENSITIVITY     MDM  Patient with no significant leukocytosis.  Does have anemia with a hemoglobin of 8 but appears to be stable.  Discussed patient's case with his oncologist/hematologist Dr. Melanee who did not advise on blood transfusion or iron  transfusions at this time given concern for volume overload.  Creatinine appears to be at baseline with no significant electrolyte abnormality.  Mildly elevated T. bili but no abdominal pain or right upper quadrant abdominal tenderness have a low suspicion for gallbladder etiology.  Initial troponin is elevated at 67.  Plan for second troponin given his ongoing chest pain.  Concern for  acute on chronic heart failure exacerbation.  Given an extra dose of IV Lasix .  Given his acute hypoxia and significant symptomatic chest pain and shortness of breath and is no longer on Plavix  with elevated troponin will consult hospitalist service for further evaluation for chest pain, shortness of breath and hypoxia.     PROCEDURES:  Critical  Care performed: No  Procedures  Patient's presentation is most consistent with acute presentation with potential threat to life or bodily function.   MEDICATIONS ORDERED IN ED: Medications  aspirin  chewable tablet 324 mg (has no administration in time range)  furosemide  (LASIX ) injection 40 mg (40 mg Intravenous Given 08/10/24 0956)    FINAL CLINICAL IMPRESSION(S) / ED DIAGNOSES   Final diagnoses:  Acute on chronic systolic CHF (congestive heart failure) (HCC)  Anemia, unspecified type  Chest pain, unspecified type     Rx / DC Orders   ED Discharge Orders     None        Note:  This document was prepared using Dragon voice recognition software and may include unintentional dictation errors.   Suzanne Kirsch, MD 08/10/24 1017  "

## 2024-08-10 NOTE — ED Triage Notes (Addendum)
 Pt comes with c/o cp that started 3-4 days ago. Pt states he has also been weak and fatigued. Pt states weekly transfusions for anemia. Pt states no bleeding currently. Pt wears 2- [place] at home. Pt is pale and states he might need two bags of blood this time bc he pushed it a little too hard. Pt also has some labored breathing noted. Pt has hx of afib and not on thinner currently.  Pt also has bilateral leg and ankle swelling. Pt didn't take his lasix 

## 2024-08-10 NOTE — ED Notes (Signed)
 Pt increased to 3L and now up to 95%

## 2024-08-10 NOTE — H&P (Addendum)
 " History and Physical    Patient: [name] [name] DOB: September 07, 1945 DOA: 08/10/2024 DOS: the patient was seen and examined on 08/10/2024 PCP: [name], MD  Patient coming from: Home  Chief Complaint:  Chief Complaint  Patient presents with   Chest Pain   HPI: [name] is a 78 y.o. male with medical history significant of 78 y.o. 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  Discharged on 07/23/24 after presenting with respiratory symptoms secondary to recurrent right pleural effusion as well as symptomatic anemia.  He underwent a thoracentesis at that time and 450 cc of fluid was obtained.  CT chest, abdomen and pelvis demonstrated progressive mediastinal adenopathy suggestive of either chronic inflammatory process or low-grade lymphoproliferative process.  Subsequent cytology was negative for any malignancy.  He was discharged home on base level 4 L O2.  Because of the severity of pulmonary hypertension Watchman procedure has been delayed according to the patient.  He is currently not on anticoagulation due to recurrent GI bleeding and symptomatic anemia requiring transfusions.  His anemia is multifactorial secondary to iron  deficiency with chronic blood loss as well as RBC destruction in setting of TAVR.  He returns to the ED today complaints of significant exertional chest pain and dyspnea ongoing for greater than 1 week.  Also reports of increased lower extremity edema worse than baseline.  Of note patient reports he had not taken his diuretic or his normal medications  prior to presenting to the ED today.  Workup consistent with heart failure exacerbation noting elevated proBNP greater than 15,000, elevated troponin x 2 less than 100, and chest x-ray revealed moderate bilateral pleural effusions with mild pulmonary edema and bibasilar atelectasis or airspace disease.  Patient was concerned that his chronic anemia may be contributing to his respiratory symptoms and chest pain.  EDP did speak with patient's oncologist Dr. Melanee who also felt that symptoms are being precipitated by heart failure not by anemia.  Hospitalist service was asked to evaluate the patient for admission.  Cardiac History:              1.  CABG x 3             2.  2.5 x 18 mm Onyx frontier DES ostium SVG to PDA 2021 at [facility]             3.  Status post TAVR 01/23/2024 at [facility]             4.  3.5 x 23 mm Synergy in-stent restenosis ostium SVG to PDA at [facility] 03/20/2024             5.  Essential hypertension             6.  Hyperlipidemia             7.  Type 2 diabetes             8.  Chronic kidney disease stage IV followed by Dr. Colletta             8.  Chronic anemia followed by [facility]Rau at [facility]             9.  Pulmonary hypertension (PA mean 51 mmHg by RHC 07/01/2024) (AP)   Review of Systems: As mentioned in the history of present illness. All other systems reviewed and are negative.  Patient reports that has chronic bilateral lower extremity edema although left is worse post vein stripping for CABG procedure several years prior.  Reports has chronically slept up in chair but current symptoms have made it difficult to sleep even sitting upright.  No fever, chills or myalgias.  Past Medical History:  Diagnosis Date   (HFpEF) heart failure with preserved ejection fraction (HCC) 07/25/2016   a.)TTE 07/25/16: EF >55, triv-mild pan regur, mild AS, G2DD; b.)TTE 09/02/16: EF >55, triv TR, mild AS; c.)TTE 11/05/16: EF >55, LAE, triv-mild pan regur, mild AS, G2DD; d.)TTE 01/21/20: EF  >55, mod LVH, LAE/RVE, triv-mild AR/PR/TR, mod MR, mild AS, G2DD; e.)TTE 09/24/22: EF>55, sev LVH, sev LAE, RAE, mild PR, mod AR/MR/TR, mild-mod AS, RVSP 74.7; f.)TTE 12/23/22: EF 60-65, LAE, mild-mod MR/TR, mild AS   Abnormal urine 05/01/2023   Anemia    Aortic atherosclerosis    Aortic stenosis 07/25/2016   a.) TTE 07/25/2016: mild (MPG 13.5); b.) TTE 09/02/2016: mild (MPG 16); c.) TTE 11/05/2016: mild (MPG 9); d.) TTE 01/21/2020: mild (MPG 11.7); e.) TTE 09/24/2022: mild-mod (MPG 20.3); f.) TTE 12/23/2022: mild (MPG 15)   Atrial fibrillation (HCC)    a.) CHA2DS2VASc = 6 (age x2, CHF, HTN, vascular disease history, T2DM);  b.) rate/rhythm maintained on oral labetalol ; chronically anticoagulated with apixaban  + clopidogrel    AV block, 1st degree    B12 deficiency    CKD (chronic kidney disease), stage III (HCC)    Colon polyps    Congestive heart failure (HCC) 05/01/2023   Coronary artery disease 08/16/2016   a.) s/p 3v CABG 08/30/2016; b.) s/p PCI 11/26/2016 (DES x 1 oSVG-RCA); c.) s/p PCI 12/17/2016 (DES x 5 --> mLCx x2, dLCx, pLAD, dLAD); c.) s/p PCI 06/09/2020 (DES x1 --> ISR oSVG-PDA)   Cyst of kidney, acquired 07/03/2023   Dyspnea    Gastroesophageal reflux disease 05/01/2023   GERD (gastroesophageal reflux disease)    Heart murmur    Hepatic steatosis    History of 2019 novel coronavirus disease (COVID-19) 09/02/2020   a.) Tx'd with remdesivir    History of blood transfusion    History of GI bleed 09/02/2020   a.) admitted to [facility] 09/02/2020 - 09/08/2020   History of heart artery stent 11/26/2016   TOTAL of 7 stents: a.) 11/26/2016 --> 3.5 x 22 mm Resolute Integrity oSVG-RCA; b.) 12/17/2016: 3.0 x 12 mm Xience Alpine dLCx, 3.5 x 22 mm Resolute Onyx mLCx, 3.5 x 18 mm Resolute Onyx mLCx, 3.0 x 26 mm Resolute Onyx pLAD, 2.0 x 26 mm Resolute Onyx dLAD; c.) 06/09/2020 --> 2.5 x 18 mm Resolute Onyx ISR oSVG-PDA   History of partial colectomy 08/12/2012   a.) large gastric hyperplastic  polyp removal   HLD (hyperlipidemia)    Hypertension    LBBB (left bundle branch block)    Long term current use of anticoagulant    a.) apixaban    Long term current use of antithrombotics/antiplatelets    a.) clopidogrel    Male hypogonadism    a.) on exogenous TRT (1.62% gel)   Mild cardiomegaly    Mild gynecomastia (bilateral)    Nephrolithiasis    OSA (obstructive sleep apnea)    a.) does not utilize nocturnal PAP therapy  Personal history of surgery to heart and great vessels, presenting hazards to health 05/01/2023   Proteinuria, unspecified 08/01/2023   Pulmonary hypertension (HCC) 09/24/2022   a.) TTE 09/24/2022: RVSP 74.7; b.) TTE 12/23/2022: RVSP 31.3   RBBB (right bundle branch block with left anterior fascicular block)    Right inguinal hernia    Right thyroid  nodule    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   a.) LIMA-LAD, SVG-PDA, SVG-OM3   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/01/2023   Type 2 diabetes mellitus treated with insulin  (HCC)    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   Umbilical hernia    Unstable angina (HCC)    Vitamin D  deficiency    Past Surgical History:  Procedure Laterality Date   ANOMALOUS PULMONARY VENOUS RETURN REPAIR, TOTAL  2025   APPENDECTOMY     CARDIAC CATHETERIZATION Left 08/16/2016   Procedure: Left Heart Cath and Coronary Angiography;  Surgeon: Cara JONETTA Lovelace, MD;  Location: [facility] INVASIVE CV [facility];  Service: Cardiovascular;  Laterality: Left;   COLONOSCOPY N/A 09/07/2020   Procedure: COLONOSCOPY;  Surgeon: Janalyn Keene NOVAK, MD;  Location: [facility] ENDOSCOPY;  Service: Endoscopy;  Laterality: N/A;   COLONOSCOPY N/A 10/19/2023   Procedure: COLONOSCOPY;  Surgeon: Jinny Carmine, MD;  Location: [facility] ENDOSCOPY;  Service: Endoscopy;  Laterality: N/A;   COLONOSCOPY WITH PROPOFOL  N/A 05/17/2021   Procedure: COLONOSCOPY WITH PROPOFOL ;  Surgeon: Janalyn Keene NOVAK, MD;  Location: [facility] ENDOSCOPY;  Service:  Endoscopy;  Laterality: N/A;   CORONARY ANGIOPLASTY WITH STENT PLACEMENT Left 11/26/2016   Procedure: CORONARY ANGIOPLASTY WITH STENT PLACEMENT; Location: [facility]; Surgeon: Alm Dais, MD   CORONARY ARTERY BYPASS GRAFT N/A 08/30/2016   Procedure: CORONARY ARTERY BYPASS GRAFT; Location: [facility]; Surgeon: Juliene Pouch, MD   CORONARY STENT INTERVENTION N/A 06/09/2020   Procedure: CORONARY STENT INTERVENTION;  Surgeon: Lovelace Cara JONETTA, MD;  Location: [facility];  Service: Cardiovascular;  Laterality: N/A;   CORONARY STENT INTERVENTION Left 12/17/2016   Procedure: CORONARY STENT INTERVENTION; Location: [facility]; Surgeon: Alm Dais, MD   CORONARY STENT INTERVENTION  03/2024   ENDOSCOPIC MUCOSAL RESECTION N/A 09/22/2020   Procedure: ENDOSCOPIC MUCOSAL RESECTION;  Surgeon: [name] Aloha Raddle., MD;  Location: [facility] ENDOSCOPY;  Service: Gastroenterology;  Laterality: N/A;   ESOPHAGOGASTRODUODENOSCOPY N/A 09/07/2020   Procedure: ESOPHAGOGASTRODUODENOSCOPY (EGD);  Surgeon: Janalyn Keene NOVAK, MD;  Location: [facility] ENDOSCOPY;  Service: Endoscopy;  Laterality: N/A;   ESOPHAGOGASTRODUODENOSCOPY N/A 10/19/2023   Procedure: EGD (ESOPHAGOGASTRODUODENOSCOPY);  Surgeon: Jinny Carmine, MD;  Location: [facility] ENDOSCOPY;  Service: Endoscopy;  Laterality: N/A;   ESOPHAGOGASTRODUODENOSCOPY (EGD) WITH PROPOFOL  N/A 09/22/2020   Procedure: ESOPHAGOGASTRODUODENOSCOPY (EGD) WITH PROPOFOL ;  Surgeon: [name] Aloha Raddle., MD;  Location: [facility] ENDOSCOPY;  Service: Gastroenterology;  Laterality: N/A;   ESOPHAGOGASTRODUODENOSCOPY (EGD) WITH PROPOFOL  N/A 08/26/2023   Procedure: ESOPHAGOGASTRODUODENOSCOPY (EGD) WITH PROPOFOL ;  Surgeon: Jinny Carmine, MD;  Location: [facility] ENDOSCOPY;  Service: Endoscopy;  Laterality: N/A;   FRACTURE SURGERY     HEMOSTASIS CLIP PLACEMENT  09/22/2020   Procedure: HEMOSTASIS CLIP PLACEMENT;  Surgeon: [name] Aloha Raddle., MD;  Location: [facility] ENDOSCOPY;  Service: Gastroenterology;;   HEMOSTASIS  CONTROL  09/22/2020   Procedure: HEMOSTASIS CONTROL;  Surgeon: [name] Aloha Raddle., MD;  Location: [facility] ENDOSCOPY;  Service: Gastroenterology;;   INSERTION OF MESH  02/19/2023   Procedure: INSERTION OF MESH;  Surgeon: Desiderio Schanz, MD;  Location: [facility] ORS;  Service: General;;  umbilical   LAPAROSCOPIC PARTIAL RIGHT COLECTOMY Right 08/12/2012   Procedure: LAPAROSCOPIC PARTIAL  RIGHT COLECTOMY; Location: [facility]; Surgeon: Unknown Sharps, MD   LEFT HEART CATH AND CORONARY ANGIOGRAPHY Left 06/09/2020   Procedure: LEFT HEART CATH AND CORONARY ANGIOGRAPHY;  Surgeon: Florencio Cara BIRCH, MD;  Location: [facility];  Service: Cardiovascular;  Laterality: Left;   LEFT HEART CATH AND CORS/GRAFTS ANGIOGRAPHY Left 11/20/2016   Procedure: Left Heart Cath and Cors/Grafts Angiography;  Surgeon: Cara BIRCH Florencio, MD;  Location: [facility];  Service: Cardiovascular;  Laterality: Left;   POLYPECTOMY  09/22/2020   Procedure: POLYPECTOMY;  Surgeon: [name] Aloha Raddle., MD;  Location: [facility] ENDOSCOPY;  Service: Gastroenterology;;   POLYPECTOMY  10/19/2023   Procedure: POLYPECTOMY;  Surgeon: Jinny Carmine, MD;  Location: [facility] ENDOSCOPY;  Service: Endoscopy;;   RIGHT HEART CATH Right 07/01/2024   Procedure: RIGHT HEART CATH;  Surgeon: Ammon Blunt, MD;  Location: [facility];  Service: Cardiovascular;  Laterality: Right;   SUBMUCOSAL LIFTING INJECTION  09/22/2020   Procedure: SUBMUCOSAL LIFTING INJECTION;  Surgeon: [name] Aloha Raddle., MD;  Location: [facility] ENDOSCOPY;  Service: Gastroenterology;;   UMBILICAL HERNIA REPAIR N/A 02/19/2023   Procedure: HERNIA REPAIR UMBILICAL ADULT, open;  Surgeon: Desiderio Schanz, MD;  Location: [facility] ORS;  Service: General;  Laterality: N/A;   XI ROBOTIC ASSISTED INGUINAL HERNIA REPAIR WITH MESH Right 02/19/2023   Procedure: XI ROBOTIC ASSISTED INGUINAL HERNIA REPAIR WITH MESH;  Surgeon: Desiderio Schanz, MD;  Location: [facility] ORS;  Service: General;   Laterality: Right;   Social History:  reports that he quit smoking about 26 years ago. His smoking use included cigarettes. He has never been exposed to tobacco smoke. He has never used smokeless tobacco. He reports that he does not drink alcohol and does not use drugs.  Allergies[1]  Family History  Problem Relation Age of Onset   Hyperlipidemia Mother    Heart disease Mother    Hypertension Father     Prior to Admission medications  Medication Sig Start Date End Date Taking? Authorizing Provider  amLODipine  (NORVASC ) 10 MG tablet Take 10 mg by mouth daily. 03/23/24 03/23/25  [provider]  aspirin  EC 81 MG tablet Take 81 mg by mouth daily. 04/02/24 04/02/25  [provider]  atorvastatin  (LIPITOR ) 80 MG tablet Take 80 mg by mouth.  Take 80 mg by mouth in the morning. 12/21/23 12/20/24  [provider]  bisacodyl  (DULCOLAX) 5 MG EC tablet Take 5 mg by mouth as needed for moderate constipation.    [provider]  carvedilol  (COREG ) 3.125 MG tablet Take 1 tablet (3.125 mg total) by mouth 2 (two) times daily with a meal. 03/04/24   Caleen Qualia, MD  cyanocobalamin  (VITAMIN B12) 1000 MCG tablet Take 1,000 mcg by mouth daily.    [provider]  dexlansoprazole (DEXILANT) 60 MG capsule Take 1 capsule by mouth daily. 12/25/21   [provider]  empagliflozin  (JARDIANCE ) 10 MG TABS tablet Take 1 tablet (10 mg total) by mouth daily before breakfast. 08/29/23   Agbata, Tochukwu, MD  Ferrous Sulfate  (IRON ) 325 (65 Fe) MG TABS Take 1 tablet by mouth daily.    [provider]  isosorbide  mononitrate (IMDUR ) 120 MG 24 hr tablet Take 120 mg by mouth daily. 03/23/24 03/23/25  [provider]  lactulose  (CHRONULAC ) 10 GM/15ML solution Taking 15-20 cc by mouth 3 to 4 times a day for constiption Patient taking differently: Taking 15-20 cc by mouth 3 to 4 times a day for constiption as needed 10/08/23   Therisa Bi, MD  torsemide  (  DEMADEX ) 20  MG tablet Take 2 tablets (40 mg total) by mouth daily. 07/24/24   Marsa Edelman, DO    Physical Exam: Vitals:   08/10/24 0710 08/10/24 0712 08/10/24 0953  BP:  (!) 181/106 (!) 184/76  Pulse:  76 100  Resp:  20 20  Temp:  (!) 97.5 F (36.4 C) 98.8 F (37.1 C)  TempSrc:   Oral  SpO2:  (!) 86% 95%  Weight: 86.2 kg    Height: 6' 0.5 (1.842 m)     Constitutional: NAD, calm, comfortable; sitting up on side of the bed currently in no acute respiratory distress Respiratory: Bilateral lung sounds on posterior exam with diffuse fine crackles. Normal respiratory effort and no use accessory muscles at rest.  4 L oxygen. Cardiovascular: Regular rate and rhythm, no murmurs / rubs / gallops.  Bilateral lower extremity edema with left greater than right.  Right lower extremity 3+, left lower extremity 2+ 1+ pedal pulses noting difficult to palpate easily due to edema.   Abdomen: no tenderness, no masses palpated. No obvious hepatosplenomegaly. Bowel sounds positive.  Musculoskeletal: no clubbing / cyanosis. No joint deformity upper and lower extremities. Good ROM, no contractures. Normal muscle tone.  Skin: no rashes, lesions, ulcers. No induration-lower extremities with evidence of chronic edema i.e. hemosiderin changes Neurologic: CN 2-12 grossly intact. Sensation intact,  Strength 5/5 x all 4 extremities.  Psychiatric: Normal judgment and insight. Alert and oriented x 3. Normal mood.     Data Reviewed:  Sodium 141, potassium 4.7, BUN 37, creatinine 1.67, calcium  11.1, AST 45, total bilirubin 1.9 proBNP 15,340, troponin T 67 and 73  WBC 8900 differential not obtained, hemoglobin 8, MCV 102.8, platelets 159,000  Chest x-ray as documented above  Assessment and Plan: Acute exacerbation of HFpEF Severe pulmonary hypertension Severe MR Presented with symptoms consistent with exacerbation of heart failure with diagnostic data supporting such (elevated troponin, elevated proBNP, abnormal  chest x-ray) Recent diagnosis of severe pulmonary hypertension and awaiting outpatient initial pulmonary evaluation that was planned for Wednesday.  Have consulted pulmonologist Dr. Aleskerov this admission.  Continue nitrates. In regards to heart failure treatment, he has received IV Lasix  40 mg x 1.  Will increase to 60 mg IV every 12 hours given decreased GFR Daily weights with strict intake and output Resume home carvedilol , Norvasc  and Jardiance  Cardiology has been consulted  Recurrent right pleural effusion Mediastinal lymphadenopathy Has undergone multiple thoracentesis with symptom relief Unclear if pulmonary hypertension contributing to this physiology I have ordered ultrasound-guided thoracentesis this admission.  He has bilateral effusions so have asked for highest volume effusion to be tapped Cytology from previous thoracentesis earlier in the month was negative for malignancy  Chronic atrial fibrillation not a candidate for anticoagulation Currently rate controlled Not on anticoagulation secondary to prior GI bleeding-continue baby aspirin  Awaiting Watchman procedure but unable to undergo due to high right heart pressures secondary to severe pulmonary hypertension-hopefully can find a regimen to improve right heart pressures that will allow patient to undergo Watchman procedure  Chronic combined macrocytic anemia: Chronic iron  deficiency/anemia of CKD Chronic RBC destruction in setting of TAVR Hemoglobin stable at 8.0 Current platelets are also within normal limits Continue oral iron  supplementation  CKD stage IIIb Current creatinine 1.67 which is lower than baseline Follow creatinine closely with diuresis  CAD, history of CABG x 3, history of prior cardiac stent x 2 Hypertension HLD Patient experiencing exertional chest pain and dyspnea on exertion which is felt to be multifactorial secondary  to heart failure exacerbation, untreated pulmonary hypertension and otherwise  chronic hypoxemia Suspect mild elevation of troponin is more reflective of demand ischemia Cardiology has been consulted to assist with heart failure management as well as any recommendations regarding underlying CAD Continue baby aspirin , carvedilol  and statin Continue Norvasc  and Imdur  as above  OSA Proven CPAP intolerant  Physical deconditioning Multifactorial secondary to significant knee pain with ambulation as well as pulmonary deconditioning from chronic heart failure and pulmonary hypertension Treat underlying causes Continue PT but consideration was given to severe knee pain limiting patient's ability to participate    Advance Care Planning:   Code Status: Full Code   VTE prophylaxis: Subcutaneous heparin -follow platelets closely especially in context of known RBC destruction secondary to T bar  Consults: Cardiology, pulmonary medicine  Family Communication: Son at bedside  Severity of Illness: The appropriate patient status for this patient is OBSERVATION. Observation status is judged to be reasonable and necessary in order to provide the required intensity of service to ensure the patient's safety. The patient's presenting symptoms, physical exam findings, and initial radiographic and laboratory data in the context of their medical condition is felt to place them at decreased risk for further clinical deterioration. Furthermore, it is anticipated that the patient will be medically stable for discharge from the hospital within 2 midnights of admission.   Author: Isaiah Lever, NP 08/10/2024 10:57 AM  For on call review [email or web address].    This pt was seen and examined by this physician independently. Agree w/ above A&P.     [1] No Known Allergies  "

## 2024-08-10 NOTE — Procedures (Signed)
 PROCEDURE SUMMARY:  Successful US  guided left-sided thoracentesis. Yielded of serosanguinous fluid. Patient tolerated procedure well. No immediate complications. EBL = trace  Specimen sent for labs.  Post procedure chest X-ray without evidence of pneumothorax; official read pending.  Tiwan Schnitker CHRISTELLA Bal PA-C 08/10/2024 4:22 PM

## 2024-08-10 NOTE — Consult Note (Addendum)
 " [facility] CLINIC CARDIOLOGY CONSULT NOTE       Patient ID: [name] MRN: [id number] DOB/AGE: December 07, 1945 78 y.o.  Admit date: 08/10/2024 Referring Physician Isaiah Lever, NP Primary Physician [name], MD  Primary Cardiologist Dr. Ammon Reason for Consultation AoCHF  HPI: [name] is a 78 y.o. 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 Cardiology was consulted for further evaluation.   Patient reports shortness of breath that began a few days ago and has been gradually worsening.  Workup in the ED notable for creatinine 1.67, potassium 4.7, hemoglobin 8.0, WBC 8.9. Troponins 67 > 73, BNP 15,340. EKG in the ED atrial fibrillation rate 75 bpm. Moderate bilateral pleural effusions with mild pulmonary edema. Underwent thoracentesis today.   At the time of my evaluation this afternoon, he is sitting up on side of the stretcher eating dinner.  States that he is feeling slightly better at this time.  He did receive a dose of IV Lasix  earlier today and has diuresed well.  Also had thoracentesis with 700 cc removed.  Endorses shortness of breath and chest discomfort with exertion for a few days prior to admission.  This is similar to prior presentations.  Review of systems complete and found to be negative unless listed above    Past Medical History:  Diagnosis Date   (HFpEF) heart failure with preserved ejection fraction (HCC) 07/25/2016   a.)TTE 07/25/16: EF >55, triv-mild pan regur, mild AS, G2DD; b.)TTE 09/02/16: EF >55, triv TR, mild AS; c.)TTE 11/05/16: EF >55, LAE, triv-mild pan regur, mild AS, G2DD; d.)TTE 01/21/20: EF >55, mod LVH, LAE/RVE, triv-mild AR/PR/TR, mod MR,  mild AS, G2DD; e.)TTE 09/24/22: EF>55, sev LVH, sev LAE, RAE, mild PR, mod AR/MR/TR, mild-mod AS, RVSP 74.7; f.)TTE 12/23/22: EF 60-65, LAE, mild-mod MR/TR, mild AS   Abnormal urine 05/01/2023   Anemia    Aortic atherosclerosis    Aortic stenosis 07/25/2016   a.) TTE 07/25/2016: mild (MPG 13.5); b.) TTE 09/02/2016: mild (MPG 16); c.) TTE 11/05/2016: mild (MPG 9); d.) TTE 01/21/2020: mild (MPG 11.7); e.) TTE 09/24/2022: mild-mod (MPG 20.3); f.) TTE 12/23/2022: mild (MPG 15)   Atrial fibrillation (HCC)    a.) CHA2DS2VASc = 6 (age x2, CHF, HTN, vascular disease history, T2DM);  b.) rate/rhythm maintained on oral labetalol ; chronically anticoagulated with apixaban  + clopidogrel    AV block, 1st degree    B12 deficiency    CKD (chronic kidney disease), stage III (HCC)    Colon polyps    Congestive heart failure (HCC) 05/01/2023   Coronary artery disease 08/16/2016   a.) s/p 3v CABG 08/30/2016; b.) s/p PCI 11/26/2016 (DES x 1 oSVG-RCA); c.) s/p PCI 12/17/2016 (DES x 5 --> mLCx x2, dLCx, pLAD, dLAD); c.) s/p PCI 06/09/2020 (DES x1 --> ISR oSVG-PDA)   Cyst of kidney, acquired 07/03/2023   Dyspnea    Gastroesophageal reflux disease 05/01/2023   GERD (gastroesophageal reflux disease)    Heart murmur    Hepatic steatosis    History of 2019 novel coronavirus disease (COVID-19) 09/02/2020   a.) Tx'd with remdesivir    History of blood transfusion    History of GI bleed 09/02/2020   a.) admitted to [facility] 09/02/2020 - 09/08/2020   History of heart  artery stent 11/26/2016   TOTAL of 7 stents: a.) 11/26/2016 --> 3.5 x 22 mm Resolute Integrity oSVG-RCA; b.) 12/17/2016: 3.0 x 12 mm Xience Alpine dLCx, 3.5 x 22 mm Resolute Onyx mLCx, 3.5 x 18 mm Resolute Onyx mLCx, 3.0 x 26 mm Resolute Onyx pLAD, 2.0 x 26 mm Resolute Onyx dLAD; c.) 06/09/2020 --> 2.5 x 18 mm Resolute Onyx ISR oSVG-PDA   History of partial colectomy 08/12/2012   a.) large gastric hyperplastic polyp removal   HLD (hyperlipidemia)    Hypertension     LBBB (left bundle branch block)    Long term current use of anticoagulant    a.) apixaban    Long term current use of antithrombotics/antiplatelets    a.) clopidogrel    Male hypogonadism    a.) on exogenous TRT (1.62% gel)   Mild cardiomegaly    Mild gynecomastia (bilateral)    Nephrolithiasis    OSA (obstructive sleep apnea)    a.) does not utilize nocturnal PAP therapy   Personal history of surgery to heart and great vessels, presenting hazards to health 05/01/2023   Proteinuria, unspecified 08/01/2023   Pulmonary hypertension (HCC) 09/24/2022   a.) TTE 09/24/2022: RVSP 74.7; b.) TTE 12/23/2022: RVSP 31.3   RBBB (right bundle branch block with left anterior fascicular block)    Right inguinal hernia    Right thyroid  nodule    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   a.) LIMA-LAD, SVG-PDA, SVG-OM3   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/01/2023   Type 2 diabetes mellitus treated with insulin  (HCC)    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   Umbilical hernia    Unstable angina (HCC)    Vitamin D  deficiency     Past Surgical History:  Procedure Laterality Date   ANOMALOUS PULMONARY VENOUS RETURN REPAIR, TOTAL  2025   APPENDECTOMY     CARDIAC CATHETERIZATION Left 08/16/2016   Procedure: Left Heart Cath and Coronary Angiography;  Surgeon: Cara JONETTA Lovelace, MD;  Location: [facility] INVASIVE CV LAB;  Service: Cardiovascular;  Laterality: Left;   COLONOSCOPY N/A 09/07/2020   Procedure: COLONOSCOPY;  Surgeon: Janalyn Keene NOVAK, MD;  Location: [facility] ENDOSCOPY;  Service: Endoscopy;  Laterality: N/A;   COLONOSCOPY N/A 10/19/2023   Procedure: COLONOSCOPY;  Surgeon: Jinny Carmine, MD;  Location: [facility] ENDOSCOPY;  Service: Endoscopy;  Laterality: N/A;   COLONOSCOPY WITH PROPOFOL  N/A 05/17/2021   Procedure: COLONOSCOPY WITH PROPOFOL ;  Surgeon: Janalyn Keene NOVAK, MD;  Location: [facility] ENDOSCOPY;  Service: Endoscopy;  Laterality: N/A;   CORONARY ANGIOPLASTY WITH  STENT PLACEMENT Left 11/26/2016   Procedure: CORONARY ANGIOPLASTY WITH STENT PLACEMENT; Location: [facility]; Surgeon: Alm Dais, MD   CORONARY ARTERY BYPASS GRAFT N/A 08/30/2016   Procedure: CORONARY ARTERY BYPASS GRAFT; Location: [facility]; Surgeon: Juliene Pouch, MD   CORONARY STENT INTERVENTION N/A 06/09/2020   Procedure: CORONARY STENT INTERVENTION;  Surgeon: Lovelace Cara JONETTA, MD;  Location: [facility] CV [facility];  Service: Cardiovascular;  Laterality: N/A;   CORONARY STENT INTERVENTION Left 12/17/2016   Procedure: CORONARY STENT INTERVENTION; Location: [facility]; Surgeon: Alm Dais, MD   CORONARY STENT INTERVENTION  03/2024   ENDOSCOPIC MUCOSAL RESECTION N/A 09/22/2020   Procedure: ENDOSCOPIC MUCOSAL RESECTION;  Surgeon: [name] Aloha Raddle., MD;  Location: [facility] ENDOSCOPY;  Service: Gastroenterology;  Laterality: N/A;   ESOPHAGOGASTRODUODENOSCOPY N/A 09/07/2020   Procedure: ESOPHAGOGASTRODUODENOSCOPY (EGD);  Surgeon: Janalyn Keene NOVAK, MD;  Location: [facility] ENDOSCOPY;  Service: Endoscopy;  Laterality: N/A;   ESOPHAGOGASTRODUODENOSCOPY N/A 10/19/2023   Procedure: EGD (ESOPHAGOGASTRODUODENOSCOPY);  Surgeon: Jinny Carmine, MD;  Location: [facility] ENDOSCOPY;  Service: Endoscopy;  Laterality: N/A;   ESOPHAGOGASTRODUODENOSCOPY (EGD) WITH PROPOFOL  N/A 09/22/2020   Procedure: ESOPHAGOGASTRODUODENOSCOPY (EGD) WITH PROPOFOL ;  Surgeon: [name] Aloha Raddle., MD;  Location: [facility] ENDOSCOPY;  Service: Gastroenterology;  Laterality: N/A;   ESOPHAGOGASTRODUODENOSCOPY (EGD) WITH PROPOFOL  N/A 08/26/2023   Procedure: ESOPHAGOGASTRODUODENOSCOPY (EGD) WITH PROPOFOL ;  Surgeon: Jinny Carmine, MD;  Location: [facility] ENDOSCOPY;  Service: Endoscopy;  Laterality: N/A;   FRACTURE SURGERY     HEMOSTASIS CLIP PLACEMENT  09/22/2020   Procedure: HEMOSTASIS CLIP PLACEMENT;  Surgeon: [name] Aloha Raddle., MD;  Location: [facility] ENDOSCOPY;  Service: Gastroenterology;;   HEMOSTASIS CONTROL  09/22/2020   Procedure: HEMOSTASIS CONTROL;  Surgeon:  [name] Aloha Raddle., MD;  Location: [facility] ENDOSCOPY;  Service: Gastroenterology;;   INSERTION OF MESH  02/19/2023   Procedure: INSERTION OF MESH;  Surgeon: Desiderio Schanz, MD;  Location: [facility] ORS;  Service: General;;  umbilical   LAPAROSCOPIC PARTIAL RIGHT COLECTOMY Right 08/12/2012   Procedure: LAPAROSCOPIC PARTIAL RIGHT COLECTOMY; Location: [facility]; Surgeon: Unknown Sharps, MD   LEFT HEART CATH AND CORONARY ANGIOGRAPHY Left 06/09/2020   Procedure: LEFT HEART CATH AND CORONARY ANGIOGRAPHY;  Surgeon: Florencio Cara BIRCH, MD;  Location: [facility];  Service: Cardiovascular;  Laterality: Left;   LEFT HEART CATH AND CORS/GRAFTS ANGIOGRAPHY Left 11/20/2016   Procedure: Left Heart Cath and Cors/Grafts Angiography;  Surgeon: Cara BIRCH Florencio, MD;  Location: [facility];  Service: Cardiovascular;  Laterality: Left;   POLYPECTOMY  09/22/2020   Procedure: POLYPECTOMY;  Surgeon: [name] Aloha Raddle., MD;  Location: [facility] ENDOSCOPY;  Service: Gastroenterology;;   POLYPECTOMY  10/19/2023   Procedure: POLYPECTOMY;  Surgeon: Jinny Carmine, MD;  Location: [facility] ENDOSCOPY;  Service: Endoscopy;;   RIGHT HEART CATH Right 07/01/2024   Procedure: RIGHT HEART CATH;  Surgeon: Ammon Blunt, MD;  Location: [facility];  Service: Cardiovascular;  Laterality: Right;   SUBMUCOSAL LIFTING INJECTION  09/22/2020   Procedure: SUBMUCOSAL LIFTING INJECTION;  Surgeon: [name] Aloha Raddle., MD;  Location: [facility] ENDOSCOPY;  Service: Gastroenterology;;   UMBILICAL HERNIA REPAIR N/A 02/19/2023   Procedure: HERNIA REPAIR UMBILICAL ADULT, open;  Surgeon: Desiderio Schanz, MD;  Location: [facility] ORS;  Service: General;  Laterality: N/A;   XI ROBOTIC ASSISTED INGUINAL HERNIA REPAIR WITH MESH Right 02/19/2023   Procedure: XI ROBOTIC ASSISTED INGUINAL HERNIA REPAIR WITH MESH;  Surgeon: Desiderio Schanz, MD;  Location: [facility] ORS;  Service: General;  Laterality: Right;    (Not in a hospital admission)  Social History    Socioeconomic History   Marital status: Married    Spouse name: Not on file   Number of children: Not on file   Years of education: Not on file   Highest education level: Not on file  Occupational History   Not on file  Tobacco Use   Smoking status: Former    Current packs/day: 0.00    Types: Cigarettes    Quit date: 2000    Years since quitting: 26.0    Passive exposure: Never   Smokeless tobacco: Never  Vaping Use   Vaping status: Never Used  Substance and Sexual Activity   Alcohol use: No   Drug use: No   Sexual activity: Not Currently  Other Topics Concern   Not on file  Social History Narrative   Not on file   Social Drivers of Health   Tobacco Use: Medium Risk (08/10/2024)   Patient History    Smoking Tobacco Use: Former  Smokeless Tobacco Use: Never    Passive Exposure: Never  Financial Resource Strain: Low Risk  (05/08/2024)   Received from [facility]   Overall Financial Resource Strain (CARDIA)    Difficulty of Paying Living Expenses: Not hard at all  Food Insecurity: No Food Insecurity (07/18/2024)   Epic    Worried About Programme Researcher, Broadcasting/film/video in the Last Year: Never true    Ran Out of Food in the Last Year: Never true  Transportation Needs: No Transportation Needs (07/18/2024)   Epic    Lack of Transportation (Medical): No    Lack of Transportation (Non-Medical): No  Physical Activity: Not on file  Stress: Not on file  Social Connections: Moderately Isolated (07/18/2024)   Social Connection and Isolation Panel    Frequency of Communication with Friends and Family: More than three times a week    Frequency of Social Gatherings with Friends and Family: More than three times a week    Attends Religious Services: Never    Database Administrator or Organizations: No    Attends Banker Meetings: Never    Marital Status: Married  Catering Manager Violence: Not At Risk (07/18/2024)   Epic    Fear of Current or Ex-Partner: No     Emotionally Abused: No    Physically Abused: No    Sexually Abused: No  Depression (PHQ2-9): Low Risk (07/16/2024)   Depression (PHQ2-9)    PHQ-2 Score: 0  Alcohol Screen: Not on file  Housing: Low Risk (07/18/2024)   Epic    Unable to Pay for Housing in the Last Year: No    Number of Times Moved in the Last Year: 0    Homeless in the Last Year: No  Utilities: Not At Risk (07/18/2024)   Epic    Threatened with loss of utilities: No  Health Literacy: Not on file    Family History  Problem Relation Age of Onset   Hyperlipidemia Mother    Heart disease Mother    Hypertension Father      Vitals:   08/10/24 0953 08/10/24 1400 08/10/24 1422 08/10/24 1444  BP: (!) 184/76 (!) 163/65  (!) 159/60  Pulse: 100 64  66  Resp: 20 18    Temp: 98.8 F (37.1 C)  98.7 F (37.1 C)   TempSrc: Oral  Oral   SpO2: 95% 100%  98%  Weight:      Height:        PHYSICAL EXAM General: Chronically ill-appearing male, well nourished, in no acute distress. HEENT: Normocephalic and atraumatic. Neck: No JVD.  Lungs: Normal respiratory effort on 3L [place]. Clear bilaterally to auscultation. No wheezes, crackles, rhonchi.  Heart: HRRR. Normal S1 and S2 without gallops or murmurs.  Abdomen: Non-distended appearing.  Msk: Normal strength and tone for age. Extremities: Warm and well perfused. No clubbing, cyanosis.  3+ pitting edema.  Neuro: Alert and oriented X 3. Psych: Answers questions appropriately.   Labs: Basic Metabolic Panel: Recent Labs    08/10/24 0732  NA 141  K 4.7  CL 102  CO2 28  GLUCOSE 168*  BUN 37*  CREATININE 1.67*  CALCIUM  11.1*   Liver Function Tests: Recent Labs    08/10/24 0732  AST 45*  ALT 12  ALKPHOS 129*  BILITOT 1.9*  PROT 8.1  ALBUMIN 4.2   No results for input(s): LIPASE, AMYLASE in the last 72 hours. CBC: Recent Labs    08/10/24 0732  WBC 8.9  HGB 8.0*  HCT 26.1*  MCV 102.8*  PLT 159   Cardiac Enzymes: No results for input(s): CKTOTAL,  CKMB, CKMBINDEX, TROPONINIHS in the last 72 hours. BNP: No results for input(s): BNP in the last 72 hours. D-Dimer: No results for input(s): DDIMER in the last 72 hours. Hemoglobin A1C: No results for input(s): HGBA1C in the last 72 hours. Fasting Lipid Panel: No results for input(s): CHOL, HDL, LDLCALC, TRIG, CHOLHDL, LDLDIRECT in the last 72 hours. Thyroid  Function Tests: No results for input(s): TSH, T4TOTAL, T3FREE, THYROIDAB in the last 72 hours.  Invalid input(s): FREET3 Anemia Panel: No results for input(s): VITAMINB12, FOLATE, FERRITIN, TIBC, IRON , RETICCTPCT in the last 72 hours.   Radiology: DG Chest 2 View Result Date: 08/10/2024 EXAM: 2 VIEW(S) XRAY OF THE CHEST 08/10/2024 08:36:00 AM COMPARISON: 08/01/2024 CLINICAL HISTORY: dyspnea, cp FINDINGS: LUNGS AND PLEURA: Moderate bilateral pleural effusions. Mild pulmonary edema. Bibasilar atelectasis/airspace disease. No pneumothorax. HEART AND MEDIASTINUM: Stable CABG markers. TAVR noted. Aortic atherosclerosis. BONES AND SOFT TISSUES: Sternotomy wires noted. No acute osseous abnormality. IMPRESSION: 1. Moderate bilateral pleural effusions with mild pulmonary edema and bibasilar atelectasis/airspace disease. Electronically signed by: Waddell Calk MD 08/10/2024 08:41 AM EST RP Workstation: [id number]   DG Chest 1 View Result Date: 08/01/2024 EXAM: 1 VIEW(S) XRAY OF THE CHEST 08/01/2024 09:32:09 AM COMPARISON: 07/20/2024 CLINICAL HISTORY: SOB (shortness of breath) FINDINGS: LUNGS AND PLEURA: Bibasilar airspace opacities are stable. Mild pulmonary edema is present. Moderate right pleural effusion, slightly decreased. Similar small left pleural effusion. No pneumothorax. HEART AND MEDIASTINUM: Stable cardiomegaly. Aortic atherosclerotic calcification. Aortic valve replacement is noted. BONES AND SOFT TISSUES: Median sternotomy is noted. No acute osseous abnormality. IMPRESSION: 1. Moderate right  pleural effusion, slightly decreased, and similar small left pleural effusion. 2. Stable bibasilar airspace opacities and mild pulmonary edema. 3. Stable cardiomegaly and aortic atherosclerotic calcification. 4. Postsurgical changes of median sternotomy and aortic valve replacement. Electronically signed by: Evalene Coho MD 08/01/2024 09:37 AM EST RP Workstation: [id number]   CT CHEST ABDOMEN PELVIS WO CONTRAST Result Date: 07/20/2024 EXAM: CT CHEST, ABDOMEN AND PELVIS WITHOUT CONTRAST 07/20/2024 06:52:00 PM TECHNIQUE: CT of the chest, abdomen and pelvis was performed without the administration of intravenous contrast. Multiplanar reformatted images are provided for review. Automated exposure control, iterative reconstruction, and/or weight based adjustment of the mA/kV was utilized to reduce the radiation dose to as low as reasonably achievable. COMPARISON: 08/20/2023 CLINICAL HISTORY: LDH elevation. Oncology wanted to see if cancerous process. Okay for oral contrast but not intravenous. FINDINGS: CHEST: MEDIASTINUM AND LYMPH NODES: Status post transcatheter aortic valve replacement and coronary artery bypass grafting. Mild cardiomegaly. No pericardial effusion. The central pulmonary arteries are enlarged in keeping with changes of pulmonary arterial hypertension. Mild atherosclerotic calcification within the thoracic aorta. No aortic aneurysm. There is progressive pathologic mediastinal adenopathy. By example, previously measured left prevascular and precarinal lymph nodes now measure 12 mm (previously 10 mm) and 20 mm (previously 10 mm) in short axis diameter. The findings suggest either a chronic inflammatory or low-grade lymphoproliferative process. Stable 2.3 cm right thyroid  nodule, better assessed on thyroid  sonogram of 10/12/2020. No follow up imaging is recommended. LUNGS AND PLEURA: Small to moderate bilateral pleural effusions, right greater than left, appears similar to prior examination though  there is new pleural thickening bilaterally as well as probable septation noted at the right basilar pleural space which is nonspecific in the setting of repeated thoracentesis. Small focus of pleural gas on the right may relate to recent thoracentesis.  No focal consolidation or pulmonary edema. No pneumothorax. ABDOMEN AND PELVIS: LIVER: The liver is unremarkable. GALLBLADDER AND BILE DUCTS: Cholelithiasis without superimposed pericholecystic inflammatory change. No intra or extrahepatic biliary ductal dilation. SPLEEN: No acute abnormality. PANCREAS: No acute abnormality. ADRENAL GLANDS: No acute abnormality. KIDNEYS, URETERS AND BLADDER: Simple exophytic cortical cysts are seen within the kidneys bilaterally for which no follow up imaging is recommended. Of a hyperdense exophytic cortical cyst arises anteriorly but little lower pole of the right kidney but is unchanged from prior examination and was better characterized as a simple cyst on that exam. Per consensus, no follow-up is needed for simple Bosniak type 1 and 2 renal cysts, unless the patient has a malignancy history or risk factors. 7 mm nonobstructing calculus within the lower pole of the left kidney. The kidneys are otherwise unremarkable. No stones in the ureters. No hydronephrosis. No perinephric or periureteral stranding. Urinary bladder is unremarkable. GI AND BOWEL: Moderate stool burden within the distal colon and rectum without evidence of obstruction. The stomach, small bowel, and large bowel are otherwise unremarkable. Appendix absent. REPRODUCTIVE ORGANS: No acute abnormality. PERITONEUM AND RETROPERITONEUM: No ascites. No free air. VASCULATURE: Aorta is normal in caliber. Extensive aortic iliac atherosclerotic calcification. ABDOMINAL AND PELVIS LYMPH NODES: Pathologic aortic cable adenopathy with a single lymph node measuring 15 mm in short axis diameter is stable since prior examination, nonspecific. No additional pathologic adenopathy  within the abdomen and pelvis. REPRODUCTIVE ORGANS: No acute abnormality. BONES AND SOFT TISSUES: Osseous structures are age appropriate. No acute bone abnormality. No lytic or blastic bone lesion. No focal soft tissue abnormality. IMPRESSION: 1. Progressive pathologic mediastinal adenopathy, suggestive of a chronic inflammatory or low-grade lymphoproliferative process. 2. Small to moderate bilateral pleural effusions, right greater than left, with new bilateral pleural thickening and probable right basilar pleural space septation; small right pleural gas likely post-thoracentesis. 3. Stable pathologic aortocaval adenopathy measuring 15 mm in short axis, nonspecific. 4. Stable 2.3 cm right thyroid  nodule; no follow-up imaging is recommended. 5. Cholelithiasis without acute inflammatory change and without biliary ductal dilation. 6. Moderate stool burden in the distal colon and rectum without obstruction. 7. RAF score: Aortic Atherosclerosis (ICD10-I70.0). Electronically signed by: Dorethia Molt MD 07/20/2024 08:42 PM EST RP Workstation: [id number]   DG Chest Port 1 View Result Date: 07/20/2024 CLINICAL DATA:  Status post right thoracentesis. EXAM: PORTABLE CHEST 1 VIEW COMPARISON:  07/18/2024 FINDINGS: Small to moderate-sized right pleural effusion, decreased in size. No pneumothorax. No significant change in right basilar atelectasis and possible pneumonia. Stable small left pleural effusion. Increased linear and patchy density at the left lung base. Atheromatous aortic arch calcifications. Diffuse osteopenia. IMPRESSION: 1. Small to moderate-sized right pleural effusion, decreased in size following thoracentesis. 2. No pneumothorax. 3. No significant change in right basilar atelectasis and possible pneumonia. 4. Increased left basilar atelectasis and possible pneumonia. 5. Stable small left pleural effusion. Electronically Signed   By: Elspeth Bathe M.D.   On: 07/20/2024 14:34   US  THORACENTESIS ASP PLEURAL  SPACE W/IMG GUIDE Result Date: 07/20/2024 INDICATION: 78 year old male with an extensive cardiac history, including heart failure and aortic valve replacement in 01/2024, and severe pulmonary hypertension with recurrent right-sided pleural effusions. Request for diagnostic and therapeutic thoracentesis. EXAM: ULTRASOUND GUIDED DIAGNOSTIC AND THERAPEUTIC, RIGHT-SIDED THORACENTESIS MEDICATIONS: 1% lidocaine , 8 mL. COMPLICATIONS: None immediate. PROCEDURE: An ultrasound guided thoracentesis was thoroughly discussed with the patient and questions answered. The benefits, risks, alternatives and complications were also discussed. The patient understands and wishes to  proceed with the procedure. Written consent was obtained. Ultrasound was performed and was notable for several loculations throughout the right chest. An adequate pocket of fluid was identified in the right chest. The area was then prepped and draped in the normal sterile fashion. 1% Lidocaine  was used for local anesthesia. Under ultrasound guidance a 6 Fr Safe-T-Centesis catheter was introduced. Thoracentesis was performed. Unfortunately, due to the septations, only a small amount of fluid was able to be drained. Residual fluid noted to the right lateral chest on post procedure ultrasound. The catheter was removed and a dressing applied. FINDINGS: A total of approximately 450 mL of amber fluid was removed. Samples were sent to the laboratory as requested by the clinical team. IMPRESSION: Successful ultrasound guided right-sided thoracentesis yielding 450 mL of pleural fluid. Procedure performed by: Sherrilee Bal, PA-C under the supervision of Dr. ONEIDA Specking. Electronically Signed   By: CHRISTELLA.  Shick M.D.   On: 07/20/2024 11:14   DG Chest 2 View Result Date: 07/18/2024 CLINICAL DATA:  Chest pain and shortness of breath EXAM: CHEST - 2 VIEW COMPARISON:  07/02/2024 FINDINGS: Frontal and lateral views of the chest demonstrate stable postsurgical changes from  median sternotomy and aortic valve replacement. Cardiac silhouette is enlarged but stable. Persistent bibasilar veiling opacities consistent with consolidation and effusions, right greater than left. No pneumothorax. No acute bony abnormalities. IMPRESSION: 1. Stable bibasilar consolidation and effusions, right greater than left. 2. Stable enlarged cardiac silhouette. Electronically Signed   By: Ozell Daring M.D.   On: 07/18/2024 14:53    ECHO 03/2024: NORMAL LEFT VENTRICULAR SYSTOLIC FUNCTION WITH SEVERE LVH  ESTIMATED EF: >55%, CALC EF(3D): 55%  ELEVATED LA PRESSURES W/ DIASTOLIC DYSFUNCTION (GRADE 3)  NORMAL RIGHT VENTRICULAR SYSTOLIC FUNCTION  VALVULAR REGURGITATION: MILD AR, MILD MR, TRIVIAL PR, MILD TR  ESTIMATED RVSP: 85 mmHg (ABNORMAL)  VALVULAR STENOSIS: TRANSCATHETER BIOPROSTHETIC, No MS, No PS, No TS   TELEMETRY (personally reviewed): Not on telemetry  EKG (personally reviewed): atrial fibrillation rate 75 bpm  Data reviewed by me 08/10/2024: last 24h vitals tele labs imaging I/O ED provider note, admission H&P  Principal Problem:   Acute heart failure with preserved ejection fraction (HFpEF) (HCC)    ASSESSMENT AND PLAN:  [name] is a 78 y.o. 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 Cardiology was consulted for further evaluation.   # Acute hypoxic respiratory failure # Acute on chronic HFpEF # Bilateral pleural effusions # Coronary artery disease s/p CABG # Demand ischemia # Aortic stenosis s/p TAVR 01/2024 # Chronic anemia Patient presented to ED for evaluation of SOB, CP. BNP elevated at 15,340. Trops mild and flat 67 > 73. Chest x-ray with bilateral pleural  effusions. S/p thoracentesis 08/10/2024 with 700 cc removed.  -Continue IV lasix  60 mg twice daily.  -Continue home jardiance  10 mg daily, carvedilol  3.125 mg twice daily. Consider addition of MRA pending renal function.  -Continue aspirin  81 mg daily and atorvastatin  80 mg daily.  -Continue amlodipine  10 mg daily and imdur  120 mg daily.    This patient's plan of care was discussed and created with Dr. Wilburn and he is in agreement.  Signed: Danita Bloch, PA-C  08/10/2024, 3:41 PM [facility]      "

## 2024-08-11 DIAGNOSIS — J918 Pleural effusion in other conditions classified elsewhere: Secondary | ICD-10-CM | POA: Diagnosis present

## 2024-08-11 DIAGNOSIS — K760 Fatty (change of) liver, not elsewhere classified: Secondary | ICD-10-CM | POA: Diagnosis present

## 2024-08-11 DIAGNOSIS — Z7901 Long term (current) use of anticoagulants: Secondary | ICD-10-CM | POA: Diagnosis not present

## 2024-08-11 DIAGNOSIS — D631 Anemia in chronic kidney disease: Secondary | ICD-10-CM | POA: Diagnosis present

## 2024-08-11 DIAGNOSIS — E785 Hyperlipidemia, unspecified: Secondary | ICD-10-CM | POA: Diagnosis present

## 2024-08-11 DIAGNOSIS — Z794 Long term (current) use of insulin: Secondary | ICD-10-CM | POA: Diagnosis not present

## 2024-08-11 DIAGNOSIS — I2510 Atherosclerotic heart disease of native coronary artery without angina pectoris: Secondary | ICD-10-CM | POA: Diagnosis present

## 2024-08-11 DIAGNOSIS — J9621 Acute and chronic respiratory failure with hypoxia: Secondary | ICD-10-CM | POA: Diagnosis present

## 2024-08-11 DIAGNOSIS — D500 Iron deficiency anemia secondary to blood loss (chronic): Secondary | ICD-10-CM | POA: Diagnosis present

## 2024-08-11 DIAGNOSIS — Z7982 Long term (current) use of aspirin: Secondary | ICD-10-CM | POA: Diagnosis not present

## 2024-08-11 DIAGNOSIS — I4821 Permanent atrial fibrillation: Secondary | ICD-10-CM | POA: Diagnosis present

## 2024-08-11 DIAGNOSIS — I2489 Other forms of acute ischemic heart disease: Secondary | ICD-10-CM | POA: Diagnosis present

## 2024-08-11 DIAGNOSIS — E1122 Type 2 diabetes mellitus with diabetic chronic kidney disease: Secondary | ICD-10-CM | POA: Diagnosis present

## 2024-08-11 DIAGNOSIS — I272 Pulmonary hypertension, unspecified: Secondary | ICD-10-CM | POA: Diagnosis present

## 2024-08-11 DIAGNOSIS — Z8616 Personal history of COVID-19: Secondary | ICD-10-CM | POA: Diagnosis not present

## 2024-08-11 DIAGNOSIS — R627 Adult failure to thrive: Secondary | ICD-10-CM | POA: Diagnosis present

## 2024-08-11 DIAGNOSIS — N184 Chronic kidney disease, stage 4 (severe): Secondary | ICD-10-CM | POA: Diagnosis present

## 2024-08-11 DIAGNOSIS — I5031 Acute diastolic (congestive) heart failure: Secondary | ICD-10-CM | POA: Diagnosis not present

## 2024-08-11 DIAGNOSIS — G4733 Obstructive sleep apnea (adult) (pediatric): Secondary | ICD-10-CM | POA: Diagnosis present

## 2024-08-11 DIAGNOSIS — I5023 Acute on chronic systolic (congestive) heart failure: Secondary | ICD-10-CM | POA: Diagnosis present

## 2024-08-11 DIAGNOSIS — I340 Nonrheumatic mitral (valve) insufficiency: Secondary | ICD-10-CM | POA: Diagnosis present

## 2024-08-11 DIAGNOSIS — I130 Hypertensive heart and chronic kidney disease with heart failure and stage 1 through stage 4 chronic kidney disease, or unspecified chronic kidney disease: Secondary | ICD-10-CM | POA: Diagnosis present

## 2024-08-11 DIAGNOSIS — Z7902 Long term (current) use of antithrombotics/antiplatelets: Secondary | ICD-10-CM | POA: Diagnosis not present

## 2024-08-11 DIAGNOSIS — N1832 Chronic kidney disease, stage 3b: Secondary | ICD-10-CM | POA: Diagnosis present

## 2024-08-11 DIAGNOSIS — I5033 Acute on chronic diastolic (congestive) heart failure: Secondary | ICD-10-CM | POA: Diagnosis present

## 2024-08-11 DIAGNOSIS — J9811 Atelectasis: Secondary | ICD-10-CM | POA: Diagnosis present

## 2024-08-11 LAB — URINALYSIS, ROUTINE W REFLEX MICROSCOPIC
Bilirubin Urine: NEGATIVE
Glucose, UA: 150 mg/dL — AB
Ketones, ur: NEGATIVE mg/dL
Nitrite: NEGATIVE
Protein, ur: 100 mg/dL — AB
Specific Gravity, Urine: 1.011 (ref 1.005–1.030)
pH: 5 (ref 5.0–8.0)

## 2024-08-11 LAB — BASIC METABOLIC PANEL WITH GFR
Anion gap: 8 (ref 5–15)
BUN: 38 mg/dL — ABNORMAL HIGH (ref 8–23)
CO2: 31 mmol/L (ref 22–32)
Calcium: 10.2 mg/dL (ref 8.9–10.3)
Chloride: 102 mmol/L (ref 98–111)
Creatinine, Ser: 1.74 mg/dL — ABNORMAL HIGH (ref 0.61–1.24)
GFR, Estimated: 40 mL/min — ABNORMAL LOW
Glucose, Bld: 152 mg/dL — ABNORMAL HIGH (ref 70–99)
Potassium: 4.5 mmol/L (ref 3.5–5.1)
Sodium: 141 mmol/L (ref 135–145)

## 2024-08-11 NOTE — Consult Note (Signed)
 "    PULMONOLOGY         Date: 08/11/2024,   MRN# [id number] [name] 03/28/1946     AdmissionWeight: 86.2 kg                 CurrentWeight: 86.2 kg  Referring provider: Dr Trudy   CHIEF COMPLAINT:   Acute hypoxemic respiratory failure   HISTORY OF PRESENT ILLNESS   This is a 78 y.o. 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  Discharged on 07/23/24 after presenting with respiratory symptoms secondary to recurrent right pleural effusion as well as symptomatic anemia.  He underwent a thoracentesis at that time and 450 cc of fluid was obtained.  CT chest, abdomen and pelvis demonstrated progressive mediastinal adenopathy suggestive of either chronic inflammatory process or low-grade lymphoproliferative process.  Subsequent cytology was negative for any malignancy.  He was discharged home on base level 4 L O2.  Because of the severity of pulmonary hypertension Watchman procedure has been delayed according to the patient.  He is currently not on anticoagulation due to recurrent GI bleeding and symptomatic anemia requiring transfusions.  Patient presents to ER with chest discomfort and dyspnea x 1wk. Reports medication noncompliance.  Had chest imaging with bilateral pleural effusions. Cardiology has been consulted and has initiated GDMT.  IR has been consulted and has performed thoracentesis with 700cc removed from left pleural space. PCCM consultation placed for additional evaluation and management of patient with worsening pulmonary hypertension and hypoxemic respiratory failure.    PAST MEDICAL HISTORY    Past Medical History:  Diagnosis Date   (HFpEF) heart failure with preserved ejection fraction (HCC) 07/25/2016   a.)TTE 07/25/16: EF >55, triv-mild pan regur, mild AS, G2DD; b.)TTE 09/02/16: EF >55, triv TR, mild AS; c.)TTE 11/05/16: EF >55, LAE, triv-mild pan regur, mild AS, G2DD; d.)TTE 01/21/20: EF >55, mod LVH, LAE/RVE, triv-mild AR/PR/TR, mod MR, mild AS, G2DD; e.)TTE 09/24/22: EF>55, sev LVH, sev LAE, RAE, mild PR, mod AR/MR/TR, mild-mod AS, RVSP 74.7; f.)TTE 12/23/22: EF 60-65, LAE, mild-mod MR/TR, mild AS   Abnormal urine 05/01/2023   Anemia    Aortic atherosclerosis    Aortic stenosis 07/25/2016   a.) TTE 07/25/2016: mild (MPG 13.5); b.) TTE 09/02/2016: mild (MPG 16); c.) TTE 11/05/2016: mild (MPG 9); d.) TTE 01/21/2020: mild (MPG 11.7); e.) TTE 09/24/2022: mild-mod (MPG 20.3); f.) TTE 12/23/2022: mild (MPG 15)   Atrial fibrillation (HCC)    a.) CHA2DS2VASc = 6 (age x2, CHF, HTN, vascular disease history, T2DM);  b.) rate/rhythm maintained on oral labetalol ; chronically anticoagulated with apixaban  + clopidogrel    AV block, 1st degree    B12 deficiency    CKD (chronic kidney disease), stage III (HCC)    Colon polyps    Congestive heart failure (HCC) 05/01/2023   Coronary artery disease 08/16/2016   a.) s/p 3v CABG 08/30/2016; b.) s/p PCI 11/26/2016 (DES x 1 oSVG-RCA); c.) s/p PCI 12/17/2016 (DES x 5 --> mLCx x2, dLCx, pLAD, dLAD); c.) s/p PCI 06/09/2020 (DES x1 --> ISR oSVG-PDA)   Cyst of kidney, acquired 07/03/2023   Dyspnea    Gastroesophageal reflux disease 05/01/2023   GERD (gastroesophageal reflux  disease)    Heart murmur    Hepatic steatosis    History of 2019 novel coronavirus disease (COVID-19) 09/02/2020   a.) Tx'd with remdesivir    History of blood transfusion    History of GI bleed 09/02/2020   a.) admitted to [facility] 09/02/2020 - 09/08/2020   History of heart artery stent 11/26/2016   TOTAL of 7 stents: a.) 11/26/2016 --> 3.5 x 22 mm Resolute Integrity oSVG-RCA; b.)  12/17/2016: 3.0 x 12 mm Xience Alpine dLCx, 3.5 x 22 mm Resolute Onyx mLCx, 3.5 x 18 mm Resolute Onyx mLCx, 3.0 x 26 mm Resolute Onyx pLAD, 2.0 x 26 mm Resolute Onyx dLAD; c.) 06/09/2020 --> 2.5 x 18 mm Resolute Onyx ISR oSVG-PDA   History of partial colectomy 08/12/2012   a.) large gastric hyperplastic polyp removal   HLD (hyperlipidemia)    Hypertension    LBBB (left bundle branch block)    Long term current use of anticoagulant    a.) apixaban    Long term current use of antithrombotics/antiplatelets    a.) clopidogrel    Male hypogonadism    a.) on exogenous TRT (1.62% gel)   Mild cardiomegaly    Mild gynecomastia (bilateral)    Nephrolithiasis    OSA (obstructive sleep apnea)    a.) does not utilize nocturnal PAP therapy   Personal history of surgery to heart and great vessels, presenting hazards to health 05/01/2023   Proteinuria, unspecified 08/01/2023   Pulmonary hypertension (HCC) 09/24/2022   a.) TTE 09/24/2022: RVSP 74.7; b.) TTE 12/23/2022: RVSP 31.3   RBBB (right bundle branch block with left anterior fascicular block)    Right inguinal hernia    Right thyroid  nodule    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   a.) LIMA-LAD, SVG-PDA, SVG-OM3   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/01/2023   Type 2 diabetes mellitus treated with insulin  (HCC)    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   Umbilical hernia    Unstable angina (HCC)    Vitamin D  deficiency      SURGICAL HISTORY   Past Surgical History:  Procedure Laterality Date   ANOMALOUS PULMONARY VENOUS RETURN REPAIR, TOTAL  2025   APPENDECTOMY     CARDIAC CATHETERIZATION Left 08/16/2016   Procedure: Left Heart Cath and Coronary Angiography;  Surgeon: Cara JONETTA Lovelace, MD;  Location: [facility];  Service: Cardiovascular;  Laterality: Left;   COLONOSCOPY N/A 09/07/2020   Procedure: COLONOSCOPY;  Surgeon: Janalyn Keene NOVAK, MD;  Location: [facility] ENDOSCOPY;  Service: Endoscopy;   Laterality: N/A;   COLONOSCOPY N/A 10/19/2023   Procedure: COLONOSCOPY;  Surgeon: Jinny Carmine, MD;  Location: [facility] ENDOSCOPY;  Service: Endoscopy;  Laterality: N/A;   COLONOSCOPY WITH PROPOFOL  N/A 05/17/2021   Procedure: COLONOSCOPY WITH PROPOFOL ;  Surgeon: Janalyn Keene NOVAK, MD;  Location: [facility] ENDOSCOPY;  Service: Endoscopy;  Laterality: N/A;   CORONARY ANGIOPLASTY WITH STENT PLACEMENT Left 11/26/2016   Procedure: CORONARY ANGIOPLASTY WITH STENT PLACEMENT; Location: [facility]; Surgeon: Alm Dais, MD   CORONARY ARTERY BYPASS GRAFT N/A 08/30/2016   Procedure: CORONARY ARTERY BYPASS GRAFT; Location: [facility]; Surgeon: Juliene Pouch, MD   CORONARY STENT INTERVENTION N/A 06/09/2020   Procedure: CORONARY STENT INTERVENTION;  Surgeon: Lovelace Cara JONETTA, MD;  Location: [facility];  Service: Cardiovascular;  Laterality: N/A;   CORONARY STENT INTERVENTION Left 12/17/2016   Procedure: CORONARY STENT INTERVENTION; Location: [facility]; Surgeon: Alm Dais, MD   CORONARY STENT INTERVENTION  03/2024   ENDOSCOPIC MUCOSAL  RESECTION N/A 09/22/2020   Procedure: ENDOSCOPIC MUCOSAL RESECTION;  Surgeon: [name] Aloha Raddle., MD;  Location: [facility] ENDOSCOPY;  Service: Gastroenterology;  Laterality: N/A;   ESOPHAGOGASTRODUODENOSCOPY N/A 09/07/2020   Procedure: ESOPHAGOGASTRODUODENOSCOPY (EGD);  Surgeon: Janalyn Keene NOVAK, MD;  Location: [facility] ENDOSCOPY;  Service: Endoscopy;  Laterality: N/A;   ESOPHAGOGASTRODUODENOSCOPY N/A 10/19/2023   Procedure: EGD (ESOPHAGOGASTRODUODENOSCOPY);  Surgeon: Jinny Carmine, MD;  Location: [facility] ENDOSCOPY;  Service: Endoscopy;  Laterality: N/A;   ESOPHAGOGASTRODUODENOSCOPY (EGD) WITH PROPOFOL  N/A 09/22/2020   Procedure: ESOPHAGOGASTRODUODENOSCOPY (EGD) WITH PROPOFOL ;  Surgeon: [name] Aloha Raddle., MD;  Location: [facility] ENDOSCOPY;  Service: Gastroenterology;  Laterality: N/A;   ESOPHAGOGASTRODUODENOSCOPY (EGD) WITH PROPOFOL  N/A 08/26/2023   Procedure: ESOPHAGOGASTRODUODENOSCOPY (EGD)  WITH PROPOFOL ;  Surgeon: Jinny Carmine, MD;  Location: [facility] ENDOSCOPY;  Service: Endoscopy;  Laterality: N/A;   FRACTURE SURGERY     HEMOSTASIS CLIP PLACEMENT  09/22/2020   Procedure: HEMOSTASIS CLIP PLACEMENT;  Surgeon: [name] Aloha Raddle., MD;  Location: [facility] ENDOSCOPY;  Service: Gastroenterology;;   HEMOSTASIS CONTROL  09/22/2020   Procedure: HEMOSTASIS CONTROL;  Surgeon: [name] Aloha Raddle., MD;  Location: [facility] ENDOSCOPY;  Service: Gastroenterology;;   INSERTION OF MESH  02/19/2023   Procedure: INSERTION OF MESH;  Surgeon: Desiderio Schanz, MD;  Location: [facility] ORS;  Service: General;;  umbilical   LAPAROSCOPIC PARTIAL RIGHT COLECTOMY Right 08/12/2012   Procedure: LAPAROSCOPIC PARTIAL RIGHT COLECTOMY; Location: [facility]; Surgeon: Unknown Sharps, MD   LEFT HEART CATH AND CORONARY ANGIOGRAPHY Left 06/09/2020   Procedure: LEFT HEART CATH AND CORONARY ANGIOGRAPHY;  Surgeon: Florencio Cara BIRCH, MD;  Location: [facility];  Service: Cardiovascular;  Laterality: Left;   LEFT HEART CATH AND CORS/GRAFTS ANGIOGRAPHY Left 11/20/2016   Procedure: Left Heart Cath and Cors/Grafts Angiography;  Surgeon: Cara BIRCH Florencio, MD;  Location: [facility];  Service: Cardiovascular;  Laterality: Left;   POLYPECTOMY  09/22/2020   Procedure: POLYPECTOMY;  Surgeon: [name] Aloha Raddle., MD;  Location: [facility] ENDOSCOPY;  Service: Gastroenterology;;   POLYPECTOMY  10/19/2023   Procedure: POLYPECTOMY;  Surgeon: Jinny Carmine, MD;  Location: [facility] ENDOSCOPY;  Service: Endoscopy;;   RIGHT HEART CATH Right 07/01/2024   Procedure: RIGHT HEART CATH;  Surgeon: Ammon Blunt, MD;  Location: [facility];  Service: Cardiovascular;  Laterality: Right;   SUBMUCOSAL LIFTING INJECTION  09/22/2020   Procedure: SUBMUCOSAL LIFTING INJECTION;  Surgeon: [name] Aloha Raddle., MD;  Location: [facility] ENDOSCOPY;  Service: Gastroenterology;;   UMBILICAL HERNIA REPAIR N/A 02/19/2023   Procedure: HERNIA REPAIR UMBILICAL  ADULT, open;  Surgeon: Desiderio Schanz, MD;  Location: [facility] ORS;  Service: General;  Laterality: N/A;   XI ROBOTIC ASSISTED INGUINAL HERNIA REPAIR WITH MESH Right 02/19/2023   Procedure: XI ROBOTIC ASSISTED INGUINAL HERNIA REPAIR WITH MESH;  Surgeon: Desiderio Schanz, MD;  Location: [facility] OR[facility];  Service: General;  Laterality: Right;     FAMILY HISTORY   Family History  Problem Relation Age of Onset   Hyperlipidemia Mother    Heart disease Mother    Hypertension Father      SOCIAL HISTORY   Social History[1]   MEDICATIONS    Home Medication:  Current Outpatient Rx   Order #: [id number] Class: Historical Med   Order #: [id number] Class: Historical Med   Order #: [id number] Class: Historical Med   Order #: [id number] Class: Historical Med   Order #: [id number] Class: Normal   Order #: [id number] Class: Historical Med   Order #: [id number] Class: Historical Med   Order #: [id number] Class: Normal   Order #: [id number] Class: Historical Med  Order #: [id number] Class: Historical Med   Order #: [id number] Class: Normal   Order #: [id number] Class: Historical Med   Order #: [id number] Class: Normal   Order #: [id number] Class: Historical Med    Current Medication: Current Medications[2]    ALLERGIES   Patient has no known allergies.     REVIEW OF SYSTEMS    Review of Systems:  Gen:  Denies  fever, sweats, chills weigh loss  HEENT: Denies blurred vision, double vision, ear pain, eye pain, hearing loss, nose bleeds, sore throat Cardiac:  No dizziness, chest pain or heaviness, chest tightness,edema Resp:   reports dyspnea chronically  Gi: Denies swallowing difficulty, stomach pain, nausea or vomiting, diarrhea, constipation, bowel incontinence Gu:  Denies bladder incontinence, burning urine Ext:   Denies Joint pain, stiffness or swelling Skin: Denies  skin rash, easy bruising or bleeding or hives Endoc:  Denies polyuria, polydipsia , polyphagia or weight change Psych:   Denies depression,  insomnia or hallucinations   Other:  All other systems negative   VS: BP (!) 152/71 (BP Location: Left Arm)   Pulse 64   Temp 98.5 F (36.9 C) (Oral)   Resp 20   Ht 6' 0.5 (1.842 m)   Wt 86.2 kg   SpO2 100%   BMI 25.42 kg/m      PHYSICAL EXAM    GENERAL:NAD, no fevers, chills, no weakness no fatigue HEAD: Normocephalic, atraumatic.  EYES: Pupils equal, round, reactive to light. Extraocular muscles intact. No scleral icterus.  MOUTH: Moist mucosal membrane. Dentition intact. No abscess noted.  EAR, NOSE, THROAT: Clear without exudates. No external lesions.  NECK: Supple. No thyromegaly. No nodules. No JVD.  PULMONARY: decreased breath sounds with mild rhonchi worse at bases bilaterally.  CARDIOVASCULAR: S1 and S2. Regular rate and rhythm. No murmurs, rubs, or gallops. No edema. Pedal pulses 2+ bilaterally.  GASTROINTESTINAL: Soft, nontender, nondistended. No masses. Positive bowel sounds. No hepatosplenomegaly.  MUSCULOSKELETAL: No swelling, clubbing, or edema. Range of motion full in all extremities.  NEUROLOGIC: Cranial nerves II through XII are intact. No gross focal neurological deficits. Sensation intact. Reflexes intact.  SKIN: No ulceration, lesions, rashes, or cyanosis. Skin warm and dry. Turgor intact.  PSYCHIATRIC: Mood, affect within normal limits. The patient is awake, alert and oriented x 3. Insight, judgment intact.       IMAGING    Narrative & Impression EXAM: CT CHEST, ABDOMEN AND PELVIS WITHOUT CONTRAST 07/20/2024 06:52:00 PM   TECHNIQUE: CT of the chest, abdomen and pelvis was performed without the administration of intravenous contrast. Multiplanar reformatted images are provided for review. Automated exposure control, iterative reconstruction, and/or weight based adjustment of the mA/kV was utilized to reduce the radiation dose to as low as reasonably achievable.   COMPARISON: 08/20/2023   CLINICAL HISTORY: LDH elevation. Oncology wanted to  see if cancerous process. Okay for oral contrast but not intravenous.   FINDINGS:   CHEST: MEDIASTINUM AND LYMPH NODES: Status post transcatheter aortic valve replacement and coronary artery bypass grafting. Mild cardiomegaly. No pericardial effusion. The central pulmonary arteries are enlarged in keeping with changes of pulmonary arterial hypertension. Mild atherosclerotic calcification within the thoracic aorta. No aortic aneurysm. There is progressive pathologic mediastinal adenopathy. By example, previously measured left prevascular and precarinal lymph nodes now measure 12 mm (previously 10 mm) and 20 mm (previously 10 mm) in short axis diameter. The findings suggest either a chronic inflammatory or low-grade lymphoproliferative process. Stable 2.3 cm right thyroid  nodule, better assessed on thyroid  sonogram of 10/12/2020. No  follow up imaging is recommended.   LUNGS AND PLEURA: Small to moderate bilateral pleural effusions, right greater than left, appears similar to prior examination though there is new pleural thickening bilaterally as well as probable septation noted at the right basilar pleural space which is nonspecific in the setting of repeated thoracentesis. Small focus of pleural gas on the right may relate to recent thoracentesis. No focal consolidation or pulmonary edema. No pneumothorax.   ABDOMEN AND PELVIS: LIVER: The liver is unremarkable.   GALLBLADDER AND BILE DUCTS: Cholelithiasis without superimposed pericholecystic inflammatory change. No intra or extrahepatic biliary ductal dilation.   SPLEEN: No acute abnormality.   PANCREAS: No acute abnormality.   ADRENAL GLANDS: No acute abnormality.   KIDNEYS, URETERS AND BLADDER: Simple exophytic cortical cysts are seen within the kidneys bilaterally for which no follow up imaging is recommended. Of a hyperdense exophytic cortical cyst arises anteriorly but little lower pole of the right kidney but  is unchanged from prior examination and was better characterized as a simple cyst on that exam. Per consensus, no follow-up is needed for simple Bosniak type 1 and 2 renal cysts, unless the patient has a malignancy history or risk factors. 7 mm nonobstructing calculus within the lower pole of the left kidney. The kidneys are otherwise unremarkable. No stones in the ureters. No hydronephrosis. No perinephric or periureteral stranding. Urinary bladder is unremarkable.   GI AND BOWEL: Moderate stool burden within the distal colon and rectum without evidence of obstruction. The stomach, small bowel, and large bowel are otherwise unremarkable. Appendix absent.   REPRODUCTIVE ORGANS: No acute abnormality.   PERITONEUM AND RETROPERITONEUM: No ascites. No free air.   VASCULATURE: Aorta is normal in caliber. Extensive aortic iliac atherosclerotic calcification.   ABDOMINAL AND PELVIS LYMPH NODES: Pathologic aortic cable adenopathy with a single lymph node measuring 15 mm in short axis diameter is stable since prior examination, nonspecific. No additional pathologic adenopathy within the abdomen and pelvis.   REPRODUCTIVE ORGANS: No acute abnormality.   BONES AND SOFT TISSUES: Osseous structures are age appropriate. No acute bone abnormality. No lytic or blastic bone lesion. No focal soft tissue abnormality.   IMPRESSION: 1. Progressive pathologic mediastinal adenopathy, suggestive of a chronic inflammatory or low-grade lymphoproliferative process. 2. Small to moderate bilateral pleural effusions, right greater than left, with new bilateral pleural thickening and probable right basilar pleural space septation; small right pleural gas likely post-thoracentesis. 3. Stable pathologic aortocaval adenopathy measuring 15 mm in short axis, nonspecific. 4. Stable 2.3 cm right thyroid  nodule; no follow-up imaging is recommended. 5. Cholelithiasis without acute inflammatory change and  without biliary ductal dilation. 6. Moderate stool burden in the distal colon and rectum without obstruction. 7. RAF score: Aortic Atherosclerosis (ICD10-I70.0).   Electronically signed by: Dorethia Molt MD 07/20/2024 08:42 PM EST RP Workstation: [id number]   ASSESSMENT/PLAN   Acute hypoxemic respiratory failure    - due to Recurrent pleural effusions with compressive atelectasis    - acute on chronic heart failure    - Chronic renal failure    -possible lymphoproliferative process with mediastinal and aortocaval adenopathy   Acute on chronic CHF    - cardiology on case - appreciate input - Dr Wilburn and Junette RIGGERS   -Per cardiology continue with BID diuresis , Continue home jardiance  10 mg daily, carvedilol  3.125 mg twice daily. Consider addition of MRA pending renal function.  -Continue aspirin  81 mg daily and atorvastatin  80 mg daily.  -Continue amlodipine  10 mg daily and imdur   120 mg daily.  -hx of TAVF and AF Ref Range & Units (hover) 3 wk ago 1 mo ago  Pro Brain Natriuretic Peptide 13,853.0 High  12,167.0 High  CM     Bilateral pleural effusions - appreciate IR - s/p Throacentesis - Left side 700cc -pleural fluid studies- previous 07/20/24- negative culture/gram stain, negative cytology and flow cytometry, lymphocyte predominant. Elevated LDH (however patient on diuretics)  CKD 3B   - dc nonessential nephrotoxic medications  - contributing to fluid shift and hypoxemia   Rounded and compressiveatelectasis   - patient would benefit from chest physiotherapy   - metaneb with albuterol TID with RT  - PT/OT  - incentive spirometry    Pulmonary hypertension    Severe per RHC - 06/2024   Hemodynamic findings consistent with severe pulmonary hypertension.   1.         RA 15/15 mmHg            RV 87/21            PA 86/42  (51)            PCWP 45/24  (32)  -most likely due to Group 2 - cardiac related - Patient with severe MR -Currently NYHA 3-4 -agree with  diiuresis -supplemental O2 -optimization of underlying cardiac dysfunction -will consider additional PH medication after patient is more optimized from respiratory perspective    Thank you for allowing me to participate in the care of this patient.   Patient/Family are satisfied with care plan and all questions have been answered.    Provider disclosure: Patient with at least one acute or chronic illness or injury that poses a threat to life or bodily function and is being managed actively during this encounter.  All of the below services have been performed independently by signing provider:  review of prior documentation from internal and or external health records.  Review of previous and current lab results.  Interview and comprehensive assessment during patient visit today. Review of current and previous chest radiographs/CT scans. Discussion of management and test interpretation with health care team and patient/family.   This document was prepared using Dragon voice recognition software and may include unintentional dictation errors.     Aleksa Collinsworth, M.D.  Division of Pulmonary & Critical Care Medicine             [1]  Social History Tobacco Use   Smoking status: Former    Current packs/day: 0.00    Types: Cigarettes    Quit date: 2000    Years since quitting: 26.0    Passive exposure: Never   Smokeless tobacco: Never  Vaping Use   Vaping status: Never Used  Substance Use Topics   Alcohol use: No   Drug use: No  [2]  Current Facility-Administered Medications:    acetaminophen  (TYLENOL ) tablet 650 mg, 650 mg, Oral, Q6H PRN **OR** acetaminophen  (TYLENOL ) suppository 650 mg, 650 mg, Rectal, Q6H PRN, Alto Isaiah CROME, NP   amLODipine  (NORVASC ) tablet 10 mg, 10 mg, Oral, Daily, Alto Isaiah L, NP, 10 mg at 08/10/24 1152   aspirin  EC tablet 81 mg, 81 mg, Oral, Daily, Alto Isaiah CROME, NP   atorvastatin  (LIPITOR ) tablet 80 mg, 80 mg, Oral, Daily, Alto Isaiah CROME, NP, 80 mg at 08/10/24 1152   carvedilol  (COREG ) tablet 3.125 mg, 3.125 mg, Oral, BID WC, Alto Isaiah CROME, NP, 3.125 mg at 08/10/24 1607   cyanocobalamin  (VITAMIN B12) tablet 1,000 mcg, 1,000 mcg, Oral, Daily,  Alto Isaiah CROME, NP, 1,000 mcg at 08/10/24 1152   empagliflozin  (JARDIANCE ) tablet 10 mg, 10 mg, Oral, QAC breakfast, Alto Isaiah CROME, NP   ferrous sulfate  tablet 325 mg, 325 mg, Oral, Daily, Alto Isaiah CROME, NP   furosemide  (LASIX ) injection 60 mg, 60 mg, Intravenous, Q12H, Alto Isaiah CROME, NP, 60 mg at 08/10/24 1940   heparin  injection 5,000 Units, 5,000 Units, Subcutaneous, Q8H, Alto Isaiah CROME, NP, 5,000 Units at 08/10/24 2150   isosorbide  mononitrate (IMDUR ) 24 hr tablet 120 mg, 120 mg, Oral, Daily, Alto Isaiah CROME, NP, 120 mg at 08/10/24 1152   ondansetron  (ZOFRAN ) tablet 4 mg, 4 mg, Oral, Q6H PRN **OR** ondansetron  (ZOFRAN ) injection 4 mg, 4 mg, Intravenous, Q6H PRN, Alto Isaiah CROME, NP   pantoprazole  (PROTONIX ) EC tablet 40 mg, 40 mg, Oral, Daily, Alto Isaiah L, NP, 40 mg at 08/10/24 1152   sodium chloride  flush (NS) 0.9 % injection 3 mL, 3 mL, Intravenous, Q12H, Alto Isaiah CROME, NP, 3 mL at 08/10/24 2150  Current Outpatient Medications:    amLODipine  (NORVASC ) 10 MG tablet, Take 10 mg by mouth daily., Disp: , Rfl:    aspirin  EC 81 MG tablet, Take 81 mg by mouth daily., Disp: , Rfl:    atorvastatin  (LIPITOR ) 80 MG tablet, Take 80 mg by mouth.  Take 80 mg by mouth in the morning., Disp: , Rfl:    bisacodyl  (DULCOLAX) 5 MG EC tablet, Take 5 mg by mouth as needed for moderate constipation., Disp: , Rfl:    carvedilol  (COREG ) 3.125 MG tablet, Take 1 tablet (3.125 mg total) by mouth 2 (two) times daily with a meal., Disp: 60 tablet, Rfl: 1   cyanocobalamin  (VITAMIN B12) 1000 MCG tablet, Take 1,000 mcg by mouth daily., Disp: , Rfl:    dexlansoprazole (DEXILANT) 60 MG capsule, Take 1 capsule by mouth daily., Disp: , Rfl:    empagliflozin  (JARDIANCE ) 10 MG TABS tablet, Take 1  tablet (10 mg total) by mouth daily before breakfast., Disp: 30 tablet, Rfl: 0   Ferrous Sulfate  (IRON ) 325 (65 Fe) MG TABS, Take 1 tablet by mouth daily., Disp: , Rfl:    isosorbide  mononitrate (IMDUR ) 120 MG 24 hr tablet, Take 120 mg by mouth daily., Disp: , Rfl:    lactulose  (CHRONULAC ) 10 GM/15ML solution, Taking 15-20 cc by mouth 3 to 4 times a day for constiption (Patient taking differently: Taking 15-20 cc by mouth 3 to 4 times a day for constiption as needed), Disp: 946 mL, Rfl: 2   nitroGLYCERIN  (NITROSTAT ) 0.4 MG SL tablet, Place 0.4 mg under the tongue every 5 (five) minutes as needed for chest pain., Disp: , Rfl:    torsemide  (DEMADEX ) 20 MG tablet, Take 2 tablets (40 mg total) by mouth daily., Disp: 60 tablet, Rfl: 0   clopidogrel  (PLAVIX ) 75 MG tablet, Take 75 mg by mouth daily. (Patient not taking: Reported on 08/10/2024), Disp: , Rfl:   "

## 2024-08-11 NOTE — Progress Notes (Signed)
 " [facility] CARDIOLOGY PROGRESS NOTE       Patient ID: [name] MRN: [id number] DOB/AGE: 76-Dec-1947 76 y.o.  Admit date: 08/10/2024 Referring Physician Isaiah Lever, NP Primary Physician [name], MD  Primary Cardiologist Dr. Ammon Reason for Consultation AoCHF  HPI: [name] is a 78 y.o. 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 Cardiology was consulted for further evaluation.   Interval history: -Patient seen and examined this AM, sitting upright on side of hospital bed.  -States he feels about the same today. Ok at rest but with CP and SOB when he gets up and exerts himself, overall stable.  -BP and HR stable, renal function stable.   Review of systems complete and found to be negative unless listed above    Past Medical History:  Diagnosis Date   (HFpEF) heart failure with preserved ejection fraction (HCC) 07/25/2016   a.)TTE 07/25/16: EF >55, triv-mild pan regur, mild AS, G2DD; b.)TTE 09/02/16: EF >55, triv TR, mild AS; c.)TTE 11/05/16: EF >55, LAE, triv-mild pan regur, mild AS, G2DD; d.)TTE 01/21/20: EF >55, mod LVH, LAE/RVE, triv-mild AR/PR/TR, mod MR, mild AS, G2DD; e.)TTE 09/24/22: EF>55, sev LVH, sev LAE, RAE, mild PR, mod AR/MR/TR, mild-mod AS, RVSP 74.7; f.)TTE 12/23/22: EF 60-65, LAE, mild-mod MR/TR, mild AS   Abnormal urine 05/01/2023   Anemia    Aortic atherosclerosis    Aortic stenosis 07/25/2016   a.) TTE 07/25/2016: mild (MPG 13.5); b.) TTE 09/02/2016: mild (MPG 16); c.) TTE 11/05/2016: mild (MPG 9); d.) TTE 01/21/2020: mild (MPG 11.7); e.) TTE 09/24/2022: mild-mod (MPG 20.3); f.) TTE 12/23/2022: mild (MPG 15)   Atrial fibrillation (HCC)     a.) CHA2DS2VASc = 6 (age x2, CHF, HTN, vascular disease history, T2DM);  b.) rate/rhythm maintained on oral labetalol ; chronically anticoagulated with apixaban  + clopidogrel    AV block, 1st degree    B12 deficiency    CKD (chronic kidney disease), stage III (HCC)    Colon polyps    Congestive heart failure (HCC) 05/01/2023   Coronary artery disease 08/16/2016   a.) s/p 3v CABG 08/30/2016; b.) s/p PCI 11/26/2016 (DES x 1 oSVG-RCA); c.) s/p PCI 12/17/2016 (DES x 5 --> mLCx x2, dLCx, pLAD, dLAD); c.) s/p PCI 06/09/2020 (DES x1 --> ISR oSVG-PDA)   Cyst of kidney, acquired 07/03/2023   Dyspnea    Gastroesophageal reflux disease 05/01/2023   GERD (gastroesophageal reflux disease)    Heart murmur    Hepatic steatosis    History of 2019 novel coronavirus disease (COVID-19) 09/02/2020   a.) Tx'd with remdesivir    History of blood transfusion    History of GI bleed 09/02/2020   a.) admitted to [facility] 09/02/2020 - 09/08/2020   History of heart artery stent 11/26/2016   TOTAL of 7 stents: a.) 11/26/2016 --> 3.5 x 22 mm Resolute Integrity oSVG-RCA; b.) 12/17/2016: 3.0 x 12 mm Xience Alpine dLCx, 3.5 x 22 mm Resolute Onyx mLCx, 3.5 x 18 mm Resolute Onyx mLCx, 3.0 x 26 mm Resolute Onyx pLAD, 2.0 x 26 mm Resolute Onyx dLAD; c.) 06/09/2020 --> 2.5 x 18 mm Resolute Onyx ISR oSVG-PDA   History of partial colectomy 08/12/2012   a.) large gastric hyperplastic polyp removal   HLD (hyperlipidemia)  Hypertension    LBBB (left bundle branch block)    Long term current use of anticoagulant    a.) apixaban    Long term current use of antithrombotics/antiplatelets    a.) clopidogrel    Male hypogonadism    a.) on exogenous TRT (1.62% gel)   Mild cardiomegaly    Mild gynecomastia (bilateral)    Nephrolithiasis    OSA (obstructive sleep apnea)    a.) does not utilize nocturnal PAP therapy   Personal history of surgery to heart and great vessels, presenting hazards to health 05/01/2023   Proteinuria,  unspecified 08/01/2023   Pulmonary hypertension (HCC) 09/24/2022   a.) TTE 09/24/2022: RVSP 74.7; b.) TTE 12/23/2022: RVSP 31.3   RBBB (right bundle branch block with left anterior fascicular block)    Right inguinal hernia    Right thyroid  nodule    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   a.) LIMA-LAD, SVG-PDA, SVG-OM3   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/01/2023   Type 2 diabetes mellitus treated with insulin  (HCC)    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   Umbilical hernia    Unstable angina (HCC)    Vitamin D  deficiency     Past Surgical History:  Procedure Laterality Date   ANOMALOUS PULMONARY VENOUS RETURN REPAIR, TOTAL  2025   APPENDECTOMY     CARDIAC CATHETERIZATION Left 08/16/2016   Procedure: Left Heart Cath and Coronary Angiography;  Surgeon: Cara JONETTA Lovelace, MD;  Location: [facility] INVASIVE CV LAB;  Service: Cardiovascular;  Laterality: Left;   COLONOSCOPY N/A 09/07/2020   Procedure: COLONOSCOPY;  Surgeon: Janalyn Keene NOVAK, MD;  Location: [facility] ENDOSCOPY;  Service: Endoscopy;  Laterality: N/A;   COLONOSCOPY N/A 10/19/2023   Procedure: COLONOSCOPY;  Surgeon: Jinny Carmine, MD;  Location: [facility] ENDOSCOPY;  Service: Endoscopy;  Laterality: N/A;   COLONOSCOPY WITH PROPOFOL  N/A 05/17/2021   Procedure: COLONOSCOPY WITH PROPOFOL ;  Surgeon: Janalyn Keene NOVAK, MD;  Location: [facility] ENDOSCOPY;  Service: Endoscopy;  Laterality: N/A;   CORONARY ANGIOPLASTY WITH STENT PLACEMENT Left 11/26/2016   Procedure: CORONARY ANGIOPLASTY WITH STENT PLACEMENT; Location: [facility]; Surgeon: Alm Dais, MD   CORONARY ARTERY BYPASS GRAFT N/A 08/30/2016   Procedure: CORONARY ARTERY BYPASS GRAFT; Location: [facility]; Surgeon: Juliene Pouch, MD   CORONARY STENT INTERVENTION N/A 06/09/2020   Procedure: CORONARY STENT INTERVENTION;  Surgeon: Lovelace Cara JONETTA, MD;  Location: [facility];  Service: Cardiovascular;  Laterality: N/A;   CORONARY STENT INTERVENTION Left  12/17/2016   Procedure: CORONARY STENT INTERVENTION; Location: [facility]; Surgeon: Alm Dais, MD   CORONARY STENT INTERVENTION  03/2024   ENDOSCOPIC MUCOSAL RESECTION N/A 09/22/2020   Procedure: ENDOSCOPIC MUCOSAL RESECTION;  Surgeon: [name] Aloha Raddle., MD;  Location: [facility] ENDOSCOPY;  Service: Gastroenterology;  Laterality: N/A;   ESOPHAGOGASTRODUODENOSCOPY N/A 09/07/2020   Procedure: ESOPHAGOGASTRODUODENOSCOPY (EGD);  Surgeon: Janalyn Keene NOVAK, MD;  Location: [facility] ENDOSCOPY;  Service: Endoscopy;  Laterality: N/A;   ESOPHAGOGASTRODUODENOSCOPY N/A 10/19/2023   Procedure: EGD (ESOPHAGOGASTRODUODENOSCOPY);  Surgeon: Jinny Carmine, MD;  Location: [facility] ENDOSCOPY;  Service: Endoscopy;  Laterality: N/A;   ESOPHAGOGASTRODUODENOSCOPY (EGD) WITH PROPOFOL  N/A 09/22/2020   Procedure: ESOPHAGOGASTRODUODENOSCOPY (EGD) WITH PROPOFOL ;  Surgeon: [name] Aloha Raddle., MD;  Location: [facility] ENDOSCOPY;  Service: Gastroenterology;  Laterality: N/A;   ESOPHAGOGASTRODUODENOSCOPY (EGD) WITH PROPOFOL  N/A 08/26/2023   Procedure: ESOPHAGOGASTRODUODENOSCOPY (EGD) WITH PROPOFOL ;  Surgeon: Jinny Carmine, MD;  Location: [facility] ENDOSCOPY;  Service: Endoscopy;  Laterality: N/A;   FRACTURE SURGERY     HEMOSTASIS CLIP PLACEMENT  09/22/2020  Procedure: HEMOSTASIS CLIP PLACEMENT;  Surgeon: [name] Aloha Raddle., MD;  Location: [facility] ENDOSCOPY;  Service: Gastroenterology;;   HEMOSTASIS CONTROL  09/22/2020   Procedure: HEMOSTASIS CONTROL;  Surgeon: [name] Aloha Raddle., MD;  Location: [facility] ENDOSCOPY;  Service: Gastroenterology;;   INSERTION OF MESH  02/19/2023   Procedure: INSERTION OF MESH;  Surgeon: Desiderio Schanz, MD;  Location: [facility] ORS;  Service: General;;  umbilical   LAPAROSCOPIC PARTIAL RIGHT COLECTOMY Right 08/12/2012   Procedure: LAPAROSCOPIC PARTIAL RIGHT COLECTOMY; Location: [facility]; Surgeon: Unknown Sharps, MD   LEFT HEART CATH AND CORONARY ANGIOGRAPHY Left 06/09/2020   Procedure: LEFT HEART CATH AND CORONARY ANGIOGRAPHY;   Surgeon: Florencio Cara BIRCH, MD;  Location: [facility];  Service: Cardiovascular;  Laterality: Left;   LEFT HEART CATH AND CORS/GRAFTS ANGIOGRAPHY Left 11/20/2016   Procedure: Left Heart Cath and Cors/Grafts Angiography;  Surgeon: Cara BIRCH Florencio, MD;  Location: [facility];  Service: Cardiovascular;  Laterality: Left;   POLYPECTOMY  09/22/2020   Procedure: POLYPECTOMY;  Surgeon: [name] Aloha Raddle., MD;  Location: [facility] ENDOSCOPY;  Service: Gastroenterology;;   POLYPECTOMY  10/19/2023   Procedure: POLYPECTOMY;  Surgeon: Jinny Carmine, MD;  Location: [facility] ENDOSCOPY;  Service: Endoscopy;;   RIGHT HEART CATH Right 07/01/2024   Procedure: RIGHT HEART CATH;  Surgeon: Ammon Blunt, MD;  Location: [facility];  Service: Cardiovascular;  Laterality: Right;   SUBMUCOSAL LIFTING INJECTION  09/22/2020   Procedure: SUBMUCOSAL LIFTING INJECTION;  Surgeon: [name] Aloha Raddle., MD;  Location: [facility] ENDOSCOPY;  Service: Gastroenterology;;   UMBILICAL HERNIA REPAIR N/A 02/19/2023   Procedure: HERNIA REPAIR UMBILICAL ADULT, open;  Surgeon: Desiderio Schanz, MD;  Location: [facility] ORS;  Service: General;  Laterality: N/A;   XI ROBOTIC ASSISTED INGUINAL HERNIA REPAIR WITH MESH Right 02/19/2023   Procedure: XI ROBOTIC ASSISTED INGUINAL HERNIA REPAIR WITH MESH;  Surgeon: Desiderio Schanz, MD;  Location: [facility] ORS;  Service: General;  Laterality: Right;    (Not in a hospital admission)  Social History   Socioeconomic History   Marital status: Married    Spouse name: Not on file   Number of children: Not on file   Years of education: Not on file   Highest education level: Not on file  Occupational History   Not on file  Tobacco Use   Smoking status: Former    Current packs/day: 0.00    Types: Cigarettes    Quit date: 2000    Years since quitting: 26.0    Passive exposure: Never   Smokeless tobacco: Never  Vaping Use   Vaping status: Never Used  Substance and Sexual  Activity   Alcohol use: No   Drug use: No   Sexual activity: Not Currently  Other Topics Concern   Not on file  Social History Narrative   Not on file   Social Drivers of Health   Tobacco Use: Medium Risk (08/10/2024)   Patient History    Smoking Tobacco Use: Former    Smokeless Tobacco Use: Never    Passive Exposure: Never  Physicist, Medical Strain: Low Risk  (05/08/2024)   Received from [facility]   Overall Financial Resource Strain (CARDIA)    Difficulty of Paying Living Expenses: Not hard at all  Food Insecurity: No Food Insecurity (07/18/2024)   Epic    Worried About Radiation Protection Practitioner of Food in the Last Year: Never true    Ran Out of Food in the Last Year: Never true  Transportation Needs: No Transportation Needs (07/18/2024)  Epic    Lack of Transportation (Medical): No    Lack of Transportation (Non-Medical): No  Physical Activity: Not on file  Stress: Not on file  Social Connections: Moderately Isolated (07/18/2024)   Social Connection and Isolation Panel    Frequency of Communication with Friends and Family: More than three times a week    Frequency of Social Gatherings with Friends and Family: More than three times a week    Attends Religious Services: Never    Database Administrator or Organizations: No    Attends Banker Meetings: Never    Marital Status: Married  Catering Manager Violence: Not At Risk (07/18/2024)   Epic    Fear of Current or Ex-Partner: No    Emotionally Abused: No    Physically Abused: No    Sexually Abused: No  Depression (PHQ2-9): Low Risk (07/16/2024)   Depression (PHQ2-9)    PHQ-2 Score: 0  Alcohol Screen: Not on file  Housing: Low Risk (07/18/2024)   Epic    Unable to Pay for Housing in the Last Year: No    Number of Times Moved in the Last Year: 0    Homeless in the Last Year: No  Utilities: Not At Risk (07/18/2024)   Epic    Threatened with loss of utilities: No  Health Literacy: Not on file     Family History  Problem Relation Age of Onset   Hyperlipidemia Mother    Heart disease Mother    Hypertension Father      Vitals:   08/11/24 0730 08/11/24 0750 08/11/24 0923 08/11/24 0954  BP: (!) 157/68 (!) 170/74 (!) 153/65   Pulse: 66 68    Resp: 16     Temp:    97.7 F (36.5 C)  TempSrc:    Oral  SpO2: 100%     Weight:      Height:        PHYSICAL EXAM General: Chronically ill-appearing male, well nourished, in no acute distress. HEENT: Normocephalic and atraumatic. Neck: No JVD.  Lungs: Normal respiratory effort on 3L [place]. Clear bilaterally to auscultation. No wheezes, crackles, rhonchi.  Heart: HRRR. Normal S1 and S2 without gallops or murmurs.  Abdomen: Non-distended appearing.  Msk: Normal strength and tone for age. Extremities: Warm and well perfused. No clubbing, cyanosis.  3+ pitting edema.  Neuro: Alert and oriented X 3. Psych: Answers questions appropriately.   Labs: Basic Metabolic Panel: Recent Labs    08/10/24 0732 08/11/24 0521  NA 141 141  K 4.7 4.5  CL 102 102  CO2 28 31  GLUCOSE 168* 152*  BUN 37* 38*  CREATININE 1.67* 1.74*  CALCIUM  11.1* 10.2   Liver Function Tests: Recent Labs    08/10/24 0732  AST 45*  ALT 12  ALKPHOS 129*  BILITOT 1.9*  PROT 8.1  ALBUMIN 4.2   No results for input(s): LIPASE, AMYLASE in the last 72 hours. CBC: Recent Labs    08/10/24 0732  WBC 8.9  HGB 8.0*  HCT 26.1*  MCV 102.8*  PLT 159   Cardiac Enzymes: No results for input(s): CKTOTAL, CKMB, CKMBINDEX, TROPONINIHS in the last 72 hours. BNP: No results for input(s): BNP in the last 72 hours. D-Dimer: No results for input(s): DDIMER in the last 72 hours. Hemoglobin A1C: No results for input(s): HGBA1C in the last 72 hours. Fasting Lipid Panel: No results for input(s): CHOL, HDL, LDLCALC, TRIG, CHOLHDL, LDLDIRECT in the last 72 hours. Thyroid  Function Tests: No results  for input(s): TSH, T4TOTAL, T3FREE,  THYROIDAB in the last 72 hours.  Invalid input(s): FREET3 Anemia Panel: No results for input(s): VITAMINB12, FOLATE, FERRITIN, TIBC, IRON , RETICCTPCT in the last 72 hours.   Radiology: [facility] Chest Port 1 View Result Date: 08/10/2024 EXAM: 1 VIEW(S) XRAY OF THE CHEST 08/10/2024 04:14:18 PM COMPARISON: 08/10/2024 CLINICAL HISTORY: S/P thoracentesis FINDINGS: LINES, TUBES AND DEVICES: TAVR in place. CABG markers noted. LUNGS AND PLEURA: Mild pulmonary edema, unchanged. Stable small to moderate bilateral pleural effusions. Unchanged bibasilar opacities. No pneumothorax. HEART AND MEDIASTINUM: Stable cardiomegaly. Aortic atherosclerosis. BONES AND SOFT TISSUES: Median sternotomy wires noted. No acute osseous abnormality. IMPRESSION: 1. Mild pulmonary edema, unchanged. 2. Stable small to moderate bilateral pleural effusions. Electronically signed by: Greig Pique MD 08/10/2024 06:13 PM EST RP Workstation: [id number]   US  THORACENTESIS ASP PLEURAL SPACE W/IMG GUIDE Result Date: 08/10/2024 INDICATION: 78 year old male with an extensive cardiac history who presented to the ED today with worsening exertional chest pain and dyspnea on exertion. Workup consistent with heart failure exacerbation and imaging concerning for moderate bilateral pleural effusions with mild pulmonary edema and bibasilar atelectasis. Request for diagnostic and therapeutic thoracentesis. EXAM: ULTRASOUND GUIDED DIAGNOSTIC AND THERAPEUTIC, LEFT-SIDED THORACENTESIS MEDICATIONS: 1% lidocaine , 6 mL. COMPLICATIONS: None immediate. PROCEDURE: An ultrasound guided thoracentesis was thoroughly discussed with the patient and questions answered. The benefits, risks, alternatives and complications were also discussed. The patient understands and wishes to proceed with the procedure. Written consent was obtained. Ultrasound was performed to localize and mark an adequate pocket of fluid in the left chest. The area was then prepped and draped  in the normal sterile fashion. 1% Lidocaine  was used for local anesthesia. Under ultrasound guidance a 6 Fr Safe-T-Centesis catheter was introduced. Thoracentesis was performed. The catheter was removed and a dressing applied. FINDINGS: A total of approximately 700 mL of serous sanguinous fluid was removed. Samples were sent to the laboratory as requested by the clinical team. IMPRESSION: Successful ultrasound guided left-sided thoracentesis yielding 700 mL of pleural fluid. Procedure performed by: Sherrilee Bal, PA-C under the supervision of Dr. VEAR Lent Electronically Signed   By: Wilkie Lent M.D.   On: 08/10/2024 16:38   DG Chest 2 View Result Date: 08/10/2024 EXAM: 2 VIEW(S) XRAY OF THE CHEST 08/10/2024 08:36:00 AM COMPARISON: 08/01/2024 CLINICAL HISTORY: dyspnea, cp FINDINGS: LUNGS AND PLEURA: Moderate bilateral pleural effusions. Mild pulmonary edema. Bibasilar atelectasis/airspace disease. No pneumothorax. HEART AND MEDIASTINUM: Stable CABG markers. TAVR noted. Aortic atherosclerosis. BONES AND SOFT TISSUES: Sternotomy wires noted. No acute osseous abnormality. IMPRESSION: 1. Moderate bilateral pleural effusions with mild pulmonary edema and bibasilar atelectasis/airspace disease. Electronically signed by: Waddell Calk MD 08/10/2024 08:41 AM EST RP Workstation: [id number]   DG Chest 1 View Result Date: 08/01/2024 EXAM: 1 VIEW(S) XRAY OF THE CHEST 08/01/2024 09:32:09 AM COMPARISON: 07/20/2024 CLINICAL HISTORY: SOB (shortness of breath) FINDINGS: LUNGS AND PLEURA: Bibasilar airspace opacities are stable. Mild pulmonary edema is present. Moderate right pleural effusion, slightly decreased. Similar small left pleural effusion. No pneumothorax. HEART AND MEDIASTINUM: Stable cardiomegaly. Aortic atherosclerotic calcification. Aortic valve replacement is noted. BONES AND SOFT TISSUES: Median sternotomy is noted. No acute osseous abnormality. IMPRESSION: 1. Moderate right pleural effusion,  slightly decreased, and similar small left pleural effusion. 2. Stable bibasilar airspace opacities and mild pulmonary edema. 3. Stable cardiomegaly and aortic atherosclerotic calcification. 4. Postsurgical changes of median sternotomy and aortic valve replacement. Electronically signed by: Evalene Coho MD 08/01/2024 09:37 AM EST RP Workstation: [id number]   CT CHEST ABDOMEN PELVIS  WO CONTRAST Result Date: 07/20/2024 EXAM: CT CHEST, ABDOMEN AND PELVIS WITHOUT CONTRAST 07/20/2024 06:52:00 PM TECHNIQUE: CT of the chest, abdomen and pelvis was performed without the administration of intravenous contrast. Multiplanar reformatted images are provided for review. Automated exposure control, iterative reconstruction, and/or weight based adjustment of the mA/kV was utilized to reduce the radiation dose to as low as reasonably achievable. COMPARISON: 08/20/2023 CLINICAL HISTORY: LDH elevation. Oncology wanted to see if cancerous process. Okay for oral contrast but not intravenous. FINDINGS: CHEST: MEDIASTINUM AND LYMPH NODES: Status post transcatheter aortic valve replacement and coronary artery bypass grafting. Mild cardiomegaly. No pericardial effusion. The central pulmonary arteries are enlarged in keeping with changes of pulmonary arterial hypertension. Mild atherosclerotic calcification within the thoracic aorta. No aortic aneurysm. There is progressive pathologic mediastinal adenopathy. By example, previously measured left prevascular and precarinal lymph nodes now measure 12 mm (previously 10 mm) and 20 mm (previously 10 mm) in short axis diameter. The findings suggest either a chronic inflammatory or low-grade lymphoproliferative process. Stable 2.3 cm right thyroid  nodule, better assessed on thyroid  sonogram of 10/12/2020. No follow up imaging is recommended. LUNGS AND PLEURA: Small to moderate bilateral pleural effusions, right greater than left, appears similar to prior examination though there is new  pleural thickening bilaterally as well as probable septation noted at the right basilar pleural space which is nonspecific in the setting of repeated thoracentesis. Small focus of pleural gas on the right may relate to recent thoracentesis. No focal consolidation or pulmonary edema. No pneumothorax. ABDOMEN AND PELVIS: LIVER: The liver is unremarkable. GALLBLADDER AND BILE DUCTS: Cholelithiasis without superimposed pericholecystic inflammatory change. No intra or extrahepatic biliary ductal dilation. SPLEEN: No acute abnormality. PANCREAS: No acute abnormality. ADRENAL GLANDS: No acute abnormality. KIDNEYS, URETERS AND BLADDER: Simple exophytic cortical cysts are seen within the kidneys bilaterally for which no follow up imaging is recommended. Of a hyperdense exophytic cortical cyst arises anteriorly but little lower pole of the right kidney but is unchanged from prior examination and was better characterized as a simple cyst on that exam. Per consensus, no follow-up is needed for simple Bosniak type 1 and 2 renal cysts, unless the patient has a malignancy history or risk factors. 7 mm nonobstructing calculus within the lower pole of the left kidney. The kidneys are otherwise unremarkable. No stones in the ureters. No hydronephrosis. No perinephric or periureteral stranding. Urinary bladder is unremarkable. GI AND BOWEL: Moderate stool burden within the distal colon and rectum without evidence of obstruction. The stomach, small bowel, and large bowel are otherwise unremarkable. Appendix absent. REPRODUCTIVE ORGANS: No acute abnormality. PERITONEUM AND RETROPERITONEUM: No ascites. No free air. VASCULATURE: Aorta is normal in caliber. Extensive aortic iliac atherosclerotic calcification. ABDOMINAL AND PELVIS LYMPH NODES: Pathologic aortic cable adenopathy with a single lymph node measuring 15 mm in short axis diameter is stable since prior examination, nonspecific. No additional pathologic adenopathy within the  abdomen and pelvis. REPRODUCTIVE ORGANS: No acute abnormality. BONES AND SOFT TISSUES: Osseous structures are age appropriate. No acute bone abnormality. No lytic or blastic bone lesion. No focal soft tissue abnormality. IMPRESSION: 1. Progressive pathologic mediastinal adenopathy, suggestive of a chronic inflammatory or low-grade lymphoproliferative process. 2. Small to moderate bilateral pleural effusions, right greater than left, with new bilateral pleural thickening and probable right basilar pleural space septation; small right pleural gas likely post-thoracentesis. 3. Stable pathologic aortocaval adenopathy measuring 15 mm in short axis, nonspecific. 4. Stable 2.3 cm right thyroid  nodule; no follow-up imaging is recommended. 5. Cholelithiasis  without acute inflammatory change and without biliary ductal dilation. 6. Moderate stool burden in the distal colon and rectum without obstruction. 7. RAF score: Aortic Atherosclerosis (ICD10-I70.0). Electronically signed by: Dorethia Molt MD 07/20/2024 08:42 PM EST RP Workstation: [id number]   DG Chest Port 1 View Result Date: 07/20/2024 CLINICAL DATA:  Status post right thoracentesis. EXAM: PORTABLE CHEST 1 VIEW COMPARISON:  07/18/2024 FINDINGS: Small to moderate-sized right pleural effusion, decreased in size. No pneumothorax. No significant change in right basilar atelectasis and possible pneumonia. Stable small left pleural effusion. Increased linear and patchy density at the left lung base. Atheromatous aortic arch calcifications. Diffuse osteopenia. IMPRESSION: 1. Small to moderate-sized right pleural effusion, decreased in size following thoracentesis. 2. No pneumothorax. 3. No significant change in right basilar atelectasis and possible pneumonia. 4. Increased left basilar atelectasis and possible pneumonia. 5. Stable small left pleural effusion. Electronically Signed   By: Elspeth Bathe M.D.   On: 07/20/2024 14:34   US  THORACENTESIS ASP PLEURAL SPACE W/IMG  GUIDE Result Date: 07/20/2024 INDICATION: 78 year old male with an extensive cardiac history, including heart failure and aortic valve replacement in 01/2024, and severe pulmonary hypertension with recurrent right-sided pleural effusions. Request for diagnostic and therapeutic thoracentesis. EXAM: ULTRASOUND GUIDED DIAGNOSTIC AND THERAPEUTIC, RIGHT-SIDED THORACENTESIS MEDICATIONS: 1% lidocaine , 8 mL. COMPLICATIONS: None immediate. PROCEDURE: An ultrasound guided thoracentesis was thoroughly discussed with the patient and questions answered. The benefits, risks, alternatives and complications were also discussed. The patient understands and wishes to proceed with the procedure. Written consent was obtained. Ultrasound was performed and was notable for several loculations throughout the right chest. An adequate pocket of fluid was identified in the right chest. The area was then prepped and draped in the normal sterile fashion. 1% Lidocaine  was used for local anesthesia. Under ultrasound guidance a 6 Fr Safe-T-Centesis catheter was introduced. Thoracentesis was performed. Unfortunately, due to the septations, only a small amount of fluid was able to be drained. Residual fluid noted to the right lateral chest on post procedure ultrasound. The catheter was removed and a dressing applied. FINDINGS: A total of approximately 450 mL of amber fluid was removed. Samples were sent to the laboratory as requested by the clinical team. IMPRESSION: Successful ultrasound guided right-sided thoracentesis yielding 450 mL of pleural fluid. Procedure performed by: Sherrilee Bal, PA-C under the supervision of Dr. ONEIDA Specking. Electronically Signed   By: CHRISTELLA.  Shick M.D.   On: 07/20/2024 11:14   DG Chest 2 View Result Date: 07/18/2024 CLINICAL DATA:  Chest pain and shortness of breath EXAM: CHEST - 2 VIEW COMPARISON:  07/02/2024 FINDINGS: Frontal and lateral views of the chest demonstrate stable postsurgical changes from median  sternotomy and aortic valve replacement. Cardiac silhouette is enlarged but stable. Persistent bibasilar veiling opacities consistent with consolidation and effusions, right greater than left. No pneumothorax. No acute bony abnormalities. IMPRESSION: 1. Stable bibasilar consolidation and effusions, right greater than left. 2. Stable enlarged cardiac silhouette. Electronically Signed   By: Ozell Daring M.D.   On: 07/18/2024 14:53    ECHO 03/2024: NORMAL LEFT VENTRICULAR SYSTOLIC FUNCTION WITH SEVERE LVH  ESTIMATED EF: >55%, CALC EF(3D): 55%  ELEVATED LA PRESSURES W/ DIASTOLIC DYSFUNCTION (GRADE 3)  NORMAL RIGHT VENTRICULAR SYSTOLIC FUNCTION  VALVULAR REGURGITATION: MILD AR, MILD MR, TRIVIAL PR, MILD TR  ESTIMATED RVSP: 85 mmHg (ABNORMAL)  VALVULAR STENOSIS: TRANSCATHETER BIOPROSTHETIC, No MS, No PS, No TS   TELEMETRY (personally reviewed): Not on telemetry  EKG (personally reviewed): atrial fibrillation rate 75 bpm  Data reviewed  by me 08/11/2024: last 24h vitals tele labs imaging I/O ED provider note, admission H&P  Principal Problem:   Acute heart failure with preserved ejection fraction (HFpEF) (HCC)    ASSESSMENT AND PLAN:  [name] is a 78 y.o. 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 Cardiology was consulted for further evaluation.   # Acute hypoxic respiratory failure # Acute on chronic HFpEF # Bilateral pleural effusions # Coronary artery disease s/p CABG # Demand ischemia # Aortic stenosis s/p TAVR 01/2024 # Chronic anemia Patient presented to ED for evaluation of SOB, CP. BNP elevated at 15,340. Trops mild and flat 67 > 73. Chest x-ray with bilateral pleural effusions.  S/p thoracentesis 08/10/2024 with 700 cc removed.  -Continue IV lasix  60 mg twice daily.  -Continue home jardiance  10 mg daily, carvedilol  3.125 mg twice daily. Consider addition of MRA pending renal function.  -Continue aspirin  81 mg daily and atorvastatin  80 mg daily.  -Continue amlodipine  10 mg daily and imdur  120 mg daily.    This patient's plan of care was discussed and created with Dr. Wilburn and he is in agreement.  Signed: Danita Bloch, PA-C  08/11/2024, 10:31 AM [facility]      "

## 2024-08-11 NOTE — Telephone Encounter (Signed)
 Below concerns previously addressed on 08/04/24; patient did not need a blood transfusion at that time.

## 2024-08-11 NOTE — Progress Notes (Signed)
 " PROGRESS NOTE    HPI was taken from NP [name] is a 78 y.o. male with medical history significant of 78 y.o. 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  Discharged on 07/23/24 after presenting with respiratory symptoms secondary to recurrent right pleural effusion as well as symptomatic anemia.  He underwent a thoracentesis at that time and 450 cc of fluid was obtained.  CT chest, abdomen and pelvis demonstrated progressive mediastinal adenopathy suggestive of either chronic inflammatory process or low-grade lymphoproliferative process.  Subsequent cytology was negative for any malignancy.  He was discharged home on base level 4 L O2.  Because of the severity of pulmonary hypertension Watchman procedure has been delayed according to the patient.  He is currently not on anticoagulation due to recurrent GI bleeding and symptomatic anemia requiring transfusions.  His anemia is multifactorial secondary to iron  deficiency with chronic blood loss as well as RBC destruction in setting of TAVR.   He returns to the ED today complaints of significant exertional chest pain and dyspnea ongoing for greater than 1 week.  Also reports of increased lower extremity edema worse than baseline.  Of note patient reports he had not taken his diuretic or his normal medications prior to presenting to the ED today.  Workup consistent with heart failure exacerbation noting elevated proBNP greater than 15,000, elevated troponin x 2 less than 100, and chest x-ray revealed moderate bilateral pleural effusions with mild  pulmonary edema and bibasilar atelectasis or airspace disease.  Patient was concerned that his chronic anemia may be contributing to his respiratory symptoms and chest pain.  EDP did speak with patient's oncologist [name] who also felt that symptoms are being precipitated by heart failure not by anemia.  Hospitalist service was asked to evaluate the patient for admission.   Cardiac History:              1.  CABG x 3             2.  2.5 x 18 mm Onyx frontier DES ostium SVG to PDA 2021 at [facility]             3.  Status post TAVR 01/23/2024 at [facility]             4.  3.5 x 23 mm Synergy in-stent restenosis ostium SVG to PDA at [facility] 03/20/2024             5.  Essential hypertension             6.  Hyperlipidemia             7.  Type 2 diabetes             8.  Chronic kidney disease stage IV followed by [name]             8.  Chronic anemia followed by [name] at [facility]             9.  Pulmonary hypertension (PA mean 51 mmHg by RHC 07/01/2024) (AP)   [name]  [name] DOB: 04-21-1946 DOA: 08/10/2024 PCP:  Jerry Manna, MD    Assessment & Plan:   Principal Problem:   Acute heart failure with preserved ejection fraction (HFpEF) (HCC)  Assessment and Plan: Acute exacerbation HFpEF: continue on IV lasix . Monitor I/Os. Continue on coreg , imdur . Cardio following and recs apprec.   Severe pulmonary hypertension: management as per pulmon  Severe MR: management as per cardio.   Recurrent right pleural effusion: s/p right thoracentesis in which of serosanguinous fluid removed on 08/10/24. Has had multiple thoracentesis and previous cytology neg for malignancy   Mediastinal lymphadenopathy: etiology unclear. Will need to be f/u outpatient    Chronic a.fib: not a candidate for anticoagulation secondary to hx of GI bleed. Continue on coreg . Awaiting Watchman procedure but unable to undergo due to high right heart pressures secondary to severe pulmonary  hypertension   Chronic anemia: macrocytic, hx of ACD & hx of RBC destruction in setting of TAVR. No need for a transfusion currently. Will transfuse if Hb < 7.0.    CKDIIIb: Cr is trending up from day prior. Will continue to monitor    CAD: w/ hx of CABG x 3, history of prior cardiac stent x 2. W/ elevated troponins likely secondary to demand ischemia. Continue on coreg , imdur .   HTN: continue on coreg , imdur , lasix , amlodipine    HLD: continue on statin    OSA: unable to tolerate CPAP    Physical deconditioning: PT/OT consulted. Consideration is given to severe knee pain limiting patient's ability to participate.   Failure to thrive: secondary to all above. Discussed pt's many chronic conditions that will likely get worse with time and that the rest of his life will be spent between home and the hospital and pt wants to continue to do so currently.       DVT prophylaxis: heparin  sq  Code Status: full  Family Communication: discussed pt's care w/ pt's son at bedside  Disposition Plan: depends on PT/OT recs   Level of care: Progressive  Status is: Inpatient Remains inpatient appropriate because: severity of illness     Consultants:  Cardio Pulmon   Procedures:   Antimicrobials:   Subjective: Pt c/o malaise and shortness of breath   Objective: Vitals:   08/11/24 0730 08/11/24 0750 08/11/24 0923 08/11/24 0954  BP: (!) 157/68 (!) 170/74 (!) 153/65   Pulse: 66 68    Resp: 16     Temp:    97.7 F (36.5 C)  TempSrc:    Oral  SpO2: 100%     Weight:      Height:        Intake/Output Summary (Last 24 hours) at 08/11/2024 0954 Last data filed at 08/11/2024 0537 Gross per 24 hour  Intake --  Output 175 ml  Net -175 ml   Filed Weights   08/10/24 0710  Weight: 86.2 kg    Examination:  General exam: Appears uncomfortable  Respiratory system: diminished breath sounds b/l  Cardiovascular system: S1 & S2+. No rubs, gallops or clicks.  Gastrointestinal system:  Abdomen is nondistended, soft and nontender. Hypoactive bowel sounds heard. Central nervous system: Alert and oriented. Moves all extremities Psychiatry: Judgement and insight appears at baseline. Flat mood and affect     Data Reviewed: I have personally reviewed following labs and imaging studies  CBC: Recent Labs  Lab 08/04/24 1436 08/10/24 0732  WBC 7.1 8.9  NEUTROABS 5.9  --   HGB 8.0* 8.0*  HCT 25.8* 26.1*  MCV 100.4* 102.8*  PLT 126* 159   Basic  Metabolic Panel: Recent Labs  Lab 08/10/24 0732 08/11/24 0521  NA 141 141  K 4.7 4.5  CL 102 102  CO2 28 31  GLUCOSE 168* 152*  BUN 37* 38*  CREATININE 1.67* 1.74*  CALCIUM  11.1* 10.2   GFR: Estimated Creatinine Clearance: 40.3 mL/min (A) (by C-G formula based on SCr of 1.74 mg/dL (H)). Liver Function Tests: Recent Labs  Lab 08/10/24 0732  AST 45*  ALT 12  ALKPHOS 129*  BILITOT 1.9*  PROT 8.1  ALBUMIN 4.2   No results for input(s): LIPASE, AMYLASE in the last 168 hours. No results for input(s): AMMONIA in the last 168 hours. Coagulation Profile: No results for input(s): INR, PROTIME in the last 168 hours. Cardiac Enzymes: No results for input(s): CKTOTAL, CKMB, CKMBINDEX, TROPONINI in the last 168 hours. BNP (last 3 results) Recent Labs    07/01/24 1220 07/18/24 1707 08/10/24 0732  PROBNP 12,167.0* 13,853.0* 15,340.0*   HbA1C: No results for input(s): HGBA1C in the last 72 hours. CBG: No results for input(s): GLUCAP in the last 168 hours. Lipid Profile: No results for input(s): CHOL, HDL, LDLCALC, TRIG, CHOLHDL, LDLDIRECT in the last 72 hours. Thyroid  Function Tests: No results for input(s): TSH, T4TOTAL, FREET4, T3FREE, THYROIDAB in the last 72 hours. Anemia Panel: No results for input(s): VITAMINB12, FOLATE, FERRITIN, TIBC, IRON , RETICCTPCT in the last 72 hours. Sepsis Labs: No results for input(s): PROCALCITON, LATICACIDVEN in the last  168 hours.  No results found for this or any previous visit (from the past 240 hours).       Radiology Studies: DG Chest Port 1 View Result Date: 08/10/2024 EXAM: 1 VIEW(S) XRAY OF THE CHEST 08/10/2024 04:14:18 PM COMPARISON: 08/10/2024 CLINICAL HISTORY: S/P thoracentesis FINDINGS: LINES, TUBES AND DEVICES: TAVR in place. CABG markers noted. LUNGS AND PLEURA: Mild pulmonary edema, unchanged. Stable small to moderate bilateral pleural effusions. Unchanged bibasilar opacities. No pneumothorax. HEART AND MEDIASTINUM: Stable cardiomegaly. Aortic atherosclerosis. BONES AND SOFT TISSUES: Median sternotomy wires noted. No acute osseous abnormality. IMPRESSION: 1. Mild pulmonary edema, unchanged. 2. Stable small to moderate bilateral pleural effusions. Electronically signed by: Greig Pique MD 08/10/2024 06:13 PM EST RP Workstation: [id number]   US  THORACENTESIS ASP PLEURAL SPACE W/IMG GUIDE Result Date: 08/10/2024 INDICATION: 78 year old male with an extensive cardiac history who presented to the ED today with worsening exertional chest pain and dyspnea on exertion. Workup consistent with heart failure exacerbation and imaging concerning for moderate bilateral pleural effusions with mild pulmonary edema and bibasilar atelectasis. Request for diagnostic and therapeutic thoracentesis. EXAM: ULTRASOUND GUIDED DIAGNOSTIC AND THERAPEUTIC, LEFT-SIDED THORACENTESIS MEDICATIONS: 1% lidocaine , 6 mL. COMPLICATIONS: None immediate. PROCEDURE: An ultrasound guided thoracentesis was thoroughly discussed with the patient and questions answered. The benefits, risks, alternatives and complications were also discussed. The patient understands and wishes to proceed with the procedure. Written consent was obtained. Ultrasound was performed to localize and mark an adequate pocket of fluid in the left chest. The area was then prepped and draped in the normal sterile fashion. 1% Lidocaine  was used for local anesthesia. Under  ultrasound guidance a 6 Fr Safe-T-Centesis catheter was introduced. Thoracentesis was performed. The catheter was removed and a dressing applied. FINDINGS: A total of approximately 700 mL of serous sanguinous fluid was removed. Samples were sent to the laboratory as requested by the clinical team. IMPRESSION: Successful ultrasound guided left-sided thoracentesis yielding 700 mL of pleural fluid. Procedure performed by: Sherrilee Bal, PA-C under the supervision of Dr. VEAR Lent Electronically Signed   By: Wilkie  Karalee M.D.   On: 08/10/2024 16:38   DG Chest 2 View Result Date: 08/10/2024 EXAM: 2 VIEW(S) XRAY OF THE CHEST 08/10/2024 08:36:00 AM COMPARISON: 08/01/2024 CLINICAL HISTORY: dyspnea, cp FINDINGS: LUNGS AND PLEURA: Moderate bilateral pleural effusions. Mild pulmonary edema. Bibasilar atelectasis/airspace disease. No pneumothorax. HEART AND MEDIASTINUM: Stable CABG markers. TAVR noted. Aortic atherosclerosis. BONES AND SOFT TISSUES: Sternotomy wires noted. No acute osseous abnormality. IMPRESSION: 1. Moderate bilateral pleural effusions with mild pulmonary edema and bibasilar atelectasis/airspace disease. Electronically signed by: Waddell Calk MD 08/10/2024 08:41 AM EST RP Workstation: [id number]        Scheduled Meds:  amLODipine   10 mg Oral Daily   aspirin  EC  81 mg Oral Daily   atorvastatin   80 mg Oral Daily   carvedilol   3.125 mg Oral BID WC   cyanocobalamin   1,000 mcg Oral Daily   empagliflozin   10 mg Oral QAC breakfast   ferrous sulfate   325 mg Oral Daily   furosemide   60 mg Intravenous Q12H   heparin   5,000 Units Subcutaneous Q8H   isosorbide  mononitrate  120 mg Oral Daily   pantoprazole   40 mg Oral Daily   sodium chloride  flush  3 mL Intravenous Q12H   Continuous Infusions:   LOS: 0 days       Anthony CHRISTELLA Pouch, MD [facility] Pager [phone]  If 7PM-7AM, please contact night-coverage [email or web address]  08/11/2024, 9:54 AM   "

## 2024-08-11 NOTE — Progress Notes (Signed)
 Heart Failure Navigator Progress Note  Assessed for Heart & Vascular TOC clinic readiness.  Patient does not meet criteria due to current [facility] Cardiology patient.   Navigator will sign off at this time.  Roxy Horseman, RN, BSN [facility] Heart Failure Navigator Secure Chat Only

## 2024-08-12 DIAGNOSIS — I5031 Acute diastolic (congestive) heart failure: Secondary | ICD-10-CM | POA: Diagnosis not present

## 2024-08-12 LAB — CBC
HCT: 22.3 % — ABNORMAL LOW (ref 39.0–52.0)
Hemoglobin: 7.1 g/dL — ABNORMAL LOW (ref 13.0–17.0)
MCH: 32.4 pg (ref 26.0–34.0)
MCHC: 31.8 g/dL (ref 30.0–36.0)
MCV: 101.8 fL — ABNORMAL HIGH (ref 80.0–100.0)
Platelets: 111 K/uL — ABNORMAL LOW (ref 150–400)
RBC: 2.19 MIL/uL — ABNORMAL LOW (ref 4.22–5.81)
RDW: 18.6 % — ABNORMAL HIGH (ref 11.5–15.5)
WBC: 5.7 K/uL (ref 4.0–10.5)
nRBC: 0 % (ref 0.0–0.2)

## 2024-08-12 LAB — BASIC METABOLIC PANEL WITH GFR
Anion gap: 9 (ref 5–15)
BUN: 42 mg/dL — ABNORMAL HIGH (ref 8–23)
CO2: 30 mmol/L (ref 22–32)
Calcium: 10 mg/dL (ref 8.9–10.3)
Chloride: 100 mmol/L (ref 98–111)
Creatinine, Ser: 1.78 mg/dL — ABNORMAL HIGH (ref 0.61–1.24)
GFR, Estimated: 39 mL/min — ABNORMAL LOW
Glucose, Bld: 153 mg/dL — ABNORMAL HIGH (ref 70–99)
Potassium: 4.3 mmol/L (ref 3.5–5.1)
Sodium: 139 mmol/L (ref 135–145)

## 2024-08-12 LAB — PREPARE RBC (CROSSMATCH)

## 2024-08-12 MED ORDER — SODIUM CHLORIDE 0.9% IV SOLUTION: Freq: Once | INTRAVENOUS | Status: DC

## 2024-08-12 NOTE — Progress Notes (Addendum)
 "    Progress Note    [name]  [name] DOB: 01-22-1946  DOA: 08/10/2024 PCP: [name], MD      Brief Narrative:    Medical records reviewed and are as summarized below:  LORIK Fitzgerald is a 78 y.o. 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 Discharged on 07/23/24 after presenting with respiratory symptoms secondary to recurrent right pleural effusion as well as symptomatic anemia. He underwent a thoracentesis at that time and 450 cc of fluid was obtained. CT chest, abdomen and pelvis demonstrated progressive mediastinal adenopathy suggestive of either chronic inflammatory process or low-grade lymphoproliferative process. Subsequent cytology was negative for any malignancy. He was discharged home on base level 4 L O2. Because of the severity of pulmonary hypertension Watchman procedure has been delayed according to the patient. He is currently not on anticoagulation due to recurrent GI bleeding and symptomatic anemia requiring transfusions. His anemia is multifactorial secondary to iron  deficiency with chronic blood loss as well as RBC destruction in setting of TAVR.   He presented to the ED again  on 08/10/2024 with complaints of exertional shortness of breath and chest pain that had been going on for over a week.  Initially complained of increasing lower extremity edema worse than baseline.   proBNP 15,340  Chest x-ray IMPRESSION: 1. Moderate bilateral pleural effusions with mild pulmonary edema and bibasilar atelectasis/airspace disease.   Assessment/Plan:    Principal Problem:   Acute heart failure with preserved ejection fraction (HFpEF) (HCC) Active Problems:   Acute heart failure with preserved ejection fraction (HCC)    Body mass index is 25.39 kg/m.   Acute on chronic HFpEF cardiology, moderate to severe mitral regurgitation: Continue IV Lasix .  Monitor BMP, daily weight and urine output. Follow-up with cardiologist. 2D echo in July 2025 showed EF estimated at 66 5%, moderate to severe MR, mild to moderate TR   Moderate bilateral pleural effusion: S/p left thoracentesis with removal of 700 mL of pleural fluid on 08/10/2024   Severe pulmonary hypertension: Follow-up with pulmonologist.     Chronic hypoxic respiratory failure: Uses 2 L/min oxygen as needed at home.  However, he said he has been using it regularly of late.   Elevated troponins: Is likely from demand ischemia.   Permanent atrial fibrillation: Not on anticoagulation because of chronic anemia and history of GI bleed    Acute on chronic anemia: Hemoglobin down to 7.1.  Transfuse 1 unit of PRBCs because of underlying CAD and CHF.  Discussed risks and benefits and alternatives to blood transfusion.  Patient said he has had multiple transfusion in the past and he is okay with blood transfusion.   CKD stage IIIb: Creatinine is stable    Comorbidities include CAD s/p CABG x 3, s/p coronary stent x 2, hypertension, hyperlipidemia, OSA, CPAP intolerant)   Diet Order             Diet Heart Room service appropriate? Yes; Fluid consistency: Thin  Diet effective now  Consultants: Cardiologist Pulmonologist Interventional radiologist  Procedures: Left thoracentesis on 08/10/2024    Medications:    amLODipine   10 mg Oral Daily   aspirin  EC  81 mg Oral Daily   atorvastatin   80 mg Oral Daily   carvedilol   3.125 mg Oral BID WC   cyanocobalamin   1,000 mcg Oral Daily   empagliflozin   10 mg Oral QAC  breakfast   ferrous sulfate   325 mg Oral Daily   furosemide   60 mg Intravenous Q12H   heparin   5,000 Units Subcutaneous Q8H   isosorbide  mononitrate  120 mg Oral Daily   pantoprazole   40 mg Oral Daily   sodium chloride  flush  3 mL Intravenous Q12H   Continuous Infusions:   Anti-infectives (From admission, onward)    None              Family Communication/Anticipated D/C date and plan/Code Status   DVT prophylaxis: heparin  injection 5,000 Units Start: 08/10/24 2200     Code Status: Full Code  Family Communication: None Disposition Plan: Plan to discharge home   Status is: Inpatient Remains inpatient appropriate because: CHF exacerbation       Subjective:   Interval events noted.  He complains of exertional shortness of breath and chest pain.  He also feels weak and tired.  No other complaints.   Objective:    Vitals:   08/12/24 0357 08/12/24 0500 08/12/24 0726 08/12/24 0916  BP: 136/72  (!) 143/67 (!) 160/79  Pulse: 68  (!) 55 65  Resp: 20  20   Temp: 97.9 F (36.6 C)  98.3 F (36.8 C)   TempSrc:      SpO2: 100%  98%   Weight:  86.1 kg    Height:       No data found.   Intake/Output Summary (Last 24 hours) at 08/12/2024 1024 Last data filed at 08/12/2024 0900 Gross per 24 hour  Intake --  Output 2525 ml  Net -2525 ml   Filed Weights   08/10/24 0710 08/12/24 0500  Weight: 86.2 kg 86.1 kg    Exam:   GEN: NAD SKIN: Warm and dry EYES: Pale, anicteric ENT: MMM CV: RRR PULM: CTA B ABD: soft, ND, NT, +BS CNS: AAO x 3, non focal EXT: B/l leg and pedal edema, no tenderness       Data Reviewed:   I have personally reviewed following labs and imaging studies:  Labs: Labs show the following:   Basic Metabolic Panel: Recent Labs  Lab 08/10/24 0732 08/11/24 0521 08/12/24 0408  NA 141 141 139  K 4.7 4.5 4.3  CL 102 102 100  CO2 28 31 30   GLUCOSE 168* 152* 153*  BUN 37* 38* 42*  CREATININE 1.67* 1.74* 1.78*  CALCIUM   11.1* 10.2 10.0   GFR Estimated Creatinine Clearance: 39.4 mL/min (A) (by C-G formula based on SCr of 1.78 mg/dL (H)). Liver Function Tests: Recent Labs  Lab 08/10/24 0732  AST 45*  ALT 12  ALKPHOS 129*  BILITOT 1.9*  PROT 8.1  ALBUMIN 4.2   No results for input(s): LIPASE, AMYLASE in the last 168 hours. No results for input(s): AMMONIA in the last 168 hours. Coagulation profile No results for input(s): INR, PROTIME in the last 168 hours.  CBC: Recent Labs  Lab 08/10/24 0732 08/12/24 0408  WBC 8.9 5.7  HGB 8.0* 7.1*  HCT 26.1* 22.3*  MCV 102.8* 101.8*  PLT 159 111*   Cardiac Enzymes: No results for input(s): CKTOTAL, CKMB, CKMBINDEX,  TROPONINI in the last 168 hours. BNP (last 3 results) Recent Labs    07/01/24 1220 07/18/24 1707 08/10/24 0732  PROBNP 12,167.0* 13,853.0* 15,340.0*   CBG: No results for input(s): GLUCAP in the last 168 hours. D-Dimer: No results for input(s): DDIMER in the last 72 hours. Hgb A1c: No results for input(s): HGBA1C in the last 72 hours. Lipid Profile: No results for input(s): CHOL, HDL, LDLCALC, TRIG, CHOLHDL, LDLDIRECT in the last 72 hours. Thyroid  function studies: No results for input(s): TSH, T4TOTAL, T3FREE, THYROIDAB in the last 72 hours.  Invalid input(s): FREET3 Anemia work up: No results for input(s): VITAMINB12, FOLATE, FERRITIN, TIBC, IRON , RETICCTPCT in the last 72 hours. Sepsis Labs: Recent Labs  Lab 08/10/24 0732 08/12/24 0408  WBC 8.9 5.7    Microbiology No results found for this or any previous visit (from the past 240 hours).  Procedures and diagnostic studies:  DG Chest Port 1 View Result Date: 08/10/2024 EXAM: 1 VIEW(S) XRAY OF THE CHEST 08/10/2024 04:14:18 PM COMPARISON: 08/10/2024 CLINICAL HISTORY: S/P thoracentesis FINDINGS: LINES, TUBES AND DEVICES: TAVR in place. CABG markers noted. LUNGS AND PLEURA: Mild pulmonary edema, unchanged. Stable  small to moderate bilateral pleural effusions. Unchanged bibasilar opacities. No pneumothorax. HEART AND MEDIASTINUM: Stable cardiomegaly. Aortic atherosclerosis. BONES AND SOFT TISSUES: Median sternotomy wires noted. No acute osseous abnormality. IMPRESSION: 1. Mild pulmonary edema, unchanged. 2. Stable small to moderate bilateral pleural effusions. Electronically signed by: Greig Pique MD 08/10/2024 06:13 PM EST RP Workstation: [id number]   US  THORACENTESIS ASP PLEURAL SPACE W/IMG GUIDE Result Date: 08/10/2024 INDICATION: 78 year old male with an extensive cardiac history who presented to the ED today with worsening exertional chest pain and dyspnea on exertion. Workup consistent with heart failure exacerbation and imaging concerning for moderate bilateral pleural effusions with mild pulmonary edema and bibasilar atelectasis. Request for diagnostic and therapeutic thoracentesis. EXAM: ULTRASOUND GUIDED DIAGNOSTIC AND THERAPEUTIC, LEFT-SIDED THORACENTESIS MEDICATIONS: 1% lidocaine , 6 mL. COMPLICATIONS: None immediate. PROCEDURE: An ultrasound guided thoracentesis was thoroughly discussed with the patient and questions answered. The benefits, risks, alternatives and complications were also discussed. The patient understands and wishes to proceed with the procedure. Written consent was obtained. Ultrasound was performed to localize and mark an adequate pocket of fluid in the left chest. The area was then prepped and draped in the normal sterile fashion. 1% Lidocaine  was used for local anesthesia. Under ultrasound guidance a 6 Fr Safe-T-Centesis catheter was introduced. Thoracentesis was performed. The catheter was removed and a dressing applied. FINDINGS: A total of approximately 700 mL of serous sanguinous fluid was removed. Samples were sent to the laboratory as requested by the clinical team. IMPRESSION: Successful ultrasound guided left-sided thoracentesis yielding 700 mL of pleural fluid. Procedure  performed by: Sherrilee Bal, PA-C under the supervision of Dr. VEAR Lent Electronically Signed   By: Wilkie Lent M.D.   On: 08/10/2024 16:38               LOS: 1 day   Suriya Kovarik  [facility]   Pager on [email or web address]. If 7PM-7AM, please contact night-coverage at [email or web address]     08/12/2024, 10:24 AM           "

## 2024-08-12 NOTE — Plan of Care (Signed)
" °  Problem: Education: Goal: Knowledge of General Education information will improve Description: Including pain rating scale, medication(s)/side effects and non-pharmacologic comfort measures 08/12/2024 0536 by Alphonzo Sari SAILOR, RN Outcome: Progressing 08/12/2024 0535 by Alphonzo Sari SAILOR, RN Outcome: Progressing   Problem: Health Behavior/Discharge Planning: Goal: Ability to manage health-related needs will improve 08/12/2024 0536 by Alphonzo Sari SAILOR, RN Outcome: Progressing 08/12/2024 0535 by Alphonzo Sari SAILOR, RN Outcome: Progressing   Problem: Clinical Measurements: Goal: Ability to maintain clinical measurements within normal limits will improve 08/12/2024 0536 by Alphonzo Sari SAILOR, RN Outcome: Progressing 08/12/2024 0535 by Alphonzo Sari SAILOR, RN Outcome: Progressing Goal: Will remain free from infection 08/12/2024 0536 by Alphonzo Sari SAILOR, RN Outcome: Progressing 08/12/2024 0535 by Alphonzo Sari SAILOR, RN Outcome: Progressing Goal: Diagnostic test results will improve 08/12/2024 0536 by Alphonzo Sari SAILOR, RN Outcome: Progressing 08/12/2024 0535 by Alphonzo Sari SAILOR, RN Outcome: Progressing Goal: Respiratory complications will improve 08/12/2024 0536 by Alphonzo Sari SAILOR, RN Outcome: Progressing 08/12/2024 0535 by Alphonzo Sari SAILOR, RN Outcome: Progressing Goal: Cardiovascular complication will be avoided 08/12/2024 0536 by Alphonzo Sari SAILOR, RN Outcome: Progressing 08/12/2024 0535 by Alphonzo Sari SAILOR, RN Outcome: Progressing   Problem: Activity: Goal: Risk for activity intolerance will decrease 08/12/2024 0536 by Alphonzo Sari SAILOR, RN Outcome: Progressing 08/12/2024 0535 by Alphonzo Sari SAILOR, RN Outcome: Progressing   Problem: Nutrition: Goal: Adequate nutrition will be maintained 08/12/2024 0536 by Alphonzo Sari SAILOR, RN Outcome: Progressing 08/12/2024 0535 by Alphonzo Sari SAILOR, RN Outcome: Progressing   Problem: Coping: Goal: Level of anxiety will decrease 08/12/2024 0536 by Alphonzo Sari SAILOR, RN Outcome: Progressing 08/12/2024 0535 by Alphonzo Sari SAILOR, RN Outcome: Progressing   Problem: Elimination: Goal: Will not experience complications related to bowel motility 08/12/2024 0536 by Alphonzo Sari SAILOR, RN Outcome: Progressing 08/12/2024 0535 by Alphonzo Sari SAILOR, RN Outcome: Progressing Goal: Will not experience complications related to urinary retention 08/12/2024 0536 by Alphonzo Sari SAILOR, RN Outcome: Progressing 08/12/2024 0535 by Alphonzo Sari SAILOR, RN Outcome: Progressing   Problem: Pain Managment: Goal: General experience of comfort will improve and/or be controlled 08/12/2024 0536 by Alphonzo Sari SAILOR, RN Outcome: Progressing 08/12/2024 0535 by Alphonzo Sari SAILOR, RN Outcome: Progressing   Problem: Safety: Goal: Ability to remain free from injury will improve 08/12/2024 0536 by Alphonzo Sari SAILOR, RN Outcome: Progressing 08/12/2024 0535 by Alphonzo Sari SAILOR, RN Outcome: Progressing   Problem: Skin Integrity: Goal: Risk for impaired skin integrity will decrease 08/12/2024 0536 by Alphonzo Sari SAILOR, RN Outcome: Progressing 08/12/2024 0535 by Alphonzo Sari SAILOR, RN Outcome: Progressing   "

## 2024-08-12 NOTE — TOC Initial Note (Signed)
 Transition of Care [facility]) - Initial/Assessment Note    Patient Details  Name: [name] MRN: [id number] Date of Birth: 03/21/1946  [facility] CM/SW Contact:    [name], RN Phone Number: 08/12/2024, 1:43 PM  Clinical Narrative:                 RNCM assessed patient. Son at bedside. Patient says he lives at home with his wife in a single wide trailer. Son and his family live in adjoining property. Family members will drive patient to appointments. Uses [facility] for pharmacy. No difficulty affording medications. No current HH services. Endorses having walker, cane, rollator in home.  Will follow for recommendations    Barriers to Discharge: Continued Medical Work up   Patient Goals and CMS Choice            Expected Discharge Plan and Services                         DME Arranged: Oxygen DME Agency: [employer]                  Prior Living Arrangements/Services   Lives with:: Spouse Patient language and need for interpreter reviewed:: Yes Do you feel safe going back to the place where you live?: Yes      Need for Family Participation in Patient Care: Yes (Comment) Care giver support system in place?: Yes (comment) Current home services: DME Criminal Activity/Legal Involvement Pertinent to Current Situation/Hospitalization: No - Comment as needed  Activities of Daily Living      Permission Sought/Granted Permission sought to share information with : Case Manager, Magazine Features Editor Permission granted to share information with : Yes, Verbal Permission Granted              Emotional Assessment Appearance:: Appears stated age Attitude/Demeanor/Rapport: Gracious, Engaged Affect (typically observed): Appropriate Orientation: : Oriented to Self, Oriented to Place, Oriented to  Time, Oriented to Situation Alcohol / Substance Use: Not Applicable Psych Involvement: No (comment)  Admission diagnosis:  Acute on  chronic systolic CHF (congestive heart failure) (HCC) [I50.23] Anemia, unspecified type [D64.9] Chest pain, unspecified type [R07.9] Acute heart failure with preserved ejection fraction (HCC) [I50.31] Acute heart failure with preserved ejection fraction (HFpEF) (HCC) [I50.31] Patient Active Problem List   Diagnosis Date Noted   Acute heart failure with preserved ejection fraction (HCC) 08/11/2024   Lymphadenopathy 07/21/2024   Acute on chronic heart failure with preserved ejection fraction (HCC) 07/19/2024   Possible fluid overload due to blood transfusion (11/20. 11/22, 07/16/2024) 07/18/2024   Anemia in chronic kidney disease 04/14/2024   Other specified anemias 03/27/2024   Chest pain 03/02/2024   Diabetes mellitus type 2, noninsulin dependent (HCC) 03/02/2024   CKD (chronic kidney disease), stage IV (HCC) 03/02/2024   Respiratory arrest (HCC) 03/02/2024   Unresponsiveness 03/02/2024   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/2024   IDA (iron  deficiency anemia) 12/16/2023   Left ventricular hypertrophy 12/16/2023   Thrombocytopenia 12/16/2023   Stage 4 chronic kidney disease (HCC) 12/16/2023   Anemia of chronic disease 12/16/2023   Pleural effusion, bilateral 12/14/2023   Chronic diastolic CHF (congestive heart failure) (HCC) 11/12/2023   Murmur, cardiac 11/12/2023   Polyp of transverse colon 10/19/2023   Gastric polyps 10/19/2023   Acute exacerbation of CHF (congestive heart failure) (HCC) 10/16/2023   Severe anemia 10/16/2023   Dyspnea on exertion 10/16/2023   Personal history of other  malignant neoplasm of skin 10/16/2023   Moderate to severe mitral and tricuspid regurgitation 09/15/2023   Melena 08/26/2023   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   CHF exacerbation (HCC) 08/22/2023   Upper GI bleed 08/22/2023   Recurrent pleural effusion on right 08/22/2023   Symptomatic anemia 08/22/2023   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/01/2023    Status post Mohs surgery 05/01/2023   Right inguinal hernia 02/19/2023   Umbilical hernia without obstruction and without gangrene 02/19/2023   Hyponatremia 12/26/2022   Acute renal failure superimposed on stage 3b chronic kidney disease (HCC) 12/26/2022   Severe pulmonary hypertension (HCC) 12/26/2022   Vitamin D  deficiency 12/26/2022   Acute hypoxic respiratory failure (HCC) 12/26/2022   Severe mitral regurgitation 12/26/2022   Severe tricuspid regurgitation 12/26/2022   Acute heart failure with preserved ejection fraction (HFpEF) (HCC) 12/20/2022   Chronic venous stasis 12/20/2022   Paroxysmal atrial fibrillation (HCC) 12/20/2022   Iron  deficiency anemia 05/17/2021   Thyroid  nodule 11/04/2020   Lymphedema 10/27/2020   Gastric mass    Polyp of sigmoid colon    Acute blood loss anemia 09/04/2020   COVID-19 virus infection    Hypercalcemia    Pica    Acute on chronic blood loss anemia 09/02/2020   Hyperlipidemia associated with type 2 diabetes mellitus (HCC) 09/02/2020   S/P drug eluting coronary stent placement 06/09/2020   Other hyperlipidemia 08/18/2019   T2DM (type 2 diabetes mellitus) (HCC) 01/14/2017   Essential hypertension 01/14/2017   OSA (obstructive sleep apnea) 01/14/2017   At risk for fluid volume overload 09/08/2016   BBB (bundle branch block) 08/30/2016   Obesity with body mass index (BMI) of 30.0 to 39.9 08/30/2016   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   1st degree AV block 08/30/2016   CAD (coronary artery disease), s/p CABG x 3 08/20/2016   History of colonic polyps 11/05/2013   PCP:  [name], MD Pharmacy:   [phone] [place] [place] [facility] [address] [place] [address] Phone: [phone] Fax: [phone]  [facility] [address] [place] [address] Phone: [phone] Fax: [phone]     Social Drivers of Health (SDOH) Social History: SDOH Screenings   Food Insecurity: No  Food Insecurity (08/11/2024)  Housing: Unknown (08/11/2024)  Transportation Needs: No Transportation Needs (08/11/2024)  Utilities: Not At Risk (08/11/2024)  Depression (PHQ2-9): Low Risk (07/16/2024)  Financial Resource Strain: Low Risk  (05/08/2024)   Received from [facility]  Social Connections: Unknown (08/11/2024)  Recent Concern: Social Connections - Moderately Isolated (07/18/2024)  Tobacco Use: Medium Risk (08/10/2024)   SDOH Interventions:     Readmission Risk Interventions    08/12/2024    1:40 PM 07/20/2024   12:29 PM  Readmission Risk Prevention Plan  Transportation Screening Complete Complete  PCP or Specialist Appt within 3-5 Days  --  HRI or Home Care Consult  Complete  Social Work Consult for Recovery Care Planning/Counseling  Complete  Medication Review Oceanographer) Complete Complete  PCP or Specialist appointment within 3-5 days of discharge Complete   HRI or Home Care Consult Complete   SW Recovery Care/Counseling Consult Complete   Palliative Care Screening Not Applicable   Skilled Nursing Facility Complete

## 2024-08-12 NOTE — Progress Notes (Signed)
 OT Cancellation Note  Patient Details Name: [name] MRN: [id number] DOB: [birth year]   Cancelled Treatment:    Reason Eval/Treat Not Completed: Patient declined, no reason specified. Chart reviewed. Attempted x2, in AM pt declined citing fatigue and shortness of breath, PM attempt pt reports pending blood transfusion and requesting to hold until next date. Will follow and initiate services as pt appropriate.   Elston Slot, M.S. OTR/L  08/12/2024, 2:24 PM  ascom [phone]

## 2024-08-12 NOTE — Progress Notes (Signed)
 " [facility] CLINIC CARDIOLOGY PROGRESS NOTE       Patient ID: [name] MRN: [id number] DOB/AGE: March 12, 1946 78 y.o.  Admit date: 08/10/2024 Referring Physician Isaiah Lever, NP Primary Physician [name], MD  Primary Cardiologist Dr. Ammon Reason for Consultation AoCHF  HPI: [name] is a 78 y.o. 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 Cardiology was consulted for further evaluation.   Interval history: -Patient seen and examined this AM, sitting upright on side of hospital bed.  -Reports feeling more fatigued today. SOB stable, LE edema improving.  -BP and HR stable, renal function stable.  -Hgb down to 7.1 this AM, to get transfusion today.  Review of systems complete and found to be negative unless listed above    Past Medical History:  Diagnosis Date   (HFpEF) heart failure with preserved ejection fraction (HCC) 07/25/2016   a.)TTE 07/25/16: EF >55, triv-mild pan regur, mild AS, G2DD; b.)TTE 09/02/16: EF >55, triv TR, mild AS; c.)TTE 11/05/16: EF >55, LAE, triv-mild pan regur, mild AS, G2DD; d.)TTE 01/21/20: EF >55, mod LVH, LAE/RVE, triv-mild AR/PR/TR, mod MR, mild AS, G2DD; e.)TTE 09/24/22: EF>55, sev LVH, sev LAE, RAE, mild PR, mod AR/MR/TR, mild-mod AS, RVSP 74.7; f.)TTE 12/23/22: EF 60-65, LAE, mild-mod MR/TR, mild AS   Abnormal urine 05/01/2023   Anemia    Aortic atherosclerosis    Aortic stenosis 07/25/2016   a.) TTE 07/25/2016: mild (MPG 13.5); b.) TTE 09/02/2016: mild (MPG 16); c.) TTE 11/05/2016: mild (MPG 9); d.) TTE 01/21/2020: mild (MPG 11.7); e.) TTE 09/24/2022: mild-mod (MPG 20.3); f.) TTE 12/23/2022: mild (MPG 15)   Atrial fibrillation (HCC)     a.) CHA2DS2VASc = 6 (age x2, CHF, HTN, vascular disease history, T2DM);  b.) rate/rhythm maintained on oral labetalol ; chronically anticoagulated with apixaban  + clopidogrel    AV block, 1st degree    B12 deficiency    CKD (chronic kidney disease), stage III (HCC)    Colon polyps    Congestive heart failure (HCC) 05/01/2023   Coronary artery disease 08/16/2016   a.) s/p 3v CABG 08/30/2016; b.) s/p PCI 11/26/2016 (DES x 1 oSVG-RCA); c.) s/p PCI 12/17/2016 (DES x 5 --> mLCx x2, dLCx, pLAD, dLAD); c.) s/p PCI 06/09/2020 (DES x1 --> ISR oSVG-PDA)   Cyst of kidney, acquired 07/03/2023   Dyspnea    Gastroesophageal reflux disease 05/01/2023   GERD (gastroesophageal reflux disease)    Heart murmur    Hepatic steatosis    History of 2019 novel coronavirus disease (COVID-19) 09/02/2020   a.) Tx'd with remdesivir    History of blood transfusion    History of GI bleed 09/02/2020   a.) admitted to [facility] 09/02/2020 - 09/08/2020   History of heart artery stent 11/26/2016   TOTAL of 7 stents: a.) 11/26/2016 --> 3.5 x 22 mm Resolute Integrity oSVG-RCA; b.) 12/17/2016: 3.0 x 12 mm Xience Alpine dLCx, 3.5 x 22 mm Resolute Onyx mLCx, 3.5 x 18 mm Resolute Onyx mLCx, 3.0 x 26 mm Resolute Onyx pLAD, 2.0 x 26 mm Resolute Onyx dLAD; c.) 06/09/2020 --> 2.5 x 18 mm Resolute Onyx ISR oSVG-PDA   History of partial colectomy 08/12/2012   a.) large gastric hyperplastic polyp removal   HLD (hyperlipidemia)    Hypertension  LBBB (left bundle branch block)    Long term current use of anticoagulant    a.) apixaban    Long term current use of antithrombotics/antiplatelets    a.) clopidogrel    Male hypogonadism    a.) on exogenous TRT (1.62% gel)   Mild cardiomegaly    Mild gynecomastia (bilateral)    Nephrolithiasis    OSA (obstructive sleep apnea)    a.) does not utilize nocturnal PAP therapy   Personal history of surgery to heart and great vessels, presenting hazards to health 05/01/2023   Proteinuria,  unspecified 08/01/2023   Pulmonary hypertension (HCC) 09/24/2022   a.) TTE 09/24/2022: RVSP 74.7; b.) TTE 12/23/2022: RVSP 31.3   RBBB (right bundle branch block with left anterior fascicular block)    Right inguinal hernia    Right thyroid  nodule    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   a.) LIMA-LAD, SVG-PDA, SVG-OM3   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/01/2023   Type 2 diabetes mellitus treated with insulin  (HCC)    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   Umbilical hernia    Unstable angina (HCC)    Vitamin D  deficiency     Past Surgical History:  Procedure Laterality Date   ANOMALOUS PULMONARY VENOUS RETURN REPAIR, TOTAL  2025   APPENDECTOMY     CARDIAC CATHETERIZATION Left 08/16/2016   Procedure: Left Heart Cath and Coronary Angiography;  Surgeon: Cara JONETTA Lovelace, MD;  Location: [facility] INVASIVE CV LAB;  Service: Cardiovascular;  Laterality: Left;   COLONOSCOPY N/A 09/07/2020   Procedure: COLONOSCOPY;  Surgeon: Janalyn Keene NOVAK, MD;  Location: [facility] ENDOSCOPY;  Service: Endoscopy;  Laterality: N/A;   COLONOSCOPY N/A 10/19/2023   Procedure: COLONOSCOPY;  Surgeon: Jinny Carmine, MD;  Location: [facility] ENDOSCOPY;  Service: Endoscopy;  Laterality: N/A;   COLONOSCOPY WITH PROPOFOL  N/A 05/17/2021   Procedure: COLONOSCOPY WITH PROPOFOL ;  Surgeon: Janalyn Keene NOVAK, MD;  Location: [facility] ENDOSCOPY;  Service: Endoscopy;  Laterality: N/A;   CORONARY ANGIOPLASTY WITH STENT PLACEMENT Left 11/26/2016   Procedure: CORONARY ANGIOPLASTY WITH STENT PLACEMENT; Location: [facility]; Surgeon: Alm Dais, MD   CORONARY ARTERY BYPASS GRAFT N/A 08/30/2016   Procedure: CORONARY ARTERY BYPASS GRAFT; Location: [facility]; Surgeon: Juliene Pouch, MD   CORONARY STENT INTERVENTION N/A 06/09/2020   Procedure: CORONARY STENT INTERVENTION;  Surgeon: Lovelace Cara JONETTA, MD;  Location: [facility] INVASIVE CV LAB;  Service: Cardiovascular;  Laterality: N/A;   CORONARY STENT INTERVENTION Left  12/17/2016   Procedure: CORONARY STENT INTERVENTION; Location: [facility]; Surgeon: Alm Dais, MD   CORONARY STENT INTERVENTION  03/2024   ENDOSCOPIC MUCOSAL RESECTION N/A 09/22/2020   Procedure: ENDOSCOPIC MUCOSAL RESECTION;  Surgeon: [name] Aloha Raddle., MD;  Location: [facility] ENDOSCOPY;  Service: Gastroenterology;  Laterality: N/A;   ESOPHAGOGASTRODUODENOSCOPY N/A 09/07/2020   Procedure: ESOPHAGOGASTRODUODENOSCOPY (EGD);  Surgeon: Janalyn Keene NOVAK, MD;  Location: [facility] ENDOSCOPY;  Service: Endoscopy;  Laterality: N/A;   ESOPHAGOGASTRODUODENOSCOPY N/A 10/19/2023   Procedure: EGD (ESOPHAGOGASTRODUODENOSCOPY);  Surgeon: Jinny Carmine, MD;  Location: [facility] ENDOSCOPY;  Service: Endoscopy;  Laterality: N/A;   ESOPHAGOGASTRODUODENOSCOPY (EGD) WITH PROPOFOL  N/A 09/22/2020   Procedure: ESOPHAGOGASTRODUODENOSCOPY (EGD) WITH PROPOFOL ;  Surgeon: [name] Aloha Raddle., MD;  Location: [facility] ENDOSCOPY;  Service: Gastroenterology;  Laterality: N/A;   ESOPHAGOGASTRODUODENOSCOPY (EGD) WITH PROPOFOL  N/A 08/26/2023   Procedure: ESOPHAGOGASTRODUODENOSCOPY (EGD) WITH PROPOFOL ;  Surgeon: Jinny Carmine, MD;  Location: [facility] ENDOSCOPY;  Service: Endoscopy;  Laterality: N/A;   FRACTURE SURGERY     HEMOSTASIS CLIP PLACEMENT  09/22/2020   Procedure: HEMOSTASIS  CLIP PLACEMENT;  Surgeon: Mansouraty, Aloha Raddle., MD;  Location: [facility] ENDOSCOPY;  Service: Gastroenterology;;   HEMOSTASIS CONTROL  09/22/2020   Procedure: HEMOSTASIS CONTROL;  Surgeon: [name] Aloha Raddle., MD;  Location: [facility] ENDOSCOPY;  Service: Gastroenterology;;   INSERTION OF MESH  02/19/2023   Procedure: INSERTION OF MESH;  Surgeon: Desiderio Schanz, MD;  Location: [facility] ORS;  Service: General;;  umbilical   LAPAROSCOPIC PARTIAL RIGHT COLECTOMY Right 08/12/2012   Procedure: LAPAROSCOPIC PARTIAL RIGHT COLECTOMY; Location: [facility]; Surgeon: Unknown Sharps, MD   LEFT HEART CATH AND CORONARY ANGIOGRAPHY Left 06/09/2020   Procedure: LEFT HEART CATH AND CORONARY ANGIOGRAPHY;   Surgeon: Florencio Cara BIRCH, MD;  Location: [facility] INVASIVE CV LAB;  Service: Cardiovascular;  Laterality: Left;   LEFT HEART CATH AND CORS/GRAFTS ANGIOGRAPHY Left 11/20/2016   Procedure: Left Heart Cath and Cors/Grafts Angiography;  Surgeon: Cara BIRCH Florencio, MD;  Location: [facility] INVAS[facility] CV LAB;  Service: Cardiovascular;  Laterality: Left;   POLYPECTOMY  09/22/2020   Procedure: POLYPECTOMY;  Surgeon: [name] Aloha Raddle., MD;  Location: [facility] ENDOSCOPY;  Service: Gastroenterology;;   POLYPECTOMY  10/19/2023   Procedure: POLYPECTOMY;  Surgeon: Jinny Carmine, MD;  Location: [facility] ENDOSCOPY;  Service: Endoscopy;;   RIGHT HEART CATH Right 07/01/2024   Procedure: RIGHT HEART CATH;  Surgeon: Ammon Blunt, MD;  Location: [facility]VAS[facility] CV LAB;  Service: Cardiovascular;  Laterality: Right;   SUBMUCOSAL LIFTING INJECTION  09/22/2020   Procedure: SUBMUCOSAL LIFTING INJECTION;  Surgeon: [name] Aloha Raddle., MD;  Location: [facility] ENDOSCOPY;  Service: Gastroenterology;;   UMBILICAL HERNIA REPAIR N/A 02/19/2023   Procedure: HERNIA REPAIR UMBILICAL ADULT, open;  Surgeon: Desiderio Schanz, MD;  Location: [facility] ORS;  Service: General;  Laterality: N/A;   XI ROBOTIC ASSISTED INGUINAL HERNIA REPAIR WITH MESH Right 02/19/2023   Procedure: XI ROBOTIC ASSISTED INGUINAL HERNIA REPAIR WITH MESH;  Surgeon: Desiderio Schanz, MD;  Location: [facility] ORS;  Service: General;  Laterality: Right;    Medications Prior to Admission  Medication Sig Dispense Refill Last Dose/Taking   amLODipine  (NORVASC ) 10 MG tablet Take 10 mg by mouth daily.   08/09/2024   aspirin  EC 81 MG tablet Take 81 mg by mouth daily.   08/09/2024   atorvastatin  (LIPITOR ) 80 MG tablet Take 80 mg by mouth.  Take 80 mg by mouth in the morning.   08/09/2024   bisacodyl  (DULCOLAX) 5 MG EC tablet Take 5 mg by mouth as needed for moderate constipation.   Unknown   carvedilol  (COREG ) 3.125 MG tablet Take 1 tablet (3.125 mg total) by mouth 2 (two) times daily with a  meal. 60 tablet 1 08/09/2024   cyanocobalamin  (VITAMIN B12) 1000 MCG tablet Take 1,000 mcg by mouth daily.   08/09/2024   dexlansoprazole (DEXILANT) 60 MG capsule Take 1 capsule by mouth daily.   08/09/2024   empagliflozin  (JARDIANCE ) 10 MG TABS tablet Take 1 tablet (10 mg total) by mouth daily before breakfast. 30 tablet 0 08/09/2024   Ferrous Sulfate  (IRON ) 325 (65 Fe) MG TABS Take 1 tablet by mouth daily.   08/09/2024   isosorbide  mononitrate (IMDUR ) 120 MG 24 hr tablet Take 120 mg by mouth daily.   08/09/2024   lactulose  (CHRONULAC ) 10 GM/15ML solution Taking 15-20 cc by mouth 3 to 4 times a day for constiption (Patient taking differently: Taking 15-20 cc by mouth 3 to 4 times a day for constiption as needed) 946 mL 2 08/09/2024   nitroGLYCERIN  (NITROSTAT ) 0.4 MG SL tablet Place 0.4 mg under the tongue every 5 (five)  minutes as needed for chest pain.   Unknown   torsemide  (DEMADEX ) 20 MG tablet Take 2 tablets (40 mg total) by mouth daily. 60 tablet 0 Unknown   clopidogrel  (PLAVIX ) 75 MG tablet Take 75 mg by mouth daily. (Patient not taking: Reported on 08/10/2024)   Not Taking   Social History   Socioeconomic History   Marital status: Married    Spouse name: Not on file   Number of children: Not on file   Years of education: Not on file   Highest education level: Not on file  Occupational History   Not on file  Tobacco Use   Smoking status: Former    Current packs/day: 0.00    Types: Cigarettes    Quit date: 2000    Years since quitting: 26.0    Passive exposure: Never   Smokeless tobacco: Never  Vaping Use   Vaping status: Never Used  Substance and Sexual Activity   Alcohol use: No   Drug use: No   Sexual activity: Not Currently  Other Topics Concern   Not on file  Social History Narrative   Not on file   Social Drivers of Health   Tobacco Use: Medium Risk (08/10/2024)   Patient History    Smoking Tobacco Use: Former    Smokeless Tobacco Use: Never    Passive  Exposure: Never  Physicist, Medical Strain: Low Risk  (05/08/2024)   Received from [facility]   Overall Financial Resource Strain (CARDIA)    Difficulty of Paying Living Expenses: Not hard at all  Food Insecurity: No Food Insecurity (08/11/2024)   Epic    Worried About Radiation Protection Practitioner of Food in the Last Year: Never true    Ran Out of Food in the Last Year: Never true  Transportation Needs: No Transportation Needs (08/11/2024)   Epic    Lack of Transportation (Medical): No    Lack of Transportation (Non-Medical): No  Physical Activity: Not on file  Stress: Not on file  Social Connections: Unknown (08/11/2024)   Social Connection and Isolation Panel    Frequency of Communication with Friends and Family: More than three times a week    Frequency of Social Gatherings with Friends and Family: More than three times a week    Attends Religious Services: Not on Marketing Executive or Organizations: Not on file    Attends Banker Meetings: Not on file    Marital Status: Not on file  Recent Concern: Social Connections - Moderately Isolated (07/18/2024)   Social Connection and Isolation Panel    Frequency of Communication with Friends and Family: More than three times a week    Frequency of Social Gatherings with Friends and Family: More than three times a week    Attends Religious Services: Never    Database Administrator or Organizations: No    Attends Banker Meetings: Never    Marital Status: Married  Catering Manager Violence: Unknown (08/11/2024)   Epic    Fear of Current or Ex-Partner: No    Emotionally Abused: No    Physically Abused: Not on file    Sexually Abused: Not on file  Depression (PHQ2-9): Low Risk (07/16/2024)   Depression (PHQ2-9)    PHQ-2 Score: 0  Alcohol Screen: Not on file  Housing: Unknown (08/11/2024)   Epic    Unable to Pay for Housing in the Last Year: No    Number of Times Moved in  the Last Year: 0     Homeless in the Last Year: Not on file  Utilities: Not At Risk (08/11/2024)   Epic    Threatened with loss of utilities: No  Health Literacy: Not on file    Family History  Problem Relation Age of Onset   Hyperlipidemia Mother    Heart disease Mother    Hypertension Father      Vitals:   08/12/24 0500 08/12/24 0726 08/12/24 0916 08/12/24 1110  BP:  (!) 143/67 (!) 160/79 124/64  Pulse:  (!) 55 65 67  Resp:  20  18  Temp:  98.3 F (36.8 C)  98.1 F (36.7 C)  TempSrc:      SpO2:  98%  97%  Weight: 86.1 kg     Height:        PHYSICAL EXAM General: Chronically ill-appearing male, well nourished, in no acute distress. HEENT: Normocephalic and atraumatic. Neck: No JVD.  Lungs: Normal respiratory effort on 3L [place]. Clear bilaterally to auscultation. No wheezes, crackles, rhonchi.  Heart: HRRR. Normal S1 and S2 without gallops or murmurs.  Abdomen: Non-distended appearing.  Msk: Normal strength and tone for age. Extremities: Warm and well perfused. No clubbing, cyanosis.  3+ pitting edema.  Neuro: Alert and oriented X 3. Psych: Answers questions appropriately.   Labs: Basic Metabolic Panel: Recent Labs    08/11/24 0521 08/12/24 0408  NA 141 139  K 4.5 4.3  CL 102 100  CO2 31 30  GLUCOSE 152* 153*  BUN 38* 42*  CREATININE 1.74* 1.78*  CALCIUM  10.2 10.0   Liver Function Tests: Recent Labs    08/10/24 0732  AST 45*  ALT 12  ALKPHOS 129*  BILITOT 1.9*  PROT 8.1  ALBUMIN 4.2   No results for input(s): LIPASE, AMYLASE in the last 72 hours. CBC: Recent Labs    08/10/24 0732 08/12/24 0408  WBC 8.9 5.7  HGB 8.0* 7.1*  HCT 26.1* 22.3*  MCV 102.8* 101.8*  PLT 159 111*   Cardiac Enzymes: No results for input(s): CKTOTAL, CKMB, CKMBINDEX, TROPONINIHS in the last 72 hours. BNP: No results for input(s): BNP in the last 72 hours. D-Dimer: No results for input(s): DDIMER in the last 72 hours. Hemoglobin A1C: No results for input(s): HGBA1C in  the last 72 hours. Fasting Lipid Panel: No results for input(s): CHOL, HDL, LDLCALC, TRIG, CHOLHDL, LDLDIRECT in the last 72 hours. Thyroid  Function Tests: No results for input(s): TSH, T4TOTAL, T3FREE, THYROIDAB in the last 72 hours.  Invalid input(s): FREET3 Anemia Panel: No results for input(s): VITAMINB12, FOLATE, FERRITIN, TIBC, IRON , RETICCTPCT in the last 72 hours.   Radiology: [facility] Chest Port 1 View Result Date: 08/10/2024 EXAM: 1 VIEW(S) XRAY OF THE CHEST 08/10/2024 04:14:18 PM COMPARISON: 08/10/2024 CLINICAL HISTORY: S/P thoracentesis FINDINGS: LINES, TUBES AND DEVICES: TAVR in place. CABG markers noted. LUNGS AND PLEURA: Mild pulmonary edema, unchanged. Stable small to moderate bilateral pleural effusions. Unchanged bibasilar opacities. No pneumothorax. HEART AND MEDIASTINUM: Stable cardiomegaly. Aortic atherosclerosis. BONES AND SOFT TISSUES: Median sternotomy wires noted. No acute osseous abnormality. IMPRESSION: 1. Mild pulmonary edema, unchanged. 2. Stable small to moderate bilateral pleural effusions. Electronically signed by: Greig Pique MD 08/10/2024 06:13 PM EST RP Workstation: [id number]   US  THORACENTESIS ASP PLEURAL SPACE W/IMG GUIDE Result Date: 08/10/2024 INDICATION: 78 year old male with an extensive cardiac history who presented to the ED today with worsening exertional chest pain and dyspnea on exertion. Workup consistent with heart failure exacerbation and imaging concerning for moderate  bilateral pleural effusions with mild pulmonary edema and bibasilar atelectasis. Request for diagnostic and therapeutic thoracentesis. EXAM: ULTRASOUND GUIDED DIAGNOSTIC AND THERAPEUTIC, LEFT-SIDED THORACENTESIS MEDICATIONS: 1% lidocaine , 6 mL. COMPLICATIONS: None immediate. PROCEDURE: An ultrasound guided thoracentesis was thoroughly discussed with the patient and questions answered. The benefits, risks, alternatives and complications were also discussed.  The patient understands and wishes to proceed with the procedure. Written consent was obtained. Ultrasound was performed to localize and mark an adequate pocket of fluid in the left chest. The area was then prepped and draped in the normal sterile fashion. 1% Lidocaine  was used for local anesthesia. Under ultrasound guidance a 6 Fr Safe-T-Centesis catheter was introduced. Thoracentesis was performed. The catheter was removed and a dressing applied. FINDINGS: A total of approximately 700 mL of serous sanguinous fluid was removed. Samples were sent to the laboratory as requested by the clinical team. IMPRESSION: Successful ultrasound guided left-sided thoracentesis yielding 700 mL of pleural fluid. Procedure performed by: Sherrilee Bal, PA-C under the supervision of Dr. VEAR Lent Electronically Signed   By: Wilkie Lent M.D.   On: 08/10/2024 16:38   DG Chest 2 View Result Date: 08/10/2024 EXAM: 2 VIEW(S) XRAY OF THE CHEST 08/10/2024 08:36:00 AM COMPARISON: 08/01/2024 CLINICAL HISTORY: dyspnea, cp FINDINGS: LUNGS AND PLEURA: Moderate bilateral pleural effusions. Mild pulmonary edema. Bibasilar atelectasis/airspace disease. No pneumothorax. HEART AND MEDIASTINUM: Stable CABG markers. TAVR noted. Aortic atherosclerosis. BONES AND SOFT TISSUES: Sternotomy wires noted. No acute osseous abnormality. IMPRESSION: 1. Moderate bilateral pleural effusions with mild pulmonary edema and bibasilar atelectasis/airspace disease. Electronically signed by: Waddell Calk MD 08/10/2024 08:41 AM EST RP Workstation: [id number]   DG Chest 1 View Result Date: 08/01/2024 EXAM: 1 VIEW(S) XRAY OF THE CHEST 08/01/2024 09:32:09 AM COMPARISON: 07/20/2024 CLINICAL HISTORY: SOB (shortness of breath) FINDINGS: LUNGS AND PLEURA: Bibasilar airspace opacities are stable. Mild pulmonary edema is present. Moderate right pleural effusion, slightly decreased. Similar small left pleural effusion. No pneumothorax. HEART AND MEDIASTINUM:  Stable cardiomegaly. Aortic atherosclerotic calcification. Aortic valve replacement is noted. BONES AND SOFT TISSUES: Median sternotomy is noted. No acute osseous abnormality. IMPRESSION: 1. Moderate right pleural effusion, slightly decreased, and similar small left pleural effusion. 2. Stable bibasilar airspace opacities and mild pulmonary edema. 3. Stable cardiomegaly and aortic atherosclerotic calcification. 4. Postsurgical changes of median sternotomy and aortic valve replacement. Electronically signed by: Evalene Coho MD 08/01/2024 09:37 AM EST RP Workstation: [id number]   CT CHEST ABDOMEN PELVIS WO CONTRAST Result Date: 07/20/2024 EXAM: CT CHEST, ABDOMEN AND PELVIS WITHOUT CONTRAST 07/20/2024 06:52:00 PM TECHNIQUE: CT of the chest, abdomen and pelvis was performed without the administration of intravenous contrast. Multiplanar reformatted images are provided for review. Automated exposure control, iterative reconstruction, and/or weight based adjustment of the mA/kV was utilized to reduce the radiation dose to as low as reasonably achievable. COMPARISON: 08/20/2023 CLINICAL HISTORY: LDH elevation. Oncology wanted to see if cancerous process. Okay for oral contrast but not intravenous. FINDINGS: CHEST: MEDIASTINUM AND LYMPH NODES: Status post transcatheter aortic valve replacement and coronary artery bypass grafting. Mild cardiomegaly. No pericardial effusion. The central pulmonary arteries are enlarged in keeping with changes of pulmonary arterial hypertension. Mild atherosclerotic calcification within the thoracic aorta. No aortic aneurysm. There is progressive pathologic mediastinal adenopathy. By example, previously measured left prevascular and precarinal lymph nodes now measure 12 mm (previously 10 mm) and 20 mm (previously 10 mm) in short axis diameter. The findings suggest either a chronic inflammatory or low-grade lymphoproliferative process. Stable 2.3 cm right thyroid  nodule, better assessed  on thyroid  sonogram of 10/12/2020. No follow up imaging is recommended. LUNGS AND PLEURA: Small to moderate bilateral pleural effusions, right greater than left, appears similar to prior examination though there is new pleural thickening bilaterally as well as probable septation noted at the right basilar pleural space which is nonspecific in the setting of repeated thoracentesis. Small focus of pleural gas on the right may relate to recent thoracentesis. No focal consolidation or pulmonary edema. No pneumothorax. ABDOMEN AND PELVIS: LIVER: The liver is unremarkable. GALLBLADDER AND BILE DUCTS: Cholelithiasis without superimposed pericholecystic inflammatory change. No intra or extrahepatic biliary ductal dilation. SPLEEN: No acute abnormality. PANCREAS: No acute abnormality. ADRENAL GLANDS: No acute abnormality. KIDNEYS, URETERS AND BLADDER: Simple exophytic cortical cysts are seen within the kidneys bilaterally for which no follow up imaging is recommended. Of a hyperdense exophytic cortical cyst arises anteriorly but little lower pole of the right kidney but is unchanged from prior examination and was better characterized as a simple cyst on that exam. Per consensus, no follow-up is needed for simple Bosniak type 1 and 2 renal cysts, unless the patient has a malignancy history or risk factors. 7 mm nonobstructing calculus within the lower pole of the left kidney. The kidneys are otherwise unremarkable. No stones in the ureters. No hydronephrosis. No perinephric or periureteral stranding. Urinary bladder is unremarkable. GI AND BOWEL: Moderate stool burden within the distal colon and rectum without evidence of obstruction. The stomach, small bowel, and large bowel are otherwise unremarkable. Appendix absent. REPRODUCTIVE ORGANS: No acute abnormality. PERITONEUM AND RETROPERITONEUM: No ascites. No free air. VASCULATURE: Aorta is normal in caliber. Extensive aortic iliac atherosclerotic calcification. ABDOMINAL AND  PELVIS LYMPH NODES: Pathologic aortic cable adenopathy with a single lymph node measuring 15 mm in short axis diameter is stable since prior examination, nonspecific. No additional pathologic adenopathy within the abdomen and pelvis. REPRODUCTIVE ORGANS: No acute abnormality. BONES AND SOFT TISSUES: Osseous structures are age appropriate. No acute bone abnormality. No lytic or blastic bone lesion. No focal soft tissue abnormality. IMPRESSION: 1. Progressive pathologic mediastinal adenopathy, suggestive of a chronic inflammatory or low-grade lymphoproliferative process. 2. Small to moderate bilateral pleural effusions, right greater than left, with new bilateral pleural thickening and probable right basilar pleural space septation; small right pleural gas likely post-thoracentesis. 3. Stable pathologic aortocaval adenopathy measuring 15 mm in short axis, nonspecific. 4. Stable 2.3 cm right thyroid  nodule; no follow-up imaging is recommended. 5. Cholelithiasis without acute inflammatory change and without biliary ductal dilation. 6. Moderate stool burden in the distal colon and rectum without obstruction. 7. RAF score: Aortic Atherosclerosis (ICD10-I70.0). Electronically signed by: Dorethia Molt MD 07/20/2024 08:42 PM EST RP Workstation: [id number]   DG Chest Port 1 View Result Date: 07/20/2024 CLINICAL DATA:  Status post right thoracentesis. EXAM: PORTABLE CHEST 1 VIEW COMPARISON:  07/18/2024 FINDINGS: Small to moderate-sized right pleural effusion, decreased in size. No pneumothorax. No significant change in right basilar atelectasis and possible pneumonia. Stable small left pleural effusion. Increased linear and patchy density at the left lung base. Atheromatous aortic arch calcifications. Diffuse osteopenia. IMPRESSION: 1. Small to moderate-sized right pleural effusion, decreased in size following thoracentesis. 2. No pneumothorax. 3. No significant change in right basilar atelectasis and possible pneumonia. 4.  Increased left basilar atelectasis and possible pneumonia. 5. Stable small left pleural effusion. Electronically Signed   By: Elspeth Bathe M.D.   On: 07/20/2024 14:34   US  THORACENTESIS ASP PLEURAL SPACE W/IMG GUIDE Result Date: 07/20/2024 INDICATION: 78 year old male with an extensive cardiac  history, including heart failure and aortic valve replacement in 01/2024, and severe pulmonary hypertension with recurrent right-sided pleural effusions. Request for diagnostic and therapeutic thoracentesis. EXAM: ULTRASOUND GUIDED DIAGNOSTIC AND THERAPEUTIC, RIGHT-SIDED THORACENTESIS MEDICATIONS: 1% lidocaine , 8 mL. COMPLICATIONS: None immediate. PROCEDURE: An ultrasound guided thoracentesis was thoroughly discussed with the patient and questions answered. The benefits, risks, alternatives and complications were also discussed. The patient understands and wishes to proceed with the procedure. Written consent was obtained. Ultrasound was performed and was notable for several loculations throughout the right chest. An adequate pocket of fluid was identified in the right chest. The area was then prepped and draped in the normal sterile fashion. 1% Lidocaine  was used for local anesthesia. Under ultrasound guidance a 6 Fr Safe-T-Centesis catheter was introduced. Thoracentesis was performed. Unfortunately, due to the septations, only a small amount of fluid was able to be drained. Residual fluid noted to the right lateral chest on post procedure ultrasound. The catheter was removed and a dressing applied. FINDINGS: A total of approximately 450 mL of amber fluid was removed. Samples were sent to the laboratory as requested by the clinical team. IMPRESSION: Successful ultrasound guided right-sided thoracentesis yielding 450 mL of pleural fluid. Procedure performed by: Sherrilee Bal, PA-C under the supervision of Dr. ONEIDA Specking. Electronically Signed   By: CHRISTELLA.  Shick M.D.   On: 07/20/2024 11:14   DG Chest 2 View Result Date:  07/18/2024 CLINICAL DATA:  Chest pain and shortness of breath EXAM: CHEST - 2 VIEW COMPARISON:  07/02/2024 FINDINGS: Frontal and lateral views of the chest demonstrate stable postsurgical changes from median sternotomy and aortic valve replacement. Cardiac silhouette is enlarged but stable. Persistent bibasilar veiling opacities consistent with consolidation and effusions, right greater than left. No pneumothorax. No acute bony abnormalities. IMPRESSION: 1. Stable bibasilar consolidation and effusions, right greater than left. 2. Stable enlarged cardiac silhouette. Electronically Signed   By: Ozell Daring M.D.   On: 07/18/2024 14:53    ECHO 03/2024: NORMAL LEFT VENTRICULAR SYSTOLIC FUNCTION WITH SEVERE LVH  ESTIMATED EF: >55%, CALC EF(3D): 55%  ELEVATED LA PRESSURES W/ DIASTOLIC DYSFUNCTION (GRADE 3)  NORMAL RIGHT VENTRICULAR SYSTOLIC FUNCTION  VALVULAR REGURGITATION: MILD AR, MILD MR, TRIVIAL PR, MILD TR  ESTIMATED RVSP: 85 mmHg (ABNORMAL)  VALVULAR STENOSIS: TRANSCATHETER BIOPROSTHETIC, No MS, No PS, No TS   TELEMETRY (personally reviewed): AF rate 60s  EKG (personally reviewed): atrial fibrillation rate 75 bpm  Data reviewed by me 08/12/2024: last 24h vitals tele labs imaging I/O ED provider note, admission H&P  Principal Problem:   Acute heart failure with preserved ejection fraction (HFpEF) (HCC) Active Problems:   Acute heart failure with preserved ejection fraction (HCC)    ASSESSMENT AND PLAN:  [name] is a 78 y.o. 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 Cardiology was consulted for further evaluation.   # Acute hypoxic respiratory failure # Acute on chronic HFpEF #  Bilateral pleural effusions # Coronary artery disease s/p CABG # Demand ischemia # Aortic stenosis s/p TAVR 01/2024 # Chronic anemia Patient presented to ED for evaluation of SOB, CP. BNP elevated at 15,340. Trops mild and flat 67 > 73. Chest x-ray with bilateral pleural effusions. S/p thoracentesis 08/10/2024 with 700 cc removed.  -Continue  IV lasix  60 mg twice daily.  -Continue home jardiance  10 mg daily, carvedilol  3.125 mg twice daily. Consider addition of MRA pending renal function.  -Continue aspirin  81 mg daily and atorvastatin  80 mg daily.  -Continue amlodipine  10 mg daily and imdur  120 mg daily.  -Recommend Hgb goal >8.    This patient's plan of care was discussed and created with Dr. Wilburn and he is in agreement.  Signed: Danita Bloch, PA-C  08/12/2024, 11:21 AM [facility]      "

## 2024-08-12 NOTE — Progress Notes (Signed)
 PT Cancellation Note  Patient Details Name: [name] MRN: [id number] DOB: 1946-05-02   Cancelled Treatment:    Reason Eval/Treat Not Completed: Per OT patient declined to participate with rehab services this AM due to chronic knee pain and needing to get some fluid off. Will attempt to see pt at a future date/time as medically appropriate.    CHARM Glendia Bertin PT, DPT 08/12/2024, 11:04 AM

## 2024-08-12 NOTE — Progress Notes (Signed)
 PT Cancellation Note  Patient Details Name: [name] MRN: [id number] DOB: 1945-09-14   Cancelled Treatment:    Reason Eval/Treat Not Completed: Other (comment): Pt receiving transfusion at this time, will attempt to see pt at a future date/time as medically appropriate.    CHARM Glendia Bertin PT, DPT 08/12/2024, 3:26 PM

## 2024-08-12 NOTE — Progress Notes (Signed)
 "    PULMONOLOGY         Date: 08/12/2024,   MRN# [id number] [name] 18-Sep-1945     AdmissionWeight: 86.2 kg                 CurrentWeight: 86.1 kg  Referring provider: Dr Trudy   CHIEF COMPLAINT:   Acute hypoxemic respiratory failure   HISTORY OF PRESENT ILLNESS   This is a 78 y.o. 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  Discharged on 07/23/24 after presenting with respiratory symptoms secondary to recurrent right pleural effusion as well as symptomatic anemia.  He underwent a thoracentesis at that time and 450 cc of fluid was obtained.  CT chest, abdomen and pelvis demonstrated progressive mediastinal adenopathy suggestive of either chronic inflammatory process or low-grade lymphoproliferative process.  Subsequent cytology was negative for any malignancy.  He was discharged home on base level 4 L O2.  Because of the severity of pulmonary hypertension Watchman procedure has been delayed according to the patient.  He is currently not on anticoagulation due to recurrent GI bleeding and symptomatic anemia requiring transfusions.  Patient presents to ER with chest discomfort and dyspnea x 1wk. Reports medication noncompliance.  Had chest imaging with bilateral pleural effusions. Cardiology has been consulted and has initiated GDMT.  IR has been consulted and has performed thoracentesis with 700cc removed from left pleural space. PCCM consultation placed for additional evaluation and management of patient with worsening pulmonary hypertension and hypoxemic respiratory failure.   08/12/24- patient net  3L/negative now with diuresis in addition to the removed via left thora. He is on ambient air now during my evaluation . He is on BID 60mg  lasix  IV and his renal function is with stable CKD. Overall he is improving. No need for additional pulmonary hypertension meds at this time. He may need biopsy of his enlarged lymph nodes around lungs at hilar and mediastinal stations. This can be arranged on outpatient.   PAST MEDICAL HISTORY   Past Medical History:  Diagnosis Date   (HFpEF) heart failure with preserved ejection fraction (HCC) 07/25/2016   a.)TTE 07/25/16: EF >55, triv-mild pan regur, mild AS, G2DD; b.)TTE 09/02/16: EF >55, triv TR, mild AS; c.)TTE 11/05/16: EF >55, LAE, triv-mild pan regur, mild AS, G2DD; d.)TTE 01/21/20: EF >55, mod LVH, LAE/RVE, triv-mild AR/PR/TR, mod MR, mild AS, G2DD; e.)TTE 09/24/22: EF>55, sev LVH, sev LAE, RAE, mild PR, mod AR/MR/TR, mild-mod AS, RVSP 74.7; f.)TTE 12/23/22: EF 60-65, LAE, mild-mod MR/TR, mild AS   Abnormal urine 05/01/2023   Anemia    Aortic atherosclerosis    Aortic stenosis 07/25/2016   a.) TTE 07/25/2016: mild (MPG 13.5); b.) TTE 09/02/2016: mild (MPG 16); c.) TTE 11/05/2016: mild (MPG 9); d.) TTE 01/21/2020: mild (MPG 11.7); e.) TTE 09/24/2022: mild-mod (MPG 20.3); f.) TTE 12/23/2022: mild (MPG 15)   Atrial fibrillation (HCC)    a.) CHA2DS2VASc = 6 (age x2, CHF, HTN, vascular disease history, T2DM);  b.) rate/rhythm maintained on oral labetalol ; chronically anticoagulated with apixaban  + clopidogrel    AV block, 1st degree    B12 deficiency    CKD (chronic kidney disease), stage III (HCC)  Colon polyps    Congestive heart failure (HCC) 05/01/2023   Coronary artery disease 08/16/2016   a.) s/p 3v CABG 08/30/2016; b.) s/p PCI 11/26/2016 (DES x 1 oSVG-RCA); c.) s/p PCI 12/17/2016 (DES x 5 --> mLCx x2, dLCx, pLAD, dLAD); c.) s/p PCI 06/09/2020 (DES x1 --> ISR oSVG-PDA)   Cyst of kidney, acquired 07/03/2023   Dyspnea    Gastroesophageal reflux  disease 05/01/2023   GERD (gastroesophageal reflux disease)    Heart murmur    Hepatic steatosis    History of 2019 novel coronavirus disease (COVID-19) 09/02/2020   a.) Tx'd with remdesivir    History of blood transfusion    History of GI bleed 09/02/2020   a.) admitted to [facility] 09/02/2020 - 09/08/2020   History of heart artery stent 11/26/2016   TOTAL of 7 stents: a.) 11/26/2016 --> 3.5 x 22 mm Resolute Integrity oSVG-RCA; b.) 12/17/2016: 3.0 x 12 mm Xience Alpine dLCx, 3.5 x 22 mm Resolute Onyx mLCx, 3.5 x 18 mm Resolute Onyx mLCx, 3.0 x 26 mm Resolute Onyx pLAD, 2.0 x 26 mm Resolute Onyx dLAD; c.) 06/09/2020 --> 2.5 x 18 mm Resolute Onyx ISR oSVG-PDA   History of partial colectomy 08/12/2012   a.) large gastric hyperplastic polyp removal   HLD (hyperlipidemia)    Hypertension    LBBB (left bundle branch block)    Long term current use of anticoagulant    a.) apixaban    Long term current use of antithrombotics/antiplatelets    a.) clopidogrel    Male hypogonadism    a.) on exogenous TRT (1.62% gel)   Mild cardiomegaly    Mild gynecomastia (bilateral)    Nephrolithiasis    OSA (obstructive sleep apnea)    a.) does not utilize nocturnal PAP therapy   Personal history of surgery to heart and great vessels, presenting hazards to health 05/01/2023   Proteinuria, unspecified 08/01/2023   Pulmonary hypertension (HCC) 09/24/2022   a.) TTE 09/24/2022: RVSP 74.7; b.) TTE 12/23/2022: RVSP 31.3   RBBB (right bundle branch block with left anterior fascicular block)    Right inguinal hernia    Right thyroid  nodule    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   a.) LIMA-LAD, SVG-PDA, SVG-OM3   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/01/2023   Type 2 diabetes mellitus treated with insulin  (HCC)    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   Umbilical hernia    Unstable angina (HCC)    Vitamin D  deficiency      SURGICAL HISTORY   Past Surgical History:  Procedure  Laterality Date   ANOMALOUS PULMONARY VENOUS RETURN REPAIR, TOTAL  2025   APPENDECTOMY     CARDIAC CATHETERIZATION Left 08/16/2016   Procedure: Left Heart Cath and Coronary Angiography;  Surgeon: Cara JONETTA Lovelace, MD;  Location: [facility];  Service: Cardiovascular;  Laterality: Left;   COLONOSCOPY N/A 09/07/2020   Procedure: COLONOSCOPY;  Surgeon: Janalyn Keene NOVAK, MD;  Location: [facility] ENDOSCOPY;  Service: Endoscopy;  Laterality: N/A;   COLONOSCOPY N/A 10/19/2023   Procedure: COLONOSCOPY;  Surgeon: Jinny Carmine, MD;  Location: [facility] ENDOSCOPY;  Service: Endoscopy;  Laterality: N/A;   COLONOSCOPY WITH PROPOFOL  N/A 05/17/2021   Procedure: COLONOSCOPY WITH PROPOFOL ;  Surgeon: Janalyn Keene NOVAK, MD;  Location: [facility] ENDOSCOPY;  Service: Endoscopy;  Laterality: N/A;   CORONARY ANGIOPLASTY WITH STENT PLACEMENT Left 11/26/2016   Procedure: CORONARY ANGIOPLASTY WITH STENT PLACEMENT; Location: [facility]; Surgeon: Alm Dais, MD   CORONARY ARTERY BYPASS GRAFT N/A  08/30/2016   Procedure: CORONARY ARTERY BYPASS GRAFT; Location: [facility]; Surgeon: Juliene Pouch, MD   CORONARY STENT INTERVENTION N/A 06/09/2020   Procedure: CORONARY STENT INTERVENTION;  Surgeon: Florencio Cara BIRCH, MD;  Location: [facility];  Service: Cardiovascular;  Laterality: N/A;   CORONARY STENT INTERVENTION Left 12/17/2016   Procedure: CORONARY STENT INTERVENTION; Location: [facility]; Surgeon: Alm Dais, MD   CORONARY STENT INTERVENTION  03/2024   ENDOSCOPIC MUCOSAL RESECTION N/A 09/22/2020   Procedure: ENDOSCOPIC MUCOSAL RESECTION;  Surgeon: [name] Aloha Raddle., MD;  Location: [facility] ENDOSCOPY;  Service: Gastroenterology;  Laterality: N/A;   ESOPHAGOGASTRODUODENOSCOPY N/A 09/07/2020   Procedure: ESOPHAGOGASTRODUODENOSCOPY (EGD);  Surgeon: Janalyn Keene NOVAK, MD;  Location: [facility] ENDOSCOPY;  Service: Endoscopy;  Laterality: N/A;   ESOPHAGOGASTRODUODENOSCOPY N/A 10/19/2023   Procedure: EGD (ESOPHAGOGASTRODUODENOSCOPY);   Surgeon: Jinny Carmine, MD;  Location: [facility] ENDOSCOPY;  Service: Endoscopy;  Laterality: N/A;   ESOPHAGOGASTRODUODENOSCOPY (EGD) WITH PROPOFOL  N/A 09/22/2020   Procedure: ESOPHAGOGASTRODUODENOSCOPY (EGD) WITH PROPOFOL ;  Surgeon: [name] Aloha Raddle., MD;  Location: [facility] ENDOSCOPY;  Service: Gastroenterology;  Laterality: N/A;   ESOPHAGOGASTRODUODENOSCOPY (EGD) WITH PROPOFOL  N/A 08/26/2023   Procedure: ESOPHAGOGASTRODUODENOSCOPY (EGD) WITH PROPOFOL ;  Surgeon: Jinny Carmine, MD;  Location: [facility] ENDOSCOPY;  Service: Endoscopy;  Laterality: N/A;   FRACTURE SURGERY     HEMOSTASIS CLIP PLACEMENT  09/22/2020   Procedure: HEMOSTASIS CLIP PLACEMENT;  Surgeon: [name] Aloha Raddle., MD;  Location: [facility] ENDOSCOPY;  Service: Gastroenterology;;   HEMOSTASIS CONTROL  09/22/2020   Procedure: HEMOSTASIS CONTROL;  Surgeon: [name] Aloha Raddle., MD;  Location: [facility] ENDOSCOPY;  Service: Gastroenterology;;   INSERTION OF MESH  02/19/2023   Procedure: INSERTION OF MESH;  Surgeon: Desiderio Schanz, MD;  Location: [facility] ORS;  Service: General;;  umbilical   LAPAROSCOPIC PARTIAL RIGHT COLECTOMY Right 08/12/2012   Procedure: LAPAROSCOPIC PARTIAL RIGHT COLECTOMY; Location: [facility]; Surgeon: Unknown Sharps, MD   LEFT HEART CATH AND CORONARY ANGIOGRAPHY Left 06/09/2020   Procedure: LEFT HEART CATH AND CORONARY ANGIOGRAPHY;  Surgeon: Florencio Cara BIRCH, MD;  Location: [facility];  Service: Cardiovascular;  Laterality: Left;   LEFT HEART CATH AND CORS/GRAFTS ANGIOGRAPHY Left 11/20/2016   Procedure: Left Heart Cath and Cors/Grafts Angiography;  Surgeon: Cara BIRCH Florencio, MD;  Location: [facility];  Service: Cardiovascular;  Laterality: Left;   POLYPECTOMY  09/22/2020   Procedure: POLYPECTOMY;  Surgeon: [name] Aloha Raddle., MD;  Location: [facility] ENDOSCOPY;  Service: Gastroenterology;;   POLYPECTOMY  10/19/2023   Procedure: POLYPECTOMY;  Surgeon: Jinny Carmine, MD;  Location: [facility] ENDOSCOPY;  Service: Endoscopy;;    RIGHT HEART CATH Right 07/01/2024   Procedure: RIGHT HEART CATH;  Surgeon: Ammon Blunt, MD;  Location: [facility];  Service: Cardiovascular;  Laterality: Right;   SUBMUCOSAL LIFTING INJECTION  09/22/2020   Procedure: SUBMUCOSAL LIFTING INJECTION;  Surgeon: [name] Aloha Raddle., MD;  Location: [facility] ENDOSCOPY;  Service: Gastroenterology;;   UMBILICAL HERNIA REPAIR N/A 02/19/2023   Procedure: HERNIA REPAIR UMBILICAL ADULT, open;  Surgeon: Desiderio Schanz, MD;  Location: [facility] ORS;  Service: General;  Laterality: N/A;   XI ROBOTIC ASSISTED INGUINAL HERNIA REPAIR WITH MESH Right 02/19/2023   Procedure: XI ROBOTIC ASSISTED INGUINAL HERNIA REPAIR WITH MESH;  Surgeon: Desiderio Schanz, MD;  Location: [facility] ORS;  Service: General;  Laterality: Right;     FAMILY HISTORY   Family History  Problem Relation Age of Onset   Hyperlipidemia Mother    Heart disease Mother    Hypertension Father      SOCIAL HISTORY   Social  History[1]   MEDICATIONS    Home Medication:     Current Medication: Current Medications[2]    ALLERGIES   Patient has no known allergies.     REVIEW OF SYSTEMS    Review of Systems:  Gen:  Denies  fever, sweats, chills weigh loss  HEENT: Denies blurred vision, double vision, ear pain, eye pain, hearing loss, nose bleeds, sore throat Cardiac:  No dizziness, chest pain or heaviness, chest tightness,edema Resp:   reports dyspnea chronically  Gi: Denies swallowing difficulty, stomach pain, nausea or vomiting, diarrhea, constipation, bowel incontinence Gu:  Denies bladder incontinence, burning urine Ext:   Denies Joint pain, stiffness or swelling Skin: Denies  skin rash, easy bruising or bleeding or hives Endoc:  Denies polyuria, polydipsia , polyphagia or weight change Psych:   Denies depression, insomnia or hallucinations   Other:  All other systems negative   VS: BP 124/64 (BP Location: Left Arm)   Pulse 67   Temp 98.1 F (36.7 C)   Resp  18   Ht 6' 0.5 (1.842 m)   Wt 86.1 kg   SpO2 97%   BMI 25.39 kg/m      PHYSICAL EXAM    GENERAL:NAD, no fevers, chills, no weakness no fatigue HEAD: Normocephalic, atraumatic.  EYES: Pupils equal, round, reactive to light. Extraocular muscles intact. No scleral icterus.  MOUTH: Moist mucosal membrane. Dentition intact. No abscess noted.  EAR, NOSE, THROAT: Clear without exudates. No external lesions.  NECK: Supple. No thyromegaly. No nodules. No JVD.  PULMONARY: decreased breath sounds with mild rhonchi worse at bases bilaterally.  CARDIOVASCULAR: S1 and S2. Regular rate and rhythm. No murmurs, rubs, or gallops. No edema. Pedal pulses 2+ bilaterally.  GASTROINTESTINAL: Soft, nontender, nondistended. No masses. Positive bowel sounds. No hepatosplenomegaly.  MUSCULOSKELETAL: No swelling, clubbing, or edema. Range of motion full in all extremities.  NEUROLOGIC: Cranial nerves II through XII are intact. No gross focal neurological deficits. Sensation intact. Reflexes intact.  SKIN: No ulceration, lesions, rashes, or cyanosis. Skin warm and dry. Turgor intact.  PSYCHIATRIC: Mood, affect within normal limits. The patient is awake, alert and oriented x 3. Insight, judgment intact.       IMAGING    Narrative & Impression EXAM: CT CHEST, ABDOMEN AND PELVIS WITHOUT CONTRAST 07/20/2024 06:52:00 PM   TECHNIQUE: CT of the chest, abdomen and pelvis was performed without the administration of intravenous contrast. Multiplanar reformatted images are provided for review. Automated exposure control, iterative reconstruction, and/or weight based adjustment of the mA/kV was utilized to reduce the radiation dose to as low as reasonably achievable.   COMPARISON: 08/20/2023   CLINICAL HISTORY: LDH elevation. Oncology wanted to see if cancerous process. Okay for oral contrast but not intravenous.   FINDINGS:   CHEST: MEDIASTINUM AND LYMPH NODES: Status post transcatheter aortic valve  replacement and coronary artery bypass grafting. Mild cardiomegaly. No pericardial effusion. The central pulmonary arteries are enlarged in keeping with changes of pulmonary arterial hypertension. Mild atherosclerotic calcification within the thoracic aorta. No aortic aneurysm. There is progressive pathologic mediastinal adenopathy. By example, previously measured left prevascular and precarinal lymph nodes now measure 12 mm (previously 10 mm) and 20 mm (previously 10 mm) in short axis diameter. The findings suggest either a chronic inflammatory or low-grade lymphoproliferative process. Stable 2.3 cm right thyroid  nodule, better assessed on thyroid  sonogram of 10/12/2020. No follow up imaging is recommended.   LUNGS AND PLEURA: Small to moderate bilateral pleural effusions, right greater than left, appears similar to  prior examination though there is new pleural thickening bilaterally as well as probable septation noted at the right basilar pleural space which is nonspecific in the setting of repeated thoracentesis. Small focus of pleural gas on the right may relate to recent thoracentesis. No focal consolidation or pulmonary edema. No pneumothorax.   ABDOMEN AND PELVIS: LIVER: The liver is unremarkable.   GALLBLADDER AND BILE DUCTS: Cholelithiasis without superimposed pericholecystic inflammatory change. No intra or extrahepatic biliary ductal dilation.   SPLEEN: No acute abnormality.   PANCREAS: No acute abnormality.   ADRENAL GLANDS: No acute abnormality.   KIDNEYS, URETERS AND BLADDER: Simple exophytic cortical cysts are seen within the kidneys bilaterally for which no follow up imaging is recommended. Of a hyperdense exophytic cortical cyst arises anteriorly but little lower pole of the right kidney but is unchanged from prior examination and was better characterized as a simple cyst on that exam. Per consensus, no follow-up is needed for simple Bosniak type 1 and 2  renal cysts, unless the patient has a malignancy history or risk factors. 7 mm nonobstructing calculus within the lower pole of the left kidney. The kidneys are otherwise unremarkable. No stones in the ureters. No hydronephrosis. No perinephric or periureteral stranding. Urinary bladder is unremarkable.   GI AND BOWEL: Moderate stool burden within the distal colon and rectum without evidence of obstruction. The stomach, small bowel, and large bowel are otherwise unremarkable. Appendix absent.   REPRODUCTIVE ORGANS: No acute abnormality.   PERITONEUM AND RETROPERITONEUM: No ascites. No free air.   VASCULATURE: Aorta is normal in caliber. Extensive aortic iliac atherosclerotic calcification.   ABDOMINAL AND PELVIS LYMPH NODES: Pathologic aortic cable adenopathy with a single lymph node measuring 15 mm in short axis diameter is stable since prior examination, nonspecific. No additional pathologic adenopathy within the abdomen and pelvis.   REPRODUCTIVE ORGANS: No acute abnormality.   BONES AND SOFT TISSUES: Osseous structures are age appropriate. No acute bone abnormality. No lytic or blastic bone lesion. No focal soft tissue abnormality.   IMPRESSION: 1. Progressive pathologic mediastinal adenopathy, suggestive of a chronic inflammatory or low-grade lymphoproliferative process. 2. Small to moderate bilateral pleural effusions, right greater than left, with new bilateral pleural thickening and probable right basilar pleural space septation; small right pleural gas likely post-thoracentesis. 3. Stable pathologic aortocaval adenopathy measuring 15 mm in short axis, nonspecific. 4. Stable 2.3 cm right thyroid  nodule; no follow-up imaging is recommended. 5. Cholelithiasis without acute inflammatory change and without biliary ductal dilation. 6. Moderate stool burden in the distal colon and rectum without obstruction. 7. RAF score: Aortic Atherosclerosis (ICD10-I70.0).    Electronically signed by: Dorethia Molt MD 07/20/2024 08:42 PM EST RP Workstation: [id number]   ASSESSMENT/PLAN   Acute hypoxemic respiratory failure    - due to Recurrent pleural effusions with compressive atelectasis    - acute on chronic heart failure    - Chronic renal failure    -possible lymphoproliferative process with mediastinal and aortocaval adenopathy   Acute on chronic CHF    - cardiology on case - appreciate input - Dr Wilburn and Junette RIGGERS   -Per cardiology continue with BID diuresis , Continue home jardiance  10 mg daily, carvedilol  3.125 mg twice daily. Consider addition of MRA pending renal function.  -Continue aspirin  81 mg daily and atorvastatin  80 mg daily.  -Continue amlodipine  10 mg daily and imdur  120 mg daily.  -hx of TAVF and AF Ref Range & Units (hover) 3 wk ago 1 mo ago  Pro Brain  Natriuretic Peptide 13,853.0 High  12,167.0 High  CM     Bilateral pleural effusions - appreciate IR - s/p Throacentesis - Left side 700cc -pleural fluid studies- previous 07/20/24- negative culture/gram stain, negative cytology and flow cytometry, lymphocyte predominant. Elevated LDH (however patient on diuretics)  CKD 3B   - dc nonessential nephrotoxic medications  - contributing to fluid shift and hypoxemia   Rounded and compressiveatelectasis   - patient would benefit from chest physiotherapy   - metaneb with albuterol TID with RT  - PT/OT  - incentive spirometry    Pulmonary hypertension    Severe per RHC - 06/2024   Hemodynamic findings consistent with severe pulmonary hypertension.   1.         RA 15/15 mmHg            RV 87/21            PA 86/42  (51)            PCWP 45/24  (32)  -most likely due to Group 2 - cardiac related - Patient with severe MR -Currently NYHA 3-4 -agree with diiuresis -supplemental O2 -optimization of underlying cardiac dysfunction -will consider additional PH medication after patient is more optimized from respiratory  perspective    Thank you for allowing me to participate in the care of this patient.   Patient/Family are satisfied with care plan and all questions have been answered.    Provider disclosure: Patient with at least one acute or chronic illness or injury that poses a threat to life or bodily function and is being managed actively during this encounter.  All of the below services have been performed independently by signing provider:  review of prior documentation from internal and or external health records.  Review of previous and current lab results.  Interview and comprehensive assessment during patient visit today. Review of current and previous chest radiographs/CT scans. Discussion of management and test interpretation with health care team and patient/family.   This document was prepared using Dragon voice recognition software and may include unintentional dictation errors.     Tavie Haseman, M.D.  Division of Pulmonary & Critical Care Medicine              [1]  Social History Tobacco Use   Smoking status: Former    Current packs/day: 0.00    Types: Cigarettes    Quit date: 2000    Years since quitting: 26.0    Passive exposure: Never   Smokeless tobacco: Never  Vaping Use   Vaping status: Never Used  Substance Use Topics   Alcohol use: No   Drug use: No  [2]  Current Facility-Administered Medications:    0.9 %  sodium chloride  infusion (Manually program via Guardrails IV Fluids), , Intravenous, Once, Jens Durand, MD   acetaminophen  (TYLENOL ) tablet 650 mg, 650 mg, Oral, Q6H PRN **OR** acetaminophen  (TYLENOL ) suppository 650 mg, 650 mg, Rectal, Q6H PRN, [place] Isaiah CROME, NP   amLODipine  (NORVASC ) tablet 10 mg, 10 mg, Oral, Daily, [place] Isaiah L, NP, 10 mg at 08/12/24 0916   aspirin  EC tablet 81 mg, 81 mg, Oral, Daily, Alto Isaiah CROME, NP, 81 mg at 08/12/24 0916   atorvastatin  (LIPITOR ) tablet 80 mg, 80 mg, Oral, Daily, Alto Isaiah CROME, NP, 80 mg at  08/12/24 0916   carvedilol  (COREG ) tablet 3.125 mg, 3.125 mg, Oral, BID WC, Alto Isaiah CROME, NP, 3.125 mg at 08/12/24 0916   cyanocobalamin  (VITAMIN B12) tablet 1,000 mcg, 1,000  mcg, Oral, Daily, Alto Isaiah CROME, NP, 1,000 mcg at 08/12/24 9083   empagliflozin  (JARDIANCE ) tablet 10 mg, 10 mg, Oral, QAC breakfast, Alto Isaiah CROME, NP, 10 mg at 08/12/24 9078   ferrous sulfate  tablet 325 mg, 325 mg, Oral, Daily, Alto Isaiah CROME, NP, 325 mg at 08/12/24 9083   furosemide  (LASIX ) injection 60 mg, 60 mg, Intravenous, Q12H, Alto Isaiah CROME, NP, 60 mg at 08/12/24 9078   heparin  injection 5,000 Units, 5,000 Units, Subcutaneous, Q8H, Alto Isaiah CROME, NP, 5,000 Units at 08/12/24 9365   isosorbide  mononitrate (IMDUR ) 24 hr tablet 120 mg, 120 mg, Oral, Daily, Alto Isaiah CROME, NP, 120 mg at 08/12/24 9083   ondansetron  (ZOFRAN ) tablet 4 mg, 4 mg, Oral, Q6H PRN **OR** ondansetron  (ZOFRAN ) injection 4 mg, 4 mg, Intravenous, Q6H PRN, Alto Isaiah CROME, NP   pantoprazole  (PROTONIX ) EC tablet 40 mg, 40 mg, Oral, Daily, Alto Isaiah CROME, NP, 40 mg at 08/12/24 0916   sodium chloride  flush (NS) 0.9 % injection 3 mL, 3 mL, Intravenous, Q12H, Alto Isaiah CROME, NP, 3 mL at 08/12/24 9077  "

## 2024-08-13 DIAGNOSIS — I5031 Acute diastolic (congestive) heart failure: Secondary | ICD-10-CM | POA: Diagnosis not present

## 2024-08-13 LAB — CBC
HCT: 22.5 % — ABNORMAL LOW (ref 39.0–52.0)
Hemoglobin: 7.1 g/dL — ABNORMAL LOW (ref 13.0–17.0)
MCH: 32.1 pg (ref 26.0–34.0)
MCHC: 31.6 g/dL (ref 30.0–36.0)
MCV: 101.8 fL — ABNORMAL HIGH (ref 80.0–100.0)
Platelets: 94 K/uL — ABNORMAL LOW (ref 150–400)
RBC: 2.21 MIL/uL — ABNORMAL LOW (ref 4.22–5.81)
RDW: 19.1 % — ABNORMAL HIGH (ref 11.5–15.5)
WBC: 5.9 K/uL (ref 4.0–10.5)
nRBC: 0 % (ref 0.0–0.2)

## 2024-08-13 LAB — BASIC METABOLIC PANEL WITH GFR
Anion gap: 7 (ref 5–15)
BUN: 43 mg/dL — ABNORMAL HIGH (ref 8–23)
CO2: 31 mmol/L (ref 22–32)
Calcium: 10.2 mg/dL (ref 8.9–10.3)
Chloride: 99 mmol/L (ref 98–111)
Creatinine, Ser: 1.85 mg/dL — ABNORMAL HIGH (ref 0.61–1.24)
GFR, Estimated: 37 mL/min — ABNORMAL LOW
Glucose, Bld: 155 mg/dL — ABNORMAL HIGH (ref 70–99)
Potassium: 4.3 mmol/L (ref 3.5–5.1)
Sodium: 137 mmol/L (ref 135–145)

## 2024-08-13 LAB — MAGNESIUM: Magnesium: 2.2 mg/dL (ref 1.7–2.4)

## 2024-08-13 LAB — HEMOGLOBIN AND HEMATOCRIT, BLOOD
HCT: 24.6 % — ABNORMAL LOW (ref 39.0–52.0)
Hemoglobin: 8 g/dL — ABNORMAL LOW (ref 13.0–17.0)

## 2024-08-13 LAB — PREPARE RBC (CROSSMATCH)

## 2024-08-13 MED ORDER — SODIUM CHLORIDE 0.9% IV SOLUTION: Freq: Once | INTRAVENOUS | Status: AC

## 2024-08-13 NOTE — Progress Notes (Signed)
 [facility]  CARDIOLOGY PROGRESS NOTE  Patient ID: [name] MRN: [id number] DOB/AGE: [birth year] 79 y.o.  Admit date: 08/10/2024 Referring Physician Dr. Jens Reason for Consultation Heart Failure  HPI: Detail that is breathing is little worse today.  Pedal edema improved  Review of systems complete and found to be negative unless listed above     Past Medical History:  Diagnosis Date   (HFpEF) heart failure with preserved ejection fraction (HCC) 07/25/2016   a.)TTE 07/25/16: EF >55, triv-mild pan regur, mild AS, G2DD; b.)TTE 09/02/16: EF >55, triv TR, mild AS; c.)TTE 11/05/16: EF >55, LAE, triv-mild pan regur, mild AS, G2DD; d.)TTE 01/21/20: EF >55, mod LVH, LAE/RVE, triv-mild AR/PR/TR, mod MR, mild AS, G2DD; e.)TTE 09/24/22: EF>55, sev LVH, sev LAE, RAE, mild PR, mod AR/MR/TR, mild-mod AS, RVSP 74.7; f.)TTE 12/23/22: EF 60-65, LAE, mild-mod MR/TR, mild AS   Abnormal urine 05/01/2023   Anemia    Aortic atherosclerosis    Aortic stenosis 07/25/2016   a.) TTE 07/25/2016: mild (MPG 13.5); b.) TTE 09/02/2016: mild (MPG 16); c.) TTE 11/05/2016: mild (MPG 9); d.) TTE 01/21/2020: mild (MPG 11.7); e.) TTE 09/24/2022: mild-mod (MPG 20.3); f.) TTE 12/23/2022: mild (MPG 15)   Atrial fibrillation (HCC)    a.) CHA2DS2VASc = 6 (age x2, CHF, HTN, vascular disease history, T2DM);  b.) rate/rhythm maintained on oral labetalol ; chronically anticoagulated with apixaban  + clopidogrel    AV block, 1st degree    B12 deficiency    CKD (chronic kidney disease), stage III (HCC)    Colon polyps    Congestive heart failure (HCC) 05/01/2023   Coronary artery disease 08/16/2016   a.) s/p 3v CABG 08/30/2016; b.) s/p PCI 11/26/2016 (DES x 1 oSVG-RCA); c.) s/p PCI 12/17/2016 (DES x 5 --> mLCx x2, dLCx, pLAD, dLAD); c.) s/p PCI 06/09/2020 (DES x1 --> ISR oSVG-PDA)   Cyst of kidney, acquired 07/03/2023   Dyspnea    Gastroesophageal reflux disease 05/01/2023   GERD (gastroesophageal reflux disease)    Heart  murmur    Hepatic steatosis    History of 2019 novel coronavirus disease (COVID-19) 09/02/2020   a.) Tx'd with remdesivir    History of blood transfusion    History of GI bleed 09/02/2020   a.) admitted to [facility] 09/02/2020 - 09/08/2020   History of heart artery stent 11/26/2016   TOTAL of 7 stents: a.) 11/26/2016 --> 3.5 x 22 mm Resolute Integrity oSVG-RCA; b.) 12/17/2016: 3.0 x 12 mm Xience Alpine dLCx, 3.5 x 22 mm Resolute Onyx mLCx, 3.5 x 18 mm Resolute Onyx mLCx, 3.0 x 26 mm Resolute Onyx pLAD, 2.0 x 26 mm Resolute Onyx dLAD; c.) 06/09/2020 --> 2.5 x 18 mm Resolute Onyx ISR oSVG-PDA   History of partial colectomy 08/12/2012   a.) large gastric hyperplastic polyp removal   HLD (hyperlipidemia)    Hypertension    LBBB (left bundle branch block)    Long term current use of anticoagulant    a.) apixaban    Long term current use of antithrombotics/antiplatelets    a.) clopidogrel    Male hypogonadism    a.) on exogenous TRT (1.62% gel)   Mild cardiomegaly    Mild gynecomastia (bilateral)    Nephrolithiasis    OSA (obstructive sleep apnea)    a.) does not utilize nocturnal PAP therapy   Personal history of surgery to heart and great vessels, presenting hazards to health 05/01/2023   Proteinuria, unspecified 08/01/2023   Pulmonary hypertension (HCC) 09/24/2022   a.) TTE 09/24/2022: RVSP 74.7; b.) TTE  12/23/2022: RVSP 31.3   RBBB (right bundle branch block with left anterior fascicular block)    Right inguinal hernia    Right thyroid  nodule    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   a.) LIMA-LAD, SVG-PDA, SVG-OM3   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/01/2023   Type 2 diabetes mellitus treated with insulin  (HCC)    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   Umbilical hernia    Unstable angina (HCC)    Vitamin D  deficiency     Past Surgical History:  Procedure Laterality Date   ANOMALOUS PULMONARY VENOUS RETURN REPAIR, TOTAL  2025   APPENDECTOMY      CARDIAC CATHETERIZATION Left 08/16/2016   Procedure: Left Heart Cath and Coronary Angiography;  Surgeon: Cara JONETTA Lovelace, MD;  Location: [facility]ASIVE CV LAB;  Service: Cardiovascular;  Laterality: Left;   COLONOSCOPY N/A 09/07/2020   Procedure: COLONOSCOPY;  Surgeon: Janalyn Keene NOVAK, MD;  Location: [facility] ENDOSCOPY;  Service: Endoscopy;  Laterality: N/A;   COLONOSCOPY N/A 10/19/2023   Procedure: COLONOSCOPY;  Surgeon: Jinny Carmine, MD;  Location: [facility] ENDOSCOPY;  Service: Endoscopy;  Laterality: N/A;   COLONOSCOPY WITH PROPOFOL  N/A 05/17/2021   Procedure: COLONOSCOPY WITH PROPOFOL ;  Surgeon: Janalyn Keene NOVAK, MD;  Location: [facility] ENDOSCOPY;  Service: Endoscopy;  Laterality: N/A;   CORONARY ANGIOPLASTY WITH STENT PLACEMENT Left 11/26/2016   Procedure: CORONARY ANGIOPLASTY WITH STENT PLACEMENT; Location: [facility]; Surgeon: Alm Dais, MD   CORONARY ARTERY BYPASS GRAFT N/A 08/30/2016   Procedure: CORONARY ARTERY BYPASS GRAFT; Location: [facility]; Surgeon: Juliene Pouch, MD   CORONARY STENT INTERVENTION N/A 06/09/2020   Procedure: CORONARY STENT INTERVENTION;  Surgeon: Lovelace Cara JONETTA, MD;  Location: [facility];  Service: Cardiovascular;  Laterality: N/A;   CORONARY STENT INTERVENTION Left 12/17/2016   Procedure: CORONARY STENT INTERVENTION; Location: [facility]; Surgeon: Alm Dais, MD   CORONARY STENT INTERVENTION  03/2024   ENDOSCOPIC MUCOSAL RESECTION N/A 09/22/2020   Procedure: ENDOSCOPIC MUCOSAL RESECTION;  Surgeon: [name] Aloha Raddle., MD;  Location: [facility] ENDOSCOPY;  Service: Gastroenterology;  Laterality: N/A;   ESOPHAGOGASTRODUODENOSCOPY N/A 09/07/2020   Procedure: ESOPHAGOGASTRODUODENOSCOPY (EGD);  Surgeon: Janalyn Keene NOVAK, MD;  Location: [facility] ENDOSCOPY;  Service: Endoscopy;  Laterality: N/A;   ESOPHAGOGASTRODUODENOSCOPY N/A 10/19/2023   Procedure: EGD (ESOPHAGOGASTRODUODENOSCOPY);  Surgeon: Jinny Carmine, MD;  Location: [facility] ENDOSCOPY;  Service: Endoscopy;  Laterality: N/A;    ESOPHAGOGASTRODUODENOSCOPY (EGD) WITH PROPOFOL  N/A 09/22/2020   Procedure: ESOPHAGOGASTRODUODENOSCOPY (EGD) WITH PROPOFOL ;  Surgeon: [name] Aloha Raddle., MD;  Location: [facility] ENDOSCOPY;  Service: Gastroenterology;  Laterality: N/A;   ESOPHAGOGASTRODUODENOSCOPY (EGD) WITH PROPOFOL  N/A 08/26/2023   Procedure: ESOPHAGOGASTRODUODENOSCOPY (EGD) WITH PROPOFOL ;  Surgeon: Jinny Carmine, MD;  Location: [facility] ENDOSCOPY;  Service: Endoscopy;  Laterality: N/A;   FRACTURE SURGERY     HEMOSTASIS CLIP PLACEMENT  09/22/2020   Procedure: HEMOSTASIS CLIP PLACEMENT;  Surgeon: [name] Aloha Raddle., MD;  Location: [facility] ENDOSCOPY;  Service: Gastroenterology;;   HEMOSTASIS CONTROL  09/22/2020   Procedure: HEMOSTASIS CONTROL;  Surgeon: [name] Aloha Raddle., MD;  Location: [facility] ENDOSCOPY;  Service: Gastroenterology;;   INSERTION OF MESH  02/19/2023   Procedure: INSERTION OF MESH;  Surgeon: Desiderio Schanz, MD;  Location: [facility] ORS;  Service: General;;  umbilical   LAPAROSCOPIC PARTIAL RIGHT COLECTOMY Right 08/12/2012   Procedure: LAPAROSCOPIC PARTIAL RIGHT COLECTOMY; Location: [facility]; Surgeon: Unknown Sharps, MD   LEFT HEART CATH AND CORONARY ANGIOGRAPHY Left 06/09/2020   Procedure: LEFT HEART CATH AND CORONARY ANGIOGRAPHY;  Surgeon: Lovelace Cara JONETTA, MD;  Location: [facility];  Service: Cardiovascular;  Laterality: Left;   LEFT HEART CATH AND CORS/GRAFTS ANGIOGRAPHY Left 11/20/2016   Procedure: Left Heart Cath and Cors/Grafts Angiography;  Surgeon: Cara JONETTA Lovelace, MD;  Location: [facility];  Service: Cardiovascular;  Laterality: Left;   POLYPECTOMY  09/22/2020   Procedure: POLYPECTOMY;  Surgeon: [name] Aloha Raddle., MD;  Location: [facility] ENDOSCOPY;  Service: Gastroenterology;;   POLYPECTOMY  10/19/2023   Procedure: POLYPECTOMY;  Surgeon: Jinny Carmine, MD;  Location: [facility] ENDOSCOPY;  Service: Endoscopy;;   RIGHT HEART CATH Right 07/01/2024   Procedure: RIGHT HEART CATH;  Surgeon: Ammon Blunt, MD;  Location: [facility];  Service: Cardiovascular;  Laterality: Right;   SUBMUCOSAL LIFTING INJECTION  09/22/2020   Procedure: SUBMUCOSAL LIFTING INJECTION;  Surgeon: [name] Aloha Raddle., MD;  Location: [facility] ENDOSCOPY;  Service: Gastroenterology;;   UMBILICAL HERNIA REPAIR N/A 02/19/2023   Procedure: HERNIA REPAIR UMBILICAL ADULT, open;  Surgeon: Desiderio Schanz, MD;  Location: [facility] ORS;  Service: General;  Laterality: N/A;   XI ROBOTIC ASSISTED INGUINAL HERNIA REPAIR WITH MESH Right 02/19/2023   Procedure: XI ROBOTIC ASSISTED INGUINAL HERNIA REPAIR WITH MESH;  Surgeon: Desiderio Schanz, MD;  Location: [facility] ORS;  Service: General;  Laterality: Right;    Medications Prior to Admission  Medication Sig Dispense Refill Last Dose/Taking   amLODipine  (NORVASC ) 10 MG tablet Take 10 mg by mouth daily.   08/09/2024   aspirin  EC 81 MG tablet Take 81 mg by mouth daily.   08/09/2024   atorvastatin  (LIPITOR ) 80 MG tablet Take 80 mg by mouth.  Take 80 mg by mouth in the morning.   08/09/2024   bisacodyl  (DULCOLAX) 5 MG EC tablet Take 5 mg by mouth as needed for moderate constipation.   Unknown   carvedilol  (COREG ) 3.125 MG tablet Take 1 tablet (3.125 mg total) by mouth 2 (two) times daily with a meal. 60 tablet 1 08/09/2024   cyanocobalamin  (VITAMIN B12) 1000 MCG tablet Take 1,000 mcg by mouth daily.   08/09/2024   dexlansoprazole (DEXILANT) 60 MG capsule Take 1 capsule by mouth daily.   08/09/2024   empagliflozin  (JARDIANCE ) 10 MG TABS tablet Take 1 tablet (10 mg total) by mouth daily before breakfast. 30 tablet 0 08/09/2024   Ferrous Sulfate  (IRON ) 325 (65 Fe) MG TABS Take 1 tablet by mouth daily.   08/09/2024   isosorbide  mononitrate (IMDUR ) 120 MG 24 hr tablet Take 120 mg by mouth daily.   08/09/2024   lactulose  (CHRONULAC ) 10 GM/15ML solution Taking 15-20 cc by mouth 3 to 4 times a day for constiption (Patient taking differently: Taking 15-20 cc by mouth 3 to 4 times a day for  constiption as needed) 946 mL 2 08/09/2024   nitroGLYCERIN  (NITROSTAT ) 0.4 MG SL tablet Place 0.4 mg under the tongue every 5 (five) minutes as needed for chest pain.   Unknown   torsemide  (DEMADEX ) 20 MG tablet Take 2 tablets (40 mg total) by mouth daily. 60 tablet 0 Unknown   clopidogrel  (PLAVIX ) 75 MG tablet Take 75 mg by mouth daily. (Patient not taking: Reported on 08/10/2024)   Not Taking   Social History   Socioeconomic History   Marital status: Married    Spouse name: Not on file   Number of children: Not on file   Years of education: Not on file   Highest education level: Not on file  Occupational History   Not on file  Tobacco Use   Smoking status: Former  Current packs/day: 0.00    Types: Cigarettes    Quit date: 2000    Years since quitting: 26.0    Passive exposure: Never   Smokeless tobacco: Never  Vaping Use   Vaping status: Never Used  Substance and Sexual Activity   Alcohol use: No   Drug use: No   Sexual activity: Not Currently  Other Topics Concern   Not on file  Social History Narrative   Not on file   Social Drivers of Health   Tobacco Use: Medium Risk (08/10/2024)   Patient History    Smoking Tobacco Use: Former    Smokeless Tobacco Use: Never    Passive Exposure: Never  Physicist, Medical Strain: Low Risk  (05/08/2024)   Received from [facility]   Overall Financial Resource Strain (CARDIA)    Difficulty of Paying Living Expenses: Not hard at all  Food Insecurity: No Food Insecurity (08/11/2024)   Epic    Worried About Radiation Protection Practitioner of Food in the Last Year: Never true    Ran Out of Food in the Last Year: Never true  Transportation Needs: No Transportation Needs (08/11/2024)   Epic    Lack of Transportation (Medical): No    Lack of Transportation (Non-Medical): No  Physical Activity: Not on file  Stress: Not on file  Social Connections: Unknown (08/11/2024)   Social Connection and Isolation Panel    Frequency of  Communication with Friends and Family: More than three times a week    Frequency of Social Gatherings with Friends and Family: More than three times a week    Attends Religious Services: Not on Marketing Executive or Organizations: Not on file    Attends Banker Meetings: Not on file    Marital Status: Not on file  Recent Concern: Social Connections - Moderately Isolated (07/18/2024)   Social Connection and Isolation Panel    Frequency of Communication with Friends and Family: More than three times a week    Frequency of Social Gatherings with Friends and Family: More than three times a week    Attends Religious Services: Never    Database Administrator or Organizations: No    Attends Banker Meetings: Never    Marital Status: Married  Catering Manager Violence: Unknown (08/11/2024)   Epic    Fear of Current or Ex-Partner: No    Emotionally Abused: No    Physically Abused: Not on file    Sexually Abused: Not on file  Depression (PHQ2-9): Low Risk (07/16/2024)   Depression (PHQ2-9)    PHQ-2 Score: 0  Alcohol Screen: Not on file  Housing: Unknown (08/11/2024)   Epic    Unable to Pay for Housing in the Last Year: No    Number of Times Moved in the Last Year: 0    Homeless in the Last Year: Not on file  Utilities: Not At Risk (08/11/2024)   Epic    Threatened with loss of utilities: No  Health Literacy: Not on file    Family History  Problem Relation Age of Onset   Hyperlipidemia Mother    Heart disease Mother    Hypertension Father       Review of systems complete and found to be negative unless listed above      PHYSICAL EXAM  Alert and oriented x 3 +1 pedal edema Grade 3 systolic murmur aortic area Decreased basilar breath  Labs:   Lab Results  Component  Value Date   WBC 5.9 08/13/2024   HGB 7.1 (L) 08/13/2024   HCT 22.5 (L) 08/13/2024   MCV 101.8 (H) 08/13/2024   PLT 94 (L) 08/13/2024    Recent Labs  Lab  08/10/24 0732 08/11/24 0521 08/13/24 0359  NA 141   < > 137  K 4.7   < > 4.3  CL 102   < > 99  CO2 28   < > 31  BUN 37*   < > 43*  CREATININE 1.67*   < > 1.85*  CALCIUM  11.1*   < > 10.2  PROT 8.1  --   --   BILITOT 1.9*  --   --   ALKPHOS 129*  --   --   ALT 12  --   --   AST 45*  --   --   GLUCOSE 168*   < > 155*   < > = values in this interval not displayed.   No results found for: CKTOTAL, CKMB, CKMBINDEX, TROPONINI  Lab Results  Component Value Date   CHOL 85 12/21/2022   Lab Results  Component Value Date   HDL 45 12/21/2022   Lab Results  Component Value Date   LDLCALC 29 12/21/2022   Lab Results  Component Value Date   TRIG 53 12/21/2022   Lab Results  Component Value Date   CHOLHDL 1.9 12/21/2022   No results found for: LDLDIRECT    Radiology: [facility] Chest Port 1 View Result Date: 08/10/2024 EXAM: 1 VIEW(S) XRAY OF THE CHEST 08/10/2024 04:14:18 PM COMPARISON: 08/10/2024 CLINICAL HISTORY: S/P thoracentesis FINDINGS: LINES, TUBES AND DEVICES: TAVR in place. CABG markers noted. LUNGS AND PLEURA: Mild pulmonary edema, unchanged. Stable small to moderate bilateral pleural effusions. Unchanged bibasilar opacities. No pneumothorax. HEART AND MEDIASTINUM: Stable cardiomegaly. Aortic atherosclerosis. BONES AND SOFT TISSUES: Median sternotomy wires noted. No acute osseous abnormality. IMPRESSION: 1. Mild pulmonary edema, unchanged. 2. Stable small to moderate bilateral pleural effusions. Electronically signed by: Greig Pique MD 08/10/2024 06:13 PM EST RP Workstation: [id number]   US  THORACENTESIS ASP PLEURAL SPACE W/IMG GUIDE Result Date: 08/10/2024 INDICATION: 79 year old male with an extensive cardiac history who presented to the ED today with worsening exertional chest pain and dyspnea on exertion. Workup consistent with heart failure exacerbation and imaging concerning for moderate bilateral pleural effusions with mild pulmonary edema and bibasilar atelectasis.  Request for diagnostic and therapeutic thoracentesis. EXAM: ULTRASOUND GUIDED DIAGNOSTIC AND THERAPEUTIC, LEFT-SIDED THORACENTESIS MEDICATIONS: 1% lidocaine , 6 mL. COMPLICATIONS: None immediate. PROCEDURE: An ultrasound guided thoracentesis was thoroughly discussed with the patient and questions answered. The benefits, risks, alternatives and complications were also discussed. The patient understands and wishes to proceed with the procedure. Written consent was obtained. Ultrasound was performed to localize and mark an adequate pocket of fluid in the left chest. The area was then prepped and draped in the normal sterile fashion. 1% Lidocaine  was used for local anesthesia. Under ultrasound guidance a 6 Fr Safe-T-Centesis catheter was introduced. Thoracentesis was performed. The catheter was removed and a dressing applied. FINDINGS: A total of approximately 700 mL of serous sanguinous fluid was removed. Samples were sent to the laboratory as requested by the clinical team. IMPRESSION: Successful ultrasound guided left-sided thoracentesis yielding 700 mL of pleural fluid. Procedure performed by: Sherrilee Bal, PA-C under the supervision of Dr. VEAR Lent Electronically Signed   By: Wilkie Lent M.D.   On: 08/10/2024 16:38   DG Chest 2 View Result Date: 08/10/2024 EXAM: 2 VIEW(S) XRAY OF  THE CHEST 08/10/2024 08:36:00 AM COMPARISON: 08/01/2024 CLINICAL HISTORY: dyspnea, cp FINDINGS: LUNGS AND PLEURA: Moderate bilateral pleural effusions. Mild pulmonary edema. Bibasilar atelectasis/airspace disease. No pneumothorax. HEART AND MEDIASTINUM: Stable CABG markers. TAVR noted. Aortic atherosclerosis. BONES AND SOFT TISSUES: Sternotomy wires noted. No acute osseous abnormality. IMPRESSION: 1. Moderate bilateral pleural effusions with mild pulmonary edema and bibasilar atelectasis/airspace disease. Electronically signed by: Waddell Calk MD 08/10/2024 08:41 AM EST RP Workstation: [id number]   DG Chest 1  View Result Date: 08/01/2024 EXAM: 1 VIEW(S) XRAY OF THE CHEST 08/01/2024 09:32:09 AM COMPARISON: 07/20/2024 CLINICAL HISTORY: SOB (shortness of breath) FINDINGS: LUNGS AND PLEURA: Bibasilar airspace opacities are stable. Mild pulmonary edema is present. Moderate right pleural effusion, slightly decreased. Similar small left pleural effusion. No pneumothorax. HEART AND MEDIASTINUM: Stable cardiomegaly. Aortic atherosclerotic calcification. Aortic valve replacement is noted. BONES AND SOFT TISSUES: Median sternotomy is noted. No acute osseous abnormality. IMPRESSION: 1. Moderate right pleural effusion, slightly decreased, and similar small left pleural effusion. 2. Stable bibasilar airspace opacities and mild pulmonary edema. 3. Stable cardiomegaly and aortic atherosclerotic calcification. 4. Postsurgical changes of median sternotomy and aortic valve replacement. Electronically signed by: Evalene Coho MD 08/01/2024 09:37 AM EST RP Workstation: [id number]   CT CHEST ABDOMEN PELVIS WO CONTRAST Result Date: 07/20/2024 EXAM: CT CHEST, ABDOMEN AND PELVIS WITHOUT CONTRAST 07/20/2024 06:52:00 PM TECHNIQUE: CT of the chest, abdomen and pelvis was performed without the administration of intravenous contrast. Multiplanar reformatted images are provided for review. Automated exposure control, iterative reconstruction, and/or weight based adjustment of the mA/kV was utilized to reduce the radiation dose to as low as reasonably achievable. COMPARISON: 08/20/2023 CLINICAL HISTORY: LDH elevation. Oncology wanted to see if cancerous process. Okay for oral contrast but not intravenous. FINDINGS: CHEST: MEDIASTINUM AND LYMPH NODES: Status post transcatheter aortic valve replacement and coronary artery bypass grafting. Mild cardiomegaly. No pericardial effusion. The central pulmonary arteries are enlarged in keeping with changes of pulmonary arterial hypertension. Mild atherosclerotic calcification within the thoracic aorta.  No aortic aneurysm. There is progressive pathologic mediastinal adenopathy. By example, previously measured left prevascular and precarinal lymph nodes now measure 12 mm (previously 10 mm) and 20 mm (previously 10 mm) in short axis diameter. The findings suggest either a chronic inflammatory or low-grade lymphoproliferative process. Stable 2.3 cm right thyroid  nodule, better assessed on thyroid  sonogram of 10/12/2020. No follow up imaging is recommended. LUNGS AND PLEURA: Small to moderate bilateral pleural effusions, right greater than left, appears similar to prior examination though there is new pleural thickening bilaterally as well as probable septation noted at the right basilar pleural space which is nonspecific in the setting of repeated thoracentesis. Small focus of pleural gas on the right may relate to recent thoracentesis. No focal consolidation or pulmonary edema. No pneumothorax. ABDOMEN AND PELVIS: LIVER: The liver is unremarkable. GALLBLADDER AND BILE DUCTS: Cholelithiasis without superimposed pericholecystic inflammatory change. No intra or extrahepatic biliary ductal dilation. SPLEEN: No acute abnormality. PANCREAS: No acute abnormality. ADRENAL GLANDS: No acute abnormality. KIDNEYS, URETERS AND BLADDER: Simple exophytic cortical cysts are seen within the kidneys bilaterally for which no follow up imaging is recommended. Of a hyperdense exophytic cortical cyst arises anteriorly but little lower pole of the right kidney but is unchanged from prior examination and was better characterized as a simple cyst on that exam. Per consensus, no follow-up is needed for simple Bosniak type 1 and 2 renal cysts, unless the patient has a malignancy history or risk factors. 7 mm nonobstructing calculus within the lower  pole of the left kidney. The kidneys are otherwise unremarkable. No stones in the ureters. No hydronephrosis. No perinephric or periureteral stranding. Urinary bladder is unremarkable. GI AND BOWEL:  Moderate stool burden within the distal colon and rectum without evidence of obstruction. The stomach, small bowel, and large bowel are otherwise unremarkable. Appendix absent. REPRODUCTIVE ORGANS: No acute abnormality. PERITONEUM AND RETROPERITONEUM: No ascites. No free air. VASCULATURE: Aorta is normal in caliber. Extensive aortic iliac atherosclerotic calcification. ABDOMINAL AND PELVIS LYMPH NODES: Pathologic aortic cable adenopathy with a single lymph node measuring 15 mm in short axis diameter is stable since prior examination, nonspecific. No additional pathologic adenopathy within the abdomen and pelvis. REPRODUCTIVE ORGANS: No acute abnormality. BONES AND SOFT TISSUES: Osseous structures are age appropriate. No acute bone abnormality. No lytic or blastic bone lesion. No focal soft tissue abnormality. IMPRESSION: 1. Progressive pathologic mediastinal adenopathy, suggestive of a chronic inflammatory or low-grade lymphoproliferative process. 2. Small to moderate bilateral pleural effusions, right greater than left, with new bilateral pleural thickening and probable right basilar pleural space septation; small right pleural gas likely post-thoracentesis. 3. Stable pathologic aortocaval adenopathy measuring 15 mm in short axis, nonspecific. 4. Stable 2.3 cm right thyroid  nodule; no follow-up imaging is recommended. 5. Cholelithiasis without acute inflammatory change and without biliary ductal dilation. 6. Moderate stool burden in the distal colon and rectum without obstruction. 7. RAF score: Aortic Atherosclerosis (ICD10-I70.0). Electronically signed by: Dorethia Molt MD 07/20/2024 08:42 PM EST RP Workstation: [id number]   DG Chest Port 1 View Result Date: 07/20/2024 CLINICAL DATA:  Status post right thoracentesis. EXAM: PORTABLE CHEST 1 VIEW COMPARISON:  07/18/2024 FINDINGS: Small to moderate-sized right pleural effusion, decreased in size. No pneumothorax. No significant change in right basilar atelectasis  and possible pneumonia. Stable small left pleural effusion. Increased linear and patchy density at the left lung base. Atheromatous aortic arch calcifications. Diffuse osteopenia. IMPRESSION: 1. Small to moderate-sized right pleural effusion, decreased in size following thoracentesis. 2. No pneumothorax. 3. No significant change in right basilar atelectasis and possible pneumonia. 4. Increased left basilar atelectasis and possible pneumonia. 5. Stable small left pleural effusion. Electronically Signed   By: Elspeth Bathe M.D.   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 Request for diagnostic and therapeutic thoracentesis. EXAM: ULTRASOUND GUIDED DIAGNOSTIC AND THERAPEUTIC, RIGHT-SIDED THORACENTESIS MEDICATIONS: 1% lidocaine , 8 mL. COMPLICATIONS: None immediate. PROCEDURE: An ultrasound guided thoracentesis was thoroughly discussed with the patient and questions answered. The benefits, risks, alternatives and complications were also discussed. The patient understands and wishes to proceed with the procedure. Written consent was obtained. Ultrasound was performed and was notable for several loculations throughout the right chest. An adequate pocket of fluid was identified in the right chest. The area was then prepped and draped in the normal sterile fashion. 1% Lidocaine  was used for local anesthesia. Under ultrasound guidance a 6 Fr Safe-T-Centesis catheter was introduced. Thoracentesis was performed. Unfortunately, due to the septations, only a small amount of fluid was able to be drained. Residual fluid noted to the right lateral chest on post procedure ultrasound. The catheter was removed and a dressing applied. FINDINGS: A total of approximately 450 mL of  amber fluid was removed. Samples were sent to the laboratory as requested by the clinical team. IMPRESSION: Successful ultrasound guided right-sided thoracentesis yielding 450  mL of pleural fluid. Procedure performed by: Sherrilee Bal, PA-C under the supervision of Dr. ONEIDA Specking. Electronically Signed   By: CHRISTELLA.  Shick M.D.   On: 07/20/2024 11:14   DG Chest 2 View Result Date: 07/18/2024 CLINICAL DATA:  Chest pain and shortness of breath EXAM: CHEST - 2 VIEW COMPARISON:  07/02/2024 FINDINGS: Frontal and lateral views of the chest demonstrate stable postsurgical changes from median sternotomy and aortic valve replacement. Cardiac silhouette is enlarged but stable. Persistent bibasilar veiling opacities consistent with consolidation and effusions, right greater than left. No pneumothorax. No acute bony abnormalities. IMPRESSION: 1. Stable bibasilar consolidation and effusions, right greater than left. 2. Stable enlarged cardiac silhouette. Electronically Signed   By: Ozell Daring M.D.   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 Continue GDMT with Jardiance , carvedilol . Continue aspirin , statin Continue amlodipine  and Imdur . Recommend goal hemoglobin greater than 8  Signed: Keller JAYSON Paterson MD 08/13/2024, 8:41 AM

## 2024-08-13 NOTE — Evaluation (Signed)
 Physical Therapy Evaluation Patient Details Name: [name] MRN: [id number] DOB: Mar 16, 1946 Today's Date: 08/13/2024  History of Present Illness  Pt is a 79 y/o M admitted on 08/10/24 after presenting with c/o exertional SOB, chest pain. Pt is being treated for acute hypoxic respiratory failure, acute on chronic HFpEF, B pleural effusions. Pt is s/p thoracentesis on 08/10/24. PMH: HTN, CKD 3B, paroxysmal a-fib, CAD s/p CABG & DES, HFpEF, TAVR, severe pulmonary HTN, 2 hospital admissions since November 2025 with thoracentesis 2/2 pleural effusion & symptomatic anemia  Clinical Impression  Pt seen for PT evaluation with pt agreeable to tx. Pt reports he has recently started using 3L/min at all times at home, denies falls. On this date, pt is able to ambulate in room without AD with supervision, SPO2 drops to 89% after gait but pt able to recover to >90% with rest. Recommend ongoing PT services to progress mobility as able.        If plan is discharge home, recommend the following: Assistance with cooking/housework;Assist for transportation   Can travel by private vehicle        Equipment Recommendations None recommended by PT  Recommendations for Other Services       Functional Status Assessment Patient has had a recent decline in their functional status and demonstrates the ability to make significant improvements in function in a reasonable and predictable amount of time.     Precautions / Restrictions Precautions Precautions: Fall Precaution/Restrictions Comments: monitor O2 Restrictions Weight Bearing Restrictions Per Provider Order: No      Mobility  Bed Mobility               General bed mobility comments: not tested, pt received & left sitting on EOB    Transfers Overall transfer level: Independent Equipment used: None                    Ambulation/Gait Ambulation/Gait assistance: Supervision Gait Distance (Feet): 15 Feet Assistive device:  None Gait Pattern/deviations: Decreased step length - right, Decreased step length - left, Decreased stride length Gait velocity: decreased     General Gait Details: ambulates to door & back without AD, no LOB  Stairs            Wheelchair Mobility     Tilt Bed    Modified Rankin (Stroke Patients Only)       Balance Overall balance assessment: Needs assistance   Sitting balance-Leahy Scale: Normal     Standing balance support: During functional activity, No upper extremity supported Standing balance-Leahy Scale: Good                               Pertinent Vitals/Pain Pain Assessment Pain Assessment: No/denies pain    Home Living Family/patient expects to be discharged to:: Private residence Living Arrangements: Spouse/significant other Available Help at Discharge: Family Type of Home: House Home Access: Stairs to enter Entrance Stairs-Rails: Right;Left;Can reach both Entrance Stairs-Number of Steps: 3   Home Layout: One level Home Equipment: Cane - single Librarian, Academic (2 wheels);Rollator (4 wheels);Wheelchair - manual      Prior Function Prior Level of Function : Independent/Modified Independent             Mobility Comments: ambulates with SPC PRN, denies falls, on 3L home O2 now, gait distance limited by SOB & need to rest       Extremity/Trunk Assessment   Upper Extremity Assessment Upper  Extremity Assessment: Overall WFL for tasks assessed    Lower Extremity Assessment Lower Extremity Assessment: Overall WFL for tasks assessed    Cervical / Trunk Assessment Cervical / Trunk Assessment: Kyphotic (rounded shoulders)  Communication   Communication Communication: No apparent difficulties    Cognition Arousal: Alert Behavior During Therapy: WFL for tasks assessed/performed (can become irritated easily)   PT - Cognitive impairments: No apparent impairments                         Following commands:  Intact       Cueing Cueing Techniques: Verbal cues     General Comments General comments (skin integrity, edema, etc.): Pt received & left on 3L/min, SPO2 drops to 89% after short distance gait in room but recovers with rest & pursed lip breathing to 94%. Pt sitting EOB at beginning of session requesting to take supplemental O2 off & SpO2 drops from 99 to 90% just sitting EOB; pt placed nasal cannula back in nose.    Exercises     Assessment/Plan    PT Assessment Patient needs continued PT services  PT Problem List Cardiopulmonary status limiting activity;Decreased activity tolerance;Decreased balance;Decreased mobility;Decreased strength;Decreased safety awareness       PT Treatment Interventions DME instruction;Balance training;Gait training;Stair training;Functional mobility training;Patient/family education;Therapeutic activities;Therapeutic exercise;Neuromuscular re-education    PT Goals (Current goals can be found in the Care Plan section)  Acute Rehab PT Goals Patient Stated Goal: get better PT Goal Formulation: With patient Time For Goal Achievement: 08/27/24 Potential to Achieve Goals: Good    Frequency Min 1X/week     Co-evaluation               AM-PAC PT 6 Clicks Mobility  Outcome Measure Help needed turning from your back to your side while in a flat bed without using bedrails?: None Help needed moving from lying on your back to sitting on the side of a flat bed without using bedrails?: None Help needed moving to and from a bed to a chair (including a wheelchair)?: None Help needed standing up from a chair using your arms (e.g., wheelchair or bedside chair)?: None Help needed to walk in hospital room?: A Little Help needed climbing 3-5 steps with a railing? : A Little 6 Click Score: 22    End of Session Equipment Utilized During Treatment: Oxygen Activity Tolerance: Patient tolerated treatment well Patient left: with call bell/phone within reach  (sitting EOB) Nurse Communication:  (requested acapella flutter valve from MD 2/2 pt reporting he's coughing stuff up at times.) PT Visit Diagnosis: Difficulty in walking, not elsewhere classified (R26.2);Muscle weakness (generalized) (M62.81);Unsteadiness on feet (R26.81)    Time: 9156-9141 PT Time Calculation (min) (ACUTE ONLY): 15 min   Charges:   PT Evaluation $PT Eval Moderate Complexity: 1 Mod   PT General Charges $$ ACUTE PT VISIT: 1 Visit         Richerd Pinal, PT, DPT 08/13/2024, 9:08 AM   Richerd CHRISTELLA Pinal 08/13/2024, 9:07 AM

## 2024-08-13 NOTE — Plan of Care (Signed)

## 2024-08-13 NOTE — Evaluation (Signed)
 Occupational Therapy Evaluation Patient Details Name: [name] MRN: [id number] DOB: 01/14/1946 Today's Date: 08/13/2024   History of Present Illness   Pt is a 79 y/o M admitted on 08/10/24 after presenting with c/o exertional SOB, chest pain. Pt is being treated for acute hypoxic respiratory failure, acute on chronic HFpEF, B pleural effusions. Pt is s/p thoracentesis on 08/10/24. PMH: HTN, CKD 3B, paroxysmal a-fib, CAD s/p CABG & DES, HFpEF, TAVR, severe pulmonary HTN, 2 hospital admissions since November 2025 with thoracentesis 2/2 pleural effusion & symptomatic anemia     Clinical Impressions Upon entering the room, pt seated on EOB with son present in room. Pt getting blood transfusion at this time. Pt endorses living at home with spouse and being independent for self care and mobility needs. Pt on 2Ls of O2 at baseline. Pt demonstrates figure four position for LB dressing. He endorses not having any concerns in regards of self care or mobility and does not feel that he needs continued OT intervention. OT agrees. OT to complete orders at this time.      If plan is discharge home, recommend the following:   Help with stairs or ramp for entrance;Assist for transportation     Functional Status Assessment   Patient has not had a recent decline in their functional status     Equipment Recommendations   None recommended by OT      Precautions/Restrictions   Precautions Precautions: Fall Recall of Precautions/Restrictions: Intact Precaution/Restrictions Comments: monitor O2 Restrictions Weight Bearing Restrictions Per Provider Order: No     Mobility Bed Mobility               General bed mobility comments: not tested, pt received & left sitting on EOB    Transfers Overall transfer level: Independent Equipment used: None                      Balance Overall balance assessment: Needs assistance Sitting-balance support: Feet supported Sitting  balance-Leahy Scale: Normal                                     ADL either performed or assessed with clinical judgement   ADL Overall ADL's : At baseline;Modified independent                                             Vision Patient Visual Report: No change from baseline              Pertinent Vitals/Pain Pain Assessment Pain Assessment: No/denies pain     Extremity/Trunk Assessment Upper Extremity Assessment Upper Extremity Assessment: Overall WFL for tasks assessed   Lower Extremity Assessment Lower Extremity Assessment: Overall WFL for tasks assessed   Cervical / Trunk Assessment Cervical / Trunk Assessment: Kyphotic   Communication Communication Communication: No apparent difficulties   Cognition   Behavior During Therapy: WFL for tasks assessed/performed Cognition: No apparent impairments                               Following commands: Intact       Cueing  General Comments   Cueing Techniques: Verbal cues              Home  Living Family/patient expects to be discharged to:: Private residence Living Arrangements: Spouse/significant other Available Help at Discharge: Family Type of Home: House Home Access: Stairs to enter Secretary/administrator of Steps: 3 Entrance Stairs-Rails: Right;Left;Can reach both Home Layout: One level     Bathroom Shower/Tub: Producer, Television/film/video: Standard     Home Equipment: Cane - single Librarian, Academic (2 wheels);Rollator (4 wheels);Wheelchair - manual          Prior Functioning/Environment Prior Level of Function : Independent/Modified Independent               ADLs Comments: Ind with self care and family assists as needed with IADLs            OT Goals(Current goals can be found in the care plan section)   Acute Rehab OT Goals Patient Stated Goal: to go home OT Goal Formulation: With patient Time For Goal Achievement:  08/13/24 Potential to Achieve Goals: Good         AM-PAC OT 6 Clicks Daily Activity     Outcome Measure Help from another person eating meals?: None Help from another person taking care of personal grooming?: None Help from another person toileting, which includes using toliet, bedpan, or urinal?: None Help from another person bathing (including washing, rinsing, drying)?: A Little Help from another person to put on and taking off regular upper body clothing?: None Help from another person to put on and taking off regular lower body clothing?: None 6 Click Score: 23   End of Session Equipment Utilized During Treatment: Oxygen  Activity Tolerance: Patient tolerated treatment well Patient left: with call bell/phone within reach;in bed;with family/visitor present                   Time: 8891-8881 OT Time Calculation (min): 10 min Charges:  OT General Charges $OT Visit: 1 Visit OT Evaluation $OT Eval Low Complexity: 1 Low  Izetta Claude, MS, OTR/L , CBIS ascom [phone]  08/13/2024, 1:42 PM

## 2024-08-13 NOTE — Progress Notes (Signed)
 "    Progress Note    [name]  [name] DOB: 05/14/1946  DOA: 08/10/2024 PCP: [name], MD      Brief Narrative:    Medical records reviewed and are as summarized below:  [name] is a 79 y.o. 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 Discharged on 07/23/24 after presenting with respiratory symptoms secondary to recurrent right pleural effusion as well as symptomatic anemia. He underwent a thoracentesis at that time and 450 cc of fluid was obtained. CT chest, abdomen and pelvis demonstrated progressive mediastinal adenopathy suggestive of either chronic inflammatory process or low-grade lymphoproliferative process. Subsequent cytology was negative for any malignancy. He was discharged home on base level 4 L O2. Because of the severity of pulmonary hypertension Watchman procedure has been delayed according to the patient. He is currently not on anticoagulation due to recurrent GI bleeding and symptomatic anemia requiring transfusions. His anemia is multifactorial secondary to iron  deficiency with chronic blood loss as well as RBC destruction in setting of TAVR.   He presented to the ED again  on 08/10/2024 with complaints of exertional shortness of breath and chest pain that had been going on for over a week.  Initially complained of increasing lower extremity edema worse than baseline.   proBNP 15,340  Chest x-ray IMPRESSION: 1. Moderate bilateral pleural effusions with mild pulmonary edema and bibasilar atelectasis/airspace disease.   Assessment/Plan:    Principal Problem:   Acute heart failure with preserved ejection fraction (HFpEF) (HCC) Active Problems:   Acute heart failure with preserved ejection fraction (HCC)    Body mass index is 25.95 kg/m.   Acute on chronic HFpEF cardiology, moderate to severe mitral regurgitation: Continue IV Lasix . Monitor daily weight, urine output and BMP.  Follow-up with cardiologist. 2D echo in July 2025 showed EF estimated at 66 5%, moderate to severe MR, mild to moderate TR   Moderate bilateral pleural effusion: S/p left thoracentesis with removal of 700 mL of pleural fluid on 08/10/2024   Severe pulmonary hypertension: Follow-up with pulmonologist.     Chronic hypoxic respiratory failure: Uses 2 L/min oxygen as needed at home.  However, he said he has been using it regularly of late.   Elevated troponins: Is likely from demand ischemia.   Permanent atrial fibrillation: Not on anticoagulation because of chronic anemia and history of GI bleed    Acute on chronic anemia: Hemoglobin unchanged at 7.1 today. Transfuse another unit of PRBCs because of underlying CAD and CHF. S/p transfusion 1 unit of PRBCs on 08/12/2024. Patient said he has had multiple transfusion in the past and he is okay with blood transfusion.   CKD stage IIIb: Creatinine is stable    Comorbidities include CAD s/p CABG x 3, s/p coronary stent x 2, hypertension, hyperlipidemia, OSA, CPAP intolerant)   Diet Order             Diet Heart Room service appropriate? Yes; Fluid consistency: Thin  Diet effective now  Consultants: Cardiologist Pulmonologist Interventional radiologist  Procedures: Left thoracentesis on 08/10/2024    Medications:    sodium chloride    Intravenous Once   amLODipine   10 mg Oral Daily   aspirin  EC  81 mg Oral Daily   atorvastatin   80 mg Oral Daily   carvedilol   3.125 mg Oral BID WC   cyanocobalamin   1,000 mcg Oral Daily    empagliflozin   10 mg Oral QAC breakfast   ferrous sulfate   325 mg Oral Daily   furosemide   60 mg Intravenous Q12H   heparin   5,000 Units Subcutaneous Q8H   isosorbide  mononitrate  120 mg Oral Daily   pantoprazole   40 mg Oral Daily   sodium chloride  flush  3 mL Intravenous Q12H   Continuous Infusions:   Anti-infectives (From admission, onward)    None              Family Communication/Anticipated D/C date and plan/Code Status   DVT prophylaxis: heparin  injection 5,000 Units Start: 08/10/24 2200     Code Status: Full Code  Family Communication: None Disposition Plan: Plan to discharge home   Status is: Inpatient Remains inpatient appropriate because: CHF exacerbation       Subjective:   Interval events noted.  He complains of shortness of breath with minimal exertion.  Chest pain has improved and he attributes improvement in chest pain to blood transfusion.  Objective:    Vitals:   08/13/24 1043 08/13/24 1059 08/13/24 1136 08/13/24 1332  BP: (!) 154/57 (!) 141/67 (!) 140/56 (!) 149/69  Pulse: 67 77 62 63  Resp: 20  17 20   Temp: 97.9 F (36.6 C) 98.4 F (36.9 C) 97.9 F (36.6 C) 98.3 F (36.8 C)  TempSrc: Oral Oral  Oral  SpO2: 98% 100% 100% 100%  Weight:      Height:       No data found.   Intake/Output Summary (Last 24 hours) at 08/13/2024 1544 Last data filed at 08/13/2024 1055 Gross per 24 hour  Intake 592 ml  Output 1000 ml  Net -408 ml   Filed Weights   08/10/24 0710 08/12/24 0500 08/13/24 0500  Weight: 86.2 kg 86.1 kg 88 kg    Exam:   GEN: NAD SKIN: Warm and dry EYES: No pallor or icterus ENT: MMM CV: RRR PULM: CTA B ABD: soft, ND, NT, +BS CNS: AAO x 3, non focal EXT: Bilateral leg and pedal edema, no erythema or tenderness      Data Reviewed:   I have personally reviewed following labs and imaging studies:  Labs: Labs show the following:   Basic Metabolic Panel: Recent Labs  Lab 08/10/24 0732 08/11/24 0521  08/12/24 0408 08/13/24 0359  NA 141 141 139 137  K 4.7 4.5 4.3 4.3  CL 102 102 100 99  CO2 28 31 30 31   GLUCOSE 168* 152* 153* 155*  BUN 37* 38* 42* 43*  CREATININE 1.67* 1.74* 1.78* 1.85*  CALCIUM  11.1* 10.2 10.0 10.2  MG  --   --   --  2.2   GFR Estimated Creatinine Clearance: 37.9 mL/min (A) (by C-G formula based on SCr of 1.85 mg/dL (H)). Liver Function Tests: Recent Labs  Lab 08/10/24 0732  AST 45*  ALT 12  ALKPHOS 129*  BILITOT 1.9*  PROT 8.1  ALBUMIN 4.2   No results for input(s): LIPASE, AMYLASE in the last 168 hours. No results for input(s): AMMONIA in the last 168 hours. Coagulation profile No results for input(s): INR, PROTIME  in the last 168 hours.  CBC: Recent Labs  Lab 08/10/24 0732 08/12/24 0408 08/13/24 0359 08/13/24 1417  WBC 8.9 5.7 5.9  --   HGB 8.0* 7.1* 7.1* 8.0*  HCT 26.1* 22.3* 22.5* 24.6*  MCV 102.8* 101.8* 101.8*  --   PLT 159 111* 94*  --    Cardiac Enzymes: No results for input(s): CKTOTAL, CKMB, CKMBINDEX, TROPONINI in the last 168 hours. BNP (last 3 results) Recent Labs    07/01/24 1220 07/18/24 1707 08/10/24 0732  PROBNP 12,167.0* 13,853.0* 15,340.0*   CBG: No results for input(s): GLUCAP in the last 168 hours. D-Dimer: No results for input(s): DDIMER in the last 72 hours. Hgb A1c: No results for input(s): HGBA1C in the last 72 hours. Lipid Profile: No results for input(s): CHOL, HDL, LDLCALC, TRIG, CHOLHDL, LDLDIRECT in the last 72 hours. Thyroid  function studies: No results for input(s): TSH, T4TOTAL, T3FREE, THYROIDAB in the last 72 hours.  Invalid input(s): FREET3 Anemia work up: No results for input(s): VITAMINB12, FOLATE, FERRITIN, TIBC, IRON , RETICCTPCT in the last 72 hours. Sepsis Labs: Recent Labs  Lab 08/10/24 0732 08/12/24 0408 08/13/24 0359  WBC 8.9 5.7 5.9    Microbiology No results found for this or any previous visit (from the past 240  hours).  Procedures and diagnostic studies:  No results found.              LOS: 2 days   Torrance Stockley  [facility]   Pager on [email or web address]. If 7PM-7AM, please contact night-coverage at [email or web address]     08/13/2024, 3:44 PM           "

## 2024-08-14 DIAGNOSIS — I5031 Acute diastolic (congestive) heart failure: Secondary | ICD-10-CM | POA: Diagnosis not present

## 2024-08-14 LAB — BPAM RBC
Blood Product Expiration Date: 202601052359
Blood Product Expiration Date: 202601312359
ISSUE DATE / TIME: 202512311419
ISSUE DATE / TIME: 202601011038
Unit Type and Rh: 6200
Unit Type and Rh: 6200

## 2024-08-14 LAB — RENAL FUNCTION PANEL
Albumin: 3.5 g/dL (ref 3.5–5.0)
Anion gap: 7 (ref 5–15)
BUN: 46 mg/dL — ABNORMAL HIGH (ref 8–23)
CO2: 34 mmol/L — ABNORMAL HIGH (ref 22–32)
Calcium: 10.1 mg/dL (ref 8.9–10.3)
Chloride: 99 mmol/L (ref 98–111)
Creatinine, Ser: 1.95 mg/dL — ABNORMAL HIGH (ref 0.61–1.24)
GFR, Estimated: 35 mL/min — ABNORMAL LOW
Glucose, Bld: 168 mg/dL — ABNORMAL HIGH (ref 70–99)
Phosphorus: 3.3 mg/dL (ref 2.5–4.6)
Potassium: 4.6 mmol/L (ref 3.5–5.1)
Sodium: 139 mmol/L (ref 135–145)

## 2024-08-14 LAB — TYPE AND SCREEN
ABO/RH(D): A POS
Antibody Screen: NEGATIVE
Unit division: 0
Unit division: 0

## 2024-08-14 LAB — MAGNESIUM: Magnesium: 2.1 mg/dL (ref 1.7–2.4)

## 2024-08-14 MED ORDER — FUROSEMIDE 10 MG/ML PO SOLN
40.0000 mg | Freq: Two times a day (BID) | ORAL | Status: DC
  Filled 2024-08-14: qty 5

## 2024-08-14 MED ORDER — TORSEMIDE 20 MG PO TABS
60.0000 mg | ORAL_TABLET | Freq: Every day | ORAL | Status: DC
  Administered 2024-08-15: 60 mg via ORAL
  Filled 2024-08-14: qty 3

## 2024-08-14 MED ORDER — POLYETHYLENE GLYCOL 3350 17 G PO PACK
17.0000 g | PACK | Freq: Every day | ORAL | Status: DC
  Administered 2024-08-14: 17 g via ORAL
  Filled 2024-08-14 (×2): qty 1

## 2024-08-14 MED ORDER — SENNOSIDES-DOCUSATE SODIUM 8.6-50 MG PO TABS: 1.0000 | ORAL_TABLET | Freq: Every evening | ORAL | Status: DC | PRN

## 2024-08-14 NOTE — Care Management Important Message (Signed)
 Important Message  Patient Details  Name: [name] MRN: [id number] Date of Birth: 14-Nov-1945   Important Message Given:  Yes - Medicare IM     [name] 08/14/2024, 12:42 PM

## 2024-08-14 NOTE — Progress Notes (Signed)
 "    PULMONOLOGY         Date: 08/14/2024,   MRN# [id number] [name] 1945/12/10     AdmissionWeight: 86.2 kg                 CurrentWeight: 87.7 kg  Referring provider: Dr Trudy   CHIEF COMPLAINT:   Acute hypoxemic respiratory failure   HISTORY OF PRESENT ILLNESS   This is a 79 y.o. 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  Discharged on 07/23/24 after presenting with respiratory symptoms secondary to recurrent right pleural effusion as well as symptomatic anemia.  He underwent a thoracentesis at that time and 450 cc of fluid was obtained.  CT chest, abdomen and pelvis demonstrated progressive mediastinal adenopathy suggestive of either chronic inflammatory process or low-grade lymphoproliferative process.  Subsequent cytology was negative for any malignancy.  He was discharged home on base level 4 L O2.  Because of the severity of pulmonary hypertension Watchman procedure has been delayed according to the patient.  He is currently not on anticoagulation due to recurrent GI bleeding and symptomatic anemia requiring transfusions.  Patient presents to ER with chest discomfort and dyspnea x 1wk. Reports medication noncompliance.  Had chest imaging with bilateral pleural effusions. Cardiology has been consulted and has initiated GDMT.  IR has been consulted and has performed thoracentesis with 700cc removed from left pleural space. PCCM consultation placed for additional evaluation and management of patient with worsening pulmonary hypertension and hypoxemic respiratory failure.   08/12/24- patient net  3L/negative now with diuresis in addition to the removed via left thora. He is on ambient air now during my evaluation . He is on BID 60mg  lasix  IV and his renal function is with stable CKD. Overall he is improving. No need for additional pulmonary hypertension meds at this time. He may need biopsy of his enlarged lymph nodes around lungs at hilar and mediastinal stations. This can be arranged on outpatient.   08/14/24- patient is doing well.  He is cleared from pulmonary for dc home and we can follow up on outpatient basis for ph therapy  PAST MEDICAL HISTORY   Past Medical History:  Diagnosis Date   (HFpEF) heart failure with preserved ejection fraction (HCC) 07/25/2016   a.)TTE 07/25/16: EF >55, triv-mild pan regur, mild AS, G2DD; b.)TTE 09/02/16: EF >55, triv TR, mild AS; c.)TTE 11/05/16: EF >55, LAE, triv-mild pan regur, mild AS, G2DD; d.)TTE 01/21/20: EF >55, mod LVH, LAE/RVE, triv-mild AR/PR/TR, mod MR, mild AS, G2DD; e.)TTE 09/24/22: EF>55, sev LVH, sev LAE, RAE, mild PR, mod AR/MR/TR, mild-mod AS, RVSP 74.7; f.)TTE 12/23/22: EF 60-65, LAE, mild-mod MR/TR, mild AS   Abnormal urine 05/01/2023   Anemia    Aortic atherosclerosis    Aortic stenosis 07/25/2016   a.) TTE 07/25/2016: mild (MPG 13.5); b.) TTE 09/02/2016: mild (MPG 16); c.) TTE 11/05/2016: mild (MPG 9); d.) TTE 01/21/2020: mild (MPG 11.7); e.) TTE 09/24/2022: mild-mod (MPG 20.3); f.) TTE 12/23/2022: mild (MPG 15)   Atrial fibrillation (HCC)    a.) CHA2DS2VASc = 6 (age x2, CHF, HTN, vascular disease history, T2DM);  b.) rate/rhythm maintained on oral labetalol ; chronically  anticoagulated with apixaban  + clopidogrel    AV block, 1st degree    B12 deficiency    CKD (chronic kidney disease), stage III (HCC)    Colon polyps    Congestive heart failure (HCC) 05/01/2023   Coronary artery disease 08/16/2016   a.) s/p 3v CABG 08/30/2016; b.) s/p PCI 11/26/2016 (DES x 1 oSVG-RCA); c.) s/p PCI 12/17/2016 (DES x 5 --> mLCx x2, dLCx, pLAD,  dLAD); c.) s/p PCI 06/09/2020 (DES x1 --> ISR oSVG-PDA)   Cyst of kidney, acquired 07/03/2023   Dyspnea    Gastroesophageal reflux disease 05/01/2023   GERD (gastroesophageal reflux disease)    Heart murmur    Hepatic steatosis    History of 2019 novel coronavirus disease (COVID-19) 09/02/2020   a.) Tx'd with remdesivir    History of blood transfusion    History of GI bleed 09/02/2020   a.) admitted to [facility] 09/02/2020 - 09/08/2020   History of heart artery stent 11/26/2016   TOTAL of 7 stents: a.) 11/26/2016 --> 3.5 x 22 mm Resolute Integrity oSVG-RCA; b.) 12/17/2016: 3.0 x 12 mm Xience Alpine dLCx, 3.5 x 22 mm Resolute Onyx mLCx, 3.5 x 18 mm Resolute Onyx mLCx, 3.0 x 26 mm Resolute Onyx pLAD, 2.0 x 26 mm Resolute Onyx dLAD; c.) 06/09/2020 --> 2.5 x 18 mm Resolute Onyx ISR oSVG-PDA   History of partial colectomy 08/12/2012   a.) large gastric hyperplastic polyp removal   HLD (hyperlipidemia)    Hypertension    LBBB (left bundle branch block)    Long term current use of anticoagulant    a.) apixaban    Long term current use of antithrombotics/antiplatelets    a.) clopidogrel    Male hypogonadism    a.) on exogenous TRT (1.62% gel)   Mild cardiomegaly    Mild gynecomastia (bilateral)    Nephrolithiasis    OSA (obstructive sleep apnea)    a.) does not utilize nocturnal PAP therapy   Personal history of surgery to heart and great vessels, presenting hazards to health 05/01/2023   Proteinuria, unspecified 08/01/2023   Pulmonary hypertension (HCC) 09/24/2022   a.) TTE 09/24/2022: RVSP 74.7; b.) TTE 12/23/2022: RVSP 31.3   RBBB (right bundle branch block with left anterior fascicular block)    Right inguinal hernia    Right thyroid  nodule    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   a.) LIMA-LAD, SVG-PDA, SVG-OM3   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/01/2023   Type 2 diabetes mellitus treated with insulin  (HCC)    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    Umbilical hernia    Unstable angina (HCC)    Vitamin D  deficiency      SURGICAL HISTORY   Past Surgical History:  Procedure Laterality Date   ANOMALOUS PULMONARY VENOUS RETURN REPAIR, TOTAL  2025   APPENDECTOMY     CARDIAC CATHETERIZATION Left 08/16/2016   Procedure: Left Heart Cath and Coronary Angiography;  Surgeon: Cara JONETTA Lovelace, MD;  Location: [facility] IN[facility]IVE [facility];  Service: Cardiovascular;  Laterality: Left;   COLONOSCOPY N/A 09/07/2020   Procedure: COLONOSCOPY;  Surgeon: Janalyn Keene NOVAK, MD;  Location: [facility] ENDOSCOPY;  Service: Endoscopy;  Laterality: N/A;   COLONOSCOPY N/A 10/19/2023   Procedure: COLONOSCOPY;  Surgeon: Jinny Carmine, MD;  Location[facility] ENDOSCOPY;  Service: Endoscopy;  Laterality: N/A;   COLONOSCOPY WITH PROPOFOL  N/A 05/17/2021   Procedure: COLONOSCOPY WITH PROPOFOL ;  Surgeon: Janalyn Keene NOVAK, MD;  Location: [facility] ENDOSCOPY;  Service: Endoscopy;  Laterality: N/A;  CORONARY ANGIOPLASTY WITH STENT PLACEMENT Left 11/26/2016   Procedure: CORONARY ANGIOPLASTY WITH STENT PLACEMENT; Location: [facility]; Surgeon: Alm Dais, MD   CORONARY ARTERY BYPASS GRAFT N/A 08/30/2016   Procedure: CORONARY ARTERY BYPASS GRAFT; Location: [facility]; Surgeon: Juliene Pouch, MD   CORONARY STENT INTERVENTION N/A 06/09/2020   Procedure: CORONARY STENT INTERVENTION;  Surgeon: Florencio Cara BIRCH, MD;  Location: [facility];  Service: Cardiovascular;  Laterality: N/A;   CORONARY STENT INTERVENTION Left 12/17/2016   Procedure: CORONARY STENT INTERVENTION; Location: [facility]; Surgeon: Alm Dais, MD   CORONARY STENT INTERVENTION  03/2024   ENDOSCOPIC MUCOSAL RESECTION N/A 09/22/2020   Procedure: ENDOSCOPIC MUCOSAL RESECTION;  Surgeon: [name] Aloha Raddle., MD;  Location: [facility] ENDOSCOPY;  Service: Gastroenterology;  Laterality: N/A;   ESOPHAGOGASTRODUODENOSCOPY N/A 09/07/2020   Procedure: ESOPHAGOGASTRODUODENOSCOPY (EGD);  Surgeon: Janalyn Keene NOVAK, MD;  Location: [facility] ENDOSCOPY;   Service: Endoscopy;  Laterality: N/A;   ESOPHAGOGASTRODUODENOSCOPY N/A 10/19/2023   Procedure: EGD (ESOPHAGOGASTRODUODENOSCOPY);  Surgeon: Jinny Carmine, MD;  Location: [facility] ENDOSCOPY;  Service: Endoscopy;  Laterality: N/A;   ESOPHAGOGASTRODUODENOSCOPY (EGD) WITH PROPOFOL  N/A 09/22/2020   Procedure: ESOPHAGOGASTRODUODENOSCOPY (EGD) WITH PROPOFOL ;  Surgeon: [name] Aloha Raddle., MD;  Location: [facility] ENDOSCOPY;  Service: Gastroenterology;  Laterality: N/A;   ESOPHAGOGASTRODUODENOSCOPY (EGD) WITH PROPOFOL  N/A 08/26/2023   Procedure: ESOPHAGOGASTRODUODENOSCOPY (EGD) WITH PROPOFOL ;  Surgeon: Jinny Carmine, MD;  Location: [facility] ENDOSCOPY;  Service: Endoscopy;  Laterality: N/A;   FRACTURE SURGERY     HEMOSTASIS CLIP PLACEMENT  09/22/2020   Procedure: HEMOSTASIS CLIP PLACEMENT;  Surgeon: [name] Aloha Raddle., MD;  Location: [facility] ENDOSCOPY;  Service: Gastroenterology;;   HEMOSTASIS CONTROL  09/22/2020   Procedure: HEMOSTASIS CONTROL;  Surgeon: [name] Aloha Raddle., MD;  Location: [facility] ENDOSCOPY;  Service: Gastroenterology;;   INSERTION OF MESH  02/19/2023   Procedure: INSERTION OF MESH;  Surgeon: Desiderio Schanz, MD;  Location: [facility] ORS;  Service: General;;  umbilical   LAPAROSCOPIC PARTIAL RIGHT COLECTOMY Right 08/12/2012   Procedure: LAPAROSCOPIC PARTIAL RIGHT COLECTOMY; Location: [facility]; Surgeon: Unknown Sharps, MD   LEFT HEART CATH AND CORONARY ANGIOGRAPHY Left 06/09/2020   Procedure: LEFT HEART CATH AND CORONARY ANGIOGRAPHY;  Surgeon: Florencio Cara BIRCH, MD;  Location: [facility];  Service: Cardiovascular;  Laterality: Left;   LEFT HEART CATH AND CORS/GRAFTS ANGIOGRAPHY Left 11/20/2016   Procedure: Left Heart Cath and Cors/Grafts Angiography;  Surgeon: Cara BIRCH Florencio, MD;  Location: [facility];  Service: Cardiovascular;  Laterality: Left;   POLYPECTOMY  09/22/2020   Procedure: POLYPECTOMY;  Surgeon: [name] Aloha Raddle., MD;  Location: [facility] ENDOSCOPY;  Service: Gastroenterology;;    POLYPECTOMY  10/19/2023   Procedure: POLYPECTOMY;  Surgeon: Jinny Carmine, MD;  Location: [facility] ENDOSCOPY;  Service: Endoscopy;;   RIGHT HEART CATH Right 07/01/2024   Procedure: RIGHT HEART CATH;  Surgeon: Ammon Blunt, MD;  Location: [facility];  Service: Cardiovascular;  Laterality: Right;   SUBMUCOSAL LIFTING INJECTION  09/22/2020   Procedure: SUBMUCOSAL LIFTING INJECTION;  Surgeon: [name] Aloha Raddle., MD;  Location: [facility] ENDOSCOPY;  Service: Gastroenterology;;   UMBILICAL HERNIA REPAIR N/A 02/19/2023   Procedure: HERNIA REPAIR UMBILICAL ADULT, open;  Surgeon: Desiderio Schanz, MD;  Location: [facility] ORS;  Service: General;  Laterality: N/A;   XI ROBOTIC ASSISTED INGUINAL HERNIA REPAIR WITH MESH Right 02/19/2023   Procedure: XI ROBOTIC ASSISTED INGUINAL HERNIA REPAIR WITH MESH;  Surgeon: Desiderio Schanz, MD;  Location: [facility] ORS;  Service: General;  Laterality: Right;     FAMILY HISTORY   Family History  Problem Relation  Age of Onset   Hyperlipidemia Mother    Heart disease Mother    Hypertension Father      SOCIAL HISTORY   Social History[1]   MEDICATIONS    Home Medication:     Current Medication: Current Medications[2]    ALLERGIES   Patient has no known allergies.     REVIEW OF SYSTEMS    Review of Systems:  Gen:  Denies  fever, sweats, chills weigh loss  HEENT: Denies blurred vision, double vision, ear pain, eye pain, hearing loss, nose bleeds, sore throat Cardiac:  No dizziness, chest pain or heaviness, chest tightness,edema Resp:   reports dyspnea chronically  Gi: Denies swallowing difficulty, stomach pain, nausea or vomiting, diarrhea, constipation, bowel incontinence Gu:  Denies bladder incontinence, burning urine Ext:   Denies Joint pain, stiffness or swelling Skin: Denies  skin rash, easy bruising or bleeding or hives Endoc:  Denies polyuria, polydipsia , polyphagia or weight change Psych:   Denies depression, insomnia or  hallucinations   Other:  All other systems negative   VS: BP (!) 140/64 (BP Location: Right Arm)   Pulse (!) 55   Temp (!) 97.5 F (36.4 C)   Resp 20   Ht 6' 0.5 (1.842 m)   Wt 87.7 kg   SpO2 100%   BMI 25.86 kg/m      PHYSICAL EXAM    GENERAL:NAD, no fevers, chills, no weakness no fatigue HEAD: Normocephalic, atraumatic.  EYES: Pupils equal, round, reactive to light. Extraocular muscles intact. No scleral icterus.  MOUTH: Moist mucosal membrane. Dentition intact. No abscess noted.  EAR, NOSE, THROAT: Clear without exudates. No external lesions.  NECK: Supple. No thyromegaly. No nodules. No JVD.  PULMONARY: decreased breath sounds with mild rhonchi worse at bases bilaterally.  CARDIOVASCULAR: S1 and S2. Regular rate and rhythm. No murmurs, rubs, or gallops. No edema. Pedal pulses 2+ bilaterally.  GASTROINTESTINAL: Soft, nontender, nondistended. No masses. Positive bowel sounds. No hepatosplenomegaly.  MUSCULOSKELETAL: No swelling, clubbing, or edema. Range of motion full in all extremities.  NEUROLOGIC: Cranial nerves II through XII are intact. No gross focal neurological deficits. Sensation intact. Reflexes intact.  SKIN: No ulceration, lesions, rashes, or cyanosis. Skin warm and dry. Turgor intact.  PSYCHIATRIC: Mood, affect within normal limits. The patient is awake, alert and oriented x 3. Insight, judgment intact.       IMAGING    Narrative & Impression EXAM: CT CHEST, ABDOMEN AND PELVIS WITHOUT CONTRAST 07/20/2024 06:52:00 PM   TECHNIQUE: CT of the chest, abdomen and pelvis was performed without the administration of intravenous contrast. Multiplanar reformatted images are provided for review. Automated exposure control, iterative reconstruction, and/or weight based adjustment of the mA/kV was utilized to reduce the radiation dose to as low as reasonably achievable.   COMPARISON: 08/20/2023   CLINICAL HISTORY: LDH elevation. Oncology wanted to see if  cancerous process. Okay for oral contrast but not intravenous.   FINDINGS:   CHEST: MEDIASTINUM AND LYMPH NODES: Status post transcatheter aortic valve replacement and coronary artery bypass grafting. Mild cardiomegaly. No pericardial effusion. The central pulmonary arteries are enlarged in keeping with changes of pulmonary arterial hypertension. Mild atherosclerotic calcification within the thoracic aorta. No aortic aneurysm. There is progressive pathologic mediastinal adenopathy. By example, previously measured left prevascular and precarinal lymph nodes now measure 12 mm (previously 10 mm) and 20 mm (previously 10 mm) in short axis diameter. The findings suggest either a chronic inflammatory or low-grade lymphoproliferative process. Stable 2.3 cm right thyroid  nodule,  better assessed on thyroid  sonogram of 10/12/2020. No follow up imaging is recommended.   LUNGS AND PLEURA: Small to moderate bilateral pleural effusions, right greater than left, appears similar to prior examination though there is new pleural thickening bilaterally as well as probable septation noted at the right basilar pleural space which is nonspecific in the setting of repeated thoracentesis. Small focus of pleural gas on the right may relate to recent thoracentesis. No focal consolidation or pulmonary edema. No pneumothorax.   ABDOMEN AND PELVIS: LIVER: The liver is unremarkable.   GALLBLADDER AND BILE DUCTS: Cholelithiasis without superimposed pericholecystic inflammatory change. No intra or extrahepatic biliary ductal dilation.   SPLEEN: No acute abnormality.   PANCREAS: No acute abnormality.   ADRENAL GLANDS: No acute abnormality.   KIDNEYS, URETERS AND BLADDER: Simple exophytic cortical cysts are seen within the kidneys bilaterally for which no follow up imaging is recommended. Of a hyperdense exophytic cortical cyst arises anteriorly but little lower pole of the right kidney but  is unchanged from prior examination and was better characterized as a simple cyst on that exam. Per consensus, no follow-up is needed for simple Bosniak type 1 and 2 renal cysts, unless the patient has a malignancy history or risk factors. 7 mm nonobstructing calculus within the lower pole of the left kidney. The kidneys are otherwise unremarkable. No stones in the ureters. No hydronephrosis. No perinephric or periureteral stranding. Urinary bladder is unremarkable.   GI AND BOWEL: Moderate stool burden within the distal colon and rectum without evidence of obstruction. The stomach, small bowel, and large bowel are otherwise unremarkable. Appendix absent.   REPRODUCTIVE ORGANS: No acute abnormality.   PERITONEUM AND RETROPERITONEUM: No ascites. No free air.   VASCULATURE: Aorta is normal in caliber. Extensive aortic iliac atherosclerotic calcification.   ABDOMINAL AND PELVIS LYMPH NODES: Pathologic aortic cable adenopathy with a single lymph node measuring 15 mm in short axis diameter is stable since prior examination, nonspecific. No additional pathologic adenopathy within the abdomen and pelvis.   REPRODUCTIVE ORGANS: No acute abnormality.   BONES AND SOFT TISSUES: Osseous structures are age appropriate. No acute bone abnormality. No lytic or blastic bone lesion. No focal soft tissue abnormality.   IMPRESSION: 1. Progressive pathologic mediastinal adenopathy, suggestive of a chronic inflammatory or low-grade lymphoproliferative process. 2. Small to moderate bilateral pleural effusions, right greater than left, with new bilateral pleural thickening and probable right basilar pleural space septation; small right pleural gas likely post-thoracentesis. 3. Stable pathologic aortocaval adenopathy measuring 15 mm in short axis, nonspecific. 4. Stable 2.3 cm right thyroid  nodule; no follow-up imaging is recommended. 5. Cholelithiasis without acute inflammatory change and  without biliary ductal dilation. 6. Moderate stool burden in the distal colon and rectum without obstruction. 7. RAF score: Aortic Atherosclerosis (ICD10-I70.0).   Electronically signed by: Dorethia Molt MD 07/20/2024 08:42 PM EST RP Workstation: [id number]   ASSESSMENT/PLAN   Acute hypoxemic respiratory failure    - due to Recurrent pleural effusions with compressive atelectasis    - acute on chronic heart failure    - Chronic renal failure    -possible lymphoproliferative process with mediastinal and aortocaval adenopathy   Acute on chronic CHF    - cardiology on case - appreciate input - Dr Wilburn and Junette RIGGERS   -Per cardiology continue with BID diuresis , Continue home jardiance  10 mg daily, carvedilol  3.125 mg twice daily. Consider addition of MRA pending renal function.  -Continue aspirin  81 mg daily and atorvastatin  80 mg daily.  -  Continue amlodipine  10 mg daily and imdur  120 mg daily.  -hx of TAVF and AF Ref Range & Units (hover) 3 wk ago 1 mo ago  Pro Brain Natriuretic Peptide 13,853.0 High  12,167.0 High  CM     Bilateral pleural effusions - appreciate IR - s/p Throacentesis - Left side 700cc -pleural fluid studies- previous 07/20/24- negative culture/gram stain, negative cytology and flow cytometry, lymphocyte predominant. Elevated LDH (however patient on diuretics)  CKD 3B   - dc nonessential nephrotoxic medications  - contributing to fluid shift and hypoxemia   Rounded and compressiveatelectasis   - patient would benefit from chest physiotherapy   - metaneb with albuterol TID with RT  - PT/OT  - incentive spirometry    Pulmonary hypertension    Severe per RHC - 06/2024   Hemodynamic findings consistent with severe pulmonary hypertension.   1.         RA 15/15 mmHg            RV 87/21            PA 86/42  (51)            PCWP 45/24  (32)  -most likely due to Group 2 - cardiac related - Patient with severe MR -Currently NYHA 3-4 -agree with  diiuresis -supplemental O2 -optimization of underlying cardiac dysfunction -will consider additional PH medication after patient is more optimized from respiratory perspective    Thank you for allowing me to participate in the care of this patient.   Patient/Family are satisfied with care plan and all questions have been answered.    Provider disclosure: Patient with at least one acute or chronic illness or injury that poses a threat to life or bodily function and is being managed actively during this encounter.  All of the below services have been performed independently by signing provider:  review of prior documentation from internal and or external health records.  Review of previous and current lab results.  Interview and comprehensive assessment during patient visit today. Review of current and previous chest radiographs/CT scans. Discussion of management and test interpretation with health care team and patient/family.   This document was prepared using Dragon voice recognition software and may include unintentional dictation errors.     Eustacio Ellen, M.D.  Division of Pulmonary & Critical Care Medicine               [1]  Social History Tobacco Use   Smoking status: Former    Current packs/day: 0.00    Types: Cigarettes    Quit date: 2000    Years since quitting: 26.0    Passive exposure: Never   Smokeless tobacco: Never  Vaping Use   Vaping status: Never Used  Substance Use Topics   Alcohol use: No   Drug use: No  [2]  Current Facility-Administered Medications:    0.9 %  sodium chloride  infusion (Manually program via Guardrails IV Fluids), , Intravenous, Once, Jens Durand, MD   acetaminophen  (TYLENOL ) tablet 650 mg, 650 mg, Oral, Q6H PRN **OR** acetaminophen  (TYLENOL ) suppository 650 mg, 650 mg, Rectal, Q6H PRN, [place] Isaiah CROME, NP   amLODipine  (NORVASC ) tablet 10 mg, 10 mg, Oral, Daily, [place] Isaiah L, NP, 10 mg at 08/13/24 1012   aspirin  EC tablet  81 mg, 81 mg, Oral, Daily, Alto Isaiah CROME, NP, 81 mg at 08/13/24 1012   atorvastatin  (LIPITOR ) tablet 80 mg, 80 mg, Oral, Daily, Alto Isaiah CROME, NP, 80 mg at 08/13/24  1012   carvedilol  (COREG ) tablet 3.125 mg, 3.125 mg, Oral, BID WC, Alto Isaiah CROME, NP, 3.125 mg at 08/13/24 1559   cyanocobalamin  (VITAMIN B12) tablet 1,000 mcg, 1,000 mcg, Oral, Daily, Alto Isaiah CROME, NP, 1,000 mcg at 08/13/24 1012   empagliflozin  (JARDIANCE ) tablet 10 mg, 10 mg, Oral, QAC breakfast, Alto Isaiah CROME, NP, 10 mg at 08/13/24 1013   ferrous sulfate  tablet 325 mg, 325 mg, Oral, Daily, Alto Isaiah CROME, NP, 325 mg at 08/13/24 1012   furosemide  (LASIX ) injection 60 mg, 60 mg, Intravenous, Q12H, Alto Isaiah CROME, NP, 60 mg at 08/13/24 2036   heparin  injection 5,000 Units, 5,000 Units, Subcutaneous, Q8H, Alto Isaiah CROME, NP, 5,000 Units at 08/14/24 9393   isosorbide  mononitrate (IMDUR ) 24 hr tablet 120 mg, 120 mg, Oral, Daily, Alto Isaiah CROME, NP, 120 mg at 08/13/24 1012   ondansetron  (ZOFRAN ) tablet 4 mg, 4 mg, Oral, Q6H PRN **OR** ondansetron  (ZOFRAN ) injection 4 mg, 4 mg, Intravenous, Q6H PRN, Alto Isaiah CROME, NP   pantoprazole  (PROTONIX ) EC tablet 40 mg, 40 mg, Oral, Daily, Alto Isaiah L, NP, 40 mg at 08/13/24 1012   sodium chloride  flush (NS) 0.9 % injection 3 mL, 3 mL, Intravenous, Q12H, Alto Isaiah CROME, NP, 3 mL at 08/13/24 2130  "

## 2024-08-14 NOTE — Plan of Care (Signed)

## 2024-08-14 NOTE — Progress Notes (Addendum)
 [facility] Cardiology  CARDIOLOGY PROGRESS NOTE  Patient ID: [name] MRN: [id number] DOB/AGE: 1945-09-17 79 y.o.  Admit date: 08/10/2024 Referring Physician Dr. Jens Reason for Consultation Heart Failure  HPI: Feels breathing is better today.  Pedal edema improved  Review of systems complete and found to be negative unless listed above     Past Medical History:  Diagnosis Date   (HFpEF) heart failure with preserved ejection fraction (HCC) 07/25/2016   a.)TTE 07/25/16: EF >55, triv-mild pan regur, mild AS, G2DD; b.)TTE 09/02/16: EF >55, triv TR, mild AS; c.)TTE 11/05/16: EF >55, LAE, triv-mild pan regur, mild AS, G2DD; d.)TTE 01/21/20: EF >55, mod LVH, LAE/RVE, triv-mild AR/PR/TR, mod MR, mild AS, G2DD; e.)TTE 09/24/22: EF>55, sev LVH, sev LAE, RAE, mild PR, mod AR/MR/TR, mild-mod AS, RVSP 74.7; f.)TTE 12/23/22: EF 60-65, LAE, mild-mod MR/TR, mild AS   Abnormal urine 05/01/2023   Anemia    Aortic atherosclerosis    Aortic stenosis 07/25/2016   a.) TTE 07/25/2016: mild (MPG 13.5); b.) TTE 09/02/2016: mild (MPG 16); c.) TTE 11/05/2016: mild (MPG 9); d.) TTE 01/21/2020: mild (MPG 11.7); e.) TTE 09/24/2022: mild-mod (MPG 20.3); f.) TTE 12/23/2022: mild (MPG 15)   Atrial fibrillation (HCC)    a.) CHA2DS2VASc = 6 (age x2, CHF, HTN, vascular disease history, T2DM);  b.) rate/rhythm maintained on oral labetalol ; chronically anticoagulated with apixaban  + clopidogrel    AV block, 1st degree    B12 deficiency    CKD (chronic kidney disease), stage III (HCC)    Colon polyps    Congestive heart failure (HCC) 05/01/2023   Coronary artery disease 08/16/2016   a.) s/p 3v CABG 08/30/2016; b.) s/p PCI 11/26/2016 (DES x 1 oSVG-RCA); c.) s/p PCI 12/17/2016 (DES x 5 --> mLCx x2, dLCx, pLAD, dLAD); c.) s/p PCI 06/09/2020 (DES x1 --> ISR oSVG-PDA)   Cyst of kidney, acquired 07/03/2023   Dyspnea    Gastroesophageal reflux disease 05/01/2023   GERD (gastroesophageal reflux disease)    Heart murmur     Hepatic steatosis    History of 2019 novel coronavirus disease (COVID-19) 09/02/2020   a.) Tx'd with remdesivir    History of blood transfusion    History of GI bleed 09/02/2020   a.) admitted to [facility] 09/02/2020 - 09/08/2020   History of heart artery stent 11/26/2016   TOTAL of 7 stents: a.) 11/26/2016 --> 3.5 x 22 mm Resolute Integrity oSVG-RCA; b.) 12/17/2016: 3.0 x 12 mm Xience Alpine dLCx, 3.5 x 22 mm Resolute Onyx mLCx, 3.5 x 18 mm Resolute Onyx mLCx, 3.0 x 26 mm Resolute Onyx pLAD, 2.0 x 26 mm Resolute Onyx dLAD; c.) 06/09/2020 --> 2.5 x 18 mm Resolute Onyx ISR oSVG-PDA   History of partial colectomy 08/12/2012   a.) large gastric hyperplastic polyp removal   HLD (hyperlipidemia)    Hypertension    LBBB (left bundle branch block)    Long term current use of anticoagulant    a.) apixaban    Long term current use of antithrombotics/antiplatelets    a.) clopidogrel    Male hypogonadism    a.) on exogenous TRT (1.62% gel)   Mild cardiomegaly    Mild gynecomastia (bilateral)    Nephrolithiasis    OSA (obstructive sleep apnea)    a.) does not utilize nocturnal PAP therapy   Personal history of surgery to heart and great vessels, presenting hazards to health 05/01/2023   Proteinuria, unspecified 08/01/2023   Pulmonary hypertension (HCC) 09/24/2022   a.) TTE 09/24/2022: RVSP 74.7; b.) TTE 12/23/2022: RVSP 31.3  RBBB (right bundle branch block with left anterior fascicular block)    Right inguinal hernia    Right thyroid  nodule    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   a.) LIMA-LAD, SVG-PDA, SVG-OM3   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/01/2023   Type 2 diabetes mellitus treated with insulin  (HCC)    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   Umbilical hernia    Unstable angina (HCC)    Vitamin D  deficiency     Past Surgical History:  Procedure Laterality Date   ANOMALOUS PULMONARY VENOUS RETURN REPAIR, TOTAL  2025   APPENDECTOMY     CARDIAC  CATHETERIZATION Left 08/16/2016   Procedure: Left Heart Cath and Coronary Angiography;  Surgeon: Cara JONETTA Lovelace, MD;  Location: [facility] INVASIVE CV LAB;  Service: Cardiovascular;  Laterality: Left;   COLONOSCOPY N/A 09/07/2020   Procedure: COLONOSCOPY;  Surgeon: Janalyn Keene NOVAK, MD;  Location: [facility] ENDOSCOPY;  Service: Endoscopy;  Laterality: N/A;   COLONOSCOPY N/A 10/19/2023   Procedure: COLONOSCOPY;  Surgeon: Jinny Carmine, MD;  Location: [facility] ENDOSCOPY;  Service: Endoscopy;  Laterality: N/A;   COLONOSCOPY WITH PROPOFOL  N/A 05/17/2021   Procedure: COLONOSCOPY WITH PROPOFOL ;  Surgeon: Janalyn Keene NOVAK, MD;  Location: [facility] ENDOSCOPY;  Service: Endoscopy;  Laterality: N/A;   CORONARY ANGIOPLASTY WITH STENT PLACEMENT Left 11/26/2016   Procedure: CORONARY ANGIOPLASTY WITH STENT PLACEMENT; Location: [facility]; Surgeon: Alm Dais, MD   CORONARY ARTERY BYPASS GRAFT N/A 08/30/2016   Procedure: CORONARY ARTERY BYPASS GRAFT; Location: [facility]; Surgeon: Juliene Pouch, MD   CORONARY STENT INTERVENTION N/A 06/09/2020   Procedure: CORONARY STENT INTERVENTION;  Surgeon: Lovelace Cara JONETTA, MD;  Location: [facility] INVASIVE CV LAB;  Service: Cardiovascular;  Laterality: N/A;   CORONARY STENT INTERVENTION Left 12/17/2016   Procedure: CORONARY STENT INTERVENTION; Location: [facility]; Surgeon: Alm Dais, MD   CORONARY STENT INTERVENTION  03/2024   ENDOSCOPIC MUCOSAL RESECTION N/A 09/22/2020   Procedure: ENDOSCOPIC MUCOSAL RESECTION;  Surgeon: [name] Aloha Raddle., MD;  Location: [facility] ENDOSCOPY;  Service: Gastroenterology;  Laterality: N/A;   ESOPHAGOGASTRODUODENOSCOPY N/A 09/07/2020   Procedure: ESOPHAGOGASTRODUODENOSCOPY (EGD);  Surgeon: Janalyn Keene NOVAK, MD;  Location: [facility] ENDOSCOPY;  Service: Endoscopy;  Laterality: N/A;   ESOPHAGOGASTRODUODENOSCOPY N/A 10/19/2023   Procedure: EGD (ESOPHAGOGASTRODUODENOSCOPY);  Surgeon: Jinny Carmine, MD;  Location: [facility] ENDOSCOPY;  Service: Endoscopy;  Laterality: N/A;    ESOPHAGOGASTRODUODENOSCOPY (EGD) WITH PROPOFOL  N/A 09/22/2020   Procedure: ESOPHAGOGASTRODUODENOSCOPY (EGD) WITH PROPOFOL ;  Surgeon: [name] Aloha Raddle., MD;  Location: [facility] ENDOSCOPY;  Service: Gastroenterology;  Laterality: N/A;   ESOPHAGOGASTRODUODENOSCOPY (EGD) WITH PROPOFOL  N/A 08/26/2023   Procedure: ESOPHAGOGASTRODUODENOSCOPY (EGD) WITH PROPOFOL ;  Surgeon: Jinny Carmine, MD;  Location: [facility] ENDOSCOPY;  Service: Endoscopy;  Laterality: N/A;   FRACTURE SURGERY     HEMOSTASIS CLIP PLACEMENT  09/22/2020   Procedure: HEMOSTASIS CLIP PLACEMENT;  Surgeon: [name] Aloha Raddle., MD;  Location: [facility] ENDOSCOPY;  Service: Gastroenterology;;   HEMOSTASIS CONTROL  09/22/2020   Procedure: HEMOSTASIS CONTROL;  Surgeon: [name] Aloha Raddle., MD;  Location: [facility] ENDOSCOPY;  Service: Gastroenterology;;   INSERTION OF MESH  02/19/2023   Procedure: INSERTION OF MESH;  Surgeon: Desiderio Schanz, MD;  Location: [facility] ORS;  Service: General;;  umbilical   LAPAROSCOPIC PARTIAL RIGHT COLECTOMY Right 08/12/2012   Procedure: LAPAROSCOPIC PARTIAL RIGHT COLECTOMY; Location: [facility]; Surgeon: Unknown Sharps, MD   LEFT HEART CATH AND CORONARY ANGIOGRAPHY Left 06/09/2020   Procedure: LEFT HEART CATH AND CORONARY ANGIOGRAPHY;  Surgeon: Lovelace Cara JONETTA, MD;  Location: [facility] INVASIVE CV  LAB;  Service: Cardiovascular;  Laterality: Left;   LEFT HEART CATH AND CORS/GRAFTS ANGIOGRAPHY Left 11/20/2016   Procedure: Left Heart Cath and Cors/Grafts Angiography;  Surgeon: Cara JONETTA Lovelace, MD;  Location: [facility] INVASIVE CV LAB;  Service: Cardiovascular;  Laterality: Left;   POLYPECTOMY  09/22/2020   Procedure: POLYPECTOMY;  Surgeon: [name] Aloha Raddle., MD;  Location: [facility] ENDOSCOPY;  Service: Gastroenterology;;   POLYPECTOMY  10/19/2023   Procedure: POLYPECTOMY;  Surgeon: Jinny Carmine, MD;  Location: [facility] ENDOSCOPY;  Service: Endoscopy;;   RIGHT HEART CATH Right 07/01/2024   Procedure: RIGHT HEART CATH;  Surgeon: Ammon Blunt, MD;  Location: [facility] INVASIVE CV LAB;  Service: Cardiovascular;  Laterality: Right;   SUBMUCOSAL LIFTING INJECTION  09/22/2020   Procedure: SUBMUCOSAL LIFTING INJECTION;  Surgeon: [name] Aloha Raddle., MD;  Location: [facility] ENDOSCOPY;  Service: Gastroenterology;;   UMBILICAL HERNIA REPAIR N/A 02/19/2023   Procedure: HERNIA REPAIR UMBILICAL ADULT, open;  Surgeon: Desiderio Schanz, MD;  Location: [facility] ORS;  Service: General;  Laterality: N/A;   XI ROBOTIC ASSISTED INGUINAL HERNIA REPAIR WITH MESH Right 02/19/2023   Procedure: XI ROBOTIC ASSISTED INGUINAL HERNIA REPAIR WITH MESH;  Surgeon: Desiderio Schanz, MD;  Location: [facility] ORS;  Service: General;  Laterality: Right;    Medications Prior to Admission  Medication Sig Dispense Refill Last Dose/Taking   amLODipine  (NORVASC ) 10 MG tablet Take 10 mg by mouth daily.   08/09/2024   aspirin  EC 81 MG tablet Take 81 mg by mouth daily.   08/09/2024   atorvastatin  (LIPITOR ) 80 MG tablet Take 80 mg by mouth.  Take 80 mg by mouth in the morning.   08/09/2024   bisacodyl  (DULCOLAX) 5 MG EC tablet Take 5 mg by mouth as needed for moderate constipation.   Unknown   carvedilol  (COREG ) 3.125 MG tablet Take 1 tablet (3.125 mg total) by mouth 2 (two) times daily with a meal. 60 tablet 1 08/09/2024   cyanocobalamin  (VITAMIN B12) 1000 MCG tablet Take 1,000 mcg by mouth daily.   08/09/2024   dexlansoprazole (DEXILANT) 60 MG capsule Take 1 capsule by mouth daily.   08/09/2024   empagliflozin  (JARDIANCE ) 10 MG TABS tablet Take 1 tablet (10 mg total) by mouth daily before breakfast. 30 tablet 0 08/09/2024   Ferrous Sulfate  (IRON ) 325 (65 Fe) MG TABS Take 1 tablet by mouth daily.   08/09/2024   isosorbide  mononitrate (IMDUR ) 120 MG 24 hr tablet Take 120 mg by mouth daily.   08/09/2024   lactulose  (CHRONULAC ) 10 GM/15ML solution Taking 15-20 cc by mouth 3 to 4 times a day for constiption (Patient taking differently: Taking 15-20 cc by mouth 3 to 4 times a day for  constiption as needed) 946 mL 2 08/09/2024   nitroGLYCERIN  (NITROSTAT ) 0.4 MG SL tablet Place 0.4 mg under the tongue every 5 (five) minutes as needed for chest pain.   Unknown   torsemide  (DEMADEX ) 20 MG tablet Take 2 tablets (40 mg total) by mouth daily. 60 tablet 0 Unknown   clopidogrel  (PLAVIX ) 75 MG tablet Take 75 mg by mouth daily. (Patient not taking: Reported on 08/10/2024)   Not Taking   Social History   Socioeconomic History   Marital status: Married    Spouse name: Not on file   Number of children: Not on file   Years of education: Not on file   Highest education level: Not on file  Occupational History   Not on file  Tobacco Use   Smoking status: Former    Current  packs/day: 0.00    Types: Cigarettes    Quit date: 2000    Years since quitting: 26.0    Passive exposure: Never   Smokeless tobacco: Never  Vaping Use   Vaping status: Never Used  Substance and Sexual Activity   Alcohol use: No   Drug use: No   Sexual activity: Not Currently  Other Topics Concern   Not on file  Social History Narrative   Not on file   Social Drivers of Health   Tobacco Use: Medium Risk (08/10/2024)   Patient History    Smoking Tobacco Use: Former    Smokeless Tobacco Use: Never    Passive Exposure: Never  Physicist, Medical Strain: Low Risk  (05/08/2024)   Received from [facility]   Overall Financial Resource Strain (CARDIA)    Difficulty of Paying Living Expenses: Not hard at all  Food Insecurity: No Food Insecurity (08/11/2024)   Epic    Worried About Radiation Protection Practitioner of Food in the Last Year: Never true    Ran Out of Food in the Last Year: Never true  Transportation Needs: No Transportation Needs (08/11/2024)   Epic    Lack of Transportation (Medical): No    Lack of Transportation (Non-Medical): No  Physical Activity: Not on file  Stress: Not on file  Social Connections: Unknown (08/11/2024)   Social Connection and Isolation Panel    Frequency of  Communication with Friends and Family: More than three times a week    Frequency of Social Gatherings with Friends and Family: More than three times a week    Attends Religious Services: Not on Marketing Executive or Organizations: Not on file    Attends Banker Meetings: Not on file    Marital Status: Not on file  Recent Concern: Social Connections - Moderately Isolated (07/18/2024)   Social Connection and Isolation Panel    Frequency of Communication with Friends and Family: More than three times a week    Frequency of Social Gatherings with Friends and Family: More than three times a week    Attends Religious Services: Never    Database Administrator or Organizations: No    Attends Banker Meetings: Never    Marital Status: Married  Catering Manager Violence: Unknown (08/11/2024)   Epic    Fear of Current or Ex-Partner: No    Emotionally Abused: No    Physically Abused: Not on file    Sexually Abused: Not on file  Depression (PHQ2-9): Low Risk (07/16/2024)   Depression (PHQ2-9)    PHQ-2 Score: 0  Alcohol Screen: Not on file  Housing: Unknown (08/11/2024)   Epic    Unable to Pay for Housing in the Last Year: No    Number of Times Moved in the Last Year: 0    Homeless in the Last Year: Not on file  Utilities: Not At Risk (08/11/2024)   Epic    Threatened with loss of utilities: No  Health Literacy: Not on file    Family History  Problem Relation Age of Onset   Hyperlipidemia Mother    Heart disease Mother    Hypertension Father       Review of systems complete and found to be negative unless listed above      PHYSICAL EXAM  Alert and oriented x 3 +1 pedal edema Grade 3 systolic murmur aortic and mitral area Decreased basilar breath  Labs:   Lab Results  Component Value Date   WBC 5.9 08/13/2024   HGB 8.0 (L) 08/13/2024   HCT 24.6 (L) 08/13/2024   MCV 101.8 (H) 08/13/2024   PLT 94 (L) 08/13/2024    Recent Labs  Lab  08/10/24 0732 08/11/24 0521 08/14/24 0448  NA 141   < > 139  K 4.7   < > 4.6  CL 102   < > 99  CO2 28   < > 34*  BUN 37*   < > 46*  CREATININE 1.67*   < > 1.95*  CALCIUM  11.1*   < > 10.1  PROT 8.1  --   --   BILITOT 1.9*  --   --   ALKPHOS 129*  --   --   ALT 12  --   --   AST 45*  --   --   GLUCOSE 168*   < > 168*   < > = values in this interval not displayed.   No results found for: CKTOTAL, CKMB, CKMBINDEX, TROPONINI  Lab Results  Component Value Date   CHOL 85 12/21/2022   Lab Results  Component Value Date   HDL 45 12/21/2022   Lab Results  Component Value Date   LDLCALC 29 12/21/2022   Lab Results  Component Value Date   TRIG 53 12/21/2022   Lab Results  Component Value Date   CHOLHDL 1.9 12/21/2022   No results found for: LDLDIRECT    Radiology: [facility] Chest Port 1 View Result Date: 08/10/2024 EXAM: 1 VIEW(S) XRAY OF THE CHEST 08/10/2024 04:14:18 PM COMPARISON: 08/10/2024 CLINICAL HISTORY: S/P thoracentesis FINDINGS: LINES, TUBES AND DEVICES: TAVR in place. CABG markers noted. LUNGS AND PLEURA: Mild pulmonary edema, unchanged. Stable small to moderate bilateral pleural effusions. Unchanged bibasilar opacities. No pneumothorax. HEART AND MEDIASTINUM: Stable cardiomegaly. Aortic atherosclerosis. BONES AND SOFT TISSUES: Median sternotomy wires noted. No acute osseous abnormality. IMPRESSION: 1. Mild pulmonary edema, unchanged. 2. Stable small to moderate bilateral pleural effusions. Electronically signed by: Greig Pique MD 08/10/2024 06:13 PM EST RP Workstation: [id number]   US  THORACENTESIS ASP PLEURAL SPACE W/IMG GUIDE Result Date: 08/10/2024 INDICATION: 79 year old male with an extensive cardiac history who presented to the ED today with worsening exertional chest pain and dyspnea on exertion. Workup consistent with heart failure exacerbation and imaging concerning for moderate bilateral pleural effusions with mild pulmonary edema and bibasilar  atelectasis. Request for diagnostic and therapeutic thoracentesis. EXAM: ULTRASOUND GUIDED DIAGNOSTIC AND THERAPEUTIC, LEFT-SIDED THORACENTESIS MEDICATIONS: 1% lidocaine , 6 mL. COMPLICATIONS: None immediate. PROCEDURE: An ultrasound guided thoracentesis was thoroughly discussed with the patient and questions answered. The benefits, risks, alternatives and complications were also discussed. The patient understands and wishes to proceed with the procedure. Written consent was obtained. Ultrasound was performed to localize and mark an adequate pocket of fluid in the left chest. The area was then prepped and draped in the normal sterile fashion. 1% Lidocaine  was used for local anesthesia. Under ultrasound guidance a 6 Fr Safe-T-Centesis catheter was introduced. Thoracentesis was performed. The catheter was removed and a dressing applied. FINDINGS: A total of approximately 700 mL of serous sanguinous fluid was removed. Samples were sent to the laboratory as requested by the clinical team. IMPRESSION: Successful ultrasound guided left-sided thoracentesis yielding 700 mL of pleural fluid. Procedure performed by: Sherrilee Bal, PA-C under the supervision of Dr. VEAR Lent Electronically Signed   By: Wilkie Lent M.D.   On: 08/10/2024 16:38   DG Chest 2 View Result Date: 08/10/2024 EXAM: 2 VIEW(S) XRAY  OF THE CHEST 08/10/2024 08:36:00 AM COMPARISON: 08/01/2024 CLINICAL HISTORY: dyspnea, cp FINDINGS: LUNGS AND PLEURA: Moderate bilateral pleural effusions. Mild pulmonary edema. Bibasilar atelectasis/airspace disease. No pneumothorax. HEART AND MEDIASTINUM: Stable CABG markers. TAVR noted. Aortic atherosclerosis. BONES AND SOFT TISSUES: Sternotomy wires noted. No acute osseous abnormality. IMPRESSION: 1. Moderate bilateral pleural effusions with mild pulmonary edema and bibasilar atelectasis/airspace disease. Electronically signed by: Waddell Calk MD 08/10/2024 08:41 AM EST RP Workstation: [id number]   DG Chest  1 View Result Date: 08/01/2024 EXAM: 1 VIEW(S) XRAY OF THE CHEST 08/01/2024 09:32:09 AM COMPARISON: 07/20/2024 CLINICAL HISTORY: SOB (shortness of breath) FINDINGS: LUNGS AND PLEURA: Bibasilar airspace opacities are stable. Mild pulmonary edema is present. Moderate right pleural effusion, slightly decreased. Similar small left pleural effusion. No pneumothorax. HEART AND MEDIASTINUM: Stable cardiomegaly. Aortic atherosclerotic calcification. Aortic valve replacement is noted. BONES AND SOFT TISSUES: Median sternotomy is noted. No acute osseous abnormality. IMPRESSION: 1. Moderate right pleural effusion, slightly decreased, and similar small left pleural effusion. 2. Stable bibasilar airspace opacities and mild pulmonary edema. 3. Stable cardiomegaly and aortic atherosclerotic calcification. 4. Postsurgical changes of median sternotomy and aortic valve replacement. Electronically signed by: Evalene Coho MD 08/01/2024 09:37 AM EST RP Workstation: [id number]   CT CHEST ABDOMEN PELVIS WO CONTRAST Result Date: 07/20/2024 EXAM: CT CHEST, ABDOMEN AND PELVIS WITHOUT CONTRAST 07/20/2024 06:52:00 PM TECHNIQUE: CT of the chest, abdomen and pelvis was performed without the administration of intravenous contrast. Multiplanar reformatted images are provided for review. Automated exposure control, iterative reconstruction, and/or weight based adjustment of the mA/kV was utilized to reduce the radiation dose to as low as reasonably achievable. COMPARISON: 08/20/2023 CLINICAL HISTORY: LDH elevation. Oncology wanted to see if cancerous process. Okay for oral contrast but not intravenous. FINDINGS: CHEST: MEDIASTINUM AND LYMPH NODES: Status post transcatheter aortic valve replacement and coronary artery bypass grafting. Mild cardiomegaly. No pericardial effusion. The central pulmonary arteries are enlarged in keeping with changes of pulmonary arterial hypertension. Mild atherosclerotic calcification within the thoracic aorta.  No aortic aneurysm. There is progressive pathologic mediastinal adenopathy. By example, previously measured left prevascular and precarinal lymph nodes now measure 12 mm (previously 10 mm) and 20 mm (previously 10 mm) in short axis diameter. The findings suggest either a chronic inflammatory or low-grade lymphoproliferative process. Stable 2.3 cm right thyroid  nodule, better assessed on thyroid  sonogram of 10/12/2020. No follow up imaging is recommended. LUNGS AND PLEURA: Small to moderate bilateral pleural effusions, right greater than left, appears similar to prior examination though there is new pleural thickening bilaterally as well as probable septation noted at the right basilar pleural space which is nonspecific in the setting of repeated thoracentesis. Small focus of pleural gas on the right may relate to recent thoracentesis. No focal consolidation or pulmonary edema. No pneumothorax. ABDOMEN AND PELVIS: LIVER: The liver is unremarkable. GALLBLADDER AND BILE DUCTS: Cholelithiasis without superimposed pericholecystic inflammatory change. No intra or extrahepatic biliary ductal dilation. SPLEEN: No acute abnormality. PANCREAS: No acute abnormality. ADRENAL GLANDS: No acute abnormality. KIDNEYS, URETERS AND BLADDER: Simple exophytic cortical cysts are seen within the kidneys bilaterally for which no follow up imaging is recommended. Of a hyperdense exophytic cortical cyst arises anteriorly but little lower pole of the right kidney but is unchanged from prior examination and was better characterized as a simple cyst on that exam. Per consensus, no follow-up is needed for simple Bosniak type 1 and 2 renal cysts, unless the patient has a malignancy history or risk factors. 7 mm nonobstructing calculus within the  lower pole of the left kidney. The kidneys are otherwise unremarkable. No stones in the ureters. No hydronephrosis. No perinephric or periureteral stranding. Urinary bladder is unremarkable. GI AND BOWEL:  Moderate stool burden within the distal colon and rectum without evidence of obstruction. The stomach, small bowel, and large bowel are otherwise unremarkable. Appendix absent. REPRODUCTIVE ORGANS: No acute abnormality. PERITONEUM AND RETROPERITONEUM: No ascites. No free air. VASCULATURE: Aorta is normal in caliber. Extensive aortic iliac atherosclerotic calcification. ABDOMINAL AND PELVIS LYMPH NODES: Pathologic aortic cable adenopathy with a single lymph node measuring 15 mm in short axis diameter is stable since prior examination, nonspecific. No additional pathologic adenopathy within the abdomen and pelvis. REPRODUCTIVE ORGANS: No acute abnormality. BONES AND SOFT TISSUES: Osseous structures are age appropriate. No acute bone abnormality. No lytic or blastic bone lesion. No focal soft tissue abnormality. IMPRESSION: 1. Progressive pathologic mediastinal adenopathy, suggestive of a chronic inflammatory or low-grade lymphoproliferative process. 2. Small to moderate bilateral pleural effusions, right greater than left, with new bilateral pleural thickening and probable right basilar pleural space septation; small right pleural gas likely post-thoracentesis. 3. Stable pathologic aortocaval adenopathy measuring 15 mm in short axis, nonspecific. 4. Stable 2.3 cm right thyroid  nodule; no follow-up imaging is recommended. 5. Cholelithiasis without acute inflammatory change and without biliary ductal dilation. 6. Moderate stool burden in the distal colon and rectum without obstruction. 7. RAF score: Aortic Atherosclerosis (ICD10-I70.0). Electronically signed by: Dorethia Molt MD 07/20/2024 08:42 PM EST RP Workstation: [id number]   DG Chest Port 1 View Result Date: 07/20/2024 CLINICAL DATA:  Status post right thoracentesis. EXAM: PORTABLE CHEST 1 VIEW COMPARISON:  07/18/2024 FINDINGS: Small to moderate-sized right pleural effusion, decreased in size. No pneumothorax. No significant change in right basilar atelectasis  and possible pneumonia. Stable small left pleural effusion. Increased linear and patchy density at the left lung base. Atheromatous aortic arch calcifications. Diffuse osteopenia. IMPRESSION: 1. Small to moderate-sized right pleural effusion, decreased in size following thoracentesis. 2. No pneumothorax. 3. No significant change in right basilar atelectasis and possible pneumonia. 4. Increased left basilar atelectasis and possible pneumonia. 5. Stable small left pleural effusion. Electronically Signed   By: Elspeth Bathe M.D.   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 Request for diagnostic and therapeutic thoracentesis. EXAM: ULTRASOUND GUIDED DIAGNOSTIC AND THERAPEUTIC, RIGHT-SIDED THORACENTESIS MEDICATIONS: 1% lidocaine , 8 mL. COMPLICATIONS: None immediate. PROCEDURE: An ultrasound guided thoracentesis was thoroughly discussed with the patient and questions answered. The benefits, risks, alternatives and complications were also discussed. The patient understands and wishes to proceed with the procedure. Written consent was obtained. Ultrasound was performed and was notable for several loculations throughout the right chest. An adequate pocket of fluid was identified in the right chest. The area was then prepped and draped in the normal sterile fashion. 1% Lidocaine  was used for local anesthesia. Under ultrasound guidance a 6 Fr Safe-T-Centesis catheter was introduced. Thoracentesis was performed. Unfortunately, due to the septations, only a small amount of fluid was able to be drained. Residual fluid noted to the right lateral chest on post procedure ultrasound. The catheter was removed and a dressing applied. FINDINGS: A total of approximately 450 mL of  amber fluid was removed. Samples were sent to the laboratory as requested by the clinical team. IMPRESSION: Successful ultrasound guided right-sided thoracentesis yielding  450 mL of pleural fluid. Procedure performed by: Sherrilee Bal, PA-C under the supervision of Dr. ONEIDA Specking. Electronically Signed   By: CHRISTELLA.  Shick M.D.   On: 07/20/2024 11:14   DG Chest 2 View Result Date: 07/18/2024 CLINICAL DATA:  Chest pain and shortness of breath EXAM: CHEST - 2 VIEW COMPARISON:  07/02/2024 FINDINGS: Frontal and lateral views of the chest demonstrate stable postsurgical changes from median sternotomy and aortic valve replacement. Cardiac silhouette is enlarged but stable. Persistent bibasilar veiling opacities consistent with consolidation and effusions, right greater than left. No pneumothorax. No acute bony abnormalities. IMPRESSION: 1. Stable bibasilar consolidation and effusions, right greater than left. 2. Stable enlarged cardiac silhouette. Electronically Signed   By: Ozell Daring M.D.   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  I believe increasing hemoglobin is also helping with his symptoms.  Creatinine uptrending Will change IV Lasix  to po torsemide  60 mg daily Continue GDMT with Jardiance , carvedilol . Continue aspirin , statin Continue amlodipine  and Imdur . Recommend goal hemoglobin greater than 8.  Signed: Keller JAYSON Paterson MD 08/14/2024, 11:26 AM

## 2024-08-14 NOTE — Progress Notes (Signed)
 "    Progress Note    [name]  [name] DOB: 1946-06-18  DOA: 08/10/2024 PCP: [name], MD      Brief Narrative:    Medical records reviewed and are as summarized below:  [name] is a 79 y.o. 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 Discharged on 07/23/24 after presenting with respiratory symptoms secondary to recurrent right pleural effusion as well as symptomatic anemia. He underwent a thoracentesis at that time and 450 cc of fluid was obtained. CT chest, abdomen and pelvis demonstrated progressive mediastinal adenopathy suggestive of either chronic inflammatory process or low-grade lymphoproliferative process. Subsequent cytology was negative for any malignancy. He was discharged home on base level 4 L O2. Because of the severity of pulmonary hypertension Watchman procedure has been delayed according to the patient. He is currently not on anticoagulation due to recurrent GI bleeding and symptomatic anemia requiring transfusions. His anemia is multifactorial secondary to iron  deficiency with chronic blood loss as well as RBC destruction in setting of TAVR.   He presented to the ED again  on 08/10/2024 with complaints of exertional shortness of breath and chest pain that had been going on for over a week.  Initially complained of increasing lower extremity edema worse than baseline.   proBNP 15,340  Chest x-ray IMPRESSION: 1. Moderate bilateral pleural effusions with mild pulmonary edema and bibasilar atelectasis/airspace disease.   Assessment/Plan:    Principal Problem:   Acute heart failure with preserved ejection fraction (HFpEF) (HCC) Active Problems:   Acute heart failure with preserved ejection fraction (HCC)    Body mass index is 25.86 kg/m.   Acute on chronic HFpEF cardiology, moderate to severe mitral regurgitation: Improved.  IV Lasix  has been switched to torsemide . Monitor daily weight, urine output and BMP.  Follow-up with cardiologist. 2D echo in July 2025 showed EF estimated at 66 5%, moderate to severe MR, mild to moderate TR   Moderate bilateral pleural effusion: S/p left thoracentesis with removal of 700 mL of pleural fluid on 08/10/2024   Severe pulmonary hypertension: Follow-up with pulmonologist.     Chronic hypoxic respiratory failure: Uses 2 L/min oxygen as needed at home.  However, he said he has been using it regularly of late.   Elevated troponins: This is likely from demand ischemia.   Permanent atrial fibrillation: Not on anticoagulation because of chronic anemia and history of GI bleed    Acute on chronic anemia: Hemoglobin up from 7.1-8 0.  Monitor H&H and transfuse as needed. S/p transfusion of 1 unit of PRBCs on 08/13/2024. S/p transfusion 1 unit of PRBCs on 08/12/2024. Patient said he has had multiple transfusion in the past and he is okay with blood transfusion.   CKD stage IIIb: Creatinine is stable    Comorbidities include CAD s/p CABG x 3, s/p coronary stent x 2, hypertension, hyperlipidemia, OSA, CPAP intolerant)   Diet Order             Diet Heart Room service appropriate? Yes; Fluid consistency: Thin  Diet effective now  Consultants: Cardiologist Pulmonologist Interventional radiologist  Procedures: Left thoracentesis on 08/10/2024    Medications:    sodium chloride    Intravenous Once   amLODipine   10 mg Oral Daily   aspirin  EC  81 mg Oral Daily   atorvastatin   80 mg Oral Daily   carvedilol   3.125 mg Oral  BID WC   cyanocobalamin   1,000 mcg Oral Daily   empagliflozin   10 mg Oral QAC breakfast   ferrous sulfate   325 mg Oral Daily   heparin   5,000 Units Subcutaneous Q8H   isosorbide  mononitrate  120 mg Oral Daily   pantoprazole   40 mg Oral Daily   polyethylene glycol  17 g Oral Daily   sodium chloride  flush  3 mL Intravenous Q12H   [START ON 08/15/2024] torsemide   60 mg Oral Daily   Continuous Infusions:   Anti-infectives (From admission, onward)    None              Family Communication/Anticipated D/C date and plan/Code Status   DVT prophylaxis: heparin  injection 5,000 Units Start: 08/10/24 2200     Code Status: Full Code  Family Communication: None Disposition Plan: Plan to discharge home   Status is: Inpatient Remains inpatient appropriate because: CHF exacerbation       Subjective:   Interval events noted.  No new complaints.  He feels better today.  Shortness of breath and chest pain have improved.  Objective:    Vitals:   08/14/24 0442 08/14/24 0500 08/14/24 0824 08/14/24 1147  BP: (!) 140/64  (!) 156/71 (!) 153/65  Pulse: (!) 55  66 60  Resp: 20     Temp: (!) 97.5 F (36.4 C)  (!) 97.4 F (36.3 C) 98.4 F (36.9 C)  TempSrc:    Oral  SpO2: 100%  93% 90%  Weight:  87.7 kg    Height:       No data found.   Intake/Output Summary (Last 24 hours) at 08/14/2024 1551 Last data filed at 08/14/2024 1458 Gross per 24 hour  Intake 603 ml  Output 3580 ml  Net -2977 ml   Filed Weights   08/12/24 0500 08/13/24 0500 08/14/24 0500  Weight: 86.1 kg 88 kg 87.7 kg    Exam:   GEN: NAD SKIN: Warm and dry EYES: No pallor or icterus ENT: MMM CV: RRR PULM: CTA B ABD: soft, ND, NT, +BS CNS: AAO x 3, non focal EXT: Bilateral leg and pedal edema       Data Reviewed:   I have personally reviewed following labs and imaging studies:  Labs: Labs show the following:   Basic Metabolic Panel: Recent Labs  Lab 08/10/24 0732 08/11/24 0521  08/12/24 0408 08/13/24 0359 08/14/24 0448  NA 141 141 139 137 139  K 4.7 4.5 4.3 4.3 4.6  CL 102 102 100 99 99  CO2 28 31 30 31  34*  GLUCOSE 168* 152* 153* 155* 168*  BUN 37* 38* 42* 43* 46*  CREATININE 1.67* 1.74* 1.78* 1.85* 1.95*  CALCIUM  11.1* 10.2 10.0 10.2 10.1  MG  --   --   --  2.2 2.1  PHOS  --   --   --   --  3.3   GFR Estimated Creatinine Clearance: 35.9 mL/min (A) (by C-G formula based on SCr of 1.95 mg/dL (H)). Liver Function Tests: Recent Labs  Lab 08/10/24 0732 08/14/24 0448  AST 45*  --   ALT 12  --   ALKPHOS 129*  --  BILITOT 1.9*  --   PROT 8.1  --   ALBUMIN 4.2 3.5   No results for input(s): LIPASE, AMYLASE in the last 168 hours. No results for input(s): AMMONIA in the last 168 hours. Coagulation profile No results for input(s): INR, PROTIME in the last 168 hours.  CBC: Recent Labs  Lab 08/10/24 0732 08/12/24 0408 08/13/24 0359 08/13/24 1417  WBC 8.9 5.7 5.9  --   HGB 8.0* 7.1* 7.1* 8.0*  HCT 26.1* 22.3* 22.5* 24.6*  MCV 102.8* 101.8* 101.8*  --   PLT 159 111* 94*  --    Cardiac Enzymes: No results for input(s): CKTOTAL, CKMB, CKMBINDEX, TROPONINI in the last 168 hours. BNP (last 3 results) Recent Labs    07/01/24 1220 07/18/24 1707 08/10/24 0732  PROBNP 12,167.0* 13,853.0* 15,340.0*   CBG: No results for input(s): GLUCAP in the last 168 hours. D-Dimer: No results for input(s): DDIMER in the last 72 hours. Hgb A1c: No results for input(s): HGBA1C in the last 72 hours. Lipid Profile: No results for input(s): CHOL, HDL, LDLCALC, TRIG, CHOLHDL, LDLDIRECT in the last 72 hours. Thyroid  function studies: No results for input(s): TSH, T4TOTAL, T3FREE, THYROIDAB in the last 72 hours.  Invalid input(s): FREET3 Anemia work up: No results for input(s): VITAMINB12, FOLATE, FERRITIN, TIBC, IRON , RETICCTPCT in the last 72 hours. Sepsis Labs: Recent Labs  Lab 08/10/24 0732  08/12/24 0408 08/13/24 0359  WBC 8.9 5.7 5.9    Microbiology No results found for this or any previous visit (from the past 240 hours).  Procedures and diagnostic studies:  No results found.              LOS: 3 days   Yarden Hillis  [facility]   Pager on [email or web address]. If 7PM-7AM, please contact night-coverage at [email or web address]     08/14/2024, 3:51 PM           "

## 2024-08-14 NOTE — Progress Notes (Signed)
 PT Cancellation Note  Patient Details Name: MATEJ SAPPENFIELD MRN: [id number] DOB: [birth year]   Cancelled Treatment:    Reason Eval/Treat Not Completed: Patient declined to participate with PT services this date due to fatigue.  Will attempt to see pt at a future date/time as medically appropriate.    CHARM Glendia Bertin PT, DPT 08/14/2024, 4:25 PM

## 2024-08-15 DIAGNOSIS — I5031 Acute diastolic (congestive) heart failure: Secondary | ICD-10-CM | POA: Diagnosis not present

## 2024-08-15 LAB — BASIC METABOLIC PANEL WITH GFR
Anion gap: 7 (ref 5–15)
BUN: 46 mg/dL — ABNORMAL HIGH (ref 8–23)
CO2: 34 mmol/L — ABNORMAL HIGH (ref 22–32)
Calcium: 10.3 mg/dL (ref 8.9–10.3)
Chloride: 99 mmol/L (ref 98–111)
Creatinine, Ser: 1.87 mg/dL — ABNORMAL HIGH (ref 0.61–1.24)
GFR, Estimated: 37 mL/min — ABNORMAL LOW
Glucose, Bld: 153 mg/dL — ABNORMAL HIGH (ref 70–99)
Potassium: 3.9 mmol/L (ref 3.5–5.1)
Sodium: 139 mmol/L (ref 135–145)

## 2024-08-15 LAB — CBC
HCT: 25.6 % — ABNORMAL LOW (ref 39.0–52.0)
Hemoglobin: 8.1 g/dL — ABNORMAL LOW (ref 13.0–17.0)
MCH: 32 pg (ref 26.0–34.0)
MCHC: 31.6 g/dL (ref 30.0–36.0)
MCV: 101.2 fL — ABNORMAL HIGH (ref 80.0–100.0)
Platelets: 112 K/uL — ABNORMAL LOW (ref 150–400)
RBC: 2.53 MIL/uL — ABNORMAL LOW (ref 4.22–5.81)
RDW: 19 % — ABNORMAL HIGH (ref 11.5–15.5)
WBC: 11.3 K/uL — ABNORMAL HIGH (ref 4.0–10.5)
nRBC: 0 % (ref 0.0–0.2)

## 2024-08-15 LAB — TYPE AND SCREEN
ABO/RH(D): A POS
Antibody Screen: NEGATIVE

## 2024-08-15 LAB — ACID FAST CULTURE WITH REFLEXED SENSITIVITIES (MYCOBACTERIA): Acid Fast Culture: NEGATIVE

## 2024-08-15 MED ORDER — TORSEMIDE 20 MG PO TABS: 60.0000 mg | ORAL_TABLET | Freq: Every day | ORAL | Status: DC

## 2024-08-15 NOTE — Plan of Care (Signed)

## 2024-08-15 NOTE — Progress Notes (Signed)
 PT Cancellation Note  Patient Details Name: [name] MRN: [id number] DOB: 1945/12/01   Cancelled Treatment:    Reason Eval/Treat Not Completed: Patient declined, no reason specified Pt decline PT services. Pt states I'm getting ready to go home. I don't need PT . [name] I feel as good as I did before. Patient endorses having support at home needed for safe d/c home with additional equipment need to mobilize safety.    [name] [name] 08/15/2024, 9:06 AM

## 2024-08-15 NOTE — Discharge Summary (Signed)
 " Physician Discharge Summary   Patient: [name] MRN: [id number] DOB: Jan 13, 1946  Admit date:     08/10/2024  Discharge date: 08/15/2024  Discharge Physician: AIDA CHO   PCP: [name], MD   Recommendations at discharge:   Follow-up with PCP in 1 week Follow-up with Dr. Melanee, hematologist, in 1 week Follow-up with cardiologist (office will schedule appointment)  Discharge Diagnoses: Principal Problem:   Acute heart failure with preserved ejection fraction (HFpEF) (HCC) Active Problems:   Acute heart failure with preserved ejection fraction (HCC)  Resolved Problems:   * No resolved hospital problems. [facility] Course:  [name] is a 79 y.o. 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 Discharged on 07/23/24 after presenting with respiratory symptoms secondary to recurrent right pleural effusion as well as symptomatic anemia. He underwent a thoracentesis at that time and 450 cc of fluid was obtained. CT chest, abdomen and pelvis demonstrated progressive mediastinal adenopathy suggestive of either chronic inflammatory process or low-grade lymphoproliferative process. Subsequent cytology was negative for any malignancy. He was discharged home on base level 4 L O2. Because of the severity of pulmonary hypertension Watchman procedure has been delayed according to the patient. He is currently not on anticoagulation due to recurrent GI bleeding and symptomatic anemia requiring transfusions. His anemia is multifactorial secondary to iron  deficiency with chronic blood loss as  well as RBC destruction in setting of TAVR.    He presented to the ED again  on 08/10/2024 with complaints of exertional shortness of breath and chest pain that had been going on for over a week.  Initially complained of increasing lower extremity edema worse than baseline.    Assessment and Plan:  Acute on chronic HFpEF cardiology, moderate to severe mitral regurgitation: Improved.  Continue torsemide  at discharge.  Home dose increased from 40 mg to 60 mg daily. 2D echo in July 2025 showed EF estimated at 66 5%, moderate to severe MR, mild to moderate TR Outpatient follow-up with cardiologist.    Moderate bilateral pleural effusion: S/p left thoracentesis with removal of 700 mL of pleural fluid on 08/10/2024     Severe pulmonary hypertension: Outpatient follow-up with pulmonologist.     Chronic hypoxic respiratory failure: Uses 2 L/min oxygen as needed at home.  However, he said he has been using it regularly of late.  Continue 2 L oxygen as needed.     Elevated troponins: This is likely from demand ischemia.     Permanent atrial fibrillation: Not on anticoagulation because of chronic anemia and history of GI bleed      Acute on chronic anemia: Hemoglobin stable at 8.1.   S/p transfusion of 1 unit of PRBCs on 08/13/2024. S/p transfusion 1 unit of PRBCs on 08/12/2024. Outpatient follow-up with Dr. Melanee, hematologist, for ongoing management of chronic anemia     CKD stage IIIb: Creatinine is stable     Comorbidities include CAD s/p CABG x 3, s/p coronary stent x 2, hypertension, hyperlipidemia, OSA, CPAP intolerant)    His condition has improved and he is deemed stable for discharge to home today.  He is agreeable to discharge plan.  He understands that he is at high risk for readmission given underlying heart, lung and hematologic issues.    Case discussed with Dr. Wilburn, cardiologist.        Consultants: Cardiologist, pulmonologist, interventional  radiologist Procedures performed: Left thoracentesis on 08/10/2024 Disposition: Home Diet recommendation:  Cardiac diet DISCHARGE MEDICATION: Allergies as of 08/15/2024   No Known Allergies      Medication List     STOP taking these medications    clopidogrel  75 MG tablet Commonly known as: PLAVIX        TAKE these medications    amLODipine  10 MG tablet Commonly known as: NORVASC  Take 10 mg by mouth daily.   aspirin  EC 81 MG tablet Take 81 mg by mouth daily.   atorvastatin  80 MG tablet Commonly known as: LIPITOR  Take 80 mg by mouth.  Take 80 mg by mouth in the morning.   bisacodyl  5 MG EC tablet Commonly known as: DULCOLAX Take 5 mg by mouth as needed for moderate constipation.   carvedilol  3.125 MG tablet Commonly known as: COREG  Take 1 tablet (3.125 mg total) by mouth 2 (two) times daily with a meal.   cyanocobalamin  1000 MCG tablet Commonly known as: VITAMIN B12 Take 1,000 mcg by mouth daily.   dexlansoprazole 60 MG capsule Commonly known as: DEXILANT Take 1 capsule by mouth daily.   Iron  325 (65 Fe) MG Tabs Take 1 tablet by mouth daily.   isosorbide  mononitrate 120 MG 24 hr tablet Commonly known as: IMDUR  Take 120 mg by mouth daily.   Jardiance  10 MG Tabs tablet Generic drug: empagliflozin  Take 1 tablet (10 mg total) by mouth daily before breakfast.   lactulose  10 GM/15ML solution Commonly known as: CHRONULAC  Taking 15-20 cc by mouth 3 to 4 times a day for constiption What changed: additional instructions   nitroGLYCERIN  0.4 MG SL tablet Commonly known as: NITROSTAT  Place 0.4 mg under the tongue every 5 (five) minutes as needed for chest pain.   torsemide  20 MG tablet Commonly known as: DEMADEX  Take 3 tablets (60 mg total) by mouth daily. What changed: how much to take        Follow-up Information     Melanee Annah BROCKS, MD. Schedule an appointment as soon as possible for a visit in 1 week(s).   Specialty: Oncology Contact  information: [address] [place] [address] [phone]                Discharge Exam: Fredricka Weights   08/12/24 0500 08/13/24 0500 08/14/24 0500  Weight: 86.1 kg 88 kg 87.7 kg   GEN: NAD SKIN: Warm and dry EYES: No pallor or icterus ENT: MMM CV: Irregular rate and rhythm PULM:  no wheezing or rales heard ABD: soft, ND, NT, +BS CNS: AAO x 3, non focal EXT: Mild bilateral leg and pedal edema.  No erythema or tenderness   Condition at discharge: good  The results of significant diagnostics from this hospitalization (including imaging, microbiology, ancillary and laboratory) are listed below for reference.   Imaging Studies: DG Chest Port 1 View Result Date: 08/10/2024 EXAM: 1 VIEW(S) XRAY OF THE CHEST 08/10/2024 04:14:18 PM COMPARISON: 08/10/2024 CLINICAL HISTORY: S/P thoracentesis FINDINGS: LINES, TUBES AND DEVICES: TAVR in place. CABG markers noted. LUNGS AND PLEURA: Mild pulmonary edema, unchanged. Stable small to moderate bilateral pleural effusions. Unchanged bibasilar opacities. No pneumothorax. HEART AND MEDIASTINUM: Stable cardiomegaly. Aortic atherosclerosis. BONES AND SOFT TISSUES: Median sternotomy wires noted. No acute osseous abnormality. IMPRESSION: 1. Mild pulmonary  edema, unchanged. 2. Stable small to moderate bilateral pleural effusions. Electronically signed by: Greig Pique MD 08/10/2024 06:13 PM EST RP Workstation: [id number]   US  THORACENTESIS ASP PLEURAL SPACE W/IMG GUIDE Result Date: 08/10/2024 INDICATION: 79 year old male with an extensive cardiac history who presented to the ED today with worsening exertional chest pain and dyspnea on exertion. Workup consistent with heart failure exacerbation and imaging concerning for moderate bilateral pleural effusions with mild pulmonary edema and bibasilar atelectasis. Request for diagnostic and therapeutic thoracentesis. EXAM: ULTRASOUND GUIDED DIAGNOSTIC AND THERAPEUTIC, LEFT-SIDED THORACENTESIS  MEDICATIONS: 1% lidocaine , 6 mL. COMPLICATIONS: None immediate. PROCEDURE: An ultrasound guided thoracentesis was thoroughly discussed with the patient and questions answered. The benefits, risks, alternatives and complications were also discussed. The patient understands and wishes to proceed with the procedure. Written consent was obtained. Ultrasound was performed to localize and mark an adequate pocket of fluid in the left chest. The area was then prepped and draped in the normal sterile fashion. 1% Lidocaine  was used for local anesthesia. Under ultrasound guidance a 6 Fr Safe-T-Centesis catheter was introduced. Thoracentesis was performed. The catheter was removed and a dressing applied. FINDINGS: A total of approximately 700 mL of serous sanguinous fluid was removed. Samples were sent to the laboratory as requested by the clinical team. IMPRESSION: Successful ultrasound guided left-sided thoracentesis yielding 700 mL of pleural fluid. Procedure performed by: Sherrilee Bal, PA-C under the supervision of Dr. VEAR Lent Electronically Signed   By: Wilkie Lent M.D.   On: 08/10/2024 16:38   DG Chest 2 View Result Date: 08/10/2024 EXAM: 2 VIEW(S) XRAY OF THE CHEST 08/10/2024 08:36:00 AM COMPARISON: 08/01/2024 CLINICAL HISTORY: dyspnea, cp FINDINGS: LUNGS AND PLEURA: Moderate bilateral pleural effusions. Mild pulmonary edema. Bibasilar atelectasis/airspace disease. No pneumothorax. HEART AND MEDIASTINUM: Stable CABG markers. TAVR noted. Aortic atherosclerosis. BONES AND SOFT TISSUES: Sternotomy wires noted. No acute osseous abnormality. IMPRESSION: 1. Moderate bilateral pleural effusions with mild pulmonary edema and bibasilar atelectasis/airspace disease. Electronically signed by: Waddell Calk MD 08/10/2024 08:41 AM EST RP Workstation: [id number]   DG Chest 1 View Result Date: 08/01/2024 EXAM: 1 VIEW(S) XRAY OF THE CHEST 08/01/2024 09:32:09 AM COMPARISON: 07/20/2024 CLINICAL HISTORY: SOB  (shortness of breath) FINDINGS: LUNGS AND PLEURA: Bibasilar airspace opacities are stable. Mild pulmonary edema is present. Moderate right pleural effusion, slightly decreased. Similar small left pleural effusion. No pneumothorax. HEART AND MEDIASTINUM: Stable cardiomegaly. Aortic atherosclerotic calcification. Aortic valve replacement is noted. BONES AND SOFT TISSUES: Median sternotomy is noted. No acute osseous abnormality. IMPRESSION: 1. Moderate right pleural effusion, slightly decreased, and similar small left pleural effusion. 2. Stable bibasilar airspace opacities and mild pulmonary edema. 3. Stable cardiomegaly and aortic atherosclerotic calcification. 4. Postsurgical changes of median sternotomy and aortic valve replacement. Electronically signed by: Evalene Coho MD 08/01/2024 09:37 AM EST RP Workstation: [id number]   CT CHEST ABDOMEN PELVIS WO CONTRAST Result Date: 07/20/2024 EXAM: CT CHEST, ABDOMEN AND PELVIS WITHOUT CONTRAST 07/20/2024 06:52:00 PM TECHNIQUE: CT of the chest, abdomen and pelvis was performed without the administration of intravenous contrast. Multiplanar reformatted images are provided for review. Automated exposure control, iterative reconstruction, and/or weight based adjustment of the mA/kV was utilized to reduce the radiation dose to as low as reasonably achievable. COMPARISON: 08/20/2023 CLINICAL HISTORY: LDH elevation. Oncology wanted to see if cancerous process. Okay for oral contrast but not intravenous. FINDINGS: CHEST: MEDIASTINUM AND LYMPH NODES: Status post transcatheter aortic valve replacement and coronary artery bypass grafting. Mild cardiomegaly. No pericardial effusion. The central pulmonary arteries are enlarged in  keeping with changes of pulmonary arterial hypertension. Mild atherosclerotic calcification within the thoracic aorta. No aortic aneurysm. There is progressive pathologic mediastinal adenopathy. By example, previously measured left prevascular and  precarinal lymph nodes now measure 12 mm (previously 10 mm) and 20 mm (previously 10 mm) in short axis diameter. The findings suggest either a chronic inflammatory or low-grade lymphoproliferative process. Stable 2.3 cm right thyroid  nodule, better assessed on thyroid  sonogram of 10/12/2020. No follow up imaging is recommended. LUNGS AND PLEURA: Small to moderate bilateral pleural effusions, right greater than left, appears similar to prior examination though there is new pleural thickening bilaterally as well as probable septation noted at the right basilar pleural space which is nonspecific in the setting of repeated thoracentesis. Small focus of pleural gas on the right may relate to recent thoracentesis. No focal consolidation or pulmonary edema. No pneumothorax. ABDOMEN AND PELVIS: LIVER: The liver is unremarkable. GALLBLADDER AND BILE DUCTS: Cholelithiasis without superimposed pericholecystic inflammatory change. No intra or extrahepatic biliary ductal dilation. SPLEEN: No acute abnormality. PANCREAS: No acute abnormality. ADRENAL GLANDS: No acute abnormality. KIDNEYS, URETERS AND BLADDER: Simple exophytic cortical cysts are seen within the kidneys bilaterally for which no follow up imaging is recommended. Of a hyperdense exophytic cortical cyst arises anteriorly but little lower pole of the right kidney but is unchanged from prior examination and was better characterized as a simple cyst on that exam. Per consensus, no follow-up is needed for simple Bosniak type 1 and 2 renal cysts, unless the patient has a malignancy history or risk factors. 7 mm nonobstructing calculus within the lower pole of the left kidney. The kidneys are otherwise unremarkable. No stones in the ureters. No hydronephrosis. No perinephric or periureteral stranding. Urinary bladder is unremarkable. GI AND BOWEL: Moderate stool burden within the distal colon and rectum without evidence of obstruction. The stomach, small bowel, and large  bowel are otherwise unremarkable. Appendix absent. REPRODUCTIVE ORGANS: No acute abnormality. PERITONEUM AND RETROPERITONEUM: No ascites. No free air. VASCULATURE: Aorta is normal in caliber. Extensive aortic iliac atherosclerotic calcification. ABDOMINAL AND PELVIS LYMPH NODES: Pathologic aortic cable adenopathy with a single lymph node measuring 15 mm in short axis diameter is stable since prior examination, nonspecific. No additional pathologic adenopathy within the abdomen and pelvis. REPRODUCTIVE ORGANS: No acute abnormality. BONES AND SOFT TISSUES: Osseous structures are age appropriate. No acute bone abnormality. No lytic or blastic bone lesion. No focal soft tissue abnormality. IMPRESSION: 1. Progressive pathologic mediastinal adenopathy, suggestive of a chronic inflammatory or low-grade lymphoproliferative process. 2. Small to moderate bilateral pleural effusions, right greater than left, with new bilateral pleural thickening and probable right basilar pleural space septation; small right pleural gas likely post-thoracentesis. 3. Stable pathologic aortocaval adenopathy measuring 15 mm in short axis, nonspecific. 4. Stable 2.3 cm right thyroid  nodule; no follow-up imaging is recommended. 5. Cholelithiasis without acute inflammatory change and without biliary ductal dilation. 6. Moderate stool burden in the distal colon and rectum without obstruction. 7. RAF score: Aortic Atherosclerosis (ICD10-I70.0). Electronically signed by: Dorethia Molt MD 07/20/2024 08:42 PM EST RP Workstation: [id number]   DG Chest Port 1 View Result Date: 07/20/2024 CLINICAL DATA:  Status post right thoracentesis. EXAM: PORTABLE CHEST 1 VIEW COMPARISON:  07/18/2024 FINDINGS: Small to moderate-sized right pleural effusion, decreased in size. No pneumothorax. No significant change in right basilar atelectasis and possible pneumonia. Stable small left pleural effusion. Increased linear and patchy density at the left lung base.  Atheromatous aortic arch calcifications. Diffuse osteopenia. IMPRESSION: 1. Small to  moderate-sized right pleural effusion, decreased in size following thoracentesis. 2. No pneumothorax. 3. No significant change in right basilar atelectasis and possible pneumonia. 4. Increased left basilar atelectasis and possible pneumonia. 5. Stable small left pleural effusion. Electronically Signed   By: Elspeth Bathe M.D.   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 Request for diagnostic and therapeutic thoracentesis. EXAM: ULTRASOUND GUIDED DIAGNOSTIC AND THERAPEUTIC, RIGHT-SIDED THORACENTESIS MEDICATIONS: 1% lidocaine , 8 mL. COMPLICATIONS: None immediate. PROCEDURE: An ultrasound guided thoracentesis was thoroughly discussed with the patient and questions answered. The benefits, risks, alternatives and complications were also discussed. The patient understands and wishes to proceed with the procedure. Written consent was obtained. Ultrasound was performed and was notable for several loculations throughout the right chest. An adequate pocket of fluid was identified in the right chest. The area was then prepped and draped in the normal sterile fashion. 1% Lidocaine  was used for local anesthesia. Under ultrasound guidance a 6 Fr Safe-T-Centesis catheter was introduced. Thoracentesis was performed. Unfortunately, due to the septations, only a small amount of fluid was able to be drained. Residual fluid noted to the right lateral chest on post procedure ultrasound. The catheter was removed and a dressing applied. FINDINGS: A total of approximately 450 mL of amber fluid was removed. Samples were sent to the laboratory as requested by the clinical team. IMPRESSION: Successful  ultrasound guided right-sided thoracentesis yielding 450 mL of pleural fluid. Procedure performed by: Sherrilee Bal, PA-C under the supervision of Dr. ONEIDA Specking. Electronically Signed   By: CHRISTELLA.  Shick M.D.   On: 07/20/2024 11:14   DG Chest 2 View Result Date: 07/18/2024 CLINICAL DATA:  Chest pain and shortness of breath EXAM: CHEST - 2 VIEW COMPARISON:  07/02/2024 FINDINGS: Frontal and lateral views of the chest demonstrate stable postsurgical changes from median sternotomy and aortic valve replacement. Cardiac silhouette is enlarged but stable. Persistent bibasilar veiling opacities consistent with consolidation and effusions, right greater than left. No pneumothorax. No acute bony abnormalities. IMPRESSION: 1. Stable bibasilar consolidation and effusions, right greater than left. 2. Stable enlarged cardiac silhouette. Electronically Signed   By: Ozell Daring M.D.   On: 07/18/2024 14:53    Microbiology: Results for orders placed or performed during the hospital encounter of 07/18/24  Body fluid culture w Gram Stain     Status: None   Collection Time: 07/20/24 10:06 AM   Specimen: PATH Cytology Pleural fluid  Result Value Ref Range Status   Specimen Description   Final    PLEURAL Performed at [facility], [address], [place], [place] [address]    Special Requests   Final    NONE Performed at [facility], [address], [place], [place] [address]    Gram Stain NO WBC SEEN NO ORGANISMS SEEN   Final   Culture   Final    NO GROWTH 3 DAYS Performed at [facility], [address], [place], [place] [address]    Report Status 07/23/2024 FINAL  Final    Labs: CBC: Recent Labs  Lab 08/10/24 0732 08/12/24 0408 08/13/24 0359 08/13/24 1417 08/15/24 0427  WBC 8.9 5.7 5.9  --  11.3*  HGB 8.0* 7.1* 7.1* 8.0* 8.1*  HCT 26.1* 22.3* 22.5* 24.6* 25.6*  MCV 102.8* 101.8* 101.8*  --  101.2*  PLT 159 111* 94*  --  112*   Basic Metabolic Panel: Recent Labs   Lab 08/11/24 0521 08/12/24 0408 08/13/24 0359 08/14/24 0448 08/15/24 0427  NA 141 139 137 139 139  K 4.5 4.3 4.3 4.6 3.9  CL 102 100 99 99 99  CO2 31 30 31  34* 34*  GLUCOSE 152* 153* 155* 168* 153*  BUN 38* 42* 43* 46* 46*  CREATININE 1.74* 1.78* 1.85* 1.95* 1.87*  CALCIUM  10.2 10.0 10.2 10.1 10.3  MG  --   --  2.2 2.1  --   PHOS  --   --   --  3.3  --    Liver Function Tests: Recent Labs  Lab 08/10/24 0732 08/14/24 0448  AST 45*  --   ALT 12  --   ALKPHOS 129*  --   BILITOT 1.9*  --   PROT 8.1  --   ALBUMIN 4.2 3.5   CBG: No results for input(s): GLUCAP in the last 168 hours.  Discharge time spent: greater than 30 minutes.  Signed: AIDA CHO, MD [facility] 08/15/2024 "

## 2024-08-15 NOTE — Plan of Care (Signed)

## 2024-08-15 NOTE — Progress Notes (Signed)
 [facility] Cardiology  CARDIOLOGY PROGRESS NOTE  Patient ID: [name] MRN: [id number] DOB/AGE: 1946-03-03 80 y.o.  Admit date: 08/10/2024 Referring Physician Dr. Jens Reason for Consultation Heart Failure  HPI: Feels breathing is stable.  Pedal edema improved.  Somewhat frustrated that etiology of his anemia is not identified yet.  Review of systems complete and found to be negative unless listed above     Past Medical History:  Diagnosis Date   (HFpEF) heart failure with preserved ejection fraction (HCC) 07/25/2016   a.)TTE 07/25/16: EF >55, triv-mild pan regur, mild AS, G2DD; b.)TTE 09/02/16: EF >55, triv TR, mild AS; c.)TTE 11/05/16: EF >55, LAE, triv-mild pan regur, mild AS, G2DD; d.)TTE 01/21/20: EF >55, mod LVH, LAE/RVE, triv-mild AR/PR/TR, mod MR, mild AS, G2DD; e.)TTE 09/24/22: EF>55, sev LVH, sev LAE, RAE, mild PR, mod AR/MR/TR, mild-mod AS, RVSP 74.7; f.)TTE 12/23/22: EF 60-65, LAE, mild-mod MR/TR, mild AS   Abnormal urine 05/01/2023   Anemia    Aortic atherosclerosis    Aortic stenosis 07/25/2016   a.) TTE 07/25/2016: mild (MPG 13.5); b.) TTE 09/02/2016: mild (MPG 16); c.) TTE 11/05/2016: mild (MPG 9); d.) TTE 01/21/2020: mild (MPG 11.7); e.) TTE 09/24/2022: mild-mod (MPG 20.3); f.) TTE 12/23/2022: mild (MPG 15)   Atrial fibrillation (HCC)    a.) CHA2DS2VASc = 6 (age x2, CHF, HTN, vascular disease history, T2DM);  b.) rate/rhythm maintained on oral labetalol ; chronically anticoagulated with apixaban  + clopidogrel    AV block, 1st degree    B12 deficiency    CKD (chronic kidney disease), stage III (HCC)    Colon polyps    Congestive heart failure (HCC) 05/01/2023   Coronary artery disease 08/16/2016   a.) s/p 3v CABG 08/30/2016; b.) s/p PCI 11/26/2016 (DES x 1 oSVG-RCA); c.) s/p PCI 12/17/2016 (DES x 5 --> mLCx x2, dLCx, pLAD, dLAD); c.) s/p PCI 06/09/2020 (DES x1 --> ISR oSVG-PDA)   Cyst of kidney, acquired 07/03/2023   Dyspnea    Gastroesophageal reflux disease 05/01/2023    GERD (gastroesophageal reflux disease)    Heart murmur    Hepatic steatosis    History of 2019 novel coronavirus disease (COVID-19) 09/02/2020   a.) Tx'd with remdesivir    History of blood transfusion    History of GI bleed 09/02/2020   a.) admitted to [facility] 09/02/2020 - 09/08/2020   History of heart artery stent 11/26/2016   TOTAL of 7 stents: a.) 11/26/2016 --> 3.5 x 22 mm Resolute Integrity oSVG-RCA; b.) 12/17/2016: 3.0 x 12 mm Xience Alpine dLCx, 3.5 x 22 mm Resolute Onyx mLCx, 3.5 x 18 mm Resolute Onyx mLCx, 3.0 x 26 mm Resolute Onyx pLAD, 2.0 x 26 mm Resolute Onyx dLAD; c.) 06/09/2020 --> 2.5 x 18 mm Resolute Onyx ISR oSVG-PDA   History of partial colectomy 08/12/2012   a.) large gastric hyperplastic polyp removal   HLD (hyperlipidemia)    Hypertension    LBBB (left bundle branch block)    Long term current use of anticoagulant    a.) apixaban    Long term current use of antithrombotics/antiplatelets    a.) clopidogrel    Male hypogonadism    a.) on exogenous TRT (1.62% gel)   Mild cardiomegaly    Mild gynecomastia (bilateral)    Nephrolithiasis    OSA (obstructive sleep apnea)    a.) does not utilize nocturnal PAP therapy   Personal history of surgery to heart and great vessels, presenting hazards to health 05/01/2023   Proteinuria, unspecified 08/01/2023   Pulmonary hypertension (HCC) 09/24/2022  a.) TTE 09/24/2022: RVSP 74.7; b.) TTE 12/23/2022: RVSP 31.3   RBBB (right bundle branch block with left anterior fascicular block)    Right inguinal hernia    Right thyroid  nodule    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   a.) LIMA-LAD, SVG-PDA, SVG-OM3   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/01/2023   Type 2 diabetes mellitus treated with insulin  (HCC)    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   Umbilical hernia    Unstable angina (HCC)    Vitamin D  deficiency     Past Surgical History:  Procedure Laterality Date   ANOMALOUS PULMONARY VENOUS  RETURN REPAIR, TOTAL  2025   APPENDECTOMY     CARDIAC CATHETERIZATION Left 08/16/2016   Procedure: Left Heart Cath and Coronary Angiography;  Surgeon: Cara JONETTA Lovelace, MD;  Location: [facility] INVASIVE CV LAB;  Service: Cardiovascular;  Laterality: Left;   COLONOSCOPY N/A 09/07/2020   Procedure: COLONOSCOPY;  Surgeon: Janalyn Keene NOVAK, MD;  Location: [facility] ENDOSCOPY;  Service: Endoscopy;  Laterality: N/A;   COLONOSCOPY N/A 10/19/2023   Procedure: COLONOSCOPY;  Surgeon: Jinny Carmine, MD;  Location: [facility] ENDOSCOPY;  Service: Endoscopy;  Laterality: N/A;   COLONOSCOPY WITH PROPOFOL  N/A 05/17/2021   Procedure: COLONOSCOPY WITH PROPOFOL ;  Surgeon: Janalyn Keene NOVAK, MD;  Location: [facility] ENDOSCOPY;  Service: Endoscopy;  Laterality: N/A;   CORONARY ANGIOPLASTY WITH STENT PLACEMENT Left 11/26/2016   Procedure: CORONARY ANGIOPLASTY WITH STENT PLACEMENT; Location: [facility]; Surgeon: Alm Dais, MD   CORONARY ARTERY BYPASS GRAFT N/A 08/30/2016   Procedure: CORONARY ARTERY BYPASS GRAFT; Location: [facility]; Surgeon: Juliene Pouch, MD   CORONARY STENT INTERVENTION N/A 06/09/2020   Procedure: CORONARY STENT INTERVENTION;  Surgeon: Lovelace Cara JONETTA, MD;  Location: [facility];  Service: Cardiovascular;  Laterality: N/A;   CORONARY STENT INTERVENTION Left 12/17/2016   Procedure: CORONARY STENT INTERVENTION; Location: [facility]; Surgeon: Alm Dais, MD   CORONARY STENT INTERVENTION  03/2024   ENDOSCOPIC MUCOSAL RESECTION N/A 09/22/2020   Procedure: ENDOSCOPIC MUCOSAL RESECTION;  Surgeon: [name] Aloha Raddle., MD;  Location: [facility] ENDOSCOPY;  Service: Gastroenterology;  Laterality: N/A;   ESOPHAGOGASTRODUODENOSCOPY N/A 09/07/2020   Procedure: ESOPHAGOGASTRODUODENOSCOPY (EGD);  Surgeon: Janalyn Keene NOVAK, MD;  Location: [facility] ENDOSCOPY;  Service: Endoscopy;  Laterality: N/A;   ESOPHAGOGASTRODUODENOSCOPY N/A 10/19/2023   Procedure: EGD (ESOPHAGOGASTRODUODENOSCOPY);  Surgeon: Jinny Carmine, MD;  Location: [facility]  ENDOSCOPY;  Service: Endoscopy;  Laterality: N/A;   ESOPHAGOGASTRODUODENOSCOPY (EGD) WITH PROPOFOL  N/A 09/22/2020   Procedure: ESOPHAGOGASTRODUODENOSCOPY (EGD) WITH PROPOFOL ;  Surgeon: [name] Aloha Raddle., MD;  Location: [facility] ENDOSCOPY;  Service: Gastroenterology;  Laterality: N/A;   ESOPHAGOGASTRODUODENOSCOPY (EGD) WITH PROPOFOL  N/A 08/26/2023   Procedure: ESOPHAGOGASTRODUODENOSCOPY (EGD) WITH PROPOFOL ;  Surgeon: Jinny Carmine, MD;  Location: [facility] ENDOSCOPY;  Service: Endoscopy;  Laterality: N/A;   FRACTURE SURGERY     HEMOSTASIS CLIP PLACEMENT  09/22/2020   Procedure: HEMOSTASIS CLIP PLACEMENT;  Surgeon: [name] Aloha Raddle., MD;  Location: [facility] ENDOSCOPY;  Service: Gastroenterology;;   HEMOSTASIS CONTROL  09/22/2020   Procedure: HEMOSTASIS CONTROL;  Surgeon: [name] Aloha Raddle., MD;  Location: [facility] ENDOSCOPY;  Service: Gastroenterology;;   INSERTION OF MESH  02/19/2023   Procedure: INSERTION OF MESH;  Surgeon: Desiderio Schanz, MD;  Location: [facility] ORS;  Service: General;;  umbilical   LAPAROSCOPIC PARTIAL RIGHT COLECTOMY Right 08/12/2012   Procedure: LAPAROSCOPIC PARTIAL RIGHT COLECTOMY; Location: [facility]; Surgeon: Unknown Sharps, MD   LEFT HEART CATH AND CORONARY ANGIOGRAPHY Left 06/09/2020   Procedure: LEFT HEART CATH AND CORONARY  ANGIOGRAPHY;  Surgeon: Florencio Cara BIRCH, MD;  Location: [facility];  Service: Cardiovascular;  Laterality: Left;   LEFT HEART CATH AND CORS/GRAFTS ANGIOGRAPHY Left 11/20/2016   Procedure: Left Heart Cath and Cors/Grafts Angiography;  Surgeon: Cara BIRCH Florencio, MD;  Location: [facility];  Service: Cardiovascular;  Laterality: Left;   POLYPECTOMY  09/22/2020   Procedure: POLYPECTOMY;  Surgeon: [name] Aloha Raddle., MD;  Location: [facility] ENDOSCOPY;  Service: Gastroenterology;;   POLYPECTOMY  10/19/2023   Procedure: POLYPECTOMY;  Surgeon: Jinny Carmine, MD;  Location: [facility] ENDOSCOPY;  Service: Endoscopy;;   RIGHT HEART CATH Right 07/01/2024    Procedure: RIGHT HEART CATH;  Surgeon: Ammon Blunt, MD;  Location: [facility];  Service: Cardiovascular;  Laterality: Right;   SUBMUCOSAL LIFTING INJECTION  09/22/2020   Procedure: SUBMUCOSAL LIFTING INJECTION;  Surgeon: [name] Aloha Raddle., MD;  Location: [facility] ENDOSCOPY;  Service: Gastroenterology;;   UMBILICAL HERNIA REPAIR N/A 02/19/2023   Procedure: HERNIA REPAIR UMBILICAL ADULT, open;  Surgeon: Desiderio Schanz, MD;  Location: [facility] ORS;  Service: General;  Laterality: N/A;   XI ROBOTIC ASSISTED INGUINAL HERNIA REPAIR WITH MESH Right 02/19/2023   Procedure: XI ROBOTIC ASSISTED INGUINAL HERNIA REPAIR WITH MESH;  Surgeon: Desiderio Schanz, MD;  Location: [facility] ORS;  Service: General;  Laterality: Right;    Medications Prior to Admission  Medication Sig Dispense Refill Last Dose/Taking   amLODipine  (NORVASC ) 10 MG tablet Take 10 mg by mouth daily.   08/09/2024   aspirin  EC 81 MG tablet Take 81 mg by mouth daily.   08/09/2024   atorvastatin  (LIPITOR ) 80 MG tablet Take 80 mg by mouth.  Take 80 mg by mouth in the morning.   08/09/2024   bisacodyl  (DULCOLAX) 5 MG EC tablet Take 5 mg by mouth as needed for moderate constipation.   Unknown   carvedilol  (COREG ) 3.125 MG tablet Take 1 tablet (3.125 mg total) by mouth 2 (two) times daily with a meal. 60 tablet 1 08/09/2024   cyanocobalamin  (VITAMIN B12) 1000 MCG tablet Take 1,000 mcg by mouth daily.   08/09/2024   dexlansoprazole (DEXILANT) 60 MG capsule Take 1 capsule by mouth daily.   08/09/2024   empagliflozin  (JARDIANCE ) 10 MG TABS tablet Take 1 tablet (10 mg total) by mouth daily before breakfast. 30 tablet 0 08/09/2024   Ferrous Sulfate  (IRON ) 325 (65 Fe) MG TABS Take 1 tablet by mouth daily.   08/09/2024   isosorbide  mononitrate (IMDUR ) 120 MG 24 hr tablet Take 120 mg by mouth daily.   08/09/2024   lactulose  (CHRONULAC ) 10 GM/15ML solution Taking 15-20 cc by mouth 3 to 4 times a day for constiption (Patient taking differently: Taking  15-20 cc by mouth 3 to 4 times a day for constiption as needed) 946 mL 2 08/09/2024   nitroGLYCERIN  (NITROSTAT ) 0.4 MG SL tablet Place 0.4 mg under the tongue every 5 (five) minutes as needed for chest pain.   Unknown   torsemide  (DEMADEX ) 20 MG tablet Take 2 tablets (40 mg total) by mouth daily. 60 tablet 0 Unknown   clopidogrel  (PLAVIX ) 75 MG tablet Take 75 mg by mouth daily. (Patient not taking: Reported on 08/10/2024)   Not Taking   Social History   Socioeconomic History   Marital status: Married    Spouse name: Not on file   Number of children: Not on file   Years of education: Not on file   Highest education level: Not on file  Occupational History   Not on file  Tobacco Use   Smoking status: Former    Current packs/day: 0.00    Types: Cigarettes    Quit date: 2000    Years since quitting: 26.0    Passive exposure: Never   Smokeless tobacco: Never  Vaping Use   Vaping status: Never Used  Substance and Sexual Activity   Alcohol use: No   Drug use: No   Sexual activity: Not Currently  Other Topics Concern   Not on file  Social History Narrative   Not on file   Social Drivers of Health   Tobacco Use: Medium Risk (08/10/2024)   Patient History    Smoking Tobacco Use: Former    Smokeless Tobacco Use: Never    Passive Exposure: Never  Physicist, Medical Strain: Low Risk  (05/08/2024)   Received from [facility]   Overall Financial Resource Strain (CARDIA)    Difficulty of Paying Living Expenses: Not hard at all  Food Insecurity: No Food Insecurity (08/11/2024)   Epic    Worried About Radiation Protection Practitioner of Food in the Last Year: Never true    Ran Out of Food in the Last Year: Never true  Transportation Needs: No Transportation Needs (08/11/2024)   Epic    Lack of Transportation (Medical): No    Lack of Transportation (Non-Medical): No  Physical Activity: Not on file  Stress: Not on file  Social Connections: Unknown (08/11/2024)   Social Connection and  Isolation Panel    Frequency of Communication with Friends and Family: More than three times a week    Frequency of Social Gatherings with Friends and Family: More than three times a week    Attends Religious Services: Not on Marketing Executive or Organizations: Not on file    Attends Banker Meetings: Not on file    Marital Status: Not on file  Recent Concern: Social Connections - Moderately Isolated (07/18/2024)   Social Connection and Isolation Panel    Frequency of Communication with Friends and Family: More than three times a week    Frequency of Social Gatherings with Friends and Family: More than three times a week    Attends Religious Services: Never    Database Administrator or Organizations: No    Attends Banker Meetings: Never    Marital Status: Married  Catering Manager Violence: Unknown (08/11/2024)   Epic    Fear of Current or Ex-Partner: No    Emotionally Abused: No    Physically Abused: Not on file    Sexually Abused: Not on file  Depression (PHQ2-9): Low Risk (07/16/2024)   Depression (PHQ2-9)    PHQ-2 Score: 0  Alcohol Screen: Not on file  Housing: Unknown (08/11/2024)   Epic    Unable to Pay for Housing in the Last Year: No    Number of Times Moved in the Last Year: 0    Homeless in the Last Year: Not on file  Utilities: Not At Risk (08/11/2024)   Epic    Threatened with loss of utilities: No  Health Literacy: Not on file    Family History  Problem Relation Age of Onset   Hyperlipidemia Mother    Heart disease Mother    Hypertension Father       Review of systems complete and found to be negative unless listed above      PHYSICAL EXAM  Alert and oriented x 3 +1 pedal edema Grade 3 systolic murmur aortic and mitral  area Decreased basilar breath  Labs:   Lab Results  Component Value Date   WBC 11.3 (H) 08/15/2024   HGB 8.1 (L) 08/15/2024   HCT 25.6 (L) 08/15/2024   MCV 101.2 (H) 08/15/2024   PLT 112  (L) 08/15/2024    Recent Labs  Lab 08/10/24 0732 08/11/24 0521 08/15/24 0427  NA 141   < > 139  K 4.7   < > 3.9  CL 102   < > 99  CO2 28   < > 34*  BUN 37*   < > 46*  CREATININE 1.67*   < > 1.87*  CALCIUM  11.1*   < > 10.3  PROT 8.1  --   --   BILITOT 1.9*  --   --   ALKPHOS 129*  --   --   ALT 12  --   --   AST 45*  --   --   GLUCOSE 168*   < > 153*   < > = values in this interval not displayed.   No results found for: CKTOTAL, CKMB, CKMBINDEX, TROPONINI  Lab Results  Component Value Date   CHOL 85 12/21/2022   Lab Results  Component Value Date   HDL 45 12/21/2022   Lab Results  Component Value Date   LDLCALC 29 12/21/2022   Lab Results  Component Value Date   TRIG 53 12/21/2022   Lab Results  Component Value Date   CHOLHDL 1.9 12/21/2022   No results found for: LDLDIRECT    Radiology: [facility] Chest Port 1 View Result Date: 08/10/2024 EXAM: 1 VIEW(S) XRAY OF THE CHEST 08/10/2024 04:14:18 PM COMPARISON: 08/10/2024 CLINICAL HISTORY: S/P thoracentesis FINDINGS: LINES, TUBES AND DEVICES: TAVR in place. CABG markers noted. LUNGS AND PLEURA: Mild pulmonary edema, unchanged. Stable small to moderate bilateral pleural effusions. Unchanged bibasilar opacities. No pneumothorax. HEART AND MEDIASTINUM: Stable cardiomegaly. Aortic atherosclerosis. BONES AND SOFT TISSUES: Median sternotomy wires noted. No acute osseous abnormality. IMPRESSION: 1. Mild pulmonary edema, unchanged. 2. Stable small to moderate bilateral pleural effusions. Electronically signed by: Greig Pique MD 08/10/2024 06:13 PM EST RP Workstation: [id number]   US  THORACENTESIS ASP PLEURAL SPACE W/IMG GUIDE Result Date: 08/10/2024 INDICATION: 79 year old male with an extensive cardiac history who presented to the ED today with worsening exertional chest pain and dyspnea on exertion. Workup consistent with heart failure exacerbation and imaging concerning for moderate bilateral pleural effusions with mild  pulmonary edema and bibasilar atelectasis. Request for diagnostic and therapeutic thoracentesis. EXAM: ULTRASOUND GUIDED DIAGNOSTIC AND THERAPEUTIC, LEFT-SIDED THORACENTESIS MEDICATIONS: 1% lidocaine , 6 mL. COMPLICATIONS: None immediate. PROCEDURE: An ultrasound guided thoracentesis was thoroughly discussed with the patient and questions answered. The benefits, risks, alternatives and complications were also discussed. The patient understands and wishes to proceed with the procedure. Written consent was obtained. Ultrasound was performed to localize and mark an adequate pocket of fluid in the left chest. The area was then prepped and draped in the normal sterile fashion. 1% Lidocaine  was used for local anesthesia. Under ultrasound guidance a 6 Fr Safe-T-Centesis catheter was introduced. Thoracentesis was performed. The catheter was removed and a dressing applied. FINDINGS: A total of approximately 700 mL of serous sanguinous fluid was removed. Samples were sent to the laboratory as requested by the clinical team. IMPRESSION: Successful ultrasound guided left-sided thoracentesis yielding 700 mL of pleural fluid. Procedure performed by: Sherrilee Bal, PA-C under the supervision of Dr. VEAR Lent Electronically Signed   By: Wilkie Lent M.D.   On: 08/10/2024 16:38  DG Chest 2 View Result Date: 08/10/2024 EXAM: 2 VIEW(S) XRAY OF THE CHEST 08/10/2024 08:36:00 AM COMPARISON: 08/01/2024 CLINICAL HISTORY: dyspnea, cp FINDINGS: LUNGS AND PLEURA: Moderate bilateral pleural effusions. Mild pulmonary edema. Bibasilar atelectasis/airspace disease. No pneumothorax. HEART AND MEDIASTINUM: Stable CABG markers. TAVR noted. Aortic atherosclerosis. BONES AND SOFT TISSUES: Sternotomy wires noted. No acute osseous abnormality. IMPRESSION: 1. Moderate bilateral pleural effusions with mild pulmonary edema and bibasilar atelectasis/airspace disease. Electronically signed by: Waddell Calk MD 08/10/2024 08:41 AM EST RP  Workstation: [id number]   DG Chest 1 View Result Date: 08/01/2024 EXAM: 1 VIEW(S) XRAY OF THE CHEST 08/01/2024 09:32:09 AM COMPARISON: 07/20/2024 CLINICAL HISTORY: SOB (shortness of breath) FINDINGS: LUNGS AND PLEURA: Bibasilar airspace opacities are stable. Mild pulmonary edema is present. Moderate right pleural effusion, slightly decreased. Similar small left pleural effusion. No pneumothorax. HEART AND MEDIASTINUM: Stable cardiomegaly. Aortic atherosclerotic calcification. Aortic valve replacement is noted. BONES AND SOFT TISSUES: Median sternotomy is noted. No acute osseous abnormality. IMPRESSION: 1. Moderate right pleural effusion, slightly decreased, and similar small left pleural effusion. 2. Stable bibasilar airspace opacities and mild pulmonary edema. 3. Stable cardiomegaly and aortic atherosclerotic calcification. 4. Postsurgical changes of median sternotomy and aortic valve replacement. Electronically signed by: Evalene Coho MD 08/01/2024 09:37 AM EST RP Workstation: [id number]   CT CHEST ABDOMEN PELVIS WO CONTRAST Result Date: 07/20/2024 EXAM: CT CHEST, ABDOMEN AND PELVIS WITHOUT CONTRAST 07/20/2024 06:52:00 PM TECHNIQUE: CT of the chest, abdomen and pelvis was performed without the administration of intravenous contrast. Multiplanar reformatted images are provided for review. Automated exposure control, iterative reconstruction, and/or weight based adjustment of the mA/kV was utilized to reduce the radiation dose to as low as reasonably achievable. COMPARISON: 08/20/2023 CLINICAL HISTORY: LDH elevation. Oncology wanted to see if cancerous process. Okay for oral contrast but not intravenous. FINDINGS: CHEST: MEDIASTINUM AND LYMPH NODES: Status post transcatheter aortic valve replacement and coronary artery bypass grafting. Mild cardiomegaly. No pericardial effusion. The central pulmonary arteries are enlarged in keeping with changes of pulmonary arterial hypertension. Mild atherosclerotic  calcification within the thoracic aorta. No aortic aneurysm. There is progressive pathologic mediastinal adenopathy. By example, previously measured left prevascular and precarinal lymph nodes now measure 12 mm (previously 10 mm) and 20 mm (previously 10 mm) in short axis diameter. The findings suggest either a chronic inflammatory or low-grade lymphoproliferative process. Stable 2.3 cm right thyroid  nodule, better assessed on thyroid  sonogram of 10/12/2020. No follow up imaging is recommended. LUNGS AND PLEURA: Small to moderate bilateral pleural effusions, right greater than left, appears similar to prior examination though there is new pleural thickening bilaterally as well as probable septation noted at the right basilar pleural space which is nonspecific in the setting of repeated thoracentesis. Small focus of pleural gas on the right may relate to recent thoracentesis. No focal consolidation or pulmonary edema. No pneumothorax. ABDOMEN AND PELVIS: LIVER: The liver is unremarkable. GALLBLADDER AND BILE DUCTS: Cholelithiasis without superimposed pericholecystic inflammatory change. No intra or extrahepatic biliary ductal dilation. SPLEEN: No acute abnormality. PANCREAS: No acute abnormality. ADRENAL GLANDS: No acute abnormality. KIDNEYS, URETERS AND BLADDER: Simple exophytic cortical cysts are seen within the kidneys bilaterally for which no follow up imaging is recommended. Of a hyperdense exophytic cortical cyst arises anteriorly but little lower pole of the right kidney but is unchanged from prior examination and was better characterized as a simple cyst on that exam. Per consensus, no follow-up is needed for simple Bosniak type 1 and 2 renal cysts, unless the patient has a  malignancy history or risk factors. 7 mm nonobstructing calculus within the lower pole of the left kidney. The kidneys are otherwise unremarkable. No stones in the ureters. No hydronephrosis. No perinephric or periureteral stranding.  Urinary bladder is unremarkable. GI AND BOWEL: Moderate stool burden within the distal colon and rectum without evidence of obstruction. The stomach, small bowel, and large bowel are otherwise unremarkable. Appendix absent. REPRODUCTIVE ORGANS: No acute abnormality. PERITONEUM AND RETROPERITONEUM: No ascites. No free air. VASCULATURE: Aorta is normal in caliber. Extensive aortic iliac atherosclerotic calcification. ABDOMINAL AND PELVIS LYMPH NODES: Pathologic aortic cable adenopathy with a single lymph node measuring 15 mm in short axis diameter is stable since prior examination, nonspecific. No additional pathologic adenopathy within the abdomen and pelvis. REPRODUCTIVE ORGANS: No acute abnormality. BONES AND SOFT TISSUES: Osseous structures are age appropriate. No acute bone abnormality. No lytic or blastic bone lesion. No focal soft tissue abnormality. IMPRESSION: 1. Progressive pathologic mediastinal adenopathy, suggestive of a chronic inflammatory or low-grade lymphoproliferative process. 2. Small to moderate bilateral pleural effusions, right greater than left, with new bilateral pleural thickening and probable right basilar pleural space septation; small right pleural gas likely post-thoracentesis. 3. Stable pathologic aortocaval adenopathy measuring 15 mm in short axis, nonspecific. 4. Stable 2.3 cm right thyroid  nodule; no follow-up imaging is recommended. 5. Cholelithiasis without acute inflammatory change and without biliary ductal dilation. 6. Moderate stool burden in the distal colon and rectum without obstruction. 7. RAF score: Aortic Atherosclerosis (ICD10-I70.0). Electronically signed by: Dorethia Molt MD 07/20/2024 08:42 PM EST RP Workstation: [id number]   DG Chest Port 1 View Result Date: 07/20/2024 CLINICAL DATA:  Status post right thoracentesis. EXAM: PORTABLE CHEST 1 VIEW COMPARISON:  07/18/2024 FINDINGS: Small to moderate-sized right pleural effusion, decreased in size. No pneumothorax. No  significant change in right basilar atelectasis and possible pneumonia. Stable small left pleural effusion. Increased linear and patchy density at the left lung base. Atheromatous aortic arch calcifications. Diffuse osteopenia. IMPRESSION: 1. Small to moderate-sized right pleural effusion, decreased in size following thoracentesis. 2. No pneumothorax. 3. No significant change in right basilar atelectasis and possible pneumonia. 4. Increased left basilar atelectasis and possible pneumonia. 5. Stable small left pleural effusion. Electronically Signed   By: Elspeth Bathe M.D.   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 Request for diagnostic and therapeutic thoracentesis. EXAM: ULTRASOUND GUIDED DIAGNOSTIC AND THERAPEUTIC, RIGHT-SIDED THORACENTESIS MEDICATIONS: 1% lidocaine , 8 mL. COMPLICATIONS: None immediate. PROCEDURE: An ultrasound guided thoracentesis was thoroughly discussed with the patient and questions answered. The benefits, risks, alternatives and complications were also discussed. The patient understands and wishes to proceed with the procedure. Written consent was obtained. Ultrasound was performed and was notable for several loculations throughout the right chest. An adequate pocket of fluid was identified in the right chest. The area was then prepped and draped in the normal sterile fashion. 1% Lidocaine  was used for local anesthesia. Under ultrasound guidance a 6 Fr Safe-T-Centesis catheter was introduced. Thoracentesis was performed. Unfortunately, due to the septations, only a small amount of fluid was able to be drained. Residual fluid noted to the right lateral chest on post procedure ultrasound. The catheter was removed and a dressing  applied. FINDINGS: A total of approximately 450 mL of amber fluid was removed. Samples were sent to the laboratory as requested  by the clinical team. IMPRESSION: Successful ultrasound guided right-sided thoracentesis yielding 450 mL of pleural fluid. Procedure performed by: Sherrilee Bal, PA-C under the supervision of Dr. ONEIDA Specking. Electronically Signed   By: CHRISTELLA.  Shick M.D.   On: 07/20/2024 11:14   DG Chest 2 View Result Date: 07/18/2024 CLINICAL DATA:  Chest pain and shortness of breath EXAM: CHEST - 2 VIEW COMPARISON:  07/02/2024 FINDINGS: Frontal and lateral views of the chest demonstrate stable postsurgical changes from median sternotomy and aortic valve replacement. Cardiac silhouette is enlarged but stable. Persistent bibasilar veiling opacities consistent with consolidation and effusions, right greater than left. No pneumothorax. No acute bony abnormalities. IMPRESSION: 1. Stable bibasilar consolidation and effusions, right greater than left. 2. Stable enlarged cardiac silhouette. Electronically Signed   By: Ozell Daring M.D.   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  I believe increasing hemoglobin is also helping with his symptoms.   Continue torsemide  60 mg daily Continue GDMT with Jardiance , carvedilol . Continue aspirin , statin Continue amlodipine  and Imdur . Recommend goal hemoglobin greater than 8.  Cardiology follow-up as outpatient in a week.  Signed: Keller JAYSON Paterson MD 08/15/2024, 8:56 AM

## 2024-08-15 NOTE — Progress Notes (Signed)
 Patients discharge summary went over with him , and how to continue medication regimen, removed IV from RFA.  Removed telemetry and returned box, patients son at bedside, Patient getting dressed and we will take him out via wheelchair

## 2024-08-17 ENCOUNTER — Other Ambulatory Visit: Payer: Self-pay | Admitting: Nurse Practitioner

## 2024-08-17 NOTE — Progress Notes (Unsigned)
 {  Select_[email or web address]:[id number]}

## 2024-08-18 ENCOUNTER — Inpatient Hospital Stay: Admission: EM | Admit: 2024-08-18 | Discharge: 2024-08-23 | DRG: 291 | Disposition: A | Discharge status: Hospice

## 2024-08-18 ENCOUNTER — Emergency Department

## 2024-08-18 ENCOUNTER — Inpatient Hospital Stay

## 2024-08-18 ENCOUNTER — Other Ambulatory Visit: Payer: Self-pay

## 2024-08-18 ENCOUNTER — Inpatient Hospital Stay: Admit: 2024-08-18 | Discharge: 2024-08-18 | Disposition: A | Attending: Internal Medicine

## 2024-08-18 DIAGNOSIS — D539 Nutritional anemia, unspecified: Secondary | ICD-10-CM | POA: Diagnosis present

## 2024-08-18 DIAGNOSIS — I480 Paroxysmal atrial fibrillation: Secondary | ICD-10-CM | POA: Diagnosis not present

## 2024-08-18 DIAGNOSIS — I700 Atherosclerosis of aorta: Secondary | ICD-10-CM | POA: Diagnosis present

## 2024-08-18 DIAGNOSIS — E1169 Type 2 diabetes mellitus with other specified complication: Secondary | ICD-10-CM | POA: Diagnosis present

## 2024-08-18 DIAGNOSIS — Y92009 Unspecified place in unspecified non-institutional (private) residence as the place of occurrence of the external cause: Secondary | ICD-10-CM

## 2024-08-18 DIAGNOSIS — E119 Type 2 diabetes mellitus without complications: Secondary | ICD-10-CM

## 2024-08-18 DIAGNOSIS — Z952 Presence of prosthetic heart valve: Secondary | ICD-10-CM | POA: Diagnosis not present

## 2024-08-18 DIAGNOSIS — Z8616 Personal history of COVID-19: Secondary | ICD-10-CM | POA: Diagnosis not present

## 2024-08-18 DIAGNOSIS — G8929 Other chronic pain: Secondary | ICD-10-CM | POA: Diagnosis present

## 2024-08-18 DIAGNOSIS — E7849 Other hyperlipidemia: Secondary | ICD-10-CM | POA: Diagnosis present

## 2024-08-18 DIAGNOSIS — I340 Nonrheumatic mitral (valve) insufficiency: Secondary | ICD-10-CM | POA: Diagnosis present

## 2024-08-18 DIAGNOSIS — I509 Heart failure, unspecified: Secondary | ICD-10-CM | POA: Diagnosis not present

## 2024-08-18 DIAGNOSIS — D631 Anemia in chronic kidney disease: Secondary | ICD-10-CM | POA: Diagnosis present

## 2024-08-18 DIAGNOSIS — D649 Anemia, unspecified: Secondary | ICD-10-CM | POA: Diagnosis not present

## 2024-08-18 DIAGNOSIS — R7881 Bacteremia: Secondary | ICD-10-CM | POA: Diagnosis not present

## 2024-08-18 DIAGNOSIS — L03114 Cellulitis of left upper limb: Secondary | ICD-10-CM | POA: Diagnosis present

## 2024-08-18 DIAGNOSIS — Z951 Presence of aortocoronary bypass graft: Secondary | ICD-10-CM | POA: Diagnosis not present

## 2024-08-18 DIAGNOSIS — J9601 Acute respiratory failure with hypoxia: Secondary | ICD-10-CM | POA: Diagnosis present

## 2024-08-18 DIAGNOSIS — Z8249 Family history of ischemic heart disease and other diseases of the circulatory system: Secondary | ICD-10-CM

## 2024-08-18 DIAGNOSIS — Z515 Encounter for palliative care: Secondary | ICD-10-CM | POA: Diagnosis not present

## 2024-08-18 DIAGNOSIS — N179 Acute kidney failure, unspecified: Secondary | ICD-10-CM | POA: Diagnosis present

## 2024-08-18 DIAGNOSIS — I130 Hypertensive heart and chronic kidney disease with heart failure and stage 1 through stage 4 chronic kidney disease, or unspecified chronic kidney disease: Secondary | ICD-10-CM | POA: Diagnosis present

## 2024-08-18 DIAGNOSIS — D696 Thrombocytopenia, unspecified: Secondary | ICD-10-CM | POA: Diagnosis present

## 2024-08-18 DIAGNOSIS — E1122 Type 2 diabetes mellitus with diabetic chronic kidney disease: Secondary | ICD-10-CM | POA: Diagnosis present

## 2024-08-18 DIAGNOSIS — G4733 Obstructive sleep apnea (adult) (pediatric): Secondary | ICD-10-CM | POA: Diagnosis present

## 2024-08-18 DIAGNOSIS — M109 Gout, unspecified: Secondary | ICD-10-CM | POA: Diagnosis present

## 2024-08-18 DIAGNOSIS — M7989 Other specified soft tissue disorders: Secondary | ICD-10-CM | POA: Diagnosis not present

## 2024-08-18 DIAGNOSIS — R627 Adult failure to thrive: Secondary | ICD-10-CM | POA: Diagnosis present

## 2024-08-18 DIAGNOSIS — Z7189 Other specified counseling: Secondary | ICD-10-CM | POA: Diagnosis not present

## 2024-08-18 DIAGNOSIS — I5031 Acute diastolic (congestive) heart failure: Secondary | ICD-10-CM | POA: Diagnosis not present

## 2024-08-18 DIAGNOSIS — I5033 Acute on chronic diastolic (congestive) heart failure: Secondary | ICD-10-CM | POA: Diagnosis present

## 2024-08-18 DIAGNOSIS — Z955 Presence of coronary angioplasty implant and graft: Secondary | ICD-10-CM

## 2024-08-18 DIAGNOSIS — M25511 Pain in right shoulder: Secondary | ICD-10-CM | POA: Diagnosis not present

## 2024-08-18 DIAGNOSIS — Z83438 Family history of other disorder of lipoprotein metabolism and other lipidemia: Secondary | ICD-10-CM

## 2024-08-18 DIAGNOSIS — Z9049 Acquired absence of other specified parts of digestive tract: Secondary | ICD-10-CM

## 2024-08-18 DIAGNOSIS — Z87891 Personal history of nicotine dependence: Secondary | ICD-10-CM

## 2024-08-18 DIAGNOSIS — I4819 Other persistent atrial fibrillation: Secondary | ICD-10-CM | POA: Diagnosis present

## 2024-08-18 DIAGNOSIS — E1165 Type 2 diabetes mellitus with hyperglycemia: Secondary | ICD-10-CM | POA: Diagnosis present

## 2024-08-18 DIAGNOSIS — N189 Chronic kidney disease, unspecified: Secondary | ICD-10-CM | POA: Diagnosis not present

## 2024-08-18 DIAGNOSIS — Z7982 Long term (current) use of aspirin: Secondary | ICD-10-CM

## 2024-08-18 DIAGNOSIS — Z7984 Long term (current) use of oral hypoglycemic drugs: Secondary | ICD-10-CM

## 2024-08-18 DIAGNOSIS — M19012 Primary osteoarthritis, left shoulder: Secondary | ICD-10-CM | POA: Diagnosis present

## 2024-08-18 DIAGNOSIS — B9561 Methicillin susceptible Staphylococcus aureus infection as the cause of diseases classified elsewhere: Secondary | ICD-10-CM | POA: Diagnosis present

## 2024-08-18 DIAGNOSIS — W19XXXA Unspecified fall, initial encounter: Secondary | ICD-10-CM

## 2024-08-18 DIAGNOSIS — Z7901 Long term (current) use of anticoagulants: Secondary | ICD-10-CM | POA: Diagnosis not present

## 2024-08-18 DIAGNOSIS — N184 Chronic kidney disease, stage 4 (severe): Secondary | ICD-10-CM | POA: Diagnosis not present

## 2024-08-18 DIAGNOSIS — I2489 Other forms of acute ischemic heart disease: Secondary | ICD-10-CM | POA: Diagnosis present

## 2024-08-18 DIAGNOSIS — Z794 Long term (current) use of insulin: Secondary | ICD-10-CM | POA: Diagnosis not present

## 2024-08-18 DIAGNOSIS — I2510 Atherosclerotic heart disease of native coronary artery without angina pectoris: Secondary | ICD-10-CM | POA: Diagnosis present

## 2024-08-18 DIAGNOSIS — N1832 Chronic kidney disease, stage 3b: Secondary | ICD-10-CM | POA: Diagnosis present

## 2024-08-18 DIAGNOSIS — Z1152 Encounter for screening for COVID-19: Secondary | ICD-10-CM | POA: Diagnosis not present

## 2024-08-18 DIAGNOSIS — E039 Hypothyroidism, unspecified: Secondary | ICD-10-CM | POA: Diagnosis present

## 2024-08-18 DIAGNOSIS — I25118 Atherosclerotic heart disease of native coronary artery with other forms of angina pectoris: Secondary | ICD-10-CM | POA: Diagnosis not present

## 2024-08-18 DIAGNOSIS — M25412 Effusion, left shoulder: Secondary | ICD-10-CM | POA: Diagnosis not present

## 2024-08-18 DIAGNOSIS — Z7902 Long term (current) use of antithrombotics/antiplatelets: Secondary | ICD-10-CM

## 2024-08-18 DIAGNOSIS — Z66 Do not resuscitate: Secondary | ICD-10-CM | POA: Diagnosis present

## 2024-08-18 DIAGNOSIS — M795 Residual foreign body in soft tissue: Secondary | ICD-10-CM | POA: Diagnosis present

## 2024-08-18 DIAGNOSIS — R54 Age-related physical debility: Secondary | ICD-10-CM | POA: Diagnosis present

## 2024-08-18 DIAGNOSIS — I10 Essential (primary) hypertension: Secondary | ICD-10-CM | POA: Diagnosis present

## 2024-08-18 DIAGNOSIS — K219 Gastro-esophageal reflux disease without esophagitis: Secondary | ICD-10-CM | POA: Diagnosis present

## 2024-08-18 DIAGNOSIS — I272 Pulmonary hypertension, unspecified: Secondary | ICD-10-CM | POA: Diagnosis present

## 2024-08-18 DIAGNOSIS — Z79899 Other long term (current) drug therapy: Secondary | ICD-10-CM

## 2024-08-18 DIAGNOSIS — J90 Pleural effusion, not elsewhere classified: Secondary | ICD-10-CM | POA: Diagnosis not present

## 2024-08-18 LAB — COMPREHENSIVE METABOLIC PANEL WITH GFR
ALT: 18 U/L (ref 0–44)
AST: 31 U/L (ref 15–41)
Albumin: 3.4 g/dL — ABNORMAL LOW (ref 3.5–5.0)
Alkaline Phosphatase: 134 U/L — ABNORMAL HIGH (ref 38–126)
Anion gap: 12 (ref 5–15)
BUN: 69 mg/dL — ABNORMAL HIGH (ref 8–23)
CO2: 30 mmol/L (ref 22–32)
Calcium: 11.2 mg/dL — ABNORMAL HIGH (ref 8.9–10.3)
Chloride: 94 mmol/L — ABNORMAL LOW (ref 98–111)
Creatinine, Ser: 2.53 mg/dL — ABNORMAL HIGH (ref 0.61–1.24)
GFR, Estimated: 26 mL/min — ABNORMAL LOW
Glucose, Bld: 203 mg/dL — ABNORMAL HIGH (ref 70–99)
Potassium: 4.9 mmol/L (ref 3.5–5.1)
Sodium: 136 mmol/L (ref 135–145)
Total Bilirubin: 2.5 mg/dL — ABNORMAL HIGH (ref 0.0–1.2)
Total Protein: 7.4 g/dL (ref 6.5–8.1)

## 2024-08-18 LAB — CBC WITH DIFFERENTIAL/PLATELET
Abs Immature Granulocytes: 0.21 K/uL — ABNORMAL HIGH (ref 0.00–0.07)
Basophils Absolute: 0 K/uL (ref 0.0–0.1)
Basophils Relative: 0 %
Eosinophils Absolute: 0 K/uL (ref 0.0–0.5)
Eosinophils Relative: 0 %
HCT: 27.4 % — ABNORMAL LOW (ref 39.0–52.0)
Hemoglobin: 8.6 g/dL — ABNORMAL LOW (ref 13.0–17.0)
Immature Granulocytes: 1 %
Lymphocytes Relative: 2 %
Lymphs Abs: 0.3 K/uL — ABNORMAL LOW (ref 0.7–4.0)
MCH: 31.4 pg (ref 26.0–34.0)
MCHC: 31.4 g/dL (ref 30.0–36.0)
MCV: 100 fL (ref 80.0–100.0)
Monocytes Absolute: 0.6 K/uL (ref 0.1–1.0)
Monocytes Relative: 4 %
Neutro Abs: 15.7 K/uL — ABNORMAL HIGH (ref 1.7–7.7)
Neutrophils Relative %: 93 %
Platelets: 86 K/uL — ABNORMAL LOW (ref 150–400)
RBC: 2.74 MIL/uL — ABNORMAL LOW (ref 4.22–5.81)
RDW: 16.8 % — ABNORMAL HIGH (ref 11.5–15.5)
Smear Review: NORMAL
WBC: 16.9 K/uL — ABNORMAL HIGH (ref 4.0–10.5)
nRBC: 0 % (ref 0.0–0.2)

## 2024-08-18 LAB — RESP PANEL BY RT-PCR (RSV, FLU A&B, COVID)  RVPGX2
Influenza A by PCR: NEGATIVE
Influenza B by PCR: NEGATIVE
Resp Syncytial Virus by PCR: NEGATIVE
SARS Coronavirus 2 by RT PCR: NEGATIVE

## 2024-08-18 LAB — TROPONIN T, HIGH SENSITIVITY
Troponin T High Sensitivity: 167 ng/L (ref 0–19)
Troponin T High Sensitivity: 159 ng/L (ref 0–19)

## 2024-08-18 LAB — MAGNESIUM: Magnesium: 2 mg/dL (ref 1.7–2.4)

## 2024-08-18 LAB — FOLATE: Folate: 12.3 ng/mL

## 2024-08-18 LAB — PRO BRAIN NATRIURETIC PEPTIDE: Pro Brain Natriuretic Peptide: 28444 pg/mL — ABNORMAL HIGH

## 2024-08-18 MED ORDER — INSULIN ASPART 100 UNIT/ML IJ SOLN
0.0000 [IU] | Freq: Three times a day (TID) | INTRAMUSCULAR | Status: DC
  Administered 2024-08-19: 2 [IU] via SUBCUTANEOUS
  Administered 2024-08-19: 5 [IU] via SUBCUTANEOUS
  Administered 2024-08-19: 8 [IU] via SUBCUTANEOUS
  Administered 2024-08-20: 3 [IU] via SUBCUTANEOUS
  Administered 2024-08-20: 8 [IU] via SUBCUTANEOUS
  Administered 2024-08-21: 2 [IU] via SUBCUTANEOUS
  Administered 2024-08-21: 3 [IU] via SUBCUTANEOUS
  Administered 2024-08-21: 5 [IU] via SUBCUTANEOUS
  Filled 2024-08-18: qty 3
  Filled 2024-08-18 (×2): qty 2
  Filled 2024-08-18 (×2): qty 8
  Filled 2024-08-18: qty 3
  Filled 2024-08-18 (×2): qty 5

## 2024-08-18 MED ORDER — FUROSEMIDE 10 MG/ML IJ SOLN
120.0000 mg | Freq: Once | INTRAVENOUS | Status: DC
  Filled 2024-08-18: qty 12

## 2024-08-18 MED ORDER — HYDROMORPHONE HCL 1 MG/ML IJ SOLN
0.5000 mg | INTRAMUSCULAR | Status: DC | PRN
  Administered 2024-08-18 – 2024-08-20 (×6): 0.5 mg via INTRAVENOUS
  Filled 2024-08-18 (×6): qty 0.5

## 2024-08-18 MED ORDER — SODIUM CHLORIDE 0.9% FLUSH
3.0000 mL | Freq: Two times a day (BID) | INTRAVENOUS | Status: DC
  Administered 2024-08-18 – 2024-08-23 (×9): 3 mL via INTRAVENOUS

## 2024-08-18 MED ORDER — HYDROMORPHONE HCL 2 MG PO TABS
1.0000 mg | ORAL_TABLET | ORAL | Status: DC | PRN
  Administered 2024-08-20 (×3): 1 mg via ORAL
  Filled 2024-08-18 (×3): qty 1

## 2024-08-18 MED ORDER — ACETAMINOPHEN 325 MG PO TABS: 650.0000 mg | ORAL_TABLET | Freq: Four times a day (QID) | ORAL | Status: DC | PRN

## 2024-08-18 MED ORDER — CEFAZOLIN SODIUM-DEXTROSE 2-4 GM/100ML-% IV SOLN
2.0000 g | Freq: Two times a day (BID) | INTRAVENOUS | Status: DC
  Administered 2024-08-18 – 2024-08-21 (×7): 2 g via INTRAVENOUS
  Filled 2024-08-18 (×11): qty 100

## 2024-08-18 MED ORDER — HEPARIN SODIUM (PORCINE) 5000 UNIT/ML IJ SOLN
5000.0000 [IU] | Freq: Three times a day (TID) | INTRAMUSCULAR | Status: DC
  Administered 2024-08-18 – 2024-08-20 (×5): 5000 [IU] via SUBCUTANEOUS
  Filled 2024-08-18 (×5): qty 1

## 2024-08-18 MED ORDER — ONDANSETRON HCL 4 MG PO TABS: 4.0000 mg | ORAL_TABLET | Freq: Four times a day (QID) | ORAL | Status: DC | PRN

## 2024-08-18 MED ORDER — INSULIN ASPART 100 UNIT/ML IJ SOLN
0.0000 [IU] | Freq: Every day | INTRAMUSCULAR | Status: DC
  Administered 2024-08-19: 4 [IU] via SUBCUTANEOUS
  Filled 2024-08-18: qty 4
  Filled 2024-08-18: qty 2

## 2024-08-18 MED ORDER — FUROSEMIDE 10 MG/ML IJ SOLN
60.0000 mg | Freq: Two times a day (BID) | INTRAMUSCULAR | Status: DC
  Administered 2024-08-18 – 2024-08-19 (×3): 60 mg via INTRAVENOUS
  Filled 2024-08-18 (×3): qty 8

## 2024-08-18 MED ORDER — ONDANSETRON HCL 4 MG/2ML IJ SOLN: 4.0000 mg | Freq: Four times a day (QID) | INTRAMUSCULAR | Status: DC | PRN

## 2024-08-18 MED ORDER — SENNOSIDES-DOCUSATE SODIUM 8.6-50 MG PO TABS: 1.0000 | ORAL_TABLET | Freq: Every evening | ORAL | Status: DC | PRN

## 2024-08-18 MED ORDER — ACETAMINOPHEN 650 MG RE SUPP: 650.0000 mg | Freq: Four times a day (QID) | RECTAL | Status: DC | PRN

## 2024-08-18 NOTE — ED Notes (Signed)
 Pt changed into new brief. Urinated a small amount in urinal, amber in color.

## 2024-08-18 NOTE — ED Notes (Signed)
 This RN and [facility] EDT drew blood cultures on patient. Only one set was able to be obtained due to patient being a hard stick.

## 2024-08-18 NOTE — ED Triage Notes (Signed)
 Pt comes from home via [facility] for a slide out of bed and bilateral leg swelling. Was d/c here on Sat. Pt has  crackles in lungs per EMS. Pt denies pain and reports mild SOB. EMS vitals: 99.0 Temp BP 142/56\ HR 99 3L [place] (baseline) 95%

## 2024-08-18 NOTE — H&P (Signed)
 " History and Physical    [name] [name] DOB: [birth year] DOA: 08/18/2024  DOS: the patient was seen and examined on 08/18/2024  PCP: [name], MD   Patient coming from: Home  I have personally briefly reviewed patient's old medical records in [facility] and CareEverywhere  HPI:   [name] is a 79 y.o. year old male with medical history of hypertension, hyperlipidemia complicated by CAD status post CABG, type 2 diabetes, CHF (EF 60-65%) and 02/2024, CKD 3B, and severe pulmonary hypertension presenting to the ED with increasing weakness, and dyspnea.  Patient with multiple recent admissions with the most recent admission from 08/10/2024 - 08/15/2024 for acute heart failure exacerbation.  States he has been weak and having difficulty with ambulation.  He is also having pain in his hand along with his shoulders.  He denies any URI symptoms but states his breathing has worsened since leaving the hospital.  Denies any fevers or chills.  On arrival to the ED patient was noted to be HDS stable.  Lab work and imaging obtained.  CBC with moderate leukocytosis at 16.9, hemoglobin at baseline at 8.6, thrombocytopenia that is lower than recent platelet count.  CMP with moderate hyperglycemia, AKI on CKD 3B, hypercalcemia at 11.2, elevated ALP and bilirubin level.  Respiratory panel negative for COVID, flu, RSV.  Troponin mildly elevated but downtrending.  proBNP significantly elevated from 8 days ago and has doubled since then going from 15k to 28k.  Chest x-ray with diffuse interstitial prominence concerning for edema along with moderate right pleural effusion and small left pleural effusion. TRH contacted for admission.  Review of Systems: As mentioned in the history of present illness. All other systems reviewed and are negative.   Past Medical History:  Diagnosis Date   (HFpEF) heart failure with preserved ejection fraction (HCC) 07/25/2016   a.)TTE 07/25/16: EF >55,  triv-mild pan regur, mild AS, G2DD; b.)TTE 09/02/16: EF >55, triv TR, mild AS; c.)TTE 11/05/16: EF >55, LAE, triv-mild pan regur, mild AS, G2DD; d.)TTE 01/21/20: EF >55, mod LVH, LAE/RVE, triv-mild AR/PR/TR, mod MR, mild AS, G2DD; e.)TTE 09/24/22: EF>55, sev LVH, sev LAE, RAE, mild PR, mod AR/MR/TR, mild-mod AS, RVSP 74.7; f.)TTE 12/23/22: EF 60-65, LAE, mild-mod MR/TR, mild AS   Abnormal urine 05/01/2023   Anemia    Aortic atherosclerosis    Aortic stenosis 07/25/2016   a.) TTE 07/25/2016: mild (MPG 13.5); b.) TTE 09/02/2016: mild (MPG 16); c.) TTE 11/05/2016: mild (MPG 9); d.) TTE 01/21/2020: mild (MPG 11.7); e.) TTE 09/24/2022: mild-mod (MPG 20.3); f.) TTE 12/23/2022: mild (MPG 15)   Atrial fibrillation (HCC)    a.) CHA2DS2VASc = 6 (age x2, CHF, HTN, vascular disease history, T2DM);  b.) rate/rhythm maintained on oral labetalol ; chronically anticoagulated with apixaban  + clopidogrel    AV block, 1st degree    B12 deficiency    CKD (chronic kidney disease), stage III (HCC)    Colon polyps    Congestive heart failure (HCC) 05/01/2023   Coronary artery disease 08/16/2016   a.) s/p 3v CABG 08/30/2016; b.) s/p PCI 11/26/2016 (DES x 1 oSVG-RCA); c.) s/p PCI 12/17/2016 (DES x 5 --> mLCx x2, dLCx, pLAD, dLAD); c.) s/p PCI 06/09/2020 (DES x1 --> ISR oSVG-PDA)   Cyst of kidney, acquired 07/03/2023   Dyspnea    Gastroesophageal reflux disease 05/01/2023   GERD (gastroesophageal reflux disease)    Heart murmur    Hepatic steatosis    History of 2019 novel coronavirus disease (COVID-19) 09/02/2020  a.) Tx'd with remdesivir    History of blood transfusion    History of GI bleed 09/02/2020   a.) admitted to [facility] 09/02/2020 - 09/08/2020   History of heart artery stent 11/26/2016   TOTAL of 7 stents: a.) 11/26/2016 --> 3.5 x 22 mm Resolute Integrity oSVG-RCA; b.) 12/17/2016: 3.0 x 12 mm Xience Alpine dLCx, 3.5 x 22 mm Resolute Onyx mLCx, 3.5 x 18 mm Resolute Onyx mLCx, 3.0 x 26 mm Resolute Onyx pLAD, 2.0 x 26  mm Resolute Onyx dLAD; c.) 06/09/2020 --> 2.5 x 18 mm Resolute Onyx ISR oSVG-PDA   History of partial colectomy 08/12/2012   a.) large gastric hyperplastic polyp removal   HLD (hyperlipidemia)    Hypertension    LBBB (left bundle branch block)    Long term current use of anticoagulant    a.) apixaban    Long term current use of antithrombotics/antiplatelets    a.) clopidogrel    Male hypogonadism    a.) on exogenous TRT (1.62% gel)   Mild cardiomegaly    Mild gynecomastia (bilateral)    Nephrolithiasis    OSA (obstructive sleep apnea)    a.) does not utilize nocturnal PAP therapy   Personal history of surgery to heart and great vessels, presenting hazards to health 05/01/2023   Proteinuria, unspecified 08/01/2023   Pulmonary hypertension (HCC) 09/24/2022   a.) TTE 09/24/2022: RVSP 74.7; b.) TTE 12/23/2022: RVSP 31.3   RBBB (right bundle branch block with left anterior fascicular block)    Right inguinal hernia    Right thyroid  nodule    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   a.) LIMA-LAD, SVG-PDA, SVG-OM3   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/01/2023   Type 2 diabetes mellitus treated with insulin  (HCC)    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   Umbilical hernia    Unstable angina (HCC)    Vitamin D  deficiency     Past Surgical History:  Procedure Laterality Date   ANOMALOUS PULMONARY VENOUS RETURN REPAIR, TOTAL  2025   APPENDECTOMY     CARDIAC CATHETERIZATION Left 08/16/2016   Procedure: Left Heart Cath and Coronary Angiography;  Surgeon: Cara JONETTA Lovelace, MD;  Location: [facility] INVASIVE CV LAB;  Service: Cardiovascular;  Laterality: Left;   COLONOSCOPY N/A 09/07/2020   Procedure: COLONOSCOPY;  Surgeon: Janalyn Keene NOVAK, MD;  Location: [facility] ENDOSCOPY;  Service: Endoscopy;  Laterality: N/A;   COLONOSCOPY N/A 10/19/2023   Procedure: COLONOSCOPY;  Surgeon: Jinny Carmine, MD;  Location: [facility] ENDOSCOPY;  Service: Endoscopy;  Laterality: N/A;    COLONOSCOPY WITH PROPOFOL  N/A 05/17/2021   Procedure: COLONOSCOPY WITH PROPOFOL ;  Surgeon: Janalyn Keene NOVAK, MD;  Location: [facility] ENDOSCOPY;  Service: Endoscopy;  Laterality: N/A;   CORONARY ANGIOPLASTY WITH STENT PLACEMENT Left 11/26/2016   Procedure: CORONARY ANGIOPLASTY WITH STENT PLACEMENT; Location: [facility]; Surgeon: Alm Dais, MD   CORONARY ARTERY BYPASS GRAFT N/A 08/30/2016   Procedure: CORONARY ARTERY BYPASS GRAFT; Location: [facility]; Surgeon: Juliene Pouch, MD   CORONARY STENT INTERVENTION N/A 06/09/2020   Procedure: CORONARY STENT INTERVENTION;  Surgeon: Lovelace Cara JONETTA, MD;  Location: [facility];  Service: Cardiovascular;  Laterality: N/A;   CORONARY STENT INTERVENTION Left 12/17/2016   Procedure: CORONARY STENT INTERVENTION; Location: [facility]; Surgeon: Alm Dais, MD   CORONARY STENT INTERVENTION  03/2024   ENDOSCOPIC MUCOSAL RESECTION N/A 09/22/2020   Procedure: ENDOSCOPIC MUCOSAL RESECTION;  Surgeon: [name] Aloha Raddle., MD;  Location: [facility] ENDOSCOPY;  Service: Gastroenterology;  Laterality: N/A;   ESOPHAGOGASTRODUODENOSCOPY N/A  09/07/2020   Procedure: ESOPHAGOGASTRODUODENOSCOPY (EGD);  Surgeon: Janalyn Keene NOVAK, MD;  Location: [facility] ENDOSCOPY;  Service: Endoscopy;  Laterality: N/A;   ESOPHAGOGASTRODUODENOSCOPY N/A 10/19/2023   Procedure: EGD (ESOPHAGOGASTRODUODENOSCOPY);  Surgeon: Jinny Carmine, MD;  Location: [facility] ENDOSCOPY;  Service: Endoscopy;  Laterality: N/A;   ESOPHAGOGASTRODUODENOSCOPY (EGD) WITH PROPOFOL  N/A 09/22/2020   Procedure: ESOPHAGOGASTRODUODENOSCOPY (EGD) WITH PROPOFOL ;  Surgeon: [name] Aloha Raddle., MD;  Location: [facility] ENDOSCOPY;  Service: Gastroenterology;  Laterality: N/A;   ESOPHAGOGASTRODUODENOSCOPY (EGD) WITH PROPOFOL  N/A 08/26/2023   Procedure: ESOPHAGOGASTRODUODENOSCOPY (EGD) WITH PROPOFOL ;  Surgeon: Jinny Carmine, MD;  Location: [facility] ENDOSCOPY;  Service: Endoscopy;  Laterality: N/A;   FRACTURE SURGERY     HEMOSTASIS CLIP PLACEMENT  09/22/2020    Procedure: HEMOSTASIS CLIP PLACEMENT;  Surgeon: [name] Aloha Raddle., MD;  Location: [facility] ENDOSCOPY;  Service: Gastroenterology;;   HEMOSTASIS CONTROL  09/22/2020   Procedure: HEMOSTASIS CONTROL;  Surgeon: [name] Aloha Raddle., MD;  Location: [facility] ENDOSCOPY;  Service: Gastroenterology;;   INSERTION OF MESH  02/19/2023   Procedure: INSERTION OF MESH;  Surgeon: Desiderio Schanz, MD;  Location: [facility] ORS;  Service: General;;  umbilical   LAPAROSCOPIC PARTIAL RIGHT COLECTOMY Right 08/12/2012   Procedure: LAPAROSCOPIC PARTIAL RIGHT COLECTOMY; Location: [facility]; Surgeon: Unknown Sharps, MD   LEFT HEART CATH AND CORONARY ANGIOGRAPHY Left 06/09/2020   Procedure: LEFT HEART CATH AND CORONARY ANGIOGRAPHY;  Surgeon: Florencio Cara BIRCH, MD;  Location: [facility];  Service: Cardiovascular;  Laterality: Left;   LEFT HEART CATH AND CORS/GRAFTS ANGIOGRAPHY Left 11/20/2016   Procedure: Left Heart Cath and Cors/Grafts Angiography;  Surgeon: Cara BIRCH Florencio, MD;  Location: [facility];  Service: Cardiovascular;  Laterality: Left;   POLYPECTOMY  09/22/2020   Procedure: POLYPECTOMY;  Surgeon: [name] Aloha Raddle., MD;  Location: [facility] ENDOSCOPY;  Service: Gastroenterology;;   POLYPECTOMY  10/19/2023   Procedure: POLYPECTOMY;  Surgeon: Jinny Carmine, MD;  Location: [facility] ENDOSCOPY;  Service: Endoscopy;;   RIGHT HEART CATH Right 07/01/2024   Procedure: RIGHT HEART CATH;  Surgeon: Ammon Blunt, MD;  Location: [facility];  Service: Cardiovascular;  Laterality: Right;   SUBMUCOSAL LIFTING INJECTION  09/22/2020   Procedure: SUBMUCOSAL LIFTING INJECTION;  Surgeon: [name] Aloha Raddle., MD;  Location: [facility] ENDOSCOPY;  Service: Gastroenterology;;   UMBILICAL HERNIA REPAIR N/A 02/19/2023   Procedure: HERNIA REPAIR UMBILICAL ADULT, open;  Surgeon: Desiderio Schanz, MD;  Location: [facility] ORS;  Service: General;  Laterality: N/A;   XI ROBOTIC ASSISTED INGUINAL HERNIA REPAIR WITH MESH Right 02/19/2023    Procedure: XI ROBOTIC ASSISTED INGUINAL HERNIA REPAIR WITH MESH;  Surgeon: Desiderio Schanz, MD;  Location: [facility] ORS;  Service: General;  Laterality: Right;     Allergies[1]  Family History  Problem Relation Age of Onset   Hyperlipidemia Mother    Heart disease Mother    Hypertension Father     Prior to Admission medications  Medication Sig Start Date End Date Taking? Authorizing Provider  amLODipine  (NORVASC ) 10 MG tablet Take 10 mg by mouth daily. 03/23/24 03/23/25  [provider]  aspirin  EC 81 MG tablet Take 81 mg by mouth daily. 04/02/24 04/02/25  [provider]  atorvastatin  (LIPITOR ) 80 MG tablet Take 80 mg by mouth.  Take 80 mg by mouth in the morning. 12/21/23 12/20/24  [provider]  bisacodyl  (DULCOLAX) 5 MG EC tablet Take 5 mg by mouth as needed for moderate constipation.    [provider]  carvedilol  (COREG ) 3.125 MG tablet Take 1 tablet (3.125 mg total) by  mouth 2 (two) times daily with a meal. 03/04/24   Caleen Qualia, MD  cyanocobalamin  (VITAMIN B12) 1000 MCG tablet Take 1,000 mcg by mouth daily.    [provider]  dexlansoprazole (DEXILANT) 60 MG capsule Take 1 capsule by mouth daily. 12/25/21   [provider]  empagliflozin  (JARDIANCE ) 10 MG TABS tablet Take 1 tablet (10 mg total) by mouth daily before breakfast. 08/29/23   Agbata, Tochukwu, MD  Ferrous Sulfate  (IRON ) 325 (65 Fe) MG TABS Take 1 tablet by mouth daily.    [provider]  isosorbide  mononitrate (IMDUR ) 120 MG 24 hr tablet Take 120 mg by mouth daily. 03/23/24 03/23/25  [provider]  lactulose  (CHRONULAC ) 10 GM/15ML solution Taking 15-20 cc by mouth 3 to 4 times a day for constiption Patient taking differently: Taking 15-20 cc by mouth 3 to 4 times a day for constiption as needed 10/08/23   Therisa Bi, MD  nitroGLYCERIN  (NITROSTAT ) 0.4 MG SL tablet Place 0.4 mg under the tongue every 5 (five) minutes as needed for chest pain.    [provider]  torsemide  (DEMADEX ) 20 MG tablet Take 3 tablets (60 mg total) by mouth daily. 08/15/24   Jens Durand, MD    Social History:  reports that he quit smoking about 26 years ago. His smoking use included cigarettes. He has never been exposed to tobacco smoke. He has never used smokeless tobacco. He reports that he does not drink alcohol and does not use drugs.    Physical Exam: Vitals:   08/18/24 1850 08/18/24 1948 08/18/24 2033 08/18/24 2037  BP:   (!) 118/59   Pulse:   88   Resp:   (!) 22   Temp:    98.4 F (36.9 C)  TempSrc:   Oral   SpO2:  93% 95%   Weight: 81 kg   86.2 kg  Height:    6' 1 (1.854 m)    Gen: NAD HENT: NCAT CV: normal heart sounds, JVD present Lung: Bibasilar rales Abd: No TTP, normal bowel sounds MSK: No asymmetry, good bulk and tone Neuro: alert and oriented   Labs on Admission: I have personally reviewed following labs and imaging studies  CBC: Recent Labs  Lab 08/12/24 0408 08/13/24 0359 08/13/24 1417 08/15/24 0427 08/18/24 1641  WBC 5.7 5.9  --  11.3* 16.9*  NEUTROABS  --   --   --   --  15.7*  HGB 7.1* 7.1* 8.0* 8.1* 8.6*  HCT 22.3* 22.5* 24.6* 25.6* 27.4*  MCV 101.8* 101.8*  --  101.2* 100.0  PLT 111* 94*  --  112* 86*   Basic Metabolic Panel: Recent Labs  Lab 08/12/24 0408 08/13/24 0359 08/14/24 0448 08/15/24 0427 08/18/24 1641  NA 139 137 139 139 136  K 4.3 4.3 4.6 3.9 4.9  CL 100 99 99 99 94*  CO2 30 31 34* 34* 30  GLUCOSE 153* 155* 168* 153* 203*  BUN 42* 43* 46* 46* 69*  CREATININE 1.78* 1.85* 1.95* 1.87* 2.53*  CALCIUM  10.0 10.2 10.1 10.3 11.2*  MG  --  2.2 2.1  --  2.0  PHOS  --   --  3.3  --   --    GFR: Estimated Creatinine Clearance: 28.1 mL/min (A) (by C-G formula based on SCr of 2.53 mg/dL (H)). Liver Function Tests: Recent Labs  Lab 08/14/24 0448 08/18/24 1641  AST  --  31  ALT  --  18  ALKPHOS  --  134*  BILITOT  --  2.5*  PROT  --  7.4  ALBUMIN 3.5 3.4*   No results for input(s):  LIPASE, AMYLASE in the last 168 hours. No results for input(s): AMMONIA in the last 168 hours. Coagulation Profile: No results for input(s): INR, PROTIME in the last 168 hours. Cardiac Enzymes: No results for input(s): CKTOTAL, CKMB, CKMBINDEX, TROPONINI, TROPONINIHS in the last 168 hours. BNP (last 3 results) Recent Labs    08/22/23 0730 10/16/23 1415 03/02/24 1314  BNP 1,069.2* 942.6* 1,183.2*   HbA1C: No results for input(s): HGBA1C in the last 72 hours. CBG: No results for input(s): GLUCAP in the last 168 hours. Lipid Profile: No results for input(s): CHOL, HDL, LDLCALC, TRIG, CHOLHDL, LDLDIRECT in the last 72 hours. Thyroid  Function Tests: No results for input(s): TSH, T4TOTAL, FREET4, T3FREE, THYROIDAB in the last 72 hours. Anemia Panel: No results for input(s): VITAMINB12, FOLATE, FERRITIN, TIBC, IRON , RETICCTPCT in the last 72 hours. Urine analysis:    Component Value Date/Time   COLORURINE YELLOW (A) 08/11/2024 0521   APPEARANCEUR CLEAR (A) 08/11/2024 0521   LABSPEC 1.011 08/11/2024 0521   PHURINE 5.0 08/11/2024 0521   GLUCOSEU 150 (A) 08/11/2024 0521   HGBUR MODERATE (A) 08/11/2024 0521   BILIRUBINUR NEGATIVE 08/11/2024 0521   KETONESUR NEGATIVE 08/11/2024 0521   PROTEINUR 100 (A) 08/11/2024 0521   NITRITE NEGATIVE 08/11/2024 0521   LEUKOCYTESUR TRACE (A) 08/11/2024 0521    Radiological Exams on Admission: I have personally reviewed images DG Shoulder Left Result Date: 08/18/2024 EXAM: 1 VIEW(S) XRAY OF THE LEFT SHOULDER 08/18/2024 07:58:00 PM COMPARISON: None available. CLINICAL HISTORY: Shoulder pain, left. FINDINGS: BONES AND JOINTS: Glenohumeral joint is normally aligned. No acute fracture. No malalignment. 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 SOFT TISSUES: No abnormal calcifications. Visualized lung is unremarkable. IMPRESSION: 1. Mild osteoarthritis of the glenohumeral  and acromioclavicular joints. Electronically signed by: Elsie Gravely MD 08/18/2024 08:12 PM EST RP Workstation: [id number]   DG Hand 2 View Left Result Date: 08/18/2024 EXAM: 1 or 2 VIEW(S) XRAY OF THE LEFT HAND 08/18/2024 07:58:00 PM COMPARISON: None available. CLINICAL HISTORY: Pain FINDINGS: BONES AND JOINTS: Prominent degenerative changes in the interphalangeal joints, first carpometacarpal and metacarpophalangeal joints, radiocarpal, radioulnar, and intercarpal joints. Old healed fracture deformity of the distal radius. Subcortical cyst in the base of the 1st metacarpal, likely degenerative cyst. SOFT TISSUES: Soft tissue calcifications in and around the wrist likely representing synovial calcifications. Dorsal soft tissue swelling. Tiny metallic foreign body suggested in the soft tissues of the third finger adjacent to the middle phalanx. IMPRESSION: 1. Dorsal soft tissue swelling. 2. Tiny metallic foreign body suggested in the soft tissues of the third finger adjacent to the middle phalanx. 3. Prominent degenerative changes in the interphalangeal joints, first carpometacarpal and metacarpophalangeal joints, radiocarpal, radioulnar, and intercarpal joints. 4. Soft tissue calcifications in and around the wrist, likely representing synovial calcifications. 5. Old healed fracture deformity of the distal radius. Electronically signed by: Elsie Gravely MD 08/18/2024 08:11 PM EST RP Workstation: [id number]   DG Chest Portable 1 View Result Date: 08/18/2024 EXAM: 1 VIEW(S) XRAY OF THE CHEST 08/18/2024 04:53:00 PM COMPARISON: 08/10/2024 CLINICAL HISTORY: SOB FINDINGS: LUNGS AND PLEURA: Moderate right pleural effusion, stable compared to prior. Small left pleural effusion. Mild diffuse interstitial prominence. Bibasilar airspace opacities, likely compressive atelectasis. No pneumothorax. HEART AND MEDIASTINUM: Enlarged cardiomediastinal silhouette, unchanged. Median sternotomy and TAVR noted. Atherosclerotic  calcifications. BONES AND SOFT TISSUES: No acute osseous abnormality. IMPRESSION: 1. Moderate right pleural  effusion and small left pleural effusion. 2. Mild diffuse interstitial prominence and bibasilar airspace opacities, likely compressive atelectasis. Electronically signed by: Greig Pique MD 08/18/2024 05:32 PM EST RP Workstation: [id number]    EKG: My personal interpretation of EKG shows: Pending    Assessment/Plan Principal Problem:   Failure to thrive in adult Active Problems:   Acute heart failure with preserved ejection fraction (HCC)   Acute kidney injury superimposed on stage 3b chronic kidney disease (HCC)   CAD (coronary artery disease), s/p CABG x 3   Paroxysmal atrial fibrillation (HCC)   Anemia in chronic kidney disease   T2DM (type 2 diabetes mellitus) (HCC)   Essential hypertension   OSA (obstructive sleep apnea)   Hyperlipidemia associated with type 2 diabetes mellitus (HCC)   Moderate mitral regurgitation   Patient with multiple admissions for acute hypoxic respiratory failure coming in with worsening dyspnea and volume overload.   - Admit to progressive level of care - Start IV Furosemide  60 mg BID  - Trend BMP, follow mag (goal K>4 and Mag>2) - Strict I&Os - Daily Weights - Repeat echo given multiple admissions to ensure systolic function is intact.  Cardiology consult for further recommendations.  AKI on CKD 3B: Likely in the setting of venous congestion.  IV Lasix  as above.  Trend creatinine, renally dose medications and avoid nephrotoxic agents.  If declining with diuresis, will consult nephrology.   Left hand pain/swelling: There is some erythema of the left hand along with swelling.  DDx includes cellulitis versus gout versus DVT.  Will get plain radiography of the left hand along with Doppler of the left upper extremity.  Given leukocytosis, will initiate antibiotics for cellulitis.  Interval update: Left hand x ray showed diffuse swelling along with  concern for foreign metallic body on 3rd digit. Pt had pulse ox on this digit so discussed with radiologist who stated it is superficial small 2mm object. Recommended repeating  x ray after removing pulse ox and cleansing the hand thoroughly. Repeat x ray ordered with instructions sent to RN with confirmation.   Elevated LFTs: May be secondary to hepatic congestion in setting of volume overload.  Will trend.  Macrocytic anemia: At baseline.  Cardiology recommends hemoglobin goal greater than 8 and currently it is at 8.6.  Will get folic acid  and B12.  Hypertension: Continue home coreg  as it is low-dose.  Hold home amlodipine  given diuresis and normotensive blood pressure  Hyperlipidemia: Holding home statin given elevated LFTs.  Paroxysmal atrial fibrillation: In sinus rhythm.  Will continue home Eliquis .  Will also continue home Coreg  as it is low-dose.  Type 2 diabetes mellitus: Holding home Jardiance  given AKI, will place on SSI here.  Generalized weakness: PT and OT consulted.  L Shoulder Pain: pt with left shoulder pain with ROM. 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 Obtained plain radiography of this left shoulder.   VTE prophylaxis:  Eliquis   Diet: Heart healthy Code Status:  Full Code Telemetry:  Admission status: Inpatient, Progressive Patient is from: Home Anticipated d/c is to: Home Anticipated d/c is in: 3-4 days   Family Communication: Updated at bedside  Consults called: Cardiology   Severity of Illness: The appropriate patient status for this patient is INPATIENT. Inpatient status is judged to be reasonable and necessary in order to provide the required intensity of service to ensure the patient's safety. The patient's presenting symptoms, physical exam findings, and initial radiographic and laboratory data in the context of their chronic comorbidities  is felt to place them at high risk for further clinical deterioration. Furthermore, it is not anticipated that the  patient will be medically stable for discharge from the hospital within 2 midnights of admission.   * I certify that at the point of admission it is my clinical judgment that the patient will require inpatient hospital care spanning beyond 2 midnights from the point of admission due to high intensity of service, high risk for further deterioration and high frequency of surveillance required.[name] Jerry Bathe, MD [facility]      [1] No Known Allergies  "

## 2024-08-18 NOTE — ED Notes (Signed)
 CCMD called to admit patient to monitor

## 2024-08-18 NOTE — ED Provider Notes (Signed)
 "  [facility] Provider Note    Event Date/Time   First MD Initiated Contact with Patient 08/18/24 [id number]     (approximate)   History   Chief Complaint: Fall   HPI  [name] is a 79 y.o. male with a tree of CHF, atrial fibrillation on apixaban , CKD, diabetes who was brought to the ED due to generalized weakness for the last few days since discharge home from the hospital 3 days ago.  Reports central chest pain, shortness of breath.  No fever or pleuritic symptoms.  Feels very fatigued and weak whenever he tries to stand up, is not able to move about at home or perform his ADLs due to severity of symptoms.        Past Medical History:  Diagnosis Date   (HFpEF) heart failure with preserved ejection fraction (HCC) 07/25/2016   a.)TTE 07/25/16: EF >55, triv-mild pan regur, mild AS, G2DD; b.)TTE 09/02/16: EF >55, triv TR, mild AS; c.)TTE 11/05/16: EF >55, LAE, triv-mild pan regur, mild AS, G2DD; d.)TTE 01/21/20: EF >55, mod LVH, LAE/RVE, triv-mild AR/PR/TR, mod MR, mild AS, G2DD; e.)TTE 09/24/22: EF>55, sev LVH, sev LAE, RAE, mild PR, mod AR/MR/TR, mild-mod AS, RVSP 74.7; f.)TTE 12/23/22: EF 60-65, LAE, mild-mod MR/TR, mild AS   Abnormal urine 05/01/2023   Anemia    Aortic atherosclerosis    Aortic stenosis 07/25/2016   a.) TTE 07/25/2016: mild (MPG 13.5); b.) TTE 09/02/2016: mild (MPG 16); c.) TTE 11/05/2016: mild (MPG 9); d.) TTE 01/21/2020: mild (MPG 11.7); e.) TTE 09/24/2022: mild-mod (MPG 20.3); f.) TTE 12/23/2022: mild (MPG 15)   Atrial fibrillation (HCC)    a.) CHA2DS2VASc = 6 (age x2, CHF, HTN, vascular disease history, T2DM);  b.) rate/rhythm maintained on oral labetalol ; chronically anticoagulated with apixaban  + clopidogrel    AV block, 1st degree    B12 deficiency    CKD (chronic kidney disease), stage III (HCC)    Colon polyps    Congestive heart failure (HCC) 05/01/2023   Coronary artery disease 08/16/2016   a.) s/p 3v CABG 08/30/2016; b.) s/p  PCI 11/26/2016 (DES x 1 oSVG-RCA); c.) s/p PCI 12/17/2016 (DES x 5 --> mLCx x2, dLCx, pLAD, dLAD); c.) s/p PCI 06/09/2020 (DES x1 --> ISR oSVG-PDA)   Cyst of kidney, acquired 07/03/2023   Dyspnea    Gastroesophageal reflux disease 05/01/2023   GERD (gastroesophageal reflux disease)    Heart murmur    Hepatic steatosis    History of 2019 novel coronavirus disease (COVID-19) 09/02/2020   a.) Tx'd with remdesivir    History of blood transfusion    History of GI bleed 09/02/2020   a.) admitted to [facility] 09/02/2020 - 09/08/2020   History of heart artery stent 11/26/2016   TOTAL of 7 stents: a.) 11/26/2016 --> 3.5 x 22 mm Resolute Integrity oSVG-RCA; b.) 12/17/2016: 3.0 x 12 mm Xience Alpine dLCx, 3.5 x 22 mm Resolute Onyx mLCx, 3.5 x 18 mm Resolute Onyx mLCx, 3.0 x 26 mm Resolute Onyx pLAD, 2.0 x 26 mm Resolute Onyx dLAD; c.) 06/09/2020 --> 2.5 x 18 mm Resolute Onyx ISR oSVG-PDA   History of partial colectomy 08/12/2012   a.) large gastric hyperplastic polyp removal   HLD (hyperlipidemia)    Hypertension    LBBB (left bundle branch block)    Long term current use of anticoagulant    a.) apixaban    Long term current use of antithrombotics/antiplatelets    a.) clopidogrel    Male hypogonadism    a.) on  exogenous TRT (1.62% gel)   Mild cardiomegaly    Mild gynecomastia (bilateral)    Nephrolithiasis    OSA (obstructive sleep apnea)    a.) does not utilize nocturnal PAP therapy   Personal history of surgery to heart and great vessels, presenting hazards to health 05/01/2023   Proteinuria, unspecified 08/01/2023   Pulmonary hypertension (HCC) 09/24/2022   a.) TTE 09/24/2022: RVSP 74.7; b.) TTE 12/23/2022: RVSP 31.3   RBBB (right bundle branch block with left anterior fascicular block)    Right inguinal hernia    Right thyroid  nodule    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   a.) LIMA-LAD, SVG-PDA, SVG-OM3   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/01/2023   Type 2 diabetes mellitus treated with insulin   (HCC)    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   Umbilical hernia    Unstable angina (HCC)    Vitamin D  deficiency     Current Outpatient Rx   Order #: [id number] Class: Historical Med   Order #: [id number] Class: Historical Med   Order #: [id number] Class: Historical Med   Order #: [id number] Class: Historical Med   Order #: [id number] Class: Normal   Order #: [id number] Class: Historical Med   Order #: [id number] Class: Historical Med   Order #: [id number] Class: Normal   Order #: [id number] Class: Historical Med   Order #: [id number] Class: Historical Med   Order #: [id number] Class: Normal   Order #: [id number] Class: Historical Med   Order #: [id number] Class: No Print    Past Surgical History:  Procedure Laterality Date   ANOMALOUS PULMONARY VENOUS RETURN REPAIR, TOTAL  2025   APPENDECTOMY     CARDIAC CATHETERIZATION Left 08/16/2016   Procedure: Left Heart Cath and Coronary Angiography;  Surgeon: Cara JONETTA Lovelace, MD;  Location: [facility];  Service: Cardiovascular;  Laterality: Left;   COLONOSCOPY N/A 09/07/2020   Procedure: COLONOSCOPY;  Surgeon: Janalyn Keene NOVAK, MD;  Location: [facility] ENDOSCOPY;  Service: Endoscopy;  Laterality: N/A;   COLONOSCOPY N/A 10/19/2023   Procedure: COLONOSCOPY;  Surgeon: Jinny Carmine, MD;  Location: [facility] ENDOSCOPY;  Service: Endoscopy;  Laterality: N/A;   COLONOSCOPY WITH PROPOFOL  N/A 05/17/2021   Procedure: COLONOSCOPY WITH PROPOFOL ;  Surgeon: Janalyn Keene NOVAK, MD;  Location: [facility] ENDOSCOPY;  Service: Endoscopy;  Laterality: N/A;   CORONARY ANGIOPLASTY WITH STENT PLACEMENT Left 11/26/2016   Procedure: CORONARY ANGIOPLASTY WITH STENT PLACEMENT; Location: [facility]; Surgeon: Alm Dais, MD   CORONARY ARTERY BYPASS GRAFT N/A 08/30/2016   Procedure: CORONARY ARTERY BYPASS GRAFT; Location: [facility]; Surgeon: Juliene Pouch, MD   CORONARY STENT INTERVENTION N/A 06/09/2020   Procedure: CORONARY STENT INTERVENTION;  Surgeon:  Lovelace Cara JONETTA, MD;  Location: [facility];  Service: Cardiovascular;  Laterality: N/A;   CORONARY STENT INTERVENTION Left 12/17/2016   Procedure: CORONARY STENT INTERVENTION; Location: [facility]; Surgeon: Alm Dais, MD   CORONARY STENT INTERVENTION  03/2024   ENDOSCOPIC MUCOSAL RESECTION N/A 09/22/2020   Procedure: ENDOSCOPIC MUCOSAL RESECTION;  Surgeon: [name] Aloha Raddle., MD;  Location: [facility] ENDOSCOPY;  Service: Gastroenterology;  Laterality: N/A;   ESOPHAGOGASTRODUODENOSCOPY N/A 09/07/2020   Procedure: ESOPHAGOGASTRODUODENOSCOPY (EGD);  Surgeon: Janalyn Keene NOVAK, MD;  Location: [facility] ENDOSCOPY;  Service: Endoscopy;  Laterality: N/A;   ESOPHAGOGASTRODUODENOSCOPY N/A 10/19/2023   Procedure: EGD (ESOPHAGOGASTRODUODENOSCOPY);  Surgeon: Jinny Carmine, MD;  Location: [facility] ENDOSCOPY;  Service: Endoscopy;  Laterality: N/A;   ESOPHAGOGASTRODUODENOSCOPY (EGD) WITH PROPOFOL  N/A 09/22/2020   Procedure: ESOPHAGOGASTRODUODENOSCOPY (EGD) WITH PROPOFOL ;  Surgeon: [name] Aloha Raddle., MD;  Location: [facility] ENDOSCOPY;  Service: Gastroenterology;  Laterality: N/A;   ESOPHAGOGASTRODUODENOSCOPY (EGD) WITH PROPOFOL  N/A 08/26/2023   Procedure: ESOPHAGOGASTRODUODENOSCOPY (EGD) WITH PROPOFOL ;  Surgeon: Jinny Carmine, MD;  Location: [facility] ENDOSCOPY;  Service: Endoscopy;  Laterality: N/A;   FRACTURE SURGERY     HEMOSTASIS CLIP PLACEMENT  09/22/2020   Procedure: HEMOSTASIS CLIP PLACEMENT;  Surgeon: [name] Aloha Raddle., MD;  Location: [facility] ENDOSCOPY;  Service: Gastroenterology;;   HEMOSTASIS CONTROL  09/22/2020   Procedure: HEMOSTASIS CONTROL;  Surgeon: [name] Aloha Raddle., MD;  Location: [facility] ENDOSCOPY;  Service: Gastroenterology;;   INSERTION OF MESH  02/19/2023   Procedure: INSERTION OF MESH;  Surgeon: Desiderio Schanz, MD;  Location: [facility] ORS;  Service: General;;  umbilical   LAPAROSCOPIC PARTIAL RIGHT COLECTOMY Right 08/12/2012   Procedure: LAPAROSCOPIC PARTIAL RIGHT COLECTOMY; Location: [facility];  Surgeon: Unknown Sharps, MD   LEFT HEART CATH AND CORONARY ANGIOGRAPHY Left 06/09/2020   Procedure: LEFT HEART CATH AND CORONARY ANGIOGRAPHY;  Surgeon: Florencio Cara BIRCH, MD;  Location: [facility];  Service: Cardiovascular;  Laterality: Left;   LEFT HEART CATH AND CORS/GRAFTS ANGIOGRAPHY Left 11/20/2016   Procedure: Left Heart Cath and Cors/Grafts Angiography;  Surgeon: Cara BIRCH Florencio, MD;  Location: [facility];  Service: Cardiovascular;  Laterality: Left;   POLYPECTOMY  09/22/2020   Procedure: POLYPECTOMY;  Surgeon: [name] Aloha Raddle., MD;  Location: [facility] ENDOSCOPY;  Service: Gastroenterology;;   POLYPECTOMY  10/19/2023   Procedure: POLYPECTOMY;  Surgeon: Jinny Carmine, MD;  Location: [facility] ENDOSCOPY;  Service: Endoscopy;;   RIGHT HEART CATH Right 07/01/2024   Procedure: RIGHT HEART CATH;  Surgeon: Ammon Blunt, MD;  Location: [facility];  Service: Cardiovascular;  Laterality: Right;   SUBMUCOSAL LIFTING INJECTION  09/22/2020   Procedure: SUBMUCOSAL LIFTING INJECTION;  Surgeon: [name] Aloha Raddle., MD;  Location: [facility] ENDOSCOPY;  Service: Gastroenterology;;   UMBILICAL HERNIA REPAIR N/A 02/19/2023   Procedure: HERNIA REPAIR UMBILICAL ADULT, open;  Surgeon: Desiderio Schanz, MD;  Location: [facility] ORS;  Service: General;  Laterality: N/A;   XI ROBOTIC ASSISTED INGUINAL HERNIA REPAIR WITH MESH Right 02/19/2023   Procedure: XI ROBOTIC ASSISTED INGUINAL HERNIA REPAIR WITH MESH;  Surgeon: Desiderio Schanz, MD;  Location: [facility] ORS;  Service: General;  Laterality: Right;    Physical Exam   Triage Vital Signs: ED Triage Vitals [08/18/24 1635]  Encounter Vitals Group     BP      Girls Systolic BP Percentile      Girls Diastolic BP Percentile      Boys Systolic BP Percentile      Boys Diastolic BP Percentile      Pulse      Resp      Temp 98.6 F (37 C)     Temp Source Oral     SpO2      Weight      Height      Head Circumference      Peak Flow       Pain Score      Pain Loc      Pain Education      Exclude from Growth Chart     Most recent vital signs: Vitals:   08/18/24 1730 08/18/24 1830  BP: 134/61 125/63  Pulse: 92 91  Resp: (!) 29 (!) 30  Temp:    SpO2: 97% 95%    General: Awake, no distress.  CV:  Good peripheral perfusion.  Regular rate rhythm, symmetric distal pulses Resp:  Tachypnea heart rate 25.  Clear lungs without wheezing  or crackles Abd:  No distention.  Soft nontender Other:  2+ pitting edema bilateral lower extremities.  There is edema of the left hand as well without signs of trauma.   ED Results / Procedures / Treatments   Labs (all labs ordered are listed, but only abnormal results are displayed) Labs Reviewed  PRO BRAIN NATRIURETIC PEPTIDE - Abnormal; Notable for the following components:      Result Value   Pro Brain Natriuretic Peptide 28,444.0 (*)    All other components within normal limits  CBC WITH DIFFERENTIAL/PLATELET - Abnormal; Notable for the following components:   WBC 16.9 (*)    RBC 2.74 (*)    Hemoglobin 8.6 (*)    HCT 27.4 (*)    RDW 16.8 (*)    Platelets 86 (*)    Neutro Abs 15.7 (*)    Lymphs Abs 0.3 (*)    Abs Immature Granulocytes 0.21 (*)    All other components within normal limits  COMPREHENSIVE METABOLIC PANEL WITH GFR - Abnormal; Notable for the following components:   Chloride 94 (*)    Glucose, Bld 203 (*)    BUN 69 (*)    Creatinine, Ser 2.53 (*)    Calcium  11.2 (*)    Albumin 3.4 (*)    Alkaline Phosphatase 134 (*)    Total Bilirubin 2.5 (*)    GFR, Estimated 26 (*)    All other components within normal limits  TROPONIN T, HIGH SENSITIVITY - Abnormal; Notable for the following components:   Troponin T High Sensitivity 167 (*)    All other components within normal limits  RESP PANEL BY RT-PCR (RSV, FLU A&B, COVID)  RVPGX2  TROPONIN T, HIGH SENSITIVITY     EKG Interpreted by me Sinus rhythm rate of 91.  Left axis, right bundle branch block, no acute  ischemic changes.   RADIOLOGY Chest x-ray reviewed, no focal consolidation.  Radiology report reviewed noting multifocal opacities   PROCEDURES:  Procedures   MEDICATIONS ORDERED IN ED: Medications  furosemide  (LASIX ) injection 60 mg (has no administration in time range)     IMPRESSION / MDM / ASSESSMENT AND PLAN / ED COURSE  I reviewed the triage vital signs and the nursing notes.  DDx: Pneumonia, COVID, influenza, pulmonary edema, non-STEMI, acute decompensated heart failure, electrolyte derangement  Patient's presentation is most consistent with acute presentation with potential threat to life or bodily function.  Patient presents with shortness of breath, generalized weakness, has peripheral edema.  Possibly viral versus cardiopulmonary.  Will check labs, chest x-ray.   ----------------------------------------- 6:49 PM on 08/18/2024 ----------------------------------------- Troponin, Bnp elevated and with chest x-ray concerning for pulmonary edema, decompensated heart failure.  Also has AKI on CKD.  Case discussed with hospitalist for further evaluation and management.      FINAL CLINICAL IMPRESSION(S) / ED DIAGNOSES   Final diagnoses:  Acute on chronic congestive heart failure, unspecified heart failure type (HCC)  Stage 4 chronic kidney disease (HCC)  Fall in home, initial encounter     Rx / DC Orders   ED Discharge Orders     None        Note:  This document was prepared using Dragon voice recognition software and may include unintentional dictation errors.   Viviann Pastor, MD 08/18/24 1850  "

## 2024-08-18 NOTE — ED Notes (Signed)
 Patient given applesauce and an ice water . Patient also placed on a male purewick at this time.

## 2024-08-19 ENCOUNTER — Inpatient Hospital Stay

## 2024-08-19 DIAGNOSIS — I272 Pulmonary hypertension, unspecified: Secondary | ICD-10-CM

## 2024-08-19 DIAGNOSIS — I2510 Atherosclerotic heart disease of native coronary artery without angina pectoris: Secondary | ICD-10-CM | POA: Diagnosis not present

## 2024-08-19 DIAGNOSIS — J90 Pleural effusion, not elsewhere classified: Secondary | ICD-10-CM | POA: Diagnosis not present

## 2024-08-19 DIAGNOSIS — B9561 Methicillin susceptible Staphylococcus aureus infection as the cause of diseases classified elsewhere: Secondary | ICD-10-CM

## 2024-08-19 DIAGNOSIS — Z7189 Other specified counseling: Secondary | ICD-10-CM

## 2024-08-19 DIAGNOSIS — I480 Paroxysmal atrial fibrillation: Secondary | ICD-10-CM | POA: Diagnosis not present

## 2024-08-19 DIAGNOSIS — I509 Heart failure, unspecified: Secondary | ICD-10-CM | POA: Diagnosis not present

## 2024-08-19 DIAGNOSIS — N179 Acute kidney failure, unspecified: Secondary | ICD-10-CM | POA: Diagnosis not present

## 2024-08-19 DIAGNOSIS — I5031 Acute diastolic (congestive) heart failure: Secondary | ICD-10-CM

## 2024-08-19 DIAGNOSIS — M7989 Other specified soft tissue disorders: Secondary | ICD-10-CM

## 2024-08-19 DIAGNOSIS — I340 Nonrheumatic mitral (valve) insufficiency: Secondary | ICD-10-CM | POA: Diagnosis not present

## 2024-08-19 DIAGNOSIS — N1832 Chronic kidney disease, stage 3b: Secondary | ICD-10-CM | POA: Diagnosis not present

## 2024-08-19 DIAGNOSIS — N189 Chronic kidney disease, unspecified: Secondary | ICD-10-CM

## 2024-08-19 DIAGNOSIS — Z87891 Personal history of nicotine dependence: Secondary | ICD-10-CM

## 2024-08-19 DIAGNOSIS — M25412 Effusion, left shoulder: Secondary | ICD-10-CM

## 2024-08-19 DIAGNOSIS — R7881 Bacteremia: Secondary | ICD-10-CM

## 2024-08-19 DIAGNOSIS — Z952 Presence of prosthetic heart valve: Secondary | ICD-10-CM

## 2024-08-19 DIAGNOSIS — N184 Chronic kidney disease, stage 4 (severe): Secondary | ICD-10-CM

## 2024-08-19 DIAGNOSIS — Z951 Presence of aortocoronary bypass graft: Secondary | ICD-10-CM

## 2024-08-19 DIAGNOSIS — D631 Anemia in chronic kidney disease: Secondary | ICD-10-CM | POA: Diagnosis not present

## 2024-08-19 DIAGNOSIS — D649 Anemia, unspecified: Secondary | ICD-10-CM

## 2024-08-19 DIAGNOSIS — Z515 Encounter for palliative care: Secondary | ICD-10-CM

## 2024-08-19 DIAGNOSIS — R627 Adult failure to thrive: Secondary | ICD-10-CM | POA: Diagnosis not present

## 2024-08-19 LAB — CBC
HCT: 27.1 % — ABNORMAL LOW (ref 39.0–52.0)
Hemoglobin: 8.5 g/dL — ABNORMAL LOW (ref 13.0–17.0)
MCH: 31.5 pg (ref 26.0–34.0)
MCHC: 31.4 g/dL (ref 30.0–36.0)
MCV: 100.4 fL — ABNORMAL HIGH (ref 80.0–100.0)
Platelets: 71 K/uL — ABNORMAL LOW (ref 150–400)
RBC: 2.7 MIL/uL — ABNORMAL LOW (ref 4.22–5.81)
RDW: 17.2 % — ABNORMAL HIGH (ref 11.5–15.5)
WBC: 14.8 K/uL — ABNORMAL HIGH (ref 4.0–10.5)
nRBC: 0 % (ref 0.0–0.2)

## 2024-08-19 LAB — ECHOCARDIOGRAM COMPLETE
AV Mean grad: 20.6 mmHg
AV Peak grad: 36.5 mmHg
Ao pk vel: 3.02 m/s
Height: 73 in
MV M vel: 5.31 m/s
MV Peak grad: 112.8 mmHg
P 1/2 time: 277 ms
S' Lateral: 3.2 cm
Weight: 3040 [oz_av]

## 2024-08-19 LAB — CBG MONITORING, ED
Glucose-Capillary: 147 mg/dL — ABNORMAL HIGH (ref 70–99)
Glucose-Capillary: 233 mg/dL — ABNORMAL HIGH (ref 70–99)
Glucose-Capillary: 261 mg/dL — ABNORMAL HIGH (ref 70–99)
Glucose-Capillary: 265 mg/dL — ABNORMAL HIGH (ref 70–99)
Glucose-Capillary: 303 mg/dL — ABNORMAL HIGH (ref 70–99)

## 2024-08-19 LAB — BLOOD CULTURE ID PANEL (REFLEXED) - BCID2

## 2024-08-19 LAB — VITAMIN B12: Vitamin B-12: 1236 pg/mL — ABNORMAL HIGH (ref 180–914)

## 2024-08-19 LAB — HEPATIC FUNCTION PANEL
ALT: 15 U/L (ref 0–44)
AST: 23 U/L (ref 15–41)
Albumin: 3 g/dL — ABNORMAL LOW (ref 3.5–5.0)
Alkaline Phosphatase: 115 U/L (ref 38–126)
Bilirubin, Direct: 1.4 mg/dL — ABNORMAL HIGH (ref 0.0–0.2)
Indirect Bilirubin: 0.5 mg/dL (ref 0.3–0.9)
Total Bilirubin: 1.9 mg/dL — ABNORMAL HIGH (ref 0.0–1.2)
Total Protein: 6.6 g/dL (ref 6.5–8.1)

## 2024-08-19 LAB — BASIC METABOLIC PANEL WITH GFR
Anion gap: 10 (ref 5–15)
BUN: 76 mg/dL — ABNORMAL HIGH (ref 8–23)
CO2: 32 mmol/L (ref 22–32)
Calcium: 10.4 mg/dL — ABNORMAL HIGH (ref 8.9–10.3)
Chloride: 94 mmol/L — ABNORMAL LOW (ref 98–111)
Creatinine, Ser: 2.66 mg/dL — ABNORMAL HIGH (ref 0.61–1.24)
GFR, Estimated: 24 mL/min — ABNORMAL LOW
Glucose, Bld: 211 mg/dL — ABNORMAL HIGH (ref 70–99)
Potassium: 4.8 mmol/L (ref 3.5–5.1)
Sodium: 136 mmol/L (ref 135–145)

## 2024-08-19 LAB — GLUCOSE, CAPILLARY: Glucose-Capillary: 171 mg/dL — ABNORMAL HIGH (ref 70–99)

## 2024-08-19 MED ORDER — ACETAMINOPHEN 650 MG RE SUPP: 325.0000 mg | Freq: Three times a day (TID) | RECTAL | Status: DC

## 2024-08-19 MED ORDER — PANTOPRAZOLE SODIUM 40 MG PO TBEC
40.0000 mg | DELAYED_RELEASE_TABLET | Freq: Every day | ORAL | Status: DC
  Administered 2024-08-20 – 2024-08-21 (×2): 40 mg via ORAL
  Filled 2024-08-19 (×3): qty 1

## 2024-08-19 MED ORDER — ACETAMINOPHEN 500 MG PO TABS
500.0000 mg | ORAL_TABLET | Freq: Three times a day (TID) | ORAL | Status: DC
  Filled 2024-08-19: qty 1

## 2024-08-19 MED ORDER — FERROUS SULFATE 325 (65 FE) MG PO TABS
325.0000 mg | ORAL_TABLET | Freq: Every day | ORAL | Status: DC
  Administered 2024-08-20 – 2024-08-21 (×2): 325 mg via ORAL
  Filled 2024-08-19 (×2): qty 1

## 2024-08-19 NOTE — Evaluation (Signed)
 Physical Therapy Evaluation Patient Details Name: [name] MRN: [id number] DOB: October 19, 1945 Today's Date: 08/19/2024  History of Present Illness  79 y.o. year old male presenting to the ED with increasing weakness, hand and shoulder pain, difficulty ambulating and dyspnea; admitted for failure to thrive, acute hypoxic respiratory failure and suspected infection to L hand/wrist. Multiple recent admissions with the most recent admission from 08/10/24-08/15/24 for acute heart failure exacerbation. PMH of hypertension, hyperlipidemia complicated by CAD status post CABG, type 2 diabetes, CHF (EF 60-65%) and 02/2024, CKD 3B, and severe pulmonary hypertension  Clinical Impression  Patient resting in bed upon arrival to room; alert and oriented to basic information, follows commands and agreeable to participation (as tolerated) with session.  Endorses significant pain to neck, L shoulder and L wrist/hand (FACES 8/10); very guarded and tender to touch. Patient globally weak and deconditioned throughout all extremities, requiring act assist from therapist for movement throughout full ROM.  Currently requiring mod/max assist +2 for rolling in bed; dep assist +2 for scooting up and repositioning to comfort.  L UE placed on pillow for elevation Unable to tolerate additional mobility or OOB attempts this date; will continue to assess/progress in subsequent sessions as appropriate. Would benefit from skilled PT to address above deficits and promote optimal return to PLOF.; recommend post-acute PT follow up as indicated by interdisciplinary care team.            If plan is discharge home, recommend the following: Two people to help with walking and/or transfers;Two people to help with bathing/dressing/bathroom   Can travel by private vehicle   No    Equipment Recommendations None recommended by PT  Recommendations for Other Services       Functional Status Assessment Patient has had a recent decline  in their functional status and demonstrates the ability to make significant improvements in function in a reasonable and predictable amount of time.     Precautions / Restrictions Precautions Precautions: Fall Recall of Precautions/Restrictions: Intact Precaution/Restrictions Comments: monitor O2 Restrictions Weight Bearing Restrictions Per Provider Order: No      Mobility  Bed Mobility Overal bed mobility: Needs Assistance Bed Mobility: Rolling Rolling: Max assist, +2 for physical assistance, Used rails         General bed mobility comments: rolled to bil sides in bed for linen change from purewick malfunction/leakage; Max A +1-2 to roll in bed d/t inability to utilize LUE/HAND and very tender to touch although able to briefly hold rail with L hand; +2 to reposition in bed for upright position/comfort    Transfers                   General transfer comment: unsafe/unable to tolerate    Ambulation/Gait               General Gait Details: unsafe/unable to tolerate  Stairs            Wheelchair Mobility     Tilt Bed    Modified Rankin (Stroke Patients Only)       Balance                                             Pertinent Vitals/Pain Pain Assessment Pain Assessment: Faces Faces Pain Scale: Hurts whole lot Pain Location: L hand and shoulder, neck Pain Descriptors / Indicators: Tender, Sore Pain Intervention(s): Limited activity  within patient's tolerance, Monitored during session, Repositioned    Home Living Family/patient expects to be discharged to:: Private residence Living Arrangements: Spouse/significant other Available Help at Discharge: Family Type of Home: House Home Access: Stairs to enter Entrance Stairs-Rails: Right;Left;Can reach both Entrance Stairs-Number of Steps: 3   Home Layout: One level Home Equipment: Cane - single Librarian, Academic (2 wheels);Rollator (4 wheels);Wheelchair - manual       Prior Function Prior Level of Function : Independent/Modified Independent             Mobility Comments: ambulates with SPC PRN, denies falls, on 3L home O2 now, gait distance limited by SOB & need to rest ADLs Comments: Ind with self care and family assists as needed with IADLs     Extremity/Trunk Assessment   Upper Extremity Assessment Upper Extremity Assessment: Generalized weakness (grossly 3-/5 throughout; very guarded and effortful.  L hand, wrist edematous and tender to touch) LUE Deficits / Details: very tender and swollen, able to grip bed rail but does not like to be touched    Lower Extremity Assessment Lower Extremity Assessment: Generalized weakness (grossly 3-/5 throughout; very effortful activation)       Communication   Communication Communication: No apparent difficulties    Cognition Arousal: Alert Behavior During Therapy: WFL for tasks assessed/performed   PT - Cognitive impairments: No apparent impairments                         Following commands: Intact       Cueing Cueing Techniques: Verbal cues     General Comments General comments (skin integrity, edema, etc.): remains deconditioned/weak, very limited by LUE pain and tenderness    Exercises  Noted to be soiled with urine during mobility efforts; mod/max assist +1-2 for rolling; dep assist for hygiene, gown, linen change.  Increased time, effort and caution to L UE and head/neck with all mobility efforts due to generalized pain/soreness.   Assessment/Plan    PT Assessment Patient needs continued PT services  PT Problem List Cardiopulmonary status limiting activity;Decreased activity tolerance;Decreased balance;Decreased mobility;Decreased strength;Decreased safety awareness;Decreased range of motion;Decreased knowledge of use of DME;Decreased knowledge of precautions;Pain       PT Treatment Interventions DME instruction;Balance training;Gait training;Stair training;Functional  mobility training;Patient/family education;Therapeutic activities;Therapeutic exercise;Neuromuscular re-education    PT Goals (Current goals can be found in the Care Plan section)  Acute Rehab PT Goals Patient Stated Goal: feel better PT Goal Formulation: With patient Time For Goal Achievement: 09/02/24 Potential to Achieve Goals: Fair    Frequency Min 2X/week     Co-evaluation   Reason for Co-Treatment: For patient/therapist safety;To address functional/ADL transfers PT goals addressed during session: Mobility/safety with mobility OT goals addressed during session: ADL's and self-care       AM-PAC PT 6 Clicks Mobility  Outcome Measure Help needed turning from your back to your side while in a flat bed without using bedrails?: A Lot Help needed moving from lying on your back to sitting on the side of a flat bed without using bedrails?: A Lot Help needed moving to and from a bed to a chair (including a wheelchair)?: A Lot Help needed standing up from a chair using your arms (e.g., wheelchair or bedside chair)?: A Lot Help needed to walk in hospital room?: A Lot Help needed climbing 3-5 steps with a railing? : Total 6 Click Score: 11    End of Session Equipment Utilized During Treatment: Oxygen Activity Tolerance:  Patient limited by pain Patient left: with call bell/phone within reach;with family/visitor present   PT Visit Diagnosis: Difficulty in walking, not elsewhere classified (R26.2);Muscle weakness (generalized) (M62.81);Unsteadiness on feet (R26.81)    Time: 8892-8865 PT Time Calculation (min) (ACUTE ONLY): 27 min   Charges:   PT Evaluation $PT Eval Moderate Complexity: 1 Mod   PT General Charges $$ ACUTE PT VISIT: 1 Visit         Malissa Slay H. Delores, PT, DPT, NCS 08/19/2024, 2:24 PM [phone]

## 2024-08-19 NOTE — ED Notes (Signed)
 Doctor at bedside. Son in room

## 2024-08-19 NOTE — Inpatient Diabetes Management (Signed)
 Inpatient Diabetes Program Recommendations  AACE/ADA: New Consensus Statement on Inpatient Glycemic Control (2015)  Target Ranges:  Prepandial:   less than 140 mg/dL      Peak postprandial:   less than 180 mg/dL (1-2 hours)      Critically ill patients:  140 - 180 mg/dL    Latest Reference Range & Units 08/19/24 00:26 08/19/24 07:23  Glucose-Capillary 70 - 99 mg/dL 696 (H)  4 units Novolog   265 (H)  (H): Data is abnormally high   Admit with: Failure to thrive in adult  Patient with multiple admissions for acute hypoxic respiratory failure coming in with worsening dyspnea and volume overload.   History: DM2, CHF, CKD  Home DM Meds: Jardiance  10 mg daily  Current Orders: Novolog  Moderate Correction Scale/ SSI (0-15 units) TID AC + HS    MD- Note CBG 265 this AM  Please consider starting Lantus  8 units daily (0.1 units/kg)   --Will follow patient during hospitalization--  Adina Rudolpho Arrow RN, MSN, CDCES Diabetes Coordinator Inpatient Glycemic Control Team Team Pager: [phone] (8a-5p)

## 2024-08-19 NOTE — Consult Note (Addendum)
 " [facility] CLINIC CARDIOLOGY CONSULT NOTE       Patient ID: [name] MRN: [id number] DOB/AGE: [birth year] 79 y.o.  Admit date: 08/18/2024 Referring Physician Dr. Morene Bathe Primary Physician [name], MD  Primary Cardiologist Dr. Ammon Reason for Consultation AoCHF  HPI: [name] is a 79 y.o. 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 Cardiology was consulted for further evaluation.   Patient presented for worsening SOB and weakness workup in the ED notable for creatinine 2.53, potassium 4.9, hemoglobin 8.6, WBC 16.9. Troponins 167 > 159, BNP 28,444. EKG in the ED atrial fibrillation rate 87 bpm. CXR with moderate R pleural effusion.   At the time of my evaluation today, he is resting upright in hospital bed. States he has been having shortness of breath essentially since he was discharged from the hospital last week that has been progressively worsening.  Also endorses gradually worsening weakness.  He appears extremely fatigued and lethargic on exam today which is a stark difference from when I last evaluated him last week during admission.  He also has a edema to his left hand and wrist.  No significant lower extremity edema noted  Review of systems complete and found to be negative unless listed above    Past Medical History:  Diagnosis Date   (HFpEF) heart failure with preserved ejection fraction (HCC) 07/25/2016   a.)TTE 07/25/16: EF >55, triv-mild pan regur, mild AS, G2DD; b.)TTE 09/02/16: EF >55, triv TR, mild AS; c.)TTE 11/05/16: EF >55, LAE, triv-mild pan regur, mild AS, G2DD; d.)TTE 01/21/20: EF >55, mod LVH, LAE/RVE, triv-mild  AR/PR/TR, mod MR, mild AS, G2DD; e.)TTE 09/24/22: EF>55, sev LVH, sev LAE, RAE, mild PR, mod AR/MR/TR, mild-mod AS, RVSP 74.7; f.)TTE 12/23/22: EF 60-65, LAE, mild-mod MR/TR, mild AS   Abnormal urine 05/01/2023   Anemia    Aortic atherosclerosis    Aortic stenosis 07/25/2016   a.) TTE 07/25/2016: mild (MPG 13.5); b.) TTE 09/02/2016: mild (MPG 16); c.) TTE 11/05/2016: mild (MPG 9); d.) TTE 01/21/2020: mild (MPG 11.7); e.) TTE 09/24/2022: mild-mod (MPG 20.3); f.) TTE 12/23/2022: mild (MPG 15)   Atrial fibrillation (HCC)    a.) CHA2DS2VASc = 6 (age x2, CHF, HTN, vascular disease history, T2DM);  b.) rate/rhythm maintained on oral labetalol ; chronically anticoagulated with apixaban  + clopidogrel    AV block, 1st degree    B12 deficiency    CKD (chronic kidney disease), stage III (HCC)    Colon polyps    Congestive heart failure (HCC) 05/01/2023   Coronary artery disease 08/16/2016   a.) s/p 3v CABG 08/30/2016; b.) s/p PCI 11/26/2016 (DES x 1 oSVG-RCA); c.) s/p PCI 12/17/2016 (DES x 5 --> mLCx x2, dLCx, pLAD, dLAD); c.) s/p PCI 06/09/2020 (DES x1 --> ISR oSVG-PDA)   Cyst of kidney, acquired 07/03/2023   Dyspnea    Gastroesophageal reflux disease 05/01/2023   GERD (gastroesophageal reflux disease)    Heart murmur    Hepatic steatosis    History of 2019 novel coronavirus disease (COVID-19) 09/02/2020   a.) Tx'd with remdesivir    History of blood transfusion    History of GI bleed 09/02/2020   a.) admitted to [facility] 09/02/2020 - 09/08/2020   History of heart artery stent 11/26/2016  TOTAL of 7 stents: a.) 11/26/2016 --> 3.5 x 22 mm Resolute Integrity oSVG-RCA; b.) 12/17/2016: 3.0 x 12 mm Xience Alpine dLCx, 3.5 x 22 mm Resolute Onyx mLCx, 3.5 x 18 mm Resolute Onyx mLCx, 3.0 x 26 mm Resolute Onyx pLAD, 2.0 x 26 mm Resolute Onyx dLAD; c.) 06/09/2020 --> 2.5 x 18 mm Resolute Onyx ISR oSVG-PDA   History of partial colectomy 08/12/2012   a.) large gastric hyperplastic polyp removal   HLD  (hyperlipidemia)    Hypertension    LBBB (left bundle branch block)    Long term current use of anticoagulant    a.) apixaban    Long term current use of antithrombotics/antiplatelets    a.) clopidogrel    Male hypogonadism    a.) on exogenous TRT (1.62% gel)   Mild cardiomegaly    Mild gynecomastia (bilateral)    Nephrolithiasis    OSA (obstructive sleep apnea)    a.) does not utilize nocturnal PAP therapy   Personal history of surgery to heart and great vessels, presenting hazards to health 05/01/2023   Proteinuria, unspecified 08/01/2023   Pulmonary hypertension (HCC) 09/24/2022   a.) TTE 09/24/2022: RVSP 74.7; b.) TTE 12/23/2022: RVSP 31.3   RBBB (right bundle branch block with left anterior fascicular block)    Right inguinal hernia    Right thyroid  nodule    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   a.) LIMA-LAD, SVG-PDA, SVG-OM3   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/01/2023   Type 2 diabetes mellitus treated with insulin  (HCC)    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   Umbilical hernia    Unstable angina (HCC)    Vitamin D  deficiency     Past Surgical History:  Procedure Laterality Date   ANOMALOUS PULMONARY VENOUS RETURN REPAIR, TOTAL  2025   APPENDECTOMY     CARDIAC CATHETERIZATION Left 08/16/2016   Procedure: Left Heart Cath and Coronary Angiography;  Surgeon: Cara JONETTA Lovelace, MD;  Location: [facility] INVASIVE CV LAB;  Service: Cardiovascular;  Laterality: Left;   COLONOSCOPY N/A 09/07/2020   Procedure: COLONOSCOPY;  Surgeon: Janalyn Keene NOVAK, MD;  Location: [facility] ENDOSCOPY;  Service: Endoscopy;  Laterality: N/A;   COLONOSCOPY N/A 10/19/2023   Procedure: COLONOSCOPY;  Surgeon: Jinny Carmine, MD;  Location: [facility] ENDOSCOPY;  Service: Endoscopy;  Laterality: N/A;   COLONOSCOPY WITH PROPOFOL  N/A 05/17/2021   Procedure: COLONOSCOPY WITH PROPOFOL ;  Surgeon: Janalyn Keene NOVAK, MD;  Location: [facility] ENDOSCOPY;  Service: Endoscopy;  Laterality:  N/A;   CORONARY ANGIOPLASTY WITH STENT PLACEMENT Left 11/26/2016   Procedure: CORONARY ANGIOPLASTY WITH STENT PLACEMENT; Location: [facility]; Surgeon: Alm Dais, MD   CORONARY ARTERY BYPASS GRAFT N/A 08/30/2016   Procedure: CORONARY ARTERY BYPASS GRAFT; Location: [facility]; Surgeon: Juliene Pouch, MD   CORONARY STENT INTERVENTION N/A 06/09/2020   Procedure: CORONARY STENT INTERVENTION;  Surgeon: Lovelace Cara JONETTA, MD;  Location: [facility] INVASIVE CV LAB;  Service: Cardiovascular;  Laterality: N/A;   CORONARY STENT INTERVENTION Left 12/17/2016   Procedure: CORONARY STENT INTERVENTION; Location: [facility]; Surgeon: Alm Dais, MD   CORONARY STENT INTERVENTION  03/2024   ENDOSCOPIC MUCOSAL RESECTION N/A 09/22/2020   Procedure: ENDOSCOPIC MUCOSAL RESECTION;  Surgeon: [name] Aloha Raddle., MD;  Location: [facility] ENDOSCOPY;  Service: Gastroenterology;  Laterality: N/A;   ESOPHAGOGASTRODUODENOSCOPY N/A 09/07/2020   Procedure: ESOPHAGOGASTRODUODENOSCOPY (EGD);  Surgeon: Janalyn Keene NOVAK, MD;  Location: [facility] ENDOSCOPY;  Service: Endoscopy;  Laterality: N/A;   ESOPHAGOGASTRODUODENOSCOPY N/A 10/19/2023   Procedure: EGD (ESOPHAGOGASTRODUODENOSCOPY);  Surgeon: Jinny Carmine, MD;  Location: [facility] ENDOSCOPY;  Service: Endoscopy;  Laterality: N/A;   ESOPHAGOGASTRODUODENOSCOPY (EGD) WITH PROPOFOL  N/A 09/22/2020   Procedure: ESOPHAGOGASTRODUODENOSCOPY (EGD) WITH PROPOFOL ;  Surgeon: [name] Aloha Raddle., MD;  Location: [facility] ENDOSCOPY;  Service: Gastroenterology;  Laterality: N/A;   ESOPHAGOGASTRODUODENOSCOPY (EGD) WITH PROPOFOL  N/A 08/26/2023   Procedure: ESOPHAGOGASTRODUODENOSCOPY (EGD) WITH PROPOFOL ;  Surgeon: Jinny Carmine, MD;  Location: [facility] ENDOSCOPY;  Service: Endoscopy;  Laterality: N/A;   FRACTURE SURGERY     HEMOSTASIS CLIP PLACEMENT  09/22/2020   Procedure: HEMOSTASIS CLIP PLACEMENT;  Surgeon: [name] Aloha Raddle., MD;  Location: [facility] ENDOSCOPY;  Service: Gastroenterology;;   HEMOSTASIS CONTROL  09/22/2020    Procedure: HEMOSTASIS CONTROL;  Surgeon: [name] Aloha Raddle., MD;  Location: [facility] ENDOSCOPY;  Service: Gastroenterology;;   INSERTION OF MESH  02/19/2023   Procedure: INSERTION OF MESH;  Surgeon: Desiderio Schanz, MD;  Location: [facility] ORS;  Service: General;;  umbilical   LAPAROSCOPIC PARTIAL RIGHT COLECTOMY Right 08/12/2012   Procedure: LAPAROSCOPIC PARTIAL RIGHT COLECTOMY; Location: [facility]; Surgeon: Unknown Sharps, MD   LEFT HEART CATH AND CORONARY ANGIOGRAPHY Left 06/09/2020   Procedure: LEFT HEART CATH AND CORONARY ANGIOGRAPHY;  Surgeon: Florencio Cara BIRCH, MD;  Location: [facility] INVASIVE CV LAB;  Service: Cardiovascular;  Laterality: Left;   LEFT HEART CATH AND CORS/GRAFTS ANGIOGRAPHY Left 11/20/2016   Procedure: Left Heart Cath and Cors/Grafts Angiography;  Surgeon: Cara BIRCH Florencio, MD;  Location: [facility] INVASIVE CV LAB;  Service: Cardiovascular;  Laterality: Left;   POLYPECTOMY  09/22/2020   Procedure: POLYPECTOMY;  Surgeon: [name] Aloha Raddle., MD;  Location: [facility] ENDOSCOPY;  Service: Gastroenterology;;   POLYPECTOMY  10/19/2023   Procedure: POLYPECTOMY;  Surgeon: Jinny Carmine, MD;  Location: [facility] ENDOSCOPY;  Service: Endoscopy;;   RIGHT HEART CATH Right 07/01/2024   Procedure: RIGHT HEART CATH;  Surgeon: Ammon Blunt, MD;  Location: [facility] INVASIVE CV LAB;  Service: Cardiovascular;  Laterality: Right;   SUBMUCOSAL LIFTING INJECTION  09/22/2020   Procedure: SUBMUCOSAL LIFTING INJECTION;  Surgeon: [name] Aloha Raddle., MD;  Location: [facility] ENDOSCOPY;  Service: Gastroenterology;;   UMBILICAL HERNIA REPAIR N/A 02/19/2023   Procedure: HERNIA REPAIR UMBILICAL ADULT, open;  Surgeon: Desiderio Schanz, MD;  Location: [facility] ORS;  Service: General;  Laterality: N/A;   XI ROBOTIC ASSISTED INGUINAL HERNIA REPAIR WITH MESH Right 02/19/2023   Procedure: XI ROBOTIC ASSISTED INGUINAL HERNIA REPAIR WITH MESH;  Surgeon: Desiderio Schanz, MD;  Location: [facility] ORS;  Service: General;  Laterality: Right;    (Not in  a hospital admission)  Social History   Socioeconomic History   Marital status: Married    Spouse name: Not on file   Number of children: Not on file   Years of education: Not on file   Highest education level: Not on file  Occupational History   Not on file  Tobacco Use   Smoking status: Former    Current packs/day: 0.00    Types: Cigarettes    Quit date: 2000    Years since quitting: 26.0    Passive exposure: Never   Smokeless tobacco: Never  Vaping Use   Vaping status: Never Used  Substance and Sexual Activity   Alcohol use: No   Drug use: No   Sexual activity: Not Currently  Other Topics Concern   Not on file  Social History Narrative   Not on file   Social Drivers of Health   Tobacco Use: Medium Risk (08/10/2024)   Patient History    Smoking Tobacco Use: Former    Smokeless Tobacco Use: Never  Passive Exposure: Never  Physicist, Medical Strain: Low Risk  (05/08/2024)   Received from [facility]   Overall Financial Resource Strain (CARDIA)    Difficulty of Paying Living Expenses: Not hard at all  Food Insecurity: No Food Insecurity (08/11/2024)   Epic    Worried About Programme Researcher, Broadcasting/film/video in the Last Year: Never true    Ran Out of Food in the Last Year: Never true  Transportation Needs: No Transportation Needs (08/11/2024)   Epic    Lack of Transportation (Medical): No    Lack of Transportation (Non-Medical): No  Physical Activity: Not on file  Stress: Not on file  Social Connections: Unknown (08/11/2024)   Social Connection and Isolation Panel    Frequency of Communication with Friends and Family: More than three times a week    Frequency of Social Gatherings with Friends and Family: More than three times a week    Attends Religious Services: Not on file    Active Member of Clubs or Organizations: Not on file    Attends Banker Meetings: Not on file    Marital Status: Not on file  Recent Concern: Social Connections -  Moderately Isolated (07/18/2024)   Social Connection and Isolation Panel    Frequency of Communication with Friends and Family: More than three times a week    Frequency of Social Gatherings with Friends and Family: More than three times a week    Attends Religious Services: Never    Database Administrator or Organizations: No    Attends Banker Meetings: Never    Marital Status: Married  Catering Manager Violence: Unknown (08/11/2024)   Epic    Fear of Current or Ex-Partner: No    Emotionally Abused: No    Physically Abused: Not on file    Sexually Abused: Not on file  Depression (PHQ2-9): Low Risk (07/16/2024)   Depression (PHQ2-9)    PHQ-2 Score: 0  Alcohol Screen: Not on file  Housing: Unknown (08/11/2024)   Epic    Unable to Pay for Housing in the Last Year: No    Number of Times Moved in the Last Year: 0    Homeless in the Last Year: Not on file  Utilities: Not At Risk (08/11/2024)   Epic    Threatened with loss of utilities: No  Health Literacy: Not on file    Family History  Problem Relation Age of Onset   Hyperlipidemia Mother    Heart disease Mother    Hypertension Father      Vitals:   08/19/24 0232 08/19/24 0500 08/19/24 0555 08/19/24 0855  BP: (!) 112/55 121/85  (!) 109/53  Pulse: 82 85 81 77  Resp: (!) 26 (!) 24 20 20   Temp:  98.3 F (36.8 C)  97.8 F (36.6 C)  TempSrc:  Axillary  Oral  SpO2: 94% 93% 100% 100%  Weight:      Height:        PHYSICAL EXAM General: Ill-appearing male, in no acute distress. HEENT: Normocephalic and atraumatic. Neck: No JVD.  Lungs: Normal respiratory effort on 5L [place].  Diminished bilaterally. Heart: Irregularly irregular, controlled rate. Normal S1 and S2 without gallops or murmurs.  Abdomen: Non-distended appearing.  Msk: Normal strength and tone for age. Extremities: Warm and well perfused. No clubbing, cyanosis.  Chronic appearing but overall improved edema.  Neuro: Alert and oriented X 3. Psych:  Answers questions appropriately.   Labs: Basic Metabolic Panel: Recent Labs  08/18/24 1641 08/19/24 0425  NA 136 136  K 4.9 4.8  CL 94* 94*  CO2 30 32  GLUCOSE 203* 211*  BUN 69* 76*  CREATININE 2.53* 2.66*  CALCIUM  11.2* 10.4*  MG 2.0  --    Liver Function Tests: Recent Labs    08/18/24 1641 08/19/24 0425  AST 31 23  ALT 18 15  ALKPHOS 134* 115  BILITOT 2.5* 1.9*  PROT 7.4 6.6  ALBUMIN 3.4* 3.0*   No results for input(s): LIPASE, AMYLASE in the last 72 hours. CBC: Recent Labs    08/18/24 1641 08/19/24 0425  WBC 16.9* 14.8*  NEUTROABS 15.7*  --   HGB 8.6* 8.5*  HCT 27.4* 27.1*  MCV 100.0 100.4*  PLT 86* 71*   Cardiac Enzymes: No results for input(s): CKTOTAL, CKMB, CKMBINDEX, TROPONINIHS in the last 72 hours. BNP: No results for input(s): BNP in the last 72 hours. D-Dimer: No results for input(s): DDIMER in the last 72 hours. Hemoglobin A1C: No results for input(s): HGBA1C in the last 72 hours. Fasting Lipid Panel: No results for input(s): CHOL, HDL, LDLCALC, TRIG, CHOLHDL, LDLDIRECT in the last 72 hours. Thyroid  Function Tests: No results for input(s): TSH, T4TOTAL, T3FREE, THYROIDAB in the last 72 hours.  Invalid input(s): FREET3 Anemia Panel: Recent Labs    08/18/24 2210  FOLATE 12.3     Radiology: DG Hand Complete Left Result Date: 08/19/2024 EXAM: 3 OR MORE VIEW(S) XRAY OF THE LEFT HAND 08/19/2024 12:49:00 AM COMPARISON: 08/18/2024. CLINICAL HISTORY: Swelling. FINDINGS: BONES AND JOINTS: No acute fracture, subluxation or dislocation. No malalignment. Calcifications about the left wrist, likely synovial calcifications. Arthritic changes throughout the left wrist. SOFT TISSUES: Small radiopaque foreign body noted in the soft tissues of the left middle finger anterior to the middle phalanx. IMPRESSION: 1. Small radiopaque foreign body in the soft tissues of the left middle finger anterior to the middle  phalanx. 2. No acute fracture, subluxation, or dislocation. Electronically signed by: Franky Crease MD 08/19/2024 01:00 AM EST RP Workstation: [id number]   US  Venous Img Upper Uni Left (DVT) Result Date: 08/19/2024 CLINICAL DATA:  Swelling EXAM: Left UPPER EXTREMITY VENOUS DOPPLER ULTRASOUND TECHNIQUE: Gray-scale sonography with graded compression, as well as color Doppler and duplex ultrasound were performed to evaluate the upper extremity deep venous system from the level of the subclavian vein and including the jugular, axillary, basilic, radial, ulnar and upper cephalic vein. Spectral Doppler was utilized to evaluate flow at rest and with distal augmentation maneuvers. COMPARISON:  None Available. FINDINGS: Contralateral Subclavian Vein: Respiratory phasicity is normal and symmetric with the symptomatic side. No evidence of thrombus. Internal Jugular Vein: No evidence of thrombus. Normal compressibility, respiratory phasicity and response to augmentation. Subclavian Vein: No evidence of thrombus. Normal respiratory phasicity and response to augmentation. Axillary Vein: No evidence of thrombus. Normal compressibility, respiratory phasicity and response to augmentation. Cephalic Vein: No evidence of thrombus. Normal compressibility, respiratory phasicity and response to augmentation. Basilic Vein: No evidence of thrombus. Normal compressibility, respiratory phasicity and response to augmentation. Brachial Veins: No evidence of thrombus. Normal compressibility, respiratory phasicity and response to augmentation. Radial Veins: No evidence of thrombus. Normal compressibility, respiratory phasicity and response to augmentation. Ulnar Veins: No evidence of thrombus. Normal compressibility, respiratory phasicity and response to augmentation. IMPRESSION: No evidence of DVT within the left upper extremity. Electronically Signed   By: Luke Bun M.D.   On: 08/19/2024 00:31   DG Shoulder Left Result Date:  08/18/2024 EXAM: 1 VIEW(S) XRAY OF  THE LEFT SHOULDER 08/18/2024 07:58:00 PM COMPARISON: None available. CLINICAL HISTORY: Shoulder pain, left. FINDINGS: BONES AND JOINTS: Glenohumeral joint is normally aligned. No acute fracture. No malalignment. 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 SOFT TISSUES: No abnormal calcifications. Visualized lung is unremarkable. IMPRESSION: 1. 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 Electronically signed by: Elsie Gravely MD 08/18/2024 08:12 PM EST RP Workstation: [id number]   DG Hand 2 View Left Result Date: 08/18/2024 EXAM: 1 or 2 VIEW(S) XRAY OF THE LEFT HAND 08/18/2024 07:58:00 PM COMPARISON: None available. CLINICAL HISTORY: Pain FINDINGS: BONES AND JOINTS: Prominent degenerative changes in the interphalangeal joints, first carpometacarpal and metacarpophalangeal joints, radiocarpal, radioulnar, and intercarpal joints. Old healed fracture deformity of the distal radius. Subcortical cyst in the base of the 1st metacarpal, likely degenerative cyst. SOFT TISSUES: Soft tissue calcifications in and around the wrist likely representing synovial calcifications. Dorsal soft tissue swelling. Tiny metallic foreign body suggested in the soft tissues of the third finger adjacent to the middle phalanx. IMPRESSION: 1. Dorsal soft tissue swelling. 2. Tiny metallic foreign body suggested in the soft tissues of the third finger adjacent to the middle phalanx. 3. Prominent degenerative changes in the interphalangeal joints, first carpometacarpal and metacarpophalangeal joints, radiocarpal, radioulnar, and intercarpal joints. 4. Soft tissue calcifications in and around the wrist, likely representing synovial calcifications. 5. Old healed fracture deformity of the distal radius. Electronically signed by: Elsie Gravely MD 08/18/2024 08:11 PM EST RP Workstation: [id number]   DG Chest Portable 1 View Result Date: 08/18/2024 EXAM: 1  VIEW(S) XRAY OF THE CHEST 08/18/2024 04:53:00 PM COMPARISON: 08/10/2024 CLINICAL HISTORY: SOB FINDINGS: LUNGS AND PLEURA: Moderate right pleural effusion, stable compared to prior. Small left pleural effusion. Mild diffuse interstitial prominence. Bibasilar airspace opacities, likely compressive atelectasis. No pneumothorax. HEART AND MEDIASTINUM: Enlarged cardiomediastinal silhouette, unchanged. Median sternotomy and TAVR noted. Atherosclerotic calcifications. BONES AND SOFT TISSUES: No acute osseous abnormality. IMPRESSION: 1. Moderate right pleural effusion and small left pleural effusion. 2. Mild diffuse interstitial prominence and bibasilar airspace opacities, likely compressive atelectasis. 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 LUNGS AND PLEURA: Mild pulmonary edema, unchanged. Stable small to moderate bilateral pleural effusions. Unchanged bibasilar opacities. No pneumothorax. HEART AND MEDIASTINUM: Stable cardiomegaly. Aortic atherosclerosis. BONES AND SOFT TISSUES: Median sternotomy wires noted. No acute osseous abnormality. IMPRESSION: 1. Mild pulmonary edema, unchanged. 2. Stable small to moderate bilateral pleural effusions. Electronically signed by: Greig Pique MD 08/10/2024 06:13 PM EST RP Workstation: [id number]   US  THORACENTESIS ASP PLEURAL SPACE W/IMG GUIDE Result Date: 08/10/2024 INDICATION: 79 year old male with an extensive cardiac history who presented to the ED today with worsening exertional chest pain and dyspnea on exertion. Workup consistent with heart failure exacerbation and imaging concerning for moderate bilateral pleural effusions with mild pulmonary edema and bibasilar atelectasis. Request  for diagnostic and therapeutic thoracentesis. EXAM: ULTRASOUND GUIDED DIAGNOSTIC AND THERAPEUTIC, LEFT-SIDED THORACENTESIS MEDICATIONS: 1% lidocaine , 6 mL. COMPLICATIONS: None immediate. PROCEDURE: An ultrasound guided thoracentesis was thoroughly discussed with the patient and questions answered. The benefits, risks, alternatives and complications were also discussed. The patient understands and wishes to proceed with the procedure. Written consent was obtained. Ultrasound was performed to localize and mark an adequate pocket of fluid in the left chest. The area  was then prepped and draped in the normal sterile fashion. 1% Lidocaine  was used for local anesthesia. Under ultrasound guidance a 6 Fr Safe-T-Centesis catheter was introduced. Thoracentesis was performed. The catheter was removed and a dressing applied. FINDINGS: A total of approximately 700 mL of serous sanguinous fluid was removed. Samples were sent to the laboratory as requested by the clinical team. IMPRESSION: Successful ultrasound guided left-sided thoracentesis yielding 700 mL of pleural fluid. Procedure performed by: Sherrilee Bal, PA-C under the supervision of Dr. VEAR Lent Electronically Signed   By: Wilkie Lent M.D.   On: 08/10/2024 16:38   DG Chest 2 View Result Date: 08/10/2024 EXAM: 2 VIEW(S) XRAY OF THE CHEST 08/10/2024 08:36:00 AM COMPARISON: 08/01/2024 CLINICAL HISTORY: dyspnea, cp FINDINGS: LUNGS AND PLEURA: Moderate bilateral pleural effusions. Mild pulmonary edema. Bibasilar atelectasis/airspace disease. No pneumothorax. HEART AND MEDIASTINUM: Stable CABG markers. TAVR noted. Aortic atherosclerosis. BONES AND SOFT TISSUES: Sternotomy wires noted. No acute osseous abnormality. IMPRESSION: 1. Moderate bilateral pleural effusions with mild pulmonary edema and bibasilar atelectasis/airspace disease. Electronically signed by: Waddell Calk MD 08/10/2024 08:41 AM EST RP Workstation: [id number]   DG Chest 1 View Result Date:  08/01/2024 EXAM: 1 VIEW(S) XRAY OF THE CHEST 08/01/2024 09:32:09 AM COMPARISON: 07/20/2024 CLINICAL HISTORY: SOB (shortness of breath) FINDINGS: LUNGS AND PLEURA: Bibasilar airspace opacities are stable. Mild pulmonary edema is present. Moderate right pleural effusion, slightly decreased. Similar small left pleural effusion. No pneumothorax. HEART AND MEDIASTINUM: Stable cardiomegaly. Aortic atherosclerotic calcification. Aortic valve replacement is noted. BONES AND SOFT TISSUES: Median sternotomy is noted. No acute osseous abnormality. IMPRESSION: 1. Moderate right pleural effusion, slightly decreased, and similar small left pleural effusion. 2. Stable bibasilar airspace opacities and mild pulmonary edema. 3. Stable cardiomegaly and aortic atherosclerotic calcification. 4. Postsurgical changes of median sternotomy and aortic valve replacement. Electronically signed by: Evalene Coho MD 08/01/2024 09:37 AM EST RP Workstation: [id number]   CT CHEST ABDOMEN PELVIS WO CONTRAST Result Date: 07/20/2024 EXAM: CT CHEST, ABDOMEN AND PELVIS WITHOUT CONTRAST 07/20/2024 06:52:00 PM TECHNIQUE: CT of the chest, abdomen and pelvis was performed without the administration of intravenous contrast. Multiplanar reformatted images are provided for review. Automated exposure control, iterative reconstruction, and/or weight based adjustment of the mA/kV was utilized to reduce the radiation dose to as low as reasonably achievable. COMPARISON: 08/20/2023 CLINICAL HISTORY: LDH elevation. Oncology wanted to see if cancerous process. Okay for oral contrast but not intravenous. FINDINGS: CHEST: MEDIASTINUM AND LYMPH NODES: Status post transcatheter aortic valve replacement and coronary artery bypass grafting. Mild cardiomegaly. No pericardial effusion. The central pulmonary arteries are enlarged in keeping with changes of pulmonary arterial hypertension. Mild atherosclerotic calcification within the thoracic aorta. No aortic aneurysm.  There is progressive pathologic mediastinal adenopathy. By example, previously measured left prevascular and precarinal lymph nodes now measure 12 mm (previously 10 mm) and 20 mm (previously 10 mm) in short axis diameter. The findings suggest either a chronic inflammatory or low-grade lymphoproliferative process. Stable 2.3 cm right thyroid  nodule, better assessed on thyroid  sonogram of 10/12/2020. No follow up imaging is recommended. LUNGS AND PLEURA: Small to moderate bilateral pleural effusions, right greater than left, appears similar to prior examination though there is new pleural thickening bilaterally as well as probable septation noted at the right basilar pleural space which is nonspecific in the setting of repeated thoracentesis. Small focus of pleural gas on the right may relate to recent thoracentesis. No focal consolidation or pulmonary edema. No pneumothorax. ABDOMEN AND PELVIS: LIVER: The liver is unremarkable. GALLBLADDER  AND BILE DUCTS: Cholelithiasis without superimposed pericholecystic inflammatory change. No intra or extrahepatic biliary ductal dilation. SPLEEN: No acute abnormality. PANCREAS: No acute abnormality. ADRENAL GLANDS: No acute abnormality. KIDNEYS, URETERS AND BLADDER: Simple exophytic cortical cysts are seen within the kidneys bilaterally for which no follow up imaging is recommended. Of a hyperdense exophytic cortical cyst arises anteriorly but little lower pole of the right kidney but is unchanged from prior examination and was better characterized as a simple cyst on that exam. Per consensus, no follow-up is needed for simple Bosniak type 1 and 2 renal cysts, unless the patient has a malignancy history or risk factors. 7 mm nonobstructing calculus within the lower pole of the left kidney. The kidneys are otherwise unremarkable. No stones in the ureters. No hydronephrosis. No perinephric or periureteral stranding. Urinary bladder is unremarkable. GI AND BOWEL: Moderate stool  burden within the distal colon and rectum without evidence of obstruction. The stomach, small bowel, and large bowel are otherwise unremarkable. Appendix absent. REPRODUCTIVE ORGANS: No acute abnormality. PERITONEUM AND RETROPERITONEUM: No ascites. No free air. VASCULATURE: Aorta is normal in caliber. Extensive aortic iliac atherosclerotic calcification. ABDOMINAL AND PELVIS LYMPH NODES: Pathologic aortic cable adenopathy with a single lymph node measuring 15 mm in short axis diameter is stable since prior examination, nonspecific. No additional pathologic adenopathy within the abdomen and pelvis. REPRODUCTIVE ORGANS: No acute abnormality. BONES AND SOFT TISSUES: Osseous structures are age appropriate. No acute bone abnormality. No lytic or blastic bone lesion. No focal soft tissue abnormality. IMPRESSION: 1. Progressive pathologic mediastinal adenopathy, suggestive of a chronic inflammatory or low-grade lymphoproliferative process. 2. Small to moderate bilateral pleural effusions, right greater than left, with new bilateral pleural thickening and probable right basilar pleural space septation; small right pleural gas likely post-thoracentesis. 3. Stable pathologic aortocaval adenopathy measuring 15 mm in short axis, nonspecific. 4. Stable 2.3 cm right thyroid  nodule; no follow-up imaging is recommended. 5. Cholelithiasis without acute inflammatory change and without biliary ductal dilation. 6. Moderate stool burden in the distal colon and rectum without obstruction. 7. RAF score: Aortic Atherosclerosis (ICD10-I70.0). Electronically signed by: Dorethia Molt MD 07/20/2024 08:42 PM EST RP Workstation: [id number]    ECHO pending  TELEMETRY (personally reviewed): atrial fibrillation rate 70s  EKG (personally reviewed): atrial fibrillation rate 87 bpm  Data reviewed by me 08/19/2024: last 24h vitals tele labs imaging I/O ED provider note, admission H&P  Principal Problem:   Failure to thrive in adult Active  Problems:   T2DM (type 2 diabetes mellitus) (HCC)   Essential hypertension   OSA (obstructive sleep apnea)   CAD (coronary artery disease), s/p CABG x 3   Hyperlipidemia associated with type 2 diabetes mellitus (HCC)   Paroxysmal atrial fibrillation (HCC)   Acute kidney injury superimposed on stage 3b chronic kidney disease (HCC)   Moderate mitral regurgitation   Anemia in chronic kidney disease   Acute heart failure with preserved ejection fraction (HCC)    ASSESSMENT AND PLAN:  [name] is a 79 y.o. 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 Cardiology was consulted for further evaluation.   # Failure to thrive # Acute hypoxic respiratory failure # Acute on chronic HFpEF # AKI on CKD # Recurrent pleural effusions #  MSSA bacteremia # Coronary artery disease s/p CABG # Demand ischemia # Aortic stenosis s/p TAVR 01/2024 # Chronic anemia Patient with past medical history as above who has been admitted multiple times recently due to anemia requiring transfusions as well as acute heart failure and recurrent pleural effusions.  He presented this admission with complaints of generalized weakness and shortness of breath worsening over the last week.  BNP elevated at 28,000.  Troponins mildly elevated and flat trending at 167 > 159.  EKG without acute ischemic changes.  Creatinine up from baseline at 2.53 on admission.  He appears quite ill on exam with no significant increase in work of breathing but currently on 5 L nasal cannula. - Would continue with IV Lasix  at this time for diuresis.  May consider right thoracentesis for recurrent pleural effusion for symptom  management. - Home GDMT has been held due to renal function. - Blood cultures found to be positive for MSSA. - Suspect minimal and flat troponin elevation most consistent with demand/supply mismatch and not ACS in the setting of above. - Palliative medicine has been consulted to discuss goals of care with patient and son.  Appreciate their assistance.  Patient was seen this afternoon with Dr. Florencio at which time it was discussed that overall his prognosis appears quite poor at this juncture.  Patient's son expressed understanding.  This patient's plan of care was discussed and created with Dr. Florencio and he is in agreement.  Signed: Danita Bloch, PA-C  08/19/2024, 11:14 AM [facility]      "

## 2024-08-19 NOTE — Consult Note (Signed)
 "                                                                                   Consultation Note Date: 08/19/2024 at 1130  Patient Name: [name]  DOB: 1946-02-18  MRN: [id number]  Age / Sex: 79 y.o., male  PCP: [name], MD Referring Physician: Cosette Blackwater, MD  HPI/Patient Profile: 79 y.o. male  with past medical history of HTN, HLD, CAD s/p CABG, type 2 diabetes, CKD (stage IIIb), CHF (recent EF 60-65%), and severe pulmonary HTN admitted on 08/18/2024 with increasing weakness and dyspnea.  Patient has had 4 inpatient admissions and 1 ED visit in the last 6 months.  Most recent admission was 1229-08/15/2024 for acute exacerbation of heart failure.  Upon presentation to the ED, patient endorses he was weak and having difficulty with ambulation as well as pain in his hand/shoulder on his left side.  Vital signs were within normal limits and patient was hemodynamically stable.  Lab work revealed moderate leukocytosis-16.9, hemoglobin at baseline 8.6, AKI on CKD with creatinine 2.  5 3, WBC mildly elevated at 14.8.  Blood cultures revealed MSSA patient currently on cefazolin .  Radiographs of left hand revealed small foreign body in soft tissues of left middle finger anterior to the middle phalanx.  Patient is current being treated for failure to thrive, acute hypoxic respiratory failure, acute on chronic HFpEF, AKI on CKD, recurrent pleural effusions, MSSA bacteremia, chronic anemia, demand ischemia.  Patient is being followed by infectious disease, cardiology, PT/OT.  PMT was consulted to support patient and family with goals of care discussions  Clinical Assessment and Goals of Care: Extensive chart review completed prior to meeting patient including: -Labs-leukocytosis mildly improving with WBC at 14.8 down from 16.9, hemoglobin stable at 8.5, AKI remaining with creatinine at 2.66 up from 2.53, troponin elevated at 159 and BNP at 28,444, glucose 211, -Vital signs: Patient is  hemodynamically stable with some soft blood pressures noted in the 7-8 AM hour-pressures have normalized with last reading at 110/52, patient remains on 5 L of nasal cannula with oxygen saturation reading 100% -Progress notes: Note reviewed from cardiology Dr. Florencio and PA Junette, PT/OT evaluations, hospitalist Dr. Cosette,  [facility] RN Bullins, and ID Dr. Fayette.   I then met with patient and his son [name] at bedside in ED to discuss diagnosis prognosis, GOC, EOL wishes, disposition and options.  I introduced Palliative Medicine as specialized medical care for people living with serious illness. It focuses on providing relief from the symptoms and stress of a serious illness. The goal is to improve quality of life for both the patient and the family.  Patient is familiar to me as I met with him during his November 2025 hospitalization.  As far as functional and nutritional status endorses he has felt significantly weak over the last few weeks.  His oral intake has been poor.  He is in such pain and he feels that is difficult to even raise his head off of the bed.  Patient's son shares patient has had very little to eat over the past few weeks.  He has been too weak to get out  of the out of bed since discharge home from the hospital on Saturday.  [name] shares has been a significant decline since discharge on Saturday as patient was able to walk into his house on Saturday.  And now, he is unable to even lift his head off the bed without experiencing pain and discomfort.  Reviewed past medical history of osteoarthritis as well as acute issue of cellulitis of left arm.  Additionally, discussed patient's chronic and ongoing respiratory compromise and HF.  Highlighted sepsis patient is having AKI on CKD and therefore we have to be careful to avoid nephrotoxic agents.  However, we want to appropriately address his volume overload and hopefully relieve some of the acute respiratory issues he is  having.  Space and opportunity provided for patient and son to ask questions and voice concerns.  Symptoms assessed.  Patient complains of a very dry mouth which makes it difficult for him to want to eat or drink anything.  Discussed use of Lasix  is removing fluid to address volume overload.  However, side effect of this medication is that it may cause dry mouth and feels symptoms of dehydration.  Reviewed utilizing oral moisturizer at least every 2 hours.  Orders placed.  Encouraged increased oral intake and aggressive oral care.  Patient was appreciative.  Pain assessed.  Patient endorses he has significant generalized pain.  He endorses that his left arm is swollen and that it is not painful to touch.  He shares the pain in his shoulder as well as his arm prevent him from feeling like he can move at all.  Home medications reviewed.  Current pain medication discussed.  Advised that NSAIDs, steroids, and morphine  cannot be utilized as they are contraindicated with his AKI and bacteremia at this time.  Adjusted medications to have Tylenol  scheduled as well as Dilaudid  for as needed breakthrough.  Patient was appreciative.  I attempted to elicit values and goals important to patient and his son.  Patient's son shares she does not want his father to be in pain.  Patient states that he wants to continue to fight as long as I can.  I shared my concern that while patient's mentation is clear and his goals for his future are stat, his earthly body is showing us  that it is starting to deteriorate.  Expressed concern that patient's heart failure coupled with his bacteremia have significantly challenged his respiratory, cardiac, and kidneys.  From previous conversations with patient, I understand that he would be accepting of all offered, available, and appropriate medical interventions to sustain his life.  However, given his poor functional status frailty and acute infection, I encouraged him to reconsider his  CODE STATUS and boundaries of medical treatments.  In the event the patient suffers a cardiac arrest, I advised that it would not be my recommendation that we proceed with CPR/ACLS protocol.  Discussed that this may traumatize patient and would not provide an improved outcome.  He shares he will consider it.  For now, full code and full scope remain.  Patient and son accepting for PMT to continue to follow and support.  I plan to follow-up with patient and family tomorrow to continue goals of care discussions.  Adjustment to Tylenol  made.  No further changes to plan of care at this time.  Primary Decision Maker PATIENT  Physical Exam Constitutional:      General: He is not in acute distress.    Appearance: He is normal weight.  HENT:  Head: Normocephalic.     Mouth/Throat:     Mouth: Mucous membranes are dry.  Eyes:     Pupils: Pupils are equal, round, and reactive to light.  Cardiovascular:     Rate and Rhythm: Normal rate.  Abdominal:     Palpations: Abdomen is soft.  Skin:    General: Skin is warm.     Coloration: Skin is pale.  Neurological:     Mental Status: He is alert and oriented to person, place, and time.  Psychiatric:        Behavior: Behavior normal.     Palliative Assessment/Data: 40%     Thank you for this consult. Palliative medicine will continue to follow and assist holistically.   I personally spent a total of 75 minutes in the care of the patient today including preparing to see the patient, getting/reviewing separately obtained history, performing a medically appropriate exam/evaluation, counseling and educating, and documenting clinical information in the EHR.   Signed by: Lamarr Gunner, DNP, FNP-BC Palliative Medicine   Please contact Palliative Medicine Team providers via [facility] for questions and concerns.                "

## 2024-08-19 NOTE — ED Notes (Signed)
 Alert and oriented X4 Resonds appropraitely to querstion. Family in room. Help position for comfort. Complains of left hand hurting noted swelling elevated on pillows and has a stong radial pulse.. States he hurts all over.  VSS.

## 2024-08-19 NOTE — ED Notes (Addendum)
 Pt stated they were having a hard time breathing. SpO2 was reading at 91%. This RN turned O2 from 4L to 5L. Pt now satting at 100%. Will continue on 5LNC. Pt has no other requests or concerns at this time.

## 2024-08-19 NOTE — Progress Notes (Signed)
 " PROGRESS NOTE    [name]  [name] DOB: 1946/04/27 DOA: 08/18/2024 PCP: Jerry Manna, MD  Subjective: No acute events overnight. Seen and examined at bedside with son present. Reports feeling slightly better with improvement in shortness of breath. Unable to provide much history of present illness due to altered mentation. Denies nausea, vomiting, constipation.   Hospital Course:  79 y.o. year old male with medical history of hypertension, hyperlipidemia complicated by CAD status post CABG, type 2 diabetes, CHF (EF 60-65%) and 02/2024, CKD 3B, and severe pulmonary hypertension presenting to the ED with increasing weakness, and dyspnea.  Patient with multiple recent admissions with the most recent admission from 08/10/2024 - 08/15/2024 for acute heart failure exacerbation.  States he has been weak and having difficulty with ambulation.  He is also having pain in his hand along with his shoulders.  He denies any URI symptoms but states his breathing has worsened since leaving the hospital.  Denies any fevers or chills.  On arrival to the ED patient was noted to be HDS stable.  Lab work and imaging obtained.  CBC with moderate leukocytosis at 16.9, hemoglobin at baseline at 8.6, thrombocytopenia that is lower than recent platelet count.  CMP with moderate hyperglycemia, AKI on CKD 3B, hypercalcemia at 11.2, elevated ALP and bilirubin level.  Respiratory panel negative for COVID, flu, RSV.  Troponin mildly elevated but downtrending.  proBNP significantly elevated from 8 days ago and has doubled since then going from 15k to 28k.  Chest x-ray with diffuse interstitial prominence concerning for edema along with moderate right pleural effusion and small left pleural effusion. [facility] contacted for admission.    Assessment and Plan:  Recurrent acute hypoxic respiratory failure Acute CHFpEF exacerbation  Aortic stenosis status post TAVR - coming in with worsening dyspnea and volume overload.    - Cont IV Furosemide  60 mg BID  - hold home GDMT due to AKI - Trend BMP, follow mag (goal K>4 and Mag>2) - Strict I&Os - Daily Weights - Repeat echo pending - monitor on tele - Cardiology following  - Palliative consulted, awaiting recs   AKI on CKD 3B:  - Likely in the setting of venous congestion.   - Cr 2.66, baseline Cr unclear maybe 1.6-1.9 - IV Lasix  as above.   - Trend creatinine, renally dose medications and avoid nephrotoxic agents.  - If declining with diuresis, will consult nephrology.  MSSA bacteremia Left hand cellulitis DDx includes cellulitis versus gout  L hand xray with no acute pathology, superficial foreign body in soft tissues of L middle finger anterior to middle phalanx LUE venous US  negative for acute DVT Bcx 1/6 showed MSSA Cont cefazolin  Repeat blood cultures ordered and pending TTE pending ID consulted and following  Orthopedic surgery consulted, awaiting recs  L Shoulder Pain Osteoarthritis - pt with left shoulder pain with ROM.  - Xray L shoulder with OA. - orthopedic surgery consulted due to concerns of septic arthritis with MSSA bacteremia   Elevated LFTs:  May be secondary to hepatic congestion in setting of volume overload.  - IV diuresis as elsewhere - Will trend.   Macrocytic anemia:  - Hgb 8.5, baseline 7-8  - Cardiology recommends hemoglobin goal greater than 8 and currently it is at 8.6.   - folic acid  and B12 not low   Hypertension:  - Hold home coreg  , amlodipine     Hyperlipidemia: Holding home statin given elevated LFTs.   Paroxysmal atrial fibrillation:  - In sinus rhythm.   -  Will hold home ASA given AKI .   - hold home Coreg   - cardiology following   Type 2 diabetes mellitus:  - Holding home Jardiance  given AKI - cont on SSI here.   Generalized weakness: PT and OT following  DVT prophylaxis: heparin  injection 5,000 Units Start: 08/18/24 2200  SQ Heparin    Code Status: Full Code Family Communication: updated  son at bedside Disposition Plan: TBD pending clinical course Reason for continuing need for hospitalization: monitor for clinical improvement, IV diuresis, cardiology and palliative team recommendations, IV antibiotics, TTE pending, ID team recommendations  Objective: Vitals:   08/19/24 0555 08/19/24 0855 08/19/24 1150 08/19/24 1430  BP:  (!) 109/53 (!) 109/55 (!) 116/54  Pulse: 81 77 71 63  Resp: 20 20 (!) 21 (!) 23  Temp:  97.8 F (36.6 C) 97.8 F (36.6 C)   TempSrc:  Oral Axillary   SpO2: 100% 100% 100% 100%  Weight:      Height:        Intake/Output Summary (Last 24 hours) at 08/19/2024 1544 Last data filed at 08/18/2024 2236 Gross per 24 hour  Intake 3 ml  Output --  Net 3 ml   Filed Weights   08/18/24 1850 08/18/24 2037  Weight: 81 kg 86.2 kg    Examination:  Physical Exam Vitals and nursing note reviewed.  Constitutional:      General: He is not in acute distress.    Appearance: He is ill-appearing.  HENT:     Head: Normocephalic and atraumatic.  Cardiovascular:     Rate and Rhythm: Normal rate and regular rhythm.     Pulses: Normal pulses.     Heart sounds: Normal heart sounds.  Pulmonary:     Effort: Pulmonary effort is normal.     Breath sounds: Normal breath sounds.  Abdominal:     General: Bowel sounds are normal.     Palpations: Abdomen is soft.  Musculoskeletal:     Right lower leg: Edema present.     Left lower leg: Edema present.  Neurological:     Mental Status: He is alert.     Data Reviewed: I have personally reviewed following labs and imaging studies  CBC: Recent Labs  Lab 08/13/24 0359 08/13/24 1417 08/15/24 0427 08/18/24 1641 08/19/24 0425  WBC 5.9  --  11.3* 16.9* 14.8*  NEUTROABS  --   --   --  15.7*  --   HGB 7.1* 8.0* 8.1* 8.6* 8.5*  HCT 22.5* 24.6* 25.6* 27.4* 27.1*  MCV 101.8*  --  101.2* 100.0 100.4*  PLT 94*  --  112* 86* 71*   Basic Metabolic Panel: Recent Labs  Lab 08/13/24 0359 08/14/24 0448 08/15/24 0427  08/18/24 1641 08/19/24 0425  NA 137 139 139 136 136  K 4.3 4.6 3.9 4.9 4.8  CL 99 99 99 94* 94*  CO2 31 34* 34* 30 32  GLUCOSE 155* 168* 153* 203* 211*  BUN 43* 46* 46* 69* 76*  CREATININE 1.85* 1.95* 1.87* 2.53* 2.66*  CALCIUM  10.2 10.1 10.3 11.2* 10.4*  MG 2.2 2.1  --  2.0  --   PHOS  --  3.3  --   --   --    GFR: Estimated Creatinine Clearance: 26.7 mL/min (A) (by C-G formula based on SCr of 2.66 mg/dL (H)). Liver Function Tests: Recent Labs  Lab 08/14/24 0448 08/18/24 1641 08/19/24 0425  AST  --  31 23  ALT  --  18 15  ALKPHOS  --  134* 115  BILITOT  --  2.5* 1.9*  PROT  --  7.4 6.6  ALBUMIN 3.5 3.4* 3.0*   No results for input(s): LIPASE, AMYLASE in the last 168 hours. No results for input(s): AMMONIA in the last 168 hours. Coagulation Profile: No results for input(s): INR, PROTIME in the last 168 hours. Cardiac Enzymes: No results for input(s): CKTOTAL, CKMB, CKMBINDEX, TROPONINI in the last 168 hours. ProBNP, BNP (last 5 results) Recent Labs    08/22/23 0730 10/16/23 1415 03/02/24 1314 07/01/24 1220 07/18/24 1707 08/10/24 0732 08/18/24 1641  PROBNP  --   --   --  12,167.0* 13,853.0* 15,340.0* 28,444.0*  BNP 1,069.2* 942.6* 1,183.2*  --   --   --   --    HbA1C: No results for input(s): HGBA1C in the last 72 hours. CBG: Recent Labs  Lab 08/19/24 0026 08/19/24 0723 08/19/24 0857 08/19/24 1116  GLUCAP 303* 265* 261* 233*   Lipid Profile: No results for input(s): CHOL, HDL, LDLCALC, TRIG, CHOLHDL, LDLDIRECT in the last 72 hours. Thyroid  Function Tests: No results for input(s): TSH, T4TOTAL, FREET4, T3FREE, THYROIDAB in the last 72 hours. Anemia Panel: Recent Labs    08/18/24 2210  VITAMINB12 1,236*  FOLATE 12.3   Sepsis Labs: No results for input(s): PROCALCITON, LATICACIDVEN in the last 168 hours.  Recent Results (from the past 240 hours)  Resp panel by RT-PCR (RSV, Flu A&B, Covid) Anterior  Nasal Swab     Status: None   Collection Time: 08/18/24  4:41 PM   Specimen: Anterior Nasal Swab  Result Value Ref Range Status   SARS Coronavirus 2 by RT PCR NEGATIVE NEGATIVE Final    Comment: (NOTE) SARS-CoV-2 target nucleic acids are NOT DETECTED.  The SARS-CoV-2 RNA is generally detectable in upper respiratory specimens during the acute phase of infection. The lowest concentration of SARS-CoV-2 viral copies this assay can detect is 138 copies/mL. A negative result does not preclude SARS-Cov-2 infection and should not be used as the sole basis for treatment or other patient management decisions. A negative result may occur with  improper specimen collection/handling, submission of specimen other than nasopharyngeal swab, presence of viral mutation(s) within the areas targeted by this assay, and inadequate number of viral copies(<138 copies/mL). A negative result must be combined with clinical observations, patient history, and epidemiological information. The expected result is Negative.  Fact Sheet for Patients:  bloggercourse.com  Fact Sheet for Healthcare Providers:  seriousbroker.it  This test is no t yet approved or cleared by the [place]  FDA and  has been authorized for detection and/or diagnosis of SARS-CoV-2 by FDA under an Emergency Use Authorization (EUA). This EUA will remain  in effect (meaning this test can be used) for the duration of the COVID-19 declaration under Section 564(b)(1) of the Act, 21 U.S.C.section 360bbb-3(b)(1), unless the authorization is terminated  or revoked sooner.       Influenza A by PCR NEGATIVE NEGATIVE Final   Influenza B by PCR NEGATIVE NEGATIVE Final    Comment: (NOTE) The Xpert Xpress SARS-CoV-2/FLU/RSV plus assay is intended as an aid in the diagnosis of influenza from Nasopharyngeal swab specimens and should not be used as a sole basis for treatment. Nasal washings  and aspirates are unacceptable for Xpert Xpress SARS-CoV-2/FLU/RSV testing.  Fact Sheet for Patients: bloggercourse.com  Fact Sheet for Healthcare Providers: seriousbroker.it  This test is not yet approved or cleared by the [place]  FDA and has been authorized for detection and/or diagnosis of SARS-CoV-2 by  FDA under an Emergency Use Authorization (EUA). This EUA will remain in effect (meaning this test can be used) for the duration of the COVID-19 declaration under Section 564(b)(1) of the Act, 21 U.S.C. section 360bbb-3(b)(1), unless the authorization is terminated or revoked.     Resp Syncytial Virus by PCR NEGATIVE NEGATIVE Final    Comment: (NOTE) Fact Sheet for Patients: bloggercourse.com  Fact Sheet for Healthcare Providers: seriousbroker.it  This test is not yet approved or cleared by the [place]  FDA and has been authorized for detection and/or diagnosis of SARS-CoV-2 by FDA under an Emergency Use Authorization (EUA). This EUA will remain in effect (meaning this test can be used) for the duration of the COVID-19 declaration under Section 564(b)(1) of the Act, 21 U.S.C. section 360bbb-3(b)(1), unless the authorization is terminated or revoked.  Performed at [facility], [address] [place], [place] [address]   Culture, blood (Routine X 2) w Reflex to ID Panel     Status: None (Preliminary result)   Collection Time: 08/18/24 10:33 PM   Specimen: BLOOD  Result Value Ref Range Status   Specimen Description BLOOD RIGHT ANTECUBITAL  Final   Special Requests   Final    BOTTLES DRAWN AEROBIC AND ANAEROBIC Blood Culture adequate volume   Culture  Setup Time   Final    GRAM POSITIVE COCCI IN BOTH AEROBIC AND ANAEROBIC BOTTLES Organism ID to follow CRITICAL RESULT CALLED TO, READ BACK BY AND VERIFIED WITH: ESTILL LUTES 08/19/24 0915 MW Performed at  [facility], [address] [place], [place] [address]    Culture GRAM POSITIVE COCCI  Final   Report Status PENDING  Incomplete  Blood Culture ID Panel (Reflexed)     Status: Abnormal   Collection Time: 08/18/24 10:33 PM  Result Value Ref Range Status   Enterococcus faecalis NOT DETECTED NOT DETECTED Final   Enterococcus Faecium NOT DETECTED NOT DETECTED Final   Listeria monocytogenes NOT DETECTED NOT DETECTED Final   Staphylococcus species DETECTED (A) NOT DETECTED Final    Comment: CRITICAL RESULT CALLED TO, READ BACK BY AND VERIFIED WITH: SHEEMA HALLAJI 08/19/24 0915 MW    Staphylococcus aureus (BCID) DETECTED (A) NOT DETECTED Final    Comment: CRITICAL RESULT CALLED TO, READ BACK BY AND VERIFIED WITH: SHEEMA HALLAJI 08/19/24 0915 MW    Staphylococcus epidermidis NOT DETECTED NOT DETECTED Final   Staphylococcus lugdunensis NOT DETECTED NOT DETECTED Final   Streptococcus species NOT DETECTED NOT DETECTED Final   Streptococcus agalactiae NOT DETECTED NOT DETECTED Final   Streptococcus pneumoniae NOT DETECTED NOT DETECTED Final   Streptococcus pyogenes NOT DETECTED NOT DETECTED Final   A.calcoaceticus-baumannii NOT DETECTED NOT DETECTED Final   Bacteroides fragilis NOT DETECTED NOT DETECTED Final   Enterobacterales NOT DETECTED NOT DETECTED Final   Enterobacter cloacae complex NOT DETECTED NOT DETECTED Final   Escherichia coli NOT DETECTED NOT DETECTED Final   Klebsiella aerogenes NOT DETECTED NOT DETECTED Final   Klebsiella oxytoca NOT DETECTED NOT DETECTED Final   Klebsiella pneumoniae NOT DETECTED NOT DETECTED Final   Proteus species NOT DETECTED NOT DETECTED Final   Salmonella species NOT DETECTED NOT DETECTED Final   Serratia marcescens NOT DETECTED NOT DETECTED Final   Haemophilus influenzae NOT DETECTED NOT DETECTED Final   Neisseria meningitidis NOT DETECTED NOT DETECTED Final   Pseudomonas aeruginosa NOT DETECTED NOT DETECTED Final   Stenotrophomonas  maltophilia NOT DETECTED NOT DETECTED Final   Candida albicans NOT DETECTED NOT DETECTED Final   Candida auris  NOT DETECTED NOT DETECTED Final   Candida glabrata NOT DETECTED NOT DETECTED Final   Candida krusei NOT DETECTED NOT DETECTED Final   Candida parapsilosis NOT DETECTED NOT DETECTED Final   Candida tropicalis NOT DETECTED NOT DETECTED Final   Cryptococcus neoformans/gattii NOT DETECTED NOT DETECTED Final   Meth resistant mecA/C and MREJ NOT DETECTED NOT DETECTED Final    Comment: Performed at [facility], [address] [place], [place] [address]     Radiology Studies: ECHOCARDIOGRAM COMPLETE Result Date: 08/19/2024    ECHOCARDIOGRAM REPORT   Patient Name:   MUATH HALLAM [facility] Date of Exam: 08/18/2024 Medical Rec #:  [id number]        Height:       73.0 in Accession #:    [id number]       Weight:       190.0 lb Date of Birth:  December 02, 1945         BSA:          2.105 m Patient Age:    78 years         BP:           124/76 mmHg Patient Gender: M                HR:           115 bpm. Exam Location:  [facility] Procedure: 2D Echo, Cardiac Doppler and Color Doppler (Both Spectral and Color            Flow Doppler were utilized during procedure). Indications:     I50.21 Acute Systolic CHF  History:         Patient has prior history of Echocardiogram examinations, most                  recent 03/03/2024. CHF, CAD, Prior CABG and TAVR-01/23/24,                  Arrythmias:LBBB, RBBB and Atrial Fibrillation,                  Signs/Symptoms:Murmur and Dyspnea; Risk Factors:Hypertension,                  Diabetes and Dyslipidemia.  Sonographer:     Carl Coma RDCS Referring Phys:  [id number] MORENE BATHE Diagnosing Phys: Dwayne D Callwood MD IMPRESSIONS  1. Left ventricular ejection fraction, by estimation, is 60 to 65%. The left ventricle has normal function. The left ventricle has no regional wall motion abnormalities. There is severe concentric left ventricular hypertrophy. Left ventricular  diastolic  parameters are consistent with Grade III diastolic dysfunction (restrictive). There is the interventricular septum is flattened in diastole ('D' shaped left ventricle), consistent with right ventricular volume overload.  2. Right ventricular systolic function is moderately reduced. The right ventricular size is moderately enlarged. Mildly increased right ventricular wall thickness.  3. Left atrial size was severely dilated.  4. Right atrial size was moderately dilated.  5. The mitral valve is grossly normal. Moderate mitral valve regurgitation. Moderate mitral annular calcification.  6. Tricuspid valve regurgitation is moderate to severe.  7. The aortic valve is grossly normal. Aortic valve regurgitation is mild. Aortic valve sclerosis/calcification is present, without any evidence of aortic stenosis. 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 FINDINGS  Left Ventricle: Left ventricular ejection fraction, by estimation, is 60 to 65%. The left ventricle has normal function. The left ventricle has no regional wall motion  abnormalities. Strain was performed and the global longitudinal strain is indeterminate. The left ventricular internal cavity size was normal in size. There is severe concentric left ventricular hypertrophy. The interventricular septum is flattened in diastole ('D' shaped left ventricle), consistent with right ventricular volume overload. Left ventricular diastolic parameters are consistent with Grade III diastolic dysfunction (restrictive). Right Ventricle: The right ventricular size is moderately enlarged. Mildly increased right ventricular wall thickness. Right ventricular systolic function is moderately reduced. Left Atrium: Left atrial size was severely dilated. Right Atrium: Right atrial size was moderately dilated. Pericardium: There is no evidence of pericardial effusion. Mitral Valve: The mitral valve is grossly normal. There is  moderate thickening of the mitral valve leaflet(s). There is moderate calcification of the mitral valve leaflet(s). Moderate mitral annular calcification. Moderate mitral valve regurgitation. Tricuspid Valve: The tricuspid valve is grossly normal. Tricuspid valve regurgitation is moderate to severe. The aortic valve is grossly normal. Aortic valve regurgitation is mild. Aortic valve sclerosis/calcification is present, without any evidence of aortic stenosis. 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 Pulmonic Valve: The pulmonic valve was normal in structure. Pulmonic valve regurgitation is not visualized. Aorta: The aortic root was not well visualized. IAS/Shunts: No atrial level shunt detected by color flow Doppler. Additional Comments: 3D was performed not requiring image post processing on an independent workstation and was indeterminate.  LEFT VENTRICLE PLAX 2D LVIDd:         4.50 cm LVIDs:         3.20 cm LV PW:         1.80 cm LV IVS:        1.80 cm  RIGHT VENTRICLE            IVC RV Basal diam:  4.90 cm    IVC diam: 2.10 cm RV S prime:     6.74 cm/s TAPSE (M-mode): 1.2 cm LEFT ATRIUM             Index        RIGHT ATRIUM           Index LA diam:        5.30 cm 2.52 cm/m   RA Area:     28.30 cm LA Vol (A2C):   94.4 ml 44.84 ml/m  RA Volume:   102.00 ml 48.45 ml/m LA Vol (A4C):   92.0 ml 43.70 ml/m LA Biplane Vol: 97.0 ml 46.08 ml/m  AORTIC VALVE AV Vmax:           302.00 cm/s AV Vmean:          212.200 cm/s AV VTI:            0.447 m AV Peak Grad:      36.5 mmHg AV Mean Grad:      20.6 mmHg LVOT Vmax:         81.03 cm/s LVOT Vmean:        52.067 cm/s LVOT VTI:          0.135 m LVOT/AV VTI ratio: 0.30 AI PHT:            277 msec  AORTA Ao Root diam: 3.40 cm Ao Asc diam:  3.90 cm MR Peak grad: 112.8 mmHg    TRICUSPID VALVE MR Vmax:      531.00 cm/s   TR Peak grad:   58.7 mmHg MV E velocity:  163.25 cm/s  TR Vmax:        383.00 cm/s                              SHUNTS                             Systemic VTI: 0.14 m Cara JONETTA Lovelace MD Electronically signed by Cara JONETTA Lovelace MD Signature Date/Time: 08/19/2024/12:52:25 PM    Final    DG Hand Complete Left Result Date: 08/19/2024 EXAM: 3 OR MORE VIEW(S) XRAY OF THE LEFT HAND 08/19/2024 12:49:00 AM COMPARISON: 08/18/2024. CLINICAL HISTORY: Swelling. FINDINGS: BONES AND JOINTS: No acute fracture, subluxation or dislocation. No malalignment. Calcifications about the left wrist, likely synovial calcifications. Arthritic changes throughout the left wrist. SOFT TISSUES: Small radiopaque foreign body noted in the soft tissues of the left middle finger anterior to the middle phalanx. IMPRESSION: 1. Small radiopaque foreign body in the soft tissues of the left middle finger anterior to the middle phalanx. 2. No acute fracture, subluxation, or dislocation. Electronically signed by: Franky Crease MD 08/19/2024 01:00 AM EST RP Workstation: [id number]   US  Venous Img Upper Uni Left (DVT) Result Date: 08/19/2024 CLINICAL DATA:  Swelling EXAM: Left UPPER EXTREMITY VENOUS DOPPLER ULTRASOUND TECHNIQUE: Gray-scale sonography with graded compression, as well as color Doppler and duplex ultrasound were performed to evaluate the upper extremity deep venous system from the level of the subclavian vein and including the jugular, axillary, basilic, radial, ulnar and upper cephalic vein. Spectral Doppler was utilized to evaluate flow at rest and with distal augmentation maneuvers. COMPARISON:  None Available. FINDINGS: Contralateral Subclavian Vein: Respiratory phasicity is normal and symmetric with the symptomatic side. No evidence of thrombus. Internal Jugular Vein: No evidence of thrombus. Normal compressibility, respiratory phasicity and response to augmentation. Subclavian Vein: No evidence of thrombus. Normal respiratory phasicity and response to augmentation. Axillary  Vein: No evidence of thrombus. Normal compressibility, respiratory phasicity and response to augmentation. Cephalic Vein: No evidence of thrombus. Normal compressibility, respiratory phasicity and response to augmentation. Basilic Vein: No evidence of thrombus. Normal compressibility, respiratory phasicity and response to augmentation. Brachial Veins: No evidence of thrombus. Normal compressibility, respiratory phasicity and response to augmentation. Radial Veins: No evidence of thrombus. Normal compressibility, respiratory phasicity and response to augmentation. Ulnar Veins: No evidence of thrombus. Normal compressibility, respiratory phasicity and response to augmentation. IMPRESSION: No evidence of DVT within the left upper extremity. Electronically Signed   By: Luke Bun M.D.   On: 08/19/2024 00:31   DG Shoulder Left Result Date: 08/18/2024 EXAM: 1 VIEW(S) XRAY OF THE LEFT SHOULDER 08/18/2024 07:58:00 PM COMPARISON: None available. CLINICAL HISTORY: Shoulder pain, left. FINDINGS: BONES AND JOINTS: Glenohumeral joint is normally aligned. No acute fracture. No malalignment. 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 SOFT TISSUES: No abnormal calcifications. Visualized lung is unremarkable. IMPRESSION: 1. 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 Electronically signed by: Elsie Gravely MD 08/18/2024 08:12 PM EST RP Workstation: [id number]   DG Hand 2 View Left Result Date: 08/18/2024 EXAM: 1 or 2 VIEW(S) XRAY OF THE LEFT HAND 08/18/2024 07:58:00 PM COMPARISON: None available. CLINICAL HISTORY: Pain FINDINGS: BONES AND JOINTS: Prominent degenerative changes in the interphalangeal joints, first carpometacarpal and metacarpophalangeal joints, radiocarpal, radioulnar, and intercarpal joints. Old healed fracture deformity of the distal radius. Subcortical cyst in the base of the 1st metacarpal, likely degenerative cyst. SOFT TISSUES: Soft  tissue  calcifications in and around the wrist likely representing synovial calcifications. Dorsal soft tissue swelling. Tiny metallic foreign body suggested in the soft tissues of the third finger adjacent to the middle phalanx. IMPRESSION: 1. Dorsal soft tissue swelling. 2. Tiny metallic foreign body suggested in the soft tissues of the third finger adjacent to the middle phalanx. 3. Prominent degenerative changes in the interphalangeal joints, first carpometacarpal and metacarpophalangeal joints, radiocarpal, radioulnar, and intercarpal joints. 4. Soft tissue calcifications in and around the wrist, likely representing synovial calcifications. 5. Old healed fracture deformity of the distal radius. Electronically signed by: Elsie Gravely MD 08/18/2024 08:11 PM EST RP Workstation: [id number]   DG Chest Portable 1 View Result Date: 08/18/2024 EXAM: 1 VIEW(S) XRAY OF THE CHEST 08/18/2024 04:53:00 PM COMPARISON: 08/10/2024 CLINICAL HISTORY: SOB FINDINGS: LUNGS AND PLEURA: Moderate right pleural effusion, stable compared to prior. Small left pleural effusion. Mild diffuse interstitial prominence. Bibasilar airspace opacities, likely compressive atelectasis. No pneumothorax. HEART AND MEDIASTINUM: Enlarged cardiomediastinal silhouette, unchanged. Median sternotomy and TAVR noted. Atherosclerotic calcifications. BONES AND SOFT TISSUES: No acute osseous abnormality. IMPRESSION: 1. Moderate right pleural effusion and small left pleural effusion. 2. Mild diffuse interstitial prominence and bibasilar airspace opacities, likely compressive atelectasis. Electronically signed by: Greig Pique MD 08/18/2024 05:32 PM EST RP Workstation: [id number]    Scheduled Meds:  acetaminophen   500 mg Oral TID   Or   acetaminophen   325 mg Rectal TID   furosemide   60 mg Intravenous BID   heparin   5,000 Units Subcutaneous Q8H   insulin  aspart  0-15 Units Subcutaneous TID WC   insulin  aspart  0-5 Units Subcutaneous QHS   sodium chloride   flush  3 mL Intravenous Q12H   Continuous Infusions:   ceFAZolin  (ANCEF ) IV Stopped (08/19/24 0942)     LOS: 1 day   Norval Bar, MD  [facility]  08/19/2024, 3:44 PM   "

## 2024-08-19 NOTE — ED Notes (Signed)
 Pain all over rates pain an 9. Dilaudid  .5mg  given IV.

## 2024-08-19 NOTE — Evaluation (Signed)
 Occupational Therapy Evaluation Patient Details Name: [name] MRN: [id number] DOB: 1945/09/23 Today's Date: 08/19/2024   History of Present Illness   79 y.o. year old male presenting to the ED with increasing weakness, hand and shoulder pain, difficulty ambulating and dyspnea. Multiple recent admissions with the most recent admission from 08/10/24-08/15/24 for acute heart failure exacerbation. PMH of hypertension, hyperlipidemia complicated by CAD status post CABG, type 2 diabetes, CHF (EF 60-65%) and 02/2024, CKD 3B, and severe pulmonary hypertension     Clinical Impressions Pt was seen for OT/PT co-evaluation this date. PTA, pt resides at home with spouse and family assist. He is typically MOD I/IND with ADLs and mobility with PRN use of SPC. Family assists with IADLs as needed. Pt is on 3L 02 at baseline. Pt presents with deficits in strength, activity tolerance and pain management limiting their ability to perform ADL management at baseline level. Pt currently requires Max A +2 for all rolling in bed and repositioning after purewick malfunction requiring full linen change. Total assist for peri-care/LB bathing and Max A for washing face at bed level d/t inability to use tender/painful L hand and lab in to draw blood on RUE. Pt is able to briefly grip bed rail with L hand, but is very tender to touch on L shoulder/hand/UE throughout session. He is far from his baseline function and would benefit from skilled OT services to address noted impairments and functional limitations to maximize safety and independence while minimizing future risk of falls, injury, and readmission. Do anticipate the need for follow up OT services upon acute hospital DC.      If plan is discharge home, recommend the following:   A lot of help with bathing/dressing/bathroom;A lot of help with walking and/or transfers;Two people to help with walking and/or transfers;Help with stairs or ramp for entrance;Assistance  with cooking/housework     Functional Status Assessment   Patient has had a recent decline in their functional status and demonstrates the ability to make significant improvements in function in a reasonable and predictable amount of time.     Equipment Recommendations   Other (comment) (defer)     Recommendations for Other Services         Precautions/Restrictions   Precautions Precautions: Fall Recall of Precautions/Restrictions: Intact Precaution/Restrictions Comments: monitor O2 Restrictions Weight Bearing Restrictions Per Provider Order: No     Mobility Bed Mobility Overal bed mobility: Needs Assistance Bed Mobility: Rolling Rolling: Max assist, +2 for physical assistance, Used rails         General bed mobility comments: rolled to bil sides in bed for linen change from purewick malfunction/leakage; Max A +1-2 to roll in bed d/t inability to utilize LUE/HAND and very tender to touch although able to briefly hold rail with L hand; +2 to reposition in bed for upright position/comfort    Transfers                   General transfer comment: deferred d/t weakness      Balance Overall balance assessment: Mild deficits observed, not formally tested                                         ADL either performed or assessed with clinical judgement   ADL Overall ADL's : Needs assistance/impaired     Grooming: Maximal assistance;Wash/dry face Grooming Details (indicate cue type and reason):  unable to use LUE d/t swollen and tender, lab in getting blood from RUE                     Toileting- Clothing Manipulation and Hygiene: Total assistance;Bed level Toileting - Clothing Manipulation Details (indicate cue type and reason): soiled linens from [employer] malfunction/leakage             Vision         Perception         Praxis         Pertinent Vitals/Pain Pain Assessment Pain Assessment: Faces Faces Pain  Scale: Hurts whole lot Pain Location: R hand and shoulder Pain Descriptors / Indicators: Tender, Sore Pain Intervention(s): Monitored during session, Repositioned     Extremity/Trunk Assessment Upper Extremity Assessment Upper Extremity Assessment: Generalized weakness;LUE deficits/detail LUE Deficits / Details: very tender and swollen, able to grip bed rail but does not like to be touched   Lower Extremity Assessment Lower Extremity Assessment: Generalized weakness       Communication Communication Communication: No apparent difficulties   Cognition Arousal: Alert Behavior During Therapy: WFL for tasks assessed/performed (can become irritated easily) Cognition: No apparent impairments                               Following commands: Intact       Cueing  General Comments   Cueing Techniques: Verbal cues  remains deconditioned/weak, very limited by LUE pain and tenderness   Exercises Other Exercises Other Exercises: Edu on role of OT in acute setting.   Shoulder Instructions      Home Living Family/patient expects to be discharged to:: Private residence Living Arrangements: Spouse/significant other Available Help at Discharge: Family Type of Home: House Home Access: Stairs to enter Secretary/administrator of Steps: 3 Entrance Stairs-Rails: Right;Left;Can reach both Home Layout: One level     Bathroom Shower/Tub: Producer, Television/film/video: Standard     Home Equipment: Cane - single Librarian, Academic (2 wheels);Rollator (4 wheels);Wheelchair - manual          Prior Functioning/Environment Prior Level of Function : Independent/Modified Independent             Mobility Comments: ambulates with SPC PRN, denies falls, on 3L home O2 now, gait distance limited by SOB & need to rest ADLs Comments: Ind with self care and family assists as needed with IADLs    OT Problem List: Decreased strength;Pain;Decreased activity  tolerance;Impaired balance (sitting and/or standing)   OT Treatment/Interventions: Self-care/ADL training;Therapeutic exercise;Therapeutic activities;Energy conservation;Patient/family education;Balance training;DME and/or AE instruction      OT Goals(Current goals can be found in the care plan section)   Acute Rehab OT Goals Patient Stated Goal: improve pain OT Goal Formulation: With patient Time For Goal Achievement: 09/02/24 Potential to Achieve Goals: Fair ADL Goals Pt Will Perform Grooming: with set-up;sitting Pt Will Perform Lower Body Dressing: with min assist;sitting/lateral leans;sit to/from stand;with contact guard assist Pt Will Transfer to Toilet: with min assist;with contact guard assist;ambulating   OT Frequency:  Min 2X/week    Co-evaluation PT/OT/SLP Co-Evaluation/Treatment: Yes Reason for Co-Treatment: For patient/therapist safety;To address functional/ADL transfers PT goals addressed during session: Mobility/safety with mobility OT goals addressed during session: ADL's and self-care      AM-PAC OT 6 Clicks Daily Activity     Outcome Measure Help from another person eating meals?: A Little Help from another person taking care  of personal grooming?: A Little Help from another person toileting, which includes using toliet, bedpan, or urinal?: A Lot Help from another person bathing (including washing, rinsing, drying)?: A Lot Help from another person to put on and taking off regular upper body clothing?: A Lot Help from another person to put on and taking off regular lower body clothing?: A Lot 6 Click Score: 14   End of Session Equipment Utilized During Treatment: Oxygen Nurse Communication: Mobility status  Activity Tolerance: Patient limited by fatigue;Patient limited by pain Patient left: with call bell/phone within reach;in bed;with bed alarm set;with family/visitor present  OT Visit Diagnosis: Other abnormalities of gait and mobility (R26.89);Muscle  weakness (generalized) (M62.81);Pain Pain - Right/Left: Left Pain - part of body: Hand;Shoulder                Time: 8881-8865 OT Time Calculation (min): 16 min Charges:  OT General Charges $OT Visit: 1 Visit OT Evaluation $OT Eval Moderate Complexity: 1 Mod Javarus Dorner Chrismon, OTR/L 08/19/2024, 1:43 PM  Eliodoro Gullett E Chrismon 08/19/2024, 1:43 PM

## 2024-08-19 NOTE — Consult Note (Signed)
 "                                                                ORTHOPAEDIC CONSULTATION  REQUESTING PHYSICIAN: Cosette Blackwater, MD  Chief Complaint:   R thumb pain, bilateral shoulder pain  History of Present Illness: History obtained from the patient and son at the bedside as well as review of the medical records and discussion with medical care team.  [name] is a 79 y.o. male with a history of aortic valve replacement, prior CABG, diabetes, OSA, hypothyroidism, paroxysmal atrial fibs, CHF, pulmonary hypertension, mitral regurgitation, CKD, and anemia who was admitted from the emergency department yesterday with weakness, fatigue, and.  This is his third admission over the past month with most recent discharge being 3 days prior to this recent admission.  He was most recently admitted for complications of CHF.  He was found to have MSSA bacteremia.  He was also noted to have left hand and distal forearm cellulitis and swelling.  He has been on IV antibiotics since admission for this.  He had a left upper extremity DVT scan that was negative for DVT.  He was also noted to have significant left shoulder pain as well as right shoulder pain.  There was concern for septic arthritis of the shoulders as well as of the left thumb, leading to orthopedic consultation.  Past Medical History:  Diagnosis Date   (HFpEF) heart failure with preserved ejection fraction (HCC) 07/25/2016   a.)TTE 07/25/16: EF >55, triv-mild pan regur, mild AS, G2DD; b.)TTE 09/02/16: EF >55, triv TR, mild AS; c.)TTE 11/05/16: EF >55, LAE, triv-mild pan regur, mild AS, G2DD; d.)TTE 01/21/20: EF >55, mod LVH, LAE/RVE, triv-mild AR/PR/TR, mod MR, mild AS, G2DD; e.)TTE 09/24/22: EF>55, sev LVH, sev LAE, RAE, mild PR, mod AR/MR/TR, mild-mod AS, RVSP 74.7; f.)TTE 12/23/22: EF 60-65, LAE, mild-mod MR/TR, mild AS   Abnormal urine 05/01/2023   Anemia    Aortic atherosclerosis    Aortic stenosis 07/25/2016   a.) TTE 07/25/2016: mild  (MPG 13.5); b.) TTE 09/02/2016: mild (MPG 16); c.) TTE 11/05/2016: mild (MPG 9); d.) TTE 01/21/2020: mild (MPG 11.7); e.) TTE 09/24/2022: mild-mod (MPG 20.3); f.) TTE 12/23/2022: mild (MPG 15)   Atrial fibrillation (HCC)    a.) CHA2DS2VASc = 6 (age x2, CHF, HTN, vascular disease history, T2DM);  b.) rate/rhythm maintained on oral labetalol ; chronically anticoagulated with apixaban  + clopidogrel    AV block, 1st degree    B12 deficiency    CKD (chronic kidney disease), stage III (HCC)    Colon polyps    Congestive heart failure (HCC) 05/01/2023   Coronary artery disease 08/16/2016   a.) s/p 3v CABG 08/30/2016; b.) s/p PCI 11/26/2016 (DES x 1 oSVG-RCA); c.) s/p PCI 12/17/2016 (DES x 5 --> mLCx x2, dLCx, pLAD, dLAD); c.) s/p PCI 06/09/2020 (DES x1 --> ISR oSVG-PDA)   Cyst of kidney, acquired 07/03/2023   Dyspnea    Gastroesophageal reflux disease 05/01/2023   GERD (gastroesophageal reflux disease)    Heart murmur    Hepatic steatosis    History of 2019 novel coronavirus disease (COVID-19) 09/02/2020   a.) Tx'd with remdesivir    History of blood transfusion    History of GI bleed 09/02/2020   a.) admitted to [facility]  09/02/2020 - 09/08/2020   History of heart artery stent 11/26/2016   TOTAL of 7 stents: a.) 11/26/2016 --> 3.5 x 22 mm Resolute Integrity oSVG-RCA; b.) 12/17/2016: 3.0 x 12 mm Xience Alpine dLCx, 3.5 x 22 mm Resolute Onyx mLCx, 3.5 x 18 mm Resolute Onyx mLCx, 3.0 x 26 mm Resolute Onyx pLAD, 2.0 x 26 mm Resolute Onyx dLAD; c.) 06/09/2020 --> 2.5 x 18 mm Resolute Onyx ISR oSVG-PDA   History of partial colectomy 08/12/2012   a.) large gastric hyperplastic polyp removal   HLD (hyperlipidemia)    Hypertension    LBBB (left bundle branch block)    Long term current use of anticoagulant    a.) apixaban    Long term current use of antithrombotics/antiplatelets    a.) clopidogrel    Male hypogonadism    a.) on exogenous TRT (1.62% gel)   Mild cardiomegaly    Mild gynecomastia (bilateral)     Nephrolithiasis    OSA (obstructive sleep apnea)    a.) does not utilize nocturnal PAP therapy   Personal history of surgery to heart and great vessels, presenting hazards to health 05/01/2023   Proteinuria, unspecified 08/01/2023   Pulmonary hypertension (HCC) 09/24/2022   a.) TTE 09/24/2022: RVSP 74.7; b.) TTE 12/23/2022: RVSP 31.3   RBBB (right bundle branch block with left anterior fascicular block)    Right inguinal hernia    Right thyroid  nodule    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   a.) LIMA-LAD, SVG-PDA, SVG-OM3   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/01/2023   Type 2 diabetes mellitus treated with insulin  (HCC)    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   Umbilical hernia    Unstable angina (HCC)    Vitamin D  deficiency    Past Surgical History:  Procedure Laterality Date   ANOMALOUS PULMONARY VENOUS RETURN REPAIR, TOTAL  2025   APPENDECTOMY     CARDIAC CATHETERIZATION Left 08/16/2016   Procedure: Left Heart Cath and Coronary Angiography;  Surgeon: Cara JONETTA Lovelace, MD;  Location: [facility]IVE [facility];  Service: Cardiovascular;  Laterality: Left;   COLONOSCOPY N/A 09/07/2020   Procedure: COLONOSCOPY;  Surgeon: Janalyn Keene NOVAK, MD;  Location: [facility] ENDOSCOPY;  Service: Endoscopy;  Laterality: N/A;   COLONOSCOPY N/A 10/19/2023   Procedure: COLONOSCOPY;  Surgeon: Jinny Carmine, MD;  Location: [facility] ENDOSCOPY;  Service: Endoscopy;  Laterality: N/A;   COLONOSCOPY WITH PROPOFOL  N/A 05/17/2021   Procedure: COLONOSCOPY WITH PROPOFOL ;  Surgeon: Janalyn Keene NOVAK, MD;  Location: [facility] ENDOSCOPY;  Service: Endoscopy;  Laterality: N/A;   CORONARY ANGIOPLASTY WITH STENT PLACEMENT Left 11/26/2016   Procedure: CORONARY ANGIOPLASTY WITH STENT PLACEMENT; Location: [facility]; Surgeon: Alm Dais, MD   CORONARY ARTERY BYPASS GRAFT N/A 08/30/2016   Procedure: CORONARY ARTERY BYPASS GRAFT; Location: [facility]; Surgeon: Juliene Pouch, MD   CORONARY STENT  INTERVENTION N/A 06/09/2020   Procedure: CORONARY STENT INTERVENTION;  Surgeon: Lovelace Cara JONETTA, MD;  Location: [facility];  Service: Cardiovascular;  Laterality: N/A;   CORONARY STENT INTERVENTION Left 12/17/2016   Procedure: CORONARY STENT INTERVENTION; Location: [facility]; Surgeon: Alm Dais, MD   CORONARY STENT INTERVENTION  03/2024   ENDOSCOPIC MUCOSAL RESECTION N/A 09/22/2020   Procedure: ENDOSCOPIC MUCOSAL RESECTION;  Surgeon: [name] Aloha Raddle., MD;  Location: [facility] ENDOSCOPY;  Service: Gastroenterology;  Laterality: N/A;   ESOPHAGOGASTRODUODENOSCOPY N/A 09/07/2020   Procedure: ESOPHAGOGASTRODUODENOSCOPY (EGD);  Surgeon: Janalyn Keene NOVAK, MD;  Location: [facility] ENDOSCOPY;  Service: Endoscopy;  Laterality: N/A;   ESOPHAGOGASTRODUODENOSCOPY  N/A 10/19/2023   Procedure: EGD (ESOPHAGOGASTRODUODENOSCOPY);  Surgeon: Jinny Carmine, MD;  Location: [facility] ENDOSCOPY;  Service: Endoscopy;  Laterality: N/A;   ESOPHAGOGASTRODUODENOSCOPY (EGD) WITH PROPOFOL  N/A 09/22/2020   Procedure: ESOPHAGOGASTRODUODENOSCOPY (EGD) WITH PROPOFOL ;  Surgeon: [name] Aloha Raddle., MD;  Location: [facility] ENDOSCOPY;  Service: Gastroenterology;  Laterality: N/A;   ESOPHAGOGASTRODUODENOSCOPY (EGD) WITH PROPOFOL  N/A 08/26/2023   Procedure: ESOPHAGOGASTRODUODENOSCOPY (EGD) WITH PROPOFOL ;  Surgeon: Jinny Carmine, MD;  Location: [facility] ENDOSCOPY;  Service: Endoscopy;  Laterality: N/A;   FRACTURE SURGERY     HEMOSTASIS CLIP PLACEMENT  09/22/2020   Procedure: HEMOSTASIS CLIP PLACEMENT;  Surgeon: [name] Aloha Raddle., MD;  Location: [facility] ENDOSCOPY;  Service: Gastroenterology;;   HEMOSTASIS CONTROL  09/22/2020   Procedure: HEMOSTASIS CONTROL;  Surgeon: [name] Aloha Raddle., MD;  Location: [facility] ENDOSCOPY;  Service: Gastroenterology;;   INSERTION OF MESH  02/19/2023   Procedure: INSERTION OF MESH;  Surgeon: Desiderio Schanz, MD;  Location: [facility] ORS;  Service: General;;  umbilical   LAPAROSCOPIC PARTIAL RIGHT COLECTOMY Right  08/12/2012   Procedure: LAPAROSCOPIC PARTIAL RIGHT COLECTOMY; Location: [facility]; Surgeon: Unknown Sharps, MD   LEFT HEART CATH AND CORONARY ANGIOGRAPHY Left 06/09/2020   Procedure: LEFT HEART CATH AND CORONARY ANGIOGRAPHY;  Surgeon: Florencio Cara BIRCH, MD;  Location: [facility];  Service: Cardiovascular;  Laterality: Left;   LEFT HEART CATH AND CORS/GRAFTS ANGIOGRAPHY Left 11/20/2016   Procedure: Left Heart Cath and Cors/Grafts Angiography;  Surgeon: Cara BIRCH Florencio, MD;  Location: [facility];  Service: Cardiovascular;  Laterality: Left;   POLYPECTOMY  09/22/2020   Procedure: POLYPECTOMY;  Surgeon: [name] Aloha Raddle., MD;  Location: [facility] ENDOSCOPY;  Service: Gastroenterology;;   POLYPECTOMY  10/19/2023   Procedure: POLYPECTOMY;  Surgeon: Jinny Carmine, MD;  Location: [facility] ENDOSCOPY;  Service: Endoscopy;;   RIGHT HEART CATH Right 07/01/2024   Procedure: RIGHT HEART CATH;  Surgeon: Ammon Blunt, MD;  Location: [facility];  Service: Cardiovascular;  Laterality: Right;   SUBMUCOSAL LIFTING INJECTION  09/22/2020   Procedure: SUBMUCOSAL LIFTING INJECTION;  Surgeon: [name] Aloha Raddle., MD;  Location: [facility] ENDOSCOPY;  Service: Gastroenterology;;   UMBILICAL HERNIA REPAIR N/A 02/19/2023   Procedure: HERNIA REPAIR UMBILICAL ADULT, open;  Surgeon: Desiderio Schanz, MD;  Location: [facility] ORS;  Service: General;  Laterality: N/A;   XI ROBOTIC ASSISTED INGUINAL HERNIA REPAIR WITH MESH Right 02/19/2023   Procedure: XI ROBOTIC ASSISTED INGUINAL HERNIA REPAIR WITH MESH;  Surgeon: Desiderio Schanz, MD;  Location: [facility] OR[facility];  Service: General;  Laterality: Right;   Social History   Socioeconomic History   Marital status: Married    Spouse name: Not on file   Number of children: Not on file   Years of education: Not on file   Highest education level: Not on file  Occupational History   Not on file  Tobacco Use   Smoking status: Former    Current packs/day: 0.00    Types:  Cigarettes    Quit date: 2000    Years since quitting: 26.0    Passive exposure: Never   Smokeless tobacco: Never  Vaping Use   Vaping status: Never Used  Substance and Sexual Activity   Alcohol use: No   Drug use: No   Sexual activity: Not Currently  Other Topics Concern   Not on file  Social History Narrative   Not on file   Social Drivers of Health   Tobacco Use: Medium Risk (08/10/2024)   Patient History    Smoking Tobacco Use: Former  Smokeless Tobacco Use: Never    Passive Exposure: Never  Financial Resource Strain: Low Risk  (05/08/2024)   Received from [facility]   Overall Financial Resource Strain (CARDIA)    Difficulty of Paying Living Expenses: Not hard at all  Food Insecurity: No Food Insecurity (08/11/2024)   Epic    Worried About Programme Researcher, Broadcasting/film/video in the Last Year: Never true    Ran Out of Food in the Last Year: Never true  Transportation Needs: No Transportation Needs (08/11/2024)   Epic    Lack of Transportation (Medical): No    Lack of Transportation (Non-Medical): No  Physical Activity: Not on file  Stress: Not on file  Social Connections: Unknown (08/11/2024)   Social Connection and Isolation Panel    Frequency of Communication with Friends and Family: More than three times a week    Frequency of Social Gatherings with Friends and Family: More than three times a week    Attends Religious Services: Not on Marketing Executive or Organizations: Not on file    Attends Banker Meetings: Not on file    Marital Status: Not on file  Recent Concern: Social Connections - Moderately Isolated (07/18/2024)   Social Connection and Isolation Panel    Frequency of Communication with Friends and Family: More than three times a week    Frequency of Social Gatherings with Friends and Family: More than three times a week    Attends Religious Services: Never    Database Administrator or Organizations: No    Attends Tax Inspector Meetings: Never    Marital Status: Married  Depression (PHQ2-9): Low Risk (07/16/2024)   Depression (PHQ2-9)    PHQ-2 Score: 0  Alcohol Screen: Not on file  Housing: Unknown (08/11/2024)   Epic    Unable to Pay for Housing in the Last Year: No    Number of Times Moved in the Last Year: 0    Homeless in the Last Year: Not on file  Utilities: Not At Risk (08/11/2024)   Epic    Threatened with loss of utilities: No  Health Literacy: Not on file   Family History  Problem Relation Age of Onset   Hyperlipidemia Mother    Heart disease Mother    Hypertension Father    Allergies[1] Prior to Admission medications  Medication Sig Start Date End Date Taking? Authorizing Provider  amLODipine  (NORVASC ) 10 MG tablet Take 10 mg by mouth daily. 03/23/24 03/23/25  [provider]  aspirin  EC 81 MG tablet Take 81 mg by mouth daily. 04/02/24 04/02/25  [provider]  atorvastatin  (LIPITOR ) 80 MG tablet Take 80 mg by mouth.  Take 80 mg by mouth in the morning. 12/21/23 12/20/24  [provider]  bisacodyl  (DULCOLAX) 5 MG EC tablet Take 5 mg by mouth as needed for moderate constipation.    [provider]  carvedilol  (COREG ) 3.125 MG tablet Take 1 tablet (3.125 mg total) by mouth 2 (two) times daily with a meal. 03/04/24   Caleen Qualia, MD  cyanocobalamin  (VITAMIN B12) 1000 MCG tablet Take 1,000 mcg by mouth daily.    [provider]  dexlansoprazole (DEXILANT) 60 MG capsule Take 1 capsule by mouth daily. 12/25/21   [provider]  empagliflozin  (JARDIANCE ) 10 MG TABS tablet Take 1 tablet (10 mg total) by mouth daily before breakfast. 08/29/23   Agbata, Tochukwu, MD  Ferrous Sulfate  (IRON ) 325 (65 Fe) MG  TABS Take 1 tablet by mouth daily.    [provider]  isosorbide  mononitrate (IMDUR ) 120 MG 24 hr tablet Take 120 mg by mouth daily. 03/23/24 03/23/25  [provider]  lactulose  (CHRONULAC ) 10 GM/15ML solution Taking 15-20 cc  by mouth 3 to 4 times a day for constiption Patient taking differently: Taking 15-20 cc by mouth 3 to 4 times a day for constiption as needed 10/08/23   Therisa Bi, MD  nitroGLYCERIN  (NITROSTAT ) 0.4 MG SL tablet Place 0.4 mg under the tongue every 5 (five) minutes as needed for chest pain.    [provider]  torsemide  (DEMADEX ) 20 MG tablet Take 3 tablets (60 mg total) by mouth daily. 08/15/24   Jens Durand, MD   Recent Labs    08/18/24 1641 08/19/24 0425  WBC 16.9* 14.8*  HGB 8.6* 8.5*  HCT 27.4* 27.1*  PLT 86* 71*  K 4.9 4.8  CL 94* 94*  CO2 30 32  BUN 69* 76*  CREATININE 2.53* 2.66*  GLUCOSE 203* 211*  CALCIUM  11.2* 10.4*   ECHOCARDIOGRAM COMPLETE Result Date: 08/19/2024    ECHOCARDIOGRAM REPORT   Patient Name:   TAOS TAPP [facility] Date of Exam: 08/18/2024 Medical Rec #:  [id number]        Height:       73.0 in Accession #:    [id number]       Weight:       190.0 lb Date of Birth:  27-Jul-1946         BSA:          2.105 m Patient Age:    78 years         BP:           124/76 mmHg Patient Gender: M                HR:           115 bpm. Exam Location:  [facility] Procedure: 2D Echo, Cardiac Doppler and Color Doppler (Both Spectral and Color            Flow Doppler were utilized during procedure). Indications:     I50.21 Acute Systolic CHF  History:         Patient has prior history of Echocardiogram examinations, most                  recent 03/03/2024. CHF, CAD, Prior CABG and TAVR-01/23/24,                  Arrythmias:LBBB, RBBB and Atrial Fibrillation,                  Signs/Symptoms:Murmur and Dyspnea; Risk Factors:Hypertension,                  Diabetes and Dyslipidemia.  Sonographer:     Carl Coma RDCS Referring Phys:  [id number] MORENE BATHE Diagnosing Phys: Dwayne D Callwood MD IMPRESSIONS  1. Left ventricular ejection fraction, by estimation, is 60 to 65%. The left ventricle has normal function. The left ventricle has no regional wall motion abnormalities. There is severe  concentric left ventricular hypertrophy. Left ventricular diastolic  parameters are consistent with Grade III diastolic dysfunction (restrictive). There is the interventricular septum is flattened in diastole ('D' shaped left ventricle), consistent with right ventricular volume overload.  2. Right ventricular systolic function is moderately reduced. The right ventricular size is moderately enlarged. Mildly increased right ventricular wall thickness.  3. Left atrial size was severely  dilated.  4. Right atrial size was moderately dilated.  5. The mitral valve is grossly normal. Moderate mitral valve regurgitation. Moderate mitral annular calcification.  6. Tricuspid valve regurgitation is moderate to severe.  7. The aortic valve is grossly normal. Aortic valve regurgitation is mild. Aortic valve sclerosis/calcification is present, without any evidence of aortic stenosis. 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 FINDINGS  Left Ventricle: Left ventricular ejection fraction, by estimation, is 60 to 65%. The left ventricle has normal function. The left ventricle has no regional wall motion abnormalities. Strain was performed and the global longitudinal strain is indeterminate. The left ventricular internal cavity size was normal in size. There is severe concentric left ventricular hypertrophy. The interventricular septum is flattened in diastole ('D' shaped left ventricle), consistent with right ventricular volume overload. Left ventricular diastolic parameters are consistent with Grade III diastolic dysfunction (restrictive). Right Ventricle: The right ventricular size is moderately enlarged. Mildly increased right ventricular wall thickness. Right ventricular systolic function is moderately reduced. Left Atrium: Left atrial size was severely dilated. Right Atrium: Right atrial size was moderately dilated. Pericardium: There is no evidence of pericardial effusion.  Mitral Valve: The mitral valve is grossly normal. There is moderate thickening of the mitral valve leaflet(s). There is moderate calcification of the mitral valve leaflet(s). Moderate mitral annular calcification. Moderate mitral valve regurgitation. Tricuspid Valve: The tricuspid valve is grossly normal. Tricuspid valve regurgitation is moderate to severe. The aortic valve is grossly normal. Aortic valve regurgitation is mild. Aortic valve sclerosis/calcification is present, without any evidence of aortic stenosis. 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 Pulmonic Valve: The pulmonic valve was normal in structure. Pulmonic valve regurgitation is not visualized. Aorta: The aortic root was not well visualized. IAS/Shunts: No atrial level shunt detected by color flow Doppler. Additional Comments: 3D was performed not requiring image post processing on an independent workstation and was indeterminate.  LEFT VENTRICLE PLAX 2D LVIDd:         4.50 cm LVIDs:         3.20 cm LV PW:         1.80 cm LV IVS:        1.80 cm  RIGHT VENTRICLE            IVC RV Basal diam:  4.90 cm    IVC diam: 2.10 cm RV S prime:     6.74 cm/s TAPSE (M-mode): 1.2 cm LEFT ATRIUM             Index        RIGHT ATRIUM           Index LA diam:        5.30 cm 2.52 cm/m   RA Area:     28.30 cm LA Vol (A2C):   94.4 ml 44.84 ml/m  RA Volume:   102.00 ml 48.45 ml/m LA Vol (A4C):   92.0 ml 43.70 ml/m LA Biplane Vol: 97.0 ml 46.08 ml/m  AORTIC VALVE AV Vmax:           302.00 cm/s AV Vmean:          212.200 cm/s AV VTI:            0.447 m AV Peak Grad:      36.5 mmHg AV Mean Grad:      20.6 mmHg LVOT Vmax:  81.03 cm/s LVOT Vmean:        52.067 cm/s LVOT VTI:          0.135 m LVOT/AV VTI ratio: 0.30 AI PHT:            277 msec  AORTA Ao Root diam: 3.40 cm Ao Asc diam:  3.90 cm MR Peak grad: 112.8 mmHg    TRICUSPID VALVE MR Vmax:       531.00 cm/s   TR Peak grad:   58.7 mmHg MV E velocity: 163.25 cm/s  TR Vmax:        383.00 cm/s                              SHUNTS                             Systemic VTI: 0.14 m Cara JONETTA Lovelace MD Electronically signed by Cara JONETTA Lovelace MD Signature Date/Time: 08/19/2024/12:52:25 PM    Final    DG Hand Complete Left Result Date: 08/19/2024 EXAM: 3 OR MORE VIEW(S) XRAY OF THE LEFT HAND 08/19/2024 12:49:00 AM COMPARISON: 08/18/2024. CLINICAL HISTORY: Swelling. FINDINGS: BONES AND JOINTS: No acute fracture, subluxation or dislocation. No malalignment. Calcifications about the left wrist, likely synovial calcifications. Arthritic changes throughout the left wrist. SOFT TISSUES: Small radiopaque foreign body noted in the soft tissues of the left middle finger anterior to the middle phalanx. IMPRESSION: 1. Small radiopaque foreign body in the soft tissues of the left middle finger anterior to the middle phalanx. 2. No acute fracture, subluxation, or dislocation. Electronically signed by: Franky Crease MD 08/19/2024 01:00 AM EST RP Workstation: [id number]   US  Venous Img Upper Uni Left (DVT) Result Date: 08/19/2024 CLINICAL DATA:  Swelling EXAM: Left UPPER EXTREMITY VENOUS DOPPLER ULTRASOUND TECHNIQUE: Gray-scale sonography with graded compression, as well as color Doppler and duplex ultrasound were performed to evaluate the upper extremity deep venous system from the level of the subclavian vein and including the jugular, axillary, basilic, radial, ulnar and upper cephalic vein. Spectral Doppler was utilized to evaluate flow at rest and with distal augmentation maneuvers. COMPARISON:  None Available. FINDINGS: Contralateral Subclavian Vein: Respiratory phasicity is normal and symmetric with the symptomatic side. No evidence of thrombus. Internal Jugular Vein: No evidence of thrombus. Normal compressibility, respiratory phasicity and response to augmentation. Subclavian Vein: No evidence of thrombus. Normal  respiratory phasicity and response to augmentation. Axillary Vein: No evidence of thrombus. Normal compressibility, respiratory phasicity and response to augmentation. Cephalic Vein: No evidence of thrombus. Normal compressibility, respiratory phasicity and response to augmentation. Basilic Vein: No evidence of thrombus. Normal compressibility, respiratory phasicity and response to augmentation. Brachial Veins: No evidence of thrombus. Normal compressibility, respiratory phasicity and response to augmentation. Radial Veins: No evidence of thrombus. Normal compressibility, respiratory phasicity and response to augmentation. Ulnar Veins: No evidence of thrombus. Normal compressibility, respiratory phasicity and response to augmentation. IMPRESSION: No evidence of DVT within the left upper extremity. Electronically Signed   By: Luke Bun M.D.   On: 08/19/2024 00:31   DG Shoulder Left Result Date: 08/18/2024 EXAM: 1 VIEW(S) XRAY OF THE LEFT SHOULDER 08/18/2024 07:58:00 PM COMPARISON: None available. CLINICAL HISTORY: Shoulder pain, left. FINDINGS: BONES AND JOINTS: Glenohumeral joint is normally aligned. No acute fracture. No malalignment. 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 SOFT TISSUES: No abnormal calcifications. Visualized lung is unremarkable. IMPRESSION: 1. 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 Electronically signed by: Elsie Gravely MD 08/18/2024 08:12 PM EST RP Workstation: [id number]   DG Hand 2 View Left Result Date: 08/18/2024 EXAM: 1 or 2 VIEW(S) XRAY OF THE LEFT HAND 08/18/2024 07:58:00 PM COMPARISON: None available. CLINICAL HISTORY: Pain FINDINGS: BONES AND JOINTS: Prominent degenerative changes in the interphalangeal joints, first carpometacarpal and metacarpophalangeal joints, radiocarpal, radioulnar, and intercarpal joints. Old healed fracture deformity of the distal radius. Subcortical cyst in the base of the 1st metacarpal,  likely degenerative cyst. SOFT TISSUES: Soft tissue calcifications in and around the wrist likely representing synovial calcifications. Dorsal soft tissue swelling. Tiny metallic foreign body suggested in the soft tissues of the third finger adjacent to the middle phalanx. IMPRESSION: 1. Dorsal soft tissue swelling. 2. Tiny metallic foreign body suggested in the soft tissues of the third finger adjacent to the middle phalanx. 3. Prominent degenerative changes in the interphalangeal joints, first carpometacarpal and metacarpophalangeal joints, radiocarpal, radioulnar, and intercarpal joints. 4. Soft tissue calcifications in and around the wrist, likely representing synovial calcifications. 5. Old healed fracture deformity of the distal radius. Electronically signed by: Elsie Gravely MD 08/18/2024 08:11 PM EST RP Workstation: [id number]   DG Chest Portable 1 View Result Date: 08/18/2024 EXAM: 1 VIEW(S) XRAY OF THE CHEST 08/18/2024 04:53:00 PM COMPARISON: 08/10/2024 CLINICAL HISTORY: SOB FINDINGS: LUNGS AND PLEURA: Moderate right pleural effusion, stable compared to prior. Small left pleural effusion. Mild diffuse interstitial prominence. Bibasilar airspace opacities, likely compressive atelectasis. No pneumothorax. HEART AND MEDIASTINUM: Enlarged cardiomediastinal silhouette, unchanged. Median sternotomy and TAVR noted. Atherosclerotic calcifications. BONES AND SOFT TISSUES: No acute osseous abnormality. IMPRESSION: 1. Moderate right pleural effusion and small left pleural effusion. 2. Mild diffuse interstitial prominence and bibasilar airspace opacities, likely compressive atelectasis. Electronically signed by: Greig Pique MD 08/18/2024 05:32 PM EST RP Workstation: [id number]     Positive ROS: All other systems have been reviewed and were otherwise negative with the exception of those mentioned in the HPI and as above.  Physical Exam: BP (!) 110/52 (BP Location: Left Arm)   Pulse 71   Temp (!) 101.8 F  (38.8 C) (Oral)   Resp (!) 23   Ht 6' 1 (1.854 m)   Wt 86.2 kg   SpO2 100%   BMI 25.07 kg/m  General:  Alert, no acute distress Psychiatric:  Patient is competent for consent with normal mood and affect     Orthopedic Exam:  RUE: +ain/pin/u motor SILT r/u/m/ax +rad pulse RoM shoulder: Able to passively FF to 130, passively ER to 50 with minimal pain.  Able to flex/ext thumb at IP and MCP joints with mild pain, patient states this is somewhat improved  LUE: +ain/pin/u motor SILT r/u/m/ax +rad pulse RoM shoulder: Able to passively FF to 130, passively ER to 50 with minimal pain.   Imaging:  As above: L shoulder radiographs suggestive of rotator cuff arthropathy. No fx/dislocation  Assessment/Plan: 79 year old male with multiple medical problems presenting with MSSA bacteremia as well as bilateral shoulder pain and left thumb pain. 1.  Regarding the left thumb/forearm, patient states that previously noted erythema and swelling has improved significantly.  He is also having decreased pain with thumb range of motion today compared to at the time of admission.  Given these findings, we discussed that septic arthritis was less likely.  He will be continued on IV antibiotics for MSSA bacteremia.  We can plan to continue to follow his exam.  2.  Regarding bilateral shoulders, he has  relatively good passive range of motion with minimal pain.  He has increased pain with active range of motion.  Left shoulder radiographs suggest rotator cuff arthropathy with elevation of the humeral head, which would be consistent with these findings.  Right shoulder radiographs have not been obtained.  Septic arthritis of either shoulder joint is unlikely.  3.  Recommend continued treatment of MSSA bacteremia.  Patient may take up to 1000 mg Tylenol  3 times daily.  NSAIDs would likely be contraindicated given patient's CKD.  Corticosteroid injections/steroids would likely also be contraindicated given  bacteremia.    Earnestine Blanch   08/19/2024 5:49 PM     [1] No Known Allergies  "

## 2024-08-19 NOTE — Consult Note (Signed)
 NAME: [name]  DOB: 1945-09-10  MRN: [id number]  Date/Time: 08/19/2024 12:28 PM  REQUESTING PROVIDER: [employer] Subjective:  REASON FOR CONSULT: MSSA bacteremia ? [name] is a 79 y.o. with a history of TAVR, CAD s/p CABG, DM, OSA, hypothyroidism, PAF, CHF, severe Pulmonary HTN, mitral regurgitation recurrent Pleural effusion needing thoracentesis, CKD, anemia with BT was recently at [facility] 12/29-1/3 for SOB  presented to the ED on 08/18/24 with weakness, Edema legs and sliding out of bed Pt also having severe pain rt shoulder and left thumb sicne Sunday Pt lives with wife and is coming from home  Vitals in the ED   08/18/24 16:35  BP 132/58 !  Temp 98.6 F (37 C)  Pulse Rate 91  SpO2 94 %    Latest Reference Range & Units 08/18/24 16:41  WBC 4.0 - 10.5 K/uL 16.9 (H)  Hemoglobin 13.0 - 17.0 g/dL 8.6 (L)  HCT 60.9 - 47.9 % 27.4 (L)  Platelets 150 - 400 K/uL 86 (L) [1]  Creatinine 0.61 - 1.24 mg/dL 7.46 (H)   CXR showed b/l pleural effusion and CHF Rt > left I am seeing the patient as he has MSSA  bacteremia HE is on cefazolin  Son at bed side   Past Medical History:  Diagnosis Date   (HFpEF) heart failure with preserved ejection fraction (HCC) 07/25/2016   a.)TTE 07/25/16: EF >55, triv-mild pan regur, mild AS, G2DD; b.)TTE 09/02/16: EF >55, triv TR, mild AS; c.)TTE 11/05/16: EF >55, LAE, triv-mild pan regur, mild AS, G2DD; d.)TTE 01/21/20: EF >55, mod LVH, LAE/RVE, triv-mild AR/PR/TR, mod MR, mild AS, G2DD; e.)TTE 09/24/22: EF>55, sev LVH, sev LAE, RAE, mild PR, mod AR/MR/TR, mild-mod AS, RVSP 74.7; f.)TTE 12/23/22: EF 60-65, LAE, mild-mod MR/TR, mild AS   Abnormal urine 05/01/2023   Anemia    Aortic atherosclerosis    Aortic stenosis 07/25/2016   a.) TTE 07/25/2016: mild (MPG 13.5); b.) TTE 09/02/2016: mild (MPG 16); c.) TTE 11/05/2016: mild (MPG 9); d.) TTE 01/21/2020: mild (MPG 11.7); e.) TTE 09/24/2022: mild-mod (MPG 20.3); f.) TTE 12/23/2022: mild (MPG 15)   Atrial  fibrillation (HCC)    a.) CHA2DS2VASc = 6 (age x2, CHF, HTN, vascular disease history, T2DM);  b.) rate/rhythm maintained on oral labetalol ; chronically anticoagulated with apixaban  + clopidogrel    AV block, 1st degree    B12 deficiency    CKD (chronic kidney disease), stage III (HCC)    Colon polyps    Congestive heart failure (HCC) 05/01/2023   Coronary artery disease 08/16/2016   a.) s/p 3v CABG 08/30/2016; b.) s/p PCI 11/26/2016 (DES x 1 oSVG-RCA); c.) s/p PCI 12/17/2016 (DES x 5 --> mLCx x2, dLCx, pLAD, dLAD); c.) s/p PCI 06/09/2020 (DES x1 --> ISR oSVG-PDA)   Cyst of kidney, acquired 07/03/2023   Dyspnea    Gastroesophageal reflux disease 05/01/2023   GERD (gastroesophageal reflux disease)    Heart murmur    Hepatic steatosis    History of 2019 novel coronavirus disease (COVID-19) 09/02/2020   a.) Tx'd with remdesivir    History of blood transfusion    History of GI bleed 09/02/2020   a.) admitted to [facility] 09/02/2020 - 09/08/2020   History of heart artery stent 11/26/2016   TOTAL of 7 stents: a.) 11/26/2016 --> 3.5 x 22 mm Resolute Integrity oSVG-RCA; b.) 12/17/2016: 3.0 x 12 mm Xience Alpine dLCx, 3.5 x 22 mm Resolute Onyx mLCx, 3.5 x 18 mm Resolute Onyx mLCx, 3.0 x 26 mm Resolute Onyx pLAD, 2.0 x  26 mm Resolute Onyx dLAD; c.) 06/09/2020 --> 2.5 x 18 mm Resolute Onyx ISR oSVG-PDA   History of partial colectomy 08/12/2012   a.) large gastric hyperplastic polyp removal   HLD (hyperlipidemia)    Hypertension    LBBB (left bundle branch block)    Long term current use of anticoagulant    a.) apixaban    Long term current use of antithrombotics/antiplatelets    a.) clopidogrel    Male hypogonadism    a.) on exogenous TRT (1.62% gel)   Mild cardiomegaly    Mild gynecomastia (bilateral)    Nephrolithiasis    OSA (obstructive sleep apnea)    a.) does not utilize nocturnal PAP therapy   Personal history of surgery to heart and great vessels, presenting hazards to health 05/01/2023    Proteinuria, unspecified 08/01/2023   Pulmonary hypertension (HCC) 09/24/2022   a.) TTE 09/24/2022: RVSP 74.7; b.) TTE 12/23/2022: RVSP 31.3   RBBB (right bundle branch block with left anterior fascicular block)    Right inguinal hernia    Right thyroid  nodule    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   a.) LIMA-LAD, SVG-PDA, SVG-OM3   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/01/2023   Type 2 diabetes mellitus treated with insulin  (HCC)    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   Umbilical hernia    Unstable angina (HCC)    Vitamin D  deficiency     Past Surgical History:  Procedure Laterality Date   ANOMALOUS PULMONARY VENOUS RETURN REPAIR, TOTAL  2025   APPENDECTOMY     CARDIAC CATHETERIZATION Left 08/16/2016   Procedure: Left Heart Cath and Coronary Angiography;  Surgeon: Cara JONETTA Lovelace, MD;  Location: [facility];  Service: Cardiovascular;  Laterality: Left;   COLONOSCOPY N/A 09/07/2020   Procedure: COLONOSCOPY;  Surgeon: Janalyn Keene NOVAK, MD;  Location: [facility] ENDOSCOPY;  Service: Endoscopy;  Laterality: N/A;   COLONOSCOPY N/A 10/19/2023   Procedure: COLONOSCOPY;  Surgeon: Jinny Carmine, MD;  Location: [facility] ENDOSCOPY;  Service: Endoscopy;  Laterality: N/A;   COLONOSCOPY WITH PROPOFOL  N/A 05/17/2021   Procedure: COLONOSCOPY WITH PROPOFOL ;  Surgeon: Janalyn Keene NOVAK, MD;  Location: [facility] ENDOSCOPY;  Service: Endoscopy;  Laterality: N/A;   CORONARY ANGIOPLASTY WITH STENT PLACEMENT Left 11/26/2016   Procedure: CORONARY ANGIOPLASTY WITH STENT PLACEMENT; Location: [facility]; Surgeon: Alm Dais, MD   CORONARY ARTERY BYPASS GRAFT N/A 08/30/2016   Procedure: CORONARY ARTERY BYPASS GRAFT; Location: [facility]; Surgeon: Juliene Pouch, MD   CORONARY STENT INTERVENTION N/A 06/09/2020   Procedure: CORONARY STENT INTERVENTION;  Surgeon: Lovelace Cara JONETTA, MD;  Location: [facility];  Service: Cardiovascular;  Laterality: N/A;   CORONARY STENT  INTERVENTION Left 12/17/2016   Procedure: CORONARY STENT INTERVENTION; Location: [facility]; Surgeon: Alm Dais, MD   CORONARY STENT INTERVENTION  03/2024   ENDOSCOPIC MUCOSAL RESECTION N/A 09/22/2020   Procedure: ENDOSCOPIC MUCOSAL RESECTION;  Surgeon: [name] Aloha Raddle., MD;  Location: [facility] ENDOSCOPY;  Service: Gastroenterology;  Laterality: N/A;   ESOPHAGOGASTRODUODENOSCOPY N/A 09/07/2020   Procedure: ESOPHAGOGASTRODUODENOSCOPY (EGD);  Surgeon: Janalyn Keene NOVAK, MD;  Location: [facility] ENDOSCOPY;  Service: Endoscopy;  Laterality: N/A;   ESOPHAGOGASTRODUODENOSCOPY N/A 10/19/2023   Procedure: EGD (ESOPHAGOGASTRODUODENOSCOPY);  Surgeon: Jinny Carmine, MD;  Location: [facility] ENDOSCOPY;  Service: Endoscopy;  Laterality: N/A;   ESOPHAGOGASTRODUODENOSCOPY (EGD) WITH PROPOFOL  N/A 09/22/2020   Procedure: ESOPHAGOGASTRODUODENOSCOPY (EGD) WITH PROPOFOL ;  Surgeon: [name] Aloha Raddle., MD;  Location: [facility] ENDOSCOPY;  Service: Gastroenterology;  Laterality: N/A;   ESOPHAGOGASTRODUODENOSCOPY (EGD) WITH  PROPOFOL  N/A 08/26/2023   Procedure: ESOPHAGOGASTRODUODENOSCOPY (EGD) WITH PROPOFOL ;  Surgeon: Jinny Carmine, MD;  Location: [facility] ENDOSCOPY;  Service: Endoscopy;  Laterality: N/A;   FRACTURE SURGERY     HEMOSTASIS CLIP PLACEMENT  09/22/2020   Procedure: HEMOSTASIS CLIP PLACEMENT;  Surgeon: [name] Aloha Raddle., MD;  Location: [facility] ENDOSCOPY;  Service: Gastroenterology;;   HEMOSTASIS CONTROL  09/22/2020   Procedure: HEMOSTASIS CONTROL;  Surgeon: [name] Aloha Raddle., MD;  Location: [facility] ENDOSCOPY;  Service: Gastroenterology;;   INSERTION OF MESH  02/19/2023   Procedure: INSERTION OF MESH;  Surgeon: Desiderio Schanz, MD;  Location: [facility] ORS;  Service: General;;  umbilical   LAPAROSCOPIC PARTIAL RIGHT COLECTOMY Right 08/12/2012   Procedure: LAPAROSCOPIC PARTIAL RIGHT COLECTOMY; Location: [facility]; Surgeon: Unknown Sharps, MD   LEFT HEART CATH AND CORONARY ANGIOGRAPHY Left 06/09/2020   Procedure: LEFT HEART CATH AND  CORONARY ANGIOGRAPHY;  Surgeon: Florencio Cara BIRCH, MD;  Location: [facility];  Service: Cardiovascular;  Laterality: Left;   LEFT HEART CATH AND CORS/GRAFTS ANGIOGRAPHY Left 11/20/2016   Procedure: Left Heart Cath and Cors/Grafts Angiography;  Surgeon: Cara BIRCH Florencio, MD;  Location: [facility];  Service: Cardiovascular;  Laterality: Left;   POLYPECTOMY  09/22/2020   Procedure: POLYPECTOMY;  Surgeon: [name] Aloha Raddle., MD;  Location: [facility] ENDOSCOPY;  Service: Gastroenterology;;   POLYPECTOMY  10/19/2023   Procedure: POLYPECTOMY;  Surgeon: Jinny Carmine, MD;  Location: [facility] ENDOSCOPY;  Service: Endoscopy;;   RIGHT HEART CATH Right 07/01/2024   Procedure: RIGHT HEART CATH;  Surgeon: Ammon Blunt, MD;  Location: [facility];  Service: Cardiovascular;  Laterality: Right;   SUBMUCOSAL LIFTING INJECTION  09/22/2020   Procedure: SUBMUCOSAL LIFTING INJECTION;  Surgeon: [name] Aloha Raddle., MD;  Location: [facility] ENDOSCOPY;  Service: Gastroenterology;;   UMBILICAL HERNIA REPAIR N/A 02/19/2023   Procedure: HERNIA REPAIR UMBILICAL ADULT, open;  Surgeon: Desiderio Schanz, MD;  Location: [facility] ORS;  Service: General;  Laterality: N/A;   XI ROBOTIC ASSISTED INGUINAL HERNIA REPAIR WITH MESH Right 02/19/2023   Procedure: XI ROBOTIC ASSISTED INGUINAL HERNIA REPAIR WITH MESH;  Surgeon: Desiderio Schanz, MD;  Location: [facility];  Service: General;  Laterality: Right;    Social History   Socioeconomic History   Marital status: Married    Spouse name: Not on file   Number of children: Not on file   Years of education: Not on file   Highest education level: Not on file  Occupational History   Not on file  Tobacco Use   Smoking status: Former    Current packs/day: 0.00    Types: Cigarettes    Quit date: 2000    Years since quitting: 26.0    Passive exposure: Never   Smokeless tobacco: Never  Vaping Use   Vaping status: Never Used  Substance and Sexual Activity    Alcohol use: No   Drug use: No   Sexual activity: Not Currently  Other Topics Concern   Not on file  Social History Narrative   Not on file   Social Drivers of Health   Tobacco Use: Medium Risk (08/10/2024)   Patient History    Smoking Tobacco Use: Former    Smokeless Tobacco Use: Never    Passive Exposure: Never  Physicist, Medical Strain: Low Risk  (05/08/2024)   Received from [facility]   Overall Financial Resource Strain (CARDIA)    Difficulty of Paying Living Expenses: Not hard at all  Food Insecurity: No Food Insecurity (08/11/2024)   Epic  Worried About Programme Researcher, Broadcasting/film/video in the Last Year: Never true    Ran Out of Food in the Last Year: Never true  Transportation Needs: No Transportation Needs (08/11/2024)   Epic    Lack of Transportation (Medical): No    Lack of Transportation (Non-Medical): No  Physical Activity: Not on file  Stress: Not on file  Social Connections: Unknown (08/11/2024)   Social Connection and Isolation Panel    Frequency of Communication with Friends and Family: More than three times a week    Frequency of Social Gatherings with Friends and Family: More than three times a week    Attends Religious Services: Not on Marketing Executive or Organizations: Not on file    Attends Banker Meetings: Not on file    Marital Status: Not on file  Recent Concern: Social Connections - Moderately Isolated (07/18/2024)   Social Connection and Isolation Panel    Frequency of Communication with Friends and Family: More than three times a week    Frequency of Social Gatherings with Friends and Family: More than three times a week    Attends Religious Services: Never    Database Administrator or Organizations: No    Attends Banker Meetings: Never    Marital Status: Married  Catering Manager Violence: Unknown (08/11/2024)   Epic    Fear of Current or Ex-Partner: No    Emotionally Abused: No    Physically  Abused: Not on file    Sexually Abused: Not on file  Depression (PHQ2-9): Low Risk (07/16/2024)   Depression (PHQ2-9)    PHQ-2 Score: 0  Alcohol Screen: Not on file  Housing: Unknown (08/11/2024)   Epic    Unable to Pay for Housing in the Last Year: No    Number of Times Moved in the Last Year: 0    Homeless in the Last Year: Not on file  Utilities: Not At Risk (08/11/2024)   Epic    Threatened with loss of utilities: No  Health Literacy: Not on file    Family History  Problem Relation Age of Onset   Hyperlipidemia Mother    Heart disease Mother    Hypertension Father    Allergies[1] I? Current Facility-Administered Medications  Medication Dose Route Frequency Provider Last Rate Last Admin   acetaminophen  (TYLENOL ) tablet 650 mg  650 mg Oral Q6H PRN Fernand Prost, MD       Or   acetaminophen  (TYLENOL ) suppository 650 mg  650 mg Rectal Q6H PRN Fernand Prost, MD       ceFAZolin  (ANCEF ) IVPB 2g/100 mL premix  2 g Intravenous Q12H Fernand Prost, MD   Stopped at 08/19/24 9057   furosemide  (LASIX ) injection 60 mg  60 mg Intravenous BID Khan, Ghalib, MD   60 mg at 08/19/24 9092   heparin  injection 5,000 Units  5,000 Units Subcutaneous Q8H Khan, Ghalib, MD   5,000 Units at 08/19/24 9356   HYDROmorphone  (DILAUDID ) injection 0.5 mg  0.5 mg Intravenous Q4H PRN Khan, Ghalib, MD   0.5 mg at 08/19/24 1146   HYDROmorphone  (DILAUDID ) tablet 1 mg  1 mg Oral Q4H PRN Khan, Ghalib, MD       insulin  aspart (novoLOG ) injection 0-15 Units  0-15 Units Subcutaneous TID [facility] Khan, Ghalib, MD   5 Units at 08/19/24 1143   insulin  aspart (novoLOG ) injection 0-5 Units  0-5 Units Subcutaneous QHS Khan, Ghalib, MD   4 Units at 08/19/24 0030  ondansetron  (ZOFRAN ) tablet 4 mg  4 mg Oral Q6H PRN Fernand Prost, MD       Or   ondansetron  (ZOFRAN ) injection 4 mg  4 mg Intravenous Q6H PRN Khan, Ghalib, MD       senna-docusate (Senokot-S) tablet 1 tablet  1 tablet Oral QHS PRN Fernand Prost, MD       sodium chloride  flush  (NS) 0.9 % injection 3 mL  3 mL Intravenous Q12H Khan, Ghalib, MD   3 mL at 08/19/24 0907   Current Outpatient Medications  Medication Sig Dispense Refill   amLODipine  (NORVASC ) 10 MG tablet Take 10 mg by mouth daily.     aspirin  EC 81 MG tablet Take 81 mg by mouth daily.     atorvastatin  (LIPITOR ) 80 MG tablet Take 80 mg by mouth.  Take 80 mg by mouth in the morning.     bisacodyl  (DULCOLAX) 5 MG EC tablet Take 5 mg by mouth as needed for moderate constipation.     carvedilol  (COREG ) 3.125 MG tablet Take 1 tablet (3.125 mg total) by mouth 2 (two) times daily with a meal. 60 tablet 1   cyanocobalamin  (VITAMIN B12) 1000 MCG tablet Take 1,000 mcg by mouth daily.     dexlansoprazole (DEXILANT) 60 MG capsule Take 1 capsule by mouth daily.     empagliflozin  (JARDIANCE ) 10 MG TABS tablet Take 1 tablet (10 mg total) by mouth daily before breakfast. 30 tablet 0   Ferrous Sulfate  (IRON ) 325 (65 Fe) MG TABS Take 1 tablet by mouth daily.     isosorbide  mononitrate (IMDUR ) 120 MG 24 hr tablet Take 120 mg by mouth daily.     lactulose  (CHRONULAC ) 10 GM/15ML solution Taking 15-20 cc by mouth 3 to 4 times a day for constiption (Patient taking differently: Taking 15-20 cc by mouth 3 to 4 times a day for constiption as needed) 946 mL 2   nitroGLYCERIN  (NITROSTAT ) 0.4 MG SL tablet Place 0.4 mg under the tongue every 5 (five) minutes as needed for chest pain.     torsemide  (DEMADEX ) 20 MG tablet Take 3 tablets (60 mg total) by mouth daily.       Abtx:  Anti-infectives (From admission, onward)    Start     Dose/Rate Route Frequency Ordered Stop   08/18/24 2230  ceFAZolin  (ANCEF ) IVPB 2g/100 mL premix        2 g 200 mL/hr over 30 Minutes Intravenous Every 12 hours 08/18/24 2211 08/25/24 2159       REVIEW OF SYSTEMS:  Const: negative fever, negative chills, negative weight loss Eyes: negative diplopia or visual changes, negative eye pain ENT: negative coryza, negative sore throat Resp:  cough, ,  dyspnea Cards: negative for chest pain, palpitations, has lower extremity edema GU: negative for frequency, dysuria and hematuria GI: Negative for abdominal pain, diarrhea, bleeding, constipation Skin: negative for rash and pruritus Heme: negative for easy bruising and gum/nose bleeding MS: rt shoulder pain and left thumb pain Neurolo:negative for headaches, dizziness, vertigo, memory problems  Psych: negative for feelings of anxiety, depression  Endocrine: , diabetes Allergy/Immunology- negative for any medication or food allergies ?  Objective:  VITALS:  BP (!) 109/55   Pulse 71   Temp 97.8 F (36.6 C) (Axillary)   Resp (!) 21   Ht 6' 1 (1.854 m)   Wt 86.2 kg   SpO2 100%   BMI 25.07 kg/m   PHYSICAL EXAM:  General: lethargic ,, in  distress, chronically ill, pale  Head: Normocephalic, without obvious abnormality, atraumatic. Eyes: Conjunctivae clear, anicteric sclerae. Pupils are equal ENT Nares normal. No drainage or sinus tenderness. Lips, mucosa, and tongue normal. No Thrush Neck: , symmetrical, no adenopathy, thyroid : non tender no carotid bruit and no JVD. Back: did not examine Lungs:b/l air entry- decreased bases- crepts Heart: systolic murmur- irrgeular Abdomen: Soft, non-tender,not distended. Bowel sounds normal. No masses Extremities:b/l edema legs Left thumb MCP joint swollen- erthematous and very tender Rt shoulder tender to movt Skin: No rashes or lesions. Or bruising Lymph: Cervical, supraclavicular normal. Neurologic: Grossly non-focal Pertinent Labs Lab Results CBC    Component Value Date/Time   WBC 14.8 (H) 08/19/2024 0425   RBC 2.70 (L) 08/19/2024 0425   HGB 8.5 (L) 08/19/2024 0425   HGB 8.0 (L) 08/04/2024 1436   HGB 11.3 (L) 11/01/2020 1505   HCT 27.1 (L) 08/19/2024 0425   HCT 36.4 (L) 11/01/2020 1505   PLT 71 (L) 08/19/2024 0425   PLT 126 (L) 08/04/2024 1436   PLT 158 11/01/2020 1505   MCV 100.4 (H) 08/19/2024 0425   MCV 85 11/01/2020  1505   MCH 31.5 08/19/2024 0425   MCHC 31.4 08/19/2024 0425   RDW 17.2 (H) 08/19/2024 0425   RDW 20.1 (H) 11/01/2020 1505   LYMPHSABS 0.3 (L) 08/18/2024 1641   MONOABS 0.6 08/18/2024 1641   EOSABS 0.0 08/18/2024 1641   BASOSABS 0.0 08/18/2024 1641       Latest Ref Rng & Units 08/19/2024    4:25 AM 08/18/2024    4:41 PM 08/15/2024    4:27 AM  CMP  Glucose 70 - 99 mg/dL 788  796  846   BUN 8 - 23 mg/dL 76  69  46   Creatinine 0.61 - 1.24 mg/dL 7.33  7.46  8.12   Sodium 135 - 145 mmol/L 136  136  139   Potassium 3.5 - 5.1 mmol/L 4.8  4.9  3.9   Chloride 98 - 111 mmol/L 94  94  99   CO2 22 - 32 mmol/L 32  30  34   Calcium  8.9 - 10.3 mg/dL 89.5  88.7  89.6   Total Protein 6.5 - 8.1 g/dL 6.6  7.4    Total Bilirubin 0.0 - 1.2 mg/dL 1.9  2.5    Alkaline Phos 38 - 126 U/L 115  134    AST 15 - 41 U/L 23  31    ALT 0 - 44 U/L 15  18        Microbiology: Recent Results (from the past 240 hours)  Resp panel by RT-PCR (RSV, Flu A&B, Covid) Anterior Nasal Swab     Status: None   Collection Time: 08/18/24  4:41 PM   Specimen: Anterior Nasal Swab  Result Value Ref Range Status   SARS Coronavirus 2 by RT PCR NEGATIVE NEGATIVE Final    Comment: (NOTE) SARS-CoV-2 target nucleic acids are NOT DETECTED.  The SARS-CoV-2 RNA is generally detectable in upper respiratory specimens during the acute phase of infection. The lowest concentration of SARS-CoV-2 viral copies this assay can detect is 138 copies/mL. A negative result does not preclude SARS-Cov-2 infection and should not be used as the sole basis for treatment or other patient management decisions. A negative result may occur with  improper specimen collection/handling, submission of specimen other than nasopharyngeal swab, presence of viral mutation(s) within the areas targeted by this assay, and inadequate number of viral copies(<138 copies/mL). A negative result must be combined with clinical observations,  patient history, and  epidemiological information. The expected result is Negative.  Fact Sheet for Patients:  bloggercourse.com  Fact Sheet for Healthcare Providers:  seriousbroker.it  This test is no t yet approved or cleared by the [place]  FDA and  has been authorized for detection and/or diagnosis of SARS-CoV-2 by FDA under an Emergency Use Authorization (EUA). This EUA will remain  in effect (meaning this test can be used) for the duration of the COVID-19 declaration under Section 564(b)(1) of the Act, 21 U.S.C.section 360bbb-3(b)(1), unless the authorization is terminated  or revoked sooner.       Influenza A by PCR NEGATIVE NEGATIVE Final   Influenza B by PCR NEGATIVE NEGATIVE Final    Comment: (NOTE) The Xpert Xpress SARS-CoV-2/FLU/RSV plus assay is intended as an aid in the diagnosis of influenza from Nasopharyngeal swab specimens and should not be used as a sole basis for treatment. Nasal washings and aspirates are unacceptable for Xpert Xpress SARS-CoV-2/FLU/RSV testing.  Fact Sheet for Patients: bloggercourse.com  Fact Sheet for Healthcare Providers: seriousbroker.it  This test is not yet approved or cleared by the [place]  FDA and has been authorized for detection and/or diagnosis of SARS-CoV-2 by FDA under an Emergency Use Authorization (EUA). This EUA will remain in effect (meaning this test can be used) for the duration of the COVID-19 declaration under Section 564(b)(1) of the Act, 21 U.S.C. section 360bbb-3(b)(1), unless the authorization is terminated or revoked.     Resp Syncytial Virus by PCR NEGATIVE NEGATIVE Final    Comment: (NOTE) Fact Sheet for Patients: bloggercourse.com  Fact Sheet for Healthcare Providers: seriousbroker.it  This test is not yet approved or cleared by the [place]  FDA and has been  authorized for detection and/or diagnosis of SARS-CoV-2 by FDA under an Emergency Use Authorization (EUA). This EUA will remain in effect (meaning this test can be used) for the duration of the COVID-19 declaration under Section 564(b)(1) of the Act, 21 U.S.C. section 360bbb-3(b)(1), unless the authorization is terminated or revoked.  Performed at [facility], [address] [place], [place] [address]   Culture, blood (Routine X 2) w Reflex to ID Panel     Status: None (Preliminary result)   Collection Time: 08/18/24 10:33 PM   Specimen: BLOOD  Result Value Ref Range Status   Specimen Description BLOOD RIGHT ANTECUBITAL  Final   Special Requests   Final    BOTTLES DRAWN AEROBIC AND ANAEROBIC Blood Culture adequate volume   Culture  Setup Time   Final    GRAM POSITIVE COCCI IN BOTH AEROBIC AND ANAEROBIC BOTTLES Organism ID to follow CRITICAL RESULT CALLED TO, READ BACK BY AND VERIFIED WITH: ESTILL LUTES 08/19/24 0915 MW Performed at [facility], [address] [place], [place] [address]    Culture GRAM POSITIVE COCCI  Final   Report Status PENDING  Incomplete  Blood Culture ID Panel (Reflexed)     Status: Abnormal   Collection Time: 08/18/24 10:33 PM  Result Value Ref Range Status   Enterococcus faecalis NOT DETECTED NOT DETECTED Final   Enterococcus Faecium NOT DETECTED NOT DETECTED Final   Listeria monocytogenes NOT DETECTED NOT DETECTED Final   Staphylococcus species DETECTED (A) NOT DETECTED Final    Comment: CRITICAL RESULT CALLED TO, READ BACK BY AND VERIFIED WITH: SHEEMA HALLAJI 08/19/24 0915 MW    Staphylococcus aureus (BCID) DETECTED (A) NOT DETECTED Final    Comment: CRITICAL RESULT CALLED TO, READ BACK BY AND VERIFIED WITH: SHEEMA HALLAJI 08/19/24  0915 MW    Staphylococcus epidermidis NOT DETECTED NOT DETECTED Final   Staphylococcus lugdunensis NOT DETECTED NOT DETECTED Final   Streptococcus species NOT DETECTED NOT DETECTED Final    Streptococcus agalactiae NOT DETECTED NOT DETECTED Final   Streptococcus pneumoniae NOT DETECTED NOT DETECTED Final   Streptococcus pyogenes NOT DETECTED NOT DETECTED Final   A.calcoaceticus-baumannii NOT DETECTED NOT DETECTED Final   Bacteroides fragilis NOT DETECTED NOT DETECTED Final   Enterobacterales NOT DETECTED NOT DETECTED Final   Enterobacter cloacae complex NOT DETECTED NOT DETECTED Final   Escherichia coli NOT DETECTED NOT DETECTED Final   Klebsiella aerogenes NOT DETECTED NOT DETECTED Final   Klebsiella oxytoca NOT DETECTED NOT DETECTED Final   Klebsiella pneumoniae NOT DETECTED NOT DETECTED Final   Proteus species NOT DETECTED NOT DETECTED Final   Salmonella species NOT DETECTED NOT DETECTED Final   Serratia marcescens NOT DETECTED NOT DETECTED Final   Haemophilus influenzae NOT DETECTED NOT DETECTED Final   Neisseria meningitidis NOT DETECTED NOT DETECTED Final   Pseudomonas aeruginosa NOT DETECTED NOT DETECTED Final   Stenotrophomonas maltophilia NOT DETECTED NOT DETECTED Final   Candida albicans NOT DETECTED NOT DETECTED Final   Candida auris NOT DETECTED NOT DETECTED Final   Candida glabrata NOT DETECTED NOT DETECTED Final   Candida krusei NOT DETECTED NOT DETECTED Final   Candida parapsilosis NOT DETECTED NOT DETECTED Final   Candida tropicalis NOT DETECTED NOT DETECTED Final   Cryptococcus neoformans/gattii NOT DETECTED NOT DETECTED Final   Meth resistant mecA/C and MREJ NOT DETECTED NOT DETECTED Final    Comment: Performed at [facility], [address] [place], [place] [address]     IMAGING RESULTS:  I have personally reviewed the films Rt pleural effusion Cardiomegaly    ?  Afib RBBB  Impression/Recommendation ? 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 This consult involved complex  antimicrobial management   ________________________________________________ Discussed with patient, son at bed side , Hospitalist and  ID pharmacist      [1] No Known Allergies

## 2024-08-19 NOTE — ED Notes (Signed)
 Mouth care performed and provided patient with swabs to help with his dry mouth. Patient refused to take his oral medication. Offered Tylenol  supp d/t having pain and he refused it.

## 2024-08-19 NOTE — Progress Notes (Signed)
 PHARMACY - PHYSICIAN COMMUNICATION CRITICAL VALUE ALERT - BLOOD CULTURE IDENTIFICATION (BCID)  [name] is an 79 y.o. male who presented to [facility] on 08/18/2024 with a chief complaint of weakness  Assessment:  1/6 blood cultures with GPC, BCID detects MSSA.  He is on cefazolin  for cellulitis (hand).  Patinet has PMH of TAVR (placed June 2025)  Name of physician (or Provider) Contacted: Dr Cosette  Current antibiotics: Cefazolin   Changes to prescribed antibiotics recommended:  Patient is on recommended antibiotics - No changes needed Auto-ID consult. Patient added to ID consult list.   Results for orders placed or performed during the hospital encounter of 08/18/24  Blood Culture ID Panel (Reflexed) (Collected: 08/18/2024 10:33 PM)  Result Value Ref Range   Enterococcus faecalis NOT DETECTED NOT DETECTED   Enterococcus Faecium NOT DETECTED NOT DETECTED   Listeria monocytogenes NOT DETECTED NOT DETECTED   Staphylococcus species DETECTED (A) NOT DETECTED   Staphylococcus aureus (BCID) DETECTED (A) NOT DETECTED   Staphylococcus epidermidis NOT DETECTED NOT DETECTED   Staphylococcus lugdunensis NOT DETECTED NOT DETECTED   Streptococcus species NOT DETECTED NOT DETECTED   Streptococcus agalactiae NOT DETECTED NOT DETECTED   Streptococcus pneumoniae NOT DETECTED NOT DETECTED   Streptococcus pyogenes NOT DETECTED NOT DETECTED   A.calcoaceticus-baumannii NOT DETECTED NOT DETECTED   Bacteroides fragilis NOT DETECTED NOT DETECTED   Enterobacterales NOT DETECTED NOT DETECTED   Enterobacter cloacae complex NOT DETECTED NOT DETECTED   Escherichia coli NOT DETECTED NOT DETECTED   Klebsiella aerogenes NOT DETECTED NOT DETECTED   Klebsiella oxytoca NOT DETECTED NOT DETECTED   Klebsiella pneumoniae NOT DETECTED NOT DETECTED   Proteus species NOT DETECTED NOT DETECTED   Salmonella species NOT DETECTED NOT DETECTED   Serratia marcescens NOT DETECTED NOT DETECTED   Haemophilus influenzae  NOT DETECTED NOT DETECTED   Neisseria meningitidis NOT DETECTED NOT DETECTED   Pseudomonas aeruginosa NOT DETECTED NOT DETECTED   Stenotrophomonas maltophilia NOT DETECTED NOT DETECTED   Candida albicans NOT DETECTED NOT DETECTED   Candida auris NOT DETECTED NOT DETECTED   Candida glabrata NOT DETECTED NOT DETECTED   Candida krusei NOT DETECTED NOT DETECTED   Candida parapsilosis NOT DETECTED NOT DETECTED   Candida tropicalis NOT DETECTED NOT DETECTED   Cryptococcus neoformans/gattii NOT DETECTED NOT DETECTED   Meth resistant mecA/C and MREJ NOT DETECTED NOT DETECTED    Celestine Slovak, PharmD, BCPS, BCIDP Work Cell: [phone] 08/19/2024 11:22 AM

## 2024-08-20 DIAGNOSIS — M25511 Pain in right shoulder: Secondary | ICD-10-CM | POA: Diagnosis not present

## 2024-08-20 DIAGNOSIS — D631 Anemia in chronic kidney disease: Secondary | ICD-10-CM

## 2024-08-20 DIAGNOSIS — N184 Chronic kidney disease, stage 4 (severe): Secondary | ICD-10-CM | POA: Diagnosis not present

## 2024-08-20 DIAGNOSIS — Z515 Encounter for palliative care: Secondary | ICD-10-CM | POA: Diagnosis not present

## 2024-08-20 DIAGNOSIS — N1832 Chronic kidney disease, stage 3b: Secondary | ICD-10-CM

## 2024-08-20 DIAGNOSIS — I2510 Atherosclerotic heart disease of native coronary artery without angina pectoris: Secondary | ICD-10-CM | POA: Diagnosis not present

## 2024-08-20 DIAGNOSIS — Z952 Presence of prosthetic heart valve: Secondary | ICD-10-CM | POA: Diagnosis not present

## 2024-08-20 DIAGNOSIS — J90 Pleural effusion, not elsewhere classified: Secondary | ICD-10-CM | POA: Diagnosis not present

## 2024-08-20 DIAGNOSIS — M7989 Other specified soft tissue disorders: Secondary | ICD-10-CM | POA: Diagnosis not present

## 2024-08-20 DIAGNOSIS — N179 Acute kidney failure, unspecified: Secondary | ICD-10-CM | POA: Diagnosis not present

## 2024-08-20 DIAGNOSIS — R7881 Bacteremia: Secondary | ICD-10-CM | POA: Diagnosis not present

## 2024-08-20 DIAGNOSIS — R627 Adult failure to thrive: Secondary | ICD-10-CM | POA: Diagnosis not present

## 2024-08-20 DIAGNOSIS — Z951 Presence of aortocoronary bypass graft: Secondary | ICD-10-CM | POA: Diagnosis not present

## 2024-08-20 DIAGNOSIS — B9561 Methicillin susceptible Staphylococcus aureus infection as the cause of diseases classified elsewhere: Secondary | ICD-10-CM | POA: Diagnosis not present

## 2024-08-20 DIAGNOSIS — I272 Pulmonary hypertension, unspecified: Secondary | ICD-10-CM | POA: Diagnosis not present

## 2024-08-20 DIAGNOSIS — I509 Heart failure, unspecified: Secondary | ICD-10-CM | POA: Diagnosis not present

## 2024-08-20 DIAGNOSIS — I340 Nonrheumatic mitral (valve) insufficiency: Secondary | ICD-10-CM

## 2024-08-20 LAB — CBC
HCT: 22.9 % — ABNORMAL LOW (ref 39.0–52.0)
Hemoglobin: 7.2 g/dL — ABNORMAL LOW (ref 13.0–17.0)
MCH: 31 pg (ref 26.0–34.0)
MCHC: 31.4 g/dL (ref 30.0–36.0)
MCV: 98.7 fL (ref 80.0–100.0)
Platelets: 78 K/uL — ABNORMAL LOW (ref 150–400)
RBC: 2.32 MIL/uL — ABNORMAL LOW (ref 4.22–5.81)
RDW: 16.9 % — ABNORMAL HIGH (ref 11.5–15.5)
WBC: 11.2 K/uL — ABNORMAL HIGH (ref 4.0–10.5)
nRBC: 0 % (ref 0.0–0.2)

## 2024-08-20 LAB — BASIC METABOLIC PANEL WITH GFR
Anion gap: 12 (ref 5–15)
BUN: 88 mg/dL — ABNORMAL HIGH (ref 8–23)
CO2: 30 mmol/L (ref 22–32)
Calcium: 9.9 mg/dL (ref 8.9–10.3)
Chloride: 93 mmol/L — ABNORMAL LOW (ref 98–111)
Creatinine, Ser: 3.05 mg/dL — ABNORMAL HIGH (ref 0.61–1.24)
GFR, Estimated: 20 mL/min — ABNORMAL LOW
Glucose, Bld: 206 mg/dL — ABNORMAL HIGH (ref 70–99)
Potassium: 4.8 mmol/L (ref 3.5–5.1)
Sodium: 135 mmol/L (ref 135–145)

## 2024-08-20 LAB — PREPARE RBC (CROSSMATCH)

## 2024-08-20 LAB — GLUCOSE, CAPILLARY
Glucose-Capillary: 287 mg/dL — ABNORMAL HIGH (ref 70–99)
Glucose-Capillary: 183 mg/dL — ABNORMAL HIGH (ref 70–99)
Glucose-Capillary: 116 mg/dL — ABNORMAL HIGH (ref 70–99)
Glucose-Capillary: 194 mg/dL — ABNORMAL HIGH (ref 70–99)

## 2024-08-20 MED ORDER — GERHARDT'S BUTT CREAM
TOPICAL_CREAM | Freq: Three times a day (TID) | CUTANEOUS | Status: DC
  Filled 2024-08-20: qty 60

## 2024-08-20 MED ORDER — ACETAMINOPHEN 650 MG RE SUPP: 650.0000 mg | Freq: Three times a day (TID) | RECTAL | Status: DC

## 2024-08-20 MED ORDER — METHOCARBAMOL 500 MG PO TABS
500.0000 mg | ORAL_TABLET | Freq: Four times a day (QID) | ORAL | Status: DC
  Administered 2024-08-20 – 2024-08-22 (×8): 500 mg via ORAL
  Filled 2024-08-20 (×8): qty 1

## 2024-08-20 MED ORDER — SODIUM CHLORIDE 0.9 % IV SOLN: INTRAVENOUS | Status: AC

## 2024-08-20 MED ORDER — METHOCARBAMOL 1000 MG/10ML IJ SOLN: 500.0000 mg | Freq: Four times a day (QID) | INTRAMUSCULAR | Status: DC

## 2024-08-20 MED ORDER — SODIUM CHLORIDE 0.9% IV SOLUTION: Freq: Once | INTRAVENOUS | Status: AC

## 2024-08-20 MED ORDER — ACETAMINOPHEN 325 MG PO TABS
650.0000 mg | ORAL_TABLET | Freq: Three times a day (TID) | ORAL | Status: DC
  Administered 2024-08-20 – 2024-08-21 (×6): 650 mg via ORAL
  Filled 2024-08-20 (×6): qty 2

## 2024-08-20 MED ORDER — ACETAMINOPHEN 325 MG PO TABS: 650.0000 mg | ORAL_TABLET | Freq: Four times a day (QID) | ORAL | Status: DC | PRN

## 2024-08-20 MED ORDER — SODIUM CHLORIDE 0.9 % IV SOLN: INTRAVENOUS | Status: DC

## 2024-08-20 NOTE — Progress Notes (Signed)
 Patient notes that his thumb continues to feel better.  Less pain with range of motion.  No significant erythema or swelling.  Shoulder symptoms remain relatively unchanged. Per son at bedside, patient is still drowsy and concerned about pain control at [facility].    Exam:  L thumb MCP with essentially full RoM with no significant pain. No notable erythema or swelling about L thumb/wrist/forearm.   RoM bilateral shoulders: Able to passively FF to 130, passively ER to 50 with minimal pain.   Clinically, patient does not appear to have septic arthritis of the shoulder joints or the L thumb MCP joint. Will plan to follow peripherally, please page with any questions. We can certainly evaluate the patient as an outpatient for any persistent symptoms at the time of discharge.

## 2024-08-20 NOTE — Progress Notes (Addendum)
 " [facility] CLINIC CARDIOLOGY PROGRESS NOTE       Patient ID: [name] MRN: [id number] DOB/AGE: [birth year] 79 y.o.  Admit date: 08/18/2024 Referring Physician Dr. Morene Bathe Primary Physician [name], MD  Primary Cardiologist Dr. Ammon Reason for Consultation AoCHF  HPI: [name] is a 79 y.o. 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 Cardiology was consulted for further evaluation.   Interval history: -Patient seen and examined this AM, resting in bed with son at bedside.  -Respiratory status remains the same, he denies any change in his breathing.  -Cr continues to uptrend without much documented UOP.  -Hgb down to 7.2 on AM labs.   Review of systems complete and found to be negative unless listed above    Past Medical History:  Diagnosis Date   (HFpEF) heart failure with preserved ejection fraction (HCC) 07/25/2016   a.)TTE 07/25/16: EF >55, triv-mild pan regur, mild AS, G2DD; b.)TTE 09/02/16: EF >55, triv TR, mild AS; c.)TTE 11/05/16: EF >55, LAE, triv-mild pan regur, mild AS, G2DD; d.)TTE 01/21/20: EF >55, mod LVH, LAE/RVE, triv-mild AR/PR/TR, mod MR, mild AS, G2DD; e.)TTE 09/24/22: EF>55, sev LVH, sev LAE, RAE, mild PR, mod AR/MR/TR, mild-mod AS, RVSP 74.7; f.)TTE 12/23/22: EF 60-65, LAE, mild-mod MR/TR, mild AS   Abnormal urine 05/01/2023   Anemia    Aortic atherosclerosis    Aortic stenosis 07/25/2016   a.) TTE 07/25/2016: mild (MPG 13.5); b.) TTE 09/02/2016: mild (MPG 16); c.) TTE 11/05/2016: mild (MPG 9); d.) TTE 01/21/2020: mild (MPG 11.7); e.) TTE 09/24/2022: mild-mod (MPG 20.3); f.) TTE 12/23/2022: mild (MPG 15)   Atrial  fibrillation (HCC)    a.) CHA2DS2VASc = 6 (age x2, CHF, HTN, vascular disease history, T2DM);  b.) rate/rhythm maintained on oral labetalol ; chronically anticoagulated with apixaban  + clopidogrel    AV block, 1st degree    B12 deficiency    CKD (chronic kidney disease), stage III (HCC)    Colon polyps    Congestive heart failure (HCC) 05/01/2023   Coronary artery disease 08/16/2016   a.) s/p 3v CABG 08/30/2016; b.) s/p PCI 11/26/2016 (DES x 1 oSVG-RCA); c.) s/p PCI 12/17/2016 (DES x 5 --> mLCx x2, dLCx, pLAD, dLAD); c.) s/p PCI 06/09/2020 (DES x1 --> ISR oSVG-PDA)   Cyst of kidney, acquired 07/03/2023   Dyspnea    Gastroesophageal reflux disease 05/01/2023   GERD (gastroesophageal reflux disease)    Heart murmur    Hepatic steatosis    History of 2019 novel coronavirus disease (COVID-19) 09/02/2020   a.) Tx'd with remdesivir    History of blood transfusion    History of GI bleed 09/02/2020   a.) admitted to [facility] 09/02/2020 - 09/08/2020   History of heart artery stent 11/26/2016   TOTAL of 7 stents: a.) 11/26/2016 --> 3.5 x 22 mm Resolute Integrity oSVG-RCA; b.) 12/17/2016: 3.0 x 12 mm Xience Alpine dLCx, 3.5 x 22 mm Resolute Onyx mLCx, 3.5 x 18 mm Resolute Onyx mLCx, 3.0 x 26 mm Resolute Onyx pLAD, 2.0 x 26 mm Resolute Onyx dLAD; c.) 06/09/2020 --> 2.5 x 18 mm Resolute Onyx ISR oSVG-PDA   History of partial colectomy 08/12/2012   a.) large gastric hyperplastic polyp removal   HLD (hyperlipidemia)  Hypertension    LBBB (left bundle branch block)    Long term current use of anticoagulant    a.) apixaban    Long term current use of antithrombotics/antiplatelets    a.) clopidogrel    Male hypogonadism    a.) on exogenous TRT (1.62% gel)   Mild cardiomegaly    Mild gynecomastia (bilateral)    Nephrolithiasis    OSA (obstructive sleep apnea)    a.) does not utilize nocturnal PAP therapy   Personal history of surgery to heart and great vessels, presenting hazards to health 05/01/2023    Proteinuria, unspecified 08/01/2023   Pulmonary hypertension (HCC) 09/24/2022   a.) TTE 09/24/2022: RVSP 74.7; b.) TTE 12/23/2022: RVSP 31.3   RBBB (right bundle branch block with left anterior fascicular block)    Right inguinal hernia    Right thyroid  nodule    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   a.) LIMA-LAD, SVG-PDA, SVG-OM3   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/01/2023   Type 2 diabetes mellitus treated with insulin  (HCC)    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   Umbilical hernia    Unstable angina (HCC)    Vitamin D  deficiency     Past Surgical History:  Procedure Laterality Date   ANOMALOUS PULMONARY VENOUS RETURN REPAIR, TOTAL  2025   APPENDECTOMY     CARDIAC CATHETERIZATION Left 08/16/2016   Procedure: Left Heart Cath and Coronary Angiography;  Surgeon: Cara JONETTA Lovelace, MD;  Location: [facility] INVASIVE CV LAB;  Service: Cardiovascular;  Laterality: Left;   COLONOSCOPY N/A 09/07/2020   Procedure: COLONOSCOPY;  Surgeon: Janalyn Keene NOVAK, MD;  Location: [facility] ENDOSCOPY;  Service: Endoscopy;  Laterality: N/A;   COLONOSCOPY N/A 10/19/2023   Procedure: COLONOSCOPY;  Surgeon: Jinny Carmine, MD;  Location: [facility] ENDOSCOPY;  Service: Endoscopy;  Laterality: N/A;   COLONOSCOPY WITH PROPOFOL  N/A 05/17/2021   Procedure: COLONOSCOPY WITH PROPOFOL ;  Surgeon: Janalyn Keene NOVAK, MD;  Location: [facility] ENDOSCOPY;  Service: Endoscopy;  Laterality: N/A;   CORONARY ANGIOPLASTY WITH STENT PLACEMENT Left 11/26/2016   Procedure: CORONARY ANGIOPLASTY WITH STENT PLACEMENT; Location: [facility]; Surgeon: Alm Dais, MD   CORONARY ARTERY BYPASS GRAFT N/A 08/30/2016   Procedure: CORONARY ARTERY BYPASS GRAFT; Location: [facility]; Surgeon: Juliene Pouch, MD   CORONARY STENT INTERVENTION N/A 06/09/2020   Procedure: CORONARY STENT INTERVENTION;  Surgeon: Lovelace Cara JONETTA, MD;  Location: [facility] INVASIVE CV LAB;  Service: Cardiovascular;  Laterality: N/A;   CORONARY STENT  INTERVENTION Left 12/17/2016   Procedure: CORONARY STENT INTERVENTION; Location: [facility]; Surgeon: Alm Dais, MD   CORONARY STENT INTERVENTION  03/2024   ENDOSCOPIC MUCOSAL RESECTION N/A 09/22/2020   Procedure: ENDOSCOPIC MUCOSAL RESECTION;  Surgeon: [name] Aloha Raddle., MD;  Location: [facility] ENDOSCOPY;  Service: Gastroenterology;  Laterality: N/A;   ESOPHAGOGASTRODUODENOSCOPY N/A 09/07/2020   Procedure: ESOPHAGOGASTRODUODENOSCOPY (EGD);  Surgeon: Janalyn Keene NOVAK, MD;  Location: [facility] ENDOSCOPY;  Service: Endoscopy;  Laterality: N/A;   ESOPHAGOGASTRODUODENOSCOPY N/A 10/19/2023   Procedure: EGD (ESOPHAGOGASTRODUODENOSCOPY);  Surgeon: Jinny Carmine, MD;  Location: [facility] ENDOSCOPY;  Service: Endoscopy;  Laterality: N/A;   ESOPHAGOGASTRODUODENOSCOPY (EGD) WITH PROPOFOL  N/A 09/22/2020   Procedure: ESOPHAGOGASTRODUODENOSCOPY (EGD) WITH PROPOFOL ;  Surgeon: [name] Aloha Raddle., MD;  Location: [facility] ENDOSCOPY;  Service: Gastroenterology;  Laterality: N/A;   ESOPHAGOGASTRODUODENOSCOPY (EGD) WITH PROPOFOL  N/A 08/26/2023   Procedure: ESOPHAGOGASTRODUODENOSCOPY (EGD) WITH PROPOFOL ;  Surgeon: Jinny Carmine, MD;  Location: [facility] ENDOSCOPY;  Service: Endoscopy;  Laterality: N/A;   FRACTURE SURGERY     HEMOSTASIS CLIP PLACEMENT  09/22/2020  Procedure: HEMOSTASIS CLIP PLACEMENT;  Surgeon: [name] Aloha Raddle., MD;  Location: [facility] ENDOSCOPY;  Service: Gastroenterology;;   HEMOSTASIS CONTROL  09/22/2020   Procedure: HEMOSTASIS CONTROL;  Surgeon: [name] Aloha Raddle., MD;  Location: [facility] ENDOSCOPY;  Service: Gastroenterology;;   INSERTION OF MESH  02/19/2023   Procedure: INSERTION OF MESH;  Surgeon: Desiderio Schanz, MD;  Location: [facility] ORS;  Service: General;;  umbilical   LAPAROSCOPIC PARTIAL RIGHT COLECTOMY Right 08/12/2012   Procedure: LAPAROSCOPIC PARTIAL RIGHT COLECTOMY; Location: [facility]; Surgeon: Unknown Sharps, MD   LEFT HEART CATH AND CORONARY ANGIOGRAPHY Left 06/09/2020   Procedure: LEFT HEART CATH AND  CORONARY ANGIOGRAPHY;  Surgeon: Florencio Cara BIRCH, MD;  Location: [facility] INVASIVE CV LAB;  Service: Cardiovascular;  Laterality: Left;   LEFT HEART CATH AND CORS/GRAFTS ANGIOGRAPHY Left 11/20/2016   Procedure: Left Heart Cath and Cors/Grafts Angiography;  Surgeon: Cara BIRCH Florencio, MD;  Location: [facility] INVASIVE CV LAB;  Service: Cardiovascular;  Laterality: Left;   POLYPECTOMY  09/22/2020   Procedure: POLYPECTOMY;  Surgeon: [name] Aloha Raddle., MD;  Location: [facility] ENDOSCOPY;  Service: Gastroenterology;;   POLYPECTOMY  10/19/2023   Procedure: POLYPECTOMY;  Surgeon: Jinny Carmine, MD;  Location: [facility] ENDOSCOPY;  Service: Endoscopy;;   RIGHT HEART CATH Right 07/01/2024   Procedure: RIGHT HEART CATH;  Surgeon: Ammon Blunt, MD;  Location: [facility] INVASIVE CV LAB;  Service: Cardiovascular;  Laterality: Right;   SUBMUCOSAL LIFTING INJECTION  09/22/2020   Procedure: SUBMUCOSAL LIFTING INJECTION;  Surgeon: [name] Aloha Raddle., MD;  Location: [facility] ENDOSCOPY;  Service: Gastroenterology;;   UMBILICAL HERNIA REPAIR N/A 02/19/2023   Procedure: HERNIA REPAIR UMBILICAL ADULT, open;  Surgeon: Desiderio Schanz, MD;  Location: [facility] ORS;  Service: General;  Laterality: N/A;   XI ROBOTIC ASSISTED INGUINAL HERNIA REPAIR WITH MESH Right 02/19/2023   Procedure: XI ROBOTIC ASSISTED INGUINAL HERNIA REPAIR WITH MESH;  Surgeon: Desiderio Schanz, MD;  Location: [facility] ORS;  Service: General;  Laterality: Right;    Medications Prior to Admission  Medication Sig Dispense Refill Last Dose/Taking   amLODipine  (NORVASC ) 10 MG tablet Take 10 mg by mouth daily.      aspirin  EC 81 MG tablet Take 81 mg by mouth daily.      atorvastatin  (LIPITOR ) 80 MG tablet Take 80 mg by mouth.  Take 80 mg by mouth in the morning.      bisacodyl  (DULCOLAX) 5 MG EC tablet Take 5 mg by mouth as needed for moderate constipation.      carvedilol  (COREG ) 3.125 MG tablet Take 1 tablet (3.125 mg total) by mouth 2 (two) times daily with a meal. 60 tablet  1    cyanocobalamin  (VITAMIN B12) 1000 MCG tablet Take 1,000 mcg by mouth daily.      dexlansoprazole (DEXILANT) 60 MG capsule Take 1 capsule by mouth daily.      empagliflozin  (JARDIANCE ) 10 MG TABS tablet Take 1 tablet (10 mg total) by mouth daily before breakfast. 30 tablet 0    Ferrous Sulfate  (IRON ) 325 (65 Fe) MG TABS Take 1 tablet by mouth daily.      isosorbide  mononitrate (IMDUR ) 120 MG 24 hr tablet Take 120 mg by mouth daily.      lactulose  (CHRONULAC ) 10 GM/15ML solution Taking 15-20 cc by mouth 3 to 4 times a day for constiption (Patient taking differently: Taking 15-20 cc by mouth 3 to 4 times a day for constiption as needed) 946 mL 2    nitroGLYCERIN  (NITROSTAT ) 0.4 MG SL tablet Place 0.4 mg under the tongue every  5 (five) minutes as needed for chest pain.      torsemide  (DEMADEX ) 20 MG tablet Take 3 tablets (60 mg total) by mouth daily.      Social History   Socioeconomic History   Marital status: Married    Spouse name: Not on file   Number of children: Not on file   Years of education: Not on file   Highest education level: Not on file  Occupational History   Not on file  Tobacco Use   Smoking status: Former    Current packs/day: 0.00    Types: Cigarettes    Quit date: 2000    Years since quitting: 26.0    Passive exposure: Never   Smokeless tobacco: Never  Vaping Use   Vaping status: Never Used  Substance and Sexual Activity   Alcohol  use: No   Drug use: No   Sexual activity: Not Currently  Other Topics Concern   Not on file  Social History Narrative   Not on file   Social Drivers of Health   Tobacco Use: Medium Risk (08/10/2024)   Patient History    Smoking Tobacco Use: Former    Smokeless Tobacco Use: Never    Passive Exposure: Never  Physicist, Medical Strain: Low Risk  (05/08/2024)   Received from [facility]   Overall Financial Resource Strain (CARDIA)    Difficulty of Paying Living Expenses: Not hard at all  Food Insecurity:  No Food Insecurity (08/20/2024)   Epic    Worried About Radiation Protection Practitioner of Food in the Last Year: Never true    Ran Out of Food in the Last Year: Never true  Transportation Needs: No Transportation Needs (08/20/2024)   Epic    Lack of Transportation (Medical): No    Lack of Transportation (Non-Medical): No  Physical Activity: Not on file  Stress: Not on file  Social Connections: Unknown (08/20/2024)   Social Connection and Isolation Panel    Frequency of Communication with Friends and Family: More than three times a week    Frequency of Social Gatherings with Friends and Family: More than three times a week    Attends Religious Services: Not on Marketing Executive or Organizations: Not on file    Attends Banker Meetings: Not on file    Marital Status: Not on file  Recent Concern: Social Connections - Moderately Isolated (07/18/2024)   Social Connection and Isolation Panel    Frequency of Communication with Friends and Family: More than three times a week    Frequency of Social Gatherings with Friends and Family: More than three times a week    Attends Religious Services: Never    Database Administrator or Organizations: No    Attends Banker Meetings: Never    Marital Status: Married  Catering Manager Violence: Not At Risk (08/20/2024)   Epic    Fear of Current or Ex-Partner: No    Emotionally Abused: No    Physically Abused: No    Sexually Abused: No  Depression (PHQ2-9): Low Risk (07/16/2024)   Depression (PHQ2-9)    PHQ-2 Score: 0  Alcohol  Screen: Not on file  Housing: Low Risk (08/20/2024)   Epic    Unable to Pay for Housing in the Last Year: No    Number of Times Moved in the Last Year: 0    Homeless in the Last Year: No  Utilities: Not At Risk (08/20/2024)   Epic  Threatened with loss of utilities: No  Health Literacy: Not on file    Family History  Problem Relation Age of Onset   Hyperlipidemia Mother    Heart disease Mother     Hypertension Father      Vitals:   08/19/24 2229 08/20/24 0350 08/20/24 0500 08/20/24 0915  BP: (!) 110/58 118/63  (!) 114/51  Pulse: 72 79  77  Resp: 20 18  20   Temp: (!) 97.4 F (36.3 C) 98.3 F (36.8 C)  97.6 F (36.4 C)  TempSrc:      SpO2: 95% 97%  100%  Weight:   85.6 kg   Height:        PHYSICAL EXAM General: Ill-appearing male, in no acute distress. HEENT: Normocephalic and atraumatic. Neck: No JVD.  Lungs: Normal respiratory effort on 5L [place].  Diminished bilaterally. Heart: Irregularly irregular, controlled rate. Normal S1 and S2 without gallops or murmurs.  Abdomen: Non-distended appearing.  Msk: Normal strength and tone for age. Extremities: Warm and well perfused. No clubbing, cyanosis.  Chronic appearing but overall improved edema.  Neuro: Alert and oriented X 3. Psych: Answers questions appropriately.   Labs: Basic Metabolic Panel: Recent Labs    08/18/24 1641 08/19/24 0425 08/20/24 0420  NA 136 136 135  K 4.9 4.8 4.8  CL 94* 94* 93*  CO2 30 32 30  GLUCOSE 203* 211* 206*  BUN 69* 76* 88*  CREATININE 2.53* 2.66* 3.05*  CALCIUM  11.2* 10.4* 9.9  MG 2.0  --   --    Liver Function Tests: Recent Labs    08/18/24 1641 08/19/24 0425  AST 31 23  ALT 18 15  ALKPHOS 134* 115  BILITOT 2.5* 1.9*  PROT 7.4 6.6  ALBUMIN 3.4* 3.0*   No results for input(s): LIPASE, AMYLASE in the last 72 hours. CBC: Recent Labs    08/18/24 1641 08/19/24 0425 08/20/24 0420  WBC 16.9* 14.8* 11.2*  NEUTROABS 15.7*  --   --   HGB 8.6* 8.5* 7.2*  HCT 27.4* 27.1* 22.9*  MCV 100.0 100.4* 98.7  PLT 86* 71* 78*   Cardiac Enzymes: No results for input(s): CKTOTAL, CKMB, CKMBINDEX, TROPONINIHS in the last 72 hours. BNP: No results for input(s): BNP in the last 72 hours. D-Dimer: No results for input(s): DDIMER in the last 72 hours. Hemoglobin A1C: No results for input(s): HGBA1C in the last 72 hours. Fasting Lipid Panel: No results for input(s):  CHOL, HDL, LDLCALC, TRIG, CHOLHDL, LDLDIRECT in the last 72 hours. Thyroid  Function Tests: No results for input(s): TSH, T4TOTAL, T3FREE, THYROIDAB in the last 72 hours.  Invalid input(s): FREET3 Anemia Panel: Recent Labs    08/18/24 2210  VITAMINB12 1,236*  FOLATE 12.3     Radiology: ECHOCARDIOGRAM COMPLETE Result Date: 08/19/2024    ECHOCARDIOGRAM REPORT   Patient Name:   ARISH REDNER [facility] Date of Exam: 08/18/2024 Medical Rec #:  [id number]        Height:       73.0 in Accession #:    [id number]       Weight:       190.0 lb Date of Birth:  27-Sep-1945         BSA:          2.105 m Patient Age:    78 years         BP:           124/76 mmHg Patient Gender: M  HR:           115 bpm. Exam Location:  [facility] Procedure: 2D Echo, Cardiac Doppler and Color Doppler (Both Spectral and Color            Flow Doppler were utilized during procedure). Indications:     I50.21 Acute Systolic CHF  History:         Patient has prior history of Echocardiogram examinations, most                  recent 03/03/2024. CHF, CAD, Prior CABG and TAVR-01/23/24,                  Arrythmias:LBBB, RBBB and Atrial Fibrillation,                  Signs/Symptoms:Murmur and Dyspnea; Risk Factors:Hypertension,                  Diabetes and Dyslipidemia.  Sonographer:     Carl Coma RDCS Referring Phys:  [id number] MORENE BATHE Diagnosing Phys: Dwayne D Callwood MD IMPRESSIONS  1. Left ventricular ejection fraction, by estimation, is 60 to 65%. The left ventricle has normal function. The left ventricle has no regional wall motion abnormalities. There is severe concentric left ventricular hypertrophy. Left ventricular diastolic  parameters are consistent with Grade III diastolic dysfunction (restrictive). There is the interventricular septum is flattened in diastole ('D' shaped left ventricle), consistent with right ventricular volume overload.  2. Right ventricular systolic function is moderately reduced.  The right ventricular size is moderately enlarged. Mildly increased right ventricular wall thickness.  3. Left atrial size was severely dilated.  4. Right atrial size was moderately dilated.  5. The mitral valve is grossly normal. Moderate mitral valve regurgitation. Moderate mitral annular calcification.  6. Tricuspid valve regurgitation is moderate to severe.  7. The aortic valve is grossly normal. Aortic valve regurgitation is mild. Aortic valve sclerosis/calcification is present, without any evidence of aortic stenosis. 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 FINDINGS  Left Ventricle: Left ventricular ejection fraction, by estimation, is 60 to 65%. The left ventricle has normal function. The left ventricle has no regional wall motion abnormalities. Strain was performed and the global longitudinal strain is indeterminate. The left ventricular internal cavity size was normal in size. There is severe concentric left ventricular hypertrophy. The interventricular septum is flattened in diastole ('D' shaped left ventricle), consistent with right ventricular volume overload. Left ventricular diastolic parameters are consistent with Grade III diastolic dysfunction (restrictive). Right Ventricle: The right ventricular size is moderately enlarged. Mildly increased right ventricular wall thickness. Right ventricular systolic function is moderately reduced. Left Atrium: Left atrial size was severely dilated. Right Atrium: Right atrial size was moderately dilated. Pericardium: There is no evidence of pericardial effusion. Mitral Valve: The mitral valve is grossly normal. There is moderate thickening of the mitral valve leaflet(s). There is moderate calcification of the mitral valve leaflet(s). Moderate mitral annular calcification. Moderate mitral valve regurgitation. Tricuspid Valve: The tricuspid valve is grossly normal. Tricuspid valve regurgitation is moderate to  severe. The aortic valve is grossly normal. Aortic valve regurgitation is mild. Aortic valve sclerosis/calcification is present, without any evidence of aortic stenosis. 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 Pulmonic Valve: The pulmonic valve was normal in structure. Pulmonic valve regurgitation is not visualized. Aorta: The aortic root was not  well visualized. IAS/Shunts: No atrial level shunt detected by color flow Doppler. Additional Comments: 3D was performed not requiring image post processing on an independent workstation and was indeterminate.  LEFT VENTRICLE PLAX 2D LVIDd:         4.50 cm LVIDs:         3.20 cm LV PW:         1.80 cm LV IVS:        1.80 cm  RIGHT VENTRICLE            IVC RV Basal diam:  4.90 cm    IVC diam: 2.10 cm RV S prime:     6.74 cm/s TAPSE (M-mode): 1.2 cm LEFT ATRIUM             Index        RIGHT ATRIUM           Index LA diam:        5.30 cm 2.52 cm/m   RA Area:     28.30 cm LA Vol (A2C):   94.4 ml 44.84 ml/m  RA Volume:   102.00 ml 48.45 ml/m LA Vol (A4C):   92.0 ml 43.70 ml/m LA Biplane Vol: 97.0 ml 46.08 ml/m  AORTIC VALVE AV Vmax:           302.00 cm/s AV Vmean:          212.200 cm/s AV VTI:            0.447 m AV Peak Grad:      36.5 mmHg AV Mean Grad:      20.6 mmHg LVOT Vmax:         81.03 cm/s LVOT Vmean:        52.067 cm/s LVOT VTI:          0.135 m LVOT/AV VTI ratio: 0.30 AI PHT:            277 msec  AORTA Ao Root diam: 3.40 cm Ao Asc diam:  3.90 cm MR Peak grad: 112.8 mmHg    TRICUSPID VALVE MR Vmax:      531.00 cm/s   TR Peak grad:   58.7 mmHg MV E velocity: 163.25 cm/s  TR Vmax:        383.00 cm/s                              SHUNTS                             Systemic VTI: 0.14 m Cara JONETTA Lovelace MD Electronically signed by Cara JONETTA Lovelace MD Signature Date/Time: 08/19/2024/12:52:25 PM    Final    DG Hand Complete Left Result Date:  08/19/2024 EXAM: 3 OR MORE VIEW(S) XRAY OF THE LEFT HAND 08/19/2024 12:49:00 AM COMPARISON: 08/18/2024. CLINICAL HISTORY: Swelling. FINDINGS: BONES AND JOINTS: No acute fracture, subluxation or dislocation. No malalignment. Calcifications about the left wrist, likely synovial calcifications. Arthritic changes throughout the left wrist. SOFT TISSUES: Small radiopaque foreign body noted in the soft tissues of the left middle finger anterior to the middle phalanx. IMPRESSION: 1. Small radiopaque foreign body in the soft tissues of the left middle finger anterior to the middle phalanx. 2. No acute fracture, subluxation, or dislocation. Electronically signed by: Franky Crease MD 08/19/2024 01:00 AM EST RP Workstation: [id number]   US  Venous Img Upper Uni Left (DVT) Result Date: 08/19/2024 CLINICAL  DATA:  Swelling EXAM: Left UPPER EXTREMITY VENOUS DOPPLER ULTRASOUND TECHNIQUE: Gray-scale sonography with graded compression, as well as color Doppler and duplex ultrasound were performed to evaluate the upper extremity deep venous system from the level of the subclavian vein and including the jugular, axillary, basilic, radial, ulnar and upper cephalic vein. Spectral Doppler was utilized to evaluate flow at rest and with distal augmentation maneuvers. COMPARISON:  None Available. FINDINGS: Contralateral Subclavian Vein: Respiratory phasicity is normal and symmetric with the symptomatic side. No evidence of thrombus. Internal Jugular Vein: No evidence of thrombus. Normal compressibility, respiratory phasicity and response to augmentation. Subclavian Vein: No evidence of thrombus. Normal respiratory phasicity and response to augmentation. Axillary Vein: No evidence of thrombus. Normal compressibility, respiratory phasicity and response to augmentation. Cephalic Vein: No evidence of thrombus. Normal compressibility, respiratory phasicity and response to augmentation. Basilic Vein: No evidence of thrombus. Normal compressibility,  respiratory phasicity and response to augmentation. Brachial Veins: No evidence of thrombus. Normal compressibility, respiratory phasicity and response to augmentation. Radial Veins: No evidence of thrombus. Normal compressibility, respiratory phasicity and response to augmentation. Ulnar Veins: No evidence of thrombus. Normal compressibility, respiratory phasicity and response to augmentation. IMPRESSION: No evidence of DVT within the left upper extremity. Electronically Signed   By: Luke Bun M.D.   On: 08/19/2024 00:31   DG Shoulder Left Result Date: 08/18/2024 EXAM: 1 VIEW(S) XRAY OF THE LEFT SHOULDER 08/18/2024 07:58:00 PM COMPARISON: None available. CLINICAL HISTORY: Shoulder pain, left. FINDINGS: BONES AND JOINTS: Glenohumeral joint is normally aligned. No acute fracture. No malalignment. 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 SOFT TISSUES: No abnormal calcifications. Visualized lung is unremarkable. IMPRESSION: 1. 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 Electronically signed by: Elsie Gravely MD 08/18/2024 08:12 PM EST RP Workstation: [id number]   DG Hand 2 View Left Result Date: 08/18/2024 EXAM: 1 or 2 VIEW(S) XRAY OF THE LEFT HAND 08/18/2024 07:58:00 PM COMPARISON: None available. CLINICAL HISTORY: Pain FINDINGS: BONES AND JOINTS: Prominent degenerative changes in the interphalangeal joints, first carpometacarpal and metacarpophalangeal joints, radiocarpal, radioulnar, and intercarpal joints. Old healed fracture deformity of the distal radius. Subcortical cyst in the base of the 1st metacarpal, likely degenerative cyst. SOFT TISSUES: Soft tissue calcifications in and around the wrist likely representing synovial calcifications. Dorsal soft tissue swelling. Tiny metallic foreign body suggested in the soft tissues of the third finger adjacent to the middle phalanx. IMPRESSION: 1. Dorsal soft tissue swelling. 2. Tiny metallic foreign body  suggested in the soft tissues of the third finger adjacent to the middle phalanx. 3. Prominent degenerative changes in the interphalangeal joints, first carpometacarpal and metacarpophalangeal joints, radiocarpal, radioulnar, and intercarpal joints. 4. Soft tissue calcifications in and around the wrist, likely representing synovial calcifications. 5. Old healed fracture deformity of the distal radius. Electronically signed by: Elsie Gravely MD 08/18/2024 08:11 PM EST RP Workstation: [id number]   DG Chest Portable 1 View Result Date: 08/18/2024 EXAM: 1 VIEW(S) XRAY OF THE CHEST 08/18/2024 04:53:00 PM COMPARISON: 08/10/2024 CLINICAL HISTORY: SOB FINDINGS: LUNGS AND PLEURA: Moderate right pleural effusion, stable compared to prior. Small left pleural effusion. Mild diffuse interstitial prominence. Bibasilar airspace opacities, likely compressive atelectasis. No pneumothorax. HEART AND MEDIASTINUM: Enlarged cardiomediastinal silhouette, unchanged. Median sternotomy and TAVR noted. Atherosclerotic calcifications. BONES AND SOFT TISSUES: No acute osseous abnormality. IMPRESSION: 1. Moderate right pleural effusion and small left pleural effusion. 2. Mild diffuse interstitial prominence and bibasilar airspace opacities, likely compressive atelectasis. Electronically signed by: Greig Pique MD 08/18/2024 05:32 PM EST  RP Workstation: [id number]   DG Chest Port 1 View Result Date: 08/10/2024 EXAM: 1 VIEW(S) XRAY OF THE CHEST 08/10/2024 04:14:18 PM COMPARISON: 08/10/2024 CLINICAL HISTORY: S/P thoracentesis FINDINGS: LINES, TUBES AND DEVICES: TAVR in place. CABG markers noted. LUNGS AND PLEURA: Mild pulmonary edema, unchanged. Stable small to moderate bilateral pleural effusions. Unchanged bibasilar opacities. No pneumothorax. HEART AND MEDIASTINUM: Stable cardiomegaly. Aortic atherosclerosis. BONES AND SOFT TISSUES: Median sternotomy wires noted. No acute osseous abnormality. IMPRESSION: 1. Mild pulmonary edema,  unchanged. 2. Stable small to moderate bilateral pleural effusions. Electronically signed by: Greig Pique MD 08/10/2024 06:13 PM EST RP Workstation: [id number]   US  THORACENTESIS ASP PLEURAL SPACE W/IMG GUIDE Result Date: 08/10/2024 INDICATION: 79 year old male with an extensive cardiac history who presented to the ED today with worsening exertional chest pain and dyspnea on exertion. Workup consistent with heart failure exacerbation and imaging concerning for moderate bilateral pleural effusions with mild pulmonary edema and bibasilar atelectasis. Request for diagnostic and therapeutic thoracentesis. EXAM: ULTRASOUND GUIDED DIAGNOSTIC AND THERAPEUTIC, LEFT-SIDED THORACENTESIS MEDICATIONS: 1% lidocaine , 6 mL. COMPLICATIONS: None immediate. PROCEDURE: An ultrasound guided thoracentesis was thoroughly discussed with the patient and questions answered. The benefits, risks, alternatives and complications were also discussed. The patient understands and wishes to proceed with the procedure. Written consent was obtained. Ultrasound was performed to localize and mark an adequate pocket of fluid in the left chest. The area was then prepped and draped in the normal sterile fashion. 1% Lidocaine  was used for local anesthesia. Under ultrasound guidance a 6 Fr Safe-T-Centesis catheter was introduced. Thoracentesis was performed. The catheter was removed and a dressing applied. FINDINGS: A total of approximately 700 mL of serous sanguinous fluid was removed. Samples were sent to the laboratory as requested by the clinical team. IMPRESSION: Successful ultrasound guided left-sided thoracentesis yielding 700 mL of pleural fluid. Procedure performed by: Sherrilee Bal, PA-C under the supervision of Dr. VEAR Lent Electronically Signed   By: Wilkie Lent M.D.   On: 08/10/2024 16:38   DG Chest 2 View Result Date: 08/10/2024 EXAM: 2 VIEW(S) XRAY OF THE CHEST 08/10/2024 08:36:00 AM COMPARISON: 08/01/2024 CLINICAL  HISTORY: dyspnea, cp FINDINGS: LUNGS AND PLEURA: Moderate bilateral pleural effusions. Mild pulmonary edema. Bibasilar atelectasis/airspace disease. No pneumothorax. HEART AND MEDIASTINUM: Stable CABG markers. TAVR noted. Aortic atherosclerosis. BONES AND SOFT TISSUES: Sternotomy wires noted. No acute osseous abnormality. IMPRESSION: 1. Moderate bilateral pleural effusions with mild pulmonary edema and bibasilar atelectasis/airspace disease. Electronically signed by: Waddell Calk MD 08/10/2024 08:41 AM EST RP Workstation: [id number]   DG Chest 1 View Result Date: 08/01/2024 EXAM: 1 VIEW(S) XRAY OF THE CHEST 08/01/2024 09:32:09 AM COMPARISON: 07/20/2024 CLINICAL HISTORY: SOB (shortness of breath) FINDINGS: LUNGS AND PLEURA: Bibasilar airspace opacities are stable. Mild pulmonary edema is present. Moderate right pleural effusion, slightly decreased. Similar small left pleural effusion. No pneumothorax. HEART AND MEDIASTINUM: Stable cardiomegaly. Aortic atherosclerotic calcification. Aortic valve replacement is noted. BONES AND SOFT TISSUES: Median sternotomy is noted. No acute osseous abnormality. IMPRESSION: 1. Moderate right pleural effusion, slightly decreased, and similar small left pleural effusion. 2. Stable bibasilar airspace opacities and mild pulmonary edema. 3. Stable cardiomegaly and aortic atherosclerotic calcification. 4. Postsurgical changes of median sternotomy and aortic valve replacement. Electronically signed by: Evalene Coho MD 08/01/2024 09:37 AM EST RP Workstation: [id number]    ECHO as above  TELEMETRY (personally reviewed): atrial fibrillation rate 70s  EKG (personally reviewed): atrial fibrillation rate 87 bpm  Data reviewed by me 08/20/2024: last 24h vitals tele labs imaging I/O ED  provider note, admission H&P, hospitalist progress note, palliative notes  Principal Problem:   Failure to thrive in adult Active Problems:   T2DM (type 2 diabetes mellitus) (HCC)   Essential  hypertension   OSA (obstructive sleep apnea)   CAD (coronary artery disease), s/p CABG x 3   Hyperlipidemia associated with type 2 diabetes mellitus (HCC)   Paroxysmal atrial fibrillation (HCC)   Acute kidney injury superimposed on stage 3b chronic kidney disease (HCC)   Moderate mitral regurgitation   Anemia in chronic kidney disease   Acute heart failure with preserved ejection fraction (HCC)   Bacteremia due to methicillin susceptible Staphylococcus aureus (MSSA)   History of transcatheter aortic valve replacement (TAVR)    ASSESSMENT AND PLAN:  GROVE DEFINA is a 79 y.o. 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 Cardiology was consulted for further evaluation.   # Failure to thrive # Acute hypoxic respiratory failure # Acute on chronic HFpEF # AKI on CKD # Recurrent pleural effusions # MSSA bacteremia # Coronary artery disease s/p CABG # Demand ischemia # Aortic stenosis s/p TAVR 01/2024 # Chronic anemia Patient with past medical history as above who has been admitted multiple times recently due to anemia requiring transfusions as well as acute heart failure and recurrent pleural effusions.  He presented this admission with complaints of generalized weakness and shortness of breath worsening over the last week.  BNP elevated at 28,000.  Troponins mildly elevated and flat trending at 167 > 159.  EKG without acute ischemic changes.  Creatinine up from baseline at 2.53 on admission.  He appears quite ill on exam with no significant increase in work of breathing but currently on 5 L nasal cannula. Echo this admission with EF 60-65%, no WMAs, normal diastolic  function, moderate to severe MR.  - Holding further diuresis given continues uptrending of creatinine. - Home GDMT has been held due to renal function. - Blood cultures found to be positive for MSSA. Per ID note they recommend TEE however given his current status he would not be candidate for this. - Suspect minimal and flat troponin elevation most consistent with demand/supply mismatch and not ACS in the setting of above. - Palliative medicine has been consulted to discuss goals of care with patient and son.  Appreciate their assistance.  Patient is critically ill with multiorgan failure and multiple comorbidities. Prognosis is guarded.    This patient's plan of care was discussed and created with Dr. Florencio and he is in agreement.  Signed: Danita Bloch, PA-C  08/20/2024, 9:44 AM [facility]      "

## 2024-08-20 NOTE — Progress Notes (Signed)
 " PROGRESS NOTE    [name]  [name] DOB: 23-Oct-1945 DOA: 08/18/2024 PCP: [name], MD  Subjective: No acute events overnight. Seen and examined at bedside with son present. Reports feeling slightly better with improvement in shortness of breath. Denies nausea, vomiting, constipation.    Hospital Course:  79 y.o. year old male with medical history of hypertension, hyperlipidemia complicated by CAD status post CABG, type 2 diabetes, CHF (EF 60-65%) and 02/2024, CKD 3B, and severe pulmonary hypertension presenting to the ED with increasing weakness, and dyspnea. Patient with multiple recent admissions with the most recent admission from 08/10/2024 - 08/15/2024 for acute heart failure exacerbation. States he has been weak and having difficulty with ambulation. He is also having pain in his hand along with his shoulders. He denies any URI symptoms but states his breathing has worsened since leaving the hospital. Denies any fevers or chills. On arrival to the ED patient was noted to be HDS stable. Lab work and imaging obtained. CBC with moderate leukocytosis at 16.9, hemoglobin at baseline at 8.6, thrombocytopenia that is lower than recent platelet count. CMP with moderate hyperglycemia, AKI on CKD 3B, hypercalcemia at 11.2, elevated ALP and bilirubin level. Respiratory panel negative for COVID, flu, RSV. Troponin mildly elevated but downtrending. proBNP significantly elevated from 8 days ago and has doubled since then going from 15k to 28k. Chest x-ray with diffuse interstitial prominence concerning for edema along with moderate right pleural effusion and small left pleural effusion. [facility] contacted for admission.    Assessment and Plan:  Recurrent acute hypoxic respiratory failure Acute CHFpEF exacerbation  Aortic stenosis status post TAVR - coming in with worsening dyspnea and volume overload.   - Repeat echo showed LVEF 60-65%, no WMAs, normal diastolic function, moderate to  severe MR.   - Stop IV Furosemide  60 mg BID given AKI  - hold home GDMT due to AKI - Trend BMP, follow mag (goal K>4 and Mag>2) - Strict I&Os - Daily Weights - monitor on tele - Cardiology following  - Palliative following    AKI on CKD 3B:  - Likely in the setting of venous congestion.   - Cr 3.05 < 2.66, baseline Cr unclear maybe 1.6-1.9 - stop IV Lasix  and other GDMT .   - Trend creatinine, renally dose medications and avoid nephrotoxic agents.  - nephrology consulted, spoke with service, awaiting recs   MSSA bacteremia Left hand cellulitis - DDx includes cellulitis versus gout  - L hand xray with no acute pathology, superficial foreign body in soft tissues of L middle finger anterior to middle phalanx - LUE venous US  negative for acute DVT - Bcx 1/6 showed MSSA - Cont cefazolin  -Repeat blood cultures pending  - Repeat echo showed LVEF 60-65%, no WMAs, normal diastolic function, moderate to severe MR.   - ID following  - Orthopedic surgery following    L Shoulder Pain Osteoarthritis - low likelihood for septic joint as per orthopedic surgery - pt with left shoulder pain with ROM.  - Xray L shoulder with OA. - cont tylenol  PRN - cont IV dilaudid  q4h PRN - started robaxin  q6h for muscle stiffness as per palliative recs    Elevated LFTs:  May be secondary to hepatic congestion in setting of volume overload.  - IV diuresis on hold given AKI - Will trend.   Macrocytic anemia:  - Hgb 7.2 < 8.5, baseline 7-8  - Cardiology recommends hemoglobin goal greater than 8 and currently it is at 8.6.   -  folic acid  and B12 not low - will give 1 unit pRBC on 1/8 - trend CBC   Hypertension:  - Hold home coreg  , amlodipine     Hyperlipidemia: Holding home statin given elevated LFTs.   Paroxysmal atrial fibrillation:  - In sinus rhythm.   - Will hold home ASA given AKI .   - hold home Coreg   - cardiology following   Type 2 diabetes mellitus:  - Holding home Jardiance  given  AKI - cont on SSI here.   Generalized weakness: PT and OT following  DVT prophylaxis: Place and maintain sequential compression device Start: 08/20/24 1343  SCDs   Code Status: Full Code Family Communication: updated son at bedside Disposition Plan: TBD Reason for continuing need for hospitalization: severity of illness  Objective: Vitals:   08/20/24 0500 08/20/24 0915 08/20/24 1117 08/20/24 1135  BP:  (!) 114/51 102/60 (!) 111/56  Pulse:  77 69 74  Resp:  20 18 18   Temp:  97.6 F (36.4 C) (!) 97.4 F (36.3 C) (!) 97.5 F (36.4 C)  TempSrc:   Oral Oral  SpO2:  100% 100% 100%  Weight: 85.6 kg     Height:        Intake/Output Summary (Last 24 hours) at 08/20/2024 1333 Last data filed at 08/20/2024 1020 Gross per 24 hour  Intake 220 ml  Output 250 ml  Net -30 ml   Filed Weights   08/18/24 1850 08/18/24 2037 08/20/24 0500  Weight: 81 kg 86.2 kg 85.6 kg    Examination:  Physical Exam Vitals and nursing note reviewed.  Constitutional:      General: He is not in acute distress.    Appearance: He is ill-appearing.     Comments: Weak, frail  HENT:     Head: Normocephalic and atraumatic.  Cardiovascular:     Rate and Rhythm: Normal rate. Rhythm irregular.     Pulses: Normal pulses.     Heart sounds: Normal heart sounds.  Pulmonary:     Effort: Pulmonary effort is normal. No respiratory distress.     Breath sounds: Normal breath sounds. No wheezing.  Abdominal:     General: Bowel sounds are normal.     Palpations: Abdomen is soft.  Musculoskeletal:     Right lower leg: Edema present.     Left lower leg: Edema present.  Neurological:     Mental Status: He is alert. Mental status is at baseline.     Data Reviewed: I have personally reviewed following labs and imaging studies  CBC: Recent Labs  Lab 08/13/24 1417 08/15/24 0427 08/18/24 1641 08/19/24 0425 08/20/24 0420  WBC  --  11.3* 16.9* 14.8* 11.2*  NEUTROABS  --   --  15.7*  --   --   HGB 8.0* 8.1*  8.6* 8.5* 7.2*  HCT 24.6* 25.6* 27.4* 27.1* 22.9*  MCV  --  101.2* 100.0 100.4* 98.7  PLT  --  112* 86* 71* 78*   Basic Metabolic Panel: Recent Labs  Lab 08/14/24 0448 08/15/24 0427 08/18/24 1641 08/19/24 0425 08/20/24 0420  NA 139 139 136 136 135  K 4.6 3.9 4.9 4.8 4.8  CL 99 99 94* 94* 93*  CO2 34* 34* 30 32 30  GLUCOSE 168* 153* 203* 211* 206*  BUN 46* 46* 69* 76* 88*  CREATININE 1.95* 1.87* 2.53* 2.66* 3.05*  CALCIUM  10.1 10.3 11.2* 10.4* 9.9  MG 2.1  --  2.0  --   --   PHOS 3.3  --   --   --   --  GFR: Estimated Creatinine Clearance: 23.3 mL/min (A) (by C-G formula based on SCr of 3.05 mg/dL (H)). Liver Function Tests: Recent Labs  Lab 08/14/24 0448 08/18/24 1641 08/19/24 0425  AST  --  31 23  ALT  --  18 15  ALKPHOS  --  134* 115  BILITOT  --  2.5* 1.9*  PROT  --  7.4 6.6  ALBUMIN 3.5 3.4* 3.0*   No results for input(s): LIPASE, AMYLASE in the last 168 hours. No results for input(s): AMMONIA in the last 168 hours. Coagulation Profile: No results for input(s): INR, PROTIME in the last 168 hours. Cardiac Enzymes: No results for input(s): CKTOTAL, CKMB, CKMBINDEX, TROPONINI in the last 168 hours. ProBNP, BNP (last 5 results) Recent Labs    08/22/23 0730 10/16/23 1415 03/02/24 1314 07/01/24 1220 07/18/24 1707 08/10/24 0732 08/18/24 1641  PROBNP  --   --   --  12,167.0* 13,853.0* 15,340.0* 28,444.0*  BNP 1,069.2* 942.6* 1,183.2*  --   --   --   --    HbA1C: No results for input(s): HGBA1C in the last 72 hours. CBG: Recent Labs  Lab 08/19/24 1116 08/19/24 1706 08/19/24 2234 08/20/24 0916 08/20/24 1253  GLUCAP 233* 147* 171* 287* 183*   Lipid Profile: No results for input(s): CHOL, HDL, LDLCALC, TRIG, CHOLHDL, LDLDIRECT in the last 72 hours. Thyroid  Function Tests: No results for input(s): TSH, T4TOTAL, FREET4, T3FREE, THYROIDAB in the last 72 hours. Anemia Panel: Recent Labs    08/18/24 2210   VITAMINB12 1,236*  FOLATE 12.3   Sepsis Labs: No results for input(s): PROCALCITON, LATICACIDVEN in the last 168 hours.  Recent Results (from the past 240 hours)  Resp panel by RT-PCR (RSV, Flu A&B, Covid) Anterior Nasal Swab     Status: None   Collection Time: 08/18/24  4:41 PM   Specimen: Anterior Nasal Swab  Result Value Ref Range Status   SARS Coronavirus 2 by RT PCR NEGATIVE NEGATIVE Final    Comment: (NOTE) SARS-CoV-2 target nucleic acids are NOT DETECTED.  The SARS-CoV-2 RNA is generally detectable in upper respiratory specimens during the acute phase of infection. The lowest concentration of SARS-CoV-2 viral copies this assay can detect is 138 copies/mL. A negative result does not preclude SARS-Cov-2 infection and should not be used as the sole basis for treatment or other patient management decisions. A negative result may occur with  improper specimen collection/handling, submission of specimen other than nasopharyngeal swab, presence of viral mutation(s) within the areas targeted by this assay, and inadequate number of viral copies(<138 copies/mL). A negative result must be combined with clinical observations, patient history, and epidemiological information. The expected result is Negative.  Fact Sheet for Patients:  bloggercourse.com  Fact Sheet for Healthcare Providers:  seriousbroker.it  This test is no t yet approved or cleared by the [place]  FDA and  has been authorized for detection and/or diagnosis of SARS-CoV-2 by FDA under an Emergency Use Authorization (EUA). This EUA will remain  in effect (meaning this test can be used) for the duration of the COVID-19 declaration under Section 564(b)(1) of the Act, 21 U.S.C.section 360bbb-3(b)(1), unless the authorization is terminated  or revoked sooner.       Influenza A by PCR NEGATIVE NEGATIVE Final   Influenza B by PCR NEGATIVE NEGATIVE Final     Comment: (NOTE) The Xpert Xpress SARS-CoV-2/FLU/RSV plus assay is intended as an aid in the diagnosis of influenza from Nasopharyngeal swab specimens and should not be used as a sole  basis for treatment. Nasal washings and aspirates are unacceptable for Xpert Xpress SARS-CoV-2/FLU/RSV testing.  Fact Sheet for Patients: bloggercourse.com  Fact Sheet for Healthcare Providers: seriousbroker.it  This test is not yet approved or cleared by the [place]  FDA and has been authorized for detection and/or diagnosis of SARS-CoV-2 by FDA under an Emergency Use Authorization (EUA). This EUA will remain in effect (meaning this test can be used) for the duration of the COVID-19 declaration under Section 564(b)(1) of the Act, 21 U.S.C. section 360bbb-3(b)(1), unless the authorization is terminated or revoked.     Resp Syncytial Virus by PCR NEGATIVE NEGATIVE Final    Comment: (NOTE) Fact Sheet for Patients: bloggercourse.com  Fact Sheet for Healthcare Providers: seriousbroker.it  This test is not yet approved or cleared by the [place]  FDA and has been authorized for detection and/or diagnosis of SARS-CoV-2 by FDA under an Emergency Use Authorization (EUA). This EUA will remain in effect (meaning this test can be used) for the duration of the COVID-19 declaration under Section 564(b)(1) of the Act, 21 U.S.C. section 360bbb-3(b)(1), unless the authorization is terminated or revoked.  Performed at [facility], [address] [place], [place] [address]   Culture, blood (Routine X 2) w Reflex to ID Panel     Status: Abnormal (Preliminary result)   Collection Time: 08/18/24 10:33 PM   Specimen: BLOOD  Result Value Ref Range Status   Specimen Description   Final    BLOOD RIGHT ANTECUBITAL Performed at [facility], [address], [place], [place] [address]     Special Requests   Final    BOTTLES DRAWN AEROBIC AND ANAEROBIC Blood Culture adequate volume Performed at [facility], [address] [place], [place] [address]    Culture  Setup Time   Final    GRAM POSITIVE COCCI IN BOTH AEROBIC AND ANAEROBIC BOTTLES CRITICAL RESULT CALLED TO, READ BACK BY AND VERIFIED WITH: ESTILL HALLAJI 08/19/24 0915 MW    Culture (A)  Final    STAPHYLOCOCCUS AUREUS SUSCEPTIBILITIES TO FOLLOW Performed at [facility], [address] [place], [place] [address]    Report Status PENDING  Incomplete  Blood Culture ID Panel (Reflexed)     Status: Abnormal   Collection Time: 08/18/24 10:33 PM  Result Value Ref Range Status   Enterococcus faecalis NOT DETECTED NOT DETECTED Final   Enterococcus Faecium NOT DETECTED NOT DETECTED Final   Listeria monocytogenes NOT DETECTED NOT DETECTED Final   Staphylococcus species DETECTED (A) NOT DETECTED Final    Comment: CRITICAL RESULT CALLED TO, READ BACK BY AND VERIFIED WITH: SHEEMA HALLAJI 08/19/24 0915 MW    Staphylococcus aureus (BCID) DETECTED (A) NOT DETECTED Final    Comment: CRITICAL RESULT CALLED TO, READ BACK BY AND VERIFIED WITH: SHEEMA HALLAJI 08/19/24 0915 MW    Staphylococcus epidermidis NOT DETECTED NOT DETECTED Final   Staphylococcus lugdunensis NOT DETECTED NOT DETECTED Final   Streptococcus species NOT DETECTED NOT DETECTED Final   Streptococcus agalactiae NOT DETECTED NOT DETECTED Final   Streptococcus pneumoniae NOT DETECTED NOT DETECTED Final   Streptococcus pyogenes NOT DETECTED NOT DETECTED Final   A.calcoaceticus-baumannii NOT DETECTED NOT DETECTED Final   Bacteroides fragilis NOT DETECTED NOT DETECTED Final   Enterobacterales NOT DETECTED NOT DETECTED Final   Enterobacter cloacae complex NOT DETECTED NOT DETECTED Final   Escherichia coli NOT DETECTED NOT DETECTED Final   Klebsiella aerogenes NOT DETECTED NOT DETECTED Final   Klebsiella oxytoca NOT DETECTED NOT DETECTED Final  Klebsiella pneumoniae NOT DETECTED NOT DETECTED Final   Proteus species NOT DETECTED NOT DETECTED Final   Salmonella species NOT DETECTED NOT DETECTED Final   Serratia marcescens NOT DETECTED NOT DETECTED Final   Haemophilus influenzae NOT DETECTED NOT DETECTED Final   Neisseria meningitidis NOT DETECTED NOT DETECTED Final   Pseudomonas aeruginosa NOT DETECTED NOT DETECTED Final   Stenotrophomonas maltophilia NOT DETECTED NOT DETECTED Final   Candida albicans NOT DETECTED NOT DETECTED Final   Candida auris NOT DETECTED NOT DETECTED Final   Candida glabrata NOT DETECTED NOT DETECTED Final   Candida krusei NOT DETECTED NOT DETECTED Final   Candida parapsilosis NOT DETECTED NOT DETECTED Final   Candida tropicalis NOT DETECTED NOT DETECTED Final   Cryptococcus neoformans/gattii NOT DETECTED NOT DETECTED Final   Meth resistant mecA/C and MREJ NOT DETECTED NOT DETECTED Final    Comment: Performed at [facility], [address], [place], [place] [address]     Radiology Studies: ECHOCARDIOGRAM COMPLETE Result Date: 08/19/2024    ECHOCARDIOGRAM REPORT   Patient Name:   RULON ABDALLA [facility] Date of Exam: 08/18/2024 Medical Rec #:  [id number]        Height:       73.0 in Accession #:    [id number]       Weight:       190.0 lb Date of Birth:  11-21-1945         BSA:          2.105 m Patient Age:    78 years         BP:           124/76 mmHg Patient Gender: M                HR:           115 bpm. Exam Location:  [facility] Procedure: 2D Echo, Cardiac Doppler and Color Doppler (Both Spectral and Color            Flow Doppler were utilized during procedure). Indications:     I50.21 Acute Systolic CHF  History:         Patient has prior history of Echocardiogram examinations, most                  recent 03/03/2024. CHF, CAD, Prior CABG and TAVR-01/23/24,                  Arrythmias:LBBB, RBBB and Atrial Fibrillation,                  Signs/Symptoms:Murmur and Dyspnea; Risk Factors:Hypertension,                   Diabetes and Dyslipidemia.  Sonographer:     Carl Coma RDCS Referring Phys:  [id number] MORENE BATHE Diagnosing Phys: Dwayne D Callwood MD IMPRESSIONS  1. Left ventricular ejection fraction, by estimation, is 60 to 65%. The left ventricle has normal function. The left ventricle has no regional wall motion abnormalities. There is severe concentric left ventricular hypertrophy. Left ventricular diastolic  parameters are consistent with Grade III diastolic dysfunction (restrictive). There is the interventricular septum is flattened in diastole ('D' shaped left ventricle), consistent with right ventricular volume overload.  2. Right ventricular systolic function is moderately reduced. The right ventricular size is moderately enlarged. Mildly increased right ventricular wall thickness.  3. Left atrial size was severely dilated.  4. Right atrial size was moderately dilated.  5. The mitral valve is grossly normal.  Moderate mitral valve regurgitation. Moderate mitral annular calcification.  6. Tricuspid valve regurgitation is moderate to severe.  7. The aortic valve is grossly normal. Aortic valve regurgitation is mild. Aortic valve sclerosis/calcification is present, without any evidence of aortic stenosis. 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 FINDINGS  Left Ventricle: Left ventricular ejection fraction, by estimation, is 60 to 65%. The left ventricle has normal function. The left ventricle has no regional wall motion abnormalities. Strain was performed and the global longitudinal strain is indeterminate. The left ventricular internal cavity size was normal in size. There is severe concentric left ventricular hypertrophy. The interventricular septum is flattened in diastole ('D' shaped left ventricle), consistent with right ventricular volume overload. Left ventricular diastolic parameters are consistent with Grade III diastolic dysfunction (restrictive).  Right Ventricle: The right ventricular size is moderately enlarged. Mildly increased right ventricular wall thickness. Right ventricular systolic function is moderately reduced. Left Atrium: Left atrial size was severely dilated. Right Atrium: Right atrial size was moderately dilated. Pericardium: There is no evidence of pericardial effusion. Mitral Valve: The mitral valve is grossly normal. There is moderate thickening of the mitral valve leaflet(s). There is moderate calcification of the mitral valve leaflet(s). Moderate mitral annular calcification. Moderate mitral valve regurgitation. Tricuspid Valve: The tricuspid valve is grossly normal. Tricuspid valve regurgitation is moderate to severe. The aortic valve is grossly normal. Aortic valve regurgitation is mild. Aortic valve sclerosis/calcification is present, without any evidence of aortic stenosis. 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 Pulmonic Valve: The pulmonic valve was normal in structure. Pulmonic valve regurgitation is not visualized. Aorta: The aortic root was not well visualized. IAS/Shunts: No atrial level shunt detected by color flow Doppler. Additional Comments: 3D was performed not requiring image post processing on an independent workstation and was indeterminate.  LEFT VENTRICLE PLAX 2D LVIDd:         4.50 cm LVIDs:         3.20 cm LV PW:         1.80 cm LV IVS:        1.80 cm  RIGHT VENTRICLE            IVC RV Basal diam:  4.90 cm    IVC diam: 2.10 cm RV S prime:     6.74 cm/s TAPSE (M-mode): 1.2 cm LEFT ATRIUM             Index        RIGHT ATRIUM           Index LA diam:        5.30 cm 2.52 cm/m   RA Area:     28.30 cm LA Vol (A2C):   94.4 ml 44.84 ml/m  RA Volume:   102.00 ml 48.45 ml/m LA Vol (A4C):   92.0 ml 43.70 ml/m LA Biplane Vol: 97.0 ml 46.08 ml/m  AORTIC VALVE AV Vmax:           302.00 cm/s AV Vmean:           212.200 cm/s AV VTI:            0.447 m AV Peak Grad:      36.5 mmHg AV Mean Grad:      20.6 mmHg LVOT Vmax:         81.03 cm/s LVOT Vmean:  52.067 cm/s LVOT VTI:          0.135 m LVOT/AV VTI ratio: 0.30 AI PHT:            277 msec  AORTA Ao Root diam: 3.40 cm Ao Asc diam:  3.90 cm MR Peak grad: 112.8 mmHg    TRICUSPID VALVE MR Vmax:      531.00 cm/s   TR Peak grad:   58.7 mmHg MV E velocity: 163.25 cm/s  TR Vmax:        383.00 cm/s                              SHUNTS                             Systemic VTI: 0.14 m Cara JONETTA Lovelace MD Electronically signed by Cara JONETTA Lovelace MD Signature Date/Time: 08/19/2024/12:52:25 PM    Final    DG Hand Complete Left Result Date: 08/19/2024 EXAM: 3 OR MORE VIEW(S) XRAY OF THE LEFT HAND 08/19/2024 12:49:00 AM COMPARISON: 08/18/2024. CLINICAL HISTORY: Swelling. FINDINGS: BONES AND JOINTS: No acute fracture, subluxation or dislocation. No malalignment. Calcifications about the left wrist, likely synovial calcifications. Arthritic changes throughout the left wrist. SOFT TISSUES: Small radiopaque foreign body noted in the soft tissues of the left middle finger anterior to the middle phalanx. IMPRESSION: 1. Small radiopaque foreign body in the soft tissues of the left middle finger anterior to the middle phalanx. 2. No acute fracture, subluxation, or dislocation. Electronically signed by: Franky Crease MD 08/19/2024 01:00 AM EST RP Workstation: [id number]   US  Venous Img Upper Uni Left (DVT) Result Date: 08/19/2024 CLINICAL DATA:  Swelling EXAM: Left UPPER EXTREMITY VENOUS DOPPLER ULTRASOUND TECHNIQUE: Gray-scale sonography with graded compression, as well as color Doppler and duplex ultrasound were performed to evaluate the upper extremity deep venous system from the level of the subclavian vein and including the jugular, axillary, basilic, radial, ulnar and upper cephalic vein. Spectral Doppler was utilized to evaluate flow at rest and with distal augmentation maneuvers.  COMPARISON:  None Available. FINDINGS: Contralateral Subclavian Vein: Respiratory phasicity is normal and symmetric with the symptomatic side. No evidence of thrombus. Internal Jugular Vein: No evidence of thrombus. Normal compressibility, respiratory phasicity and response to augmentation. Subclavian Vein: No evidence of thrombus. Normal respiratory phasicity and response to augmentation. Axillary Vein: No evidence of thrombus. Normal compressibility, respiratory phasicity and response to augmentation. Cephalic Vein: No evidence of thrombus. Normal compressibility, respiratory phasicity and response to augmentation. Basilic Vein: No evidence of thrombus. Normal compressibility, respiratory phasicity and response to augmentation. Brachial Veins: No evidence of thrombus. Normal compressibility, respiratory phasicity and response to augmentation. Radial Veins: No evidence of thrombus. Normal compressibility, respiratory phasicity and response to augmentation. Ulnar Veins: No evidence of thrombus. Normal compressibility, respiratory phasicity and response to augmentation. IMPRESSION: No evidence of DVT within the left upper extremity. Electronically Signed   By: Luke Bun M.D.   On: 08/19/2024 00:31   DG Shoulder Left Result Date: 08/18/2024 EXAM: 1 VIEW(S) XRAY OF THE LEFT SHOULDER 08/18/2024 07:58:00 PM COMPARISON: None available. CLINICAL HISTORY: Shoulder pain, left. FINDINGS: BONES AND JOINTS: Glenohumeral joint is normally aligned. No acute fracture. No malalignment. 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 SOFT TISSUES: No abnormal calcifications. Visualized lung is unremarkable. IMPRESSION: 1. 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 Electronically signed by: Elsie Gravely  MD 08/18/2024 08:12 PM EST RP Workstation: [id number]   DG Hand 2 View Left Result Date: 08/18/2024 EXAM: 1 or 2 VIEW(S) XRAY OF THE LEFT HAND 08/18/2024 07:58:00 PM  COMPARISON: None available. CLINICAL HISTORY: Pain FINDINGS: BONES AND JOINTS: Prominent degenerative changes in the interphalangeal joints, first carpometacarpal and metacarpophalangeal joints, radiocarpal, radioulnar, and intercarpal joints. Old healed fracture deformity of the distal radius. Subcortical cyst in the base of the 1st metacarpal, likely degenerative cyst. SOFT TISSUES: Soft tissue calcifications in and around the wrist likely representing synovial calcifications. Dorsal soft tissue swelling. Tiny metallic foreign body suggested in the soft tissues of the third finger adjacent to the middle phalanx. IMPRESSION: 1. Dorsal soft tissue swelling. 2. Tiny metallic foreign body suggested in the soft tissues of the third finger adjacent to the middle phalanx. 3. Prominent degenerative changes in the interphalangeal joints, first carpometacarpal and metacarpophalangeal joints, radiocarpal, radioulnar, and intercarpal joints. 4. Soft tissue calcifications in and around the wrist, likely representing synovial calcifications. 5. Old healed fracture deformity of the distal radius. Electronically signed by: Elsie Gravely MD 08/18/2024 08:11 PM EST RP Workstation: [id number]   DG Chest Portable 1 View Result Date: 08/18/2024 EXAM: 1 VIEW(S) XRAY OF THE CHEST 08/18/2024 04:53:00 PM COMPARISON: 08/10/2024 CLINICAL HISTORY: SOB FINDINGS: LUNGS AND PLEURA: Moderate right pleural effusion, stable compared to prior. Small left pleural effusion. Mild diffuse interstitial prominence. Bibasilar airspace opacities, likely compressive atelectasis. No pneumothorax. HEART AND MEDIASTINUM: Enlarged cardiomediastinal silhouette, unchanged. Median sternotomy and TAVR noted. Atherosclerotic calcifications. BONES AND SOFT TISSUES: No acute osseous abnormality. IMPRESSION: 1. Moderate right pleural effusion and small left pleural effusion. 2. Mild diffuse interstitial prominence and bibasilar airspace opacities, likely  compressive atelectasis. Electronically signed by: Greig Pique MD 08/18/2024 05:32 PM EST RP Workstation: [id number]    Scheduled Meds:  acetaminophen   650 mg Oral TID   Or   acetaminophen   650 mg Rectal TID   ferrous sulfate   325 mg Oral Daily   Gerhardt's butt cream   Topical TID   heparin   5,000 Units Subcutaneous Q8H   insulin  aspart  0-15 Units Subcutaneous TID WC   insulin  aspart  0-5 Units Subcutaneous QHS   methocarbamol   500 mg Oral Q6H   pantoprazole   40 mg Oral Daily   sodium chloride  flush  3 mL Intravenous Q12H   Continuous Infusions:  sodium chloride       ceFAZolin  (ANCEF ) IV 2 g (08/20/24 1015)     LOS: 2 days   Norval Bar, MD  [facility]  08/20/2024, 1:33 PM   "

## 2024-08-20 NOTE — Inpatient Diabetes Management (Signed)
 Inpatient Diabetes Program Recommendations  AACE/ADA: New Consensus Statement on Inpatient Glycemic Control   Target Ranges:  Prepandial:   less than 140 mg/dL      Peak postprandial:   less than 180 mg/dL (1-2 hours)      Critically ill patients:  140 - 180 mg/dL    Latest Reference Range & Units 08/19/24 00:26 08/19/24 07:23 08/19/24 08:57 08/19/24 11:16 08/19/24 17:06 08/19/24 22:34 08/20/24 09:16  Glucose-Capillary 70 - 99 mg/dL 696 (H) 734 (H) 738 (H) 233 (H) 147 (H) 171 (H) 287 (H)   Review of Glycemic Control  Diabetes history: DM2 Outpatient Diabetes medications: Jardiance  10 mg daily Current orders for Inpatient glycemic control: Novolog  0-15 units TID with meals, Novolog  0-5 units QHS  Inpatient Diabetes Program Recommendations:    Insulin : Please consider ordering insulin  glargine 5 units Q24H (to start now).  Thanks, Earnie Gainer, RN, MSN, CDCES Diabetes Coordinator Inpatient Diabetes Program [phone] (Team Pager from 8am to 5pm)

## 2024-08-20 NOTE — Consult Note (Signed)
 WOC Nurse Consult Note: Reason for Consult: sacral wound  Wound type: 1. Stage 1 Pressure injury sacrum  2.  Stage 3 Pressure Injury R buttock 80% pink moist 20% tan  Pressure Injury POA: Yes Measurement: see nursing flowsheet  Wound bed: as above  Drainage (amount, consistency, odor) see nursing flowsheet  Periwound: medial buttocks with moisture associated skin damage  Dressing procedure/placement/frequency: Cleanse R buttock wound with NS, apply a small piece of silver hydrofiber Soila [phone]) cut to fit wound bed daily and secure with silicone foam.   Will write for Gerhardt's Butt Cream 3 times daily to sacrum/medial buttocks.   POC discussed with bedside nurse. WOC team will not follow. Reconsult if further needs arise.   Thank you,    Powell Bar MSN, RN-BC, [employer]

## 2024-08-20 NOTE — Progress Notes (Signed)
 " [facility]  ROUNDING NOTE   Subjective:   [name] is a 79 year old male with past medical conditions including hypertension, CAD status post CABG, type 2 diabetes, hypertension, severe pulmonary hypertension, and chronic kidney disease stage IIIb.  Patient presents to the emergency department with complaints of lower extremity edema and has been admitted for Failure to thrive in adult [R62.7] Fall in home, initial encounter [W19.XXXA, Y92.009] 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] Acute on chronic congestive heart failure, unspecified heart failure type [facility]) [I50.9]  Patient is known to our practice and is followed outpatient by Dr.Korrapati.  He states he has lower extremity edema at baseline.  Reports sitting in a chair and when he tried to stand, his knees went weak and he slipped out of chair.  Son seated at bedside.  Lower extremity edema remains however son states is improved from baseline.  Patient currently on 5 L nasal cannula, appears tachypneic during visit.  Creatinine on ED arrival 2.53, GFR 26, BUN 69.  Creatinine has continually rose during this admission, currently 3.05 with a GFR of 20.  Baseline appears to be 2.12 with GFR 32 in September.  Chest x-ray shows moderate right pleural effusion with small left pleural effusion.  Imaging negative for DVT in upper extremity.  Current echo shows EF 60 to 65% with a grade 3 diastolic dysfunction.  Left and right atrium shows some dilation.  We have been consulted to assist in management of acute kidney injury.   Objective:  Vital signs in last 24 hours:  Temp:  [97.4 F (36.3 C)-101.8 F (38.8 C)] 97.6 F (36.4 C) (01/08 1440) Pulse Rate:  [66-81] 66 (01/08 1440) Resp:  [15-22] 18 (01/08 1440) BP: (102-118)/(50-63) 103/61 (01/08 1440) SpO2:  [95 %-100 %] 100 % (01/08 1440) Weight:  [85.6 kg] 85.6 kg (01/08 0500)  Weight change: 4.6 kg Filed Weights   08/18/24 1850 08/18/24 2037 08/20/24 0500   Weight: 81 kg 86.2 kg 85.6 kg    Intake/Output: I/O last 3 completed shifts: In: 103 [P.O.:100; I.V.:3] Out: 250 [Urine:250]   Intake/Output this shift:  Total I/O In: 506 [P.O.:120; Blood:386] Out: -   Physical Exam: General: NAD  Head: Normocephalic, atraumatic.  Dry oral mucosal membranes  Eyes: Anicteric  Lungs:  Wheeze throughout, Pulpotio Bareas O2  Heart: Regular rate and rhythm  Abdomen:  Soft, nontender  Extremities: 2+ peripheral edema.  Neurologic: Awake, alert, conversant  Skin: Warm,dry, no rash       Basic Metabolic Panel: Recent Labs  Lab 08/14/24 0448 08/15/24 0427 08/18/24 1641 08/19/24 0425 08/20/24 0420  NA 139 139 136 136 135  K 4.6 3.9 4.9 4.8 4.8  CL 99 99 94* 94* 93*  CO2 34* 34* 30 32 30  GLUCOSE 168* 153* 203* 211* 206*  BUN 46* 46* 69* 76* 88*  CREATININE 1.95* 1.87* 2.53* 2.66* 3.05*  CALCIUM  10.1 10.3 11.2* 10.4* 9.9  MG 2.1  --  2.0  --   --   PHOS 3.3  --   --   --   --     Liver Function Tests: Recent Labs  Lab 08/14/24 0448 08/18/24 1641 08/19/24 0425  AST  --  31 23  ALT  --  18 15  ALKPHOS  --  134* 115  BILITOT  --  2.5* 1.9*  PROT  --  7.4 6.6  ALBUMIN 3.5 3.4* 3.0*   No results for input(s): LIPASE, AMYLASE in the last 168 hours.  No results for input(s): AMMONIA in the last 168 hours.  CBC: Recent Labs  Lab 08/15/24 0427 08/18/24 1641 08/19/24 0425 08/20/24 0420  WBC 11.3* 16.9* 14.8* 11.2*  NEUTROABS  --  15.7*  --   --   HGB 8.1* 8.6* 8.5* 7.2*  HCT 25.6* 27.4* 27.1* 22.9*  MCV 101.2* 100.0 100.4* 98.7  PLT 112* 86* 71* 78*    Cardiac Enzymes: No results for input(s): CKTOTAL, CKMB, CKMBINDEX, TROPONINI in the last 168 hours.  BNP: Invalid input(s): POCBNP  CBG: Recent Labs  Lab 08/19/24 1116 08/19/24 1706 08/19/24 2234 08/20/24 0916 08/20/24 1253  GLUCAP 233* 147* 171* 287* 183*    Microbiology: Results for orders placed or performed during the hospital encounter of 08/18/24   Resp panel by RT-PCR (RSV, Flu A&B, Covid) Anterior Nasal Swab     Status: None   Collection Time: 08/18/24  4:41 PM   Specimen: Anterior Nasal Swab  Result Value Ref Range Status   SARS Coronavirus 2 by RT PCR NEGATIVE NEGATIVE Final    Comment: (NOTE) SARS-CoV-2 target nucleic acids are NOT DETECTED.  The SARS-CoV-2 RNA is generally detectable in upper respiratory specimens during the acute phase of infection. The lowest concentration of SARS-CoV-2 viral copies this assay can detect is 138 copies/mL. A negative result does not preclude SARS-Cov-2 infection and should not be used as the sole basis for treatment or other patient management decisions. A negative result may occur with  improper specimen collection/handling, submission of specimen other than nasopharyngeal swab, presence of viral mutation(s) within the areas targeted by this assay, and inadequate number of viral copies(<138 copies/mL). A negative result must be combined with clinical observations, patient history, and epidemiological information. The expected result is Negative.  Fact Sheet for Patients:  bloggercourse.com  Fact Sheet for Healthcare Providers:  seriousbroker.it  This test is no t yet approved or cleared by the [place]  FDA and  has been authorized for detection and/or diagnosis of SARS-CoV-2 by FDA under an Emergency Use Authorization (EUA). This EUA will remain  in effect (meaning this test can be used) for the duration of the COVID-19 declaration under Section 564(b)(1) of the Act, 21 U.S.C.section 360bbb-3(b)(1), unless the authorization is terminated  or revoked sooner.       Influenza A by PCR NEGATIVE NEGATIVE Final   Influenza B by PCR NEGATIVE NEGATIVE Final    Comment: (NOTE) The Xpert Xpress SARS-CoV-2/FLU/RSV plus assay is intended as an aid in the diagnosis of influenza from Nasopharyngeal swab specimens and should not be used  as a sole basis for treatment. Nasal washings and aspirates are unacceptable for Xpert Xpress SARS-CoV-2/FLU/RSV testing.  Fact Sheet for Patients: bloggercourse.com  Fact Sheet for Healthcare Providers: seriousbroker.it  This test is not yet approved or cleared by the [place]  FDA and has been authorized for detection and/or diagnosis of SARS-CoV-2 by FDA under an Emergency Use Authorization (EUA). This EUA will remain in effect (meaning this test can be used) for the duration of the COVID-19 declaration under Section 564(b)(1) of the Act, 21 U.S.C. section 360bbb-3(b)(1), unless the authorization is terminated or revoked.     Resp Syncytial Virus by PCR NEGATIVE NEGATIVE Final    Comment: (NOTE) Fact Sheet for Patients: bloggercourse.com  Fact Sheet for Healthcare Providers: seriousbroker.it  This test is not yet approved or cleared by the [place]  FDA and has been authorized for detection and/or diagnosis of SARS-CoV-2 by FDA under an Emergency Use Authorization (EUA). This EUA  will remain in effect (meaning this test can be used) for the duration of the COVID-19 declaration under Section 564(b)(1) of the Act, 21 U.S.C. section 360bbb-3(b)(1), unless the authorization is terminated or revoked.  Performed at [facility], [address] [place], [place] [address]   Culture, blood (Routine X 2) w Reflex to ID Panel     Status: Abnormal (Preliminary result)   Collection Time: 08/18/24 10:33 PM   Specimen: BLOOD  Result Value Ref Range Status   Specimen Description   Final    BLOOD RIGHT ANTECUBITAL Performed at [facility], [address] [place], [place] [address]    Special Requests   Final    BOTTLES DRAWN AEROBIC AND ANAEROBIC Blood Culture adequate volume Performed at [facility], [address] [place], [place] [address]     Culture  Setup Time   Final    GRAM POSITIVE COCCI IN BOTH AEROBIC AND ANAEROBIC BOTTLES CRITICAL RESULT CALLED TO, READ BACK BY AND VERIFIED WITH: ESTILL HALLAJI 08/19/24 0915 MW    Culture (A)  Final    STAPHYLOCOCCUS AUREUS SUSCEPTIBILITIES TO FOLLOW Performed at [facility], [address] [place], [place] [address]    Report Status PENDING  Incomplete  Blood Culture ID Panel (Reflexed)     Status: Abnormal   Collection Time: 08/18/24 10:33 PM  Result Value Ref Range Status   Enterococcus faecalis NOT DETECTED NOT DETECTED Final   Enterococcus Faecium NOT DETECTED NOT DETECTED Final   Listeria monocytogenes NOT DETECTED NOT DETECTED Final   Staphylococcus species DETECTED (A) NOT DETECTED Final    Comment: CRITICAL RESULT CALLED TO, READ BACK BY AND VERIFIED WITH: SHEEMA HALLAJI 08/19/24 0915 MW    Staphylococcus aureus (BCID) DETECTED (A) NOT DETECTED Final    Comment: CRITICAL RESULT CALLED TO, READ BACK BY AND VERIFIED WITH: SHEEMA HALLAJI 08/19/24 0915 MW    Staphylococcus epidermidis NOT DETECTED NOT DETECTED Final   Staphylococcus lugdunensis NOT DETECTED NOT DETECTED Final   Streptococcus species NOT DETECTED NOT DETECTED Final   Streptococcus agalactiae NOT DETECTED NOT DETECTED Final   Streptococcus pneumoniae NOT DETECTED NOT DETECTED Final   Streptococcus pyogenes NOT DETECTED NOT DETECTED Final   A.calcoaceticus-baumannii NOT DETECTED NOT DETECTED Final   Bacteroides fragilis NOT DETECTED NOT DETECTED Final   Enterobacterales NOT DETECTED NOT DETECTED Final   Enterobacter cloacae complex NOT DETECTED NOT DETECTED Final   Escherichia coli NOT DETECTED NOT DETECTED Final   Klebsiella aerogenes NOT DETECTED NOT DETECTED Final   Klebsiella oxytoca NOT DETECTED NOT DETECTED Final   Klebsiella pneumoniae NOT DETECTED NOT DETECTED Final   Proteus species NOT DETECTED NOT DETECTED Final   Salmonella species NOT DETECTED NOT DETECTED Final   Serratia marcescens  NOT DETECTED NOT DETECTED Final   Haemophilus influenzae NOT DETECTED NOT DETECTED Final   Neisseria meningitidis NOT DETECTED NOT DETECTED Final   Pseudomonas aeruginosa NOT DETECTED NOT DETECTED Final   Stenotrophomonas maltophilia NOT DETECTED NOT DETECTED Final   Candida albicans NOT DETECTED NOT DETECTED Final   Candida auris NOT DETECTED NOT DETECTED Final   Candida glabrata NOT DETECTED NOT DETECTED Final   Candida krusei NOT DETECTED NOT DETECTED Final   Candida parapsilosis NOT DETECTED NOT DETECTED Final   Candida tropicalis NOT DETECTED NOT DETECTED Final   Cryptococcus neoformans/gattii NOT DETECTED NOT DETECTED Final   Meth resistant mecA/C and MREJ NOT DETECTED NOT DETECTED Final    Comment: Performed at [facility], [address] [place], [place] [address]    Coagulation Studies: No results for input(s): LABPROT, INR in the last 72 hours.  Urinalysis: No results for input(s): COLORURINE, LABSPEC, PHURINE, GLUCOSEU, HGBUR, BILIRUBINUR, KETONESUR, PROTEINUR, UROBILINOGEN, NITRITE, LEUKOCYTESUR in the last 72 hours.  Invalid input(s): APPERANCEUR    Imaging: ECHOCARDIOGRAM COMPLETE Result Date: 08/19/2024    ECHOCARDIOGRAM REPORT   Patient Name:   [name] [facility] Date of Exam: 08/18/2024 Medical Rec #:  [id number]        Height:       73.0 in Accession #:    [id number]       Weight:       190.0 lb Date of Birth:  11-Sep-1945         BSA:          2.105 m Patient Age:    78 years         BP:           124/76 mmHg Patient Gender: M                HR:           115 bpm. Exam Location:  [facility] Procedure: 2D Echo, Cardiac Doppler and Color Doppler (Both Spectral and Color            Flow Doppler were utilized during procedure). Indications:     I50.21 Acute Systolic CHF  History:         Patient has prior history of Echocardiogram examinations, most                  recent 03/03/2024. 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  Sonographer:     Carl Coma RDCS Referring Phys:  [id number] MORENE BATHE Diagnosing Phys: Dwayne D Callwood MD IMPRESSIONS  1. Left ventricular ejection fraction, by estimation, is 60 to 65%. The left ventricle has normal function. The left ventricle has no regional wall motion abnormalities. There is severe concentric left ventricular hypertrophy. Left ventricular diastolic  parameters are consistent with Grade III diastolic dysfunction (restrictive). There is the interventricular septum is flattened in diastole ('D' shaped left ventricle), consistent with right ventricular volume overload.  2. Right ventricular systolic function is moderately reduced. The right ventricular size is moderately enlarged. Mildly increased right ventricular wall thickness.  3. Left atrial size was severely dilated.  4. Right atrial size was moderately dilated.  5. The mitral valve is grossly normal. Moderate mitral valve regurgitation. Moderate mitral annular calcification.  6. Tricuspid valve regurgitation is moderate to severe.  7. The aortic valve is grossly normal. Aortic valve regurgitation is mild. Aortic valve sclerosis/calcification is present, without any evidence of aortic stenosis. 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 FINDINGS  Left Ventricle: Left ventricular ejection fraction, by estimation, is 60 to 65%. The left ventricle has normal function. The left ventricle has no regional wall motion abnormalities. Strain was performed and the global longitudinal strain is indeterminate. The left ventricular internal cavity size was normal in size. There is severe concentric left ventricular hypertrophy. The interventricular septum is  flattened in diastole ('D' shaped left ventricle), consistent  with right ventricular volume overload. Left ventricular diastolic parameters are consistent with Grade III diastolic dysfunction (restrictive). Right Ventricle: The right ventricular size is moderately enlarged. Mildly increased right ventricular wall thickness. Right ventricular systolic function is moderately reduced. Left Atrium: Left atrial size was severely dilated. Right Atrium: Right atrial size was moderately dilated. Pericardium: There is no evidence of pericardial effusion. Mitral Valve: The mitral valve is grossly normal. There is moderate thickening of the mitral valve leaflet(s). There is moderate calcification of the mitral valve leaflet(s). Moderate mitral annular calcification. Moderate mitral valve regurgitation. Tricuspid Valve: The tricuspid valve is grossly normal. Tricuspid valve regurgitation is moderate to severe. The aortic valve is grossly normal. Aortic valve regurgitation is mild. Aortic valve sclerosis/calcification is present, without any evidence of aortic stenosis. 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 Pulmonic Valve: The pulmonic valve was normal in structure. Pulmonic valve regurgitation is not visualized. Aorta: The aortic root was not well visualized. IAS/Shunts: No atrial level shunt detected by color flow Doppler. Additional Comments: 3D was performed not requiring image post processing on an independent workstation and was indeterminate.  LEFT VENTRICLE PLAX 2D LVIDd:         4.50 cm LVIDs:         3.20 cm LV PW:         1.80 cm LV IVS:        1.80 cm  RIGHT VENTRICLE            IVC RV Basal diam:  4.90 cm    IVC diam: 2.10 cm RV S prime:     6.74 cm/s TAPSE (M-mode): 1.2 cm LEFT ATRIUM             Index        RIGHT ATRIUM           Index LA diam:        5.30 cm 2.52 cm/m   RA Area:     28.30 cm LA Vol (A2C):   94.4 ml 44.84 ml/m  RA Volume:   102.00 ml  48.45 ml/m LA Vol (A4C):   92.0 ml 43.70 ml/m LA Biplane Vol: 97.0 ml 46.08 ml/m  AORTIC VALVE AV Vmax:           302.00 cm/s AV Vmean:          212.200 cm/s AV VTI:            0.447 m AV Peak Grad:      36.5 mmHg AV Mean Grad:      20.6 mmHg LVOT Vmax:         81.03 cm/s LVOT Vmean:        52.067 cm/s LVOT VTI:          0.135 m LVOT/AV VTI ratio: 0.30 AI PHT:            277 msec  AORTA Ao Root diam: 3.40 cm Ao Asc diam:  3.90 cm MR Peak grad: 112.8 mmHg    TRICUSPID VALVE MR Vmax:      531.00 cm/s   TR Peak grad:   58.7 mmHg MV E velocity: 163.25 cm/s  TR Vmax:        383.00 cm/s  SHUNTS                             Systemic VTI: 0.14 m Cara JONETTA Lovelace MD Electronically signed by Cara JONETTA Lovelace MD Signature Date/Time: 08/19/2024/12:52:25 PM    Final    DG Hand Complete Left Result Date: 08/19/2024 EXAM: 3 OR MORE VIEW(S) XRAY OF THE LEFT HAND 08/19/2024 12:49:00 AM COMPARISON: 08/18/2024. CLINICAL HISTORY: Swelling. FINDINGS: BONES AND JOINTS: No acute fracture, subluxation or dislocation. No malalignment. Calcifications about the left wrist, likely synovial calcifications. Arthritic changes throughout the left wrist. SOFT TISSUES: Small radiopaque foreign body noted in the soft tissues of the left middle finger anterior to the middle phalanx. IMPRESSION: 1. Small radiopaque foreign body in the soft tissues of the left middle finger anterior to the middle phalanx. 2. No acute fracture, subluxation, or dislocation. Electronically signed by: Franky Crease MD 08/19/2024 01:00 AM EST RP Workstation: [id number]   US  Venous Img Upper Uni Left (DVT) Result Date: 08/19/2024 CLINICAL DATA:  Swelling EXAM: Left UPPER EXTREMITY VENOUS DOPPLER ULTRASOUND TECHNIQUE: Gray-scale sonography with graded compression, as well as color Doppler and duplex ultrasound were performed to evaluate the upper extremity deep venous system from the level of the subclavian vein and including the jugular,  axillary, basilic, radial, ulnar and upper cephalic vein. Spectral Doppler was utilized to evaluate flow at rest and with distal augmentation maneuvers. COMPARISON:  None Available. FINDINGS: Contralateral Subclavian Vein: Respiratory phasicity is normal and symmetric with the symptomatic side. No evidence of thrombus. Internal Jugular Vein: No evidence of thrombus. Normal compressibility, respiratory phasicity and response to augmentation. Subclavian Vein: No evidence of thrombus. Normal respiratory phasicity and response to augmentation. Axillary Vein: No evidence of thrombus. Normal compressibility, respiratory phasicity and response to augmentation. Cephalic Vein: No evidence of thrombus. Normal compressibility, respiratory phasicity and response to augmentation. Basilic Vein: No evidence of thrombus. Normal compressibility, respiratory phasicity and response to augmentation. Brachial Veins: No evidence of thrombus. Normal compressibility, respiratory phasicity and response to augmentation. Radial Veins: No evidence of thrombus. Normal compressibility, respiratory phasicity and response to augmentation. Ulnar Veins: No evidence of thrombus. Normal compressibility, respiratory phasicity and response to augmentation. IMPRESSION: No evidence of DVT within the left upper extremity. Electronically Signed   By: Luke Bun M.D.   On: 08/19/2024 00:31   DG Shoulder Left Result Date: 08/18/2024 EXAM: 1 VIEW(S) XRAY OF THE LEFT SHOULDER 08/18/2024 07:58:00 PM COMPARISON: None available. CLINICAL HISTORY: Shoulder pain, left. FINDINGS: BONES AND JOINTS: Glenohumeral joint is normally aligned. No acute fracture. No malalignment. 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 SOFT TISSUES: No abnormal calcifications. Visualized lung is unremarkable. IMPRESSION: 1. 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 Electronically signed by: Elsie Gravely MD 08/18/2024 08:12 PM  EST RP Workstation: [id number]   DG Hand 2 View Left Result Date: 08/18/2024 EXAM: 1 or 2 VIEW(S) XRAY OF THE LEFT HAND 08/18/2024 07:58:00 PM COMPARISON: None available. CLINICAL HISTORY: Pain FINDINGS: BONES AND JOINTS: Prominent degenerative changes in the interphalangeal joints, first carpometacarpal and metacarpophalangeal joints, radiocarpal, radioulnar, and intercarpal joints. Old healed fracture deformity of the distal radius. Subcortical cyst in the base of the 1st metacarpal, likely degenerative cyst. SOFT TISSUES: Soft tissue calcifications in and around the wrist likely representing synovial calcifications. Dorsal soft tissue swelling. Tiny metallic foreign body suggested in the soft tissues of the third finger adjacent to the middle phalanx. IMPRESSION: 1. Dorsal soft tissue swelling.  2. Tiny metallic foreign body suggested in the soft tissues of the third finger adjacent to the middle phalanx. 3. Prominent degenerative changes in the interphalangeal joints, first carpometacarpal and metacarpophalangeal joints, radiocarpal, radioulnar, and intercarpal joints. 4. Soft tissue calcifications in and around the wrist, likely representing synovial calcifications. 5. Old healed fracture deformity of the distal radius. Electronically signed by: Elsie Gravely MD 08/18/2024 08:11 PM EST RP Workstation: [id number]   DG Chest Portable 1 View Result Date: 08/18/2024 EXAM: 1 VIEW(S) XRAY OF THE CHEST 08/18/2024 04:53:00 PM COMPARISON: 08/10/2024 CLINICAL HISTORY: SOB FINDINGS: LUNGS AND PLEURA: Moderate right pleural effusion, stable compared to prior. Small left pleural effusion. Mild diffuse interstitial prominence. Bibasilar airspace opacities, likely compressive atelectasis. No pneumothorax. HEART AND MEDIASTINUM: Enlarged cardiomediastinal silhouette, unchanged. Median sternotomy and TAVR noted. Atherosclerotic calcifications. BONES AND SOFT TISSUES: No acute osseous abnormality. IMPRESSION: 1. Moderate  right pleural effusion and small left pleural effusion. 2. Mild diffuse interstitial prominence and bibasilar airspace opacities, likely compressive atelectasis. Electronically signed by: Greig Pique MD 08/18/2024 05:32 PM EST RP Workstation: [id number]     Medications:    sodium chloride  40 mL/hr at 08/20/24 1436    ceFAZolin  (ANCEF ) IV 2 g (08/20/24 1015)    acetaminophen   650 mg Oral TID   Or   acetaminophen   650 mg Rectal TID   ferrous sulfate   325 mg Oral Daily   Gerhardt's butt cream   Topical TID   insulin  aspart  0-15 Units Subcutaneous TID WC   insulin  aspart  0-5 Units Subcutaneous QHS   methocarbamol   500 mg Oral Q6H   pantoprazole   40 mg Oral Daily   sodium chloride  flush  3 mL Intravenous Q12H   acetaminophen , HYDROmorphone  (DILAUDID ) injection, HYDROmorphone , ondansetron  **OR** ondansetron  (ZOFRAN ) IV, senna-docusate  Assessment/ Plan:  [name] is a 79 y.o.  male with past medical conditions including hypertension, CAD status post CABG, type 2 diabetes, hypertension, severe pulmonary hypertension, and chronic kidney disease stage IIIb.  Patient presents to the emergency department with complaints of lower extremity edema and has been admitted for Failure to thrive in adult [R62.7] Fall in home, initial encounter [W19.XXXA, Y92.009] 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] Acute on chronic congestive heart failure, unspecified heart failure type (HCC) [I50.9]   Acute Kidney Injury on chronic kidney disease stage 3B with baseline creatinine 2.1 to and GFR of 32 on April 29, 2024.  Acute kidney injury secondary to aggressive diuresis.  Patient has received IV furosemide  during this admission to manage volume status.  Will order renal ultrasound in a.m. to evaluate obstruction.  Agree with holding IV furosemide  today.  No acute indication for dialysis.  Will monitor patient presentation and labs.   Lab Results  Component Value Date   CREATININE 3.05  (H) 08/20/2024   CREATININE 2.66 (H) 08/19/2024   CREATININE 2.53 (H) 08/18/2024    Intake/Output Summary (Last 24 hours) at 08/20/2024 1457 Last data filed at 08/20/2024 1433 Gross per 24 hour  Intake 606 ml  Output 250 ml  Net 356 ml   2. Anemia of chronic kidney disease Lab Results  Component Value Date   HGB 7.2 (L) 08/20/2024    Hgb decreased, will defer need for blood transfusion to primary team  3. Secondary Hyperparathyroidism: with outpatient labs: PTH 108, phosphorus 2.9, calcium  9.7 on 04/29/24.   Lab Results  Component Value Date   PTH 61 09/03/2020   PTH Comment 09/03/2020   CALCIUM  9.9 08/20/2024  CAION 1.37 07/01/2024   PHOS 3.3 08/14/2024    Will continue to monitor bone minerals during this admission.   4. Diabetes mellitus type II with chronic kidney disease/renal manifestations: noninsulin dependent. Home regimen includes Jardiance . Most recent hemoglobin A1c is 4.6 on 03/03/24.     LOS: 2 [name] 1/8/20262:57 PM   "

## 2024-08-20 NOTE — [place] FL2 (Signed)
 "   MEDICAID FL2 LEVEL OF CARE FORM     IDENTIFICATION  Patient Name: [name] Birthdate: 1945-10-25 Sex: male Admission Date (Current Location): 08/18/2024  [place] and [place] Number:  Chiropodist and Address:  [facility], [address] [place], [place] [address]      Provider Number: [id number]  Attending Physician Name and Address:  Cosette Blackwater, MD  Relative Name and Phone Number:  Clarnce Homan  [phone]    Current Level of Care: Hospital Recommended Level of Care: Skilled Nursing Facility Prior Approval Number:    Date Approved/Denied:   PASRR Number: [id number]  Discharge Plan: SNF    Current Diagnoses: Patient Active Problem List   Diagnosis Date Noted   Bacteremia due to methicillin susceptible Staphylococcus aureus (MSSA) 08/19/2024   History of transcatheter aortic valve replacement (TAVR) 08/19/2024   Failure to thrive in adult 08/18/2024   Acute heart failure with preserved ejection fraction (HCC) 08/11/2024   Lymphadenopathy 07/21/2024   Acute on chronic heart failure with preserved ejection fraction (HCC) 07/19/2024   Possible fluid overload due to blood transfusion (11/20. 11/22, 07/16/2024) 07/18/2024   Anemia in chronic kidney disease 04/14/2024   Other specified anemias 03/27/2024   Chest pain 03/02/2024   Diabetes mellitus type 2, noninsulin dependent (HCC) 03/02/2024   CKD (chronic kidney disease), stage IV (HCC) 03/02/2024   Respiratory arrest (HCC) 03/02/2024   Unresponsiveness 03/02/2024   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/2024   IDA (iron  deficiency anemia) 12/16/2023   Left ventricular hypertrophy 12/16/2023   Thrombocytopenia 12/16/2023   Stage 4 chronic kidney disease (HCC) 12/16/2023   Anemia of chronic disease 12/16/2023   Pleural effusion, bilateral 12/14/2023   Chronic diastolic CHF (congestive heart failure) (HCC) 11/12/2023   Murmur, cardiac 11/12/2023    Polyp of transverse colon 10/19/2023   Gastric polyps 10/19/2023   Acute exacerbation of CHF (congestive heart failure) (HCC) 10/16/2023   Severe anemia 10/16/2023   Dyspnea on exertion 10/16/2023   Personal history of other malignant neoplasm of skin 10/16/2023   Moderate to severe mitral and tricuspid regurgitation 09/15/2023   Melena 08/26/2023   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   CHF exacerbation (HCC) 08/22/2023   Upper GI bleed 08/22/2023   Recurrent pleural effusion on right 08/22/2023   Symptomatic anemia 08/22/2023   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/01/2023   Status post Mohs surgery 05/01/2023   Right inguinal hernia 02/19/2023   Umbilical hernia without obstruction and without gangrene 02/19/2023   Hyponatremia 12/26/2022   Acute kidney injury superimposed on stage 3b chronic kidney disease (HCC) 12/26/2022   Severe pulmonary hypertension (HCC) 12/26/2022   Vitamin D  deficiency 12/26/2022   Acute hypoxic respiratory failure (HCC) 12/26/2022   Moderate mitral regurgitation 12/26/2022   Severe tricuspid regurgitation 12/26/2022   Acute heart failure with preserved ejection fraction (HFpEF) (HCC) 12/20/2022   Chronic venous stasis 12/20/2022   Paroxysmal atrial fibrillation (HCC) 12/20/2022   Iron  deficiency anemia 05/17/2021   Thyroid  nodule 11/04/2020   Lymphedema 10/27/2020   Gastric mass    Polyp of sigmoid colon    Acute blood loss anemia 09/04/2020   COVID-19 virus infection    Hypercalcemia    Pica    Acute on chronic blood loss anemia 09/02/2020   Hyperlipidemia associated with type 2 diabetes mellitus (HCC) 09/02/2020   S/P drug eluting coronary stent placement 06/09/2020   Other hyperlipidemia 08/18/2019   T2DM (type 2 diabetes  mellitus) (HCC) 01/14/2017   Essential hypertension 01/14/2017   OSA (obstructive sleep apnea) 01/14/2017   At risk for fluid volume overload 09/08/2016   BBB (bundle branch block) 08/30/2016   Obesity with  body mass index (BMI) of 30.0 to 39.9 08/30/2016   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   1st degree AV block 08/30/2016   CAD (coronary artery disease), s/p CABG x 3 08/20/2016   History of colonic polyps 11/05/2013    Orientation RESPIRATION BLADDER Height & Weight     Self, Time, Situation, Place  O2 Incontinent Weight: 85.6 kg Height:  6' 1 (185.4 cm)  BEHAVIORAL SYMPTOMS/MOOD NEUROLOGICAL BOWEL NUTRITION STATUS     (none) Continent Diet (Heart Healthy)  AMBULATORY STATUS COMMUNICATION OF NEEDS Skin   Extensive Assist Verbally PU Stage and Appropriate Care (Pressure injury, buttocks, mid stage 1- foam dressing. Pressure injury, buttocks, mid stage 3 - foam dressing) PU Stage 1 Dressing:  (twice weekly, prn if soiled)   PU Stage 3 Dressing:  (twice weekly, PRN if soiled)                 Personal Care Assistance Level of Assistance  Bathing, Feeding, Dressing Bathing Assistance: Maximum assistance Feeding assistance: Limited assistance Dressing Assistance: Maximum assistance     Functional Limitations Info  Sight, Hearing, Speech Sight Info: Impaired Hearing Info: Adequate Speech Info: Adequate    SPECIAL CARE FACTORS FREQUENCY  PT (By licensed PT), OT (By licensed OT)     PT Frequency: 5 x week OT Frequency: 5x week            Contractures Contractures Info: Not present    Additional Factors Info  Code Status, Allergies Code Status Info: Full Allergies Info: NKDA           Current Medications (08/20/2024):  This is the current hospital active medication list Current Facility-Administered Medications  Medication Dose Route Frequency Provider Last Rate Last Admin   0.9 %  sodium chloride  infusion   Intravenous Continuous Druscilla Bald, NP 40 mL/hr at 08/20/24 1436 New Bag at 08/20/24 1436   acetaminophen  (TYLENOL ) tablet 650 mg  650 mg Oral TID Arvid Collar, FNP   650 mg at 08/20/24 9048   Or   acetaminophen  (TYLENOL ) suppository 650 mg  650 mg Rectal TID  Arvid Collar, FNP       acetaminophen  (TYLENOL ) tablet 650 mg  650 mg Oral Q6H PRN Arvid Collar, FNP       ceFAZolin  (ANCEF ) IVPB 2g/100 mL premix  2 g Intravenous Q12H Fernand Prost, MD 200 mL/hr at 08/20/24 1015 2 g at 08/20/24 1015   ferrous sulfate  tablet 325 mg  325 mg Oral Daily Tariq, Hassan, MD   325 mg at 08/20/24 9047   Gerhardt's butt cream   Topical TID Tariq, Hassan, MD   Given at 08/20/24 0951   HYDROmorphone  (DILAUDID ) injection 0.5 mg  0.5 mg Intravenous Q4H PRN Khan, Ghalib, MD   0.5 mg at 08/20/24 0553   HYDROmorphone  (DILAUDID ) tablet 1 mg  1 mg Oral Q4H PRN Khan, Ghalib, MD   1 mg at 08/20/24 1423   insulin  aspart (novoLOG ) injection 0-15 Units  0-15 Units Subcutaneous TID WC Khan, Ghalib, MD   3 Units at 08/20/24 1300   insulin  aspart (novoLOG ) injection 0-5 Units  0-5 Units Subcutaneous QHS Khan, Ghalib, MD   4 Units at 08/19/24 0030   methocarbamol  (ROBAXIN ) tablet 500 mg  500 mg Oral Q6H Tariq, Hassan, MD   500  mg at 08/20/24 1300   ondansetron  (ZOFRAN ) tablet 4 mg  4 mg Oral Q6H PRN Fernand Prost, MD       Or   ondansetron  (ZOFRAN ) injection 4 mg  4 mg Intravenous Q6H PRN Fernand Prost, MD       pantoprazole  (PROTONIX ) EC tablet 40 mg  40 mg Oral Daily Tariq, Hassan, MD   40 mg at 08/20/24 0951   senna-docusate (Senokot-S) tablet 1 tablet  1 tablet Oral QHS PRN Fernand Prost, MD       sodium chloride  flush (NS) 0.9 % injection 3 mL  3 mL Intravenous Q12H Khan, Ghalib, MD   3 mL at 08/20/24 9047     Discharge Medications: Please see discharge summary for a list of discharge medications.  Relevant Imaging Results:  Relevant Lab Results:   Additional Information SS#: [id number]  [name], RN     "

## 2024-08-20 NOTE — Plan of Care (Signed)

## 2024-08-20 NOTE — Progress Notes (Signed)
 "                                                                                                                                                                                               Palliative Care Progress Note, Assessment & Plan   Patient Name: [name]       Date: 08/20/2024 DOB: April 25, 1946  Age: 79 y.o. MRN#: [id number] Attending Physician: Cosette Blackwater, MD Primary Care Physician: [name], MD Admit Date: 08/18/2024 Chief Complaint: increased weakness and dyspnea  Subjective: Patient is lying in bed in no apparent distress.  He is asleep but awakens to my presence.  His eyes remain closed during our discussion but he is participatory, alert, and oriented x 4 throughout our discussion.  Patient's son Curtistine is at bedside during her visit.  HPI: 79 y.o. male  with past medical history of HTN, HLD, CAD s/p CABG, type 2 diabetes, CKD (stage IIIb), CHF (recent EF 60-65%), and severe pulmonary HTN admitted on 08/18/2024 with increasing weakness and dyspnea.   Patient has had 4 inpatient admissions and 1 ED visit in the last 6 months.  Most recent admission was 1229-08/15/2024 for acute exacerbation of heart failure.   Upon presentation to the ED, patient endorses he was weak and having difficulty with ambulation as well as pain in his hand/shoulder on his left side.  Vital signs were within normal limits and patient was hemodynamically stable.  Lab work revealed moderate leukocytosis-16.9, hemoglobin at baseline 8.6, AKI on CKD with creatinine 2.  5 3, WBC mildly elevated at 14.8.  Blood cultures revealed MSSA patient currently on cefazolin .  Radiographs of left hand revealed small foreign body in soft tissues of left middle finger anterior to the middle phalanx.   Patient is current being treated for failure to thrive, acute hypoxic respiratory failure, acute on chronic HFpEF, AKI on CKD, recurrent pleural effusions, MSSA bacteremia, chronic anemia, demand ischemia.   Patient  is being followed by infectious disease, cardiology, PT/OT.   PMT was consulted to support patient and family with goals of care discussions  Summary of counseling/coordination of care: Chart review completed prior to meeting patient including: -Labs: Increase in creatinine to 3.05 from 2.66, hemoglobin down to 7.2 from 8.5.  Prior to meeting with patient, I counseled with attending Dr.Tariq.  Given drop in hemoglobin, 1 unit of RBC ordered.  Additionally, given increasing creatinine, nephrology has been consulted.  Reviewed that patient continues to decompensate, have continued weakness, and does not appear to be making improvements despite maximizing medical efforts.  After counseling with attending, I met with patient and  his son Curtistine at bedside.   Symptoms assessed.  Patient endorses he is in no pain if he does not move.  He endorses the most pain is the stiff and tight feeling in his neck.  Otherwise, he endorses generalized mild pain throughout his entire body.  Reviewed that Tylenol  has been adjusted yesterday.  He endorses it helped mildly.  However, he endorses that Dilaudid  as needed is most effective in reducing his pain.  However, he endorses it wears off too early and he has to ask for it more often than he thought he would need to.  Reviewed medication regimen with patient and encouraged him to utilize p.o. Dilaudid  for baseline pain control and utilize IV Dilaudid  for breakthrough pain.  He endorsed understanding.  He has just taken 1 tablet of Dilaudid  (1 mg).  Awaiting response to see if he needs breakthrough IV Dilaudid .    Also discussed that patient has IV Dilaudid  for breakthrough pain available every 4 hours.  He has not had it in 4-hour intervals.  Education provided the patient can ask for it every 4 hours if needed.  He endorsed understanding and appreciation.  Additionally, I recommend Robaxin  p.o. Q6 to address muscle stiffness.  Hopefully this combined with Tylenol  and  Dilaudid  will have a synergistic effect on relieving patient's discomfort.  I shared my concern that despite maximizing medical efforts and treatments, patient is not improving.  He endorsed understanding.  I shared that treatment options are limited at this time given patient's kidney function (avoiding nephrotoxic agents).  He is frail, has a poor functional status, low p.o. intake, and overall is displaying increased weakness.  He endorses understanding.  He shares he wishes to continue to fight.  Discussed remaining hopeful and realistic, human mortality, and the limitations of the human body when it begins to fail to thrive.  He shares understanding but asked that we discussed these topics again tomorrow.  I highlighted that if patient continues to decompensate we would need to discuss transitioning to comfort care measures and focus on relief of pain and discomfort for end-of-life symptoms.  He endorsed understanding but wishes to defer this discussion to a later time.  I implored him to reconsider his CODE STATUS.  In the event that patient suffers a cardiac arrest, he is accepting of all offered, available, and appropriate medical interventions to sustain his life. Encouraged patient/family to consider DNR/DNI status understanding evidenced based poor outcomes in similar hospitalized patients, as the cause of the arrest is likely associated with chronic/terminal disease rather than a reversible acute cardio-pulmonary event.    Patient's son Curtistine at bedside given space and opportunity to ask questions and voice concerns.  He shares he has no concerns at this time but is grateful for the adjustments to try to get his pain and discomfort under better control.  Adjustment to Robaxin  reviewed with pharmacist and attending.  PMT will continue to follow and support.  Physical Exam Vitals reviewed.  Constitutional:      Appearance: He is normal weight.  HENT:     Head: Normocephalic.      Mouth/Throat:     Mouth: Mucous membranes are dry.  Eyes:     Pupils: Pupils are equal, round, and reactive to light.  Pulmonary:     Effort: Pulmonary effort is normal.  Abdominal:     Palpations: Abdomen is soft.  Musculoskeletal:     Comments: Generalized weakness  Skin:    General: Skin is warm and  dry.     Coloration: Skin is pale.  Neurological:     Mental Status: He is alert and oriented to person, place, and time.  Psychiatric:        Mood and Affect: Mood normal.        Behavior: Behavior normal.              Recommendations:   Full code and full scope remains Education provided on comfort measures should patient continue to show signs of decline Robaxin  500 mg p.o. Q6H combination with Tylenol  to address patient's muscle stiffness and pain  I personally spent a total of 35 minutes in the care of the patient today including preparing to see the patient, getting/reviewing separately obtained history, performing a medically appropriate exam/evaluation, counseling and educating, and documenting clinical information in the EHR.   Lamarr L. Arvid, DNP, FNP-BC Palliative Medicine Team   "

## 2024-08-20 NOTE — Progress Notes (Signed)
 pt came in with sacral wound , LDA created and skin care standing orders in place. Wound pictures and measurement were obtained and documented. bilateral buttock noted to be red and non blanchable. The right buttock has an opening pressure injury approximately 0.5 cm . Wound consult placed and provider notified.

## 2024-08-20 NOTE — Progress Notes (Signed)
 "  Date of Admission:  08/18/2024     ID: [name] is a 79 y.o. male  Principal Problem:   Failure to thrive in adult Active Problems:   T2DM (type 2 diabetes mellitus) (HCC)   Essential hypertension   OSA (obstructive sleep apnea)   CAD (coronary artery disease), s/p CABG x 3   Hyperlipidemia associated with type 2 diabetes mellitus (HCC)   Paroxysmal atrial fibrillation (HCC)   Acute kidney injury superimposed on stage 3b chronic kidney disease (HCC)   Moderate mitral regurgitation   Anemia in chronic kidney disease   Acute heart failure with preserved ejection fraction (HCC)   Bacteremia due to methicillin susceptible Staphylococcus aureus (MSSA)   History of transcatheter aortic valve replacement (TAVR)    Subjective: Pt is very tired and sleepy Pain left thumb and shoulder improved  Medications:   acetaminophen   650 mg Oral TID   Or   acetaminophen   650 mg Rectal TID   ferrous sulfate   325 mg Oral Daily   Gerhardt's butt cream   Topical TID   insulin  aspart  0-15 Units Subcutaneous TID WC   insulin  aspart  0-5 Units Subcutaneous QHS   methocarbamol   500 mg Oral Q6H   pantoprazole   40 mg Oral Daily   sodium chloride  flush  3 mL Intravenous Q12H    Objective: Vital signs in last 24 hours: Patient Vitals for the past 24 hrs:  BP Temp Temp src Pulse Resp SpO2 Weight  08/20/24 1440 103/61 97.6 F (36.4 C) -- 66 18 100 % --  08/20/24 1135 (!) 111/56 (!) 97.5 F (36.4 C) Oral 74 18 100 % --  08/20/24 1117 102/60 (!) 97.4 F (36.3 C) Oral 69 18 100 % --  08/20/24 0915 (!) 114/51 97.6 F (36.4 C) -- 77 20 100 % --  08/20/24 0500 -- -- -- -- -- -- 85.6 kg  08/20/24 0350 118/63 98.3 F (36.8 C) -- 79 18 97 % --  08/19/24 2229 (!) 110/58 (!) 97.4 F (36.3 C) -- 72 20 95 % --  08/19/24 2130 (!) 111/52 -- -- 77 15 100 % --  08/19/24 2100 110/60 -- -- 78 17 100 % --  08/19/24 2030 (!) 107/56 -- -- 81 19 100 % --  08/19/24 1951 (!) 109/54 98.3 F (36.8 C) Oral  75 20 100 % --  08/19/24 1811 (!) 112/50 98.1 F (36.7 C) Oral 78 (!) 22 100 % --  08/19/24 1606 (!) 110/52 (!) 101.8 F (38.8 C) Oral 71 -- 100 % --    PHYSICAL EXAM:  General: somnolent, easily arousable, follows commands, chronically ill Lungs: b/l air entry. Heart: Regular rate and rhythm, no murmur, rub or gallop. Abdomen: Soft, non-tender,not distended. Bowel sounds normal. No masses Extremities:left thumb less swollen, more movt Rt shoulder more movt  Skin: No rashes or lesions. Or bruising Lymph: Cervical, supraclavicular normal. Neurologic: Grossly non-focal  Lab Results    Latest Ref Rng & Units 08/20/2024    4:20 AM 08/19/2024    4:25 AM 08/18/2024    4:41 PM  CBC  WBC 4.0 - 10.5 K/uL 11.2  14.8  16.9   Hemoglobin 13.0 - 17.0 g/dL 7.2  8.5  8.6   Hematocrit 39.0 - 52.0 % 22.9  27.1  27.4   Platelets 150 - 400 K/uL 78  71  86        Latest Ref Rng & Units 08/20/2024    4:20 AM 08/19/2024  4:25 AM 08/18/2024    4:41 PM  CMP  Glucose 70 - 99 mg/dL 793  788  796   BUN 8 - 23 mg/dL 88  76  69   Creatinine 0.61 - 1.24 mg/dL 6.94  7.33  7.46   Sodium 135 - 145 mmol/L 135  136  136   Potassium 3.5 - 5.1 mmol/L 4.8  4.8  4.9   Chloride 98 - 111 mmol/L 93  94  94   CO2 22 - 32 mmol/L 30  32  30   Calcium  8.9 - 10.3 mg/dL 9.9  89.5  88.7   Total Protein 6.5 - 8.1 g/dL  6.6  7.4   Total Bilirubin 0.0 - 1.2 mg/dL  1.9  2.5   Alkaline Phos 38 - 126 U/L  115  134   AST 15 - 41 U/L  23  31   ALT 0 - 44 U/L  15  18       Microbiology: [facility] MSSA 1/6  Studies/Results: ECHOCARDIOGRAM COMPLETE Result Date: 08/19/2024    ECHOCARDIOGRAM REPORT   Patient Name:   [name] Date of Exam: 08/18/2024 Medical Rec #:  [id number]        Height:       73.0 in Accession #:    [id number]       Weight:       190.0 lb Date of Birth:  1946-01-26         BSA:          2.105 m Patient Age:    78 years         BP:           124/76 mmHg Patient Gender: M                HR:           115 bpm. Exam  Location:  [facility] Procedure: 2D Echo, Cardiac Doppler and Color Doppler (Both Spectral and Color            Flow Doppler were utilized during procedure). Indications:     I50.21 Acute Systolic CHF  History:         Patient has prior history of Echocardiogram examinations, most                  recent 03/03/2024. CHF, CAD, Prior CABG and TAVR-01/23/24,                  Arrythmias:LBBB, RBBB and Atrial Fibrillation,                  Signs/Symptoms:Murmur and Dyspnea; Risk Factors:Hypertension,                  Diabetes and Dyslipidemia.  Sonographer:     Carl Coma RDCS Referring Phys:  [id number] MORENE BATHE Diagnosing Phys: Dwayne D Callwood MD IMPRESSIONS  1. Left ventricular ejection fraction, by estimation, is 60 to 65%. The left ventricle has normal function. The left ventricle has no regional wall motion abnormalities. There is severe concentric left ventricular hypertrophy. Left ventricular diastolic  parameters are consistent with Grade III diastolic dysfunction (restrictive). There is the interventricular septum is flattened in diastole ('D' shaped left ventricle), consistent with right ventricular volume overload.  2. Right ventricular systolic function is moderately reduced. The right ventricular size is moderately enlarged. Mildly increased right ventricular wall thickness.  3. Left atrial size was severely dilated.  4. Right atrial size was moderately dilated.  5. The mitral valve is grossly normal. Moderate mitral valve regurgitation. Moderate mitral annular calcification.  6. Tricuspid valve regurgitation is moderate to severe.  7. The aortic valve is grossly normal. Aortic valve regurgitation is mild. Aortic valve sclerosis/calcification is present, without any evidence of aortic stenosis. 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 FINDINGS  Left Ventricle: Left ventricular ejection fraction, by estimation, is 60 to 65%. The left ventricle has  normal function. The left ventricle has no regional wall motion abnormalities. Strain was performed and the global longitudinal strain is indeterminate. The left ventricular internal cavity size was normal in size. There is severe concentric left ventricular hypertrophy. The interventricular septum is flattened in diastole ('D' shaped left ventricle), consistent with right ventricular volume overload. Left ventricular diastolic parameters are consistent with Grade III diastolic dysfunction (restrictive). Right Ventricle: The right ventricular size is moderately enlarged. Mildly increased right ventricular wall thickness. Right ventricular systolic function is moderately reduced. Left Atrium: Left atrial size was severely dilated. Right Atrium: Right atrial size was moderately dilated. Pericardium: There is no evidence of pericardial effusion. Mitral Valve: The mitral valve is grossly normal. There is moderate thickening of the mitral valve leaflet(s). There is moderate calcification of the mitral valve leaflet(s). Moderate mitral annular calcification. Moderate mitral valve regurgitation. Tricuspid Valve: The tricuspid valve is grossly normal. Tricuspid valve regurgitation is moderate to severe. The aortic valve is grossly normal. Aortic valve regurgitation is mild. Aortic valve sclerosis/calcification is present, without any evidence of aortic stenosis. 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 Pulmonic Valve: The pulmonic valve was normal in structure. Pulmonic valve regurgitation is not visualized. Aorta: The aortic root was not well visualized. IAS/Shunts: No atrial level shunt detected by color flow Doppler. Additional Comments: 3D was performed not requiring image post processing on an independent workstation and was indeterminate.  LEFT VENTRICLE PLAX 2D LVIDd:         4.50 cm LVIDs:          3.20 cm LV PW:         1.80 cm LV IVS:        1.80 cm  RIGHT VENTRICLE            IVC RV Basal diam:  4.90 cm    IVC diam: 2.10 cm RV S prime:     6.74 cm/s TAPSE (M-mode): 1.2 cm LEFT ATRIUM             Index        RIGHT ATRIUM           Index LA diam:        5.30 cm 2.52 cm/m   RA Area:     28.30 cm LA Vol (A2C):   94.4 ml 44.84 ml/m  RA Volume:   102.00 ml 48.45 ml/m LA Vol (A4C):   92.0 ml 43.70 ml/m LA Biplane Vol: 97.0 ml 46.08 ml/m  AORTIC VALVE AV Vmax:           302.00 cm/s AV Vmean:          212.200 cm/s AV VTI:            0.447 m AV Peak Grad:      36.5 mmHg AV Mean Grad:      20.6 mmHg LVOT Vmax:         81.03 cm/s  LVOT Vmean:        52.067 cm/s LVOT VTI:          0.135 m LVOT/AV VTI ratio: 0.30 AI PHT:            277 msec  AORTA Ao Root diam: 3.40 cm Ao Asc diam:  3.90 cm MR Peak grad: 112.8 mmHg    TRICUSPID VALVE MR Vmax:      531.00 cm/s   TR Peak grad:   58.7 mmHg MV E velocity: 163.25 cm/s  TR Vmax:        383.00 cm/s                              SHUNTS                             Systemic VTI: 0.14 m Cara JONETTA Lovelace MD Electronically signed by Cara JONETTA Lovelace MD Signature Date/Time: 08/19/2024/12:52:25 PM    Final    DG Hand Complete Left Result Date: 08/19/2024 EXAM: 3 OR MORE VIEW(S) XRAY OF THE LEFT HAND 08/19/2024 12:49:00 AM COMPARISON: 08/18/2024. CLINICAL HISTORY: Swelling. FINDINGS: BONES AND JOINTS: No acute fracture, subluxation or dislocation. No malalignment. Calcifications about the left wrist, likely synovial calcifications. Arthritic changes throughout the left wrist. SOFT TISSUES: Small radiopaque foreign body noted in the soft tissues of the left middle finger anterior to the middle phalanx. IMPRESSION: 1. Small radiopaque foreign body in the soft tissues of the left middle finger anterior to the middle phalanx. 2. No acute fracture, subluxation, or dislocation. Electronically signed by: Franky Crease MD 08/19/2024 01:00 AM EST RP Workstation: [id number]   US  Venous Img  Upper Uni Left (DVT) Result Date: 08/19/2024 CLINICAL DATA:  Swelling EXAM: Left UPPER EXTREMITY VENOUS DOPPLER ULTRASOUND TECHNIQUE: Gray-scale sonography with graded compression, as well as color Doppler and duplex ultrasound were performed to evaluate the upper extremity deep venous system from the level of the subclavian vein and including the jugular, axillary, basilic, radial, ulnar and upper cephalic vein. Spectral Doppler was utilized to evaluate flow at rest and with distal augmentation maneuvers. COMPARISON:  None Available. FINDINGS: Contralateral Subclavian Vein: Respiratory phasicity is normal and symmetric with the symptomatic side. No evidence of thrombus. Internal Jugular Vein: No evidence of thrombus. Normal compressibility, respiratory phasicity and response to augmentation. Subclavian Vein: No evidence of thrombus. Normal respiratory phasicity and response to augmentation. Axillary Vein: No evidence of thrombus. Normal compressibility, respiratory phasicity and response to augmentation. Cephalic Vein: No evidence of thrombus. Normal compressibility, respiratory phasicity and response to augmentation. Basilic Vein: No evidence of thrombus. Normal compressibility, respiratory phasicity and response to augmentation. Brachial Veins: No evidence of thrombus. Normal compressibility, respiratory phasicity and response to augmentation. Radial Veins: No evidence of thrombus. Normal compressibility, respiratory phasicity and response to augmentation. Ulnar Veins: No evidence of thrombus. Normal compressibility, respiratory phasicity and response to augmentation. IMPRESSION: No evidence of DVT within the left upper extremity. Electronically Signed   By: Luke Bun M.D.   On: 08/19/2024 00:31   DG Shoulder Left Result Date: 08/18/2024 EXAM: 1 VIEW(S) XRAY OF THE LEFT SHOULDER 08/18/2024 07:58:00 PM COMPARISON: None available. CLINICAL HISTORY: Shoulder pain, left. FINDINGS: BONES AND JOINTS: Glenohumeral  joint is normally aligned. No acute fracture. No malalignment. 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 SOFT TISSUES: No abnormal calcifications. Visualized lung is unremarkable. IMPRESSION: 1. Mild osteoarthritis of  the glenohumeral and acromioclavicular joints. Electronically signed by: Elsie Gravely MD 08/18/2024 08:12 PM EST RP Workstation: [id number]   DG Hand 2 View Left Result Date: 08/18/2024 EXAM: 1 or 2 VIEW(S) XRAY OF THE LEFT HAND 08/18/2024 07:58:00 PM COMPARISON: None available. CLINICAL HISTORY: Pain FINDINGS: BONES AND JOINTS: Prominent degenerative changes in the interphalangeal joints, first carpometacarpal and metacarpophalangeal joints, radiocarpal, radioulnar, and intercarpal joints. Old healed fracture deformity of the distal radius. Subcortical cyst in the base of the 1st metacarpal, likely degenerative cyst. SOFT TISSUES: Soft tissue calcifications in and around the wrist likely representing synovial calcifications. Dorsal soft tissue swelling. Tiny metallic foreign body suggested in the soft tissues of the third finger adjacent to the middle phalanx. IMPRESSION: 1. Dorsal soft tissue swelling. 2. Tiny metallic foreign body suggested in the soft tissues of the third finger adjacent to the middle phalanx. 3. Prominent degenerative changes in the interphalangeal joints, first carpometacarpal and metacarpophalangeal joints, radiocarpal, radioulnar, and intercarpal joints. 4. Soft tissue calcifications in and around the wrist, likely representing synovial calcifications. 5. Old healed fracture deformity of the distal radius. Electronically signed by: Elsie Gravely MD 08/18/2024 08:11 PM EST RP Workstation: [id number]   DG Chest Portable 1 View Result Date: 08/18/2024 EXAM: 1 VIEW(S) XRAY OF THE CHEST 08/18/2024 04:53:00 PM COMPARISON: 08/10/2024 CLINICAL HISTORY: SOB FINDINGS: LUNGS AND PLEURA: Moderate right pleural effusion, stable compared to prior. Small  left pleural effusion. Mild diffuse interstitial prominence. Bibasilar airspace opacities, likely compressive atelectasis. No pneumothorax. HEART AND MEDIASTINUM: Enlarged cardiomediastinal silhouette, unchanged. Median sternotomy and TAVR noted. Atherosclerotic calcifications. BONES AND SOFT TISSUES: No acute osseous abnormality. IMPRESSION: 1. Moderate right pleural effusion and small left pleural effusion. 2. Mild diffuse interstitial prominence and bibasilar airspace opacities, likely compressive atelectasis. Electronically signed by: Greig Pique MD 08/18/2024 05:32 PM EST RP Workstation: [id number]     Assessment/Plan: MSSA bacteremia Has TAVR On cefazolin  Need/TEE to r/o prosthetic valve endocarditis Continue cefazolin  may need upto 6 weeks Repeat blood cultures   Swollen and tender left MCP J Thumb - improving Rt shoulder pain - improving On Diluadid which is making him somnolent as well R/o septic arthritis D.D gout/pseudogout  Seen by ortho, who thinks it is not septic arthritis-    AKI on CKD worsening   PAF Eliquis  had been DC due to GI bleed recently     CHF Recurrent pleural effusion Pulmonary hypertension   CAD s/p CABG and stent Was on clopidogrel  but DC due to GI bleed   Anemia H/o GI bleed  Discussed the management with patient, son at bed side and care team  "

## 2024-08-20 NOTE — Plan of Care (Signed)
 " Problem: Education: Goal: Ability to describe self-care measures that may prevent or decrease complications (Diabetes Survival Skills Education) will improve 08/20/2024 0153 by Alphonzo Sari SAILOR, RN Outcome: Progressing 08/20/2024 0152 by Alphonzo Sari SAILOR, RN Outcome: Progressing Goal: Individualized Educational Video(s) 08/20/2024 0153 by Alphonzo Sari SAILOR, RN Outcome: Progressing 08/20/2024 0152 by Alphonzo Sari SAILOR, RN Outcome: Progressing   Problem: Coping: Goal: Ability to adjust to condition or change in health will improve 08/20/2024 0153 by Alphonzo Sari SAILOR, RN Outcome: Progressing 08/20/2024 0152 by Alphonzo Sari SAILOR, RN Outcome: Progressing   Problem: Fluid Volume: Goal: Ability to maintain a balanced intake and output will improve 08/20/2024 0153 by Alphonzo Sari SAILOR, RN Outcome: Progressing 08/20/2024 0152 by Alphonzo Sari SAILOR, RN Outcome: Progressing   Problem: Health Behavior/Discharge Planning: Goal: Ability to identify and utilize available resources and services will improve 08/20/2024 0153 by Alphonzo Sari SAILOR, RN Outcome: Progressing 08/20/2024 0152 by Alphonzo Sari SAILOR, RN Outcome: Progressing Goal: Ability to manage health-related needs will improve 08/20/2024 0153 by Alphonzo Sari SAILOR, RN Outcome: Progressing 08/20/2024 0152 by Alphonzo Sari SAILOR, RN Outcome: Progressing   Problem: Metabolic: Goal: Ability to maintain appropriate glucose levels will improve 08/20/2024 0153 by Alphonzo Sari SAILOR, RN Outcome: Progressing 08/20/2024 0152 by Alphonzo Sari SAILOR, RN Outcome: Progressing   Problem: Nutritional: Goal: Maintenance of adequate nutrition will improve 08/20/2024 0153 by Alphonzo Sari SAILOR, RN Outcome: Progressing 08/20/2024 0152 by Alphonzo Sari SAILOR, RN Outcome: Progressing Goal: Progress toward achieving an optimal weight will improve 08/20/2024 0153 by Alphonzo Sari SAILOR, RN Outcome: Progressing 08/20/2024 0152 by Alphonzo Sari SAILOR, RN Outcome: Progressing   Problem: Skin Integrity: Goal: Risk for  impaired skin integrity will decrease 08/20/2024 0153 by Alphonzo Sari SAILOR, RN Outcome: Progressing 08/20/2024 0152 by Alphonzo Sari SAILOR, RN Outcome: Progressing   Problem: Tissue Perfusion: Goal: Adequacy of tissue perfusion will improve 08/20/2024 0153 by Alphonzo Sari SAILOR, RN Outcome: Progressing 08/20/2024 0152 by Alphonzo Sari SAILOR, RN Outcome: Progressing   Problem: Education: Goal: Knowledge of General Education information will improve Description: Including pain rating scale, medication(s)/side effects and non-pharmacologic comfort measures 08/20/2024 0153 by Alphonzo Sari SAILOR, RN Outcome: Progressing 08/20/2024 0152 by Alphonzo Sari SAILOR, RN Outcome: Progressing   Problem: Health Behavior/Discharge Planning: Goal: Ability to manage health-related needs will improve 08/20/2024 0153 by Alphonzo Sari SAILOR, RN Outcome: Progressing 08/20/2024 0152 by Alphonzo Sari SAILOR, RN Outcome: Progressing   Problem: Clinical Measurements: Goal: Ability to maintain clinical measurements within normal limits will improve 08/20/2024 0153 by Alphonzo Sari SAILOR, RN Outcome: Progressing 08/20/2024 0152 by Alphonzo Sari SAILOR, RN Outcome: Progressing Goal: Will remain free from infection 08/20/2024 0153 by Alphonzo Sari SAILOR, RN Outcome: Progressing 08/20/2024 0152 by Alphonzo Sari SAILOR, RN Outcome: Progressing Goal: Diagnostic test results will improve 08/20/2024 0153 by Alphonzo Sari SAILOR, RN Outcome: Progressing 08/20/2024 0152 by Alphonzo Sari SAILOR, RN Outcome: Progressing Goal: Respiratory complications will improve 08/20/2024 0153 by Alphonzo Sari SAILOR, RN Outcome: Progressing 08/20/2024 0152 by Alphonzo Sari SAILOR, RN Outcome: Progressing Goal: Cardiovascular complication will be avoided 08/20/2024 0153 by Alphonzo Sari SAILOR, RN Outcome: Progressing 08/20/2024 0152 by Alphonzo Sari SAILOR, RN Outcome: Progressing   Problem: Activity: Goal: Risk for activity intolerance will decrease 08/20/2024 0153 by Alphonzo Sari SAILOR, RN Outcome: Progressing 08/20/2024 0152  by Alphonzo Sari SAILOR, RN Outcome: Progressing   Problem: Nutrition: Goal: Adequate nutrition will be maintained 08/20/2024 0153 by Alphonzo Sari SAILOR, RN Outcome: Progressing 08/20/2024 0152 by Alphonzo Sari SAILOR, RN Outcome: Progressing  Problem: Coping: Goal: Level of anxiety will decrease 08/20/2024 0153 by Alphonzo Sari SAILOR, RN Outcome: Progressing 08/20/2024 0152 by Alphonzo Sari SAILOR, RN Outcome: Progressing   Problem: Elimination: Goal: Will not experience complications related to bowel motility 08/20/2024 0153 by Alphonzo Sari SAILOR, RN Outcome: Progressing 08/20/2024 0152 by Alphonzo Sari SAILOR, RN Outcome: Progressing Goal: Will not experience complications related to urinary retention 08/20/2024 0153 by Alphonzo Sari SAILOR, RN Outcome: Progressing 08/20/2024 0152 by Alphonzo Sari SAILOR, RN Outcome: Progressing   Problem: Pain Managment: Goal: General experience of comfort will improve and/or be controlled 08/20/2024 0153 by Alphonzo Sari SAILOR, RN Outcome: Progressing 08/20/2024 0152 by Alphonzo Sari SAILOR, RN Outcome: Progressing   Problem: Safety: Goal: Ability to remain free from injury will improve 08/20/2024 0153 by Alphonzo Sari SAILOR, RN Outcome: Progressing 08/20/2024 0152 by Alphonzo Sari SAILOR, RN Outcome: Progressing   Problem: Skin Integrity: Goal: Risk for impaired skin integrity will decrease 08/20/2024 0153 by Alphonzo Sari SAILOR, RN Outcome: Progressing 08/20/2024 0152 by Alphonzo Sari SAILOR, RN Outcome: Progressing   Problem: Clinical Measurements: Goal: Ability to avoid or minimize complications of infection will improve 08/20/2024 0153 by Alphonzo Sari SAILOR, RN Outcome: Progressing 08/20/2024 0152 by Alphonzo Sari SAILOR, RN Outcome: Progressing   Problem: Skin Integrity: Goal: Skin integrity will improve 08/20/2024 0153 by Alphonzo Sari SAILOR, RN Outcome: Progressing 08/20/2024 0152 by Alphonzo Sari SAILOR, RN Outcome: Progressing   "

## 2024-08-20 NOTE — Plan of Care (Signed)
" °  Problem: Education: Goal: Ability to describe self-care measures that may prevent or decrease complications (Diabetes Survival Skills Education) will improve Outcome: Not Progressing Goal: Individualized Educational Video(s) Outcome: Not Progressing   Problem: Coping: Goal: Ability to adjust to condition or change in health will improve Outcome: Not Progressing   Problem: Fluid Volume: Goal: Ability to maintain a balanced intake and output will improve Outcome: Progressing   Problem: Health Behavior/Discharge Planning: Goal: Ability to identify and utilize available resources and services will improve Outcome: Not Progressing Goal: Ability to manage health-related needs will improve Outcome: Not Progressing   Problem: Metabolic: Goal: Ability to maintain appropriate glucose levels will improve Outcome: Progressing   Problem: Nutritional: Goal: Maintenance of adequate nutrition will improve Outcome: Not Progressing Goal: Progress toward achieving an optimal weight will improve Outcome: Not Progressing   Problem: Skin Integrity: Goal: Risk for impaired skin integrity will decrease Outcome: Not Progressing   Problem: Tissue Perfusion: Goal: Adequacy of tissue perfusion will improve Outcome: Not Progressing   Problem: Education: Goal: Knowledge of General Education information will improve Description: Including pain rating scale, medication(s)/side effects and non-pharmacologic comfort measures Outcome: Not Progressing   Problem: Health Behavior/Discharge Planning: Goal: Ability to manage health-related needs will improve Outcome: Not Progressing   Problem: Clinical Measurements: Goal: Ability to maintain clinical measurements within normal limits will improve Outcome: Progressing Goal: Will remain free from infection Outcome: Progressing Goal: Diagnostic test results will improve Outcome: Not Progressing Goal: Respiratory complications will improve Outcome:  Progressing Goal: Cardiovascular complication will be avoided Outcome: Not Progressing   Problem: Activity: Goal: Risk for activity intolerance will decrease Outcome: Not Progressing   Problem: Nutrition: Goal: Adequate nutrition will be maintained Outcome: Not Progressing   Problem: Coping: Goal: Level of anxiety will decrease Outcome: Progressing   Problem: Elimination: Goal: Will not experience complications related to bowel motility Outcome: Not Progressing Goal: Will not experience complications related to urinary retention Outcome: Progressing   Problem: Pain Managment: Goal: General experience of comfort will improve and/or be controlled Outcome: Progressing   Problem: Safety: Goal: Ability to remain free from injury will improve Outcome: Progressing   Problem: Skin Integrity: Goal: Risk for impaired skin integrity will decrease Outcome: Not Progressing   Problem: Clinical Measurements: Goal: Ability to avoid or minimize complications of infection will improve Outcome: Progressing   Problem: Skin Integrity: Goal: Skin integrity will improve Outcome: Progressing   "

## 2024-08-20 NOTE — TOC Initial Note (Addendum)
 Transition of Care [facility]) - Initial/Assessment Note    Patient Details  Name: [name] MRN: [id number] Date of Birth: [birth year]  Transition of Care [facility]) CM/SW Contact:    [name], RN Phone Number: 08/20/2024, 3:09 PM  Clinical Narrative:                 RNCM met with patient and son. Introduced self and role. Patient says he lives at home with his wife in a single wide trailer. Son and his family live in adjoining property. Family members will drive patient to appointments. [facility]ses [facility] for [facility]. No difficulty affording medications. Uses 4L O2 at baseline. Endorses having walker, cane, rollator in home.  Advised patient and son that team has recommended patient go to SNF for rehab before going home. Patient is agreeable to short term rehab. Answer questions from patient and son. Advised I would begin a search and let them know what offers we get. Patient said he has never been in a facility before.  PASRR: [id number]  Expected Discharge Plan: Skilled Nursing Facility Barriers to Discharge: Continued Medical Work up   Patient Goals and CMS Choice            Expected Discharge Plan and Services       Living arrangements for the past 2 months: Mobile Home                                      Prior Living Arrangements/Services Living arrangements for the past 2 months: Mobile Home Lives with:: Spouse Patient language and need for interpreter reviewed:: Yes        Need for Family Participation in Patient Care: Yes (Comment) Care giver support system in place?: Yes (comment) Current home services: DME Criminal Activity/Legal Involvement Pertinent to Current Situation/Hospitalization: No - Comment as needed  Activities of Daily Living      Permission Sought/Granted Permission sought to share information with : Case Manager, Magazine Features Editor Permission granted to share information with : Yes, Verbal Permission Granted               Emotional Assessment Appearance:: Appears stated age Attitude/Demeanor/Rapport: Engaged Affect (typically observed): Accepting Orientation: : Oriented to Self, Oriented to Place, Oriented to  Time, Oriented to Situation Alcohol  / Substance Use: Not Applicable Psych Involvement: No (comment)  Admission diagnosis:  Failure to thrive in adult [R62.7] Fall in home, initial encounter [W19.XXXA, Y92.009] 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] Acute on chronic congestive heart failure, unspecified heart failure type [facility]) [I50.9] Patient Active Problem List   Diagnosis Date Noted   Bacteremia due to methicillin susceptible Staphylococcus aureus (MSSA) 08/19/2024   History of transcatheter aortic valve replacement (TAVR) 08/19/2024   Failure to thrive in adult 08/18/2024   Acute heart failure with preserved ejection fraction (HCC) 08/11/2024   Lymphadenopathy 07/21/2024   Acute on chronic heart failure with preserved ejection fraction (HCC) 07/19/2024   Possible fluid overload due to blood transfusion (11/20. 11/22, 07/16/2024) 07/18/2024   Anemia in chronic kidney disease 04/14/2024   Other specified anemias 03/27/2024   Chest pain 03/02/2024   Diabetes mellitus type 2, noninsulin dependent (HCC) 03/02/2024   CKD (chronic kidney disease), stage IV (HCC) 03/02/2024   Respiratory arrest (HCC) 03/02/2024   Unresponsiveness 03/02/2024   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/2024   IDA (iron  deficiency anemia) 12/16/2023  Left ventricular hypertrophy 12/16/2023   Thrombocytopenia 12/16/2023   Stage 4 chronic kidney disease (HCC) 12/16/2023   Anemia of chronic disease 12/16/2023   Pleural effusion, bilateral 12/14/2023   Chronic diastolic CHF (congestive heart failure) (HCC) 11/12/2023   Murmur, cardiac 11/12/2023   Polyp of transverse colon 10/19/2023   Gastric polyps 10/19/2023   Acute exacerbation of CHF (congestive heart failure) (HCC) 10/16/2023    Severe anemia 10/16/2023   Dyspnea on exertion 10/16/2023   Personal history of other malignant neoplasm of skin 10/16/2023   Moderate to severe mitral and tricuspid regurgitation 09/15/2023   Melena 08/26/2023   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   CHF exacerbation (HCC) 08/22/2023   Upper GI bleed 08/22/2023   Recurrent pleural effusion on right 08/22/2023   Symptomatic anemia 08/22/2023   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/01/2023   Status post Mohs surgery 05/01/2023   Right inguinal hernia 02/19/2023   Umbilical hernia without obstruction and without gangrene 02/19/2023   Hyponatremia 12/26/2022   Acute kidney injury superimposed on stage 3b chronic kidney disease (HCC) 12/26/2022   Severe pulmonary hypertension (HCC) 12/26/2022   Vitamin D  deficiency 12/26/2022   Acute hypoxic respiratory failure (HCC) 12/26/2022   Moderate mitral regurgitation 12/26/2022   Severe tricuspid regurgitation 12/26/2022   Acute heart failure with preserved ejection fraction (HFpEF) (HCC) 12/20/2022   Chronic venous stasis 12/20/2022   Paroxysmal atrial fibrillation (HCC) 12/20/2022   Iron  deficiency anemia 05/17/2021   Thyroid  nodule 11/04/2020   Lymphedema 10/27/2020   Gastric mass    Polyp of sigmoid colon    Acute blood loss anemia 09/04/2020   COVID-19 virus infection    Hypercalcemia    Pica    Acute on chronic blood loss anemia 09/02/2020   Hyperlipidemia associated with type 2 diabetes mellitus (HCC) 09/02/2020   S/P drug eluting coronary stent placement 06/09/2020   Other hyperlipidemia 08/18/2019   T2DM (type 2 diabetes mellitus) (HCC) 01/14/2017   Essential hypertension 01/14/2017   OSA (obstructive sleep apnea) 01/14/2017   At risk for fluid volume overload 09/08/2016   BBB (bundle branch block) 08/30/2016   Obesity with body mass index (BMI) of 30.0 to 39.9 08/30/2016   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   1st degree AV block 08/30/2016   CAD (coronary artery  disease), s/p CABG x 3 08/20/2016   History of colonic polyps 11/05/2013   PCP:  [name], MD Pharmacy:   [phone] [place] [place] [facility] [address] [place] [address] Phone: [phone] Fax: [phone]  [facility] [address] [place] [address] Phone: [phone] Fax: [phone]     Social Drivers of Health (SDOH) Social History: SDOH Screenings   Food Insecurity: No Food Insecurity (08/20/2024)  Housing: Low Risk (08/20/2024)  Transportation Needs: No Transportation Needs (08/20/2024)  Utilities: Not At Risk (08/20/2024)  Depression (PHQ2-9): Low Risk (07/16/2024)  Financial Resource Strain: Low Risk  (05/08/2024)   Received from [facility]  Social Connections: Unknown (08/20/2024)  Recent Concern: Social Connections - Moderately Isolated (07/18/2024)  Tobacco Use: Medium Risk (08/10/2024)   SDOH Interventions:     Readmission Risk Interventions    08/20/2024    3:06 PM 08/12/2024    1:40 PM 07/20/2024   12:29 PM  Readmission Risk Prevention Plan  Transportation Screening Complete Complete Complete  PCP or Specialist Appt within 3-5 Days   --  HRI or Home  Care Consult   Complete  Social Work Consult for Recovery Care Planning/Counseling   Complete  Medication Review Oceanographer) Complete Complete Complete  PCP or Specialist appointment within 3-5 days of discharge Complete Complete   HRI or Home Care Consult Complete Complete   SW Recovery Care/Counseling Consult  Complete   Palliative Care Screening Not Applicable Not Applicable   Skilled Nursing Facility Complete Complete

## 2024-08-21 ENCOUNTER — Inpatient Hospital Stay

## 2024-08-21 DIAGNOSIS — I5031 Acute diastolic (congestive) heart failure: Secondary | ICD-10-CM | POA: Diagnosis not present

## 2024-08-21 DIAGNOSIS — R627 Adult failure to thrive: Secondary | ICD-10-CM | POA: Diagnosis not present

## 2024-08-21 DIAGNOSIS — I25118 Atherosclerotic heart disease of native coronary artery with other forms of angina pectoris: Secondary | ICD-10-CM

## 2024-08-21 DIAGNOSIS — Z515 Encounter for palliative care: Secondary | ICD-10-CM | POA: Diagnosis not present

## 2024-08-21 DIAGNOSIS — N184 Chronic kidney disease, stage 4 (severe): Secondary | ICD-10-CM | POA: Diagnosis not present

## 2024-08-21 DIAGNOSIS — I509 Heart failure, unspecified: Secondary | ICD-10-CM | POA: Diagnosis not present

## 2024-08-21 DIAGNOSIS — B9561 Methicillin susceptible Staphylococcus aureus infection as the cause of diseases classified elsewhere: Secondary | ICD-10-CM | POA: Diagnosis not present

## 2024-08-21 DIAGNOSIS — R7881 Bacteremia: Secondary | ICD-10-CM | POA: Diagnosis not present

## 2024-08-21 DIAGNOSIS — N179 Acute kidney failure, unspecified: Secondary | ICD-10-CM | POA: Diagnosis not present

## 2024-08-21 DIAGNOSIS — D631 Anemia in chronic kidney disease: Secondary | ICD-10-CM | POA: Diagnosis not present

## 2024-08-21 DIAGNOSIS — I340 Nonrheumatic mitral (valve) insufficiency: Secondary | ICD-10-CM | POA: Diagnosis not present

## 2024-08-21 LAB — BASIC METABOLIC PANEL WITH GFR
Anion gap: 9 (ref 5–15)
BUN: 95 mg/dL — ABNORMAL HIGH (ref 8–23)
CO2: 30 mmol/L (ref 22–32)
Calcium: 9.1 mg/dL (ref 8.9–10.3)
Chloride: 94 mmol/L — ABNORMAL LOW (ref 98–111)
Creatinine, Ser: 3.07 mg/dL — ABNORMAL HIGH (ref 0.61–1.24)
GFR, Estimated: 20 mL/min — ABNORMAL LOW
Glucose, Bld: 207 mg/dL — ABNORMAL HIGH (ref 70–99)
Potassium: 4.4 mmol/L (ref 3.5–5.1)
Sodium: 133 mmol/L — ABNORMAL LOW (ref 135–145)

## 2024-08-21 LAB — TYPE AND SCREEN
ABO/RH(D): A POS
Antibody Screen: NEGATIVE
Unit division: 0

## 2024-08-21 LAB — CBC
HCT: 23 % — ABNORMAL LOW (ref 39.0–52.0)
Hemoglobin: 7.5 g/dL — ABNORMAL LOW (ref 13.0–17.0)
MCH: 31.1 pg (ref 26.0–34.0)
MCHC: 32.6 g/dL (ref 30.0–36.0)
MCV: 95.4 fL (ref 80.0–100.0)
Platelets: 78 K/uL — ABNORMAL LOW (ref 150–400)
RBC: 2.41 MIL/uL — ABNORMAL LOW (ref 4.22–5.81)
RDW: 17.2 % — ABNORMAL HIGH (ref 11.5–15.5)
WBC: 8.8 K/uL (ref 4.0–10.5)
nRBC: 0 % (ref 0.0–0.2)

## 2024-08-21 LAB — BPAM RBC
Blood Product Expiration Date: 202601192359
ISSUE DATE / TIME: 202601081112
Unit Type and Rh: 600

## 2024-08-21 LAB — GLUCOSE, CAPILLARY
Glucose-Capillary: 179 mg/dL — ABNORMAL HIGH (ref 70–99)
Glucose-Capillary: 131 mg/dL — ABNORMAL HIGH (ref 70–99)
Glucose-Capillary: 203 mg/dL — ABNORMAL HIGH (ref 70–99)
Glucose-Capillary: 167 mg/dL — ABNORMAL HIGH (ref 70–99)

## 2024-08-21 LAB — CULTURE, BLOOD (ROUTINE X 2): Special Requests: ADEQUATE

## 2024-08-21 MED ORDER — HYDROMORPHONE HCL 2 MG PO TABS
1.0000 mg | ORAL_TABLET | ORAL | Status: DC | PRN
  Administered 2024-08-21: 1 mg via ORAL
  Filled 2024-08-21: qty 1

## 2024-08-21 MED ORDER — MORPHINE SULFATE (PF) 2 MG/ML IV SOLN
2.0000 mg | INTRAVENOUS | Status: DC | PRN
  Administered 2024-08-22 (×5): 2 mg via INTRAVENOUS
  Filled 2024-08-21 (×5): qty 1

## 2024-08-21 MED ORDER — HYDROMORPHONE HCL 1 MG/ML IJ SOLN
0.2500 mg | INTRAMUSCULAR | Status: DC | PRN
  Administered 2024-08-21 (×2): 0.25 mg via INTRAVENOUS
  Filled 2024-08-21 (×2): qty 0.5

## 2024-08-21 NOTE — Progress Notes (Addendum)
 " [facility]  ROUNDING NOTE   Subjective:   [name] is a 79 year old male with past medical conditions including hypertension, CAD status post CABG, type 2 diabetes, hypertension, severe pulmonary hypertension, and chronic kidney disease stage IIIb.  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    Patient laying in bed Son at bedside Denies pain NPO    Objective:  Vital signs in last 24 hours:  Temp:  [97.4 F (36.3 C)-97.9 F (36.6 C)] 97.9 F (36.6 C) (01/09 1212) Pulse Rate:  [66-79] 70 (01/09 1212) Resp:  [15-22] 15 (01/09 1212) BP: (99-178)/(55-93) 99/60 (01/09 1212) SpO2:  [93 %-100 %] 98 % (01/09 1212) Weight:  [93.8 kg] 93.8 kg (01/09 0500)  Weight change: 8.2 kg Filed Weights   08/18/24 2037 08/20/24 0500 08/21/24 0500  Weight: 86.2 kg 85.6 kg 93.8 kg    Intake/Output: I/O last 3 completed shifts: In: 826 [P.O.:220; Blood:386; Other:120; IV Piggyback:100] Out: 950 [Urine:950]   Intake/Output this shift:  No intake/output data recorded.  Physical Exam: General: NAD  Head: Normocephalic, atraumatic.  Dry oral mucosal membranes  Eyes: Anicteric  Lungs:  Wheeze throughout, [place] O2  Heart: Regular rate and rhythm  Abdomen:  Soft, nontender  Extremities: 2+ peripheral edema.  Neurologic: Awake, alert, conversant  Skin: Warm,dry, no rash       Basic Metabolic Panel: Recent Labs  Lab 08/15/24 0427 08/18/24 1641 08/19/24 0425 08/20/24 0420 08/21/24 0448  NA 139 136 136 135 133*  K 3.9 4.9 4.8 4.8 4.4  CL 99 94* 94* 93* 94*  CO2 34* 30 32 30 30  GLUCOSE 153* 203* 211*  206* 207*  BUN 46* 69* 76* 88* 95*  CREATININE 1.87* 2.53* 2.66* 3.05* 3.07*  CALCIUM  10.3 11.2* 10.4* 9.9 9.1  MG  --  2.0  --   --   --     Liver Function Tests: Recent Labs  Lab 08/18/24 1641 08/19/24 0425  AST 31 23  ALT 18 15  ALKPHOS 134* 115  BILITOT 2.5* 1.9*  PROT 7.4 6.6  ALBUMIN 3.4* 3.0*   No results for input(s): LIPASE, AMYLASE in the last 168 hours. No results for input(s): AMMONIA in the last 168 hours.  CBC: Recent Labs  Lab 08/15/24 0427 08/18/24 1641 08/19/24 0425 08/20/24 0420 08/21/24 0448  WBC 11.3* 16.9* 14.8* 11.2* 8.8  NEUTROABS  --  15.7*  --   --   --   HGB 8.1* 8.6* 8.5* 7.2* 7.5*  HCT 25.6* 27.4* 27.1* 22.9* 23.0*  MCV 101.2* 100.0 100.4* 98.7 95.4  PLT 112* 86* 71* 78* 78*    Cardiac Enzymes: No results for input(s): CKTOTAL, CKMB, CKMBINDEX, TROPONINI in the last 168 hours.  BNP: Invalid input(s): POCBNP  CBG: Recent Labs  Lab 08/20/24 1253 08/20/24 1642 08/20/24 2206 08/21/24 0734 08/21/24 1201  GLUCAP 183* 116* 194* 179* 131*    Microbiology: Results for orders placed or performed during the Fitzgerald encounter of 08/18/24  Resp panel by RT-PCR (RSV, Flu A&B, Covid) Anterior Nasal Swab     Status: None   Collection Time: 08/18/24  4:41 PM   Specimen: Anterior Nasal Swab  Result  Value Ref Range Status   SARS Coronavirus 2 by RT PCR NEGATIVE NEGATIVE Final    Comment: (NOTE) SARS-CoV-2 target nucleic acids are NOT DETECTED.  The SARS-CoV-2 RNA is generally detectable in upper respiratory specimens during the acute phase of infection. The lowest concentration of SARS-CoV-2 viral copies this assay can detect is 138 copies/mL. A negative result does not preclude SARS-Cov-2 infection and should not be used as the sole basis for treatment or other patient management decisions. A negative result may occur with  improper specimen collection/handling, submission of specimen other than nasopharyngeal swab,  presence of viral mutation(s) within the areas targeted by this assay, and inadequate number of viral copies(<138 copies/mL). A negative result must be combined with clinical observations, patient history, and epidemiological information. The expected result is Negative.  Fact Sheet for Patients:  bloggercourse.com  Fact Sheet for Healthcare Providers:  seriousbroker.it  This test is no t yet approved or cleared by the [place]  FDA and  has been authorized for detection and/or diagnosis of SARS-CoV-2 by FDA under an Emergency Use Authorization (EUA). This EUA will remain  in effect (meaning this test can be used) for the duration of the COVID-19 declaration under Section 564(b)(1) of the Act, 21 U.S.C.section 360bbb-3(b)(1), unless the authorization is terminated  or revoked sooner.       Influenza A by PCR NEGATIVE NEGATIVE Final   Influenza B by PCR NEGATIVE NEGATIVE Final    Comment: (NOTE) The Xpert Xpress SARS-CoV-2/FLU/RSV plus assay is intended as an aid in the diagnosis of influenza from Nasopharyngeal swab specimens and should not be used as a sole basis for treatment. Nasal washings and aspirates are unacceptable for Xpert Xpress SARS-CoV-2/FLU/RSV testing.  Fact Sheet for Patients: bloggercourse.com  Fact Sheet for Healthcare Providers: seriousbroker.it  This test is not yet approved or cleared by the [place]  FDA and has been authorized for detection and/or diagnosis of SARS-CoV-2 by FDA under an Emergency Use Authorization (EUA). This EUA will remain in effect (meaning this test can be used) for the duration of the COVID-19 declaration under Section 564(b)(1) of the Act, 21 U.S.C. section 360bbb-3(b)(1), unless the authorization is terminated or revoked.     Resp Syncytial Virus by PCR NEGATIVE NEGATIVE Final    Comment: (NOTE) Fact Sheet for  Patients: bloggercourse.com  Fact Sheet for Healthcare Providers: seriousbroker.it  This test is not yet approved or cleared by the [place]  FDA and has been authorized for detection and/or diagnosis of SARS-CoV-2 by FDA under an Emergency Use Authorization (EUA). This EUA will remain in effect (meaning this test can be used) for the duration of the COVID-19 declaration under Section 564(b)(1) of the Act, 21 U.S.C. section 360bbb-3(b)(1), unless the authorization is terminated or revoked.  Performed at [facility], [address] [name], [name] [address]   Culture, blood (Routine X 2) w Reflex to ID Panel     Status: Abnormal   Collection Time: 08/18/24 10:33 PM   Specimen: BLOOD  Result Value Ref Range Status   Specimen Description   Final    BLOOD RIGHT ANTECUBITAL Performed at [name], [address] [name], [name] [address]    Special Requests   Final    BOTTLES DRAWN AEROBIC AND ANAEROBIC Blood Culture adequate volume Performed at [facility], [address], [place], [name] [address]    Culture  Setup Time   Final    GRAM POSITIVE COCCI IN BOTH AEROBIC AND ANAEROBIC BOTTLES CRITICAL  RESULT CALLED TO, READ BACK BY AND VERIFIED WITH: ESTILL LUTES 08/19/24 0915 MW Performed at [facility], [address], [name], [name] [address]    Culture STAPHYLOCOCCUS AUREUS (A)  Final   Report Status 08/21/2024 FINAL  Final   Organism ID, Bacteria STAPHYLOCOCCUS AUREUS  Final      Susceptibility   Staphylococcus aureus - MIC*    CIPROFLOXACIN <=0.5 SENSITIVE Sensitive     ERYTHROMYCIN RESISTANT Resistant     GENTAMICIN <=0.5 SENSITIVE Sensitive     OXACILLIN <=0.25 SENSITIVE Sensitive     TETRACYCLINE >=16 RESISTANT Resistant     VANCOMYCIN 1 SENSITIVE Sensitive     TRIMETH/SULFA <=10 SENSITIVE Sensitive     CLINDAMYCIN RESISTANT Resistant     RIFAMPIN <=0.5 SENSITIVE  Sensitive     Inducible Clindamycin POSITIVE Resistant     LINEZOLID 2 SENSITIVE Sensitive     * STAPHYLOCOCCUS AUREUS  Culture, blood (Routine X 2) w Reflex to ID Panel     Status: None (Preliminary result)   Collection Time: 08/18/24 10:33 PM   Specimen: BLOOD  Result Value Ref Range Status   Specimen Description BLOOD BLOOD RIGHT ARM  Final   Special Requests   Final    BOTTLES DRAWN AEROBIC AND ANAEROBIC Blood Culture adequate volume   Culture   Final    NO GROWTH 2 DAYS Performed at [name], [address] [place], [name] [address]    Report Status PENDING  Incomplete  Blood Culture ID Panel (Reflexed)     Status: Abnormal   Collection Time: 08/18/24 10:33 PM  Result Value Ref Range Status   Enterococcus faecalis NOT DETECTED NOT DETECTED Final   Enterococcus Faecium NOT DETECTED NOT DETECTED Final   Listeria monocytogenes NOT DETECTED NOT DETECTED Final   Staphylococcus species DETECTED (A) NOT DETECTED Final    Comment: CRITICAL RESULT CALLED TO, READ BACK BY AND VERIFIED WITH: SHEEMA HALLAJI 08/19/24 0915 MW    Staphylococcus aureus (BCID) DETECTED (A) NOT DETECTED Final    Comment: CRITICAL RESULT CALLED TO, READ BACK BY AND VERIFIED WITH: SHEEMA HALLAJI 08/19/24 0915 MW    Staphylococcus epidermidis NOT DETECTED NOT DETECTED Final   Staphylococcus lugdunensis NOT DETECTED NOT DETECTED Final   Streptococcus species NOT DETECTED NOT DETECTED Final   Streptococcus agalactiae NOT DETECTED NOT DETECTED Final   Streptococcus pneumoniae NOT DETECTED NOT DETECTED Final   Streptococcus pyogenes NOT DETECTED NOT DETECTED Final   A.calcoaceticus-baumannii NOT DETECTED NOT DETECTED Final   Bacteroides fragilis NOT DETECTED NOT DETECTED Final   Enterobacterales NOT DETECTED NOT DETECTED Final   Enterobacter cloacae complex NOT DETECTED NOT DETECTED Final   Escherichia coli NOT DETECTED NOT DETECTED Final   Klebsiella aerogenes NOT DETECTED NOT DETECTED Final    Klebsiella oxytoca NOT DETECTED NOT DETECTED Final   Klebsiella pneumoniae NOT DETECTED NOT DETECTED Final   Proteus species NOT DETECTED NOT DETECTED Final   Salmonella species NOT DETECTED NOT DETECTED Final   Serratia marcescens NOT DETECTED NOT DETECTED Final   Haemophilus influenzae NOT DETECTED NOT DETECTED Final   Neisseria meningitidis NOT DETECTED NOT DETECTED Final   Pseudomonas aeruginosa NOT DETECTED NOT DETECTED Final   Stenotrophomonas maltophilia NOT DETECTED NOT DETECTED Final   Candida albicans NOT DETECTED NOT DETECTED Final   Candida auris NOT DETECTED NOT DETECTED Final   Candida glabrata NOT DETECTED NOT DETECTED Final   Candida krusei NOT DETECTED NOT DETECTED Final   Candida parapsilosis NOT DETECTED NOT DETECTED  Final   Candida tropicalis NOT DETECTED NOT DETECTED Final   Cryptococcus neoformans/gattii NOT DETECTED NOT DETECTED Final   Meth resistant mecA/C and MREJ NOT DETECTED NOT DETECTED Final    Comment: Performed at [facility], [address] [place], [name] [address]  Culture, blood (Routine X 2) w Reflex to ID Panel     Status: None (Preliminary result)   Collection Time: 08/19/24 11:34 AM   Specimen: BLOOD  Result Value Ref Range Status   Specimen Description BLOOD BLOOD RIGHT WRIST  Final   Special Requests   Final    BOTTLES DRAWN AEROBIC AND ANAEROBIC Blood Culture adequate volume   Culture   Final    NO GROWTH 2 DAYS Performed at [name], [address] [name], [name] [address]    Report Status PENDING  Incomplete  Culture, blood (Routine X 2) w Reflex to ID Panel     Status: None (Preliminary result)   Collection Time: 08/21/24  4:48 AM   Specimen: BLOOD  Result Value Ref Range Status   Specimen Description BLOOD BLOOD RIGHT WRIST  Final   Special Requests   Final    BOTTLES DRAWN AEROBIC AND ANAEROBIC Blood Culture adequate volume   Culture   Final    NO GROWTH <12 HOURS Performed at [name],  [address], [place], [name] [address]    Report Status PENDING  Incomplete  Culture, blood (Routine X 2) w Reflex to ID Panel     Status: None (Preliminary result)   Collection Time: 08/21/24  4:48 AM   Specimen: BLOOD  Result Value Ref Range Status   Specimen Description BLOOD BLOOD RIGHT HAND  Final   Special Requests   Final    BOTTLES DRAWN AEROBIC AND ANAEROBIC Blood Culture adequate volume   Culture   Final    NO GROWTH <12 HOURS Performed at [name], [address] [name], [name] [address]    Report Status PENDING  Incomplete    Coagulation Studies: No results for input(s): LABPROT, INR in the last 72 hours.  Urinalysis: No results for input(s): COLORURINE, LABSPEC, PHURINE, GLUCOSEU, HGBUR, BILIRUBINUR, KETONESUR, PROTEINUR, UROBILINOGEN, NITRITE, LEUKOCYTESUR in the last 72 hours.  Invalid input(s): APPERANCEUR    Imaging: No results found.    Medications:     ceFAZolin  (ANCEF ) IV 2 g (08/21/24 0946)    acetaminophen   650 mg Oral TID   Or   acetaminophen   650 mg Rectal TID   ferrous sulfate   325 mg Oral Daily   Gerhardt's butt cream   Topical TID   insulin  aspart  0-15 Units Subcutaneous TID WC   insulin  aspart  0-5 Units Subcutaneous QHS   methocarbamol   500 mg Oral Q6H   pantoprazole   40 mg Oral Daily   sodium chloride  flush  3 mL Intravenous Q12H   acetaminophen , HYDROmorphone  **OR** HYDROmorphone  (DILAUDID ) injection, ondansetron  **OR** ondansetron  (ZOFRAN ) IV, senna-docusate  Assessment/ Plan:  Mr. DEANO TOMASZEWSKI is a 79 y.o.  male with past medical conditions including hypertension, CAD status post CABG, type 2 diabetes, hypertension, severe pulmonary hypertension, and chronic kidney disease stage IIIb.  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  Acute kidney injury secondary to aggressive diuresis.  Creatinine stable today. IVF yesterday. Will hold further IVF today. Will monitor for correction.    Lab Results  Component Value Date   CREATININE 3.07 (H) 08/21/2024   CREATININE 3.05 (H) 08/20/2024   CREATININE 2.66 (H) 08/19/2024    Intake/Output Summary (Last 24 hours) at 08/21/2024 1402 Last data filed at 08/21/2024 0243 Gross per 24 hour  Intake 606 ml  Output 700 ml  Net -94 ml   2. Anemia of chronic kidney disease Lab Results  Component Value Date   HGB 7.5 (L) 08/21/2024    Will defer need for blood transfusion to primary team  3. Secondary Hyperparathyroidism: with outpatient labs: PTH 108, phosphorus 2.9, calcium  9.7 on 04/29/24.   Lab Results  Component Value Date   PTH 61 09/03/2020   PTH Comment 09/03/2020   CALCIUM  9.1 08/21/2024   CAION 1.37 07/01/2024   PHOS 3.3 08/14/2024    Will continue to monitor bone minerals during this admission.   4. Diabetes mellitus type II with chronic kidney disease/renal manifestations: noninsulin dependent. Home regimen includes Jardiance . Most recent hemoglobin A1c is 4.6 on 03/03/24.     LOS: 3 Samanthia Howland 1/9/20262:02 PM   "

## 2024-08-21 NOTE — Progress Notes (Signed)
 "                                                                                                                                                                                               Palliative Care Progress Note, Assessment & Plan   Patient Name: [name]       Date: 08/21/2024 DOB: 1945-12-26  Age: 79 y.o. MRN#: [id number] Attending Physician: Cosette Blackwater, MD Primary Care Physician: [name], MD Admit Date: 08/18/2024  Subjective: Patient is lying in bed, asleep.  He easily awakens to my presence.  He is able to make his wishes known.  His son Curtistine is present at bedside during my visit.  Patient does not move his head or open his eyes given pain.  However, he is fully engaged and able to participate in goals of care medical decision making independently at this time.  HPI: 79 y.o. male  with past medical history of HTN, HLD, CAD s/p CABG, type 2 diabetes, CKD (stage IIIb), CHF (recent EF 60-65%), and severe pulmonary HTN admitted on 08/18/2024 with increasing weakness and dyspnea.   Patient has had 4 inpatient admissions and 1 ED visit in the last 6 months.  Most recent admission was 1229-08/15/2024 for acute exacerbation of heart failure.   Upon presentation to the ED, patient endorses he was weak and having difficulty with ambulation as well as pain in his hand/shoulder on his left side.  Vital signs were within normal limits and patient was hemodynamically stable.  Lab work revealed moderate leukocytosis-16.9, hemoglobin at baseline 8.6, AKI on CKD with creatinine 2.  5 3, WBC mildly elevated at 14.8.  Blood cultures revealed MSSA patient currently on cefazolin .  Radiographs of left hand revealed small foreign body in soft tissues of left middle finger anterior to the middle phalanx.   Patient is current being treated for failure to thrive, acute hypoxic respiratory failure, acute on chronic HFpEF, AKI on CKD, recurrent pleural effusions, MSSA bacteremia, chronic anemia,  demand ischemia.   Patient is being followed by infectious disease, cardiology, PT/OT.   PMT was consulted to support patient and family with goals of care discussions  Following up today for continued discussion on patient's CODE STATUS and symptom management-pain in left upper extremity as well as neck.  Summary of counseling/coordination of care: Chart review completed prior to meeting patient including:  -Labs: Creatinine relatively unchanged at 3.07, hemoglobin with mild increase to 7.5 from 7.2 after 1 unit of RBCs given yesterday -Vital signs: Mild hypotension but patient mentating well, maintaining oxygen saturations at 98% with 4 L nasal cannula -Progress  notes: Note from nephrology on 1/8 sharing patient may have some element of overdiuresis and therefore IV diuretics will continue to be held Imaging:.  Radiograph of left hand, and left shoulder, no imaging of right shoulder at this time  After reviewing the patient's chart, the patient at bedsid. I then spoke with patient in regards to symptom management and goals of care.   Symptoms assessed.  Patient endorses the pain in his neck has increased.  He shares he is unable to move his neck in any direction without causing excruciating pain.  Endorses the pain is stabbing and throbbing.  Additionally, he complains of headache.  He denies it is the worst headache of his life but feels like it is a sharp knife cutting into his head.  Reviewed that Robaxin , Tylenol , and Dilaudid  have been given.  Patient endorses mild to no relief with these pain medications.  Discussed that radiographs of hand and shoulder have been completed.  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 However, clear picture of patient's neck and head have not been completed. I shared concern that severity of patient's pain does not appear to match with mild osteoarthritis.  Therefore, additional workup will continue. Consulted  with attending who is in agreement.  CT of head and neck without contrast ordered.   Reviewed with patient that he has as needed Dilaudid  both in a pill form for moderate pain and an IV form for severe pain breakthrough.  Given that these are as needed, patient will need to ask for these medications.  Patient and son share they were not aware that he did ask for these medications.  They endorse understanding now.  Awaiting results of CT scan to help determine root cause of patient's pain.  He does have osteoarthritis on radiographs of shoulder with  Physical Exam Vitals reviewed.  Constitutional:      General: He is not in acute distress.    Appearance: He is normal weight.  HENT:     Head: Normocephalic.     Nose: Nose normal.     Mouth/Throat:     Mouth: Mucous membranes are dry.  Eyes:     Pupils: Pupils are equal, round, and reactive to light.  Cardiovascular:     Pulses: Normal pulses.  Pulmonary:     Effort: Pulmonary effort is normal.  Abdominal:     Palpations: Abdomen is soft.  Musculoskeletal:     Comments: Limited ROM of neck/head d/t pain Moves all extremities on command but moves slowly given pain  Skin:    General: Skin is warm and dry.     Coloration: Skin is pale.  Neurological:     Mental Status: He is alert and oriented to person, place, and time.  Psychiatric:        Mood and Affect: Mood normal.        Behavior: Behavior normal.              Recommendations:   CT of head and neck without contrast Continue as needed Dilaudid  both p.o. and IV with reminders for patient to ask for pain medication when appropriate Ongoing support to continue for PMT  I personally spent a total of 35 minutes in the care of the patient today including preparing to see the patient, getting/reviewing separately obtained history, counseling and educating, referring and communicating with other health care professionals, and documenting clinical information in the EHR.    Lamarr L. Arvid, DNP, FNP-BC Palliative Medicine  Team   "

## 2024-08-21 NOTE — Care Management Important Message (Signed)
 Important Message  Patient Details  Name: [name] MRN: [id number] Date of Birth: 02-Sep-1945   Important Message Given:  Yes - Medicare IM     [name] 08/21/2024, 3:22 PM

## 2024-08-21 NOTE — Hospital Course (Signed)
 79 y.o. year old male with medical history of hypertension, hyperlipidemia complicated by CAD status post CABG, type 2 diabetes, CHF (EF 60-65%) and 02/2024, CKD 3B, and severe pulmonary hypertension presenting to the ED with increasing weakness, and dyspnea. Patient with multiple recent admissions with the most recent admission from 08/10/2024 - 08/15/2024 for acute heart failure exacerbation. States he has been weak and having difficulty with ambulation. He is also having pain in his hand along with his shoulders. He denies any URI symptoms but states his breathing has worsened since leaving the hospital. Denies any fevers or chills. On arrival to the ED patient was noted to be HDS stable. Lab work and imaging obtained. CBC with moderate leukocytosis at 16.9, hemoglobin at baseline at 8.6, thrombocytopenia that is lower than recent platelet count. CMP with moderate hyperglycemia, AKI on CKD 3B, hypercalcemia at 11.2, elevated ALP and bilirubin level. Respiratory panel negative for COVID, flu, RSV. Troponin mildly elevated but downtrending. proBNP significantly elevated from 8 days ago and has doubled since then going from 15k to 28k. Chest x-ray with diffuse interstitial prominence concerning for edema along with moderate right pleural effusion and small left pleural effusion. [facility] contacted for admission.

## 2024-08-21 NOTE — Plan of Care (Signed)

## 2024-08-21 NOTE — Progress Notes (Signed)
 OT Cancellation Note  Patient Details Name: [name] MRN: [id number] DOB: 1946-01-17   Cancelled Treatment:    Reason Eval/Treat Not Completed: Medical issues which prohibited therapy. Pt currently NPO, pending TEE this AM. RN requests OT hold at this time, OT will continue to follow and check back as able.  Ardit Danh L. Makenly [place]abee, OTR/L  08/21/2024, 9:44 AM

## 2024-08-21 NOTE — Progress Notes (Signed)
 " [facility] CARDIOLOGY PROGRESS NOTE       Patient ID: [name] MRN: [id number] DOB/AGE: [birth year] 79 y.o.  Admit date: 08/18/2024 Referring Physician Dr. Morene Bathe Primary Physician [name], MD  Primary Cardiologist Dr. Ammon Reason for Consultation AoCHF  HPI: [name] is a 79 y.o. 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 Cardiology was consulted for further evaluation.   Interval history: -Patient seen and examined this AM, resting in bed with son at bedside.  -Respiratory status remains the same, he denies any change in his breathing.  Does appear to have improvement in work of breathing and is on 4 L today compared to 5 yesterday. -Cr stable today. - Received blood yesterday, hemoglobin 7.5 today.  Review of systems complete and found to be negative unless listed above    Past Medical History:  Diagnosis Date   (HFpEF) heart failure with preserved ejection fraction (HCC) 07/25/2016   a.)TTE 07/25/16: EF >55, triv-mild pan regur, mild AS, G2DD; b.)TTE 09/02/16: EF >55, triv TR, mild AS; c.)TTE 11/05/16: EF >55, LAE, triv-mild pan regur, mild AS, G2DD; d.)TTE 01/21/20: EF >55, mod LVH, LAE/RVE, triv-mild AR/PR/TR, mod MR, mild AS, G2DD; e.)TTE 09/24/22: EF>55, sev LVH, sev LAE, RAE, mild PR, mod AR/MR/TR, mild-mod AS, RVSP 74.7; f.)TTE 12/23/22: EF 60-65, LAE, mild-mod MR/TR, mild AS   Abnormal urine 05/01/2023   Anemia    Aortic atherosclerosis    Aortic stenosis 07/25/2016   a.) TTE 07/25/2016: mild (MPG 13.5); b.) TTE 09/02/2016: mild (MPG 16); c.) TTE 11/05/2016: mild (MPG 9); d.) TTE 01/21/2020: mild (MPG 11.7); e.) TTE  09/24/2022: mild-mod (MPG 20.3); f.) TTE 12/23/2022: mild (MPG 15)   Atrial fibrillation (HCC)    a.) CHA2DS2VASc = 6 (age x2, CHF, HTN, vascular disease history, T2DM);  b.) rate/rhythm maintained on oral labetalol ; chronically anticoagulated with apixaban  + clopidogrel    AV block, 1st degree    B12 deficiency    CKD (chronic kidney disease), stage III (HCC)    Colon polyps    Congestive heart failure (HCC) 05/01/2023   Coronary artery disease 08/16/2016   a.) s/p 3v CABG 08/30/2016; b.) s/p PCI 11/26/2016 (DES x 1 oSVG-RCA); c.) s/p PCI 12/17/2016 (DES x 5 --> mLCx x2, dLCx, pLAD, dLAD); c.) s/p PCI 06/09/2020 (DES x1 --> ISR oSVG-PDA)   Cyst of kidney, acquired 07/03/2023   Dyspnea    Gastroesophageal reflux disease 05/01/2023   GERD (gastroesophageal reflux disease)    Heart murmur    Hepatic steatosis    History of 2019 novel coronavirus disease (COVID-19) 09/02/2020   a.) Tx'd with remdesivir    History of blood transfusion    History of GI bleed 09/02/2020   a.) admitted to [facility] 09/02/2020 - 09/08/2020   History of heart artery stent 11/26/2016   TOTAL of 7 stents: a.) 11/26/2016 --> 3.5 x 22 mm Resolute Integrity oSVG-RCA; b.) 12/17/2016: 3.0 x 12 mm Xience Alpine dLCx, 3.5 x 22 mm Resolute Onyx mLCx, 3.5 x 18 mm Resolute Onyx mLCx, 3.0 x 26 mm Resolute Onyx pLAD, 2.0 x 26 mm Resolute Onyx dLAD; c.) 06/09/2020 --> 2.5 x 18 mm Resolute Onyx ISR oSVG-PDA   History of partial colectomy 08/12/2012  a.) large gastric hyperplastic polyp removal   HLD (hyperlipidemia)    Hypertension    LBBB (left bundle branch block)    Long term current use of anticoagulant    a.) apixaban    Long term current use of antithrombotics/antiplatelets    a.) clopidogrel    Male hypogonadism    a.) on exogenous TRT (1.62% gel)   Mild cardiomegaly    Mild gynecomastia (bilateral)    Nephrolithiasis    OSA (obstructive sleep apnea)    a.) does not utilize nocturnal PAP therapy   Personal history of  surgery to heart and great vessels, presenting hazards to health 05/01/2023   Proteinuria, unspecified 08/01/2023   Pulmonary hypertension (HCC) 09/24/2022   a.) TTE 09/24/2022: RVSP 74.7; b.) TTE 12/23/2022: RVSP 31.3   RBBB (right bundle branch block with left anterior fascicular block)    Right inguinal hernia    Right thyroid  nodule    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   a.) LIMA-LAD, SVG-PDA, SVG-OM3   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/01/2023   Type 2 diabetes mellitus treated with insulin  (HCC)    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   Umbilical hernia    Unstable angina (HCC)    Vitamin D  deficiency     Past Surgical History:  Procedure Laterality Date   ANOMALOUS PULMONARY VENOUS RETURN REPAIR, TOTAL  2025   APPENDECTOMY     CARDIAC CATHETERIZATION Left 08/16/2016   Procedure: Left Heart Cath and Coronary Angiography;  Surgeon: Cara JONETTA Lovelace, MD;  Location: [facility] INVASIVE CV LAB;  Service: Cardiovascular;  Laterality: Left;   COLONOSCOPY N/A 09/07/2020   Procedure: COLONOSCOPY;  Surgeon: Janalyn Keene NOVAK, MD;  Location: [facility] ENDOSCOPY;  Service: Endoscopy;  Laterality: N/A;   COLONOSCOPY N/A 10/19/2023   Procedure: COLONOSCOPY;  Surgeon: Jinny Carmine, MD;  Location: [facility] ENDOSCOPY;  Service: Endoscopy;  Laterality: N/A;   COLONOSCOPY WITH PROPOFOL  N/A 05/17/2021   Procedure: COLONOSCOPY WITH PROPOFOL ;  Surgeon: Janalyn Keene NOVAK, MD;  Location: [facility] ENDOSCOPY;  Service: Endoscopy;  Laterality: N/A;   CORONARY ANGIOPLASTY WITH STENT PLACEMENT Left 11/26/2016   Procedure: CORONARY ANGIOPLASTY WITH STENT PLACEMENT; Location: [facility]; Surgeon: Alm Dais, MD   CORONARY ARTERY BYPASS GRAFT N/A 08/30/2016   Procedure: CORONARY ARTERY BYPASS GRAFT; Location: [facility]; Surgeon: Juliene Pouch, MD   CORONARY STENT INTERVENTION N/A 06/09/2020   Procedure: CORONARY STENT INTERVENTION;  Surgeon: Lovelace Cara JONETTA, MD;  Location: [facility]  LAB;  Service: Cardiovascular;  Laterality: N/A;   CORONARY STENT INTERVENTION Left 12/17/2016   Procedure: CORONARY STENT INTERVENTION; Location: [facility]; Surgeon: Alm Dais, MD   CORONARY STENT INTERVENTION  03/2024   ENDOSCOPIC MUCOSAL RESECTION N/A 09/22/2020   Procedure: ENDOSCOPIC MUCOSAL RESECTION;  Surgeon: [name] Aloha Raddle., MD;  Location: [facility] ENDOSCOPY;  Service: Gastroenterology;  Laterality: N/A;   ESOPHAGOGASTRODUODENOSCOPY N/A 09/07/2020   Procedure: ESOPHAGOGASTRODUODENOSCOPY (EGD);  Surgeon: Janalyn Keene NOVAK, MD;  Location: [facility] ENDOSCOPY;  Service: Endoscopy;  Laterality: N/A;   ESOPHAGOGASTRODUODENOSCOPY N/A 10/19/2023   Procedure: EGD (ESOPHAGOGASTRODUODENOSCOPY);  Surgeon: Jinny Carmine, MD;  Location: [facility] ENDOSCOPY;  Service: Endoscopy;  Laterality: N/A;   ESOPHAGOGASTRODUODENOSCOPY (EGD) WITH PROPOFOL  N/A 09/22/2020   Procedure: ESOPHAGOGASTRODUODENOSCOPY (EGD) WITH PROPOFOL ;  Surgeon: [name] Aloha Raddle., MD;  Location: [facility] ENDOSCOPY;  Service: Gastroenterology;  Laterality: N/A;   ESOPHAGOGASTRODUODENOSCOPY (EGD) WITH PROPOFOL  N/A 08/26/2023   Procedure: ESOPHAGOGASTRODUODENOSCOPY (EGD) WITH PROPOFOL ;  Surgeon: Jinny Carmine, MD;  Location: [facility] ENDOSCOPY;  Service: Endoscopy;  Laterality: N/A;  FRACTURE SURGERY     HEMOSTASIS CLIP PLACEMENT  09/22/2020   Procedure: HEMOSTASIS CLIP PLACEMENT;  Surgeon: [name] Aloha Raddle., MD;  Location: [facility] ENDOSCOPY;  Service: Gastroenterology;;   HEMOSTASIS CONTROL  09/22/2020   Procedure: HEMOSTASIS CONTROL;  Surgeon: [name] Aloha Raddle., MD;  Location: [facility] ENDOSCOPY;  Service: Gastroenterology;;   INSERTION OF MESH  02/19/2023   Procedure: INSERTION OF MESH;  Surgeon: Desiderio Schanz, MD;  Location: [facility] ORS;  Service: General;;  umbilical   LAPAROSCOPIC PARTIAL RIGHT COLECTOMY Right 08/12/2012   Procedure: LAPAROSCOPIC PARTIAL RIGHT COLECTOMY; Location: [facility]; Surgeon: Unknown Sharps, MD   LEFT HEART CATH AND CORONARY  ANGIOGRAPHY Left 06/09/2020   Procedure: LEFT HEART CATH AND CORONARY ANGIOGRAPHY;  Surgeon: Florencio Cara BIRCH, MD;  Location: [facility];  Service: Cardiovascular;  Laterality: Left;   LEFT HEART CATH AND CORS/GRAFTS ANGIOGRAPHY Left 11/20/2016   Procedure: Left Heart Cath and Cors/Grafts Angiography;  Surgeon: Cara BIRCH Florencio, MD;  Location: [facility];  Service: Cardiovascular;  Laterality: Left;   POLYPECTOMY  09/22/2020   Procedure: POLYPECTOMY;  Surgeon: [name] Aloha Raddle., MD;  Location: [facility] ENDOSCOPY;  Service: Gastroenterology;;   POLYPECTOMY  10/19/2023   Procedure: POLYPECTOMY;  Surgeon: Jinny Carmine, MD;  Location: [facility] ENDOSCOPY;  Service: Endoscopy;;   RIGHT HEART CATH Right 07/01/2024   Procedure: RIGHT HEART CATH;  Surgeon: Ammon Blunt, MD;  Location: [facility];  Service: Cardiovascular;  Laterality: Right;   SUBMUCOSAL LIFTING INJECTION  09/22/2020   Procedure: SUBMUCOSAL LIFTING INJECTION;  Surgeon: [name] Aloha Raddle., MD;  Location: [facility] ENDOSCOPY;  Service: Gastroenterology;;   UMBILICAL HERNIA REPAIR N/A 02/19/2023   Procedure: HERNIA REPAIR UMBILICAL ADULT, open;  Surgeon: Desiderio Schanz, MD;  Location: [facility] ORS;  Service: General;  Laterality: N/A;   XI ROBOTIC ASSISTED INGUINAL HERNIA REPAIR WITH MESH Right 02/19/2023   Procedure: XI ROBOTIC ASSISTED INGUINAL HERNIA REPAIR WITH MESH;  Surgeon: Desiderio Schanz, MD;  Location: [facility] ORS;  Service: General;  Laterality: Right;    Medications Prior to Admission  Medication Sig Dispense Refill Last Dose/Taking   amLODipine  (NORVASC ) 10 MG tablet Take 10 mg by mouth daily.      aspirin  EC 81 MG tablet Take 81 mg by mouth daily.      atorvastatin  (LIPITOR ) 80 MG tablet Take 80 mg by mouth.  Take 80 mg by mouth in the morning.      bisacodyl  (DULCOLAX) 5 MG EC tablet Take 5 mg by mouth as needed for moderate constipation.      carvedilol  (COREG ) 3.125 MG tablet Take 1 tablet (3.125  mg total) by mouth 2 (two) times daily with a meal. 60 tablet 1    cyanocobalamin  (VITAMIN B12) 1000 MCG tablet Take 1,000 mcg by mouth daily.      dexlansoprazole (DEXILANT) 60 MG capsule Take 1 capsule by mouth daily.      empagliflozin  (JARDIANCE ) 10 MG TABS tablet Take 1 tablet (10 mg total) by mouth daily before breakfast. 30 tablet 0    Ferrous Sulfate  (IRON ) 325 (65 Fe) MG TABS Take 1 tablet by mouth daily.      isosorbide  mononitrate (IMDUR ) 120 MG 24 hr tablet Take 120 mg by mouth daily.      lactulose  (CHRONULAC ) 10 GM/15ML solution Taking 15-20 cc by mouth 3 to 4 times a day for constiption (Patient taking differently: Taking 15-20 cc by mouth 3 to 4 times a day for constiption as needed) 946 mL 2  nitroGLYCERIN  (NITROSTAT ) 0.4 MG SL tablet Place 0.4 mg under the tongue every 5 (five) minutes as needed for chest pain.      torsemide  (DEMADEX ) 20 MG tablet Take 3 tablets (60 mg total) by mouth daily.      Social History   Socioeconomic History   Marital status: Married    Spouse name: Not on file   Number of children: Not on file   Years of education: Not on file   Highest education level: Not on file  Occupational History   Not on file  Tobacco Use   Smoking status: Former    Current packs/day: 0.00    Types: Cigarettes    Quit date: 2000    Years since quitting: 26.0    Passive exposure: Never   Smokeless tobacco: Never  Vaping Use   Vaping status: Never Used  Substance and Sexual Activity   Alcohol  use: No   Drug use: No   Sexual activity: Not Currently  Other Topics Concern   Not on file  Social History Narrative   Not on file   Social Drivers of Health   Tobacco Use: Medium Risk (08/10/2024)   Patient History    Smoking Tobacco Use: Former    Smokeless Tobacco Use: Never    Passive Exposure: Never  Physicist, Medical Strain: Low Risk  (05/08/2024)   Received from [facility]   Overall Financial Resource Strain (CARDIA)    Difficulty  of Paying Living Expenses: Not hard at all  Food Insecurity: No Food Insecurity (08/20/2024)   Epic    Worried About Radiation Protection Practitioner of Food in the Last Year: Never true    Ran Out of Food in the Last Year: Never true  Transportation Needs: No Transportation Needs (08/20/2024)   Epic    Lack of Transportation (Medical): No    Lack of Transportation (Non-Medical): No  Physical Activity: Not on file  Stress: Not on file  Social Connections: Unknown (08/20/2024)   Social Connection and Isolation Panel    Frequency of Communication with Friends and Family: More than three times a week    Frequency of Social Gatherings with Friends and Family: More than three times a week    Attends Religious Services: Not on Marketing Executive or Organizations: Not on file    Attends Banker Meetings: Not on file    Marital Status: Not on file  Recent Concern: Social Connections - Moderately Isolated (07/18/2024)   Social Connection and Isolation Panel    Frequency of Communication with Friends and Family: More than three times a week    Frequency of Social Gatherings with Friends and Family: More than three times a week    Attends Religious Services: Never    Database Administrator or Organizations: No    Attends Banker Meetings: Never    Marital Status: Married  Catering Manager Violence: Not At Risk (08/20/2024)   Epic    Fear of Current or Ex-Partner: No    Emotionally Abused: No    Physically Abused: No    Sexually Abused: No  Depression (PHQ2-9): Low Risk (07/16/2024)   Depression (PHQ2-9)    PHQ-2 Score: 0  Alcohol  Screen: Not on file  Housing: Low Risk (08/20/2024)   Epic    Unable to Pay for Housing in the Last Year: No    Number of Times Moved in the Last Year: 0    Homeless in the  Last Year: No  Utilities: Not At Risk (08/20/2024)   Epic    Threatened with loss of utilities: No  Health Literacy: Not on file    Family History  Problem Relation Age of Onset    Hyperlipidemia Mother    Heart disease Mother    Hypertension Father      Vitals:   08/20/24 2327 08/21/24 0238 08/21/24 0500 08/21/24 0734  BP: (!) 106/55 (!) 113/57  123/72  Pulse: 74 79  77  Resp: 20 (!) 22  20  Temp: (!) 97.5 F (36.4 C) 97.8 F (36.6 C)  (!) 97.4 F (36.3 C)  TempSrc:    Axillary  SpO2: 98% 97%  93%  Weight:   93.8 kg   Height:        PHYSICAL EXAM General: Ill-appearing male, in no acute distress. HEENT: Normocephalic and atraumatic. Neck: No JVD.  Lungs: Normal respiratory effort on 4L [place].  Diminished bilaterally. Heart: Irregularly irregular, controlled rate. Normal S1 and S2 without gallops or murmurs.  Abdomen: Non-distended appearing.  Msk: Normal strength and tone for age. Extremities: Warm and well perfused. No clubbing, cyanosis.  Chronic appearing but overall improved edema.  Neuro: Alert and oriented X 3. Psych: Answers questions appropriately.   Labs: Basic Metabolic Panel: Recent Labs    08/18/24 1641 08/19/24 0425 08/20/24 0420 08/21/24 0448  NA 136   < > 135 133*  K 4.9   < > 4.8 4.4  CL 94*   < > 93* 94*  CO2 30   < > 30 30  GLUCOSE 203*   < > 206* 207*  BUN 69*   < > 88* 95*  CREATININE 2.53*   < > 3.05* 3.07*  CALCIUM  11.2*   < > 9.9 9.1  MG 2.0  --   --   --    < > = values in this interval not displayed.   Liver Function Tests: Recent Labs    08/18/24 1641 08/19/24 0425  AST 31 23  ALT 18 15  ALKPHOS 134* 115  BILITOT 2.5* 1.9*  PROT 7.4 6.6  ALBUMIN 3.4* 3.0*   No results for input(s): LIPASE, AMYLASE in the last 72 hours. CBC: Recent Labs    08/18/24 1641 08/19/24 0425 08/20/24 0420 08/21/24 0448  WBC 16.9*   < > 11.2* 8.8  NEUTROABS 15.7*  --   --   --   HGB 8.6*   < > 7.2* 7.5*  HCT 27.4*   < > 22.9* 23.0*  MCV 100.0   < > 98.7 95.4  PLT 86*   < > 78* 78*   < > = values in this interval not displayed.   Cardiac Enzymes: No results for input(s): CKTOTAL, CKMB, CKMBINDEX,  TROPONINIHS in the last 72 hours. BNP: No results for input(s): BNP in the last 72 hours. D-Dimer: No results for input(s): DDIMER in the last 72 hours. Hemoglobin A1C: No results for input(s): HGBA1C in the last 72 hours. Fasting Lipid Panel: No results for input(s): CHOL, HDL, LDLCALC, TRIG, CHOLHDL, LDLDIRECT in the last 72 hours. Thyroid  Function Tests: No results for input(s): TSH, T4TOTAL, T3FREE, THYROIDAB in the last 72 hours.  Invalid input(s): FREET3 Anemia Panel: Recent Labs    08/18/24 2210  VITAMINB12 1,236*  FOLATE 12.3     Radiology: ECHOCARDIOGRAM COMPLETE Result Date: 08/19/2024    ECHOCARDIOGRAM REPORT   Patient Name:   [name] Date of Exam: 08/18/2024 Medical Rec #:  [id number]  Height:       73.0 in Accession #:    [id number]       Weight:       190.0 lb Date of Birth:  [birth year]         BSA:          2.105 m Patient Age:    78 years         BP:           124/76 mmHg Patient Gender: M                HR:           115 bpm. Exam Location:  [facility] Procedure: 2D Echo, Cardiac Doppler and Color Doppler (Both Spectral and Color            Flow Doppler were utilized during procedure). Indications:     I50.21 Acute Systolic CHF  History:         Patient has prior history of Echocardiogram examinations, most                  recent 03/03/2024. CHF, CAD, Prior CABG and TAVR-01/23/24,                  Arrythmias:LBBB, RBBB and Atrial Fibrillation,                  Signs/Symptoms:Murmur and Dyspnea; Risk Factors:Hypertension,                  Diabetes and Dyslipidemia.  Sonographer:     Carl Coma RDCS Referring Phys:  [id number] MORENE BATHE Diagnosing Phys: Dwayne D Callwood MD IMPRESSIONS  1. Left ventricular ejection fraction, by estimation, is 60 to 65%. The left ventricle has normal function. The left ventricle has no regional wall motion abnormalities. There is severe concentric left ventricular hypertrophy. Left ventricular diastolic   parameters are consistent with Grade III diastolic dysfunction (restrictive). There is the interventricular septum is flattened in diastole ('D' shaped left ventricle), consistent with right ventricular volume overload.  2. Right ventricular systolic function is moderately reduced. The right ventricular size is moderately enlarged. Mildly increased right ventricular wall thickness.  3. Left atrial size was severely dilated.  4. Right atrial size was moderately dilated.  5. The mitral valve is grossly normal. Moderate mitral valve regurgitation. Moderate mitral annular calcification.  6. Tricuspid valve regurgitation is moderate to severe.  7. The aortic valve is grossly normal. Aortic valve regurgitation is mild. Aortic valve sclerosis/calcification is present, without any evidence of aortic stenosis. 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 FINDINGS  Left Ventricle: Left ventricular ejection fraction, by estimation, is 60 to 65%. The left ventricle has normal function. The left ventricle has no regional wall motion abnormalities. Strain was performed and the global longitudinal strain is indeterminate. The left ventricular internal cavity size was normal in size. There is severe concentric left ventricular hypertrophy. The interventricular septum is flattened in diastole ('D' shaped left ventricle), consistent with right ventricular volume overload. Left ventricular diastolic parameters are consistent with Grade III diastolic dysfunction (restrictive). Right Ventricle: The right ventricular size is moderately enlarged. Mildly increased right ventricular wall thickness. Right ventricular systolic function is moderately reduced. Left Atrium: Left atrial size was severely dilated. Right Atrium: Right atrial size was moderately dilated. Pericardium: There is no evidence of pericardial effusion. Mitral Valve: The mitral valve is grossly normal. There is moderate  thickening of the mitral  valve leaflet(s). There is moderate calcification of the mitral valve leaflet(s). Moderate mitral annular calcification. Moderate mitral valve regurgitation. Tricuspid Valve: The tricuspid valve is grossly normal. Tricuspid valve regurgitation is moderate to severe. The aortic valve is grossly normal. Aortic valve regurgitation is mild. Aortic valve sclerosis/calcification is present, without any evidence of aortic stenosis. 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 Pulmonic Valve: The pulmonic valve was normal in structure. Pulmonic valve regurgitation is not visualized. Aorta: The aortic root was not well visualized. IAS/Shunts: No atrial level shunt detected by color flow Doppler. Additional Comments: 3D was performed not requiring image post processing on an independent workstation and was indeterminate.  LEFT VENTRICLE PLAX 2D LVIDd:         4.50 cm LVIDs:         3.20 cm LV PW:         1.80 cm LV IVS:        1.80 cm  RIGHT VENTRICLE            IVC RV Basal diam:  4.90 cm    IVC diam: 2.10 cm RV S prime:     6.74 cm/s TAPSE (M-mode): 1.2 cm LEFT ATRIUM             Index        RIGHT ATRIUM           Index LA diam:        5.30 cm 2.52 cm/m   RA Area:     28.30 cm LA Vol (A2C):   94.4 ml 44.84 ml/m  RA Volume:   102.00 ml 48.45 ml/m LA Vol (A4C):   92.0 ml 43.70 ml/m LA Biplane Vol: 97.0 ml 46.08 ml/m  AORTIC VALVE AV Vmax:           302.00 cm/s AV Vmean:          212.200 cm/s AV VTI:            0.447 m AV Peak Grad:      36.5 mmHg AV Mean Grad:      20.6 mmHg LVOT Vmax:         81.03 cm/s LVOT Vmean:        52.067 cm/s LVOT VTI:          0.135 m LVOT/AV VTI ratio: 0.30 AI PHT:            277 msec  AORTA Ao Root diam: 3.40 cm Ao Asc diam:  3.90 cm MR Peak grad: 112.8 mmHg    TRICUSPID VALVE MR Vmax:      531.00 cm/s   TR Peak grad:   58.7 mmHg MV E velocity: 163.25  cm/s  TR Vmax:        383.00 cm/s                              SHUNTS                             Systemic VTI: 0.14 m Cara JONETTA Lovelace MD Electronically signed by Cara JONETTA Lovelace MD Signature Date/Time: 08/19/2024/12:52:25 PM    Final    DG Hand Complete Left Result Date: 08/19/2024 EXAM: 3 OR MORE VIEW(S) XRAY OF THE LEFT HAND 08/19/2024 12:49:00 AM COMPARISON: 08/18/2024. CLINICAL HISTORY:  Swelling. FINDINGS: BONES AND JOINTS: No acute fracture, subluxation or dislocation. No malalignment. Calcifications about the left wrist, likely synovial calcifications. Arthritic changes throughout the left wrist. SOFT TISSUES: Small radiopaque foreign body noted in the soft tissues of the left middle finger anterior to the middle phalanx. IMPRESSION: 1. Small radiopaque foreign body in the soft tissues of the left middle finger anterior to the middle phalanx. 2. No acute fracture, subluxation, or dislocation. Electronically signed by: Franky Crease MD 08/19/2024 01:00 AM EST RP Workstation: [id number]   US  Venous Img Upper Uni Left (DVT) Result Date: 08/19/2024 CLINICAL DATA:  Swelling EXAM: Left UPPER EXTREMITY VENOUS DOPPLER ULTRASOUND TECHNIQUE: Gray-scale sonography with graded compression, as well as color Doppler and duplex ultrasound were performed to evaluate the upper extremity deep venous system from the level of the subclavian vein and including the jugular, axillary, basilic, radial, ulnar and upper cephalic vein. Spectral Doppler was utilized to evaluate flow at rest and with distal augmentation maneuvers. COMPARISON:  None Available. FINDINGS: Contralateral Subclavian Vein: Respiratory phasicity is normal and symmetric with the symptomatic side. No evidence of thrombus. Internal Jugular Vein: No evidence of thrombus. Normal compressibility, respiratory phasicity and response to augmentation. Subclavian Vein: No evidence of thrombus. Normal respiratory phasicity and response to augmentation. Axillary Vein: No  evidence of thrombus. Normal compressibility, respiratory phasicity and response to augmentation. Cephalic Vein: No evidence of thrombus. Normal compressibility, respiratory phasicity and response to augmentation. Basilic Vein: No evidence of thrombus. Normal compressibility, respiratory phasicity and response to augmentation. Brachial Veins: No evidence of thrombus. Normal compressibility, respiratory phasicity and response to augmentation. Radial Veins: No evidence of thrombus. Normal compressibility, respiratory phasicity and response to augmentation. Ulnar Veins: No evidence of thrombus. Normal compressibility, respiratory phasicity and response to augmentation. IMPRESSION: No evidence of DVT within the left upper extremity. Electronically Signed   By: Luke Bun M.D.   On: 08/19/2024 00:31   DG Shoulder Left Result Date: 08/18/2024 EXAM: 1 VIEW(S) XRAY OF THE LEFT SHOULDER 08/18/2024 07:58:00 PM COMPARISON: None available. CLINICAL HISTORY: Shoulder pain, left. FINDINGS: BONES AND JOINTS: Glenohumeral joint is normally aligned. No acute fracture. No malalignment. 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 SOFT TISSUES: No abnormal calcifications. Visualized lung is unremarkable. IMPRESSION: 1. 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 Electronically signed by: Elsie Gravely MD 08/18/2024 08:12 PM EST RP Workstation: [id number]   DG Hand 2 View Left Result Date: 08/18/2024 EXAM: 1 or 2 VIEW(S) XRAY OF THE LEFT HAND 08/18/2024 07:58:00 PM COMPARISON: None available. CLINICAL HISTORY: Pain FINDINGS: BONES AND JOINTS: Prominent degenerative changes in the interphalangeal joints, first carpometacarpal and metacarpophalangeal joints, radiocarpal, radioulnar, and intercarpal joints. Old healed fracture deformity of the distal radius. Subcortical cyst in the base of the 1st metacarpal, likely degenerative cyst. SOFT TISSUES: Soft tissue calcifications in  and around the wrist likely representing synovial calcifications. Dorsal soft tissue swelling. Tiny metallic foreign body suggested in the soft tissues of the third finger adjacent to the middle phalanx. IMPRESSION: 1. Dorsal soft tissue swelling. 2. Tiny metallic foreign body suggested in the soft tissues of the third finger adjacent to the middle phalanx. 3. Prominent degenerative changes in the interphalangeal joints, first carpometacarpal and metacarpophalangeal joints, radiocarpal, radioulnar, and intercarpal joints. 4. Soft tissue calcifications in and around the wrist, likely representing synovial calcifications. 5. Old healed fracture deformity of the distal radius. Electronically signed by: Elsie Gravely MD 08/18/2024 08:11 PM EST RP Workstation: [id number]   DG Chest Portable 1 View  Result Date: 08/18/2024 EXAM: 1 VIEW(S) XRAY OF THE CHEST 08/18/2024 04:53:00 PM COMPARISON: 08/10/2024 CLINICAL HISTORY: SOB FINDINGS: LUNGS AND PLEURA: Moderate right pleural effusion, stable compared to prior. Small left pleural effusion. Mild diffuse interstitial prominence. Bibasilar airspace opacities, likely compressive atelectasis. No pneumothorax. HEART AND MEDIASTINUM: Enlarged cardiomediastinal silhouette, unchanged. Median sternotomy and TAVR noted. Atherosclerotic calcifications. BONES AND SOFT TISSUES: No acute osseous abnormality. IMPRESSION: 1. Moderate right pleural effusion and small left pleural effusion. 2. Mild diffuse interstitial prominence and bibasilar airspace opacities, likely compressive atelectasis. 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 LUNGS AND PLEURA: Mild pulmonary edema, unchanged. Stable small to  moderate bilateral pleural effusions. Unchanged bibasilar opacities. No pneumothorax. HEART AND MEDIASTINUM: Stable cardiomegaly. Aortic atherosclerosis. BONES AND SOFT TISSUES: Median sternotomy wires noted. No acute osseous abnormality. IMPRESSION: 1. Mild pulmonary edema, unchanged. 2. Stable small to moderate bilateral pleural effusions. Electronically signed by: Greig Pique MD 08/10/2024 06:13 PM EST RP Workstation: [id number]   US  THORACENTESIS ASP PLEURAL SPACE W/IMG GUIDE Result Date: 08/10/2024 INDICATION: 79 year old male with an extensive cardiac history who presented to the ED today with worsening exertional chest pain and dyspnea on exertion. Workup consistent with heart failure exacerbation and imaging concerning for moderate bilateral pleural effusions with mild pulmonary edema and bibasilar atelectasis. Request for diagnostic and therapeutic thoracentesis. EXAM: ULTRASOUND GUIDED DIAGNOSTIC AND THERAPEUTIC, LEFT-SIDED THORACENTESIS MEDICATIONS: 1% lidocaine , 6 mL. COMPLICATIONS: None immediate. PROCEDURE: An ultrasound guided thoracentesis was thoroughly discussed with the patient and questions answered. The benefits, risks, alternatives and complications were also discussed. The patient understands and wishes to proceed with the procedure. Written consent was obtained. Ultrasound was performed to localize and mark an adequate pocket of fluid in the left chest. The area was then prepped and draped in the normal sterile fashion. 1% Lidocaine  was used for local anesthesia. Under ultrasound guidance a 6 Fr Safe-T-Centesis catheter was introduced. Thoracentesis was performed. The catheter was removed and a dressing applied. FINDINGS: A total of approximately 700 mL of serous sanguinous fluid was removed. Samples were sent to the laboratory as requested by the clinical team. IMPRESSION: Successful ultrasound guided left-sided thoracentesis yielding 700 mL of pleural fluid. Procedure performed by:  Sherrilee Bal, PA-C under the supervision of Dr. VEAR Lent Electronically Signed   By: Wilkie Lent M.D.   On: 08/10/2024 16:38   DG Chest 2 View Result Date: 08/10/2024 EXAM: 2 VIEW(S) XRAY OF THE CHEST 08/10/2024 08:36:00 AM COMPARISON: 08/01/2024 CLINICAL HISTORY: dyspnea, cp FINDINGS: LUNGS AND PLEURA: Moderate bilateral pleural effusions. Mild pulmonary edema. Bibasilar atelectasis/airspace disease. No pneumothorax. HEART AND MEDIASTINUM: Stable CABG markers. TAVR noted. Aortic atherosclerosis. BONES AND SOFT TISSUES: Sternotomy wires noted. No acute osseous abnormality. IMPRESSION: 1. Moderate bilateral pleural effusions with mild pulmonary edema and bibasilar atelectasis/airspace disease. Electronically signed by: Waddell Calk MD 08/10/2024 08:41 AM EST RP Workstation: [id number]   DG Chest 1 View Result Date: 08/01/2024 EXAM: 1 VIEW(S) XRAY OF THE CHEST 08/01/2024 09:32:09 AM COMPARISON: 07/20/2024 CLINICAL HISTORY: SOB (shortness of breath) FINDINGS: LUNGS AND PLEURA: Bibasilar airspace opacities are stable. Mild pulmonary edema is present. Moderate right pleural effusion, slightly decreased. Similar small left pleural effusion. No pneumothorax. HEART AND MEDIASTINUM: Stable cardiomegaly. Aortic atherosclerotic calcification. Aortic valve replacement is noted. BONES  AND SOFT TISSUES: Median sternotomy is noted. No acute osseous abnormality. IMPRESSION: 1. Moderate right pleural effusion, slightly decreased, and similar small left pleural effusion. 2. Stable bibasilar airspace opacities and mild pulmonary edema. 3. Stable cardiomegaly and aortic atherosclerotic calcification. 4. Postsurgical changes of median sternotomy and aortic valve replacement. Electronically signed by: Evalene Coho MD 08/01/2024 09:37 AM EST RP Workstation: [id number]    ECHO as above  TELEMETRY (personally reviewed): atrial fibrillation rate 70s  EKG (personally reviewed): atrial fibrillation rate 87  bpm  Data reviewed by me 08/21/2024: last 24h vitals tele labs imaging I/O ED provider note, admission H&P, hospitalist progress note, palliative notes  Principal Problem:   Failure to thrive in adult Active Problems:   T2DM (type 2 diabetes mellitus) (HCC)   Essential hypertension   OSA (obstructive sleep apnea)   CAD (coronary artery disease), s/p CABG x 3   Hyperlipidemia associated with type 2 diabetes mellitus (HCC)   Paroxysmal atrial fibrillation (HCC)   Acute kidney injury superimposed on stage 3b chronic kidney disease (HCC)   Moderate mitral regurgitation   Anemia in chronic kidney disease   Acute heart failure with preserved ejection fraction (HCC)   Bacteremia due to methicillin susceptible Staphylococcus aureus (MSSA)   History of transcatheter aortic valve replacement (TAVR)    ASSESSMENT AND PLAN:  [name] is a 79 y.o. 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 Cardiology was consulted for further evaluation.   # Failure to thrive # Acute hypoxic respiratory failure # Acute on chronic HFpEF # AKI on CKD # Recurrent pleural effusions # MSSA bacteremia # Coronary artery disease s/p CABG # Demand ischemia # Aortic stenosis s/p TAVR 01/2024 # Chronic anemia Patient with past medical history as above who has been admitted multiple times recently due to anemia requiring transfusions as well as acute heart failure and recurrent pleural effusions.  He presented this admission with complaints of generalized weakness and shortness of breath worsening over the last week.  BNP elevated at 28,000.  Troponins mildly elevated and flat trending at 167  > 159.  EKG without acute ischemic changes.  Creatinine up from baseline at 2.53 on admission.  He appears quite ill on exam with no significant increase in work of breathing but currently on 5 L nasal cannula. Echo this admission with EF 60-65%, no WMAs, normal diastolic function, moderate to severe MR.  - Holding further diuresis, renal function stable today. - Home GDMT has been held due to renal function. - Blood cultures found to be positive for MSSA. Per ID note they recommend TEE however given his current status he would not be candidate for this.  If his respiratory status were to improve we could consider this.  There is also a concern with his hemoglobin being low and platelets downtrending. - Suspect minimal and flat troponin elevation most consistent with demand/supply mismatch and not ACS in the setting of above. - Palliative medicine has been consulted to discuss goals of care with patient and son.  Appreciate their assistance.  Patient is critically ill with multiorgan failure and multiple comorbidities. Prognosis is guarded.    This patient's plan of care was discussed and created with Dr. Florencio and he is  in agreement.  Signed: Danita Bloch, PA-C  08/21/2024, 11:38 AM [facility]      "

## 2024-08-21 NOTE — Progress Notes (Signed)
 "  Date of Admission:  08/18/2024     ID: [name] is a 79 y.o. male  Principal Problem:   Failure to thrive in adult Active Problems:   T2DM (type 2 diabetes mellitus) (HCC)   Essential hypertension   OSA (obstructive sleep apnea)   CAD (coronary artery disease), s/p CABG x 3   Hyperlipidemia associated with type 2 diabetes mellitus (HCC)   Paroxysmal atrial fibrillation (HCC)   Acute kidney injury superimposed on stage 3b chronic kidney disease (HCC)   Moderate mitral regurgitation   Anemia in chronic kidney disease   Acute heart failure with preserved ejection fraction (HCC)   Bacteremia due to methicillin susceptible Staphylococcus aureus (MSSA)   History of transcatheter aortic valve replacement (TAVR)    Subjective: C/o pain neck and rt shoulder area Went for Ct scan  Has sob due to pain Did not want to get TEE today  Medications:   acetaminophen   650 mg Oral TID   Or   acetaminophen   650 mg Rectal TID   ferrous sulfate   325 mg Oral Daily   Gerhardt's butt cream   Topical TID   insulin  aspart  0-15 Units Subcutaneous TID WC   insulin  aspart  0-5 Units Subcutaneous QHS   methocarbamol   500 mg Oral Q6H   pantoprazole   40 mg Oral Daily   sodium chloride  flush  3 mL Intravenous Q12H    Objective: Vital signs in last 24 hours: Patient Vitals for the past 24 hrs:  BP Temp Temp src Pulse Resp SpO2 Weight  08/21/24 1212 99/60 97.9 F (36.6 C) Axillary 70 15 98 % --  08/21/24 1155 (!) 178/93 97.8 F (36.6 C) Oral 70 20 97 % --  08/21/24 0734 123/72 (!) 97.4 F (36.3 C) Axillary 77 20 93 % --  08/21/24 0500 -- -- -- -- -- -- 93.8 kg  08/21/24 0238 (!) 113/57 97.8 F (36.6 C) -- 79 (!) 22 97 % --  08/20/24 2327 (!) 106/55 (!) 97.5 F (36.4 C) -- 74 20 98 % --  08/20/24 2026 107/60 (!) 97.5 F (36.4 C) -- 79 20 100 % --  08/20/24 1641 119/66 (!) 97.4 F (36.3 C) -- 70 20 98 % --  08/20/24 1440 103/61 97.6 F (36.4 C) -- 66 18 100 % --    PHYSICAL EXAM:   General: lying with eyes closed, talking, some distress,, chronically ill Lungs: b/l air entry. Heart: Regular rate and rhythm, no murmur, rub or gallop. Abdomen: Soft, non-tender,not distended. Bowel sounds normal. No masses Extremities:left thumb less swollen, more movt Rt shoulder more movt  Skin: No rashes or lesions. Or bruising Lymph: Cervical, supraclavicular normal. Neurologic: Grossly non-focal  Lab Results    Latest Ref Rng & Units 08/21/2024    4:48 AM 08/20/2024    4:20 AM 08/19/2024    4:25 AM  CBC  WBC 4.0 - 10.5 K/uL 8.8  11.2  14.8   Hemoglobin 13.0 - 17.0 g/dL 7.5  7.2  8.5   Hematocrit 39.0 - 52.0 % 23.0  22.9  27.1   Platelets 150 - 400 K/uL 78  78  71        Latest Ref Rng & Units 08/21/2024    4:48 AM 08/20/2024    4:20 AM 08/19/2024    4:25 AM  CMP  Glucose 70 - 99 mg/dL 792  793  788   BUN 8 - 23 mg/dL 95  88  76   Creatinine  0.61 - 1.24 mg/dL 6.92  6.94  7.33   Sodium 135 - 145 mmol/L 133  135  136   Potassium 3.5 - 5.1 mmol/L 4.4  4.8  4.8   Chloride 98 - 111 mmol/L 94  93  94   CO2 22 - 32 mmol/L 30  30  32   Calcium  8.9 - 10.3 mg/dL 9.1  9.9  89.5   Total Protein 6.5 - 8.1 g/dL   6.6   Total Bilirubin 0.0 - 1.2 mg/dL   1.9   Alkaline Phos 38 - 126 U/L   115   AST 15 - 41 U/L   23   ALT 0 - 44 U/L   15       Microbiology: [facility] MSSA 1/6 08/21/24 BC sent  Studies/Results: No results found.    Assessment/Plan: MSSA bacteremia Has TAVR On cefazolin  Need/TEE to r/o prosthetic valve endocarditis- if he does nto want to get it we will treat him with 6 weeks of IV antibiotic  Repeat blood cultures   Swollen and tender left MCP J Thumb - improving Rt shoulder pain - and neck pain- got CT scan today On Diluadid which is making him somnolent as well R/o septic arthritis D.D /pseudogout  Seen by ortho, who does not think it is septic arthritis   AKI on CKD worsening   PAF Eliquis  had been DC due to GI bleed recently     CHF Recurrent pleural  effusion Pulmonary hypertension   CAD s/p CABG and stent Was on clopidogrel  but DC due to GI bleed   Anemia H/o GI bleed  Discussed the management with patient, family member at bed side and ID pharmacist ID will not routinely see him this weekend- call if needed  "

## 2024-08-21 NOTE — Progress Notes (Signed)
 " PROGRESS NOTE    [name]  [name] DOB: [birth year] DOA: 08/18/2024 PCP: [name], MD  Subjective: No acute events overnight. Seen and examined at bedside with son present. Reports ongoing neck and shoulder pains. Reports generalized fatigue/weakness. Reports still having intermittent shortness of breath which is better than before. Denies nausea, vomiting, constipation.    Hospital Course:  79 y.o. year old male with medical history of hypertension, hyperlipidemia complicated by CAD status post CABG, type 2 diabetes, CHF (EF 60-65%) and 02/2024, CKD 3B, and severe pulmonary hypertension presenting to the ED with increasing weakness, and dyspnea. Patient with multiple recent admissions with the most recent admission from 08/10/2024 - 08/15/2024 for acute heart failure exacerbation. States he has been weak and having difficulty with ambulation. He is also having pain in his hand along with his shoulders. He denies any URI symptoms but states his breathing has worsened since leaving the hospital. Denies any fevers or chills. On arrival to the ED patient was noted to be HDS stable. Lab work and imaging obtained. CBC with moderate leukocytosis at 16.9, hemoglobin at baseline at 8.6, thrombocytopenia that is lower than recent platelet count. CMP with moderate hyperglycemia, AKI on CKD 3B, hypercalcemia at 11.2, elevated ALP and bilirubin level. Respiratory panel negative for COVID, flu, RSV. Troponin mildly elevated but downtrending. proBNP significantly elevated from 8 days ago and has doubled since then going from 15k to 28k. Chest x-ray with diffuse interstitial prominence concerning for edema along with moderate right pleural effusion and small left pleural effusion. [facility] contacted for admission.    Assessment and Plan:  Recurrent acute hypoxic respiratory failure Acute CHFpEF exacerbation  Aortic stenosis status post TAVR - coming in with worsening dyspnea and volume overload.   - currently on 4-5L, at home on 3L oxygen  - Repeat echo showed LVEF 60-65%, no WMAs, normal diastolic function, moderate to severe MR.   - holding IV Furosemide  60 mg BID given AKI  - status post gentle IV fluids on 1/8 by nephrology.  - hold home GDMT due to AKI - Trend BMP, follow mag (goal K>4 and Mag>2) - Strict I&Os, Daily Weights - monitor on tele - wean oxygen as tolerated - Cardiology following  - Palliative following    AKI on CKD 3B:  - Likely in the setting of venous congestion.   - Cr 3.07 < 2.66, baseline Cr unclear maybe 1.6-1.9 - stopped IV Lasix  and other GDMT  - status post gentle IV fluids on 1/8 by nephrology.   - Trend creatinine, renally dose medications and avoid nephrotoxic agents.  - nephrology following    MSSA bacteremia Left hand cellulitis - DDx includes cellulitis versus gout  - L hand xray with no acute pathology, superficial foreign body in soft tissues of L middle finger anterior to middle phalanx - LUE venous US  negative for acute DVT - Repeat TTE showed LVEF 60-65%, no WMAs, normal diastolic function, moderate to severe MR. - TEE on hold given tenuous respiratory status and patient declined study on 1/9 - Bcx 1/6 showed MSSA - Repeat blood cultures with NGTD, final read pending  - Cont cefazolin , duration TBD - ID following   Neck pain  L Shoulder Pain Osteoarthritis - low likelihood for septic joint as per orthopedic surgery - pt with left shoulder pain with ROM.  - Xray L shoulder with OA. - cont tylenol  PRN - cont PO/IV dilaudid  q4h PRN - cont robaxin  q6h for muscle stiffness as per palliative  recs - will get CT neck w/o IV contrast (given AKI)    Elevated LFTs:  May be secondary to hepatic congestion in setting of volume overload.  - IV diuresis on hold given AKI - Will trend.   Macrocytic anemia:  - Hgb 7.5 < 7.2, baseline 7-8  - folic acid  and B12 not low - status post 1 unit pRBC on 1/8 - transfuse Hgb for goal > 7 -  trend CBC  Thrombocytopenia  - platelets 78 < 71, baseline 110-150s - maybe in the setting of acute infection - no signs/symptoms of acute bleed - monitor clnically    Hypertension:  - Hold home coreg  , amlodipine     Hyperlipidemia: Holding home statin given elevated LFTs.   Paroxysmal atrial fibrillation:  - In sinus rhythm.   - Will hold home ASA given AKI .   - hold home Coreg   - cardiology following   Type 2 diabetes mellitus:  - Holding home Jardiance  given AKI - cont on SSI here.   Generalized weakness: PT and OT following, plan for SNF when medically ready  DVT prophylaxis: Place and maintain sequential compression device Start: 08/20/24 1343  SCDs   Code Status: Full Code Family Communication: updated son at bedside Disposition Plan: SNF Reason for continuing need for hospitalization: IV antibiotics, TEE on hold, monitor blood counts and renal function, cardiology recs, nephrology recs, ID recs, palliative recs  Objective: Vitals:   08/21/24 0500 08/21/24 0734 08/21/24 1155 08/21/24 1212  BP:  123/72 (!) 178/93 99/60  Pulse:  77 70 70  Resp:  20 20 15   Temp:  (!) 97.4 F (36.3 C) 97.8 F (36.6 C) 97.9 F (36.6 C)  TempSrc:  Axillary Oral Axillary  SpO2:  93% 97% 98%  Weight: 93.8 kg     Height:        Intake/Output Summary (Last 24 hours) at 08/21/2024 1457 Last data filed at 08/21/2024 0243 Gross per 24 hour  Intake 220 ml  Output 700 ml  Net -480 ml   Filed Weights   08/18/24 2037 08/20/24 0500 08/21/24 0500  Weight: 86.2 kg 85.6 kg 93.8 kg    Examination:  Physical Exam Vitals and nursing note reviewed.  Constitutional:      General: He is not in acute distress. HENT:     Head: Normocephalic and atraumatic.  Cardiovascular:     Rate and Rhythm: Normal rate and regular rhythm.     Pulses: Normal pulses.     Heart sounds: Normal heart sounds.  Abdominal:     General: Bowel sounds are normal.     Palpations: Abdomen is soft.   Neurological:     Mental Status: He is alert.     Data Reviewed: I have personally reviewed following labs and imaging studies  CBC: Recent Labs  Lab 08/15/24 0427 08/18/24 1641 08/19/24 0425 08/20/24 0420 08/21/24 0448  WBC 11.3* 16.9* 14.8* 11.2* 8.8  NEUTROABS  --  15.7*  --   --   --   HGB 8.1* 8.6* 8.5* 7.2* 7.5*  HCT 25.6* 27.4* 27.1* 22.9* 23.0*  MCV 101.2* 100.0 100.4* 98.7 95.4  PLT 112* 86* 71* 78* 78*   Basic Metabolic Panel: Recent Labs  Lab 08/15/24 0427 08/18/24 1641 08/19/24 0425 08/20/24 0420 08/21/24 0448  NA 139 136 136 135 133*  K 3.9 4.9 4.8 4.8 4.4  CL 99 94* 94* 93* 94*  CO2 34* 30 32 30 30  GLUCOSE 153* 203* 211* 206* 207*  BUN 46* 69* 76* 88* 95*  CREATININE 1.87* 2.53* 2.66* 3.05* 3.07*  CALCIUM  10.3 11.2* 10.4* 9.9 9.1  MG  --  2.0  --   --   --    GFR: Estimated Creatinine Clearance: 23.1 mL/min (A) (by C-G formula based on SCr of 3.07 mg/dL (H)). Liver Function Tests: Recent Labs  Lab 08/18/24 1641 08/19/24 0425  AST 31 23  ALT 18 15  ALKPHOS 134* 115  BILITOT 2.5* 1.9*  PROT 7.4 6.6  ALBUMIN 3.4* 3.0*   No results for input(s): LIPASE, AMYLASE in the last 168 hours. No results for input(s): AMMONIA in the last 168 hours. Coagulation Profile: No results for input(s): INR, PROTIME in the last 168 hours. Cardiac Enzymes: No results for input(s): CKTOTAL, CKMB, CKMBINDEX, TROPONINI in the last 168 hours. ProBNP, BNP (last 5 results) Recent Labs    10/16/23 1415 03/02/24 1314 07/01/24 1220 07/18/24 1707 08/10/24 0732 08/18/24 1641  PROBNP  --   --  12,167.0* 13,853.0* 15,340.0* 28,444.0*  BNP 942.6* 1,183.2*  --   --   --   --    HbA1C: No results for input(s): HGBA1C in the last 72 hours. CBG: Recent Labs  Lab 08/20/24 1253 08/20/24 1642 08/20/24 2206 08/21/24 0734 08/21/24 1201  GLUCAP 183* 116* 194* 179* 131*   Lipid Profile: No results for input(s): CHOL, HDL, LDLCALC, TRIG,  CHOLHDL, LDLDIRECT in the last 72 hours. Thyroid  Function Tests: No results for input(s): TSH, T4TOTAL, FREET4, T3FREE, THYROIDAB in the last 72 hours. Anemia Panel: Recent Labs    08/18/24 2210  VITAMINB12 1,236*  FOLATE 12.3   Sepsis Labs: No results for input(s): PROCALCITON, LATICACIDVEN in the last 168 hours.  Recent Results (from the past 240 hours)  Resp panel by RT-PCR (RSV, Flu A&B, Covid) Anterior Nasal Swab     Status: None   Collection Time: 08/18/24  4:41 PM   Specimen: Anterior Nasal Swab  Result Value Ref Range Status   SARS Coronavirus 2 by RT PCR NEGATIVE NEGATIVE Final    Comment: (NOTE) SARS-CoV-2 target nucleic acids are NOT DETECTED.  The SARS-CoV-2 RNA is generally detectable in upper respiratory specimens during the acute phase of infection. The lowest concentration of SARS-CoV-2 viral copies this assay can detect is 138 copies/mL. A negative result does not preclude SARS-Cov-2 infection and should not be used as the sole basis for treatment or other patient management decisions. A negative result may occur with  improper specimen collection/handling, submission of specimen other than nasopharyngeal swab, presence of viral mutation(s) within the areas targeted by this assay, and inadequate number of viral copies(<138 copies/mL). A negative result must be combined with clinical observations, patient history, and epidemiological information. The expected result is Negative.  Fact Sheet for Patients:  bloggercourse.com  Fact Sheet for Healthcare Providers:  seriousbroker.it  This test is no t yet approved or cleared by the [place]  FDA and  has been authorized for detection and/or diagnosis of SARS-CoV-2 by FDA under an Emergency Use Authorization (EUA). This EUA will remain  in effect (meaning this test can be used) for the duration of the COVID-19 declaration under Section  564(b)(1) of the Act, 21 U.S.C.section 360bbb-3(b)(1), unless the authorization is terminated  or revoked sooner.       Influenza A by PCR NEGATIVE NEGATIVE Final   Influenza B by PCR NEGATIVE NEGATIVE Final    Comment: (NOTE) The Xpert Xpress SARS-CoV-2/FLU/RSV plus assay is intended as an aid in the diagnosis of  influenza from Nasopharyngeal swab specimens and should not be used as a sole basis for treatment. Nasal washings and aspirates are unacceptable for Xpert Xpress SARS-CoV-2/FLU/RSV testing.  Fact Sheet for Patients: bloggercourse.com  Fact Sheet for Healthcare Providers: seriousbroker.it  This test is not yet approved or cleared by the [place]  FDA and has been authorized for detection and/or diagnosis of SARS-CoV-2 by FDA under an Emergency Use Authorization (EUA). This EUA will remain in effect (meaning this test can be used) for the duration of the COVID-19 declaration under Section 564(b)(1) of the Act, 21 U.S.C. section 360bbb-3(b)(1), unless the authorization is terminated or revoked.     Resp Syncytial Virus by PCR NEGATIVE NEGATIVE Final    Comment: (NOTE) Fact Sheet for Patients: bloggercourse.com  Fact Sheet for Healthcare Providers: seriousbroker.it  This test is not yet approved or cleared by the [place]  FDA and has been authorized for detection and/or diagnosis of SARS-CoV-2 by FDA under an Emergency Use Authorization (EUA). This EUA will remain in effect (meaning this test can be used) for the duration of the COVID-19 declaration under Section 564(b)(1) of the Act, 21 U.S.C. section 360bbb-3(b)(1), unless the authorization is terminated or revoked.  Performed at [facility], [address] [place], [place] [address]   Culture, blood (Routine X 2) w Reflex to ID Panel     Status: Abnormal   Collection Time: 08/18/24 10:33 PM    Specimen: BLOOD  Result Value Ref Range Status   Specimen Description   Final    BLOOD RIGHT ANTECUBITAL Performed at [facility], [address] [place], [place] [address]    Special Requests   Final    BOTTLES DRAWN AEROBIC AND ANAEROBIC Blood Culture adequate volume Performed at [facility], [address] [place], [place] [address]    Culture  Setup Time   Final    GRAM POSITIVE COCCI IN BOTH AEROBIC AND ANAEROBIC BOTTLES CRITICAL RESULT CALLED TO, READ BACK BY AND VERIFIED WITH: ESTILL LUTES 08/19/24 0915 MW Performed at [facility], [address] [place], [place] [address]    Culture STAPHYLOCOCCUS AUREUS (A)  Final   Report Status 08/21/2024 FINAL  Final   Organism ID, Bacteria STAPHYLOCOCCUS AUREUS  Final      Susceptibility   Staphylococcus aureus - MIC*    CIPROFLOXACIN <=0.5 SENSITIVE Sensitive     ERYTHROMYCIN RESISTANT Resistant     GENTAMICIN <=0.5 SENSITIVE Sensitive     OXACILLIN <=0.25 SENSITIVE Sensitive     TETRACYCLINE >=16 RESISTANT Resistant     VANCOMYCIN 1 SENSITIVE Sensitive     TRIMETH/SULFA <=10 SENSITIVE Sensitive     CLINDAMYCIN RESISTANT Resistant     RIFAMPIN <=0.5 SENSITIVE Sensitive     Inducible Clindamycin POSITIVE Resistant     LINEZOLID 2 SENSITIVE Sensitive     * STAPHYLOCOCCUS AUREUS  Culture, blood (Routine X 2) w Reflex to ID Panel     Status: None (Preliminary result)   Collection Time: 08/18/24 10:33 PM   Specimen: BLOOD  Result Value Ref Range Status   Specimen Description BLOOD BLOOD RIGHT ARM  Final   Special Requests   Final    BOTTLES DRAWN AEROBIC AND ANAEROBIC Blood Culture adequate volume   Culture   Final    NO GROWTH 2 DAYS Performed at [facility], [address] [place], [place] [address]    Report Status PENDING  Incomplete  Blood Culture ID Panel (Reflexed)     Status:  Abnormal   Collection Time: 08/18/24 10:33 PM  Result Value Ref Range Status   Enterococcus  faecalis NOT DETECTED NOT DETECTED Final   Enterococcus Faecium NOT DETECTED NOT DETECTED Final   Listeria monocytogenes NOT DETECTED NOT DETECTED Final   Staphylococcus species DETECTED (A) NOT DETECTED Final    Comment: CRITICAL RESULT CALLED TO, READ BACK BY AND VERIFIED WITH: SHEEMA HALLAJI 08/19/24 0915 MW    Staphylococcus aureus (BCID) DETECTED (A) NOT DETECTED Final    Comment: CRITICAL RESULT CALLED TO, READ BACK BY AND VERIFIED WITH: SHEEMA HALLAJI 08/19/24 0915 MW    Staphylococcus epidermidis NOT DETECTED NOT DETECTED Final   Staphylococcus lugdunensis NOT DETECTED NOT DETECTED Final   Streptococcus species NOT DETECTED NOT DETECTED Final   Streptococcus agalactiae NOT DETECTED NOT DETECTED Final   Streptococcus pneumoniae NOT DETECTED NOT DETECTED Final   Streptococcus pyogenes NOT DETECTED NOT DETECTED Final   A.calcoaceticus-baumannii NOT DETECTED NOT DETECTED Final   Bacteroides fragilis NOT DETECTED NOT DETECTED Final   Enterobacterales NOT DETECTED NOT DETECTED Final   Enterobacter cloacae complex NOT DETECTED NOT DETECTED Final   Escherichia coli NOT DETECTED NOT DETECTED Final   Klebsiella aerogenes NOT DETECTED NOT DETECTED Final   Klebsiella oxytoca NOT DETECTED NOT DETECTED Final   Klebsiella pneumoniae NOT DETECTED NOT DETECTED Final   Proteus species NOT DETECTED NOT DETECTED Final   Salmonella species NOT DETECTED NOT DETECTED Final   Serratia marcescens NOT DETECTED NOT DETECTED Final   Haemophilus influenzae NOT DETECTED NOT DETECTED Final   Neisseria meningitidis NOT DETECTED NOT DETECTED Final   Pseudomonas aeruginosa NOT DETECTED NOT DETECTED Final   Stenotrophomonas maltophilia NOT DETECTED NOT DETECTED Final   Candida albicans NOT DETECTED NOT DETECTED Final   Candida auris NOT DETECTED NOT DETECTED Final   Candida glabrata NOT DETECTED NOT DETECTED Final   Candida krusei NOT DETECTED NOT DETECTED Final   Candida parapsilosis NOT DETECTED NOT DETECTED  Final   Candida tropicalis NOT DETECTED NOT DETECTED Final   Cryptococcus neoformans/gattii NOT DETECTED NOT DETECTED Final   Meth resistant mecA/C and MREJ NOT DETECTED NOT DETECTED Final    Comment: Performed at [facility], [address] [place], [place] [address]  Culture, blood (Routine X 2) w Reflex to ID Panel     Status: None (Preliminary result)   Collection Time: 08/19/24 11:34 AM   Specimen: BLOOD  Result Value Ref Range Status   Specimen Description BLOOD BLOOD RIGHT WRIST  Final   Special Requests   Final    BOTTLES DRAWN AEROBIC AND ANAEROBIC Blood Culture adequate volume   Culture   Final    NO GROWTH 2 DAYS Performed at [facility], [address] [place], [place] [address]    Report Status PENDING  Incomplete  Culture, blood (Routine X 2) w Reflex to ID Panel     Status: None (Preliminary result)   Collection Time: 08/21/24  4:48 AM   Specimen: BLOOD  Result Value Ref Range Status   Specimen Description BLOOD BLOOD RIGHT WRIST  Final   Special Requests   Final    BOTTLES DRAWN AEROBIC AND ANAEROBIC Blood Culture adequate volume   Culture   Final    NO GROWTH <12 HOURS Performed at [facility], [address] [place], [place] [address]    Report Status PENDING  Incomplete  Culture, blood (Routine X 2) w Reflex to ID Panel     Status: None (Preliminary result)   Collection  Time: 08/21/24  4:48 AM   Specimen: BLOOD  Result Value Ref Range Status   Specimen Description BLOOD BLOOD RIGHT HAND  Final   Special Requests   Final    BOTTLES DRAWN AEROBIC AND ANAEROBIC Blood Culture adequate volume   Culture   Final    NO GROWTH <12 HOURS Performed at [facility], [address], [place], [place] [address]    Report Status PENDING  Incomplete     Radiology Studies: No results found.  Scheduled Meds:  acetaminophen   650 mg Oral TID   Or   acetaminophen   650 mg Rectal TID   ferrous sulfate   325 mg Oral Daily    Gerhardt's butt cream   Topical TID   insulin  aspart  0-15 Units Subcutaneous TID WC   insulin  aspart  0-5 Units Subcutaneous QHS   methocarbamol   500 mg Oral Q6H   pantoprazole   40 mg Oral Daily   sodium chloride  flush  3 mL Intravenous Q12H   Continuous Infusions:   ceFAZolin  (ANCEF ) IV 2 g (08/21/24 0946)     LOS: 3 days   Norval Bar, MD  [facility]  08/21/2024, 2:57 PM   "

## 2024-08-21 NOTE — Progress Notes (Signed)
 PT Cancellation Note  Patient Details Name: [name] MRN: [id number] DOB: 10-03-1945   Cancelled Treatment:    Reason Eval/Treat Not Completed: Other (comment) Pt currently NPO, pending TEE this AM. RN requests PT hold at this time, PT will continue to follow and check back as able.   Maryanne Finder, PT, DPT Physical Therapist - [facility]  [facility]  Bradford Cazier A Danyell Shader 08/21/2024, 10:24 AM

## 2024-08-21 NOTE — TOC Progression Note (Addendum)
 Transition of Care [facility]) - Progression Note    Patient Details  Name: [name] MRN: [id number] Date of Birth: [birth year]  [facility] CM/SW Contact  Lauraine JAYSON Carpen, LCSW Phone Number: 08/21/2024, 9:59 AM  Clinical Narrative:   No SNF bed offers this morning. [facility] has not responded but is not in network with [facility]. [facility] only takes [employer] if they have out of network benefits. Asked admissions coordinator to review referral. [facility] will review. [facility], [address], and [facility] have declined.  11:15 am: [facility] is able to offer a bed. Per ID pharmacist, patient will likely need IV Cefazolin  for 4-6 at discharge. [facility] admissions coordinator is aware.  12:08 pm: [facility] said if they offer patient a bed, he will have a copay of $20 per day for days 1-20 and they will likely require 14 days up front ($280). Days 21-100 would be covered 100%. [facility] is checking to see if this would be the same for them.  3:42 pm: [facility] said patient will have copay of $20 per days for days 1-20 and they would require 7 days up front ($140). The cost would go up for days 21-100, per usual. CSW called [facility] admissions coordinator and confirmed again that he would be covered at 100% for days 21-100. CSW met with daughter-in-law and provided bed offers with the financial information.  Expected Discharge Plan: Skilled Nursing Facility Barriers to Discharge: Continued Medical Work up               Expected Discharge Plan and Services       Living arrangements for the past 2 months: Mobile Home                                       Social Drivers of Health (SDOH) Interventions SDOH Screenings   Food Insecurity: No Food Insecurity (08/20/2024)  Housing: Low Risk (08/20/2024)  Transportation Needs: No Transportation Needs (08/20/2024)  Utilities: Not At Risk (08/20/2024)  Depression (PHQ2-9): Low Risk (07/16/2024)   Financial Resource Strain: Low Risk  (05/08/2024)   Received from [facility]  Social Connections: Unknown (08/20/2024)  Recent Concern: Social Connections - Moderately Isolated (07/18/2024)  Tobacco Use: Medium Risk (08/10/2024)    Readmission Risk Interventions    08/20/2024    3:06 PM 08/12/2024    1:40 PM 07/20/2024   12:29 PM  Readmission Risk Prevention Plan  Transportation Screening Complete Complete Complete  PCP or Specialist Appt within 3-5 Days   --  HRI or Home Care Consult   Complete  Social Work Consult for Recovery Care Planning/Counseling   Complete  Medication Review [employer]) Complete Complete Complete  PCP or Specialist appointment within 3-5 days of discharge Complete Complete   HRI or Home Care Consult Complete Complete   SW Recovery Care/Counseling Consult  Complete   Palliative Care Screening Not Applicable Not Applicable   Skilled Nursing Facility Complete Complete

## 2024-08-22 DIAGNOSIS — Y92009 Unspecified place in unspecified non-institutional (private) residence as the place of occurrence of the external cause: Secondary | ICD-10-CM

## 2024-08-22 DIAGNOSIS — R7881 Bacteremia: Secondary | ICD-10-CM | POA: Diagnosis not present

## 2024-08-22 DIAGNOSIS — Z952 Presence of prosthetic heart valve: Secondary | ICD-10-CM | POA: Diagnosis not present

## 2024-08-22 DIAGNOSIS — W19XXXA Unspecified fall, initial encounter: Secondary | ICD-10-CM

## 2024-08-22 DIAGNOSIS — G4733 Obstructive sleep apnea (adult) (pediatric): Secondary | ICD-10-CM

## 2024-08-22 DIAGNOSIS — I5031 Acute diastolic (congestive) heart failure: Secondary | ICD-10-CM | POA: Diagnosis not present

## 2024-08-22 DIAGNOSIS — N184 Chronic kidney disease, stage 4 (severe): Secondary | ICD-10-CM | POA: Diagnosis not present

## 2024-08-22 DIAGNOSIS — I480 Paroxysmal atrial fibrillation: Secondary | ICD-10-CM | POA: Diagnosis not present

## 2024-08-22 DIAGNOSIS — I25118 Atherosclerotic heart disease of native coronary artery with other forms of angina pectoris: Secondary | ICD-10-CM | POA: Diagnosis not present

## 2024-08-22 DIAGNOSIS — R627 Adult failure to thrive: Secondary | ICD-10-CM | POA: Diagnosis not present

## 2024-08-22 DIAGNOSIS — Z515 Encounter for palliative care: Secondary | ICD-10-CM | POA: Diagnosis not present

## 2024-08-22 DIAGNOSIS — N179 Acute kidney failure, unspecified: Secondary | ICD-10-CM | POA: Diagnosis not present

## 2024-08-22 DIAGNOSIS — I509 Heart failure, unspecified: Secondary | ICD-10-CM | POA: Diagnosis not present

## 2024-08-22 LAB — COMPREHENSIVE METABOLIC PANEL WITH GFR
ALT: <5 U/L (ref 0–44)
AST: 16 U/L (ref 15–41)
Albumin: 2.7 g/dL — ABNORMAL LOW (ref 3.5–5.0)
Alkaline Phosphatase: 112 U/L (ref 38–126)
Anion gap: 8 (ref 5–15)
BUN: 93 mg/dL — ABNORMAL HIGH (ref 8–23)
CO2: 31 mmol/L (ref 22–32)
Calcium: 9.6 mg/dL (ref 8.9–10.3)
Chloride: 95 mmol/L — ABNORMAL LOW (ref 98–111)
Creatinine, Ser: 2.85 mg/dL — ABNORMAL HIGH (ref 0.61–1.24)
GFR, Estimated: 22 mL/min — ABNORMAL LOW
Glucose, Bld: 180 mg/dL — ABNORMAL HIGH (ref 70–99)
Potassium: 4.5 mmol/L (ref 3.5–5.1)
Sodium: 134 mmol/L — ABNORMAL LOW (ref 135–145)
Total Bilirubin: 1.3 mg/dL — ABNORMAL HIGH (ref 0.0–1.2)
Total Protein: 6.4 g/dL — ABNORMAL LOW (ref 6.5–8.1)

## 2024-08-22 LAB — CBC
HCT: 25.8 % — ABNORMAL LOW (ref 39.0–52.0)
Hemoglobin: 8.5 g/dL — ABNORMAL LOW (ref 13.0–17.0)
MCH: 31.1 pg (ref 26.0–34.0)
MCHC: 32.9 g/dL (ref 30.0–36.0)
MCV: 94.5 fL (ref 80.0–100.0)
Platelets: 100 K/uL — ABNORMAL LOW (ref 150–400)
RBC: 2.73 MIL/uL — ABNORMAL LOW (ref 4.22–5.81)
RDW: 17.1 % — ABNORMAL HIGH (ref 11.5–15.5)
WBC: 9.9 K/uL (ref 4.0–10.5)
nRBC: 0 % (ref 0.0–0.2)

## 2024-08-22 LAB — GLUCOSE, CAPILLARY: Glucose-Capillary: 170 mg/dL — ABNORMAL HIGH (ref 70–99)

## 2024-08-22 MED ORDER — HYDROMORPHONE HCL 2 MG PO TABS: 2.0000 mg | ORAL_TABLET | ORAL | Status: DC | PRN

## 2024-08-22 MED ORDER — GLYCOPYRROLATE 0.2 MG/ML IJ SOLN: 0.2000 mg | INTRAMUSCULAR | Status: DC | PRN

## 2024-08-22 MED ORDER — HALOPERIDOL LACTATE 5 MG/ML IJ SOLN: 2.0000 mg | Freq: Four times a day (QID) | INTRAMUSCULAR | Status: DC | PRN

## 2024-08-22 MED ORDER — GLYCOPYRROLATE 1 MG PO TABS: 1.0000 mg | ORAL_TABLET | ORAL | Status: DC | PRN

## 2024-08-22 MED ORDER — HALOPERIDOL LACTATE 2 MG/ML PO CONC: 2.0000 mg | Freq: Four times a day (QID) | ORAL | Status: DC | PRN

## 2024-08-22 MED ORDER — HYDROMORPHONE HCL 1 MG/ML IJ SOLN: 0.5000 mg | INTRAMUSCULAR | Status: DC | PRN

## 2024-08-22 MED ORDER — LORAZEPAM 2 MG/ML IJ SOLN: 1.0000 mg | INTRAMUSCULAR | Status: DC | PRN

## 2024-08-22 MED ORDER — DIPHENHYDRAMINE HCL 50 MG/ML IJ SOLN: 25.0000 mg | INTRAMUSCULAR | Status: DC | PRN

## 2024-08-22 MED ORDER — HALOPERIDOL 2 MG PO TABS: 2.0000 mg | ORAL_TABLET | Freq: Four times a day (QID) | ORAL | Status: DC | PRN

## 2024-08-22 MED ORDER — HYDROMORPHONE HCL 1 MG/ML IJ SOLN
1.0000 mg | INTRAMUSCULAR | Status: DC | PRN
  Administered 2024-08-22: 1 mg via INTRAVENOUS
  Filled 2024-08-22: qty 1

## 2024-08-22 MED ORDER — MORPHINE 100MG IN NS 100ML (1MG/ML) PREMIX INFUSION
1.0000 mg/h | INTRAVENOUS | Status: DC
  Administered 2024-08-22: 1 mg/h via INTRAVENOUS
  Filled 2024-08-22: qty 100

## 2024-08-22 MED ORDER — MORPHINE BOLUS VIA INFUSION: 1.0000 mg | INTRAVENOUS | Status: DC | PRN

## 2024-08-22 MED ORDER — POLYVINYL ALCOHOL 1.4 % OP SOLN: 1.0000 [drp] | Freq: Four times a day (QID) | OPHTHALMIC | Status: DC | PRN

## 2024-08-22 MED ORDER — BIOTENE DRY MOUTH MT LIQD: 15.0000 mL | Freq: Two times a day (BID) | OROMUCOSAL | Status: DC

## 2024-08-22 NOTE — Progress Notes (Signed)
 "                                                                                                                                                                                               Palliative Care Progress Note, Assessment & Plan   Patient Name: [name]       Date: 08/22/2024 DOB: [birth year]  Age: 79 y.o. MRN#: [id number] Attending Physician: Cosette Blackwater, MD Primary Care Physician: [name], MD Admit Date: 08/18/2024  Subjective: Unable to assess   HPI: 79 y.o. male  with past medical history of HTN, HLD, CAD s/p CABG, type 2 diabetes, CKD (stage IIIb), CHF (recent EF 60-65%), and severe pulmonary HTN admitted on 08/18/2024 with increasing weakness and dyspnea.   Patient has had 4 inpatient admissions and 1 ED visit in the last 6 months.  Most recent admission was 1229-08/15/2024 for acute exacerbation of heart failure.   Upon presentation to the ED, patient endorses he was weak and having difficulty with ambulation as well as pain in his hand/shoulder on his left side.  Vital signs were within normal limits and patient was hemodynamically stable.  Lab work revealed moderate leukocytosis-16.9, hemoglobin at baseline 8.6, AKI on CKD with creatinine 2.  5 3, WBC mildly elevated at 14.8.  Blood cultures revealed MSSA patient currently on cefazolin .  Radiographs of left hand revealed small foreign body in soft tissues of left middle finger anterior to the middle phalanx.   Patient is current being treated for failure to thrive, acute hypoxic respiratory failure, acute on chronic HFpEF, AKI on CKD, recurrent pleural effusions, MSSA bacteremia, chronic anemia, demand ischemia.   Patient is being followed by infectious disease, cardiology, PT/OT.   PMT was consulted to support patient and family with goals of care discussions   Following up today for pain management of severe LUE and neck pain.   Summary of counseling/coordination of care Chart reviewed:   Labs: Slight  improvement in renal function.     Latest Ref Rng & Units 08/22/2024    5:52 AM 08/21/2024    4:48 AM 08/20/2024    4:20 AM  CMP  Glucose 70 - 99 mg/dL 819  792  793   BUN 8 - 23 mg/dL 93  95  88   Creatinine 0.61 - 1.24 mg/dL 7.14  6.92  6.94   Sodium 135 - 145 mmol/L 134  133  135   Potassium 3.5 - 5.1 mmol/L 4.5  4.4  4.8   Chloride 98 - 111 mmol/L 95  94  93   CO2 22 - 32 mmol/L 31  30  30   Calcium  8.9 - 10.3 mg/dL 9.6  9.1  9.9   Total Protein 6.5 - 8.1 g/dL 6.4     Total Bilirubin 0.0 - 1.2 mg/dL 1.3     Alkaline Phos 38 - 126 U/L 112     AST 15 - 41 U/L 16     ALT 0 - 44 U/L <5      WBC WNL. Hgb slightly improved/stable. Thrombocytopenia at 100.      Latest Ref Rng & Units 08/22/2024    5:52 AM 08/21/2024    4:48 AM 08/20/2024    4:20 AM  CBC  WBC 4.0 - 10.5 K/uL 9.9  8.8  11.2   Hemoglobin 13.0 - 17.0 g/dL 8.5  7.5  7.2   Hematocrit 39.0 - 52.0 % 25.8  23.0  22.9   Platelets 150 - 400 K/uL 100  78  78     Vitals: Blood pressure 134/63, pulse 73, temperature 97.7 F (36.5 C), temperature source Axillary, resp. rate 20, height 6' 1 (1.854 m), weight 89.8 kg, SpO2 100%.   Progress notes: Reviewed progress notes from ED staff/providers, TRH, PT/OT, TOC, Neurology, Cardiology, ID and nursing staff.   Imaging: Reviewed new results from CT soft tissue neck and CT head 08/21/24 CT neck: 1. No acute findings. 2. Interval worsening of pleural thickening and loculated pleural fluid at the left lung apex. 3. Hypodense right thyroid  nodule measuring 1.9 cm, for which non-emergent thyroid  ultrasound is recommended. CT head: 1. No acute intracranial abnormality. 2. Paranasal sinus disease with right maxillary mucous retention cyst and mild frontal and ethmoid mucosal thickening. No indication for severe LUE and L neck pain.   MAR: Due to requiring frequent IV push meds, transitioned patient to morphine  gtt for comfort focused/end of life care.   ACP documents: None on file    After reviewing the patient's chart and assessing the patient at bedside, I spoke with patient's son and daughter in law at bedside in regards to symptom management and goals of care.   Ill-appearing, elderly male sleeping in bed. He does not awaken during conversation. Respirations are even and unlabored. He is in no distress. Patient son and daughter-in-law at bedside. No nursing staff present.   Discussed plan of care with Curtistine, son, and daughter-in-law, Arline. Curtistine shares that his father had an awful night with pain requiring night provider to order morphine  in addition to dilaudid . Curtistine confirms that his father has been transitioned to comfort measures only at his father's request due to continuing, unrelieved pain. He shares that his father has had significant decline in health requiring multiple hospital admissions over the past six months. He states that his father called his mother last night and told her good bye and that had requested end of life care. Patient son states he wants his father to remain comfortable and not be awake due to excruciating, unrelieved pain.   Discussed pain management with son and educated about using continuous drip pain medication where patient receives constant medication versus RN pushing IV medication every 2 hours. He expresses concern over using dilaudid  since his father had no relief with it before. Curtistine remains adamant that his father cannot wake up again due to uncontrolled pain. He requests for his father to remain sedated. Expressed concern over using morphine  in the setting of poor renal function and possible reaction of myoclonic jerking. Curtistine is amenable to continuing to use morphine  since has been effective for pain control,  understanding possibility of side effects.   Confirmed again that family wants to continue with comfort care. I explained comfort care as care where the patient would no longer receive aggressive medical  interventions such as continuous vital signs, lab work, radiology testing, or medications not focused on comfort, peace, and dignity. This includes stopping antibiotics and weaning oxygen to room air, as these are generally not accepted as providing comfort but only prolonging the dying process artificially. All care would focus on how the patient is looking and feeling. This would include management of any symptoms that may cause discomfort, pain, shortness of breath/air hunger, increased work of breathing, cough, nausea, agitation/restlessness, anxiety, and/or secretions etc. Symptoms would be managed with medications and other non-pharmacological interventions such as spiritual support if requested, repositioning, music therapy, or therapeutic listening. Family verbalized understanding and agreement.   Pt transitioned to morphine  gtt for comfort focused care.   Questioned if family had discussed possible transfer to hospice home. They share they had not considered but were wanting more information about hospice home. After discussion, it was determined that home with hospice is not an option due family inability to care for patient in the home setting.   TOC order placed for patient to be evaluated by [facility] liaison for possible [facility] hosp[facility] care.   Therapeutic silence and active listening provided for family to share their thoughts and emotions regarding current medical situation.  Emotional support provided.  Physical Exam Vitals and nursing note reviewed.  Constitutional:      General: He is not in acute distress.    Appearance: He is ill-appearing.     Comments: Chronically ill appearing   HENT:     Head: Normocephalic and atraumatic.  Pulmonary:     Effort: Pulmonary effort is normal. No respiratory distress.     Comments: O2 via [place] Musculoskeletal:     Right lower leg: No edema.     Left lower leg: No edema.            Recommendations/Plan:         Focus of care is comfort and dignity  allowing for natural death Utilize ordered medications to maintain comfort Plan to transfer to [facility] hospice tomorrow  Discussed plan of care with primary RN, Leon, Dr, Cosette, TOC and Saddie HERO, [facility] liaison   I personally spent a total of 65 minutes in the care of the patient today including preparing to see the patient, getting/reviewing separately obtained history, performing a medically appropriate exam/evaluation, counseling and educating, placing orders, referring and communicating with other health care professionals, documenting clinical information in the EHR, independently interpreting results, communicating results, and coordinating care.    Devere Sacks, AMANDA [facility] Palliative Medicine Team  08/22/2024 5:05 PM  Office [phone]  Pager [phone]     "

## 2024-08-22 NOTE — Progress Notes (Signed)
 PT switched to comfort care. At this time family doesn't want pt repositioned.

## 2024-08-22 NOTE — Progress Notes (Signed)
 " PROGRESS NOTE    [name]  [name] DOB: 1945/11/02 DOA: 08/18/2024 PCP: [name], MD  Subjective: Noted to have acute intractable bilateral shoulder and neck pains with only temporary relief with meds. Informed by nursing of patient requesting to transition to comfort care measures only from overnight nurses. Seen and examined at bedside with son present. Patient lethargic and unable to provide any history at present. Given pain meds recently and son reports he is comfortable at present even though he's sleepy.   Hospital Course: 79 y.o. year old male with medical history of hypertension, hyperlipidemia complicated by CAD status post CABG, type 2 diabetes, CHF (EF 60-65%) and 02/2024, CKD 3B, and severe pulmonary hypertension presenting to the ED with increasing weakness, and dyspnea. Patient with multiple recent admissions with the most recent admission from 08/10/2024 - 08/15/2024 for acute heart failure exacerbation. States he has been weak and having difficulty with ambulation. He is also having pain in his hand along with his shoulders. He denies any URI symptoms but states his breathing has worsened since leaving the hospital. Denies any fevers or chills. On arrival to the ED patient was noted to be HDS stable. Lab work and imaging obtained. CBC with moderate leukocytosis at 16.9, hemoglobin at baseline at 8.6, thrombocytopenia that is lower than recent platelet count. CMP with moderate hyperglycemia, AKI on CKD 3B, hypercalcemia at 11.2, elevated ALP and bilirubin level. Respiratory panel negative for COVID, flu, RSV. Troponin mildly elevated but downtrending. proBNP significantly elevated from 8 days ago and has doubled since then going from 15k to 28k. Chest x-ray with diffuse interstitial prominence concerning for edema along with moderate right pleural effusion and small left pleural effusion. [facility] contacted for admission.    Assessment and Plan:  Recurrent acute hypoxic  respiratory failure Acute CHFpEF exacerbation  MSSA bacteremia Left hand cellulitis Neck pain  L Shoulder Pain Osteoarthritis Transaminitis Macrocytic anemia Thrombocytopenia AKI on CKD 3B  Aortic stenosis status post TAVR Advanced care planning  Patient with known severe CHFpEF admitted for fluid overload state and MSSA L hand cellulitis with bacteremia who developed AKI on CKD with aggressive diuresis. Patient continues to have poor prognosis with ongoing respiratory distress and intractable acute on chronic pains. He remains a poor candidate for aggressive therapies and diagnostic procedures given his tenuous respiratory status. Patient not improving on IV cefazolin .   Discussed poor prognosis with patient's son at bedside. Patient unable to participate in advance care talks at present as he is very sleepy at present. Son understands the severity of illness and poor prognosis. Son states clearly that the patient overnight was clear in his wishes to transition to comfort care measures only as he has not been improving. Son reports the patient called his wife and other family members overnight to say hig goodbyes and reported to them all that he wants to transition to comfort measures only at this time. Will transition to comfort care measures only at this time. Comfort care form signed and given to nursing staff. Nursing, and Palliative care team updated and also evaluated the patient. Hospice team engaged by palliative care team. I spent a total of 20 minutes discussing goals of care with patient's son at bedside today.  DVT prophylaxis:   None because of comfort care   Code Status: Do not attempt resuscitation (DNR) - Comfort care Family Communication: updated son at bedside Disposition Plan: Inpatient Hospice Reason for continuing need for hospitalization: Acute symptomatic management  Objective: Vitals:  08/21/24 2350 08/22/24 0441 08/22/24 0500 08/22/24 0810  BP: 117/63 137/79   134/63  Pulse: 83 73  73  Resp: 20 20    Temp: 97.8 F (36.6 C) 97.8 F (36.6 C)  97.7 F (36.5 C)  TempSrc:    Axillary  SpO2: 97% 99%  100%  Weight:   89.8 kg   Height:        Intake/Output Summary (Last 24 hours) at 08/22/2024 1435 Last data filed at 08/21/2024 2300 Gross per 24 hour  Intake --  Output 500 ml  Net -500 ml   Filed Weights   08/20/24 0500 08/21/24 0500 08/22/24 0500  Weight: 85.6 kg 93.8 kg 89.8 kg    Examination:  Physical Exam Vitals and nursing note reviewed.  Constitutional:      General: He is in acute distress.     Appearance: He is ill-appearing.     Comments: Somnolent, Weak, frail  HENT:     Head: Normocephalic and atraumatic.  Cardiovascular:     Rate and Rhythm: Normal rate and regular rhythm.     Pulses: Normal pulses.     Heart sounds: Normal heart sounds.  Pulmonary:     Effort: Pulmonary effort is normal.     Breath sounds: Normal breath sounds.  Abdominal:     General: Bowel sounds are normal.     Palpations: Abdomen is soft.     Data Reviewed: I have personally reviewed following labs and imaging studies  CBC: Recent Labs  Lab 08/18/24 1641 08/19/24 0425 08/20/24 0420 08/21/24 0448 08/22/24 0552  WBC 16.9* 14.8* 11.2* 8.8 9.9  NEUTROABS 15.7*  --   --   --   --   HGB 8.6* 8.5* 7.2* 7.5* 8.5*  HCT 27.4* 27.1* 22.9* 23.0* 25.8*  MCV 100.0 100.4* 98.7 95.4 94.5  PLT 86* 71* 78* 78* 100*   Basic Metabolic Panel: Recent Labs  Lab 08/18/24 1641 08/19/24 0425 08/20/24 0420 08/21/24 0448 08/22/24 0552  NA 136 136 135 133* 134*  K 4.9 4.8 4.8 4.4 4.5  CL 94* 94* 93* 94* 95*  CO2 30 32 30 30 31   GLUCOSE 203* 211* 206* 207* 180*  BUN 69* 76* 88* 95* 93*  CREATININE 2.53* 2.66* 3.05* 3.07* 2.85*  CALCIUM  11.2* 10.4* 9.9 9.1 9.6  MG 2.0  --   --   --   --    GFR: Estimated Creatinine Clearance: 24.9 mL/min (A) (by C-G formula based on SCr of 2.85 mg/dL (H)). Liver Function Tests: Recent Labs  Lab 08/18/24 1641  08/19/24 0425 08/22/24 0552  AST 31 23 16   ALT 18 15 <5  ALKPHOS 134* 115 112  BILITOT 2.5* 1.9* 1.3*  PROT 7.4 6.6 6.4*  ALBUMIN 3.4* 3.0* 2.7*   No results for input(s): LIPASE, AMYLASE in the last 168 hours. No results for input(s): AMMONIA in the last 168 hours. Coagulation Profile: No results for input(s): INR, PROTIME in the last 168 hours. Cardiac Enzymes: No results for input(s): CKTOTAL, CKMB, CKMBINDEX, TROPONINI in the last 168 hours. ProBNP, BNP (last 5 results) Recent Labs    10/16/23 1415 03/02/24 1314 07/01/24 1220 07/18/24 1707 08/10/24 0732 08/18/24 1641  PROBNP  --   --  12,167.0* 13,853.0* 15,340.0* 28,444.0*  BNP 942.6* 1,183.2*  --   --   --   --    HbA1C: No results for input(s): HGBA1C in the last 72 hours. CBG: Recent Labs  Lab 08/21/24 0734 08/21/24 1201 08/21/24 1627 08/21/24 2020  08/22/24 0809  GLUCAP 179* 131* 203* 167* 170*   Lipid Profile: No results for input(s): CHOL, HDL, LDLCALC, TRIG, CHOLHDL, LDLDIRECT in the last 72 hours. Thyroid  Function Tests: No results for input(s): TSH, T4TOTAL, FREET4, T3FREE, THYROIDAB in the last 72 hours. Anemia Panel: No results for input(s): VITAMINB12, FOLATE, FERRITIN, TIBC, IRON , RETICCTPCT in the last 72 hours. Sepsis Labs: No results for input(s): PROCALCITON, LATICACIDVEN in the last 168 hours.  Recent Results (from the past 240 hours)  Resp panel by RT-PCR (RSV, Flu A&B, Covid) Anterior Nasal Swab     Status: None   Collection Time: 08/18/24  4:41 PM   Specimen: Anterior Nasal Swab  Result Value Ref Range Status   SARS Coronavirus 2 by RT PCR NEGATIVE NEGATIVE Final    Comment: (NOTE) SARS-CoV-2 target nucleic acids are NOT DETECTED.  The SARS-CoV-2 RNA is generally detectable in upper respiratory specimens during the acute phase of infection. The lowest concentration of SARS-CoV-2 viral copies this assay can detect is 138  copies/mL. A negative result does not preclude SARS-Cov-2 infection and should not be used as the sole basis for treatment or other patient management decisions. A negative result may occur with  improper specimen collection/handling, submission of specimen other than nasopharyngeal swab, presence of viral mutation(s) within the areas targeted by this assay, and inadequate number of viral copies(<138 copies/mL). A negative result must be combined with clinical observations, patient history, and epidemiological information. The expected result is Negative.  Fact Sheet for Patients:  bloggercourse.com  Fact Sheet for Healthcare Providers:  seriousbroker.it  This test is no t yet approved or cleared by the [place]  FDA and  has been authorized for detection and/or diagnosis of SARS-CoV-2 by FDA under an Emergency Use Authorization (EUA). This EUA will remain  in effect (meaning this test can be used) for the duration of the COVID-19 declaration under Section 564(b)(1) of the Act, 21 U.S.C.section 360bbb-3(b)(1), unless the authorization is terminated  or revoked sooner.       Influenza A by PCR NEGATIVE NEGATIVE Final   Influenza B by PCR NEGATIVE NEGATIVE Final    Comment: (NOTE) The Xpert Xpress SARS-CoV-2/FLU/RSV plus assay is intended as an aid in the diagnosis of influenza from Nasopharyngeal swab specimens and should not be used as a sole basis for treatment. Nasal washings and aspirates are unacceptable for Xpert Xpress SARS-CoV-2/FLU/RSV testing.  Fact Sheet for Patients: bloggercourse.com  Fact Sheet for Healthcare Providers: seriousbroker.it  This test is not yet approved or cleared by the [place]  FDA and has been authorized for detection and/or diagnosis of SARS-CoV-2 by FDA under an Emergency Use Authorization (EUA). This EUA will remain in effect (meaning  this test can be used) for the duration of the COVID-19 declaration under Section 564(b)(1) of the Act, 21 U.S.C. section 360bbb-3(b)(1), unless the authorization is terminated or revoked.     Resp Syncytial Virus by PCR NEGATIVE NEGATIVE Final    Comment: (NOTE) Fact Sheet for Patients: bloggercourse.com  Fact Sheet for Healthcare Providers: seriousbroker.it  This test is not yet approved or cleared by the [place]  FDA and has been authorized for detection and/or diagnosis of SARS-CoV-2 by FDA under an Emergency Use Authorization (EUA). This EUA will remain in effect (meaning this test can be used) for the duration of the COVID-19 declaration under Section 564(b)(1) of the Act, 21 U.S.C. section 360bbb-3(b)(1), unless the authorization is terminated or revoked.  Performed at [facility], [address] [place],  [place] [address]   Culture, blood (Routine X 2) w Reflex to ID Panel     Status: Abnormal   Collection Time: 08/18/24 10:33 PM   Specimen: BLOOD  Result Value Ref Range Status   Specimen Description   Final    BLOOD RIGHT ANTECUBITAL Performed at [facility], [address], [place], [place] [address]    Special Requests   Final    BOTTLES DRAWN AEROBIC AND ANAEROBIC Blood Culture adequate volume Performed at [facility], [address], [place], [place] [address]    Culture  Setup Time   Final    GRAM POSITIVE COCCI IN BOTH AEROBIC AND ANAEROBIC BOTTLES CRITICAL RESULT CALLED TO, READ BACK BY AND VERIFIED WITH: ESTILL LUTES 08/19/24 0915 MW Performed at [facility], [address], [place], [place] [address]    Culture STAPHYLOCOCCUS AUREUS (A)  Final   Report Status 08/21/2024 FINAL  Final   Organism ID, Bacteria STAPHYLOCOCCUS AUREUS  Final      Susceptibility   Staphylococcus aureus - MIC*    CIPROFLOXACIN <=0.5 SENSITIVE Sensitive     ERYTHROMYCIN RESISTANT  Resistant     GENTAMICIN <=0.5 SENSITIVE Sensitive     OXACILLIN <=0.25 SENSITIVE Sensitive     TETRACYCLINE >=16 RESISTANT Resistant     VANCOMYCIN 1 SENSITIVE Sensitive     TRIMETH/SULFA <=10 SENSITIVE Sensitive     CLINDAMYCIN RESISTANT Resistant     RIFAMPIN <=0.5 SENSITIVE Sensitive     Inducible Clindamycin POSITIVE Resistant     LINEZOLID 2 SENSITIVE Sensitive     * STAPHYLOCOCCUS AUREUS  Culture, blood (Routine X 2) w Reflex to ID Panel     Status: None (Preliminary result)   Collection Time: 08/18/24 10:33 PM   Specimen: BLOOD  Result Value Ref Range Status   Specimen Description BLOOD BLOOD RIGHT ARM  Final   Special Requests   Final    BOTTLES DRAWN AEROBIC AND ANAEROBIC Blood Culture adequate volume   Culture   Final    NO GROWTH 3 DAYS Performed at [facility], [address] [place], [place] [address]    Report Status PENDING  Incomplete  Blood Culture ID Panel (Reflexed)     Status: Abnormal   Collection Time: 08/18/24 10:33 PM  Result Value Ref Range Status   Enterococcus faecalis NOT DETECTED NOT DETECTED Final   Enterococcus Faecium NOT DETECTED NOT DETECTED Final   Listeria monocytogenes NOT DETECTED NOT DETECTED Final   Staphylococcus species DETECTED (A) NOT DETECTED Final    Comment: CRITICAL RESULT CALLED TO, READ BACK BY AND VERIFIED WITH: SHEEMA HALLAJI 08/19/24 0915 MW    Staphylococcus aureus (BCID) DETECTED (A) NOT DETECTED Final    Comment: CRITICAL RESULT CALLED TO, READ BACK BY AND VERIFIED WITH: SHEEMA HALLAJI 08/19/24 0915 MW    Staphylococcus epidermidis NOT DETECTED NOT DETECTED Final   Staphylococcus lugdunensis NOT DETECTED NOT DETECTED Final   Streptococcus species NOT DETECTED NOT DETECTED Final   Streptococcus agalactiae NOT DETECTED NOT DETECTED Final   Streptococcus pneumoniae NOT DETECTED NOT DETECTED Final   Streptococcus pyogenes NOT DETECTED NOT DETECTED Final   A.calcoaceticus-baumannii NOT DETECTED NOT DETECTED Final    Bacteroides fragilis NOT DETECTED NOT DETECTED Final   Enterobacterales NOT DETECTED NOT DETECTED Final   Enterobacter cloacae complex NOT DETECTED NOT DETECTED Final   Escherichia coli NOT DETECTED NOT DETECTED Final   Klebsiella aerogenes NOT DETECTED NOT DETECTED Final   Klebsiella oxytoca NOT DETECTED NOT DETECTED  Final   Klebsiella pneumoniae NOT DETECTED NOT DETECTED Final   Proteus species NOT DETECTED NOT DETECTED Final   Salmonella species NOT DETECTED NOT DETECTED Final   Serratia marcescens NOT DETECTED NOT DETECTED Final   Haemophilus influenzae NOT DETECTED NOT DETECTED Final   Neisseria meningitidis NOT DETECTED NOT DETECTED Final   Pseudomonas aeruginosa NOT DETECTED NOT DETECTED Final   Stenotrophomonas maltophilia NOT DETECTED NOT DETECTED Final   Candida albicans NOT DETECTED NOT DETECTED Final   Candida auris NOT DETECTED NOT DETECTED Final   Candida glabrata NOT DETECTED NOT DETECTED Final   Candida krusei NOT DETECTED NOT DETECTED Final   Candida parapsilosis NOT DETECTED NOT DETECTED Final   Candida tropicalis NOT DETECTED NOT DETECTED Final   Cryptococcus neoformans/gattii NOT DETECTED NOT DETECTED Final   Meth resistant mecA/C and MREJ NOT DETECTED NOT DETECTED Final    Comment: Performed at [facility], [address] [place], [place] [address]  Culture, blood (Routine X 2) w Reflex to ID Panel     Status: None (Preliminary result)   Collection Time: 08/19/24 11:34 AM   Specimen: BLOOD  Result Value Ref Range Status   Specimen Description BLOOD BLOOD RIGHT WRIST  Final   Special Requests   Final    BOTTLES DRAWN AEROBIC AND ANAEROBIC Blood Culture adequate volume   Culture   Final    NO GROWTH 3 DAYS Performed at [facility], [address] [place], [place] [address]    Report Status PENDING  Incomplete  Culture, blood (Routine X 2) w Reflex to ID Panel     Status: None (Preliminary result)   Collection Time: 08/21/24  4:48 AM    Specimen: BLOOD  Result Value Ref Range Status   Specimen Description BLOOD BLOOD RIGHT WRIST  Final   Special Requests   Final    BOTTLES DRAWN AEROBIC AND ANAEROBIC Blood Culture adequate volume   Culture   Final    NO GROWTH 1 DAY Performed at [facility], [address], [place], [place] [address]    Report Status PENDING  Incomplete  Culture, blood (Routine X 2) w Reflex to ID Panel     Status: None (Preliminary result)   Collection Time: 08/21/24  4:48 AM   Specimen: BLOOD  Result Value Ref Range Status   Specimen Description BLOOD BLOOD RIGHT HAND  Final   Special Requests   Final    BOTTLES DRAWN AEROBIC AND ANAEROBIC Blood Culture adequate volume   Culture   Final    NO GROWTH 1 DAY Performed at [facility], [address] [place], [place] [address]    Report Status PENDING  Incomplete     Radiology Studies: CT SOFT TISSUE NECK WO CONTRAST Result Date: 08/21/2024 EXAM: CT NECK WITHOUT CONTRAST 08/21/2024 03:19:00 PM TECHNIQUE: CT of the neck was performed without the administration of intravenous contrast. Multiplanar reformatted images are provided for review. Automated exposure control, iterative reconstruction, and/or weight based adjustment of the mA/kV was utilized to reduce the radiation dose to as low as reasonably achievable. COMPARISON: CT chest abdomen and pelvis dated 07/20/2024. CLINICAL HISTORY: Soft tissue infection suspected, neck, no prior imaging. FINDINGS: AERODIGESTIVE TRACT: No discrete mass. No edema. SALIVARY GLANDS: The parotid and submandibular glands are unremarkable. THYROID : Hypodense right thyroid  nodule measuring 1.9 cm. LYMPH NODES: No suspicious cervical lymphadenopathy. SOFT TISSUES: No mass or fluid collection. BONES: No abnormality. OTHER: Visualized sinuses and mastoid air cells are well aerated. Pleural thickening  and loculated pleural fluid at the left lung apex worsened from 07/20/2024 CT chest abdomen and pelvis. Calcific  aortic atherosclerosis. IMPRESSION: 1. No acute findings. 2. Interval worsening of pleural thickening and loculated pleural fluid at the left lung apex. 3. Hypodense right thyroid  nodule measuring 1.9 cm, for which non-emergent thyroid  ultrasound is recommended. Electronically signed by: Franky Stanford MD MD 08/21/2024 08:30 PM EST RP Workstation: [id number]   CT HEAD WO CONTRAST ( ) Result Date: 08/21/2024 EXAM: CT HEAD WITHOUT CONTRAST 08/21/2024 03:19:00 PM TECHNIQUE: CT of the head was performed without the administration of intravenous contrast. Automated exposure control, iterative reconstruction, and/or weight based adjustment of the mA/kV was utilized to reduce the radiation dose to as low as reasonably achievable. COMPARISON: None available. CLINICAL HISTORY: Headache, MSSA bacteremia FINDINGS: BRAIN AND VENTRICLES: No acute hemorrhage. No evidence of acute infarct. No hydrocephalus. No extra-axial collection. No mass effect or midline shift. Atherosclerotic calcifications of internal carotid and vertebral arteries at the skull base. ORBITS: No acute abnormality. SINUSES: Mucous retention cyst in right maxillary sinus. Mild mucosal thickening in frontal sinus and ethmoid air cells. SOFT TISSUES AND SKULL: No acute soft tissue abnormality. No skull fracture. IMPRESSION: 1. No acute intracranial abnormality. 2. Paranasal sinus disease with right maxillary mucous retention cyst and mild frontal and ethmoid mucosal thickening. Electronically signed by: Franky Stanford MD MD 08/21/2024 08:24 PM EST RP Workstation: [id number]   US  RENAL Result Date: 08/21/2024 EXAM: RETROPERITONEAL ULTRASOUND OF THE KIDNEYS 08/21/2024 09:37:44 AM TECHNIQUE: Real-time ultrasonography of the retroperitoneum, specifically the kidneys and urinary bladder, was performed. COMPARISON: US  Renal 05/14/2023. CLINICAL HISTORY: AKI (acute kidney injury). FINDINGS: RIGHT KIDNEY: Right kidney measures 13.0 x 6.0 x 5.0 cm. Normal cortical  echogenicity. No hydronephrosis. No calculus. Right renal cysts measuring up to 3.2 x 3.2 x 3.2 cm. Trace perirenal free fluid. LEFT KIDNEY: Left kidney measures 15.0 x 6.0 x 4.0 cm. Normal cortical echogenicity. No hydronephrosis. Likely nonobstructing stone in the left kidney. Left-sided renal cysts measuring up to 4.2 x 4.1 x 2.3 cm. BLADDER: Pre-void Bladder volume is 189 cc. Post-void Bladder volume is 165 cc. Layering debris in the bladder. GALLBLADDER: Sludge in the partially visualized gallbladder. IMPRESSION: 1. Likely nonobstructing stone in the left kidney. No hydronephrosis. 2. Mild diffuse bladder wall thickening and persistent postvoid residual, suggestive of possible bladder outlet obstruction. Electronically signed by: Michaeline Blanch MD 08/21/2024 04:32 PM EST RP Workstation: [id number]    Scheduled Meds:  acetaminophen   650 mg Oral TID   Or   acetaminophen   650 mg Rectal TID   antiseptic oral rinse  15 mL Topical BID   Gerhardt's butt cream   Topical TID   sodium chloride  flush  3 mL Intravenous Q12H   Continuous Infusions:  morphine  1 mg/hr (08/22/24 1346)     LOS: 4 days   Norval Bar, MD  [facility]  08/22/2024, 2:35 PM   "

## 2024-08-22 NOTE — Progress Notes (Signed)
 " [facility]  ROUNDING NOTE   Subjective:   [name] is a 79 year old male with past medical conditions including hypertension, CAD status post CABG, type 2 diabetes, hypertension, severe pulmonary hypertension, and chronic kidney disease stage IIIb.  Patient presents to the emergency department with complaints of lower extremity edema and has been admitted for Failure to thrive in adult [R62.7] Fall in home, initial encounter [W19.XXXA, Y92.009] 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] Acute on chronic congestive heart failure, unspecified heart failure type [facility]) [I50.9]  Patient is known to our practice and is followed outpatient by Dr.Korrapati.    Patient laying in bed Son at bedside, does not want patient disturbed. On 5L Gem, purewick. No acute distress noted.    Objective:  Vital signs in last 24 hours:  Temp:  [97.7 F (36.5 C)-97.8 F (36.6 C)] 97.7 F (36.5 C) (01/10 0810) Pulse Rate:  [72-83] 73 (01/10 0810) Resp:  [20] 20 (01/10 0441) BP: (111-186)/(63-79) 134/63 (01/10 0810) SpO2:  [95 %-100 %] 100 % (01/10 0810) Weight:  [89.8 kg] 89.8 kg (01/10 0500)  Weight change: -4 kg Filed Weights   08/20/24 0500 08/21/24 0500 08/22/24 0500  Weight: 85.6 kg 93.8 kg 89.8 kg    Intake/Output: I/O last 3 completed shifts: In: 220 [Other:120; IV Piggyback:100] Out: 700 [Urine:700]   Intake/Output this shift:  No intake/output data recorded.  Physical Exam: General: NAD  Head: Normocephalic  Eyes: Anicteric  Lungs:  Wheeze throughout, 5LNC O2  Heart: Regular rate and rhythm  Abdomen:  Soft, nontender  Extremities: 2+ peripheral edema.  Neurologic: Resting eyes closed  Skin: Warm,dry, no rash       Basic Metabolic Panel: Recent Labs  Lab 08/18/24 1641 08/19/24 0425 08/20/24 0420 08/21/24 0448 08/22/24 0552  NA 136 136 135 133* 134*  K 4.9 4.8 4.8 4.4 4.5  CL 94* 94* 93* 94* 95*  CO2 30 32 30 30 31   GLUCOSE 203* 211* 206* 207*  180*  BUN 69* 76* 88* 95* 93*  CREATININE 2.53* 2.66* 3.05* 3.07* 2.85*  CALCIUM  11.2* 10.4* 9.9 9.1 9.6  MG 2.0  --   --   --   --     Liver Function Tests: Recent Labs  Lab 08/18/24 1641 08/19/24 0425 08/22/24 0552  AST 31 23 16   ALT 18 15 <5  ALKPHOS 134* 115 112  BILITOT 2.5* 1.9* 1.3*  PROT 7.4 6.6 6.4*  ALBUMIN 3.4* 3.0* 2.7*   No results for input(s): LIPASE, AMYLASE in the last 168 hours. No results for input(s): AMMONIA in the last 168 hours.  CBC: Recent Labs  Lab 08/18/24 1641 08/19/24 0425 08/20/24 0420 08/21/24 0448 08/22/24 0552  WBC 16.9* 14.8* 11.2* 8.8 9.9  NEUTROABS 15.7*  --   --   --   --   HGB 8.6* 8.5* 7.2* 7.5* 8.5*  HCT 27.4* 27.1* 22.9* 23.0* 25.8*  MCV 100.0 100.4* 98.7 95.4 94.5  PLT 86* 71* 78* 78* 100*    Cardiac Enzymes: No results for input(s): CKTOTAL, CKMB, CKMBINDEX, TROPONINI in the last 168 hours.  BNP: Invalid input(s): POCBNP  CBG: Recent Labs  Lab 08/21/24 0734 08/21/24 1201 08/21/24 1627 08/21/24 2020 08/22/24 0809  GLUCAP 179* 131* 203* 167* 170*    Microbiology: Results for orders placed or performed during the hospital encounter of 08/18/24  Resp panel by RT-PCR (RSV, Flu A&B, Covid) Anterior Nasal Swab     Status: None   Collection Time: 08/18/24  4:41 PM   Specimen: Anterior Nasal Swab  Result Value Ref Range Status   SARS Coronavirus 2 by RT PCR NEGATIVE NEGATIVE Final    Comment: (NOTE) SARS-CoV-2 target nucleic acids are NOT DETECTED.  The SARS-CoV-2 RNA is generally detectable in upper respiratory specimens during the acute phase of infection. The lowest concentration of SARS-CoV-2 viral copies this assay can detect is 138 copies/mL. A negative result does not preclude SARS-Cov-2 infection and should not be used as the sole basis for treatment or other patient management decisions. A negative result may occur with  improper specimen collection/handling, submission of specimen  other than nasopharyngeal swab, presence of viral mutation(s) within the areas targeted by this assay, and inadequate number of viral copies(<138 copies/mL). A negative result must be combined with clinical observations, patient history, and epidemiological information. The expected result is Negative.  Fact Sheet for Patients:  bloggercourse.com  Fact Sheet for Healthcare Providers:  seriousbroker.it  This test is no t yet approved or cleared by the [place]  FDA and  has been authorized for detection and/or diagnosis of SARS-CoV-2 by FDA under an Emergency Use Authorization (EUA). This EUA will remain  in effect (meaning this test can be used) for the duration of the COVID-19 declaration under Section 564(b)(1) of the Act, 21 U.S.C.section 360bbb-3(b)(1), unless the authorization is terminated  or revoked sooner.       Influenza A by PCR NEGATIVE NEGATIVE Final   Influenza B by PCR NEGATIVE NEGATIVE Final    Comment: (NOTE) The Xpert Xpress SARS-CoV-2/FLU/RSV plus assay is intended as an aid in the diagnosis of influenza from Nasopharyngeal swab specimens and should not be used as a sole basis for treatment. Nasal washings and aspirates are unacceptable for Xpert Xpress SARS-CoV-2/FLU/RSV testing.  Fact Sheet for Patients: bloggercourse.com  Fact Sheet for Healthcare Providers: seriousbroker.it  This test is not yet approved or cleared by the [place]  FDA and has been authorized for detection and/or diagnosis of SARS-CoV-2 by FDA under an Emergency Use Authorization (EUA). This EUA will remain in effect (meaning this test can be used) for the duration of the COVID-19 declaration under Section 564(b)(1) of the Act, 21 U.S.C. section 360bbb-3(b)(1), unless the authorization is terminated or revoked.     Resp Syncytial Virus by PCR NEGATIVE NEGATIVE Final     Comment: (NOTE) Fact Sheet for Patients: bloggercourse.com  Fact Sheet for Healthcare Providers: seriousbroker.it  This test is not yet approved or cleared by the [place]  FDA and has been authorized for detection and/or diagnosis of SARS-CoV-2 by FDA under an Emergency Use Authorization (EUA). This EUA will remain in effect (meaning this test can be used) for the duration of the COVID-19 declaration under Section 564(b)(1) of the Act, 21 U.S.C. section 360bbb-3(b)(1), unless the authorization is terminated or revoked.  Performed at [facility], [address] [place], [place] [address]   Culture, blood (Routine X 2) w Reflex to ID Panel     Status: Abnormal   Collection Time: 08/18/24 10:33 PM   Specimen: BLOOD  Result Value Ref Range Status   Specimen Description   Final    BLOOD RIGHT ANTECUBITAL Performed at [facility], [address], [place], [place] [address]    Special Requests   Final    BOTTLES DRAWN AEROBIC AND ANAEROBIC Blood Culture adequate volume Performed at [facility], [address], [place], [place] [address]    Culture  Setup Time   Final  GRAM POSITIVE COCCI IN BOTH AEROBIC AND ANAEROBIC BOTTLES CRITICAL RESULT CALLED TO, READ BACK BY AND VERIFIED WITH: ESTILL LUTES 08/19/24 0915 MW Performed at [facility], [address], [place], [place] [address]    Culture STAPHYLOCOCCUS AUREUS (A)  Final   Report Status 08/21/2024 FINAL  Final   Organism ID, Bacteria STAPHYLOCOCCUS AUREUS  Final      Susceptibility   Staphylococcus aureus - MIC*    CIPROFLOXACIN <=0.5 SENSITIVE Sensitive     ERYTHROMYCIN RESISTANT Resistant     GENTAMICIN <=0.5 SENSITIVE Sensitive     OXACILLIN <=0.25 SENSITIVE Sensitive     TETRACYCLINE >=16 RESISTANT Resistant     VANCOMYCIN 1 SENSITIVE Sensitive     TRIMETH/SULFA <=10 SENSITIVE Sensitive     CLINDAMYCIN RESISTANT Resistant      RIFAMPIN <=0.5 SENSITIVE Sensitive     Inducible Clindamycin POSITIVE Resistant     LINEZOLID 2 SENSITIVE Sensitive     * STAPHYLOCOCCUS AUREUS  Culture, blood (Routine X 2) w Reflex to ID Panel     Status: None (Preliminary result)   Collection Time: 08/18/24 10:33 PM   Specimen: BLOOD  Result Value Ref Range Status   Specimen Description BLOOD BLOOD RIGHT ARM  Final   Special Requests   Final    BOTTLES DRAWN AEROBIC AND ANAEROBIC Blood Culture adequate volume   Culture   Final    NO GROWTH 3 DAYS Performed at [facility], [address] [place], [place] [address]    Report Status PENDING  Incomplete  Blood Culture ID Panel (Reflexed)     Status: Abnormal   Collection Time: 08/18/24 10:33 PM  Result Value Ref Range Status   Enterococcus faecalis NOT DETECTED NOT DETECTED Final   Enterococcus Faecium NOT DETECTED NOT DETECTED Final   Listeria monocytogenes NOT DETECTED NOT DETECTED Final   Staphylococcus species DETECTED (A) NOT DETECTED Final    Comment: CRITICAL RESULT CALLED TO, READ BACK BY AND VERIFIED WITH: SHEEMA HALLAJI 08/19/24 0915 MW    Staphylococcus aureus (BCID) DETECTED (A) NOT DETECTED Final    Comment: CRITICAL RESULT CALLED TO, READ BACK BY AND VERIFIED WITH: SHEEMA HALLAJI 08/19/24 0915 MW    Staphylococcus epidermidis NOT DETECTED NOT DETECTED Final   Staphylococcus lugdunensis NOT DETECTED NOT DETECTED Final   Streptococcus species NOT DETECTED NOT DETECTED Final   Streptococcus agalactiae NOT DETECTED NOT DETECTED Final   Streptococcus pneumoniae NOT DETECTED NOT DETECTED Final   Streptococcus pyogenes NOT DETECTED NOT DETECTED Final   A.calcoaceticus-baumannii NOT DETECTED NOT DETECTED Final   Bacteroides fragilis NOT DETECTED NOT DETECTED Final   Enterobacterales NOT DETECTED NOT DETECTED Final   Enterobacter cloacae complex NOT DETECTED NOT DETECTED Final   Escherichia coli NOT DETECTED NOT DETECTED Final   Klebsiella aerogenes NOT  DETECTED NOT DETECTED Final   Klebsiella oxytoca NOT DETECTED NOT DETECTED Final   Klebsiella pneumoniae NOT DETECTED NOT DETECTED Final   Proteus species NOT DETECTED NOT DETECTED Final   Salmonella species NOT DETECTED NOT DETECTED Final   Serratia marcescens NOT DETECTED NOT DETECTED Final   Haemophilus influenzae NOT DETECTED NOT DETECTED Final   Neisseria meningitidis NOT DETECTED NOT DETECTED Final   Pseudomonas aeruginosa NOT DETECTED NOT DETECTED Final   Stenotrophomonas maltophilia NOT DETECTED NOT DETECTED Final   Candida albicans NOT DETECTED NOT DETECTED Final   Candida auris NOT DETECTED NOT DETECTED Final   Candida glabrata NOT DETECTED NOT DETECTED Final   Candida krusei NOT DETECTED NOT  DETECTED Final   Candida parapsilosis NOT DETECTED NOT DETECTED Final   Candida tropicalis NOT DETECTED NOT DETECTED Final   Cryptococcus neoformans/gattii NOT DETECTED NOT DETECTED Final   Meth resistant mecA/C and MREJ NOT DETECTED NOT DETECTED Final    Comment: Performed at [facility], [address] [place], [place] [address]  Culture, blood (Routine X 2) w Reflex to ID Panel     Status: None (Preliminary result)   Collection Time: 08/19/24 11:34 AM   Specimen: BLOOD  Result Value Ref Range Status   Specimen Description BLOOD BLOOD RIGHT WRIST  Final   Special Requests   Final    BOTTLES DRAWN AEROBIC AND ANAEROBIC Blood Culture adequate volume   Culture   Final    NO GROWTH 3 DAYS Performed at [facility], [address], [place], [place] [address]    Report Status PENDING  Incomplete  Culture, blood (Routine X 2) w Reflex to ID Panel     Status: None (Preliminary result)   Collection Time: 08/21/24  4:48 AM   Specimen: BLOOD  Result Value Ref Range Status   Specimen Description BLOOD BLOOD RIGHT WRIST  Final   Special Requests   Final    BOTTLES DRAWN AEROBIC AND ANAEROBIC Blood Culture adequate volume   Culture   Final    NO GROWTH 1 DAY Performed  at [facility], [address], [place], [place] [address]    Report Status PENDING  Incomplete  Culture, blood (Routine X 2) w Reflex to ID Panel     Status: None (Preliminary result)   Collection Time: 08/21/24  4:48 AM   Specimen: BLOOD  Result Value Ref Range Status   Specimen Description BLOOD BLOOD RIGHT HAND  Final   Special Requests   Final    BOTTLES DRAWN AEROBIC AND ANAEROBIC Blood Culture adequate volume   Culture   Final    NO GROWTH 1 DAY Performed at [facility], [address] [place], [place] [address]    Report Status PENDING  Incomplete    Coagulation Studies: No results for input(s): LABPROT, INR in the last 72 hours.  Urinalysis: No results for input(s): COLORURINE, LABSPEC, PHURINE, GLUCOSEU, HGBUR, BILIRUBINUR, KETONESUR, PROTEINUR, UROBILINOGEN, NITRITE, LEUKOCYTESUR in the last 72 hours.  Invalid input(s): APPERANCEUR    Imaging: CT SOFT TISSUE NECK WO CONTRAST Result Date: 08/21/2024 EXAM: CT NECK WITHOUT CONTRAST 08/21/2024 03:19:00 PM TECHNIQUE: CT of the neck was performed without the administration of intravenous contrast. Multiplanar reformatted images are provided for review. Automated exposure control, iterative reconstruction, and/or weight based adjustment of the mA/kV was utilized to reduce the radiation dose to as low as reasonably achievable. COMPARISON: CT chest abdomen and pelvis dated 07/20/2024. CLINICAL HISTORY: Soft tissue infection suspected, neck, no prior imaging. FINDINGS: AERODIGESTIVE TRACT: No discrete mass. No edema. SALIVARY GLANDS: The parotid and submandibular glands are unremarkable. THYROID : Hypodense right thyroid  nodule measuring 1.9 cm. LYMPH NODES: No suspicious cervical lymphadenopathy. SOFT TISSUES: No mass or fluid collection. BONES: No abnormality. OTHER: Visualized sinuses and mastoid air cells are well aerated. Pleural thickening and loculated pleural fluid at the left lung  apex worsened from 07/20/2024 CT chest abdomen and pelvis. Calcific aortic atherosclerosis. IMPRESSION: 1. No acute findings. 2. Interval worsening of pleural thickening and loculated pleural fluid at the left lung apex. 3. Hypodense right thyroid  nodule measuring 1.9 cm, for which non-emergent thyroid  ultrasound is recommended. Electronically signed by: Franky Stanford MD MD 08/21/2024 08:30 PM EST  RP Workstation: [id number]   CT HEAD WO CONTRAST ( ) Result Date: 08/21/2024 EXAM: CT HEAD WITHOUT CONTRAST 08/21/2024 03:19:00 PM TECHNIQUE: CT of the head was performed without the administration of intravenous contrast. Automated exposure control, iterative reconstruction, and/or weight based adjustment of the mA/kV was utilized to reduce the radiation dose to as low as reasonably achievable. COMPARISON: None available. CLINICAL HISTORY: Headache, MSSA bacteremia FINDINGS: BRAIN AND VENTRICLES: No acute hemorrhage. No evidence of acute infarct. No hydrocephalus. No extra-axial collection. No mass effect or midline shift. Atherosclerotic calcifications of internal carotid and vertebral arteries at the skull base. ORBITS: No acute abnormality. SINUSES: Mucous retention cyst in right maxillary sinus. Mild mucosal thickening in frontal sinus and ethmoid air cells. SOFT TISSUES AND SKULL: No acute soft tissue abnormality. No skull fracture. IMPRESSION: 1. No acute intracranial abnormality. 2. Paranasal sinus disease with right maxillary mucous retention cyst and mild frontal and ethmoid mucosal thickening. Electronically signed by: Franky Stanford MD MD 08/21/2024 08:24 PM EST RP Workstation: [id number]   US  RENAL Result Date: 08/21/2024 EXAM: RETROPERITONEAL ULTRASOUND OF THE KIDNEYS 08/21/2024 09:37:44 AM TECHNIQUE: Real-time ultrasonography of the retroperitoneum, specifically the kidneys and urinary bladder, was performed. COMPARISON: US  Renal 05/14/2023. CLINICAL HISTORY: AKI (acute kidney injury). FINDINGS: RIGHT  KIDNEY: Right kidney measures 13.0 x 6.0 x 5.0 cm. Normal cortical echogenicity. No hydronephrosis. No calculus. Right renal cysts measuring up to 3.2 x 3.2 x 3.2 cm. Trace perirenal free fluid. LEFT KIDNEY: Left kidney measures 15.0 x 6.0 x 4.0 cm. Normal cortical echogenicity. No hydronephrosis. Likely nonobstructing stone in the left kidney. Left-sided renal cysts measuring up to 4.2 x 4.1 x 2.3 cm. BLADDER: Pre-void Bladder volume is 189 cc. Post-void Bladder volume is 165 cc. Layering debris in the bladder. GALLBLADDER: Sludge in the partially visualized gallbladder. IMPRESSION: 1. Likely nonobstructing stone in the left kidney. No hydronephrosis. 2. Mild diffuse bladder wall thickening and persistent postvoid residual, suggestive of possible bladder outlet obstruction. Electronically signed by: Michaeline Blanch MD 08/21/2024 04:32 PM EST RP Workstation: [id number]      Medications:    morphine  1 mg/hr (08/22/24 1346)    acetaminophen   650 mg Oral TID   Or   acetaminophen   650 mg Rectal TID   antiseptic oral rinse  15 mL Topical BID   Gerhardt's butt cream   Topical TID   sodium chloride  flush  3 mL Intravenous Q12H   acetaminophen , artificial tears, diphenhydrAMINE , glycopyrrolate  **OR** glycopyrrolate  **OR** glycopyrrolate , haloperidol  **OR** haloperidol  **OR** haloperidol  lactate, LORazepam , morphine , ondansetron  **OR** ondansetron  (ZOFRAN ) IV  Assessment/ Plan:  [name] is a 79 y.o.  male with past medical conditions including hypertension, CAD status post CABG, type 2 diabetes, hypertension, severe pulmonary hypertension, and chronic kidney disease stage IIIb.  Patient presents to the emergency department with complaints of lower extremity edema and has been admitted for Failure to thrive in adult [R62.7] Fall in home, initial encounter [W19.XXXA, Y92.009] 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] Acute on chronic congestive heart failure, unspecified heart failure type  (HCC) [I50.9]   Acute Kidney Injury on chronic kidney disease stage 3B with baseline creatinine 2.1 to and GFR of 32 on April 29, 2024. Renal US  negative for obstruction. Acute kidney injury secondary to aggressive diuresis.  Creatinine improved. IVF has been given this admission. Monitor renal indices.   Lab Results  Component Value Date   CREATININE 2.85 (H) 08/22/2024   CREATININE 3.07 (H) 08/21/2024   CREATININE 3.05 (H) 08/20/2024  Intake/Output Summary (Last 24 hours) at 08/22/2024 1658 Last data filed at 08/21/2024 2300 Gross per 24 hour  Intake --  Output 500 ml  Net -500 ml   2. Anemia of chronic kidney disease Lab Results  Component Value Date   HGB 8.5 (L) 08/22/2024    Hgb 9.5 improved.Will defer need for blood transfusion to primary team  3. Secondary Hyperparathyroidism: with outpatient labs: PTH 108, phosphorus 2.9, calcium  9.7 on 04/29/24.   Lab Results  Component Value Date   PTH 61 09/03/2020   PTH Comment 09/03/2020   CALCIUM  9.6 08/22/2024   CAION 1.37 07/01/2024   PHOS 3.3 08/14/2024    Continue to monitor bone minerals during this admission.   4. Diabetes mellitus type II with chronic kidney disease/renal manifestations: noninsulin dependent. Home regimen includes Jardiance . Most recent hemoglobin A1c is 4.6 on 03/03/24.     LOS: 4 Vince Ainsley SHAUNNA Dines 1/10/20264:58 PM   "

## 2024-08-22 NOTE — Progress Notes (Signed)
 [facility]) HOSPITAL LIAISON NOTE  Received request from Jackquline Shove, Case Manager (CM), for evaluation for Hospice InPatient Unit [place]).  Spoke with patient's Daughter in [place], [place]- at the bedside to initiate education related to hospice philosophy, services, team approach to care and criteria for IPU.   Christi verbalized understanding of information given. She states Curtistine, her  husband is very clear on wanting his father to go to [facility] for comfort care.    Patient was approved for GIP LOC for symptom management of pain and SOB.  Bed offer accepted by family.  Unable to transport until tomorrow.  Family aware and in agreement.  [facility] RN to call report to the Hospice Home at [phone]  This RN will arrange [facility] transportation when the hospital team is ready for discharge.  Please medicate the patient prior to EMS transport as needed for comfort during transport.  Please send signed and completed DNR home with patient/family if applicable.  [facility] information and contact numbers given to [employer]  Above information shared with Alana Henry, CM and hospital medical care team.  Please call with any hospice related questions or concerns.  Thank you for the opportunity to participate in this patient's care.  Saddie HILARIO Na, MA, BSN, RN, FNE Nurse Liaison [phone]

## 2024-08-22 NOTE — Progress Notes (Signed)
 [facility] Cardiology    SUBJECTIVE: Lying in bed not responding not arousable frail-appearing appears to be in mild respiratory distress patient seeking comfort measures only.  Being treated for pain management and comfort   Vitals:   08/21/24 2350 08/22/24 0441 08/22/24 0500 08/22/24 0810  BP: 117/63 137/79  134/63  Pulse: 83 73  73  Resp: 20 20    Temp: 97.8 F (36.6 C) 97.8 F (36.6 C)  97.7 F (36.5 C)  TempSrc:    Axillary  SpO2: 97% 99%  100%  Weight:   89.8 kg   Height:         Intake/Output Summary (Last 24 hours) at 08/22/2024 1438 Last data filed at 08/21/2024 2300 Gross per 24 hour  Intake --  Output 500 ml  Net -500 ml      PHYSICAL EXAM  General: Well developed, well nourished, in no acute distress HEENT:  Normocephalic and atramatic Neck:  No JVD.  Lungs: Clear bilaterally to auscultation and percussion. Heart: HRRR . Normal S1 and S2 without gallops or murmurs.  Abdomen: Bowel sounds are positive, abdomen soft and non-tender  Msk:  Back normal, normal gait. Normal strength and tone for age. Extremities: No clubbing, cyanosis or edema.   Neuro: Alert and oriented X 3. Psych:  Good affect, responds appropriately   LABS: Basic Metabolic Panel: Recent Labs    08/21/24 0448 08/22/24 0552  NA 133* 134*  K 4.4 4.5  CL 94* 95*  CO2 30 31  GLUCOSE 207* 180*  BUN 95* 93*  CREATININE 3.07* 2.85*  CALCIUM  9.1 9.6   Liver Function Tests: Recent Labs    08/22/24 0552  AST 16  ALT <5  ALKPHOS 112  BILITOT 1.3*  PROT 6.4*  ALBUMIN 2.7*   No results for input(s): LIPASE, AMYLASE in the last 72 hours. CBC: Recent Labs    08/21/24 0448 08/22/24 0552  WBC 8.8 9.9  HGB 7.5* 8.5*  HCT 23.0* 25.8*  MCV 95.4 94.5  PLT 78* 100*   Cardiac Enzymes: No results for input(s): CKTOTAL, CKMB, CKMBINDEX, TROPONINI in the last 72 hours. BNP: Invalid input(s): POCBNP D-Dimer: No results for input(s): DDIMER in the last 72 hours. Hemoglobin  A1C: No results for input(s): HGBA1C in the last 72 hours. Fasting Lipid Panel: No results for input(s): CHOL, HDL, LDLCALC, TRIG, CHOLHDL, LDLDIRECT in the last 72 hours. Thyroid  Function Tests: No results for input(s): TSH, T4TOTAL, T3FREE, THYROIDAB in the last 72 hours.  Invalid input(s): FREET3 Anemia Panel: No results for input(s): VITAMINB12, FOLATE, FERRITIN, TIBC, IRON , RETICCTPCT in the last 72 hours.  CT SOFT TISSUE NECK WO CONTRAST Result Date: 08/21/2024 EXAM: CT NECK WITHOUT CONTRAST 08/21/2024 03:19:00 PM TECHNIQUE: CT of the neck was performed without the administration of intravenous contrast. Multiplanar reformatted images are provided for review. Automated exposure control, iterative reconstruction, and/or weight based adjustment of the mA/kV was utilized to reduce the radiation dose to as low as reasonably achievable. COMPARISON: CT chest abdomen and pelvis dated 07/20/2024. CLINICAL HISTORY: Soft tissue infection suspected, neck, no prior imaging. FINDINGS: AERODIGESTIVE TRACT: No discrete mass. No edema. SALIVARY GLANDS: The parotid and submandibular glands are unremarkable. THYROID : Hypodense right thyroid  nodule measuring 1.9 cm. LYMPH NODES: No suspicious cervical lymphadenopathy. SOFT TISSUES: No mass or fluid collection. BONES: No abnormality. OTHER: Visualized sinuses and mastoid air cells are well aerated. Pleural thickening and loculated pleural fluid at the left lung apex worsened from 07/20/2024 CT chest abdomen and pelvis. Calcific aortic atherosclerosis.  IMPRESSION: 1. No acute findings. 2. Interval worsening of pleural thickening and loculated pleural fluid at the left lung apex. 3. Hypodense right thyroid  nodule measuring 1.9 cm, for which non-emergent thyroid  ultrasound is recommended. Electronically signed by: Franky Stanford MD MD 08/21/2024 08:30 PM EST RP Workstation: [id number]   CT HEAD WO CONTRAST ( ) Result Date:  08/21/2024 EXAM: CT HEAD WITHOUT CONTRAST 08/21/2024 03:19:00 PM TECHNIQUE: CT of the head was performed without the administration of intravenous contrast. Automated exposure control, iterative reconstruction, and/or weight based adjustment of the mA/kV was utilized to reduce the radiation dose to as low as reasonably achievable. COMPARISON: None available. CLINICAL HISTORY: Headache, MSSA bacteremia FINDINGS: BRAIN AND VENTRICLES: No acute hemorrhage. No evidence of acute infarct. No hydrocephalus. No extra-axial collection. No mass effect or midline shift. Atherosclerotic calcifications of internal carotid and vertebral arteries at the skull base. ORBITS: No acute abnormality. SINUSES: Mucous retention cyst in right maxillary sinus. Mild mucosal thickening in frontal sinus and ethmoid air cells. SOFT TISSUES AND SKULL: No acute soft tissue abnormality. No skull fracture. IMPRESSION: 1. No acute intracranial abnormality. 2. Paranasal sinus disease with right maxillary mucous retention cyst and mild frontal and ethmoid mucosal thickening. Electronically signed by: Franky Stanford MD MD 08/21/2024 08:24 PM EST RP Workstation: [id number]   US  RENAL Result Date: 08/21/2024 EXAM: RETROPERITONEAL ULTRASOUND OF THE KIDNEYS 08/21/2024 09:37:44 AM TECHNIQUE: Real-time ultrasonography of the retroperitoneum, specifically the kidneys and urinary bladder, was performed. COMPARISON: US  Renal 05/14/2023. CLINICAL HISTORY: AKI (acute kidney injury). FINDINGS: RIGHT KIDNEY: Right kidney measures 13.0 x 6.0 x 5.0 cm. Normal cortical echogenicity. No hydronephrosis. No calculus. Right renal cysts measuring up to 3.2 x 3.2 x 3.2 cm. Trace perirenal free fluid. LEFT KIDNEY: Left kidney measures 15.0 x 6.0 x 4.0 cm. Normal cortical echogenicity. No hydronephrosis. Likely nonobstructing stone in the left kidney. Left-sided renal cysts measuring up to 4.2 x 4.1 x 2.3 cm. BLADDER: Pre-void Bladder volume is 189 cc. Post-void Bladder volume  is 165 cc. Layering debris in the bladder. GALLBLADDER: Sludge in the partially visualized gallbladder. IMPRESSION: 1. Likely nonobstructing stone in the left kidney. No hydronephrosis. 2. Mild diffuse bladder wall thickening and persistent postvoid residual, suggestive of possible bladder outlet obstruction. Electronically signed by: Michaeline Blanch MD 08/21/2024 04:32 PM EST RP Workstation: [id number]     Echo   TELEMETRY: Atrial fibrillation right bundle branch block nonspecific ST-T wave changes rate of 90:  ASSESSMENT AND PLAN:  Principal Problem:   Failure to thrive in adult Active Problems:   T2DM (type 2 diabetes mellitus) (HCC)   Essential hypertension   OSA (obstructive sleep apnea)   CAD (coronary artery disease), s/p CABG x 3   Hyperlipidemia associated with type 2 diabetes mellitus (HCC)   Paroxysmal atrial fibrillation (HCC)   Acute kidney injury superimposed on stage 3b chronic kidney disease (HCC)   Moderate mitral regurgitation   Anemia in chronic kidney disease   Acute heart failure with preserved ejection fraction (HCC)   Bacteremia due to methicillin susceptible Staphylococcus aureus (MSSA)   History of transcatheter aortic valve replacement (TAVR)    Plan Acute hypoxic respiratory failure continue supplemental oxygen BiPAP CPAP if able to tolerate Acute on chronic HFpEF with limited improvement in symptoms with diuresis Acute on chronic renal insufficiency continue management hydration as per nephrology MSSA bacteremia on broad-spectrum antibiotic therapy do not recommend TEE in this patient current condition History of TAVR concern for bacteremia possible endocarditis patient unlikely to benefit from TEE because  further intervention would be unlikely continue antibiotic therapy Known multivessel coronary disease coronary bypass surgery multivessel PCI and stent continue current management Obstructive sleep apnea hopefully patient can tolerate CPAP or  BiPAP Recommend consider palliative care as patient prognosis at this stage is poor Case discussed with son and family who are pursuing palliative care   Jerry JONETTA Lovelace, MD,  08/22/2024 2:38 PM

## 2024-08-22 NOTE — TOC Progression Note (Signed)
 Transition of Care [facility]) - Progression Note    Patient Details  Name: [name] MRN: [id number] Date of Birth: 1946/05/27  Transition of Care [facility]) CM/SW Contact  Victory Jackquline RAMAN, RN Phone Number: 08/22/2024, 2:41 PM  Clinical Narrative:    RNCM received as message via secure chat from the MD informing me that the patient is going to be evaluating Patient for Hospice Home by [facility]. RNCM will continue to follow for discharge planning needs.     Expected Discharge Plan: Skilled Nursing Facility Barriers to Discharge: Continued Medical Work up                Expected Discharge Plan and Services       Living arrangements for the past 2 months: Mobile Home                                       Social Drivers of Health (SDOH) Interventions SDOH Screenings   Food Insecurity: No Food Insecurity (08/20/2024)  Housing: Low Risk (08/20/2024)  Transportation Needs: No Transportation Needs (08/20/2024)  Utilities: Not At Risk (08/20/2024)  Depression (PHQ2-9): Low Risk (07/16/2024)  Financial Resource Strain: Low Risk  (05/08/2024)   Received from [facility]  Social Connections: Unknown (08/20/2024)  Recent Concern: Social Connections - Moderately Isolated (07/18/2024)  Tobacco Use: Medium Risk (08/10/2024)    Readmission Risk Interventions    08/20/2024    3:06 PM 08/12/2024    1:40 PM 07/20/2024   12:29 PM  Readmission Risk Prevention Plan  Transportation Screening Complete Complete Complete  PCP or Specialist Appt within 3-5 Days   --  HRI or Home Care Consult   Complete  Social Work Consult for Recovery Care Planning/Counseling   Complete  Medication Review Oceanographer) Complete Complete Complete  PCP or Specialist appointment within 3-5 days of discharge Complete Complete   HRI or Home Care Consult Complete Complete   SW Recovery Care/Counseling Consult  Complete   Palliative Care Screening Not Applicable Not Applicable   Skilled  Nursing Facility Complete Complete

## 2024-08-22 NOTE — Plan of Care (Signed)
  Problem: Education: Goal: Ability to describe self-care measures that may prevent or decrease complications (Diabetes Survival Skills Education) will improve Outcome: Not Progressing Goal: Individualized Educational Video(s) Outcome: Not Progressing   Problem: Coping: Goal: Ability to adjust to condition or change in health will improve Outcome: Not Progressing   Problem: Fluid Volume: Goal: Ability to maintain a balanced intake and output will improve Outcome: Not Progressing   Problem: Health Behavior/Discharge Planning: Goal: Ability to identify and utilize available resources and services will improve Outcome: Not Progressing Goal: Ability to manage health-related needs will improve Outcome: Not Progressing   Problem: Metabolic: Goal: Ability to maintain appropriate glucose levels will improve Outcome: Not Progressing   Problem: Nutritional: Goal: Maintenance of adequate nutrition will improve Outcome: Not Progressing Goal: Progress toward achieving an optimal weight will improve Outcome: Not Progressing   Problem: Skin Integrity: Goal: Risk for impaired skin integrity will decrease Outcome: Not Progressing   Problem: Tissue Perfusion: Goal: Adequacy of tissue perfusion will improve Outcome: Not Progressing   Problem: Education: Goal: Knowledge of General Education information will improve Description: Including pain rating scale, medication(s)/side effects and non-pharmacologic comfort measures Outcome: Not Progressing   Problem: Health Behavior/Discharge Planning: Goal: Ability to manage health-related needs will improve Outcome: Not Progressing   Problem: Clinical Measurements: Goal: Ability to maintain clinical measurements within normal limits will improve Outcome: Not Progressing Goal: Will remain free from infection Outcome: Not Progressing Goal: Diagnostic test results will improve Outcome: Not Progressing Goal: Respiratory complications will  improve Outcome: Not Progressing Goal: Cardiovascular complication will be avoided Outcome: Not Progressing   Problem: Activity: Goal: Risk for activity intolerance will decrease Outcome: Not Progressing   Problem: Nutrition: Goal: Adequate nutrition will be maintained Outcome: Not Progressing   Problem: Coping: Goal: Level of anxiety will decrease Outcome: Not Progressing   Problem: Elimination: Goal: Will not experience complications related to bowel motility Outcome: Not Progressing Goal: Will not experience complications related to urinary retention Outcome: Not Progressing   Problem: Pain Managment: Goal: General experience of comfort will improve and/or be controlled Outcome: Not Progressing   Problem: Safety: Goal: Ability to remain free from injury will improve Outcome: Not Progressing   Problem: Skin Integrity: Goal: Risk for impaired skin integrity will decrease Outcome: Not Progressing   Problem: Clinical Measurements: Goal: Ability to avoid or minimize complications of infection will improve Outcome: Not Progressing   Problem: Skin Integrity: Goal: Skin integrity will improve Outcome: Not Progressing

## 2024-08-22 NOTE — Progress Notes (Signed)
 11pm patient is c/o of excruciating shoulder and neck pain 10/10 that is not being relieved by current regimen. patient stated that he can not wait until next PRN is due. Dr. Lawence notified. See New orders.  1130pm Patient is requesting comfort care at this time.Patient stated that he is tired of being in excruciating pain. Would like to talk about Hospice. Patient refusing cares, Wound care and repositioning. Patient resting in bed at this time Son at bedside. Bed in lowest position, call light within reach. Needs met at this time. Charge RN Brittany notified. Will pass on to oncoming Day shift.

## 2024-08-22 NOTE — Progress Notes (Signed)
 Patient is refusing care at this time. Patient is resting in bed at this time. Pain is being managed by Morphine  drip. Son at bed side. Bed in lowest position. Call light within reach.Frequent checks.

## 2024-08-23 DIAGNOSIS — N179 Acute kidney failure, unspecified: Secondary | ICD-10-CM | POA: Diagnosis not present

## 2024-08-23 DIAGNOSIS — Z952 Presence of prosthetic heart valve: Secondary | ICD-10-CM | POA: Diagnosis not present

## 2024-08-23 DIAGNOSIS — I340 Nonrheumatic mitral (valve) insufficiency: Secondary | ICD-10-CM | POA: Diagnosis not present

## 2024-08-23 DIAGNOSIS — R7881 Bacteremia: Secondary | ICD-10-CM | POA: Diagnosis not present

## 2024-08-23 DIAGNOSIS — G4733 Obstructive sleep apnea (adult) (pediatric): Secondary | ICD-10-CM | POA: Diagnosis not present

## 2024-08-23 DIAGNOSIS — N1832 Chronic kidney disease, stage 3b: Secondary | ICD-10-CM | POA: Diagnosis not present

## 2024-08-23 DIAGNOSIS — I480 Paroxysmal atrial fibrillation: Secondary | ICD-10-CM | POA: Diagnosis not present

## 2024-08-23 DIAGNOSIS — R627 Adult failure to thrive: Secondary | ICD-10-CM | POA: Diagnosis not present

## 2024-08-23 DIAGNOSIS — N184 Chronic kidney disease, stage 4 (severe): Secondary | ICD-10-CM | POA: Diagnosis not present

## 2024-08-23 DIAGNOSIS — I25118 Atherosclerotic heart disease of native coronary artery with other forms of angina pectoris: Secondary | ICD-10-CM | POA: Diagnosis not present

## 2024-08-23 DIAGNOSIS — I5031 Acute diastolic (congestive) heart failure: Secondary | ICD-10-CM | POA: Diagnosis not present

## 2024-08-23 DIAGNOSIS — Z515 Encounter for palliative care: Secondary | ICD-10-CM | POA: Diagnosis not present

## 2024-08-23 NOTE — Plan of Care (Signed)
" °  Problem: Education: Goal: Ability to describe self-care measures that may prevent or decrease complications (Diabetes Survival Skills Education) will improve Outcome: Progressing Goal: Individualized Educational Video(s) Outcome: Progressing   Problem: Coping: Goal: Ability to adjust to condition or change in health will improve Outcome: Progressing   Problem: Fluid Volume: Goal: Ability to maintain a balanced intake and output will improve Outcome: Progressing   Problem: Health Behavior/Discharge Planning: Goal: Ability to identify and utilize available resources and services will improve Outcome: Progressing Goal: Ability to manage health-related needs will improve Outcome: Progressing   Problem: Metabolic: Goal: Ability to maintain appropriate glucose levels will improve Outcome: Progressing   Problem: Nutritional: Goal: Maintenance of adequate nutrition will improve Outcome: Progressing Goal: Progress toward achieving an optimal weight will improve Outcome: Progressing   Problem: Skin Integrity: Goal: Risk for impaired skin integrity will decrease Outcome: Progressing   Problem: Tissue Perfusion: Goal: Adequacy of tissue perfusion will improve Outcome: Progressing   Problem: Education: Goal: Knowledge of General Education information will improve Description: Including pain rating scale, medication(s)/side effects and non-pharmacologic comfort measures Outcome: Progressing   Problem: Health Behavior/Discharge Planning: Goal: Ability to manage health-related needs will improve Outcome: Progressing   Problem: Clinical Measurements: Goal: Ability to maintain clinical measurements within normal limits will improve Outcome: Progressing Goal: Will remain free from infection Outcome: Progressing Goal: Diagnostic test results will improve Outcome: Progressing Goal: Respiratory complications will improve Outcome: Progressing Goal: Cardiovascular complication will  be avoided Outcome: Progressing   Problem: Activity: Goal: Risk for activity intolerance will decrease Outcome: Progressing   Problem: Nutrition: Goal: Adequate nutrition will be maintained Outcome: Progressing   Problem: Coping: Goal: Level of anxiety will decrease Outcome: Progressing   Problem: Elimination: Goal: Will not experience complications related to bowel motility Outcome: Progressing Goal: Will not experience complications related to urinary retention Outcome: Progressing   Problem: Pain Managment: Goal: General experience of comfort will improve and/or be controlled Outcome: Progressing   Problem: Safety: Goal: Ability to remain free from injury will improve Outcome: Progressing   Problem: Skin Integrity: Goal: Risk for impaired skin integrity will decrease Outcome: Progressing   Problem: Clinical Measurements: Goal: Ability to avoid or minimize complications of infection will improve Outcome: Progressing   Problem: Skin Integrity: Goal: Skin integrity will improve Outcome: Progressing   Problem: Education: Goal: Knowledge of the prescribed therapeutic regimen will improve Outcome: Progressing   Problem: Coping: Goal: Ability to identify and develop effective coping behavior will improve Outcome: Progressing   Problem: Clinical Measurements: Goal: Quality of life will improve Outcome: Progressing   Problem: Respiratory: Goal: Verbalizations of increased ease of respirations will increase Outcome: Progressing   Problem: Role Relationship: Goal: Family's ability to cope with current situation will improve Outcome: Progressing Goal: Ability to verbalize concerns, feelings, and thoughts to partner or family member will improve Outcome: Progressing   Problem: Pain Management: Goal: Satisfaction with pain management regimen will improve Outcome: Progressing   "

## 2024-08-23 NOTE — TOC Transition Note (Signed)
 Transition of Care [facility]) - Discharge Note   Patient Details  Name: [name] MRN: [id number] Date of Birth: 09-Aug-1946  Transition of Care [facility]) CM/SW Contact:  Victory Jackquline RAMAN, RN Phone Number: September 04, 2024, 10:36 AM   Clinical Narrative:    RNCM received a message via secure chat from MD and Saddie with [facility] informing me that the patient has been accepted to [facility] and will be transferred today. Nurse to call report to: [phone]. Medical Necessity Form completed. Patient's spouse Rock notified of transfer. Pt has discharge orders, no further concerns. RNCM signing off.    Final next level of care: Hospice Medical Facility Barriers to Discharge: Barriers Resolved   Patient Goals and CMS Choice            Discharge Placement                Patient to be transferred to facility by: [facility] Name of family member notified: Rock Patient and family notified of of transfer: 2024/09/04  Discharge Plan and Services Additional resources added to the After Visit Summary for                                       Social Drivers of Health (SDOH) Interventions SDOH Screenings   Food Insecurity: No Food Insecurity (08/20/2024)  Housing: Low Risk (08/20/2024)  Transportation Needs: No Transportation Needs (08/20/2024)  Utilities: Not At Risk (08/20/2024)  Depression (PHQ2-9): Low Risk (07/16/2024)  Financial Resource Strain: Low Risk  (05/08/2024)   Received from [facility]  Social Connections: Unknown (08/20/2024)  Recent Concern: Social Connections - Moderately Isolated (07/18/2024)  Tobacco Use: Medium Risk (08/10/2024)     Readmission Risk Interventions    08/20/2024    3:06 PM 08/12/2024    1:40 PM 07/20/2024   12:29 PM  Readmission Risk Prevention Plan  Transportation Screening Complete Complete Complete  PCP or Specialist Appt within 3-5 Days   --  HRI or Home Care Consult   Complete  Social Work Consult for Recovery Care  Planning/Counseling   Complete  Medication Review Oceanographer) Complete Complete Complete  PCP or Specialist appointment within 3-5 days of discharge Complete Complete   HRI or Home Care Consult Complete Complete   SW Recovery Care/Counseling Consult  Complete   Palliative Care Screening Not Applicable Not Applicable   Skilled Nursing Facility Complete Complete

## 2024-08-23 NOTE — Progress Notes (Signed)
" °  Chaplain On-Call responded to a page at 1356 from [employer] Nurse Amy, who reported the death of the patient and that the patient's son is present.  The Time of Death was listed as 1213.  Chaplain met the patient's son Curtistine at bedside, and provided prayer and spiritual and emotional support.  Chaplain offered time for Curtistine to recall special events in the patient's life, and the relationships of the patient with his wife and sons. Curtistine stated his appreciation for Chaplain support.   Chaplain Bebe Ardean EMERSON Hershal., BCC "

## 2024-08-23 NOTE — Progress Notes (Signed)
 [facility] Cardiology    SUBJECTIVE: Lying in bed not responsive to questioning weak fatigue denies any pain at significant distance persistent dyspnea and shortness of breath   Vitals:   08/22/24 0810 08/22/24 2109 08/22/24 2109 Sep 05, 2024 0905  BP: 134/63 126/66 126/66 (!) 125/51  Pulse: 73 83 80 73  Resp:  18 18 18   Temp: 97.7 F (36.5 C) 98 F (36.7 C) 98 F (36.7 C) 98.7 F (37.1 C)  TempSrc: Axillary   Axillary  SpO2: 100% 90% 91% 90%  Weight:      Height:         Intake/Output Summary (Last 24 hours) at 05-Sep-2024 1454 Last data filed at 2024-09-05 1036 Gross per 24 hour  Intake 240 ml  Output 700 ml  Net -460 ml      PHYSICAL EXAM  General: Well developed, well nourished, in no acute distress HEENT:  Normocephalic and atramatic Neck:  No JVD.  Lungs: Clear bilaterally to auscultation and percussion. Heart: HRRR . Normal S1 and S2 without gallops or murmurs.  Abdomen: Bowel sounds are positive, abdomen soft and non-tender  Msk:  Back normal, normal gait. Normal strength and tone for age. Extremities: No clubbing, cyanosis or edema.   Neuro: Alert and oriented X 3. Psych:  Good affect, responds appropriately   LABS: Basic Metabolic Panel: Recent Labs    08/21/24 0448 08/22/24 0552  NA 133* 134*  K 4.4 4.5  CL 94* 95*  CO2 30 31  GLUCOSE 207* 180*  BUN 95* 93*  CREATININE 3.07* 2.85*  CALCIUM  9.1 9.6   Liver Function Tests: Recent Labs    08/22/24 0552  AST 16  ALT <5  ALKPHOS 112  BILITOT 1.3*  PROT 6.4*  ALBUMIN 2.7*   No results for input(s): LIPASE, AMYLASE in the last 72 hours. CBC: Recent Labs    08/21/24 0448 08/22/24 0552  WBC 8.8 9.9  HGB 7.5* 8.5*  HCT 23.0* 25.8*  MCV 95.4 94.5  PLT 78* 100*   Cardiac Enzymes: No results for input(s): CKTOTAL, CKMB, CKMBINDEX, TROPONINI in the last 72 hours. BNP: Invalid input(s): POCBNP D-Dimer: No results for input(s): DDIMER in the last 72 hours. Hemoglobin A1C: No  results for input(s): HGBA1C in the last 72 hours. Fasting Lipid Panel: No results for input(s): CHOL, HDL, LDLCALC, TRIG, CHOLHDL, LDLDIRECT in the last 72 hours. Thyroid  Function Tests: No results for input(s): TSH, T4TOTAL, T3FREE, THYROIDAB in the last 72 hours.  Invalid input(s): FREET3 Anemia Panel: No results for input(s): VITAMINB12, FOLATE, FERRITIN, TIBC, IRON , RETICCTPCT in the last 72 hours.  CT SOFT TISSUE NECK WO CONTRAST Result Date: 08/21/2024 EXAM: CT NECK WITHOUT CONTRAST 08/21/2024 03:19:00 PM TECHNIQUE: CT of the neck was performed without the administration of intravenous contrast. Multiplanar reformatted images are provided for review. Automated exposure control, iterative reconstruction, and/or weight based adjustment of the mA/kV was utilized to reduce the radiation dose to as low as reasonably achievable. COMPARISON: CT chest abdomen and pelvis dated 07/20/2024. CLINICAL HISTORY: Soft tissue infection suspected, neck, no prior imaging. FINDINGS: AERODIGESTIVE TRACT: No discrete mass. No edema. SALIVARY GLANDS: The parotid and submandibular glands are unremarkable. THYROID : Hypodense right thyroid  nodule measuring 1.9 cm. LYMPH NODES: No suspicious cervical lymphadenopathy. SOFT TISSUES: No mass or fluid collection. BONES: No abnormality. OTHER: Visualized sinuses and mastoid air cells are well aerated. Pleural thickening and loculated pleural fluid at the left lung apex worsened from 07/20/2024 CT chest abdomen and pelvis. Calcific aortic atherosclerosis. IMPRESSION: 1. No  acute findings. 2. Interval worsening of pleural thickening and loculated pleural fluid at the left lung apex. 3. Hypodense right thyroid  nodule measuring 1.9 cm, for which non-emergent thyroid  ultrasound is recommended. Electronically signed by: Franky Stanford MD MD 08/21/2024 08:30 PM EST RP Workstation: [id number]   CT HEAD WO CONTRAST ( ) Result Date: 08/21/2024 EXAM: CT  HEAD WITHOUT CONTRAST 08/21/2024 03:19:00 PM TECHNIQUE: CT of the head was performed without the administration of intravenous contrast. Automated exposure control, iterative reconstruction, and/or weight based adjustment of the mA/kV was utilized to reduce the radiation dose to as low as reasonably achievable. COMPARISON: None available. CLINICAL HISTORY: Headache, MSSA bacteremia FINDINGS: BRAIN AND VENTRICLES: No acute hemorrhage. No evidence of acute infarct. No hydrocephalus. No extra-axial collection. No mass effect or midline shift. Atherosclerotic calcifications of internal carotid and vertebral arteries at the skull base. ORBITS: No acute abnormality. SINUSES: Mucous retention cyst in right maxillary sinus. Mild mucosal thickening in frontal sinus and ethmoid air cells. SOFT TISSUES AND SKULL: No acute soft tissue abnormality. No skull fracture. IMPRESSION: 1. No acute intracranial abnormality. 2. Paranasal sinus disease with right maxillary mucous retention cyst and mild frontal and ethmoid mucosal thickening. Electronically signed by: Franky Stanford MD MD 08/21/2024 08:24 PM EST RP Workstation: [id number]     Echo depressed left ventricular function  TELEMETRY: Sinus rhythm nonspecific ST-T changes:  ASSESSMENT AND PLAN:  Principal Problem:   Failure to thrive in adult Active Problems:   T2DM (type 2 diabetes mellitus) (HCC)   Essential hypertension   OSA (obstructive sleep apnea)   CAD (coronary artery disease), s/p CABG x 3   Hyperlipidemia associated with type 2 diabetes mellitus (HCC)   Paroxysmal atrial fibrillation (HCC)   Acute kidney injury superimposed on stage 3b chronic kidney disease (HCC)   Moderate mitral regurgitation   Anemia in chronic kidney disease   Acute heart failure with preserved ejection fraction (HCC)   Bacteremia due to methicillin susceptible Staphylococcus aureus (MSSA)   History of transcatheter aortic valve replacement (TAVR)    Plan Acute on  chronic persistent congestive heart failure supplemental oxygen and inhalers as necessary Acute on chronic renal insufficiency maintain adequate renal perfusion Obstructive sleep apnea Hyperlipidemia management continue statin therapy for hyperlipidemia Status post TAVR for aortic stenosis now with persistent shortness of breath and dyspnea symptoms Significant mitral regurgitation not amenable to intervention Recommend conservative measures consider palliative care for goals of care conference  Cara JONETTA Lovelace, MD, 09-07-2024 2:54 PM

## 2024-08-23 NOTE — Progress Notes (Addendum)
 Report called and given to [facility]. IV kept in for inpatient hospice to right lower forearm Son has pts belongings.Son at Bedside

## 2024-08-23 NOTE — Plan of Care (Signed)
" °  Problem: Education: Goal: Ability to describe self-care measures that may prevent or decrease complications (Diabetes Survival Skills Education) will improve Outcome: Adequate for Discharge Goal: Individualized Educational Video(s) Outcome: Adequate for Discharge   Problem: Coping: Goal: Ability to adjust to condition or change in health will improve Outcome: Adequate for Discharge   Problem: Fluid Volume: Goal: Ability to maintain a balanced intake and output will improve Outcome: Adequate for Discharge   Problem: Health Behavior/Discharge Planning: Goal: Ability to identify and utilize available resources and services will improve Outcome: Adequate for Discharge Goal: Ability to manage health-related needs will improve Outcome: Adequate for Discharge   Problem: Metabolic: Goal: Ability to maintain appropriate glucose levels will improve Outcome: Adequate for Discharge   Problem: Nutritional: Goal: Maintenance of adequate nutrition will improve Outcome: Adequate for Discharge Goal: Progress toward achieving an optimal weight will improve Outcome: Adequate for Discharge   Problem: Skin Integrity: Goal: Risk for impaired skin integrity will decrease Outcome: Adequate for Discharge   Problem: Tissue Perfusion: Goal: Adequacy of tissue perfusion will improve Outcome: Adequate for Discharge   Problem: Education: Goal: Knowledge of General Education information will improve Description: Including pain rating scale, medication(s)/side effects and non-pharmacologic comfort measures Outcome: Adequate for Discharge   Problem: Health Behavior/Discharge Planning: Goal: Ability to manage health-related needs will improve Outcome: Adequate for Discharge   Problem: Clinical Measurements: Goal: Ability to maintain clinical measurements within normal limits will improve Outcome: Adequate for Discharge Goal: Will remain free from infection Outcome: Adequate for Discharge Goal:  Diagnostic test results will improve Outcome: Adequate for Discharge Goal: Respiratory complications will improve Outcome: Adequate for Discharge Goal: Cardiovascular complication will be avoided Outcome: Adequate for Discharge   Problem: Activity: Goal: Risk for activity intolerance will decrease Outcome: Adequate for Discharge   Problem: Nutrition: Goal: Adequate nutrition will be maintained Outcome: Adequate for Discharge   Problem: Coping: Goal: Level of anxiety will decrease Outcome: Adequate for Discharge   Problem: Elimination: Goal: Will not experience complications related to bowel motility Outcome: Adequate for Discharge Goal: Will not experience complications related to urinary retention Outcome: Adequate for Discharge   Problem: Pain Managment: Goal: General experience of comfort will improve and/or be controlled Outcome: Adequate for Discharge   Problem: Safety: Goal: Ability to remain free from injury will improve Outcome: Adequate for Discharge   Problem: Skin Integrity: Goal: Risk for impaired skin integrity will decrease Outcome: Adequate for Discharge   Problem: Clinical Measurements: Goal: Ability to avoid or minimize complications of infection will improve Outcome: Adequate for Discharge   Problem: Skin Integrity: Goal: Skin integrity will improve Outcome: Adequate for Discharge   Problem: Education: Goal: Knowledge of the prescribed therapeutic regimen will improve Outcome: Adequate for Discharge   Problem: Coping: Goal: Ability to identify and develop effective coping behavior will improve Outcome: Adequate for Discharge   Problem: Clinical Measurements: Goal: Quality of life will improve Outcome: Adequate for Discharge   Problem: Respiratory: Goal: Verbalizations of increased ease of respirations will increase Outcome: Adequate for Discharge   Problem: Role Relationship: Goal: Family's ability to cope with current situation will  improve Outcome: Adequate for Discharge Goal: Ability to verbalize concerns, feelings, and thoughts to partner or family member will improve Outcome: Adequate for Discharge   Problem: Pain Management: Goal: Satisfaction with pain management regimen will improve Outcome: Adequate for Discharge   "

## 2024-08-23 NOTE — Progress Notes (Signed)
 "                                                                                                                                                                                               Palliative Care Progress Note, Assessment & Plan   Patient Name: [name]       Date: 08/30/2024 DOB: [birth year]  Age: 79 y.o. MRN#: [id number] Attending Physician: Cosette Blackwater, MD Primary Care Physician: [name], MD Admit Date: 08/18/2024  Subjective: Unable to assess  HPI: 79 y.o. male  with past medical history of HTN, HLD, CAD s/p CABG, type 2 diabetes, CKD (stage IIIb), CHF (recent EF 60-65%), and severe pulmonary HTN admitted on 08/18/2024 with increasing weakness and dyspnea.   Patient has had 4 inpatient admissions and 1 ED visit in the last 6 months.  Most recent admission was 1229-08/15/2024 for acute exacerbation of heart failure.   Upon presentation to the ED, patient endorses he was weak and having difficulty with ambulation as well as pain in his hand/shoulder on his left side.  Vital signs were within normal limits and patient was hemodynamically stable.  Lab work revealed moderate leukocytosis-16.9, hemoglobin at baseline 8.6, AKI on CKD with creatinine 2.  5 3, WBC mildly elevated at 14.8.  Blood cultures revealed MSSA patient currently on cefazolin .  Radiographs of left hand revealed small foreign body in soft tissues of left middle finger anterior to the middle phalanx.   Patient is current being treated for failure to thrive, acute hypoxic respiratory failure, acute on chronic HFpEF, AKI on CKD, recurrent pleural effusions, MSSA bacteremia, chronic anemia, demand ischemia.   Patient is being followed by infectious disease, cardiology, PT/OT.   PMT was consulted to support patient and family with goals of care discussions   Following up today for pain management of severe LUE and neck pain.   Summary of counseling/coordination of care Chart reviewed:   Labs: No new labs    Vitals: Blood pressure (!) 125/51, pulse 73, temperature 98.7 F (37.1 C), temperature source Axillary, resp. rate 18, height 6' 1 (1.854 m), weight 89.8 kg, SpO2 90%.   Progress notes: Reviewed progress notes from TR[facility], Nephrology, TOC, PMT, [facility] and nursing staff  Imaging: No new imaging  MAR: Pt transitioned to morphine  gtt for comfort focused/EOL care 1/10  ACP documents: None on file. DNR comfort   After reviewing the patient's chart and assessing the patient at bedside, I spoke with patient's son in regards to symptom management and goals of care.   Ill-appearing, elderly male resting in bed with son at bedside. He does  not awaken to verbal or tactile stimuli. Respirations are even and unlabored. He is in no distress.   Curtistine at beside states that his father has been sleeping and mostly comfortable unless staff tries to provide care. He shares concern that his father will be uncomfortable when he is transferred to hospice home. Discussed plan to make sure patient has pain and anxiety medication prior to transport for comfort. Curtistine agrees with this plan. He is tearful when discussing his father and his mother not being well enough to visit but plans to take her to hospice home once his father arrives.   Therapeutic silence and active listening provided for Curtistine to share his thoughts and emotions regarding current medical situation.  Emotional support provided.  Physical Exam Vitals and nursing note reviewed.  Constitutional:      General: He is not in acute distress.    Appearance: He is ill-appearing.  HENT:     Head: Normocephalic and atraumatic.  Pulmonary:     Effort: Pulmonary effort is normal. No respiratory distress.  Musculoskeletal:     Right lower leg: No edema.     Left lower leg: No edema.  Skin:    Coloration: Skin is pale.    Recommendations/Plan:         Focus of care is comfort and dignity allowing for natural death Utilize ordered  medications to maintain comfort Plan to transfer to [facility] hospice later today RN to medicate prior to transport     Discussed with Dr. Cosette Numbers, [facility] liaison and Leon, RN plan of care to medicate prior to transport.   I personally spent a total of 50 minutes in the care of the patient today including preparing to see the patient, getting/reviewing separately obtained history, performing a medically appropriate exam/evaluation, counseling and educating, referring and communicating with other health care professionals, documenting clinical information in the EHR, communicating results, and coordinating care.  Addendum 1216: Received message from [facility] that patient passed priro to transport. TOD 1213.   Devere Sacks, AMANDA [facility] Palliative Medicine Team  September 08, 2024 8:41 AM  Office [phone]  Pager [phone]     "

## 2024-08-23 NOTE — Discharge Summary (Addendum)
 " [facility] Phys[facility] Death Summary   Patient name: [name]  Admit date:     08/18/2024  Discharge date: 09-15-24  Attending Physician: FERNAND PROST [[id number]]  Discharge Physician: Norval Bar   PCP: [name], MD  Admitted From: Home  Disposition:  Residential Hospice  Recommendations for Outpatient Follow-up:  Not applicable  Home Health:No Equipment/Devices: @ECDMELIST @  Discharge Condition:Deceased CODE STATUS:Comfort Care Diet recommendation: Not applicable Fluid Restriction: None  Hospital Summary: 79 y.o. year old male with medical history of hypertension, hyperlipidemia complicated by CAD status post CABG, type 2 diabetes, CHF (EF 60-65%) and 02/2024, CKD 3B, and severe pulmonary hypertension presenting to the ED with increasing weakness, and dyspnea. Patient with multiple recent admissions with the most recent admission from 08/10/2024 - 08/15/2024 for acute heart failure exacerbation. States he has been weak and having difficulty with ambulation. He is also having pain in his hand along with his shoulders. He denies any URI symptoms but states his breathing has worsened since leaving the hospital. Denies any fevers or chills. On arrival to the ED patient was noted to be HDS stable. Lab work and imaging obtained. CBC with moderate leukocytosis at 16.9, hemoglobin at baseline at 8.6, thrombocytopenia that is lower than recent platelet count. CMP with moderate hyperglycemia, AKI on CKD 3B, hypercalcemia at 11.2, elevated ALP and bilirubin level. Respiratory panel negative for COVID, flu, RSV. Troponin mildly elevated but downtrending. proBNP significantly elevated from 8 days ago and has doubled since then going from 15k to 28k. Chest x-ray with diffuse interstitial prominence concerning for edema along with moderate right pleural effusion and small left pleural effusion. [facility] contacted for admission.   Hospital Course by Problem:  Recurrent acute hypoxic  respiratory failure Acute CHFpEF exacerbation  MSSA bacteremia Left hand cellulitis Neck pain  L Shoulder Pain Osteoarthritis Transaminitis Macrocytic anemia Thrombocytopenia AKI on CKD 3B  Aortic stenosis status post TAVR Advanced care planning   Patient with known severe CHFpEF admitted for fluid overload state and MSSA L hand cellulitis with bacteremia who developed AKI on CKD with aggressive diuresis. Patient continues to have poor prognosis with ongoing respiratory distress and intractable acute on chronic pains. He remains a poor candidate for aggressive therapies and diagnostic procedures given his tenuous respiratory status. Patient not improving on IV cefazolin .    Discussed poor prognosis with patient's son at bedside. Patient unable to participate in advance care talks at present as he is very sleepy at present. Son understands the severity of illness and poor prognosis. Son states clearly that the patient overnight was clear in his wishes to transition to comfort care measures only as he has not been improving. Son reports the patient called his wife and other family members overnight to say hig goodbyes and reported to them all that he wants to transition to comfort measures only at this time. Will transition to comfort care measures only at this time. Comfort care form signed and given to nursing staff. Nursing, and Palliative care team updated and also evaluated the patient. Hospice team engaged by palliative care team. Patient passed away before being discharged to hospice home to be able to see his wife.  Discharge Diagnoses:  Principal Problem:   Failure to thrive in adult Active Problems:   Acute heart failure with preserved ejection fraction (HCC)   Acute kidney injury superimposed on stage 3b chronic kidney disease (HCC)   CAD (coronary artery disease), s/p CABG x 3   Paroxysmal atrial fibrillation (HCC)  Anemia in chronic kidney disease   T2DM (type 2 diabetes  mellitus) (HCC)   Essential hypertension   OSA (obstructive sleep apnea)   Hyperlipidemia associated with type 2 diabetes mellitus (HCC)   Moderate mitral regurgitation   Bacteremia due to methicillin susceptible Staphylococcus aureus (MSSA)   History of transcatheter aortic valve replacement (TAVR)   Discharge Instructions  Discharge Instructions     Discharge wound care:   Complete by: As directed    Cleanse R buttock wound with NS, apply a small piece of silver hydrofiber Soila [phone]) cut to fit wound bed daily and secure with silicone foam for comfort only      Allergies as of 09-08-2024   No Known Allergies      Medication List     STOP taking these medications    amLODipine  10 MG tablet Commonly known as: NORVASC    aspirin  EC 81 MG tablet   atorvastatin  80 MG tablet Commonly known as: LIPITOR    bisacodyl  5 MG EC tablet Commonly known as: DULCOLAX   carvedilol  3.125 MG tablet Commonly known as: COREG    cyanocobalamin  1000 MCG tablet Commonly known as: VITAMIN B12   dexlansoprazole 60 MG capsule Commonly known as: DEXILANT   Iron  325 (65 Fe) MG Tabs   isosorbide  mononitrate 120 MG 24 hr tablet Commonly known as: IMDUR    Jardiance  10 MG Tabs tablet Generic drug: empagliflozin    lactulose  10 GM/15ML solution Commonly known as: CHRONULAC    nitroGLYCERIN  0.4 MG SL tablet Commonly known as: NITROSTAT    torsemide  20 MG tablet Commonly known as: DEMADEX                Discharge Care Instructions  (From admission, onward)           Start     Ordered   09/08/2024 0000  Discharge wound care:       Comments: Cleanse R buttock wound with NS, apply a small piece of silver hydrofiber Soila [phone]) cut to fit wound bed daily and secure with silicone foam for comfort only   2024-09-08 1127            Follow-up Information     Paraschos, Alexander, MD. Go in 1 week(s).   Specialty: Cardiology Contact information: [address]  [address] [facility] [place] [address] [phone]                Allergies[1]  Discharge Exam: Vitals:   08/22/24 2109 September 08, 2024 0905  BP: 126/66 (!) 125/51  Pulse: 80 73  Resp: 18 18  Temp: 98 F (36.7 C) 98.7 F (37.1 C)  SpO2: 91% 90%    Physical Exam Vitals and nursing note reviewed.  Constitutional:      General: He is not in acute distress.    Appearance: He is ill-appearing.     Comments: Somnolent, Weak, frail  Cardiovascular:     Rate and Rhythm: Normal rate and regular rhythm.     Pulses: Normal pulses.     Heart sounds: Normal heart sounds.  Pulmonary:     Effort: Pulmonary effort is normal. No respiratory distress.     Breath sounds: Normal breath sounds. No wheezing.  Abdominal:     General: Bowel sounds are normal.     Palpations: Abdomen is soft.     The results of significant diagnostics from this hospitalization (including imaging, microbiology, ancillary and laboratory) are listed below for reference.    Microbiology: Recent Results (from the past 240 hours)  Resp  panel by RT-PCR (RSV, Flu A&B, Covid) Anterior Nasal Swab     Status: None   Collection Time: 08/18/24  4:41 PM   Specimen: Anterior Nasal Swab  Result Value Ref Range Status   SARS Coronavirus 2 by RT PCR NEGATIVE NEGATIVE Final    Comment: (NOTE) SARS-CoV-2 target nucleic acids are NOT DETECTED.  The SARS-CoV-2 RNA is generally detectable in upper respiratory specimens during the acute phase of infection. The lowest concentration of SARS-CoV-2 viral copies this assay can detect is 138 copies/mL. A negative result does not preclude SARS-Cov-2 infection and should not be used as the sole basis for treatment or other patient management decisions. A negative result may occur with  improper specimen collection/handling, submission of specimen other than nasopharyngeal swab, presence of viral mutation(s) within the areas targeted by this assay, and inadequate  number of viral copies(<138 copies/mL). A negative result must be combined with clinical observations, patient history, and epidemiological information. The expected result is Negative.  Fact Sheet for Patients:  bloggercourse.com  Fact Sheet for Healthcare Providers:  seriousbroker.it  This test is no t yet approved or cleared by the [place]  FDA and  has been authorized for detection and/or diagnosis of SARS-CoV-2 by FDA under an Emergency Use Authorization (EUA). This EUA will remain  in effect (meaning this test can be used) for the duration of the COVID-19 declaration under Section 564(b)(1) of the Act, 21 U.S.C.section 360bbb-3(b)(1), unless the authorization is terminated  or revoked sooner.       Influenza A by PCR NEGATIVE NEGATIVE Final   Influenza B by PCR NEGATIVE NEGATIVE Final    Comment: (NOTE) The Xpert Xpress SARS-CoV-2/FLU/RSV plus assay is intended as an aid in the diagnosis of influenza from Nasopharyngeal swab specimens and should not be used as a sole basis for treatment. Nasal washings and aspirates are unacceptable for Xpert Xpress SARS-CoV-2/FLU/RSV testing.  Fact Sheet for Patients: bloggercourse.com  Fact Sheet for Healthcare Providers: seriousbroker.it  This test is not yet approved or cleared by the [place]  FDA and has been authorized for detection and/or diagnosis of SARS-CoV-2 by FDA under an Emergency Use Authorization (EUA). This EUA will remain in effect (meaning this test can be used) for the duration of the COVID-19 declaration under Section 564(b)(1) of the Act, 21 U.S.C. section 360bbb-3(b)(1), unless the authorization is terminated or revoked.     Resp Syncytial Virus by PCR NEGATIVE NEGATIVE Final    Comment: (NOTE) Fact Sheet for Patients: bloggercourse.com  Fact Sheet for Healthcare  Providers: seriousbroker.it  This test is not yet approved or cleared by the [place]  FDA and has been authorized for detection and/or diagnosis of SARS-CoV-2 by FDA under an Emergency Use Authorization (EUA). This EUA will remain in effect (meaning this test can be used) for the duration of the COVID-19 declaration under Section 564(b)(1) of the Act, 21 U.S.C. section 360bbb-3(b)(1), unless the authorization is terminated or revoked.  Performed at [facility], [address] [place], [place] [address]   Culture, blood (Routine X 2) w Reflex to ID Panel     Status: Abnormal   Collection Time: 08/18/24 10:33 PM   Specimen: BLOOD  Result Value Ref Range Status   Specimen Description   Final    BLOOD RIGHT ANTECUBITAL Performed at [facility], [address] [place], [place] [address]    Special Requests   Final    BOTTLES DRAWN AEROBIC AND ANAEROBIC Blood Culture adequate volume Performed at [facility]  [facility], [address], [place], [place] [address]    Culture  Setup Time   Final    GRAM POSITIVE COCCI IN BOTH AEROBIC AND ANAEROBIC BOTTLES CRITICAL RESULT CALLED TO, READ BACK BY AND VERIFIED WITH: ESTILL LUTES 08/19/24 0915 MW Performed at [facility], [address] [place], [place] [address]    Culture STAPHYLOCOCCUS AUREUS (A)  Final   Report Status 08/21/2024 FINAL  Final   Organism ID, Bacteria STAPHYLOCOCCUS AUREUS  Final      Susceptibility   Staphylococcus aureus - MIC*    CIPROFLOXACIN <=0.5 SENSITIVE Sensitive     ERYTHROMYCIN RESISTANT Resistant     GENTAMICIN <=0.5 SENSITIVE Sensitive     OXACILLIN <=0.25 SENSITIVE Sensitive     TETRACYCLINE >=16 RESISTANT Resistant     VANCOMYCIN 1 SENSITIVE Sensitive     TRIMETH/SULFA <=10 SENSITIVE Sensitive     CLINDAMYCIN RESISTANT Resistant     RIFAMPIN <=0.5 SENSITIVE Sensitive     Inducible Clindamycin POSITIVE Resistant     LINEZOLID 2 SENSITIVE  Sensitive     * STAPHYLOCOCCUS AUREUS  Culture, blood (Routine X 2) w Reflex to ID Panel     Status: None (Preliminary result)   Collection Time: 08/18/24 10:33 PM   Specimen: BLOOD  Result Value Ref Range Status   Specimen Description BLOOD BLOOD RIGHT ARM  Final   Special Requests   Final    BOTTLES DRAWN AEROBIC AND ANAEROBIC Blood Culture adequate volume   Culture   Final    NO GROWTH 4 DAYS Performed at [facility], [address] [place], [place] [address]    Report Status PENDING  Incomplete  Blood Culture ID Panel (Reflexed)     Status: Abnormal   Collection Time: 08/18/24 10:33 PM  Result Value Ref Range Status   Enterococcus faecalis NOT DETECTED NOT DETECTED Final   Enterococcus Faecium NOT DETECTED NOT DETECTED Final   Listeria monocytogenes NOT DETECTED NOT DETECTED Final   Staphylococcus species DETECTED (A) NOT DETECTED Final    Comment: CRITICAL RESULT CALLED TO, READ BACK BY AND VERIFIED WITH: SHEEMA HALLAJI 08/19/24 0915 MW    Staphylococcus aureus (BCID) DETECTED (A) NOT DETECTED Final    Comment: CRITICAL RESULT CALLED TO, READ BACK BY AND VERIFIED WITH: SHEEMA HALLAJI 08/19/24 0915 MW    Staphylococcus epidermidis NOT DETECTED NOT DETECTED Final   Staphylococcus lugdunensis NOT DETECTED NOT DETECTED Final   Streptococcus species NOT DETECTED NOT DETECTED Final   Streptococcus agalactiae NOT DETECTED NOT DETECTED Final   Streptococcus pneumoniae NOT DETECTED NOT DETECTED Final   Streptococcus pyogenes NOT DETECTED NOT DETECTED Final   A.calcoaceticus-baumannii NOT DETECTED NOT DETECTED Final   Bacteroides fragilis NOT DETECTED NOT DETECTED Final   Enterobacterales NOT DETECTED NOT DETECTED Final   Enterobacter cloacae complex NOT DETECTED NOT DETECTED Final   Escherichia coli NOT DETECTED NOT DETECTED Final   Klebsiella aerogenes NOT DETECTED NOT DETECTED Final   Klebsiella oxytoca NOT DETECTED NOT DETECTED Final   Klebsiella pneumoniae NOT  DETECTED NOT DETECTED Final   Proteus species NOT DETECTED NOT DETECTED Final   Salmonella species NOT DETECTED NOT DETECTED Final   Serratia marcescens NOT DETECTED NOT DETECTED Final   Haemophilus influenzae NOT DETECTED NOT DETECTED Final   Neisseria meningitidis NOT DETECTED NOT DETECTED Final   Pseudomonas aeruginosa NOT DETECTED NOT DETECTED Final   Stenotrophomonas maltophilia NOT DETECTED NOT DETECTED Final   Candida albicans NOT DETECTED NOT DETECTED Final   Candida  auris NOT DETECTED NOT DETECTED Final   Candida glabrata NOT DETECTED NOT DETECTED Final   Candida krusei NOT DETECTED NOT DETECTED Final   Candida parapsilosis NOT DETECTED NOT DETECTED Final   Candida tropicalis NOT DETECTED NOT DETECTED Final   Cryptococcus neoformans/gattii NOT DETECTED NOT DETECTED Final   Meth resistant mecA/C and MREJ NOT DETECTED NOT DETECTED Final    Comment: Performed at [facility], [address] [place], [place] [address]  Culture, blood (Routine X 2) w Reflex to ID Panel     Status: None (Preliminary result)   Collection Time: 08/19/24 11:34 AM   Specimen: BLOOD  Result Value Ref Range Status   Specimen Description BLOOD BLOOD RIGHT WRIST  Final   Special Requests   Final    BOTTLES DRAWN AEROBIC AND ANAEROBIC Blood Culture adequate volume   Culture   Final    NO GROWTH 4 DAYS Performed at [facility], [address] [place], [place] [address]    Report Status PENDING  Incomplete  Culture, blood (Routine X 2) w Reflex to ID Panel     Status: None (Preliminary result)   Collection Time: 08/21/24  4:48 AM   Specimen: BLOOD  Result Value Ref Range Status   Specimen Description BLOOD BLOOD RIGHT WRIST  Final   Special Requests   Final    BOTTLES DRAWN AEROBIC AND ANAEROBIC Blood Culture adequate volume   Culture   Final    NO GROWTH 2 DAYS Performed at [facility], [address] [place], [place] [address]    Report Status PENDING  Incomplete   Culture, blood (Routine X 2) w Reflex to ID Panel     Status: None (Preliminary result)   Collection Time: 08/21/24  4:48 AM   Specimen: BLOOD  Result Value Ref Range Status   Specimen Description BLOOD BLOOD RIGHT HAND  Final   Special Requests   Final    BOTTLES DRAWN AEROBIC AND ANAEROBIC Blood Culture adequate volume   Culture   Final    NO GROWTH 2 DAYS Performed at [facility], [address] [place], [place] [address]    Report Status PENDING  Incomplete     Labs: ProBNP, BNP (last 5 results) Recent Labs    10/16/23 1415 03/02/24 1314 07/01/24 1220 07/18/24 1707 08/10/24 0732 08/18/24 1641  PROBNP  --   --  12,167.0* 13,853.0* 15,340.0* 28,444.0*  BNP 942.6* 1,183.2*  --   --   --   --    Basic Metabolic Panel: Recent Labs  Lab 08/18/24 1641 08/19/24 0425 08/20/24 0420 08/21/24 0448 08/22/24 0552  NA 136 136 135 133* 134*  K 4.9 4.8 4.8 4.4 4.5  CL 94* 94* 93* 94* 95*  CO2 30 32 30 30 31   GLUCOSE 203* 211* 206* 207* 180*  BUN 69* 76* 88* 95* 93*  CREATININE 2.53* 2.66* 3.05* 3.07* 2.85*  CALCIUM  11.2* 10.4* 9.9 9.1 9.6  MG 2.0  --   --   --   --    Liver Function Tests: Recent Labs  Lab 08/18/24 1641 08/19/24 0425 08/22/24 0552  AST 31 23 16   ALT 18 15 <5  ALKPHOS 134* 115 112  BILITOT 2.5* 1.9* 1.3*  PROT 7.4 6.6 6.4*  ALBUMIN 3.4* 3.0* 2.7*   No results for input(s): LIPASE, AMYLASE in the last 168 hours. No results for input(s): AMMONIA in the last 168 hours. CBC: Recent Labs  Lab 08/18/24 1641 08/19/24 0425 08/20/24 9579  08/21/24 0448 08/22/24 0552  WBC 16.9* 14.8* 11.2* 8.8 9.9  NEUTROABS 15.7*  --   --   --   --   HGB 8.6* 8.5* 7.2* 7.5* 8.5*  HCT 27.4* 27.1* 22.9* 23.0* 25.8*  MCV 100.0 100.4* 98.7 95.4 94.5  PLT 86* 71* 78* 78* 100*   Cardiac Enzymes: No results for input(s): CKTOTAL, CKMB, CKMBINDEX, TROPONINI, TROPONINIHS in the last 168 hours. BNP: No results for input(s): BNP in the last  168 hours. CBG: Recent Labs  Lab 08/21/24 0734 08/21/24 1201 08/21/24 1627 08/21/24 2020 08/22/24 0809  GLUCAP 179* 131* 203* 167* 170*   D-Dimer No results for input(s): DDIMER in the last 72 hours. Hgb A1c No results for input(s): HGBA1C in the last 72 hours. Lipid Profile No results for input(s): CHOL, HDL, LDLCALC, TRIG, CHOLHDL, LDLDIRECT in the last 72 hours. Thyroid  function studies No results for input(s): TSH, T4TOTAL, FREET4, T3FREE, THYROIDAB in the last 72 hours.  Invalid input(s): FREET3 Anemia work up No results for input(s): VITAMINB12, FOLATE, FERRITIN, TIBC, IRON , RETICCTPCT in the last 72 hours. Urinalysis    Component Value Date/Time   COLORURINE YELLOW (A) 08/11/2024 0521   APPEARANCEUR CLEAR (A) 08/11/2024 0521   LABSPEC 1.011 08/11/2024 0521   PHURINE 5.0 08/11/2024 0521   GLUCOSEU 150 (A) 08/11/2024 0521   HGBUR MODERATE (A) 08/11/2024 0521   BILIRUBINUR NEGATIVE 08/11/2024 0521   KETONESUR NEGATIVE 08/11/2024 0521   PROTEINUR 100 (A) 08/11/2024 0521   NITRITE NEGATIVE 08/11/2024 0521   LEUKOCYTESUR TRACE (A) 08/11/2024 0521   Sepsis Labs Recent Labs  Lab 08/19/24 0425 08/20/24 0420 08/21/24 0448 08/22/24 0552  WBC 14.8* 11.2* 8.8 9.9    Procedures/Studies: CT SOFT TISSUE NECK WO CONTRAST Result Date: 08/21/2024 EXAM: CT NECK WITHOUT CONTRAST 08/21/2024 03:19:00 PM TECHNIQUE: CT of the neck was performed without the administration of intravenous contrast. Multiplanar reformatted images are provided for review. Automated exposure control, iterative reconstruction, and/or weight based adjustment of the mA/kV was utilized to reduce the radiation dose to as low as reasonably achievable. COMPARISON: CT chest abdomen and pelvis dated 07/20/2024. CLINICAL HISTORY: Soft tissue infection suspected, neck, no prior imaging. FINDINGS: AERODIGESTIVE TRACT: No discrete mass. No edema. SALIVARY GLANDS: The parotid and  submandibular glands are unremarkable. THYROID : Hypodense right thyroid  nodule measuring 1.9 cm. LYMPH NODES: No suspicious cervical lymphadenopathy. SOFT TISSUES: No mass or fluid collection. BONES: No abnormality. OTHER: Visualized sinuses and mastoid air cells are well aerated. Pleural thickening and loculated pleural fluid at the left lung apex worsened from 07/20/2024 CT chest abdomen and pelvis. Calcific aortic atherosclerosis. IMPRESSION: 1. No acute findings. 2. Interval worsening of pleural thickening and loculated pleural fluid at the left lung apex. 3. Hypodense right thyroid  nodule measuring 1.9 cm, for which non-emergent thyroid  ultrasound is recommended. Electronically signed by: Franky Stanford MD MD 08/21/2024 08:30 PM EST RP Workstation: [id number]   CT HEAD WO CONTRAST ( ) Result Date: 08/21/2024 EXAM: CT HEAD WITHOUT CONTRAST 08/21/2024 03:19:00 PM TECHNIQUE: CT of the head was performed without the administration of intravenous contrast. Automated exposure control, iterative reconstruction, and/or weight based adjustment of the mA/kV was utilized to reduce the radiation dose to as low as reasonably achievable. COMPARISON: None available. CLINICAL HISTORY: Headache, MSSA bacteremia FINDINGS: BRAIN AND VENTRICLES: No acute hemorrhage. No evidence of acute infarct. No hydrocephalus. No extra-axial collection. No mass effect or midline shift. Atherosclerotic calcifications of internal carotid and vertebral arteries at the skull base. ORBITS: No acute abnormality. SINUSES: Mucous  retention cyst in right maxillary sinus. Mild mucosal thickening in frontal sinus and ethmoid air cells. SOFT TISSUES AND SKULL: No acute soft tissue abnormality. No skull fracture. IMPRESSION: 1. No acute intracranial abnormality. 2. Paranasal sinus disease with right maxillary mucous retention cyst and mild frontal and ethmoid mucosal thickening. Electronically signed by: Franky Stanford MD MD 08/21/2024 08:24 PM EST RP  Workstation: [id number]   US  RENAL Result Date: 08/21/2024 EXAM: RETROPERITONEAL ULTRASOUND OF THE KIDNEYS 08/21/2024 09:37:44 AM TECHNIQUE: Real-time ultrasonography of the retroperitoneum, specifically the kidneys and urinary bladder, was performed. COMPARISON: US  Renal 05/14/2023. CLINICAL HISTORY: AKI (acute kidney injury). FINDINGS: RIGHT KIDNEY: Right kidney measures 13.0 x 6.0 x 5.0 cm. Normal cortical echogenicity. No hydronephrosis. No calculus. Right renal cysts measuring up to 3.2 x 3.2 x 3.2 cm. Trace perirenal free fluid. LEFT KIDNEY: Left kidney measures 15.0 x 6.0 x 4.0 cm. Normal cortical echogenicity. No hydronephrosis. Likely nonobstructing stone in the left kidney. Left-sided renal cysts measuring up to 4.2 x 4.1 x 2.3 cm. BLADDER: Pre-void Bladder volume is 189 cc. Post-void Bladder volume is 165 cc. Layering debris in the bladder. GALLBLADDER: Sludge in the partially visualized gallbladder. IMPRESSION: 1. Likely nonobstructing stone in the left kidney. No hydronephrosis. 2. Mild diffuse bladder wall thickening and persistent postvoid residual, suggestive of possible bladder outlet obstruction. Electronically signed by: Michaeline Blanch MD 08/21/2024 04:32 PM EST RP Workstation: [id number]   ECHOCARDIOGRAM COMPLETE Result Date: 08/19/2024    ECHOCARDIOGRAM REPORT   Patient Name:   YISHAI REHFELD [facility] Date of Exam: 08/18/2024 Medical Rec #:  [id number]        Height:       73.0 in Accession #:    [id number]       Weight:       190.0 lb Date of Birth:  08/01/1946         BSA:          2.105 m Patient Age:    78 years         BP:           124/76 mmHg Patient Gender: M                HR:           115 bpm. Exam Location:  [facility] Procedure: 2D Echo, Cardiac Doppler and Color Doppler (Both Spectral and Color            Flow Doppler were utilized during procedure). Indications:     I50.21 Acute Systolic CHF  History:         Patient has prior history of Echocardiogram examinations, most                  recent  03/03/2024. CHF, CAD, Prior CABG and TAVR-01/23/24,                  Arrythmias:LBBB, RBBB and Atrial Fibrillation,                  Signs/Symptoms:Murmur and Dyspnea; Risk Factors:Hypertension,                  Diabetes and Dyslipidemia.  Sonographer:     Carl Coma RDCS Referring Phys:  [id number] MORENE BATHE Diagnosing Phys: Dwayne D Callwood MD IMPRESSIONS  1. Left ventricular ejection fraction, by estimation, is 60 to 65%. The left ventricle has normal function. The left ventricle has no regional wall motion abnormalities. There is severe concentric left ventricular hypertrophy. Left ventricular  diastolic  parameters are consistent with Grade III diastolic dysfunction (restrictive). There is the interventricular septum is flattened in diastole ('D' shaped left ventricle), consistent with right ventricular volume overload.  2. Right ventricular systolic function is moderately reduced. The right ventricular size is moderately enlarged. Mildly increased right ventricular wall thickness.  3. Left atrial size was severely dilated.  4. Right atrial size was moderately dilated.  5. The mitral valve is grossly normal. Moderate mitral valve regurgitation. Moderate mitral annular calcification.  6. Tricuspid valve regurgitation is moderate to severe.  7. The aortic valve is grossly normal. Aortic valve regurgitation is mild. Aortic valve sclerosis/calcification is present, without any evidence of aortic stenosis. 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 FINDINGS  Left Ventricle: Left ventricular ejection fraction, by estimation, is 60 to 65%. The left ventricle has normal function. The left ventricle has no regional wall motion abnormalities. Strain was performed and the global longitudinal strain is indeterminate. The left ventricular internal cavity size was normal in size. There is severe concentric left ventricular hypertrophy. The interventricular septum  is flattened in diastole ('D' shaped left ventricle), consistent with right ventricular volume overload. Left ventricular diastolic parameters are consistent with Grade III diastolic dysfunction (restrictive). Right Ventricle: The right ventricular size is moderately enlarged. Mildly increased right ventricular wall thickness. Right ventricular systolic function is moderately reduced. Left Atrium: Left atrial size was severely dilated. Right Atrium: Right atrial size was moderately dilated. Pericardium: There is no evidence of pericardial effusion. Mitral Valve: The mitral valve is grossly normal. There is moderate thickening of the mitral valve leaflet(s). There is moderate calcification of the mitral valve leaflet(s). Moderate mitral annular calcification. Moderate mitral valve regurgitation. Tricuspid Valve: The tricuspid valve is grossly normal. Tricuspid valve regurgitation is moderate to severe. The aortic valve is grossly normal. Aortic valve regurgitation is mild. Aortic valve sclerosis/calcification is present, without any evidence of aortic stenosis. 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 Pulmonic Valve: The pulmonic valve was normal in structure. Pulmonic valve regurgitation is not visualized. Aorta: The aortic root was not well visualized. IAS/Shunts: No atrial level shunt detected by color flow Doppler. Additional Comments: 3D was performed not requiring image post processing on an independent workstation and was indeterminate.  LEFT VENTRICLE PLAX 2D LVIDd:         4.50 cm LVIDs:         3.20 cm LV PW:         1.80 cm LV IVS:        1.80 cm  RIGHT VENTRICLE            IVC RV Basal diam:  4.90 cm    IVC diam: 2.10 cm RV S prime:     6.74 cm/s TAPSE (M-mode): 1.2 cm LEFT ATRIUM             Index        RIGHT ATRIUM           Index LA diam:        5.30 cm 2.52 cm/m   RA Area:     28.30  cm LA Vol (A2C):   94.4 ml 44.84 ml/m  RA Volume:   102.00 ml 48.45 ml/m LA Vol (A4C):   92.0 ml 43.70 ml/m LA Biplane Vol: 97.0 ml 46.08 ml/m  AORTIC VALVE AV Vmax:  302.00 cm/s AV Vmean:          212.200 cm/s AV VTI:            0.447 m AV Peak Grad:      36.5 mmHg AV Mean Grad:      20.6 mmHg LVOT Vmax:         81.03 cm/s LVOT Vmean:        52.067 cm/s LVOT VTI:          0.135 m LVOT/AV VTI ratio: 0.30 AI PHT:            277 msec  AORTA Ao Root diam: 3.40 cm Ao Asc diam:  3.90 cm MR Peak grad: 112.8 mmHg    TRICUSPID VALVE MR Vmax:      531.00 cm/s   TR Peak grad:   58.7 mmHg MV E velocity: 163.25 cm/s  TR Vmax:        383.00 cm/s                              SHUNTS                             Systemic VTI: 0.14 m Cara JONETTA Lovelace MD Electronically signed by Cara JONETTA Lovelace MD Signature Date/Time: 08/19/2024/12:52:25 PM    Final    DG Hand Complete Left Result Date: 08/19/2024 EXAM: 3 OR MORE VIEW(S) XRAY OF THE LEFT HAND 08/19/2024 12:49:00 AM COMPARISON: 08/18/2024. CLINICAL HISTORY: Swelling. FINDINGS: BONES AND JOINTS: No acute fracture, subluxation or dislocation. No malalignment. Calcifications about the left wrist, likely synovial calcifications. Arthritic changes throughout the left wrist. SOFT TISSUES: Small radiopaque foreign body noted in the soft tissues of the left middle finger anterior to the middle phalanx. IMPRESSION: 1. Small radiopaque foreign body in the soft tissues of the left middle finger anterior to the middle phalanx. 2. No acute fracture, subluxation, or dislocation. Electronically signed by: Franky Crease MD 08/19/2024 01:00 AM EST RP Workstation: [id number]   US  Venous Img Upper Uni Left (DVT) Result Date: 08/19/2024 CLINICAL DATA:  Swelling EXAM: Left UPPER EXTREMITY VENOUS DOPPLER ULTRASOUND TECHNIQUE: Gray-scale sonography with graded compression, as well as color Doppler and duplex ultrasound were performed to evaluate the upper extremity deep venous system from  the level of the subclavian vein and including the jugular, axillary, basilic, radial, ulnar and upper cephalic vein. Spectral Doppler was utilized to evaluate flow at rest and with distal augmentation maneuvers. COMPARISON:  None Available. FINDINGS: Contralateral Subclavian Vein: Respiratory phasicity is normal and symmetric with the symptomatic side. No evidence of thrombus. Internal Jugular Vein: No evidence of thrombus. Normal compressibility, respiratory phasicity and response to augmentation. Subclavian Vein: No evidence of thrombus. Normal respiratory phasicity and response to augmentation. Axillary Vein: No evidence of thrombus. Normal compressibility, respiratory phasicity and response to augmentation. Cephalic Vein: No evidence of thrombus. Normal compressibility, respiratory phasicity and response to augmentation. Basilic Vein: No evidence of thrombus. Normal compressibility, respiratory phasicity and response to augmentation. Brachial Veins: No evidence of thrombus. Normal compressibility, respiratory phasicity and response to augmentation. Radial Veins: No evidence of thrombus. Normal compressibility, respiratory phasicity and response to augmentation. Ulnar Veins: No evidence of thrombus. Normal compressibility, respiratory phasicity and response to augmentation. IMPRESSION: No evidence of DVT within the left upper extremity. Electronically Signed   By: Luke Bun M.D.   On: 08/19/2024 00:31  DG Shoulder Left Result Date: 08/18/2024 EXAM: 1 VIEW(S) XRAY OF THE LEFT SHOULDER 08/18/2024 07:58:00 PM COMPARISON: None available. CLINICAL HISTORY: Shoulder pain, left. FINDINGS: BONES AND JOINTS: Glenohumeral joint is normally aligned. No acute fracture. No malalignment. 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 SOFT TISSUES: No abnormal calcifications. Visualized lung is unremarkable. IMPRESSION: 1. 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  Electronically signed by: Elsie Gravely MD 08/18/2024 08:12 PM EST RP Workstation: [id number]   DG Hand 2 View Left Result Date: 08/18/2024 EXAM: 1 or 2 VIEW(S) XRAY OF THE LEFT HAND 08/18/2024 07:58:00 PM COMPARISON: None available. CLINICAL HISTORY: Pain FINDINGS: BONES AND JOINTS: Prominent degenerative changes in the interphalangeal joints, first carpometacarpal and metacarpophalangeal joints, radiocarpal, radioulnar, and intercarpal joints. Old healed fracture deformity of the distal radius. Subcortical cyst in the base of the 1st metacarpal, likely degenerative cyst. SOFT TISSUES: Soft tissue calcifications in and around the wrist likely representing synovial calcifications. Dorsal soft tissue swelling. Tiny metallic foreign body suggested in the soft tissues of the third finger adjacent to the middle phalanx. IMPRESSION: 1. Dorsal soft tissue swelling. 2. Tiny metallic foreign body suggested in the soft tissues of the third finger adjacent to the middle phalanx. 3. Prominent degenerative changes in the interphalangeal joints, first carpometacarpal and metacarpophalangeal joints, radiocarpal, radioulnar, and intercarpal joints. 4. Soft tissue calcifications in and around the wrist, likely representing synovial calcifications. 5. Old healed fracture deformity of the distal radius. Electronically signed by: Elsie Gravely MD 08/18/2024 08:11 PM EST RP Workstation: [id number]   DG Chest Portable 1 View Result Date: 08/18/2024 EXAM: 1 VIEW(S) XRAY OF THE CHEST 08/18/2024 04:53:00 PM COMPARISON: 08/10/2024 CLINICAL HISTORY: SOB FINDINGS: LUNGS AND PLEURA: Moderate right pleural effusion, stable compared to prior. Small left pleural effusion. Mild diffuse interstitial prominence. Bibasilar airspace opacities, likely compressive atelectasis. No pneumothorax. HEART AND MEDIASTINUM: Enlarged cardiomediastinal silhouette, unchanged. Median sternotomy and TAVR noted. Atherosclerotic calcifications. BONES AND SOFT  TISSUES: No acute osseous abnormality. IMPRESSION: 1. Moderate right pleural effusion and small left pleural effusion. 2. Mild diffuse interstitial prominence and bibasilar airspace opacities, likely compressive atelectasis. 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 LUNGS AND PLEURA: Mild pulmonary edema, unchanged. Stable small to moderate bilateral pleural effusions. Unchanged bibasilar opacities. No pneumothorax. HEART AND MEDIASTINUM: Stable cardiomegaly. Aortic atherosclerosis. BONES AND SOFT TISSUES: Median sternotomy wires noted. No acute osseous abnormality. IMPRESSION: 1. Mild pulmonary edema, unchanged. 2. Stable small to moderate bilateral pleural effusions. Electronically signed by: Greig Pique MD 08/10/2024 06:13 PM EST RP Workstation: [id number]   US  THORACENTESIS ASP PLEURAL SPACE W/IMG GUIDE Result Date: 08/10/2024 INDICATION: 79 year old male with an extensive cardiac history who presented to the ED today with worsening exertional chest pain and dyspnea on exertion. Workup consistent with heart failure exacerbation and imaging concerning for moderate bilateral pleural effusions with mild pulmonary edema and bibasilar atelectasis. Request for diagnostic and therapeutic thoracentesis. EXAM: ULTRASOUND GUIDED DIAGNOSTIC AND THERAPEUTIC, LEFT-SIDED THORACENTESIS MEDICATIONS: 1% lidocaine , 6 mL. COMPLICATIONS: None immediate. PROCEDURE: An ultrasound guided thoracentesis was thoroughly discussed with the patient and questions answered. The benefits, risks, alternatives and complications were also discussed. The patient understands and wishes to proceed with the procedure. Written consent was obtained. Ultrasound was  performed to localize and  mark an adequate pocket of fluid in the left chest. The area was then prepped and draped in the normal sterile fashion. 1% Lidocaine  was used for local anesthesia. Under ultrasound guidance a 6 Fr Safe-T-Centesis catheter was introduced. Thoracentesis was performed. The catheter was removed and a dressing applied. FINDINGS: A total of approximately 700 mL of serous sanguinous fluid was removed. Samples were sent to the laboratory as requested by the clinical team. IMPRESSION: Successful ultrasound guided left-sided thoracentesis yielding 700 mL of pleural fluid. Procedure performed by: Sherrilee Bal, PA-C under the supervision of Dr. VEAR Lent Electronically Signed   By: Wilkie Lent M.D.   On: 08/10/2024 16:38   DG Chest 2 View Result Date: 08/10/2024 EXAM: 2 VIEW(S) XRAY OF THE CHEST 08/10/2024 08:36:00 AM COMPARISON: 08/01/2024 CLINICAL HISTORY: dyspnea, cp FINDINGS: LUNGS AND PLEURA: Moderate bilateral pleural effusions. Mild pulmonary edema. Bibasilar atelectasis/airspace disease. No pneumothorax. HEART AND MEDIASTINUM: Stable CABG markers. TAVR noted. Aortic atherosclerosis. BONES AND SOFT TISSUES: Sternotomy wires noted. No acute osseous abnormality. IMPRESSION: 1. Moderate bilateral pleural effusions with mild pulmonary edema and bibasilar atelectasis/airspace disease. Electronically signed by: Waddell Calk MD 08/10/2024 08:41 AM EST RP Workstation: [id number]   DG Chest 1 View Result Date: 08/01/2024 EXAM: 1 VIEW(S) XRAY OF THE CHEST 08/01/2024 09:32:09 AM COMPARISON: 07/20/2024 CLINICAL HISTORY: SOB (shortness of breath) FINDINGS: LUNGS AND PLEURA: Bibasilar airspace opacities are stable. Mild pulmonary edema is present. Moderate right pleural effusion, slightly decreased. Similar small left pleural effusion. No pneumothorax. HEART AND MEDIASTINUM: Stable cardiomegaly. Aortic atherosclerotic calcification. Aortic valve replacement is noted. BONES AND SOFT  TISSUES: Median sternotomy is noted. No acute osseous abnormality. IMPRESSION: 1. Moderate right pleural effusion, slightly decreased, and similar small left pleural effusion. 2. Stable bibasilar airspace opacities and mild pulmonary edema. 3. Stable cardiomegaly and aortic atherosclerotic calcification. 4. Postsurgical changes of median sternotomy and aortic valve replacement. Electronically signed by: Evalene Coho MD 08/01/2024 09:37 AM EST RP Workstation: [id number]    Time coordinating discharge: 40 mins  SIGNED:  Norval Bar, MD [facility] 09/05/2024, 11:27 AM     [1] No Known Allergies  "

## 2024-08-23 NOTE — Progress Notes (Addendum)
 Patient unable to make it to [facility], inpatient hospice, called and updated nurse at facility. Time of death 12:13. Double nurse check with Amy charge nurse. Son and daughter in law at bedside.Clinical adm. Made aware. [facility] contacted, Medical examiner contacted and [facility] home in notes. Post mortuum care done.

## 2024-08-24 ENCOUNTER — Encounter: Payer: Self-pay | Attending: Internal Medicine

## 2024-08-24 LAB — CULTURE, BLOOD (ROUTINE X 2)
Culture: NO GROWTH
Culture: NO GROWTH
Special Requests: ADEQUATE
Special Requests: ADEQUATE

## 2024-08-24 SURGERY — ECHOCARDIOGRAM, TRANSESOPHAGEAL
Anesthesia: General

## 2024-08-25 ENCOUNTER — Inpatient Hospital Stay: Attending: Oncology

## 2024-08-25 ENCOUNTER — Inpatient Hospital Stay

## 2024-08-26 LAB — CULTURE, BLOOD (ROUTINE X 2)
Culture: NO GROWTH
Culture: NO GROWTH
Special Requests: ADEQUATE
Special Requests: ADEQUATE

## 2024-09-13 DEATH — deceased

## 2024-09-15 ENCOUNTER — Inpatient Hospital Stay

## 2024-10-07 ENCOUNTER — Inpatient Hospital Stay

## 2024-10-07 ENCOUNTER — Inpatient Hospital Stay: Admitting: Oncology
